# Patient Record
Sex: Male | Born: 1970
Health system: Southern US, Community
[De-identification: ages and names within clinical notes are randomized; demographics above are authoritative.]

## PROBLEM LIST (undated history)

## (undated) DIAGNOSIS — R519 Headache, unspecified: Secondary | ICD-10-CM

## (undated) DIAGNOSIS — I480 Paroxysmal atrial fibrillation: Secondary | ICD-10-CM

## (undated) DIAGNOSIS — G629 Polyneuropathy, unspecified: Secondary | ICD-10-CM

## (undated) DIAGNOSIS — T148XXA Other injury of unspecified body region, initial encounter: Secondary | ICD-10-CM

## (undated) DIAGNOSIS — M1990 Unspecified osteoarthritis, unspecified site: Secondary | ICD-10-CM

## (undated) DIAGNOSIS — I7100 Dissection of unspecified site of aorta: Secondary | ICD-10-CM

## (undated) DIAGNOSIS — I10 Essential (primary) hypertension: Secondary | ICD-10-CM

## (undated) DIAGNOSIS — N186 End stage renal disease: Secondary | ICD-10-CM

## (undated) DIAGNOSIS — G839 Paralytic syndrome, unspecified: Secondary | ICD-10-CM

## (undated) DIAGNOSIS — H5410 Blindness, one eye, low vision other eye, unspecified eyes: Secondary | ICD-10-CM

## (undated) DIAGNOSIS — Z9889 Other specified postprocedural states: Secondary | ICD-10-CM

## (undated) DIAGNOSIS — R51 Headache: Secondary | ICD-10-CM

## (undated) DIAGNOSIS — J189 Pneumonia, unspecified organism: Secondary | ICD-10-CM

## (undated) HISTORY — DX: End stage renal disease: N18.6

## (undated) HISTORY — PX: AV FISTULA PLACEMENT: SHX1204

## (undated) HISTORY — DX: Blindness, one eye, low vision other eye, unspecified eyes: H54.10

## (undated) HISTORY — PX: REPAIR OF ACUTE ASCENDING THORACIC AORTIC DISSECTION: SHX6323

## (undated) HISTORY — DX: Essential (primary) hypertension: I10

---

## 2002-04-04 DIAGNOSIS — I7100 Dissection of unspecified site of aorta: Secondary | ICD-10-CM

## 2002-04-04 HISTORY — DX: Dissection of unspecified site of aorta: I71.00

## 2012-04-04 DIAGNOSIS — Z9289 Personal history of other medical treatment: Secondary | ICD-10-CM

## 2012-04-04 DIAGNOSIS — Z9889 Other specified postprocedural states: Secondary | ICD-10-CM

## 2012-04-04 HISTORY — DX: Personal history of other medical treatment: Z92.89

## 2012-04-04 HISTORY — DX: Other specified postprocedural states: Z98.890

## 2014-04-04 DIAGNOSIS — N186 End stage renal disease: Secondary | ICD-10-CM | POA: Diagnosis not present

## 2014-04-08 DIAGNOSIS — N2581 Secondary hyperparathyroidism of renal origin: Secondary | ICD-10-CM | POA: Diagnosis not present

## 2014-04-08 DIAGNOSIS — Z23 Encounter for immunization: Secondary | ICD-10-CM | POA: Diagnosis not present

## 2014-04-08 DIAGNOSIS — D631 Anemia in chronic kidney disease: Secondary | ICD-10-CM | POA: Diagnosis not present

## 2014-04-08 DIAGNOSIS — N186 End stage renal disease: Secondary | ICD-10-CM | POA: Diagnosis not present

## 2014-04-08 DIAGNOSIS — D509 Iron deficiency anemia, unspecified: Secondary | ICD-10-CM | POA: Diagnosis not present

## 2014-04-12 DIAGNOSIS — D509 Iron deficiency anemia, unspecified: Secondary | ICD-10-CM | POA: Diagnosis not present

## 2014-04-12 DIAGNOSIS — D631 Anemia in chronic kidney disease: Secondary | ICD-10-CM | POA: Diagnosis not present

## 2014-04-12 DIAGNOSIS — Z23 Encounter for immunization: Secondary | ICD-10-CM | POA: Diagnosis not present

## 2014-04-12 DIAGNOSIS — N186 End stage renal disease: Secondary | ICD-10-CM | POA: Diagnosis not present

## 2014-04-12 DIAGNOSIS — D689 Coagulation defect, unspecified: Secondary | ICD-10-CM | POA: Diagnosis not present

## 2014-04-12 DIAGNOSIS — N2581 Secondary hyperparathyroidism of renal origin: Secondary | ICD-10-CM | POA: Diagnosis not present

## 2014-04-17 DIAGNOSIS — N186 End stage renal disease: Secondary | ICD-10-CM | POA: Diagnosis not present

## 2014-04-17 DIAGNOSIS — D631 Anemia in chronic kidney disease: Secondary | ICD-10-CM | POA: Diagnosis not present

## 2014-04-17 DIAGNOSIS — N2581 Secondary hyperparathyroidism of renal origin: Secondary | ICD-10-CM | POA: Diagnosis not present

## 2014-04-17 DIAGNOSIS — Z23 Encounter for immunization: Secondary | ICD-10-CM | POA: Diagnosis not present

## 2014-04-17 DIAGNOSIS — D509 Iron deficiency anemia, unspecified: Secondary | ICD-10-CM | POA: Diagnosis not present

## 2014-04-19 DIAGNOSIS — N186 End stage renal disease: Secondary | ICD-10-CM | POA: Diagnosis not present

## 2014-04-19 DIAGNOSIS — Z23 Encounter for immunization: Secondary | ICD-10-CM | POA: Diagnosis not present

## 2014-04-19 DIAGNOSIS — D631 Anemia in chronic kidney disease: Secondary | ICD-10-CM | POA: Diagnosis not present

## 2014-04-19 DIAGNOSIS — N2581 Secondary hyperparathyroidism of renal origin: Secondary | ICD-10-CM | POA: Diagnosis not present

## 2014-04-19 DIAGNOSIS — D509 Iron deficiency anemia, unspecified: Secondary | ICD-10-CM | POA: Diagnosis not present

## 2014-04-29 DIAGNOSIS — D509 Iron deficiency anemia, unspecified: Secondary | ICD-10-CM | POA: Diagnosis not present

## 2014-04-29 DIAGNOSIS — N2581 Secondary hyperparathyroidism of renal origin: Secondary | ICD-10-CM | POA: Diagnosis not present

## 2014-04-29 DIAGNOSIS — D631 Anemia in chronic kidney disease: Secondary | ICD-10-CM | POA: Diagnosis not present

## 2014-04-29 DIAGNOSIS — Z23 Encounter for immunization: Secondary | ICD-10-CM | POA: Diagnosis not present

## 2014-04-29 DIAGNOSIS — N186 End stage renal disease: Secondary | ICD-10-CM | POA: Diagnosis not present

## 2014-05-03 DIAGNOSIS — N2581 Secondary hyperparathyroidism of renal origin: Secondary | ICD-10-CM | POA: Diagnosis not present

## 2014-05-03 DIAGNOSIS — Z23 Encounter for immunization: Secondary | ICD-10-CM | POA: Diagnosis not present

## 2014-05-03 DIAGNOSIS — N186 End stage renal disease: Secondary | ICD-10-CM | POA: Diagnosis not present

## 2014-05-03 DIAGNOSIS — D509 Iron deficiency anemia, unspecified: Secondary | ICD-10-CM | POA: Diagnosis not present

## 2014-05-03 DIAGNOSIS — D631 Anemia in chronic kidney disease: Secondary | ICD-10-CM | POA: Diagnosis not present

## 2014-05-08 DIAGNOSIS — D509 Iron deficiency anemia, unspecified: Secondary | ICD-10-CM | POA: Diagnosis not present

## 2014-05-08 DIAGNOSIS — N2581 Secondary hyperparathyroidism of renal origin: Secondary | ICD-10-CM | POA: Diagnosis not present

## 2014-05-08 DIAGNOSIS — D631 Anemia in chronic kidney disease: Secondary | ICD-10-CM | POA: Diagnosis not present

## 2014-05-08 DIAGNOSIS — N186 End stage renal disease: Secondary | ICD-10-CM | POA: Diagnosis not present

## 2014-05-08 DIAGNOSIS — D688 Other specified coagulation defects: Secondary | ICD-10-CM | POA: Diagnosis not present

## 2014-05-10 DIAGNOSIS — D509 Iron deficiency anemia, unspecified: Secondary | ICD-10-CM | POA: Diagnosis not present

## 2014-05-10 DIAGNOSIS — N2581 Secondary hyperparathyroidism of renal origin: Secondary | ICD-10-CM | POA: Diagnosis not present

## 2014-05-10 DIAGNOSIS — N186 End stage renal disease: Secondary | ICD-10-CM | POA: Diagnosis not present

## 2014-05-10 DIAGNOSIS — D631 Anemia in chronic kidney disease: Secondary | ICD-10-CM | POA: Diagnosis not present

## 2014-05-10 DIAGNOSIS — D688 Other specified coagulation defects: Secondary | ICD-10-CM | POA: Diagnosis not present

## 2014-05-15 DIAGNOSIS — N186 End stage renal disease: Secondary | ICD-10-CM | POA: Diagnosis not present

## 2014-05-15 DIAGNOSIS — D688 Other specified coagulation defects: Secondary | ICD-10-CM | POA: Diagnosis not present

## 2014-05-15 DIAGNOSIS — D631 Anemia in chronic kidney disease: Secondary | ICD-10-CM | POA: Diagnosis not present

## 2014-05-15 DIAGNOSIS — D509 Iron deficiency anemia, unspecified: Secondary | ICD-10-CM | POA: Diagnosis not present

## 2014-05-15 DIAGNOSIS — N2581 Secondary hyperparathyroidism of renal origin: Secondary | ICD-10-CM | POA: Diagnosis not present

## 2014-05-22 DIAGNOSIS — N2581 Secondary hyperparathyroidism of renal origin: Secondary | ICD-10-CM | POA: Diagnosis not present

## 2014-05-22 DIAGNOSIS — D509 Iron deficiency anemia, unspecified: Secondary | ICD-10-CM | POA: Diagnosis not present

## 2014-05-22 DIAGNOSIS — N186 End stage renal disease: Secondary | ICD-10-CM | POA: Diagnosis not present

## 2014-05-22 DIAGNOSIS — D688 Other specified coagulation defects: Secondary | ICD-10-CM | POA: Diagnosis not present

## 2014-05-22 DIAGNOSIS — D631 Anemia in chronic kidney disease: Secondary | ICD-10-CM | POA: Diagnosis not present

## 2014-05-28 DIAGNOSIS — D509 Iron deficiency anemia, unspecified: Secondary | ICD-10-CM | POA: Diagnosis not present

## 2014-05-28 DIAGNOSIS — N2581 Secondary hyperparathyroidism of renal origin: Secondary | ICD-10-CM | POA: Diagnosis not present

## 2014-05-28 DIAGNOSIS — N186 End stage renal disease: Secondary | ICD-10-CM | POA: Diagnosis not present

## 2014-05-28 DIAGNOSIS — D688 Other specified coagulation defects: Secondary | ICD-10-CM | POA: Diagnosis not present

## 2014-05-28 DIAGNOSIS — D631 Anemia in chronic kidney disease: Secondary | ICD-10-CM | POA: Diagnosis not present

## 2014-06-03 DIAGNOSIS — N186 End stage renal disease: Secondary | ICD-10-CM | POA: Diagnosis not present

## 2014-06-04 DIAGNOSIS — N186 End stage renal disease: Secondary | ICD-10-CM | POA: Diagnosis not present

## 2014-06-04 DIAGNOSIS — N2581 Secondary hyperparathyroidism of renal origin: Secondary | ICD-10-CM | POA: Diagnosis not present

## 2014-06-04 DIAGNOSIS — Z23 Encounter for immunization: Secondary | ICD-10-CM | POA: Diagnosis not present

## 2014-06-04 DIAGNOSIS — D509 Iron deficiency anemia, unspecified: Secondary | ICD-10-CM | POA: Diagnosis not present

## 2014-06-04 DIAGNOSIS — D631 Anemia in chronic kidney disease: Secondary | ICD-10-CM | POA: Diagnosis not present

## 2014-06-07 DIAGNOSIS — D509 Iron deficiency anemia, unspecified: Secondary | ICD-10-CM | POA: Diagnosis not present

## 2014-06-07 DIAGNOSIS — N186 End stage renal disease: Secondary | ICD-10-CM | POA: Diagnosis not present

## 2014-06-07 DIAGNOSIS — N2581 Secondary hyperparathyroidism of renal origin: Secondary | ICD-10-CM | POA: Diagnosis not present

## 2014-06-07 DIAGNOSIS — D631 Anemia in chronic kidney disease: Secondary | ICD-10-CM | POA: Diagnosis not present

## 2014-06-07 DIAGNOSIS — Z23 Encounter for immunization: Secondary | ICD-10-CM | POA: Diagnosis not present

## 2014-06-10 DIAGNOSIS — Z23 Encounter for immunization: Secondary | ICD-10-CM | POA: Diagnosis not present

## 2014-06-10 DIAGNOSIS — D631 Anemia in chronic kidney disease: Secondary | ICD-10-CM | POA: Diagnosis not present

## 2014-06-10 DIAGNOSIS — N2581 Secondary hyperparathyroidism of renal origin: Secondary | ICD-10-CM | POA: Diagnosis not present

## 2014-06-10 DIAGNOSIS — N186 End stage renal disease: Secondary | ICD-10-CM | POA: Diagnosis not present

## 2014-06-10 DIAGNOSIS — D509 Iron deficiency anemia, unspecified: Secondary | ICD-10-CM | POA: Diagnosis not present

## 2014-06-14 DIAGNOSIS — N2581 Secondary hyperparathyroidism of renal origin: Secondary | ICD-10-CM | POA: Diagnosis not present

## 2014-06-14 DIAGNOSIS — Z23 Encounter for immunization: Secondary | ICD-10-CM | POA: Diagnosis not present

## 2014-06-14 DIAGNOSIS — D509 Iron deficiency anemia, unspecified: Secondary | ICD-10-CM | POA: Diagnosis not present

## 2014-06-14 DIAGNOSIS — D631 Anemia in chronic kidney disease: Secondary | ICD-10-CM | POA: Diagnosis not present

## 2014-06-14 DIAGNOSIS — N186 End stage renal disease: Secondary | ICD-10-CM | POA: Diagnosis not present

## 2014-06-21 DIAGNOSIS — D631 Anemia in chronic kidney disease: Secondary | ICD-10-CM | POA: Diagnosis not present

## 2014-06-21 DIAGNOSIS — D509 Iron deficiency anemia, unspecified: Secondary | ICD-10-CM | POA: Diagnosis not present

## 2014-06-21 DIAGNOSIS — N2581 Secondary hyperparathyroidism of renal origin: Secondary | ICD-10-CM | POA: Diagnosis not present

## 2014-06-21 DIAGNOSIS — Z23 Encounter for immunization: Secondary | ICD-10-CM | POA: Diagnosis not present

## 2014-06-21 DIAGNOSIS — N186 End stage renal disease: Secondary | ICD-10-CM | POA: Diagnosis not present

## 2014-06-26 DIAGNOSIS — N186 End stage renal disease: Secondary | ICD-10-CM | POA: Diagnosis not present

## 2014-06-26 DIAGNOSIS — Z23 Encounter for immunization: Secondary | ICD-10-CM | POA: Diagnosis not present

## 2014-06-26 DIAGNOSIS — D509 Iron deficiency anemia, unspecified: Secondary | ICD-10-CM | POA: Diagnosis not present

## 2014-06-26 DIAGNOSIS — D631 Anemia in chronic kidney disease: Secondary | ICD-10-CM | POA: Diagnosis not present

## 2014-06-26 DIAGNOSIS — N2581 Secondary hyperparathyroidism of renal origin: Secondary | ICD-10-CM | POA: Diagnosis not present

## 2014-06-28 DIAGNOSIS — N2581 Secondary hyperparathyroidism of renal origin: Secondary | ICD-10-CM | POA: Diagnosis not present

## 2014-06-28 DIAGNOSIS — Z23 Encounter for immunization: Secondary | ICD-10-CM | POA: Diagnosis not present

## 2014-06-28 DIAGNOSIS — D631 Anemia in chronic kidney disease: Secondary | ICD-10-CM | POA: Diagnosis not present

## 2014-06-28 DIAGNOSIS — N186 End stage renal disease: Secondary | ICD-10-CM | POA: Diagnosis not present

## 2014-06-28 DIAGNOSIS — D509 Iron deficiency anemia, unspecified: Secondary | ICD-10-CM | POA: Diagnosis not present

## 2014-07-03 DIAGNOSIS — N2581 Secondary hyperparathyroidism of renal origin: Secondary | ICD-10-CM | POA: Diagnosis not present

## 2014-07-03 DIAGNOSIS — D509 Iron deficiency anemia, unspecified: Secondary | ICD-10-CM | POA: Diagnosis not present

## 2014-07-03 DIAGNOSIS — D631 Anemia in chronic kidney disease: Secondary | ICD-10-CM | POA: Diagnosis not present

## 2014-07-03 DIAGNOSIS — Z23 Encounter for immunization: Secondary | ICD-10-CM | POA: Diagnosis not present

## 2014-07-03 DIAGNOSIS — N186 End stage renal disease: Secondary | ICD-10-CM | POA: Diagnosis not present

## 2014-07-05 DIAGNOSIS — N2581 Secondary hyperparathyroidism of renal origin: Secondary | ICD-10-CM | POA: Diagnosis not present

## 2014-07-05 DIAGNOSIS — D509 Iron deficiency anemia, unspecified: Secondary | ICD-10-CM | POA: Diagnosis not present

## 2014-07-05 DIAGNOSIS — N186 End stage renal disease: Secondary | ICD-10-CM | POA: Diagnosis not present

## 2014-07-10 DIAGNOSIS — N2581 Secondary hyperparathyroidism of renal origin: Secondary | ICD-10-CM | POA: Diagnosis not present

## 2014-07-10 DIAGNOSIS — N186 End stage renal disease: Secondary | ICD-10-CM | POA: Diagnosis not present

## 2014-07-10 DIAGNOSIS — D509 Iron deficiency anemia, unspecified: Secondary | ICD-10-CM | POA: Diagnosis not present

## 2014-07-15 DIAGNOSIS — J189 Pneumonia, unspecified organism: Secondary | ICD-10-CM | POA: Diagnosis not present

## 2014-07-15 DIAGNOSIS — N189 Chronic kidney disease, unspecified: Secondary | ICD-10-CM | POA: Diagnosis not present

## 2014-07-19 DIAGNOSIS — N2581 Secondary hyperparathyroidism of renal origin: Secondary | ICD-10-CM | POA: Diagnosis not present

## 2014-07-19 DIAGNOSIS — D509 Iron deficiency anemia, unspecified: Secondary | ICD-10-CM | POA: Diagnosis not present

## 2014-07-19 DIAGNOSIS — N186 End stage renal disease: Secondary | ICD-10-CM | POA: Diagnosis not present

## 2014-07-26 DIAGNOSIS — R0602 Shortness of breath: Secondary | ICD-10-CM | POA: Diagnosis not present

## 2014-07-26 DIAGNOSIS — I10 Essential (primary) hypertension: Secondary | ICD-10-CM | POA: Diagnosis not present

## 2014-07-26 DIAGNOSIS — D631 Anemia in chronic kidney disease: Secondary | ICD-10-CM | POA: Diagnosis present

## 2014-07-26 DIAGNOSIS — R29898 Other symptoms and signs involving the musculoskeletal system: Secondary | ICD-10-CM | POA: Diagnosis not present

## 2014-07-26 DIAGNOSIS — I4891 Unspecified atrial fibrillation: Secondary | ICD-10-CM | POA: Diagnosis present

## 2014-07-26 DIAGNOSIS — Z7901 Long term (current) use of anticoagulants: Secondary | ICD-10-CM | POA: Diagnosis not present

## 2014-07-26 DIAGNOSIS — G609 Hereditary and idiopathic neuropathy, unspecified: Secondary | ICD-10-CM | POA: Diagnosis present

## 2014-07-26 DIAGNOSIS — M129 Arthropathy, unspecified: Secondary | ICD-10-CM | POA: Diagnosis not present

## 2014-07-26 DIAGNOSIS — A419 Sepsis, unspecified organism: Secondary | ICD-10-CM | POA: Diagnosis present

## 2014-07-26 DIAGNOSIS — I2510 Atherosclerotic heart disease of native coronary artery without angina pectoris: Secondary | ICD-10-CM | POA: Diagnosis not present

## 2014-07-26 DIAGNOSIS — I70213 Atherosclerosis of native arteries of extremities with intermittent claudication, bilateral legs: Secondary | ICD-10-CM | POA: Diagnosis not present

## 2014-07-26 DIAGNOSIS — M2012 Hallux valgus (acquired), left foot: Secondary | ICD-10-CM | POA: Diagnosis not present

## 2014-07-26 DIAGNOSIS — N400 Enlarged prostate without lower urinary tract symptoms: Secondary | ICD-10-CM | POA: Diagnosis not present

## 2014-07-26 DIAGNOSIS — G6289 Other specified polyneuropathies: Secondary | ICD-10-CM | POA: Diagnosis not present

## 2014-07-26 DIAGNOSIS — L03031 Cellulitis of right toe: Secondary | ICD-10-CM | POA: Diagnosis not present

## 2014-07-26 DIAGNOSIS — Z9115 Patient's noncompliance with renal dialysis: Secondary | ICD-10-CM | POA: Diagnosis present

## 2014-07-26 DIAGNOSIS — J159 Unspecified bacterial pneumonia: Secondary | ICD-10-CM | POA: Diagnosis not present

## 2014-07-26 DIAGNOSIS — I779 Disorder of arteries and arterioles, unspecified: Secondary | ICD-10-CM | POA: Diagnosis present

## 2014-07-26 DIAGNOSIS — M19071 Primary osteoarthritis, right ankle and foot: Secondary | ICD-10-CM | POA: Diagnosis not present

## 2014-07-26 DIAGNOSIS — R197 Diarrhea, unspecified: Secondary | ICD-10-CM | POA: Diagnosis present

## 2014-07-26 DIAGNOSIS — M2011 Hallux valgus (acquired), right foot: Secondary | ICD-10-CM | POA: Diagnosis not present

## 2014-07-26 DIAGNOSIS — L03116 Cellulitis of left lower limb: Secondary | ICD-10-CM | POA: Diagnosis not present

## 2014-07-26 DIAGNOSIS — M19072 Primary osteoarthritis, left ankle and foot: Secondary | ICD-10-CM | POA: Diagnosis not present

## 2014-07-26 DIAGNOSIS — I482 Chronic atrial fibrillation: Secondary | ICD-10-CM | POA: Diagnosis not present

## 2014-07-26 DIAGNOSIS — I96 Gangrene, not elsewhere classified: Secondary | ICD-10-CM | POA: Diagnosis present

## 2014-07-26 DIAGNOSIS — N186 End stage renal disease: Secondary | ICD-10-CM | POA: Diagnosis not present

## 2014-07-26 DIAGNOSIS — E875 Hyperkalemia: Secondary | ICD-10-CM | POA: Diagnosis not present

## 2014-07-26 DIAGNOSIS — R55 Syncope and collapse: Secondary | ICD-10-CM | POA: Diagnosis not present

## 2014-07-26 DIAGNOSIS — R531 Weakness: Secondary | ICD-10-CM | POA: Diagnosis not present

## 2014-07-26 DIAGNOSIS — N390 Urinary tract infection, site not specified: Secondary | ICD-10-CM | POA: Diagnosis not present

## 2014-07-26 DIAGNOSIS — I998 Other disorder of circulatory system: Secondary | ICD-10-CM | POA: Diagnosis not present

## 2014-07-26 DIAGNOSIS — L03115 Cellulitis of right lower limb: Secondary | ICD-10-CM | POA: Diagnosis not present

## 2014-07-26 DIAGNOSIS — B351 Tinea unguium: Secondary | ICD-10-CM | POA: Diagnosis not present

## 2014-07-26 DIAGNOSIS — I739 Peripheral vascular disease, unspecified: Secondary | ICD-10-CM | POA: Diagnosis not present

## 2014-07-26 DIAGNOSIS — M79673 Pain in unspecified foot: Secondary | ICD-10-CM | POA: Diagnosis not present

## 2014-07-26 DIAGNOSIS — Z992 Dependence on renal dialysis: Secondary | ICD-10-CM | POA: Diagnosis not present

## 2014-07-26 DIAGNOSIS — I480 Paroxysmal atrial fibrillation: Secondary | ICD-10-CM | POA: Diagnosis not present

## 2014-08-03 DIAGNOSIS — N186 End stage renal disease: Secondary | ICD-10-CM | POA: Diagnosis not present

## 2014-08-06 DIAGNOSIS — Z992 Dependence on renal dialysis: Secondary | ICD-10-CM | POA: Diagnosis not present

## 2014-08-06 DIAGNOSIS — I480 Paroxysmal atrial fibrillation: Secondary | ICD-10-CM | POA: Diagnosis not present

## 2014-08-06 DIAGNOSIS — D631 Anemia in chronic kidney disease: Secondary | ICD-10-CM | POA: Diagnosis not present

## 2014-08-06 DIAGNOSIS — N259 Disorder resulting from impaired renal tubular function, unspecified: Secondary | ICD-10-CM | POA: Diagnosis not present

## 2014-08-06 DIAGNOSIS — I4891 Unspecified atrial fibrillation: Secondary | ICD-10-CM | POA: Diagnosis present

## 2014-08-06 DIAGNOSIS — I10 Essential (primary) hypertension: Secondary | ICD-10-CM | POA: Diagnosis not present

## 2014-08-06 DIAGNOSIS — D509 Iron deficiency anemia, unspecified: Secondary | ICD-10-CM | POA: Diagnosis not present

## 2014-08-06 DIAGNOSIS — M129 Arthropathy, unspecified: Secondary | ICD-10-CM | POA: Diagnosis not present

## 2014-08-06 DIAGNOSIS — D649 Anemia, unspecified: Secondary | ICD-10-CM | POA: Diagnosis present

## 2014-08-06 DIAGNOSIS — N2581 Secondary hyperparathyroidism of renal origin: Secondary | ICD-10-CM | POA: Diagnosis present

## 2014-08-06 DIAGNOSIS — J158 Pneumonia due to other specified bacteria: Secondary | ICD-10-CM | POA: Diagnosis present

## 2014-08-06 DIAGNOSIS — I70213 Atherosclerosis of native arteries of extremities with intermittent claudication, bilateral legs: Secondary | ICD-10-CM | POA: Diagnosis not present

## 2014-08-06 DIAGNOSIS — M79673 Pain in unspecified foot: Secondary | ICD-10-CM | POA: Diagnosis not present

## 2014-08-06 DIAGNOSIS — R0902 Hypoxemia: Secondary | ICD-10-CM | POA: Diagnosis present

## 2014-08-06 DIAGNOSIS — I770 Arteriovenous fistula, acquired: Secondary | ICD-10-CM | POA: Diagnosis not present

## 2014-08-06 DIAGNOSIS — D638 Anemia in other chronic diseases classified elsewhere: Secondary | ICD-10-CM | POA: Diagnosis not present

## 2014-08-06 DIAGNOSIS — I2510 Atherosclerotic heart disease of native coronary artery without angina pectoris: Secondary | ICD-10-CM | POA: Diagnosis not present

## 2014-08-06 DIAGNOSIS — G629 Polyneuropathy, unspecified: Secondary | ICD-10-CM | POA: Diagnosis present

## 2014-08-06 DIAGNOSIS — I482 Chronic atrial fibrillation: Secondary | ICD-10-CM | POA: Diagnosis not present

## 2014-08-06 DIAGNOSIS — L03116 Cellulitis of left lower limb: Secondary | ICD-10-CM | POA: Diagnosis not present

## 2014-08-06 DIAGNOSIS — R4182 Altered mental status, unspecified: Secondary | ICD-10-CM | POA: Diagnosis not present

## 2014-08-06 DIAGNOSIS — N186 End stage renal disease: Secondary | ICD-10-CM | POA: Diagnosis not present

## 2014-08-06 DIAGNOSIS — R55 Syncope and collapse: Secondary | ICD-10-CM | POA: Diagnosis not present

## 2014-08-06 DIAGNOSIS — L03115 Cellulitis of right lower limb: Secondary | ICD-10-CM | POA: Diagnosis not present

## 2014-08-06 DIAGNOSIS — B351 Tinea unguium: Secondary | ICD-10-CM | POA: Diagnosis not present

## 2014-08-06 DIAGNOSIS — R05 Cough: Secondary | ICD-10-CM | POA: Diagnosis not present

## 2014-08-06 DIAGNOSIS — G6289 Other specified polyneuropathies: Secondary | ICD-10-CM | POA: Diagnosis not present

## 2014-08-06 DIAGNOSIS — Z9115 Patient's noncompliance with renal dialysis: Secondary | ICD-10-CM | POA: Diagnosis present

## 2014-08-06 DIAGNOSIS — Z7901 Long term (current) use of anticoagulants: Secondary | ICD-10-CM | POA: Diagnosis not present

## 2014-08-06 DIAGNOSIS — E875 Hyperkalemia: Secondary | ICD-10-CM | POA: Diagnosis not present

## 2014-08-06 DIAGNOSIS — T82398A Other mechanical complication of other vascular grafts, initial encounter: Secondary | ICD-10-CM | POA: Diagnosis not present

## 2014-08-06 DIAGNOSIS — N2589 Other disorders resulting from impaired renal tubular function: Secondary | ICD-10-CM | POA: Diagnosis not present

## 2014-08-06 DIAGNOSIS — I120 Hypertensive chronic kidney disease with stage 5 chronic kidney disease or end stage renal disease: Secondary | ICD-10-CM | POA: Diagnosis present

## 2014-08-06 DIAGNOSIS — J159 Unspecified bacterial pneumonia: Secondary | ICD-10-CM | POA: Diagnosis not present

## 2014-08-06 DIAGNOSIS — I509 Heart failure, unspecified: Secondary | ICD-10-CM | POA: Diagnosis not present

## 2014-08-07 DIAGNOSIS — N2581 Secondary hyperparathyroidism of renal origin: Secondary | ICD-10-CM | POA: Diagnosis not present

## 2014-08-07 DIAGNOSIS — N186 End stage renal disease: Secondary | ICD-10-CM | POA: Diagnosis not present

## 2014-08-07 DIAGNOSIS — N259 Disorder resulting from impaired renal tubular function, unspecified: Secondary | ICD-10-CM | POA: Diagnosis not present

## 2014-08-07 DIAGNOSIS — D509 Iron deficiency anemia, unspecified: Secondary | ICD-10-CM | POA: Diagnosis not present

## 2014-08-07 DIAGNOSIS — D638 Anemia in other chronic diseases classified elsewhere: Secondary | ICD-10-CM | POA: Diagnosis not present

## 2014-08-07 DIAGNOSIS — D631 Anemia in chronic kidney disease: Secondary | ICD-10-CM | POA: Diagnosis not present

## 2014-08-07 DIAGNOSIS — I10 Essential (primary) hypertension: Secondary | ICD-10-CM | POA: Diagnosis not present

## 2014-08-09 DIAGNOSIS — N259 Disorder resulting from impaired renal tubular function, unspecified: Secondary | ICD-10-CM | POA: Diagnosis not present

## 2014-08-09 DIAGNOSIS — N2581 Secondary hyperparathyroidism of renal origin: Secondary | ICD-10-CM | POA: Diagnosis not present

## 2014-08-09 DIAGNOSIS — D509 Iron deficiency anemia, unspecified: Secondary | ICD-10-CM | POA: Diagnosis not present

## 2014-08-09 DIAGNOSIS — D631 Anemia in chronic kidney disease: Secondary | ICD-10-CM | POA: Diagnosis not present

## 2014-08-09 DIAGNOSIS — N186 End stage renal disease: Secondary | ICD-10-CM | POA: Diagnosis not present

## 2014-08-11 DIAGNOSIS — N186 End stage renal disease: Secondary | ICD-10-CM | POA: Diagnosis not present

## 2014-08-11 DIAGNOSIS — I10 Essential (primary) hypertension: Secondary | ICD-10-CM | POA: Diagnosis not present

## 2014-08-12 DIAGNOSIS — I770 Arteriovenous fistula, acquired: Secondary | ICD-10-CM | POA: Diagnosis not present

## 2014-08-12 DIAGNOSIS — I509 Heart failure, unspecified: Secondary | ICD-10-CM | POA: Diagnosis not present

## 2014-08-12 DIAGNOSIS — N186 End stage renal disease: Secondary | ICD-10-CM | POA: Diagnosis not present

## 2014-08-12 DIAGNOSIS — T82398A Other mechanical complication of other vascular grafts, initial encounter: Secondary | ICD-10-CM | POA: Diagnosis not present

## 2014-08-12 DIAGNOSIS — N259 Disorder resulting from impaired renal tubular function, unspecified: Secondary | ICD-10-CM | POA: Diagnosis not present

## 2014-08-12 DIAGNOSIS — D631 Anemia in chronic kidney disease: Secondary | ICD-10-CM | POA: Diagnosis not present

## 2014-08-12 DIAGNOSIS — D509 Iron deficiency anemia, unspecified: Secondary | ICD-10-CM | POA: Diagnosis not present

## 2014-08-12 DIAGNOSIS — N2581 Secondary hyperparathyroidism of renal origin: Secondary | ICD-10-CM | POA: Diagnosis not present

## 2014-08-14 DIAGNOSIS — N259 Disorder resulting from impaired renal tubular function, unspecified: Secondary | ICD-10-CM | POA: Diagnosis not present

## 2014-08-14 DIAGNOSIS — D509 Iron deficiency anemia, unspecified: Secondary | ICD-10-CM | POA: Diagnosis not present

## 2014-08-14 DIAGNOSIS — N186 End stage renal disease: Secondary | ICD-10-CM | POA: Diagnosis not present

## 2014-08-14 DIAGNOSIS — N2581 Secondary hyperparathyroidism of renal origin: Secondary | ICD-10-CM | POA: Diagnosis not present

## 2014-08-14 DIAGNOSIS — D631 Anemia in chronic kidney disease: Secondary | ICD-10-CM | POA: Diagnosis not present

## 2014-08-16 DIAGNOSIS — N2581 Secondary hyperparathyroidism of renal origin: Secondary | ICD-10-CM | POA: Diagnosis not present

## 2014-08-16 DIAGNOSIS — N259 Disorder resulting from impaired renal tubular function, unspecified: Secondary | ICD-10-CM | POA: Diagnosis not present

## 2014-08-16 DIAGNOSIS — N186 End stage renal disease: Secondary | ICD-10-CM | POA: Diagnosis not present

## 2014-08-16 DIAGNOSIS — D631 Anemia in chronic kidney disease: Secondary | ICD-10-CM | POA: Diagnosis not present

## 2014-08-16 DIAGNOSIS — D509 Iron deficiency anemia, unspecified: Secondary | ICD-10-CM | POA: Diagnosis not present

## 2014-08-19 DIAGNOSIS — N186 End stage renal disease: Secondary | ICD-10-CM | POA: Diagnosis not present

## 2014-08-19 DIAGNOSIS — D509 Iron deficiency anemia, unspecified: Secondary | ICD-10-CM | POA: Diagnosis not present

## 2014-08-19 DIAGNOSIS — N2581 Secondary hyperparathyroidism of renal origin: Secondary | ICD-10-CM | POA: Diagnosis not present

## 2014-08-19 DIAGNOSIS — N259 Disorder resulting from impaired renal tubular function, unspecified: Secondary | ICD-10-CM | POA: Diagnosis not present

## 2014-08-19 DIAGNOSIS — D631 Anemia in chronic kidney disease: Secondary | ICD-10-CM | POA: Diagnosis not present

## 2014-08-20 DIAGNOSIS — I70213 Atherosclerosis of native arteries of extremities with intermittent claudication, bilateral legs: Secondary | ICD-10-CM | POA: Diagnosis not present

## 2014-08-21 DIAGNOSIS — N186 End stage renal disease: Secondary | ICD-10-CM | POA: Diagnosis not present

## 2014-08-21 DIAGNOSIS — N259 Disorder resulting from impaired renal tubular function, unspecified: Secondary | ICD-10-CM | POA: Diagnosis not present

## 2014-08-21 DIAGNOSIS — D509 Iron deficiency anemia, unspecified: Secondary | ICD-10-CM | POA: Diagnosis not present

## 2014-08-21 DIAGNOSIS — N2581 Secondary hyperparathyroidism of renal origin: Secondary | ICD-10-CM | POA: Diagnosis not present

## 2014-08-21 DIAGNOSIS — D631 Anemia in chronic kidney disease: Secondary | ICD-10-CM | POA: Diagnosis not present

## 2014-08-23 DIAGNOSIS — N259 Disorder resulting from impaired renal tubular function, unspecified: Secondary | ICD-10-CM | POA: Diagnosis not present

## 2014-08-23 DIAGNOSIS — N186 End stage renal disease: Secondary | ICD-10-CM | POA: Diagnosis not present

## 2014-08-23 DIAGNOSIS — N2581 Secondary hyperparathyroidism of renal origin: Secondary | ICD-10-CM | POA: Diagnosis not present

## 2014-08-23 DIAGNOSIS — D631 Anemia in chronic kidney disease: Secondary | ICD-10-CM | POA: Diagnosis not present

## 2014-08-23 DIAGNOSIS — D509 Iron deficiency anemia, unspecified: Secondary | ICD-10-CM | POA: Diagnosis not present

## 2014-08-26 DIAGNOSIS — N186 End stage renal disease: Secondary | ICD-10-CM | POA: Diagnosis not present

## 2014-08-26 DIAGNOSIS — N259 Disorder resulting from impaired renal tubular function, unspecified: Secondary | ICD-10-CM | POA: Diagnosis not present

## 2014-08-26 DIAGNOSIS — D631 Anemia in chronic kidney disease: Secondary | ICD-10-CM | POA: Diagnosis not present

## 2014-08-26 DIAGNOSIS — N2581 Secondary hyperparathyroidism of renal origin: Secondary | ICD-10-CM | POA: Diagnosis not present

## 2014-08-26 DIAGNOSIS — D509 Iron deficiency anemia, unspecified: Secondary | ICD-10-CM | POA: Diagnosis not present

## 2014-08-27 DIAGNOSIS — N186 End stage renal disease: Secondary | ICD-10-CM | POA: Diagnosis not present

## 2014-08-28 DIAGNOSIS — D509 Iron deficiency anemia, unspecified: Secondary | ICD-10-CM | POA: Diagnosis not present

## 2014-08-28 DIAGNOSIS — N2581 Secondary hyperparathyroidism of renal origin: Secondary | ICD-10-CM | POA: Diagnosis not present

## 2014-08-28 DIAGNOSIS — N186 End stage renal disease: Secondary | ICD-10-CM | POA: Diagnosis not present

## 2014-08-28 DIAGNOSIS — D631 Anemia in chronic kidney disease: Secondary | ICD-10-CM | POA: Diagnosis not present

## 2014-08-28 DIAGNOSIS — N259 Disorder resulting from impaired renal tubular function, unspecified: Secondary | ICD-10-CM | POA: Diagnosis not present

## 2014-08-30 DIAGNOSIS — N2581 Secondary hyperparathyroidism of renal origin: Secondary | ICD-10-CM | POA: Diagnosis not present

## 2014-08-30 DIAGNOSIS — D631 Anemia in chronic kidney disease: Secondary | ICD-10-CM | POA: Diagnosis not present

## 2014-08-30 DIAGNOSIS — N259 Disorder resulting from impaired renal tubular function, unspecified: Secondary | ICD-10-CM | POA: Diagnosis not present

## 2014-08-30 DIAGNOSIS — D509 Iron deficiency anemia, unspecified: Secondary | ICD-10-CM | POA: Diagnosis not present

## 2014-08-30 DIAGNOSIS — N186 End stage renal disease: Secondary | ICD-10-CM | POA: Diagnosis not present

## 2014-09-02 DIAGNOSIS — D649 Anemia, unspecified: Secondary | ICD-10-CM | POA: Diagnosis present

## 2014-09-02 DIAGNOSIS — I2510 Atherosclerotic heart disease of native coronary artery without angina pectoris: Secondary | ICD-10-CM | POA: Diagnosis not present

## 2014-09-02 DIAGNOSIS — L03115 Cellulitis of right lower limb: Secondary | ICD-10-CM | POA: Diagnosis not present

## 2014-09-02 DIAGNOSIS — I4891 Unspecified atrial fibrillation: Secondary | ICD-10-CM | POA: Diagnosis not present

## 2014-09-02 DIAGNOSIS — I10 Essential (primary) hypertension: Secondary | ICD-10-CM | POA: Diagnosis not present

## 2014-09-02 DIAGNOSIS — R0902 Hypoxemia: Secondary | ICD-10-CM | POA: Diagnosis present

## 2014-09-02 DIAGNOSIS — Z992 Dependence on renal dialysis: Secondary | ICD-10-CM | POA: Diagnosis not present

## 2014-09-02 DIAGNOSIS — E875 Hyperkalemia: Secondary | ICD-10-CM | POA: Diagnosis not present

## 2014-09-02 DIAGNOSIS — N186 End stage renal disease: Secondary | ICD-10-CM | POA: Diagnosis not present

## 2014-09-02 DIAGNOSIS — R05 Cough: Secondary | ICD-10-CM | POA: Diagnosis not present

## 2014-09-02 DIAGNOSIS — I482 Chronic atrial fibrillation: Secondary | ICD-10-CM | POA: Diagnosis not present

## 2014-09-02 DIAGNOSIS — J158 Pneumonia due to other specified bacteria: Secondary | ICD-10-CM | POA: Diagnosis not present

## 2014-09-02 DIAGNOSIS — R55 Syncope and collapse: Secondary | ICD-10-CM | POA: Diagnosis not present

## 2014-09-02 DIAGNOSIS — Z9115 Patient's noncompliance with renal dialysis: Secondary | ICD-10-CM | POA: Diagnosis present

## 2014-09-02 DIAGNOSIS — N2581 Secondary hyperparathyroidism of renal origin: Secondary | ICD-10-CM | POA: Diagnosis present

## 2014-09-02 DIAGNOSIS — I770 Arteriovenous fistula, acquired: Secondary | ICD-10-CM | POA: Diagnosis not present

## 2014-09-02 DIAGNOSIS — I480 Paroxysmal atrial fibrillation: Secondary | ICD-10-CM | POA: Diagnosis not present

## 2014-09-02 DIAGNOSIS — I498 Other specified cardiac arrhythmias: Secondary | ICD-10-CM | POA: Diagnosis not present

## 2014-09-02 DIAGNOSIS — I120 Hypertensive chronic kidney disease with stage 5 chronic kidney disease or end stage renal disease: Secondary | ICD-10-CM | POA: Diagnosis not present

## 2014-09-02 DIAGNOSIS — Z7901 Long term (current) use of anticoagulants: Secondary | ICD-10-CM | POA: Diagnosis not present

## 2014-09-02 DIAGNOSIS — R918 Other nonspecific abnormal finding of lung field: Secondary | ICD-10-CM | POA: Diagnosis not present

## 2014-09-02 DIAGNOSIS — G629 Polyneuropathy, unspecified: Secondary | ICD-10-CM | POA: Diagnosis present

## 2014-09-02 DIAGNOSIS — J159 Unspecified bacterial pneumonia: Secondary | ICD-10-CM | POA: Diagnosis not present

## 2014-09-02 DIAGNOSIS — R4182 Altered mental status, unspecified: Secondary | ICD-10-CM | POA: Diagnosis not present

## 2014-09-03 DIAGNOSIS — N186 End stage renal disease: Secondary | ICD-10-CM | POA: Diagnosis not present

## 2014-09-05 DIAGNOSIS — J984 Other disorders of lung: Secondary | ICD-10-CM | POA: Diagnosis not present

## 2014-09-05 DIAGNOSIS — R2689 Other abnormalities of gait and mobility: Secondary | ICD-10-CM | POA: Diagnosis not present

## 2014-09-05 DIAGNOSIS — N2581 Secondary hyperparathyroidism of renal origin: Secondary | ICD-10-CM | POA: Diagnosis not present

## 2014-09-05 DIAGNOSIS — D649 Anemia, unspecified: Secondary | ICD-10-CM | POA: Diagnosis not present

## 2014-09-05 DIAGNOSIS — R509 Fever, unspecified: Secondary | ICD-10-CM | POA: Diagnosis not present

## 2014-09-05 DIAGNOSIS — J029 Acute pharyngitis, unspecified: Secondary | ICD-10-CM | POA: Diagnosis not present

## 2014-09-05 DIAGNOSIS — R05 Cough: Secondary | ICD-10-CM | POA: Diagnosis not present

## 2014-09-05 DIAGNOSIS — M62838 Other muscle spasm: Secondary | ICD-10-CM | POA: Diagnosis not present

## 2014-09-05 DIAGNOSIS — M549 Dorsalgia, unspecified: Secondary | ICD-10-CM | POA: Diagnosis not present

## 2014-09-05 DIAGNOSIS — N186 End stage renal disease: Secondary | ICD-10-CM | POA: Diagnosis not present

## 2014-09-05 DIAGNOSIS — A047 Enterocolitis due to Clostridium difficile: Secondary | ICD-10-CM | POA: Diagnosis present

## 2014-09-05 DIAGNOSIS — M62830 Muscle spasm of back: Secondary | ICD-10-CM | POA: Diagnosis not present

## 2014-09-05 DIAGNOSIS — R000 Tachycardia, unspecified: Secondary | ICD-10-CM | POA: Diagnosis not present

## 2014-09-05 DIAGNOSIS — R918 Other nonspecific abnormal finding of lung field: Secondary | ICD-10-CM | POA: Diagnosis not present

## 2014-09-05 DIAGNOSIS — N2589 Other disorders resulting from impaired renal tubular function: Secondary | ICD-10-CM | POA: Diagnosis not present

## 2014-09-05 DIAGNOSIS — I7100 Dissection of unspecified site of aorta: Secondary | ICD-10-CM | POA: Diagnosis not present

## 2014-09-05 DIAGNOSIS — R55 Syncope and collapse: Secondary | ICD-10-CM | POA: Diagnosis not present

## 2014-09-05 DIAGNOSIS — N259 Disorder resulting from impaired renal tubular function, unspecified: Secondary | ICD-10-CM | POA: Diagnosis not present

## 2014-09-05 DIAGNOSIS — I480 Paroxysmal atrial fibrillation: Secondary | ICD-10-CM | POA: Diagnosis not present

## 2014-09-05 DIAGNOSIS — D631 Anemia in chronic kidney disease: Secondary | ICD-10-CM | POA: Diagnosis not present

## 2014-09-05 DIAGNOSIS — I482 Chronic atrial fibrillation: Secondary | ICD-10-CM | POA: Diagnosis not present

## 2014-09-05 DIAGNOSIS — D509 Iron deficiency anemia, unspecified: Secondary | ICD-10-CM | POA: Diagnosis not present

## 2014-09-05 DIAGNOSIS — Z7901 Long term (current) use of anticoagulants: Secondary | ICD-10-CM | POA: Diagnosis not present

## 2014-09-05 DIAGNOSIS — J189 Pneumonia, unspecified organism: Secondary | ICD-10-CM | POA: Diagnosis not present

## 2014-09-05 DIAGNOSIS — L03115 Cellulitis of right lower limb: Secondary | ICD-10-CM | POA: Diagnosis not present

## 2014-09-05 DIAGNOSIS — J159 Unspecified bacterial pneumonia: Secondary | ICD-10-CM | POA: Diagnosis not present

## 2014-09-05 DIAGNOSIS — E875 Hyperkalemia: Secondary | ICD-10-CM | POA: Diagnosis not present

## 2014-09-05 DIAGNOSIS — I10 Essential (primary) hypertension: Secondary | ICD-10-CM | POA: Diagnosis not present

## 2014-09-05 DIAGNOSIS — H578 Other specified disorders of eye and adnexa: Secondary | ICD-10-CM | POA: Diagnosis not present

## 2014-09-05 DIAGNOSIS — G629 Polyneuropathy, unspecified: Secondary | ICD-10-CM | POA: Diagnosis not present

## 2014-09-05 DIAGNOSIS — I2510 Atherosclerotic heart disease of native coronary artery without angina pectoris: Secondary | ICD-10-CM | POA: Diagnosis not present

## 2014-09-05 DIAGNOSIS — M545 Low back pain: Secondary | ICD-10-CM | POA: Diagnosis not present

## 2014-09-05 DIAGNOSIS — G63 Polyneuropathy in diseases classified elsewhere: Secondary | ICD-10-CM | POA: Diagnosis not present

## 2014-09-05 DIAGNOSIS — E213 Hyperparathyroidism, unspecified: Secondary | ICD-10-CM | POA: Diagnosis not present

## 2014-09-05 DIAGNOSIS — M6281 Muscle weakness (generalized): Secondary | ICD-10-CM | POA: Diagnosis not present

## 2014-09-05 DIAGNOSIS — Z992 Dependence on renal dialysis: Secondary | ICD-10-CM | POA: Diagnosis not present

## 2014-09-05 DIAGNOSIS — F509 Eating disorder, unspecified: Secondary | ICD-10-CM | POA: Diagnosis not present

## 2014-09-05 DIAGNOSIS — R0602 Shortness of breath: Secondary | ICD-10-CM | POA: Diagnosis not present

## 2014-09-05 DIAGNOSIS — R6521 Severe sepsis with septic shock: Secondary | ICD-10-CM | POA: Diagnosis not present

## 2014-09-05 DIAGNOSIS — H547 Unspecified visual loss: Secondary | ICD-10-CM | POA: Diagnosis not present

## 2014-09-05 DIAGNOSIS — J158 Pneumonia due to other specified bacteria: Secondary | ICD-10-CM | POA: Diagnosis not present

## 2014-09-05 DIAGNOSIS — I4891 Unspecified atrial fibrillation: Secondary | ICD-10-CM | POA: Diagnosis present

## 2014-09-05 DIAGNOSIS — I120 Hypertensive chronic kidney disease with stage 5 chronic kidney disease or end stage renal disease: Secondary | ICD-10-CM | POA: Diagnosis present

## 2014-09-06 DIAGNOSIS — N186 End stage renal disease: Secondary | ICD-10-CM | POA: Diagnosis not present

## 2014-09-06 DIAGNOSIS — D631 Anemia in chronic kidney disease: Secondary | ICD-10-CM | POA: Diagnosis not present

## 2014-09-06 DIAGNOSIS — N259 Disorder resulting from impaired renal tubular function, unspecified: Secondary | ICD-10-CM | POA: Diagnosis not present

## 2014-09-06 DIAGNOSIS — N2581 Secondary hyperparathyroidism of renal origin: Secondary | ICD-10-CM | POA: Diagnosis not present

## 2014-09-06 DIAGNOSIS — D509 Iron deficiency anemia, unspecified: Secondary | ICD-10-CM | POA: Diagnosis not present

## 2014-09-08 DIAGNOSIS — R05 Cough: Secondary | ICD-10-CM | POA: Diagnosis not present

## 2014-09-08 DIAGNOSIS — D649 Anemia, unspecified: Secondary | ICD-10-CM | POA: Diagnosis not present

## 2014-09-09 DIAGNOSIS — D509 Iron deficiency anemia, unspecified: Secondary | ICD-10-CM | POA: Diagnosis not present

## 2014-09-09 DIAGNOSIS — N186 End stage renal disease: Secondary | ICD-10-CM | POA: Diagnosis not present

## 2014-09-09 DIAGNOSIS — D631 Anemia in chronic kidney disease: Secondary | ICD-10-CM | POA: Diagnosis not present

## 2014-09-09 DIAGNOSIS — N259 Disorder resulting from impaired renal tubular function, unspecified: Secondary | ICD-10-CM | POA: Diagnosis not present

## 2014-09-09 DIAGNOSIS — N2581 Secondary hyperparathyroidism of renal origin: Secondary | ICD-10-CM | POA: Diagnosis not present

## 2014-09-11 DIAGNOSIS — N186 End stage renal disease: Secondary | ICD-10-CM | POA: Diagnosis not present

## 2014-09-11 DIAGNOSIS — N2581 Secondary hyperparathyroidism of renal origin: Secondary | ICD-10-CM | POA: Diagnosis not present

## 2014-09-11 DIAGNOSIS — N259 Disorder resulting from impaired renal tubular function, unspecified: Secondary | ICD-10-CM | POA: Diagnosis not present

## 2014-09-11 DIAGNOSIS — D509 Iron deficiency anemia, unspecified: Secondary | ICD-10-CM | POA: Diagnosis not present

## 2014-09-11 DIAGNOSIS — D631 Anemia in chronic kidney disease: Secondary | ICD-10-CM | POA: Diagnosis not present

## 2014-09-13 DIAGNOSIS — N186 End stage renal disease: Secondary | ICD-10-CM | POA: Diagnosis not present

## 2014-09-13 DIAGNOSIS — D631 Anemia in chronic kidney disease: Secondary | ICD-10-CM | POA: Diagnosis not present

## 2014-09-13 DIAGNOSIS — D509 Iron deficiency anemia, unspecified: Secondary | ICD-10-CM | POA: Diagnosis not present

## 2014-09-13 DIAGNOSIS — N2581 Secondary hyperparathyroidism of renal origin: Secondary | ICD-10-CM | POA: Diagnosis not present

## 2014-09-13 DIAGNOSIS — N259 Disorder resulting from impaired renal tubular function, unspecified: Secondary | ICD-10-CM | POA: Diagnosis not present

## 2014-09-16 DIAGNOSIS — N2581 Secondary hyperparathyroidism of renal origin: Secondary | ICD-10-CM | POA: Diagnosis not present

## 2014-09-16 DIAGNOSIS — D631 Anemia in chronic kidney disease: Secondary | ICD-10-CM | POA: Diagnosis not present

## 2014-09-16 DIAGNOSIS — D509 Iron deficiency anemia, unspecified: Secondary | ICD-10-CM | POA: Diagnosis not present

## 2014-09-16 DIAGNOSIS — N259 Disorder resulting from impaired renal tubular function, unspecified: Secondary | ICD-10-CM | POA: Diagnosis not present

## 2014-09-16 DIAGNOSIS — N186 End stage renal disease: Secondary | ICD-10-CM | POA: Diagnosis not present

## 2014-09-17 DIAGNOSIS — G629 Polyneuropathy, unspecified: Secondary | ICD-10-CM | POA: Diagnosis not present

## 2014-09-17 DIAGNOSIS — M549 Dorsalgia, unspecified: Secondary | ICD-10-CM | POA: Diagnosis not present

## 2014-09-18 DIAGNOSIS — N2581 Secondary hyperparathyroidism of renal origin: Secondary | ICD-10-CM | POA: Diagnosis not present

## 2014-09-18 DIAGNOSIS — I480 Paroxysmal atrial fibrillation: Secondary | ICD-10-CM | POA: Diagnosis not present

## 2014-09-18 DIAGNOSIS — R55 Syncope and collapse: Secondary | ICD-10-CM | POA: Diagnosis not present

## 2014-09-18 DIAGNOSIS — N259 Disorder resulting from impaired renal tubular function, unspecified: Secondary | ICD-10-CM | POA: Diagnosis not present

## 2014-09-18 DIAGNOSIS — D509 Iron deficiency anemia, unspecified: Secondary | ICD-10-CM | POA: Diagnosis not present

## 2014-09-18 DIAGNOSIS — D631 Anemia in chronic kidney disease: Secondary | ICD-10-CM | POA: Diagnosis not present

## 2014-09-18 DIAGNOSIS — N186 End stage renal disease: Secondary | ICD-10-CM | POA: Diagnosis not present

## 2014-09-20 DIAGNOSIS — D631 Anemia in chronic kidney disease: Secondary | ICD-10-CM | POA: Diagnosis not present

## 2014-09-20 DIAGNOSIS — N259 Disorder resulting from impaired renal tubular function, unspecified: Secondary | ICD-10-CM | POA: Diagnosis not present

## 2014-09-20 DIAGNOSIS — N186 End stage renal disease: Secondary | ICD-10-CM | POA: Diagnosis not present

## 2014-09-20 DIAGNOSIS — N2581 Secondary hyperparathyroidism of renal origin: Secondary | ICD-10-CM | POA: Diagnosis not present

## 2014-09-20 DIAGNOSIS — D509 Iron deficiency anemia, unspecified: Secondary | ICD-10-CM | POA: Diagnosis not present

## 2014-09-23 DIAGNOSIS — N186 End stage renal disease: Secondary | ICD-10-CM | POA: Diagnosis not present

## 2014-09-23 DIAGNOSIS — D631 Anemia in chronic kidney disease: Secondary | ICD-10-CM | POA: Diagnosis not present

## 2014-09-23 DIAGNOSIS — N2581 Secondary hyperparathyroidism of renal origin: Secondary | ICD-10-CM | POA: Diagnosis not present

## 2014-09-23 DIAGNOSIS — N259 Disorder resulting from impaired renal tubular function, unspecified: Secondary | ICD-10-CM | POA: Diagnosis not present

## 2014-09-23 DIAGNOSIS — D509 Iron deficiency anemia, unspecified: Secondary | ICD-10-CM | POA: Diagnosis not present

## 2014-09-25 DIAGNOSIS — N186 End stage renal disease: Secondary | ICD-10-CM | POA: Diagnosis not present

## 2014-09-25 DIAGNOSIS — N259 Disorder resulting from impaired renal tubular function, unspecified: Secondary | ICD-10-CM | POA: Diagnosis not present

## 2014-09-25 DIAGNOSIS — N2581 Secondary hyperparathyroidism of renal origin: Secondary | ICD-10-CM | POA: Diagnosis not present

## 2014-09-25 DIAGNOSIS — D509 Iron deficiency anemia, unspecified: Secondary | ICD-10-CM | POA: Diagnosis not present

## 2014-09-25 DIAGNOSIS — D631 Anemia in chronic kidney disease: Secondary | ICD-10-CM | POA: Diagnosis not present

## 2014-09-26 DIAGNOSIS — I480 Paroxysmal atrial fibrillation: Secondary | ICD-10-CM | POA: Diagnosis not present

## 2014-09-26 DIAGNOSIS — L03115 Cellulitis of right lower limb: Secondary | ICD-10-CM | POA: Diagnosis not present

## 2014-09-26 DIAGNOSIS — N186 End stage renal disease: Secondary | ICD-10-CM | POA: Diagnosis not present

## 2014-09-26 DIAGNOSIS — R55 Syncope and collapse: Secondary | ICD-10-CM | POA: Diagnosis not present

## 2014-09-27 DIAGNOSIS — N186 End stage renal disease: Secondary | ICD-10-CM | POA: Diagnosis not present

## 2014-09-27 DIAGNOSIS — D509 Iron deficiency anemia, unspecified: Secondary | ICD-10-CM | POA: Diagnosis not present

## 2014-09-27 DIAGNOSIS — D631 Anemia in chronic kidney disease: Secondary | ICD-10-CM | POA: Diagnosis not present

## 2014-09-27 DIAGNOSIS — N259 Disorder resulting from impaired renal tubular function, unspecified: Secondary | ICD-10-CM | POA: Diagnosis not present

## 2014-09-27 DIAGNOSIS — N2581 Secondary hyperparathyroidism of renal origin: Secondary | ICD-10-CM | POA: Diagnosis not present

## 2014-09-29 DIAGNOSIS — I4891 Unspecified atrial fibrillation: Secondary | ICD-10-CM | POA: Diagnosis not present

## 2014-09-29 DIAGNOSIS — I120 Hypertensive chronic kidney disease with stage 5 chronic kidney disease or end stage renal disease: Secondary | ICD-10-CM | POA: Diagnosis not present

## 2014-09-29 DIAGNOSIS — N186 End stage renal disease: Secondary | ICD-10-CM | POA: Diagnosis not present

## 2014-09-29 DIAGNOSIS — E213 Hyperparathyroidism, unspecified: Secondary | ICD-10-CM | POA: Diagnosis not present

## 2014-09-29 DIAGNOSIS — I7100 Dissection of unspecified site of aorta: Secondary | ICD-10-CM | POA: Diagnosis not present

## 2014-09-30 DIAGNOSIS — D509 Iron deficiency anemia, unspecified: Secondary | ICD-10-CM | POA: Diagnosis not present

## 2014-09-30 DIAGNOSIS — N259 Disorder resulting from impaired renal tubular function, unspecified: Secondary | ICD-10-CM | POA: Diagnosis not present

## 2014-09-30 DIAGNOSIS — N186 End stage renal disease: Secondary | ICD-10-CM | POA: Diagnosis not present

## 2014-09-30 DIAGNOSIS — D631 Anemia in chronic kidney disease: Secondary | ICD-10-CM | POA: Diagnosis not present

## 2014-09-30 DIAGNOSIS — N2581 Secondary hyperparathyroidism of renal origin: Secondary | ICD-10-CM | POA: Diagnosis not present

## 2014-10-02 DIAGNOSIS — L03115 Cellulitis of right lower limb: Secondary | ICD-10-CM | POA: Diagnosis not present

## 2014-10-02 DIAGNOSIS — D631 Anemia in chronic kidney disease: Secondary | ICD-10-CM | POA: Diagnosis not present

## 2014-10-02 DIAGNOSIS — I480 Paroxysmal atrial fibrillation: Secondary | ICD-10-CM | POA: Diagnosis not present

## 2014-10-02 DIAGNOSIS — D509 Iron deficiency anemia, unspecified: Secondary | ICD-10-CM | POA: Diagnosis not present

## 2014-10-02 DIAGNOSIS — N186 End stage renal disease: Secondary | ICD-10-CM | POA: Diagnosis not present

## 2014-10-02 DIAGNOSIS — N2581 Secondary hyperparathyroidism of renal origin: Secondary | ICD-10-CM | POA: Diagnosis not present

## 2014-10-02 DIAGNOSIS — N259 Disorder resulting from impaired renal tubular function, unspecified: Secondary | ICD-10-CM | POA: Diagnosis not present

## 2014-10-03 DIAGNOSIS — N186 End stage renal disease: Secondary | ICD-10-CM | POA: Diagnosis not present

## 2014-10-04 DIAGNOSIS — N186 End stage renal disease: Secondary | ICD-10-CM | POA: Diagnosis not present

## 2014-10-04 DIAGNOSIS — N2581 Secondary hyperparathyroidism of renal origin: Secondary | ICD-10-CM | POA: Diagnosis not present

## 2014-10-04 DIAGNOSIS — D631 Anemia in chronic kidney disease: Secondary | ICD-10-CM | POA: Diagnosis not present

## 2014-10-04 DIAGNOSIS — N2589 Other disorders resulting from impaired renal tubular function: Secondary | ICD-10-CM | POA: Diagnosis not present

## 2014-10-04 DIAGNOSIS — N259 Disorder resulting from impaired renal tubular function, unspecified: Secondary | ICD-10-CM | POA: Diagnosis not present

## 2014-10-04 DIAGNOSIS — F509 Eating disorder, unspecified: Secondary | ICD-10-CM | POA: Diagnosis not present

## 2014-10-07 DIAGNOSIS — N186 End stage renal disease: Secondary | ICD-10-CM | POA: Diagnosis not present

## 2014-10-07 DIAGNOSIS — D631 Anemia in chronic kidney disease: Secondary | ICD-10-CM | POA: Diagnosis not present

## 2014-10-07 DIAGNOSIS — N259 Disorder resulting from impaired renal tubular function, unspecified: Secondary | ICD-10-CM | POA: Diagnosis not present

## 2014-10-07 DIAGNOSIS — N2581 Secondary hyperparathyroidism of renal origin: Secondary | ICD-10-CM | POA: Diagnosis not present

## 2014-10-07 DIAGNOSIS — F509 Eating disorder, unspecified: Secondary | ICD-10-CM | POA: Diagnosis not present

## 2014-10-07 DIAGNOSIS — N2589 Other disorders resulting from impaired renal tubular function: Secondary | ICD-10-CM | POA: Diagnosis not present

## 2014-10-09 DIAGNOSIS — I480 Paroxysmal atrial fibrillation: Secondary | ICD-10-CM | POA: Diagnosis not present

## 2014-10-09 DIAGNOSIS — N2589 Other disorders resulting from impaired renal tubular function: Secondary | ICD-10-CM | POA: Diagnosis not present

## 2014-10-09 DIAGNOSIS — F509 Eating disorder, unspecified: Secondary | ICD-10-CM | POA: Diagnosis not present

## 2014-10-09 DIAGNOSIS — D631 Anemia in chronic kidney disease: Secondary | ICD-10-CM | POA: Diagnosis not present

## 2014-10-09 DIAGNOSIS — N186 End stage renal disease: Secondary | ICD-10-CM | POA: Diagnosis not present

## 2014-10-09 DIAGNOSIS — L03115 Cellulitis of right lower limb: Secondary | ICD-10-CM | POA: Diagnosis not present

## 2014-10-09 DIAGNOSIS — R55 Syncope and collapse: Secondary | ICD-10-CM | POA: Diagnosis not present

## 2014-10-09 DIAGNOSIS — N2581 Secondary hyperparathyroidism of renal origin: Secondary | ICD-10-CM | POA: Diagnosis not present

## 2014-10-09 DIAGNOSIS — N259 Disorder resulting from impaired renal tubular function, unspecified: Secondary | ICD-10-CM | POA: Diagnosis not present

## 2014-10-11 DIAGNOSIS — N259 Disorder resulting from impaired renal tubular function, unspecified: Secondary | ICD-10-CM | POA: Diagnosis not present

## 2014-10-11 DIAGNOSIS — D631 Anemia in chronic kidney disease: Secondary | ICD-10-CM | POA: Diagnosis not present

## 2014-10-11 DIAGNOSIS — N2581 Secondary hyperparathyroidism of renal origin: Secondary | ICD-10-CM | POA: Diagnosis not present

## 2014-10-11 DIAGNOSIS — N2589 Other disorders resulting from impaired renal tubular function: Secondary | ICD-10-CM | POA: Diagnosis not present

## 2014-10-11 DIAGNOSIS — N186 End stage renal disease: Secondary | ICD-10-CM | POA: Diagnosis not present

## 2014-10-11 DIAGNOSIS — F509 Eating disorder, unspecified: Secondary | ICD-10-CM | POA: Diagnosis not present

## 2014-10-13 DIAGNOSIS — J029 Acute pharyngitis, unspecified: Secondary | ICD-10-CM | POA: Diagnosis not present

## 2014-10-13 DIAGNOSIS — R05 Cough: Secondary | ICD-10-CM | POA: Diagnosis not present

## 2014-10-14 DIAGNOSIS — N2589 Other disorders resulting from impaired renal tubular function: Secondary | ICD-10-CM | POA: Diagnosis not present

## 2014-10-14 DIAGNOSIS — N186 End stage renal disease: Secondary | ICD-10-CM | POA: Diagnosis not present

## 2014-10-14 DIAGNOSIS — D631 Anemia in chronic kidney disease: Secondary | ICD-10-CM | POA: Diagnosis not present

## 2014-10-14 DIAGNOSIS — N259 Disorder resulting from impaired renal tubular function, unspecified: Secondary | ICD-10-CM | POA: Diagnosis not present

## 2014-10-14 DIAGNOSIS — F509 Eating disorder, unspecified: Secondary | ICD-10-CM | POA: Diagnosis not present

## 2014-10-14 DIAGNOSIS — N2581 Secondary hyperparathyroidism of renal origin: Secondary | ICD-10-CM | POA: Diagnosis not present

## 2014-10-16 DIAGNOSIS — D631 Anemia in chronic kidney disease: Secondary | ICD-10-CM | POA: Diagnosis not present

## 2014-10-16 DIAGNOSIS — N2581 Secondary hyperparathyroidism of renal origin: Secondary | ICD-10-CM | POA: Diagnosis not present

## 2014-10-16 DIAGNOSIS — N259 Disorder resulting from impaired renal tubular function, unspecified: Secondary | ICD-10-CM | POA: Diagnosis not present

## 2014-10-16 DIAGNOSIS — N2589 Other disorders resulting from impaired renal tubular function: Secondary | ICD-10-CM | POA: Diagnosis not present

## 2014-10-16 DIAGNOSIS — N186 End stage renal disease: Secondary | ICD-10-CM | POA: Diagnosis not present

## 2014-10-16 DIAGNOSIS — F509 Eating disorder, unspecified: Secondary | ICD-10-CM | POA: Diagnosis not present

## 2014-10-17 DIAGNOSIS — M549 Dorsalgia, unspecified: Secondary | ICD-10-CM | POA: Diagnosis not present

## 2014-10-17 DIAGNOSIS — G629 Polyneuropathy, unspecified: Secondary | ICD-10-CM | POA: Diagnosis not present

## 2014-10-18 DIAGNOSIS — F509 Eating disorder, unspecified: Secondary | ICD-10-CM | POA: Diagnosis not present

## 2014-10-18 DIAGNOSIS — N259 Disorder resulting from impaired renal tubular function, unspecified: Secondary | ICD-10-CM | POA: Diagnosis not present

## 2014-10-18 DIAGNOSIS — N186 End stage renal disease: Secondary | ICD-10-CM | POA: Diagnosis not present

## 2014-10-18 DIAGNOSIS — N2581 Secondary hyperparathyroidism of renal origin: Secondary | ICD-10-CM | POA: Diagnosis not present

## 2014-10-18 DIAGNOSIS — D631 Anemia in chronic kidney disease: Secondary | ICD-10-CM | POA: Diagnosis not present

## 2014-10-18 DIAGNOSIS — N2589 Other disorders resulting from impaired renal tubular function: Secondary | ICD-10-CM | POA: Diagnosis not present

## 2014-10-20 DIAGNOSIS — R2689 Other abnormalities of gait and mobility: Secondary | ICD-10-CM | POA: Diagnosis not present

## 2014-10-20 DIAGNOSIS — Z992 Dependence on renal dialysis: Secondary | ICD-10-CM | POA: Diagnosis not present

## 2014-10-20 DIAGNOSIS — M62830 Muscle spasm of back: Secondary | ICD-10-CM | POA: Diagnosis not present

## 2014-10-20 DIAGNOSIS — M545 Low back pain: Secondary | ICD-10-CM | POA: Diagnosis not present

## 2014-10-20 DIAGNOSIS — M6281 Muscle weakness (generalized): Secondary | ICD-10-CM | POA: Diagnosis not present

## 2014-10-20 DIAGNOSIS — G63 Polyneuropathy in diseases classified elsewhere: Secondary | ICD-10-CM | POA: Diagnosis not present

## 2014-10-20 DIAGNOSIS — N186 End stage renal disease: Secondary | ICD-10-CM | POA: Diagnosis not present

## 2014-10-21 DIAGNOSIS — N2589 Other disorders resulting from impaired renal tubular function: Secondary | ICD-10-CM | POA: Diagnosis not present

## 2014-10-21 DIAGNOSIS — F509 Eating disorder, unspecified: Secondary | ICD-10-CM | POA: Diagnosis not present

## 2014-10-21 DIAGNOSIS — N186 End stage renal disease: Secondary | ICD-10-CM | POA: Diagnosis not present

## 2014-10-21 DIAGNOSIS — N259 Disorder resulting from impaired renal tubular function, unspecified: Secondary | ICD-10-CM | POA: Diagnosis not present

## 2014-10-21 DIAGNOSIS — D631 Anemia in chronic kidney disease: Secondary | ICD-10-CM | POA: Diagnosis not present

## 2014-10-21 DIAGNOSIS — N2581 Secondary hyperparathyroidism of renal origin: Secondary | ICD-10-CM | POA: Diagnosis not present

## 2014-10-23 DIAGNOSIS — G629 Polyneuropathy, unspecified: Secondary | ICD-10-CM | POA: Diagnosis not present

## 2014-10-23 DIAGNOSIS — M549 Dorsalgia, unspecified: Secondary | ICD-10-CM | POA: Diagnosis not present

## 2014-10-23 DIAGNOSIS — N2589 Other disorders resulting from impaired renal tubular function: Secondary | ICD-10-CM | POA: Diagnosis not present

## 2014-10-23 DIAGNOSIS — M545 Low back pain: Secondary | ICD-10-CM | POA: Diagnosis not present

## 2014-10-23 DIAGNOSIS — Z992 Dependence on renal dialysis: Secondary | ICD-10-CM | POA: Diagnosis not present

## 2014-10-23 DIAGNOSIS — D631 Anemia in chronic kidney disease: Secondary | ICD-10-CM | POA: Diagnosis not present

## 2014-10-23 DIAGNOSIS — N2581 Secondary hyperparathyroidism of renal origin: Secondary | ICD-10-CM | POA: Diagnosis not present

## 2014-10-23 DIAGNOSIS — N186 End stage renal disease: Secondary | ICD-10-CM | POA: Diagnosis not present

## 2014-10-23 DIAGNOSIS — M62830 Muscle spasm of back: Secondary | ICD-10-CM | POA: Diagnosis not present

## 2014-10-23 DIAGNOSIS — G63 Polyneuropathy in diseases classified elsewhere: Secondary | ICD-10-CM | POA: Diagnosis not present

## 2014-10-23 DIAGNOSIS — N259 Disorder resulting from impaired renal tubular function, unspecified: Secondary | ICD-10-CM | POA: Diagnosis not present

## 2014-10-23 DIAGNOSIS — M6281 Muscle weakness (generalized): Secondary | ICD-10-CM | POA: Diagnosis not present

## 2014-10-23 DIAGNOSIS — F509 Eating disorder, unspecified: Secondary | ICD-10-CM | POA: Diagnosis not present

## 2014-10-23 DIAGNOSIS — R2689 Other abnormalities of gait and mobility: Secondary | ICD-10-CM | POA: Diagnosis not present

## 2014-10-25 DIAGNOSIS — N2589 Other disorders resulting from impaired renal tubular function: Secondary | ICD-10-CM | POA: Diagnosis not present

## 2014-10-25 DIAGNOSIS — D631 Anemia in chronic kidney disease: Secondary | ICD-10-CM | POA: Diagnosis not present

## 2014-10-25 DIAGNOSIS — N186 End stage renal disease: Secondary | ICD-10-CM | POA: Diagnosis not present

## 2014-10-25 DIAGNOSIS — F509 Eating disorder, unspecified: Secondary | ICD-10-CM | POA: Diagnosis not present

## 2014-10-25 DIAGNOSIS — N2581 Secondary hyperparathyroidism of renal origin: Secondary | ICD-10-CM | POA: Diagnosis not present

## 2014-10-25 DIAGNOSIS — N259 Disorder resulting from impaired renal tubular function, unspecified: Secondary | ICD-10-CM | POA: Diagnosis not present

## 2014-10-27 DIAGNOSIS — E213 Hyperparathyroidism, unspecified: Secondary | ICD-10-CM | POA: Diagnosis not present

## 2014-10-27 DIAGNOSIS — I120 Hypertensive chronic kidney disease with stage 5 chronic kidney disease or end stage renal disease: Secondary | ICD-10-CM | POA: Diagnosis not present

## 2014-10-27 DIAGNOSIS — I7100 Dissection of unspecified site of aorta: Secondary | ICD-10-CM | POA: Diagnosis not present

## 2014-10-27 DIAGNOSIS — I4891 Unspecified atrial fibrillation: Secondary | ICD-10-CM | POA: Diagnosis not present

## 2014-10-27 DIAGNOSIS — N186 End stage renal disease: Secondary | ICD-10-CM | POA: Diagnosis not present

## 2014-10-28 DIAGNOSIS — N259 Disorder resulting from impaired renal tubular function, unspecified: Secondary | ICD-10-CM | POA: Diagnosis not present

## 2014-10-28 DIAGNOSIS — D631 Anemia in chronic kidney disease: Secondary | ICD-10-CM | POA: Diagnosis not present

## 2014-10-28 DIAGNOSIS — N186 End stage renal disease: Secondary | ICD-10-CM | POA: Diagnosis not present

## 2014-10-28 DIAGNOSIS — F509 Eating disorder, unspecified: Secondary | ICD-10-CM | POA: Diagnosis not present

## 2014-10-28 DIAGNOSIS — N2581 Secondary hyperparathyroidism of renal origin: Secondary | ICD-10-CM | POA: Diagnosis not present

## 2014-10-28 DIAGNOSIS — N2589 Other disorders resulting from impaired renal tubular function: Secondary | ICD-10-CM | POA: Diagnosis not present

## 2014-10-30 DIAGNOSIS — N2581 Secondary hyperparathyroidism of renal origin: Secondary | ICD-10-CM | POA: Diagnosis not present

## 2014-10-30 DIAGNOSIS — D631 Anemia in chronic kidney disease: Secondary | ICD-10-CM | POA: Diagnosis not present

## 2014-10-30 DIAGNOSIS — N186 End stage renal disease: Secondary | ICD-10-CM | POA: Diagnosis not present

## 2014-10-30 DIAGNOSIS — N2589 Other disorders resulting from impaired renal tubular function: Secondary | ICD-10-CM | POA: Diagnosis not present

## 2014-10-30 DIAGNOSIS — F509 Eating disorder, unspecified: Secondary | ICD-10-CM | POA: Diagnosis not present

## 2014-10-30 DIAGNOSIS — N259 Disorder resulting from impaired renal tubular function, unspecified: Secondary | ICD-10-CM | POA: Diagnosis not present

## 2014-11-01 DIAGNOSIS — N2589 Other disorders resulting from impaired renal tubular function: Secondary | ICD-10-CM | POA: Diagnosis not present

## 2014-11-01 DIAGNOSIS — D631 Anemia in chronic kidney disease: Secondary | ICD-10-CM | POA: Diagnosis not present

## 2014-11-01 DIAGNOSIS — N186 End stage renal disease: Secondary | ICD-10-CM | POA: Diagnosis not present

## 2014-11-01 DIAGNOSIS — N2581 Secondary hyperparathyroidism of renal origin: Secondary | ICD-10-CM | POA: Diagnosis not present

## 2014-11-01 DIAGNOSIS — F509 Eating disorder, unspecified: Secondary | ICD-10-CM | POA: Diagnosis not present

## 2014-11-01 DIAGNOSIS — N259 Disorder resulting from impaired renal tubular function, unspecified: Secondary | ICD-10-CM | POA: Diagnosis not present

## 2014-11-03 DIAGNOSIS — N186 End stage renal disease: Secondary | ICD-10-CM | POA: Diagnosis not present

## 2014-11-04 DIAGNOSIS — D509 Iron deficiency anemia, unspecified: Secondary | ICD-10-CM | POA: Diagnosis not present

## 2014-11-04 DIAGNOSIS — N259 Disorder resulting from impaired renal tubular function, unspecified: Secondary | ICD-10-CM | POA: Diagnosis not present

## 2014-11-04 DIAGNOSIS — N186 End stage renal disease: Secondary | ICD-10-CM | POA: Diagnosis not present

## 2014-11-04 DIAGNOSIS — E875 Hyperkalemia: Secondary | ICD-10-CM | POA: Diagnosis not present

## 2014-11-04 DIAGNOSIS — N2589 Other disorders resulting from impaired renal tubular function: Secondary | ICD-10-CM | POA: Diagnosis not present

## 2014-11-06 DIAGNOSIS — N2589 Other disorders resulting from impaired renal tubular function: Secondary | ICD-10-CM | POA: Diagnosis not present

## 2014-11-06 DIAGNOSIS — H578 Other specified disorders of eye and adnexa: Secondary | ICD-10-CM | POA: Diagnosis not present

## 2014-11-06 DIAGNOSIS — M62838 Other muscle spasm: Secondary | ICD-10-CM | POA: Diagnosis not present

## 2014-11-06 DIAGNOSIS — H547 Unspecified visual loss: Secondary | ICD-10-CM | POA: Diagnosis not present

## 2014-11-06 DIAGNOSIS — E875 Hyperkalemia: Secondary | ICD-10-CM | POA: Diagnosis not present

## 2014-11-06 DIAGNOSIS — D509 Iron deficiency anemia, unspecified: Secondary | ICD-10-CM | POA: Diagnosis not present

## 2014-11-06 DIAGNOSIS — N259 Disorder resulting from impaired renal tubular function, unspecified: Secondary | ICD-10-CM | POA: Diagnosis not present

## 2014-11-06 DIAGNOSIS — N186 End stage renal disease: Secondary | ICD-10-CM | POA: Diagnosis not present

## 2014-11-06 DIAGNOSIS — G629 Polyneuropathy, unspecified: Secondary | ICD-10-CM | POA: Diagnosis not present

## 2014-11-08 DIAGNOSIS — N186 End stage renal disease: Secondary | ICD-10-CM | POA: Diagnosis not present

## 2014-11-08 DIAGNOSIS — D509 Iron deficiency anemia, unspecified: Secondary | ICD-10-CM | POA: Diagnosis not present

## 2014-11-08 DIAGNOSIS — E875 Hyperkalemia: Secondary | ICD-10-CM | POA: Diagnosis not present

## 2014-11-08 DIAGNOSIS — N2589 Other disorders resulting from impaired renal tubular function: Secondary | ICD-10-CM | POA: Diagnosis not present

## 2014-11-08 DIAGNOSIS — N259 Disorder resulting from impaired renal tubular function, unspecified: Secondary | ICD-10-CM | POA: Diagnosis not present

## 2014-11-10 DIAGNOSIS — J984 Other disorders of lung: Secondary | ICD-10-CM | POA: Diagnosis not present

## 2014-11-11 DIAGNOSIS — L03115 Cellulitis of right lower limb: Secondary | ICD-10-CM | POA: Diagnosis not present

## 2014-11-11 DIAGNOSIS — D509 Iron deficiency anemia, unspecified: Secondary | ICD-10-CM | POA: Diagnosis not present

## 2014-11-11 DIAGNOSIS — I10 Essential (primary) hypertension: Secondary | ICD-10-CM | POA: Diagnosis not present

## 2014-11-11 DIAGNOSIS — R918 Other nonspecific abnormal finding of lung field: Secondary | ICD-10-CM | POA: Diagnosis not present

## 2014-11-11 DIAGNOSIS — R55 Syncope and collapse: Secondary | ICD-10-CM | POA: Diagnosis not present

## 2014-11-11 DIAGNOSIS — Z7901 Long term (current) use of anticoagulants: Secondary | ICD-10-CM | POA: Diagnosis not present

## 2014-11-11 DIAGNOSIS — R6521 Severe sepsis with septic shock: Secondary | ICD-10-CM | POA: Diagnosis not present

## 2014-11-11 DIAGNOSIS — L03031 Cellulitis of right toe: Secondary | ICD-10-CM | POA: Diagnosis not present

## 2014-11-11 DIAGNOSIS — I480 Paroxysmal atrial fibrillation: Secondary | ICD-10-CM | POA: Diagnosis not present

## 2014-11-11 DIAGNOSIS — J159 Unspecified bacterial pneumonia: Secondary | ICD-10-CM | POA: Diagnosis not present

## 2014-11-11 DIAGNOSIS — I2510 Atherosclerotic heart disease of native coronary artery without angina pectoris: Secondary | ICD-10-CM | POA: Diagnosis not present

## 2014-11-11 DIAGNOSIS — N186 End stage renal disease: Secondary | ICD-10-CM | POA: Diagnosis not present

## 2014-11-11 DIAGNOSIS — R509 Fever, unspecified: Secondary | ICD-10-CM | POA: Diagnosis not present

## 2014-11-11 DIAGNOSIS — R000 Tachycardia, unspecified: Secondary | ICD-10-CM | POA: Diagnosis not present

## 2014-11-11 DIAGNOSIS — J189 Pneumonia, unspecified organism: Secondary | ICD-10-CM | POA: Diagnosis not present

## 2014-11-11 DIAGNOSIS — J158 Pneumonia due to other specified bacteria: Secondary | ICD-10-CM | POA: Diagnosis not present

## 2014-11-11 DIAGNOSIS — E875 Hyperkalemia: Secondary | ICD-10-CM | POA: Diagnosis not present

## 2014-11-11 DIAGNOSIS — R0602 Shortness of breath: Secondary | ICD-10-CM | POA: Diagnosis not present

## 2014-11-11 DIAGNOSIS — A419 Sepsis, unspecified organism: Secondary | ICD-10-CM | POA: Diagnosis not present

## 2014-11-11 DIAGNOSIS — R05 Cough: Secondary | ICD-10-CM | POA: Diagnosis not present

## 2014-11-11 DIAGNOSIS — G629 Polyneuropathy, unspecified: Secondary | ICD-10-CM | POA: Diagnosis not present

## 2014-11-11 DIAGNOSIS — N2589 Other disorders resulting from impaired renal tubular function: Secondary | ICD-10-CM | POA: Diagnosis not present

## 2014-11-11 DIAGNOSIS — N259 Disorder resulting from impaired renal tubular function, unspecified: Secondary | ICD-10-CM | POA: Diagnosis not present

## 2014-11-11 DIAGNOSIS — I120 Hypertensive chronic kidney disease with stage 5 chronic kidney disease or end stage renal disease: Secondary | ICD-10-CM | POA: Diagnosis not present

## 2014-11-11 DIAGNOSIS — A047 Enterocolitis due to Clostridium difficile: Secondary | ICD-10-CM | POA: Diagnosis not present

## 2014-11-11 DIAGNOSIS — I4891 Unspecified atrial fibrillation: Secondary | ICD-10-CM | POA: Diagnosis present

## 2014-11-11 DIAGNOSIS — I482 Chronic atrial fibrillation: Secondary | ICD-10-CM | POA: Diagnosis not present

## 2014-11-11 DIAGNOSIS — Z992 Dependence on renal dialysis: Secondary | ICD-10-CM | POA: Diagnosis not present

## 2014-11-17 DIAGNOSIS — N186 End stage renal disease: Secondary | ICD-10-CM | POA: Diagnosis not present

## 2014-11-17 DIAGNOSIS — I4891 Unspecified atrial fibrillation: Secondary | ICD-10-CM | POA: Diagnosis not present

## 2014-11-17 DIAGNOSIS — N2589 Other disorders resulting from impaired renal tubular function: Secondary | ICD-10-CM | POA: Diagnosis not present

## 2014-11-17 DIAGNOSIS — L03115 Cellulitis of right lower limb: Secondary | ICD-10-CM | POA: Diagnosis not present

## 2014-11-17 DIAGNOSIS — E875 Hyperkalemia: Secondary | ICD-10-CM | POA: Diagnosis not present

## 2014-11-17 DIAGNOSIS — I10 Essential (primary) hypertension: Secondary | ICD-10-CM | POA: Diagnosis not present

## 2014-11-17 DIAGNOSIS — I480 Paroxysmal atrial fibrillation: Secondary | ICD-10-CM | POA: Diagnosis not present

## 2014-11-17 DIAGNOSIS — N259 Disorder resulting from impaired renal tubular function, unspecified: Secondary | ICD-10-CM | POA: Diagnosis not present

## 2014-11-17 DIAGNOSIS — A047 Enterocolitis due to Clostridium difficile: Secondary | ICD-10-CM | POA: Diagnosis not present

## 2014-11-17 DIAGNOSIS — D509 Iron deficiency anemia, unspecified: Secondary | ICD-10-CM | POA: Diagnosis not present

## 2014-11-17 DIAGNOSIS — J159 Unspecified bacterial pneumonia: Secondary | ICD-10-CM | POA: Diagnosis not present

## 2014-11-17 DIAGNOSIS — L03031 Cellulitis of right toe: Secondary | ICD-10-CM | POA: Diagnosis not present

## 2014-11-17 DIAGNOSIS — I482 Chronic atrial fibrillation: Secondary | ICD-10-CM | POA: Diagnosis not present

## 2014-11-17 DIAGNOSIS — R55 Syncope and collapse: Secondary | ICD-10-CM | POA: Diagnosis not present

## 2014-11-17 DIAGNOSIS — I120 Hypertensive chronic kidney disease with stage 5 chronic kidney disease or end stage renal disease: Secondary | ICD-10-CM | POA: Diagnosis not present

## 2014-11-18 DIAGNOSIS — E875 Hyperkalemia: Secondary | ICD-10-CM | POA: Diagnosis not present

## 2014-11-18 DIAGNOSIS — I4891 Unspecified atrial fibrillation: Secondary | ICD-10-CM | POA: Diagnosis not present

## 2014-11-18 DIAGNOSIS — N2589 Other disorders resulting from impaired renal tubular function: Secondary | ICD-10-CM | POA: Diagnosis not present

## 2014-11-18 DIAGNOSIS — D509 Iron deficiency anemia, unspecified: Secondary | ICD-10-CM | POA: Diagnosis not present

## 2014-11-18 DIAGNOSIS — N186 End stage renal disease: Secondary | ICD-10-CM | POA: Diagnosis not present

## 2014-11-18 DIAGNOSIS — A047 Enterocolitis due to Clostridium difficile: Secondary | ICD-10-CM | POA: Diagnosis not present

## 2014-11-18 DIAGNOSIS — N259 Disorder resulting from impaired renal tubular function, unspecified: Secondary | ICD-10-CM | POA: Diagnosis not present

## 2014-11-18 DIAGNOSIS — I120 Hypertensive chronic kidney disease with stage 5 chronic kidney disease or end stage renal disease: Secondary | ICD-10-CM | POA: Diagnosis not present

## 2014-11-20 DIAGNOSIS — D509 Iron deficiency anemia, unspecified: Secondary | ICD-10-CM | POA: Diagnosis not present

## 2014-11-20 DIAGNOSIS — N186 End stage renal disease: Secondary | ICD-10-CM | POA: Diagnosis not present

## 2014-11-20 DIAGNOSIS — E875 Hyperkalemia: Secondary | ICD-10-CM | POA: Diagnosis not present

## 2014-11-20 DIAGNOSIS — N2589 Other disorders resulting from impaired renal tubular function: Secondary | ICD-10-CM | POA: Diagnosis not present

## 2014-11-20 DIAGNOSIS — N259 Disorder resulting from impaired renal tubular function, unspecified: Secondary | ICD-10-CM | POA: Diagnosis not present

## 2014-11-22 DIAGNOSIS — N186 End stage renal disease: Secondary | ICD-10-CM | POA: Diagnosis not present

## 2014-11-22 DIAGNOSIS — D509 Iron deficiency anemia, unspecified: Secondary | ICD-10-CM | POA: Diagnosis not present

## 2014-11-22 DIAGNOSIS — N2589 Other disorders resulting from impaired renal tubular function: Secondary | ICD-10-CM | POA: Diagnosis not present

## 2014-11-22 DIAGNOSIS — N259 Disorder resulting from impaired renal tubular function, unspecified: Secondary | ICD-10-CM | POA: Diagnosis not present

## 2014-11-22 DIAGNOSIS — E875 Hyperkalemia: Secondary | ICD-10-CM | POA: Diagnosis not present

## 2014-11-24 DIAGNOSIS — Z7901 Long term (current) use of anticoagulants: Secondary | ICD-10-CM | POA: Diagnosis not present

## 2014-11-25 DIAGNOSIS — N259 Disorder resulting from impaired renal tubular function, unspecified: Secondary | ICD-10-CM | POA: Diagnosis not present

## 2014-11-25 DIAGNOSIS — H5713 Ocular pain, bilateral: Secondary | ICD-10-CM | POA: Diagnosis not present

## 2014-11-25 DIAGNOSIS — H2513 Age-related nuclear cataract, bilateral: Secondary | ICD-10-CM | POA: Diagnosis not present

## 2014-11-25 DIAGNOSIS — N186 End stage renal disease: Secondary | ICD-10-CM | POA: Diagnosis not present

## 2014-11-25 DIAGNOSIS — G4489 Other headache syndrome: Secondary | ICD-10-CM | POA: Diagnosis not present

## 2014-11-25 DIAGNOSIS — E875 Hyperkalemia: Secondary | ICD-10-CM | POA: Diagnosis not present

## 2014-11-25 DIAGNOSIS — N2589 Other disorders resulting from impaired renal tubular function: Secondary | ICD-10-CM | POA: Diagnosis not present

## 2014-11-25 DIAGNOSIS — D509 Iron deficiency anemia, unspecified: Secondary | ICD-10-CM | POA: Diagnosis not present

## 2014-11-27 DIAGNOSIS — D509 Iron deficiency anemia, unspecified: Secondary | ICD-10-CM | POA: Diagnosis not present

## 2014-11-27 DIAGNOSIS — N186 End stage renal disease: Secondary | ICD-10-CM | POA: Diagnosis not present

## 2014-11-27 DIAGNOSIS — N259 Disorder resulting from impaired renal tubular function, unspecified: Secondary | ICD-10-CM | POA: Diagnosis not present

## 2014-11-27 DIAGNOSIS — N2589 Other disorders resulting from impaired renal tubular function: Secondary | ICD-10-CM | POA: Diagnosis not present

## 2014-11-27 DIAGNOSIS — E875 Hyperkalemia: Secondary | ICD-10-CM | POA: Diagnosis not present

## 2014-11-28 DIAGNOSIS — G629 Polyneuropathy, unspecified: Secondary | ICD-10-CM | POA: Diagnosis not present

## 2014-11-28 DIAGNOSIS — H578 Other specified disorders of eye and adnexa: Secondary | ICD-10-CM | POA: Diagnosis not present

## 2014-11-28 DIAGNOSIS — M62838 Other muscle spasm: Secondary | ICD-10-CM | POA: Diagnosis not present

## 2014-11-28 DIAGNOSIS — H547 Unspecified visual loss: Secondary | ICD-10-CM | POA: Diagnosis not present

## 2014-11-29 DIAGNOSIS — N259 Disorder resulting from impaired renal tubular function, unspecified: Secondary | ICD-10-CM | POA: Diagnosis not present

## 2014-11-29 DIAGNOSIS — N2589 Other disorders resulting from impaired renal tubular function: Secondary | ICD-10-CM | POA: Diagnosis not present

## 2014-11-29 DIAGNOSIS — E875 Hyperkalemia: Secondary | ICD-10-CM | POA: Diagnosis not present

## 2014-11-29 DIAGNOSIS — D509 Iron deficiency anemia, unspecified: Secondary | ICD-10-CM | POA: Diagnosis not present

## 2014-11-29 DIAGNOSIS — N186 End stage renal disease: Secondary | ICD-10-CM | POA: Diagnosis not present

## 2014-12-01 DIAGNOSIS — Z7901 Long term (current) use of anticoagulants: Secondary | ICD-10-CM | POA: Diagnosis not present

## 2014-12-02 DIAGNOSIS — N2589 Other disorders resulting from impaired renal tubular function: Secondary | ICD-10-CM | POA: Diagnosis not present

## 2014-12-02 DIAGNOSIS — N259 Disorder resulting from impaired renal tubular function, unspecified: Secondary | ICD-10-CM | POA: Diagnosis not present

## 2014-12-02 DIAGNOSIS — E875 Hyperkalemia: Secondary | ICD-10-CM | POA: Diagnosis not present

## 2014-12-02 DIAGNOSIS — N186 End stage renal disease: Secondary | ICD-10-CM | POA: Diagnosis not present

## 2014-12-02 DIAGNOSIS — D509 Iron deficiency anemia, unspecified: Secondary | ICD-10-CM | POA: Diagnosis not present

## 2014-12-04 DIAGNOSIS — D509 Iron deficiency anemia, unspecified: Secondary | ICD-10-CM | POA: Diagnosis not present

## 2014-12-04 DIAGNOSIS — N259 Disorder resulting from impaired renal tubular function, unspecified: Secondary | ICD-10-CM | POA: Diagnosis not present

## 2014-12-04 DIAGNOSIS — E875 Hyperkalemia: Secondary | ICD-10-CM | POA: Diagnosis not present

## 2014-12-04 DIAGNOSIS — N2581 Secondary hyperparathyroidism of renal origin: Secondary | ICD-10-CM | POA: Diagnosis not present

## 2014-12-04 DIAGNOSIS — N186 End stage renal disease: Secondary | ICD-10-CM | POA: Diagnosis not present

## 2014-12-04 DIAGNOSIS — N2589 Other disorders resulting from impaired renal tubular function: Secondary | ICD-10-CM | POA: Diagnosis not present

## 2014-12-06 DIAGNOSIS — N259 Disorder resulting from impaired renal tubular function, unspecified: Secondary | ICD-10-CM | POA: Diagnosis not present

## 2014-12-06 DIAGNOSIS — D509 Iron deficiency anemia, unspecified: Secondary | ICD-10-CM | POA: Diagnosis not present

## 2014-12-06 DIAGNOSIS — N2581 Secondary hyperparathyroidism of renal origin: Secondary | ICD-10-CM | POA: Diagnosis not present

## 2014-12-06 DIAGNOSIS — N186 End stage renal disease: Secondary | ICD-10-CM | POA: Diagnosis not present

## 2014-12-06 DIAGNOSIS — E875 Hyperkalemia: Secondary | ICD-10-CM | POA: Diagnosis not present

## 2014-12-09 DIAGNOSIS — D509 Iron deficiency anemia, unspecified: Secondary | ICD-10-CM | POA: Diagnosis not present

## 2014-12-09 DIAGNOSIS — E875 Hyperkalemia: Secondary | ICD-10-CM | POA: Diagnosis not present

## 2014-12-09 DIAGNOSIS — N2581 Secondary hyperparathyroidism of renal origin: Secondary | ICD-10-CM | POA: Diagnosis not present

## 2014-12-09 DIAGNOSIS — N259 Disorder resulting from impaired renal tubular function, unspecified: Secondary | ICD-10-CM | POA: Diagnosis not present

## 2014-12-09 DIAGNOSIS — Z7901 Long term (current) use of anticoagulants: Secondary | ICD-10-CM | POA: Diagnosis not present

## 2014-12-09 DIAGNOSIS — N186 End stage renal disease: Secondary | ICD-10-CM | POA: Diagnosis not present

## 2014-12-11 DIAGNOSIS — N259 Disorder resulting from impaired renal tubular function, unspecified: Secondary | ICD-10-CM | POA: Diagnosis not present

## 2014-12-11 DIAGNOSIS — N186 End stage renal disease: Secondary | ICD-10-CM | POA: Diagnosis not present

## 2014-12-11 DIAGNOSIS — D509 Iron deficiency anemia, unspecified: Secondary | ICD-10-CM | POA: Diagnosis not present

## 2014-12-11 DIAGNOSIS — N2581 Secondary hyperparathyroidism of renal origin: Secondary | ICD-10-CM | POA: Diagnosis not present

## 2014-12-11 DIAGNOSIS — E875 Hyperkalemia: Secondary | ICD-10-CM | POA: Diagnosis not present

## 2014-12-13 DIAGNOSIS — E875 Hyperkalemia: Secondary | ICD-10-CM | POA: Diagnosis not present

## 2014-12-13 DIAGNOSIS — N2581 Secondary hyperparathyroidism of renal origin: Secondary | ICD-10-CM | POA: Diagnosis not present

## 2014-12-13 DIAGNOSIS — D509 Iron deficiency anemia, unspecified: Secondary | ICD-10-CM | POA: Diagnosis not present

## 2014-12-13 DIAGNOSIS — N186 End stage renal disease: Secondary | ICD-10-CM | POA: Diagnosis not present

## 2014-12-13 DIAGNOSIS — N259 Disorder resulting from impaired renal tubular function, unspecified: Secondary | ICD-10-CM | POA: Diagnosis not present

## 2014-12-15 DIAGNOSIS — I120 Hypertensive chronic kidney disease with stage 5 chronic kidney disease or end stage renal disease: Secondary | ICD-10-CM | POA: Diagnosis not present

## 2014-12-15 DIAGNOSIS — Z992 Dependence on renal dialysis: Secondary | ICD-10-CM | POA: Diagnosis not present

## 2014-12-15 DIAGNOSIS — Z7901 Long term (current) use of anticoagulants: Secondary | ICD-10-CM | POA: Diagnosis not present

## 2014-12-15 DIAGNOSIS — I4891 Unspecified atrial fibrillation: Secondary | ICD-10-CM | POA: Diagnosis not present

## 2014-12-15 DIAGNOSIS — N186 End stage renal disease: Secondary | ICD-10-CM | POA: Diagnosis not present

## 2014-12-16 DIAGNOSIS — H31003 Unspecified chorioretinal scars, bilateral: Secondary | ICD-10-CM | POA: Diagnosis not present

## 2014-12-16 DIAGNOSIS — N259 Disorder resulting from impaired renal tubular function, unspecified: Secondary | ICD-10-CM | POA: Diagnosis not present

## 2014-12-16 DIAGNOSIS — N186 End stage renal disease: Secondary | ICD-10-CM | POA: Diagnosis not present

## 2014-12-16 DIAGNOSIS — N2581 Secondary hyperparathyroidism of renal origin: Secondary | ICD-10-CM | POA: Diagnosis not present

## 2014-12-16 DIAGNOSIS — H31093 Other chorioretinal scars, bilateral: Secondary | ICD-10-CM | POA: Diagnosis not present

## 2014-12-16 DIAGNOSIS — D509 Iron deficiency anemia, unspecified: Secondary | ICD-10-CM | POA: Diagnosis not present

## 2014-12-16 DIAGNOSIS — E875 Hyperkalemia: Secondary | ICD-10-CM | POA: Diagnosis not present

## 2014-12-18 DIAGNOSIS — N2581 Secondary hyperparathyroidism of renal origin: Secondary | ICD-10-CM | POA: Diagnosis not present

## 2014-12-18 DIAGNOSIS — N186 End stage renal disease: Secondary | ICD-10-CM | POA: Diagnosis not present

## 2014-12-18 DIAGNOSIS — D509 Iron deficiency anemia, unspecified: Secondary | ICD-10-CM | POA: Diagnosis not present

## 2014-12-18 DIAGNOSIS — N259 Disorder resulting from impaired renal tubular function, unspecified: Secondary | ICD-10-CM | POA: Diagnosis not present

## 2014-12-18 DIAGNOSIS — E875 Hyperkalemia: Secondary | ICD-10-CM | POA: Diagnosis not present

## 2014-12-20 DIAGNOSIS — N2581 Secondary hyperparathyroidism of renal origin: Secondary | ICD-10-CM | POA: Diagnosis not present

## 2014-12-20 DIAGNOSIS — E875 Hyperkalemia: Secondary | ICD-10-CM | POA: Diagnosis not present

## 2014-12-20 DIAGNOSIS — N259 Disorder resulting from impaired renal tubular function, unspecified: Secondary | ICD-10-CM | POA: Diagnosis not present

## 2014-12-20 DIAGNOSIS — N186 End stage renal disease: Secondary | ICD-10-CM | POA: Diagnosis not present

## 2014-12-20 DIAGNOSIS — D509 Iron deficiency anemia, unspecified: Secondary | ICD-10-CM | POA: Diagnosis not present

## 2014-12-23 DIAGNOSIS — N259 Disorder resulting from impaired renal tubular function, unspecified: Secondary | ICD-10-CM | POA: Diagnosis not present

## 2014-12-23 DIAGNOSIS — D509 Iron deficiency anemia, unspecified: Secondary | ICD-10-CM | POA: Diagnosis not present

## 2014-12-23 DIAGNOSIS — E875 Hyperkalemia: Secondary | ICD-10-CM | POA: Diagnosis not present

## 2014-12-23 DIAGNOSIS — N2581 Secondary hyperparathyroidism of renal origin: Secondary | ICD-10-CM | POA: Diagnosis not present

## 2014-12-23 DIAGNOSIS — N186 End stage renal disease: Secondary | ICD-10-CM | POA: Diagnosis not present

## 2014-12-24 DIAGNOSIS — H31003 Unspecified chorioretinal scars, bilateral: Secondary | ICD-10-CM | POA: Diagnosis not present

## 2014-12-24 DIAGNOSIS — H31093 Other chorioretinal scars, bilateral: Secondary | ICD-10-CM | POA: Diagnosis not present

## 2014-12-25 DIAGNOSIS — N2581 Secondary hyperparathyroidism of renal origin: Secondary | ICD-10-CM | POA: Diagnosis not present

## 2014-12-25 DIAGNOSIS — D509 Iron deficiency anemia, unspecified: Secondary | ICD-10-CM | POA: Diagnosis not present

## 2014-12-25 DIAGNOSIS — N186 End stage renal disease: Secondary | ICD-10-CM | POA: Diagnosis not present

## 2014-12-25 DIAGNOSIS — N259 Disorder resulting from impaired renal tubular function, unspecified: Secondary | ICD-10-CM | POA: Diagnosis not present

## 2014-12-25 DIAGNOSIS — E875 Hyperkalemia: Secondary | ICD-10-CM | POA: Diagnosis not present

## 2014-12-27 DIAGNOSIS — N186 End stage renal disease: Secondary | ICD-10-CM | POA: Diagnosis not present

## 2014-12-27 DIAGNOSIS — N2581 Secondary hyperparathyroidism of renal origin: Secondary | ICD-10-CM | POA: Diagnosis not present

## 2014-12-27 DIAGNOSIS — D509 Iron deficiency anemia, unspecified: Secondary | ICD-10-CM | POA: Diagnosis not present

## 2014-12-27 DIAGNOSIS — E875 Hyperkalemia: Secondary | ICD-10-CM | POA: Diagnosis not present

## 2014-12-27 DIAGNOSIS — N259 Disorder resulting from impaired renal tubular function, unspecified: Secondary | ICD-10-CM | POA: Diagnosis not present

## 2014-12-29 DIAGNOSIS — Z7901 Long term (current) use of anticoagulants: Secondary | ICD-10-CM | POA: Diagnosis not present

## 2014-12-29 DIAGNOSIS — H3530 Unspecified macular degeneration: Secondary | ICD-10-CM | POA: Diagnosis not present

## 2014-12-29 DIAGNOSIS — H259 Unspecified age-related cataract: Secondary | ICD-10-CM | POA: Diagnosis not present

## 2014-12-30 DIAGNOSIS — D509 Iron deficiency anemia, unspecified: Secondary | ICD-10-CM | POA: Diagnosis not present

## 2014-12-30 DIAGNOSIS — E875 Hyperkalemia: Secondary | ICD-10-CM | POA: Diagnosis not present

## 2014-12-30 DIAGNOSIS — N186 End stage renal disease: Secondary | ICD-10-CM | POA: Diagnosis not present

## 2014-12-30 DIAGNOSIS — N259 Disorder resulting from impaired renal tubular function, unspecified: Secondary | ICD-10-CM | POA: Diagnosis not present

## 2014-12-30 DIAGNOSIS — N2581 Secondary hyperparathyroidism of renal origin: Secondary | ICD-10-CM | POA: Diagnosis not present

## 2014-12-31 DIAGNOSIS — K610 Anal abscess: Secondary | ICD-10-CM | POA: Diagnosis not present

## 2014-12-31 DIAGNOSIS — I4891 Unspecified atrial fibrillation: Secondary | ICD-10-CM | POA: Diagnosis not present

## 2014-12-31 DIAGNOSIS — N186 End stage renal disease: Secondary | ICD-10-CM | POA: Diagnosis not present

## 2014-12-31 DIAGNOSIS — L02215 Cutaneous abscess of perineum: Secondary | ICD-10-CM | POA: Diagnosis not present

## 2014-12-31 DIAGNOSIS — Z992 Dependence on renal dialysis: Secondary | ICD-10-CM | POA: Diagnosis not present

## 2015-01-01 DIAGNOSIS — N259 Disorder resulting from impaired renal tubular function, unspecified: Secondary | ICD-10-CM | POA: Diagnosis not present

## 2015-01-01 DIAGNOSIS — E875 Hyperkalemia: Secondary | ICD-10-CM | POA: Diagnosis not present

## 2015-01-01 DIAGNOSIS — N2581 Secondary hyperparathyroidism of renal origin: Secondary | ICD-10-CM | POA: Diagnosis not present

## 2015-01-01 DIAGNOSIS — N186 End stage renal disease: Secondary | ICD-10-CM | POA: Diagnosis not present

## 2015-01-01 DIAGNOSIS — D509 Iron deficiency anemia, unspecified: Secondary | ICD-10-CM | POA: Diagnosis not present

## 2015-01-03 DIAGNOSIS — D509 Iron deficiency anemia, unspecified: Secondary | ICD-10-CM | POA: Diagnosis not present

## 2015-01-03 DIAGNOSIS — N186 End stage renal disease: Secondary | ICD-10-CM | POA: Diagnosis not present

## 2015-01-03 DIAGNOSIS — N259 Disorder resulting from impaired renal tubular function, unspecified: Secondary | ICD-10-CM | POA: Diagnosis not present

## 2015-01-03 DIAGNOSIS — E875 Hyperkalemia: Secondary | ICD-10-CM | POA: Diagnosis not present

## 2015-01-05 DIAGNOSIS — Z7901 Long term (current) use of anticoagulants: Secondary | ICD-10-CM | POA: Diagnosis not present

## 2015-01-05 DIAGNOSIS — D699 Hemorrhagic condition, unspecified: Secondary | ICD-10-CM | POA: Diagnosis not present

## 2015-01-05 DIAGNOSIS — F4321 Adjustment disorder with depressed mood: Secondary | ICD-10-CM | POA: Diagnosis not present

## 2015-01-06 DIAGNOSIS — N186 End stage renal disease: Secondary | ICD-10-CM | POA: Diagnosis not present

## 2015-01-06 DIAGNOSIS — N259 Disorder resulting from impaired renal tubular function, unspecified: Secondary | ICD-10-CM | POA: Diagnosis not present

## 2015-01-06 DIAGNOSIS — D509 Iron deficiency anemia, unspecified: Secondary | ICD-10-CM | POA: Diagnosis not present

## 2015-01-06 DIAGNOSIS — E875 Hyperkalemia: Secondary | ICD-10-CM | POA: Diagnosis not present

## 2015-01-07 DIAGNOSIS — L02215 Cutaneous abscess of perineum: Secondary | ICD-10-CM | POA: Diagnosis not present

## 2015-01-07 DIAGNOSIS — N186 End stage renal disease: Secondary | ICD-10-CM | POA: Diagnosis not present

## 2015-01-07 DIAGNOSIS — I4891 Unspecified atrial fibrillation: Secondary | ICD-10-CM | POA: Diagnosis not present

## 2015-01-07 DIAGNOSIS — Z992 Dependence on renal dialysis: Secondary | ICD-10-CM | POA: Diagnosis not present

## 2015-01-08 DIAGNOSIS — D509 Iron deficiency anemia, unspecified: Secondary | ICD-10-CM | POA: Diagnosis not present

## 2015-01-08 DIAGNOSIS — N259 Disorder resulting from impaired renal tubular function, unspecified: Secondary | ICD-10-CM | POA: Diagnosis not present

## 2015-01-08 DIAGNOSIS — N186 End stage renal disease: Secondary | ICD-10-CM | POA: Diagnosis not present

## 2015-01-08 DIAGNOSIS — E875 Hyperkalemia: Secondary | ICD-10-CM | POA: Diagnosis not present

## 2015-01-10 DIAGNOSIS — D509 Iron deficiency anemia, unspecified: Secondary | ICD-10-CM | POA: Diagnosis not present

## 2015-01-10 DIAGNOSIS — E875 Hyperkalemia: Secondary | ICD-10-CM | POA: Diagnosis not present

## 2015-01-10 DIAGNOSIS — N259 Disorder resulting from impaired renal tubular function, unspecified: Secondary | ICD-10-CM | POA: Diagnosis not present

## 2015-01-10 DIAGNOSIS — N186 End stage renal disease: Secondary | ICD-10-CM | POA: Diagnosis not present

## 2015-01-13 DIAGNOSIS — Z992 Dependence on renal dialysis: Secondary | ICD-10-CM | POA: Diagnosis not present

## 2015-01-13 DIAGNOSIS — N186 End stage renal disease: Secondary | ICD-10-CM | POA: Diagnosis not present

## 2015-01-13 DIAGNOSIS — I120 Hypertensive chronic kidney disease with stage 5 chronic kidney disease or end stage renal disease: Secondary | ICD-10-CM | POA: Diagnosis not present

## 2015-01-13 DIAGNOSIS — E875 Hyperkalemia: Secondary | ICD-10-CM | POA: Diagnosis not present

## 2015-01-13 DIAGNOSIS — I4891 Unspecified atrial fibrillation: Secondary | ICD-10-CM | POA: Diagnosis not present

## 2015-01-13 DIAGNOSIS — N259 Disorder resulting from impaired renal tubular function, unspecified: Secondary | ICD-10-CM | POA: Diagnosis not present

## 2015-01-13 DIAGNOSIS — D509 Iron deficiency anemia, unspecified: Secondary | ICD-10-CM | POA: Diagnosis not present

## 2015-01-15 DIAGNOSIS — E875 Hyperkalemia: Secondary | ICD-10-CM | POA: Diagnosis not present

## 2015-01-15 DIAGNOSIS — D509 Iron deficiency anemia, unspecified: Secondary | ICD-10-CM | POA: Diagnosis not present

## 2015-01-15 DIAGNOSIS — Z7901 Long term (current) use of anticoagulants: Secondary | ICD-10-CM | POA: Diagnosis not present

## 2015-01-15 DIAGNOSIS — N259 Disorder resulting from impaired renal tubular function, unspecified: Secondary | ICD-10-CM | POA: Diagnosis not present

## 2015-01-15 DIAGNOSIS — N186 End stage renal disease: Secondary | ICD-10-CM | POA: Diagnosis not present

## 2015-01-17 DIAGNOSIS — N259 Disorder resulting from impaired renal tubular function, unspecified: Secondary | ICD-10-CM | POA: Diagnosis not present

## 2015-01-17 DIAGNOSIS — N186 End stage renal disease: Secondary | ICD-10-CM | POA: Diagnosis not present

## 2015-01-17 DIAGNOSIS — D509 Iron deficiency anemia, unspecified: Secondary | ICD-10-CM | POA: Diagnosis not present

## 2015-01-17 DIAGNOSIS — E875 Hyperkalemia: Secondary | ICD-10-CM | POA: Diagnosis not present

## 2015-01-19 DIAGNOSIS — F4321 Adjustment disorder with depressed mood: Secondary | ICD-10-CM | POA: Diagnosis not present

## 2015-01-20 DIAGNOSIS — D509 Iron deficiency anemia, unspecified: Secondary | ICD-10-CM | POA: Diagnosis not present

## 2015-01-20 DIAGNOSIS — N186 End stage renal disease: Secondary | ICD-10-CM | POA: Diagnosis not present

## 2015-01-20 DIAGNOSIS — N259 Disorder resulting from impaired renal tubular function, unspecified: Secondary | ICD-10-CM | POA: Diagnosis not present

## 2015-01-20 DIAGNOSIS — E875 Hyperkalemia: Secondary | ICD-10-CM | POA: Diagnosis not present

## 2015-01-22 DIAGNOSIS — M62838 Other muscle spasm: Secondary | ICD-10-CM | POA: Diagnosis not present

## 2015-01-22 DIAGNOSIS — N259 Disorder resulting from impaired renal tubular function, unspecified: Secondary | ICD-10-CM | POA: Diagnosis not present

## 2015-01-22 DIAGNOSIS — E875 Hyperkalemia: Secondary | ICD-10-CM | POA: Diagnosis not present

## 2015-01-22 DIAGNOSIS — N186 End stage renal disease: Secondary | ICD-10-CM | POA: Diagnosis not present

## 2015-01-22 DIAGNOSIS — D509 Iron deficiency anemia, unspecified: Secondary | ICD-10-CM | POA: Diagnosis not present

## 2015-01-22 DIAGNOSIS — M549 Dorsalgia, unspecified: Secondary | ICD-10-CM | POA: Diagnosis not present

## 2015-01-22 DIAGNOSIS — G629 Polyneuropathy, unspecified: Secondary | ICD-10-CM | POA: Diagnosis not present

## 2015-01-24 DIAGNOSIS — D509 Iron deficiency anemia, unspecified: Secondary | ICD-10-CM | POA: Diagnosis not present

## 2015-01-24 DIAGNOSIS — N186 End stage renal disease: Secondary | ICD-10-CM | POA: Diagnosis not present

## 2015-01-24 DIAGNOSIS — N259 Disorder resulting from impaired renal tubular function, unspecified: Secondary | ICD-10-CM | POA: Diagnosis not present

## 2015-01-24 DIAGNOSIS — E875 Hyperkalemia: Secondary | ICD-10-CM | POA: Diagnosis not present

## 2015-01-27 DIAGNOSIS — D509 Iron deficiency anemia, unspecified: Secondary | ICD-10-CM | POA: Diagnosis not present

## 2015-01-27 DIAGNOSIS — N259 Disorder resulting from impaired renal tubular function, unspecified: Secondary | ICD-10-CM | POA: Diagnosis not present

## 2015-01-27 DIAGNOSIS — E875 Hyperkalemia: Secondary | ICD-10-CM | POA: Diagnosis not present

## 2015-01-27 DIAGNOSIS — N186 End stage renal disease: Secondary | ICD-10-CM | POA: Diagnosis not present

## 2015-01-29 DIAGNOSIS — E875 Hyperkalemia: Secondary | ICD-10-CM | POA: Diagnosis not present

## 2015-01-29 DIAGNOSIS — N186 End stage renal disease: Secondary | ICD-10-CM | POA: Diagnosis not present

## 2015-01-29 DIAGNOSIS — D509 Iron deficiency anemia, unspecified: Secondary | ICD-10-CM | POA: Diagnosis not present

## 2015-01-29 DIAGNOSIS — N259 Disorder resulting from impaired renal tubular function, unspecified: Secondary | ICD-10-CM | POA: Diagnosis not present

## 2015-01-29 DIAGNOSIS — D699 Hemorrhagic condition, unspecified: Secondary | ICD-10-CM | POA: Diagnosis not present

## 2015-01-29 DIAGNOSIS — Z7901 Long term (current) use of anticoagulants: Secondary | ICD-10-CM | POA: Diagnosis not present

## 2015-01-31 DIAGNOSIS — E875 Hyperkalemia: Secondary | ICD-10-CM | POA: Diagnosis not present

## 2015-01-31 DIAGNOSIS — N186 End stage renal disease: Secondary | ICD-10-CM | POA: Diagnosis not present

## 2015-01-31 DIAGNOSIS — D509 Iron deficiency anemia, unspecified: Secondary | ICD-10-CM | POA: Diagnosis not present

## 2015-01-31 DIAGNOSIS — N259 Disorder resulting from impaired renal tubular function, unspecified: Secondary | ICD-10-CM | POA: Diagnosis not present

## 2015-01-31 DIAGNOSIS — Z992 Dependence on renal dialysis: Secondary | ICD-10-CM | POA: Diagnosis not present

## 2015-01-31 DIAGNOSIS — S025XXD Fracture of tooth (traumatic), subsequent encounter for fracture with routine healing: Secondary | ICD-10-CM | POA: Diagnosis not present

## 2015-01-31 DIAGNOSIS — K0889 Other specified disorders of teeth and supporting structures: Secondary | ICD-10-CM | POA: Diagnosis not present

## 2015-02-02 DIAGNOSIS — F4321 Adjustment disorder with depressed mood: Secondary | ICD-10-CM | POA: Diagnosis not present

## 2015-02-03 DIAGNOSIS — E875 Hyperkalemia: Secondary | ICD-10-CM | POA: Diagnosis not present

## 2015-02-03 DIAGNOSIS — D509 Iron deficiency anemia, unspecified: Secondary | ICD-10-CM | POA: Diagnosis not present

## 2015-02-03 DIAGNOSIS — R7989 Other specified abnormal findings of blood chemistry: Secondary | ICD-10-CM | POA: Diagnosis not present

## 2015-02-03 DIAGNOSIS — N186 End stage renal disease: Secondary | ICD-10-CM | POA: Diagnosis not present

## 2015-02-03 DIAGNOSIS — N259 Disorder resulting from impaired renal tubular function, unspecified: Secondary | ICD-10-CM | POA: Diagnosis not present

## 2015-02-05 DIAGNOSIS — I4891 Unspecified atrial fibrillation: Secondary | ICD-10-CM | POA: Diagnosis not present

## 2015-02-05 DIAGNOSIS — D509 Iron deficiency anemia, unspecified: Secondary | ICD-10-CM | POA: Diagnosis not present

## 2015-02-05 DIAGNOSIS — N259 Disorder resulting from impaired renal tubular function, unspecified: Secondary | ICD-10-CM | POA: Diagnosis not present

## 2015-02-05 DIAGNOSIS — D649 Anemia, unspecified: Secondary | ICD-10-CM | POA: Diagnosis not present

## 2015-02-05 DIAGNOSIS — E875 Hyperkalemia: Secondary | ICD-10-CM | POA: Diagnosis not present

## 2015-02-05 DIAGNOSIS — R7989 Other specified abnormal findings of blood chemistry: Secondary | ICD-10-CM | POA: Diagnosis not present

## 2015-02-05 DIAGNOSIS — N186 End stage renal disease: Secondary | ICD-10-CM | POA: Diagnosis not present

## 2015-02-07 DIAGNOSIS — E875 Hyperkalemia: Secondary | ICD-10-CM | POA: Diagnosis not present

## 2015-02-07 DIAGNOSIS — D509 Iron deficiency anemia, unspecified: Secondary | ICD-10-CM | POA: Diagnosis not present

## 2015-02-07 DIAGNOSIS — N186 End stage renal disease: Secondary | ICD-10-CM | POA: Diagnosis not present

## 2015-02-07 DIAGNOSIS — N259 Disorder resulting from impaired renal tubular function, unspecified: Secondary | ICD-10-CM | POA: Diagnosis not present

## 2015-02-07 DIAGNOSIS — R7989 Other specified abnormal findings of blood chemistry: Secondary | ICD-10-CM | POA: Diagnosis not present

## 2015-02-10 DIAGNOSIS — E875 Hyperkalemia: Secondary | ICD-10-CM | POA: Diagnosis not present

## 2015-02-10 DIAGNOSIS — N186 End stage renal disease: Secondary | ICD-10-CM | POA: Diagnosis not present

## 2015-02-10 DIAGNOSIS — R7989 Other specified abnormal findings of blood chemistry: Secondary | ICD-10-CM | POA: Diagnosis not present

## 2015-02-10 DIAGNOSIS — D509 Iron deficiency anemia, unspecified: Secondary | ICD-10-CM | POA: Diagnosis not present

## 2015-02-10 DIAGNOSIS — N259 Disorder resulting from impaired renal tubular function, unspecified: Secondary | ICD-10-CM | POA: Diagnosis not present

## 2015-02-12 DIAGNOSIS — E875 Hyperkalemia: Secondary | ICD-10-CM | POA: Diagnosis not present

## 2015-02-12 DIAGNOSIS — N186 End stage renal disease: Secondary | ICD-10-CM | POA: Diagnosis not present

## 2015-02-12 DIAGNOSIS — N259 Disorder resulting from impaired renal tubular function, unspecified: Secondary | ICD-10-CM | POA: Diagnosis not present

## 2015-02-12 DIAGNOSIS — D509 Iron deficiency anemia, unspecified: Secondary | ICD-10-CM | POA: Diagnosis not present

## 2015-02-12 DIAGNOSIS — Z7901 Long term (current) use of anticoagulants: Secondary | ICD-10-CM | POA: Diagnosis not present

## 2015-02-12 DIAGNOSIS — R7989 Other specified abnormal findings of blood chemistry: Secondary | ICD-10-CM | POA: Diagnosis not present

## 2015-02-13 DIAGNOSIS — G629 Polyneuropathy, unspecified: Secondary | ICD-10-CM | POA: Diagnosis not present

## 2015-02-13 DIAGNOSIS — Z992 Dependence on renal dialysis: Secondary | ICD-10-CM | POA: Diagnosis not present

## 2015-02-13 DIAGNOSIS — I120 Hypertensive chronic kidney disease with stage 5 chronic kidney disease or end stage renal disease: Secondary | ICD-10-CM | POA: Diagnosis not present

## 2015-02-13 DIAGNOSIS — N186 End stage renal disease: Secondary | ICD-10-CM | POA: Diagnosis not present

## 2015-02-14 DIAGNOSIS — E875 Hyperkalemia: Secondary | ICD-10-CM | POA: Diagnosis not present

## 2015-02-14 DIAGNOSIS — D509 Iron deficiency anemia, unspecified: Secondary | ICD-10-CM | POA: Diagnosis not present

## 2015-02-14 DIAGNOSIS — N186 End stage renal disease: Secondary | ICD-10-CM | POA: Diagnosis not present

## 2015-02-14 DIAGNOSIS — R7989 Other specified abnormal findings of blood chemistry: Secondary | ICD-10-CM | POA: Diagnosis not present

## 2015-02-14 DIAGNOSIS — N259 Disorder resulting from impaired renal tubular function, unspecified: Secondary | ICD-10-CM | POA: Diagnosis not present

## 2015-02-17 DIAGNOSIS — D509 Iron deficiency anemia, unspecified: Secondary | ICD-10-CM | POA: Diagnosis not present

## 2015-02-17 DIAGNOSIS — R7989 Other specified abnormal findings of blood chemistry: Secondary | ICD-10-CM | POA: Diagnosis not present

## 2015-02-17 DIAGNOSIS — E875 Hyperkalemia: Secondary | ICD-10-CM | POA: Diagnosis not present

## 2015-02-17 DIAGNOSIS — N259 Disorder resulting from impaired renal tubular function, unspecified: Secondary | ICD-10-CM | POA: Diagnosis not present

## 2015-02-17 DIAGNOSIS — N186 End stage renal disease: Secondary | ICD-10-CM | POA: Diagnosis not present

## 2015-02-18 DIAGNOSIS — D699 Hemorrhagic condition, unspecified: Secondary | ICD-10-CM | POA: Diagnosis not present

## 2015-02-19 DIAGNOSIS — D509 Iron deficiency anemia, unspecified: Secondary | ICD-10-CM | POA: Diagnosis not present

## 2015-02-19 DIAGNOSIS — N186 End stage renal disease: Secondary | ICD-10-CM | POA: Diagnosis not present

## 2015-02-19 DIAGNOSIS — N259 Disorder resulting from impaired renal tubular function, unspecified: Secondary | ICD-10-CM | POA: Diagnosis not present

## 2015-02-19 DIAGNOSIS — R7989 Other specified abnormal findings of blood chemistry: Secondary | ICD-10-CM | POA: Diagnosis not present

## 2015-02-19 DIAGNOSIS — Z7901 Long term (current) use of anticoagulants: Secondary | ICD-10-CM | POA: Diagnosis not present

## 2015-02-19 DIAGNOSIS — E875 Hyperkalemia: Secondary | ICD-10-CM | POA: Diagnosis not present

## 2015-02-21 DIAGNOSIS — E875 Hyperkalemia: Secondary | ICD-10-CM | POA: Diagnosis not present

## 2015-02-21 DIAGNOSIS — N186 End stage renal disease: Secondary | ICD-10-CM | POA: Diagnosis not present

## 2015-02-21 DIAGNOSIS — R7989 Other specified abnormal findings of blood chemistry: Secondary | ICD-10-CM | POA: Diagnosis not present

## 2015-02-21 DIAGNOSIS — N259 Disorder resulting from impaired renal tubular function, unspecified: Secondary | ICD-10-CM | POA: Diagnosis not present

## 2015-02-21 DIAGNOSIS — D509 Iron deficiency anemia, unspecified: Secondary | ICD-10-CM | POA: Diagnosis not present

## 2015-02-23 DIAGNOSIS — F4321 Adjustment disorder with depressed mood: Secondary | ICD-10-CM | POA: Diagnosis not present

## 2015-02-23 DIAGNOSIS — M71572 Other bursitis, not elsewhere classified, left ankle and foot: Secondary | ICD-10-CM | POA: Diagnosis not present

## 2015-02-24 DIAGNOSIS — R269 Unspecified abnormalities of gait and mobility: Secondary | ICD-10-CM | POA: Diagnosis not present

## 2015-02-24 DIAGNOSIS — D509 Iron deficiency anemia, unspecified: Secondary | ICD-10-CM | POA: Diagnosis not present

## 2015-02-24 DIAGNOSIS — R7989 Other specified abnormal findings of blood chemistry: Secondary | ICD-10-CM | POA: Diagnosis not present

## 2015-02-24 DIAGNOSIS — N186 End stage renal disease: Secondary | ICD-10-CM | POA: Diagnosis not present

## 2015-02-24 DIAGNOSIS — N259 Disorder resulting from impaired renal tubular function, unspecified: Secondary | ICD-10-CM | POA: Diagnosis not present

## 2015-02-24 DIAGNOSIS — E875 Hyperkalemia: Secondary | ICD-10-CM | POA: Diagnosis not present

## 2015-02-24 DIAGNOSIS — S025XXD Fracture of tooth (traumatic), subsequent encounter for fracture with routine healing: Secondary | ICD-10-CM | POA: Diagnosis not present

## 2015-02-24 DIAGNOSIS — G629 Polyneuropathy, unspecified: Secondary | ICD-10-CM | POA: Diagnosis not present

## 2015-02-26 DIAGNOSIS — R7989 Other specified abnormal findings of blood chemistry: Secondary | ICD-10-CM | POA: Diagnosis not present

## 2015-02-26 DIAGNOSIS — D509 Iron deficiency anemia, unspecified: Secondary | ICD-10-CM | POA: Diagnosis not present

## 2015-02-26 DIAGNOSIS — E875 Hyperkalemia: Secondary | ICD-10-CM | POA: Diagnosis not present

## 2015-02-26 DIAGNOSIS — N259 Disorder resulting from impaired renal tubular function, unspecified: Secondary | ICD-10-CM | POA: Diagnosis not present

## 2015-02-26 DIAGNOSIS — N186 End stage renal disease: Secondary | ICD-10-CM | POA: Diagnosis not present

## 2015-02-28 DIAGNOSIS — N186 End stage renal disease: Secondary | ICD-10-CM | POA: Diagnosis not present

## 2015-02-28 DIAGNOSIS — D509 Iron deficiency anemia, unspecified: Secondary | ICD-10-CM | POA: Diagnosis not present

## 2015-02-28 DIAGNOSIS — N259 Disorder resulting from impaired renal tubular function, unspecified: Secondary | ICD-10-CM | POA: Diagnosis not present

## 2015-02-28 DIAGNOSIS — E875 Hyperkalemia: Secondary | ICD-10-CM | POA: Diagnosis not present

## 2015-02-28 DIAGNOSIS — R7989 Other specified abnormal findings of blood chemistry: Secondary | ICD-10-CM | POA: Diagnosis not present

## 2015-03-03 DIAGNOSIS — D509 Iron deficiency anemia, unspecified: Secondary | ICD-10-CM | POA: Diagnosis not present

## 2015-03-03 DIAGNOSIS — N186 End stage renal disease: Secondary | ICD-10-CM | POA: Diagnosis not present

## 2015-03-03 DIAGNOSIS — N259 Disorder resulting from impaired renal tubular function, unspecified: Secondary | ICD-10-CM | POA: Diagnosis not present

## 2015-03-03 DIAGNOSIS — R7989 Other specified abnormal findings of blood chemistry: Secondary | ICD-10-CM | POA: Diagnosis not present

## 2015-03-03 DIAGNOSIS — E875 Hyperkalemia: Secondary | ICD-10-CM | POA: Diagnosis not present

## 2015-03-05 DIAGNOSIS — N259 Disorder resulting from impaired renal tubular function, unspecified: Secondary | ICD-10-CM | POA: Diagnosis not present

## 2015-03-05 DIAGNOSIS — N186 End stage renal disease: Secondary | ICD-10-CM | POA: Diagnosis not present

## 2015-03-05 DIAGNOSIS — N2581 Secondary hyperparathyroidism of renal origin: Secondary | ICD-10-CM | POA: Diagnosis not present

## 2015-03-05 DIAGNOSIS — Z7901 Long term (current) use of anticoagulants: Secondary | ICD-10-CM | POA: Diagnosis not present

## 2015-03-05 DIAGNOSIS — J159 Unspecified bacterial pneumonia: Secondary | ICD-10-CM | POA: Diagnosis not present

## 2015-03-05 DIAGNOSIS — R55 Syncope and collapse: Secondary | ICD-10-CM | POA: Diagnosis not present

## 2015-03-05 DIAGNOSIS — L03115 Cellulitis of right lower limb: Secondary | ICD-10-CM | POA: Diagnosis not present

## 2015-03-05 DIAGNOSIS — I480 Paroxysmal atrial fibrillation: Secondary | ICD-10-CM | POA: Diagnosis not present

## 2015-03-05 DIAGNOSIS — E875 Hyperkalemia: Secondary | ICD-10-CM | POA: Diagnosis not present

## 2015-03-05 DIAGNOSIS — D509 Iron deficiency anemia, unspecified: Secondary | ICD-10-CM | POA: Diagnosis not present

## 2015-03-05 DIAGNOSIS — N2589 Other disorders resulting from impaired renal tubular function: Secondary | ICD-10-CM | POA: Diagnosis not present

## 2015-03-07 DIAGNOSIS — N259 Disorder resulting from impaired renal tubular function, unspecified: Secondary | ICD-10-CM | POA: Diagnosis not present

## 2015-03-07 DIAGNOSIS — N186 End stage renal disease: Secondary | ICD-10-CM | POA: Diagnosis not present

## 2015-03-07 DIAGNOSIS — D509 Iron deficiency anemia, unspecified: Secondary | ICD-10-CM | POA: Diagnosis not present

## 2015-03-07 DIAGNOSIS — N2589 Other disorders resulting from impaired renal tubular function: Secondary | ICD-10-CM | POA: Diagnosis not present

## 2015-03-07 DIAGNOSIS — N2581 Secondary hyperparathyroidism of renal origin: Secondary | ICD-10-CM | POA: Diagnosis not present

## 2015-03-09 DIAGNOSIS — N186 End stage renal disease: Secondary | ICD-10-CM | POA: Diagnosis not present

## 2015-03-09 DIAGNOSIS — Z992 Dependence on renal dialysis: Secondary | ICD-10-CM | POA: Diagnosis not present

## 2015-03-09 DIAGNOSIS — I120 Hypertensive chronic kidney disease with stage 5 chronic kidney disease or end stage renal disease: Secondary | ICD-10-CM | POA: Diagnosis not present

## 2015-03-09 DIAGNOSIS — I4891 Unspecified atrial fibrillation: Secondary | ICD-10-CM | POA: Diagnosis not present

## 2015-03-10 DIAGNOSIS — D509 Iron deficiency anemia, unspecified: Secondary | ICD-10-CM | POA: Diagnosis not present

## 2015-03-10 DIAGNOSIS — N186 End stage renal disease: Secondary | ICD-10-CM | POA: Diagnosis not present

## 2015-03-10 DIAGNOSIS — N2581 Secondary hyperparathyroidism of renal origin: Secondary | ICD-10-CM | POA: Diagnosis not present

## 2015-03-10 DIAGNOSIS — N259 Disorder resulting from impaired renal tubular function, unspecified: Secondary | ICD-10-CM | POA: Diagnosis not present

## 2015-03-10 DIAGNOSIS — N2589 Other disorders resulting from impaired renal tubular function: Secondary | ICD-10-CM | POA: Diagnosis not present

## 2015-03-12 DIAGNOSIS — Z7901 Long term (current) use of anticoagulants: Secondary | ICD-10-CM | POA: Diagnosis not present

## 2015-03-12 DIAGNOSIS — N2581 Secondary hyperparathyroidism of renal origin: Secondary | ICD-10-CM | POA: Diagnosis not present

## 2015-03-12 DIAGNOSIS — N186 End stage renal disease: Secondary | ICD-10-CM | POA: Diagnosis not present

## 2015-03-12 DIAGNOSIS — N2589 Other disorders resulting from impaired renal tubular function: Secondary | ICD-10-CM | POA: Diagnosis not present

## 2015-03-12 DIAGNOSIS — D509 Iron deficiency anemia, unspecified: Secondary | ICD-10-CM | POA: Diagnosis not present

## 2015-03-12 DIAGNOSIS — N259 Disorder resulting from impaired renal tubular function, unspecified: Secondary | ICD-10-CM | POA: Diagnosis not present

## 2015-03-14 DIAGNOSIS — N186 End stage renal disease: Secondary | ICD-10-CM | POA: Diagnosis not present

## 2015-03-14 DIAGNOSIS — N2589 Other disorders resulting from impaired renal tubular function: Secondary | ICD-10-CM | POA: Diagnosis not present

## 2015-03-14 DIAGNOSIS — D509 Iron deficiency anemia, unspecified: Secondary | ICD-10-CM | POA: Diagnosis not present

## 2015-03-14 DIAGNOSIS — N259 Disorder resulting from impaired renal tubular function, unspecified: Secondary | ICD-10-CM | POA: Diagnosis not present

## 2015-03-14 DIAGNOSIS — N2581 Secondary hyperparathyroidism of renal origin: Secondary | ICD-10-CM | POA: Diagnosis not present

## 2015-03-17 DIAGNOSIS — D509 Iron deficiency anemia, unspecified: Secondary | ICD-10-CM | POA: Diagnosis not present

## 2015-03-17 DIAGNOSIS — N2589 Other disorders resulting from impaired renal tubular function: Secondary | ICD-10-CM | POA: Diagnosis not present

## 2015-03-17 DIAGNOSIS — N186 End stage renal disease: Secondary | ICD-10-CM | POA: Diagnosis not present

## 2015-03-17 DIAGNOSIS — N259 Disorder resulting from impaired renal tubular function, unspecified: Secondary | ICD-10-CM | POA: Diagnosis not present

## 2015-03-17 DIAGNOSIS — N2581 Secondary hyperparathyroidism of renal origin: Secondary | ICD-10-CM | POA: Diagnosis not present

## 2015-03-19 DIAGNOSIS — D509 Iron deficiency anemia, unspecified: Secondary | ICD-10-CM | POA: Diagnosis not present

## 2015-03-19 DIAGNOSIS — N259 Disorder resulting from impaired renal tubular function, unspecified: Secondary | ICD-10-CM | POA: Diagnosis not present

## 2015-03-19 DIAGNOSIS — N2589 Other disorders resulting from impaired renal tubular function: Secondary | ICD-10-CM | POA: Diagnosis not present

## 2015-03-19 DIAGNOSIS — N186 End stage renal disease: Secondary | ICD-10-CM | POA: Diagnosis not present

## 2015-03-19 DIAGNOSIS — N2581 Secondary hyperparathyroidism of renal origin: Secondary | ICD-10-CM | POA: Diagnosis not present

## 2015-03-20 DIAGNOSIS — M549 Dorsalgia, unspecified: Secondary | ICD-10-CM | POA: Diagnosis not present

## 2015-03-20 DIAGNOSIS — M62838 Other muscle spasm: Secondary | ICD-10-CM | POA: Diagnosis not present

## 2015-03-21 DIAGNOSIS — N186 End stage renal disease: Secondary | ICD-10-CM | POA: Diagnosis not present

## 2015-03-21 DIAGNOSIS — N2589 Other disorders resulting from impaired renal tubular function: Secondary | ICD-10-CM | POA: Diagnosis not present

## 2015-03-21 DIAGNOSIS — N2581 Secondary hyperparathyroidism of renal origin: Secondary | ICD-10-CM | POA: Diagnosis not present

## 2015-03-21 DIAGNOSIS — N259 Disorder resulting from impaired renal tubular function, unspecified: Secondary | ICD-10-CM | POA: Diagnosis not present

## 2015-03-21 DIAGNOSIS — D509 Iron deficiency anemia, unspecified: Secondary | ICD-10-CM | POA: Diagnosis not present

## 2015-03-24 DIAGNOSIS — D509 Iron deficiency anemia, unspecified: Secondary | ICD-10-CM | POA: Diagnosis not present

## 2015-03-24 DIAGNOSIS — N2581 Secondary hyperparathyroidism of renal origin: Secondary | ICD-10-CM | POA: Diagnosis not present

## 2015-03-24 DIAGNOSIS — N259 Disorder resulting from impaired renal tubular function, unspecified: Secondary | ICD-10-CM | POA: Diagnosis not present

## 2015-03-24 DIAGNOSIS — N186 End stage renal disease: Secondary | ICD-10-CM | POA: Diagnosis not present

## 2015-03-24 DIAGNOSIS — N2589 Other disorders resulting from impaired renal tubular function: Secondary | ICD-10-CM | POA: Diagnosis not present

## 2015-03-26 DIAGNOSIS — D509 Iron deficiency anemia, unspecified: Secondary | ICD-10-CM | POA: Diagnosis not present

## 2015-03-26 DIAGNOSIS — N186 End stage renal disease: Secondary | ICD-10-CM | POA: Diagnosis not present

## 2015-03-26 DIAGNOSIS — N2581 Secondary hyperparathyroidism of renal origin: Secondary | ICD-10-CM | POA: Diagnosis not present

## 2015-03-26 DIAGNOSIS — N259 Disorder resulting from impaired renal tubular function, unspecified: Secondary | ICD-10-CM | POA: Diagnosis not present

## 2015-03-26 DIAGNOSIS — N2589 Other disorders resulting from impaired renal tubular function: Secondary | ICD-10-CM | POA: Diagnosis not present

## 2015-03-26 DIAGNOSIS — K5900 Constipation, unspecified: Secondary | ICD-10-CM | POA: Diagnosis not present

## 2015-03-28 DIAGNOSIS — D509 Iron deficiency anemia, unspecified: Secondary | ICD-10-CM | POA: Diagnosis not present

## 2015-03-28 DIAGNOSIS — N2589 Other disorders resulting from impaired renal tubular function: Secondary | ICD-10-CM | POA: Diagnosis not present

## 2015-03-28 DIAGNOSIS — N259 Disorder resulting from impaired renal tubular function, unspecified: Secondary | ICD-10-CM | POA: Diagnosis not present

## 2015-03-28 DIAGNOSIS — N2581 Secondary hyperparathyroidism of renal origin: Secondary | ICD-10-CM | POA: Diagnosis not present

## 2015-03-28 DIAGNOSIS — N186 End stage renal disease: Secondary | ICD-10-CM | POA: Diagnosis not present

## 2015-03-31 DIAGNOSIS — N2581 Secondary hyperparathyroidism of renal origin: Secondary | ICD-10-CM | POA: Diagnosis not present

## 2015-03-31 DIAGNOSIS — N2589 Other disorders resulting from impaired renal tubular function: Secondary | ICD-10-CM | POA: Diagnosis not present

## 2015-03-31 DIAGNOSIS — N186 End stage renal disease: Secondary | ICD-10-CM | POA: Diagnosis not present

## 2015-03-31 DIAGNOSIS — D509 Iron deficiency anemia, unspecified: Secondary | ICD-10-CM | POA: Diagnosis not present

## 2015-03-31 DIAGNOSIS — Z7901 Long term (current) use of anticoagulants: Secondary | ICD-10-CM | POA: Diagnosis not present

## 2015-03-31 DIAGNOSIS — N259 Disorder resulting from impaired renal tubular function, unspecified: Secondary | ICD-10-CM | POA: Diagnosis not present

## 2015-04-02 DIAGNOSIS — N186 End stage renal disease: Secondary | ICD-10-CM | POA: Diagnosis not present

## 2015-04-02 DIAGNOSIS — N259 Disorder resulting from impaired renal tubular function, unspecified: Secondary | ICD-10-CM | POA: Diagnosis not present

## 2015-04-02 DIAGNOSIS — N2589 Other disorders resulting from impaired renal tubular function: Secondary | ICD-10-CM | POA: Diagnosis not present

## 2015-04-02 DIAGNOSIS — N2581 Secondary hyperparathyroidism of renal origin: Secondary | ICD-10-CM | POA: Diagnosis not present

## 2015-04-02 DIAGNOSIS — D509 Iron deficiency anemia, unspecified: Secondary | ICD-10-CM | POA: Diagnosis not present

## 2015-04-03 DIAGNOSIS — J029 Acute pharyngitis, unspecified: Secondary | ICD-10-CM | POA: Diagnosis not present

## 2015-04-03 DIAGNOSIS — R05 Cough: Secondary | ICD-10-CM | POA: Diagnosis not present

## 2015-04-04 DIAGNOSIS — N259 Disorder resulting from impaired renal tubular function, unspecified: Secondary | ICD-10-CM | POA: Diagnosis not present

## 2015-04-04 DIAGNOSIS — N186 End stage renal disease: Secondary | ICD-10-CM | POA: Diagnosis not present

## 2015-04-04 DIAGNOSIS — D509 Iron deficiency anemia, unspecified: Secondary | ICD-10-CM | POA: Diagnosis not present

## 2015-04-04 DIAGNOSIS — N2581 Secondary hyperparathyroidism of renal origin: Secondary | ICD-10-CM | POA: Diagnosis not present

## 2015-04-04 DIAGNOSIS — N2589 Other disorders resulting from impaired renal tubular function: Secondary | ICD-10-CM | POA: Diagnosis not present

## 2015-04-05 DIAGNOSIS — N186 End stage renal disease: Secondary | ICD-10-CM | POA: Diagnosis not present

## 2015-04-07 DIAGNOSIS — N2589 Other disorders resulting from impaired renal tubular function: Secondary | ICD-10-CM | POA: Diagnosis not present

## 2015-04-07 DIAGNOSIS — N259 Disorder resulting from impaired renal tubular function, unspecified: Secondary | ICD-10-CM | POA: Diagnosis not present

## 2015-04-07 DIAGNOSIS — N186 End stage renal disease: Secondary | ICD-10-CM | POA: Diagnosis not present

## 2015-04-07 DIAGNOSIS — N2581 Secondary hyperparathyroidism of renal origin: Secondary | ICD-10-CM | POA: Diagnosis not present

## 2015-04-07 DIAGNOSIS — E875 Hyperkalemia: Secondary | ICD-10-CM | POA: Diagnosis not present

## 2015-04-07 DIAGNOSIS — D509 Iron deficiency anemia, unspecified: Secondary | ICD-10-CM | POA: Diagnosis not present

## 2015-04-07 DIAGNOSIS — Z7901 Long term (current) use of anticoagulants: Secondary | ICD-10-CM | POA: Diagnosis not present

## 2015-04-09 DIAGNOSIS — D509 Iron deficiency anemia, unspecified: Secondary | ICD-10-CM | POA: Diagnosis not present

## 2015-04-09 DIAGNOSIS — N259 Disorder resulting from impaired renal tubular function, unspecified: Secondary | ICD-10-CM | POA: Diagnosis not present

## 2015-04-09 DIAGNOSIS — N2581 Secondary hyperparathyroidism of renal origin: Secondary | ICD-10-CM | POA: Diagnosis not present

## 2015-04-09 DIAGNOSIS — N186 End stage renal disease: Secondary | ICD-10-CM | POA: Diagnosis not present

## 2015-04-09 DIAGNOSIS — E875 Hyperkalemia: Secondary | ICD-10-CM | POA: Diagnosis not present

## 2015-04-10 DIAGNOSIS — T82838A Hemorrhage of vascular prosthetic devices, implants and grafts, initial encounter: Secondary | ICD-10-CM | POA: Diagnosis not present

## 2015-04-10 DIAGNOSIS — I4891 Unspecified atrial fibrillation: Secondary | ICD-10-CM | POA: Diagnosis not present

## 2015-04-10 DIAGNOSIS — N186 End stage renal disease: Secondary | ICD-10-CM | POA: Diagnosis not present

## 2015-04-10 DIAGNOSIS — Z992 Dependence on renal dialysis: Secondary | ICD-10-CM | POA: Diagnosis not present

## 2015-04-10 DIAGNOSIS — I120 Hypertensive chronic kidney disease with stage 5 chronic kidney disease or end stage renal disease: Secondary | ICD-10-CM | POA: Diagnosis not present

## 2015-04-11 DIAGNOSIS — N2581 Secondary hyperparathyroidism of renal origin: Secondary | ICD-10-CM | POA: Diagnosis not present

## 2015-04-11 DIAGNOSIS — D509 Iron deficiency anemia, unspecified: Secondary | ICD-10-CM | POA: Diagnosis not present

## 2015-04-11 DIAGNOSIS — E875 Hyperkalemia: Secondary | ICD-10-CM | POA: Diagnosis not present

## 2015-04-11 DIAGNOSIS — N259 Disorder resulting from impaired renal tubular function, unspecified: Secondary | ICD-10-CM | POA: Diagnosis not present

## 2015-04-11 DIAGNOSIS — N186 End stage renal disease: Secondary | ICD-10-CM | POA: Diagnosis not present

## 2015-04-13 DIAGNOSIS — F4321 Adjustment disorder with depressed mood: Secondary | ICD-10-CM | POA: Diagnosis not present

## 2015-04-13 DIAGNOSIS — D699 Hemorrhagic condition, unspecified: Secondary | ICD-10-CM | POA: Diagnosis not present

## 2015-04-13 DIAGNOSIS — D65 Disseminated intravascular coagulation [defibrination syndrome]: Secondary | ICD-10-CM | POA: Diagnosis not present

## 2015-04-13 DIAGNOSIS — Z7901 Long term (current) use of anticoagulants: Secondary | ICD-10-CM | POA: Diagnosis not present

## 2015-04-14 DIAGNOSIS — N186 End stage renal disease: Secondary | ICD-10-CM | POA: Diagnosis not present

## 2015-04-14 DIAGNOSIS — T82838A Hemorrhage of vascular prosthetic devices, implants and grafts, initial encounter: Secondary | ICD-10-CM | POA: Diagnosis not present

## 2015-04-14 DIAGNOSIS — I120 Hypertensive chronic kidney disease with stage 5 chronic kidney disease or end stage renal disease: Secondary | ICD-10-CM | POA: Diagnosis not present

## 2015-04-14 DIAGNOSIS — N259 Disorder resulting from impaired renal tubular function, unspecified: Secondary | ICD-10-CM | POA: Diagnosis not present

## 2015-04-14 DIAGNOSIS — N2581 Secondary hyperparathyroidism of renal origin: Secondary | ICD-10-CM | POA: Diagnosis not present

## 2015-04-14 DIAGNOSIS — E875 Hyperkalemia: Secondary | ICD-10-CM | POA: Diagnosis not present

## 2015-04-14 DIAGNOSIS — D509 Iron deficiency anemia, unspecified: Secondary | ICD-10-CM | POA: Diagnosis not present

## 2015-04-15 DIAGNOSIS — M549 Dorsalgia, unspecified: Secondary | ICD-10-CM | POA: Diagnosis not present

## 2015-04-15 DIAGNOSIS — R269 Unspecified abnormalities of gait and mobility: Secondary | ICD-10-CM | POA: Diagnosis not present

## 2015-04-15 DIAGNOSIS — W19XXXA Unspecified fall, initial encounter: Secondary | ICD-10-CM | POA: Diagnosis not present

## 2015-04-16 DIAGNOSIS — N2581 Secondary hyperparathyroidism of renal origin: Secondary | ICD-10-CM | POA: Diagnosis not present

## 2015-04-16 DIAGNOSIS — E875 Hyperkalemia: Secondary | ICD-10-CM | POA: Diagnosis not present

## 2015-04-16 DIAGNOSIS — D509 Iron deficiency anemia, unspecified: Secondary | ICD-10-CM | POA: Diagnosis not present

## 2015-04-16 DIAGNOSIS — N259 Disorder resulting from impaired renal tubular function, unspecified: Secondary | ICD-10-CM | POA: Diagnosis not present

## 2015-04-16 DIAGNOSIS — N186 End stage renal disease: Secondary | ICD-10-CM | POA: Diagnosis not present

## 2015-04-18 DIAGNOSIS — N186 End stage renal disease: Secondary | ICD-10-CM | POA: Diagnosis not present

## 2015-04-18 DIAGNOSIS — N2581 Secondary hyperparathyroidism of renal origin: Secondary | ICD-10-CM | POA: Diagnosis not present

## 2015-04-18 DIAGNOSIS — D509 Iron deficiency anemia, unspecified: Secondary | ICD-10-CM | POA: Diagnosis not present

## 2015-04-18 DIAGNOSIS — I120 Hypertensive chronic kidney disease with stage 5 chronic kidney disease or end stage renal disease: Secondary | ICD-10-CM | POA: Diagnosis not present

## 2015-04-18 DIAGNOSIS — T82838A Hemorrhage of vascular prosthetic devices, implants and grafts, initial encounter: Secondary | ICD-10-CM | POA: Diagnosis not present

## 2015-04-18 DIAGNOSIS — N259 Disorder resulting from impaired renal tubular function, unspecified: Secondary | ICD-10-CM | POA: Diagnosis not present

## 2015-04-18 DIAGNOSIS — E875 Hyperkalemia: Secondary | ICD-10-CM | POA: Diagnosis not present

## 2015-04-21 DIAGNOSIS — E875 Hyperkalemia: Secondary | ICD-10-CM | POA: Diagnosis not present

## 2015-04-21 DIAGNOSIS — D509 Iron deficiency anemia, unspecified: Secondary | ICD-10-CM | POA: Diagnosis not present

## 2015-04-21 DIAGNOSIS — N259 Disorder resulting from impaired renal tubular function, unspecified: Secondary | ICD-10-CM | POA: Diagnosis not present

## 2015-04-21 DIAGNOSIS — N2581 Secondary hyperparathyroidism of renal origin: Secondary | ICD-10-CM | POA: Diagnosis not present

## 2015-04-21 DIAGNOSIS — N186 End stage renal disease: Secondary | ICD-10-CM | POA: Diagnosis not present

## 2015-04-23 DIAGNOSIS — N186 End stage renal disease: Secondary | ICD-10-CM | POA: Diagnosis not present

## 2015-04-23 DIAGNOSIS — N2581 Secondary hyperparathyroidism of renal origin: Secondary | ICD-10-CM | POA: Diagnosis not present

## 2015-04-23 DIAGNOSIS — D509 Iron deficiency anemia, unspecified: Secondary | ICD-10-CM | POA: Diagnosis not present

## 2015-04-23 DIAGNOSIS — N259 Disorder resulting from impaired renal tubular function, unspecified: Secondary | ICD-10-CM | POA: Diagnosis not present

## 2015-04-23 DIAGNOSIS — E875 Hyperkalemia: Secondary | ICD-10-CM | POA: Diagnosis not present

## 2015-04-25 DIAGNOSIS — N186 End stage renal disease: Secondary | ICD-10-CM | POA: Diagnosis not present

## 2015-04-25 DIAGNOSIS — N259 Disorder resulting from impaired renal tubular function, unspecified: Secondary | ICD-10-CM | POA: Diagnosis not present

## 2015-04-25 DIAGNOSIS — D509 Iron deficiency anemia, unspecified: Secondary | ICD-10-CM | POA: Diagnosis not present

## 2015-04-25 DIAGNOSIS — E875 Hyperkalemia: Secondary | ICD-10-CM | POA: Diagnosis not present

## 2015-04-25 DIAGNOSIS — N2581 Secondary hyperparathyroidism of renal origin: Secondary | ICD-10-CM | POA: Diagnosis not present

## 2015-04-27 DIAGNOSIS — G629 Polyneuropathy, unspecified: Secondary | ICD-10-CM | POA: Diagnosis not present

## 2015-04-27 DIAGNOSIS — F4321 Adjustment disorder with depressed mood: Secondary | ICD-10-CM | POA: Diagnosis not present

## 2015-04-27 DIAGNOSIS — M79605 Pain in left leg: Secondary | ICD-10-CM | POA: Diagnosis not present

## 2015-04-27 DIAGNOSIS — M79604 Pain in right leg: Secondary | ICD-10-CM | POA: Diagnosis not present

## 2015-04-28 DIAGNOSIS — D509 Iron deficiency anemia, unspecified: Secondary | ICD-10-CM | POA: Diagnosis not present

## 2015-04-28 DIAGNOSIS — N186 End stage renal disease: Secondary | ICD-10-CM | POA: Diagnosis not present

## 2015-04-28 DIAGNOSIS — N259 Disorder resulting from impaired renal tubular function, unspecified: Secondary | ICD-10-CM | POA: Diagnosis not present

## 2015-04-28 DIAGNOSIS — N2581 Secondary hyperparathyroidism of renal origin: Secondary | ICD-10-CM | POA: Diagnosis not present

## 2015-04-28 DIAGNOSIS — E875 Hyperkalemia: Secondary | ICD-10-CM | POA: Diagnosis not present

## 2015-04-30 DIAGNOSIS — D699 Hemorrhagic condition, unspecified: Secondary | ICD-10-CM | POA: Diagnosis not present

## 2015-04-30 DIAGNOSIS — N259 Disorder resulting from impaired renal tubular function, unspecified: Secondary | ICD-10-CM | POA: Diagnosis not present

## 2015-04-30 DIAGNOSIS — D509 Iron deficiency anemia, unspecified: Secondary | ICD-10-CM | POA: Diagnosis not present

## 2015-04-30 DIAGNOSIS — N186 End stage renal disease: Secondary | ICD-10-CM | POA: Diagnosis not present

## 2015-04-30 DIAGNOSIS — E875 Hyperkalemia: Secondary | ICD-10-CM | POA: Diagnosis not present

## 2015-04-30 DIAGNOSIS — N2581 Secondary hyperparathyroidism of renal origin: Secondary | ICD-10-CM | POA: Diagnosis not present

## 2015-05-02 DIAGNOSIS — E875 Hyperkalemia: Secondary | ICD-10-CM | POA: Diagnosis not present

## 2015-05-02 DIAGNOSIS — D509 Iron deficiency anemia, unspecified: Secondary | ICD-10-CM | POA: Diagnosis not present

## 2015-05-02 DIAGNOSIS — N2581 Secondary hyperparathyroidism of renal origin: Secondary | ICD-10-CM | POA: Diagnosis not present

## 2015-05-02 DIAGNOSIS — N259 Disorder resulting from impaired renal tubular function, unspecified: Secondary | ICD-10-CM | POA: Diagnosis not present

## 2015-05-02 DIAGNOSIS — N186 End stage renal disease: Secondary | ICD-10-CM | POA: Diagnosis not present

## 2015-05-05 DIAGNOSIS — N186 End stage renal disease: Secondary | ICD-10-CM | POA: Diagnosis not present

## 2015-05-05 DIAGNOSIS — E875 Hyperkalemia: Secondary | ICD-10-CM | POA: Diagnosis not present

## 2015-05-05 DIAGNOSIS — D509 Iron deficiency anemia, unspecified: Secondary | ICD-10-CM | POA: Diagnosis not present

## 2015-05-05 DIAGNOSIS — N259 Disorder resulting from impaired renal tubular function, unspecified: Secondary | ICD-10-CM | POA: Diagnosis not present

## 2015-05-05 DIAGNOSIS — N2581 Secondary hyperparathyroidism of renal origin: Secondary | ICD-10-CM | POA: Diagnosis not present

## 2015-05-06 DIAGNOSIS — N186 End stage renal disease: Secondary | ICD-10-CM | POA: Diagnosis not present

## 2015-05-07 DIAGNOSIS — I120 Hypertensive chronic kidney disease with stage 5 chronic kidney disease or end stage renal disease: Secondary | ICD-10-CM | POA: Diagnosis not present

## 2015-05-07 DIAGNOSIS — E875 Hyperkalemia: Secondary | ICD-10-CM | POA: Diagnosis not present

## 2015-05-07 DIAGNOSIS — N259 Disorder resulting from impaired renal tubular function, unspecified: Secondary | ICD-10-CM | POA: Diagnosis not present

## 2015-05-07 DIAGNOSIS — R7989 Other specified abnormal findings of blood chemistry: Secondary | ICD-10-CM | POA: Diagnosis not present

## 2015-05-07 DIAGNOSIS — D509 Iron deficiency anemia, unspecified: Secondary | ICD-10-CM | POA: Diagnosis not present

## 2015-05-07 DIAGNOSIS — I4891 Unspecified atrial fibrillation: Secondary | ICD-10-CM | POA: Diagnosis not present

## 2015-05-07 DIAGNOSIS — N2589 Other disorders resulting from impaired renal tubular function: Secondary | ICD-10-CM | POA: Diagnosis not present

## 2015-05-07 DIAGNOSIS — Z992 Dependence on renal dialysis: Secondary | ICD-10-CM | POA: Diagnosis not present

## 2015-05-07 DIAGNOSIS — N186 End stage renal disease: Secondary | ICD-10-CM | POA: Diagnosis not present

## 2015-05-09 DIAGNOSIS — E875 Hyperkalemia: Secondary | ICD-10-CM | POA: Diagnosis not present

## 2015-05-09 DIAGNOSIS — N259 Disorder resulting from impaired renal tubular function, unspecified: Secondary | ICD-10-CM | POA: Diagnosis not present

## 2015-05-09 DIAGNOSIS — R7989 Other specified abnormal findings of blood chemistry: Secondary | ICD-10-CM | POA: Diagnosis not present

## 2015-05-09 DIAGNOSIS — N186 End stage renal disease: Secondary | ICD-10-CM | POA: Diagnosis not present

## 2015-05-09 DIAGNOSIS — D509 Iron deficiency anemia, unspecified: Secondary | ICD-10-CM | POA: Diagnosis not present

## 2015-05-12 DIAGNOSIS — D509 Iron deficiency anemia, unspecified: Secondary | ICD-10-CM | POA: Diagnosis not present

## 2015-05-12 DIAGNOSIS — E875 Hyperkalemia: Secondary | ICD-10-CM | POA: Diagnosis not present

## 2015-05-12 DIAGNOSIS — R7989 Other specified abnormal findings of blood chemistry: Secondary | ICD-10-CM | POA: Diagnosis not present

## 2015-05-12 DIAGNOSIS — N186 End stage renal disease: Secondary | ICD-10-CM | POA: Diagnosis not present

## 2015-05-12 DIAGNOSIS — N259 Disorder resulting from impaired renal tubular function, unspecified: Secondary | ICD-10-CM | POA: Diagnosis not present

## 2015-05-14 DIAGNOSIS — E875 Hyperkalemia: Secondary | ICD-10-CM | POA: Diagnosis not present

## 2015-05-14 DIAGNOSIS — N259 Disorder resulting from impaired renal tubular function, unspecified: Secondary | ICD-10-CM | POA: Diagnosis not present

## 2015-05-14 DIAGNOSIS — D509 Iron deficiency anemia, unspecified: Secondary | ICD-10-CM | POA: Diagnosis not present

## 2015-05-14 DIAGNOSIS — R7989 Other specified abnormal findings of blood chemistry: Secondary | ICD-10-CM | POA: Diagnosis not present

## 2015-05-14 DIAGNOSIS — N186 End stage renal disease: Secondary | ICD-10-CM | POA: Diagnosis not present

## 2015-05-15 DIAGNOSIS — E213 Hyperparathyroidism, unspecified: Secondary | ICD-10-CM | POA: Diagnosis not present

## 2015-05-15 DIAGNOSIS — M62838 Other muscle spasm: Secondary | ICD-10-CM | POA: Diagnosis not present

## 2015-05-16 DIAGNOSIS — D509 Iron deficiency anemia, unspecified: Secondary | ICD-10-CM | POA: Diagnosis not present

## 2015-05-16 DIAGNOSIS — N259 Disorder resulting from impaired renal tubular function, unspecified: Secondary | ICD-10-CM | POA: Diagnosis not present

## 2015-05-16 DIAGNOSIS — R7989 Other specified abnormal findings of blood chemistry: Secondary | ICD-10-CM | POA: Diagnosis not present

## 2015-05-16 DIAGNOSIS — N186 End stage renal disease: Secondary | ICD-10-CM | POA: Diagnosis not present

## 2015-05-16 DIAGNOSIS — E875 Hyperkalemia: Secondary | ICD-10-CM | POA: Diagnosis not present

## 2015-05-18 DIAGNOSIS — F4321 Adjustment disorder with depressed mood: Secondary | ICD-10-CM | POA: Diagnosis not present

## 2015-05-19 DIAGNOSIS — D509 Iron deficiency anemia, unspecified: Secondary | ICD-10-CM | POA: Diagnosis not present

## 2015-05-19 DIAGNOSIS — N186 End stage renal disease: Secondary | ICD-10-CM | POA: Diagnosis not present

## 2015-05-19 DIAGNOSIS — E875 Hyperkalemia: Secondary | ICD-10-CM | POA: Diagnosis not present

## 2015-05-19 DIAGNOSIS — N259 Disorder resulting from impaired renal tubular function, unspecified: Secondary | ICD-10-CM | POA: Diagnosis not present

## 2015-05-19 DIAGNOSIS — R7989 Other specified abnormal findings of blood chemistry: Secondary | ICD-10-CM | POA: Diagnosis not present

## 2015-05-21 DIAGNOSIS — D509 Iron deficiency anemia, unspecified: Secondary | ICD-10-CM | POA: Diagnosis not present

## 2015-05-21 DIAGNOSIS — R509 Fever, unspecified: Secondary | ICD-10-CM | POA: Diagnosis not present

## 2015-05-21 DIAGNOSIS — N259 Disorder resulting from impaired renal tubular function, unspecified: Secondary | ICD-10-CM | POA: Diagnosis not present

## 2015-05-21 DIAGNOSIS — N186 End stage renal disease: Secondary | ICD-10-CM | POA: Diagnosis not present

## 2015-05-21 DIAGNOSIS — E875 Hyperkalemia: Secondary | ICD-10-CM | POA: Diagnosis not present

## 2015-05-21 DIAGNOSIS — R7989 Other specified abnormal findings of blood chemistry: Secondary | ICD-10-CM | POA: Diagnosis not present

## 2015-05-21 DIAGNOSIS — K6289 Other specified diseases of anus and rectum: Secondary | ICD-10-CM | POA: Diagnosis not present

## 2015-05-23 DIAGNOSIS — E875 Hyperkalemia: Secondary | ICD-10-CM | POA: Diagnosis not present

## 2015-05-23 DIAGNOSIS — N186 End stage renal disease: Secondary | ICD-10-CM | POA: Diagnosis not present

## 2015-05-23 DIAGNOSIS — R7989 Other specified abnormal findings of blood chemistry: Secondary | ICD-10-CM | POA: Diagnosis not present

## 2015-05-23 DIAGNOSIS — N259 Disorder resulting from impaired renal tubular function, unspecified: Secondary | ICD-10-CM | POA: Diagnosis not present

## 2015-05-23 DIAGNOSIS — D509 Iron deficiency anemia, unspecified: Secondary | ICD-10-CM | POA: Diagnosis not present

## 2015-05-25 DIAGNOSIS — K610 Anal abscess: Secondary | ICD-10-CM | POA: Diagnosis not present

## 2015-05-26 DIAGNOSIS — R7989 Other specified abnormal findings of blood chemistry: Secondary | ICD-10-CM | POA: Diagnosis not present

## 2015-05-26 DIAGNOSIS — N259 Disorder resulting from impaired renal tubular function, unspecified: Secondary | ICD-10-CM | POA: Diagnosis not present

## 2015-05-26 DIAGNOSIS — Z7901 Long term (current) use of anticoagulants: Secondary | ICD-10-CM | POA: Diagnosis not present

## 2015-05-26 DIAGNOSIS — D509 Iron deficiency anemia, unspecified: Secondary | ICD-10-CM | POA: Diagnosis not present

## 2015-05-26 DIAGNOSIS — N186 End stage renal disease: Secondary | ICD-10-CM | POA: Diagnosis not present

## 2015-05-26 DIAGNOSIS — E875 Hyperkalemia: Secondary | ICD-10-CM | POA: Diagnosis not present

## 2015-05-27 DIAGNOSIS — Z7901 Long term (current) use of anticoagulants: Secondary | ICD-10-CM | POA: Diagnosis not present

## 2015-05-28 DIAGNOSIS — E875 Hyperkalemia: Secondary | ICD-10-CM | POA: Diagnosis not present

## 2015-05-28 DIAGNOSIS — N259 Disorder resulting from impaired renal tubular function, unspecified: Secondary | ICD-10-CM | POA: Diagnosis not present

## 2015-05-28 DIAGNOSIS — D649 Anemia, unspecified: Secondary | ICD-10-CM | POA: Diagnosis not present

## 2015-05-28 DIAGNOSIS — K610 Anal abscess: Secondary | ICD-10-CM | POA: Diagnosis not present

## 2015-05-28 DIAGNOSIS — R7989 Other specified abnormal findings of blood chemistry: Secondary | ICD-10-CM | POA: Diagnosis not present

## 2015-05-28 DIAGNOSIS — I120 Hypertensive chronic kidney disease with stage 5 chronic kidney disease or end stage renal disease: Secondary | ICD-10-CM | POA: Diagnosis not present

## 2015-05-28 DIAGNOSIS — N186 End stage renal disease: Secondary | ICD-10-CM | POA: Diagnosis not present

## 2015-05-28 DIAGNOSIS — D509 Iron deficiency anemia, unspecified: Secondary | ICD-10-CM | POA: Diagnosis not present

## 2015-05-29 DIAGNOSIS — Z7901 Long term (current) use of anticoagulants: Secondary | ICD-10-CM | POA: Diagnosis not present

## 2015-05-29 DIAGNOSIS — K610 Anal abscess: Secondary | ICD-10-CM | POA: Diagnosis not present

## 2015-05-29 DIAGNOSIS — N186 End stage renal disease: Secondary | ICD-10-CM | POA: Diagnosis not present

## 2015-05-29 DIAGNOSIS — I10 Essential (primary) hypertension: Secondary | ICD-10-CM | POA: Diagnosis not present

## 2015-05-29 DIAGNOSIS — I482 Chronic atrial fibrillation: Secondary | ICD-10-CM | POA: Diagnosis not present

## 2015-05-29 DIAGNOSIS — I4891 Unspecified atrial fibrillation: Secondary | ICD-10-CM | POA: Diagnosis not present

## 2015-05-30 DIAGNOSIS — N186 End stage renal disease: Secondary | ICD-10-CM | POA: Diagnosis not present

## 2015-05-30 DIAGNOSIS — N259 Disorder resulting from impaired renal tubular function, unspecified: Secondary | ICD-10-CM | POA: Diagnosis not present

## 2015-05-30 DIAGNOSIS — D509 Iron deficiency anemia, unspecified: Secondary | ICD-10-CM | POA: Diagnosis not present

## 2015-05-30 DIAGNOSIS — K610 Anal abscess: Secondary | ICD-10-CM | POA: Diagnosis not present

## 2015-05-30 DIAGNOSIS — E875 Hyperkalemia: Secondary | ICD-10-CM | POA: Diagnosis not present

## 2015-05-30 DIAGNOSIS — R7989 Other specified abnormal findings of blood chemistry: Secondary | ICD-10-CM | POA: Diagnosis not present

## 2015-06-01 DIAGNOSIS — K9189 Other postprocedural complications and disorders of digestive system: Secondary | ICD-10-CM | POA: Diagnosis not present

## 2015-06-01 DIAGNOSIS — K610 Anal abscess: Secondary | ICD-10-CM | POA: Diagnosis not present

## 2015-06-01 DIAGNOSIS — Z4801 Encounter for change or removal of surgical wound dressing: Secondary | ICD-10-CM | POA: Diagnosis not present

## 2015-06-01 DIAGNOSIS — F4321 Adjustment disorder with depressed mood: Secondary | ICD-10-CM | POA: Diagnosis not present

## 2015-06-01 DIAGNOSIS — G8918 Other acute postprocedural pain: Secondary | ICD-10-CM | POA: Diagnosis not present

## 2015-06-02 DIAGNOSIS — D509 Iron deficiency anemia, unspecified: Secondary | ICD-10-CM | POA: Diagnosis not present

## 2015-06-02 DIAGNOSIS — E875 Hyperkalemia: Secondary | ICD-10-CM | POA: Diagnosis not present

## 2015-06-02 DIAGNOSIS — N186 End stage renal disease: Secondary | ICD-10-CM | POA: Diagnosis not present

## 2015-06-02 DIAGNOSIS — R7989 Other specified abnormal findings of blood chemistry: Secondary | ICD-10-CM | POA: Diagnosis not present

## 2015-06-02 DIAGNOSIS — N259 Disorder resulting from impaired renal tubular function, unspecified: Secondary | ICD-10-CM | POA: Diagnosis not present

## 2015-06-03 DIAGNOSIS — N186 End stage renal disease: Secondary | ICD-10-CM | POA: Diagnosis not present

## 2015-06-04 DIAGNOSIS — D509 Iron deficiency anemia, unspecified: Secondary | ICD-10-CM | POA: Diagnosis not present

## 2015-06-04 DIAGNOSIS — N2589 Other disorders resulting from impaired renal tubular function: Secondary | ICD-10-CM | POA: Diagnosis not present

## 2015-06-04 DIAGNOSIS — N186 End stage renal disease: Secondary | ICD-10-CM | POA: Diagnosis not present

## 2015-06-04 DIAGNOSIS — N2581 Secondary hyperparathyroidism of renal origin: Secondary | ICD-10-CM | POA: Diagnosis not present

## 2015-06-04 DIAGNOSIS — E875 Hyperkalemia: Secondary | ICD-10-CM | POA: Diagnosis not present

## 2015-06-04 DIAGNOSIS — N259 Disorder resulting from impaired renal tubular function, unspecified: Secondary | ICD-10-CM | POA: Diagnosis not present

## 2015-06-06 DIAGNOSIS — D509 Iron deficiency anemia, unspecified: Secondary | ICD-10-CM | POA: Diagnosis not present

## 2015-06-06 DIAGNOSIS — N186 End stage renal disease: Secondary | ICD-10-CM | POA: Diagnosis not present

## 2015-06-06 DIAGNOSIS — N259 Disorder resulting from impaired renal tubular function, unspecified: Secondary | ICD-10-CM | POA: Diagnosis not present

## 2015-06-06 DIAGNOSIS — N2589 Other disorders resulting from impaired renal tubular function: Secondary | ICD-10-CM | POA: Diagnosis not present

## 2015-06-06 DIAGNOSIS — N2581 Secondary hyperparathyroidism of renal origin: Secondary | ICD-10-CM | POA: Diagnosis not present

## 2015-06-08 DIAGNOSIS — S31839D Unspecified open wound of anus, subsequent encounter: Secondary | ICD-10-CM | POA: Diagnosis not present

## 2015-06-08 DIAGNOSIS — N186 End stage renal disease: Secondary | ICD-10-CM | POA: Diagnosis not present

## 2015-06-08 DIAGNOSIS — I4891 Unspecified atrial fibrillation: Secondary | ICD-10-CM | POA: Diagnosis not present

## 2015-06-08 DIAGNOSIS — F4321 Adjustment disorder with depressed mood: Secondary | ICD-10-CM | POA: Diagnosis not present

## 2015-06-08 DIAGNOSIS — Z4801 Encounter for change or removal of surgical wound dressing: Secondary | ICD-10-CM | POA: Diagnosis not present

## 2015-06-08 DIAGNOSIS — K9189 Other postprocedural complications and disorders of digestive system: Secondary | ICD-10-CM | POA: Diagnosis not present

## 2015-06-08 DIAGNOSIS — I120 Hypertensive chronic kidney disease with stage 5 chronic kidney disease or end stage renal disease: Secondary | ICD-10-CM | POA: Diagnosis not present

## 2015-06-09 DIAGNOSIS — N186 End stage renal disease: Secondary | ICD-10-CM | POA: Diagnosis not present

## 2015-06-09 DIAGNOSIS — D509 Iron deficiency anemia, unspecified: Secondary | ICD-10-CM | POA: Diagnosis not present

## 2015-06-09 DIAGNOSIS — N2581 Secondary hyperparathyroidism of renal origin: Secondary | ICD-10-CM | POA: Diagnosis not present

## 2015-06-09 DIAGNOSIS — N259 Disorder resulting from impaired renal tubular function, unspecified: Secondary | ICD-10-CM | POA: Diagnosis not present

## 2015-06-09 DIAGNOSIS — N2589 Other disorders resulting from impaired renal tubular function: Secondary | ICD-10-CM | POA: Diagnosis not present

## 2015-06-11 DIAGNOSIS — N2581 Secondary hyperparathyroidism of renal origin: Secondary | ICD-10-CM | POA: Diagnosis not present

## 2015-06-11 DIAGNOSIS — D509 Iron deficiency anemia, unspecified: Secondary | ICD-10-CM | POA: Diagnosis not present

## 2015-06-11 DIAGNOSIS — N186 End stage renal disease: Secondary | ICD-10-CM | POA: Diagnosis not present

## 2015-06-11 DIAGNOSIS — Z7901 Long term (current) use of anticoagulants: Secondary | ICD-10-CM | POA: Diagnosis not present

## 2015-06-11 DIAGNOSIS — N259 Disorder resulting from impaired renal tubular function, unspecified: Secondary | ICD-10-CM | POA: Diagnosis not present

## 2015-06-11 DIAGNOSIS — N2589 Other disorders resulting from impaired renal tubular function: Secondary | ICD-10-CM | POA: Diagnosis not present

## 2015-06-13 DIAGNOSIS — N259 Disorder resulting from impaired renal tubular function, unspecified: Secondary | ICD-10-CM | POA: Diagnosis not present

## 2015-06-13 DIAGNOSIS — N2589 Other disorders resulting from impaired renal tubular function: Secondary | ICD-10-CM | POA: Diagnosis not present

## 2015-06-13 DIAGNOSIS — N186 End stage renal disease: Secondary | ICD-10-CM | POA: Diagnosis not present

## 2015-06-13 DIAGNOSIS — D509 Iron deficiency anemia, unspecified: Secondary | ICD-10-CM | POA: Diagnosis not present

## 2015-06-13 DIAGNOSIS — N2581 Secondary hyperparathyroidism of renal origin: Secondary | ICD-10-CM | POA: Diagnosis not present

## 2015-06-15 DIAGNOSIS — S31839D Unspecified open wound of anus, subsequent encounter: Secondary | ICD-10-CM | POA: Diagnosis not present

## 2015-06-16 DIAGNOSIS — N186 End stage renal disease: Secondary | ICD-10-CM | POA: Diagnosis not present

## 2015-06-16 DIAGNOSIS — N2589 Other disorders resulting from impaired renal tubular function: Secondary | ICD-10-CM | POA: Diagnosis not present

## 2015-06-16 DIAGNOSIS — N259 Disorder resulting from impaired renal tubular function, unspecified: Secondary | ICD-10-CM | POA: Diagnosis not present

## 2015-06-16 DIAGNOSIS — N2581 Secondary hyperparathyroidism of renal origin: Secondary | ICD-10-CM | POA: Diagnosis not present

## 2015-06-16 DIAGNOSIS — D509 Iron deficiency anemia, unspecified: Secondary | ICD-10-CM | POA: Diagnosis not present

## 2015-06-18 DIAGNOSIS — D509 Iron deficiency anemia, unspecified: Secondary | ICD-10-CM | POA: Diagnosis not present

## 2015-06-18 DIAGNOSIS — N2581 Secondary hyperparathyroidism of renal origin: Secondary | ICD-10-CM | POA: Diagnosis not present

## 2015-06-18 DIAGNOSIS — N259 Disorder resulting from impaired renal tubular function, unspecified: Secondary | ICD-10-CM | POA: Diagnosis not present

## 2015-06-18 DIAGNOSIS — N2589 Other disorders resulting from impaired renal tubular function: Secondary | ICD-10-CM | POA: Diagnosis not present

## 2015-06-18 DIAGNOSIS — N186 End stage renal disease: Secondary | ICD-10-CM | POA: Diagnosis not present

## 2015-06-22 DIAGNOSIS — F4321 Adjustment disorder with depressed mood: Secondary | ICD-10-CM | POA: Diagnosis not present

## 2015-06-23 DIAGNOSIS — D689 Coagulation defect, unspecified: Secondary | ICD-10-CM | POA: Diagnosis not present

## 2015-06-23 DIAGNOSIS — S31839D Unspecified open wound of anus, subsequent encounter: Secondary | ICD-10-CM | POA: Diagnosis not present

## 2015-06-23 DIAGNOSIS — N259 Disorder resulting from impaired renal tubular function, unspecified: Secondary | ICD-10-CM | POA: Diagnosis not present

## 2015-06-23 DIAGNOSIS — N2581 Secondary hyperparathyroidism of renal origin: Secondary | ICD-10-CM | POA: Diagnosis not present

## 2015-06-23 DIAGNOSIS — N186 End stage renal disease: Secondary | ICD-10-CM | POA: Diagnosis not present

## 2015-06-23 DIAGNOSIS — N2589 Other disorders resulting from impaired renal tubular function: Secondary | ICD-10-CM | POA: Diagnosis not present

## 2015-06-23 DIAGNOSIS — D509 Iron deficiency anemia, unspecified: Secondary | ICD-10-CM | POA: Diagnosis not present

## 2015-06-25 DIAGNOSIS — N186 End stage renal disease: Secondary | ICD-10-CM | POA: Diagnosis not present

## 2015-06-25 DIAGNOSIS — N259 Disorder resulting from impaired renal tubular function, unspecified: Secondary | ICD-10-CM | POA: Diagnosis not present

## 2015-06-25 DIAGNOSIS — D509 Iron deficiency anemia, unspecified: Secondary | ICD-10-CM | POA: Diagnosis not present

## 2015-06-25 DIAGNOSIS — N2581 Secondary hyperparathyroidism of renal origin: Secondary | ICD-10-CM | POA: Diagnosis not present

## 2015-06-25 DIAGNOSIS — N2589 Other disorders resulting from impaired renal tubular function: Secondary | ICD-10-CM | POA: Diagnosis not present

## 2015-06-29 DIAGNOSIS — Z7901 Long term (current) use of anticoagulants: Secondary | ICD-10-CM | POA: Diagnosis not present

## 2015-06-29 DIAGNOSIS — F4321 Adjustment disorder with depressed mood: Secondary | ICD-10-CM | POA: Diagnosis not present

## 2015-06-30 DIAGNOSIS — N2581 Secondary hyperparathyroidism of renal origin: Secondary | ICD-10-CM | POA: Diagnosis not present

## 2015-06-30 DIAGNOSIS — N2589 Other disorders resulting from impaired renal tubular function: Secondary | ICD-10-CM | POA: Diagnosis not present

## 2015-06-30 DIAGNOSIS — D509 Iron deficiency anemia, unspecified: Secondary | ICD-10-CM | POA: Diagnosis not present

## 2015-06-30 DIAGNOSIS — N259 Disorder resulting from impaired renal tubular function, unspecified: Secondary | ICD-10-CM | POA: Diagnosis not present

## 2015-06-30 DIAGNOSIS — N186 End stage renal disease: Secondary | ICD-10-CM | POA: Diagnosis not present

## 2015-07-02 DIAGNOSIS — N186 End stage renal disease: Secondary | ICD-10-CM | POA: Diagnosis not present

## 2015-07-02 DIAGNOSIS — E213 Hyperparathyroidism, unspecified: Secondary | ICD-10-CM | POA: Diagnosis not present

## 2015-07-02 DIAGNOSIS — N259 Disorder resulting from impaired renal tubular function, unspecified: Secondary | ICD-10-CM | POA: Diagnosis not present

## 2015-07-02 DIAGNOSIS — I120 Hypertensive chronic kidney disease with stage 5 chronic kidney disease or end stage renal disease: Secondary | ICD-10-CM | POA: Diagnosis not present

## 2015-07-02 DIAGNOSIS — D509 Iron deficiency anemia, unspecified: Secondary | ICD-10-CM | POA: Diagnosis not present

## 2015-07-02 DIAGNOSIS — I4891 Unspecified atrial fibrillation: Secondary | ICD-10-CM | POA: Diagnosis not present

## 2015-07-02 DIAGNOSIS — N2581 Secondary hyperparathyroidism of renal origin: Secondary | ICD-10-CM | POA: Diagnosis not present

## 2015-07-02 DIAGNOSIS — N2589 Other disorders resulting from impaired renal tubular function: Secondary | ICD-10-CM | POA: Diagnosis not present

## 2015-07-04 DIAGNOSIS — N2589 Other disorders resulting from impaired renal tubular function: Secondary | ICD-10-CM | POA: Diagnosis not present

## 2015-07-04 DIAGNOSIS — E875 Hyperkalemia: Secondary | ICD-10-CM | POA: Diagnosis not present

## 2015-07-04 DIAGNOSIS — R7989 Other specified abnormal findings of blood chemistry: Secondary | ICD-10-CM | POA: Diagnosis not present

## 2015-07-04 DIAGNOSIS — N259 Disorder resulting from impaired renal tubular function, unspecified: Secondary | ICD-10-CM | POA: Diagnosis not present

## 2015-07-04 DIAGNOSIS — N186 End stage renal disease: Secondary | ICD-10-CM | POA: Diagnosis not present

## 2015-07-04 DIAGNOSIS — D509 Iron deficiency anemia, unspecified: Secondary | ICD-10-CM | POA: Diagnosis not present

## 2015-07-06 DIAGNOSIS — Z7901 Long term (current) use of anticoagulants: Secondary | ICD-10-CM | POA: Diagnosis not present

## 2015-07-07 DIAGNOSIS — R7989 Other specified abnormal findings of blood chemistry: Secondary | ICD-10-CM | POA: Diagnosis not present

## 2015-07-07 DIAGNOSIS — E875 Hyperkalemia: Secondary | ICD-10-CM | POA: Diagnosis not present

## 2015-07-07 DIAGNOSIS — D509 Iron deficiency anemia, unspecified: Secondary | ICD-10-CM | POA: Diagnosis not present

## 2015-07-07 DIAGNOSIS — N259 Disorder resulting from impaired renal tubular function, unspecified: Secondary | ICD-10-CM | POA: Diagnosis not present

## 2015-07-07 DIAGNOSIS — N186 End stage renal disease: Secondary | ICD-10-CM | POA: Diagnosis not present

## 2015-07-09 DIAGNOSIS — N186 End stage renal disease: Secondary | ICD-10-CM | POA: Diagnosis not present

## 2015-07-09 DIAGNOSIS — N259 Disorder resulting from impaired renal tubular function, unspecified: Secondary | ICD-10-CM | POA: Diagnosis not present

## 2015-07-09 DIAGNOSIS — R7989 Other specified abnormal findings of blood chemistry: Secondary | ICD-10-CM | POA: Diagnosis not present

## 2015-07-09 DIAGNOSIS — E875 Hyperkalemia: Secondary | ICD-10-CM | POA: Diagnosis not present

## 2015-07-09 DIAGNOSIS — D509 Iron deficiency anemia, unspecified: Secondary | ICD-10-CM | POA: Diagnosis not present

## 2015-07-11 DIAGNOSIS — N186 End stage renal disease: Secondary | ICD-10-CM | POA: Diagnosis not present

## 2015-07-11 DIAGNOSIS — N259 Disorder resulting from impaired renal tubular function, unspecified: Secondary | ICD-10-CM | POA: Diagnosis not present

## 2015-07-11 DIAGNOSIS — E875 Hyperkalemia: Secondary | ICD-10-CM | POA: Diagnosis not present

## 2015-07-11 DIAGNOSIS — R7989 Other specified abnormal findings of blood chemistry: Secondary | ICD-10-CM | POA: Diagnosis not present

## 2015-07-11 DIAGNOSIS — D509 Iron deficiency anemia, unspecified: Secondary | ICD-10-CM | POA: Diagnosis not present

## 2015-07-13 DIAGNOSIS — F4321 Adjustment disorder with depressed mood: Secondary | ICD-10-CM | POA: Diagnosis not present

## 2015-07-13 DIAGNOSIS — Z7901 Long term (current) use of anticoagulants: Secondary | ICD-10-CM | POA: Diagnosis not present

## 2015-07-13 DIAGNOSIS — D699 Hemorrhagic condition, unspecified: Secondary | ICD-10-CM | POA: Diagnosis not present

## 2015-07-14 DIAGNOSIS — M62838 Other muscle spasm: Secondary | ICD-10-CM | POA: Diagnosis not present

## 2015-07-14 DIAGNOSIS — E875 Hyperkalemia: Secondary | ICD-10-CM | POA: Diagnosis not present

## 2015-07-14 DIAGNOSIS — D509 Iron deficiency anemia, unspecified: Secondary | ICD-10-CM | POA: Diagnosis not present

## 2015-07-14 DIAGNOSIS — R7989 Other specified abnormal findings of blood chemistry: Secondary | ICD-10-CM | POA: Diagnosis not present

## 2015-07-14 DIAGNOSIS — M79604 Pain in right leg: Secondary | ICD-10-CM | POA: Diagnosis not present

## 2015-07-14 DIAGNOSIS — N186 End stage renal disease: Secondary | ICD-10-CM | POA: Diagnosis not present

## 2015-07-14 DIAGNOSIS — N259 Disorder resulting from impaired renal tubular function, unspecified: Secondary | ICD-10-CM | POA: Diagnosis not present

## 2015-07-14 DIAGNOSIS — M79605 Pain in left leg: Secondary | ICD-10-CM | POA: Diagnosis not present

## 2015-07-16 DIAGNOSIS — N259 Disorder resulting from impaired renal tubular function, unspecified: Secondary | ICD-10-CM | POA: Diagnosis not present

## 2015-07-16 DIAGNOSIS — E875 Hyperkalemia: Secondary | ICD-10-CM | POA: Diagnosis not present

## 2015-07-16 DIAGNOSIS — N186 End stage renal disease: Secondary | ICD-10-CM | POA: Diagnosis not present

## 2015-07-16 DIAGNOSIS — D509 Iron deficiency anemia, unspecified: Secondary | ICD-10-CM | POA: Diagnosis not present

## 2015-07-16 DIAGNOSIS — R7989 Other specified abnormal findings of blood chemistry: Secondary | ICD-10-CM | POA: Diagnosis not present

## 2015-07-18 DIAGNOSIS — N186 End stage renal disease: Secondary | ICD-10-CM | POA: Diagnosis not present

## 2015-07-18 DIAGNOSIS — D509 Iron deficiency anemia, unspecified: Secondary | ICD-10-CM | POA: Diagnosis not present

## 2015-07-18 DIAGNOSIS — E875 Hyperkalemia: Secondary | ICD-10-CM | POA: Diagnosis not present

## 2015-07-18 DIAGNOSIS — N259 Disorder resulting from impaired renal tubular function, unspecified: Secondary | ICD-10-CM | POA: Diagnosis not present

## 2015-07-18 DIAGNOSIS — R7989 Other specified abnormal findings of blood chemistry: Secondary | ICD-10-CM | POA: Diagnosis not present

## 2015-07-20 DIAGNOSIS — Z7901 Long term (current) use of anticoagulants: Secondary | ICD-10-CM | POA: Diagnosis not present

## 2015-07-21 DIAGNOSIS — N259 Disorder resulting from impaired renal tubular function, unspecified: Secondary | ICD-10-CM | POA: Diagnosis not present

## 2015-07-21 DIAGNOSIS — R7989 Other specified abnormal findings of blood chemistry: Secondary | ICD-10-CM | POA: Diagnosis not present

## 2015-07-21 DIAGNOSIS — N186 End stage renal disease: Secondary | ICD-10-CM | POA: Diagnosis not present

## 2015-07-21 DIAGNOSIS — E875 Hyperkalemia: Secondary | ICD-10-CM | POA: Diagnosis not present

## 2015-07-21 DIAGNOSIS — T82868A Thrombosis of vascular prosthetic devices, implants and grafts, initial encounter: Secondary | ICD-10-CM | POA: Diagnosis not present

## 2015-07-21 DIAGNOSIS — D509 Iron deficiency anemia, unspecified: Secondary | ICD-10-CM | POA: Diagnosis not present

## 2015-07-21 DIAGNOSIS — I120 Hypertensive chronic kidney disease with stage 5 chronic kidney disease or end stage renal disease: Secondary | ICD-10-CM | POA: Diagnosis not present

## 2015-07-21 DIAGNOSIS — I770 Arteriovenous fistula, acquired: Secondary | ICD-10-CM | POA: Diagnosis not present

## 2015-07-22 DIAGNOSIS — L600 Ingrowing nail: Secondary | ICD-10-CM | POA: Diagnosis not present

## 2015-07-22 DIAGNOSIS — I7100 Dissection of unspecified site of aorta: Secondary | ICD-10-CM | POA: Diagnosis not present

## 2015-07-22 DIAGNOSIS — I10 Essential (primary) hypertension: Secondary | ICD-10-CM | POA: Diagnosis not present

## 2015-07-23 DIAGNOSIS — E875 Hyperkalemia: Secondary | ICD-10-CM | POA: Diagnosis not present

## 2015-07-23 DIAGNOSIS — R7989 Other specified abnormal findings of blood chemistry: Secondary | ICD-10-CM | POA: Diagnosis not present

## 2015-07-23 DIAGNOSIS — D509 Iron deficiency anemia, unspecified: Secondary | ICD-10-CM | POA: Diagnosis not present

## 2015-07-23 DIAGNOSIS — N259 Disorder resulting from impaired renal tubular function, unspecified: Secondary | ICD-10-CM | POA: Diagnosis not present

## 2015-07-23 DIAGNOSIS — N186 End stage renal disease: Secondary | ICD-10-CM | POA: Diagnosis not present

## 2015-07-25 DIAGNOSIS — R7989 Other specified abnormal findings of blood chemistry: Secondary | ICD-10-CM | POA: Diagnosis not present

## 2015-07-25 DIAGNOSIS — D509 Iron deficiency anemia, unspecified: Secondary | ICD-10-CM | POA: Diagnosis not present

## 2015-07-25 DIAGNOSIS — N259 Disorder resulting from impaired renal tubular function, unspecified: Secondary | ICD-10-CM | POA: Diagnosis not present

## 2015-07-25 DIAGNOSIS — E875 Hyperkalemia: Secondary | ICD-10-CM | POA: Diagnosis not present

## 2015-07-25 DIAGNOSIS — N186 End stage renal disease: Secondary | ICD-10-CM | POA: Diagnosis not present

## 2015-07-27 DIAGNOSIS — Z7901 Long term (current) use of anticoagulants: Secondary | ICD-10-CM | POA: Diagnosis not present

## 2015-07-27 DIAGNOSIS — F4321 Adjustment disorder with depressed mood: Secondary | ICD-10-CM | POA: Diagnosis not present

## 2015-07-28 DIAGNOSIS — N259 Disorder resulting from impaired renal tubular function, unspecified: Secondary | ICD-10-CM | POA: Diagnosis not present

## 2015-07-28 DIAGNOSIS — R7989 Other specified abnormal findings of blood chemistry: Secondary | ICD-10-CM | POA: Diagnosis not present

## 2015-07-28 DIAGNOSIS — N186 End stage renal disease: Secondary | ICD-10-CM | POA: Diagnosis not present

## 2015-07-28 DIAGNOSIS — E875 Hyperkalemia: Secondary | ICD-10-CM | POA: Diagnosis not present

## 2015-07-28 DIAGNOSIS — D509 Iron deficiency anemia, unspecified: Secondary | ICD-10-CM | POA: Diagnosis not present

## 2015-07-30 DIAGNOSIS — I120 Hypertensive chronic kidney disease with stage 5 chronic kidney disease or end stage renal disease: Secondary | ICD-10-CM | POA: Diagnosis not present

## 2015-07-30 DIAGNOSIS — E875 Hyperkalemia: Secondary | ICD-10-CM | POA: Diagnosis not present

## 2015-07-30 DIAGNOSIS — N186 End stage renal disease: Secondary | ICD-10-CM | POA: Diagnosis not present

## 2015-07-30 DIAGNOSIS — I4891 Unspecified atrial fibrillation: Secondary | ICD-10-CM | POA: Diagnosis not present

## 2015-07-30 DIAGNOSIS — N259 Disorder resulting from impaired renal tubular function, unspecified: Secondary | ICD-10-CM | POA: Diagnosis not present

## 2015-07-30 DIAGNOSIS — Z992 Dependence on renal dialysis: Secondary | ICD-10-CM | POA: Diagnosis not present

## 2015-07-30 DIAGNOSIS — R7989 Other specified abnormal findings of blood chemistry: Secondary | ICD-10-CM | POA: Diagnosis not present

## 2015-07-30 DIAGNOSIS — D509 Iron deficiency anemia, unspecified: Secondary | ICD-10-CM | POA: Diagnosis not present

## 2015-08-01 DIAGNOSIS — D509 Iron deficiency anemia, unspecified: Secondary | ICD-10-CM | POA: Diagnosis not present

## 2015-08-01 DIAGNOSIS — N186 End stage renal disease: Secondary | ICD-10-CM | POA: Diagnosis not present

## 2015-08-01 DIAGNOSIS — R7989 Other specified abnormal findings of blood chemistry: Secondary | ICD-10-CM | POA: Diagnosis not present

## 2015-08-01 DIAGNOSIS — N259 Disorder resulting from impaired renal tubular function, unspecified: Secondary | ICD-10-CM | POA: Diagnosis not present

## 2015-08-01 DIAGNOSIS — E875 Hyperkalemia: Secondary | ICD-10-CM | POA: Diagnosis not present

## 2015-08-03 DIAGNOSIS — Z7901 Long term (current) use of anticoagulants: Secondary | ICD-10-CM | POA: Diagnosis not present

## 2015-08-03 DIAGNOSIS — N186 End stage renal disease: Secondary | ICD-10-CM | POA: Diagnosis not present

## 2015-08-03 DIAGNOSIS — D699 Hemorrhagic condition, unspecified: Secondary | ICD-10-CM | POA: Diagnosis not present

## 2015-08-04 DIAGNOSIS — E875 Hyperkalemia: Secondary | ICD-10-CM | POA: Diagnosis not present

## 2015-08-04 DIAGNOSIS — N2589 Other disorders resulting from impaired renal tubular function: Secondary | ICD-10-CM | POA: Diagnosis not present

## 2015-08-04 DIAGNOSIS — N259 Disorder resulting from impaired renal tubular function, unspecified: Secondary | ICD-10-CM | POA: Diagnosis not present

## 2015-08-04 DIAGNOSIS — N186 End stage renal disease: Secondary | ICD-10-CM | POA: Diagnosis not present

## 2015-08-04 DIAGNOSIS — D509 Iron deficiency anemia, unspecified: Secondary | ICD-10-CM | POA: Diagnosis not present

## 2015-08-06 DIAGNOSIS — D509 Iron deficiency anemia, unspecified: Secondary | ICD-10-CM | POA: Diagnosis not present

## 2015-08-06 DIAGNOSIS — N2589 Other disorders resulting from impaired renal tubular function: Secondary | ICD-10-CM | POA: Diagnosis not present

## 2015-08-06 DIAGNOSIS — N259 Disorder resulting from impaired renal tubular function, unspecified: Secondary | ICD-10-CM | POA: Diagnosis not present

## 2015-08-06 DIAGNOSIS — E875 Hyperkalemia: Secondary | ICD-10-CM | POA: Diagnosis not present

## 2015-08-06 DIAGNOSIS — N186 End stage renal disease: Secondary | ICD-10-CM | POA: Diagnosis not present

## 2015-08-08 DIAGNOSIS — N259 Disorder resulting from impaired renal tubular function, unspecified: Secondary | ICD-10-CM | POA: Diagnosis not present

## 2015-08-08 DIAGNOSIS — N186 End stage renal disease: Secondary | ICD-10-CM | POA: Diagnosis not present

## 2015-08-08 DIAGNOSIS — E875 Hyperkalemia: Secondary | ICD-10-CM | POA: Diagnosis not present

## 2015-08-08 DIAGNOSIS — N2589 Other disorders resulting from impaired renal tubular function: Secondary | ICD-10-CM | POA: Diagnosis not present

## 2015-08-08 DIAGNOSIS — D509 Iron deficiency anemia, unspecified: Secondary | ICD-10-CM | POA: Diagnosis not present

## 2015-08-10 DIAGNOSIS — Z7901 Long term (current) use of anticoagulants: Secondary | ICD-10-CM | POA: Diagnosis not present

## 2015-08-11 DIAGNOSIS — E875 Hyperkalemia: Secondary | ICD-10-CM | POA: Diagnosis not present

## 2015-08-11 DIAGNOSIS — N259 Disorder resulting from impaired renal tubular function, unspecified: Secondary | ICD-10-CM | POA: Diagnosis not present

## 2015-08-11 DIAGNOSIS — D509 Iron deficiency anemia, unspecified: Secondary | ICD-10-CM | POA: Diagnosis not present

## 2015-08-11 DIAGNOSIS — N186 End stage renal disease: Secondary | ICD-10-CM | POA: Diagnosis not present

## 2015-08-11 DIAGNOSIS — N2589 Other disorders resulting from impaired renal tubular function: Secondary | ICD-10-CM | POA: Diagnosis not present

## 2015-08-12 DIAGNOSIS — R269 Unspecified abnormalities of gait and mobility: Secondary | ICD-10-CM | POA: Diagnosis not present

## 2015-08-12 DIAGNOSIS — G629 Polyneuropathy, unspecified: Secondary | ICD-10-CM | POA: Diagnosis not present

## 2015-08-13 DIAGNOSIS — D509 Iron deficiency anemia, unspecified: Secondary | ICD-10-CM | POA: Diagnosis not present

## 2015-08-13 DIAGNOSIS — N259 Disorder resulting from impaired renal tubular function, unspecified: Secondary | ICD-10-CM | POA: Diagnosis not present

## 2015-08-13 DIAGNOSIS — N2589 Other disorders resulting from impaired renal tubular function: Secondary | ICD-10-CM | POA: Diagnosis not present

## 2015-08-13 DIAGNOSIS — N186 End stage renal disease: Secondary | ICD-10-CM | POA: Diagnosis not present

## 2015-08-13 DIAGNOSIS — E875 Hyperkalemia: Secondary | ICD-10-CM | POA: Diagnosis not present

## 2015-08-15 DIAGNOSIS — E875 Hyperkalemia: Secondary | ICD-10-CM | POA: Diagnosis not present

## 2015-08-15 DIAGNOSIS — D509 Iron deficiency anemia, unspecified: Secondary | ICD-10-CM | POA: Diagnosis not present

## 2015-08-15 DIAGNOSIS — N259 Disorder resulting from impaired renal tubular function, unspecified: Secondary | ICD-10-CM | POA: Diagnosis not present

## 2015-08-15 DIAGNOSIS — N186 End stage renal disease: Secondary | ICD-10-CM | POA: Diagnosis not present

## 2015-08-15 DIAGNOSIS — N2589 Other disorders resulting from impaired renal tubular function: Secondary | ICD-10-CM | POA: Diagnosis not present

## 2015-08-17 DIAGNOSIS — F4321 Adjustment disorder with depressed mood: Secondary | ICD-10-CM | POA: Diagnosis not present

## 2015-08-18 DIAGNOSIS — D509 Iron deficiency anemia, unspecified: Secondary | ICD-10-CM | POA: Diagnosis not present

## 2015-08-18 DIAGNOSIS — N186 End stage renal disease: Secondary | ICD-10-CM | POA: Diagnosis not present

## 2015-08-18 DIAGNOSIS — N2589 Other disorders resulting from impaired renal tubular function: Secondary | ICD-10-CM | POA: Diagnosis not present

## 2015-08-18 DIAGNOSIS — E875 Hyperkalemia: Secondary | ICD-10-CM | POA: Diagnosis not present

## 2015-08-18 DIAGNOSIS — N259 Disorder resulting from impaired renal tubular function, unspecified: Secondary | ICD-10-CM | POA: Diagnosis not present

## 2015-08-19 DIAGNOSIS — Z7901 Long term (current) use of anticoagulants: Secondary | ICD-10-CM | POA: Diagnosis not present

## 2015-08-20 DIAGNOSIS — J159 Unspecified bacterial pneumonia: Secondary | ICD-10-CM | POA: Diagnosis not present

## 2015-08-20 DIAGNOSIS — N186 End stage renal disease: Secondary | ICD-10-CM | POA: Diagnosis not present

## 2015-08-20 DIAGNOSIS — I480 Paroxysmal atrial fibrillation: Secondary | ICD-10-CM | POA: Diagnosis not present

## 2015-08-20 DIAGNOSIS — N2589 Other disorders resulting from impaired renal tubular function: Secondary | ICD-10-CM | POA: Diagnosis not present

## 2015-08-20 DIAGNOSIS — E875 Hyperkalemia: Secondary | ICD-10-CM | POA: Diagnosis not present

## 2015-08-20 DIAGNOSIS — R55 Syncope and collapse: Secondary | ICD-10-CM | POA: Diagnosis not present

## 2015-08-20 DIAGNOSIS — N259 Disorder resulting from impaired renal tubular function, unspecified: Secondary | ICD-10-CM | POA: Diagnosis not present

## 2015-08-20 DIAGNOSIS — D509 Iron deficiency anemia, unspecified: Secondary | ICD-10-CM | POA: Diagnosis not present

## 2015-08-20 DIAGNOSIS — L03115 Cellulitis of right lower limb: Secondary | ICD-10-CM | POA: Diagnosis not present

## 2015-08-21 DIAGNOSIS — R55 Syncope and collapse: Secondary | ICD-10-CM | POA: Diagnosis not present

## 2015-08-21 DIAGNOSIS — I480 Paroxysmal atrial fibrillation: Secondary | ICD-10-CM | POA: Diagnosis not present

## 2015-08-21 DIAGNOSIS — L03115 Cellulitis of right lower limb: Secondary | ICD-10-CM | POA: Diagnosis not present

## 2015-08-21 DIAGNOSIS — J159 Unspecified bacterial pneumonia: Secondary | ICD-10-CM | POA: Diagnosis not present

## 2015-08-21 DIAGNOSIS — N186 End stage renal disease: Secondary | ICD-10-CM | POA: Diagnosis not present

## 2015-08-22 DIAGNOSIS — N186 End stage renal disease: Secondary | ICD-10-CM | POA: Diagnosis not present

## 2015-08-22 DIAGNOSIS — R002 Palpitations: Secondary | ICD-10-CM | POA: Diagnosis not present

## 2015-08-22 DIAGNOSIS — E875 Hyperkalemia: Secondary | ICD-10-CM | POA: Diagnosis not present

## 2015-08-22 DIAGNOSIS — N2589 Other disorders resulting from impaired renal tubular function: Secondary | ICD-10-CM | POA: Diagnosis not present

## 2015-08-22 DIAGNOSIS — D509 Iron deficiency anemia, unspecified: Secondary | ICD-10-CM | POA: Diagnosis not present

## 2015-08-22 DIAGNOSIS — I4891 Unspecified atrial fibrillation: Secondary | ICD-10-CM | POA: Diagnosis not present

## 2015-08-22 DIAGNOSIS — N259 Disorder resulting from impaired renal tubular function, unspecified: Secondary | ICD-10-CM | POA: Diagnosis not present

## 2015-08-24 DIAGNOSIS — I480 Paroxysmal atrial fibrillation: Secondary | ICD-10-CM | POA: Diagnosis not present

## 2015-08-24 DIAGNOSIS — L03115 Cellulitis of right lower limb: Secondary | ICD-10-CM | POA: Diagnosis not present

## 2015-08-24 DIAGNOSIS — N186 End stage renal disease: Secondary | ICD-10-CM | POA: Diagnosis not present

## 2015-08-24 DIAGNOSIS — Z7901 Long term (current) use of anticoagulants: Secondary | ICD-10-CM | POA: Diagnosis not present

## 2015-08-24 DIAGNOSIS — R55 Syncope and collapse: Secondary | ICD-10-CM | POA: Diagnosis not present

## 2015-08-24 DIAGNOSIS — J159 Unspecified bacterial pneumonia: Secondary | ICD-10-CM | POA: Diagnosis not present

## 2015-08-25 DIAGNOSIS — D509 Iron deficiency anemia, unspecified: Secondary | ICD-10-CM | POA: Diagnosis not present

## 2015-08-25 DIAGNOSIS — R55 Syncope and collapse: Secondary | ICD-10-CM | POA: Diagnosis not present

## 2015-08-25 DIAGNOSIS — L03115 Cellulitis of right lower limb: Secondary | ICD-10-CM | POA: Diagnosis not present

## 2015-08-25 DIAGNOSIS — J159 Unspecified bacterial pneumonia: Secondary | ICD-10-CM | POA: Diagnosis not present

## 2015-08-25 DIAGNOSIS — N259 Disorder resulting from impaired renal tubular function, unspecified: Secondary | ICD-10-CM | POA: Diagnosis not present

## 2015-08-25 DIAGNOSIS — N2589 Other disorders resulting from impaired renal tubular function: Secondary | ICD-10-CM | POA: Diagnosis not present

## 2015-08-25 DIAGNOSIS — N186 End stage renal disease: Secondary | ICD-10-CM | POA: Diagnosis not present

## 2015-08-25 DIAGNOSIS — E875 Hyperkalemia: Secondary | ICD-10-CM | POA: Diagnosis not present

## 2015-08-25 DIAGNOSIS — I480 Paroxysmal atrial fibrillation: Secondary | ICD-10-CM | POA: Diagnosis not present

## 2015-08-26 DIAGNOSIS — I480 Paroxysmal atrial fibrillation: Secondary | ICD-10-CM | POA: Diagnosis not present

## 2015-08-26 DIAGNOSIS — J159 Unspecified bacterial pneumonia: Secondary | ICD-10-CM | POA: Diagnosis not present

## 2015-08-26 DIAGNOSIS — L03115 Cellulitis of right lower limb: Secondary | ICD-10-CM | POA: Diagnosis not present

## 2015-08-26 DIAGNOSIS — N186 End stage renal disease: Secondary | ICD-10-CM | POA: Diagnosis not present

## 2015-08-26 DIAGNOSIS — R55 Syncope and collapse: Secondary | ICD-10-CM | POA: Diagnosis not present

## 2015-08-27 DIAGNOSIS — N259 Disorder resulting from impaired renal tubular function, unspecified: Secondary | ICD-10-CM | POA: Diagnosis not present

## 2015-08-27 DIAGNOSIS — R55 Syncope and collapse: Secondary | ICD-10-CM | POA: Diagnosis not present

## 2015-08-27 DIAGNOSIS — L03115 Cellulitis of right lower limb: Secondary | ICD-10-CM | POA: Diagnosis not present

## 2015-08-27 DIAGNOSIS — E875 Hyperkalemia: Secondary | ICD-10-CM | POA: Diagnosis not present

## 2015-08-27 DIAGNOSIS — D509 Iron deficiency anemia, unspecified: Secondary | ICD-10-CM | POA: Diagnosis not present

## 2015-08-27 DIAGNOSIS — N186 End stage renal disease: Secondary | ICD-10-CM | POA: Diagnosis not present

## 2015-08-27 DIAGNOSIS — N2589 Other disorders resulting from impaired renal tubular function: Secondary | ICD-10-CM | POA: Diagnosis not present

## 2015-08-27 DIAGNOSIS — I480 Paroxysmal atrial fibrillation: Secondary | ICD-10-CM | POA: Diagnosis not present

## 2015-08-27 DIAGNOSIS — J159 Unspecified bacterial pneumonia: Secondary | ICD-10-CM | POA: Diagnosis not present

## 2015-08-28 DIAGNOSIS — D649 Anemia, unspecified: Secondary | ICD-10-CM | POA: Diagnosis not present

## 2015-08-28 DIAGNOSIS — I120 Hypertensive chronic kidney disease with stage 5 chronic kidney disease or end stage renal disease: Secondary | ICD-10-CM | POA: Diagnosis not present

## 2015-08-28 DIAGNOSIS — I480 Paroxysmal atrial fibrillation: Secondary | ICD-10-CM | POA: Diagnosis not present

## 2015-08-28 DIAGNOSIS — J159 Unspecified bacterial pneumonia: Secondary | ICD-10-CM | POA: Diagnosis not present

## 2015-08-28 DIAGNOSIS — L03115 Cellulitis of right lower limb: Secondary | ICD-10-CM | POA: Diagnosis not present

## 2015-08-28 DIAGNOSIS — N186 End stage renal disease: Secondary | ICD-10-CM | POA: Diagnosis not present

## 2015-08-28 DIAGNOSIS — Z992 Dependence on renal dialysis: Secondary | ICD-10-CM | POA: Diagnosis not present

## 2015-08-28 DIAGNOSIS — R55 Syncope and collapse: Secondary | ICD-10-CM | POA: Diagnosis not present

## 2015-08-29 DIAGNOSIS — N259 Disorder resulting from impaired renal tubular function, unspecified: Secondary | ICD-10-CM | POA: Diagnosis not present

## 2015-08-29 DIAGNOSIS — D509 Iron deficiency anemia, unspecified: Secondary | ICD-10-CM | POA: Diagnosis not present

## 2015-08-29 DIAGNOSIS — N186 End stage renal disease: Secondary | ICD-10-CM | POA: Diagnosis not present

## 2015-08-29 DIAGNOSIS — E875 Hyperkalemia: Secondary | ICD-10-CM | POA: Diagnosis not present

## 2015-08-29 DIAGNOSIS — N2589 Other disorders resulting from impaired renal tubular function: Secondary | ICD-10-CM | POA: Diagnosis not present

## 2015-08-31 DIAGNOSIS — J159 Unspecified bacterial pneumonia: Secondary | ICD-10-CM | POA: Diagnosis not present

## 2015-08-31 DIAGNOSIS — R55 Syncope and collapse: Secondary | ICD-10-CM | POA: Diagnosis not present

## 2015-08-31 DIAGNOSIS — N186 End stage renal disease: Secondary | ICD-10-CM | POA: Diagnosis not present

## 2015-08-31 DIAGNOSIS — L03115 Cellulitis of right lower limb: Secondary | ICD-10-CM | POA: Diagnosis not present

## 2015-08-31 DIAGNOSIS — I480 Paroxysmal atrial fibrillation: Secondary | ICD-10-CM | POA: Diagnosis not present

## 2015-09-01 DIAGNOSIS — D509 Iron deficiency anemia, unspecified: Secondary | ICD-10-CM | POA: Diagnosis not present

## 2015-09-01 DIAGNOSIS — N186 End stage renal disease: Secondary | ICD-10-CM | POA: Diagnosis not present

## 2015-09-01 DIAGNOSIS — N2589 Other disorders resulting from impaired renal tubular function: Secondary | ICD-10-CM | POA: Diagnosis not present

## 2015-09-01 DIAGNOSIS — J159 Unspecified bacterial pneumonia: Secondary | ICD-10-CM | POA: Diagnosis not present

## 2015-09-01 DIAGNOSIS — I480 Paroxysmal atrial fibrillation: Secondary | ICD-10-CM | POA: Diagnosis not present

## 2015-09-01 DIAGNOSIS — R55 Syncope and collapse: Secondary | ICD-10-CM | POA: Diagnosis not present

## 2015-09-01 DIAGNOSIS — N259 Disorder resulting from impaired renal tubular function, unspecified: Secondary | ICD-10-CM | POA: Diagnosis not present

## 2015-09-01 DIAGNOSIS — E875 Hyperkalemia: Secondary | ICD-10-CM | POA: Diagnosis not present

## 2015-09-01 DIAGNOSIS — Z7901 Long term (current) use of anticoagulants: Secondary | ICD-10-CM | POA: Diagnosis not present

## 2015-09-01 DIAGNOSIS — L03115 Cellulitis of right lower limb: Secondary | ICD-10-CM | POA: Diagnosis not present

## 2015-09-02 DIAGNOSIS — J159 Unspecified bacterial pneumonia: Secondary | ICD-10-CM | POA: Diagnosis not present

## 2015-09-02 DIAGNOSIS — N186 End stage renal disease: Secondary | ICD-10-CM | POA: Diagnosis not present

## 2015-09-02 DIAGNOSIS — L03115 Cellulitis of right lower limb: Secondary | ICD-10-CM | POA: Diagnosis not present

## 2015-09-02 DIAGNOSIS — I480 Paroxysmal atrial fibrillation: Secondary | ICD-10-CM | POA: Diagnosis not present

## 2015-09-02 DIAGNOSIS — R55 Syncope and collapse: Secondary | ICD-10-CM | POA: Diagnosis not present

## 2015-09-03 DIAGNOSIS — I480 Paroxysmal atrial fibrillation: Secondary | ICD-10-CM | POA: Diagnosis not present

## 2015-09-03 DIAGNOSIS — E875 Hyperkalemia: Secondary | ICD-10-CM | POA: Diagnosis not present

## 2015-09-03 DIAGNOSIS — N259 Disorder resulting from impaired renal tubular function, unspecified: Secondary | ICD-10-CM | POA: Diagnosis not present

## 2015-09-03 DIAGNOSIS — J159 Unspecified bacterial pneumonia: Secondary | ICD-10-CM | POA: Diagnosis not present

## 2015-09-03 DIAGNOSIS — R7989 Other specified abnormal findings of blood chemistry: Secondary | ICD-10-CM | POA: Diagnosis not present

## 2015-09-03 DIAGNOSIS — N186 End stage renal disease: Secondary | ICD-10-CM | POA: Diagnosis not present

## 2015-09-03 DIAGNOSIS — N2589 Other disorders resulting from impaired renal tubular function: Secondary | ICD-10-CM | POA: Diagnosis not present

## 2015-09-03 DIAGNOSIS — D631 Anemia in chronic kidney disease: Secondary | ICD-10-CM | POA: Diagnosis not present

## 2015-09-03 DIAGNOSIS — D509 Iron deficiency anemia, unspecified: Secondary | ICD-10-CM | POA: Diagnosis not present

## 2015-09-03 DIAGNOSIS — N2581 Secondary hyperparathyroidism of renal origin: Secondary | ICD-10-CM | POA: Diagnosis not present

## 2015-09-03 DIAGNOSIS — L03115 Cellulitis of right lower limb: Secondary | ICD-10-CM | POA: Diagnosis not present

## 2015-09-03 DIAGNOSIS — R55 Syncope and collapse: Secondary | ICD-10-CM | POA: Diagnosis not present

## 2015-09-04 DIAGNOSIS — I480 Paroxysmal atrial fibrillation: Secondary | ICD-10-CM | POA: Diagnosis not present

## 2015-09-04 DIAGNOSIS — L03115 Cellulitis of right lower limb: Secondary | ICD-10-CM | POA: Diagnosis not present

## 2015-09-04 DIAGNOSIS — N186 End stage renal disease: Secondary | ICD-10-CM | POA: Diagnosis not present

## 2015-09-04 DIAGNOSIS — R55 Syncope and collapse: Secondary | ICD-10-CM | POA: Diagnosis not present

## 2015-09-04 DIAGNOSIS — J159 Unspecified bacterial pneumonia: Secondary | ICD-10-CM | POA: Diagnosis not present

## 2015-09-05 DIAGNOSIS — N259 Disorder resulting from impaired renal tubular function, unspecified: Secondary | ICD-10-CM | POA: Diagnosis not present

## 2015-09-05 DIAGNOSIS — N2589 Other disorders resulting from impaired renal tubular function: Secondary | ICD-10-CM | POA: Diagnosis not present

## 2015-09-05 DIAGNOSIS — D509 Iron deficiency anemia, unspecified: Secondary | ICD-10-CM | POA: Diagnosis not present

## 2015-09-05 DIAGNOSIS — R7989 Other specified abnormal findings of blood chemistry: Secondary | ICD-10-CM | POA: Diagnosis not present

## 2015-09-05 DIAGNOSIS — D631 Anemia in chronic kidney disease: Secondary | ICD-10-CM | POA: Diagnosis not present

## 2015-09-05 DIAGNOSIS — N186 End stage renal disease: Secondary | ICD-10-CM | POA: Diagnosis not present

## 2015-09-07 DIAGNOSIS — Z7901 Long term (current) use of anticoagulants: Secondary | ICD-10-CM | POA: Diagnosis not present

## 2015-09-07 DIAGNOSIS — N186 End stage renal disease: Secondary | ICD-10-CM | POA: Diagnosis not present

## 2015-09-07 DIAGNOSIS — I480 Paroxysmal atrial fibrillation: Secondary | ICD-10-CM | POA: Diagnosis not present

## 2015-09-07 DIAGNOSIS — J159 Unspecified bacterial pneumonia: Secondary | ICD-10-CM | POA: Diagnosis not present

## 2015-09-07 DIAGNOSIS — R55 Syncope and collapse: Secondary | ICD-10-CM | POA: Diagnosis not present

## 2015-09-07 DIAGNOSIS — L03115 Cellulitis of right lower limb: Secondary | ICD-10-CM | POA: Diagnosis not present

## 2015-09-08 DIAGNOSIS — N2589 Other disorders resulting from impaired renal tubular function: Secondary | ICD-10-CM | POA: Diagnosis not present

## 2015-09-08 DIAGNOSIS — L03115 Cellulitis of right lower limb: Secondary | ICD-10-CM | POA: Diagnosis not present

## 2015-09-08 DIAGNOSIS — I480 Paroxysmal atrial fibrillation: Secondary | ICD-10-CM | POA: Diagnosis not present

## 2015-09-08 DIAGNOSIS — M62838 Other muscle spasm: Secondary | ICD-10-CM | POA: Diagnosis not present

## 2015-09-08 DIAGNOSIS — J159 Unspecified bacterial pneumonia: Secondary | ICD-10-CM | POA: Diagnosis not present

## 2015-09-08 DIAGNOSIS — D631 Anemia in chronic kidney disease: Secondary | ICD-10-CM | POA: Diagnosis not present

## 2015-09-08 DIAGNOSIS — R55 Syncope and collapse: Secondary | ICD-10-CM | POA: Diagnosis not present

## 2015-09-08 DIAGNOSIS — N186 End stage renal disease: Secondary | ICD-10-CM | POA: Diagnosis not present

## 2015-09-08 DIAGNOSIS — M79605 Pain in left leg: Secondary | ICD-10-CM | POA: Diagnosis not present

## 2015-09-08 DIAGNOSIS — N259 Disorder resulting from impaired renal tubular function, unspecified: Secondary | ICD-10-CM | POA: Diagnosis not present

## 2015-09-08 DIAGNOSIS — M79604 Pain in right leg: Secondary | ICD-10-CM | POA: Diagnosis not present

## 2015-09-08 DIAGNOSIS — D509 Iron deficiency anemia, unspecified: Secondary | ICD-10-CM | POA: Diagnosis not present

## 2015-09-08 DIAGNOSIS — R7989 Other specified abnormal findings of blood chemistry: Secondary | ICD-10-CM | POA: Diagnosis not present

## 2015-09-09 DIAGNOSIS — R55 Syncope and collapse: Secondary | ICD-10-CM | POA: Diagnosis not present

## 2015-09-09 DIAGNOSIS — L03115 Cellulitis of right lower limb: Secondary | ICD-10-CM | POA: Diagnosis not present

## 2015-09-09 DIAGNOSIS — N186 End stage renal disease: Secondary | ICD-10-CM | POA: Diagnosis not present

## 2015-09-09 DIAGNOSIS — I480 Paroxysmal atrial fibrillation: Secondary | ICD-10-CM | POA: Diagnosis not present

## 2015-09-09 DIAGNOSIS — J159 Unspecified bacterial pneumonia: Secondary | ICD-10-CM | POA: Diagnosis not present

## 2015-09-10 DIAGNOSIS — R55 Syncope and collapse: Secondary | ICD-10-CM | POA: Diagnosis not present

## 2015-09-10 DIAGNOSIS — L03115 Cellulitis of right lower limb: Secondary | ICD-10-CM | POA: Diagnosis not present

## 2015-09-10 DIAGNOSIS — D631 Anemia in chronic kidney disease: Secondary | ICD-10-CM | POA: Diagnosis not present

## 2015-09-10 DIAGNOSIS — N2589 Other disorders resulting from impaired renal tubular function: Secondary | ICD-10-CM | POA: Diagnosis not present

## 2015-09-10 DIAGNOSIS — I480 Paroxysmal atrial fibrillation: Secondary | ICD-10-CM | POA: Diagnosis not present

## 2015-09-10 DIAGNOSIS — N186 End stage renal disease: Secondary | ICD-10-CM | POA: Diagnosis not present

## 2015-09-10 DIAGNOSIS — J159 Unspecified bacterial pneumonia: Secondary | ICD-10-CM | POA: Diagnosis not present

## 2015-09-10 DIAGNOSIS — N259 Disorder resulting from impaired renal tubular function, unspecified: Secondary | ICD-10-CM | POA: Diagnosis not present

## 2015-09-10 DIAGNOSIS — R7989 Other specified abnormal findings of blood chemistry: Secondary | ICD-10-CM | POA: Diagnosis not present

## 2015-09-10 DIAGNOSIS — D509 Iron deficiency anemia, unspecified: Secondary | ICD-10-CM | POA: Diagnosis not present

## 2015-09-11 DIAGNOSIS — N186 End stage renal disease: Secondary | ICD-10-CM | POA: Diagnosis not present

## 2015-09-11 DIAGNOSIS — R55 Syncope and collapse: Secondary | ICD-10-CM | POA: Diagnosis not present

## 2015-09-11 DIAGNOSIS — J159 Unspecified bacterial pneumonia: Secondary | ICD-10-CM | POA: Diagnosis not present

## 2015-09-11 DIAGNOSIS — L03115 Cellulitis of right lower limb: Secondary | ICD-10-CM | POA: Diagnosis not present

## 2015-09-11 DIAGNOSIS — I480 Paroxysmal atrial fibrillation: Secondary | ICD-10-CM | POA: Diagnosis not present

## 2015-09-12 DIAGNOSIS — N259 Disorder resulting from impaired renal tubular function, unspecified: Secondary | ICD-10-CM | POA: Diagnosis not present

## 2015-09-12 DIAGNOSIS — R7989 Other specified abnormal findings of blood chemistry: Secondary | ICD-10-CM | POA: Diagnosis not present

## 2015-09-12 DIAGNOSIS — N186 End stage renal disease: Secondary | ICD-10-CM | POA: Diagnosis not present

## 2015-09-12 DIAGNOSIS — D631 Anemia in chronic kidney disease: Secondary | ICD-10-CM | POA: Diagnosis not present

## 2015-09-12 DIAGNOSIS — D509 Iron deficiency anemia, unspecified: Secondary | ICD-10-CM | POA: Diagnosis not present

## 2015-09-12 DIAGNOSIS — N2589 Other disorders resulting from impaired renal tubular function: Secondary | ICD-10-CM | POA: Diagnosis not present

## 2015-09-14 DIAGNOSIS — Z7901 Long term (current) use of anticoagulants: Secondary | ICD-10-CM | POA: Diagnosis not present

## 2015-09-14 DIAGNOSIS — F4321 Adjustment disorder with depressed mood: Secondary | ICD-10-CM | POA: Diagnosis not present

## 2015-09-15 DIAGNOSIS — N259 Disorder resulting from impaired renal tubular function, unspecified: Secondary | ICD-10-CM | POA: Diagnosis not present

## 2015-09-15 DIAGNOSIS — N186 End stage renal disease: Secondary | ICD-10-CM | POA: Diagnosis not present

## 2015-09-15 DIAGNOSIS — N2589 Other disorders resulting from impaired renal tubular function: Secondary | ICD-10-CM | POA: Diagnosis not present

## 2015-09-15 DIAGNOSIS — D509 Iron deficiency anemia, unspecified: Secondary | ICD-10-CM | POA: Diagnosis not present

## 2015-09-15 DIAGNOSIS — D631 Anemia in chronic kidney disease: Secondary | ICD-10-CM | POA: Diagnosis not present

## 2015-09-15 DIAGNOSIS — R7989 Other specified abnormal findings of blood chemistry: Secondary | ICD-10-CM | POA: Diagnosis not present

## 2015-09-16 DIAGNOSIS — I4891 Unspecified atrial fibrillation: Secondary | ICD-10-CM | POA: Diagnosis not present

## 2015-09-16 DIAGNOSIS — D689 Coagulation defect, unspecified: Secondary | ICD-10-CM | POA: Diagnosis not present

## 2015-09-17 DIAGNOSIS — N186 End stage renal disease: Secondary | ICD-10-CM | POA: Diagnosis not present

## 2015-09-17 DIAGNOSIS — D631 Anemia in chronic kidney disease: Secondary | ICD-10-CM | POA: Diagnosis not present

## 2015-09-17 DIAGNOSIS — N259 Disorder resulting from impaired renal tubular function, unspecified: Secondary | ICD-10-CM | POA: Diagnosis not present

## 2015-09-17 DIAGNOSIS — N2589 Other disorders resulting from impaired renal tubular function: Secondary | ICD-10-CM | POA: Diagnosis not present

## 2015-09-17 DIAGNOSIS — D509 Iron deficiency anemia, unspecified: Secondary | ICD-10-CM | POA: Diagnosis not present

## 2015-09-17 DIAGNOSIS — R7989 Other specified abnormal findings of blood chemistry: Secondary | ICD-10-CM | POA: Diagnosis not present

## 2015-09-19 DIAGNOSIS — N186 End stage renal disease: Secondary | ICD-10-CM | POA: Diagnosis not present

## 2015-09-19 DIAGNOSIS — N259 Disorder resulting from impaired renal tubular function, unspecified: Secondary | ICD-10-CM | POA: Diagnosis not present

## 2015-09-19 DIAGNOSIS — R7989 Other specified abnormal findings of blood chemistry: Secondary | ICD-10-CM | POA: Diagnosis not present

## 2015-09-19 DIAGNOSIS — N2589 Other disorders resulting from impaired renal tubular function: Secondary | ICD-10-CM | POA: Diagnosis not present

## 2015-09-19 DIAGNOSIS — D509 Iron deficiency anemia, unspecified: Secondary | ICD-10-CM | POA: Diagnosis not present

## 2015-09-19 DIAGNOSIS — D631 Anemia in chronic kidney disease: Secondary | ICD-10-CM | POA: Diagnosis not present

## 2015-09-21 DIAGNOSIS — R7989 Other specified abnormal findings of blood chemistry: Secondary | ICD-10-CM | POA: Diagnosis not present

## 2015-09-21 DIAGNOSIS — Z7901 Long term (current) use of anticoagulants: Secondary | ICD-10-CM | POA: Diagnosis not present

## 2015-09-21 DIAGNOSIS — N186 End stage renal disease: Secondary | ICD-10-CM | POA: Diagnosis not present

## 2015-09-21 DIAGNOSIS — N2589 Other disorders resulting from impaired renal tubular function: Secondary | ICD-10-CM | POA: Diagnosis not present

## 2015-09-21 DIAGNOSIS — D509 Iron deficiency anemia, unspecified: Secondary | ICD-10-CM | POA: Diagnosis not present

## 2015-09-21 DIAGNOSIS — N259 Disorder resulting from impaired renal tubular function, unspecified: Secondary | ICD-10-CM | POA: Diagnosis not present

## 2015-09-21 DIAGNOSIS — D631 Anemia in chronic kidney disease: Secondary | ICD-10-CM | POA: Diagnosis not present

## 2015-09-23 DIAGNOSIS — R7989 Other specified abnormal findings of blood chemistry: Secondary | ICD-10-CM | POA: Diagnosis not present

## 2015-09-23 DIAGNOSIS — N2589 Other disorders resulting from impaired renal tubular function: Secondary | ICD-10-CM | POA: Diagnosis not present

## 2015-09-23 DIAGNOSIS — D649 Anemia, unspecified: Secondary | ICD-10-CM | POA: Diagnosis not present

## 2015-09-23 DIAGNOSIS — N259 Disorder resulting from impaired renal tubular function, unspecified: Secondary | ICD-10-CM | POA: Diagnosis not present

## 2015-09-23 DIAGNOSIS — N186 End stage renal disease: Secondary | ICD-10-CM | POA: Diagnosis not present

## 2015-09-23 DIAGNOSIS — D631 Anemia in chronic kidney disease: Secondary | ICD-10-CM | POA: Diagnosis not present

## 2015-09-23 DIAGNOSIS — I10 Essential (primary) hypertension: Secondary | ICD-10-CM | POA: Diagnosis not present

## 2015-09-23 DIAGNOSIS — I120 Hypertensive chronic kidney disease with stage 5 chronic kidney disease or end stage renal disease: Secondary | ICD-10-CM | POA: Diagnosis not present

## 2015-09-23 DIAGNOSIS — D509 Iron deficiency anemia, unspecified: Secondary | ICD-10-CM | POA: Diagnosis not present

## 2015-09-26 DIAGNOSIS — Z992 Dependence on renal dialysis: Secondary | ICD-10-CM | POA: Diagnosis not present

## 2015-09-26 DIAGNOSIS — N186 End stage renal disease: Secondary | ICD-10-CM | POA: Diagnosis not present

## 2015-09-26 DIAGNOSIS — N2581 Secondary hyperparathyroidism of renal origin: Secondary | ICD-10-CM | POA: Diagnosis not present

## 2015-09-26 DIAGNOSIS — D631 Anemia in chronic kidney disease: Secondary | ICD-10-CM | POA: Diagnosis not present

## 2015-09-26 DIAGNOSIS — I129 Hypertensive chronic kidney disease with stage 1 through stage 4 chronic kidney disease, or unspecified chronic kidney disease: Secondary | ICD-10-CM | POA: Diagnosis not present

## 2015-09-29 DIAGNOSIS — N186 End stage renal disease: Secondary | ICD-10-CM | POA: Diagnosis not present

## 2015-09-29 DIAGNOSIS — N2581 Secondary hyperparathyroidism of renal origin: Secondary | ICD-10-CM | POA: Diagnosis not present

## 2015-09-29 DIAGNOSIS — Z992 Dependence on renal dialysis: Secondary | ICD-10-CM | POA: Diagnosis not present

## 2015-09-29 DIAGNOSIS — D631 Anemia in chronic kidney disease: Secondary | ICD-10-CM | POA: Diagnosis not present

## 2015-10-01 DIAGNOSIS — N2581 Secondary hyperparathyroidism of renal origin: Secondary | ICD-10-CM | POA: Diagnosis not present

## 2015-10-01 DIAGNOSIS — N186 End stage renal disease: Secondary | ICD-10-CM | POA: Diagnosis not present

## 2015-10-01 DIAGNOSIS — Z992 Dependence on renal dialysis: Secondary | ICD-10-CM | POA: Diagnosis not present

## 2015-10-01 DIAGNOSIS — D631 Anemia in chronic kidney disease: Secondary | ICD-10-CM | POA: Diagnosis not present

## 2015-10-03 DIAGNOSIS — N186 End stage renal disease: Secondary | ICD-10-CM | POA: Diagnosis not present

## 2015-10-03 DIAGNOSIS — N2581 Secondary hyperparathyroidism of renal origin: Secondary | ICD-10-CM | POA: Diagnosis not present

## 2015-10-03 DIAGNOSIS — Z992 Dependence on renal dialysis: Secondary | ICD-10-CM | POA: Diagnosis not present

## 2015-10-06 DIAGNOSIS — Z992 Dependence on renal dialysis: Secondary | ICD-10-CM | POA: Diagnosis not present

## 2015-10-06 DIAGNOSIS — N2581 Secondary hyperparathyroidism of renal origin: Secondary | ICD-10-CM | POA: Diagnosis not present

## 2015-10-06 DIAGNOSIS — N186 End stage renal disease: Secondary | ICD-10-CM | POA: Diagnosis not present

## 2015-10-08 DIAGNOSIS — I871 Compression of vein: Secondary | ICD-10-CM | POA: Diagnosis not present

## 2015-10-08 DIAGNOSIS — T82858A Stenosis of vascular prosthetic devices, implants and grafts, initial encounter: Secondary | ICD-10-CM | POA: Diagnosis not present

## 2015-10-08 DIAGNOSIS — N186 End stage renal disease: Secondary | ICD-10-CM | POA: Diagnosis not present

## 2015-10-08 DIAGNOSIS — Z992 Dependence on renal dialysis: Secondary | ICD-10-CM | POA: Diagnosis not present

## 2015-10-10 DIAGNOSIS — N2581 Secondary hyperparathyroidism of renal origin: Secondary | ICD-10-CM | POA: Diagnosis not present

## 2015-10-10 DIAGNOSIS — N186 End stage renal disease: Secondary | ICD-10-CM | POA: Diagnosis not present

## 2015-10-10 DIAGNOSIS — Z992 Dependence on renal dialysis: Secondary | ICD-10-CM | POA: Diagnosis not present

## 2015-10-13 DIAGNOSIS — Z992 Dependence on renal dialysis: Secondary | ICD-10-CM | POA: Diagnosis not present

## 2015-10-13 DIAGNOSIS — N2581 Secondary hyperparathyroidism of renal origin: Secondary | ICD-10-CM | POA: Diagnosis not present

## 2015-10-13 DIAGNOSIS — N186 End stage renal disease: Secondary | ICD-10-CM | POA: Diagnosis not present

## 2015-10-15 DIAGNOSIS — N186 End stage renal disease: Secondary | ICD-10-CM | POA: Diagnosis not present

## 2015-10-15 DIAGNOSIS — Z992 Dependence on renal dialysis: Secondary | ICD-10-CM | POA: Diagnosis not present

## 2015-10-15 DIAGNOSIS — N2581 Secondary hyperparathyroidism of renal origin: Secondary | ICD-10-CM | POA: Diagnosis not present

## 2015-10-17 DIAGNOSIS — Z992 Dependence on renal dialysis: Secondary | ICD-10-CM | POA: Diagnosis not present

## 2015-10-17 DIAGNOSIS — N2581 Secondary hyperparathyroidism of renal origin: Secondary | ICD-10-CM | POA: Diagnosis not present

## 2015-10-17 DIAGNOSIS — N186 End stage renal disease: Secondary | ICD-10-CM | POA: Diagnosis not present

## 2015-10-20 DIAGNOSIS — Z992 Dependence on renal dialysis: Secondary | ICD-10-CM | POA: Diagnosis not present

## 2015-10-20 DIAGNOSIS — N186 End stage renal disease: Secondary | ICD-10-CM | POA: Diagnosis not present

## 2015-10-20 DIAGNOSIS — N2581 Secondary hyperparathyroidism of renal origin: Secondary | ICD-10-CM | POA: Diagnosis not present

## 2015-10-22 DIAGNOSIS — N186 End stage renal disease: Secondary | ICD-10-CM | POA: Diagnosis not present

## 2015-10-22 DIAGNOSIS — N2581 Secondary hyperparathyroidism of renal origin: Secondary | ICD-10-CM | POA: Diagnosis not present

## 2015-10-22 DIAGNOSIS — Z992 Dependence on renal dialysis: Secondary | ICD-10-CM | POA: Diagnosis not present

## 2015-10-24 DIAGNOSIS — Z992 Dependence on renal dialysis: Secondary | ICD-10-CM | POA: Diagnosis not present

## 2015-10-24 DIAGNOSIS — N186 End stage renal disease: Secondary | ICD-10-CM | POA: Diagnosis not present

## 2015-10-24 DIAGNOSIS — N2581 Secondary hyperparathyroidism of renal origin: Secondary | ICD-10-CM | POA: Diagnosis not present

## 2015-10-27 ENCOUNTER — Other Ambulatory Visit: Payer: Self-pay | Admitting: Surgery

## 2015-10-27 DIAGNOSIS — N186 End stage renal disease: Secondary | ICD-10-CM | POA: Diagnosis not present

## 2015-10-27 DIAGNOSIS — Z01810 Encounter for preprocedural cardiovascular examination: Secondary | ICD-10-CM

## 2015-10-27 DIAGNOSIS — Z992 Dependence on renal dialysis: Secondary | ICD-10-CM | POA: Diagnosis not present

## 2015-10-27 DIAGNOSIS — N2581 Secondary hyperparathyroidism of renal origin: Secondary | ICD-10-CM | POA: Diagnosis not present

## 2015-10-29 DIAGNOSIS — Z992 Dependence on renal dialysis: Secondary | ICD-10-CM | POA: Diagnosis not present

## 2015-10-29 DIAGNOSIS — N2581 Secondary hyperparathyroidism of renal origin: Secondary | ICD-10-CM | POA: Diagnosis not present

## 2015-10-29 DIAGNOSIS — N186 End stage renal disease: Secondary | ICD-10-CM | POA: Diagnosis not present

## 2015-10-31 DIAGNOSIS — N186 End stage renal disease: Secondary | ICD-10-CM | POA: Diagnosis not present

## 2015-10-31 DIAGNOSIS — N2581 Secondary hyperparathyroidism of renal origin: Secondary | ICD-10-CM | POA: Diagnosis not present

## 2015-10-31 DIAGNOSIS — Z992 Dependence on renal dialysis: Secondary | ICD-10-CM | POA: Diagnosis not present

## 2015-11-02 DIAGNOSIS — I129 Hypertensive chronic kidney disease with stage 1 through stage 4 chronic kidney disease, or unspecified chronic kidney disease: Secondary | ICD-10-CM | POA: Diagnosis not present

## 2015-11-02 DIAGNOSIS — Z992 Dependence on renal dialysis: Secondary | ICD-10-CM | POA: Diagnosis not present

## 2015-11-02 DIAGNOSIS — N186 End stage renal disease: Secondary | ICD-10-CM | POA: Diagnosis not present

## 2015-11-03 DIAGNOSIS — Z992 Dependence on renal dialysis: Secondary | ICD-10-CM | POA: Diagnosis not present

## 2015-11-03 DIAGNOSIS — N2581 Secondary hyperparathyroidism of renal origin: Secondary | ICD-10-CM | POA: Diagnosis not present

## 2015-11-03 DIAGNOSIS — D509 Iron deficiency anemia, unspecified: Secondary | ICD-10-CM | POA: Diagnosis not present

## 2015-11-03 DIAGNOSIS — N186 End stage renal disease: Secondary | ICD-10-CM | POA: Diagnosis not present

## 2015-11-03 DIAGNOSIS — D631 Anemia in chronic kidney disease: Secondary | ICD-10-CM | POA: Diagnosis not present

## 2015-11-07 DIAGNOSIS — N2581 Secondary hyperparathyroidism of renal origin: Secondary | ICD-10-CM | POA: Diagnosis not present

## 2015-11-07 DIAGNOSIS — D631 Anemia in chronic kidney disease: Secondary | ICD-10-CM | POA: Diagnosis not present

## 2015-11-07 DIAGNOSIS — N186 End stage renal disease: Secondary | ICD-10-CM | POA: Diagnosis not present

## 2015-11-07 DIAGNOSIS — D509 Iron deficiency anemia, unspecified: Secondary | ICD-10-CM | POA: Diagnosis not present

## 2015-11-07 DIAGNOSIS — Z992 Dependence on renal dialysis: Secondary | ICD-10-CM | POA: Diagnosis not present

## 2015-11-10 DIAGNOSIS — N2581 Secondary hyperparathyroidism of renal origin: Secondary | ICD-10-CM | POA: Diagnosis not present

## 2015-11-10 DIAGNOSIS — Z992 Dependence on renal dialysis: Secondary | ICD-10-CM | POA: Diagnosis not present

## 2015-11-10 DIAGNOSIS — N186 End stage renal disease: Secondary | ICD-10-CM | POA: Diagnosis not present

## 2015-11-10 DIAGNOSIS — D509 Iron deficiency anemia, unspecified: Secondary | ICD-10-CM | POA: Diagnosis not present

## 2015-11-10 DIAGNOSIS — D631 Anemia in chronic kidney disease: Secondary | ICD-10-CM | POA: Diagnosis not present

## 2015-11-12 DIAGNOSIS — Z992 Dependence on renal dialysis: Secondary | ICD-10-CM | POA: Diagnosis not present

## 2015-11-12 DIAGNOSIS — N186 End stage renal disease: Secondary | ICD-10-CM | POA: Diagnosis not present

## 2015-11-12 DIAGNOSIS — D631 Anemia in chronic kidney disease: Secondary | ICD-10-CM | POA: Diagnosis not present

## 2015-11-12 DIAGNOSIS — N2581 Secondary hyperparathyroidism of renal origin: Secondary | ICD-10-CM | POA: Diagnosis not present

## 2015-11-12 DIAGNOSIS — D509 Iron deficiency anemia, unspecified: Secondary | ICD-10-CM | POA: Diagnosis not present

## 2015-11-14 DIAGNOSIS — D509 Iron deficiency anemia, unspecified: Secondary | ICD-10-CM | POA: Diagnosis not present

## 2015-11-14 DIAGNOSIS — Z992 Dependence on renal dialysis: Secondary | ICD-10-CM | POA: Diagnosis not present

## 2015-11-14 DIAGNOSIS — N2581 Secondary hyperparathyroidism of renal origin: Secondary | ICD-10-CM | POA: Diagnosis not present

## 2015-11-14 DIAGNOSIS — N186 End stage renal disease: Secondary | ICD-10-CM | POA: Diagnosis not present

## 2015-11-14 DIAGNOSIS — D631 Anemia in chronic kidney disease: Secondary | ICD-10-CM | POA: Diagnosis not present

## 2015-11-17 ENCOUNTER — Encounter: Payer: Self-pay | Admitting: Surgery

## 2015-11-17 DIAGNOSIS — N2581 Secondary hyperparathyroidism of renal origin: Secondary | ICD-10-CM | POA: Diagnosis not present

## 2015-11-17 DIAGNOSIS — D509 Iron deficiency anemia, unspecified: Secondary | ICD-10-CM | POA: Diagnosis not present

## 2015-11-17 DIAGNOSIS — Z992 Dependence on renal dialysis: Secondary | ICD-10-CM | POA: Diagnosis not present

## 2015-11-17 DIAGNOSIS — D631 Anemia in chronic kidney disease: Secondary | ICD-10-CM | POA: Diagnosis not present

## 2015-11-17 DIAGNOSIS — N186 End stage renal disease: Secondary | ICD-10-CM | POA: Diagnosis not present

## 2015-11-19 ENCOUNTER — Encounter: Payer: Self-pay | Admitting: Cardiology

## 2015-11-19 ENCOUNTER — Telehealth: Payer: Self-pay | Admitting: Cardiology

## 2015-11-19 DIAGNOSIS — D631 Anemia in chronic kidney disease: Secondary | ICD-10-CM | POA: Diagnosis not present

## 2015-11-19 DIAGNOSIS — D509 Iron deficiency anemia, unspecified: Secondary | ICD-10-CM | POA: Diagnosis not present

## 2015-11-19 DIAGNOSIS — N186 End stage renal disease: Secondary | ICD-10-CM | POA: Diagnosis not present

## 2015-11-19 DIAGNOSIS — N2581 Secondary hyperparathyroidism of renal origin: Secondary | ICD-10-CM | POA: Diagnosis not present

## 2015-11-19 DIAGNOSIS — Z992 Dependence on renal dialysis: Secondary | ICD-10-CM | POA: Diagnosis not present

## 2015-11-19 NOTE — Telephone Encounter (Signed)
Called pt to get updated information on pt's medical and family history. Pt updated his information, but did not have information concerning his medications. Pt stated that he would call back with that information. I advised the pt that if he has any other problems, questions or concerns to call you office. Pt verbalized understanding.

## 2015-11-21 DIAGNOSIS — Z992 Dependence on renal dialysis: Secondary | ICD-10-CM | POA: Diagnosis not present

## 2015-11-21 DIAGNOSIS — D509 Iron deficiency anemia, unspecified: Secondary | ICD-10-CM | POA: Diagnosis not present

## 2015-11-21 DIAGNOSIS — D631 Anemia in chronic kidney disease: Secondary | ICD-10-CM | POA: Diagnosis not present

## 2015-11-21 DIAGNOSIS — N186 End stage renal disease: Secondary | ICD-10-CM | POA: Diagnosis not present

## 2015-11-21 DIAGNOSIS — N2581 Secondary hyperparathyroidism of renal origin: Secondary | ICD-10-CM | POA: Diagnosis not present

## 2015-11-23 ENCOUNTER — Ambulatory Visit (INDEPENDENT_AMBULATORY_CARE_PROVIDER_SITE_OTHER)
Admission: RE | Admit: 2015-11-23 | Discharge: 2015-11-23 | Disposition: A | Discharge status: Home / Self Care | Payer: Medicare Other | Source: Ambulatory Visit | Attending: Surgery | Admitting: Surgery

## 2015-11-23 ENCOUNTER — Ambulatory Visit (HOSPITAL_COMMUNITY)
Admission: RE | Admit: 2015-11-23 | Discharge: 2015-11-23 | Disposition: A | Discharge status: Home / Self Care | Payer: Medicare Other | Source: Ambulatory Visit | Attending: Surgery | Admitting: Surgery

## 2015-11-23 ENCOUNTER — Ambulatory Visit (INDEPENDENT_AMBULATORY_CARE_PROVIDER_SITE_OTHER): Payer: Medicare Other | Admitting: Surgery

## 2015-11-23 ENCOUNTER — Other Ambulatory Visit: Payer: Self-pay

## 2015-11-23 ENCOUNTER — Encounter: Payer: Self-pay | Admitting: Surgery

## 2015-11-23 VITALS — BP 154/97 | HR 73 | Temp 97.7°F | Resp 16 | Ht 74.0 in | Wt 191.8 lb

## 2015-11-23 DIAGNOSIS — I120 Hypertensive chronic kidney disease with stage 5 chronic kidney disease or end stage renal disease: Secondary | ICD-10-CM | POA: Diagnosis not present

## 2015-11-23 DIAGNOSIS — I651 Occlusion and stenosis of basilar artery: Secondary | ICD-10-CM | POA: Diagnosis not present

## 2015-11-23 DIAGNOSIS — Z992 Dependence on renal dialysis: Secondary | ICD-10-CM

## 2015-11-23 DIAGNOSIS — N186 End stage renal disease: Secondary | ICD-10-CM

## 2015-11-23 DIAGNOSIS — Z01810 Encounter for preprocedural cardiovascular examination: Secondary | ICD-10-CM | POA: Diagnosis not present

## 2015-11-23 NOTE — Progress Notes (Signed)
Vascular and Vein Specialist of [facility]  Patient name: Corey [name] MRN: [id number] DOB: 1970-10-11 Sex: male  REFERRING PHYSICIAN: Renal (?Dr. Marval Regal  REASON FOR CONSULT: dialysis access  HPI: Corey [name] is a 45 y.o. male, who is referred today for permanent access.  The patient developed renal failure in 2004 following a type a dissection.  He has had access in his right arm with grafts which have lasted since then.  These recently failed and a catheter was placed.  The patient is left-handed.  He is on dialysis Tuesday Thursday Saturday.  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  He was in a coma for 3 months.  He had a tracheotomy.  The patient is on Coumadin for atrial fibrillation.  He is a nonsmoker.  He is medically managed for hypertension.  Past Medical History:  Diagnosis Date  . A-fib ([place])   . Blindness and low vision   . ESRD (end stage renal disease) ([place])   . Hypertension     Family History  Problem Relation Age of Onset  . Cancer Mother   . Hypertension Mother   . Cancer Father   . Hypertension Father   . Thyroid disease Sister   . Stroke Brother     11    SOCIAL HISTORY: Social History   Social History  . Marital status: Divorced    Spouse name: N/A  . Number of children: 2  . Years of education: N/A   Occupational History  . DIABLED    Social History Main Topics  . Smoking status: Never Smoker  . Smokeless tobacco: Never Used  . Alcohol use No  . Drug use: No  . Sexual activity: Not on file   Other Topics Concern  . Not on file   Social History Narrative  . No narrative on file    Allergies  Allergen Reactions  . Heparin     Current Outpatient Prescriptions  Medication Sig Dispense Refill  . cinacalcet (SENSIPAR) 60 MG tablet Take 60 mg by mouth daily.    [name] gabapentin (NEURONTIN) 100 MG capsule Take 100 mg by mouth daily.    [name] labetalol (NORMODYNE) 300 MG tablet Take 300  mg by mouth 2 (two) times daily.    [name] warfarin (COUMADIN) 7.5 MG tablet Take 7.5 mg by mouth daily.     No current facility-administered medications for this visit.     REVIEW OF SYSTEMS:  [X]  denotes positive finding, [ ]  denotes negative finding Cardiac  Comments:  Chest pain or chest pressure:    Shortness of breath upon exertion:    Short of breath when lying flat:    Irregular heart rhythm:        Vascular    Pain in calf, thigh, or hip brought on by ambulation:    Pain in feet at night that wakes you up from your sleep:     Blood clot in your veins:    Leg swelling:         Pulmonary    Oxygen at home:    Productive cough:     Wheezing:         Neurologic    Sudden weakness in arms or legs:  x   Sudden numbness in arms or legs:  x   Sudden onset of difficulty speaking or slurred speech:    Temporary loss of vision in one eye:     Problems with dizziness:  Gastrointestinal    Blood in stool:     Vomited blood:         Genitourinary    Burning when urinating:     Blood in urine:        Psychiatric    Major depression:         Hematologic    Bleeding problems:    Problems with blood clotting too easily:        Skin    Rashes or ulcers:        Constitutional    Fever or chills:      PHYSICAL EXAM: Vitals:   11/23/15 1206  BP: (!) 160/97  Pulse: 73  Resp: 16  Temp: 97.7 F (36.5 C)  TempSrc: Oral  SpO2: 100%  Weight: 191 lb 12.8 oz (87 kg)  Height: 6\' 2"  (1.88 m)    GENERAL: The patient is a well-nourished male, in no acute distress. The vital signs are documented above. CARDIAC: There is a regular rate and rhythm.  VASCULAR: Palpable left radial and brachial pulse PULMONARY: There is good air exchange bilaterally without wheezing or rales. MUSCULOSKELETAL: There are no major deformities or cyanosis. NEUROLOGIC: No focal weakness or paresthesias are detected. SKIN: There are no ulcers or rashes noted. PSYCHIATRIC: The patient has a  normal affect.  DATA:  I have reviewed his vascular lab studies.  He appears to have a normal arterial study.  The basilic vein on the left is of excellent caliber and very marginal cephalic on the left.  MEDICAL ISSUES: Because of the procedure note from Dr. Jeani Hawking, the patient has altered level venous stents on the right as well as to failed right access.  Even though the patient is left-handed, I have recommended proceeding with a left-sided access.  He appears to have an excellent left basilic vein.  I discussed doing a left first stage basilic vein procedure on Wednesday, September 6.  I discussed the need for second operation, the risk of non-maturity.  All his questions were answered.  He will need to be off of his Coumadin prior to his operation.   Annamarie Major, MD Vascular and Vein Specialists of [facility] [phone] Pager [phone]

## 2015-11-24 DIAGNOSIS — N2581 Secondary hyperparathyroidism of renal origin: Secondary | ICD-10-CM | POA: Diagnosis not present

## 2015-11-24 DIAGNOSIS — D509 Iron deficiency anemia, unspecified: Secondary | ICD-10-CM | POA: Diagnosis not present

## 2015-11-24 DIAGNOSIS — Z992 Dependence on renal dialysis: Secondary | ICD-10-CM | POA: Diagnosis not present

## 2015-11-24 DIAGNOSIS — N186 End stage renal disease: Secondary | ICD-10-CM | POA: Diagnosis not present

## 2015-11-24 DIAGNOSIS — D631 Anemia in chronic kidney disease: Secondary | ICD-10-CM | POA: Diagnosis not present

## 2015-11-26 DIAGNOSIS — I482 Chronic atrial fibrillation: Secondary | ICD-10-CM | POA: Diagnosis not present

## 2015-11-26 DIAGNOSIS — D631 Anemia in chronic kidney disease: Secondary | ICD-10-CM | POA: Diagnosis not present

## 2015-11-26 DIAGNOSIS — N186 End stage renal disease: Secondary | ICD-10-CM | POA: Diagnosis not present

## 2015-11-26 DIAGNOSIS — Z992 Dependence on renal dialysis: Secondary | ICD-10-CM | POA: Diagnosis not present

## 2015-11-26 DIAGNOSIS — N2581 Secondary hyperparathyroidism of renal origin: Secondary | ICD-10-CM | POA: Diagnosis not present

## 2015-11-26 DIAGNOSIS — D509 Iron deficiency anemia, unspecified: Secondary | ICD-10-CM | POA: Diagnosis not present

## 2015-11-28 DIAGNOSIS — D509 Iron deficiency anemia, unspecified: Secondary | ICD-10-CM | POA: Diagnosis not present

## 2015-11-28 DIAGNOSIS — Z992 Dependence on renal dialysis: Secondary | ICD-10-CM | POA: Diagnosis not present

## 2015-11-28 DIAGNOSIS — N2581 Secondary hyperparathyroidism of renal origin: Secondary | ICD-10-CM | POA: Diagnosis not present

## 2015-11-28 DIAGNOSIS — N186 End stage renal disease: Secondary | ICD-10-CM | POA: Diagnosis not present

## 2015-11-28 DIAGNOSIS — D631 Anemia in chronic kidney disease: Secondary | ICD-10-CM | POA: Diagnosis not present

## 2015-12-01 DIAGNOSIS — Z992 Dependence on renal dialysis: Secondary | ICD-10-CM | POA: Diagnosis not present

## 2015-12-01 DIAGNOSIS — D631 Anemia in chronic kidney disease: Secondary | ICD-10-CM | POA: Diagnosis not present

## 2015-12-01 DIAGNOSIS — D509 Iron deficiency anemia, unspecified: Secondary | ICD-10-CM | POA: Diagnosis not present

## 2015-12-01 DIAGNOSIS — N186 End stage renal disease: Secondary | ICD-10-CM | POA: Diagnosis not present

## 2015-12-01 DIAGNOSIS — N2581 Secondary hyperparathyroidism of renal origin: Secondary | ICD-10-CM | POA: Diagnosis not present

## 2015-12-02 ENCOUNTER — Encounter (INDEPENDENT_AMBULATORY_CARE_PROVIDER_SITE_OTHER): Payer: Self-pay

## 2015-12-02 ENCOUNTER — Encounter: Payer: Self-pay | Admitting: Cardiology

## 2015-12-02 ENCOUNTER — Ambulatory Visit (INDEPENDENT_AMBULATORY_CARE_PROVIDER_SITE_OTHER): Payer: Medicare Other | Admitting: Cardiology

## 2015-12-02 VITALS — BP 140/98 | HR 73 | Ht 74.0 in | Wt 195.0 lb

## 2015-12-02 DIAGNOSIS — Z8679 Personal history of other diseases of the circulatory system: Secondary | ICD-10-CM | POA: Diagnosis not present

## 2015-12-02 DIAGNOSIS — N186 End stage renal disease: Secondary | ICD-10-CM

## 2015-12-02 DIAGNOSIS — I10 Essential (primary) hypertension: Secondary | ICD-10-CM

## 2015-12-02 DIAGNOSIS — I480 Paroxysmal atrial fibrillation: Secondary | ICD-10-CM | POA: Diagnosis not present

## 2015-12-02 DIAGNOSIS — Z7901 Long term (current) use of anticoagulants: Secondary | ICD-10-CM | POA: Diagnosis not present

## 2015-12-02 NOTE — Progress Notes (Signed)
Cardiology Office Note    Date:  12/02/2015   ID:  Corey [name], DOB 05-02-1970, MRN [id number]  PCP:  No primary care provider on file.  Cardiologist:   Candee Furbish, MD     History of Present Illness:  Corey [name] is a 45 y.o. male here for evaluation of atrial fibrillation with end-stage renal disease on hemodialysis, prior aortic dissection in 2004 with extensive repair.  Current EKG today on 12/02/15 shows normal sinus rhythm heart rate 73 bpm with no other significant abnormality. Personally viewed.  He has a history of atrial fibrillation on anticoagulation, transfer from [place]. Has a prior aortic dissection repair on hemodialysis since 2004.  Repaired aorta (remembers having veins out of legs as well). 2014 - cardioversion worked (140bpm). He describes today that his aorta dissected quite vividly, remembers eating dinner, cleaning the bowl he states and then feeling an extensive excruciating pain in his back like a "pop ".    Prolonged hospitalization he states 2 years, 3 months in a coma. He was paralyzed from the waist down. Had a pneumothorax as well.  Overall, he is not having any chest pain, shortness of breath. Tolerating hemodialysis well. No syncope, no bleeding. No recent palpitations.   Past Medical History:  Diagnosis Date  . A-fib ([place])   . Blindness and low vision   . ESRD (end stage renal disease) ([place])   . Hypertension     Past Surgical History:  Procedure Laterality Date  . NONE      Current Medications: Outpatient Medications Prior to Visit  Medication Sig Dispense Refill  . cinacalcet (SENSIPAR) 60 MG tablet Take 60 mg by mouth daily.    [name] gabapentin (NEURONTIN) 100 MG capsule Take 100 mg by mouth daily.    [name] labetalol (NORMODYNE) 300 MG tablet Take 300 mg by mouth 2 (two) times daily.    . sevelamer carbonate (RENVELA) 800 MG tablet Take 800 mg by mouth 3 (three) times daily with meals.    . warfarin (COUMADIN) 7.5 MG tablet Take 7.5 mg by  mouth daily.     No facility-administered medications prior to visit.      Allergies:   Heparin   Social History   Social History  . Marital status: Divorced    Spouse name: N/A  . Number of children: 2  . Years of education: N/A   Occupational History  . DIABLED    Social History Main Topics  . Smoking status: Never Smoker  . Smokeless tobacco: Never Used  . Alcohol use No  . Drug use: No  . Sexual activity: Not Asked   Other Topics Concern  . None   Social History Narrative  . None     Family History:  The patient's family history includes Cancer in his father and mother; Hypertension in his father and mother; Stroke in his brother; Thyroid disease in his sister.   ROS:   Please see the history of present illness.    ROS All other systems reviewed and are negative.   PHYSICAL EXAM:   VS:  BP (!) 140/98   Pulse 73   Ht 6\' 2"  (1.88 m)   Wt 195 lb (88.5 kg)   BMI 25.04 kg/m    GEN: Well nourished, well developed, in no acute distress  HEENT: normal  Neck: no JVD, carotid bruits, or masses Cardiac: RRR; no murmurs, rubs, or gallops,no edema  Respiratory:  clear to auscultation bilaterally, normal work of breathing GI:  soft, nontender, nondistended, + BS MS: no deformity or atrophy  Skin: warm and dry, no rash, compression hose, dialysis shunt noted Neuro:  Alert and Oriented x 3, Strength and sensation are intact Psych: euthymic mood, full affect  Wt Readings from Last 3 Encounters:  12/02/15 195 lb (88.5 kg)  11/23/15 191 lb 12.8 oz (87 kg)      Studies/Labs Reviewed:   EKG:  EKG is ordered today.  The ekg ordered today demonstrates 12/02/15-sinus rhythm 73 bpm with no other abnormalities.  Recent Labs: No results found for requested labs within last 8760 hours.   Lipid Panel No results found for: CHOL, TRIG, HDL, CHOLHDL, VLDL, LDLCALC, LDLDIRECT  Additional studies/ records that were reviewed today include:   Dialysis office notes reviewed.  Lab work reviewed. Awaiting records from [place].    ASSESSMENT:    1. Paroxysmal atrial fibrillation (HCC)   2. End stage renal disease ([place])   3. History of aortic dissection   4. Chronic anticoagulation   5. Essential hypertension      PLAN:  In order of problems listed above:  Paroxysmal atrial fibrillation  - Cardioversion 2014  - Currently normal sinus rhythm  - Has been on Coumadin since 2004  - We will continue given his extensive history of vascular repair.  - We will check echocardiogram.  Chronic anticoagulation  - We will have him see Coumadin clinic.  Aortic dissection  - 2004. Extensive repair. States that he was in the hospital for 2 years. Partial paralysis waist down. Renal failure.  - Father died of enlarged heart however he has no other family members with aortic dissection.  - He states he was cleaning his bowl after dinner and felt a pop in his back which was excruciating.  - He also states that they had to utilize veins in his leg as part of the repair, I wonder if he is had bypass to his coronary arteries as well or perhaps 2 other major vessels. Trying to obtain records from [place].  Essential hypertension  - Exposed the importance of continued close monitoring. Currently on labetalol.  - During hemodialysis, generally monitoring his blood pressure closely. If necessary, agents can be added by nephrology.      Medication Adjustments/Labs and Tests Ordered: Current medicines are reviewed at length with the patient today.  Concerns regarding medicines are outlined above.  Medication changes, Labs and Tests ordered today are listed in the Patient Instructions below. Patient Instructions  Medication Instructions:  The current medical regimen is effective;  continue present plan and medications.  Testing/Procedures: Your physician has requested that you have an echocardiogram. Echocardiography is a painless test that uses sound waves to create  images of your heart. It provides your doctor with information about the size and shape of your heart and how well your heart's chambers and valves are working. This procedure takes approximately one hour. There are no restrictions for this procedure.  Please schedule an appointment with the Coumadin Clinic.  Follow-Up: Follow up in 6 months with Dr. [name].  You will receive a letter in the mail 2 months before you are due.  Please call us when you receive this letter to schedule your follow up appointment.  If you need a refill on your cardiac medications before your next appointment, please call your pharmacy.  Thank you for choosing [facility]!!        Signed, Candee Furbish, MD  12/02/2015 4:46 PM  [facility] [place], [place], [place]  [address] Phone: [phone]; Fax: [phone]

## 2015-12-02 NOTE — Patient Instructions (Signed)
Medication Instructions:  The current medical regimen is effective;  continue present plan and medications.  Testing/Procedures: Your physician has requested that you have an echocardiogram. Echocardiography is a painless test that uses sound waves to create images of your heart. It provides your doctor with information about the size and shape of your heart and how well your heart's chambers and valves are working. This procedure takes approximately one hour. There are no restrictions for this procedure.  Please schedule an appointment with the Coumadin Clinic.  Follow-Up: Follow up in 6 months with Dr. [name].  You will receive a letter in the mail 2 months before you are due.  Please call us when you receive this letter to schedule your follow up appointment.  If you need a refill on your cardiac medications before your next appointment, please call your pharmacy.  Thank you for choosing [facility]!!

## 2015-12-03 DIAGNOSIS — I129 Hypertensive chronic kidney disease with stage 1 through stage 4 chronic kidney disease, or unspecified chronic kidney disease: Secondary | ICD-10-CM | POA: Diagnosis not present

## 2015-12-03 DIAGNOSIS — D509 Iron deficiency anemia, unspecified: Secondary | ICD-10-CM | POA: Diagnosis not present

## 2015-12-03 DIAGNOSIS — N186 End stage renal disease: Secondary | ICD-10-CM | POA: Diagnosis not present

## 2015-12-03 DIAGNOSIS — N2581 Secondary hyperparathyroidism of renal origin: Secondary | ICD-10-CM | POA: Diagnosis not present

## 2015-12-03 DIAGNOSIS — Z992 Dependence on renal dialysis: Secondary | ICD-10-CM | POA: Diagnosis not present

## 2015-12-03 DIAGNOSIS — D631 Anemia in chronic kidney disease: Secondary | ICD-10-CM | POA: Diagnosis not present

## 2015-12-05 DIAGNOSIS — N2581 Secondary hyperparathyroidism of renal origin: Secondary | ICD-10-CM | POA: Diagnosis not present

## 2015-12-05 DIAGNOSIS — Z992 Dependence on renal dialysis: Secondary | ICD-10-CM | POA: Diagnosis not present

## 2015-12-05 DIAGNOSIS — D509 Iron deficiency anemia, unspecified: Secondary | ICD-10-CM | POA: Diagnosis not present

## 2015-12-05 DIAGNOSIS — D631 Anemia in chronic kidney disease: Secondary | ICD-10-CM | POA: Diagnosis not present

## 2015-12-05 DIAGNOSIS — N186 End stage renal disease: Secondary | ICD-10-CM | POA: Diagnosis not present

## 2015-12-08 ENCOUNTER — Encounter (HOSPITAL_COMMUNITY): Payer: Self-pay | Admitting: *Deleted

## 2015-12-08 DIAGNOSIS — D509 Iron deficiency anemia, unspecified: Secondary | ICD-10-CM | POA: Diagnosis not present

## 2015-12-08 DIAGNOSIS — N186 End stage renal disease: Secondary | ICD-10-CM | POA: Diagnosis not present

## 2015-12-08 DIAGNOSIS — N2581 Secondary hyperparathyroidism of renal origin: Secondary | ICD-10-CM | POA: Diagnosis not present

## 2015-12-08 DIAGNOSIS — Z992 Dependence on renal dialysis: Secondary | ICD-10-CM | POA: Diagnosis not present

## 2015-12-08 DIAGNOSIS — D631 Anemia in chronic kidney disease: Secondary | ICD-10-CM | POA: Diagnosis not present

## 2015-12-08 MED ORDER — SODIUM CHLORIDE 0.9 % IV SOLN
INTRAVENOUS | Status: DC
  Administered 2015-12-09 (×2): via INTRAVENOUS

## 2015-12-08 MED ORDER — DEXTROSE 5 % IV SOLN
1.5000 g | INTRAVENOUS | Status: AC
  Administered 2015-12-09: 1.5 g via INTRAVENOUS
  Filled 2015-12-08: qty 1.5

## 2015-12-08 NOTE — Anesthesia Preprocedure Evaluation (Addendum)
Anesthesia Evaluation  Patient identified by MRN, date of birth, ID band Patient awake    Reviewed: Allergy & Precautions, NPO status , Patient's Chart, lab work & pertinent test results, reviewed documented beta blocker date and time   History of Anesthesia Complications Negative for: history of anesthetic complications  Airway Mallampati: III  TM Distance: >3 FB Neck ROM: Full    Dental  (+) Dental Advisory Given,    Pulmonary neg shortness of breath, neg sleep apnea, neg COPD, neg recent URI,    Pulmonary exam normal breath sounds clear to auscultation       Cardiovascular hypertension, Pt. on medications and Pt. on home beta blockers (-) angina(-) Past MI and (-) Cardiac Stents + dysrhythmias (s/p cardioversion in 2014) Atrial Fibrillation  Rhythm:Regular Rate:Normal  H/o Type A aortic dissection in 2004 s/p extensive repair   Neuro/Psych  Headaches, neg Seizures Neuropathy, paralysis of BLE due to aortic dissection in 2004    GI/Hepatic negative GI ROS, Neg liver ROS,   Endo/Other  negative endocrine ROS  Renal/GU ESRF and DialysisRenal disease     Musculoskeletal  (+) Arthritis ,   Abdominal   Peds  Hematology negative hematology ROS (+)   Anesthesia Other Findings Decreased vision  Patient with K+ on iSTAT of 2.8 this morning. Repeat on chemistry was 3.1. Patient also in afib with RVR into 140s. Did not take labetalol this morning. Patient given his normal dose of labetalol. Converted to NSR while still in preop holding.  Reproductive/Obstetrics                            Anesthesia Physical Anesthesia Plan  ASA: IV  Anesthesia Plan: MAC   Post-op Pain Management:    Induction: Intravenous  Airway Management Planned: Natural Airway and Simple Face Mask  Additional Equipment:   Intra-op Plan:   Post-operative Plan:   Informed Consent: I have reviewed the patients History  and Physical, chart, labs and discussed the procedure including the risks, benefits and alternatives for the proposed anesthesia with the patient or authorized representative who has indicated his/her understanding and acceptance.   Dental advisory given  Plan Discussed with: CRNA  Anesthesia Plan Comments: (Discussed peripheral nerve block with patient, which patient declined due to phobia of needles. Discussed MAC with backup GA.   Risks of general anesthesia discussed including, but not limited to, sore throat, hoarse voice, chipped/damaged teeth, injury to vocal cords, nausea and vomiting, allergic reactions, lung infection, heart attack, stroke, and death. All questions answered. )       Anesthesia Quick Evaluation

## 2015-12-08 NOTE — Progress Notes (Signed)
Pt has hx of A-fib and dissection of aorta. Pt has recently moved to this area, has seen Dr. [name] in August. Echo is ordered for next week. Pt's last dose of Coumadin was 12/05/15.

## 2015-12-09 ENCOUNTER — Encounter (HOSPITAL_COMMUNITY)
Admission: RE | Disposition: A | Discharge status: Home / Self Care | Payer: Self-pay | Source: Ambulatory Visit | Attending: Surgery

## 2015-12-09 ENCOUNTER — Ambulatory Visit (HOSPITAL_COMMUNITY): Payer: Medicare Other | Admitting: Anesthesiology

## 2015-12-09 ENCOUNTER — Ambulatory Visit (HOSPITAL_COMMUNITY)
Admission: RE | Admit: 2015-12-09 | Discharge: 2015-12-09 | Disposition: A | Discharge status: Home / Self Care | Payer: Medicare Other | Source: Ambulatory Visit | Attending: Surgery | Admitting: Surgery

## 2015-12-09 ENCOUNTER — Encounter (HOSPITAL_COMMUNITY): Payer: Self-pay | Admitting: Critical Care Medicine

## 2015-12-09 DIAGNOSIS — H540 Blindness, both eyes: Secondary | ICD-10-CM | POA: Insufficient documentation

## 2015-12-09 DIAGNOSIS — Z87891 Personal history of nicotine dependence: Secondary | ICD-10-CM | POA: Insufficient documentation

## 2015-12-09 DIAGNOSIS — I120 Hypertensive chronic kidney disease with stage 5 chronic kidney disease or end stage renal disease: Secondary | ICD-10-CM | POA: Insufficient documentation

## 2015-12-09 DIAGNOSIS — Z7901 Long term (current) use of anticoagulants: Secondary | ICD-10-CM | POA: Diagnosis not present

## 2015-12-09 DIAGNOSIS — I4891 Unspecified atrial fibrillation: Secondary | ICD-10-CM | POA: Insufficient documentation

## 2015-12-09 DIAGNOSIS — N186 End stage renal disease: Secondary | ICD-10-CM | POA: Insufficient documentation

## 2015-12-09 DIAGNOSIS — Z79899 Other long term (current) drug therapy: Secondary | ICD-10-CM | POA: Diagnosis not present

## 2015-12-09 DIAGNOSIS — Z992 Dependence on renal dialysis: Secondary | ICD-10-CM | POA: Diagnosis not present

## 2015-12-09 DIAGNOSIS — N185 Chronic kidney disease, stage 5: Secondary | ICD-10-CM | POA: Diagnosis not present

## 2015-12-09 HISTORY — PX: BASCILIC VEIN TRANSPOSITION: SHX5742

## 2015-12-09 HISTORY — DX: Dissection of unspecified site of aorta: I71.00

## 2015-12-09 HISTORY — DX: Headache, unspecified: R51.9

## 2015-12-09 HISTORY — DX: Polyneuropathy, unspecified: G62.9

## 2015-12-09 HISTORY — DX: Other specified postprocedural states: Z98.890

## 2015-12-09 HISTORY — DX: Headache: R51

## 2015-12-09 HISTORY — DX: Unspecified osteoarthritis, unspecified site: M19.90

## 2015-12-09 HISTORY — DX: Pneumonia, unspecified organism: J18.9

## 2015-12-09 HISTORY — DX: Paralytic syndrome, unspecified: G83.9

## 2015-12-09 LAB — POCT I-STAT 4, (NA,K, GLUC, HGB,HCT)
Glucose, Bld: 93 mg/dL (ref 65–99)
HCT: 41 % (ref 39.0–52.0)
Hemoglobin: 13.9 g/dL (ref 13.0–17.0)
POTASSIUM: 2.8 mmol/L — AB (ref 3.5–5.1)
SODIUM: 141 mmol/L (ref 135–145)

## 2015-12-09 LAB — CBC
HEMATOCRIT: 41.6 % (ref 39.0–52.0)
Hemoglobin: 13.1 g/dL (ref 13.0–17.0)
MCH: 29.9 pg (ref 26.0–34.0)
MCHC: 31.5 g/dL (ref 30.0–36.0)
MCV: 95 fL (ref 78.0–100.0)
Platelets: 175 10*3/uL (ref 150–400)
RBC: 4.38 MIL/uL (ref 4.22–5.81)
RDW: 14.2 % (ref 11.5–15.5)
WBC: 10.5 10*3/uL (ref 4.0–10.5)

## 2015-12-09 LAB — PROTIME-INR
INR: 1.09
PROTHROMBIN TIME: 14.2 s (ref 11.4–15.2)

## 2015-12-09 LAB — APTT: aPTT: 35 seconds (ref 24–36)

## 2015-12-09 LAB — POTASSIUM: Potassium: 3.1 mmol/L — ABNORMAL LOW (ref 3.5–5.1)

## 2015-12-09 SURGERY — TRANSPOSITION, VEIN, BASILIC
Anesthesia: Monitor Anesthesia Care | Site: Arm Upper | Laterality: Left

## 2015-12-09 MED ORDER — PROPOFOL 10 MG/ML IV BOLUS
INTRAVENOUS | Status: AC
  Filled 2015-12-09: qty 20

## 2015-12-09 MED ORDER — ONDANSETRON HCL 4 MG/2ML IJ SOLN
INTRAMUSCULAR | Status: AC
  Filled 2015-12-09: qty 2

## 2015-12-09 MED ORDER — ROCURONIUM BROMIDE 10 MG/ML (PF) SYRINGE
PREFILLED_SYRINGE | INTRAVENOUS | Status: AC
  Filled 2015-12-09: qty 10

## 2015-12-09 MED ORDER — MIDAZOLAM HCL 5 MG/5ML IJ SOLN
INTRAMUSCULAR | Status: DC | PRN
  Administered 2015-12-09: 2 mg via INTRAVENOUS

## 2015-12-09 MED ORDER — FENTANYL CITRATE (PF) 100 MCG/2ML IJ SOLN
INTRAMUSCULAR | Status: AC
  Filled 2015-12-09: qty 2

## 2015-12-09 MED ORDER — PROPOFOL 500 MG/50ML IV EMUL
INTRAVENOUS | Status: DC | PRN
Start: 2015-12-09 — End: 2015-12-09
  Administered 2015-12-09: 40 ug/kg/min via INTRAVENOUS

## 2015-12-09 MED ORDER — ANTICOAGULANT SODIUM CITRATE 4% (200MG/5ML) IV SOLN
5.0000 mL | Freq: Once | Status: AC
  Administered 2015-12-09: 1.9 mL via INTRAVENOUS
  Filled 2015-12-09: qty 250

## 2015-12-09 MED ORDER — FENTANYL CITRATE (PF) 100 MCG/2ML IJ SOLN
25.0000 ug | INTRAMUSCULAR | Status: DC | PRN
  Administered 2015-12-09 (×2): 50 ug via INTRAVENOUS

## 2015-12-09 MED ORDER — PROMETHAZINE HCL 25 MG/ML IJ SOLN: 6.2500 mg | INTRAMUSCULAR | Status: DC | PRN

## 2015-12-09 MED ORDER — LIDOCAINE HCL (PF) 1 % IJ SOLN
INTRAMUSCULAR | Status: AC
  Filled 2015-12-09: qty 30

## 2015-12-09 MED ORDER — LIDOCAINE HCL (PF) 1 % IJ SOLN
INTRAMUSCULAR | Status: AC
  Filled 2015-12-09: qty 60

## 2015-12-09 MED ORDER — OXYCODONE-ACETAMINOPHEN 5-325 MG PO TABS: 1.0000 | ORAL_TABLET | Freq: Four times a day (QID) | ORAL | 0 refills | Status: DC | PRN

## 2015-12-09 MED ORDER — PROPOFOL 10 MG/ML IV BOLUS
INTRAVENOUS | Status: DC | PRN
  Administered 2015-12-09: 10 mg via INTRAVENOUS

## 2015-12-09 MED ORDER — OXYCODONE-ACETAMINOPHEN 5-325 MG PO TABS
ORAL_TABLET | ORAL | Status: AC
  Administered 2015-12-09: 1
  Filled 2015-12-09: qty 1

## 2015-12-09 MED ORDER — OXYCODONE-ACETAMINOPHEN 5-325 MG PO TABS
1.0000 | ORAL_TABLET | Freq: Once | ORAL | Status: AC
  Administered 2015-12-09: 1 via ORAL

## 2015-12-09 MED ORDER — 0.9 % SODIUM CHLORIDE (POUR BTL) OPTIME
TOPICAL | Status: DC | PRN
  Administered 2015-12-09: 1000 mL

## 2015-12-09 MED ORDER — SODIUM CHLORIDE 0.9 % IJ SOLN
INTRAMUSCULAR | Status: DC | PRN
  Administered 2015-12-09: 500 mL via INTRAVENOUS

## 2015-12-09 MED ORDER — LABETALOL HCL 300 MG PO TABS
300.0000 mg | ORAL_TABLET | ORAL | Status: AC
  Administered 2015-12-09: 300 mg via ORAL
  Filled 2015-12-09: qty 1

## 2015-12-09 MED ORDER — CHLORHEXIDINE GLUCONATE CLOTH 2 % EX PADS: 6.0000 | MEDICATED_PAD | Freq: Once | CUTANEOUS | Status: DC

## 2015-12-09 MED ORDER — FENTANYL CITRATE (PF) 100 MCG/2ML IJ SOLN
INTRAMUSCULAR | Status: DC | PRN
  Administered 2015-12-09 (×2): 25 ug via INTRAVENOUS
  Administered 2015-12-09: 50 ug via INTRAVENOUS
  Administered 2015-12-09 (×2): 25 ug via INTRAVENOUS

## 2015-12-09 MED ORDER — PHENYLEPHRINE HCL 10 MG/ML IJ SOLN
INTRAVENOUS | Status: DC | PRN
  Administered 2015-12-09: 25 ug/min via INTRAVENOUS

## 2015-12-09 MED ORDER — LIDOCAINE 2% (20 MG/ML) 5 ML SYRINGE
INTRAMUSCULAR | Status: AC
  Filled 2015-12-09: qty 5

## 2015-12-09 MED ORDER — LIDOCAINE-EPINEPHRINE (PF) 1 %-1:200000 IJ SOLN
INTRAMUSCULAR | Status: AC
  Filled 2015-12-09: qty 30

## 2015-12-09 MED ORDER — MIDAZOLAM HCL 2 MG/2ML IJ SOLN
INTRAMUSCULAR | Status: AC
  Filled 2015-12-09: qty 2

## 2015-12-09 MED ORDER — LIDOCAINE HCL (PF) 1 % IJ SOLN
INTRAMUSCULAR | Status: DC | PRN
  Administered 2015-12-09: 6 mL

## 2015-12-09 MED ORDER — PHENYLEPHRINE 40 MCG/ML (10ML) SYRINGE FOR IV PUSH (FOR BLOOD PRESSURE SUPPORT)
PREFILLED_SYRINGE | INTRAVENOUS | Status: AC
  Filled 2015-12-09: qty 40

## 2015-12-09 MED ORDER — SODIUM CHLORIDE 0.9 % IV SOLN: INTRAVENOUS | Status: DC

## 2015-12-09 SURGICAL SUPPLY — 38 items
ARMBAND PINK RESTRICT EXTREMIT (MISCELLANEOUS) ×3 IMPLANT
CANISTER SUCTION 2500CC (MISCELLANEOUS) ×3 IMPLANT
CLIP TI MEDIUM 24 (CLIP) ×3 IMPLANT
CLIP TI WIDE RED SMALL 24 (CLIP) ×3 IMPLANT
CORDS BIPOLAR (ELECTRODE) IMPLANT
COVER PROBE W GEL 5X96 (DRAPES) IMPLANT
DECANTER SPIKE VIAL GLASS SM (MISCELLANEOUS) ×3 IMPLANT
ELECT REM PT RETURN 9FT ADLT (ELECTROSURGICAL) ×3
ELECTRODE REM PT RTRN 9FT ADLT (ELECTROSURGICAL) ×1 IMPLANT
GLOVE BIO SURGEON STRL SZ7 (GLOVE) ×3 IMPLANT
GLOVE BIOGEL PI IND STRL 6.5 (GLOVE) ×3 IMPLANT
GLOVE BIOGEL PI IND STRL 7.5 (GLOVE) ×2 IMPLANT
GLOVE BIOGEL PI IND STRL 8 (GLOVE) ×1 IMPLANT
GLOVE BIOGEL PI INDICATOR 6.5 (GLOVE) ×6
GLOVE BIOGEL PI INDICATOR 7.5 (GLOVE) ×4
GLOVE BIOGEL PI INDICATOR 8 (GLOVE) ×2
GLOVE ECLIPSE 6.5 STRL STRAW (GLOVE) ×6 IMPLANT
GLOVE ECLIPSE 7.0 STRL STRAW (GLOVE) ×3 IMPLANT
GOWN SPEC L4 XLG W/TWL (GOWN DISPOSABLE) ×3 IMPLANT
GOWN STRL REUS W/ TWL LRG LVL3 (GOWN DISPOSABLE) ×3 IMPLANT
GOWN STRL REUS W/TWL LRG LVL3 (GOWN DISPOSABLE) ×6
HEMOSTAT SPONGE AVITENE ULTRA (HEMOSTASIS) IMPLANT
KIT BASIN OR (CUSTOM PROCEDURE TRAY) ×3 IMPLANT
KIT ROOM TURNOVER OR (KITS) ×3 IMPLANT
LIQUID BAND (GAUZE/BANDAGES/DRESSINGS) ×3 IMPLANT
NS IRRIG 1000ML POUR BTL (IV SOLUTION) ×3 IMPLANT
PACK CV ACCESS (CUSTOM PROCEDURE TRAY) ×3 IMPLANT
PAD ARMBOARD 7.5X6 YLW CONV (MISCELLANEOUS) ×6 IMPLANT
SUT MNCRL AB 4-0 PS2 18 (SUTURE) ×3 IMPLANT
SUT PROLENE 6 0 BV (SUTURE) ×3 IMPLANT
SUT PROLENE 7 0 BV 1 (SUTURE) ×3 IMPLANT
SUT SILK 2 0 SH (SUTURE) IMPLANT
SUT VIC AB 2-0 CT1 27 (SUTURE) ×2
SUT VIC AB 2-0 CT1 TAPERPNT 27 (SUTURE) ×1 IMPLANT
SUT VIC AB 3-0 SH 27 (SUTURE) ×4
SUT VIC AB 3-0 SH 27X BRD (SUTURE) ×2 IMPLANT
UNDERPAD 30X30 (UNDERPADS AND DIAPERS) ×3 IMPLANT
WATER STERILE IRR 1000ML POUR (IV SOLUTION) ×3 IMPLANT

## 2015-12-09 NOTE — OR Nursing (Signed)
Pt discharge instructions were gone over with pt and pt cousin. Pt belongings were returned back to pt and denied missing any belongings. Pt and family member denied any questions regarding discharge teaching.

## 2015-12-09 NOTE — Progress Notes (Signed)
hemodialysis catheter accessed for IV.  10 cc blood wasted and flushed with NS 10 ml. 500 ml NS connected to blue port @ 10 ml/hr.

## 2015-12-09 NOTE — Op Note (Signed)
OPERATIVE NOTE   PROCEDURE: 1. left first stage basilic vein transposition (brachiobasilic arteriovenous fistula) placement  PRE-OPERATIVE DIAGNOSIS: end stage renal disease-HD: T/R/S   POST-OPERATIVE DIAGNOSIS: same as above   SURGEON: [name], MD  ASSISTANT(S): Gerri Lins, PAC   ANESTHESIA: local and MAC  ESTIMATED BLOOD LOSS: 50 cc  FINDING(S): 1. Large cubital vein draining into basilic vein: 4 mm 2. Palpable thrill at end of case with palpable radial pulse  SPECIMEN(S):  none  INDICATIONS:   Corey [name] is a 45 y.o. male who presents with end stage renal disease-HD: T/R/S .  The patient is scheduled for left first stage basilic vein transposition.  The patient is aware the risks include but are not limited to: bleeding, infection, steal syndrome, nerve damage, ischemic monomelic neuropathy, failure to mature, and need for additional procedures.  The patient is aware of the risks of the procedure and elects to proceed forward.   DESCRIPTION: After full informed written consent was obtained from the patient, the patient was brought back to the operating room and placed supine upon the operating table.  Prior to induction, the patient received IV antibiotics.   After obtaining adequate anesthesia, the patient was then prepped and draped in the standard fashion for a left arm access procedure.  I turned my attention first to identifying the patient's basilic vein and brachial artery.  In this process, I also found a large cubital vein draining into the basilic vein system.  Using SonoSite guidance, the location of these vessels were marked out on the skin.   The distance between the forearm segment of basilic vein and the brachial artery would require a parallel vein harvest.  The forearm segment of basilic vein looked to be smaller than the cubital vein, so I elected to use the cubital vein to complete the first stage basilic vein transposition.  At this point, I  injected local anesthetic to obtain a field block of the antecubitum.  In total, I injected about 5 mL of 1% lidocaine without epinephrine.  I made a transverse incision at the level of the antecubitum and dissected through the subcutaneous tissue and fascia to gain exposure of the brachial artery.  This was noted to be 4 mm in diameter externally.  This was dissected out proximally and distally and controlled with vessel loops .  In the process of dissecting out this artery, I found the large cubital vein crossing into the basilic vein.  This was noted to be 4 mm in diameter externally.  The distal segment of the vein was ligated with a  2-0 silk, and the vein was transected.  The proximal segment was interrogated with serial dilators.  The vein accepted up to a 4 mm dilator without any difficulty.  I then instilled the heparinized saline into the vein and clamped it.  At this point, I reset my exposure of the brachial artery and placed the artery under tension proximally and distally.  I made an arteriotomy with a #11 blade, and then I extended the arteriotomy with a Potts scissor.  I injected heparinized saline proximal and distal to this arteriotomy.  The vein was then sewn to the artery in an end-to-side configuration with a running stitch of 7-0 Prolene.  Prior to completing this anastomosis, I allowed the vein and artery to backbleed.  There was no evidence of clot from any vessels.  I completed the anastomosis in the usual fashion and then released all vessel loops and clamps.  There was a palpable thrill in the venous outflow, and there was a palpable radial pulse.  At this point, I irrigated out the surgical wound.  There was no further active bleeding.  The subcutaneous tissue was reapproximated with a running stitch of 3-0 Vicryl.  The skin was then reapproximated with a running subcuticular stitch of 4-0 Vicryl.  The skin was then cleaned, dried, and reinforced with Dermabond.  The patient tolerated  this procedure well.    COMPLICATIONS: none  CONDITION: stable   [name], MD, [facility] of [place] Office: [phone] Pager: [phone]  12/09/2015, 11:07 AM

## 2015-12-09 NOTE — H&P (View-Only) (Signed)
Vascular and Vein Specialist of [facility]  Patient name: Corey [name] MRN: [id number] DOB: [birth year] Sex: male  REFERRING PHYSICIAN: Renal (?Dr. Marval Regal  REASON FOR CONSULT: dialysis access  HPI: Corey [name] is a 45 y.o. male, who is referred today for permanent access.  The patient developed renal failure in 2004 following a type a dissection.  He has had access in his right arm with grafts which have lasted since then.  These recently failed and a catheter was placed.  The patient is left-handed.  He is on dialysis Tuesday Thursday Saturday.  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  He was in a coma for 3 months.  He had a tracheotomy.  The patient is on Coumadin for atrial fibrillation.  He is a nonsmoker.  He is medically managed for hypertension.  Past Medical History:  Diagnosis Date  . A-fib ([place])   . Blindness and low vision   . ESRD (end stage renal disease) ([place])   . Hypertension     Family History  Problem Relation Age of Onset  . Cancer Mother   . Hypertension Mother   . Cancer Father   . Hypertension Father   . Thyroid disease Sister   . Stroke Brother     11    SOCIAL HISTORY: Social History   Social History  . Marital status: Divorced    Spouse name: N/A  . Number of children: 2  . Years of education: N/A   Occupational History  . [employer]    Social History Main Topics  . Smoking status: Never Smoker  . Smokeless tobacco: Never Used  . Alcohol use No  . Drug use: No  . Sexual activity: Not on file   Other Topics Concern  . Not on file   Social History Narrative  . No narrative on file    Allergies  Allergen Reactions  . Heparin     Current Outpatient Prescriptions  Medication Sig Dispense Refill  . cinacalcet (SENSIPAR) 60 MG tablet Take 60 mg by mouth daily.    [name] gabapentin (NEURONTIN) 100 MG capsule Take 100 mg by mouth daily.    [name] labetalol (NORMODYNE) 300 MG tablet Take 300  mg by mouth 2 (two) times daily.    [name] warfarin (COUMADIN) 7.5 MG tablet Take 7.5 mg by mouth daily.     No current facility-administered medications for this visit.     REVIEW OF SYSTEMS:  [X]  denotes positive finding, [ ]  denotes negative finding Cardiac  Comments:  Chest pain or chest pressure:    Shortness of breath upon exertion:    Short of breath when lying flat:    Irregular heart rhythm:        Vascular    Pain in calf, thigh, or hip brought on by ambulation:    Pain in feet at night that wakes you up from your sleep:     Blood clot in your veins:    Leg swelling:         Pulmonary    Oxygen at home:    Productive cough:     Wheezing:         Neurologic    Sudden weakness in arms or legs:  x   Sudden numbness in arms or legs:  x   Sudden onset of difficulty speaking or slurred speech:    Temporary loss of vision in one eye:     Problems with dizziness:  Gastrointestinal    Blood in stool:     Vomited blood:         Genitourinary    Burning when urinating:     Blood in urine:        Psychiatric    Major depression:         Hematologic    Bleeding problems:    Problems with blood clotting too easily:        Skin    Rashes or ulcers:        Constitutional    Fever or chills:      PHYSICAL EXAM: Vitals:   11/23/15 1206  BP: (!) 160/97  Pulse: 73  Resp: 16  Temp: 97.7 F (36.5 C)  TempSrc: Oral  SpO2: 100%  Weight: 191 lb 12.8 oz (87 kg)  Height: 6\' 2"  (1.88 m)    GENERAL: The patient is a well-nourished male, in no acute distress. The vital signs are documented above. CARDIAC: There is a regular rate and rhythm.  VASCULAR: Palpable left radial and brachial pulse PULMONARY: There is good air exchange bilaterally without wheezing or rales. MUSCULOSKELETAL: There are no major deformities or cyanosis. NEUROLOGIC: No focal weakness or paresthesias are detected. SKIN: There are no ulcers or rashes noted. PSYCHIATRIC: The patient has a  normal affect.  DATA:  I have reviewed his vascular lab studies.  He appears to have a normal arterial study.  The basilic vein on the left is of excellent caliber and very marginal cephalic on the left.  MEDICAL ISSUES: Because of the procedure note from Dr. Jeani Hawking, the patient has altered level venous stents on the right as well as to failed right access.  Even though the patient is left-handed, I have recommended proceeding with a left-sided access.  He appears to have an excellent left basilic vein.  I discussed doing a left first stage basilic vein procedure on Wednesday, September 6.  I discussed the need for second operation, the risk of non-maturity.  All his questions were answered.  He will need to be off of his Coumadin prior to his operation.   Annamarie Major, MD Vascular and Vein Specialists of [facility] [phone] Pager [phone]

## 2015-12-09 NOTE — Anesthesia Procedure Notes (Signed)
Procedure Name: MAC Date/Time: 12/09/2015 9:55 AM Performed by: Merrilyn Puma B Pre-anesthesia Checklist: Patient identified, Emergency Drugs available, Suction available, Patient being monitored and Timeout performed Patient Re-evaluated:Patient Re-evaluated prior to inductionOxygen Delivery Method: Simple face mask Intubation Type: IV induction Placement Confirmation: breath sounds checked- equal and bilateral,  CO2 detector and positive ETCO2 Dental Injury: Teeth and Oropharynx as per pre-operative assessment

## 2015-12-09 NOTE — Progress Notes (Addendum)
Dr. Cheral Bay notified of potassium 2.8 and elevated heart rate,EKG done ,Shows atrial fib . Pt. Has history of A-Fib.   Dr. Cheral Bay aware of pt. Complaint of chest tightness and pressure.   Repeat potassium.  Dr. [name] in and is aware of situation.

## 2015-12-09 NOTE — Interval H&P Note (Signed)
Vascular and Vein Specialists of [place]  History and Physical Update  The patient was interviewed and re-examined.  The patient's previous History and Physical has been reviewed and is unchanged from Dr. Stephens Shire consult.  There is no change in the plan of care: Left first stage basilic vein transposition.   Risk, benefits, and alternatives to access surgery were discussed.    The patient is aware the risks include but are not limited to: bleeding, infection, steal syndrome, nerve damage, ischemic monomelic neuropathy, failure to mature, need for additional procedures, death and stroke.    The patient is aware that his procedure is staged, i.e. Second operation will be necessary.  The patient agrees to proceed forward with the procedure.   [name], MD Vascular and Vein Specialists of [place] Office: [phone] Pager: [phone]  12/09/2015, 7:55 AM

## 2015-12-09 NOTE — Transfer of Care (Signed)
Immediate Anesthesia Transfer of Care Note  Patient: Corey [name]  Procedure(s) Performed: Procedure(s): FIRST STAGE BASILIC VEIN TRANSPOSITION LEFT UPPER ARM (Left)  Patient Location: PACU  Anesthesia Type:MAC  Level of Consciousness: awake, alert  and oriented  Airway & Oxygen Therapy: Patient Spontanous Breathing  Post-op Assessment: Report given to RN and Post -op Vital signs reviewed and stable  Post vital signs: Reviewed and stable  Last Vitals:  Vitals:   12/09/15 0756 12/09/15 0905  BP: 101/75 124/81  Pulse: 91 (!) 127  Resp: 18 17  Temp: 36.6 C     Last Pain:  Vitals:   12/09/15 0846  TempSrc:   PainSc: 2          Complications: No apparent anesthesia complications

## 2015-12-09 NOTE — Anesthesia Postprocedure Evaluation (Signed)
Anesthesia Post Note  Patient: Corey [name]  Procedure(s) Performed: Procedure(s) (LRB): FIRST STAGE BASILIC VEIN TRANSPOSITION LEFT UPPER ARM (Left)  Patient location during evaluation: PACU Anesthesia Type: MAC Level of consciousness: awake and alert Pain management: pain level controlled Vital Signs Assessment: post-procedure vital signs reviewed and stable Respiratory status: spontaneous breathing, nonlabored ventilation and respiratory function stable Cardiovascular status: stable and blood pressure returned to baseline Anesthetic complications: no    Last Vitals:  Vitals:   12/09/15 1140 12/09/15 1155  BP: 108/73 115/68  Pulse: 92 82  Resp: 18 18  Temp:      Last Pain:  Vitals:   12/09/15 1159  TempSrc:   PainSc: 4                  Nilda Simmer

## 2015-12-10 ENCOUNTER — Encounter (HOSPITAL_COMMUNITY): Payer: Self-pay | Admitting: Vascular Surgery

## 2015-12-10 DIAGNOSIS — D509 Iron deficiency anemia, unspecified: Secondary | ICD-10-CM | POA: Diagnosis not present

## 2015-12-10 DIAGNOSIS — N186 End stage renal disease: Secondary | ICD-10-CM | POA: Diagnosis not present

## 2015-12-10 DIAGNOSIS — N2581 Secondary hyperparathyroidism of renal origin: Secondary | ICD-10-CM | POA: Diagnosis not present

## 2015-12-10 DIAGNOSIS — D631 Anemia in chronic kidney disease: Secondary | ICD-10-CM | POA: Diagnosis not present

## 2015-12-10 DIAGNOSIS — Z992 Dependence on renal dialysis: Secondary | ICD-10-CM | POA: Diagnosis not present

## 2015-12-11 ENCOUNTER — Telehealth: Payer: Self-pay | Admitting: Vascular Surgery

## 2015-12-11 NOTE — Telephone Encounter (Signed)
-----   Message from Mena Goes, RN sent at 12/09/2015 12:06 PM EDT ----- Regarding: schedule   ----- Message ----- From: Ulyses Amor, PA-C Sent: 12/09/2015  11:30 AM To: Vvs Charge Pool  F/U with Dr. [name] in 6 weeks no study needed.  S/P av fistula left UE first stage.

## 2015-12-11 NOTE — Telephone Encounter (Signed)
spoke to pt advised of post op appt on 01/22/16 @ 9:30am with Dr [name], 12/11/15 beg

## 2015-12-12 DIAGNOSIS — N186 End stage renal disease: Secondary | ICD-10-CM | POA: Diagnosis not present

## 2015-12-12 DIAGNOSIS — D509 Iron deficiency anemia, unspecified: Secondary | ICD-10-CM | POA: Diagnosis not present

## 2015-12-12 DIAGNOSIS — N2581 Secondary hyperparathyroidism of renal origin: Secondary | ICD-10-CM | POA: Diagnosis not present

## 2015-12-12 DIAGNOSIS — Z992 Dependence on renal dialysis: Secondary | ICD-10-CM | POA: Diagnosis not present

## 2015-12-12 DIAGNOSIS — D631 Anemia in chronic kidney disease: Secondary | ICD-10-CM | POA: Diagnosis not present

## 2015-12-15 DIAGNOSIS — Z992 Dependence on renal dialysis: Secondary | ICD-10-CM | POA: Diagnosis not present

## 2015-12-15 DIAGNOSIS — D509 Iron deficiency anemia, unspecified: Secondary | ICD-10-CM | POA: Diagnosis not present

## 2015-12-15 DIAGNOSIS — D631 Anemia in chronic kidney disease: Secondary | ICD-10-CM | POA: Diagnosis not present

## 2015-12-15 DIAGNOSIS — N186 End stage renal disease: Secondary | ICD-10-CM | POA: Diagnosis not present

## 2015-12-15 DIAGNOSIS — N2581 Secondary hyperparathyroidism of renal origin: Secondary | ICD-10-CM | POA: Diagnosis not present

## 2015-12-17 DIAGNOSIS — N186 End stage renal disease: Secondary | ICD-10-CM | POA: Diagnosis not present

## 2015-12-17 DIAGNOSIS — N2581 Secondary hyperparathyroidism of renal origin: Secondary | ICD-10-CM | POA: Diagnosis not present

## 2015-12-17 DIAGNOSIS — D509 Iron deficiency anemia, unspecified: Secondary | ICD-10-CM | POA: Diagnosis not present

## 2015-12-17 DIAGNOSIS — D631 Anemia in chronic kidney disease: Secondary | ICD-10-CM | POA: Diagnosis not present

## 2015-12-17 DIAGNOSIS — Z992 Dependence on renal dialysis: Secondary | ICD-10-CM | POA: Diagnosis not present

## 2015-12-18 ENCOUNTER — Ambulatory Visit (HOSPITAL_COMMUNITY): Payer: Medicare Other | Attending: Cardiology

## 2015-12-19 DIAGNOSIS — Z992 Dependence on renal dialysis: Secondary | ICD-10-CM | POA: Diagnosis not present

## 2015-12-19 DIAGNOSIS — D509 Iron deficiency anemia, unspecified: Secondary | ICD-10-CM | POA: Diagnosis not present

## 2015-12-19 DIAGNOSIS — N2581 Secondary hyperparathyroidism of renal origin: Secondary | ICD-10-CM | POA: Diagnosis not present

## 2015-12-19 DIAGNOSIS — N186 End stage renal disease: Secondary | ICD-10-CM | POA: Diagnosis not present

## 2015-12-19 DIAGNOSIS — D631 Anemia in chronic kidney disease: Secondary | ICD-10-CM | POA: Diagnosis not present

## 2015-12-22 DIAGNOSIS — D631 Anemia in chronic kidney disease: Secondary | ICD-10-CM | POA: Diagnosis not present

## 2015-12-22 DIAGNOSIS — N2581 Secondary hyperparathyroidism of renal origin: Secondary | ICD-10-CM | POA: Diagnosis not present

## 2015-12-22 DIAGNOSIS — Z992 Dependence on renal dialysis: Secondary | ICD-10-CM | POA: Diagnosis not present

## 2015-12-22 DIAGNOSIS — N186 End stage renal disease: Secondary | ICD-10-CM | POA: Diagnosis not present

## 2015-12-22 DIAGNOSIS — D509 Iron deficiency anemia, unspecified: Secondary | ICD-10-CM | POA: Diagnosis not present

## 2015-12-23 ENCOUNTER — Encounter (INDEPENDENT_AMBULATORY_CARE_PROVIDER_SITE_OTHER): Payer: Self-pay

## 2015-12-23 ENCOUNTER — Ambulatory Visit: Payer: Medicare Other

## 2015-12-23 ENCOUNTER — Ambulatory Visit (INDEPENDENT_AMBULATORY_CARE_PROVIDER_SITE_OTHER): Payer: Medicare Other | Admitting: *Deleted

## 2015-12-23 DIAGNOSIS — I4891 Unspecified atrial fibrillation: Secondary | ICD-10-CM | POA: Insufficient documentation

## 2015-12-23 LAB — POCT INR: INR: 1.2

## 2015-12-23 MED ORDER — WARFARIN SODIUM 5 MG PO TABS: 5.0000 mg | ORAL_TABLET | ORAL | 1 refills | Status: DC

## 2015-12-24 DIAGNOSIS — N2581 Secondary hyperparathyroidism of renal origin: Secondary | ICD-10-CM | POA: Diagnosis not present

## 2015-12-24 DIAGNOSIS — Z992 Dependence on renal dialysis: Secondary | ICD-10-CM | POA: Diagnosis not present

## 2015-12-24 DIAGNOSIS — D509 Iron deficiency anemia, unspecified: Secondary | ICD-10-CM | POA: Diagnosis not present

## 2015-12-24 DIAGNOSIS — D631 Anemia in chronic kidney disease: Secondary | ICD-10-CM | POA: Diagnosis not present

## 2015-12-24 DIAGNOSIS — N186 End stage renal disease: Secondary | ICD-10-CM | POA: Diagnosis not present

## 2015-12-25 ENCOUNTER — Telehealth: Payer: Self-pay | Admitting: *Deleted

## 2015-12-25 ENCOUNTER — Other Ambulatory Visit: Payer: Self-pay | Admitting: *Deleted

## 2015-12-25 MED ORDER — WARFARIN SODIUM 5 MG PO TABS: ORAL_TABLET | ORAL | 0 refills | Status: DC

## 2015-12-25 NOTE — Telephone Encounter (Signed)
Returned a call to the pt & he stated that his Warfarin 5mg  has not been filled. After communicating with the pt he stated that he wasn't sure where he told [facility] to send because he is unfamiliar with the area because he is new to [place]. Spoke with [facility] & they will place the refill on hold, which will allow the [facility] pharmacy to fill. Called & spoke with April at [facility] she stated they would fill today.  Called the pt back & advised that the med will be ready today after lunch per April at [facility].

## 2015-12-26 DIAGNOSIS — D631 Anemia in chronic kidney disease: Secondary | ICD-10-CM | POA: Diagnosis not present

## 2015-12-26 DIAGNOSIS — Z992 Dependence on renal dialysis: Secondary | ICD-10-CM | POA: Diagnosis not present

## 2015-12-26 DIAGNOSIS — D509 Iron deficiency anemia, unspecified: Secondary | ICD-10-CM | POA: Diagnosis not present

## 2015-12-26 DIAGNOSIS — N186 End stage renal disease: Secondary | ICD-10-CM | POA: Diagnosis not present

## 2015-12-26 DIAGNOSIS — N2581 Secondary hyperparathyroidism of renal origin: Secondary | ICD-10-CM | POA: Diagnosis not present

## 2015-12-29 DIAGNOSIS — D631 Anemia in chronic kidney disease: Secondary | ICD-10-CM | POA: Diagnosis not present

## 2015-12-29 DIAGNOSIS — D509 Iron deficiency anemia, unspecified: Secondary | ICD-10-CM | POA: Diagnosis not present

## 2015-12-29 DIAGNOSIS — N2581 Secondary hyperparathyroidism of renal origin: Secondary | ICD-10-CM | POA: Diagnosis not present

## 2015-12-29 DIAGNOSIS — N186 End stage renal disease: Secondary | ICD-10-CM | POA: Diagnosis not present

## 2015-12-29 DIAGNOSIS — Z992 Dependence on renal dialysis: Secondary | ICD-10-CM | POA: Diagnosis not present

## 2015-12-31 DIAGNOSIS — Z992 Dependence on renal dialysis: Secondary | ICD-10-CM | POA: Diagnosis not present

## 2015-12-31 DIAGNOSIS — I482 Chronic atrial fibrillation: Secondary | ICD-10-CM | POA: Diagnosis not present

## 2015-12-31 DIAGNOSIS — D631 Anemia in chronic kidney disease: Secondary | ICD-10-CM | POA: Diagnosis not present

## 2015-12-31 DIAGNOSIS — D509 Iron deficiency anemia, unspecified: Secondary | ICD-10-CM | POA: Diagnosis not present

## 2015-12-31 DIAGNOSIS — N2581 Secondary hyperparathyroidism of renal origin: Secondary | ICD-10-CM | POA: Diagnosis not present

## 2015-12-31 DIAGNOSIS — N186 End stage renal disease: Secondary | ICD-10-CM | POA: Diagnosis not present

## 2016-01-01 ENCOUNTER — Ambulatory Visit (INDEPENDENT_AMBULATORY_CARE_PROVIDER_SITE_OTHER): Payer: Medicare Other | Admitting: Pharmacist

## 2016-01-01 DIAGNOSIS — I4891 Unspecified atrial fibrillation: Secondary | ICD-10-CM | POA: Diagnosis not present

## 2016-01-01 LAB — POCT INR: INR: 1.5

## 2016-01-02 DIAGNOSIS — I129 Hypertensive chronic kidney disease with stage 1 through stage 4 chronic kidney disease, or unspecified chronic kidney disease: Secondary | ICD-10-CM | POA: Diagnosis not present

## 2016-01-02 DIAGNOSIS — N186 End stage renal disease: Secondary | ICD-10-CM | POA: Diagnosis not present

## 2016-01-02 DIAGNOSIS — D631 Anemia in chronic kidney disease: Secondary | ICD-10-CM | POA: Diagnosis not present

## 2016-01-02 DIAGNOSIS — N2581 Secondary hyperparathyroidism of renal origin: Secondary | ICD-10-CM | POA: Diagnosis not present

## 2016-01-02 DIAGNOSIS — D509 Iron deficiency anemia, unspecified: Secondary | ICD-10-CM | POA: Diagnosis not present

## 2016-01-02 DIAGNOSIS — Z992 Dependence on renal dialysis: Secondary | ICD-10-CM | POA: Diagnosis not present

## 2016-01-05 DIAGNOSIS — N186 End stage renal disease: Secondary | ICD-10-CM | POA: Diagnosis not present

## 2016-01-05 DIAGNOSIS — D509 Iron deficiency anemia, unspecified: Secondary | ICD-10-CM | POA: Diagnosis not present

## 2016-01-05 DIAGNOSIS — N2581 Secondary hyperparathyroidism of renal origin: Secondary | ICD-10-CM | POA: Diagnosis not present

## 2016-01-05 DIAGNOSIS — D631 Anemia in chronic kidney disease: Secondary | ICD-10-CM | POA: Diagnosis not present

## 2016-01-07 DIAGNOSIS — N2581 Secondary hyperparathyroidism of renal origin: Secondary | ICD-10-CM | POA: Diagnosis not present

## 2016-01-07 DIAGNOSIS — D631 Anemia in chronic kidney disease: Secondary | ICD-10-CM | POA: Diagnosis not present

## 2016-01-07 DIAGNOSIS — N186 End stage renal disease: Secondary | ICD-10-CM | POA: Diagnosis not present

## 2016-01-07 DIAGNOSIS — D509 Iron deficiency anemia, unspecified: Secondary | ICD-10-CM | POA: Diagnosis not present

## 2016-01-08 ENCOUNTER — Ambulatory Visit (INDEPENDENT_AMBULATORY_CARE_PROVIDER_SITE_OTHER): Payer: Medicare Other

## 2016-01-08 DIAGNOSIS — I4891 Unspecified atrial fibrillation: Secondary | ICD-10-CM | POA: Diagnosis not present

## 2016-01-08 LAB — POCT INR: INR: 2.2

## 2016-01-09 DIAGNOSIS — D631 Anemia in chronic kidney disease: Secondary | ICD-10-CM | POA: Diagnosis not present

## 2016-01-09 DIAGNOSIS — D509 Iron deficiency anemia, unspecified: Secondary | ICD-10-CM | POA: Diagnosis not present

## 2016-01-09 DIAGNOSIS — N186 End stage renal disease: Secondary | ICD-10-CM | POA: Diagnosis not present

## 2016-01-09 DIAGNOSIS — N2581 Secondary hyperparathyroidism of renal origin: Secondary | ICD-10-CM | POA: Diagnosis not present

## 2016-01-12 DIAGNOSIS — D631 Anemia in chronic kidney disease: Secondary | ICD-10-CM | POA: Diagnosis not present

## 2016-01-12 DIAGNOSIS — N186 End stage renal disease: Secondary | ICD-10-CM | POA: Diagnosis not present

## 2016-01-12 DIAGNOSIS — D509 Iron deficiency anemia, unspecified: Secondary | ICD-10-CM | POA: Diagnosis not present

## 2016-01-12 DIAGNOSIS — N2581 Secondary hyperparathyroidism of renal origin: Secondary | ICD-10-CM | POA: Diagnosis not present

## 2016-01-14 DIAGNOSIS — N2581 Secondary hyperparathyroidism of renal origin: Secondary | ICD-10-CM | POA: Diagnosis not present

## 2016-01-14 DIAGNOSIS — D509 Iron deficiency anemia, unspecified: Secondary | ICD-10-CM | POA: Diagnosis not present

## 2016-01-14 DIAGNOSIS — D631 Anemia in chronic kidney disease: Secondary | ICD-10-CM | POA: Diagnosis not present

## 2016-01-14 DIAGNOSIS — N186 End stage renal disease: Secondary | ICD-10-CM | POA: Diagnosis not present

## 2016-01-15 ENCOUNTER — Ambulatory Visit (INDEPENDENT_AMBULATORY_CARE_PROVIDER_SITE_OTHER): Payer: Medicare Other | Admitting: Pharmacist

## 2016-01-15 DIAGNOSIS — I4891 Unspecified atrial fibrillation: Secondary | ICD-10-CM

## 2016-01-15 LAB — POCT INR: INR: 1.7

## 2016-01-16 DIAGNOSIS — D509 Iron deficiency anemia, unspecified: Secondary | ICD-10-CM | POA: Diagnosis not present

## 2016-01-16 DIAGNOSIS — N186 End stage renal disease: Secondary | ICD-10-CM | POA: Diagnosis not present

## 2016-01-16 DIAGNOSIS — D631 Anemia in chronic kidney disease: Secondary | ICD-10-CM | POA: Diagnosis not present

## 2016-01-16 DIAGNOSIS — N2581 Secondary hyperparathyroidism of renal origin: Secondary | ICD-10-CM | POA: Diagnosis not present

## 2016-01-18 ENCOUNTER — Encounter: Payer: Self-pay | Admitting: Vascular Surgery

## 2016-01-19 DIAGNOSIS — N186 End stage renal disease: Secondary | ICD-10-CM | POA: Diagnosis not present

## 2016-01-19 DIAGNOSIS — D509 Iron deficiency anemia, unspecified: Secondary | ICD-10-CM | POA: Diagnosis not present

## 2016-01-19 DIAGNOSIS — N2581 Secondary hyperparathyroidism of renal origin: Secondary | ICD-10-CM | POA: Diagnosis not present

## 2016-01-19 DIAGNOSIS — D631 Anemia in chronic kidney disease: Secondary | ICD-10-CM | POA: Diagnosis not present

## 2016-01-21 DIAGNOSIS — N186 End stage renal disease: Secondary | ICD-10-CM | POA: Diagnosis not present

## 2016-01-21 DIAGNOSIS — N2581 Secondary hyperparathyroidism of renal origin: Secondary | ICD-10-CM | POA: Diagnosis not present

## 2016-01-21 DIAGNOSIS — D631 Anemia in chronic kidney disease: Secondary | ICD-10-CM | POA: Diagnosis not present

## 2016-01-21 DIAGNOSIS — D509 Iron deficiency anemia, unspecified: Secondary | ICD-10-CM | POA: Diagnosis not present

## 2016-01-21 NOTE — Progress Notes (Deleted)
    Postoperative Access Visit   History of Present Illness  Trini Christiansen is a 45 y.o. year old male who presents for postoperative follow-up for: L 1st BVT (Date: 12/09/15).  The patient's wounds are *** healed.  The patient notes *** steal symptoms.  The patient is *** able to complete their activities of daily living.  The patient's current symptoms are: ***.  For VQI Use Only  PRE-ADM LIVING: {VQI Pre-admission Living:20973}  AMB STATUS: {VQI Ambulatory Status:20974}  Physical Examination There were no vitals filed for this visit.  LUE: Incision is *** healed, skin feels ***, hand grip is ***/5, sensation in digits is *** intact, ***palpable thrill, bruit can *** be auscultated   Medical Decision Making  Kashawn Dirr is a 45 y.o. year old male who presents s/p L 1st BVT.   The patient's access is *** ready for use.  The patient's tunneled dialysis catheter can be removed after two successful cannulations and completed dialysis treatments.  Thank you for allowing [place] to participate in this patient's care.  [name], MD, FACS Vascular and Vein Specialists of [place] Office: [phone] Pager: [phone]

## 2016-01-22 ENCOUNTER — Encounter: Payer: Medicare Other | Admitting: Vascular Surgery

## 2016-01-23 DIAGNOSIS — D509 Iron deficiency anemia, unspecified: Secondary | ICD-10-CM | POA: Diagnosis not present

## 2016-01-23 DIAGNOSIS — D631 Anemia in chronic kidney disease: Secondary | ICD-10-CM | POA: Diagnosis not present

## 2016-01-23 DIAGNOSIS — N2581 Secondary hyperparathyroidism of renal origin: Secondary | ICD-10-CM | POA: Diagnosis not present

## 2016-01-23 DIAGNOSIS — N186 End stage renal disease: Secondary | ICD-10-CM | POA: Diagnosis not present

## 2016-01-26 DIAGNOSIS — D509 Iron deficiency anemia, unspecified: Secondary | ICD-10-CM | POA: Diagnosis not present

## 2016-01-26 DIAGNOSIS — D631 Anemia in chronic kidney disease: Secondary | ICD-10-CM | POA: Diagnosis not present

## 2016-01-26 DIAGNOSIS — N186 End stage renal disease: Secondary | ICD-10-CM | POA: Diagnosis not present

## 2016-01-26 DIAGNOSIS — N2581 Secondary hyperparathyroidism of renal origin: Secondary | ICD-10-CM | POA: Diagnosis not present

## 2016-01-28 DIAGNOSIS — D509 Iron deficiency anemia, unspecified: Secondary | ICD-10-CM | POA: Diagnosis not present

## 2016-01-28 DIAGNOSIS — D631 Anemia in chronic kidney disease: Secondary | ICD-10-CM | POA: Diagnosis not present

## 2016-01-28 DIAGNOSIS — I482 Chronic atrial fibrillation: Secondary | ICD-10-CM | POA: Diagnosis not present

## 2016-01-28 DIAGNOSIS — N2581 Secondary hyperparathyroidism of renal origin: Secondary | ICD-10-CM | POA: Diagnosis not present

## 2016-01-28 DIAGNOSIS — N186 End stage renal disease: Secondary | ICD-10-CM | POA: Diagnosis not present

## 2016-01-29 ENCOUNTER — Ambulatory Visit (INDEPENDENT_AMBULATORY_CARE_PROVIDER_SITE_OTHER): Payer: Medicare Other | Admitting: *Deleted

## 2016-01-29 DIAGNOSIS — I4891 Unspecified atrial fibrillation: Secondary | ICD-10-CM | POA: Diagnosis not present

## 2016-01-29 LAB — POCT INR: INR: 2.3

## 2016-01-30 DIAGNOSIS — D509 Iron deficiency anemia, unspecified: Secondary | ICD-10-CM | POA: Diagnosis not present

## 2016-01-30 DIAGNOSIS — D631 Anemia in chronic kidney disease: Secondary | ICD-10-CM | POA: Diagnosis not present

## 2016-01-30 DIAGNOSIS — N186 End stage renal disease: Secondary | ICD-10-CM | POA: Diagnosis not present

## 2016-01-30 DIAGNOSIS — N2581 Secondary hyperparathyroidism of renal origin: Secondary | ICD-10-CM | POA: Diagnosis not present

## 2016-02-02 DIAGNOSIS — I129 Hypertensive chronic kidney disease with stage 1 through stage 4 chronic kidney disease, or unspecified chronic kidney disease: Secondary | ICD-10-CM | POA: Diagnosis not present

## 2016-02-02 DIAGNOSIS — N186 End stage renal disease: Secondary | ICD-10-CM | POA: Diagnosis not present

## 2016-02-02 DIAGNOSIS — Z992 Dependence on renal dialysis: Secondary | ICD-10-CM | POA: Diagnosis not present

## 2016-02-02 DIAGNOSIS — N2581 Secondary hyperparathyroidism of renal origin: Secondary | ICD-10-CM | POA: Diagnosis not present

## 2016-02-02 DIAGNOSIS — D631 Anemia in chronic kidney disease: Secondary | ICD-10-CM | POA: Diagnosis not present

## 2016-02-02 DIAGNOSIS — D509 Iron deficiency anemia, unspecified: Secondary | ICD-10-CM | POA: Diagnosis not present

## 2016-02-04 DIAGNOSIS — D631 Anemia in chronic kidney disease: Secondary | ICD-10-CM | POA: Diagnosis not present

## 2016-02-04 DIAGNOSIS — N2581 Secondary hyperparathyroidism of renal origin: Secondary | ICD-10-CM | POA: Diagnosis not present

## 2016-02-04 DIAGNOSIS — Z992 Dependence on renal dialysis: Secondary | ICD-10-CM | POA: Diagnosis not present

## 2016-02-04 DIAGNOSIS — D509 Iron deficiency anemia, unspecified: Secondary | ICD-10-CM | POA: Diagnosis not present

## 2016-02-04 DIAGNOSIS — N186 End stage renal disease: Secondary | ICD-10-CM | POA: Diagnosis not present

## 2016-02-06 DIAGNOSIS — N186 End stage renal disease: Secondary | ICD-10-CM | POA: Diagnosis not present

## 2016-02-06 DIAGNOSIS — Z992 Dependence on renal dialysis: Secondary | ICD-10-CM | POA: Diagnosis not present

## 2016-02-06 DIAGNOSIS — N2581 Secondary hyperparathyroidism of renal origin: Secondary | ICD-10-CM | POA: Diagnosis not present

## 2016-02-06 DIAGNOSIS — D631 Anemia in chronic kidney disease: Secondary | ICD-10-CM | POA: Diagnosis not present

## 2016-02-06 DIAGNOSIS — D509 Iron deficiency anemia, unspecified: Secondary | ICD-10-CM | POA: Diagnosis not present

## 2016-02-11 DIAGNOSIS — D631 Anemia in chronic kidney disease: Secondary | ICD-10-CM | POA: Diagnosis not present

## 2016-02-11 DIAGNOSIS — N186 End stage renal disease: Secondary | ICD-10-CM | POA: Diagnosis not present

## 2016-02-11 DIAGNOSIS — N2581 Secondary hyperparathyroidism of renal origin: Secondary | ICD-10-CM | POA: Diagnosis not present

## 2016-02-11 DIAGNOSIS — Z992 Dependence on renal dialysis: Secondary | ICD-10-CM | POA: Diagnosis not present

## 2016-02-11 DIAGNOSIS — D509 Iron deficiency anemia, unspecified: Secondary | ICD-10-CM | POA: Diagnosis not present

## 2016-02-13 DIAGNOSIS — Z992 Dependence on renal dialysis: Secondary | ICD-10-CM | POA: Diagnosis not present

## 2016-02-13 DIAGNOSIS — D509 Iron deficiency anemia, unspecified: Secondary | ICD-10-CM | POA: Diagnosis not present

## 2016-02-13 DIAGNOSIS — D631 Anemia in chronic kidney disease: Secondary | ICD-10-CM | POA: Diagnosis not present

## 2016-02-13 DIAGNOSIS — N186 End stage renal disease: Secondary | ICD-10-CM | POA: Diagnosis not present

## 2016-02-13 DIAGNOSIS — N2581 Secondary hyperparathyroidism of renal origin: Secondary | ICD-10-CM | POA: Diagnosis not present

## 2016-02-15 ENCOUNTER — Other Ambulatory Visit: Payer: Self-pay

## 2016-02-15 ENCOUNTER — Ambulatory Visit (HOSPITAL_COMMUNITY): Payer: Medicare Other | Attending: Cardiology

## 2016-02-15 DIAGNOSIS — I10 Essential (primary) hypertension: Secondary | ICD-10-CM | POA: Insufficient documentation

## 2016-02-15 DIAGNOSIS — I480 Paroxysmal atrial fibrillation: Secondary | ICD-10-CM | POA: Insufficient documentation

## 2016-02-16 DIAGNOSIS — Z992 Dependence on renal dialysis: Secondary | ICD-10-CM | POA: Diagnosis not present

## 2016-02-16 DIAGNOSIS — D509 Iron deficiency anemia, unspecified: Secondary | ICD-10-CM | POA: Diagnosis not present

## 2016-02-16 DIAGNOSIS — D631 Anemia in chronic kidney disease: Secondary | ICD-10-CM | POA: Diagnosis not present

## 2016-02-16 DIAGNOSIS — N186 End stage renal disease: Secondary | ICD-10-CM | POA: Diagnosis not present

## 2016-02-16 DIAGNOSIS — N2581 Secondary hyperparathyroidism of renal origin: Secondary | ICD-10-CM | POA: Diagnosis not present

## 2016-02-17 ENCOUNTER — Encounter: Payer: Self-pay | Admitting: Vascular Surgery

## 2016-02-18 DIAGNOSIS — N186 End stage renal disease: Secondary | ICD-10-CM | POA: Diagnosis not present

## 2016-02-18 DIAGNOSIS — N2581 Secondary hyperparathyroidism of renal origin: Secondary | ICD-10-CM | POA: Diagnosis not present

## 2016-02-18 DIAGNOSIS — D509 Iron deficiency anemia, unspecified: Secondary | ICD-10-CM | POA: Diagnosis not present

## 2016-02-18 DIAGNOSIS — Z992 Dependence on renal dialysis: Secondary | ICD-10-CM | POA: Diagnosis not present

## 2016-02-18 DIAGNOSIS — D631 Anemia in chronic kidney disease: Secondary | ICD-10-CM | POA: Diagnosis not present

## 2016-02-18 NOTE — Progress Notes (Deleted)
    Postoperative Access Visit   History of Present Illness  Aldan Camey is a 45 y.o. year old male who presents for postoperative follow-up for: L 1st BVT (Date: 12/09/15).  The patient's wounds are *** healed.  The patient notes *** steal symptoms.  The patient is *** able to complete their activities of daily living.  The patient's current symptoms are: ***.  For VQI Use Only  PRE-ADM LIVING: {VQI Pre-admission Living:20973}  AMB STATUS: {VQI Ambulatory Status:20974}  Physical Examination There were no vitals filed for this visit.  LUE: Incision is *** healed, skin feels ***, hand grip is ***/5, sensation in digits is *** intact, ***palpable thrill, bruit can *** be auscultated   Medical Decision Making  Earlin Sweeden is a 45 y.o. year old male who presents s/p L 1st BVT.   The patient's access is *** ready for use.  The patient's tunneled dialysis catheter can be removed after two successful cannulations and completed dialysis treatments.  Thank you for allowing [place] to participate in this patient's care.  [name], MD, FACS Vascular and Vein Specialists of [place] Office: [phone] Pager: [phone]

## 2016-02-19 ENCOUNTER — Encounter: Payer: Medicare Other | Admitting: Vascular Surgery

## 2016-02-19 DIAGNOSIS — N186 End stage renal disease: Secondary | ICD-10-CM | POA: Insufficient documentation

## 2016-02-19 DIAGNOSIS — Z992 Dependence on renal dialysis: Secondary | ICD-10-CM | POA: Insufficient documentation

## 2016-02-20 DIAGNOSIS — N186 End stage renal disease: Secondary | ICD-10-CM | POA: Diagnosis not present

## 2016-02-20 DIAGNOSIS — D631 Anemia in chronic kidney disease: Secondary | ICD-10-CM | POA: Diagnosis not present

## 2016-02-20 DIAGNOSIS — Z992 Dependence on renal dialysis: Secondary | ICD-10-CM | POA: Diagnosis not present

## 2016-02-20 DIAGNOSIS — N2581 Secondary hyperparathyroidism of renal origin: Secondary | ICD-10-CM | POA: Diagnosis not present

## 2016-02-20 DIAGNOSIS — D509 Iron deficiency anemia, unspecified: Secondary | ICD-10-CM | POA: Diagnosis not present

## 2016-02-22 DIAGNOSIS — N2581 Secondary hyperparathyroidism of renal origin: Secondary | ICD-10-CM | POA: Diagnosis not present

## 2016-02-22 DIAGNOSIS — Z992 Dependence on renal dialysis: Secondary | ICD-10-CM | POA: Diagnosis not present

## 2016-02-22 DIAGNOSIS — N186 End stage renal disease: Secondary | ICD-10-CM | POA: Diagnosis not present

## 2016-02-22 DIAGNOSIS — D631 Anemia in chronic kidney disease: Secondary | ICD-10-CM | POA: Diagnosis not present

## 2016-02-22 DIAGNOSIS — D509 Iron deficiency anemia, unspecified: Secondary | ICD-10-CM | POA: Diagnosis not present

## 2016-02-24 ENCOUNTER — Encounter: Payer: Self-pay | Admitting: Family

## 2016-02-24 DIAGNOSIS — D509 Iron deficiency anemia, unspecified: Secondary | ICD-10-CM | POA: Diagnosis not present

## 2016-02-24 DIAGNOSIS — I482 Chronic atrial fibrillation: Secondary | ICD-10-CM | POA: Diagnosis not present

## 2016-02-24 DIAGNOSIS — D631 Anemia in chronic kidney disease: Secondary | ICD-10-CM | POA: Diagnosis not present

## 2016-02-24 DIAGNOSIS — N2581 Secondary hyperparathyroidism of renal origin: Secondary | ICD-10-CM | POA: Diagnosis not present

## 2016-02-24 DIAGNOSIS — N186 End stage renal disease: Secondary | ICD-10-CM | POA: Diagnosis not present

## 2016-02-24 DIAGNOSIS — Z992 Dependence on renal dialysis: Secondary | ICD-10-CM | POA: Diagnosis not present

## 2016-02-27 DIAGNOSIS — D631 Anemia in chronic kidney disease: Secondary | ICD-10-CM | POA: Diagnosis not present

## 2016-02-27 DIAGNOSIS — N186 End stage renal disease: Secondary | ICD-10-CM | POA: Diagnosis not present

## 2016-02-27 DIAGNOSIS — N2581 Secondary hyperparathyroidism of renal origin: Secondary | ICD-10-CM | POA: Diagnosis not present

## 2016-02-27 DIAGNOSIS — Z992 Dependence on renal dialysis: Secondary | ICD-10-CM | POA: Diagnosis not present

## 2016-02-27 DIAGNOSIS — D509 Iron deficiency anemia, unspecified: Secondary | ICD-10-CM | POA: Diagnosis not present

## 2016-03-01 DIAGNOSIS — N186 End stage renal disease: Secondary | ICD-10-CM | POA: Diagnosis not present

## 2016-03-01 DIAGNOSIS — D509 Iron deficiency anemia, unspecified: Secondary | ICD-10-CM | POA: Diagnosis not present

## 2016-03-01 DIAGNOSIS — D631 Anemia in chronic kidney disease: Secondary | ICD-10-CM | POA: Diagnosis not present

## 2016-03-01 DIAGNOSIS — Z992 Dependence on renal dialysis: Secondary | ICD-10-CM | POA: Diagnosis not present

## 2016-03-01 DIAGNOSIS — N2581 Secondary hyperparathyroidism of renal origin: Secondary | ICD-10-CM | POA: Diagnosis not present

## 2016-03-02 ENCOUNTER — Other Ambulatory Visit: Payer: Self-pay

## 2016-03-02 ENCOUNTER — Encounter: Payer: Self-pay | Admitting: Family

## 2016-03-02 ENCOUNTER — Ambulatory Visit (HOSPITAL_COMMUNITY)
Admission: RE | Admit: 2016-03-02 | Discharge: 2016-03-02 | Disposition: A | Discharge status: Home / Self Care | Payer: Medicare Other | Source: Ambulatory Visit | Attending: Vascular Surgery | Admitting: Vascular Surgery

## 2016-03-02 ENCOUNTER — Telehealth: Payer: Self-pay | Admitting: Pharmacist Clinician (PhC)/ Clinical Pharmacy Specialist

## 2016-03-02 ENCOUNTER — Ambulatory Visit (INDEPENDENT_AMBULATORY_CARE_PROVIDER_SITE_OTHER): Payer: Medicare Other | Admitting: Family

## 2016-03-02 VITALS — BP 124/70 | HR 95 | Temp 97.0°F | Resp 16 | Ht 74.0 in | Wt 202.0 lb

## 2016-03-02 DIAGNOSIS — T82898A Other specified complication of vascular prosthetic devices, implants and grafts, initial encounter: Secondary | ICD-10-CM

## 2016-03-02 DIAGNOSIS — I770 Arteriovenous fistula, acquired: Secondary | ICD-10-CM

## 2016-03-02 DIAGNOSIS — M79602 Pain in left arm: Secondary | ICD-10-CM

## 2016-03-02 DIAGNOSIS — N186 End stage renal disease: Secondary | ICD-10-CM

## 2016-03-02 DIAGNOSIS — Z992 Dependence on renal dialysis: Secondary | ICD-10-CM

## 2016-03-02 NOTE — Telephone Encounter (Signed)
Received call from Vascular and Vein, patient needs to hold warfarin x 5 days for upcoming procedure.    Reviewed patient chart, unfortunately not much history, as patient has moved here from [facility].  No indication in chart that he held warfarin prior to previous procedure and no indication that patient has suffered a previous stroke.  Vinnie Level at Vein and Vascular also notes that patient reports not doing lovenox bridging in the past.    Per office protocol, okay to hold warfarin x 5 days prior to procedure.  Last dose should be Nov 30.  Resume warfarin either day of or day after procedure at discretion of surgeon.  Tommy Medal PharmD CPP

## 2016-03-02 NOTE — Progress Notes (Signed)
    Postoperative Access Visit   History of Present Illness  Corey [name] is a 45 y.o. year old male who presents for 6 weeks postoperative follow-up for: left first stage basilic vein transposition (brachiobasilic arteriovenous fistula) placement on 12-09-15 by Dr. [name]. Large cubital vein draining into basilic vein: 4 mm.   Pt reports numbness in all fingers of left hand.   He dialyzes T-TH-S via right upper chest catheter.     For VQI Use Only  PRE-ADM LIVING: Home  AMB STATUS: Wheelchair    Active Ambulatory Problems    Diagnosis Date Noted  . Atrial fibrillation ([place]) [I48.91] 12/23/2015  . Chronic kidney disease (CKD), stage IV (severe) ([place]) 02/19/2016   Resolved Ambulatory Problems    Diagnosis Date Noted  . No Resolved Ambulatory Problems   Past Medical History:  Diagnosis Date  . A-fib ([place])   . Arthritis   . Blindness and low vision   . Dissection of aorta ([place]) 2004  . Dysrhythmia   . ESRD (end stage renal disease) ([place])   . Headache   . History of cardioversion 2014  . Hypertension   . Neuropathy ([place])   . Paralysis ([place])   . Pneumonia      Physical Examination Vitals:   03/02/16 0954  BP: 124/70  Pulse: 95  Resp: 16  Temp: 97 F (36.1 C)  SpO2: 96%  Weight: 202 lb (91.6 kg)  Height: 6\' 2"  (1.88 m)   Body mass index is 25.94 kg/m.   Left radial pulse is 2+ palpable, decreased sensation in all left fingers.   Left UE: Incision is well healed, skin feels warm and normal, hand grip is 5/5,  palpable thrill at left arm AV fistula.   Medical Decision Making  Corey [name] is a 45 y.o. year old male who presents s/p left first stage basilic vein transposition (brachiobasilic arteriovenous fistula) placement on 12-09-15.   Dr. [name] spoke with and examined pt. Steal study of left arm AV fistula to be performed now; results reviewed with Dr. [name]. Dr. [name] advised to schedule pt for second stage left arm AV fistula revision of anastomosis for  Wednesday 03-09-16 by Dr. [name].   Thank you for allowing [place] to participate in this patient's care.  Keynan Heffern, Sharmon Leyden, RN, MSN, FNP-C Vascular and Vein Specialists of [place] Office: [phone]  03/02/2016, 10:18 AM  Clinic MD: [name]

## 2016-03-03 DIAGNOSIS — D509 Iron deficiency anemia, unspecified: Secondary | ICD-10-CM | POA: Diagnosis not present

## 2016-03-03 DIAGNOSIS — D631 Anemia in chronic kidney disease: Secondary | ICD-10-CM | POA: Diagnosis not present

## 2016-03-03 DIAGNOSIS — N186 End stage renal disease: Secondary | ICD-10-CM | POA: Diagnosis not present

## 2016-03-03 DIAGNOSIS — N2581 Secondary hyperparathyroidism of renal origin: Secondary | ICD-10-CM | POA: Diagnosis not present

## 2016-03-03 DIAGNOSIS — Z992 Dependence on renal dialysis: Secondary | ICD-10-CM | POA: Diagnosis not present

## 2016-03-03 DIAGNOSIS — I129 Hypertensive chronic kidney disease with stage 1 through stage 4 chronic kidney disease, or unspecified chronic kidney disease: Secondary | ICD-10-CM | POA: Diagnosis not present

## 2016-03-04 DIAGNOSIS — T148XXA Other injury of unspecified body region, initial encounter: Secondary | ICD-10-CM

## 2016-03-04 HISTORY — DX: Other injury of unspecified body region, initial encounter: T14.8XXA

## 2016-03-05 DIAGNOSIS — N186 End stage renal disease: Secondary | ICD-10-CM | POA: Diagnosis not present

## 2016-03-05 DIAGNOSIS — N2581 Secondary hyperparathyroidism of renal origin: Secondary | ICD-10-CM | POA: Diagnosis not present

## 2016-03-05 DIAGNOSIS — Z992 Dependence on renal dialysis: Secondary | ICD-10-CM | POA: Diagnosis not present

## 2016-03-08 DIAGNOSIS — Z992 Dependence on renal dialysis: Secondary | ICD-10-CM | POA: Diagnosis not present

## 2016-03-08 DIAGNOSIS — N186 End stage renal disease: Secondary | ICD-10-CM | POA: Diagnosis not present

## 2016-03-08 DIAGNOSIS — N2581 Secondary hyperparathyroidism of renal origin: Secondary | ICD-10-CM | POA: Diagnosis not present

## 2016-03-09 ENCOUNTER — Ambulatory Visit (HOSPITAL_COMMUNITY): Payer: Medicare Other | Admitting: Anesthesiology

## 2016-03-09 ENCOUNTER — Ambulatory Visit (HOSPITAL_COMMUNITY)
Admission: RE | Admit: 2016-03-09 | Discharge: 2016-03-09 | Disposition: A | Discharge status: Home / Self Care | Payer: Medicare Other | Source: Ambulatory Visit | Attending: Vascular Surgery | Admitting: Vascular Surgery

## 2016-03-09 ENCOUNTER — Encounter (HOSPITAL_COMMUNITY): Payer: Self-pay | Admitting: *Deleted

## 2016-03-09 ENCOUNTER — Encounter (HOSPITAL_COMMUNITY)
Admission: RE | Disposition: A | Discharge status: Home / Self Care | Payer: Self-pay | Source: Ambulatory Visit | Attending: Vascular Surgery

## 2016-03-09 DIAGNOSIS — I4891 Unspecified atrial fibrillation: Secondary | ICD-10-CM | POA: Insufficient documentation

## 2016-03-09 DIAGNOSIS — N186 End stage renal disease: Secondary | ICD-10-CM | POA: Insufficient documentation

## 2016-03-09 DIAGNOSIS — G839 Paralytic syndrome, unspecified: Secondary | ICD-10-CM | POA: Diagnosis not present

## 2016-03-09 DIAGNOSIS — I739 Peripheral vascular disease, unspecified: Secondary | ICD-10-CM | POA: Insufficient documentation

## 2016-03-09 DIAGNOSIS — Z888 Allergy status to other drugs, medicaments and biological substances status: Secondary | ICD-10-CM | POA: Insufficient documentation

## 2016-03-09 DIAGNOSIS — Y813 Surgical instruments, materials and general- and plastic-surgery devices (including sutures) associated with adverse incidents: Secondary | ICD-10-CM | POA: Diagnosis not present

## 2016-03-09 DIAGNOSIS — Z7901 Long term (current) use of anticoagulants: Secondary | ICD-10-CM | POA: Diagnosis not present

## 2016-03-09 DIAGNOSIS — Z992 Dependence on renal dialysis: Secondary | ICD-10-CM | POA: Diagnosis not present

## 2016-03-09 DIAGNOSIS — T82898A Other specified complication of vascular prosthetic devices, implants and grafts, initial encounter: Secondary | ICD-10-CM | POA: Diagnosis not present

## 2016-03-09 DIAGNOSIS — I120 Hypertensive chronic kidney disease with stage 5 chronic kidney disease or end stage renal disease: Secondary | ICD-10-CM | POA: Diagnosis not present

## 2016-03-09 DIAGNOSIS — M1990 Unspecified osteoarthritis, unspecified site: Secondary | ICD-10-CM | POA: Insufficient documentation

## 2016-03-09 DIAGNOSIS — N185 Chronic kidney disease, stage 5: Secondary | ICD-10-CM | POA: Diagnosis not present

## 2016-03-09 DIAGNOSIS — G629 Polyneuropathy, unspecified: Secondary | ICD-10-CM | POA: Insufficient documentation

## 2016-03-09 HISTORY — PX: BASCILIC VEIN TRANSPOSITION: SHX5742

## 2016-03-09 LAB — POCT I-STAT 4, (NA,K, GLUC, HGB,HCT)
Glucose, Bld: 91 mg/dL (ref 65–99)
HEMATOCRIT: 41 % (ref 39.0–52.0)
HEMOGLOBIN: 13.9 g/dL (ref 13.0–17.0)
Potassium: 3.6 mmol/L (ref 3.5–5.1)
SODIUM: 140 mmol/L (ref 135–145)

## 2016-03-09 LAB — APTT: APTT: 35 s (ref 24–36)

## 2016-03-09 LAB — PROTIME-INR
INR: 1.22
Prothrombin Time: 15.5 seconds — ABNORMAL HIGH (ref 11.4–15.2)

## 2016-03-09 SURGERY — TRANSPOSITION, VEIN, BASILIC
Anesthesia: Monitor Anesthesia Care | Site: Arm Upper | Laterality: Left

## 2016-03-09 MED ORDER — LIDOCAINE HCL (PF) 1 % IJ SOLN
INTRAMUSCULAR | Status: AC
  Filled 2016-03-09: qty 30

## 2016-03-09 MED ORDER — OXYCODONE-ACETAMINOPHEN 5-325 MG PO TABS
ORAL_TABLET | ORAL | Status: AC
  Administered 2016-03-09: 1
  Filled 2016-03-09: qty 1

## 2016-03-09 MED ORDER — FENTANYL CITRATE (PF) 100 MCG/2ML IJ SOLN
INTRAMUSCULAR | Status: AC
  Filled 2016-03-09: qty 2

## 2016-03-09 MED ORDER — FENTANYL CITRATE (PF) 100 MCG/2ML IJ SOLN
25.0000 ug | INTRAMUSCULAR | Status: DC | PRN
  Administered 2016-03-09 (×4): 50 ug via INTRAVENOUS

## 2016-03-09 MED ORDER — HEMOSTATIC AGENTS (NO CHARGE) OPTIME
TOPICAL | Status: DC | PRN
  Administered 2016-03-09: 1 via TOPICAL

## 2016-03-09 MED ORDER — LABETALOL HCL 300 MG PO TABS
300.0000 mg | ORAL_TABLET | Freq: Once | ORAL | Status: AC
  Administered 2016-03-09: 300 mg via ORAL
  Filled 2016-03-09 (×2): qty 1

## 2016-03-09 MED ORDER — CEFUROXIME SODIUM 1.5 G IJ SOLR
1.5000 g | INTRAMUSCULAR | Status: AC
  Administered 2016-03-09: 1.5 g via INTRAVENOUS
  Filled 2016-03-09: qty 1.5

## 2016-03-09 MED ORDER — PROPOFOL 500 MG/50ML IV EMUL
INTRAVENOUS | Status: DC | PRN
  Administered 2016-03-09: 50 ug/kg/min via INTRAVENOUS

## 2016-03-09 MED ORDER — MIDAZOLAM HCL 5 MG/5ML IJ SOLN
INTRAMUSCULAR | Status: DC | PRN
  Administered 2016-03-09: 2 mg via INTRAVENOUS

## 2016-03-09 MED ORDER — OXYCODONE-ACETAMINOPHEN 5-325 MG PO TABS: 1.0000 | ORAL_TABLET | Freq: Four times a day (QID) | ORAL | 0 refills | Status: DC | PRN

## 2016-03-09 MED ORDER — ONDANSETRON HCL 4 MG/2ML IJ SOLN
INTRAMUSCULAR | Status: AC
  Filled 2016-03-09: qty 4

## 2016-03-09 MED ORDER — CHLORHEXIDINE GLUCONATE CLOTH 2 % EX PADS: 6.0000 | MEDICATED_PAD | Freq: Once | CUTANEOUS | Status: DC

## 2016-03-09 MED ORDER — SODIUM CHLORIDE 0.9 % IV SOLN
INTRAVENOUS | Status: DC
  Administered 2016-03-09: 11:00:00 via INTRAVENOUS
  Administered 2016-03-09: 20 mL/h via INTRAVENOUS

## 2016-03-09 MED ORDER — 0.9 % SODIUM CHLORIDE (POUR BTL) OPTIME
TOPICAL | Status: DC | PRN
  Administered 2016-03-09: 1000 mL

## 2016-03-09 MED ORDER — OXYCODONE-ACETAMINOPHEN 5-325 MG PO TABS
ORAL_TABLET | ORAL | Status: AC
  Filled 2016-03-09: qty 1

## 2016-03-09 MED ORDER — ONDANSETRON HCL 4 MG/2ML IJ SOLN
INTRAMUSCULAR | Status: DC | PRN
  Administered 2016-03-09: 4 mg via INTRAVENOUS

## 2016-03-09 MED ORDER — SODIUM CHLORIDE 0.9 % IV SOLN
INTRAVENOUS | Status: DC
Start: 2016-03-09 — End: 2016-03-09
  Administered 2016-03-09: 09:00:00 via INTRAVENOUS

## 2016-03-09 MED ORDER — SODIUM CHLORIDE 0.9 % IV SOLN
INTRAVENOUS | Status: DC | PRN
  Administered 2016-03-09: 250 mL via INTRAMUSCULAR

## 2016-03-09 MED ORDER — FENTANYL CITRATE (PF) 100 MCG/2ML IJ SOLN
INTRAMUSCULAR | Status: AC
  Administered 2016-03-09: 50 ug via INTRAVENOUS
  Filled 2016-03-09: qty 2

## 2016-03-09 MED ORDER — FENTANYL CITRATE (PF) 100 MCG/2ML IJ SOLN
INTRAMUSCULAR | Status: DC | PRN
  Administered 2016-03-09 (×2): 50 ug via INTRAVENOUS

## 2016-03-09 MED ORDER — PROPOFOL 10 MG/ML IV BOLUS
INTRAVENOUS | Status: DC | PRN
  Administered 2016-03-09: 20 mg via INTRAVENOUS

## 2016-03-09 MED ORDER — PHENYLEPHRINE HCL 10 MG/ML IJ SOLN
INTRAVENOUS | Status: DC | PRN
  Administered 2016-03-09: 10 ug/min via INTRAVENOUS

## 2016-03-09 MED ORDER — LIDOCAINE HCL (CARDIAC) 20 MG/ML IV SOLN
INTRAVENOUS | Status: DC | PRN
  Administered 2016-03-09: 60 mg via INTRATRACHEAL

## 2016-03-09 MED ORDER — LIDOCAINE HCL (PF) 1 % IJ SOLN
INTRAMUSCULAR | Status: DC | PRN
  Administered 2016-03-09: 60 mL

## 2016-03-09 MED ORDER — MIDAZOLAM HCL 2 MG/2ML IJ SOLN
INTRAMUSCULAR | Status: AC
  Filled 2016-03-09: qty 2

## 2016-03-09 MED ORDER — FENTANYL CITRATE (PF) 100 MCG/2ML IJ SOLN: 50.0000 ug | Freq: Once | INTRAMUSCULAR | Status: DC

## 2016-03-09 MED ORDER — OXYCODONE-ACETAMINOPHEN 5-325 MG PO TABS: 1.0000 | ORAL_TABLET | Freq: Once | ORAL | Status: DC

## 2016-03-09 MED ORDER — ANTICOAGULANT SODIUM CITRATE 4% (200MG/5ML) IV SOLN
5.0000 mL | Status: AC
  Administered 2016-03-09: 1.9 mL via INTRAVENOUS
  Filled 2016-03-09: qty 250

## 2016-03-09 SURGICAL SUPPLY — 43 items
ARMBAND PINK RESTRICT EXTREMIT (MISCELLANEOUS) ×3 IMPLANT
CANISTER SUCTION 2500CC (MISCELLANEOUS) ×3 IMPLANT
CLIP TI WIDE RED SMALL 6 (CLIP) ×3 IMPLANT
CORDS BIPOLAR (ELECTRODE) IMPLANT
COVER PROBE W GEL 5X96 (DRAPES) ×3 IMPLANT
DECANTER SPIKE VIAL GLASS SM (MISCELLANEOUS) ×3 IMPLANT
DERMABOND ADVANCED (GAUZE/BANDAGES/DRESSINGS)
DERMABOND ADVANCED .7 DNX12 (GAUZE/BANDAGES/DRESSINGS) IMPLANT
DRSG COVADERM 4X6 (GAUZE/BANDAGES/DRESSINGS) ×3 IMPLANT
DRSG COVADERM 4X8 (GAUZE/BANDAGES/DRESSINGS) ×3 IMPLANT
ELECT REM PT RETURN 9FT ADLT (ELECTROSURGICAL) ×3
ELECTRODE REM PT RTRN 9FT ADLT (ELECTROSURGICAL) ×1 IMPLANT
GAUZE SPONGE 4X4 12PLY STRL (GAUZE/BANDAGES/DRESSINGS) ×3 IMPLANT
GLOVE BIO SURGEON STRL SZ7 (GLOVE) ×3 IMPLANT
GLOVE BIOGEL PI IND STRL 7.0 (GLOVE) ×4 IMPLANT
GLOVE BIOGEL PI IND STRL 7.5 (GLOVE) ×1 IMPLANT
GLOVE BIOGEL PI INDICATOR 7.0 (GLOVE) ×8
GLOVE BIOGEL PI INDICATOR 7.5 (GLOVE) ×2
GLOVE ECLIPSE 6.5 STRL STRAW (GLOVE) ×6 IMPLANT
GLOVE SURG SS PI 7.0 STRL IVOR (GLOVE) ×9 IMPLANT
GOWN STRL REUS W/ TWL LRG LVL3 (GOWN DISPOSABLE) ×3 IMPLANT
GOWN STRL REUS W/ TWL XL LVL3 (GOWN DISPOSABLE) ×5 IMPLANT
GOWN STRL REUS W/TWL LRG LVL3 (GOWN DISPOSABLE) ×6
GOWN STRL REUS W/TWL XL LVL3 (GOWN DISPOSABLE) ×10
HEMOSTAT SPONGE AVITENE ULTRA (HEMOSTASIS) ×3 IMPLANT
KIT BASIN OR (CUSTOM PROCEDURE TRAY) ×3 IMPLANT
KIT ROOM TURNOVER OR (KITS) ×3 IMPLANT
NS IRRIG 1000ML POUR BTL (IV SOLUTION) ×3 IMPLANT
PACK CV ACCESS (CUSTOM PROCEDURE TRAY) ×3 IMPLANT
PAD ARMBOARD 7.5X6 YLW CONV (MISCELLANEOUS) ×6 IMPLANT
STAPLER VISISTAT 35W (STAPLE) ×6 IMPLANT
SUT MNCRL AB 4-0 PS2 18 (SUTURE) ×3 IMPLANT
SUT PROLENE 6 0 BV (SUTURE) ×9 IMPLANT
SUT PROLENE 7 0 BV 1 (SUTURE) IMPLANT
SUT SILK 2 0 SH (SUTURE) ×3 IMPLANT
SUT SILK 4 0 (SUTURE) ×2
SUT SILK 4-0 18XBRD TIE 12 (SUTURE) ×1 IMPLANT
SUT VIC AB 2-0 CT1 27 (SUTURE) ×2
SUT VIC AB 2-0 CT1 TAPERPNT 27 (SUTURE) ×1 IMPLANT
SUT VIC AB 3-0 SH 27 (SUTURE) ×6
SUT VIC AB 3-0 SH 27X BRD (SUTURE) ×3 IMPLANT
UNDERPAD 30X30 (UNDERPADS AND DIAPERS) ×3 IMPLANT
WATER STERILE IRR 1000ML POUR (IV SOLUTION) ×3 IMPLANT

## 2016-03-09 NOTE — Interval H&P Note (Signed)
Vascular and Vein Specialists of [place]  History and Physical Update  The patient was interviewed and re-examined.  The patient's previous History and Physical has been reviewed and is unchanged except for: interval L first stage BVT.  Since then pt has developed hemodynamically significant steal sx in L arm.  Pt is schedule for L 2nd stage BVT with revision of anastomosis vs ligation of fistula.   Risk, benefits, and alternatives to access surgery were discussed.    The patient is aware the risks include but are not limited to: bleeding, infection, steal syndrome, nerve damage, ischemic monomelic neuropathy, failure to mature, need for additional procedures, death and stroke.    The patient agrees to proceed forward with the procedure.   [name], MD Vascular and Vein Specialists of [place] Office: [phone] Pager: [phone]  03/09/2016, 8:32 AM

## 2016-03-09 NOTE — Op Note (Signed)
OPERATIVE NOTE   PROCEDURE: left second stage basilic vein transposition (brachiobasilic arteriovenous fistula) placement with revision of anastomosis  PRE-OPERATIVE DIAGNOSIS: end stage renal disease, mild left hand steal symptoms  POST-OPERATIVE DIAGNOSIS: same as above   SURGEON: [name], MD  ASSISTANT(S): RNFA  ANESTHESIA: local and MAC  ESTIMATED BLOOD LOSS: 50 cc  FINDING(S): 1.  Fistula 6-8 mm throughout with no external sclerotic changes 2.  Easily palpable thrill at end of case 3.  Palpable radial pulse at end of case  SPECIMEN(S):  none  INDICATIONS:   Corey [name] is a 45 y.o. male who presents with end stage renal disease s/p left first stage basilic vein transposition.  The post-operatively developed hemodynamically significant steal syndrome.  I recommended: left second stage basilic vein transposition with revision of the anastomosis to limit blood flow in the fistula.  The patient is aware the risks include but are not limited to: bleeding, infection, steal syndrome, nerve damage, ischemic monomelic neuropathy, failure to mature, and need for additional procedures.  The patient is aware of the risks of the procedure and elects to proceed forward.   DESCRIPTION: After full informed written consent was obtained from the patient, the patient was brought back to the operating room and placed supine upon the operating table.  Prior to induction, the patient received IV antibiotics.   After obtaining adequate anesthesia, the patient was then prepped and draped in the standard fashion for a left arm access procedure.  I turned my attention first to identifying the patient's brachiobasilic arteriovenous fistula.  Using SonoSite guidance, the location of this fistula was marked out on the skin.   At this point, I injected local anesthetic along the entirety of this fistula.  In total, I injected about 50 mL of 1% lidocaine without epinephrine, including the injection of  the subcutaneous tunnel for the transposition of the fistula.    I made three longitudinal incisions over the fistula from its arterial anastomosis up to its axillary extent.  I carefully dissected the fistula away from its adjacent nerves.  Eventually the entirety of this fistula was mobilized.  I was immediately adjacent to the anastomosis distally.  Based on the length of the vein, I felt that I was going to need to do the anastomosis ~2-3 cm proximally.  Subsequently, I had to connected the two distal incisions to gain this exposure.    I dissected out the brachial artery proximal to the prior anastomosis and placed vessel loops.  I marked the anterior orientation of the fistula.  I then injected 20 cc 1% lidocaine without epinephrine along the route of the subcutaneous tunnel for this fistula.  I dissected from the antecubital incision to the axillary extent with the curvilinear tunneler.  I tied off the distal fistula with two 2-0 silk ties.  I transected the fistula proximal to the two ties and then injected sterile saline in this fistula.  I sewed the end of the fistula to the inner cannula and delivered the fistula through the metal tunnel, taking care to maintain orientation of the vein.  I sharply released the vein and then removed the inner cannula and metal tunnel.    I reset my exposure of the brachial artery and then placed the artery under tension proximally and distally.  I made an arteriotomy with a 11-blade and extended it slightly to get a 3-3.5 mm arteriotomy.  I transected the distal fistula to get the appropriate length for this new anastomosis.  I sewed the vein to the artery in an end-to-side configuration with 6-0 Prolene, taking care to adjust for the size discrepancy.  I bled both ends of this artery well prior to completion of the anastomosis.  I noticed no clot in either end.  The valves in the fistula were competent so I go minimal venous backbleeding.  I completed this  anastomosis in the standard fashion.  I released all vessel loops and clamps.  I had to repair a few areas of the suture line with interrupted simple stitches of 6-0 Prolene. Immediately there was an excellent thrill in the transposed fistula along with a palpable radial pulse.  The deep subcutaneous tissue was inspected for bleeding.  Bleeding was controlled with electrocautery and placement of large pieces of Avitene.  I washed out the surgical site after waiting a few minutes, and there was no further bleeding.  The superficial subcutaneous tissue in both incisions was then reapproximated a double layer of 3-0 Vicryl.  The skin was then reapproximated with staples due to this patient's inability to externally rotate his arm.  The skin was then cleaned, dried, and dressed with a Coverderm.  The patient tolerated this procedure well.    COMPLICATIONS: none  CONDITION: stable  [name], MD, [facility] Vascular and Vein Specialists of [place] Office: [phone] Pager: [phone]  03/09/2016, 1:53 PM

## 2016-03-09 NOTE — Interval H&P Note (Signed)
Vascular and Vein Specialists of [place]  History and Physical Update  The patient was interviewed and re-examined.  The patient's previous History and Physical has been reviewed and is unchanged except for: interval development of hemodynamically significant steal syndrome.  Depending on the intraoperative findings, I recommend: ligation of the left first stage basilic vein transposition vs second stage transposition with revision of the anastomosis to limit steal symptoms.   Risk, benefits, and alternatives to access surgery were discussed.    The patient is aware the risks include but are not limited to: bleeding, infection, trhombosis, steal syndrome, nerve damage, ischemic monomelic neuropathy, failure to mature, need for additional procedures, death and stroke.    The patient agrees to proceed forward with the procedure.   [name], MD Vascular and Vein Specialists of [place] Office: [phone] Pager: [phone]  03/09/2016, 10:44 AM

## 2016-03-09 NOTE — Anesthesia Preprocedure Evaluation (Addendum)
Anesthesia Evaluation  Patient identified by MRN, date of birth, ID band Patient awake    Reviewed: Allergy & Precautions, NPO status , Patient's Chart, lab work & pertinent test results, reviewed documented beta blocker date and time   History of Anesthesia Complications Negative for: history of anesthetic complications  Airway Mallampati: III  TM Distance: >3 FB Neck ROM: Full    Dental  (+) Dental Advisory Given,    Pulmonary neg shortness of breath, neg sleep apnea, neg COPD, neg recent URI,    Pulmonary exam normal breath sounds clear to auscultation       Cardiovascular hypertension, Pt. on medications and Pt. on home beta blockers (-) angina+ Peripheral Vascular Disease  (-) Past MI and (-) Cardiac Stents + dysrhythmias (s/p cardioversion in 2014) Atrial Fibrillation  Rhythm:Regular Rate:Normal  H/o Type A aortic dissection in 2004 s/p extensive repair   Neuro/Psych  Headaches, neg Seizures Neuropathy, paralysis of BLE due to aortic dissection in 2004    GI/Hepatic negative GI ROS, Neg liver ROS,   Endo/Other  negative endocrine ROS  Renal/GU ESRF and DialysisRenal disease     Musculoskeletal  (+) Arthritis ,   Abdominal   Peds  Hematology negative hematology ROS (+)   Anesthesia Other Findings   Reproductive/Obstetrics                            Lab Results  Component Value Date   WBC 10.5 12/09/2015   HGB 13.9 03/09/2016   HCT 41.0 03/09/2016   MCV 95.0 12/09/2015   PLT 175 12/09/2015   Lab Results  Component Value Date   NA 140 03/09/2016   K 3.6 03/09/2016    Anesthesia Physical  Anesthesia Plan  ASA: III  Anesthesia Plan: MAC   Post-op Pain Management:    Induction: Intravenous  Airway Management Planned: Natural Airway and Simple Face Mask  Additional Equipment:   Intra-op Plan:   Post-operative Plan:   Informed Consent: I have reviewed the patients  History and Physical, chart, labs and discussed the procedure including the risks, benefits and alternatives for the proposed anesthesia with the patient or authorized representative who has indicated his/her understanding and acceptance.   Dental advisory given  Plan Discussed with: CRNA  Anesthesia Plan Comments:        Anesthesia Quick Evaluation

## 2016-03-09 NOTE — Anesthesia Postprocedure Evaluation (Addendum)
Anesthesia Post Note  Patient: Corey [name]  Procedure(s) Performed: Procedure(s) (LRB): SECOND STAGE BASILIC VEIN TRANSPOSITION WITH REVISION OF ANASTOMOSIS LEFT UPPER ARM (Left)  Patient location during evaluation: PACU Anesthesia Type: MAC Level of consciousness: awake and alert Pain management: pain level controlled Vital Signs Assessment: post-procedure vital signs reviewed and stable Respiratory status: spontaneous breathing and patient connected to nasal cannula oxygen Cardiovascular status: blood pressure returned to baseline and stable Postop Assessment: no signs of nausea or vomiting Anesthetic complications: no    Last Vitals:  Vitals:   03/09/16 1530 03/09/16 1547  BP:  (!) 98/56  Pulse: 76 68  Resp: 19 17  Temp:      Last Pain:  Vitals:   03/09/16 1500  TempSrc:   PainSc: 8                  Angelito Hopping

## 2016-03-09 NOTE — Transfer of Care (Signed)
Immediate Anesthesia Transfer of Care Note  Patient: Cam Dauphin  Procedure(s) Performed: Procedure(s): SECOND STAGE BASILIC VEIN TRANSPOSITION WITH REVISION OF ANASTOMOSIS LEFT UPPER ARM (Left)  Patient Location: PACU  Anesthesia Type:MAC  Level of Consciousness: awake, alert , oriented and sedated  Airway & Oxygen Therapy: Patient Spontanous Breathing and Patient connected to nasal cannula oxygen  Post-op Assessment: Report given to RN, Post -op Vital signs reviewed and stable and Patient moving all extremities X 4  Post vital signs: Reviewed  Last Vitals:  Vitals:   03/09/16 0756 03/09/16 1400  BP: (!) 145/72 (!) 91/34  Pulse: 79 74  Resp: 18 18  Temp: 36.6 C 36.1 C    Last Pain:  Vitals:   03/09/16 1400  TempSrc:   PainSc: 10-Worst pain ever      Patients Stated Pain Goal: 4 (32/20/25 4270)  Complications: No apparent anesthesia complications

## 2016-03-09 NOTE — H&P (View-Only) (Signed)
    Postoperative Access Visit   History of Present Illness  Corey [name] is a 45 y.o. year old male who presents for 6 weeks postoperative follow-up for: left first stage basilic vein transposition (brachiobasilic arteriovenous fistula) placement on 12-09-15 by Dr. [name]. Large cubital vein draining into basilic vein: 4 mm.   Pt reports numbness in all fingers of left hand.   He dialyzes T-TH-S via right upper chest catheter.     For VQI Use Only  PRE-ADM LIVING: Home  AMB STATUS: Wheelchair    Active Ambulatory Problems    Diagnosis Date Noted  . Atrial fibrillation ([place]) [I48.91] 12/23/2015  . Chronic kidney disease (CKD), stage IV (severe) ([place]) 02/19/2016   Resolved Ambulatory Problems    Diagnosis Date Noted  . No Resolved Ambulatory Problems   Past Medical History:  Diagnosis Date  . A-fib ([place])   . Arthritis   . Blindness and low vision   . Dissection of aorta ([place]) 2004  . Dysrhythmia   . ESRD (end stage renal disease) ([place])   . Headache   . History of cardioversion 2014  . Hypertension   . Neuropathy ([place])   . Paralysis ([place])   . Pneumonia      Physical Examination Vitals:   03/02/16 0954  BP: 124/70  Pulse: 95  Resp: 16  Temp: 97 F (36.1 C)  SpO2: 96%  Weight: 202 lb (91.6 kg)  Height: 6\' 2"  (1.88 m)   Body mass index is 25.94 kg/m.   Left radial pulse is 2+ palpable, decreased sensation in all left fingers.   Left UE: Incision is well healed, skin feels warm and normal, hand grip is 5/5,  palpable thrill at left arm AV fistula.   Medical Decision Making  Corey [name] is a 45 y.o. year old male who presents s/p left first stage basilic vein transposition (brachiobasilic arteriovenous fistula) placement on 12-09-15.   Dr. [name] spoke with and examined pt. Steal study of left arm AV fistula to be performed now; results reviewed with Dr. [name]. Dr. [name] advised to schedule pt for second stage left arm AV fistula revision of anastomosis for  Wednesday 03-09-16 by Dr. [name].   Thank you for allowing [place] to participate in this patient's care.  NICKEL, Sharmon Leyden, RN, MSN, FNP-C [facility] Office: [phone]  03/02/2016, 10:18 AM  Clinic MD: [name]

## 2016-03-10 ENCOUNTER — Telehealth: Payer: Self-pay | Admitting: Vascular Surgery

## 2016-03-10 ENCOUNTER — Encounter (HOSPITAL_COMMUNITY): Payer: Self-pay | Admitting: Vascular Surgery

## 2016-03-10 NOTE — Telephone Encounter (Signed)
-----   Message from Denman George, RN sent at 03/09/2016  5:21 PM EST ----- Regarding: needs 4 wk. f/u MD appt. w/ Dr. [name]   ----- Message ----- From: Conrad Pasco, MD Sent: 03/09/2016   2:09 PM To: Vvs Charge Pool  Corey [name] [id number] 12-20-1970  PROCEDURE: left second stage basilic vein transposition (brachiobasilic arteriovenous fistula) placement with revision of anastomosis  Asst: RNFA  Follow-up: 1.  Nurse follow up in 2 weeks for staple removal 2.  MD follow up in 4 weeks

## 2016-03-10 NOTE — Telephone Encounter (Signed)
Sched staple removal 03/23/16 at 10:15. sched MD 04/08/16 at 2:00. Spoke to pt to inform them of appt.

## 2016-03-15 DIAGNOSIS — Z992 Dependence on renal dialysis: Secondary | ICD-10-CM | POA: Diagnosis not present

## 2016-03-15 DIAGNOSIS — N2581 Secondary hyperparathyroidism of renal origin: Secondary | ICD-10-CM | POA: Diagnosis not present

## 2016-03-15 DIAGNOSIS — N186 End stage renal disease: Secondary | ICD-10-CM | POA: Diagnosis not present

## 2016-03-17 ENCOUNTER — Encounter: Payer: Self-pay | Admitting: Family

## 2016-03-17 DIAGNOSIS — Z992 Dependence on renal dialysis: Secondary | ICD-10-CM | POA: Diagnosis not present

## 2016-03-17 DIAGNOSIS — N186 End stage renal disease: Secondary | ICD-10-CM | POA: Diagnosis not present

## 2016-03-17 DIAGNOSIS — N2581 Secondary hyperparathyroidism of renal origin: Secondary | ICD-10-CM | POA: Diagnosis not present

## 2016-03-19 DIAGNOSIS — N186 End stage renal disease: Secondary | ICD-10-CM | POA: Diagnosis not present

## 2016-03-19 DIAGNOSIS — N2581 Secondary hyperparathyroidism of renal origin: Secondary | ICD-10-CM | POA: Diagnosis not present

## 2016-03-19 DIAGNOSIS — Z992 Dependence on renal dialysis: Secondary | ICD-10-CM | POA: Diagnosis not present

## 2016-03-22 DIAGNOSIS — N186 End stage renal disease: Secondary | ICD-10-CM | POA: Diagnosis not present

## 2016-03-22 DIAGNOSIS — N2581 Secondary hyperparathyroidism of renal origin: Secondary | ICD-10-CM | POA: Diagnosis not present

## 2016-03-22 DIAGNOSIS — Z992 Dependence on renal dialysis: Secondary | ICD-10-CM | POA: Diagnosis not present

## 2016-03-23 ENCOUNTER — Ambulatory Visit (INDEPENDENT_AMBULATORY_CARE_PROVIDER_SITE_OTHER): Payer: Self-pay | Admitting: Family

## 2016-03-23 ENCOUNTER — Encounter: Payer: Self-pay | Admitting: Family

## 2016-03-23 VITALS — BP 138/88 | HR 79 | Temp 99.0°F | Resp 16 | Ht 74.0 in | Wt 199.0 lb

## 2016-03-23 DIAGNOSIS — I96 Gangrene, not elsewhere classified: Secondary | ICD-10-CM

## 2016-03-23 DIAGNOSIS — Z992 Dependence on renal dialysis: Secondary | ICD-10-CM

## 2016-03-23 DIAGNOSIS — L089 Local infection of the skin and subcutaneous tissue, unspecified: Secondary | ICD-10-CM

## 2016-03-23 DIAGNOSIS — T82898A Other specified complication of vascular prosthetic devices, implants and grafts, initial encounter: Secondary | ICD-10-CM

## 2016-03-23 DIAGNOSIS — I770 Arteriovenous fistula, acquired: Secondary | ICD-10-CM

## 2016-03-23 DIAGNOSIS — N186 End stage renal disease: Secondary | ICD-10-CM

## 2016-03-23 NOTE — Progress Notes (Signed)
Postoperative Access Visit   History of Present Illness  Corey [name] is a 45 y.o. year old male who is s/p left second stage basilic vein transposition (brachiobasilic arteriovenous fistula) placement with revision of anastomosis on 03-09-16 by Dr. [name]. He returns today for staples removal.   He dialyzes T-TH-S via right upper chest catheter.  He reports mild to moderate steal symptoms in left hand/forearm.  He denies fever or chills.  Left upper arm incision edges are well proximated, staples in place.   The patient is able to complete their activities of daily living.    For VQI Use Only  PRE-ADM LIVING: Home  AMB STATUS: Wheelchair   Past Medical History:  Diagnosis Date  . A-fib ([place])   . Arthritis    hands and shoulders  . Blindness and low vision   . Dissection of aorta ([place]) 2004  . Dysrhythmia    A-fib  . ESRD (end stage renal disease) ([place])   . Headache   . History of cardioversion 2014  . Hypertension   . Neuropathy ([place])   . Paralysis ([place])    due to dissection of aorta in 2004, lower extremities  . Pneumonia      Current Outpatient Prescriptions on File Prior to Visit  Medication Sig Dispense Refill  . cinacalcet (SENSIPAR) 60 MG tablet Take 120 mg by mouth daily.     [name] gabapentin (NEURONTIN) 100 MG capsule Take 100 mg by mouth daily.    [name] labetalol (NORMODYNE) 300 MG tablet Take 300 mg by mouth 2 (two) times daily.    [name] oxyCODONE-acetaminophen (PERCOCET/ROXICET) 5-325 MG tablet Take 1 tablet by mouth every 6 (six) hours as needed. 15 tablet 0  . sevelamer carbonate (RENVELA) 800 MG tablet Take 800 mg by mouth 3 (three) times daily with meals.    . warfarin (COUMADIN) 7.5 MG tablet Take 7.5 mg by mouth daily.     No current facility-administered medications on file prior to visit.      Physical Examination Vitals:   03/23/16 1005  BP: 138/88  Pulse: 79  Resp: 16  Temp: 99 F (37.2 C)  TempSrc: Oral  SpO2: 95%  Weight: 199 lb (90.3 kg)    Height: 6\' 2"  (1.88 m)   Body mass index is 25.55 kg/m.  Left upper arm incision is healing with no erythema, no swelling, skin feels warm and normal, hand grip is 4/5, sensation in digits is intact, but slightly more numb than the right fingers, palpable thrill.  When Tegaderm dressing was removed, 3 small areas of epidermis slouphed off, stuck to Tegaderm; skin is thin and friable in some areas adjacent to incision, with seepage of serous dependent fluid collection.  Pt denies taking any prednisone like medications.  He does take coumadin, his past medical history includes atrial fib.  Dr. [name] spoke with and examined pt.   Medical Decision Making  Weston Kallman is a 45 y.o. year old male who presents s/p left second stage basilic vein transposition (brachiobasilic arteriovenous fistula) placement with revision of anastomosis on 03-09-16 by Dr. [name].  All staples removed. Areas of sloughed skin cleansed with wound cleanser, xeroform dressing applied. Arrange for [facility] to perform daily cleansing and xeroform dressing changes to areas of sloughed skin adjacent to incision. HH was contacted and replied that pt would be visited tomorrow.   Follow up on 04-08-16 at 2 pm with PA for 4 weeks follow up, already scheduled.   Thank you for allowing  [place] to participate in this patient's care.  NICKEL, Sharmon Leyden, RN, MSN, FNP-C [facility] [place] Office: [phone]  03/23/2016, 10:24 AM  Clinic MD: [name]

## 2016-03-24 ENCOUNTER — Encounter: Payer: Medicare Other | Admitting: Family

## 2016-03-24 DIAGNOSIS — N186 End stage renal disease: Secondary | ICD-10-CM | POA: Diagnosis not present

## 2016-03-24 DIAGNOSIS — N2581 Secondary hyperparathyroidism of renal origin: Secondary | ICD-10-CM | POA: Diagnosis not present

## 2016-03-24 DIAGNOSIS — Z992 Dependence on renal dialysis: Secondary | ICD-10-CM | POA: Diagnosis not present

## 2016-03-25 ENCOUNTER — Encounter: Payer: Self-pay | Admitting: Family

## 2016-03-29 DIAGNOSIS — N186 End stage renal disease: Secondary | ICD-10-CM | POA: Diagnosis not present

## 2016-03-29 DIAGNOSIS — Z992 Dependence on renal dialysis: Secondary | ICD-10-CM | POA: Diagnosis not present

## 2016-03-29 DIAGNOSIS — N2581 Secondary hyperparathyroidism of renal origin: Secondary | ICD-10-CM | POA: Diagnosis not present

## 2016-03-31 DIAGNOSIS — I482 Chronic atrial fibrillation: Secondary | ICD-10-CM | POA: Diagnosis not present

## 2016-03-31 DIAGNOSIS — N2581 Secondary hyperparathyroidism of renal origin: Secondary | ICD-10-CM | POA: Diagnosis not present

## 2016-03-31 DIAGNOSIS — Z992 Dependence on renal dialysis: Secondary | ICD-10-CM | POA: Diagnosis not present

## 2016-03-31 DIAGNOSIS — N186 End stage renal disease: Secondary | ICD-10-CM | POA: Diagnosis not present

## 2016-04-02 DIAGNOSIS — N186 End stage renal disease: Secondary | ICD-10-CM | POA: Diagnosis not present

## 2016-04-02 DIAGNOSIS — N2581 Secondary hyperparathyroidism of renal origin: Secondary | ICD-10-CM | POA: Diagnosis not present

## 2016-04-02 DIAGNOSIS — Z992 Dependence on renal dialysis: Secondary | ICD-10-CM | POA: Diagnosis not present

## 2016-04-03 DIAGNOSIS — I129 Hypertensive chronic kidney disease with stage 1 through stage 4 chronic kidney disease, or unspecified chronic kidney disease: Secondary | ICD-10-CM | POA: Diagnosis not present

## 2016-04-03 DIAGNOSIS — N186 End stage renal disease: Secondary | ICD-10-CM | POA: Diagnosis not present

## 2016-04-03 DIAGNOSIS — Z992 Dependence on renal dialysis: Secondary | ICD-10-CM | POA: Diagnosis not present

## 2016-04-07 DIAGNOSIS — N186 End stage renal disease: Secondary | ICD-10-CM | POA: Diagnosis not present

## 2016-04-07 DIAGNOSIS — D509 Iron deficiency anemia, unspecified: Secondary | ICD-10-CM | POA: Diagnosis not present

## 2016-04-07 DIAGNOSIS — Z992 Dependence on renal dialysis: Secondary | ICD-10-CM | POA: Diagnosis not present

## 2016-04-07 DIAGNOSIS — N2581 Secondary hyperparathyroidism of renal origin: Secondary | ICD-10-CM | POA: Diagnosis not present

## 2016-04-07 DIAGNOSIS — D631 Anemia in chronic kidney disease: Secondary | ICD-10-CM | POA: Diagnosis not present

## 2016-04-08 ENCOUNTER — Encounter: Payer: Self-pay | Admitting: Vascular Surgery

## 2016-04-08 ENCOUNTER — Other Ambulatory Visit: Payer: Self-pay

## 2016-04-08 ENCOUNTER — Telehealth: Payer: Self-pay | Admitting: Cardiology

## 2016-04-08 ENCOUNTER — Ambulatory Visit (INDEPENDENT_AMBULATORY_CARE_PROVIDER_SITE_OTHER): Payer: Medicare Other | Admitting: Vascular Surgery

## 2016-04-08 VITALS — BP 132/86 | HR 81 | Temp 98.3°F | Resp 16 | Ht 74.0 in | Wt 192.9 lb

## 2016-04-08 DIAGNOSIS — T814XXA Infection following a procedure, initial encounter: Secondary | ICD-10-CM

## 2016-04-08 DIAGNOSIS — IMO0001 Reserved for inherently not codable concepts without codable children: Secondary | ICD-10-CM

## 2016-04-08 MED ORDER — OXYCODONE-ACETAMINOPHEN 5-325 MG PO TABS: 1.0000 | ORAL_TABLET | Freq: Four times a day (QID) | ORAL | 0 refills | Status: DC | PRN

## 2016-04-08 NOTE — Telephone Encounter (Signed)
New Message    Request for surgical clearance:  1. What type of surgery is being performed? debreavement of left upper arm   2. When is this surgery scheduled? 04/13/16  3. Are there any medications that need to be held prior to surgery and how long? Coumadin , last dose today   4. Name of physician performing surgery? Dr [name]  5. What is your office phone and fax number? [phone] fax [phone]

## 2016-04-08 NOTE — Progress Notes (Signed)
Patient name: Corey [name] MRN: [id number] DOB: November 28, 1970 Sex: male  REASON FOR VISIT: post-op   HPI: Corey [name] is a 46 y.o. male who presents for follow-up status post left second stage basilic vein transposition with revision of anastomosis. His fistula was originally created on 12/09/2015 by Dr. [name]. The patient did report numbness in all of the fingers of his left hand following the initial procedure. He was last seen in our office on 03/23/2016 for staple removal. He had some areas of sloughed skin adjacent to his distal incision. Home health was contacted for daily dressing changes.  At that time, he reported mild to moderate symptoms in his left hand and forearm.   The patient is using a right IJ catheter for dialysis.  Today, he complains of worsening numbness to all of his left fingertips and left palm. He also feels that his left arm is weak. He also says that the wounds of his left upper arm have worsened. Home health never came to his home. He denies any drainage or foul odor. He notes that dead skin keeps coming off when he takes the dressing off. He denies any fever or chills. He has been putting Neosporin on his wound at home.  He is on Coumadin for atrial fibrillation.  Current Outpatient Prescriptions  Medication Sig Dispense Refill  . cinacalcet (SENSIPAR) 60 MG tablet Take 120 mg by mouth daily.     [name] gabapentin (NEURONTIN) 100 MG capsule Take 100 mg by mouth daily.    [name] labetalol (NORMODYNE) 300 MG tablet Take 300 mg by mouth 2 (two) times daily.    [name] oxyCODONE-acetaminophen (PERCOCET/ROXICET) 5-325 MG tablet Take 1 tablet by mouth every 6 (six) hours as needed. 15 tablet 0  . sevelamer carbonate (RENVELA) 800 MG tablet Take 800 mg by mouth 3 (three) times daily with meals.    . warfarin (COUMADIN) 7.5 MG tablet Take 7.5 mg by mouth daily.     No current facility-administered medications for this visit.     REVIEW OF SYSTEMS:  [X]  denotes positive finding, [ ]  denotes  negative finding Cardiac  Comments:  Chest pain or chest pressure:    Shortness of breath upon exertion:    Short of breath when lying flat:    Irregular heart rhythm:    Constitutional    Fever or chills:      PHYSICAL EXAM: Vitals:   04/08/16 1353  BP: 132/86  Pulse: 81  Resp: 16  Temp: 98.3 F (36.8 C)  TempSrc: Oral  SpO2: 93%  Weight: 192 lb 14.4 oz (87.5 kg)  Height: 6\' 2"  (1.88 m)    GENERAL: The patient is a well-nourished male, in no acute distress. The vital signs are documented above. VASCULAR: Right IJ TDC. Palpable thrill left upper arm fistula. Left distal incision is open with visible vicryl sutures. Large area of necrosis surrounding the incision. No drainage or foul odor noted. Non palpable left radial pulse. Left hand is warm with some sensation intact, but decreased. Left grip intact 4/5.   MEDICAL ISSUES: Status post left second stage basilic vein transposition with revision of anastomosis Post-operative wound infection  Large area of skin necrosis and wound dehiscence at distal arm incision. Dr. [name] also evaluated patient. The patient had some skin sloughing at his appointment on 03/23/2016. Home health was contacted, but never came to the patient's home. Need debridement of his left upper arm wound. Discussed that if his fistula becomes exposed intra-op, will need ligation.  He will be set up for debridement of left upper arm wound with possible ligation of left upper arm fistula on 04/13/2016 with Dr. [name]. He will hold his Coumadin until then. The patient may need to be admitted following his surgery to have home health wound care set up.  He was given prescription for pain. Percocet 5/325 #15.  Virgina Jock, PA-C [facility] MD: [name]

## 2016-04-09 DIAGNOSIS — D509 Iron deficiency anemia, unspecified: Secondary | ICD-10-CM | POA: Diagnosis not present

## 2016-04-09 DIAGNOSIS — N2581 Secondary hyperparathyroidism of renal origin: Secondary | ICD-10-CM | POA: Diagnosis not present

## 2016-04-09 DIAGNOSIS — N186 End stage renal disease: Secondary | ICD-10-CM | POA: Diagnosis not present

## 2016-04-09 DIAGNOSIS — Z992 Dependence on renal dialysis: Secondary | ICD-10-CM | POA: Diagnosis not present

## 2016-04-09 DIAGNOSIS — D631 Anemia in chronic kidney disease: Secondary | ICD-10-CM | POA: Diagnosis not present

## 2016-04-11 NOTE — Telephone Encounter (Signed)
Pt takes Coumadin for afib with CHADS2 score of 1 (HTN). Ok to hold Coumadin for 5 days prior to procedure. Of note, do not have recent INR reading, pt is almost 3 months overdue for a check. Called pt to schedule for an INR check 1 week after procedure. He stated that he took his last dose of Coumadin on Friday.   Note to Dr Lianne Moris office - please contact us sooner than Friday at 5pm the week prior to procedure. We were unable to provide clearance until today, which would have been problematic if patient required a Lovenox bridge since he was already instructed by your office to hold Coumadin prior to receiving clearance.  Clearance has been faxed to [phone].

## 2016-04-11 NOTE — Telephone Encounter (Signed)
Agree with notes. Candee Furbish, MD

## 2016-04-12 DIAGNOSIS — N2581 Secondary hyperparathyroidism of renal origin: Secondary | ICD-10-CM | POA: Diagnosis not present

## 2016-04-12 DIAGNOSIS — D509 Iron deficiency anemia, unspecified: Secondary | ICD-10-CM | POA: Diagnosis not present

## 2016-04-12 DIAGNOSIS — Z992 Dependence on renal dialysis: Secondary | ICD-10-CM | POA: Diagnosis not present

## 2016-04-12 DIAGNOSIS — N186 End stage renal disease: Secondary | ICD-10-CM | POA: Diagnosis not present

## 2016-04-12 DIAGNOSIS — D631 Anemia in chronic kidney disease: Secondary | ICD-10-CM | POA: Diagnosis not present

## 2016-04-13 ENCOUNTER — Encounter (HOSPITAL_COMMUNITY)
Admission: RE | Disposition: A | Discharge status: Home Health | Payer: Self-pay | Source: Ambulatory Visit | Attending: Vascular Surgery

## 2016-04-13 ENCOUNTER — Encounter (HOSPITAL_COMMUNITY): Payer: Self-pay | Admitting: *Deleted

## 2016-04-13 ENCOUNTER — Ambulatory Visit (HOSPITAL_COMMUNITY): Payer: Medicare Other | Admitting: Certified Registered Nurse Anesthetist

## 2016-04-13 ENCOUNTER — Inpatient Hospital Stay (HOSPITAL_COMMUNITY)
Admission: RE | Admit: 2016-04-13 | Discharge: 2016-04-27 | DRG: 856 | Disposition: A | Discharge status: Home Health | Payer: Medicare Other | Source: Ambulatory Visit | Attending: Vascular Surgery | Admitting: Vascular Surgery

## 2016-04-13 DIAGNOSIS — T814XXD Infection following a procedure, subsequent encounter: Secondary | ICD-10-CM | POA: Diagnosis not present

## 2016-04-13 DIAGNOSIS — T8189XA Other complications of procedures, not elsewhere classified, initial encounter: Secondary | ICD-10-CM | POA: Diagnosis present

## 2016-04-13 DIAGNOSIS — N2581 Secondary hyperparathyroidism of renal origin: Secondary | ICD-10-CM | POA: Diagnosis present

## 2016-04-13 DIAGNOSIS — E43 Unspecified severe protein-calorie malnutrition: Secondary | ICD-10-CM | POA: Diagnosis present

## 2016-04-13 DIAGNOSIS — Y832 Surgical operation with anastomosis, bypass or graft as the cause of abnormal reaction of the patient, or of later complication, without mention of misadventure at the time of the procedure: Secondary | ICD-10-CM | POA: Diagnosis present

## 2016-04-13 DIAGNOSIS — Z888 Allergy status to other drugs, medicaments and biological substances status: Secondary | ICD-10-CM | POA: Diagnosis not present

## 2016-04-13 DIAGNOSIS — I120 Hypertensive chronic kidney disease with stage 5 chronic kidney disease or end stage renal disease: Secondary | ICD-10-CM | POA: Diagnosis present

## 2016-04-13 DIAGNOSIS — I4891 Unspecified atrial fibrillation: Secondary | ICD-10-CM | POA: Diagnosis present

## 2016-04-13 DIAGNOSIS — G839 Paralytic syndrome, unspecified: Secondary | ICD-10-CM | POA: Diagnosis present

## 2016-04-13 DIAGNOSIS — N186 End stage renal disease: Secondary | ICD-10-CM | POA: Diagnosis present

## 2016-04-13 DIAGNOSIS — Z7901 Long term (current) use of anticoagulants: Secondary | ICD-10-CM | POA: Diagnosis not present

## 2016-04-13 DIAGNOSIS — T45515A Adverse effect of anticoagulants, initial encounter: Secondary | ICD-10-CM | POA: Diagnosis not present

## 2016-04-13 DIAGNOSIS — H547 Unspecified visual loss: Secondary | ICD-10-CM | POA: Diagnosis present

## 2016-04-13 DIAGNOSIS — S51802A Unspecified open wound of left forearm, initial encounter: Secondary | ICD-10-CM | POA: Diagnosis not present

## 2016-04-13 DIAGNOSIS — R52 Pain, unspecified: Secondary | ICD-10-CM | POA: Diagnosis not present

## 2016-04-13 DIAGNOSIS — R030 Elevated blood-pressure reading, without diagnosis of hypertension: Secondary | ICD-10-CM | POA: Diagnosis not present

## 2016-04-13 DIAGNOSIS — Z79899 Other long term (current) drug therapy: Secondary | ICD-10-CM | POA: Diagnosis not present

## 2016-04-13 DIAGNOSIS — Z6830 Body mass index (BMI) 30.0-30.9, adult: Secondary | ICD-10-CM | POA: Diagnosis not present

## 2016-04-13 DIAGNOSIS — T8131XA Disruption of external operation (surgical) wound, not elsewhere classified, initial encounter: Secondary | ICD-10-CM | POA: Diagnosis present

## 2016-04-13 DIAGNOSIS — L7622 Postprocedural hemorrhage and hematoma of skin and subcutaneous tissue following other procedure: Secondary | ICD-10-CM | POA: Diagnosis not present

## 2016-04-13 DIAGNOSIS — D631 Anemia in chronic kidney disease: Secondary | ICD-10-CM | POA: Diagnosis present

## 2016-04-13 DIAGNOSIS — G629 Polyneuropathy, unspecified: Secondary | ICD-10-CM | POA: Diagnosis present

## 2016-04-13 DIAGNOSIS — G8220 Paraplegia, unspecified: Secondary | ICD-10-CM | POA: Diagnosis not present

## 2016-04-13 DIAGNOSIS — T82898A Other specified complication of vascular prosthetic devices, implants and grafts, initial encounter: Secondary | ICD-10-CM | POA: Diagnosis present

## 2016-04-13 DIAGNOSIS — Z992 Dependence on renal dialysis: Secondary | ICD-10-CM

## 2016-04-13 DIAGNOSIS — R200 Anesthesia of skin: Secondary | ICD-10-CM | POA: Diagnosis not present

## 2016-04-13 DIAGNOSIS — I129 Hypertensive chronic kidney disease with stage 1 through stage 4 chronic kidney disease, or unspecified chronic kidney disease: Secondary | ICD-10-CM | POA: Diagnosis not present

## 2016-04-13 DIAGNOSIS — L98499 Non-pressure chronic ulcer of skin of other sites with unspecified severity: Secondary | ICD-10-CM | POA: Diagnosis not present

## 2016-04-13 DIAGNOSIS — T814XXA Infection following a procedure, initial encounter: Secondary | ICD-10-CM | POA: Diagnosis present

## 2016-04-13 HISTORY — PX: WOUND DEBRIDEMENT: SHX247

## 2016-04-13 HISTORY — DX: Other injury of unspecified body region, initial encounter: T14.8XXA

## 2016-04-13 HISTORY — PX: APPLICATION OF WOUND VAC: SHX5189

## 2016-04-13 LAB — CBC
HEMATOCRIT: 33.3 % — AB (ref 39.0–52.0)
Hemoglobin: 10.3 g/dL — ABNORMAL LOW (ref 13.0–17.0)
MCH: 30.1 pg (ref 26.0–34.0)
MCHC: 30.9 g/dL (ref 30.0–36.0)
MCV: 97.4 fL (ref 78.0–100.0)
PLATELETS: 176 10*3/uL (ref 150–400)
RBC: 3.42 MIL/uL — AB (ref 4.22–5.81)
RDW: 16.3 % — AB (ref 11.5–15.5)
WBC: 6 10*3/uL (ref 4.0–10.5)

## 2016-04-13 LAB — APTT: aPTT: 71 seconds — ABNORMAL HIGH (ref 24–36)

## 2016-04-13 LAB — PROTIME-INR
INR: 2.32
INR: 2.17
Prothrombin Time: 25.8 seconds — ABNORMAL HIGH (ref 11.4–15.2)
Prothrombin Time: 24.5 seconds — ABNORMAL HIGH (ref 11.4–15.2)

## 2016-04-13 LAB — POCT I-STAT 4, (NA,K, GLUC, HGB,HCT)
GLUCOSE: 97 mg/dL (ref 65–99)
HCT: 32 % — ABNORMAL LOW (ref 39.0–52.0)
Hemoglobin: 10.9 g/dL — ABNORMAL LOW (ref 13.0–17.0)
POTASSIUM: 3.4 mmol/L — AB (ref 3.5–5.1)
SODIUM: 137 mmol/L (ref 135–145)

## 2016-04-13 SURGERY — DEBRIDEMENT, WOUND
Anesthesia: General | Site: Arm Upper | Laterality: Left

## 2016-04-13 MED ORDER — ALUM & MAG HYDROXIDE-SIMETH 200-200-20 MG/5ML PO SUSP: 15.0000 mL | ORAL | Status: DC | PRN

## 2016-04-13 MED ORDER — SODIUM CHLORIDE 0.9% FLUSH
3.0000 mL | Freq: Two times a day (BID) | INTRAVENOUS | Status: DC
  Administered 2016-04-15 – 2016-04-26 (×14): 3 mL via INTRAVENOUS

## 2016-04-13 MED ORDER — GABAPENTIN 100 MG PO CAPS
100.0000 mg | ORAL_CAPSULE | Freq: Every day | ORAL | Status: DC
  Administered 2016-04-13 – 2016-04-23 (×10): 100 mg via ORAL
  Filled 2016-04-13 (×11): qty 1

## 2016-04-13 MED ORDER — MORPHINE SULFATE (PF) 4 MG/ML IV SOLN
INTRAVENOUS | Status: AC
  Filled 2016-04-13: qty 1

## 2016-04-13 MED ORDER — FENTANYL CITRATE (PF) 100 MCG/2ML IJ SOLN
INTRAMUSCULAR | Status: AC
  Filled 2016-04-13: qty 2

## 2016-04-13 MED ORDER — METOPROLOL TARTRATE 5 MG/5ML IV SOLN: 2.0000 mg | INTRAVENOUS | Status: DC | PRN

## 2016-04-13 MED ORDER — ACETAMINOPHEN 325 MG RE SUPP: 325.0000 mg | RECTAL | Status: DC | PRN

## 2016-04-13 MED ORDER — OXYCODONE-ACETAMINOPHEN 5-325 MG PO TABS
ORAL_TABLET | ORAL | Status: AC
  Filled 2016-04-13: qty 2

## 2016-04-13 MED ORDER — PANTOPRAZOLE SODIUM 40 MG PO TBEC
40.0000 mg | DELAYED_RELEASE_TABLET | Freq: Every day | ORAL | Status: DC
  Administered 2016-04-14 – 2016-04-27 (×11): 40 mg via ORAL
  Filled 2016-04-13 (×12): qty 1

## 2016-04-13 MED ORDER — ONDANSETRON HCL 4 MG/2ML IJ SOLN
4.0000 mg | Freq: Four times a day (QID) | INTRAMUSCULAR | Status: DC | PRN
  Filled 2016-04-13 (×2): qty 2

## 2016-04-13 MED ORDER — ACETAMINOPHEN 325 MG PO TABS
325.0000 mg | ORAL_TABLET | ORAL | Status: DC | PRN
Start: 2016-04-13 — End: 2016-04-27

## 2016-04-13 MED ORDER — HYDRALAZINE HCL 20 MG/ML IJ SOLN: 5.0000 mg | INTRAMUSCULAR | Status: DC | PRN

## 2016-04-13 MED ORDER — FENTANYL CITRATE (PF) 100 MCG/2ML IJ SOLN
INTRAMUSCULAR | Status: DC | PRN
  Administered 2016-04-13 (×3): 25 ug via INTRAVENOUS
  Administered 2016-04-13: 100 ug via INTRAVENOUS
  Administered 2016-04-13: 25 ug via INTRAVENOUS

## 2016-04-13 MED ORDER — SODIUM CHLORIDE 0.9 % IV SOLN: 250.0000 mL | INTRAVENOUS | Status: DC | PRN

## 2016-04-13 MED ORDER — POTASSIUM CHLORIDE CRYS ER 20 MEQ PO TBCR
20.0000 meq | EXTENDED_RELEASE_TABLET | Freq: Once | ORAL | Status: AC
  Administered 2016-04-13: 20 meq via ORAL
  Filled 2016-04-13: qty 1

## 2016-04-13 MED ORDER — SODIUM CHLORIDE 0.9 % IV SOLN
INTRAVENOUS | Status: DC
  Administered 2016-04-13 (×2): via INTRAVENOUS

## 2016-04-13 MED ORDER — MIDAZOLAM HCL 2 MG/2ML IJ SOLN
INTRAMUSCULAR | Status: AC
  Filled 2016-04-13: qty 2

## 2016-04-13 MED ORDER — DEXTROSE 5 % IV SOLN
1.5000 g | INTRAVENOUS | Status: AC
  Administered 2016-04-13: 1.5 g via INTRAVENOUS
  Administered 2016-04-13: 80 g via INTRAVENOUS
  Filled 2016-04-13: qty 1.5

## 2016-04-13 MED ORDER — OXYCODONE-ACETAMINOPHEN 5-325 MG PO TABS
1.0000 | ORAL_TABLET | ORAL | Status: DC | PRN
  Administered 2016-04-13 – 2016-04-14 (×4): 2 via ORAL
  Filled 2016-04-13 (×3): qty 2

## 2016-04-13 MED ORDER — SODIUM CHLORIDE 0.9% FLUSH: 3.0000 mL | INTRAVENOUS | Status: DC | PRN

## 2016-04-13 MED ORDER — PROPOFOL 10 MG/ML IV BOLUS
INTRAVENOUS | Status: DC | PRN
  Administered 2016-04-13: 100 mg via INTRAVENOUS

## 2016-04-13 MED ORDER — SODIUM CHLORIDE 0.9 % IR SOLN
Status: DC | PRN
  Administered 2016-04-13: 3000 mL

## 2016-04-13 MED ORDER — MORPHINE SULFATE (PF) 2 MG/ML IV SOLN
2.0000 mg | INTRAVENOUS | Status: DC | PRN
  Administered 2016-04-13: 2 mg via INTRAVENOUS
  Administered 2016-04-15: 5 mg via INTRAVENOUS
  Administered 2016-04-22 (×3): 2 mg via INTRAVENOUS
  Administered 2016-04-23 – 2016-04-25 (×3): 4 mg via INTRAVENOUS
  Filled 2016-04-13 (×3): qty 2
  Filled 2016-04-13: qty 1
  Filled 2016-04-13: qty 2
  Filled 2016-04-13 (×3): qty 1

## 2016-04-13 MED ORDER — HYDROMORPHONE HCL 1 MG/ML IJ SOLN
INTRAMUSCULAR | Status: AC
  Administered 2016-04-13: 0.5 mg via INTRAVENOUS
  Filled 2016-04-13: qty 0.5

## 2016-04-13 MED ORDER — SEVELAMER CARBONATE 800 MG PO TABS
800.0000 mg | ORAL_TABLET | Freq: Three times a day (TID) | ORAL | Status: DC
  Administered 2016-04-14 – 2016-04-24 (×21): 800 mg via ORAL
  Filled 2016-04-13 (×23): qty 1

## 2016-04-13 MED ORDER — LIDOCAINE HCL (CARDIAC) 20 MG/ML IV SOLN
INTRAVENOUS | Status: DC | PRN
  Administered 2016-04-13: 60 mg via INTRAVENOUS

## 2016-04-13 MED ORDER — CHLORHEXIDINE GLUCONATE CLOTH 2 % EX PADS: 6.0000 | MEDICATED_PAD | Freq: Once | CUTANEOUS | Status: DC

## 2016-04-13 MED ORDER — EPHEDRINE SULFATE 50 MG/ML IJ SOLN
INTRAMUSCULAR | Status: DC | PRN
  Administered 2016-04-13 (×2): 5 mg via INTRAVENOUS

## 2016-04-13 MED ORDER — CINACALCET HCL 30 MG PO TABS
120.0000 mg | ORAL_TABLET | Freq: Every day | ORAL | Status: DC
  Administered 2016-04-13 – 2016-04-23 (×11): 120 mg via ORAL
  Filled 2016-04-13 (×11): qty 4

## 2016-04-13 MED ORDER — GUAIFENESIN-DM 100-10 MG/5ML PO SYRP: 15.0000 mL | ORAL_SOLUTION | ORAL | Status: DC | PRN

## 2016-04-13 MED ORDER — HYDROMORPHONE HCL 1 MG/ML IJ SOLN
INTRAMUSCULAR | Status: AC
  Filled 2016-04-13: qty 0.5

## 2016-04-13 MED ORDER — PHENOL 1.4 % MT LIQD: 1.0000 | OROMUCOSAL | Status: DC | PRN

## 2016-04-13 MED ORDER — ONDANSETRON HCL 4 MG/2ML IJ SOLN
INTRAMUSCULAR | Status: AC
  Filled 2016-04-13: qty 2

## 2016-04-13 MED ORDER — LABETALOL HCL 300 MG PO TABS
300.0000 mg | ORAL_TABLET | Freq: Two times a day (BID) | ORAL | Status: DC
  Administered 2016-04-13 – 2016-04-16 (×3): 300 mg via ORAL
  Filled 2016-04-13 (×7): qty 1

## 2016-04-13 MED ORDER — MIDAZOLAM HCL 5 MG/5ML IJ SOLN
INTRAMUSCULAR | Status: DC | PRN
  Administered 2016-04-13: 2 mg via INTRAVENOUS

## 2016-04-13 MED ORDER — LABETALOL HCL 5 MG/ML IV SOLN: 10.0000 mg | INTRAVENOUS | Status: DC | PRN

## 2016-04-13 MED ORDER — 0.9 % SODIUM CHLORIDE (POUR BTL) OPTIME
TOPICAL | Status: DC | PRN
  Administered 2016-04-13: 1000 mL

## 2016-04-13 MED ORDER — PHENYLEPHRINE HCL 10 MG/ML IJ SOLN
INTRAMUSCULAR | Status: DC | PRN
  Administered 2016-04-13: 80 ug via INTRAVENOUS
  Administered 2016-04-13: 120 ug via INTRAVENOUS
  Administered 2016-04-13: 160 ug via INTRAVENOUS
  Administered 2016-04-13: 120 ug via INTRAVENOUS
  Administered 2016-04-13: 80 ug via INTRAVENOUS
  Administered 2016-04-13 (×2): 120 ug via INTRAVENOUS

## 2016-04-13 MED ORDER — HYDROMORPHONE HCL 1 MG/ML IJ SOLN
0.2500 mg | INTRAMUSCULAR | Status: DC | PRN
  Administered 2016-04-13 (×4): 0.5 mg via INTRAVENOUS

## 2016-04-13 MED ORDER — HYDROMORPHONE HCL 2 MG/ML IJ SOLN
INTRAMUSCULAR | Status: AC
  Filled 2016-04-13: qty 1

## 2016-04-13 SURGICAL SUPPLY — 42 items
CANISTER SUCTION 2500CC (MISCELLANEOUS) ×2 IMPLANT
CANISTER WOUND CARE 500ML ATS (WOUND CARE) ×2 IMPLANT
CONT SPEC 4OZ CLIKSEAL STRL BL (MISCELLANEOUS) ×4 IMPLANT
DERMABOND ADVANCED (GAUZE/BANDAGES/DRESSINGS) ×1
DERMABOND ADVANCED .7 DNX12 (GAUZE/BANDAGES/DRESSINGS) ×1 IMPLANT
DRSG VAC ATS MED SENSATRAC (GAUZE/BANDAGES/DRESSINGS) ×2 IMPLANT
ELECT REM PT RETURN 9FT ADLT (ELECTROSURGICAL) ×2
ELECTRODE REM PT RTRN 9FT ADLT (ELECTROSURGICAL) ×1 IMPLANT
FLUID NSS /IRRIG 3000 ML XXX (IV SOLUTION) ×2 IMPLANT
GLOVE BIO SURGEON STRL SZ 6.5 (GLOVE) ×4 IMPLANT
GLOVE BIO SURGEON STRL SZ7 (GLOVE) ×2 IMPLANT
GLOVE BIOGEL PI IND STRL 6.5 (GLOVE) ×1 IMPLANT
GLOVE BIOGEL PI IND STRL 7.0 (GLOVE) ×2 IMPLANT
GLOVE BIOGEL PI IND STRL 7.5 (GLOVE) ×1 IMPLANT
GLOVE BIOGEL PI IND STRL 8 (GLOVE) ×1 IMPLANT
GLOVE BIOGEL PI INDICATOR 6.5 (GLOVE) ×1
GLOVE BIOGEL PI INDICATOR 7.0 (GLOVE) ×2
GLOVE BIOGEL PI INDICATOR 7.5 (GLOVE) ×1
GLOVE BIOGEL PI INDICATOR 8 (GLOVE) ×1
GLOVE ECLIPSE 7.5 STRL STRAW (GLOVE) ×2 IMPLANT
GOWN STRL REUS W/ TWL LRG LVL3 (GOWN DISPOSABLE) ×4 IMPLANT
GOWN STRL REUS W/ TWL XL LVL3 (GOWN DISPOSABLE) ×1 IMPLANT
GOWN STRL REUS W/TWL LRG LVL3 (GOWN DISPOSABLE) ×4
GOWN STRL REUS W/TWL XL LVL3 (GOWN DISPOSABLE) ×1
HANDPIECE INTERPULSE COAX TIP (DISPOSABLE) ×1
HEMOSTAT SPONGE AVITENE ULTRA (HEMOSTASIS) IMPLANT
KIT BASIN OR (CUSTOM PROCEDURE TRAY) ×2 IMPLANT
KIT ROOM TURNOVER OR (KITS) ×2 IMPLANT
NS IRRIG 1000ML POUR BTL (IV SOLUTION) ×2 IMPLANT
PACK CV ACCESS (CUSTOM PROCEDURE TRAY) ×2 IMPLANT
PAD ARMBOARD 7.5X6 YLW CONV (MISCELLANEOUS) ×4 IMPLANT
SET HNDPC FAN SPRY TIP SCT (DISPOSABLE) ×1 IMPLANT
SUT ETHILON 3 0 PS 1 (SUTURE) IMPLANT
SUT MNCRL AB 4-0 PS2 18 (SUTURE) ×4 IMPLANT
SUT PROLENE 6 0 BV (SUTURE) IMPLANT
SUT SILK 0 TIES 10X30 (SUTURE) ×2 IMPLANT
SUT VIC AB 3-0 SH 27 (SUTURE)
SUT VIC AB 3-0 SH 27X BRD (SUTURE) IMPLANT
SWAB COLLECTION DEVICE MRSA (MISCELLANEOUS) ×2 IMPLANT
TUBE ANAEROBIC SPECIMEN COL (MISCELLANEOUS) ×2 IMPLANT
UNDERPAD 30X30 (UNDERPADS AND DIAPERS) ×2 IMPLANT
WATER STERILE IRR 1000ML POUR (IV SOLUTION) ×2 IMPLANT

## 2016-04-13 NOTE — H&P (View-Only) (Signed)
Patient name: Corey [name] MRN: [id number] DOB: June 23, 1970 Sex: male  REASON FOR VISIT: post-op   HPI: Corey [name] is a 46 y.o. male who presents for follow-up status post left second stage basilic vein transposition with revision of anastomosis. His fistula was originally created on 12/09/2015 by Dr. [name]. The patient did report numbness in all of the fingers of his left hand following the initial procedure. He was last seen in our office on 03/23/2016 for staple removal. He had some areas of sloughed skin adjacent to his distal incision. Home health was contacted for daily dressing changes.  At that time, he reported mild to moderate symptoms in his left hand and forearm.   The patient is using a right IJ catheter for dialysis.  Today, he complains of worsening numbness to all of his left fingertips and left palm. He also feels that his left arm is weak. He also says that the wounds of his left upper arm have worsened. Home health never came to his home. He denies any drainage or foul odor. He notes that dead skin keeps coming off when he takes the dressing off. He denies any fever or chills. He has been putting Neosporin on his wound at home.  He is on Coumadin for atrial fibrillation.  Current Outpatient Prescriptions  Medication Sig Dispense Refill  . cinacalcet (SENSIPAR) 60 MG tablet Take 120 mg by mouth daily.     [name] gabapentin (NEURONTIN) 100 MG capsule Take 100 mg by mouth daily.    [name] labetalol (NORMODYNE) 300 MG tablet Take 300 mg by mouth 2 (two) times daily.    [name] oxyCODONE-acetaminophen (PERCOCET/ROXICET) 5-325 MG tablet Take 1 tablet by mouth every 6 (six) hours as needed. 15 tablet 0  . sevelamer carbonate (RENVELA) 800 MG tablet Take 800 mg by mouth 3 (three) times daily with meals.    . warfarin (COUMADIN) 7.5 MG tablet Take 7.5 mg by mouth daily.     No current facility-administered medications for this visit.     REVIEW OF SYSTEMS:  [X]  denotes positive finding, [ ]  denotes  negative finding Cardiac  Comments:  Chest pain or chest pressure:    Shortness of breath upon exertion:    Short of breath when lying flat:    Irregular heart rhythm:    Constitutional    Fever or chills:      PHYSICAL EXAM: Vitals:   04/08/16 1353  BP: 132/86  Pulse: 81  Resp: 16  Temp: 98.3 F (36.8 C)  TempSrc: Oral  SpO2: 93%  Weight: 192 lb 14.4 oz (87.5 kg)  Height: 6\' 2"  (1.88 m)    GENERAL: The patient is a well-nourished male, in no acute distress. The vital signs are documented above. VASCULAR: Right IJ TDC. Palpable thrill left upper arm fistula. Left distal incision is open with visible vicryl sutures. Large area of necrosis surrounding the incision. No drainage or foul odor noted. Non palpable left radial pulse. Left hand is warm with some sensation intact, but decreased. Left grip intact 4/5.   MEDICAL ISSUES: Status post left second stage basilic vein transposition with revision of anastomosis Post-operative wound infection  Large area of skin necrosis and wound dehiscence at distal arm incision. Dr. [name] also evaluated patient. The patient had some skin sloughing at his appointment on 03/23/2016. Home health was contacted, but never came to the patient's home. Need debridement of his left upper arm wound. Discussed that if his fistula becomes exposed intra-op, will need ligation.  He will be set up for debridement of left upper arm wound with possible ligation of left upper arm fistula on 04/13/2016 with Dr. [name]. He will hold his Coumadin until then. The patient may need to be admitted following his surgery to have home health wound care set up.  He was given prescription for pain. Percocet 5/325 #15.  Virgina Jock, PA-C [facility] MD: [name]

## 2016-04-13 NOTE — Progress Notes (Signed)
ANTICOAGULATION CONSULT NOTE - Initial Consult  Pharmacy Consult:  Argatroban  Indication:  History of Afib, suspected HIT  Allergies  Allergen Reactions  . Heparin Other (See Comments)    UNSPECIFIED REACTION :  On Coumadin since 2004 ? ? HIT ? ?    Patient Measurements: Height: 6\' 2"  (188 cm) Weight: 191 lb 2.2 oz (86.7 kg) IBW/kg (Calculated) : 82.2  Vital Signs: Temp: 98.7 F (37.1 C) (01/10 1749) Temp Source: Oral (01/10 1749) BP: 143/88 (01/10 1749) Pulse Rate: 81 (01/10 1749)  Labs:  Recent Labs  04/13/16 0935 04/13/16 0937 04/13/16 1858  HGB  --  10.9* 10.3*  HCT  --  32.0* 33.3*  PLT  --   --  176  APTT 71*  --   --   LABPROT 25.8*  --  24.5*  INR 2.32  --  2.17    CrCl cannot be calculated (No order found.).   Medical History: Past Medical History:  Diagnosis Date  . A-fib ([place])   . Arthritis    hands and shoulders  . Blindness and low vision   . Dissection of aorta ([place]) 2004  . Dysrhythmia    A-fib  . ESRD (end stage renal disease) ([place])   . Headache   . History of cardioversion 2014  . Hypertension   . Neuropathy ([place])   . Paralysis ([place])    due to dissection of aorta in 2004, lower extremities  . Pneumonia       Assessment: 62 YOM with history of Afib (CHADsVASc = 1) on Coumadin PTA.  Patient has a history of left second stage basilic vein transposition placement with revision of anastomosis on 03/09/16.  Patient has skin necrosis and wound dehiscence at the distal arm, now s/p I&D and wound VAC placement today.   Pharmacy consulted to initiate argatroban.  Other than a poorly documented heparin allergy, I am unable to find any test indicating that the patient has HIT.  His platelet count is WNL.  Patient is unaware of any heparin allergy and stated that his last Coumadin dose was on 04/08/16.   His INR is currently therapeutic.   Goal of Therapy:  aPTT 50 - 90 seconds Monitor platelets by anticoagulation protocol: Yes    Plan:  -  Spoke to Dr. Trula Slade, will f/u with INR in AM and clarify allergy/indication with MD prior to initiating argatroban.   Tacari Repass D. Mina Marble, PharmD, BCPS Pager:  [phone] 04/13/2016, 8:27 PM

## 2016-04-13 NOTE — Anesthesia Preprocedure Evaluation (Addendum)
Anesthesia Evaluation  Patient identified by MRN, date of birth, ID band Patient awake    Reviewed: Allergy & Precautions, H&P , NPO status , Patient's Chart, lab work & pertinent test results, reviewed documented beta blocker date and time   Airway Mallampati: II  TM Distance: >3 FB Neck ROM: Full    Dental no notable dental hx. (+) Teeth Intact, Dental Advisory Given   Pulmonary neg pulmonary ROS,    Pulmonary exam normal breath sounds clear to auscultation       Cardiovascular hypertension, Pt. on medications and Pt. on home beta blockers + dysrhythmias Atrial Fibrillation  Rhythm:Regular Rate:Normal     Neuro/Psych  Headaches, negative psych ROS   GI/Hepatic negative GI ROS, Neg liver ROS,   Endo/Other  negative endocrine ROS  Renal/GU ESRF and DialysisRenal disease  negative genitourinary   Musculoskeletal  (+) Arthritis , Osteoarthritis,    Abdominal   Peds  Hematology negative hematology ROS (+)   Anesthesia Other Findings   Reproductive/Obstetrics negative OB ROS                            Anesthesia Physical Anesthesia Plan  ASA: III  Anesthesia Plan: General   Post-op Pain Management:    Induction: Intravenous  Airway Management Planned: LMA  Additional Equipment:   Intra-op Plan:   Post-operative Plan: Extubation in OR  Informed Consent: I have reviewed the patients History and Physical, chart, labs and discussed the procedure including the risks, benefits and alternatives for the proposed anesthesia with the patient or authorized representative who has indicated his/her understanding and acceptance.   Dental advisory given  Plan Discussed with: CRNA  Anesthesia Plan Comments:         Anesthesia Quick Evaluation

## 2016-04-13 NOTE — Anesthesia Procedure Notes (Signed)
Procedure Name: LMA Insertion Date/Time: 04/13/2016 11:12 AM Performed by: Shirlyn Goltz Pre-anesthesia Checklist: Patient identified, Emergency Drugs available, Patient being monitored and Suction available Patient Re-evaluated:Patient Re-evaluated prior to inductionOxygen Delivery Method: Circle system utilized Preoxygenation: Pre-oxygenation with 100% oxygen Intubation Type: IV induction LMA: LMA inserted LMA Size: 5.0 Number of attempts: 1 Placement Confirmation: positive ETCO2 and breath sounds checked- equal and bilateral Tube secured with: Tape Dental Injury: Teeth and Oropharynx as per pre-operative assessment

## 2016-04-13 NOTE — Progress Notes (Signed)
Pt has arrived to 2w from peri-op. Telemetry box applied and CCMD notified. Pt oriented to room. Pt has wound vac to left upper arm running continuously. Pt denies pain at this time. Dinner tray has been ordered for pt. Will continue current plan of care.  Grant Fontana BSN, RN

## 2016-04-13 NOTE — Op Note (Addendum)
    OPERATIVE NOTE   PROCEDURE: 1. Irrigation and excision of dead skin of left upper arm (7 cm x 11 cm) 2. Placement of negative pressure dressing  PRE-OPERATIVE DIAGNOSIS: Ischemic upper arm skin skin  POST-OPERATIVE DIAGNOSIS: Calciphylaxis  SURGEON: [name], MD  ASSISTANT(S): Silva Bandy, PAC   ANESTHESIA: general  ESTIMATED BLOOD LOSS: 30 cc  FINDING(S): 1.  Calcified skin consistent with calciphylaxis 2.  Viable subcutaneous tissue with intact skin overlying entire fistula 3.  Continued viable fistula at end of case  SPECIMEN(S):  Anaerobic and aerobic cultures of wound, excised skin sent for tissue evaluation  INDICATIONS:   Corey [name] is a 46 y.o. male who presents with development of ischemic skin adjacent to the prior distal vein harvest done as part of a single stage basilic vein transposition.  I recommended to the patient: left arm exploration, skin debridement, and possible fistula ligation. Risk, benefits, and alternatives to access surgery were discussed.  The patient is aware the risks include but are not limited to: bleeding, infection, nerve damage, need for additional procedures, death and stroke.  The patient agrees to proceed forward with the procedure.   DESCRIPTION: After obtaining full informed written consent, the patient was brought back to the operating room and placed supine upon the operating table.  The patient received IV antibiotics prior to induction.  After obtaining adequate anesthesia, the patient was prepped and draped in the standard fashion for: left arm procedure.  The patient baseline has a contracture in the left arm.  The arm was extended and lifted off the arm board to provide adequate exposure.  I sharply excised the dead skin with a 15-blade.  This was extremely difficult as the skin appeared to be calcified.  With further dissection, the subcutaneous tissue appeared to be viable with calcified dead skin that was black.  I had to use a  heavy Mayo scissor to cut out the remaining calcified dead skin.  The skin overlying the fistula appeared to viable with some superficial scab which was mechanically debrided with intact skin underneath.  I passed the skin off as a specimen and took cultures of the underlying subcutaneous tissue.  I washed off the upper arm with 3 L of sterile normal saline with a Pulsavac.  Some bleeding points were controlled with electrocautery and suture ligation of bigger bleeding points.   I elected to defer management of the possible steal issues in this patient's arm to avoid contaminating the potentially clean perianastomotic tissue.  Once the skin is healed up further, will need to come back and plicate the fistula further.  I sharply fashioned a medium sponge for this wound and affixed it with adhesive strips.  I cut a hole in the dressings and affixed a lilypad.  The VAC tubing was connected and the pump engaged at 125 continuous.  The VA dressing immediately became adherent.  Will have Plastic Surgery evaluate this patient tomorrow, as I suspect some degree of further debridement with Acell placement and subsequent skin graft might be needed.   COMPLICATIONS: none  CONDITION: stable   [name], MD [facility] [place] Office: [phone] Pager: [phone]  04/13/2016, 12:13 PM

## 2016-04-13 NOTE — Progress Notes (Signed)
Verbal order from MD to use HD cath for labs and IV line.  CRNA accessed.  Pt stated that since he has a heparin allergy, sodium citrate is used when de-accessing.

## 2016-04-13 NOTE — Anesthesia Postprocedure Evaluation (Signed)
Anesthesia Post Note  Patient: Corey [name]  Procedure(s) Performed: Procedure(s) (LRB): DEBRIDEMENT WOUND (Left) APPLICATION OF WOUND VAC (Left)  Patient location during evaluation: PACU Anesthesia Type: General Level of consciousness: awake and alert Pain management: pain level controlled Vital Signs Assessment: post-procedure vital signs reviewed and stable Respiratory status: spontaneous breathing, nonlabored ventilation, respiratory function stable and patient connected to nasal cannula oxygen Cardiovascular status: blood pressure returned to baseline and stable Postop Assessment: no signs of nausea or vomiting Anesthetic complications: no       Last Vitals:  Vitals:   04/13/16 1230 04/13/16 1330  BP: 110/89 124/70  Pulse: 83 75  Resp: (!) 23 16  Temp: 37.2 C     Last Pain:  Vitals:   04/13/16 1330  TempSrc:   PainSc: Asleep                 Kari Montero,W. EDMOND

## 2016-04-13 NOTE — Interval H&P Note (Signed)
History and Physical Interval Note:  04/13/2016 10:27 AM  Corey [name]  has presented today for surgery, with the diagnosis of End Stage Renal Disease N18.6; Wound dehiscence T81.31XA  The various methods of treatment have been discussed with the patient and family. After consideration of risks, benefits and other options for treatment, the patient has consented to  Procedure(s): DEBRIDEMENT WOUND (Left) LIGATION OF ARTERIOVENOUS  FISTULA (Left) as a surgical intervention .  The patient's history has been reviewed, patient examined, no change in status, stable for surgery.  I have reviewed the patient's chart and labs.  Questions were answered to the patient's satisfaction.     [name]

## 2016-04-13 NOTE — Transfer of Care (Signed)
Immediate Anesthesia Transfer of Care Note  Patient: Corey [name]  Procedure(s) Performed: Procedure(s): DEBRIDEMENT WOUND (Left) APPLICATION OF WOUND VAC (Left)  Patient Location: PACU  Anesthesia Type:General  Level of Consciousness: awake, oriented and patient cooperative  Airway & Oxygen Therapy: Patient Spontanous Breathing and Patient connected to face mask oxygen  Post-op Assessment: Report given to RN, Post -op Vital signs reviewed and stable and Patient moving all extremities  Post vital signs: Reviewed and stable  Last Vitals:  Vitals:   04/13/16 0908  BP: 137/72  Pulse: 82  Resp: 18  Temp: 36.5 C    Last Pain:  Vitals:   04/13/16 0925  TempSrc:   PainSc: 6       Patients Stated Pain Goal: 7 (45/84/83 [id number])  Complications: No apparent anesthesia complications

## 2016-04-14 ENCOUNTER — Encounter (HOSPITAL_COMMUNITY): Payer: Self-pay | Admitting: Vascular Surgery

## 2016-04-14 LAB — CBC
HEMATOCRIT: 32.3 % — AB (ref 39.0–52.0)
Hemoglobin: 9.9 g/dL — ABNORMAL LOW (ref 13.0–17.0)
MCH: 29.9 pg (ref 26.0–34.0)
MCHC: 30.7 g/dL (ref 30.0–36.0)
MCV: 97.6 fL (ref 78.0–100.0)
PLATELETS: 176 10*3/uL (ref 150–400)
RBC: 3.31 MIL/uL — ABNORMAL LOW (ref 4.22–5.81)
RDW: 16.2 % — AB (ref 11.5–15.5)
WBC: 5.9 10*3/uL (ref 4.0–10.5)

## 2016-04-14 LAB — BASIC METABOLIC PANEL
Anion gap: 12 (ref 5–15)
BUN: 23 mg/dL — ABNORMAL HIGH (ref 6–20)
CALCIUM: 8.7 mg/dL — AB (ref 8.9–10.3)
CO2: 27 mmol/L (ref 22–32)
CREATININE: 7.54 mg/dL — AB (ref 0.61–1.24)
Chloride: 98 mmol/L — ABNORMAL LOW (ref 101–111)
GFR, EST AFRICAN AMERICAN: 9 mL/min — AB (ref >60)
GFR, EST NON AFRICAN AMERICAN: 8 mL/min — AB (ref >60)
Glucose, Bld: 91 mg/dL (ref 65–99)
Potassium: 3.8 mmol/L (ref 3.5–5.1)
Sodium: 137 mmol/L (ref 135–145)

## 2016-04-14 LAB — PROTIME-INR
INR: 2.31
Prothrombin Time: 25.8 seconds — ABNORMAL HIGH (ref 11.4–15.2)

## 2016-04-14 MED ORDER — ONDANSETRON HCL 4 MG/2ML IJ SOLN
4.0000 mg | Freq: Four times a day (QID) | INTRAMUSCULAR | Status: DC | PRN
  Administered 2016-04-16 – 2016-04-17 (×2): 4 mg via INTRAVENOUS

## 2016-04-14 MED ORDER — DIPHENHYDRAMINE HCL 12.5 MG/5ML PO ELIX: 12.5000 mg | ORAL_SOLUTION | Freq: Four times a day (QID) | ORAL | Status: DC | PRN

## 2016-04-14 MED ORDER — CEFAZOLIN IN D5W 1 GM/50ML IV SOLN
1.0000 g | Freq: Once | INTRAVENOUS | Status: AC
  Administered 2016-04-14: 1 g via INTRAVENOUS
  Filled 2016-04-14: qty 50

## 2016-04-14 MED ORDER — SODIUM CHLORIDE 0.9% FLUSH
9.0000 mL | INTRAVENOUS | Status: DC | PRN
  Administered 2016-04-17: 9 mL via INTRAVENOUS
  Filled 2016-04-14: qty 9

## 2016-04-14 MED ORDER — SODIUM CHLORIDE 0.9 % IV SOLN: 100.0000 mL | INTRAVENOUS | Status: DC | PRN

## 2016-04-14 MED ORDER — ANTICOAGULANT SODIUM CITRATE 4% (200MG/5ML) IV SOLN
5.0000 mL | Freq: Once | Status: AC
  Administered 2016-04-14: 5 mL via INTRAVENOUS
  Filled 2016-04-14: qty 250

## 2016-04-14 MED ORDER — PENTAFLUOROPROP-TETRAFLUOROETH EX AERO: 1.0000 "application " | INHALATION_SPRAY | CUTANEOUS | Status: DC | PRN

## 2016-04-14 MED ORDER — NALOXONE HCL 0.4 MG/ML IJ SOLN: 0.4000 mg | INTRAMUSCULAR | Status: DC | PRN

## 2016-04-14 MED ORDER — ALTEPLASE 2 MG IJ SOLR: 2.0000 mg | Freq: Once | INTRAMUSCULAR | Status: DC | PRN

## 2016-04-14 MED ORDER — LIDOCAINE HCL (PF) 1 % IJ SOLN: 5.0000 mL | INTRAMUSCULAR | Status: DC | PRN

## 2016-04-14 MED ORDER — LIDOCAINE-PRILOCAINE 2.5-2.5 % EX CREA
1.0000 "application " | TOPICAL_CREAM | CUTANEOUS | Status: DC | PRN
  Filled 2016-04-14: qty 5

## 2016-04-14 MED ORDER — CEFAZOLIN SODIUM-DEXTROSE 2-4 GM/100ML-% IV SOLN
2.0000 g | INTRAVENOUS | Status: DC
  Filled 2016-04-14: qty 100

## 2016-04-14 MED ORDER — DIPHENHYDRAMINE HCL 50 MG/ML IJ SOLN: 12.5000 mg | Freq: Four times a day (QID) | INTRAMUSCULAR | Status: DC | PRN

## 2016-04-14 MED ORDER — MORPHINE SULFATE 2 MG/ML IV SOLN
INTRAVENOUS | Status: DC
  Administered 2016-04-14: 17:00:00 via INTRAVENOUS
  Administered 2016-04-14: 15 mg via INTRAVENOUS
  Administered 2016-04-15: 10.5 mg via INTRAVENOUS
  Administered 2016-04-15: 3 mg via INTRAVENOUS
  Administered 2016-04-15: 6 mg via INTRAVENOUS
  Administered 2016-04-15: 9 mg via INTRAVENOUS
  Administered 2016-04-15: 3 mg via INTRAVENOUS
  Administered 2016-04-15: 13.5 mg via INTRAVENOUS
  Administered 2016-04-16: 2 mg via INTRAVENOUS
  Administered 2016-04-16: 1.5 mg via INTRAVENOUS
  Administered 2016-04-16: 6 mg via INTRAVENOUS
  Administered 2016-04-16: 9 mg via INTRAVENOUS
  Administered 2016-04-17: 1.5 mg via INTRAVENOUS
  Administered 2016-04-17: 0 mg via INTRAVENOUS
  Administered 2016-04-17: 3 mg via INTRAVENOUS
  Administered 2016-04-17: 6 mg via INTRAVENOUS
  Administered 2016-04-17 – 2016-04-19 (×8): 0 mg via INTRAVENOUS
  Filled 2016-04-14 (×7): qty 25

## 2016-04-14 NOTE — Procedures (Signed)
  I was present at this dialysis session, have reviewed the session itself and made  appropriate changes Kelly Splinter MD [place] pager [phone]   04/14/2016, 9:46 AM

## 2016-04-14 NOTE — Consult Note (Signed)
Renal Service Consult Note [facility]  Corey [name] 04/14/2016 [place] D Requesting Physician:  Dr [name]  Reason for Consult:  ESRD pt with skin lesion HPI: The patient is a 46 y.o. year-old w ESRD , hx TAA dissection / repair in 2004, hx spinal cord injury related to dissection with bilat LE paralysis, HTN, hx afib w cardioversion, blindness / low vision.  He moved to [facility] from [place] in June 2017 and gets HD at [facility] on TTS schedule.  Is mostly WC dependent, had walked at one time several yrs after dissection, then lost ability again and is trying to gain it back.    He had LUA AVF 1st stage BVT done in Sept 2017.  He developed steal sydnrome and in Dec 2017 underwent 2nd stage BVT with revision of anastomosis for the steal symptoms. After the 2nd surgery he developed some non-healing wounds w skin necrosis w wound dehiscence over the fistula where the incisions had been.  He was admitted yesterday to undergo I&D of the necrotic skin.  A wound VAC was placed.  Today only c/o's are weakness in L hand and chronic pain/ numbness in the bilat LE's.    ROS  denies CP  no joint pain   no HA  no blurry vision  no rash  no diarrhea  no nausea/ vomiting  no dysuria  no difficulty voiding  no change in urine color    Past Medical History  Past Medical History:  Diagnosis Date  . A-fib ([place])   . Arthritis    hands and shoulders  . Blindness and low vision   . Dissection of aorta ([place]) 2004  . Dysrhythmia    A-fib  . ESRD (end stage renal disease) ([place])   . Headache   . History of cardioversion 2014  . Hypertension   . Neuropathy ([place])   . Paralysis ([place])    due to dissection of aorta in 2004, lower extremities  . Pneumonia    Past Surgical History  Past Surgical History:  Procedure Laterality Date  . APPLICATION OF WOUND VAC Left 04/13/2016   Procedure: APPLICATION OF WOUND VAC;  Surgeon: Conrad [place], MD;  Location: [place];  Service: Vascular;   Laterality: Left;  . AV FISTULA PLACEMENT    . BASCILIC VEIN TRANSPOSITION Left 12/09/2015   Procedure: FIRST STAGE BASILIC VEIN TRANSPOSITION LEFT UPPER ARM;  Surgeon: Conrad [place], MD;  Location: [place];  Service: Vascular;  Laterality: Left;  . BASCILIC VEIN TRANSPOSITION Left 03/09/2016   Procedure: SECOND STAGE BASILIC VEIN TRANSPOSITION WITH REVISION OF ANASTOMOSIS LEFT UPPER ARM;  Surgeon: Conrad Ester, MD;  Location: [place];  Service: Vascular;  Laterality: Left;  . CARDIOVERSION    . NONE    . REPAIR OF ACUTE ASCENDING THORACIC AORTIC DISSECTION    . WOUND DEBRIDEMENT Left 04/13/2016   Procedure: DEBRIDEMENT WOUND;  Surgeon: Conrad Hart, MD;  Location: [facility] OR;  Service: Vascular;  Laterality: Left;   Family History  Family History  Problem Relation Age of Onset  . Cancer Mother   . Hypertension Mother   . Cancer Father   . Hypertension Father   . Thyroid disease Sister   . Stroke Brother     31   Social History  reports that he has never smoked. He has never used smokeless tobacco. He reports that he does not drink alcohol or use drugs. Allergies  Allergies  Allergen Reactions  . Heparin Other (  See Comments)    UNSPECIFIED REACTION :  On Coumadin since 2004 ? ? HIT ? ?   Home medications Prior to Admission medications   Medication Sig Start Date End Date Taking? Authorizing Provider  cinacalcet (SENSIPAR) 60 MG tablet Take 120 mg by mouth daily.    Yes Historical Provider, MD  gabapentin (NEURONTIN) 100 MG capsule Take 100 mg by mouth daily.   Yes Historical Provider, MD  labetalol (NORMODYNE) 300 MG tablet Take 300 mg by mouth 2 (two) times daily.   Yes Historical Provider, MD  oxyCODONE-acetaminophen (PERCOCET/ROXICET) 5-325 MG tablet Take 1 tablet by mouth every 6 (six) hours as needed. Patient taking differently: Take 1 tablet by mouth every 6 (six) hours as needed for moderate pain.  04/08/16  Yes [name], PA-C  sevelamer carbonate (RENVELA) 800 MG tablet Take  800 mg by mouth 3 (three) times daily with meals.   Yes Historical Provider, MD  warfarin (COUMADIN) 5 MG tablet Take 7.5 mg by mouth daily.   Yes Historical Provider, MD   Liver Function Tests No results for input(s): AST, ALT, ALKPHOS, BILITOT, PROT, ALBUMIN in the last 168 hours. No results for input(s): LIPASE, AMYLASE in the last 168 hours. CBC  Recent Labs Lab 04/13/16 0937 04/13/16 1858 04/14/16 0336  WBC  --  6.0 5.9  HGB 10.9* 10.3* 9.9*  HCT 32.0* 33.3* 32.3*  MCV  --  97.4 97.6  PLT  --  176 854   Basic Metabolic Panel  Recent Labs Lab 04/13/16 0937 04/14/16 0336  NA 137 137  K 3.4* 3.8  CL  --  98*  CO2  --  27  GLUCOSE 97 91  BUN  --  23*  CREATININE  --  7.54*  CALCIUM  --  8.7*   Iron/TIBC/Ferritin/ %Sat No results found for: IRON, TIBC, FERRITIN, IRONPCTSAT  Vitals:   04/14/16 0825 04/14/16 0835 04/14/16 0900 04/14/16 0930  BP: 117/65 115/60 118/65 115/73  Pulse: 75 76 75 82  Resp: 16 15 16 15   Temp:      TempSrc:      SpO2: 100% 100% 100% 100%  Weight:      Height:       Exam Gen no distress, calm No rash, cyanosis or gangrene Sclera anicteric, throat clear  No jvd or bruits Chest clear bilat RRR 2/6 soft sem, no RG Abd soft ntnd no mass or ascites +bs GU normal male MS no joint effusions or deformity Ext 1+ LE edema L upper arm with wound VAC in place, +bruit LUA AVF Neuro is alert, Ox 3 , bilat LE weakness/ numbness to mid thigh bilat L grip 4/ 5 on L and 5/5 on R   D[facility]: [facility] 3h 52min   87kg   Hep none (allergic to heparin, using citrate)   R IJ cath IV Fe load in progress   Assessment: 1  Dermal necrosis - s/p excision 04/14/16 with wound VAC placement. Concern for calciphylaxis.  Path pending.  2  ESRD on HD using TDC. Started HD in [place] in 2004 after aortic dissection/ repair.  3  Paralysis - complications of #4, is WC dependent 4  Hx aortic dissection / repair '04 5  2HPTH on renvela 6  HTN cont labetalol/  sensipar 7  Anemia of CKD - getting IV iron load  Plan - HD today, await path results.  Hold IV Fe and all vit D/ Ca products for now given suspected calciphylaxis.  Kelly Splinter MD [employer] pager [phone]   04/14/2016, 10:07 AM

## 2016-04-14 NOTE — Progress Notes (Signed)
ANTICOAGULATION CONSULT NOTE  Pharmacy Consult:  Argatroban  Indication:  History of Afib, suspected HIT  Assessment: 62 YOM with history of Afib (CHADsVASc = 1) on Coumadin PTA.  Patient has a history of left second stage basilic vein transposition placement with revision of anastomosis on 03/09/16.  Patient has skin necrosis and wound dehiscence at the distal arm, now s/p I&D and wound VAC placement today.   Other than a poorly documented heparin allergy, there is no test indicating that the patient has HIT. Patient is unaware of any heparin allergy and stated that his last Coumadin dose was on 04/08/16.   His INR is currently therapeutic at 2.31. Platelet count is also normal at 176. No bleeding noted.   Plan:  - Per vascular note written by K. Trinh, no plans to start Argatroban today. Will discontinue consult at this time to eliminate any confusion. - Continue to hold warfarin for surgery - Pharmacy will continue to follow at a distance, however please re-consult for Argatroban if it is decided this is needed or if there is anything else pharmacy can assist with.    Allergies  Allergen Reactions  . Heparin Other (See Comments)    UNSPECIFIED REACTION :  On Coumadin since 2004 ? ? HIT ? ?    Recent Labs  04/13/16 0935  04/13/16 0937 04/13/16 1858 04/14/16 0336  HGB  --   < > 10.9* 10.3* 9.9*  HCT  --   --  32.0* 33.3* 32.3*  PLT  --   --   --  176 176  APTT 71*  --   --   --   --   LABPROT 25.8*  --   --  24.5* 25.8*  INR 2.32  --   --  2.17 2.31  CREATININE  --   --   --   --  7.54*  < > = values in this interval not displayed.  Aiyah Scarpelli D. Athen Riel, PharmD, BCPS Clinical Pharmacist Pager: [phone] 04/14/2016 10:07 AM

## 2016-04-14 NOTE — Progress Notes (Addendum)
[facility] Progress Note  Subjective  - POD #1  Left arm hurts.   Objective Vitals:   04/13/16 2239 04/14/16 0533  BP: 121/64 (!) 100/55  Pulse: 75 72  Resp:  18  Temp:  98.4 F (36.9 C)    Intake/Output Summary (Last 24 hours) at 04/14/16 0739 Last data filed at 04/13/16 2224  Gross per 24 hour  Intake              990 ml  Output               50 ml  Net              940 ml   Left arm VAC seal intact. Wound distal to VAC dressing clean.  Palpable thrill left upper arm fistula.   Assessment/Planning: 46 y.o. male is s/p: irrigation and excision dead skin left upper arm, placement of negative pressure dressing 1 Day Post-Op   Surgical pathology pending. Suspected calciphylaxis.  Wound culture with GPC in clusters. Will ask Dr. [name] about abx. VAC dressing change tomorrow. May require VAC change in OR and further debridement. Per Dr. [name].  Afib: INR is therapeutic. Will hold off on initiating argatroban today. Continue to hold coumadin in anticipation of surgery.  ESRD: renal consulted yesterday. Due for HD today.   Corey [name] 04/14/2016 7:39 AM --  Laboratory CBC    Component Value Date/Time   WBC 5.9 04/14/2016 0336   HGB 9.9 (L) 04/14/2016 0336   HCT 32.3 (L) 04/14/2016 0336   PLT 176 04/14/2016 0336    BMET    Component Value Date/Time   NA 137 04/14/2016 0336   K 3.8 04/14/2016 0336   CL 98 (L) 04/14/2016 0336   CO2 27 04/14/2016 0336   GLUCOSE 91 04/14/2016 0336   BUN 23 (H) 04/14/2016 0336   CREATININE 7.54 (H) 04/14/2016 0336   CALCIUM 8.7 (L) 04/14/2016 0336   GFRNONAA 8 (L) 04/14/2016 0336   GFRAA 9 (L) 04/14/2016 0336    COAG Lab Results  Component Value Date   INR 2.31 04/14/2016   INR 2.17 04/13/2016   INR 2.32 04/13/2016   No results found for: PTT  Antibiotics Anti-infectives    Start     Dose/Rate Route Frequency Ordered Stop   04/13/16 0902  cefUROXime (ZINACEF) 1.5 g in dextrose 5 % 50 mL IVPB       1.5 g 100 mL/hr over 30 Minutes Intravenous 30 min pre-op 04/13/16 0902 04/13/16 [place], PA-C [facility] Office: [phone] Pager: [phone] 04/14/2016 7:39 AM  Addendum  Pt hasn't been in room all AM due to HD.  Will check on patient later in day.  - Once INR drops below 2, will start Argatoban - Will allow Coumadin to reversal completely over the weekend - VAC change at bedside tomorrow - Plastic surgery consulted to assist with wound mgmt +/- skin grafting - Will return to OR on Monday with Plastics to re-evaluate +/- redebride the L arm. - Suspect Gram + in wound cx is likely contaminate but will start some Ancef as precaution - Awaiting Pathology evaluation for possible calciphylaxis - Will consider sodium thiosulfate during HD if pathology is consistent with calciphylaxis  [name], MD, FACS [facility] [place] Office: [phone] Pager: [phone]  04/14/2016, 12:17 PM   Addendum  Some difficulty with pain control in surgical site.  L hand  viable with warmth.  Hand grip 3-4/5. L upper arm VAC adherent, minimal blood in tubing.  - Ancef 1 g IV now ordered.  Ancef 1 g IV with HD. - D/C percocet.  Morphine PCA ordered - Pt already on Sensipar and [employer] Surgery evaluation and Pathology evaluation  [name], MD, [place] [facility] [place] Office: [phone] Pager: [phone]  04/14/2016, 2:25 PM

## 2016-04-15 LAB — BASIC METABOLIC PANEL
Anion gap: 14 (ref 5–15)
BUN: 15 mg/dL (ref 6–20)
CALCIUM: 8.4 mg/dL — AB (ref 8.9–10.3)
CO2: 28 mmol/L (ref 22–32)
CREATININE: 5.9 mg/dL — AB (ref 0.61–1.24)
Chloride: 96 mmol/L — ABNORMAL LOW (ref 101–111)
GFR calc Af Amer: 12 mL/min — ABNORMAL LOW (ref >60)
GFR, EST NON AFRICAN AMERICAN: 10 mL/min — AB (ref >60)
GLUCOSE: 86 mg/dL (ref 65–99)
Potassium: 4.6 mmol/L (ref 3.5–5.1)
Sodium: 138 mmol/L (ref 135–145)

## 2016-04-15 LAB — APTT
APTT: 50 s — AB (ref 24–36)
aPTT: 130 seconds — ABNORMAL HIGH (ref 24–36)

## 2016-04-15 LAB — PROTIME-INR
INR: 1.93
PROTHROMBIN TIME: 22.4 s — AB (ref 11.4–15.2)

## 2016-04-15 LAB — PHOSPHORUS: Phosphorus: 3.8 mg/dL (ref 2.5–4.6)

## 2016-04-15 MED ORDER — ARGATROBAN 50 MG/50ML IV SOLN
0.7700 ug/kg/min | INTRAVENOUS | Status: DC
  Filled 2016-04-15: qty 50

## 2016-04-15 MED ORDER — ARGATROBAN 50 MG/50ML IV SOLN
1.0000 ug/kg/min | INTRAVENOUS | Status: DC
  Administered 2016-04-15: 1 ug/kg/min via INTRAVENOUS
  Filled 2016-04-15: qty 50

## 2016-04-15 MED ORDER — ARGATROBAN 50 MG/50ML IV SOLN
0.7000 ug/kg/min | INTRAVENOUS | Status: DC
  Administered 2016-04-15: 0.7 ug/kg/min via INTRAVENOUS
  Filled 2016-04-15 (×2): qty 50

## 2016-04-15 NOTE — Progress Notes (Signed)
[facility] Progress Note  Dialysis Orders: [facility] 3h 62min   87kg   Hep none (allergic to heparin, using citrate)   R IJ cath IV Fe load in progress   Assessment/Plan: 1  Dermal necrosis - s/p excision 04/14/16 with wound VAC placement. Concern for calciphylaxis.  wound cx +MSSA Ancef started.  Path pending.  2  ESRD on HD using TDC. Started HD in [place] in 2004 after aortic dissection/ repair.  3  Paralysis - complications of #4, is WC dependent 4  Hx aortic dissection / repair '04 5  2HPTH on renvela/sensipar - hold VDRA/Ca products with suspected calciphalaxis 6  HTN cont labetalol/  7  Anemia of CKD - holding Fe with suspected calciphalaxis  HD tomorrow no heparin, await path results   Lynnda Child PA-C [facility] Pager [phone] 04/15/2016,4:12 PM  LOS: 2 days   Pt seen, examined and agree w A/P as above.  Kelly Splinter MD [facility] pager [phone]   04/15/2016, 5:03 PM     Subjective:  C/o of arm pain   Objective Vitals:   04/15/16 1054 04/15/16 1129 04/15/16 1300 04/15/16 1531  BP: 102/62  110/69   Pulse: 80  81   Resp:  14 17 12   Temp:   98.7 F (37.1 C)   TempSrc:   Oral   SpO2:  99% 97% 95%  Weight:      Height:       Physical Exam General: WNWD AAM NAD Heart: RRR Lungs: CTAB  Abdomen: soft NT ND Extremities: L UE with necrotic wound dressed with wound VAC  Dialysis Access: R IJ [facility]   Additional Objective Labs: Basic Metabolic Panel:  Recent Labs Lab 04/13/16 0937 04/14/16 0336 04/15/16 0442  NA 137 137 138  K 3.4* 3.8 4.6  CL  --  98* 96*  CO2  --  27 28  GLUCOSE 97 91 86  BUN  --  23* 15  CREATININE  --  7.54* 5.90*  CALCIUM  --  8.7* 8.4*  PHOS  --   --  3.8   Liver Function Tests: No results for input(s): AST, ALT, ALKPHOS, BILITOT, PROT, ALBUMIN in the last 168 hours. No results for input(s): LIPASE, AMYLASE in the last 168 hours. CBC:  Recent Labs Lab 04/13/16 0937  04/13/16 1858 04/14/16 0336  WBC  --  6.0 5.9  HGB 10.9* 10.3* 9.9*  HCT 32.0* 33.3* 32.3*  MCV  --  97.4 97.6  PLT  --  176 176   Blood Culture    Component Value Date/Time   SDES ABSCESS LEFT UPPER ARM 04/13/2016 1135   SPECREQUEST NONE 04/13/2016 1135   CULT  04/13/2016 1135    ABUNDANT STAPHYLOCOCCUS AUREUS NO ANAEROBES ISOLATED; CULTURE IN PROGRESS FOR 5 DAYS    REPTSTATUS PENDING 04/13/2016 1135    Cardiac Enzymes: No results for input(s): CKTOTAL, CKMB, CKMBINDEX, TROPONINI in the last 168 hours. CBG: No results for input(s): GLUCAP in the last 168 hours. Iron Studies: No results for input(s): IRON, TIBC, TRANSFERRIN, FERRITIN in the last 72 hours. Lab Results  Component Value Date   INR 1.93 04/15/2016   INR 2.31 04/14/2016   INR 2.17 04/13/2016   Medications: . argatroban 0.7 mcg/kg/min (04/15/16 1531)   . [START ON 04/16/2016]  ceFAZolin (ANCEF) IV  2 g Intravenous Q T,Th,Sat-1800  . cinacalcet  120 mg Oral Q supper  . gabapentin  100 mg Oral QHS  . labetalol  300 mg Oral BID  . morphine   Intravenous Q4H  . pantoprazole  40 mg Oral Daily  . sevelamer carbonate  800 mg Oral TID WC  . sodium chloride flush  3 mL Intravenous Q12H

## 2016-04-15 NOTE — Consult Note (Signed)
Reason for Consult: open wound left arm Referring Physician: B. [name] MD Location: [facility] Date: 1.12.18  Corey [name] is an 46 y.o. male.  HPI: Patient admitted 04/13/16 for debridement LUE. Patient has complex medical history including ESRD , TAA dissection / repair in 2004, hx spinal cord injury related to dissection with bilat LE paralysis, HTN, afib on anticoagulation, blindness / low vision. Receives HD on TTSa schedule.   Underwent first stage basilic vein transposition 12/2015 and developed steal syndrome. He underwent revision of anastomosis and second stage transposition. Per chart developed progressive skin necrosis over LUE. Intraoperatively suspicious for calciphylaxis, fistula remained covered with soft tissue. VAC in place. Culture with S. Aureus, on Ancef, possible contaminant. Current HD access right IJ catheter.  Patient recently moved here, living with brother who is disabled from multiple strokes but states he is independently living.  Patient notes significant pain with VAC change at bedside this am.  Past Medical History:  Diagnosis Date  . A-fib ([place])   . Arthritis    hands and shoulders  . Blindness and low vision   . Dissection of aorta ([place]) 2004  . Dysrhythmia    A-fib  . ESRD (end stage renal disease) ([place])   . Headache   . History of cardioversion 2014  . Hypertension   . Neuropathy ([place])   . Non-healing non-surgical wound 03/2016  . Paralysis ([place])    due to dissection of aorta in 2004, lower extremities  . Pneumonia     Past Surgical History:  Procedure Laterality Date  . APPLICATION OF WOUND VAC Left 04/13/2016   Procedure: APPLICATION OF WOUND VAC;  Surgeon: Conrad [place], MD;  Location: [place];  Service: Vascular;  Laterality: Left;  . AV FISTULA PLACEMENT    . BASCILIC VEIN TRANSPOSITION Left 12/09/2015   Procedure: FIRST STAGE BASILIC VEIN TRANSPOSITION LEFT UPPER ARM;  Surgeon: Conrad Chokoloskee, MD;  Location: [place];  Service: Vascular;   Laterality: Left;  . BASCILIC VEIN TRANSPOSITION Left 03/09/2016   Procedure: SECOND STAGE BASILIC VEIN TRANSPOSITION WITH REVISION OF ANASTOMOSIS LEFT UPPER ARM;  Surgeon: Conrad Cabazon, MD;  Location: [place];  Service: Vascular;  Laterality: Left;  . CARDIOVERSION    . NONE    . REPAIR OF ACUTE ASCENDING THORACIC AORTIC DISSECTION    . WOUND DEBRIDEMENT Left 04/13/2016   Procedure: DEBRIDEMENT WOUND;  Surgeon: Conrad Loachapoka, MD;  Location: [place];  Service: Vascular;  Laterality: Left;    Family History  Problem Relation Age of Onset  . Cancer Mother   . Hypertension Mother   . Cancer Father   . Hypertension Father   . Thyroid disease Sister   . Stroke Brother     69    Social History:  reports that he has never smoked. He has never used smokeless tobacco. He reports that he does not drink alcohol or use drugs.  Allergies:  Allergies  Allergen Reactions  . Heparin Other (See Comments)    UNSPECIFIED REACTION :  On Coumadin since 2004 ? ? HIT ? ?    Medications: I have reviewed the patient's current medications.  Results for orders placed or performed during the hospital encounter of 04/13/16 (from the past 48 hour(s))  Protime-INR     Status: Abnormal   Collection Time: 04/13/16  6:58 PM  Result Value Ref Range   Prothrombin Time 24.5 (H) 11.4 - 15.2 seconds   INR 2.17   CBC  Status: Abnormal   Collection Time: 04/13/16  6:58 PM  Result Value Ref Range   WBC 6.0 4.0 - 10.5 K/uL   RBC 3.42 (L) 4.22 - 5.81 MIL/uL   Hemoglobin 10.3 (L) 13.0 - 17.0 g/dL   HCT 33.3 (L) 39.0 - 52.0 %   MCV 97.4 78.0 - 100.0 fL   MCH 30.1 26.0 - 34.0 pg   MCHC 30.9 30.0 - 36.0 g/dL   RDW 16.3 (H) 11.5 - 15.5 %   Platelets 176 150 - 400 K/uL  CBC     Status: Abnormal   Collection Time: 04/14/16  3:36 AM  Result Value Ref Range   WBC 5.9 4.0 - 10.5 K/uL   RBC 3.31 (L) 4.22 - 5.81 MIL/uL   Hemoglobin 9.9 (L) 13.0 - 17.0 g/dL   HCT 32.3 (L) 39.0 - 52.0 %   MCV 97.6 78.0 - 100.0 fL   MCH  29.9 26.0 - 34.0 pg   MCHC 30.7 30.0 - 36.0 g/dL   RDW 16.2 (H) 11.5 - 15.5 %   Platelets 176 150 - 400 K/uL  Basic metabolic panel     Status: Abnormal   Collection Time: 04/14/16  3:36 AM  Result Value Ref Range   Sodium 137 135 - 145 mmol/L   Potassium 3.8 3.5 - 5.1 mmol/L   Chloride 98 (L) 101 - 111 mmol/L   CO2 27 22 - 32 mmol/L   Glucose, Bld 91 65 - 99 mg/dL   BUN 23 (H) 6 - 20 mg/dL   Creatinine, Ser 7.54 (H) 0.61 - 1.24 mg/dL   Calcium 8.7 (L) 8.9 - 10.3 mg/dL   GFR calc non Af Amer 8 (L) >60 mL/min   GFR calc Af Amer 9 (L) >60 mL/min    Comment: (NOTE) The eGFR has been calculated using the CKD EPI equation. This calculation has not been validated in all clinical situations. eGFR's persistently <60 mL/min signify possible Chronic Kidney Disease.    Anion gap 12 5 - 15  Protime-INR     Status: Abnormal   Collection Time: 04/14/16  3:36 AM  Result Value Ref Range   Prothrombin Time 25.8 (H) 11.4 - 15.2 seconds   INR 2.31   Protime-INR     Status: Abnormal   Collection Time: 04/15/16  4:42 AM  Result Value Ref Range   Prothrombin Time 22.4 (H) 11.4 - 15.2 seconds   INR 6.27   Basic metabolic panel     Status: Abnormal   Collection Time: 04/15/16  4:42 AM  Result Value Ref Range   Sodium 138 135 - 145 mmol/L   Potassium 4.6 3.5 - 5.1 mmol/L    Comment: DELTA CHECK NOTED   Chloride 96 (L) 101 - 111 mmol/L   CO2 28 22 - 32 mmol/L   Glucose, Bld 86 65 - 99 mg/dL   BUN 15 6 - 20 mg/dL   Creatinine, Ser 5.90 (H) 0.61 - 1.24 mg/dL   Calcium 8.4 (L) 8.9 - 10.3 mg/dL   GFR calc non Af Amer 10 (L) >60 mL/min   GFR calc Af Amer 12 (L) >60 mL/min    Comment: (NOTE) The eGFR has been calculated using the CKD EPI equation. This calculation has not been validated in all clinical situations. eGFR's persistently <60 mL/min signify possible Chronic Kidney Disease.    Anion gap 14 5 - 15  Phosphorus     Status: None   Collection Time: 04/15/16  4:42 AM  Result Value  Ref  Range   Phosphorus 3.8 2.5 - 4.6 mg/dL    ROS Blood pressure 110/69, pulse 81, temperature 98.7 F (37.1 C), temperature source Oral, resp. rate 17, height '6\' 2"'$  (1.88 m), weight 86.8 kg (191 lb 5.8 oz), SpO2 97 %. Physical Exam Alert, oriented VAC left arm in place without cellulitis or significant drainage Fistula proximal to this   Assessment/Plan: Reviewed photos of VAC change today. Appears clean with no further necrosis, SQ tissue exposed. Plan repeat VAC change 04/18/16 and I will be present for this. Discussed methods to hasten wound healing including VAC, skin substitutes such as A Cell or Theraskin, and possible skin grafts. Discussed porcine source of A Cell. Awaiting pathology- if this indeed calciphylaxis I would have concern with skin graft and additional wound of donor site that would need to heal. However there is a paucity of literature regarding skin grafting and calciphylaxis. Counseled skin graft if successful would allow most rapid wound healing and his LUE fistula will not be useful to him if he has continued presence wound in area or that threatens its exposure.  Would wait for granulated bed to form prior to grafting as the SQ fat does not support graft well. Offered placement of skin substitute on 1/15- he is unsure about this, also not sure he can be awake for VAC changes. Will add on to OR procedure, but will discuss with patient am 1/15 again whether he desires this.  Irene Limbo, MD [facility] [phone], pin [phone]

## 2016-04-15 NOTE — Progress Notes (Addendum)
[facility] Progress Note  Subjective  - POD #2  Pain with left arm wound  Objective Vitals:   04/15/16 0033 04/15/16 0434  BP:    Pulse:    Resp: 12 19  Temp:      Intake/Output Summary (Last 24 hours) at 04/15/16 0735 Last data filed at 04/14/16 1800  Gross per 24 hour  Intake              360 ml  Output                0 ml  Net              360 ml   Left upper arm wound is clean with healthy red granulation tissue.   Assessment/Planning: 46 y.o. male is s/p:  irrigation and excision dead skin left upper arm, placement of negative pressure dressing 2 Days Post-Op   Wound culture showing abundant S. aureus. Currently on Ancef. Believed to be contaminant.  Surgical pathology pending. If positive for calciphylaxis, consider starting sodium thiosulfate during HD.  Start argatroban today as INR <2.  Plastics consulted for wound management and possible skin grafting.  Patient to return to OR Monday jointly with plastics.   Corey [name] 04/15/2016 7:35 AM --  Laboratory CBC    Component Value Date/Time   WBC 5.9 04/14/2016 0336   HGB 9.9 (L) 04/14/2016 0336   HCT 32.3 (L) 04/14/2016 0336   PLT 176 04/14/2016 0336    BMET    Component Value Date/Time   NA 138 04/15/2016 0442   K 4.6 04/15/2016 0442   CL 96 (L) 04/15/2016 0442   CO2 28 04/15/2016 0442   GLUCOSE 86 04/15/2016 0442   BUN 15 04/15/2016 0442   CREATININE 5.90 (H) 04/15/2016 0442   CALCIUM 8.4 (L) 04/15/2016 0442   GFRNONAA 10 (L) 04/15/2016 0442   GFRAA 12 (L) 04/15/2016 0442    COAG Lab Results  Component Value Date   INR 1.93 04/15/2016   INR 2.31 04/14/2016   INR 2.17 04/13/2016   No results found for: PTT  Antibiotics Anti-infectives    Start     Dose/Rate Route Frequency Ordered Stop   04/16/16 1800  ceFAZolin (ANCEF) IVPB 2g/100 mL premix    Comments:  Send with pt to OR   2 g 200 mL/hr over 30 Minutes Intravenous Every T-Th-Sa (1800) 04/14/16 1425     04/14/16 1430  ceFAZolin (ANCEF) IVPB 1 g/50 mL premix     1 g 100 mL/hr over 30 Minutes Intravenous  Once 04/14/16 1425 04/14/16 1759   04/13/16 0902  cefUROXime (ZINACEF) 1.5 g in dextrose 5 % 50 mL IVPB     1.5 g 100 mL/hr over 30 Minutes Intravenous 30 min pre-op 04/13/16 0902 04/13/16 [place], PA-C [facility] Office: [phone] Pager: [phone] 04/15/2016 7:35 AM  Addendum  I have independently interviewed and examined the patient, and I agree with the physician assistant's findings.  Pt switched to PCA yesterday.  Wound bed looks well vascularized and clean.  I don't see extension at the edges.  Awaiting wound C&S.  Continue Ancef for now.  Awaiting Pathology  - Plastic consult pending (already discussed with Dr. Iran Planas)  - Return to OR with Plastics on Monday (Dr. Donzetta Matters will be filling in for me as I will be on leave)  [name], MD, FACS [facility] Office:  [phone] Pager: [phone]  04/15/2016, 8:17 AM

## 2016-04-15 NOTE — Progress Notes (Signed)
1 mL of morphine wasted from morphine syringe in PCA pump. This was wasted with Urban Gibson, Rn, in the sharps container. New morphine syringe started in PCA pump

## 2016-04-15 NOTE — Progress Notes (Addendum)
ANTICOAGULATION CONSULT NOTE - Follow Up Consult  Pharmacy Consult for Argatroban Indication: atrial fibrillation  Allergies  Allergen Reactions  . Heparin Other (See Comments)    UNSPECIFIED REACTION :  On Coumadin since 2004 ? ? HIT ? ?    Patient Measurements: Height: 6\' 2"  (188 cm) Weight: 191 lb 5.8 oz (86.8 kg) IBW/kg (Calculated) : 82.2  Vital Signs: BP: 116/72 (01/11 2315) Pulse Rate: 87 (01/11 2315)  Labs:  Recent Labs  04/13/16 0935  04/13/16 0937 04/13/16 1858 04/14/16 0336 04/15/16 0442  HGB  --   < > 10.9* 10.3* 9.9*  --   HCT  --   --  32.0* 33.3* 32.3*  --   PLT  --   --   --  176 176  --   APTT 71*  --   --   --   --   --   LABPROT 25.8*  --   --  24.5* 25.8* 22.4*  INR 2.32  --   --  2.17 2.31 1.93  CREATININE  --   --   --   --  7.54* 5.90*  < > = values in this interval not displayed.  Estimated Creatinine Clearance: 18.4 mL/min (by C-G formula based on SCr of 5.9 mg/dL (H)).  Assessment: 45yom on coumadin PTA for hx Afib (CHADsVASc = 1), admitted 1/10 for left upper arm I&D. Coumadin held and pharmacy asked to bridge with argatroban given hx HIT. Unable to find anything to indicate patient had HIT besides an allergy that is not well documented and patient unaware of heparin allergy. INR 2.31 yesterday so argatroban not started. INR down to 1.93 today so will start argatroban bridge. Plan for OR on Monday with plastics.   CBC stable - Hgb 9.9, platelets 176.  Goal of Therapy:  aPTT 50-90 seconds Monitor platelets by anticoagulation protocol: Yes   Plan:  1) Start argatroban 34mcg/kg/min 2) Check aPTT every 2 hours until therapeutic x 2 then daily 3) Continue to hold coumadin  Corey [name], Corey [name] 04/15/2016,10:00 AM   ADDENDUM: Initial aPTT supratherapeutic at 130 seconds. Decrease argatroban to 0.91mcg/kg/min and check another aPTT in 2 hours.  Corey [name], Corey [name] 04/15/2016, 3:30 PM

## 2016-04-15 NOTE — Progress Notes (Addendum)
ANTICOAGULATION CONSULT NOTE - FOLLOW UP    aPTT = 50 (goal 50-90 sec)   Assessment: [name] with history of Afib (CHADsVASc = 1) on Coumadin PTA.  Patient has a history of left second stage basilic vein transposition placement with revision of anastomosis on 03/09/16.  Patient has skin necrosis and wound dehiscence at the distal arm, s/p I&D and wound VAC placement on 04/13/16.   Pharmacy consulted to manage argatroban since INR decreased to a sub-therapeutic level.  There is a questionable heparin allergy.  APTT reduced to therapeutic level post rate adjustment; it is toward the low end of normal.  No bleeding reported.   Plan: - Increase argatroban slightly to 0.77 mcg/kg/min - Repeat aPTT in 2 hours   Corey [name], PharmD, BCPS 04/15/2016, 8:41 PM

## 2016-04-16 LAB — APTT
APTT: 134 s — AB (ref 24–36)
APTT: 112 s — AB (ref 24–36)
aPTT: 112 seconds — ABNORMAL HIGH (ref 24–36)
aPTT: 82 seconds — ABNORMAL HIGH (ref 24–36)

## 2016-04-16 LAB — RENAL FUNCTION PANEL
Albumin: 2.7 g/dL — ABNORMAL LOW (ref 3.5–5.0)
Anion gap: 11 (ref 5–15)
BUN: 14 mg/dL (ref 6–20)
CHLORIDE: 97 mmol/L — AB (ref 101–111)
CO2: 28 mmol/L (ref 22–32)
Calcium: 8.6 mg/dL — ABNORMAL LOW (ref 8.9–10.3)
Creatinine, Ser: 5.28 mg/dL — ABNORMAL HIGH (ref 0.61–1.24)
GFR, EST AFRICAN AMERICAN: 14 mL/min — AB (ref >60)
GFR, EST NON AFRICAN AMERICAN: 12 mL/min — AB (ref >60)
Glucose, Bld: 92 mg/dL (ref 65–99)
POTASSIUM: 4 mmol/L (ref 3.5–5.1)
Phosphorus: 3.3 mg/dL (ref 2.5–4.6)
Sodium: 136 mmol/L (ref 135–145)

## 2016-04-16 LAB — CBC
HEMATOCRIT: 31.2 % — AB (ref 39.0–52.0)
HEMOGLOBIN: 9.5 g/dL — AB (ref 13.0–17.0)
MCH: 30.2 pg (ref 26.0–34.0)
MCHC: 30.4 g/dL (ref 30.0–36.0)
MCV: 99 fL (ref 78.0–100.0)
Platelets: 163 10*3/uL (ref 150–400)
RBC: 3.15 MIL/uL — AB (ref 4.22–5.81)
RDW: 16.7 % — ABNORMAL HIGH (ref 11.5–15.5)
WBC: 9.7 10*3/uL (ref 4.0–10.5)

## 2016-04-16 LAB — PROTIME-INR
INR: 2.68
Prothrombin Time: 29 seconds — ABNORMAL HIGH (ref 11.4–15.2)

## 2016-04-16 MED ORDER — SODIUM THIOSULFATE 25 % IV SOLN
25.0000 g | INTRAVENOUS | Status: DC
  Administered 2016-04-16 – 2016-04-26 (×4): 25 g via INTRAVENOUS
  Filled 2016-04-16 (×8): qty 100

## 2016-04-16 MED ORDER — ALTEPLASE 2 MG IJ SOLR
2.0000 mg | Freq: Once | INTRAMUSCULAR | Status: DC | PRN
  Filled 2016-04-16: qty 2

## 2016-04-16 MED ORDER — ARGATROBAN 50 MG/50ML IV SOLN
0.2500 ug/kg/min | INTRAVENOUS | Status: AC
  Administered 2016-04-17: 0.4 ug/kg/min via INTRAVENOUS
  Filled 2016-04-16: qty 50

## 2016-04-16 MED ORDER — ARGATROBAN 50 MG/50ML IV SOLN
0.5500 ug/kg/min | INTRAVENOUS | Status: DC
  Filled 2016-04-16: qty 50

## 2016-04-16 MED ORDER — LIDOCAINE HCL (PF) 1 % IJ SOLN: 5.0000 mL | INTRAMUSCULAR | Status: DC | PRN

## 2016-04-16 MED ORDER — ARGATROBAN 50 MG/50ML IV SOLN
0.6500 ug/kg/min | INTRAVENOUS | Status: DC
  Administered 2016-04-16: 0.65 ug/kg/min via INTRAVENOUS
  Filled 2016-04-16: qty 50

## 2016-04-16 MED ORDER — SODIUM CHLORIDE 0.9 % IV SOLN: 100.0000 mL | INTRAVENOUS | Status: DC | PRN

## 2016-04-16 MED ORDER — PENTAFLUOROPROP-TETRAFLUOROETH EX AERO: 1.0000 "application " | INHALATION_SPRAY | CUTANEOUS | Status: DC | PRN

## 2016-04-16 MED ORDER — ANTICOAGULANT SODIUM CITRATE 4% (200MG/5ML) IV SOLN
5.0000 mL | Status: DC | PRN
  Administered 2016-04-24: 1.9 mL via INTRAVENOUS_CENTRAL
  Filled 2016-04-16 (×4): qty 250

## 2016-04-16 MED ORDER — LIDOCAINE-PRILOCAINE 2.5-2.5 % EX CREA
1.0000 "application " | TOPICAL_CREAM | CUTANEOUS | Status: DC | PRN
  Filled 2016-04-16: qty 5

## 2016-04-16 MED ORDER — SODIUM THIOSULFATE 25 % IV SOLN: 25.0000 g | INTRAVENOUS | Status: DC

## 2016-04-16 NOTE — Progress Notes (Signed)
[facility] Progress Note  Dialysis Orders: [facility] 3h 55min   87kg   Hep none (allergic to heparin, using citrate)   R IJ cath IV Fe load in progress   Assessment: 1  Dermal necrosis - s/p excision 04/14/16 with wound VAC placement. Probable calciphylaxis, path is pending. For return to OR on Monday, plastic surg will be involved/ consulted.  Wound cx +staph aureus, however the wound was never grossly infected and this is likely skin contaminant, does not need abx.  Have d/w primary.  Will start Na thio.  2  ESRD on HD using TDC. Started HD in [place] in 2004 after aortic dissection/ repair.  3  Paralysis - complications of #4, is WC dependent 4  Hx aortic dissection / repair '04 5  2HPTH on renvela/sensipar - hold VDRA/Ca products with suspected calciphalaxis, also low Ca bath, let Ca ride low (corr Ca < 8-9 max) 6  HTN cont labetalol  7  Anemia of CKD - holding Fe with suspected calciphalaxis  Plan - HD , Na thio, dc abx, back to [facility] MD [place] pager [phone]   04/16/2016, 10:42 AM     Subjective:  C/o of arm pain   Objective Vitals:   04/16/16 0900 04/16/16 0930 04/16/16 1000 04/16/16 1030  BP: (!) 85/51 (!) 88/52 (!) 91/54 110/64  Pulse: 86 85 85 88  Resp:      Temp:      TempSrc:      SpO2: 100% 98% 95% 95%  Weight:      Height:       Physical Exam General: WNWD AAM NAD Heart: RRR Lungs: CTAB  Abdomen: soft NT ND Extremities: L UE with necrotic wound dressed with wound VAC  Dialysis Access: R IJ [facility]   Additional Objective Labs: Basic Metabolic Panel:  Recent Labs Lab 04/14/16 0336 04/15/16 0442 04/16/16 0851  NA 137 138 136  K 3.8 4.6 4.0  CL 98* 96* 97*  CO2 27 28 28   GLUCOSE 91 86 92  BUN 23* 15 14  CREATININE 7.54* 5.90* 5.28*  CALCIUM 8.7* 8.4* 8.6*  PHOS  --  3.8 3.3   Liver Function Tests:  Recent Labs Lab 04/16/16 0851  ALBUMIN 2.7*   No results for input(s): LIPASE, AMYLASE  in the last 168 hours. CBC:  Recent Labs Lab 04/13/16 0937 04/13/16 1858 04/14/16 0336  WBC  --  6.0 5.9  HGB 10.9* 10.3* 9.9*  HCT 32.0* 33.3* 32.3*  MCV  --  97.4 97.6  PLT  --  176 176   Blood Culture    Component Value Date/Time   SDES ABSCESS LEFT UPPER ARM 04/13/2016 1135   SPECREQUEST NONE 04/13/2016 1135   CULT  04/13/2016 1135    ABUNDANT STAPHYLOCOCCUS AUREUS NO ANAEROBES ISOLATED; CULTURE IN PROGRESS FOR 5 DAYS    REPTSTATUS PENDING 04/13/2016 1135    Cardiac Enzymes: No results for input(s): CKTOTAL, CKMB, CKMBINDEX, TROPONINI in the last 168 hours. CBG: No results for input(s): GLUCAP in the last 168 hours. Iron Studies: No results for input(s): IRON, TIBC, TRANSFERRIN, FERRITIN in the last 72 hours. Lab Results  Component Value Date   INR 2.68 04/16/2016   INR 1.93 04/15/2016   INR 2.31 04/14/2016   Medications: . argatroban     .  ceFAZolin (ANCEF) IV  2 g Intravenous Q T,Th,Sat-1800  . cinacalcet  120 mg Oral Q supper  . gabapentin  100 mg  Oral QHS  . labetalol  300 mg Oral BID  . morphine   Intravenous Q4H  . pantoprazole  40 mg Oral Daily  . sevelamer carbonate  800 mg Oral TID WC  . sodium chloride flush  3 mL Intravenous Q12H

## 2016-04-16 NOTE — Progress Notes (Addendum)
ANTICOAGULATION CONSULT NOTE - Follow Up Consult  Pharmacy Consult for Argatroban Indication: atrial fibrillation  Allergies  Allergen Reactions  . Heparin Other (See Comments)    UNSPECIFIED REACTION :  On Coumadin since 2004 ? ? HIT ? ?    Patient Measurements: Height: 6\' 2"  (188 cm) Weight: 188 lb 7.9 oz (85.5 kg) IBW/kg (Calculated) : 82.2  Vital Signs: Temp: 99 F (37.2 C) (01/13 0720) Temp Source: Oral (01/13 0720) BP: 91/54 (01/13 1000) Pulse Rate: 85 (01/13 1000)  Labs:  Recent Labs  04/13/16 1858 04/14/16 0336 04/15/16 0442  04/15/16 1939 04/15/16 2314 04/16/16 0600 04/16/16 0851  HGB 10.3* 9.9*  --   --   --   --   --   --   HCT 33.3* 32.3*  --   --   --   --   --   --   PLT 176 176  --   --   --   --   --   --   APTT  --   --   --   < > 50* 134* 112*  --   LABPROT 24.5* 25.8* 22.4*  --   --   --  29.0*  --   INR 2.17 2.31 1.93  --   --   --  2.68  --   CREATININE  --  7.54* 5.90*  --   --   --   --  5.28*  < > = values in this interval not displayed.  Estimated Creatinine Clearance: 20.5 mL/min (by C-G formula based on SCr of 5.28 mg/dL (H)).  Medications: Argatroban @ 0.28mcg/kg/min  Assessment: 45yom on coumadin PTA for hx Afib (CHADsVASc = 1), admitted 1/10 for left upper arm I&D. Coumadin held and pharmacy asked to bridge with argatroban given hx HIT. Unable to find anything to indicate patient had HIT besides an allergy that is not well documented and patient unaware of heparin allergy. Argatroban bridge started yesterday. APTT remains elevated at 112 seconds.   Goal of Therapy:  aPTT 50-90 seconds Monitor platelets by anticoagulation protocol: Yes   Plan:  1) Decrease argatroban to 0.66mcg/kg/min 2) Check aPTT in 4 hours  Corey [name], Corey [name] 04/16/2016,10:32 AM    ADDENDUM: APTT remains elevated at 122 seconds despite rate decrease earlier today. Decrease argatroban further to 0.73mcg/kg/min and check another aPTT in 4 hours.  Corey [name], Corey [name] 04/16/2016, 6:19 PM

## 2016-04-16 NOTE — Progress Notes (Signed)
ANTICOAGULATION CONSULT NOTE - Follow Up Consult  Pharmacy Consult for argatroban Indication: atrial fibrillation  Labs:  Recent Labs  04/13/16 0937 04/13/16 1858 04/14/16 0336 04/15/16 0442 04/15/16 1441 04/15/16 1939 04/15/16 2314  HGB 10.9* 10.3* 9.9*  --   --   --   --   HCT 32.0* 33.3* 32.3*  --   --   --   --   PLT  --  176 176  --   --   --   --   APTT  --   --   --   --  130* 50* 134*  LABPROT  --  24.5* 25.8* 22.4*  --   --   --   INR  --  2.17 2.31 1.93  --   --   --   CREATININE  --   --  7.54* 5.90*  --   --   --     Assessment: 46yo male now well above goal on argatroban after rate increase; RN notes that pt is a very difficult stick and has had many painful encounters for lab monitoring.  Goal of Therapy:  aPTT 50-90 seconds   Plan:  Will hold argatroban x30 min then resume at lower rate of 0.4mcg/kg/min and check PTT in 4hr (pt prefers draw at HD).  Wynona Neat, PharmD, BCPS  04/16/2016,1:33 AM

## 2016-04-16 NOTE — Progress Notes (Signed)
ANTICOAGULATION CONSULT NOTE - Follow Up Consult  Pharmacy Consult for Argatroban Indication: atrial fibrillation  Allergies  Allergen Reactions  . Heparin Other (See Comments)    UNSPECIFIED REACTION :  On Coumadin since 2004 ? ? HIT ? ?    Patient Measurements: Height: 6\' 2"  (188 cm) Weight: 188 lb 7.9 oz (85.5 kg) IBW/kg (Calculated) : 82.2  Vital Signs: Temp: 99.5 F (37.5 C) (01/13 2045) Temp Source: Oral (01/13 2045) BP: 117/66 (01/13 2045) Pulse Rate: 92 (01/13 2045)  Labs:  Recent Labs  04/14/16 0336 04/15/16 0442  04/16/16 0600 04/16/16 0851 04/16/16 1054 04/16/16 1540 04/16/16 2257  HGB 9.9*  --   --   --   --  9.5*  --   --   HCT 32.3*  --   --   --   --  31.2*  --   --   PLT 176  --   --   --   --  163  --   --   APTT  --   --   < > 112*  --   --  112* 82*  LABPROT 25.8* 22.4*  --  29.0*  --   --   --   --   INR 2.31 1.93  --  2.68  --   --   --   --   CREATININE 7.54* 5.90*  --   --  5.28*  --   --   --   < > = values in this interval not displayed.  Estimated Creatinine Clearance: 20.5 mL/min (by C-G formula based on SCr of 5.28 mg/dL (H)).  Medications: Argatroban @ 0.55mcg/kg/min  Assessment: 46 yo M on coumadin PTA for hx Afib (CHADsVASc = 1), admitted 1/10 for left upper arm I&D. Coumadin held and pharmacy asked to bridge with argatroban given hx HIT. Unable to find anything to indicate patient had HIT besides an allergy that is not well documented and patient unaware of heparin allergy. APTT now therapeutic at 82 seconds.   Goal of Therapy:  aPTT 50-90 seconds Monitor platelets by anticoagulation protocol: Yes   Plan:  1) Continue argatroban to 0.4 mcg/kg/min 2) Next aPTT with AM labs.  [employer], Pharm.D., BCPS Clinical Pharmacist Pager [phone] 04/16/2016 11:46 PM

## 2016-04-16 NOTE — Progress Notes (Addendum)
  [facility] Progress Note  Subjective  - POD #3  Patient seen in HD. Complaining of pain left arm  Objective Vitals:   04/16/16 0900 04/16/16 0930  BP: (!) 85/51 (!) 88/52  Pulse: 86 85  Resp:    Temp:      Intake/Output Summary (Last 24 hours) at 04/16/16 0959 Last data filed at 04/15/16 2300  Gross per 24 hour  Intake              480 ml  Output                0 ml  Net              480 ml   Left arm VAC seal intact.  Superficial skin abrasion distal to VAC dressing is clean. Palpable thrill left upper arm fistula.   Assessment/Planning: 46 y.o. male is s/p: irrigation and excision dead skin left upper arm, placement of negative pressure dressing 3 Days Post-Op   -Surgical pathology still pending. Wound is likely calciphylaxis. Wound did not appear to be infected. Staph aureus on wound culture is likely contaminant. Will d/c antibiotics. Had discussion with Dr. Jonnie Finner who will consider starting sodium thiosulfate.  -Will take down VAC Monday in OR with plastic surgery.  -Continue argatroban and hold coumadin.  [name] 04/16/2016 9:59 AM --  Laboratory CBC    Component Value Date/Time   WBC 5.9 04/14/2016 0336   HGB 9.9 (L) 04/14/2016 0336   HCT 32.3 (L) 04/14/2016 0336   PLT 176 04/14/2016 0336    BMET    Component Value Date/Time   NA 136 04/16/2016 0851   K 4.0 04/16/2016 0851   CL 97 (L) 04/16/2016 0851   CO2 28 04/16/2016 0851   GLUCOSE 92 04/16/2016 0851   BUN 14 04/16/2016 0851   CREATININE 5.28 (H) 04/16/2016 0851   CALCIUM 8.6 (L) 04/16/2016 0851   GFRNONAA 12 (L) 04/16/2016 0851   GFRAA 14 (L) 04/16/2016 0851    COAG Lab Results  Component Value Date   INR 1.93 04/15/2016   INR 2.31 04/14/2016   INR 2.17 04/13/2016   No results found for: PTT  Antibiotics Anti-infectives    Start     Dose/Rate Route Frequency Ordered Stop   04/16/16 1800  ceFAZolin (ANCEF) IVPB 2g/100 mL premix    Comments:  Send with  pt to OR   2 g 200 mL/hr over 30 Minutes Intravenous Every T-Th-Sa (1800) 04/14/16 1425     04/14/16 1430  ceFAZolin (ANCEF) IVPB 1 g/50 mL premix     1 g 100 mL/hr over 30 Minutes Intravenous  Once 04/14/16 1425 04/14/16 1759   04/13/16 0902  cefUROXime (ZINACEF) 1.5 g in dextrose 5 % 50 mL IVPB     1.5 g 100 mL/hr over 30 Minutes Intravenous 30 min pre-op 04/13/16 0902 04/13/16 [place], PA-C [facility] Office: [phone] Pager: [phone] 04/16/2016 9:59 AM   I have independently interviewed patient and agree with PA assessment and plan above.   Brandon C. Donzetta Matters, MD [facility] [place] Office: [phone] Pager: [phone]

## 2016-04-17 LAB — CBC
HEMATOCRIT: 33.2 % — AB (ref 39.0–52.0)
HEMOGLOBIN: 10.1 g/dL — AB (ref 13.0–17.0)
MCH: 29.9 pg (ref 26.0–34.0)
MCHC: 30.4 g/dL (ref 30.0–36.0)
MCV: 98.2 fL (ref 78.0–100.0)
Platelets: 198 10*3/uL (ref 150–400)
RBC: 3.38 MIL/uL — AB (ref 4.22–5.81)
RDW: 16.7 % — ABNORMAL HIGH (ref 11.5–15.5)
WBC: 9.5 10*3/uL (ref 4.0–10.5)

## 2016-04-17 LAB — APTT
aPTT: 98 seconds — ABNORMAL HIGH (ref 24–36)
aPTT: 121 seconds — ABNORMAL HIGH (ref 24–36)

## 2016-04-17 LAB — PROTIME-INR
INR: 2.95
Prothrombin Time: 31.4 seconds — ABNORMAL HIGH (ref 11.4–15.2)

## 2016-04-17 MED ORDER — ANTICOAGULANT SODIUM CITRATE 4% (200MG/5ML) IV SOLN
5.0000 mL | Status: DC | PRN
  Administered 2016-04-17: 5 mL
  Administered 2016-04-18 (×2): 1.9 mL
  Administered 2016-04-20: 3.8 mL
  Administered 2016-04-21: 5 mL
  Administered 2016-04-21 – 2016-04-26 (×3): 1.9 mL
  Filled 2016-04-17 (×19): qty 250

## 2016-04-17 NOTE — Progress Notes (Addendum)
  [facility] Progress Note  Subjective   Pain left arm  Objective Vitals:   04/17/16 0759 04/17/16 0800  BP:    Pulse:    Resp: 18 18  Temp:      Intake/Output Summary (Last 24 hours) at 04/17/16 0826 Last data filed at 04/17/16 0539  Gross per 24 hour  Intake                0 ml  Output             -301 ml  Net              301 ml   Left arm VAC dressing intact. Palpable thrill left upper arm fistula Palpable left radial pulse  Assessment/Planning: 46 y.o. male is s/p: irrigation and excision dead skin left upper arm, placement of negative pressure dressing 4 Days Post-Op   OR tomorrow with plastics to evaluate wound.  Stop argatroban at midnight. Sodium thiosulfate started per renal for suspected calciphylaxis. Surgical pathology still pending.  PCA for pain.   [name] 04/17/2016 8:26 AM --  Laboratory CBC    Component Value Date/Time   WBC 9.5 04/17/2016 0230   HGB 10.1 (L) 04/17/2016 0230   HCT 33.2 (L) 04/17/2016 0230   PLT 198 04/17/2016 0230    BMET    Component Value Date/Time   NA 136 04/16/2016 0851   K 4.0 04/16/2016 0851   CL 97 (L) 04/16/2016 0851   CO2 28 04/16/2016 0851   GLUCOSE 92 04/16/2016 0851   BUN 14 04/16/2016 0851   CREATININE 5.28 (H) 04/16/2016 0851   CALCIUM 8.6 (L) 04/16/2016 0851   GFRNONAA 12 (L) 04/16/2016 0851   GFRAA 14 (L) 04/16/2016 0851    COAG Lab Results  Component Value Date   INR 2.95 04/17/2016   INR 2.68 04/16/2016   INR 1.93 04/15/2016   No results found for: PTT  Antibiotics Anti-infectives    Start     Dose/Rate Route Frequency Ordered Stop   04/16/16 1800  ceFAZolin (ANCEF) IVPB 2g/100 mL premix  Status:  Discontinued    Comments:  Send with pt to OR   2 g 200 mL/hr over 30 Minutes Intravenous Every T-Th-Sa (1800) 04/14/16 1425 04/16/16 1314   04/14/16 1430  ceFAZolin (ANCEF) IVPB 1 g/50 mL premix     1 g 100 mL/hr over 30 Minutes Intravenous  Once 04/14/16 1425  04/14/16 1759   04/13/16 0902  cefUROXime (ZINACEF) 1.5 g in dextrose 5 % 50 mL IVPB     1.5 g 100 mL/hr over 30 Minutes Intravenous 30 min pre-op 04/13/16 0902 04/13/16 Grant, PA-C [facility] Office: [phone] Pager: [phone] 04/17/2016 8:26 AM   I have nterviewed patient with PA and agree with assessment and plan above. INR falsely elevated with agatroban with therapeutic ptt. Hold anticoagulation tonight in preparation for surgery tomorrow with plastic surgery.  Aidan Caloca C. Donzetta Matters, MD [facility] [place] Office: [phone] Pager: [phone]

## 2016-04-17 NOTE — Progress Notes (Addendum)
ANTICOAGULATION CONSULT NOTE - Follow Up Consult  Pharmacy Consult for Argatroban Indication: atrial fibrillation  Allergies  Allergen Reactions  . Heparin Other (See Comments)    UNSPECIFIED REACTION :  On Coumadin since 2004 ? ? HIT ? ?    Patient Measurements: Height: 6\' 2"  (188 cm) Weight:  (refused not feeling well) IBW/kg (Calculated) : 82.2  Vital Signs: Temp: 99.1 F (37.3 C) (01/14 0557) Temp Source: Oral (01/14 0557) BP: 101/62 (01/14 0557) Pulse Rate: 89 (01/14 0557)  Labs:  Recent Labs  04/15/16 0442  04/16/16 0600 04/16/16 0851 04/16/16 1054 04/16/16 1540 04/16/16 2257 04/17/16 0230 04/17/16 0559  HGB  --   --   --   --  9.5*  --   --  10.1*  --   HCT  --   --   --   --  31.2*  --   --  33.2*  --   PLT  --   --   --   --  163  --   --  198  --   APTT  --   < > 112*  --   --  112* 82*  --  98*  LABPROT 22.4*  --  29.0*  --   --   --   --  31.4*  --   INR 1.93  --  2.68  --   --   --   --  2.95  --   CREATININE 5.90*  --   --  5.28*  --   --   --   --   --   < > = values in this interval not displayed.  Estimated Creatinine Clearance: 20.5 mL/min (by C-G formula based on SCr of 5.28 mg/dL (H)).  Medications: Argatroban @ 0.2mcg/kg/min  Assessment: 46 yo M on coumadin PTA for hx Afib (CHADsVASc = 1), admitted 1/10 for left upper arm I&D. Coumadin held and pharmacy asked to bridge with argatroban given hx HIT. Unable to find anything to indicate patient had HIT besides an allergy that is not well documented and patient unaware of heparin allergy. APTT elevated this AM at 98 seconds.   Goal of Therapy:  aPTT 50-90 seconds Monitor platelets by anticoagulation protocol: Yes   Plan:  1) Decrease argatroban to 0.32 mcg/kg/min 2) Check aPTT in 2 hours   Gwenlyn Perking, PharmD PGY1 Pharmacy Resident Pager: [phone] 04/17/2016 8:01 AM     ADDENDUM: Lab having difficulty drawing next aPTT. Since argatroban to be turned off at midnight, will  discontinue any further aPTT lab draws and continue argatroban at 0.53mcg/kg/min.  Nena Jordan, PharmD, BCPS 04/17/2016, 12:16 PM    ADDENDUM #2: Lab was actually able to draw the aPTT. Resulted at 121 seconds which increased from this morning despite rate decrease. Will decrease argatroban to 0.68mcg/kg/min and add stop time for midnight tonight prior to OR tomorrow. Will not check any further aPTTs.  Nena Jordan, PharmD, BCPS 04/17/2016, 1:55 PM

## 2016-04-17 NOTE — Progress Notes (Signed)
[facility] Progress Note  Dialysis Orders: [facility] 3h 62min   87kg   Hep none (allergic to heparin, using citrate)   R IJ cath IV Fe load in progress   Assessment: 1  Dermal necrosis - s/p excision 04/14/16 with wound VAC placement. Probable calciphylaxis, path is pending. For return to OR on Monday, and plastic surg is evaluating for options. Wound cx +staph aureus, however the wound was never grossly infected and this is likely contaminant. Abx have been stopped. Started Na thio tiw with HD.   2  ESRD on HD using TDC. Started HD in [place] in 2004 after aortic dissection/ repair.  3  Paralysis - complications of #4, is WC dependent 4  Hx aortic dissection / repair '04 5  2HPTH on renvela/sensipar - holding VDRA/Ca products with suspected calciphalaxis, also low Ca bath, keep serum CA on low side (corr Ca 8-9 max). Na thio tiw.  6  HTN cont labetalol  7  Anemia of CKD - holding Fe with suspected calciphylaxis 8  Difficult lab draw - wrote DBIV order to use HD cath for now.  Plan - HD Tuesday, pain meds, Na thio.    Kelly Splinter MD [facility] pager [phone]   04/17/2016, 7:27 AM     Subjective: on dilaudid PCA, having trouble getting blood draws.    Objective Vitals:   04/16/16 2045 04/17/16 0000 04/17/16 0400 04/17/16 0557  BP: 117/66   101/62  Pulse: 92   89  Resp:  (!) 9 16 18   Temp: 99.5 F (37.5 C)   99.1 F (37.3 C)  TempSrc: Oral   Oral  SpO2: 96% 94% 93% 98%  Weight:      Height:       Physical Exam General: WNWD AAM NAD Heart: RRR Lungs: CTAB  Abdomen: soft NT ND Extremities: L UE with necrotic wound dressed with wound VAC  Dialysis Access: R IJ [facility]   Additional Objective Labs: Basic Metabolic Panel:  Recent Labs Lab 04/14/16 0336 04/15/16 0442 04/16/16 0851  NA 137 138 136  K 3.8 4.6 4.0  CL 98* 96* 97*  CO2 27 28 28   GLUCOSE 91 86 92  BUN 23* 15 14  CREATININE 7.54* 5.90* 5.28*  CALCIUM 8.7* 8.4* 8.6*   PHOS  --  3.8 3.3   Liver Function Tests:  Recent Labs Lab 04/16/16 0851  ALBUMIN 2.7*   No results for input(s): LIPASE, AMYLASE in the last 168 hours. CBC:  Recent Labs Lab 04/13/16 1858 04/14/16 0336 04/16/16 1054 04/17/16 0230  WBC 6.0 5.9 9.7 9.5  HGB 10.3* 9.9* 9.5* 10.1*  HCT 33.3* 32.3* 31.2* 33.2*  MCV 97.4 97.6 99.0 98.2  PLT 176 176 163 198   Blood Culture    Component Value Date/Time   SDES ABSCESS LEFT UPPER ARM 04/13/2016 1135   SPECREQUEST NONE 04/13/2016 1135   CULT  04/13/2016 1135    ABUNDANT STAPHYLOCOCCUS AUREUS RARE YEAST CULTURE REINCUBATED FOR BETTER GROWTH NO ANAEROBES ISOLATED; CULTURE IN PROGRESS FOR 5 DAYS    REPTSTATUS PENDING 04/13/2016 1135    Cardiac Enzymes: No results for input(s): CKTOTAL, CKMB, CKMBINDEX, TROPONINI in the last 168 hours. CBG: No results for input(s): GLUCAP in the last 168 hours. Iron Studies: No results for input(s): IRON, TIBC, TRANSFERRIN, FERRITIN in the last 72 hours. Lab Results  Component Value Date   INR 2.95 04/17/2016   INR 2.68 04/16/2016   INR 1.93 04/15/2016   Medications: . argatroban  0.4 mcg/kg/min (04/17/16 0533)   . cinacalcet  120 mg Oral Q supper  . gabapentin  100 mg Oral QHS  . morphine   Intravenous Q4H  . pantoprazole  40 mg Oral Daily  . sevelamer carbonate  800 mg Oral TID WC  . sodium chloride flush  3 mL Intravenous Q12H  . sodium thiosulfate infusion for calciphylaxis  25 g Intravenous Q T,Th,Sa-HD

## 2016-04-18 ENCOUNTER — Inpatient Hospital Stay (HOSPITAL_COMMUNITY): Payer: Medicare Other | Admitting: Anesthesiology

## 2016-04-18 ENCOUNTER — Encounter (HOSPITAL_COMMUNITY)
Admission: RE | Disposition: A | Discharge status: Home Health | Payer: Self-pay | Source: Ambulatory Visit | Attending: Vascular Surgery

## 2016-04-18 HISTORY — PX: APPLICATION OF WOUND VAC: SHX5189

## 2016-04-18 LAB — BASIC METABOLIC PANEL
Anion gap: 17 — ABNORMAL HIGH (ref 5–15)
BUN: 30 mg/dL — AB (ref 6–20)
CHLORIDE: 95 mmol/L — AB (ref 101–111)
CO2: 26 mmol/L (ref 22–32)
CREATININE: 6.76 mg/dL — AB (ref 0.61–1.24)
Calcium: 8 mg/dL — ABNORMAL LOW (ref 8.9–10.3)
GFR calc Af Amer: 10 mL/min — ABNORMAL LOW (ref >60)
GFR calc non Af Amer: 9 mL/min — ABNORMAL LOW (ref >60)
GLUCOSE: 81 mg/dL (ref 65–99)
POTASSIUM: 4 mmol/L (ref 3.5–5.1)
Sodium: 138 mmol/L (ref 135–145)

## 2016-04-18 LAB — PROTIME-INR
INR: 2.64
Prothrombin Time: 28.7 seconds — ABNORMAL HIGH (ref 11.4–15.2)

## 2016-04-18 LAB — AEROBIC/ANAEROBIC CULTURE W GRAM STAIN (SURGICAL/DEEP WOUND)

## 2016-04-18 LAB — CBC
HEMATOCRIT: 27.6 % — AB (ref 39.0–52.0)
Hemoglobin: 8.6 g/dL — ABNORMAL LOW (ref 13.0–17.0)
MCH: 30.2 pg (ref 26.0–34.0)
MCHC: 31.2 g/dL (ref 30.0–36.0)
MCV: 96.8 fL (ref 78.0–100.0)
PLATELETS: 154 10*3/uL (ref 150–400)
RBC: 2.85 MIL/uL — ABNORMAL LOW (ref 4.22–5.81)
RDW: 16.6 % — AB (ref 11.5–15.5)
WBC: 5.6 10*3/uL (ref 4.0–10.5)

## 2016-04-18 LAB — APTT
APTT: 78 s — AB (ref 24–36)
aPTT: 86 seconds — ABNORMAL HIGH (ref 24–36)

## 2016-04-18 SURGERY — APPLICATION, WOUND VAC
Anesthesia: General | Site: Arm Upper

## 2016-04-18 MED ORDER — PHENYLEPHRINE HCL 10 MG/ML IJ SOLN
INTRAMUSCULAR | Status: DC | PRN
  Administered 2016-04-18: 160 ug via INTRAVENOUS
  Administered 2016-04-18 (×2): 120 ug via INTRAVENOUS

## 2016-04-18 MED ORDER — PROPOFOL 10 MG/ML IV BOLUS
INTRAVENOUS | Status: AC
  Filled 2016-04-18: qty 20

## 2016-04-18 MED ORDER — PROPOFOL 10 MG/ML IV BOLUS
INTRAVENOUS | Status: DC | PRN
  Administered 2016-04-18: 130 mg via INTRAVENOUS

## 2016-04-18 MED ORDER — SODIUM CHLORIDE 0.9% FLUSH
10.0000 mL | INTRAVENOUS | Status: DC | PRN
  Administered 2016-04-18 – 2016-04-24 (×4): 10 mL
  Filled 2016-04-18 (×4): qty 40

## 2016-04-18 MED ORDER — ARGATROBAN 50 MG/50ML IV SOLN
0.2500 ug/kg/min | INTRAVENOUS | Status: DC
  Administered 2016-04-18 – 2016-04-19 (×2): 0.25 ug/kg/min via INTRAVENOUS
  Filled 2016-04-18 (×3): qty 50

## 2016-04-18 MED ORDER — MIDAZOLAM HCL 2 MG/2ML IJ SOLN
INTRAMUSCULAR | Status: AC
  Filled 2016-04-18: qty 2

## 2016-04-18 MED ORDER — CEFAZOLIN SODIUM 1 G IJ SOLR
INTRAMUSCULAR | Status: DC | PRN
  Administered 2016-04-18: 2 g via INTRAMUSCULAR

## 2016-04-18 MED ORDER — FENTANYL CITRATE (PF) 100 MCG/2ML IJ SOLN
INTRAMUSCULAR | Status: AC
  Filled 2016-04-18: qty 2

## 2016-04-18 MED ORDER — 0.9 % SODIUM CHLORIDE (POUR BTL) OPTIME
TOPICAL | Status: DC | PRN
  Administered 2016-04-18: 1000 mL

## 2016-04-18 MED ORDER — MIDAZOLAM HCL 2 MG/2ML IJ SOLN
INTRAMUSCULAR | Status: DC | PRN
  Administered 2016-04-18: 0.5 mg via INTRAVENOUS

## 2016-04-18 MED ORDER — SODIUM CHLORIDE 0.9 % IV SOLN
INTRAVENOUS | Status: DC | PRN
  Administered 2016-04-18: 08:00:00 via INTRAVENOUS

## 2016-04-18 MED ORDER — EPHEDRINE SULFATE 50 MG/ML IJ SOLN
INTRAMUSCULAR | Status: DC | PRN
  Administered 2016-04-18 (×2): 10 mg via INTRAVENOUS

## 2016-04-18 MED ORDER — FENTANYL CITRATE (PF) 100 MCG/2ML IJ SOLN
INTRAMUSCULAR | Status: DC | PRN
  Administered 2016-04-18: 100 ug via INTRAVENOUS

## 2016-04-18 SURGICAL SUPPLY — 21 items
CANISTER SUCTION 2500CC (MISCELLANEOUS) ×4 IMPLANT
CANISTER WOUND CARE 500ML ATS (WOUND CARE) ×4 IMPLANT
COVER SURGICAL LIGHT HANDLE (MISCELLANEOUS) ×4 IMPLANT
DRAPE HALF SHEET 40X57 (DRAPES) ×4 IMPLANT
DRSG VAC ATS MED SENSATRAC (GAUZE/BANDAGES/DRESSINGS) ×4 IMPLANT
ELECT COATED BLADE 2.86 ST (ELECTRODE) ×4 IMPLANT
ELECT REM PT RETURN 9FT ADLT (ELECTROSURGICAL) ×4
ELECTRODE REM PT RTRN 9FT ADLT (ELECTROSURGICAL) ×3 IMPLANT
GLOVE BIO SURGEON STRL SZ7.5 (GLOVE) ×4 IMPLANT
GOWN STRL REUS W/ TWL LRG LVL3 (GOWN DISPOSABLE) ×3 IMPLANT
GOWN STRL REUS W/ TWL XL LVL3 (GOWN DISPOSABLE) ×3 IMPLANT
GOWN STRL REUS W/TWL LRG LVL3 (GOWN DISPOSABLE) ×1
GOWN STRL REUS W/TWL XL LVL3 (GOWN DISPOSABLE) ×1
KIT BASIN OR (CUSTOM PROCEDURE TRAY) ×4 IMPLANT
KIT ROOM TURNOVER OR (KITS) ×4 IMPLANT
NS IRRIG 1000ML POUR BTL (IV SOLUTION) ×4 IMPLANT
PACK GENERAL/GYN (CUSTOM PROCEDURE TRAY) ×4 IMPLANT
PAD ARMBOARD 7.5X6 YLW CONV (MISCELLANEOUS) ×4 IMPLANT
SYR BULB IRRIGATION 50ML (SYRINGE) ×4 IMPLANT
TOWEL OR 17X24 6PK STRL BLUE (TOWEL DISPOSABLE) ×4 IMPLANT
UNDERPAD 30X30 (UNDERPADS AND DIAPERS) ×4 IMPLANT

## 2016-04-18 NOTE — Transfer of Care (Signed)
Immediate Anesthesia Transfer of Care Note  Patient: Corey [name]  Procedure(s) Performed: Procedure(s) with comments: APPLICATION OF WOUND VAC (Left) - Wound vac change   Patient Location: PACU  Anesthesia Type:General  Level of Consciousness: sedated, patient cooperative and responds to stimulation  Airway & Oxygen Therapy: Patient connected to nasal cannula oxygen  Post-op Assessment: Report given to RN and Post -op Vital signs reviewed and stable  Post vital signs: Reviewed and stable  Last Vitals:  Vitals:   04/18/16 0747 04/18/16 0748  BP:    Pulse:    Resp: 13 13  Temp:      Last Pain:  Vitals:   04/18/16 0748  TempSrc:   PainSc: 7       Patients Stated Pain Goal: 7 (76/18/48 5927)  Complications: No apparent anesthesia complications

## 2016-04-18 NOTE — Progress Notes (Signed)
  Progress Note    04/18/2016 7:58 AM Day of Surgery  Subjective: stable pain in left arm  Vitals:   04/18/16 0747 04/18/16 0748  BP:    Pulse:    Resp: 13 13  Temp:      Physical Exam: [id number] Non labored respirations Left arm with palpable left radial pulse and wound vac to suction  CBC    Component Value Date/Time   WBC 5.6 04/18/2016 0430   RBC 2.85 (L) 04/18/2016 0430   HGB 8.6 (L) 04/18/2016 0430   HCT 27.6 (L) 04/18/2016 0430   PLT 154 04/18/2016 0430   MCV 96.8 04/18/2016 0430   MCH 30.2 04/18/2016 0430   MCHC 31.2 04/18/2016 0430   RDW 16.6 (H) 04/18/2016 0430    BMET    Component Value Date/Time   NA 138 04/18/2016 0430   K 4.0 04/18/2016 0430   CL 95 (L) 04/18/2016 0430   CO2 26 04/18/2016 0430   GLUCOSE 81 04/18/2016 0430   BUN 30 (H) 04/18/2016 0430   CREATININE 6.76 (H) 04/18/2016 0430   CALCIUM 8.0 (L) 04/18/2016 0430   GFRNONAA 9 (L) 04/18/2016 0430   GFRAA 10 (L) 04/18/2016 0430    INR    Component Value Date/Time   INR 2.64 04/18/2016 0430     Intake/Output Summary (Last 24 hours) at 04/18/16 0758 Last data filed at 04/18/16 0539  Gross per 24 hour  Intake                9 ml  Output               50 ml  Net              -41 ml     Assessment:  46 y.o. male is s/p debridement of left upper arm wound from avf placement.   Plan: OR today for wound vac change and evaluation by plastic surgery    Corey [name] C. Donzetta Matters, MD [facility] [place] Office: [phone] Pager: [phone]  04/18/2016 7:58 AM

## 2016-04-18 NOTE — Progress Notes (Signed)
Argatroban stopped per order

## 2016-04-18 NOTE — Op Note (Signed)
    Patient name: Corey [name] MRN: [id number] DOB: 03-05-1971 Sex: male  04/18/2016 Pre-operative Diagnosis: 1. esrd 2. Left upper arm wound Post-operative diagnosis:  Same Surgeon:  Erlene Quan C. Donzetta Matters, MD Co-Surgeon: Irene Limbo, MD Assistant: OR nurse Procedure Performed: examination under anesthesia of left arm wound vac and placement of negative pressure dressing  Indications:  46 year old male with end-stage renal disease status post left arm basilic vein transposition with now wound overlying the fistula. He has had wound VAC placed and is now indicated for wound VAC change evaluation for possible skin graft by plastic surgery.  Findings: The wound measures 10 cm x 8 cm in greatest dimension. There is formation of granulation tissue at the edges without any need for debridement of tissue. There does not appear to be spreading of the calciphylaxis at the edges. Wound VAC was placed to 125 mmHg.   Procedure:  The patient was identified in the holding area and taken to the operating room where he was placed supine and LMA anesthesia induced. Along with Dr. Iran Planas the wound VAC was removed and wound examined. As noted above there was granulation with bleeding. The wound was then prepped with Betadine paint timeout called and a new wound VAC placed without palpitation. Patient did tolerate procedure well is okay to continue dialysis via his catheter and will possibly need skin grafting in the future at the discretion of Dr. Iran Planas.   Imanni Burdine C. Donzetta Matters, MD [facility] [place] Office: [phone] Pager: [phone]

## 2016-04-18 NOTE — Progress Notes (Signed)
ANTICOAGULATION CONSULT NOTE - Follow Up Consult  Pharmacy Consult for Argatroban Indication: atrial fibrillation  Allergies  Allergen Reactions  . Heparin Other (See Comments)    UNSPECIFIED REACTION :  On Coumadin since 2004 ? ? HIT ? ?    Patient Measurements: Height: 6\' 2"  (188 cm) Weight:  (refused not feeling well) IBW/kg (Calculated) : 82.2  Vital Signs: Temp: 98.1 F (36.7 C) (01/15 1331) Temp Source: Oral (01/15 1331) BP: 92/53 (01/15 1331) Pulse Rate: 71 (01/15 1331)  Labs:  Recent Labs  04/16/16 0600 04/16/16 0851  04/16/16 1054  04/17/16 0230 04/17/16 0559 04/17/16 1240 04/18/16 0430 04/18/16 1721  HGB  --   --   < > 9.5*  --  10.1*  --   --  8.6*  --   HCT  --   --   --  31.2*  --  33.2*  --   --  27.6*  --   PLT  --   --   --  163  --  198  --   --  154  --   APTT 112*  --   --   --   < >  --  98* 121*  --  86*  LABPROT 29.0*  --   --   --   --  31.4*  --   --  28.7*  --   INR 2.68  --   --   --   --  2.95  --   --  2.64  --   CREATININE  --  5.28*  --   --   --   --   --   --  6.76*  --   < > = values in this interval not displayed.  Estimated Creatinine Clearance: 16 mL/min (by C-G formula based on SCr of 6.76 mg/dL (H)).  Medications: Argatroban @ 0.58mcg/kg/min  Assessment: 46 yo M on coumadin PTA for hx Afib (CHADsVASc = 1), admitted 1/10 for left upper arm I&D. Coumadin held and pharmacy asked to bridge with argatroban given hx HIT. Unable to find anything to indicate patient had HIT besides an allergy that is not well documented and patient unaware of heparin allergy.   Argatroban restarted today post-op and first aPTT level after restart is in range at 86 seconds.  No excessive bleeding noted.  Goal of Therapy:  aPTT 50-90 seconds Monitor platelets by anticoagulation protocol: Yes   Plan:  -continue argatroban at 0.4mcg/kg/min -confirmatory aPTT in 4h -daily aPTT and CBC   Kwamaine Cuppett D. Jahseh Lucchese, PharmD, BCPS Clinical  Pharmacist Pager: [phone] 04/18/2016 6:30 PM

## 2016-04-18 NOTE — Progress Notes (Addendum)
   Discussed with patient again given size of wound anticipate skin graft but would defer this until wound bed granulated. Discussed skin substitutes to hasten healing but would still need skin graft, patient unsure of this and of prolonged use VAC- will defer any placement today of skin substitute and plan grafting given wound appearance.  Irene Limbo, MD [facility] [phone], pin [phone]    ADDENDUM 0836  Wound examined. There are two additional wounds forearm and posterior arm- not sure if these were present previously or new. The upper arm wound has increased granulation, approximately 60% with some central slough. No infection grossly noted.  Fistula lateral and superior to wound.   Path pending. Recommend VAC while inpatient, if able to discharge consider home [facility] with home health though the patient may not be able to to [facility] changes as he reported significant pain with last bedside change. If VAC not tolerated recommend silver alginate to wound every other day and cover with dry dressing.  The progress made over weekend suggests wound would be ready for grafting within week. However if calciphylaxis, he would wound have additional wound donor site that may not heal. Will discuss with Dr. [name] tomorrow.

## 2016-04-18 NOTE — Progress Notes (Signed)
[facility] ROUNDING NOTE   Subjective:   Interval History:  Still with pain over AVG   Objective:  Vital signs in last 24 hours:  Temp:  [97.3 F (36.3 C)-97.9 F (36.6 C)] 97.4 F (36.3 C) (01/15 0954) Pulse Rate:  [66-75] 70 (01/15 0954) Resp:  [8-21] 16 (01/15 1114) BP: (88-101)/(56-74) 97/59 (01/15 0954) SpO2:  [93 %-100 %] 98 % (01/15 1114)  Weight change:  Filed Weights   04/14/16 1155 04/16/16 0720 04/16/16 1212  Weight: 86.8 kg (191 lb 5.8 oz) 85.5 kg (188 lb 7.9 oz) 85.5 kg (188 lb 7.9 oz)    Intake/Output: I/O last 3 completed shifts: In: 9 [I.V.:9] Out: 57 [Drains:50]   Intake/Output this shift:  Total I/O In: 103 [I.V.:103] Out: 2 [Blood:2]  Dialysis Orders: [facility] 3h 31min 87kg Hep none (allergic to heparin, using citrate) R IJ cath IV Fe load in progress   Basic Metabolic Panel:  Recent Labs Lab 04/13/16 0937  04/14/16 0336 04/15/16 0442 04/16/16 0851 04/18/16 0430  NA 137  --  137 138 136 138  K 3.4*  --  3.8 4.6 4.0 4.0  CL  --   --  98* 96* 97* 95*  CO2  --   --  27 28 28 26   GLUCOSE 97  --  91 86 92 81  BUN  --   --  23* 15 14 30*  CREATININE  --   --  7.54* 5.90* 5.28* 6.76*  CALCIUM  --   < > 8.7* 8.4* 8.6* 8.0*  PHOS  --   --   --  3.8 3.3  --   < > = values in this interval not displayed.  Liver Function Tests:  Recent Labs Lab 04/16/16 0851  ALBUMIN 2.7*   No results for input(s): LIPASE, AMYLASE in the last 168 hours. No results for input(s): AMMONIA in the last 168 hours.  CBC:  Recent Labs Lab 04/13/16 1858 04/14/16 0336 04/16/16 1054 04/17/16 0230 04/18/16 0430  WBC 6.0 5.9 9.7 9.5 5.6  HGB 10.3* 9.9* 9.5* 10.1* 8.6*  HCT 33.3* 32.3* 31.2* 33.2* 27.6*  MCV 97.4 97.6 99.0 98.2 96.8  PLT 176 176 163 198 154    Cardiac Enzymes: No results for input(s): CKTOTAL, CKMB, CKMBINDEX, TROPONINI in the last 168 hours.  BNP: Invalid input(s): POCBNP  CBG: No results for input(s): GLUCAP  in the last 168 hours.  Microbiology: Results for orders placed or performed during the hospital encounter of 04/13/16  Aerobic/Anaerobic Culture (surgical/deep wound)     Status: None (Preliminary result)   Collection Time: 04/13/16 11:35 AM  Result Value Ref Range Status   Specimen Description ABSCESS LEFT UPPER ARM  Final   Special Requests NONE  Final   Gram Stain   Final    FEW WBC PRESENT, PREDOMINANTLY MONONUCLEAR ABUNDANT GRAM POSITIVE COCCI IN CLUSTERS    Culture   Final    ABUNDANT STAPHYLOCOCCUS AUREUS RARE YEAST CULTURE REINCUBATED FOR BETTER GROWTH NO ANAEROBES ISOLATED; CULTURE IN PROGRESS FOR 5 DAYS    Report Status PENDING  Incomplete   Organism ID, Bacteria STAPHYLOCOCCUS AUREUS  Final      Susceptibility   Staphylococcus aureus - MIC*    CIPROFLOXACIN <=0.5 SENSITIVE Sensitive     ERYTHROMYCIN <=0.25 SENSITIVE Sensitive     GENTAMICIN <=0.5 SENSITIVE Sensitive     OXACILLIN <=0.25 SENSITIVE Sensitive     TETRACYCLINE <=1 SENSITIVE Sensitive     VANCOMYCIN <=0.5 SENSITIVE Sensitive  TRIMETH/SULFA <=10 SENSITIVE Sensitive     CLINDAMYCIN <=0.25 SENSITIVE Sensitive     RIFAMPIN <=0.5 SENSITIVE Sensitive     Inducible Clindamycin NEGATIVE Sensitive     * ABUNDANT STAPHYLOCOCCUS AUREUS    Coagulation Studies:  Recent Labs  04/16/16 0600 04/17/16 0230 04/18/16 0430  LABPROT 29.0* 31.4* 28.7*  INR 2.68 2.95 2.64    Urinalysis: No results for input(s): COLORURINE, LABSPEC, PHURINE, GLUCOSEU, HGBUR, BILIRUBINUR, KETONESUR, PROTEINUR, UROBILINOGEN, NITRITE, LEUKOCYTESUR in the last 72 hours.  Invalid input(s): APPERANCEUR    Imaging: No results found.   Medications:   . argatroban     . cinacalcet  120 mg Oral Q supper  . gabapentin  100 mg Oral QHS  . morphine   Intravenous Q4H  . pantoprazole  40 mg Oral Daily  . sevelamer carbonate  800 mg Oral TID WC  . sodium chloride flush  3 mL Intravenous Q12H  . sodium thiosulfate infusion for  calciphylaxis  25 g Intravenous Q T,Th,Sa-HD   sodium chloride, acetaminophen **OR** acetaminophen, alteplase, alum & mag hydroxide-simeth, anticoagulant sodium citrate, anticoagulant sodium citrate, diphenhydrAMINE **OR** diphenhydrAMINE, guaiFENesin-dextromethorphan, morphine injection, naloxone **AND** sodium chloride flush, ondansetron, ondansetron (ZOFRAN) IV, phenol, sodium chloride flush  Assessment/ Plan:  1 Dermal necrosis - s/p excision 04/14/16 with wound VAC placement. Probable calciphylaxis, path is pending. For returned to OR examination under anesthesia of left arm wound vac and placement of negative pressure dressing  , and plastic surg is evaluating for options. Wound cx +staph aureus, however the wound was never grossly infected and this is likely contaminant. Abx have been stopped. Started Na thio tiw with HD.   2 ESRD on HD using TDC. Started HD in [place] in 2004 after aortic dissection/ repair.  3 Paralysis - complications  Aortic dissection  4 Hx aortic dissection / repair '04 5 2HPTH on renvela/sensipar - holding VDRA/Ca products with suspected calciphalaxis, also low Ca bath, keep serum CA on low side (corr Ca 8-9 max). Na thio tiw.  6 HTN cont labetalol  7 Anemia of CKD - holding Fe with suspected calciphylaxis 8  Difficult lab draw - wrote DBIV order to use HD cath for now.     LOS: 5 Corey [name] W @TODAY @1 :04 PM

## 2016-04-18 NOTE — Anesthesia Postprocedure Evaluation (Addendum)
Anesthesia Post Note  Patient: Corey [name]  Procedure(s) Performed: Procedure(s) (LRB): APPLICATION OF WOUND VAC (Left)  Patient location during evaluation: PACU Anesthesia Type: General Level of consciousness: sedated Pain management: pain level controlled Vital Signs Assessment: post-procedure vital signs reviewed and stable Respiratory status: spontaneous breathing and respiratory function stable Cardiovascular status: stable Anesthetic complications: no       Last Vitals:  Vitals:   04/18/16 0920 04/18/16 0954  BP: (!) 97/58 (!) 97/59  Pulse:  70  Resp:  17  Temp:  36.3 C    Last Pain:  Vitals:   04/18/16 0954  TempSrc: Oral  PainSc:                  Ginnifer Creelman DANIEL

## 2016-04-18 NOTE — Anesthesia Preprocedure Evaluation (Signed)
Anesthesia Evaluation  Patient identified by MRN, date of birth, ID band Patient awake    Reviewed: Allergy & Precautions, H&P , NPO status , Patient's Chart, lab work & pertinent test results, reviewed documented beta blocker date and time   History of Anesthesia Complications Negative for: history of anesthetic complications  Airway Mallampati: II  TM Distance: >3 FB Neck ROM: Full    Dental no notable dental hx. (+) Teeth Intact, Dental Advisory Given   Pulmonary neg pulmonary ROS,    Pulmonary exam normal breath sounds clear to auscultation       Cardiovascular hypertension, Pt. on medications and Pt. on home beta blockers + dysrhythmias Atrial Fibrillation  Rhythm:Regular Rate:Normal     Neuro/Psych  Headaches, negative psych ROS   GI/Hepatic negative GI ROS, Neg liver ROS,   Endo/Other  negative endocrine ROS  Renal/GU ESRF and DialysisRenal disease  negative genitourinary   Musculoskeletal  (+) Arthritis , Osteoarthritis,    Abdominal   Peds  Hematology negative hematology ROS (+)   Anesthesia Other Findings   Reproductive/Obstetrics negative OB ROS                             Anesthesia Physical  Anesthesia Plan  ASA: III  Anesthesia Plan: General   Post-op Pain Management:    Induction: Intravenous  Airway Management Planned: Oral ETT and LMA  Additional Equipment:   Intra-op Plan:   Post-operative Plan: Extubation in OR  Informed Consent:   Plan Discussed with: CRNA  Anesthesia Plan Comments:         Anesthesia Quick Evaluation

## 2016-04-18 NOTE — Progress Notes (Signed)
ANTICOAGULATION CONSULT NOTE - Follow Up Consult  Pharmacy Consult for argatroban Indication: atrial fibrillation  Labs:  Recent Labs  04/16/16 0600 04/16/16 0851  04/16/16 1054  04/17/16 0230  04/17/16 1240 04/18/16 0430 04/18/16 1721 04/18/16 2215  HGB  --   --   < > 9.5*  --  10.1*  --   --  8.6*  --   --   HCT  --   --   --  31.2*  --  33.2*  --   --  27.6*  --   --   PLT  --   --   --  163  --  198  --   --  154  --   --   APTT 112*  --   --   --   < >  --   < > 121*  --  86* 78*  LABPROT 29.0*  --   --   --   --  31.4*  --   --  28.7*  --   --   INR 2.68  --   --   --   --  2.95  --   --  2.64  --   --   CREATININE  --  5.28*  --   --   --   --   --   --  6.76*  --   --   < > = values in this interval not displayed.   Assessment/Plan:  46yo male remains therapeutic on argatroban. Will continue gtt at current rate and monitor daily PTT.   Wynona Neat, PharmD, BCPS  04/18/2016,11:55 PM

## 2016-04-19 ENCOUNTER — Encounter (HOSPITAL_COMMUNITY): Payer: Self-pay | Admitting: Vascular Surgery

## 2016-04-19 LAB — RENAL FUNCTION PANEL
ALBUMIN: 2.6 g/dL — AB (ref 3.5–5.0)
Anion gap: 25 — ABNORMAL HIGH (ref 5–15)
BUN: 35 mg/dL — AB (ref 6–20)
CALCIUM: 7 mg/dL — AB (ref 8.9–10.3)
CO2: 23 mmol/L (ref 22–32)
CREATININE: 8.53 mg/dL — AB (ref 0.61–1.24)
Chloride: 93 mmol/L — ABNORMAL LOW (ref 101–111)
GFR, EST AFRICAN AMERICAN: 8 mL/min — AB (ref >60)
GFR, EST NON AFRICAN AMERICAN: 7 mL/min — AB (ref >60)
Glucose, Bld: 82 mg/dL (ref 65–99)
Phosphorus: 3.9 mg/dL (ref 2.5–4.6)
Potassium: 3.9 mmol/L (ref 3.5–5.1)
SODIUM: 141 mmol/L (ref 135–145)

## 2016-04-19 LAB — CBC
HCT: 26.3 % — ABNORMAL LOW (ref 39.0–52.0)
Hemoglobin: 8.1 g/dL — ABNORMAL LOW (ref 13.0–17.0)
MCH: 29.9 pg (ref 26.0–34.0)
MCHC: 30.8 g/dL (ref 30.0–36.0)
MCV: 97 fL (ref 78.0–100.0)
PLATELETS: 140 10*3/uL — AB (ref 150–400)
RBC: 2.71 MIL/uL — AB (ref 4.22–5.81)
RDW: 17 % — AB (ref 11.5–15.5)
WBC: 4.9 10*3/uL (ref 4.0–10.5)

## 2016-04-19 LAB — PROTIME-INR
INR: 2.27
Prothrombin Time: 25.4 seconds — ABNORMAL HIGH (ref 11.4–15.2)

## 2016-04-19 LAB — APTT: APTT: 88 s — AB (ref 24–36)

## 2016-04-19 MED ORDER — PENTAFLUOROPROP-TETRAFLUOROETH EX AERO: 1.0000 "application " | INHALATION_SPRAY | CUTANEOUS | Status: DC | PRN

## 2016-04-19 MED ORDER — LIDOCAINE HCL (PF) 1 % IJ SOLN: 5.0000 mL | INTRAMUSCULAR | Status: DC | PRN

## 2016-04-19 MED ORDER — ANTICOAGULANT SODIUM CITRATE 4% (200MG/5ML) IV SOLN
5.0000 mL | Freq: Once | Status: DC
  Filled 2016-04-19: qty 250

## 2016-04-19 MED ORDER — DARBEPOETIN ALFA 100 MCG/0.5ML IJ SOSY
100.0000 ug | PREFILLED_SYRINGE | INTRAMUSCULAR | Status: DC
  Administered 2016-04-19 – 2016-04-26 (×2): 100 ug via INTRAVENOUS
  Filled 2016-04-19: qty 0.5

## 2016-04-19 MED ORDER — LIDOCAINE-PRILOCAINE 2.5-2.5 % EX CREA: 1.0000 "application " | TOPICAL_CREAM | CUTANEOUS | Status: DC | PRN

## 2016-04-19 MED ORDER — OXYCODONE-ACETAMINOPHEN 5-325 MG PO TABS
1.0000 | ORAL_TABLET | ORAL | Status: DC | PRN
  Administered 2016-04-25 – 2016-04-27 (×7): 2 via ORAL
  Filled 2016-04-19 (×7): qty 2

## 2016-04-19 MED ORDER — SODIUM CHLORIDE 0.9 % IV SOLN: 100.0000 mL | INTRAVENOUS | Status: DC | PRN

## 2016-04-19 MED ORDER — HEPARIN SODIUM (PORCINE) 1000 UNIT/ML DIALYSIS: 1000.0000 [IU] | INTRAMUSCULAR | Status: DC | PRN

## 2016-04-19 MED ORDER — ALTEPLASE 2 MG IJ SOLR: 2.0000 mg | Freq: Once | INTRAMUSCULAR | Status: DC | PRN

## 2016-04-19 NOTE — Progress Notes (Signed)
Morphine PCA waste: 69ml in sharps container by pyxis.  Witnessed by Annamaria Boots, RN.

## 2016-04-19 NOTE — Progress Notes (Signed)
ANTICOAGULATION CONSULT NOTE - Follow Up Consult  Pharmacy Consult for Argatroban Indication: atrial fibrillation  Allergies  Allergen Reactions  . Heparin Other (See Comments)    UNSPECIFIED REACTION :  On Coumadin since 2004 ? ? HIT ? ?    Patient Measurements: Height: 6\' 2"  (188 cm) Weight: 194 lb 3.6 oz (88.1 kg) IBW/kg (Calculated) : 82.2  Vital Signs: Temp: 98 F (36.7 C) (01/16 1300) Temp Source: Oral (01/16 1300) BP: 106/60 (01/16 1300) Pulse Rate: 83 (01/16 1300)  Labs:  Recent Labs  04/17/16 0230  04/18/16 0430 04/18/16 1721 04/18/16 2215 04/19/16 0500 04/19/16 0700 04/19/16 0715  HGB 10.1*  --  8.6*  --   --   --   --  8.1*  HCT 33.2*  --  27.6*  --   --   --   --  26.3*  PLT 198  --  154  --   --   --   --  140*  APTT  --   < >  --  86* 78* 88*  --   --   LABPROT 31.4*  --  28.7*  --   --  25.4*  --   --   INR 2.95  --  2.64  --   --  2.27  --   --   CREATININE  --   --  6.76*  --   --   --  8.53*  --   < > = values in this interval not displayed.  Estimated Creatinine Clearance: 12.7 mL/min (by C-G formula based on SCr of 8.53 mg/dL (H)).  Medications: Argatroban @ 0.3mcg/kg/min  Assessment: 46 yo M on coumadin PTA for hx Afib (CHADsVASc = 1), admitted 1/10 for left upper arm I&D. Coumadin held and pharmacy asked to bridge with argatroban given possible history of HIT. Patient is going back to OR on 1/17. -aPTT is 88 and at goal   Goal of Therapy:  aPTT 50-90 seconds Monitor platelets by anticoagulation protocol: Yes   Plan:  -continue argatroban at 0.9mcg/kg/min -daily aPTT and CBC  Hildred Laser, Pharm D 04/19/2016 2:17 PM

## 2016-04-19 NOTE — Progress Notes (Signed)
   Daily Progress Note   Assessment/Planning: L hand steal syndrome, Calciphylaxis   OR tomorrow  I discussed VAC chg, plication vs ligation of L BVT Risk, benefits, and alternatives to access surgery were discussed.   The patient is aware the risks include but are not limited to: bleeding, infection, steal syndrome, nerve damage, ischemic monomelic neuropathy, thrombosis, failure to mature, need for additional procedures, death and stroke.   The patient agrees to proceed forward with the procedure.  Subjective  - 1 Day Post-Op  Continued L hand para/anesthesia   Objective Vitals:   04/19/16 1100 04/19/16 1126 04/19/16 1200 04/19/16 1300  BP: 111/65 114/64  106/60  Pulse: 78 79  83  Resp:  18 20 18   Temp:  98.5 F (36.9 C)  98 F (36.7 C)  TempSrc:  Oral  Oral  SpO2:  94% 97% 100%  Weight:      Height:         Intake/Output Summary (Last 24 hours) at 04/19/16 1858 Last data filed at 04/19/16 1126  Gross per 24 hour  Intake            375.6 ml  Output             1010 ml  Net           -634.4 ml    PULM  CTAB CV  RRR GI  soft, NTND VASC  Faintly palpable L radial pulse, hand grip 3-4/5, L arm VAC adherent  Laboratory CBC    Component Value Date/Time   WBC 4.9 04/19/2016 0715   HGB 8.1 (L) 04/19/2016 0715   HCT 26.3 (L) 04/19/2016 0715   PLT 140 (L) 04/19/2016 0715    BMET    Component Value Date/Time   NA 141 04/19/2016 0700   K 3.9 04/19/2016 0700   CL 93 (L) 04/19/2016 0700   CO2 23 04/19/2016 0700   GLUCOSE 82 04/19/2016 0700   BUN 35 (H) 04/19/2016 0700   CREATININE 8.53 (H) 04/19/2016 0700   CALCIUM 7.0 (L) 04/19/2016 0700   GFRNONAA 7 (L) 04/19/2016 0700   GFRAA 8 (L) 04/19/2016 0700     [name], MD, FACS [facility] Office: [phone] Pager: [phone]  04/19/2016, 6:58 PM

## 2016-04-19 NOTE — Progress Notes (Signed)
Witnessed [employer] Morphine PCA : 86ml in Radiation protection practitioner by [employer].   Fritz Pickerel, RN

## 2016-04-19 NOTE — Progress Notes (Signed)
Pt has not used PCA button since last night, discussed changing to PRN po medications with PRN IV meds for breakthrough pain.  Pt agrees that he would like to discontinue PCA at this time.  Bailey Mech, PA paged, new orders received.

## 2016-04-19 NOTE — Procedures (Signed)
I have seen and examined this patient and agree with the plan of care   The official pathology report was suspicious for calciphylaxis  Still need to address the steal issue in L arm   BP 110/70  Goal 1500  Catheter right IJ  No edema   K 3.9   Ca 7.0  Alb 2.6   Phos 3.9    Low calcium bath  Cinacalcet  Sevelamer   Hb 8.1   Check iron and needs Aranesp          Kalyna Paolella W 04/19/2016, 9:09 AM

## 2016-04-19 NOTE — Progress Notes (Signed)
   Daily Progress Note   Assessment/Planning: Calciphylaxis, s/p R arm debridement, multiple VAC changes   The official pathology report was suspicious for calciphylaxis.  After discussion with the pathologist, she agrees that the patient very likely has calciphylaxis as the dermal vessels all had calcifications in the medial.  Pt has already started HD today  Will discuss with Renal possible infusing sodium thiosulfate during HD runs.  I have no experience with such.  Still need to address the steal issue in L arm.  Will discuss going back to OR tomorrow with patient and either plicating or banding the fistula to minimal the trauma.  Subjective  - 1 Day Post-Op  No in room  Objective Vitals:   04/19/16 0706 04/19/16 0721 04/19/16 0730 04/19/16 0800  BP: 105/63 105/61 (!) 96/58 113/60  Pulse: 80 76 75 83  Resp: 10     Temp: 98 F (36.7 C)     TempSrc: Oral     SpO2:      Weight:      Height:         Intake/Output Summary (Last 24 hours) at 04/19/16 0823 Last data filed at 04/19/16 0544  Gross per 24 hour  Intake            958.6 ml  Output               12 ml  Net            946.6 ml    Not in room  Laboratory CBC    Component Value Date/Time   WBC 4.9 04/19/2016 0715   HGB 8.1 (L) 04/19/2016 0715   HCT 26.3 (L) 04/19/2016 0715   PLT 140 (L) 04/19/2016 0715    BMET    Component Value Date/Time   NA 141 04/19/2016 0700   K 3.9 04/19/2016 0700   CL 93 (L) 04/19/2016 0700   CO2 23 04/19/2016 0700   GLUCOSE 82 04/19/2016 0700   BUN 35 (H) 04/19/2016 0700   CREATININE 8.53 (H) 04/19/2016 0700   CALCIUM 7.0 (L) 04/19/2016 0700   GFRNONAA 7 (L) 04/19/2016 0700   GFRAA 8 (L) 04/19/2016 0700     [name], MD, FACS [facility] Office: [phone] Pager: [phone]  04/19/2016, 8:23 AM

## 2016-04-20 ENCOUNTER — Inpatient Hospital Stay (HOSPITAL_COMMUNITY): Payer: Medicare Other | Admitting: Anesthesiology

## 2016-04-20 ENCOUNTER — Encounter (HOSPITAL_COMMUNITY): Payer: Self-pay | Admitting: Anesthesiology

## 2016-04-20 ENCOUNTER — Encounter (HOSPITAL_COMMUNITY)
Admission: RE | Disposition: A | Discharge status: Home Health | Payer: Self-pay | Source: Ambulatory Visit | Attending: Vascular Surgery

## 2016-04-20 DIAGNOSIS — T82898A Other specified complication of vascular prosthetic devices, implants and grafts, initial encounter: Secondary | ICD-10-CM

## 2016-04-20 HISTORY — PX: APPLICATION OF WOUND VAC: SHX5189

## 2016-04-20 HISTORY — PX: REVISON OF ARTERIOVENOUS FISTULA: SHX6074

## 2016-04-20 LAB — BASIC METABOLIC PANEL
Anion gap: 17 — ABNORMAL HIGH (ref 5–15)
BUN: 13 mg/dL (ref 6–20)
CALCIUM: 7.6 mg/dL — AB (ref 8.9–10.3)
CO2: 22 mmol/L (ref 22–32)
CREATININE: 5.21 mg/dL — AB (ref 0.61–1.24)
Chloride: 97 mmol/L — ABNORMAL LOW (ref 101–111)
GFR calc non Af Amer: 12 mL/min — ABNORMAL LOW (ref >60)
GFR, EST AFRICAN AMERICAN: 14 mL/min — AB (ref >60)
Glucose, Bld: 87 mg/dL (ref 65–99)
Potassium: 3.7 mmol/L (ref 3.5–5.1)
SODIUM: 136 mmol/L (ref 135–145)

## 2016-04-20 LAB — CBC
HCT: 28.4 % — ABNORMAL LOW (ref 39.0–52.0)
Hemoglobin: 8.7 g/dL — ABNORMAL LOW (ref 13.0–17.0)
MCH: 29.8 pg (ref 26.0–34.0)
MCHC: 30.6 g/dL (ref 30.0–36.0)
MCV: 97.3 fL (ref 78.0–100.0)
PLATELETS: 147 10*3/uL — AB (ref 150–400)
RBC: 2.92 MIL/uL — ABNORMAL LOW (ref 4.22–5.81)
RDW: 16.8 % — AB (ref 11.5–15.5)
WBC: 5.4 10*3/uL (ref 4.0–10.5)

## 2016-04-20 LAB — GLUCOSE, CAPILLARY: GLUCOSE-CAPILLARY: 87 mg/dL (ref 65–99)

## 2016-04-20 LAB — PROTIME-INR
INR: 1.74
PROTHROMBIN TIME: 20.5 s — AB (ref 11.4–15.2)

## 2016-04-20 LAB — APTT: APTT: 74 s — AB (ref 24–36)

## 2016-04-20 LAB — CALCIUM, IONIZED: CALCIUM, IONIZED, SERUM: 4.4 mg/dL — AB (ref 4.5–5.6)

## 2016-04-20 SURGERY — APPLICATION, WOUND VAC
Anesthesia: General | Site: Arm Upper | Laterality: Left

## 2016-04-20 MED ORDER — MEPERIDINE HCL 25 MG/ML IJ SOLN: 6.2500 mg | INTRAMUSCULAR | Status: DC | PRN

## 2016-04-20 MED ORDER — ONDANSETRON HCL 4 MG/2ML IJ SOLN: 4.0000 mg | Freq: Once | INTRAMUSCULAR | Status: DC | PRN

## 2016-04-20 MED ORDER — FENTANYL CITRATE (PF) 100 MCG/2ML IJ SOLN
INTRAMUSCULAR | Status: DC | PRN
  Administered 2016-04-20: 25 ug via INTRAVENOUS
  Administered 2016-04-20: 50 ug via INTRAVENOUS
  Administered 2016-04-20: 25 ug via INTRAVENOUS

## 2016-04-20 MED ORDER — DEXTROSE 5 % IV SOLN
INTRAVENOUS | Status: DC | PRN
  Administered 2016-04-20: 25 ug/min via INTRAVENOUS

## 2016-04-20 MED ORDER — WARFARIN SODIUM 7.5 MG PO TABS
7.5000 mg | ORAL_TABLET | Freq: Once | ORAL | Status: AC
  Administered 2016-04-20: 7.5 mg via ORAL
  Filled 2016-04-20: qty 1

## 2016-04-20 MED ORDER — SODIUM CHLORIDE 0.9 % IV SOLN
INTRAVENOUS | Status: DC
  Administered 2016-04-20: 10:00:00 via INTRAVENOUS

## 2016-04-20 MED ORDER — LIDOCAINE HCL (CARDIAC) 20 MG/ML IV SOLN: INTRAVENOUS | Status: DC | PRN

## 2016-04-20 MED ORDER — WARFARIN - PHARMACIST DOSING INPATIENT
Freq: Every day | Status: DC
  Administered 2016-04-20 – 2016-04-24 (×2)

## 2016-04-20 MED ORDER — PROPOFOL 10 MG/ML IV BOLUS
INTRAVENOUS | Status: DC | PRN
  Administered 2016-04-20: 160 mg via INTRAVENOUS

## 2016-04-20 MED ORDER — HYDROMORPHONE HCL 1 MG/ML IJ SOLN
INTRAMUSCULAR | Status: AC
  Filled 2016-04-20: qty 0.5

## 2016-04-20 MED ORDER — FENTANYL CITRATE (PF) 100 MCG/2ML IJ SOLN
INTRAMUSCULAR | Status: AC
  Filled 2016-04-20: qty 2

## 2016-04-20 MED ORDER — ONDANSETRON HCL 4 MG/2ML IJ SOLN
INTRAMUSCULAR | Status: AC
  Filled 2016-04-20: qty 2

## 2016-04-20 MED ORDER — ONDANSETRON HCL 4 MG/2ML IJ SOLN
INTRAMUSCULAR | Status: DC | PRN
  Administered 2016-04-20: 4 mg via INTRAVENOUS

## 2016-04-20 MED ORDER — ARGATROBAN 50 MG/50ML IV SOLN
0.2000 ug/kg/min | INTRAVENOUS | Status: DC
  Administered 2016-04-20 – 2016-04-21 (×2): 0.25 ug/kg/min via INTRAVENOUS
  Administered 2016-04-23: 0.2 ug/kg/min via INTRAVENOUS
  Filled 2016-04-20 (×3): qty 50

## 2016-04-20 MED ORDER — HYDROMORPHONE HCL 1 MG/ML IJ SOLN
0.2500 mg | INTRAMUSCULAR | Status: DC | PRN
  Administered 2016-04-20 (×2): 0.5 mg via INTRAVENOUS

## 2016-04-20 MED ORDER — LIDOCAINE 2% (20 MG/ML) 5 ML SYRINGE
INTRAMUSCULAR | Status: DC | PRN
  Administered 2016-04-20: 100 mg via INTRAVENOUS

## 2016-04-20 MED ORDER — LIDOCAINE HCL (PF) 1 % IJ SOLN
INTRAMUSCULAR | Status: AC
Start: 2016-04-20 — End: 2016-04-20
  Filled 2016-04-20: qty 30

## 2016-04-20 MED ORDER — 0.9 % SODIUM CHLORIDE (POUR BTL) OPTIME
TOPICAL | Status: DC | PRN
  Administered 2016-04-20: 1000 mL

## 2016-04-20 MED ORDER — LIDOCAINE 2% (20 MG/ML) 5 ML SYRINGE
INTRAMUSCULAR | Status: AC
  Filled 2016-04-20: qty 5

## 2016-04-20 MED ORDER — MIDAZOLAM HCL 2 MG/2ML IJ SOLN
INTRAMUSCULAR | Status: AC
  Filled 2016-04-20: qty 2

## 2016-04-20 SURGICAL SUPPLY — 52 items
ARMBAND PINK RESTRICT EXTREMIT (MISCELLANEOUS) ×2 IMPLANT
CANISTER SUCTION 2500CC (MISCELLANEOUS) ×2 IMPLANT
CANISTER WOUND CARE 500ML ATS (WOUND CARE) ×2 IMPLANT
CLIP TI MEDIUM 6 (CLIP) ×2 IMPLANT
CLIP TI WIDE RED SMALL 6 (CLIP) ×2 IMPLANT
COVER PROBE W GEL 5X96 (DRAPES) IMPLANT
COVER SURGICAL LIGHT HANDLE (MISCELLANEOUS) ×2 IMPLANT
CUFF TOURNIQUET SINGLE 18IN (TOURNIQUET CUFF) ×2 IMPLANT
DECANTER SPIKE VIAL GLASS SM (MISCELLANEOUS) ×2 IMPLANT
DERMABOND ADVANCED (GAUZE/BANDAGES/DRESSINGS) ×1
DERMABOND ADVANCED .7 DNX12 (GAUZE/BANDAGES/DRESSINGS) ×1 IMPLANT
DRAIN PENROSE 1/2X12 LTX STRL (WOUND CARE) IMPLANT
DRAPE LAPAROSCOPIC ABDOMINAL (DRAPES) IMPLANT
DRAPE WARM FLUID 44X44 (DRAPE) ×2 IMPLANT
DRSG VAC ATS LRG SENSATRAC (GAUZE/BANDAGES/DRESSINGS) IMPLANT
DRSG VAC ATS MED SENSATRAC (GAUZE/BANDAGES/DRESSINGS) ×2 IMPLANT
ELECT REM PT RETURN 9FT ADLT (ELECTROSURGICAL) ×2
ELECTRODE REM PT RTRN 9FT ADLT (ELECTROSURGICAL) ×1 IMPLANT
GLOVE BIO SURGEON STRL SZ 6.5 (GLOVE) ×2 IMPLANT
GLOVE BIO SURGEON STRL SZ7 (GLOVE) ×2 IMPLANT
GLOVE BIOGEL PI IND STRL 6.5 (GLOVE) ×2 IMPLANT
GLOVE BIOGEL PI IND STRL 7.0 (GLOVE) ×1 IMPLANT
GLOVE BIOGEL PI IND STRL 7.5 (GLOVE) ×1 IMPLANT
GLOVE BIOGEL PI INDICATOR 6.5 (GLOVE) ×2
GLOVE BIOGEL PI INDICATOR 7.0 (GLOVE) ×1
GLOVE BIOGEL PI INDICATOR 7.5 (GLOVE) ×1
GOWN STRL REUS W/ TWL LRG LVL3 (GOWN DISPOSABLE) ×3 IMPLANT
GOWN STRL REUS W/ TWL XL LVL3 (GOWN DISPOSABLE) ×1 IMPLANT
GOWN STRL REUS W/TWL LRG LVL3 (GOWN DISPOSABLE) ×3
GOWN STRL REUS W/TWL XL LVL3 (GOWN DISPOSABLE) ×1
HEMOSTAT SPONGE AVITENE ULTRA (HEMOSTASIS) IMPLANT
KIT BASIN OR (CUSTOM PROCEDURE TRAY) ×2 IMPLANT
KIT ROOM TURNOVER OR (KITS) ×2 IMPLANT
NS IRRIG 1000ML POUR BTL (IV SOLUTION) ×2 IMPLANT
PACK CV ACCESS (CUSTOM PROCEDURE TRAY) ×2 IMPLANT
PACK GENERAL/GYN (CUSTOM PROCEDURE TRAY) ×2 IMPLANT
PAD ARMBOARD 7.5X6 YLW CONV (MISCELLANEOUS) ×4 IMPLANT
PAD NEG PRESSURE SENSATRAC (MISCELLANEOUS) ×2 IMPLANT
RETAINER VISCERA MED (MISCELLANEOUS) IMPLANT
SPONGE LAP 18X18 X RAY DECT (DISPOSABLE) ×2 IMPLANT
STAPLER VISISTAT 35W (STAPLE) IMPLANT
SUT MNCRL AB 4-0 PS2 18 (SUTURE) ×2 IMPLANT
SUT PDS AB 1 TP1 96 (SUTURE) IMPLANT
SUT PROLENE 5 0 C 1 24 (SUTURE) ×4 IMPLANT
SUT PROLENE 6 0 BV (SUTURE) IMPLANT
SUT PROLENE 7 0 BV 1 (SUTURE) ×2 IMPLANT
SUT SILK 0 TIES 10X30 (SUTURE) ×2 IMPLANT
SUT VIC AB 3-0 SH 27 (SUTURE) ×1
SUT VIC AB 3-0 SH 27X BRD (SUTURE) ×1 IMPLANT
TOWEL OR 17X24 6PK STRL BLUE (TOWEL DISPOSABLE) ×2 IMPLANT
UNDERPAD 30X30 (UNDERPADS AND DIAPERS) ×2 IMPLANT
WATER STERILE IRR 1000ML POUR (IV SOLUTION) ×2 IMPLANT

## 2016-04-20 NOTE — Care Management Note (Addendum)
Case Management Note Marvetta Gibbons RN, BSN Unit 2W-Case Manager [phone]  Patient Details  Name: Borden Thune MRN: [id number] Date of Birth: 1970/04/11  Subjective/Objective:   Pt admitted with Nonhealing surgical wound- s/p I&D with wound VAC placement                 Action/Plan: PTA pt lived at home with brother- orders have been placed for [facility] and home wound VAC- have received call from [place] with [facility]- pre-op referral has been made to them for [facility] services- have placed [facility] wound VAC form on shadow chart for [facility]- will fax to [place] with [facility] once signed. CM to follow for continued d/c arrangements. Spoke with pt at bedside who is agreeable to above arrangements- choice was offered for [facility] services- pt states he is ok using [facility].   Expected Discharge Date:                  Expected Discharge Plan:  [place]  In-House Referral:     Discharge planning Services  CM Consult  Post Acute Care Choice:  Durable Medical Equipment, Home Health Choice offered to:  Patient  DME Arranged:  Vac DME Agency:  [facility]  HH Arranged:  RN Drew Agency:  [facility]  Status of Service:  In process, will continue to follow  If discussed at Long Length of Stay Meetings, dates discussed:    Additional Comments:  Dawayne Patricia, RN 04/20/2016, 1:24 PM

## 2016-04-20 NOTE — H&P (View-Only) (Signed)
Postoperative Access Visit   History of Present Illness  Corey [name] is a 46 y.o. year old male who is s/p left second stage basilic vein transposition (brachiobasilic arteriovenous fistula) placement with revision of anastomosis on 03-09-16 by Dr. [name]. He returns today for staples removal.   He dialyzes T-TH-S via right upper chest catheter.  He reports mild to moderate steal symptoms in left hand/forearm.  He denies fever or chills.  Left upper arm incision edges are well proximated, staples in place.   The patient is able to complete their activities of daily living.    For VQI Use Only  PRE-ADM LIVING: Home  AMB STATUS: Wheelchair   Past Medical History:  Diagnosis Date  . A-fib ([place])   . Arthritis    hands and shoulders  . Blindness and low vision   . Dissection of aorta ([place]) 2004  . Dysrhythmia    A-fib  . ESRD (end stage renal disease) ([place])   . Headache   . History of cardioversion 2014  . Hypertension   . Neuropathy ([place])   . Paralysis ([place])    due to dissection of aorta in 2004, lower extremities  . Pneumonia      Current Outpatient Prescriptions on File Prior to Visit  Medication Sig Dispense Refill  . cinacalcet (SENSIPAR) 60 MG tablet Take 120 mg by mouth daily.     [name] gabapentin (NEURONTIN) 100 MG capsule Take 100 mg by mouth daily.    [name] labetalol (NORMODYNE) 300 MG tablet Take 300 mg by mouth 2 (two) times daily.    [name] oxyCODONE-acetaminophen (PERCOCET/ROXICET) 5-325 MG tablet Take 1 tablet by mouth every 6 (six) hours as needed. 15 tablet 0  . sevelamer carbonate (RENVELA) 800 MG tablet Take 800 mg by mouth 3 (three) times daily with meals.    . warfarin (COUMADIN) 7.5 MG tablet Take 7.5 mg by mouth daily.     No current facility-administered medications on file prior to visit.      Physical Examination Vitals:   03/23/16 1005  BP: 138/88  Pulse: 79  Resp: 16  Temp: 99 F (37.2 C)  TempSrc: Oral  SpO2: 95%  Weight: 199 lb (90.3 kg)    Height: 6\' 2"  (1.88 m)   Body mass index is 25.55 kg/m.  Left upper arm incision is healing with no erythema, no swelling, skin feels warm and normal, hand grip is 4/5, sensation in digits is intact, but slightly more numb than the right fingers, palpable thrill.  When Tegaderm dressing was removed, 3 small areas of epidermis slouphed off, stuck to Tegaderm; skin is thin and friable in some areas adjacent to incision, with seepage of serous dependent fluid collection.  Pt denies taking any prednisone like medications.  He does take coumadin, his past medical history includes atrial fib.  Dr. [name] spoke with and examined pt.   Medical Decision Making  Corey [name] is a 45 y.o. year old male who presents s/p left second stage basilic vein transposition (brachiobasilic arteriovenous fistula) placement with revision of anastomosis on 03-09-16 by Dr. [name].  All staples removed. Areas of sloughed skin cleansed with wound cleanser, xeroform dressing applied. Arrange for [facility] to perform daily cleansing and xeroform dressing changes to areas of sloughed skin adjacent to incision. HH was contacted and replied that pt would be visited tomorrow.   Follow up on 04-08-16 at 2 pm with PA for 4 weeks follow up, already scheduled.   Thank you for allowing  [place] to participate in this patient's care.  NICKEL, Sharmon Leyden, RN, MSN, FNP-C [facility] [place] Office: [phone]  03/23/2016, 10:24 AM  Clinic MD: [name]

## 2016-04-20 NOTE — Progress Notes (Signed)
Subjective:  Just back from Ligation L basilic vein AVF/ wound vac  Dr. [name] / [facility] HD yest  On schedule   Objective Vital signs in last 24 hours: Vitals:   04/19/16 1200 04/19/16 1300 04/19/16 2101 04/20/16 0507  BP:  106/60 102/65 121/62  Pulse:  83 83 82  Resp: 20 18 18 18   Temp:  98 F (36.7 C) 98.7 F (37.1 C) 99.3 F (37.4 C)  TempSrc:  Oral Oral Oral  SpO2: 97% 100% 93% 98%  Weight:    86.1 kg (189 lb 14.4 oz)  Height:       Weight change: -1.962 kg (-4 lb 5.2 oz)  Physical Exam: General: alert AAM chronically  ill appearing NAD OX3 Heart: RRR  No rub, mur , gal Lungs: CTA  non labored breathing  Abdomen:  bs pos.  Soft , NT , ND Extremities: bipedal 2+ edema/ L UA  With wound vac  On Necrotic wound  Dialysis Access: R IJ Permcath     OP Dialysis Orders: [facility] 3h 65min 87 kg= EDW  Bath= 2 k , 2.25ca R IJ cath  Hep none (allergic to heparin, using citrate) IV Fe load in progress   Hec 5 mcg    No Mircera   op labs= HGB  11.2    PTH  903 on 03/31/16  Ca 8.6  phos 5.1   Problem/Plan: 1. Left arm Calciphylaxis wound and steal syndrome -Sp Ligation AVF today / wound vac / Plastic surg  Consulting also/ On Coumadin and contracindicated now with Calciphylaxis in ESRD pt. (would use low dow Eliquis ) sodium thiosulfate on hd / sensipar  2. ESRD - HD on TTS  3. Anemia - HGB 8.1> 8.7  aranesp 100 mcg  q tues hd now / FE  Weekly on HD  4. Secondary hyperparathyroidism - Phos 3.9 04/19/16 using Renvela as binder  Last pth 903 03/31/16 on sensipar 120 qsupper   ,corec ca 8.7 today / Calciphylaxis as above  5. HTN/volume - bp 121/62  Not on bp meds and stable 98% O2 sayt on 3liters [place]  Has pedal edema attempt 2.5 - 3 liter uf tomor hd 6.  Hx aortic dissection / repair '04 7. Paralysis - complications of Aortic Dissection , is WC dependent      Ernest Haber, PA-C [facility] Beeper [phone] 04/20/2016,10:58 AM  LOS: 7 days   Labs: Basic Metabolic  Panel:  Recent Labs Lab 04/15/16 0442 04/16/16 0851 04/18/16 0430 04/19/16 0700 04/20/16 0523  NA 138 136 138 141 136  K 4.6 4.0 4.0 3.9 3.7  CL 96* 97* 95* 93* 97*  CO2 28 28 26 23 22   GLUCOSE 86 92 81 82 87  BUN 15 14 30* 35* 13  CREATININE 5.90* 5.28* 6.76* 8.53* 5.21*  CALCIUM 8.4* 8.6* 8.0* 7.0* 7.6*  PHOS 3.8 3.3  --  3.9  --    Liver Function Tests:  Recent Labs Lab 04/16/16 0851 04/19/16 0700  ALBUMIN 2.7* 2.6*   No results for input(s): LIPASE, AMYLASE in the last 168 hours. No results for input(s): AMMONIA in the last 168 hours. CBC:  Recent Labs Lab 04/16/16 1054 04/17/16 0230 04/18/16 0430 04/19/16 0715 04/20/16 0523  WBC 9.7 9.5 5.6 4.9 5.4  HGB 9.5* 10.1* 8.6* 8.1* 8.7*  HCT 31.2* 33.2* 27.6* 26.3* 28.4*  MCV 99.0 98.2 96.8 97.0 97.3  PLT 163 198 154 140* 147*   Cardiac Enzymes: No results for input(s): CKTOTAL,  CKMB, CKMBINDEX, TROPONINI in the last 168 hours. CBG: No results for input(s): GLUCAP in the last 168 hours.  Studies/Results: No results found. Medications: . sodium chloride 10 mL/hr at 04/20/16 0952   . [MAR Hold] cinacalcet  120 mg Oral Q supper  . [MAR Hold] darbepoetin (ARANESP) injection - DIALYSIS  100 mcg Intravenous Q Tue-HD  . [MAR Hold] gabapentin  100 mg Oral QHS  . [MAR Hold] pantoprazole  40 mg Oral Daily  . [MAR Hold] sevelamer carbonate  800 mg Oral TID WC  . [MAR Hold] sodium chloride flush  3 mL Intravenous Q12H  . [MAR Hold] sodium thiosulfate infusion for calciphylaxis  25 g Intravenous Q T,Th,Sa-HD

## 2016-04-20 NOTE — Transfer of Care (Signed)
Immediate Anesthesia Transfer of Care Note  Patient: Corey [name]  Procedure(s) Performed: Procedure(s): WOUND VAC CHANGE (Left) LIGATION OF BASILIC VEIN TRANSPOSITION (Left)  Patient Location: PACU  Anesthesia Type:General  Level of Consciousness: awake, alert  and oriented  Airway & Oxygen Therapy: Patient Spontanous Breathing and Patient connected to nasal cannula oxygen  Post-op Assessment: Report given to RN, Post -op Vital signs reviewed and stable and Patient moving all extremities X 4  Post vital signs: Reviewed and stable  Last Vitals:  Vitals:   04/19/16 2101 04/20/16 0507  BP: 102/65 121/62  Pulse: 83 82  Resp: 18 18  Temp: 37.1 C 37.4 C    Last Pain:  Vitals:   04/20/16 0738  TempSrc:   PainSc: 7       Patients Stated Pain Goal: 3 (56/70/14 1030)  Complications: No apparent anesthesia complications

## 2016-04-20 NOTE — Progress Notes (Addendum)
ANTICOAGULATION CONSULT NOTE - Follow Up Consult  Pharmacy Consult for Argatroban Indication: atrial fibrillation  Allergies  Allergen Reactions  . Heparin Other (See Comments)    UNSPECIFIED REACTION :  On Coumadin since 2004 ? ? HIT ? ?    Patient Measurements: Height: 6\' 2"  (188 cm) Weight: 189 lb 14.4 oz (86.1 kg) IBW/kg (Calculated) : 82.2  Vital Signs: Temp: 97.8 F (36.6 C) (01/17 1305) Temp Source: Oral (01/17 1305) BP: 114/69 (01/17 1305) Pulse Rate: 75 (01/17 1305)  Labs:  Recent Labs  04/18/16 0430 04/18/16 1721 04/18/16 2215 04/19/16 0500 04/19/16 0700 04/19/16 0715 04/20/16 0523  HGB 8.6*  --   --   --   --  8.1* 8.7*  HCT 27.6*  --   --   --   --  26.3* 28.4*  PLT 154  --   --   --   --  140* 147*  APTT  --  86* 78* 88*  --   --   --   LABPROT 28.7*  --   --  25.4*  --   --  20.5*  INR 2.64  --   --  2.27  --   --  1.74  CREATININE 6.76*  --   --   --  8.53*  --  5.21*    Estimated Creatinine Clearance: 20.8 mL/min (by C-G formula based on SCr of 5.21 mg/dL (H)).    Assessment: 46 yo M on coumadin PTA for hx Afib (CHADsVASc = 2; HTN and vascular disease); on coumadin since 2004), admitted 1/10 for left upper arm I&D. Coumadin has been on hold and pharmacy has been dosing argatroban for bridging with possible history of HIT.  Pharmacy consulted to resume argatroban post op on 1/17.   -Argatroban Spoke with Virgina Jock, PA: With CHADSVASC= 2 and recent OR procedures, bridging with argatroban may be of more risk than benefit (Of note the patient was not on a bridge prior to admission). The transition to coumadin monotherapy can also be very complicated as argatroban influences the INR.   -Coumadin and calciphylaxis Patient also noted with calciphylaxis which can be worsened by coumadin.  Apixiban has approved ESRD dosing but there are no clinical or long-term safety studies. Guidelines support warfarin in ESRD.   Spoke with Dr. Justin Mend:  recommendations on coumadin with calciphylaxis are not straight forward.  Will need to consider risk versus benefit of coumadin. Alternative strategies could be considered (apixiban or lovenox) but these anticoagulation strategies may not be suitable (due to available data and cost to patient).   Goal of Therapy:  Goal INR= 2-3 Monitor platelets by anticoagulation protocol: Yes   Plan:  -discontinue argatroban -Coumadin 7.5mg  po today -Daily PT/INR -May need to weigh risk/benefit of coumadin. Spoke with Virgina Jock PA,  and she will speak with cardiology  Hildred Laser, Pharm D 04/20/2016 1:33 PM   Addendum -There are concerns with microemboli with calciphylaxis. Will need to discuss with Dr. [name]  -will continue argatroban per previous orders and discuss treatment plan with the team on 1/18  Plan -argatroban at 0.67mcg/kg/hr -aPTT in 4 hrs and daily  Hildred Laser, Pharm D 04/20/2016 3:34 PM

## 2016-04-20 NOTE — Progress Notes (Signed)
ANTICOAGULATION CONSULT NOTE - Follow Up Consult  Pharmacy Consult for argatroban Indication: atrial fibrillation  Labs:  Recent Labs  04/18/16 0430  04/18/16 2215 04/19/16 0500 04/19/16 0700 04/19/16 0715 04/20/16 0523 04/20/16 2226  HGB 8.6*  --   --   --   --  8.1* 8.7*  --   HCT 27.6*  --   --   --   --  26.3* 28.4*  --   PLT 154  --   --   --   --  140* 147*  --   APTT  --   < > 78* 88*  --   --   --  74*  LABPROT 28.7*  --   --  25.4*  --   --  20.5*  --   INR 2.64  --   --  2.27  --   --  1.74  --   CREATININE 6.76*  --   --   --  8.53*  --  5.21*  --   < > = values in this interval not displayed.   Assessment/Plan:  46yo male remains therapeutic on argatroban after resuming at prior therapeutic rate. Will continue gtt at current rate and monitor daily PTT.   Wynona Neat, PharmD, BCPS  04/20/2016,11:22 PM

## 2016-04-20 NOTE — Interval H&P Note (Signed)
History and Physical Interval Note:  04/20/2016 10:03 AM  Corey [name]  has presented today for surgery, with the diagnosis of End Stage Renal Diseae N18.6; Left arm steal syndrome T82.510  The various methods of treatment have been discussed with the patient and family. After consideration of risks, benefits and other options for treatment, the patient has consented to  Procedure(s): WOUND VAC CHANGE (Left) PLICATION OF BASILIC VEIN TRANSPOSITION (Left) as a surgical intervention .  The patient's history has been reviewed, patient examined, no change in status, stable for surgery.  I have reviewed the patient's chart and labs.  Questions were answered to the patient's satisfaction.     [name]

## 2016-04-20 NOTE — Anesthesia Procedure Notes (Signed)
Procedure Name: LMA Insertion Date/Time: 04/20/2016 10:44 AM Performed by: Rush Farmer E Pre-anesthesia Checklist: Patient identified, Emergency Drugs available, Suction available and Patient being monitored Patient Re-evaluated:Patient Re-evaluated prior to inductionOxygen Delivery Method: Circle system utilized Preoxygenation: Pre-oxygenation with 100% oxygen Intubation Type: IV induction LMA: LMA inserted LMA Size: 4.0 Number of attempts: 1 Placement Confirmation: positive ETCO2 and breath sounds checked- equal and bilateral Tube secured with: Tape Dental Injury: Teeth and Oropharynx as per pre-operative assessment

## 2016-04-20 NOTE — Anesthesia Preprocedure Evaluation (Signed)
Anesthesia Evaluation  Patient identified by MRN, date of birth, ID band Patient awake    Reviewed: Allergy & Precautions, NPO status , Patient's Chart, lab work & pertinent test results  Airway Mallampati: I  TM Distance: >3 FB Neck ROM: Full    Dental   Pulmonary    Pulmonary exam normal        Cardiovascular hypertension, Pt. on medications Normal cardiovascular exam     Neuro/Psych    GI/Hepatic   Endo/Other    Renal/GU Renal InsufficiencyRenal disease     Musculoskeletal   Abdominal   Peds  Hematology   Anesthesia Other Findings   Reproductive/Obstetrics                             Anesthesia Physical Anesthesia Plan  ASA: III  Anesthesia Plan: General   Post-op Pain Management:    Induction: Intravenous  Airway Management Planned: LMA  Additional Equipment:   Intra-op Plan:   Post-operative Plan: Extubation in OR  Informed Consent: I have reviewed the patients History and Physical, chart, labs and discussed the procedure including the risks, benefits and alternatives for the proposed anesthesia with the patient or authorized representative who has indicated his/her understanding and acceptance.     Plan Discussed with: CRNA and Surgeon  Anesthesia Plan Comments:         Anesthesia Quick Evaluation

## 2016-04-20 NOTE — Op Note (Signed)
    OPERATIVE NOTE   PROCEDURE: 1. Ligation of left basilic vein transposition  2. Placement of negative pressure dressing  PRE-OPERATIVE DIAGNOSIS: steal syndrome, calciphylaxis, left arm wound  POST-OPERATIVE DIAGNOSIS: same as above   SURGEON: [name], MD  ASSISTANT(S): Silva Bandy, PAC   ANESTHESIA: general  ESTIMATED BLOOD LOSS: 100 cc  FINDING(S): 1.  1-2 mm tissue overlying fistula: not compatible with long-term fistula use due to high risk for bleeding from erosion 2.  Palpable radial pulse 3.  Patches of ischemic fat with viable fat underneath  SPECIMEN(S):  none  INDICATIONS:   Corey [name] is a 46 y.o. male who presents with left hand steal syndrome and left upper arm wound consistent with calciphylaxis.  The patient returns to [facility] for wound VAC change and attempt to plicate the left basilic vein transposition.Risk, benefits, and alternatives to access surgery were discussed.  The patient is aware the risks include but are not limited to: bleeding, infection, steal syndrome, nerve damage, ischemic monomelic neuropathy, thrombosis, failure to mature, need for additional procedures, death and stroke.  The patient agrees to proceed forward with the procedure. .  DESCRIPTION: After obtaining full informed written consent, the patient was brought back to the operating room and placed supine upon the operating table.  The patient received IV antibiotics prior to induction.  After obtaining adequate anesthesia, the patient was prepped and draped in the standard fashion for: left arm access procedure.  The prior VAC dressing had been removed.  I made an incision over the distal fistula.  Immediately, it became evident that there was minimal tissue overlying the fistula, 1-2 mm over a nearly 5 cm length of the distal fistula.  I felt this would have put the patient at very high risk of life-threatening bleeding when the fistula ruptured with an erosion.  I subsequently did  not feel it was safe to plicate this fistula.  I dissected out the distal fistula and tied two 0 silk ties around the fistula and then transected the fistula, leaving a vein patch on the brachial artery.  Unfortunately, the distal tie was inadequate so I had to oversew the distal vein patch on the artery with a double layer of 5-0 Prolene.  I reapproximated the flimsy subcutaneous tissue with a double layer of 3-0 Vicryl.   At this point, I fashion the VAC sponge for this wound.  I afixed the sponge with adhesive strips.  A hole was cut to allow attachment of the lilypad.  I attached the tubing to the VAC pump and the pump was engaged at 125 continuous.  The sponge became adherent.   COMPLICATIONS: none  CONDITION: stable   [name], MD, [facility] [place] Office: [phone] Pager: [phone]  04/20/2016, 11:35 AM

## 2016-04-20 NOTE — Anesthesia Postprocedure Evaluation (Signed)
Anesthesia Post Note  Patient: Corey [name]  Procedure(s) Performed: Procedure(s) (LRB): WOUND VAC CHANGE (Left) LIGATION OF BASILIC VEIN TRANSPOSITION (Left)  Patient location during evaluation: PACU Anesthesia Type: General Level of consciousness: awake and alert Pain management: pain level controlled Vital Signs Assessment: post-procedure vital signs reviewed and stable Respiratory status: spontaneous breathing, nonlabored ventilation, respiratory function stable and patient connected to nasal cannula oxygen Cardiovascular status: blood pressure returned to baseline and stable Postop Assessment: no signs of nausea or vomiting Anesthetic complications: no       Last Vitals:  Vitals:   04/20/16 1242 04/20/16 1305  BP:  114/69  Pulse:  75  Resp:  18  Temp: 36.5 C 36.6 C    Last Pain:  Vitals:   04/20/16 1305  TempSrc: Oral  PainSc:                  Arali Somera DAVID

## 2016-04-21 ENCOUNTER — Encounter (HOSPITAL_COMMUNITY): Payer: Self-pay | Admitting: Vascular Surgery

## 2016-04-21 LAB — PROTIME-INR
INR: 2.13
Prothrombin Time: 24.2 seconds — ABNORMAL HIGH (ref 11.4–15.2)

## 2016-04-21 LAB — APTT: aPTT: 87 seconds — ABNORMAL HIGH (ref 24–36)

## 2016-04-21 MED ORDER — WARFARIN SODIUM 7.5 MG PO TABS
7.5000 mg | ORAL_TABLET | Freq: Once | ORAL | Status: AC
  Administered 2016-04-21: 7.5 mg via ORAL
  Filled 2016-04-21: qty 1

## 2016-04-21 NOTE — Progress Notes (Addendum)
[facility] [place]  Subjective  - Resting well   Objective 125/76 74 98 F (36.7 C) (Oral) 16 99%  Intake/Output Summary (Last 24 hours) at 04/21/16 0735 Last data filed at 04/20/16 1630  Gross per 24 hour  Intake              570 ml  Output               50 ml  Net              520 ml    Negative pressure dressing intact and working well  Assessment/Planning: PROCEDURE: 1. Ligation of left basilic vein transposition  2. Placement of negative pressure dressing    Theda Sers, EMMA MAUREEN 04/21/2016 7:35 AM --  Laboratory Lab Results:  Recent Labs  04/19/16 0715 04/20/16 0523  WBC 4.9 5.4  HGB 8.1* 8.7*  HCT 26.3* 28.4*  PLT 140* 147*   BMET  Recent Labs  04/19/16 0700 04/20/16 0523  NA 141 136  K 3.9 3.7  CL 93* 97*  CO2 23 22  GLUCOSE 82 87  BUN 35* 13  CREATININE 8.53* 5.21*  CALCIUM 7.0* 7.6*    COAG Lab Results  Component Value Date   INR 2.13 04/21/2016   INR 1.74 04/20/2016   INR 2.27 04/19/2016   No results found for: PTT  Addendum  I have independently interviewed and examined the patient, and I agree with the physician assistant's findings.  Argatroban to Coumadin load.  Setup outpatient Home Health for [facility] dressings: M-W-F.  - Ok to D/C once coumadin load completed (INR > 4 due to interaction of Argatroban and Coumadin) - Follow up with Dr. Leland Johns in 2 weeks - Follow up with Dr. [name] in 2 weeks  [name], MD, [facility] [place] Office: [phone] Pager: [phone]  04/21/2016, 7:42 AM

## 2016-04-21 NOTE — Progress Notes (Addendum)
ANTICOAGULATION CONSULT NOTE - Follow Up Consult  Pharmacy Consult for Argatroban Indication: atrial fibrillation  Allergies  Allergen Reactions  . Heparin Other (See Comments)    UNSPECIFIED REACTION :  On Coumadin since 2004 ? ? HIT ? ?    Patient Measurements: Height: 6\' 2"  (188 cm) Weight: 190 lb 4.8 oz (86.3 kg) IBW/kg (Calculated) : 82.2  Vital Signs: Temp: 98 F (36.7 C) (01/18 0424) Temp Source: Oral (01/18 0424) BP: 125/76 (01/18 0424) Pulse Rate: 74 (01/18 0424)  Labs:  Recent Labs  04/19/16 0500 04/19/16 0700 04/19/16 0715 04/20/16 0523 04/20/16 2226 04/21/16 0402  HGB  --   --  8.1* 8.7*  --   --   HCT  --   --  26.3* 28.4*  --   --   PLT  --   --  140* 147*  --   --   APTT 88*  --   --   --  74* 87*  LABPROT 25.4*  --   --  20.5*  --  24.2*  INR 2.27  --   --  1.74  --  2.13  CREATININE  --  8.53*  --  5.21*  --   --     Estimated Creatinine Clearance: 20.8 mL/min (by C-G formula based on SCr of 5.21 mg/dL (H)).    Assessment: 46 yo M on coumadin PTA for hx Afib (CHADsVASc = 2; HTN and vascular disease); on coumadin since 2004), admitted 1/10 for left upper arm I&D. Coumadin has been on hold and pharmacy has been dosing argatroban for bridging with possible history of HIT.  Pharmacy consulted to resume argatroban with coumadin post op on 1/17.  Per vascular may discontinue argatroban once INR is > 4.   -INR= 2.13, aPTT= 87  -With current calciphylaxis may need to consider an alternative to coumadin.  Given morbidity of worsening calciphylaxis on coumadin apixiban could offer a reasonable alterative.  Goal of Therapy:  Goal INR= 4-6 while on argatroban Monitor platelets by anticoagulation protocol: Yes   Plan:  -Continue argatroban -Coumadin 7.5mg  po today -Consider weighing benefit/risk of apixiban -Daily PT/INR, aPTT, CBC  Hildred Laser, Pharm D 04/21/2016 8:52 AM   Addendum -Discussed coumadin vs apixiban with Laurence Slate PA and she will  discuss with Dr. [name] on 1/29   Hildred Laser, Pharm D 04/21/2016 4:37 PM

## 2016-04-21 NOTE — Consult Note (Signed)
[place] team will plan to change Vac on Friday 1/19 as requested. Thank-you,  Julien Girt MSN, [place], [place], [place], [place]

## 2016-04-21 NOTE — Procedures (Signed)
I have seen and examined this patient and agree with the plan of care   BP 116/77     Ca 7.6     cinacalcet and low calcium bath   - sodium thiosulfate  8 weeks   Hb 8.7   Decision to use Eliquis instead of coumadin  Will need to have a discussion with cardiologist     Edrick Oh W 04/21/2016, 10:06 AM

## 2016-04-22 LAB — PROTIME-INR
INR: 2.76
PROTHROMBIN TIME: 29.8 s — AB (ref 11.4–15.2)

## 2016-04-22 LAB — APTT
aPTT: 100 seconds — ABNORMAL HIGH (ref 24–36)
aPTT: 87 seconds — ABNORMAL HIGH (ref 24–36)

## 2016-04-22 MED ORDER — WARFARIN SODIUM 10 MG PO TABS
10.0000 mg | ORAL_TABLET | Freq: Once | ORAL | Status: AC
  Administered 2016-04-22: 10 mg via ORAL
  Filled 2016-04-22: qty 1

## 2016-04-22 NOTE — Progress Notes (Signed)
[facility] Progress Note   Subjective: "My arm is hurting. They just changed my drsg." Also C/O pain in BLE-"neuropathy."   Objective Vitals:   04/21/16 1300 04/21/16 2037 04/22/16 0121 04/22/16 0346  BP: 135/77 135/82 131/75 129/77  Pulse: 73 71 75 72  Resp: 18 18 18 18   Temp: 97.8 F (36.6 C) 98.6 F (37 C) 98.6 F (37 C) 98.1 F (36.7 C)  TempSrc: Oral Oral Oral Oral  SpO2: 100% 100% 97% 100%  Weight:    82.3 kg (181 lb 8 oz)  Height:       Physical Exam General: pleasant, chronically ill appearing pt, NAD.  Heart: Z8,H8, 1/6 systolic M Lungs: CTAB. No WOB Abdomen: Soft, non-tender.  Extremities: woody appearance BLE trace edema. Has wound vac LUA at AVF ligation site.  Dialysis Access: [facility] drsg CDI.   Additional Objective Labs: Basic Metabolic Panel:  Recent Labs Lab 04/16/16 0851 04/18/16 0430 04/19/16 0700 04/20/16 0523  NA 136 138 141 136  K 4.0 4.0 3.9 3.7  CL 97* 95* 93* 97*  CO2 28 26 23 22   GLUCOSE 92 81 82 87  BUN 14 30* 35* 13  CREATININE 5.28* 6.76* 8.53* 5.21*  CALCIUM 8.6* 8.0* 7.0* 7.6*  PHOS 3.3  --  3.9  --    Liver Function Tests:  Recent Labs Lab 04/16/16 0851 04/19/16 0700  ALBUMIN 2.7* 2.6*   CBC:  Recent Labs Lab 04/16/16 1054 04/17/16 0230 04/18/16 0430 04/19/16 0715 04/20/16 0523  WBC 9.7 9.5 5.6 4.9 5.4  HGB 9.5* 10.1* 8.6* 8.1* 8.7*  HCT 31.2* 33.2* 27.6* 26.3* 28.4*  MCV 99.0 98.2 96.8 97.0 97.3  PLT 163 198 154 140* 147*   Blood Culture    Component Value Date/Time   SDES ABSCESS LEFT UPPER ARM 04/13/2016 1135   SPECREQUEST NONE 04/13/2016 1135   CULT  04/13/2016 1135    ABUNDANT STAPHYLOCOCCUS AUREUS RARE CANDIDA PARAPSILOSIS NO ANAEROBES ISOLATED    REPTSTATUS 04/18/2016 FINAL 04/13/2016 1135    CBG:  Recent Labs Lab 04/20/16 1151  GLUCAP 87   Iron Studies: No results for input(s): IRON, TIBC, TRANSFERRIN, FERRITIN in the last 72  hours. @lablastinr3 @ Studies/Results: No results found. Medications: . sodium chloride 10 mL/hr at 04/20/16 0952  . argatroban 0.2 mcg/kg/min (04/22/16 0926)   . cinacalcet  120 mg Oral Q supper  . darbepoetin (ARANESP) injection - DIALYSIS  100 mcg Intravenous Q Tue-HD  . gabapentin  100 mg Oral QHS  . pantoprazole  40 mg Oral Daily  . sevelamer carbonate  800 mg Oral TID WC  . sodium chloride flush  3 mL Intravenous Q12H  . sodium thiosulfate infusion for calciphylaxis  25 g Intravenous Q T,Th,Sa-HD  . warfarin  10 mg Oral ONCE-1800  . Warfarin - Pharmacist Dosing Inpatient   Does not apply q1800     OP Dialysis Orders: [facility] 3h 52min 87 kg= EDW  Bath= 2 k , 2.25ca R IJ cath  Hep none (allergic to heparin, using citrate) IV Fe load in progress   Hec 5 mcg    No Mircera   op labs= HGB  11.2    PTH  903 on 03/31/16  Ca 8.6  phos 5.1   Problem/Plan: 1. Left arm Calciphylaxis wound and steal syndrome -Sp Ligation AVF 04/20/16 / wound vac / Plastic surg  Consulting also/ On Coumadin which is contraindicated with Calciphylaxis in ESRD pt, bridging with argatroban. Dr. [name] wants coumadin D/T being  able to reverse coumadin in the event of bleeding complications. Discussed with pharmacy as apixaban would be preferred agent. Sodium thiosulfate on hd /sensipar.  2. ESRD - HD on TTS. Next HD tomorrow on schedule. K+3.7. Renal panel prior to HD. Use 3.0 K 2.0 Ca bath.  3. Anemia - HGB 8.7  04/20/16.  aranesp 100 mcg  q tues hd now / FE  Weekly on HD  4. Secondary hyperparathyroidism - Phos 3.9 04/19/16 using Renvela as binder  Last pth 903 03/31/16 on sensipar 120 q supper C Ca 8.7 today. Calciphylaxis as above-no VDRA.  5. HTN/volume -Last HD 04/21/16 Pre wt 86.3 kg Net UF 2500 Post wt 84.3 kg Is under OP EDW. Lower EDW on DC. No evidence of volume overload.UFG 1-1.5 liters tomorrow.  6.  Hx aortic dissection / repair '04 7. Paralysis - complications of Aortic Dissection , is WC  dependent however he says he can stand.   Zaiyah Sottile H. Monterrio Gerst NP-C 04/22/2016, 10:26 AM  [employer] [phone]

## 2016-04-22 NOTE — Progress Notes (Addendum)
[facility] [place]  Subjective  -  Doing OK over all.   Objective 129/77 72 98.1 F (36.7 C) (Oral) 18 100%  Intake/Output Summary (Last 24 hours) at 04/22/16 0746 Last data filed at 04/22/16 0600  Gross per 24 hour  Intake            145.6 ml  Output             2025 ml  Net          -1879.4 ml    Left UE wound vac in place and working. Palpable 3+ radial pulse left UE   Assessment/Planning: Assessment/Planning: PROCEDURE: 1. Ligation of left basilic vein transposition 2. Placement of negative pressure dressing  Dr. [name] discussed Argatroban to Coumadin load.  Setup outpatient Home Health for [facility] dressings: M-W-F.  INR needs to be 4 due to false reports while being on Argatroban prior to discharge.  I had a call from the pharmacist Mitzi Hansen [phone] and he suggested  Apixaban instead of Coumadin I will discuss this with Dr. [name].      Laurence Slate [facility] 04/22/2016 7:46 AM --  Laboratory Lab Results:  Recent Labs  04/20/16 0523  WBC 5.4  HGB 8.7*  HCT 28.4*  PLT 147*   BMET  Recent Labs  04/20/16 0523  NA 136  K 3.7  CL 97*  CO2 22  GLUCOSE 87  BUN 13  CREATININE 5.21*  CALCIUM 7.6*    COAG Lab Results  Component Value Date   INR 2.76 04/22/2016   INR 2.13 04/21/2016   INR 1.74 04/20/2016   No results found for: PTT  Addendum  I have independently interviewed and examined the patient, and I agree with the physician assistant's findings.  Plan unchanged: load coumadin on Argatroban bridge as no reversal agent and discharge with wound VAC.  This pt is at high risk for a bleeding complication given his extensive upper arm wound.    - suspect will be [place] before all home health issue resolved and INR >4   [name], MD, FACS [facility] [place] Office: [phone] Pager: [phone]  04/22/2016, 8:05 AM

## 2016-04-22 NOTE — Progress Notes (Addendum)
ANTICOAGULATION CONSULT NOTE - Follow Up Consult  Pharmacy Consult for Argatroban and Warfarin Indication: atrial fibrillation  Allergies  Allergen Reactions  . Heparin Other (See Comments)    UNSPECIFIED REACTION :  On Coumadin since 2004 ? ? HIT ? ?   Patient Measurements: Height: 6\' 2"  (188 cm) Weight: 181 lb 8 oz (82.3 kg) IBW/kg (Calculated) : 82.2  Vital Signs: Temp: 98.1 F (36.7 C) (01/19 0346) Temp Source: Oral (01/19 0346) BP: 129/77 (01/19 0346) Pulse Rate: 72 (01/19 0346)  Labs:  Recent Labs  04/20/16 0523 04/20/16 2226 04/21/16 0402 04/22/16 0523  HGB 8.7*  --   --   --   HCT 28.4*  --   --   --   PLT 147*  --   --   --   APTT  --  74* 87* 100*  LABPROT 20.5*  --  24.2* 29.8*  INR 1.74  --  2.13 2.76  CREATININE 5.21*  --   --   --    Estimated Creatinine Clearance: 20.8 mL/min (by C-G formula based on SCr of 5.21 mg/dL (H)).  Assessment: 46 yo M on coumadin PTA for hx Afib (CHADsVASc = 2; HTN and vascular disease); on coumadin since 2004), admitted 1/10 for left upper arm I&D. Coumadin has been on hold and pharmacy has been dosing argatroban for bridging with possible history of HIT.  Pharmacy consulted to resume argatroban with coumadin post op on 1/17.  Per vascular may discontinue argatroban once INR is > 4.  Patient has received 2 doses of warfarin post op. Pharmacist discussed apixaban as possible oral anticoagulation option with PA on 1/18, given increased morbidity associated with warfarin and calciphylaxis, however attending declined the recommendation. No bleeding or infusion related issues.  INR subtherapeutic: 2.76 APTT supratherapeutic: 100  Goal of Therapy:  Goal INR= 4-6 while on argatroban Monitor platelets by anticoagulation protocol: Yes   Plan:  -Decrease argatroban 20% --> 0.20 mcg/kg/min -Repeat aPTT in 2 hours -Coumadin 10mg  po today -Consider weighing benefit/risk of apixiban -Daily PT/INR, aPTT, CBC  [name],  PharmD Clinical Pharmacist Pager: [phone] 04/22/2016 8:56 AM   Addendum: Damaris Schooner with provider from [facility] about the anticoagulation plan at ~1230 and concurred with recommendation to use Eliquis. At that time I suggested that she try to speak with Dr. [name] directly as pharmacy has reached out to both the attending and his extender who do not wish to accept the recommendation based on the availability of a reversal agent.

## 2016-04-22 NOTE — Consult Note (Addendum)
[place] Nurse wound consult note Reason for Consult: Consult requested by vascular team to change Vac dressing Wound type: Full thickness post-op wound to left upper inner arm Measurement: 10X13X.3 cm 1X1 cm yellow full thickness wound located beneath the Vac site which has a foam dressing in place, no odor or drainage. Wound bed: 85% red, 15% yellow Drainage (amount, consistency, odor) Mod amt pink drainage, no odor Periwound: Intact skin surrounding Dressing procedure/placement/frequency: Attempted to remove dressing; pt was medicated for pain prior to procedure but still was in extreme pain and required a second dose of IV Morphine.  He states "I will NOT be able to tolerate this dressing change at home without those meds, it is horrible.  Can't they just knock me out?"  Applied Mepitel contact layer to decrease pain and adherence of dressing with next Vac change.  Applied one piece black sponge to 160mm cont suction.  Pt was shaking and crying after second dose of pain medication was given and it was difficult for him to tolerate. Plan for dressing change on Monday if patient is still in the hospital at that time. Julien Girt MSN, RN, [place], [place], [place]

## 2016-04-22 NOTE — Care Management Important Message (Signed)
Important Message  Patient Details  Name: Corey [name] MRN: [id number] Date of Birth: January 15, 1971   Medicare Important Message Given:  Yes    Nathen May 04/22/2016, 4:28 PM

## 2016-04-22 NOTE — Progress Notes (Signed)
ANTICOAGULATION CONSULT NOTE - Follow Up Consult  Pharmacy Consult for Argatroban and Warfarin Indication: atrial fibrillation  Allergies  Allergen Reactions  . Heparin Other (See Comments)    UNSPECIFIED REACTION :  On Coumadin since 2004 ? ? HIT ? ?   Patient Measurements: Height: 6\' 2"  (188 cm) Weight: 181 lb 8 oz (82.3 kg) IBW/kg (Calculated) : 82.2  Vital Signs: Temp: 98.1 F (36.7 C) (01/19 1430) Temp Source: Oral (01/19 1430) BP: 130/74 (01/19 1430) Pulse Rate: 73 (01/19 1430)  Labs:  Recent Labs  04/20/16 0523  04/21/16 0402 04/22/16 0523 04/22/16 1200  HGB 8.7*  --   --   --   --   HCT 28.4*  --   --   --   --   PLT 147*  --   --   --   --   APTT  --   < > 87* 100* 87*  LABPROT 20.5*  --  24.2* 29.8*  --   INR 1.74  --  2.13 2.76  --   CREATININE 5.21*  --   --   --   --   < > = values in this interval not displayed. Estimated Creatinine Clearance: 20.8 mL/min (by C-G formula based on SCr of 5.21 mg/dL (H)).  Assessment: 2nd shift follow-up of aPTT:   APTT = 87 sec on Argatroban infusion 0.20 mcg/kg/min, therapeutic aPTT.  Goal aPTT =50-90 sec  46 yo M on coumadin PTA for hx Afib (CHADsVASc = 2; HTN and vascular disease); on coumadin since 2004), admitted 1/10 for left upper arm I&D. Coumadin has been on hold and pharmacy has been dosing argatroban for bridging with possible history of HIT.  Pharmacy consulted to resume argatroban with coumadin post op on 1/17.  Per vascular may discontinue argatroban once INR is > 4. .   Goal of Therapy:  Goal aPTT =50-90 sec Goal INR= 4-6 while on argatroban Monitor platelets by anticoagulation protocol: Yes   Plan: Continue IV argatroban 0.2 mcg/kg/min Daily aPTT, PT/INR and CBC Coumadin dose already ordered for today; see previous note. -Consider weighing benefit/risk of apixiban   Nicole Cella, RPh Clinical Pharmacist Pager: [phone] 04/22/2016 4:42 PM

## 2016-04-22 NOTE — Care Management Note (Addendum)
Case Management Note Marvetta Gibbons RN, BSN Unit 2W-Case Manager [phone]  Patient Details  Name: Corey [name] MRN: [id number] Date of Birth: 16-Oct-1970  Subjective/Objective:   Pt admitted with Nonhealing surgical wound- s/p I&D with wound VAC placement                 Action/Plan: PTA pt lived at home with brother- orders have been placed for [facility] and home wound VAC- have received call from [place] with [facility]- pre-op referral has been made to them for [facility] services- have placed [facility] wound VAC form on shadow chart for [facility]- will fax to [place] with [employer] once signed. CM to follow for continued d/c arrangements. Spoke with pt at bedside who is agreeable to above arrangements- choice was offered for [facility] services- pt states he is ok using [facility].   Expected Discharge Date:                  Expected Discharge Plan:  [place]  In-House Referral:     Discharge planning Services  CM Consult  Post Acute Care Choice:  Durable Medical Equipment, Home Health Choice offered to:  Patient  DME Arranged:  Vac DME Agency:  [facility]  HH Arranged:  RN [place] Agency:  [place]  Status of Service:  In process, will continue to follow  If discussed at Long Length of Stay Meetings, dates discussed:    Additional Comments:  04/22/16- 1400- Marvetta Gibbons RN, CM- faxed signed [employer] form to [place] with [employer]- for home wound VAC approval   04/21/16- 1600- Trevar Boehringer RN, CM- pt will need to have INR greater than 4 for discharge- have spoken to Tiffany at [facility] who is following for [facility] needs- order has been placed for [facility] for wound VAC needs- spoke with PA-M. Collins- regarding wound VAC form needs to be signed to start approval process. Will fax to [facility] for home wound VAC once signed by PA or MD.   Dawayne Patricia, RN 04/22/2016, 12:29 PM

## 2016-04-23 LAB — CBC
HEMATOCRIT: 29 % — AB (ref 39.0–52.0)
HEMOGLOBIN: 8.8 g/dL — AB (ref 13.0–17.0)
MCH: 29.2 pg (ref 26.0–34.0)
MCHC: 30.3 g/dL (ref 30.0–36.0)
MCV: 96.3 fL (ref 78.0–100.0)
Platelets: 148 10*3/uL — ABNORMAL LOW (ref 150–400)
RBC: 3.01 MIL/uL — AB (ref 4.22–5.81)
RDW: 16.6 % — ABNORMAL HIGH (ref 11.5–15.5)
WBC: 5.2 10*3/uL (ref 4.0–10.5)

## 2016-04-23 LAB — PROTIME-INR
INR: 2.67
Prothrombin Time: 29 seconds — ABNORMAL HIGH (ref 11.4–15.2)

## 2016-04-23 LAB — RENAL FUNCTION PANEL
Albumin: 2.3 g/dL — ABNORMAL LOW (ref 3.5–5.0)
Anion gap: 27 — ABNORMAL HIGH (ref 5–15)
BUN: 11 mg/dL (ref 6–20)
CO2: 25 mmol/L (ref 22–32)
Calcium: 6.2 mg/dL — CL (ref 8.9–10.3)
Chloride: 99 mmol/L — ABNORMAL LOW (ref 101–111)
Creatinine, Ser: 6.2 mg/dL — ABNORMAL HIGH (ref 0.61–1.24)
GFR calc Af Amer: 11 mL/min — ABNORMAL LOW (ref >60)
GFR calc non Af Amer: 10 mL/min — ABNORMAL LOW (ref >60)
Glucose, Bld: 92 mg/dL (ref 65–99)
Phosphorus: 2.6 mg/dL (ref 2.5–4.6)
Potassium: 3.3 mmol/L — ABNORMAL LOW (ref 3.5–5.1)
Sodium: 151 mmol/L — ABNORMAL HIGH (ref 135–145)

## 2016-04-23 LAB — APTT: APTT: 78 s — AB (ref 24–36)

## 2016-04-23 MED ORDER — ANTICOAGULANT SODIUM CITRATE 4% (200MG/5ML) IV SOLN
5.0000 mL | Freq: Once | Status: AC
  Administered 2016-04-23: 5 mL via INTRAVENOUS
  Filled 2016-04-23: qty 250

## 2016-04-23 MED ORDER — LIDOCAINE HCL (PF) 1 % IJ SOLN: 5.0000 mL | INTRAMUSCULAR | Status: DC | PRN

## 2016-04-23 MED ORDER — PENTAFLUOROPROP-TETRAFLUOROETH EX AERO: 1.0000 "application " | INHALATION_SPRAY | CUTANEOUS | Status: DC | PRN

## 2016-04-23 MED ORDER — SODIUM CHLORIDE 0.9 % IV SOLN
100.0000 mL | INTRAVENOUS | Status: DC | PRN
Start: 2016-04-23 — End: 2016-04-23

## 2016-04-23 MED ORDER — SODIUM CHLORIDE 0.9 % IV SOLN: 100.0000 mL | INTRAVENOUS | Status: DC | PRN

## 2016-04-23 MED ORDER — LIDOCAINE-PRILOCAINE 2.5-2.5 % EX CREA: 1.0000 "application " | TOPICAL_CREAM | CUTANEOUS | Status: DC | PRN

## 2016-04-23 MED ORDER — WARFARIN SODIUM 7.5 MG PO TABS
7.5000 mg | ORAL_TABLET | Freq: Once | ORAL | Status: DC
  Filled 2016-04-23: qty 1

## 2016-04-23 MED ORDER — WARFARIN SODIUM 10 MG PO TABS
10.0000 mg | ORAL_TABLET | Freq: Once | ORAL | Status: AC
  Administered 2016-04-23: 10 mg via ORAL
  Filled 2016-04-23 (×2): qty 1

## 2016-04-23 NOTE — Progress Notes (Addendum)
  Progress Note    04/23/2016 12:11 PM 3 Days Post-Op  Subjective:  C/o pain in his left arm; says he still has some numbness in his left hand.  Afebrile VSS  Vitals:   04/23/16 1030 04/23/16 1045  BP: 119/84 128/79  Pulse: 75 71  Resp:  12  Temp:  98.1 F (36.7 C)    Physical Exam: Lungs:  Non labored Incisions:  Wound vac with good seal Extremities:  Easily palpable left radial pulse.  Good grip strength left hand.   CBC    Component Value Date/Time   WBC 5.2 04/23/2016 0707   RBC 3.01 (L) 04/23/2016 0707   HGB 8.8 (L) 04/23/2016 0707   HCT 29.0 (L) 04/23/2016 0707   PLT 148 (L) 04/23/2016 0707   MCV 96.3 04/23/2016 0707   MCH 29.2 04/23/2016 0707   MCHC 30.3 04/23/2016 0707   RDW 16.6 (H) 04/23/2016 0707    BMET    Component Value Date/Time   NA 151 (H) 04/23/2016 0708   K 3.3 (L) 04/23/2016 0708   CL 99 (L) 04/23/2016 0708   CO2 25 04/23/2016 0708   GLUCOSE 92 04/23/2016 0708   BUN 11 04/23/2016 0708   CREATININE 6.20 (H) 04/23/2016 0708   CALCIUM 6.2 (LL) 04/23/2016 0708   GFRNONAA 10 (L) 04/23/2016 0708   GFRAA 11 (L) 04/23/2016 0708    INR    Component Value Date/Time   INR 2.67 04/23/2016 0934     Intake/Output Summary (Last 24 hours) at 04/23/16 1211 Last data filed at 04/23/16 1045  Gross per 24 hour  Intake              480 ml  Output             1535 ml  Net            -1055 ml     Assessment:  46 y.o. male is s/p:  Ligation of left BVT and placement of wound vac  3 Days Post-Op  Plan: -pt just off HD and doing well.  Continues to have pain.  He states he is trying not to take the pain medication b/c he wants to go home.  Encouraged him to [place] po pain medication as it does lasts longer.   Says he doesn't think it works well as he has built up a tolerance. -INR is sub-therapuetic today.  Continue argatroban until INR is 4.0 -hgb stable -for wound vac change on Monday -encourage mobilization and OOB to chair   Leontine Locket, PA-C [facility] [phone] 04/23/2016 12:11 PM  Agree with above.  Discussions with pharmacy regarding Eliquis versus coumadin.  Not really sure of the motivation for switching as patient has been on coumadin for at least 5 months with no difficulties.  He is on this for afib.  No vascular contraindications to switching but I was not part of the original decision making process in this complicated patient and feel this can be an outpt decision with his cardiologist or whomever has been managing his coumadin in the past.  Dr Jason Nest note says he will discuss with cardiology on 1/18 but no further follow up on this.  Ruta Hinds, MD [facility] Office: [phone] Pager: [phone]

## 2016-04-23 NOTE — Procedures (Signed)
I have seen and examined this patient and agree with the plan of care   BP 129/82   Hb 8.7  Darbepoietin 244mcg  1/16   Ca 7.6   Cinacalcet 120mg     Left arm calciphylaxis   Coumadin discontinued  And patient on sodium thiosulfate Terri Skains      Edrick Oh W 04/23/2016, 7:23 AM

## 2016-04-23 NOTE — Progress Notes (Signed)
CRITICAL VALUE ALERT  Critical value received:  6.2  Date of notification:  04/23/2016 Time of notification:  0754  Critical value read back: yes  Nurse who received alert:  Wallace Cullens   MD notified (1st page):  Dr. Hollie Salk   Time of first page:  0754  MD notified (2nd page):  Time of second page:  Responding MD:  Dr. Hollie Salk   Time MD responded:  [phone]

## 2016-04-23 NOTE — Progress Notes (Addendum)
ANTICOAGULATION CONSULT NOTE - Follow Up Consult  Pharmacy Consult for Argatroban and Warfarin Indication: atrial fibrillation  Allergies  Allergen Reactions  . Heparin Other (See Comments)    UNSPECIFIED REACTION :  On Coumadin since 2004 ? ? HIT ? ?   Patient Measurements: Height: 6\' 2"  (188 cm) Weight: 187 lb 3.2 oz (84.9 kg) IBW/kg (Calculated) : 82.2  Vital Signs: Temp: 98.2 F (36.8 C) (01/20 0648) Temp Source: Oral (01/20 0648) BP: 129/85 (01/20 0930) Pulse Rate: 71 (01/20 0930)  Labs:  Recent Labs  04/21/16 0402 04/22/16 0523 04/22/16 1200 04/23/16 0707 04/23/16 0708 04/23/16 0934  HGB  --   --   --  8.8*  --   --   HCT  --   --   --  29.0*  --   --   PLT  --   --   --  148*  --   --   APTT 87* 100* 87*  --   --  78*  LABPROT 24.2* 29.8*  --   --   --  29.0*  INR 2.13 2.76  --   --   --  2.67  CREATININE  --   --   --   --  6.20*  --    Estimated Creatinine Clearance: 17.5 mL/min (by C-G formula based on SCr of 6.2 mg/dL (H)).  Assessment: 46 yo M on coumadin PTA for hx Afib (CHADsVASc = 2; HTN and vascular disease); on coumadin since 2004, admitted 1/10 for left upper arm I&D. Coumadin was on hold and pharmacy has been dosing argatroban for bridging with possible history of HIT.  Pharmacy consulted to resume argatroban with coumadin post op on 1/17.  Per vascular may discontinue argatroban once INR is > 4.  PTA warfarin dose: 7.5mg  daily   Daily aPTT is therapeutic at 78 and INR remains subtherapeutic at 2.67 (decrease from yesterday). Hemoglobin is low/stable at 8.8 and platelets remain low/stable at 148. No bleeding noted.  Patient received boost warfarin dose yesterday of 10mg , will likely see effects of this within next couple of days.   Goal of Therapy:  Goal aPTT =50-90 sec Goal INR= 4-6 while on argatroban Monitor platelets by anticoagulation protocol: Yes   Plan:  Continue IV argatroban 0.2 mcg/kg/min Warfarin 10mg  PO x1 Daily aPTT, PT/INR  and CBC Monitor signs/symptoms of bleeding.   Demetrius Charity, PharmD Acute Care Pharmacy Resident  Pager: [phone] 04/23/2016

## 2016-04-24 LAB — CBC
HCT: 29.3 % — ABNORMAL LOW (ref 39.0–52.0)
HEMOGLOBIN: 9.3 g/dL — AB (ref 13.0–17.0)
MCH: 29.3 pg (ref 26.0–34.0)
MCHC: 31.7 g/dL (ref 30.0–36.0)
MCV: 92.4 fL (ref 78.0–100.0)
Platelets: 164 10*3/uL (ref 150–400)
RBC: 3.17 MIL/uL — AB (ref 4.22–5.81)
RDW: 16.4 % — ABNORMAL HIGH (ref 11.5–15.5)
WBC: 6.3 10*3/uL (ref 4.0–10.5)

## 2016-04-24 LAB — APTT
APTT: 94 s — AB (ref 24–36)
APTT: 48 s — AB (ref 24–36)
APTT: 80 s — AB (ref 24–36)
aPTT: 95 seconds — ABNORMAL HIGH (ref 24–36)

## 2016-04-24 LAB — PROTIME-INR
INR: 3.24
INR: 3.97
Prothrombin Time: 33.8 seconds — ABNORMAL HIGH (ref 11.4–15.2)
Prothrombin Time: 39.7 seconds — ABNORMAL HIGH (ref 11.4–15.2)

## 2016-04-24 LAB — RENAL FUNCTION PANEL
Albumin: 2.5 g/dL — ABNORMAL LOW (ref 3.5–5.0)
Anion gap: 18 — ABNORMAL HIGH (ref 5–15)
BUN: 9 mg/dL (ref 6–20)
CO2: 23 mmol/L (ref 22–32)
Calcium: 8.3 mg/dL — ABNORMAL LOW (ref 8.9–10.3)
Chloride: 101 mmol/L (ref 101–111)
Creatinine, Ser: 5.16 mg/dL — ABNORMAL HIGH (ref 0.61–1.24)
GFR calc Af Amer: 14 mL/min — ABNORMAL LOW (ref >60)
GFR calc non Af Amer: 12 mL/min — ABNORMAL LOW (ref >60)
Glucose, Bld: 92 mg/dL (ref 65–99)
Phosphorus: 1.8 mg/dL — ABNORMAL LOW (ref 2.5–4.6)
Potassium: 3.9 mmol/L (ref 3.5–5.1)
Sodium: 142 mmol/L (ref 135–145)

## 2016-04-24 MED ORDER — PRO-STAT SUGAR FREE PO LIQD
30.0000 mL | Freq: Two times a day (BID) | ORAL | Status: DC
  Administered 2016-04-24: 30 mL via ORAL
  Filled 2016-04-24 (×4): qty 30

## 2016-04-24 MED ORDER — WARFARIN SODIUM 10 MG PO TABS
10.0000 mg | ORAL_TABLET | Freq: Once | ORAL | Status: AC
  Administered 2016-04-24: 10 mg via ORAL
  Filled 2016-04-24: qty 1

## 2016-04-24 MED ORDER — CINACALCET HCL 30 MG PO TABS
90.0000 mg | ORAL_TABLET | Freq: Every day | ORAL | Status: DC
  Administered 2016-04-24 – 2016-04-26 (×3): 90 mg via ORAL
  Filled 2016-04-24 (×3): qty 3

## 2016-04-24 MED ORDER — RENA-VITE PO TABS
1.0000 | ORAL_TABLET | Freq: Every day | ORAL | Status: DC
  Administered 2016-04-24 – 2016-04-26 (×3): 1 via ORAL
  Filled 2016-04-24 (×3): qty 1

## 2016-04-24 MED ORDER — GABAPENTIN 100 MG PO CAPS
100.0000 mg | ORAL_CAPSULE | Freq: Three times a day (TID) | ORAL | Status: DC
  Administered 2016-04-24 – 2016-04-27 (×10): 100 mg via ORAL
  Filled 2016-04-24 (×11): qty 1

## 2016-04-24 NOTE — Progress Notes (Addendum)
  Progress Note    04/24/2016 8:14 AM 4 Days Post-Op  Subjective:  Continues to have pain.  He has only taken Morphine for pain relief.  He had it before going to sleep last night.  He has not taken any percocet as he says he has developed a tolerance for it.  He has been on pain medicine since 2004 and says it does not work.  He states he was in a pain clinic in [place] before moving to [place].  He is not in a pain clinic here.   Afebrile HR  38'S-50'N NSR 397'Q systolic 73% 4LP3XT  Vitals:   04/23/16 2005 04/24/16 0519  BP: (!) 143/88 (!) 142/90  Pulse: 71 69  Resp: 18 18  Temp: 98.3 F (36.8 C) 98.1 F (36.7 C)    Physical Exam: Cardiac:  regluar Lungs:  Non labored Incisions:  Wound vac with good seal. Extremities:  Easily palpable left radial pulse.   CBC    Component Value Date/Time   WBC 5.2 04/23/2016 0707   RBC 3.01 (L) 04/23/2016 0707   HGB 8.8 (L) 04/23/2016 0707   HCT 29.0 (L) 04/23/2016 0707   PLT 148 (L) 04/23/2016 0707   MCV 96.3 04/23/2016 0707   MCH 29.2 04/23/2016 0707   MCHC 30.3 04/23/2016 0707   RDW 16.6 (H) 04/23/2016 0707    BMET    Component Value Date/Time   NA 151 (H) 04/23/2016 0708   K 3.3 (L) 04/23/2016 0708   CL 99 (L) 04/23/2016 0708   CO2 25 04/23/2016 0708   GLUCOSE 92 04/23/2016 0708   BUN 11 04/23/2016 0708   CREATININE 6.20 (H) 04/23/2016 0708   CALCIUM 6.2 (LL) 04/23/2016 0708   GFRNONAA 10 (L) 04/23/2016 0708   GFRAA 11 (L) 04/23/2016 0708    INR    Component Value Date/Time   INR 3.24 04/24/2016 0425     Intake/Output Summary (Last 24 hours) at 04/24/16 0814 Last data filed at 04/24/16 0612  Gross per 24 hour  Intake              130 ml  Output             1550 ml  Net            -1420 ml     Assessment:  46 y.o. male is s/p:  Ligation of left BVT and placement of wound vac  4 Days Post-Op  Plan: -wound vac with good seal and for vac change tomorrow.  -DVT prophylaxis:  Argatroban bridge to coumadin.  INR  is 3.24 today.  Continue coumadin. -will try Percocet today to get some longer lasting relief if pt willing to try this.   Leontine Locket, PA-C [facility] [phone] 04/24/2016 8:14 AM  Agree with above  Ruta Hinds, MD [facility] [place] [facility] Office: [phone] Pager: [phone]

## 2016-04-24 NOTE — Progress Notes (Signed)
ANTICOAGULATION CONSULT NOTE - Follow Up Consult  Pharmacy Consult for Argatroban and Warfarin Indication: atrial fibrillation  Allergies  Allergen Reactions  . Heparin Other (See Comments)    UNSPECIFIED REACTION :  On Coumadin since 2004 ? ? HIT ? ?   Patient Measurements: Height: 6\' 2"  (188 cm) Weight: 184 lb 8 oz (83.7 kg) IBW/kg (Calculated) : 82.2  Vital Signs: Temp: 98.1 F (36.7 C) (01/21 0519) Temp Source: Oral (01/21 0519) BP: 142/90 (01/21 0519) Pulse Rate: 69 (01/21 0519)  Labs:  Recent Labs  04/22/16 0523 04/22/16 1200 04/23/16 0707 04/23/16 0708 04/23/16 0934 04/24/16 0425  HGB  --   --  8.8*  --   --   --   HCT  --   --  29.0*  --   --   --   PLT  --   --  148*  --   --   --   APTT 100* 87*  --   --  78* 94*  LABPROT 29.8*  --   --   --  29.0* 33.8*  INR 2.76  --   --   --  2.67 3.24  CREATININE  --   --   --  6.20*  --   --    Estimated Creatinine Clearance: 17.5 mL/min (by C-G formula based on SCr of 6.2 mg/dL (H)).  Assessment: 46 yo M on coumadin PTA for hx Afib (CHADsVASc = 2; HTN and vascular disease); on coumadin since 2004, admitted 1/10 for left upper arm I&D. Coumadin was on hold and pharmacy has been dosing argatroban for bridging with possible history of HIT.  Pharmacy consulted to resume argatroban with coumadin post op on 1/17.  Per vascular may discontinue argatroban once INR is > 4.  PTA warfarin dose: 7.5mg  daily   Daily aPTT is supratherapeutic at 94 and INR is still subtherapeutic at 3.24, up from yesterday. We starting to see the effects of 10mg  dose for the last two days. No Repeat CBC results yet.  No bleeding noted per nurse.  As argatroban is supratherapeutic, will decrease dose per protocol. Will also continue higher dose coumadin to push INR to 10mg . Depending on INR tomorrow, will decrease back to home dose.   Goal of Therapy:  Goal aPTT =50-90 sec Goal INR= 4-6 while on argatroban Monitor platelets by anticoagulation  protocol: Yes   Plan:  Decrease IV argatroban to 0.15 mcg/kg/min Warfarin 10mg  PO x1 Repeat aPTT in 2 hours Daily aPTT, PT/INR and CBC Monitor signs/symptoms of bleeding.   Dierdre Harness, Cain Sieve, PharmD Clinical Pharmacy Resident [phone] (Pager) 04/24/2016 7:35 AM

## 2016-04-24 NOTE — Progress Notes (Signed)
[facility] for Argatroban and Warfarin Indication: atrial fibrillation  Allergies  Allergen Reactions  . Heparin Other (See Comments)    UNSPECIFIED REACTION :  On Coumadin since 2004 ? ? HIT ? ?   Patient Measurements: Height: 6\' 2"  (188 cm) Weight: 184 lb 8 oz (83.7 kg) IBW/kg (Calculated) : 82.2  Vital Signs: Temp: 98.3 F (36.8 C) (01/21 2039) Temp Source: Oral (01/21 2039) BP: 147/91 (01/21 2039) Pulse Rate: 66 (01/21 2039)  Labs:  Recent Labs  04/23/16 0707 04/23/16 0708 04/23/16 0934 04/24/16 0425 04/24/16 1110 04/24/16 1322 04/24/16 1827  HGB 8.8*  --   --   --  9.3*  --   --   HCT 29.0*  --   --   --  29.3*  --   --   PLT 148*  --   --   --  164  --   --   APTT  --   --  78* 94* 95* 48* 80*  LABPROT  --   --  29.0* 33.8*  --   --  39.7*  INR  --   --  2.67 3.24  --   --  3.97  CREATININE  --  6.20*  --   --  5.16*  --   --    Estimated Creatinine Clearance: 21 mL/min (by C-G formula based on SCr of 5.16 mg/dL (H)).  Assessment: 46 yo M on coumadin PTA for hx Afib (CHADsVASc = 2; HTN and vascular disease); on coumadin since 2004, admitted 1/10 for left upper arm I&D. Coumadin was on hold and pharmacy has been dosing argatroban for bridging with possible history of HIT.  Pharmacy consulted to resume argatroban with coumadin post op on 1/17.  Per vascular may discontinue argatroban once INR is > 4. PTA warfarin dose: 7.5mg  daily   aPTT therapeutic x2 INR 3.97, can likely stop bridge tomorrow.  Goal of Therapy:  Goal aPTT =50-90 sec Goal INR= 4-6 while on argatroban Monitor platelets by anticoagulation protocol: Yes   Plan:  Continue argatroban at 0.2 mcg/kg/min  Daily aPTT, PT/INR and CBC Monitor signs/symptoms of bleeding F/u am INR, may be able to stop argatroban  [name], PharmD Clinical Pharmacist Pager: [phone] 04/24/2016 9:26 PM

## 2016-04-24 NOTE — Progress Notes (Signed)
Na 151. Labs have been drawn from HD catheter that has been flushed with Sodium Citrate.   Corey [name] [phone] (Pager)

## 2016-04-24 NOTE — Progress Notes (Signed)
[facility] Progress Note   Subjective: "I didn't sleep well last night. I couldn't get the pain down long enough". Has chronic LE neuropathy plus issues with calciphylaxis. Primary working with pt on pain issues.   Objective Vitals:   04/23/16 1045 04/23/16 1401 04/23/16 2005 04/24/16 0519  BP: 128/79 130/81 (!) 143/88 (!) 142/90  Pulse: 71 73 71 69  Resp: 12 20 18 18   Temp: 98.1 F (36.7 C) 98.2 F (36.8 C) 98.3 F (36.8 C) 98.1 F (36.7 C)  TempSrc: Oral Oral Oral Oral  SpO2: 95% 99% 99% 97%  Weight:    83.7 kg (184 lb 8 oz)  Height:       Physical Exam General: Pleasant, NAD Heart: S1, S2 1/6 systolic M.  Lungs: CTAB, no WOB Abdomen: soft, active BS Extremities: woody appearance, very dry skin. No edema.  Dialysis Access: Has wound vac LUA at AVF ligation site. Fair amount sanguineness drainage. RIJ TDC Drsg CDI.     Additional Objective Labs: Basic Metabolic Panel:  Recent Labs Lab 04/19/16 0700 04/20/16 0523 04/23/16 0708  NA 141 136 151*  K 3.9 3.7 3.3*  CL 93* 97* 99*  CO2 23 22 25   GLUCOSE 82 87 92  BUN 35* 13 11  CREATININE 8.53* 5.21* 6.20*  CALCIUM 7.0* 7.6* 6.2*  PHOS 3.9  --  2.6   Liver Function Tests:  Recent Labs Lab 04/19/16 0700 04/23/16 0708  ALBUMIN 2.6* 2.3*   No results for input(s): LIPASE, AMYLASE in the last 168 hours. CBC:  Recent Labs Lab 04/18/16 0430 04/19/16 0715 04/20/16 0523 04/23/16 0707  WBC 5.6 4.9 5.4 5.2  HGB 8.6* 8.1* 8.7* 8.8*  HCT 27.6* 26.3* 28.4* 29.0*  MCV 96.8 97.0 97.3 96.3  PLT 154 140* 147* 148*   Blood Culture    Component Value Date/Time   SDES ABSCESS LEFT UPPER ARM 04/13/2016 1135   SPECREQUEST NONE 04/13/2016 1135   CULT  04/13/2016 1135    ABUNDANT STAPHYLOCOCCUS AUREUS RARE CANDIDA PARAPSILOSIS NO ANAEROBES ISOLATED    REPTSTATUS 04/18/2016 FINAL 04/13/2016 1135    Cardiac Enzymes: No results for input(s): CKTOTAL, CKMB, CKMBINDEX, TROPONINI in the last 168  hours. CBG:  Recent Labs Lab 04/20/16 1151  GLUCAP 87   Iron Studies: No results for input(s): IRON, TIBC, TRANSFERRIN, FERRITIN in the last 72 hours. @lablastinr3 @ Studies/Results: No results found. Medications: . sodium chloride 10 mL/hr at 04/20/16 0952  . argatroban 0.15 mcg/kg/min (04/24/16 0816)   . cinacalcet  120 mg Oral Q supper  . darbepoetin (ARANESP) injection - DIALYSIS  100 mcg Intravenous Q Tue-HD  . gabapentin  100 mg Oral QHS  . pantoprazole  40 mg Oral Daily  . sevelamer carbonate  800 mg Oral TID WC  . sodium chloride flush  3 mL Intravenous Q12H  . sodium thiosulfate infusion for calciphylaxis  25 g Intravenous Q T,Th,Sa-HD  . warfarin  10 mg Oral ONCE-1800  . Warfarin - Pharmacist Dosing Inpatient   Does not apply q1800     OP Dialysis Orders: [facility] 3h 8min 87 kg= EDW Bath= 2 k , 2.25ca R IJ cath Hep none (allergic to heparin, using citrate) IV Fe load in progress Hec 5 mcg No Mircera  op labs= HGB 11.2 PTH 903 on 03/31/16 Ca 8.6 phos 5.1   Problem/Plan: 1. Left arm Calciphylaxis wound and steal syndrome -Sp Ligation AVF 04/20/16 / wound vac / Plastic surg Consulting. Having moderate amount of drainage in wound vac.  On Coumadin which is contraindicated with Calciphylaxis in ESRD pt, bridging with argatroban. Dr. [name] wants coumadin D/T being able to reverse coumadin in the event of bleeding complications. Discussed with pharmacy as apixaban would be preferred agent. Sodium thiosulfate on hd /sensipar.  2. ESRD - HD on TTS. HD 04/23/16 on schedule. Next HD Tuesday. K+ 3.3 Use 4.0 k bath with HD next tx.  3. Anemia - HGB 8.8 04/20/16. aranesp 100 mcg q tues hd now / FE Weekly on HD  4. Secondary hyperparathyroidism - Phos 2.6  Hold binders. Last pth 903  Ca 6.2 C Ca 7.56 reduce sensipar dose to 90 mg PO q PM. Ca should probably be around 8-8.5 with. Calciphylaxis. VDRA on hold.  5.  HTN/volume HD yesterday. Post wt 83.7 kg.  Is  under OP EDW. Adjust EDW on DC. Na ^ 151 Will recheck renal function today.  6. Hx aortic dissection / repair '04 7. Paralysis - complications of Aortic Dissection , is WC dependent however he says he can stand with crutches.  8. Chronic Pain: Per primary. Patient says he attended pain clinic prior to moving to [facility]. Consider referring to pain clinic when [facility] home. Will increase Gabapentin to 300mg  daily-doubt this will have a huge impact but may help some. 9. Nutrition: Albumin 2.3. Severe PCM. Renal diet, add prostat, renal vit.   Corey [name] H. Corey Godette NP-C 04/24/2016, 8:46 AM  [employer] [phone]

## 2016-04-24 NOTE — Progress Notes (Signed)
ANTICOAGULATION CONSULT NOTE - Follow Up Consult  Pharmacy Consult for Argatroban and Warfarin Indication: atrial fibrillation  Allergies  Allergen Reactions  . Heparin Other (See Comments)    UNSPECIFIED REACTION :  On Coumadin since 2004 ? ? HIT ? ?   Patient Measurements: Height: 6\' 2"  (188 cm) Weight: 184 lb 8 oz (83.7 kg) IBW/kg (Calculated) : 82.2  Vital Signs: Temp: 97.8 F (36.6 C) (01/21 1451) Temp Source: Oral (01/21 1451) BP: 131/80 (01/21 1451) Pulse Rate: 73 (01/21 1451)  Labs:  Recent Labs  04/22/16 0523  04/23/16 0707 04/23/16 0708 04/23/16 0934 04/24/16 0425 04/24/16 1110 04/24/16 1322  HGB  --   --  8.8*  --   --   --  9.3*  --   HCT  --   --  29.0*  --   --   --  29.3*  --   PLT  --   --  148*  --   --   --  164  --   APTT 100*  < >  --   --  78* 94* 95* 48*  LABPROT 29.8*  --   --   --  29.0* 33.8*  --   --   INR 2.76  --   --   --  2.67 3.24  --   --   CREATININE  --   --   --  6.20*  --   --  5.16*  --   < > = values in this interval not displayed. Estimated Creatinine Clearance: 21 mL/min (by C-G formula based on SCr of 5.16 mg/dL (H)).  Assessment: 46 yo M on coumadin PTA for hx Afib (CHADsVASc = 2; HTN and vascular disease); on coumadin since 2004, admitted 1/10 for left upper arm I&D. Coumadin was on hold and pharmacy has been dosing argatroban for bridging with possible history of HIT.  Pharmacy consulted to resume argatroban with coumadin post op on 1/17.  Per vascular may discontinue argatroban once INR is > 4. PTA warfarin dose: 7.5mg  daily   Of note, labs were being collected from HD port and aPTT reports have been supratherapeutic. The morning RPh decreased dose based on labs, however repeat aPTT increased after his adjustment. The provider has suggested not using the HD port to collect further labs. A repeat aPTT collected peripherally yielded a subtherapeutic level, which is felt to be accurate. The plan will be to increase rate back the  same rate when labs were previously therapeutic. Will order confirmatory labs after rate increase and also order INR to confirm no errors with correlating labs.   Patient should receive warfarin 10mg  tonight. CBC stable; no bleeding or infusion related issues.  Goal of Therapy:  Goal aPTT =50-90 sec Goal INR= 4-6 while on argatroban Monitor platelets by anticoagulation protocol: Yes   Plan:  Increase argatroban to 0.20 mcg/kg/min  Warfarin 10mg  PO x1 Repeat aPTT in 2 hours Daily aPTT, PT/INR and CBC Monitor signs/symptoms of bleeding.   [name], PharmD Clinical Pharmacist Pager: [phone] 04/24/2016 3:39 PM

## 2016-04-24 NOTE — Progress Notes (Signed)
[place] for Argatroban and Warfarin Indication: atrial fibrillation  Allergies  Allergen Reactions  . Heparin Other (See Comments)    UNSPECIFIED REACTION :  On Coumadin since 2004 ? ? HIT ? ?   Patient Measurements: Height: 6\' 2"  (188 cm) Weight: 184 lb 8 oz (83.7 kg) IBW/kg (Calculated) : 82.2  Vital Signs: Temp: 97.8 F (36.6 C) (01/21 1451) Temp Source: Oral (01/21 1451) BP: 131/80 (01/21 1451) Pulse Rate: 73 (01/21 1451)  Labs:  Recent Labs  04/23/16 0707 04/23/16 0708 04/23/16 0934 04/24/16 0425 04/24/16 1110 04/24/16 1322 04/24/16 1827  HGB 8.8*  --   --   --  9.3*  --   --   HCT 29.0*  --   --   --  29.3*  --   --   PLT 148*  --   --   --  164  --   --   APTT  --   --  78* 94* 95* 48* 80*  LABPROT  --   --  29.0* 33.8*  --   --  39.7*  INR  --   --  2.67 3.24  --   --  3.97  CREATININE  --  6.20*  --   --  5.16*  --   --    Estimated Creatinine Clearance: 21 mL/min (by C-G formula based on SCr of 5.16 mg/dL (H)).  Assessment: 46 yo M on coumadin PTA for hx Afib (CHADsVASc = 2; HTN and vascular disease); on coumadin since 2004, admitted 1/10 for left upper arm I&D. Coumadin was on hold and pharmacy has been dosing argatroban for bridging with possible history of HIT.  Pharmacy consulted to resume argatroban with coumadin post op on 1/17.  Per vascular may discontinue argatroban once INR is > 4. PTA warfarin dose: 7.5mg  daily   Afternoon aptt is therapeutic. INR is 3.97. Will continue argatroban, may be able to discontinue argatroban pending morning INR.    Goal of Therapy:  Goal aPTT =50-90 sec Goal INR= 4-6 while on argatroban Monitor platelets by anticoagulation protocol: Yes   Plan:  Continue argatroban at 0.2 mcg/kg/min  Repeat aPTT in 4 hours Daily aPTT, PT/INR and CBC Monitor signs/symptoms of bleeding F/u am INR, may be able to stop argatroban    Hughes Better, PharmD, BCPS Clinical  Pharmacist 04/24/2016 7:47 PM

## 2016-04-25 ENCOUNTER — Telehealth: Payer: Self-pay | Admitting: Vascular Surgery

## 2016-04-25 LAB — PROTIME-INR
INR: 4.99 — AB
INR: 5.32
INR: 3.98
PROTHROMBIN TIME: 47.8 s — AB (ref 11.4–15.2)
PROTHROMBIN TIME: 39.8 s — AB (ref 11.4–15.2)
Prothrombin Time: 50.3 seconds — ABNORMAL HIGH (ref 11.4–15.2)

## 2016-04-25 LAB — CBC
HCT: 29.3 % — ABNORMAL LOW (ref 39.0–52.0)
HEMOGLOBIN: 9.3 g/dL — AB (ref 13.0–17.0)
MCH: 29.1 pg (ref 26.0–34.0)
MCHC: 31.7 g/dL (ref 30.0–36.0)
MCV: 91.6 fL (ref 78.0–100.0)
PLATELETS: 173 10*3/uL (ref 150–400)
RBC: 3.2 MIL/uL — ABNORMAL LOW (ref 4.22–5.81)
RDW: 16.3 % — AB (ref 11.5–15.5)
WBC: 5.6 10*3/uL (ref 4.0–10.5)

## 2016-04-25 LAB — APTT
aPTT: 103 seconds — ABNORMAL HIGH (ref 24–36)
aPTT: 104 seconds — ABNORMAL HIGH (ref 24–36)
aPTT: 65 seconds — ABNORMAL HIGH (ref 24–36)

## 2016-04-25 MED ORDER — NEPRO/CARBSTEADY PO LIQD
237.0000 mL | Freq: Three times a day (TID) | ORAL | Status: DC
  Administered 2016-04-25 – 2016-04-27 (×4): 237 mL via ORAL
  Filled 2016-04-25 (×10): qty 237

## 2016-04-25 MED ORDER — HYDROMORPHONE HCL 2 MG PO TABS
1.0000 mg | ORAL_TABLET | Freq: Every day | ORAL | Status: DC | PRN
  Administered 2016-04-26: 1 mg via ORAL
  Filled 2016-04-25: qty 1

## 2016-04-25 MED ORDER — GABAPENTIN 100 MG PO CAPS: 100.0000 mg | ORAL_CAPSULE | Freq: Three times a day (TID) | ORAL | 0 refills | Status: DC

## 2016-04-25 MED ORDER — WARFARIN SODIUM 5 MG PO TABS
5.0000 mg | ORAL_TABLET | Freq: Once | ORAL | Status: AC
  Administered 2016-04-25: 5 mg via ORAL
  Filled 2016-04-25: qty 1

## 2016-04-25 MED ORDER — OXYCODONE-ACETAMINOPHEN 5-325 MG PO TABS: 1.0000 | ORAL_TABLET | Freq: Three times a day (TID) | ORAL | 0 refills | Status: DC | PRN

## 2016-04-25 NOTE — Care Management Important Message (Signed)
Important Message  Patient Details  Name: Corey [name] MRN: [id number] Date of Birth: 01-03-1971   Medicare Important Message Given:  Yes    Nathen May 04/25/2016, 1:29 PM

## 2016-04-25 NOTE — Progress Notes (Addendum)
[facility] for Argatroban and Warfarin Indication: atrial fibrillation  Allergies  Allergen Reactions  . Heparin Other (See Comments)    UNSPECIFIED REACTION :  On Coumadin since 2004 ? ? HIT ? ?   Patient Measurements: Height: 6\' 2"  (188 cm) Weight: 187 lb 3.2 oz (84.9 kg) IBW/kg (Calculated) : 82.2  Vital Signs: Temp: 97.8 F (36.6 C) (01/22 0612) Temp Source: Oral (01/22 0612) BP: 135/84 (01/22 0612) Pulse Rate: 66 (01/22 0612)  Labs:  Recent Labs  04/23/16 0707 04/23/16 0708  04/24/16 0425 04/24/16 1110 04/24/16 1322 04/24/16 1827 04/25/16 0243  HGB 8.8*  --   --   --  9.3*  --   --  9.3*  HCT 29.0*  --   --   --  29.3*  --   --  29.3*  PLT 148*  --   --   --  164  --   --  173  APTT  --   --   < > 94* 95* 48* 80* 103*  LABPROT  --   --   < > 33.8*  --   --  39.7* 47.8*  INR  --   --   < > 3.24  --   --  3.97 4.99*  CREATININE  --  6.20*  --   --  5.16*  --   --   --   < > = values in this interval not displayed. Estimated Creatinine Clearance: 21 mL/min (by C-G formula based on SCr of 5.16 mg/dL (H)).  Assessment: 46 yo M on coumadin PTA for hx Afib (CHADsVASc = 2; HTN and vascular disease); on coumadin since 2004, admitted 1/10 for left upper arm I&D. Coumadin was on hold and pharmacy has been dosing argatroban for bridging with possible history of HIT.  Pharmacy consulted to resume argatroban with coumadin post op on 1/17. Argatroban discontinued today with plans for discharge after vac change -INR= 4.99 and aPTT= 103.  -INR trend up after coumadin increased to 10mg  from 1/19>>1/21   Goal of Therapy:  Goal aPTT =50-90 sec Goal INR= 4-6 while on argatroban Monitor platelets by anticoagulation protocol: Yes   Plan:  -Will given coumadin 5mg  today (due to INR trend up) -Check INR and aPTT  6 hours after argatroban discontinuation -daily INR  Hildred Laser, Pharm D 04/25/2016 8:15 AM  Addendum -aPTT= 104 and INR= 5.32  about 6 hours after argatroban discontinuation -Argatroban was not discontinued at the time of collection -Argatroban has been discontinued (~ 3:30pm) -Repeat aPTT and INR in 6 hours  Hildred Laser, Pharm D 04/25/2016 2:55 PM

## 2016-04-25 NOTE — Telephone Encounter (Signed)
Spoke to pt, post op 2/2 mailed letter to home address

## 2016-04-25 NOTE — Consult Note (Addendum)
[place] Nurse wound consult note Reason for Consult: Vac dressing change Wound type: Full thickness post-op wound to left upper inner arm Appearance unchanged to both arm wounds since previous assessment. Drainage (amount, consistency, odor) Mod amt pink drainage in the cannister, no odor.  INR level has been elevated, according to the EMR notes.  There is no active bleeding when the dressing was removed. Periwound: Intact skin surrounding Dressing procedure/placement/frequency: Pt was medicated for pain prior to procedure but still was in extreme pain and required dose of IV Morphine to tolerate the dressing change. Reminded him that IV pain meds are not available after discharge for dressing change at home. Applied Mepitel contact layer to decrease pain and adherence of dressing with next Vac change.  Applied one piece black sponge to 133mm cont suction.  Pt was able to tolerate after the second dose of pain medication was given.   Julien Girt MSN, RN, [place], [place], [place]

## 2016-04-25 NOTE — Progress Notes (Signed)
Patient had received dose of PO pain medication prior to wound vac dressing change. Patient in extreme pain during dressing change, Patient given IV morphine as ordered as needed for pain. Gerri Lins PAC in [place] given message of patients pain medication requirements for dressing change will monitor patient. Kathee Tumlin, Bettina Gavia rN

## 2016-04-25 NOTE — Progress Notes (Addendum)
Update regarding Home Wound VAC needs- received call from M. Collins-PA with vascular- per conversation team would like to continue to seek home [facility] approval with the hopes that pt can tolerate drsg change with oral pain medication changes that have been made. Have called [employer] back to request that they take hold off order and continue to review for approval of home wound VAC with plan for d/c on 1/24. Confirmed that they received updated documentation of the OR note regarding dissection and removal of the fistula on 1/17. (spoke with Melody) - CM will f/u with [facility] for home wound VAC approval.

## 2016-04-25 NOTE — Progress Notes (Addendum)
Received call from [employer] regarding need for further documentation for home wound VAC before they will approve due to wound VAC being used around fistula- additional documentation faxed to [phone]- to show OR note of 1/17 that fistula has been removed/dissected out. Notified by PA- M. Collins- to hold off on home wound VAC until further notice- as pt is not tolerating wound VAC drsg changes without IV medications- team to discuss further drsg options- call made to [employer]-([phone]) hold placed on home wound VAC order until further notice (spoke with Angela Nevin).-- Case Management- Marvetta Gibbons RN, BSN- [phone]

## 2016-04-25 NOTE — Progress Notes (Signed)
CRITICAL VALUE STICKER  CRITICAL VALUE: INR- 5.32  RECEIVER (on-site recipient of call): Lab   DATE & TIME NOTIFIED: 04/25/16  3:12 PM   MESSENGER (representative from lab):  MD NOTIFIED: Raynaldo Opitz   TIME OF NOTIFICATION:3:14 PM   RESPONSE: Coming to see Pt. No new order at this time

## 2016-04-25 NOTE — Telephone Encounter (Signed)
-----   Message from Mena Goes, RN sent at 04/25/2016  9:01 AM EST ----- Regarding: schedule 2-3 weeks   ----- Message ----- From: Ulyses Amor, PA-C Sent: 04/25/2016   8:03 AM To: Vvs Charge Pool  F/U with Dr. [name] in 2-3 weeks s/p ligation of left UE fistula and wound debridement.

## 2016-04-25 NOTE — Progress Notes (Signed)
Lab called critical value for INR of 4.99. Called pharmacy to advise since they are managing pt on Argatroban gtt. James in pharmacy advised value ok for someone on this medication and they would continue to monitor. No changes to medication at this time.

## 2016-04-25 NOTE — Progress Notes (Addendum)
[facility] [place]  Subjective  - He is willing to take percocet at home to help with the pain.   Objective 135/84 66 97.8 F (36.6 C) (Oral) 18 97%  Intake/Output Summary (Last 24 hours) at 04/25/16 0732 Last data filed at 04/25/16 0600  Gross per 24 hour  Intake           772.22 ml  Output               75 ml  Net           697.22 ml    Palpable radial pulse left UE Wound vac Intact and working well Heart RRR Lungs non labored   Assessment/Planning: Ligation of left BVT and placement of wound vac  5 Days Post-Op  INR in 4.99 this am He is due for a wound vac change plan to discharge home after vac change Will discharge him on 7.5 mg Coumadin dose daily, stop Argatroban F/U with Coumadin clinic in 1-2 days, Dr. [name] in 2 weeks and Dr. Iran Planas.  HH for RN wound vac.     Laurence Slate [facility] 04/25/2016 7:32 AM --  Laboratory Lab Results:  Recent Labs  04/24/16 1110 04/25/16 0243  WBC 6.3 5.6  HGB 9.3* 9.3*  HCT 29.3* 29.3*  PLT 164 173   BMET  Recent Labs  04/23/16 0708 04/24/16 1110  NA 151* 142  K 3.3* 3.9  CL 99* 101  CO2 25 23  GLUCOSE 92 92  BUN 11 9  CREATININE 6.20* 5.16*  CALCIUM 6.2* 8.3*    COAG Lab Results  Component Value Date   INR 4.99 (HH) 04/25/2016   INR 3.97 04/24/2016   INR 3.24 04/24/2016   No results found for: PTT   Addendum  Pt therapeutic on Coumadin now with Argatroban bridge.  Reportedly patient requiring injectable morphine to tolerate the VAC changes.  - continue coumadin: pre-procedure - will need trial completely PO regimen for VAC dressing dressing prior to D/C as pt likely to bounce back otherwise - D/C morphine (add Dilaudid PO for VAC change) - If this is not adequate, will need to try Aquacel Ag for the dressing instead   [name], MD, FACS [facility] [place]: [phone] Pager: [phone]  04/25/2016, 12:03 PM

## 2016-04-25 NOTE — Progress Notes (Addendum)
Subjective: cos pain after w vac changed/ for hd in am   Objective Vital signs in last 24 hours: Vitals:   04/24/16 1451 04/24/16 2039 04/24/16 2143 04/25/16 0612  BP: 131/80 (!) 147/91 (!) 137/43 135/84  Pulse: 73 66 (!) 56 66  Resp: 18 18 18 18   Temp: 97.8 F (36.6 C) 98.3 F (36.8 C) 98.7 F (37.1 C) 97.8 F (36.6 C)  TempSrc: Oral Oral Oral Oral  SpO2: 99% 98% 95% 97%  Weight:    84.9 kg (187 lb 3.2 oz)  Height:       Weight change: 1.225 kg (2 lb 11.2 oz)  Physical Exam: General: alert AAM chronically  ill appearing NAD OX3 Heart: RRR  No rub, mur , gal Lungs: CTA  non labored breathing  Abdomen:  bs pos.  Soft , NT , ND Extremities: bipedal 2+ edema/ L UA  With wound vac  On Necrotic wound  Dialysis Access: R IJ Permcath     OP Dialysis Orders: TTS South 3h 14min 87 kg= EDW  Bath= 2 k , 2.25ca R IJ cath  Hep none (allergic to heparin, using citrate) IV Fe load in progress   Hec 5 mcg    No Mircera   op labs= HGB  11.2    PTH  903 on 03/31/16  Ca 8.6  phos 5.1   Problem/Plan: 1. Left arm Calciphylaxis wound and steal syndrome -Sp Ligation AVF today / wound vac / was seen by Plastic surg also/noted On Coumadin and contracindicated now with Calciphylaxis in ESRD pt. (would use low dow Eliquis )/ is on  sodium thiosulfate on hd /  Po sensipar  Decreasing some with low phos and low calcium  2. ESRD - HD on TTS  Na 151 1/20 > 142  04/24/16  3. Anemia - HGB 8.1> 8.7  aranesp 100 mcg  q tues hd now / FE  Weekly on HD  4. Secondary hyperparathyroidism - Phos 1.8 1/121/18 holding  Renvela as binder  Last pth 903 03/31/16 on sensipar 120 q supper now 90 mg ,  On 04/24/16 = ca 8.3  Corec ca 9.5 today / Calciphylaxis as above  5. HTN/volume - bp ok  Not on bp meds and stable 98% O2 sat on 3liters [place]  / Still Has pedal edema attempt 2.5 - 3 liter uf tomor hd 6.  Hx aortic dissection / repair '04 7. Paralysis - complications of Aortic Dissection , is WC dependent 8. Chronic Pain:  Per primary. Patient says he attended pain clinic prior to moving to [facility]. Consider referring to pain clinic when [facility] home. 9.  Nutrition: Albumin 2.3. Severe PCM. Renal diet, add prostat, renal vit. / Nepro Ernest Haber, PA-C [facility] [phone] 04/25/2016,11:32 AM  LOS: 12 days   Pt seen, examined and agree w A/P as above.  Kelly Splinter MD [facility] pager [phone]   04/25/2016, 3:24 PM    Labs: Basic Metabolic Panel:  Recent Labs Lab 04/19/16 0700 04/20/16 0523 04/23/16 0708 04/24/16 1110  NA 141 136 151* 142  K 3.9 3.7 3.3* 3.9  CL 93* 97* 99* 101  CO2 23 22 25 23   GLUCOSE 82 87 92 92  BUN 35* 13 11 9   CREATININE 8.53* 5.21* 6.20* 5.16*  CALCIUM 7.0* 7.6* 6.2* 8.3*  PHOS 3.9  --  2.6 1.8*   Liver Function Tests:  Recent Labs Lab 04/19/16 0700 04/23/16 0708 04/24/16 1110  ALBUMIN 2.6* 2.3* 2.5*  CBC:  Recent Labs Lab 04/19/16 0715 04/20/16 0523 04/23/16 0707 04/24/16 1110 04/25/16 0243  WBC 4.9 5.4 5.2 6.3 5.6  HGB 8.1* 8.7* 8.8* 9.3* 9.3*  HCT 26.3* 28.4* 29.0* 29.3* 29.3*  MCV 97.0 97.3 96.3 92.4 91.6  PLT 140* 147* 148* 164 173   Cardiac Enzymes: No results for input(s): CKTOTAL, CKMB, CKMBINDEX, TROPONINI in the last 168 hours. CBG:  Recent Labs Lab 04/20/16 1151  GLUCAP 87    Studies/Results: No results found. Medications: . sodium chloride 10 mL/hr at 04/20/16 0952   . cinacalcet  90 mg Oral Q supper  . darbepoetin (ARANESP) injection - DIALYSIS  100 mcg Intravenous Q Tue-HD  . feeding supplement (PRO-STAT SUGAR FREE 64)  30 mL Oral BID  . gabapentin  100 mg Oral TID  . multivitamin  1 tablet Oral QHS  . pantoprazole  40 mg Oral Daily  . sodium chloride flush  3 mL Intravenous Q12H  . sodium thiosulfate infusion for calciphylaxis  25 g Intravenous Q T,Th,Sa-HD  . Warfarin - Pharmacist Dosing Inpatient   Does not apply [phone]

## 2016-04-26 LAB — HEPATIC FUNCTION PANEL
ALK PHOS: 134 U/L — AB (ref 38–126)
ALT: 7 U/L — AB (ref 17–63)
AST: 15 U/L (ref 15–41)
Albumin: 2.7 g/dL — ABNORMAL LOW (ref 3.5–5.0)
Total Bilirubin: 0.5 mg/dL (ref 0.3–1.2)
Total Protein: 5.8 g/dL — ABNORMAL LOW (ref 6.5–8.1)

## 2016-04-26 LAB — RENAL FUNCTION PANEL
Albumin: 2.5 g/dL — ABNORMAL LOW (ref 3.5–5.0)
Anion gap: 18 — ABNORMAL HIGH (ref 5–15)
BUN: 21 mg/dL — AB (ref 6–20)
CHLORIDE: 103 mmol/L (ref 101–111)
CO2: 22 mmol/L (ref 22–32)
CREATININE: 8.31 mg/dL — AB (ref 0.61–1.24)
Calcium: 7.8 mg/dL — ABNORMAL LOW (ref 8.9–10.3)
GFR calc non Af Amer: 7 mL/min — ABNORMAL LOW (ref >60)
GFR, EST AFRICAN AMERICAN: 8 mL/min — AB (ref >60)
Glucose, Bld: 89 mg/dL (ref 65–99)
PHOSPHORUS: 3.5 mg/dL (ref 2.5–4.6)
POTASSIUM: 4.1 mmol/L (ref 3.5–5.1)
Sodium: 143 mmol/L (ref 135–145)

## 2016-04-26 LAB — CBC
HEMATOCRIT: 27.2 % — AB (ref 39.0–52.0)
Hemoglobin: 8.9 g/dL — ABNORMAL LOW (ref 13.0–17.0)
MCH: 30 pg (ref 26.0–34.0)
MCHC: 32.7 g/dL (ref 30.0–36.0)
MCV: 91.6 fL (ref 78.0–100.0)
PLATELETS: 158 10*3/uL (ref 150–400)
RBC: 2.97 MIL/uL — AB (ref 4.22–5.81)
RDW: 16.6 % — ABNORMAL HIGH (ref 11.5–15.5)
WBC: 5.8 10*3/uL (ref 4.0–10.5)

## 2016-04-26 LAB — APTT: APTT: 74 s — AB (ref 24–36)

## 2016-04-26 LAB — PROTIME-INR
INR: 4.35
Prothrombin Time: 43.1 seconds — ABNORMAL HIGH (ref 11.4–15.2)

## 2016-04-26 MED ORDER — MORPHINE SULFATE (PF) 2 MG/ML IV SOLN
INTRAVENOUS | Status: AC
  Administered 2016-04-26: 2 mg via INTRAVENOUS
  Filled 2016-04-26: qty 1

## 2016-04-26 MED ORDER — ANTICOAGULANT SODIUM CITRATE 4% (200MG/5ML) IV SOLN
5.0000 mL | Freq: Once | Status: DC
  Administered 2016-04-26: 5 mL
  Filled 2016-04-26: qty 250

## 2016-04-26 MED ORDER — DARBEPOETIN ALFA 100 MCG/0.5ML IJ SOSY
PREFILLED_SYRINGE | INTRAMUSCULAR | Status: AC
  Administered 2016-04-26: 100 ug via INTRAVENOUS
  Filled 2016-04-26: qty 0.5

## 2016-04-26 MED ORDER — HYDROMORPHONE HCL 2 MG PO TABS
1.0000 mg | ORAL_TABLET | Freq: Once | ORAL | Status: AC
  Administered 2016-04-27: 1 mg via ORAL
  Filled 2016-04-26: qty 1

## 2016-04-26 NOTE — Progress Notes (Addendum)
  [facility] Progress Note  Subjective  Left 2nd-5th fingers still numb but feel "different" than before ligation of fistula.   Objective Vitals:   04/25/16 2034 04/26/16 0405  BP: 118/76 136/79  Pulse: 71 67  Resp: 18 18  Temp: 97.9 F (36.6 C) 97.7 F (36.5 C)    Intake/Output Summary (Last 24 hours) at 04/26/16 0733 Last data filed at 04/26/16 0657  Gross per 24 hour  Intake           192.62 ml  Output              100 ml  Net            92.62 ml    Left upper arm wound VAC seal intact. Small pocket of blood underneath clear dressing.   Assessment/Planning: 46 y.o. male is s/p: Ligation of left BVT and placement of wound VAC 6 Days Post-Op   Patient unable to tolerate VAC dressing change yesterday without IV pain meds. Will see if he can tolerate tomorrow's dressing change with po dilaudid, otherwise will need alternative wound care.  Continue coumadin.   [name] 04/26/2016 7:33 AM --  Laboratory CBC    Component Value Date/Time   WBC 5.6 04/25/2016 0243   HGB 9.3 (L) 04/25/2016 0243   HCT 29.3 (L) 04/25/2016 0243   PLT 173 04/25/2016 0243    BMET    Component Value Date/Time   NA 142 04/24/2016 1110   K 3.9 04/24/2016 1110   CL 101 04/24/2016 1110   CO2 23 04/24/2016 1110   GLUCOSE 92 04/24/2016 1110   BUN 9 04/24/2016 1110   CREATININE 5.16 (H) 04/24/2016 1110   CALCIUM 8.3 (L) 04/24/2016 1110   GFRNONAA 12 (L) 04/24/2016 1110   GFRAA 14 (L) 04/24/2016 1110    COAG Lab Results  Component Value Date   INR 4.35 (HH) 04/26/2016   INR 3.98 04/25/2016   INR 5.32 (HH) 04/25/2016   No results found for: PTT  Antibiotics Anti-infectives    Start     Dose/Rate Route Frequency Ordered Stop   04/16/16 1800  ceFAZolin (ANCEF) IVPB 2g/100 mL premix  Status:  Discontinued    Comments:  Send with pt to OR   2 g 200 mL/hr over 30 Minutes Intravenous Every T-Th-Sa (1800) 04/14/16 1425 04/16/16 1314   04/14/16 1430   ceFAZolin (ANCEF) IVPB 1 g/50 mL premix     1 g 100 mL/hr over 30 Minutes Intravenous  Once 04/14/16 1425 04/14/16 1759   04/13/16 0902  cefUROXime (ZINACEF) 1.5 g in dextrose 5 % 50 mL IVPB     1.5 g 100 mL/hr over 30 Minutes Intravenous 30 min pre-op 04/13/16 0902 04/13/16 Axis, PA-C [facility] Office: [phone] Pager: [phone] 04/26/2016 7:33 AM  Addendum  I have independently interviewed and examined the patient, and I agree with the physician assistant's findings.   - cont coumadin (per Pharmacy): may need to hold overnight if INR today is real as Argatroban was d/c yesterday - PO load of pain rx tomorrow AM 6 am - Will chg dsg at 7 am - If adequate pain control will continue VAC and d/c home, otherwise will change to Aquacel Ag and send home - Nutrition consult for severe protein malnutrition   [name], MD, FACS [facility] [place] Office: [phone] Pager: [phone]  04/26/2016, 8:34 AM

## 2016-04-26 NOTE — Consult Note (Addendum)
WOC follow-up: Called by bedside nurse for bleeding to wound underneath Vac dressing.  200cc blood-tinged drainage in the Vac cannister and large amt clotted blood underneath the drape when patient arrived back from dialysis, according to bedside nurse. Called Vascular PA Maureen to the bedside and removed dressing to assess wound appearance.  Small amt bleeding  to the lower edge of the wound, wound bed beefy red.  Vac discontinued and applied Mepitel contact layer and Aquacel, then kerlex and ace wrap.  Pt in large amt pain when dressing was removed and pain meds given after the procedure. Please refer to vascular team for further plan of care. Please re-consult if further assistance is needed.  Thank-you,  Julien Girt MSN, [place], [place], [place], [place]

## 2016-04-26 NOTE — Progress Notes (Signed)
      I was called to the patient's room for bleeding at the wound vac site.  200 cc blood in wound vac canister at bed side since last shift.  Wound vc removed and dressing replaced with Silver Alginate 4 x 4 and kling with loose ace wrap.  Patient given 1 mg PO dilaudid for pain control.    He has an INR 4.35 off Argatroban 04/25/2016  He will be discharged home with Silver Alginate dressing changes every other day He will need Dilaudid 2 mg tablets given in 1 mg doses so they will have to be cut in half he will be given 7 tablets total.  He will need RN [facility] for every other day dressing changes.  Zakia Sainato MAUREEN PA-C

## 2016-04-26 NOTE — Progress Notes (Addendum)
Initial Nutrition Assessment  DOCUMENTATION CODES:   Obesity unspecified  INTERVENTION:    Nepro Shake po BID, each supplement provides 425 kcal and 19 grams protein   Prostat liquid protein po 30 ml BID with meals, each supplement provides 100 kcal, 15 grams protein  NUTRITION DIAGNOSIS:   Increased nutrient needs related to wound healing as evidenced by estimated needs   GOAL:   Patient will meet greater than or equal to 90% of their needs  MONITOR:   PO intake, Supplement acceptance, Labs, Weight trends, Skin, I & O's  REASON FOR ASSESSMENT:   Consult Assessment of nutrition requirement/status  ASSESSMENT:   46 yo Male who presented for follow-up status post left second stage basilic vein transposition with revision of anastomosis. His fistula was originally created on 12/09/2015 by Dr. [name]. The patient did report numbness in all of the fingers of his left hand following the initial procedure. He was last seen in our office on 03/23/2016 for staple removal. He had some areas of sloughed skin adjacent to his distal incision. Home health was contacted for daily dressing changes.  At that time, he reported mild to moderate symptoms in his left hand and forearm.    Pt sleeping upon RD visit; did not wake. S/p ligation of left BVT and placement of wound VAC. PO intake variable at 25-50% per flowsheet records. Nepro Shake and Prostat protein modular orders in place. Unable to complete Nutrition Focused Physical Exam at this time.  Diet Order:  Diet renal with fluid restriction Fluid restriction: 1200 mL Fluid; Room service appropriate? Yes; Fluid consistency: Thin  Skin:  Wound (see comment) (Left upper arm wound VAC )  Last BM:  1/22  Height:   Ht Readings from Last 1 Encounters:  04/13/16 6\' 2"  (1.88 m)    Weight:   Wt Readings from Last 1 Encounters:  04/26/16 238 lb 5.1 oz (108.1 kg)    Ideal Body Weight:  86.3 kg  BMI:  Body mass index is 30.6  kg/m.  Estimated Nutritional Needs:   Kcal:  2100-2300  Protein:  110-120 gm  Fluid:  2.1-2.3 L  EDUCATION NEEDS:   No education needs identified at this time  Arthur Holms, RD, LDN Pager #: [phone] After-Hours Pager #: [phone]

## 2016-04-26 NOTE — Progress Notes (Signed)
[place] for Argatroban Indication: atrial fibrillation  Allergies  Allergen Reactions  . Heparin Other (See Comments)    UNSPECIFIED REACTION :  On Coumadin since 2004 ? ? HIT ? ?   Patient Measurements: Height: 6\' 2"  (188 cm) Weight: 187 lb 3.2 oz (84.9 kg) IBW/kg (Calculated) : 82.2  Vital Signs: Temp: 97.9 F (36.6 C) (01/22 2034) Temp Source: Oral (01/22 2034) BP: 118/76 (01/22 2034) Pulse Rate: 71 (01/22 2034)  Labs:  Recent Labs  04/23/16 0707 04/23/16 0708  04/24/16 1110  04/25/16 0243 04/25/16 1348 04/25/16 2244  HGB 8.8*  --   --  9.3*  --  9.3*  --   --   HCT 29.0*  --   --  29.3*  --  29.3*  --   --   PLT 148*  --   --  164  --  173  --   --   APTT  --   --   < > 95*  < > 103* 104* 65*  LABPROT  --   --   < >  --   < > 47.8* 50.3* 39.8*  INR  --   --   < >  --   < > 4.99* 5.32* 3.98  CREATININE  --  6.20*  --  5.16*  --   --   --   --   < > = values in this interval not displayed. Estimated Creatinine Clearance: 21 mL/min (by C-G formula based on SCr of 5.16 mg/dL (H)).  Assessment: 46 yo Male with h/o Afib for anticoagulation.  Argatroban d/c'd at 1530.  APTT and INR drawn tonight to assess off argatroban.  INR 3.98 so no need to restart argatroban  Goal of Therapy:  Goal INR= 2-3 Monitor platelets by anticoagulation protocol: Yes   Plan:  F/U aPTT and INR  Phillis Knack, PharmD, BCPS

## 2016-04-26 NOTE — Progress Notes (Signed)
[place] for Warfarin Indication: atrial fibrillation  Allergies  Allergen Reactions  . Heparin Other (See Comments)    UNSPECIFIED REACTION :  On Coumadin since 2004 ? ? HIT ? ?   Patient Measurements: Height: 6\' 2"  (188 cm) Weight: 242 lb 15.2 oz (110.2 kg) IBW/kg (Calculated) : 82.2  Vital Signs: Temp: 98.2 F (36.8 C) (01/23 0750) Temp Source: Oral (01/23 0750) BP: 120/74 (01/23 1030) Pulse Rate: 78 (01/23 1030)  Labs:  Recent Labs  04/24/16 1110  04/25/16 0243 04/25/16 1348 04/25/16 2244 04/26/16 0352 04/26/16 0803  HGB 9.3*  --  9.3*  --   --   --  8.9*  HCT 29.3*  --  29.3*  --   --   --  27.2*  PLT 164  --  173  --   --   --  158  APTT 95*  < > 103* 104* 65*  --  74*  LABPROT  --   < > 47.8* 50.3* 39.8* 43.1*  --   INR  --   < > 4.99* 5.32* 3.98 4.35*  --   CREATININE 5.16*  --   --   --   --   --  8.31*  < > = values in this interval not displayed. Estimated Creatinine Clearance: 14.8 mL/min (by C-G formula based on SCr of 8.31 mg/dL (H)).  Assessment: 46 yo M on coumadin PTA for hx Afib (CHADsVASc = 2; HTN and vascular disease); on coumadin since 2004, admitted 1/10 for left upper arm I&D. Coumadin was on hold and pharmacy has been dosing argatroban for bridging with possible history of HIT.  Pharmacy consulted to resume argatroban with coumadin post op on 1/17. Argatroban discontinued 1/22 at about 3:30pm -INR= 4.35 and aPTT= 74 -The aPTT appears to indicate there may be a residual effect of argatroban but argatroban was discontinued more than 12 hours ago. The INR change has been variable.   Goal of Therapy:  Goal INR=2-3 Monitor platelets by anticoagulation protocol: Yes   Plan:  -Hold coumadin today -Daily PT/INR -aPTT in am  Hildred Laser, Pharm D 04/26/2016 10:38 AM

## 2016-04-26 NOTE — Progress Notes (Signed)
Subjective: pain from dressing change yest. Doesn't think he has 3 kg on.   Objective Vital signs in last 24 hours: Vitals:   04/26/16 1030 04/26/16 1100 04/26/16 1128 04/26/16 1205  BP: 120/74 119/75 124/77 119/72  Pulse: 78 72 78 86  Resp:   18 18  Temp:   98.1 F (36.7 C) 98.4 F (36.9 C)  TempSrc:   Oral Oral  SpO2:   95% 95%  Weight:   108.1 kg (238 lb 5.1 oz)   Height:       Weight change: 28 kg (61 lb 11.2 oz)  Physical Exam: General: alert AAM chronically  ill appearing NAD OX3 Heart: RRR  No rub, mur , gal Lungs: CTA  non labored breathing  Abdomen:  bs pos.  Soft , NT , ND Extremities: bipedal 2+ edema/ L UA  With wound vac  On Necrotic wound  Dialysis Access: R IJ Permcath     OP Dialysis Orders: TTS South 3h 84min 87 kg= EDW  Bath= 2 k , 2.25ca R IJ cath  Hep none (allergic to heparin, using citrate) IV Fe load in progress   Hec 5 mcg    No Mircera   op labs= HGB  11.2    PTH  903 on 03/31/16  Ca 8.6  phos 5.1   Problem/Plan: 1. Calciphylaxis L upper arm - holding all vit D/ Ca products, low Ca bath 2.0 w HD, Na thio tiw w HD. DC"d coumadin, changed to Eliquis. Pt refuses extra HD. Hope for the best that she will heal up with these measures 2. Sp Ligation of AVF / wound vac / was seen by Plastic surg also 3. ESRD HD TTS. HD today.  4. Anemia - HGB 8.1> 8.7  aranesp 100 mcg  q tues hd now / FE  Weekly on HD  5. Secondary hyperparathyroidism - Phos 1.8 1/121/18 holding  Renvela as binder  Last pth 903 03/31/16 on sensipar 120 q supper now 90 mg ,  On 04/24/16 = ca 8.3  Corec ca 9.5 today / Calciphylaxis as above  6. HTN/volume - under dry wt, lowered UF goal to 1-2 kg today. 7. Hx aortic dissection / repair '04 8. Paralysis - complications of Aortic Dissection , is WC dependent 9. Chronic Pain: Per primary. Patient says he attended pain clinic prior to moving to [facility]. Consider referring to pain clinic when [facility] home. 10.  Nutrition: Albumin 2.3. Severe PCM. Renal  diet, add prostat, renal vit. / Wanita Chamberlain MD [facility] pager [phone]   04/26/2016, 4:06 PM    Labs: Basic Metabolic Panel:  Recent Labs Lab 04/23/16 0708 04/24/16 1110 04/26/16 0803  NA 151* 142 143  K 3.3* 3.9 4.1  CL 99* 101 103  CO2 25 23 22   GLUCOSE 92 92 89  BUN 11 9 21*  CREATININE 6.20* 5.16* 8.31*  CALCIUM 6.2* 8.3* 7.8*  PHOS 2.6 1.8* 3.5   Liver Function Tests:  Recent Labs Lab 04/24/16 1110 04/26/16 0352 04/26/16 0803  AST  --  15  --   ALT  --  7*  --   ALKPHOS  --  134*  --   BILITOT  --  0.5  --   PROT  --  5.8*  --   ALBUMIN 2.5* 2.7* 2.5*   CBC:  Recent Labs Lab 04/20/16 0523 04/23/16 0707 04/24/16 1110 04/25/16 0243 04/26/16 0803  WBC 5.4 5.2 6.3 5.6 5.8  HGB 8.7* 8.8*  9.3* 9.3* 8.9*  HCT 28.4* 29.0* 29.3* 29.3* 27.2*  MCV 97.3 96.3 92.4 91.6 91.6  PLT 147* 148* 164 173 158   Cardiac Enzymes: No results for input(s): CKTOTAL, CKMB, CKMBINDEX, TROPONINI in the last 168 hours. CBG:  Recent Labs Lab 04/20/16 1151  GLUCAP 87    Studies/Results: No results found. Medications: . sodium chloride 10 mL/hr at 04/20/16 0952   . cinacalcet  90 mg Oral Q supper  . darbepoetin (ARANESP) injection - DIALYSIS  100 mcg Intravenous Q Tue-HD  . feeding supplement (NEPRO CARB STEADY)  237 mL Oral TID BM  . feeding supplement (PRO-STAT SUGAR FREE 64)  30 mL Oral BID  . gabapentin  100 mg Oral TID  . [START ON 04/27/2016] HYDROmorphone  1 mg Oral Once  . multivitamin  1 tablet Oral QHS  . pantoprazole  40 mg Oral Daily  . sodium chloride flush  3 mL Intravenous Q12H  . sodium thiosulfate infusion for calciphylaxis  25 g Intravenous Q T,Th,Sa-HD  . Warfarin - Pharmacist Dosing Inpatient   Does not apply [phone]

## 2016-04-26 NOTE — Progress Notes (Addendum)
Patient has BM(constipated,hard stool) with fresh blood approx. 5 ml,INR is 3.98.Dr Donzetta Matters made aware.

## 2016-04-26 NOTE — Progress Notes (Signed)
Patient lying in bed, no bleeding at site. Will continue to monitor, call light within reach

## 2016-04-27 ENCOUNTER — Encounter (HOSPITAL_COMMUNITY): Payer: Self-pay | Admitting: Emergency Medicine

## 2016-04-27 ENCOUNTER — Inpatient Hospital Stay (HOSPITAL_COMMUNITY)
Admission: EM | Admit: 2016-04-27 | Discharge: 2016-05-01 | DRG: 919 | Disposition: A | Discharge status: Home / Self Care | Payer: Medicare Other | Attending: Vascular Surgery | Admitting: Vascular Surgery

## 2016-04-27 DIAGNOSIS — Z6823 Body mass index (BMI) 23.0-23.9, adult: Secondary | ICD-10-CM

## 2016-04-27 DIAGNOSIS — Z8679 Personal history of other diseases of the circulatory system: Secondary | ICD-10-CM

## 2016-04-27 DIAGNOSIS — Z79891 Long term (current) use of opiate analgesic: Secondary | ICD-10-CM

## 2016-04-27 DIAGNOSIS — I9762 Postprocedural hemorrhage of a circulatory system organ or structure following other procedure: Secondary | ICD-10-CM | POA: Diagnosis not present

## 2016-04-27 DIAGNOSIS — I120 Hypertensive chronic kidney disease with stage 5 chronic kidney disease or end stage renal disease: Secondary | ICD-10-CM | POA: Diagnosis present

## 2016-04-27 DIAGNOSIS — I4891 Unspecified atrial fibrillation: Secondary | ICD-10-CM | POA: Diagnosis present

## 2016-04-27 DIAGNOSIS — S41102S Unspecified open wound of left upper arm, sequela: Secondary | ICD-10-CM

## 2016-04-27 DIAGNOSIS — Y813 Surgical instruments, materials and general- and plastic-surgery devices (including sutures) associated with adverse incidents: Secondary | ICD-10-CM | POA: Diagnosis present

## 2016-04-27 DIAGNOSIS — Z888 Allergy status to other drugs, medicaments and biological substances status: Secondary | ICD-10-CM

## 2016-04-27 DIAGNOSIS — Y832 Surgical operation with anastomosis, bypass or graft as the cause of abnormal reaction of the patient, or of later complication, without mention of misadventure at the time of the procedure: Secondary | ICD-10-CM | POA: Diagnosis present

## 2016-04-27 DIAGNOSIS — E43 Unspecified severe protein-calorie malnutrition: Secondary | ICD-10-CM | POA: Diagnosis present

## 2016-04-27 DIAGNOSIS — N186 End stage renal disease: Secondary | ICD-10-CM | POA: Diagnosis present

## 2016-04-27 DIAGNOSIS — Z8249 Family history of ischemic heart disease and other diseases of the circulatory system: Secondary | ICD-10-CM

## 2016-04-27 DIAGNOSIS — H3553 Other dystrophies primarily involving the sensory retina: Secondary | ICD-10-CM | POA: Diagnosis present

## 2016-04-27 DIAGNOSIS — D631 Anemia in chronic kidney disease: Secondary | ICD-10-CM | POA: Diagnosis present

## 2016-04-27 DIAGNOSIS — N2581 Secondary hyperparathyroidism of renal origin: Secondary | ICD-10-CM | POA: Diagnosis present

## 2016-04-27 DIAGNOSIS — T148XXA Other injury of unspecified body region, initial encounter: Secondary | ICD-10-CM

## 2016-04-27 DIAGNOSIS — Z992 Dependence on renal dialysis: Secondary | ICD-10-CM

## 2016-04-27 DIAGNOSIS — G8220 Paraplegia, unspecified: Secondary | ICD-10-CM | POA: Diagnosis present

## 2016-04-27 DIAGNOSIS — S51802A Unspecified open wound of left forearm, initial encounter: Secondary | ICD-10-CM | POA: Diagnosis present

## 2016-04-27 DIAGNOSIS — Z79899 Other long term (current) drug therapy: Secondary | ICD-10-CM

## 2016-04-27 DIAGNOSIS — Z7901 Long term (current) use of anticoagulants: Secondary | ICD-10-CM

## 2016-04-27 DIAGNOSIS — L7622 Postprocedural hemorrhage and hematoma of skin and subcutaneous tissue following other procedure: Secondary | ICD-10-CM | POA: Diagnosis not present

## 2016-04-27 DIAGNOSIS — G629 Polyneuropathy, unspecified: Secondary | ICD-10-CM | POA: Diagnosis present

## 2016-04-27 DIAGNOSIS — M1990 Unspecified osteoarthritis, unspecified site: Secondary | ICD-10-CM | POA: Diagnosis present

## 2016-04-27 LAB — PROTIME-INR
INR: 3.99
PROTHROMBIN TIME: 39.9 s — AB (ref 11.4–15.2)

## 2016-04-27 LAB — APTT: aPTT: 77 seconds — ABNORMAL HIGH (ref 24–36)

## 2016-04-27 MED ORDER — OXYCODONE-ACETAMINOPHEN 5-325 MG PO TABS: 1.0000 | ORAL_TABLET | Freq: Three times a day (TID) | ORAL | 0 refills | Status: DC | PRN

## 2016-04-27 MED ORDER — HYDROMORPHONE HCL 2 MG/ML IJ SOLN
1.0000 mg | Freq: Once | INTRAMUSCULAR | Status: AC
  Administered 2016-04-27: 1 mg via INTRAMUSCULAR
  Filled 2016-04-27: qty 1

## 2016-04-27 MED ORDER — TRANEXAMIC ACID 1000 MG/10ML IV SOLN
500.0000 mg | Freq: Once | INTRAVENOUS | Status: AC
  Administered 2016-04-28: 500 mg via TOPICAL
  Filled 2016-04-27: qty 10

## 2016-04-27 MED ORDER — WARFARIN SODIUM 5 MG PO TABS: 7.5000 mg | ORAL_TABLET | Freq: Every day | ORAL | 0 refills | Status: DC

## 2016-04-27 MED ORDER — HYDROMORPHONE HCL 2 MG PO TABS: 1.0000 mg | ORAL_TABLET | ORAL | 0 refills | Status: DC | PRN

## 2016-04-27 MED ORDER — MEGESTROL ACETATE 400 MG/10ML PO SUSP: 400.0000 mg | Freq: Every day | ORAL | 0 refills | Status: DC

## 2016-04-27 MED ORDER — SILVER NITRATE-POT NITRATE 75-25 % EX MISC
1.0000 "application " | CUTANEOUS | Status: DC | PRN
  Filled 2016-04-27 (×2): qty 1

## 2016-04-27 MED ORDER — GABAPENTIN 100 MG PO CAPS
100.0000 mg | ORAL_CAPSULE | Freq: Three times a day (TID) | ORAL | 0 refills | Status: DC
Start: 2016-04-27 — End: 2017-01-26

## 2016-04-27 MED ORDER — NEPRO/CARBSTEADY PO LIQD: 237.0000 mL | Freq: Three times a day (TID) | ORAL | 11 refills | Status: DC

## 2016-04-27 NOTE — Consult Note (Addendum)
WOC follow-up: Requested to re-apply dressing after Dr [name] was in earlier to assess the wound appearance. Small amt bleeding  to the lower edge of the wound, wound bed beefy red, applied Mepitel contact layer and Aquacel, then kerlex and ace wrap.  Vascular team PA at the bedside during dressing change to assess site with bloody drainage and discuss plan of care after discharge with the patient. Pt in large amt pain with dressing change and pain meds had been given earlier. Please refer to vascular team for further plan of care. Please re-consult if further assistance is needed. Thank-you,  Julien Girt MSN, [place], [place], [place], [place]

## 2016-04-27 NOTE — Care Management Note (Addendum)
Case Management Note Marvetta Gibbons RN, BSN Unit 2W-Case Manager [phone]  Patient Details  Name: Corey [name] MRN: [id number] Date of Birth: 1970/07/20  Subjective/Objective:   Pt admitted with Nonhealing surgical wound- s/p I&D with wound VAC placement                 Action/Plan: PTA pt lived at home with brother- orders have been placed for [facility] and home wound VAC- have received call from [place] with [facility]- pre-op referral has been made to them for [facility] services- have placed [facility] wound VAC form on shadow chart for [facility]- will fax to [place] with [facility] once signed. CM to follow for continued d/c arrangements. Spoke with pt at bedside who is agreeable to above arrangements- choice was offered for [facility] services- pt states he is ok using [facility].   Expected Discharge Date:  04/27/16               Expected Discharge Plan:  [place]  In-House Referral:     Discharge planning Services  CM Consult  Post Acute Care Choice:  Durable Medical Equipment, Home Health Choice offered to:  Patient  DME Arranged:  Vac DME Agency:  [facility]  HH Arranged:  RN [place] Agency:  [place] ([facility])  Status of Service:  Completed, signed off  If discussed at [place] of Stay Meetings, dates discussed:  1/18, 1/23  Discharge Disposition: home with home health   Additional Comments:  04/27/16- 1000- Jihan Rudy RN, CM- pt for d/c home today- notified Tiffany with [facility] regarding Home drsg change needs and new [place] orders - pt will d/c without wound VAC- have also notified [facility] to cancel home wound VAC order (spoke with [place]).   04/26/16- 1530- Marvetta Gibbons RN, CM- notified by vascular PA- M. Collins- that pt will not need home wound VAC- VAC has been removed- call made to [place] with [facility] to alert of new home health wound care needs- will have [place] orders placed prior to discharge.    04/22/16- 1400- Marvetta Gibbons RN, CM- faxed signed  [facility] form to [place] with [facility]- for home wound VAC approval   04/21/16- 1600- Dorthy Magnussen RN, CM- pt will need to have INR greater than 4 for discharge- have spoken to Tiffany at [facility] who is following for [facility] needs- order has been placed for [facility] for wound VAC needs- spoke with PA-M. Collins- regarding wound VAC form needs to be signed to start approval process. Will fax to [facility] for home wound VAC once signed by PA or MD.   Dawayne Patricia, RN 04/27/2016, 10:05 AM

## 2016-04-27 NOTE — ED Provider Notes (Signed)
By signing my name below, I, Dolores Hoose, attest that this documentation has been prepared under the direction and in the presence of [place], DO . Electronically Signed: Dolores Hoose, Scribe. 04/27/2016. 11:12 PM.  TIME SEEN: 11:22PM  CHIEF COMPLAINT: Wound Check  HPI:  Corey [name] is a 46 y.o. male with pmhx of a fib on coumadin, ESRD on HD, HTN who presents to the Emergency Department with constant, unchanged bleeding from his left arm. 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 Patient had a wound vac placed 04/18/16 which was removed 04/27/16 prior to discharge from the hospital today as it was leaking.  Patient returned to the OR on 04/20/16 for ligation of the left basilic vein by Dr. [name].  Pt reports associated pain to the area that has been present throughout admission, as well as numbness and paraesthesia to his left hand that has been present since initial fistula placed by Dr. [name] on 12/09/15. No significant change in his pain and no worsening of his numbness. He has tried his normal prescribed pain medication with minimal relief. Pt is currently not taking any coumadin or other blood-thinning medications.  States he is here because the wound would not stop bleeding despite direct pressure. Was previously on Coumadin which he has not been taking for the past few days. Was previously on Argotroban while in the hospital.  INR was 3.99 on discharge.  It appears yesterday morning patient had a new bandage placed with Mepitel, aquacel and then Kerlix and an Ace wrap. Wound was bleeding at that time and vascular PA was aware.  Patient states he has changed the dressing once by himself at home. It was also changed once in the emergency department prior to me seeing the patient. All of these bandages have been soaked with blood. He states that it has been using blood but no arterial bleeding.  No other drainage. No fever.  ROS: See HPI Constitutional: no fever  Eyes: no drainage  ENT: no runny nose   Cardiovascular:  no chest pain  Resp: no SOB  GI: no vomiting GU: no dysuria Integumentary: wound. no rash  Allergy: no hives  Musculoskeletal: no leg swelling  Neurological: numbness. paraesthesia. no slurred speech ROS otherwise negative  PAST MEDICAL HISTORY/PAST SURGICAL HISTORY:  Past Medical History:  Diagnosis Date  . A-fib ([place])   . Arthritis    hands and shoulders  . Blindness and low vision   . Dissection of aorta ([place]) 2004  . Dysrhythmia    A-fib  . ESRD (end stage renal disease) ([place])   . Headache   . History of cardioversion 2014  . Hypertension   . Neuropathy ([place])   . Non-healing non-surgical wound 03/2016  . Paralysis ([place])    due to dissection of aorta in 2004, lower extremities  . Pneumonia     MEDICATIONS:  Prior to Admission medications   Medication Sig Start Date End Date Taking? Authorizing Provider  cinacalcet (SENSIPAR) 60 MG tablet Take 120 mg by mouth daily.     Historical Provider, MD  gabapentin (NEURONTIN) 100 MG capsule Take 1 capsule (100 mg total) by mouth 3 (three) times daily. 04/27/16   [name], PA-C  HYDROmorphone (DILAUDID) 2 MG tablet Take 0.5 tablets (1 mg total) by mouth as needed (prior to dressing changes on L arm every Monday, Wednesday, Friday). 04/27/16  [name], PA-C  labetalol (NORMODYNE) 300 MG tablet Take 300 mg by mouth 2 (two) times daily.    Historical Provider, MD  megestrol (MEGACE) 400 MG/10ML suspension Take 10 mLs (400 mg total) by mouth daily. 04/27/16   [name], PA-C  Nutritional Supplements (FEEDING SUPPLEMENT, NEPRO CARB STEADY,) LIQD Take 237 mLs by mouth 3 (three) times daily between meals. 04/27/16   [name], PA-C  oxyCODONE-acetaminophen (PERCOCET/ROXICET) 5-325 MG tablet Take 1 tablet by mouth every 8 (eight) hours as needed for severe pain. 04/27/16   [name], PA-C  sevelamer carbonate (RENVELA) 800 MG tablet Take 800 mg by mouth 3 (three) times daily with meals.    Historical Provider, MD  warfarin (COUMADIN) 5 MG tablet Take 1.5 tablets (7.5 mg total) by mouth daily. 04/29/16   [name], PA-C    ALLERGIES:  Allergies  Allergen Reactions  . Heparin Other (See Comments)    UNSPECIFIED REACTION :  On Coumadin since 2004 ? ? HIT ? ?    SOCIAL HISTORY:  Social History  Substance Use Topics  . Smoking status: Never Smoker  . Smokeless tobacco: Never Used  . Alcohol use No    FAMILY HISTORY: Family History  Problem Relation Age of Onset  . Cancer Mother   . Hypertension Mother   . Cancer Father   . Hypertension Father   . Thyroid disease Sister   . Stroke Brother     11    EXAM: BP 136/92   Pulse 76   Temp 97.6 F (36.4 C) (Oral)   Resp 18   Ht 6\' 2"  (1.88 m)   Wt 187 lb (84.8 kg)   SpO2 96%   BMI 24.01 kg/m  CONSTITUTIONAL: Alert and oriented and responds appropriately to questions. Well-appearing; well-nourished HEAD: Normocephalic EYES: Conjunctivae clear, PERRL, EOMI ENT: normal nose; no rhinorrhea; moist mucous membranes NECK: Supple, no meningismus, no nuchal rigidity, no LAD  CARD: RRR; S1 and S2 appreciated; no murmurs, no clicks, no rubs, no gallops RESP: Normal chest excursion without splinting or tachypnea; breath sounds clear and equal bilaterally; no wheezes, no rhonchi, no rales, no hypoxia or respiratory distress, speaking full sentences ABD/GI: Normal bowel sounds; non-distended; soft, non-tender, no rebound, no guarding, no peritoneal signs, no hepatosplenomegaly BACK:  The back appears normal and is non-tender to palpation, there is no CVA tenderness EXT: Tender over left upper arm over area of granulation tissue/opwn wound; compartments soft; 2+ radial pulse on left side; Normal ROM in all joints; no edema; normal capillary refill; no cyanosis, no calf tenderness or swelling; no bony  tenderness or bony deformity SKIN: Normal color for age and race; warm; no rash, 10 x 8 cme beefy red area to the inner left upper arm with active oozing of blood; no arterial bleed; no purulent drainage; normal appearing granulation tissue.  No other lesions.   NEURO: Moves all extremities equally, sensation to light touch intact diffusely, cranial nerves II through XII intact, normal speech PSYCH: The patient's mood and manner are appropriate. Grooming and personal hygiene are appropriate.  MEDICAL DECISION MAKING: Patient here with continual bleeding from left upper extremity wound. Recently discharged from the hospital from vascular surgery service.  No signs of superimposed infection. Strong distal pulse. Recently had basilic vein ligated. Receiving dialysis through right IJ catheter. We will check labs today. We will apply gauze soaked with TXA and apply a pressure dressing. There is no vessel that I can  cauterize as this is a slow ooze over the entire wound and likely because his INR is elevated. He does have history of atrial fibrillation but is currently in a sinus rhythm.  Discussed with him risks and benefits of reversing his INR if it is still elevated.  ED PROGRESS: Patient's labs are reassuring. No thrombocytopenia. Hemoglobin is stable. He is hemodynamically stable. INR is still elevated at 3. Pressure dressing with TXA has been on patient for over an hour and he has still bled through this dressing. We'll consult vascular surgery for further recommendations.  2:05 AM  D/w Dr. Oneida Alar with vascular who will see in ED.  No immediate recommendation to reverse coagulopathy.  3:00 AM  Dr. Oneida Alar has seen patient in the emergency department. Plan is to admit. We appreciate his help.   EKG Interpretation  Date/Time:  Thursday April 28 2016 02:21:52 EST Ventricular Rate:  77 PR Interval:    QRS Duration: 96 QT Interval:  413 QTC Calculation: 468 R Axis:   72 Text Interpretation:  Sinus  rhythm Prolonged PR interval Probable left atrial enlargement Probable anteroseptal infarct, old Confirmed by Raeden Belzer,  DO, Treina Arscott ([id number]) on 04/28/2016 3:41:00 AM        I personally performed the services described in this documentation, which was scribed in my presence. The recorded information has been reviewed and is accurate.     [place], DO 04/28/16 [phone]

## 2016-04-27 NOTE — Progress Notes (Signed)
Order received to discharge patient.  Telemetry monitor removed and CCMD notified.  PIV access removed.  Discharge instructions, follow up, medications and instructions for their use were discussed with patient. 

## 2016-04-27 NOTE — Progress Notes (Addendum)
[facility] Progress Note  Subjective    Left arm is sore. Left fingers still feel "funny."  Objective Vitals:   04/26/16 2017 04/27/16 0423  BP: 126/78 118/87  Pulse: 82 75  Resp: 18 16  Temp: 98.3 F (36.8 C) 97.2 F (36.2 C)    Intake/Output Summary (Last 24 hours) at 04/27/16 0746 Last data filed at 04/26/16 1630  Gross per 24 hour  Intake              360 ml  Output             1451 ml  Net            -1091 ml   Left upper arm wound is clean with healthy granulation tissue. Small area of muscle bleeding towards medial aspect of wound.  2+ left radial pulse   Assessment/Planning: 46 y.o. male is s/p: Ligation of left BVT and placement of wound VAC 7 Days Post-Op   VAC discontinued due to leaking Silver alginate dressing to be replaced today. WOC consulted. D/c home today with [facility] for MWF dressing changes. INR 3.99 today. Argatroban was discontinued on 1/22. Will ask pharmacy about coumadin dose for d/c. Will arrange INR f/u for later this week.  D/c home today. Has f/u with plastic surgery in 2 weeks to evaluate wound.   [name] 04/27/2016 7:46 AM --  Laboratory CBC    Component Value Date/Time   WBC 5.8 04/26/2016 0803   HGB 8.9 (L) 04/26/2016 0803   HCT 27.2 (L) 04/26/2016 0803   PLT 158 04/26/2016 0803    BMET    Component Value Date/Time   NA 143 04/26/2016 0803   K 4.1 04/26/2016 0803   CL 103 04/26/2016 0803   CO2 22 04/26/2016 0803   GLUCOSE 89 04/26/2016 0803   BUN 21 (H) 04/26/2016 0803   CREATININE 8.31 (H) 04/26/2016 0803   CALCIUM 7.8 (L) 04/26/2016 0803   GFRNONAA 7 (L) 04/26/2016 0803   GFRAA 8 (L) 04/26/2016 0803    COAG Lab Results  Component Value Date   INR 3.99 04/27/2016   INR 4.35 (HH) 04/26/2016   INR 3.98 04/25/2016   No results found for: PTT  Antibiotics Anti-infectives    Start     Dose/Rate Route Frequency Ordered Stop   04/16/16 1800  ceFAZolin (ANCEF) IVPB 2g/100 mL premix   Status:  Discontinued    Comments:  Send with pt to OR   2 g 200 mL/hr over 30 Minutes Intravenous Every T-Th-Sa (1800) 04/14/16 1425 04/16/16 1314   04/14/16 1430  ceFAZolin (ANCEF) IVPB 1 g/50 mL premix     1 g 100 mL/hr over 30 Minutes Intravenous  Once 04/14/16 1425 04/14/16 1759   04/13/16 0902  cefUROXime (ZINACEF) 1.5 g in dextrose 5 % 50 mL IVPB     1.5 g 100 mL/hr over 30 Minutes Intravenous 30 min pre-op 04/13/16 0902 04/13/16 [place], PA-C [facility] Office: [phone] Pager: [phone] 04/27/2016 7:46 AM  Addendum  I have independently interviewed and examined the patient, and I agree with the physician assistant's findings.  Pt tolerated dressing changes with PO rx.  Wound appears to be granulation.  Recent fistula exposure is healing.    - Ok to d/c home with wound care and weekly follow-up.   - Pt has follow up with Plastic on Feb 7 and follow with me on Feb 9. -  F/U with Coumadin clinic this week  [name], MD, FACS [facility] [place] Office: [phone] Pager: [phone]  04/27/2016, 8:00 AM

## 2016-04-27 NOTE — ED Triage Notes (Signed)
Per EMS, patient discharged from hospital today from infection of dialysis shunt and surgical wound infection.  New dialysis site in December and has not been able to use it yet.  Constant pain.  137/97, 86 HR, RR 16. Pain 7 out of 10.

## 2016-04-27 NOTE — Progress Notes (Signed)
[place] for Warfarin Indication: atrial fibrillation  Allergies  Allergen Reactions  . Heparin Other (See Comments)    UNSPECIFIED REACTION :  On Coumadin since 2004 ? ? HIT ? ?   Patient Measurements: Height: 6\' 2"  (188 cm) Weight: 238 lb 15.7 oz (108.4 kg) IBW/kg (Calculated) : 82.2  Vital Signs: Temp: 97.2 F (36.2 C) (01/24 0423) Temp Source: Oral (01/24 0423) BP: 118/87 (01/24 0423) Pulse Rate: 75 (01/24 0423)  Labs:  Recent Labs  04/24/16 1110  04/25/16 0243  04/25/16 2244 04/26/16 0352 04/26/16 0803 04/27/16 0653  HGB 9.3*  --  9.3*  --   --   --  8.9*  --   HCT 29.3*  --  29.3*  --   --   --  27.2*  --   PLT 164  --  173  --   --   --  158  --   APTT 95*  < > 103*  < > 65*  --  74* 77*  LABPROT  --   < > 47.8*  < > 39.8* 43.1*  --  39.9*  INR  --   < > 4.99*  < > 3.98 4.35*  --  3.99  CREATININE 5.16*  --   --   --   --   --  8.31*  --   < > = values in this interval not displayed. Estimated Creatinine Clearance: 14.7 mL/min (by C-G formula based on SCr of 8.31 mg/dL (H)).  Assessment: 46 yo M on coumadin PTA for hx Afib (CHADsVASc = 2; HTN and vascular disease); on coumadin since 2004, admitted 1/10 for left upper arm I&D. Coumadin was on hold and pharmacy has been dosing argatroban for bridging with possible history of HIT.  Pharmacy consulted to resume argatroban with coumadin post op on 1/17. Argatroban discontinued 1/22 at about 3:30pm -INR= 4.35 and aPTT=77  -aPTT was 71 on admission (no argatroban influence) with INR = 2.32.  He may have some kind of thrombophilia as this degree of aPTT elevation is not expected with coumadin  Goal of Therapy:  Goal INR=2-3 Monitor platelets by anticoagulation protocol: Yes   Plan:  -Hold coumadin today -At discharge, consider holding coumadin and check INR this friday -Daily PT/INR  Hildred Laser, Pharm D 04/27/2016 8:08 AM

## 2016-04-27 NOTE — Discharge Instructions (Signed)
PLEASE DO NOT TAKE COUMADIN TONIGHT OR TOMORROW NIGHT. YOU WILL FOLLOW UP AT DR [name] OFFICE ON FRIDAY 04/29/16.

## 2016-04-28 ENCOUNTER — Encounter (HOSPITAL_COMMUNITY): Payer: Self-pay | Admitting: General Practice

## 2016-04-28 DIAGNOSIS — I9762 Postprocedural hemorrhage of a circulatory system organ or structure following other procedure: Secondary | ICD-10-CM | POA: Diagnosis present

## 2016-04-28 DIAGNOSIS — Z8679 Personal history of other diseases of the circulatory system: Secondary | ICD-10-CM | POA: Diagnosis not present

## 2016-04-28 DIAGNOSIS — Z888 Allergy status to other drugs, medicaments and biological substances status: Secondary | ICD-10-CM | POA: Diagnosis not present

## 2016-04-28 DIAGNOSIS — Y832 Surgical operation with anastomosis, bypass or graft as the cause of abnormal reaction of the patient, or of later complication, without mention of misadventure at the time of the procedure: Secondary | ICD-10-CM | POA: Diagnosis present

## 2016-04-28 DIAGNOSIS — M1990 Unspecified osteoarthritis, unspecified site: Secondary | ICD-10-CM | POA: Diagnosis present

## 2016-04-28 DIAGNOSIS — G8220 Paraplegia, unspecified: Secondary | ICD-10-CM | POA: Diagnosis present

## 2016-04-28 DIAGNOSIS — G629 Polyneuropathy, unspecified: Secondary | ICD-10-CM | POA: Diagnosis present

## 2016-04-28 DIAGNOSIS — Z992 Dependence on renal dialysis: Secondary | ICD-10-CM | POA: Diagnosis not present

## 2016-04-28 DIAGNOSIS — I4891 Unspecified atrial fibrillation: Secondary | ICD-10-CM | POA: Diagnosis present

## 2016-04-28 DIAGNOSIS — Y813 Surgical instruments, materials and general- and plastic-surgery devices (including sutures) associated with adverse incidents: Secondary | ICD-10-CM | POA: Diagnosis present

## 2016-04-28 DIAGNOSIS — H3553 Other dystrophies primarily involving the sensory retina: Secondary | ICD-10-CM | POA: Diagnosis present

## 2016-04-28 DIAGNOSIS — I120 Hypertensive chronic kidney disease with stage 5 chronic kidney disease or end stage renal disease: Secondary | ICD-10-CM | POA: Diagnosis present

## 2016-04-28 DIAGNOSIS — D631 Anemia in chronic kidney disease: Secondary | ICD-10-CM | POA: Diagnosis present

## 2016-04-28 DIAGNOSIS — Z79891 Long term (current) use of opiate analgesic: Secondary | ICD-10-CM | POA: Diagnosis not present

## 2016-04-28 DIAGNOSIS — Z79899 Other long term (current) drug therapy: Secondary | ICD-10-CM | POA: Diagnosis not present

## 2016-04-28 DIAGNOSIS — S41102S Unspecified open wound of left upper arm, sequela: Secondary | ICD-10-CM

## 2016-04-28 DIAGNOSIS — Z7901 Long term (current) use of anticoagulants: Secondary | ICD-10-CM | POA: Diagnosis not present

## 2016-04-28 DIAGNOSIS — S51802A Unspecified open wound of left forearm, initial encounter: Secondary | ICD-10-CM | POA: Diagnosis present

## 2016-04-28 DIAGNOSIS — Z6823 Body mass index (BMI) 23.0-23.9, adult: Secondary | ICD-10-CM | POA: Diagnosis not present

## 2016-04-28 DIAGNOSIS — Z8249 Family history of ischemic heart disease and other diseases of the circulatory system: Secondary | ICD-10-CM | POA: Diagnosis not present

## 2016-04-28 DIAGNOSIS — N2581 Secondary hyperparathyroidism of renal origin: Secondary | ICD-10-CM | POA: Diagnosis present

## 2016-04-28 DIAGNOSIS — N186 End stage renal disease: Secondary | ICD-10-CM | POA: Diagnosis present

## 2016-04-28 DIAGNOSIS — E43 Unspecified severe protein-calorie malnutrition: Secondary | ICD-10-CM | POA: Diagnosis present

## 2016-04-28 LAB — BASIC METABOLIC PANEL
Anion gap: 24 — ABNORMAL HIGH (ref 5–15)
BUN: 25 mg/dL — ABNORMAL HIGH (ref 6–20)
CO2: 24 mmol/L (ref 22–32)
CREATININE: 7 mg/dL — AB (ref 0.61–1.24)
Calcium: 8.6 mg/dL — ABNORMAL LOW (ref 8.9–10.3)
Chloride: 95 mmol/L — ABNORMAL LOW (ref 101–111)
GFR, EST AFRICAN AMERICAN: 10 mL/min — AB (ref >60)
GFR, EST NON AFRICAN AMERICAN: 8 mL/min — AB (ref >60)
Glucose, Bld: 89 mg/dL (ref 65–99)
Potassium: 4.1 mmol/L (ref 3.5–5.1)
SODIUM: 143 mmol/L (ref 135–145)

## 2016-04-28 LAB — CBC
HCT: 29.3 % — ABNORMAL LOW (ref 39.0–52.0)
Hemoglobin: 9.3 g/dL — ABNORMAL LOW (ref 13.0–17.0)
MCH: 29.2 pg (ref 26.0–34.0)
MCHC: 31.7 g/dL (ref 30.0–36.0)
MCV: 92.1 fL (ref 78.0–100.0)
PLATELETS: 182 10*3/uL (ref 150–400)
RBC: 3.18 MIL/uL — ABNORMAL LOW (ref 4.22–5.81)
RDW: 16.1 % — AB (ref 11.5–15.5)
WBC: 6.1 10*3/uL (ref 4.0–10.5)

## 2016-04-28 LAB — PROTIME-INR
INR: 3.75
PROTHROMBIN TIME: 38 s — AB (ref 11.4–15.2)

## 2016-04-28 LAB — MRSA PCR SCREENING: MRSA by PCR: NEGATIVE

## 2016-04-28 LAB — APTT: aPTT: 84 seconds — ABNORMAL HIGH (ref 24–36)

## 2016-04-28 MED ORDER — SODIUM CHLORIDE 0.9% FLUSH
3.0000 mL | Freq: Two times a day (BID) | INTRAVENOUS | Status: DC
  Administered 2016-04-28 – 2016-04-30 (×5): 3 mL via INTRAVENOUS

## 2016-04-28 MED ORDER — ONDANSETRON HCL 4 MG/2ML IJ SOLN: 4.0000 mg | Freq: Four times a day (QID) | INTRAMUSCULAR | Status: DC | PRN

## 2016-04-28 MED ORDER — CINACALCET HCL 30 MG PO TABS
120.0000 mg | ORAL_TABLET | Freq: Every day | ORAL | Status: DC
  Administered 2016-04-28 – 2016-04-29 (×2): 120 mg via ORAL
  Filled 2016-04-28 (×3): qty 4

## 2016-04-28 MED ORDER — OXYCODONE HCL 5 MG PO TABS
5.0000 mg | ORAL_TABLET | ORAL | Status: DC | PRN
  Administered 2016-04-28 – 2016-04-30 (×5): 10 mg via ORAL
  Filled 2016-04-28 (×5): qty 2

## 2016-04-28 MED ORDER — LABETALOL HCL 5 MG/ML IV SOLN: 10.0000 mg | INTRAVENOUS | Status: DC | PRN

## 2016-04-28 MED ORDER — PANTOPRAZOLE SODIUM 40 MG PO TBEC
40.0000 mg | DELAYED_RELEASE_TABLET | Freq: Every day | ORAL | Status: DC
  Administered 2016-04-28 – 2016-05-01 (×4): 40 mg via ORAL
  Filled 2016-04-28 (×4): qty 1

## 2016-04-28 MED ORDER — POTASSIUM CHLORIDE CRYS ER 20 MEQ PO TBCR: 20.0000 meq | EXTENDED_RELEASE_TABLET | Freq: Once | ORAL | Status: DC

## 2016-04-28 MED ORDER — PHENOL 1.4 % MT LIQD: 1.0000 | OROMUCOSAL | Status: DC | PRN

## 2016-04-28 MED ORDER — MEGESTROL ACETATE 400 MG/10ML PO SUSP
400.0000 mg | Freq: Every day | ORAL | Status: DC
  Administered 2016-04-28 – 2016-05-01 (×4): 400 mg via ORAL
  Filled 2016-04-28 (×5): qty 10

## 2016-04-28 MED ORDER — GUAIFENESIN-DM 100-10 MG/5ML PO SYRP: 15.0000 mL | ORAL_SOLUTION | ORAL | Status: DC | PRN

## 2016-04-28 MED ORDER — ACETAMINOPHEN 325 MG PO TABS: 325.0000 mg | ORAL_TABLET | ORAL | Status: DC | PRN

## 2016-04-28 MED ORDER — GABAPENTIN 100 MG PO CAPS
100.0000 mg | ORAL_CAPSULE | Freq: Three times a day (TID) | ORAL | Status: DC
  Administered 2016-04-28 – 2016-05-01 (×10): 100 mg via ORAL
  Filled 2016-04-28 (×10): qty 1

## 2016-04-28 MED ORDER — LABETALOL HCL 300 MG PO TABS
300.0000 mg | ORAL_TABLET | Freq: Two times a day (BID) | ORAL | Status: DC
  Administered 2016-04-28 (×2): 300 mg via ORAL
  Filled 2016-04-28: qty 2
  Filled 2016-04-28: qty 1

## 2016-04-28 MED ORDER — ACETAMINOPHEN 325 MG RE SUPP
325.0000 mg | RECTAL | Status: DC | PRN
  Filled 2016-04-28: qty 2

## 2016-04-28 MED ORDER — SODIUM CHLORIDE 0.9 % IV SOLN: 250.0000 mL | INTRAVENOUS | Status: DC | PRN

## 2016-04-28 MED ORDER — MORPHINE SULFATE (PF) 4 MG/ML IV SOLN: 2.0000 mg | INTRAVENOUS | Status: DC | PRN

## 2016-04-28 MED ORDER — SEVELAMER CARBONATE 800 MG PO TABS
800.0000 mg | ORAL_TABLET | Freq: Three times a day (TID) | ORAL | Status: DC
  Administered 2016-04-28 – 2016-05-01 (×8): 800 mg via ORAL
  Filled 2016-04-28 (×11): qty 1

## 2016-04-28 MED ORDER — HYDRALAZINE HCL 20 MG/ML IJ SOLN: 5.0000 mg | INTRAMUSCULAR | Status: DC | PRN

## 2016-04-28 MED ORDER — SODIUM CHLORIDE 0.9% FLUSH: 3.0000 mL | INTRAVENOUS | Status: DC | PRN

## 2016-04-28 MED ORDER — METOPROLOL TARTRATE 5 MG/5ML IV SOLN: 2.0000 mg | INTRAVENOUS | Status: DC | PRN

## 2016-04-28 MED ORDER — NEPRO/CARBSTEADY PO LIQD
237.0000 mL | Freq: Three times a day (TID) | ORAL | Status: DC
  Administered 2016-04-28 – 2016-04-30 (×4): 237 mL via ORAL

## 2016-04-28 MED ORDER — HYDROMORPHONE HCL 2 MG PO TABS
1.0000 mg | ORAL_TABLET | ORAL | Status: DC | PRN
  Administered 2016-05-01: 1 mg via ORAL
  Filled 2016-04-28: qty 1

## 2016-04-28 NOTE — H&P (Signed)
Referring Physician: Dr Tracie Harrier ER  Patient name: Corey [name] MRN: [id number] DOB: [birth year] Sex: male  REASON FOR CONSULT: bleeding left arm wound  HPI: Corey [name] is a 46 y.o. male, with left arm wound after removal of AV graft with calciphylaxis. Discharged from hospital yesterday.  Returned to ER early this morning complaining of bleeding.  Pt is on Coumadin for afib. Other medical problems include ESRD hypertension.  Past Medical History:  Diagnosis Date  . A-fib ([place])   . Arthritis    hands and shoulders  . Blindness and low vision   . Dissection of aorta ([place]) 2004  . Dysrhythmia    A-fib  . ESRD (end stage renal disease) ([place])   . Headache   . History of cardioversion 2014  . Hypertension   . Neuropathy ([place])   . Non-healing non-surgical wound 03/2016  . Paralysis ([place])    due to dissection of aorta in 2004, lower extremities  . Pneumonia    Past Surgical History:  Procedure Laterality Date  . APPLICATION OF WOUND VAC Left 04/13/2016   Procedure: APPLICATION OF WOUND VAC;  Surgeon: Conrad Harlan, MD;  Location: [facility];  Service: Vascular;  Laterality: Left;  . APPLICATION OF WOUND VAC Left 04/18/2016   Procedure: APPLICATION OF WOUND VAC;  Surgeon: Waynetta Sandy, MD;  Location: [facility];  Service: Vascular;  Laterality: Left;  Wound vac change   . APPLICATION OF WOUND VAC Left 04/20/2016   Procedure: WOUND VAC CHANGE;  Surgeon: Conrad Del Rey, MD;  Location: [place];  Service: Vascular;  Laterality: Left;  . AV FISTULA PLACEMENT    . BASCILIC VEIN TRANSPOSITION Left 12/09/2015   Procedure: FIRST STAGE BASILIC VEIN TRANSPOSITION LEFT UPPER ARM;  Surgeon: Conrad Richfield, MD;  Location: [facility] [place];  Service: Vascular;  Laterality: Left;  . BASCILIC VEIN TRANSPOSITION Left 03/09/2016   Procedure: SECOND STAGE BASILIC VEIN TRANSPOSITION WITH REVISION OF ANASTOMOSIS LEFT UPPER ARM;  Surgeon: Conrad Honea Path, MD;  Location: [facility];  Service: Vascular;  Laterality: Left;  .  CARDIOVERSION    . NONE    . REPAIR OF ACUTE ASCENDING THORACIC AORTIC DISSECTION    . REVISON OF ARTERIOVENOUS FISTULA Left 04/20/2016   Procedure: LIGATION OF BASILIC VEIN TRANSPOSITION;  Surgeon: Conrad Golinda, MD;  Location: [facility];  Service: Vascular;  Laterality: Left;  . WOUND DEBRIDEMENT Left 04/13/2016   Procedure: DEBRIDEMENT WOUND;  Surgeon: Conrad Uvalde, MD;  Location: [place];  Service: Vascular;  Laterality: Left;    Family History  Problem Relation Age of Onset  . Cancer Mother   . Hypertension Mother   . Cancer Father   . Hypertension Father   . Thyroid disease Sister   . Stroke Brother     11    SOCIAL HISTORY: Social History   Social History  . Marital status: Divorced    Spouse name: N/A  . Number of children: 2  . Years of education: N/A   Occupational History  . DIABLED    Social History Main Topics  . Smoking status: Never Smoker  . Smokeless tobacco: Never Used  . Alcohol use No  . Drug use: No  . Sexual activity: Not on file   Other Topics Concern  . Not on file   Social History Narrative  . No narrative on file    Allergies  Allergen Reactions  . Heparin Other (See Comments)    UNSPECIFIED REACTION :  On Coumadin since 2004 ? ? HIT ? ?    No current facility-administered medications for this encounter.    Current Outpatient Prescriptions  Medication Sig Dispense Refill  . cinacalcet (SENSIPAR) 60 MG tablet Take 120 mg by mouth daily.     [name] gabapentin (NEURONTIN) 100 MG capsule Take 1 capsule (100 mg total) by mouth 3 (three) times daily. 90 capsule 0  . HYDROmorphone (DILAUDID) 2 MG tablet Take 0.5 tablets (1 mg total) by mouth as needed (prior to dressing changes on L arm every Monday, Wednesday, Friday). 7 tablet 0  . labetalol (NORMODYNE) 300 MG tablet Take 300 mg by mouth 2 (two) times daily.    . megestrol (MEGACE) 400 MG/10ML suspension Take 10 mLs (400 mg total) by mouth daily. 240 mL 0  . Nutritional Supplements (FEEDING  SUPPLEMENT, NEPRO CARB STEADY,) LIQD Take 237 mLs by mouth 3 (three) times daily between meals. 21 Can 11  . oxyCODONE-acetaminophen (PERCOCET/ROXICET) 5-325 MG tablet Take 1 tablet by mouth every 8 (eight) hours as needed for severe pain. 15 tablet 0  . sevelamer carbonate (RENVELA) 800 MG tablet Take 800 mg by mouth 3 (three) times daily with meals.    Derrill Memo ON 04/29/2016] warfarin (COUMADIN) 5 MG tablet Take 1.5 tablets (7.5 mg total) by mouth daily. 30 tablet 0    Physical Examination  Vitals:   04/27/16 2231 04/27/16 2236  BP:  136/92  Pulse:  76  Resp:  18  Temp:  97.6 F (36.4 C)  TempSrc:  Oral  SpO2:  96%  Weight: 187 lb (84.8 kg)   Height: 6\' 2"  (1.88 m)     Body mass index is 24.01 kg/m.  General:  Alert and oriented, no acute distress HEENT: Normal Skin: No rash, 10 x 10 cm wound left upper arm with skin edge oozing  DATA:  CBC    Component Value Date/Time   WBC 6.1 04/28/2016 0036   RBC 3.18 (L) 04/28/2016 0036   HGB 9.3 (L) 04/28/2016 0036   HCT 29.3 (L) 04/28/2016 0036   PLT 182 04/28/2016 0036   MCV 92.1 04/28/2016 0036   MCH 29.2 04/28/2016 0036   MCHC 31.7 04/28/2016 0036   RDW 16.1 (H) 04/28/2016 0036   BMET    Component Value Date/Time   NA 143 04/28/2016 0036   K 4.1 04/28/2016 0036   CL 95 (L) 04/28/2016 0036   CO2 24 04/28/2016 0036   GLUCOSE 89 04/28/2016 0036   BUN 25 (H) 04/28/2016 0036   CREATININE 7.00 (H) 04/28/2016 0036   CALCIUM 8.6 (L) 04/28/2016 0036   GFRNONAA 8 (L) 04/28/2016 0036   GFRAA 10 (L) 04/28/2016 0036   INR 3.7 PTT84  ASSESSMENT:  Diffuse skin edge bleeding from open wound made worse by coagulapathy   PLAN:  Admit for continued local wound care.  Hold coumadin for now.  May need to stop temporarily until bleeding resolves Pressure dressing applied    Ruta Hinds, MD [facility] [place]: [phone] Pager: [phone]

## 2016-04-28 NOTE — ED Notes (Signed)
Pt bed now in room on 6E.  Pt transported to floor.

## 2016-04-28 NOTE — Discharge Summary (Signed)
[facility] Discharge Summary  Corey [name] 03/16/1971 46 y.o. male  [id number]  Admission Date: 04/13/2016  Discharge Date: 04/27/2016  Physician: [name], MD  Admission Diagnosis: End Stage Renal Disease N18.6; Wound dehiscence T81.31XA End Stage Renal Disease N18.6; Post-operative infection to left arm T81.4XXD End Stage Renal Diseae N18.6; Left arm steal syndrome T82.510  HPI:   This is a 45 y.o. male who presents for follow-up status post left second stage basilic vein transposition with revision of anastomosis. His fistula was originally created on 12/09/2015 by Dr. [name]. Corey [name] did report numbness in all of Corey fingers of his left hand following Corey initial procedure. He was last seen in our office on 03/23/2016 for staple removal. He had some areas of sloughed skin adjacent to his distal incision. Home health was contacted for daily dressing changes.  At that time, he reported mild to moderate symptoms in his left hand and forearm.   Corey [name] is using a right IJ catheter for dialysis.  Today, he complains of worsening numbness to all of his left fingertips and left palm. He also feels that his left arm is weak. He also says that Corey wounds of his left upper arm have worsened. Home health never came to his home. He denies any drainage or foul odor. He notes that dead skin keeps coming off when he takes Corey dressing off. He denies any fever or chills. He has been putting Neosporin on his wound at home.  He is on Coumadin for atrial fibrillation.  Hospital Course:  Corey [name] was admitted to [facility] hospital and taken to [facility] operating room on 04/13/2016 and underwent irrigation and excision of dead skin of left upper arm and placement of negative pressure dressing.  Corey [name] tolerated Corey procedure well and was transported to [facility] PACU in stable condition.   Post-operatively, he was started on argatroban given history of HIT. His surgical pathology  showed vascular calcifications that was consistent with calciphylaxis. He was initially started on antibiotics for gram positive cocci in Corey wound. Corey culture came back as staph aureus as this was felt to be contaminant and Corey wound did not appear infected. Renal started Corey [name] on sodium thiosulfate for calciphylaxis. Plastic surgery was consulted for wound evaluation.   He was taken back to [facility]y operating room on 04/18/16 for wound vac change and evaluation by plastic surgery. His wound showed formation of granulation tissue at Corey edges without spread of calciphylaxis.   His argatroban was discontinued on 04/25/16 as his INR was therapeutic. Corey remainder of his hospitalization consisted of weaning of IV pain medication and wound care. Corey [name] did have some leakage from his wound VAC on 04/26/16. Corey VAC was removed and he was started on silver alginate dressings. His wound was inspected on 04/27/16. It appeared to be clean with healthy granulation tissue. There was a small amount muscle oozing. This was likely secondary to coumadin. Nursing staff was instructed to apply pressure to this area and use silver nitrate if necessary. If bleeding did not resolve, to call PA. His INR was 3.99 on 04/27/16. HIs coumadin had been Corey held Corey night before. His INR was high likely secondary to residual effect from argatroban. Anticoagulation dose discussed with pharmacy who advised [name] to continue holding coumadin on 1/24 and 1/25 with INR follow-up on 1/26. Corey [name] was discharged home on 04/27/16 in good condition.    CBC    Component Value Date/Time   WBC  6.1 04/28/2016 0036   RBC 3.18 (L) 04/28/2016 0036   HGB 9.3 (L) 04/28/2016 0036   HCT 29.3 (L) 04/28/2016 0036   PLT 182 04/28/2016 0036   MCV 92.1 04/28/2016 0036   MCH 29.2 04/28/2016 0036   MCHC 31.7 04/28/2016 0036   RDW 16.1 (H) 04/28/2016 0036    BMET    Component Value Date/Time   NA 143 04/28/2016 0036   K 4.1 04/28/2016 0036    CL 95 (L) 04/28/2016 0036   CO2 24 04/28/2016 0036   GLUCOSE 89 04/28/2016 0036   BUN 25 (H) 04/28/2016 0036   CREATININE 7.00 (H) 04/28/2016 0036   CALCIUM 8.6 (L) 04/28/2016 0036   GFRNONAA 8 (L) 04/28/2016 0036   GFRAA 10 (L) 04/28/2016 0036     Discharge Instructions:   Corey [name] is discharged to home with extensive instructions on wound care and progressive ambulation.  They are instructed not to drive or perform any heavy lifting until returning to see Corey physician in his office.  Discharge Instructions    Activity as tolerated - No restrictions    Complete by:  As directed    Activity as tolerated - No restrictions    Complete by:  As directed    Call MD for:  redness, tenderness, or signs of infection (pain, swelling, bleeding, redness, odor or green/yellow discharge around incision site)    Complete by:  As directed    Call MD for:  redness, tenderness, or signs of infection (pain, swelling, bleeding, redness, odor or green/yellow discharge around incision site)    Complete by:  As directed    Call MD for:  severe or increased pain, loss or decreased feeling  in affected limb(s)    Complete by:  As directed    Call MD for:  severe or increased pain, loss or decreased feeling  in affected limb(s)    Complete by:  As directed    Call MD for:  temperature >100.5    Complete by:  As directed    Call MD for:  temperature >100.5    Complete by:  As directed    Discharge instructions    Complete by:  As directed    Keep left UE dry at wound vac site   Discharge wound care:    Complete by:  As directed    Dressing changes every Monday, Wednesday, Friday. Take 1 mg of dilaudid (split pill in half) only as needed for dressing changes.   Driving Restrictions    Complete by:  As directed    No driving while on pain medication   Resume previous diet    Complete by:  As directed    Resume previous diet    Complete by:  As directed       Discharge Diagnosis:  End Stage  Renal Disease N18.6; Wound dehiscence T81.31XA End Stage Renal Disease N18.6; Post-operative infection to left arm T81.4XXD End Stage Renal Diseae N18.6; Left arm steal syndrome T82.510  Secondary Diagnosis: [name] Active Problem List   Diagnosis Date Noted  . Arm wound, left, sequela 04/28/2016  . Nonhealing surgical wound 04/13/2016  . Chronic kidney disease (CKD), stage IV (severe) ([place]) 02/19/2016  . Atrial fibrillation ([place]) [I48.91] 12/23/2015   Past Medical History:  Diagnosis Date  . A-fib ([place])   . Arthritis    hands and shoulders  . Blindness and low vision   . Dissection of aorta ([place]) 2004  . Dysrhythmia    A-fib  .  ESRD (end stage renal disease) ([place])   . Headache   . History of cardioversion 2014  . Hypertension   . Neuropathy ([place])   . Non-healing non-surgical wound 03/2016  . Paralysis ([place])    due to dissection of aorta in 2004, lower extremities  . Pneumonia      Allergies as of 04/27/2016      Reactions   Heparin Other (See Comments)   UNSPECIFIED REACTION :  On Coumadin since 2004 ? ? HIT ? ?      Medication List    TAKE these medications   cinacalcet 60 MG tablet Commonly known as:  SENSIPAR Take 120 mg by mouth daily.   feeding supplement (NEPRO CARB STEADY) Liqd Take 237 mLs by mouth 3 (three) times daily between meals.   gabapentin 100 MG capsule Commonly known as:  NEURONTIN Take 1 capsule (100 mg total) by mouth 3 (three) times daily. What changed:  when to take this   HYDROmorphone 2 MG tablet Commonly known as:  DILAUDID Take 0.5 tablets (1 mg total) by mouth as needed (prior to dressing changes on L arm every Monday, Wednesday, Friday).   labetalol 300 MG tablet Commonly known as:  NORMODYNE Take 300 mg by mouth 2 (two) times daily.   megestrol 400 MG/10ML suspension Commonly known as:  MEGACE Take 10 mLs (400 mg total) by mouth daily.   oxyCODONE-acetaminophen 5-325 MG tablet Commonly known as:  PERCOCET/ROXICET Take 1  tablet by mouth every 8 (eight) hours as needed for severe pain. What changed:  when to take this  reasons to take this   sevelamer carbonate 800 MG tablet Commonly known as:  RENVELA Take 800 mg by mouth 3 (three) times daily with meals.   warfarin 5 MG tablet Commonly known as:  COUMADIN Take 1.5 tablets (7.5 mg total) by mouth daily. Start taking on:  04/29/2016       Percocet #15 No Refill, Dilaudid # 7 No Refill  Disposition: Home  [name]'s condition: is Good  Follow up: 1. Dr. [name] in 2 weeks 2. Dr. Ashley Mariner 05/11/16 3. INR check on 04/29/16   Virgina Jock, PA-C [facility] [phone] 04/28/2016  11:03 AM  Addendum  I have independently interviewed and examined Corey [name], and I agree with Corey physician assistant's discharge summary.  This [name] bounced back due to supratherapeutic anticoagulation despite holding Warfarin for several days.  Corey [name] continued to have some ooze from her L upper arm wound.  She was controlled with non-operative means with wound care.  Corey [name] coumadin eventually dropped to therapeutic levels.  He will continue with M-W-F dressing changes and weekly wound checks.  He has follow up Corey second week of Feb with Dr. Leland Johns and myself.  See previous recent discharge summary for additional details.  [name], MD, FACS [facility] [place] Office: [phone] Pager: [phone]  05/02/2016, 7:15 AM

## 2016-04-28 NOTE — Progress Notes (Signed)
Patient arrived on unit from ED.  No family present.  Telemetry placed per MD order and CMT notified.

## 2016-04-28 NOTE — ED Notes (Signed)
Report was called to floor RN.  Currently facilities has been contacted to place a bed in the room for the pt.  They will call when bed is placed so pt can go to floor.

## 2016-04-29 LAB — PROTIME-INR
INR: 3.39
PROTHROMBIN TIME: 35.1 s — AB (ref 11.4–15.2)

## 2016-04-29 MED ORDER — DARBEPOETIN ALFA 100 MCG/0.5ML IJ SOSY
100.0000 ug | PREFILLED_SYRINGE | INTRAMUSCULAR | Status: DC
  Administered 2016-04-30: 100 ug via INTRAVENOUS
  Filled 2016-04-29: qty 0.5

## 2016-04-29 MED ORDER — DARBEPOETIN ALFA 100 MCG/0.5ML IJ SOSY: 100.0000 ug | PREFILLED_SYRINGE | Freq: Once | INTRAMUSCULAR | Status: DC

## 2016-04-29 MED ORDER — CINACALCET HCL 30 MG PO TABS
90.0000 mg | ORAL_TABLET | Freq: Every day | ORAL | Status: DC
  Administered 2016-04-30 – 2016-05-01 (×2): 90 mg via ORAL
  Filled 2016-04-29 (×2): qty 3

## 2016-04-29 MED ORDER — SODIUM THIOSULFATE 25 % IV SOLN
25.0000 g | INTRAVENOUS | Status: DC
  Administered 2016-04-30: 25 g via INTRAVENOUS
  Filled 2016-04-29 (×2): qty 100

## 2016-04-29 MED ORDER — LABETALOL HCL 200 MG PO TABS
200.0000 mg | ORAL_TABLET | Freq: Two times a day (BID) | ORAL | Status: DC
  Administered 2016-04-29 – 2016-05-01 (×3): 200 mg via ORAL
  Filled 2016-04-29 (×5): qty 1

## 2016-04-29 MED ORDER — RENA-VITE PO TABS
1.0000 | ORAL_TABLET | Freq: Every day | ORAL | Status: DC
  Administered 2016-04-29 – 2016-04-30 (×2): 1 via ORAL
  Filled 2016-04-29 (×2): qty 1

## 2016-04-29 NOTE — Progress Notes (Signed)
Initial Nutrition Assessment  DOCUMENTATION CODES:   Not applicable  INTERVENTION:  Nepro po TID, each supplement provides 425 calories and 19 grams protein.   NUTRITION DIAGNOSIS:   Increased nutrient needs related to wound healing as evidenced by estimated needs.  GOAL:   Patient will meet greater than or equal to 90% of their needs  MONITOR:   PO intake, Supplement acceptance, Labs, Weight trends, Skin, I & O's  REASON FOR ASSESSMENT:   Malnutrition Screening Tool    ASSESSMENT:   46 yo Male who presented for follow-up status post left second stage basilic vein transposition with revision of anastomosis. His fistula was originally created on 12/09/2015 by Dr. [name]. The patient did report numbness in all of the fingers of his left hand following the initial procedure. He was last seen in our office on 03/23/2016 for staple removal. He had some areas of sloughed skin adjacent to his distal incision. Home health was contacted for daily dressing changes. At that time, he reported mild to moderate symptoms in his left hand and forearm.  Pt reports decreased appetite related to arm pain, claims "I do not eat a lot when I am in pain". Pt completed 100% of meal today.  Pt said he only ate 1-2 meals per day PTA depending on how he is feeling. Per pt report, he has had recent weight loss of 24 lbs in 2 weeks. Weight history shows a fluctuation in weights likely due to fluid retention.   Pt was very engaged in the interaction. Pt is already knowledgeable on the renal diet but interested in more education. Pt asked for substitutes of some of his favorite foods, ice cream and sodas. Pt reports trying to stay "on top" of his diet for his health. Pt asked for foods that will help him gain weight and muscle back.   Pt is willing to continue the nutritional supplementation drink of Nepro even if he has to "force it down".  Nutrition Focused Physical Exam showed pt had no muscle or fat  depletion.  Diet Order:  Diet renal with fluid restriction Fluid restriction: 1200 mL Fluid; Room service appropriate? Yes; Fluid consistency: Thin  Skin:  Wound (see comment) (Left upper arm wound VAC)  Last BM:  1/24  Height:   Ht Readings from Last 1 Encounters:  04/27/16 6\' 2"  (1.88 m)    Weight:   Wt Readings from Last 1 Encounters:  04/29/16 185 lb 3 oz (84 kg)    Ideal Body Weight:  86.4 kg  BMI:  Body mass index is 23.78 kg/m.  Estimated Nutritional Needs:   Kcal:  2100-2300 (25-27 kcal/kg)  Protein:  110-120 grams (1.3-1.4 g/kg)  Fluid:  < 1.2 L / d  EDUCATION NEEDS:   Education needs addressed  Parks Ranger Dietetic Intern

## 2016-04-29 NOTE — Progress Notes (Signed)
[facility] Progress Note   Readmitted 1/24 due to bleeding from left arm after AVF ligation with wound VAC with calciphylaxis- INR at d/c 3.99. Bleeding has been controlled. Coumadin on hold, Waiting on INR down to 2-3  Dialysis Orders: [facility] 3h 71min 86.5 kg= EDW Bath= 2 k , 2.0 ca R IJ cath Hep none (allergic to heparin, using citrate) IV Fe 50/week; no hectorolMircera 100 q 2 weeks - due 1/30 - got 100 Aranesp 1/16 op labs= HGB 11.2 PTH 903 on 03/31/16 Ca 8.6 phos 5.1   Assessment/Plan: 1. Left arm caliphylaxis - low Ca Bath, Na thio with HD; avoid Ca containing products 2. ESRD - TTS - off schedule -had planned HD today and again on Sat - K low on admission - was given one time suppl of KCl; suggested he could have a shorter tmt tomorrow or 3 hours today and tomorrow, but he doesn't want this either; will change to 4 hour tmt for Saturday; may need to suppl with additional K to keep on 2 Ca bath. 3. Anemia - hgb 9.3 - redose Aranesp Sat 100- last dose 1/16  4. Secondary hyperparathyroidism -hx calciphylaxis - stopped hectorol, on senispar- decrease to 90 dose during last admission/, renvela- P on the low side - probably will be higher when eating home diet- no change for now 5. HTN/volume -BP ok has LE edema; on labetolol 300 bid - titrate down to 200 BID-to allow for more UF (EDW lowered 0.5 at d/c 1/24 6. Nutrition - abl 2.5 - severe PCM - renal diet/suppl/vits 7. Severe neuropathy - on neurontin 100 tid 8. Atrial fib - coumadin on hold due to ^ levels and BB  Myriam Jacobson, PA-C [place] [phone] 04/29/2016,9:18 AM  LOS: 1 day   Pt seen, examined and agree w A/P as above. Difficult situation Kelly Splinter MD [place] pager [phone]   04/29/2016, 1:53 PM    Subjective:   Doesn't feel like he can do HD two days in a row because it causes too much pain.  Has only had HD on Tuesday so far this  week - wants to wait until Saturday Objective Vitals:   04/28/16 2056 04/29/16 0007 04/29/16 0443 04/29/16 0830  BP: 114/67  (!) 107/54 112/65  Pulse: 76  82 75  Resp: 18  17 16   Temp: 98.2 F (36.8 C)  98.9 F (37.2 C) 99.1 F (37.3 C)  TempSrc:    Oral  SpO2: 96%  92% 93%  Weight:  84 kg (185 lb 3 oz)    Height:       Physical Exam General: NAD supine in bed Heart: RRR Lungs: no rales Abdomen: soft NT Extremities:1+ LE edema left arm wrapped this am - still some oozing Dialysis Access: right IJ   Additional Objective Labs: Lab Results  Component Value Date   INR 3.39 04/29/2016   INR 3.75 04/28/2016   INR 3.99 74/11/1446    Basic Metabolic Panel:  Recent Labs Lab 04/23/16 0708 04/24/16 1110 04/26/16 0803 04/28/16 0036  NA 151* 142 143 143  K 3.3* 3.9 4.1 4.1  CL 99* 101 103 95*  CO2 25 23 22 24   GLUCOSE 92 92 89 89  BUN 11 9 21* 25*  CREATININE 6.20* 5.16* 8.31* 7.00*  CALCIUM 6.2* 8.3* 7.8* 8.6*  PHOS 2.6 1.8* 3.5  --    Liver Function Tests:  Recent Labs Lab 04/24/16 1110 04/26/16 0352 04/26/16 1856  AST  --  15  --   ALT  --  7*  --   ALKPHOS  --  134*  --   BILITOT  --  0.5  --   PROT  --  5.8*  --   ALBUMIN 2.5* 2.7* 2.5*   CBC:  Recent Labs Lab 04/23/16 0707 04/24/16 1110 04/25/16 0243 04/26/16 0803 04/28/16 0036  WBC 5.2 6.3 5.6 5.8 6.1  HGB 8.8* 9.3* 9.3* 8.9* 9.3*  HCT 29.0* 29.3* 29.3* 27.2* 29.3*  MCV 96.3 92.4 91.6 91.6 92.1  PLT 148* 164 173 158 182   Blood Culture    Component Value Date/Time   SDES ABSCESS LEFT UPPER ARM 04/13/2016 1135   SPECREQUEST NONE 04/13/2016 1135   CULT  04/13/2016 1135    ABUNDANT STAPHYLOCOCCUS AUREUS RARE CANDIDA PARAPSILOSIS NO ANAEROBES ISOLATED    REPTSTATUS 04/18/2016 FINAL 04/13/2016 1135   Iron Studies: No results for input(s): IRON, TIBC, TRANSFERRIN, FERRITIN in the last 72 hours. Lab Results  Component Value Date   INR 3.39 04/29/2016   INR 3.75 04/28/2016   INR 3.99  04/27/2016   Studies/Results: No results found. Medications:  . cinacalcet  120 mg Oral Q breakfast  . feeding supplement (NEPRO CARB STEADY)  237 mL Oral TID BM  . gabapentin  100 mg Oral TID  . labetalol  300 mg Oral BID  . megestrol  400 mg Oral Daily  . pantoprazole  40 mg Oral Daily  . potassium chloride  20-40 mEq Oral Once  . sevelamer carbonate  800 mg Oral TID WC  . sodium chloride flush  3 mL Intravenous Q12H

## 2016-04-29 NOTE — Progress Notes (Signed)
Daily Progress Note   Assessment/Planning: S/p debridement and multiple VAC chg to LUA wound due to calciphylaxis, s/p ligation of fistula   Bleeding controlled with Avitene  Wound care c/s for L arm (same previous as per prior admission)  Awaiting INR 2-3.  Continue to hold Coumadin  D/C on Sun if this target met.    Will need to make certain outpatient wound care is setup  Follow up Dr. Leland Johns in early Feb   Subjective    Pain ok  Objective Vitals:   04/28/16 2056 04/29/16 0007 04/29/16 0443 04/29/16 0830  BP: 114/67  (!) 107/54 112/65  Pulse: 76  82 75  Resp: _0 Temp: 98.2 F (36.8 C)  98.9 F (37.2 C) 99.1 F (37.3 C)  TempSrc:    Oral  SpO2: 96%  92% 93%  Weight:  185 lb 3 oz (84 kg)    Height:         Intake/Output Summary (Last 24 hours) at 04/29/16 0842 Last data filed at 04/29/16 0600  Gross per 24 hour  Intake              480 ml  Output                0 ml  Net              480 ml    PULM  CTAB CV  RRR VASC  LUA wound: no active bleeding after Avitene removed, slight venous ooze from wound bed, some re-epithelization evident laterally, recent incision over fistula appears to be healing  Laboratory CBC    Component Value Date/Time   WBC 6.1 04/28/2016 0036   HGB 9.3 (L) 04/28/2016 0036   HCT 29.3 (L) 04/28/2016 0036   PLT 182 04/28/2016 0036    BMET    Component Value Date/Time   NA 143 04/28/2016 0036   K 4.1 04/28/2016 0036   CL 95 (L) 04/28/2016 0036   CO2 24 04/28/2016 0036   GLUCOSE 89 04/28/2016 0036   BUN 25 (H) 04/28/2016 0036   CREATININE 7.00 (H) 04/28/2016 0036   CALCIUM 8.6 (L) 04/28/2016 0036   GFRNONAA 8 (L) 04/28/2016 0036   GFRAA 10 (L) 04/28/2016 0036    Lab Results  Component Value Date   INR 3.39 04/29/2016   INR 3.75 04/28/2016   INR 3.99 04/27/2016    [name], MD, FACS [facility] [place] Office: [phone] Pager: [phone]  04/29/2016, 8:42  AM

## 2016-04-30 LAB — RENAL FUNCTION PANEL
Albumin: 2.4 g/dL — ABNORMAL LOW (ref 3.5–5.0)
Anion gap: 22 — ABNORMAL HIGH (ref 5–15)
BUN: 55 mg/dL — ABNORMAL HIGH (ref 6–20)
CO2: 22 mmol/L (ref 22–32)
Calcium: 8 mg/dL — ABNORMAL LOW (ref 8.9–10.3)
Chloride: 97 mmol/L — ABNORMAL LOW (ref 101–111)
Creatinine, Ser: 10.77 mg/dL — ABNORMAL HIGH (ref 0.61–1.24)
GFR calc Af Amer: 6 mL/min — ABNORMAL LOW (ref >60)
GFR calc non Af Amer: 5 mL/min — ABNORMAL LOW (ref >60)
Glucose, Bld: 100 mg/dL — ABNORMAL HIGH (ref 65–99)
Phosphorus: 3.3 mg/dL (ref 2.5–4.6)
Potassium: 4.6 mmol/L (ref 3.5–5.1)
Sodium: 141 mmol/L (ref 135–145)

## 2016-04-30 LAB — CBC
HCT: 23.5 % — ABNORMAL LOW (ref 39.0–52.0)
Hemoglobin: 7.3 g/dL — ABNORMAL LOW (ref 13.0–17.0)
MCH: 28.7 pg (ref 26.0–34.0)
MCHC: 31.1 g/dL (ref 30.0–36.0)
MCV: 92.5 fL (ref 78.0–100.0)
Platelets: 173 10*3/uL (ref 150–400)
RBC: 2.54 MIL/uL — ABNORMAL LOW (ref 4.22–5.81)
RDW: 16.9 % — ABNORMAL HIGH (ref 11.5–15.5)
WBC: 6.8 10*3/uL (ref 4.0–10.5)

## 2016-04-30 LAB — PROTIME-INR
INR: 2.63
PROTHROMBIN TIME: 28.6 s — AB (ref 11.4–15.2)

## 2016-04-30 LAB — PREPARE RBC (CROSSMATCH)

## 2016-04-30 LAB — ABO/RH: ABO/RH(D): A POS

## 2016-04-30 MED ORDER — SODIUM CHLORIDE 0.9 % IV SOLN
Freq: Once | INTRAVENOUS | Status: AC
  Administered 2016-04-30: 14:00:00 via INTRAVENOUS

## 2016-04-30 MED ORDER — SODIUM CHLORIDE 0.9 % IV SOLN: 100.0000 mL | INTRAVENOUS | Status: DC | PRN

## 2016-04-30 MED ORDER — LIDOCAINE-PRILOCAINE 2.5-2.5 % EX CREA: 1.0000 "application " | TOPICAL_CREAM | CUTANEOUS | Status: DC | PRN

## 2016-04-30 MED ORDER — LIDOCAINE HCL (PF) 1 % IJ SOLN: 5.0000 mL | INTRAMUSCULAR | Status: DC | PRN

## 2016-04-30 MED ORDER — PENTAFLUOROPROP-TETRAFLUOROETH EX AERO: 1.0000 "application " | INHALATION_SPRAY | CUTANEOUS | Status: DC | PRN

## 2016-04-30 MED ORDER — DARBEPOETIN ALFA 100 MCG/0.5ML IJ SOSY
PREFILLED_SYRINGE | INTRAMUSCULAR | Status: AC
  Filled 2016-04-30: qty 0.5

## 2016-04-30 MED ORDER — ANTICOAGULANT SODIUM CITRATE 4% (200MG/5ML) IV SOLN
5.0000 mL | Freq: Once | Status: AC
Start: 2016-04-30 — End: 2016-04-30
  Administered 2016-04-30: 5 mL
  Filled 2016-04-30: qty 250

## 2016-04-30 MED ORDER — ALTEPLASE 2 MG IJ SOLR: 2.0000 mg | Freq: Once | INTRAMUSCULAR | Status: DC | PRN

## 2016-04-30 NOTE — Progress Notes (Signed)
[facility] Progress Note   Readmitted 1/24 due to bleeding from left arm after AVF ligation with wound VAC with calciphylaxis- INR at d/c 3.99. Bleeding has been controlled. Coumadin on hold, Waiting on INR down to 2-3  Dialysis Orders: [facility] 3h 60min 86.5 kg= EDW Bath= 2 k , 2.0 ca R IJ cath Hep none (allergic to heparin, using citrate) IV Fe 50/week; no hectorolMircera 100 q 2 weeks - due 1/30 - got 100 Aranesp 1/16 op labs= HGB 11.2 PTH 903 on 03/31/16 Ca 8.6 phos 5.1   Assessment/Plan: 1. Left arm caliphylaxis/ s/p surgery last admission with post d/c bleeding that has stopped/ likely exacerbated by ^ INR - low Ca Bath, Na thio with HD; avoid Ca containing products 2. ESRD -declined off schedule tmt Friday - running 4 hours today - ^ goal to 3.5 -has sig LE edema- labs pending - on 2 K bath - suppl K if needed due to need for 2 Ca bath 3. Anemia - hgb 9.3 - redose Aranesp 1/27 100- last dose 1/16  4. Secondary hyperparathyroidism -hx calciphylaxis - stopped hectorol, on senispar- decrease to 90 dose during last admission/, renvela- P on the low side - probably will be higher when eating home diet- no change for now 5. HTN/volume -BP ok has LE edema; was on labetolol 300 bid - titrated down to 200 BID-to allow for more UF (EDW lowered 0.5 at d/c 1/24 6. Nutrition - abl 2.5 - severe PCM - renal diet/suppl/vits- iintake has been poor lowering EDW 7. Severe neuropathy - on neurontin 100 tid 8. Atrial fib - INR high on admission -was high at d/c - down to 2.63 1/27  levels and BB  Myriam Jacobson, PA-C [place] [phone] 04/30/2016,8:23 AM  LOS: 2 days   Pt seen, examined and agree w A/P as above.  Kelly Splinter MD [employer] pager [phone]   04/30/2016, 10:50 AM    Subjective:   No c/o. Thinks he might go home Monday.  Objective Vitals:   04/30/16 0505 04/30/16 0730 04/30/16 0734 04/30/16 0800   BP: 125/73 134/74 128/81 129/79  Pulse: 71 71 71 72  Resp: 20 16 18  (!) 21  Temp: 98.6 F (37 C) 98.6 F (37 C)    TempSrc: Oral Oral    SpO2: 94% 96%    Weight:  88.9 kg (195 lb 15.8 oz)    Height:       Physical Exam goal ^ to 3.5 General: NAD on HD Heart:RRR Lungs: no rales Abdomen: soft NT Extremities: ++LE edema/extremely tender to touch, left arm wound wrapped; bleeding seems to have stopped Dialysis Access: right IJ  Additional Objective Labs: Basic Metabolic Panel:  Recent Labs Lab 04/24/16 1110 04/26/16 0803 04/28/16 0036  NA 142 143 143  K 3.9 4.1 4.1  CL 101 103 95*  CO2 23 22 24   GLUCOSE 92 89 89  BUN 9 21* 25*  CREATININE 5.16* 8.31* 7.00*  CALCIUM 8.3* 7.8* 8.6*  PHOS 1.8* 3.5  --    Liver Function Tests:  Recent Labs Lab 04/24/16 1110 04/26/16 0352 04/26/16 0803  AST  --  15  --   ALT  --  7*  --   ALKPHOS  --  134*  --   BILITOT  --  0.5  --   PROT  --  5.8*  --   ALBUMIN 2.5* 2.7* 2.5*   No results for input(s): LIPASE, AMYLASE in the  last 168 hours. CBC:  Recent Labs Lab 04/24/16 1110 04/25/16 0243 04/26/16 0803 04/28/16 0036  WBC 6.3 5.6 5.8 6.1  HGB 9.3* 9.3* 8.9* 9.3*  HCT 29.3* 29.3* 27.2* 29.3*  MCV 92.4 91.6 91.6 92.1  PLT 164 173 158 182   Blood Culture    Component Value Date/Time   SDES ABSCESS LEFT UPPER ARM 04/13/2016 1135   SPECREQUEST NONE 04/13/2016 1135   CULT  04/13/2016 1135    ABUNDANT STAPHYLOCOCCUS AUREUS RARE CANDIDA PARAPSILOSIS NO ANAEROBES ISOLATED    REPTSTATUS 04/18/2016 FINAL 04/13/2016 1135    Cardiac Enzymes: No results for input(s): CKTOTAL, CKMB, CKMBINDEX, TROPONINI in the last 168 hours. CBG: No results for input(s): GLUCAP in the last 168 hours. Iron Studies: No results for input(s): IRON, TIBC, TRANSFERRIN, FERRITIN in the last 72 hours. Lab Results  Component Value Date   INR 2.63 04/30/2016   INR 3.39 04/29/2016   INR 3.75 04/28/2016   Studies/Results: No results  found. Medications:  . cinacalcet  90 mg Oral Q breakfast  . darbepoetin (ARANESP) injection - DIALYSIS  100 mcg Intravenous Q Sat-HD  . feeding supplement (NEPRO CARB STEADY)  237 mL Oral TID BM  . gabapentin  100 mg Oral TID  . labetalol  200 mg Oral BID  . megestrol  400 mg Oral Daily  . multivitamin  1 tablet Oral QHS  . pantoprazole  40 mg Oral Daily  . potassium chloride  20-40 mEq Oral Once  . sevelamer carbonate  800 mg Oral TID WC  . sodium chloride flush  3 mL Intravenous Q12H  . sodium thiosulfate infusion for calciphylaxis  25 g Intravenous Q T,Th,Sa-HD

## 2016-04-30 NOTE — Progress Notes (Signed)
  Progress Note    04/30/2016 2:34 PM * No surgery found *  Subjective:  On hd without complaint  Vitals:   04/30/16 1329 04/30/16 1346  BP: 118/60 107/63  Pulse: 78 71  Resp:  18  Temp:  98.8 F (37.1 C)    Physical Exam: aaox3 Non labored respiration Dressing to left arm cdi  CBC    Component Value Date/Time   WBC 6.8 04/30/2016 0756   RBC 2.54 (L) 04/30/2016 0756   HGB 7.3 (L) 04/30/2016 0756   HCT 23.5 (L) 04/30/2016 0756   PLT 173 04/30/2016 0756   MCV 92.5 04/30/2016 0756   MCH 28.7 04/30/2016 0756   MCHC 31.1 04/30/2016 0756   RDW 16.9 (H) 04/30/2016 0756    BMET    Component Value Date/Time   NA 141 04/30/2016 0756   K 4.6 04/30/2016 0756   CL 97 (L) 04/30/2016 0756   CO2 22 04/30/2016 0756   GLUCOSE 100 (H) 04/30/2016 0756   BUN 55 (H) 04/30/2016 0756   CREATININE 10.77 (H) 04/30/2016 0756   CALCIUM 8.0 (L) 04/30/2016 0756   GFRNONAA 5 (L) 04/30/2016 0756   GFRAA 6 (L) 04/30/2016 0756    INR    Component Value Date/Time   INR 2.63 04/30/2016 0458     Intake/Output Summary (Last 24 hours) at 04/30/16 1434 Last data filed at 04/30/16 1134  Gross per 24 hour  Intake              357 ml  Output             3000 ml  Net            -2643 ml     Assessment:  46 y.o. male is here with calciphylaxis wound to left upper arm, on hd via catheter  Plan: Wound care every other day inr now target Will plan d/c when home wound care arranged   Cayde Held C. Donzetta Matters, MD [facility] [place] Office: [phone] Pager: [phone]  04/30/2016 2:34 PM

## 2016-05-01 LAB — TYPE AND SCREEN
ABO/RH(D): A POS
Antibody Screen: NEGATIVE
UNIT DIVISION: 0

## 2016-05-01 LAB — PROTIME-INR
INR: 1.94
PROTHROMBIN TIME: 22.4 s — AB (ref 11.4–15.2)

## 2016-05-01 MED ORDER — SODIUM CHLORIDE 0.9 % IV SOLN: 62.5000 mg | INTRAVENOUS | Status: DC

## 2016-05-01 NOTE — Progress Notes (Addendum)
[facility] [place]  Subjective  - Doing better.   Objective 119/79 71 98.9 F (37.2 C) (Oral) 16 97%  Intake/Output Summary (Last 24 hours) at 05/01/16 0942 Last data filed at 05/01/16 0631  Gross per 24 hour  Intake              945 ml  Output             3000 ml  Net            -2055 ml    Left UE dressing changed with silver alginate. Wound with beefy red base   Assessment/Planning: 46 y.o. male is here with calciphylaxis wound to left upper arm, on hd via catheter   Discharge home with RN for wound care Appointments made with Plastic surgeon, Dr. [name] and [facility] He has pain medication at home.  Laurence Slate [facility] 05/01/2016 9:42 AM --  Laboratory Lab Results:  Recent Labs  04/30/16 0756  WBC 6.8  HGB 7.3*  HCT 23.5*  PLT 173   BMET  Recent Labs  04/30/16 0756  NA 141  K 4.6  CL 97*  CO2 22  GLUCOSE 100*  BUN 55*  CREATININE 10.77*  CALCIUM 8.0*    COAG Lab Results  Component Value Date   INR 1.94 05/01/2016   INR 2.63 04/30/2016   INR 3.39 04/29/2016   No results found for: PTT   I have independently interviewed patient and agree with PA assessment and plan above.   Anairis Knick C. Donzetta Matters, MD [facility] [place] Office: [phone] Pager: [phone]

## 2016-05-01 NOTE — Progress Notes (Signed)
[facility] Progress Note   Readmitted 1/24 due to bleeding from left arm after AVF ligation with wound VAC with calciphylaxis- INR at d/c 3.99. Bleeding has been controlled. INR has drifted down  Dialysis Orders: [facility] 3h 68min 86.5 kg= EDW Bath= 2 k , 2.0 ca R IJ cath Hep none (allergic to heparin, using citrate) IV Fe 50/week; no hectorolMircera 100 q 2 weeks - due 1/30 - got 100 Aranesp 1/16 op labs= HGB 11.2 PTH 903 on 03/31/16 Ca 8.6 phos 5.1   Assessment/Plan: 1. Left arm caliphylaxis - low Ca Bath, Na thio with HD; avoid Ca containing products 2. ESRD - TTS - had dialysis 1/27 (only agreed to 2 tmts last week)- next HD Tuesday 3. Anemia - hgb 9.3> 7.3 1/27  - redosed Aranesp 100 redosed 1/27 - transfused 1 unit PRBC - weekly IV Fe 4. Secondary hyperparathyroidism -hx calciphylaxis - stopped hectorol, on senispar- decrease to 90 dose during last admission/, renvela- P on the low side - probably will be higher when eating home diet- no change for now 5. HTN/volume - BP ok-on labetolol 300 bid - titrated down to 200 BID-to allow for more UF (EDW was  lowered 0.5 at d/c 1/24; net UF 1/27 3 L with post wt 85.9- BP controlled; still need to gently lower EDW 6. Nutrition - abl 2.4 - severe PCM - renal diet/suppl/vits 7. Severe neuropathy - on neurontin 100 tid 8. Atrial fib - INR down to 1.94 BB 9. Disp - ok for d/c per renal but need to determine coumadin dose for d/c- not resumed yet  Myriam Jacobson, PA-C [place] [phone] 05/01/2016,8:06 AM  LOS: 3 days   Pt seen, examined and agree w A/P as above. Will ask pharmacy for new coumadin dose recommendation at dc today.  Continue coumadin for now due to issue of recent arm surgery/ wound healing / risk of bleeding and reversibility. When these issues have resolved we can consider switching to Eliquis if needed. The recommendation to dc coumadin in calciphylaxis is not  absolute and usually entails considering risks and benefits regarding comorbidities, as in this case.   Kelly Splinter MD [employer] pager [phone]   05/01/2016, 10:59 AM    Subjective:   Ate breakfast - no c/o  Objective Vitals:   04/30/16 1346 04/30/16 1628 04/30/16 2221 05/01/16 0522  BP: 107/63 111/66 124/78 119/79  Pulse: 71 85 75 71  Resp: 18 18 16 16   Temp: 98.8 F (37.1 C) 99.3 F (37.4 C) 98.9 F (37.2 C) 98.9 F (37.2 C)  TempSrc: Oral Oral Oral Oral  SpO2: 91% 99% 93% 97%  Weight:      Height:       Physical Exam General: NAD Heart: RRR Lungs: no rales Abdomen: soft NT Extremities:  ++ LE edema;Left arm wrapped - mild edema Dialysis Access:  Right IJ  Additional Objective Labs: Basic Metabolic Panel:  Recent Labs Lab 04/24/16 1110 04/26/16 0803 04/28/16 0036 04/30/16 0756  NA 142 143 143 141  K 3.9 4.1 4.1 4.6  CL 101 103 95* 97*  CO2 23 22 24 22   GLUCOSE 92 89 89 100*  BUN 9 21* 25* 55*  CREATININE 5.16* 8.31* 7.00* 10.77*  CALCIUM 8.3* 7.8* 8.6* 8.0*  PHOS 1.8* 3.5  --  3.3   Liver Function Tests:  Recent Labs Lab 04/26/16 0352 04/26/16 0803 04/30/16 0756  AST 15  --   --   ALT  7*  --   --   ALKPHOS 134*  --   --   BILITOT 0.5  --   --   PROT 5.8*  --   --   ALBUMIN 2.7* 2.5* 2.4*   CBC:  Recent Labs Lab 04/24/16 1110 04/25/16 0243 04/26/16 0803 04/28/16 0036 04/30/16 0756  WBC 6.3 5.6 5.8 6.1 6.8  HGB 9.3* 9.3* 8.9* 9.3* 7.3*  HCT 29.3* 29.3* 27.2* 29.3* 23.5*  MCV 92.4 91.6 91.6 92.1 92.5  PLT 164 173 158 182 173   Lab Results  Component Value Date   INR 1.94 05/01/2016   INR 2.63 04/30/2016   INR 3.39 04/29/2016  Medications:  . cinacalcet  90 mg Oral Q breakfast  . darbepoetin (ARANESP) injection - DIALYSIS  100 mcg Intravenous Q Sat-HD  . feeding supplement (NEPRO CARB STEADY)  237 mL Oral TID BM  . gabapentin  100 mg Oral TID  . labetalol  200 mg Oral BID  . megestrol  400 mg Oral Daily   . multivitamin  1 tablet Oral QHS  . pantoprazole  40 mg Oral Daily  . potassium chloride  20-40 mEq Oral Once  . sevelamer carbonate  800 mg Oral TID WC  . sodium chloride flush  3 mL Intravenous Q12H  . sodium thiosulfate infusion for calciphylaxis  25 g Intravenous Q T,Th,Sa-HD

## 2016-05-01 NOTE — Care Management Note (Addendum)
Case Management Note  Patient Details  Name: Corey [name] MRN: [id number] Date of Birth: [birth year]  Subjective/Objective:   45 y.o. M admitted 04/27/2016 to be discharged today with [facility] provided by [facility] who will provide RN to change dressings QOD with Aquacell beginning Wed 05/04/2016. Contacted Magda Paganini at [facility] @ [phone]  She will have on call administrator return call to confirm referral. (Faxed F2F and [facility] orders with Facesheet to Fax # [phone]  to ensure referral). Confirmed need for Hospital Bed with MD and referred to [facility] with [facility] for home delivery. Pt to follow up with [facility] for PCP until he can search [email or web address] and find PCP. No voiced questions or concerns.                 Action/Plan:CM will sign off for now but will be available should additional discharge needs arise or disposition change.    Expected Discharge Date:  05/01/16               Expected Discharge Plan:  [place]  In-House Referral:  PCP / Health Connect  Discharge planning Services  CM Consult  Post Acute Care Choice:  Durable Medical Equipment, Home Health Choice offered to:  Patient  DME Arranged:  Hospital bed DME Agency:  [place]:  RN [facility] Agency:  [place]  Status of Service:  Completed, signed off  If discussed at [place] of Stay Meetings, dates discussed:    Additional Comments:  Delrae Sawyers, RN 05/01/2016, 10:32 AM

## 2016-05-01 NOTE — Progress Notes (Signed)
Discharge instructions and medications discussed with patient.  All questions answered.  

## 2016-05-01 NOTE — Progress Notes (Signed)
ANTICOAGULATION CONSULT NOTE - Initial Consult  Pharmacy Consult for warfarin or eliquis Indication: atrial fibrillation  Allergies  Allergen Reactions  . Heparin Other (See Comments)    UNSPECIFIED REACTION :  On Coumadin since 2004 ? ? HIT ? ?    Patient Measurements: Height: 6\' 2"  (188 cm) Weight: 189 lb 6 oz (85.9 kg) IBW/kg (Calculated) : 82.2  Vital Signs: Temp: 98.9 F (37.2 C) (01/28 0522) Temp Source: Oral (01/28 0522) BP: 119/79 (01/28 0522) Pulse Rate: 71 (01/28 0522)  Labs:  Recent Labs  04/29/16 0615 04/30/16 0458 04/30/16 0756 05/01/16 0630  HGB  --   --  7.3*  --   HCT  --   --  23.5*  --   PLT  --   --  173  --   LABPROT 35.1* 28.6*  --  22.4*  INR 3.39 2.63  --  1.94  CREATININE  --   --  10.77*  --     Estimated Creatinine Clearance: 10.1 mL/min (by C-G formula based on SCr of 10.77 mg/dL (H)).   Medical History: Past Medical History:  Diagnosis Date  . A-fib ([place])   . Arthritis    hands and shoulders  . Blindness and low vision    "Stargardt disease"  . Dissection of aorta ([place]) 2004  . Dysrhythmia    A-fib  . ESRD (end stage renal disease) ([place])   . Headache   . History of cardioversion 2014  . Hypertension   . Neuropathy ([place])   . Non-healing non-surgical wound 03/2016  . Paralysis ([place])    due to dissection of aorta in 2004, lower extremities  . Pneumonia    Assessment: Corey [name] is a 46 y.o. male, with left arm wound after removal of AV graft with calciphylaxis. Discharged from hospital 1/24 but returned early on 1/25 complaining of bleeding. Patient was taking warfarin at home for Afib. The warfarin was stopped for the bleeding, INR at admit 3.99. Warfarin is not the best option in calciphylaxis so Eliquis may be a good alternative. Vascular surgery has concerns over Eliquis not being reversible. Dr. Jonnie Finner with renal is following up to get a final decision on which anticoagulation to send the patient out on today. Pharmacy  was asked to determine a regimen for both situations. INR is 1.94 subtherapeutic today. Home dose previous to admission was 7.5mg  daily. Patient is also ESRD with HD.  Goal of Therapy:  INR 2-3 if continued on warfarin Monitor platelets by anticoagulation protocol: Yes   Plan:  -If restarting warfarin: recommend home regimen of 7.5mg  on TTSS, and 5mg  on MWF. This is a total weekly dose 45mg , a 15% reduction from his previous weekly dose of 52.5mg . -If starting eliquis, recommend dose of 2.5mg  twice daily given ESRD on HD  Corey [name]), PharmD  PGY1 Pharmacy Resident Pager: [phone] 05/01/2016 11:17 AM

## 2016-05-02 ENCOUNTER — Encounter: Payer: Self-pay | Admitting: Vascular Surgery

## 2016-05-02 DIAGNOSIS — I4891 Unspecified atrial fibrillation: Secondary | ICD-10-CM | POA: Diagnosis not present

## 2016-05-02 DIAGNOSIS — I120 Hypertensive chronic kidney disease with stage 5 chronic kidney disease or end stage renal disease: Secondary | ICD-10-CM | POA: Diagnosis not present

## 2016-05-02 DIAGNOSIS — T8189XD Other complications of procedures, not elsewhere classified, subsequent encounter: Secondary | ICD-10-CM | POA: Diagnosis not present

## 2016-05-02 DIAGNOSIS — N186 End stage renal disease: Secondary | ICD-10-CM | POA: Diagnosis not present

## 2016-05-02 DIAGNOSIS — M6281 Muscle weakness (generalized): Secondary | ICD-10-CM | POA: Diagnosis not present

## 2016-05-02 DIAGNOSIS — Z992 Dependence on renal dialysis: Secondary | ICD-10-CM | POA: Diagnosis not present

## 2016-05-02 NOTE — Discharge Summary (Signed)
[facility] Discharge Summary   Patient ID:  Corey [name] MRN: [id number] DOB/AGE: October 13, 1970 46 y.o.  Admit date: 04/27/2016 Discharge date: 05/01/2016 Date of Surgery:  Surgeon: * Surgery not found *  Admission Diagnosis: Bleeding from wound [T14.8XXA]  Discharge Diagnoses:  Bleeding from wound [T14.8XXA]  Secondary Diagnoses: Past Medical History:  Diagnosis Date  . A-fib ([place])   . Arthritis    hands and shoulders  . Blindness and low vision    "Stargardt disease"  . Dissection of aorta ([place]) 2004  . Dysrhythmia    A-fib  . ESRD (end stage renal disease) ([place])   . Headache   . History of cardioversion 2014  . Hypertension   . Neuropathy ([place])   . Non-healing non-surgical wound 03/2016  . Paralysis ([place])    due to dissection of aorta in 2004, lower extremities  . Pneumonia       Discharged Condition: good  HPI: Corey [name] is a 46 y.o. male, with left arm wound after removal of AV graft with calciphylaxis. Discharged from hospital yesterday.  Returned to ER early this morning complaining of bleeding.  Pt is on Coumadin for afib. Other medical problems include ESRD hypertension.   Hospital Course:  Corey [name] is a 46 y.o. male is S/P debridement and multiple VAC chg to LUA wound due to calciphylaxis, s/p ligation of fistula. Bleeding has dissipated.  INR decreased now 1.9 and stable. Every other day dressing changes with silver alginate.   05/01/2016 Discharge home with RN for wound care Appointments made with Plastic surgeon, Dr. [name] and [facility] He has pain medication at home.   Consults:  Treatment Team:  Roney Jaffe, MD  Significant Diagnostic Studies: CBC Lab Results  Component Value Date   WBC 6.8 04/30/2016   HGB 7.3 (L) 04/30/2016   HCT 23.5 (L) 04/30/2016   MCV 92.5 04/30/2016   PLT 173 04/30/2016    BMET    Component Value Date/Time   NA 141 04/30/2016 0756   K 4.6 04/30/2016 0756   CL 97 (L) 04/30/2016 0756   CO2 22  04/30/2016 0756   GLUCOSE 100 (H) 04/30/2016 0756   BUN 55 (H) 04/30/2016 0756   CREATININE 10.77 (H) 04/30/2016 0756   CALCIUM 8.0 (L) 04/30/2016 0756   GFRNONAA 5 (L) 04/30/2016 0756   GFRAA 6 (L) 04/30/2016 0756   COAG Lab Results  Component Value Date   INR 1.94 05/01/2016   INR 2.63 04/30/2016   INR 3.39 04/29/2016     Disposition:  Discharge to :Home Discharge Instructions    Call MD for:  redness, tenderness, or signs of infection (pain, swelling, bleeding, redness, odor or green/yellow discharge around incision site)    Complete by:  As directed    Call MD for:  severe or increased pain, loss or decreased feeling  in affected limb(s)    Complete by:  As directed    Call MD for:  temperature >100.5    Complete by:  As directed    Discharge instructions    Complete by:  As directed    May wash around open wound on left UE   Resume previous diet    Complete by:  As directed      Allergies as of 05/01/2016      Reactions   Heparin Other (See Comments)   UNSPECIFIED REACTION :  On Coumadin since 2004 ? ? HIT ? ?      Medication List  TAKE these medications   cinacalcet 90 MG tablet Commonly known as:  SENSIPAR Take 90 mg by mouth daily with supper.   feeding supplement (NEPRO CARB STEADY) Liqd Take 237 mLs by mouth 3 (three) times daily between meals.   gabapentin 100 MG capsule Commonly known as:  NEURONTIN Take 1 capsule (100 mg total) by mouth 3 (three) times daily.   HYDROmorphone 2 MG tablet Commonly known as:  DILAUDID Take 0.5 tablets (1 mg total) by mouth as needed (prior to dressing changes on L arm every Monday, Wednesday, Friday).   labetalol 300 MG tablet Commonly known as:  NORMODYNE Take 300 mg by mouth 2 (two) times daily.   megestrol 400 MG/10ML suspension Commonly known as:  MEGACE Take 10 mLs (400 mg total) by mouth daily.   oxyCODONE-acetaminophen 5-325 MG tablet Commonly known as:  PERCOCET/ROXICET Take 1 tablet by mouth  every 8 (eight) hours as needed for severe pain.   sevelamer carbonate 800 MG tablet Commonly known as:  RENVELA Take 800 mg by mouth 3 (three) times daily with meals.   warfarin 5 MG tablet Commonly known as:  COUMADIN Take 1.5 tablets (7.5 mg total) by mouth daily. What changed:  when to take this      Verbal and written Discharge instructions given to the patient. Wound care per Discharge AVS Follow-up Information    [facility] Follow up.   Specialty:  Home Health Services Why:  [facility] will provide wound care beginning Wednesday for Every other day dressing changes with Aquacell. A representative from the agency will be in touch within 24 hours to make arrangements for initial visit Contact information: [address] [place]  [address] [phone]        [facility] Follow up.   Why:  Hospital Bed will be delivered to your home Contact information: [address] [place] [address] [phone]        [place]. Go on 05/05/2016.   Why:  Open Clinic Hours are each Thursday at n08:30. Please go and do not leave until you are seen or given a follow up appt. Ask for follow up appt and PCP.  Contact information: [place] [address] [phone]          Signed: Laurence Slate [facility] 05/02/2016, 1:31 PM

## 2016-05-03 DIAGNOSIS — D509 Iron deficiency anemia, unspecified: Secondary | ICD-10-CM | POA: Diagnosis not present

## 2016-05-03 DIAGNOSIS — D631 Anemia in chronic kidney disease: Secondary | ICD-10-CM | POA: Diagnosis not present

## 2016-05-03 DIAGNOSIS — N2581 Secondary hyperparathyroidism of renal origin: Secondary | ICD-10-CM | POA: Diagnosis not present

## 2016-05-03 DIAGNOSIS — Z992 Dependence on renal dialysis: Secondary | ICD-10-CM | POA: Diagnosis not present

## 2016-05-03 DIAGNOSIS — I482 Chronic atrial fibrillation: Secondary | ICD-10-CM | POA: Diagnosis not present

## 2016-05-03 DIAGNOSIS — N186 End stage renal disease: Secondary | ICD-10-CM | POA: Diagnosis not present

## 2016-05-03 NOTE — Progress Notes (Signed)
    Postoperative Access Visit   History of Present Illness  Corey [name] is a 46 y.o. year old male who presents for postoperative follow-up for: debridement of LUA, ligation of fistula for calciphylaxis..  The patient's LUA wound appears clean.  He is getting M-W-F Aquacel Ag dressing changes.  The patient notes L hand para/anesthesia improved.  Noted "strange feelings" in L hand.    For VQI Use Only  PRE-ADM LIVING: Home  AMB STATUS: Wheelchair   Physical Examination Vitals:   05/06/16 1309  BP: 127/69  Pulse: 80  Resp: 16  Temp: 97 F (36.1 C)    LUE: strongly palpable radial pulse, L hand grip 4-5/5, sensation intact, L upper arm wound: clean, good granulation, no bleeding, prior fistula exposure incision appears to be healing, some blister of inferior aspect, no evidence of infection   Medical Decision Making  Corey [name] is a 46 y.o. year old male who presents s/p LUA debridement for calciphylaxis, ligation of L BVT.   The wound appears to be healing well and should be ready for graft if pt elects to proceed.  He has follow up with Dr. Leland Johns this coming week.  The patient appears to be healing the fistula exposure incision, so I suspect he will likely heal his STSG harvest site.  Obviously calciphylaxis increases his risk of wound complication, so the patient is considering his options.  I will see the patient again on 19 FEB 18 as I'll be off the coming 2 Fridays.  Thank you for allowing [place] to participate in this patient's care.  [name], MD, FACS [facility] [place] Office: [phone] Pager: [phone]

## 2016-05-04 DIAGNOSIS — I120 Hypertensive chronic kidney disease with stage 5 chronic kidney disease or end stage renal disease: Secondary | ICD-10-CM | POA: Diagnosis not present

## 2016-05-04 DIAGNOSIS — N186 End stage renal disease: Secondary | ICD-10-CM | POA: Diagnosis not present

## 2016-05-04 DIAGNOSIS — T8189XD Other complications of procedures, not elsewhere classified, subsequent encounter: Secondary | ICD-10-CM | POA: Diagnosis not present

## 2016-05-04 DIAGNOSIS — M6281 Muscle weakness (generalized): Secondary | ICD-10-CM | POA: Diagnosis not present

## 2016-05-04 DIAGNOSIS — Z992 Dependence on renal dialysis: Secondary | ICD-10-CM | POA: Diagnosis not present

## 2016-05-04 DIAGNOSIS — I129 Hypertensive chronic kidney disease with stage 1 through stage 4 chronic kidney disease, or unspecified chronic kidney disease: Secondary | ICD-10-CM | POA: Diagnosis not present

## 2016-05-04 DIAGNOSIS — I4891 Unspecified atrial fibrillation: Secondary | ICD-10-CM | POA: Diagnosis not present

## 2016-05-05 ENCOUNTER — Telehealth: Payer: Self-pay | Admitting: *Deleted

## 2016-05-05 DIAGNOSIS — D509 Iron deficiency anemia, unspecified: Secondary | ICD-10-CM | POA: Diagnosis not present

## 2016-05-05 DIAGNOSIS — D631 Anemia in chronic kidney disease: Secondary | ICD-10-CM | POA: Diagnosis not present

## 2016-05-05 DIAGNOSIS — Z992 Dependence on renal dialysis: Secondary | ICD-10-CM | POA: Diagnosis not present

## 2016-05-05 DIAGNOSIS — N186 End stage renal disease: Secondary | ICD-10-CM | POA: Diagnosis not present

## 2016-05-05 DIAGNOSIS — N2581 Secondary hyperparathyroidism of renal origin: Secondary | ICD-10-CM | POA: Diagnosis not present

## 2016-05-05 NOTE — Telephone Encounter (Signed)
Spoke with Traci nurse with [facility] asking if we follow his INR He has not been seen since October 2017  Recently in hospital INR on 05/01/2016 1.94 so order given to check INR tomorrow when she visits and she states understanding

## 2016-05-06 ENCOUNTER — Encounter: Payer: Self-pay | Admitting: Vascular Surgery

## 2016-05-06 ENCOUNTER — Ambulatory Visit (INDEPENDENT_AMBULATORY_CARE_PROVIDER_SITE_OTHER): Payer: Self-pay | Admitting: Vascular Surgery

## 2016-05-06 ENCOUNTER — Ambulatory Visit (INDEPENDENT_AMBULATORY_CARE_PROVIDER_SITE_OTHER): Payer: Medicare Other | Admitting: Internal Medicine

## 2016-05-06 VITALS — BP 127/69 | HR 80 | Temp 97.0°F | Resp 16 | Ht 74.0 in | Wt 189.0 lb

## 2016-05-06 DIAGNOSIS — Z992 Dependence on renal dialysis: Secondary | ICD-10-CM

## 2016-05-06 DIAGNOSIS — I120 Hypertensive chronic kidney disease with stage 5 chronic kidney disease or end stage renal disease: Secondary | ICD-10-CM | POA: Diagnosis not present

## 2016-05-06 DIAGNOSIS — M6281 Muscle weakness (generalized): Secondary | ICD-10-CM | POA: Diagnosis not present

## 2016-05-06 DIAGNOSIS — N186 End stage renal disease: Secondary | ICD-10-CM | POA: Diagnosis not present

## 2016-05-06 DIAGNOSIS — I4891 Unspecified atrial fibrillation: Secondary | ICD-10-CM

## 2016-05-06 DIAGNOSIS — T8189XD Other complications of procedures, not elsewhere classified, subsequent encounter: Secondary | ICD-10-CM | POA: Diagnosis not present

## 2016-05-06 LAB — POCT INR: INR: 1.5

## 2016-05-07 DIAGNOSIS — Z992 Dependence on renal dialysis: Secondary | ICD-10-CM | POA: Diagnosis not present

## 2016-05-07 DIAGNOSIS — D631 Anemia in chronic kidney disease: Secondary | ICD-10-CM | POA: Diagnosis not present

## 2016-05-07 DIAGNOSIS — N186 End stage renal disease: Secondary | ICD-10-CM | POA: Diagnosis not present

## 2016-05-07 DIAGNOSIS — N2581 Secondary hyperparathyroidism of renal origin: Secondary | ICD-10-CM | POA: Diagnosis not present

## 2016-05-07 DIAGNOSIS — D509 Iron deficiency anemia, unspecified: Secondary | ICD-10-CM | POA: Diagnosis not present

## 2016-05-09 DIAGNOSIS — I4891 Unspecified atrial fibrillation: Secondary | ICD-10-CM | POA: Diagnosis not present

## 2016-05-09 DIAGNOSIS — Z992 Dependence on renal dialysis: Secondary | ICD-10-CM | POA: Diagnosis not present

## 2016-05-09 DIAGNOSIS — T8189XD Other complications of procedures, not elsewhere classified, subsequent encounter: Secondary | ICD-10-CM | POA: Diagnosis not present

## 2016-05-09 DIAGNOSIS — I120 Hypertensive chronic kidney disease with stage 5 chronic kidney disease or end stage renal disease: Secondary | ICD-10-CM | POA: Diagnosis not present

## 2016-05-09 DIAGNOSIS — M6281 Muscle weakness (generalized): Secondary | ICD-10-CM | POA: Diagnosis not present

## 2016-05-09 DIAGNOSIS — N186 End stage renal disease: Secondary | ICD-10-CM | POA: Diagnosis not present

## 2016-05-10 DIAGNOSIS — N2581 Secondary hyperparathyroidism of renal origin: Secondary | ICD-10-CM | POA: Diagnosis not present

## 2016-05-10 DIAGNOSIS — N186 End stage renal disease: Secondary | ICD-10-CM | POA: Diagnosis not present

## 2016-05-10 DIAGNOSIS — D509 Iron deficiency anemia, unspecified: Secondary | ICD-10-CM | POA: Diagnosis not present

## 2016-05-10 DIAGNOSIS — Z992 Dependence on renal dialysis: Secondary | ICD-10-CM | POA: Diagnosis not present

## 2016-05-10 DIAGNOSIS — D631 Anemia in chronic kidney disease: Secondary | ICD-10-CM | POA: Diagnosis not present

## 2016-05-11 ENCOUNTER — Ambulatory Visit (INDEPENDENT_AMBULATORY_CARE_PROVIDER_SITE_OTHER): Payer: Medicare Other | Admitting: Internal Medicine

## 2016-05-11 DIAGNOSIS — M6281 Muscle weakness (generalized): Secondary | ICD-10-CM | POA: Diagnosis not present

## 2016-05-11 DIAGNOSIS — H5450 Low vision, one eye, unspecified eye: Secondary | ICD-10-CM | POA: Diagnosis not present

## 2016-05-11 DIAGNOSIS — I4891 Unspecified atrial fibrillation: Secondary | ICD-10-CM | POA: Diagnosis not present

## 2016-05-11 DIAGNOSIS — S41102D Unspecified open wound of left upper arm, subsequent encounter: Secondary | ICD-10-CM | POA: Diagnosis not present

## 2016-05-11 DIAGNOSIS — N186 End stage renal disease: Secondary | ICD-10-CM | POA: Diagnosis not present

## 2016-05-11 DIAGNOSIS — Z7901 Long term (current) use of anticoagulants: Secondary | ICD-10-CM | POA: Diagnosis not present

## 2016-05-11 DIAGNOSIS — T8189XD Other complications of procedures, not elsewhere classified, subsequent encounter: Secondary | ICD-10-CM | POA: Diagnosis not present

## 2016-05-11 DIAGNOSIS — Z992 Dependence on renal dialysis: Secondary | ICD-10-CM | POA: Diagnosis not present

## 2016-05-11 DIAGNOSIS — I120 Hypertensive chronic kidney disease with stage 5 chronic kidney disease or end stage renal disease: Secondary | ICD-10-CM | POA: Diagnosis not present

## 2016-05-11 DIAGNOSIS — I7789 Other specified disorders of arteries and arterioles: Secondary | ICD-10-CM | POA: Diagnosis not present

## 2016-05-11 LAB — POCT INR: INR: 2

## 2016-05-12 DIAGNOSIS — D631 Anemia in chronic kidney disease: Secondary | ICD-10-CM | POA: Diagnosis not present

## 2016-05-12 DIAGNOSIS — Z992 Dependence on renal dialysis: Secondary | ICD-10-CM | POA: Diagnosis not present

## 2016-05-12 DIAGNOSIS — N2581 Secondary hyperparathyroidism of renal origin: Secondary | ICD-10-CM | POA: Diagnosis not present

## 2016-05-12 DIAGNOSIS — N186 End stage renal disease: Secondary | ICD-10-CM | POA: Diagnosis not present

## 2016-05-12 DIAGNOSIS — D509 Iron deficiency anemia, unspecified: Secondary | ICD-10-CM | POA: Diagnosis not present

## 2016-05-13 ENCOUNTER — Encounter: Payer: Self-pay | Admitting: Vascular Surgery

## 2016-05-13 DIAGNOSIS — M6281 Muscle weakness (generalized): Secondary | ICD-10-CM | POA: Diagnosis not present

## 2016-05-13 DIAGNOSIS — N186 End stage renal disease: Secondary | ICD-10-CM | POA: Diagnosis not present

## 2016-05-13 DIAGNOSIS — I4891 Unspecified atrial fibrillation: Secondary | ICD-10-CM | POA: Diagnosis not present

## 2016-05-13 DIAGNOSIS — Z992 Dependence on renal dialysis: Secondary | ICD-10-CM | POA: Diagnosis not present

## 2016-05-13 DIAGNOSIS — I120 Hypertensive chronic kidney disease with stage 5 chronic kidney disease or end stage renal disease: Secondary | ICD-10-CM | POA: Diagnosis not present

## 2016-05-13 DIAGNOSIS — T8189XD Other complications of procedures, not elsewhere classified, subsequent encounter: Secondary | ICD-10-CM | POA: Diagnosis not present

## 2016-05-16 ENCOUNTER — Encounter: Payer: Self-pay | Admitting: Vascular Surgery

## 2016-05-16 DIAGNOSIS — M6281 Muscle weakness (generalized): Secondary | ICD-10-CM | POA: Diagnosis not present

## 2016-05-16 DIAGNOSIS — I4891 Unspecified atrial fibrillation: Secondary | ICD-10-CM | POA: Diagnosis not present

## 2016-05-16 DIAGNOSIS — T8189XD Other complications of procedures, not elsewhere classified, subsequent encounter: Secondary | ICD-10-CM | POA: Diagnosis not present

## 2016-05-16 DIAGNOSIS — Z992 Dependence on renal dialysis: Secondary | ICD-10-CM | POA: Diagnosis not present

## 2016-05-16 DIAGNOSIS — I120 Hypertensive chronic kidney disease with stage 5 chronic kidney disease or end stage renal disease: Secondary | ICD-10-CM | POA: Diagnosis not present

## 2016-05-16 DIAGNOSIS — N186 End stage renal disease: Secondary | ICD-10-CM | POA: Diagnosis not present

## 2016-05-17 DIAGNOSIS — D631 Anemia in chronic kidney disease: Secondary | ICD-10-CM | POA: Diagnosis not present

## 2016-05-17 DIAGNOSIS — N186 End stage renal disease: Secondary | ICD-10-CM | POA: Diagnosis not present

## 2016-05-17 DIAGNOSIS — N2581 Secondary hyperparathyroidism of renal origin: Secondary | ICD-10-CM | POA: Diagnosis not present

## 2016-05-17 DIAGNOSIS — D509 Iron deficiency anemia, unspecified: Secondary | ICD-10-CM | POA: Diagnosis not present

## 2016-05-17 DIAGNOSIS — Z992 Dependence on renal dialysis: Secondary | ICD-10-CM | POA: Diagnosis not present

## 2016-05-18 DIAGNOSIS — M6281 Muscle weakness (generalized): Secondary | ICD-10-CM | POA: Diagnosis not present

## 2016-05-18 DIAGNOSIS — T8189XD Other complications of procedures, not elsewhere classified, subsequent encounter: Secondary | ICD-10-CM | POA: Diagnosis not present

## 2016-05-18 DIAGNOSIS — I4891 Unspecified atrial fibrillation: Secondary | ICD-10-CM | POA: Diagnosis not present

## 2016-05-18 DIAGNOSIS — N186 End stage renal disease: Secondary | ICD-10-CM | POA: Diagnosis not present

## 2016-05-18 DIAGNOSIS — Z992 Dependence on renal dialysis: Secondary | ICD-10-CM | POA: Diagnosis not present

## 2016-05-18 DIAGNOSIS — I120 Hypertensive chronic kidney disease with stage 5 chronic kidney disease or end stage renal disease: Secondary | ICD-10-CM | POA: Diagnosis not present

## 2016-05-19 DIAGNOSIS — Z992 Dependence on renal dialysis: Secondary | ICD-10-CM | POA: Diagnosis not present

## 2016-05-19 DIAGNOSIS — D631 Anemia in chronic kidney disease: Secondary | ICD-10-CM | POA: Diagnosis not present

## 2016-05-19 DIAGNOSIS — N2581 Secondary hyperparathyroidism of renal origin: Secondary | ICD-10-CM | POA: Diagnosis not present

## 2016-05-19 DIAGNOSIS — D509 Iron deficiency anemia, unspecified: Secondary | ICD-10-CM | POA: Diagnosis not present

## 2016-05-19 DIAGNOSIS — N186 End stage renal disease: Secondary | ICD-10-CM | POA: Diagnosis not present

## 2016-05-20 DIAGNOSIS — I120 Hypertensive chronic kidney disease with stage 5 chronic kidney disease or end stage renal disease: Secondary | ICD-10-CM | POA: Diagnosis not present

## 2016-05-20 DIAGNOSIS — Z992 Dependence on renal dialysis: Secondary | ICD-10-CM | POA: Diagnosis not present

## 2016-05-20 DIAGNOSIS — I4891 Unspecified atrial fibrillation: Secondary | ICD-10-CM | POA: Diagnosis not present

## 2016-05-20 DIAGNOSIS — T8189XD Other complications of procedures, not elsewhere classified, subsequent encounter: Secondary | ICD-10-CM | POA: Diagnosis not present

## 2016-05-20 DIAGNOSIS — N186 End stage renal disease: Secondary | ICD-10-CM | POA: Diagnosis not present

## 2016-05-20 DIAGNOSIS — M6281 Muscle weakness (generalized): Secondary | ICD-10-CM | POA: Diagnosis not present

## 2016-05-21 DIAGNOSIS — N186 End stage renal disease: Secondary | ICD-10-CM | POA: Diagnosis not present

## 2016-05-21 DIAGNOSIS — Z992 Dependence on renal dialysis: Secondary | ICD-10-CM | POA: Diagnosis not present

## 2016-05-21 DIAGNOSIS — D509 Iron deficiency anemia, unspecified: Secondary | ICD-10-CM | POA: Diagnosis not present

## 2016-05-21 DIAGNOSIS — D631 Anemia in chronic kidney disease: Secondary | ICD-10-CM | POA: Diagnosis not present

## 2016-05-21 DIAGNOSIS — N2581 Secondary hyperparathyroidism of renal origin: Secondary | ICD-10-CM | POA: Diagnosis not present

## 2016-05-23 ENCOUNTER — Ambulatory Visit (INDEPENDENT_AMBULATORY_CARE_PROVIDER_SITE_OTHER): Payer: Self-pay | Admitting: Vascular Surgery

## 2016-05-23 ENCOUNTER — Encounter: Payer: Self-pay | Admitting: Vascular Surgery

## 2016-05-23 VITALS — BP 113/67 | HR 72 | Temp 98.0°F | Resp 16 | Ht 74.0 in | Wt 192.9 lb

## 2016-05-23 DIAGNOSIS — M6281 Muscle weakness (generalized): Secondary | ICD-10-CM | POA: Diagnosis not present

## 2016-05-23 DIAGNOSIS — I4891 Unspecified atrial fibrillation: Secondary | ICD-10-CM | POA: Diagnosis not present

## 2016-05-23 DIAGNOSIS — I120 Hypertensive chronic kidney disease with stage 5 chronic kidney disease or end stage renal disease: Secondary | ICD-10-CM | POA: Diagnosis not present

## 2016-05-23 DIAGNOSIS — N186 End stage renal disease: Secondary | ICD-10-CM | POA: Diagnosis not present

## 2016-05-23 DIAGNOSIS — Z992 Dependence on renal dialysis: Secondary | ICD-10-CM

## 2016-05-23 DIAGNOSIS — T8189XD Other complications of procedures, not elsewhere classified, subsequent encounter: Secondary | ICD-10-CM | POA: Diagnosis not present

## 2016-05-23 NOTE — Progress Notes (Signed)
    Postoperative Access Visit   History of Present Illness  Corey [name] is a 46 y.o. (1970/07/15) male who presents for postoperative follow-up for: debridement of LUA, ligation of fistula for calciphylaxis.[name]  He is getting M-W-F Aquacel Ag dressing changes.  The patient notes L hand para/anesthesia continues to improve.  Pt has follow with Dr. Leland Johns tomorrow.  For VQI Use Only  PRE-ADM LIVING: Home  AMB STATUS: Wheelchair   Physical Examination Vitals:   05/23/16 1218  BP: 113/67  Pulse: 72  Resp: 16  Temp: 98 F (36.7 C)  TempSrc: Oral  SpO2: 97%  Weight: 192 lb 14.4 oz (87.5 kg)  Height: 6\' 2"  (1.88 m)    LUE: strongly palpable radial pulse, L hand grip 4-5/5, sensation intact, L upper arm wound: clean, subcutaneous tissue appears well healed and well vascularization, re-epithelization appears evident on edges, prior fistula incision exposure appears healed  Medical Decision Making  Corey [name] is a 47 y.o. (1970/10/26) male who presents s/p LUA debridement for calciphylaxis, ligation of L BVT.   The wound appears to be healing well.  Would be interested in Dr. Leland Johns opinion whether or not graft even needed at this point.  The patient healed his fistula exposure incision, so I suspect he will likely heal his STSG harvest site, but not certain he needs a STSG.  Not certain of any biosynthetic graft might speed the process.  Obviously calciphylaxis increases his risk of wound complication, so the patient is considering his options.  Will check on wound in 2 weeks.  Thank you for allowing [place] to participate in this patient's care.  [name], MD, FACS [facility] [place] Office: [phone] Pager: [phone]

## 2016-05-24 DIAGNOSIS — D631 Anemia in chronic kidney disease: Secondary | ICD-10-CM | POA: Diagnosis not present

## 2016-05-24 DIAGNOSIS — N2581 Secondary hyperparathyroidism of renal origin: Secondary | ICD-10-CM | POA: Diagnosis not present

## 2016-05-24 DIAGNOSIS — Z992 Dependence on renal dialysis: Secondary | ICD-10-CM | POA: Diagnosis not present

## 2016-05-24 DIAGNOSIS — N186 End stage renal disease: Secondary | ICD-10-CM | POA: Diagnosis not present

## 2016-05-24 DIAGNOSIS — D509 Iron deficiency anemia, unspecified: Secondary | ICD-10-CM | POA: Diagnosis not present

## 2016-05-25 DIAGNOSIS — M6281 Muscle weakness (generalized): Secondary | ICD-10-CM | POA: Diagnosis not present

## 2016-05-25 DIAGNOSIS — S41102D Unspecified open wound of left upper arm, subsequent encounter: Secondary | ICD-10-CM | POA: Diagnosis not present

## 2016-05-25 DIAGNOSIS — I4891 Unspecified atrial fibrillation: Secondary | ICD-10-CM | POA: Diagnosis not present

## 2016-05-25 DIAGNOSIS — I120 Hypertensive chronic kidney disease with stage 5 chronic kidney disease or end stage renal disease: Secondary | ICD-10-CM | POA: Diagnosis not present

## 2016-05-25 DIAGNOSIS — N186 End stage renal disease: Secondary | ICD-10-CM | POA: Diagnosis not present

## 2016-05-25 DIAGNOSIS — Z7901 Long term (current) use of anticoagulants: Secondary | ICD-10-CM | POA: Diagnosis not present

## 2016-05-25 DIAGNOSIS — T8189XD Other complications of procedures, not elsewhere classified, subsequent encounter: Secondary | ICD-10-CM | POA: Diagnosis not present

## 2016-05-25 DIAGNOSIS — Z992 Dependence on renal dialysis: Secondary | ICD-10-CM | POA: Diagnosis not present

## 2016-05-27 ENCOUNTER — Other Ambulatory Visit: Payer: Self-pay

## 2016-05-27 ENCOUNTER — Ambulatory Visit (INDEPENDENT_AMBULATORY_CARE_PROVIDER_SITE_OTHER): Payer: Medicare Other | Admitting: Internal Medicine

## 2016-05-27 ENCOUNTER — Other Ambulatory Visit: Payer: Self-pay | Admitting: Cardiology

## 2016-05-27 DIAGNOSIS — N186 End stage renal disease: Secondary | ICD-10-CM | POA: Diagnosis not present

## 2016-05-27 DIAGNOSIS — I4891 Unspecified atrial fibrillation: Secondary | ICD-10-CM

## 2016-05-27 DIAGNOSIS — Z992 Dependence on renal dialysis: Secondary | ICD-10-CM | POA: Diagnosis not present

## 2016-05-27 DIAGNOSIS — M6281 Muscle weakness (generalized): Secondary | ICD-10-CM | POA: Diagnosis not present

## 2016-05-27 DIAGNOSIS — T8189XD Other complications of procedures, not elsewhere classified, subsequent encounter: Secondary | ICD-10-CM | POA: Diagnosis not present

## 2016-05-27 DIAGNOSIS — I120 Hypertensive chronic kidney disease with stage 5 chronic kidney disease or end stage renal disease: Secondary | ICD-10-CM | POA: Diagnosis not present

## 2016-05-27 LAB — POCT INR: INR: 1.3

## 2016-05-28 DIAGNOSIS — D509 Iron deficiency anemia, unspecified: Secondary | ICD-10-CM | POA: Diagnosis not present

## 2016-05-28 DIAGNOSIS — I482 Chronic atrial fibrillation: Secondary | ICD-10-CM | POA: Diagnosis not present

## 2016-05-28 DIAGNOSIS — N2581 Secondary hyperparathyroidism of renal origin: Secondary | ICD-10-CM | POA: Diagnosis not present

## 2016-05-28 DIAGNOSIS — N186 End stage renal disease: Secondary | ICD-10-CM | POA: Diagnosis not present

## 2016-05-28 DIAGNOSIS — Z992 Dependence on renal dialysis: Secondary | ICD-10-CM | POA: Diagnosis not present

## 2016-05-28 DIAGNOSIS — D631 Anemia in chronic kidney disease: Secondary | ICD-10-CM | POA: Diagnosis not present

## 2016-05-30 DIAGNOSIS — Z992 Dependence on renal dialysis: Secondary | ICD-10-CM | POA: Diagnosis not present

## 2016-05-30 DIAGNOSIS — I120 Hypertensive chronic kidney disease with stage 5 chronic kidney disease or end stage renal disease: Secondary | ICD-10-CM | POA: Diagnosis not present

## 2016-05-30 DIAGNOSIS — I4891 Unspecified atrial fibrillation: Secondary | ICD-10-CM | POA: Diagnosis not present

## 2016-05-30 DIAGNOSIS — N186 End stage renal disease: Secondary | ICD-10-CM | POA: Diagnosis not present

## 2016-05-30 DIAGNOSIS — T8189XD Other complications of procedures, not elsewhere classified, subsequent encounter: Secondary | ICD-10-CM | POA: Diagnosis not present

## 2016-05-30 DIAGNOSIS — M6281 Muscle weakness (generalized): Secondary | ICD-10-CM | POA: Diagnosis not present

## 2016-05-31 DIAGNOSIS — Z992 Dependence on renal dialysis: Secondary | ICD-10-CM | POA: Diagnosis not present

## 2016-05-31 DIAGNOSIS — N186 End stage renal disease: Secondary | ICD-10-CM | POA: Diagnosis not present

## 2016-05-31 DIAGNOSIS — D631 Anemia in chronic kidney disease: Secondary | ICD-10-CM | POA: Diagnosis not present

## 2016-05-31 DIAGNOSIS — D509 Iron deficiency anemia, unspecified: Secondary | ICD-10-CM | POA: Diagnosis not present

## 2016-05-31 DIAGNOSIS — N2581 Secondary hyperparathyroidism of renal origin: Secondary | ICD-10-CM | POA: Diagnosis not present

## 2016-06-01 DIAGNOSIS — I129 Hypertensive chronic kidney disease with stage 1 through stage 4 chronic kidney disease, or unspecified chronic kidney disease: Secondary | ICD-10-CM | POA: Diagnosis not present

## 2016-06-01 DIAGNOSIS — Z992 Dependence on renal dialysis: Secondary | ICD-10-CM | POA: Diagnosis not present

## 2016-06-01 DIAGNOSIS — N186 End stage renal disease: Secondary | ICD-10-CM | POA: Diagnosis not present

## 2016-06-02 DIAGNOSIS — Z992 Dependence on renal dialysis: Secondary | ICD-10-CM | POA: Diagnosis not present

## 2016-06-02 DIAGNOSIS — N2581 Secondary hyperparathyroidism of renal origin: Secondary | ICD-10-CM | POA: Diagnosis not present

## 2016-06-02 DIAGNOSIS — N186 End stage renal disease: Secondary | ICD-10-CM | POA: Diagnosis not present

## 2016-06-02 DIAGNOSIS — D631 Anemia in chronic kidney disease: Secondary | ICD-10-CM | POA: Diagnosis not present

## 2016-06-03 ENCOUNTER — Ambulatory Visit (INDEPENDENT_AMBULATORY_CARE_PROVIDER_SITE_OTHER): Payer: Medicare Other

## 2016-06-03 ENCOUNTER — Encounter: Payer: Self-pay | Admitting: Vascular Surgery

## 2016-06-03 DIAGNOSIS — M6281 Muscle weakness (generalized): Secondary | ICD-10-CM | POA: Diagnosis not present

## 2016-06-03 DIAGNOSIS — T8189XD Other complications of procedures, not elsewhere classified, subsequent encounter: Secondary | ICD-10-CM | POA: Diagnosis not present

## 2016-06-03 DIAGNOSIS — I4891 Unspecified atrial fibrillation: Secondary | ICD-10-CM

## 2016-06-03 DIAGNOSIS — I120 Hypertensive chronic kidney disease with stage 5 chronic kidney disease or end stage renal disease: Secondary | ICD-10-CM | POA: Diagnosis not present

## 2016-06-03 DIAGNOSIS — N186 End stage renal disease: Secondary | ICD-10-CM | POA: Diagnosis not present

## 2016-06-03 DIAGNOSIS — Z992 Dependence on renal dialysis: Secondary | ICD-10-CM | POA: Diagnosis not present

## 2016-06-03 LAB — POCT INR: INR: 2.4

## 2016-06-04 DIAGNOSIS — N186 End stage renal disease: Secondary | ICD-10-CM | POA: Diagnosis not present

## 2016-06-04 DIAGNOSIS — D631 Anemia in chronic kidney disease: Secondary | ICD-10-CM | POA: Diagnosis not present

## 2016-06-04 DIAGNOSIS — N2581 Secondary hyperparathyroidism of renal origin: Secondary | ICD-10-CM | POA: Diagnosis not present

## 2016-06-04 DIAGNOSIS — Z992 Dependence on renal dialysis: Secondary | ICD-10-CM | POA: Diagnosis not present

## 2016-06-06 DIAGNOSIS — N186 End stage renal disease: Secondary | ICD-10-CM | POA: Diagnosis not present

## 2016-06-06 DIAGNOSIS — I120 Hypertensive chronic kidney disease with stage 5 chronic kidney disease or end stage renal disease: Secondary | ICD-10-CM | POA: Diagnosis not present

## 2016-06-06 DIAGNOSIS — M6281 Muscle weakness (generalized): Secondary | ICD-10-CM | POA: Diagnosis not present

## 2016-06-06 DIAGNOSIS — T8189XD Other complications of procedures, not elsewhere classified, subsequent encounter: Secondary | ICD-10-CM | POA: Diagnosis not present

## 2016-06-06 DIAGNOSIS — Z992 Dependence on renal dialysis: Secondary | ICD-10-CM | POA: Diagnosis not present

## 2016-06-06 DIAGNOSIS — I4891 Unspecified atrial fibrillation: Secondary | ICD-10-CM | POA: Diagnosis not present

## 2016-06-06 NOTE — Progress Notes (Signed)
    Postoperative Access Visit   History of Present Illness  Corey [name] is a 46 y.o. ([birth year]) male who presents for postoperative follow-up for: debridement of LUA, ligation of fistula for calciphylaxis.[name] He is getting M-W-F Aquacel Ag dressing changes. The patient notes L hand para/anesthesia continues to improve. Dr. Leland Johns has recommended: no further intervention  For VQI Use Only  PRE-ADM LIVING: Home  AMB STATUS: Wheelchair   Physical Examination  Vitals:   06/10/16 1055  BP: (!) 150/90  Pulse: 85  Resp: 16  Temp: 97.8 F (36.6 C)  TempSrc: Oral  SpO2: 91%  Weight: 192 lb (87.1 kg)  Height: 6\' 2"  (1.88 m)    LUE: strongly palpable radial pulse, L hand grip 4-5/5, sensation intact, L upper arm wound: clean,>50% wound contraction  Medical Decision Making  Corey [name] is a 46 y.o. (1970/06/28) male who presents s/p LUA debridement for calciphylaxis, ligation of L BVT.   F/U 4 weeks to re-evaluation LUA  If healed in 4 weeks will proceed with R arm access.  [name], MD, FACS [facility] [place] Office: [phone] Pager: [phone]

## 2016-06-07 DIAGNOSIS — N2581 Secondary hyperparathyroidism of renal origin: Secondary | ICD-10-CM | POA: Diagnosis not present

## 2016-06-07 DIAGNOSIS — N186 End stage renal disease: Secondary | ICD-10-CM | POA: Diagnosis not present

## 2016-06-07 DIAGNOSIS — D631 Anemia in chronic kidney disease: Secondary | ICD-10-CM | POA: Diagnosis not present

## 2016-06-07 DIAGNOSIS — Z992 Dependence on renal dialysis: Secondary | ICD-10-CM | POA: Diagnosis not present

## 2016-06-08 DIAGNOSIS — I4891 Unspecified atrial fibrillation: Secondary | ICD-10-CM | POA: Diagnosis not present

## 2016-06-08 DIAGNOSIS — I120 Hypertensive chronic kidney disease with stage 5 chronic kidney disease or end stage renal disease: Secondary | ICD-10-CM | POA: Diagnosis not present

## 2016-06-08 DIAGNOSIS — Z992 Dependence on renal dialysis: Secondary | ICD-10-CM | POA: Diagnosis not present

## 2016-06-08 DIAGNOSIS — T8189XD Other complications of procedures, not elsewhere classified, subsequent encounter: Secondary | ICD-10-CM | POA: Diagnosis not present

## 2016-06-08 DIAGNOSIS — N186 End stage renal disease: Secondary | ICD-10-CM | POA: Diagnosis not present

## 2016-06-08 DIAGNOSIS — M6281 Muscle weakness (generalized): Secondary | ICD-10-CM | POA: Diagnosis not present

## 2016-06-10 ENCOUNTER — Encounter: Payer: Self-pay | Admitting: Vascular Surgery

## 2016-06-10 ENCOUNTER — Ambulatory Visit (INDEPENDENT_AMBULATORY_CARE_PROVIDER_SITE_OTHER): Payer: Medicare Other | Admitting: Vascular Surgery

## 2016-06-10 ENCOUNTER — Ambulatory Visit (INDEPENDENT_AMBULATORY_CARE_PROVIDER_SITE_OTHER): Payer: Medicare Other

## 2016-06-10 VITALS — BP 150/90 | HR 85 | Temp 97.8°F | Resp 16 | Ht 74.0 in | Wt 192.0 lb

## 2016-06-10 DIAGNOSIS — N186 End stage renal disease: Secondary | ICD-10-CM | POA: Diagnosis not present

## 2016-06-10 DIAGNOSIS — Z992 Dependence on renal dialysis: Secondary | ICD-10-CM

## 2016-06-10 DIAGNOSIS — I4891 Unspecified atrial fibrillation: Secondary | ICD-10-CM

## 2016-06-10 DIAGNOSIS — M6281 Muscle weakness (generalized): Secondary | ICD-10-CM | POA: Diagnosis not present

## 2016-06-10 DIAGNOSIS — T8189XD Other complications of procedures, not elsewhere classified, subsequent encounter: Secondary | ICD-10-CM | POA: Diagnosis not present

## 2016-06-10 DIAGNOSIS — I120 Hypertensive chronic kidney disease with stage 5 chronic kidney disease or end stage renal disease: Secondary | ICD-10-CM | POA: Diagnosis not present

## 2016-06-10 LAB — POCT INR: INR: 4.2

## 2016-06-11 DIAGNOSIS — N2581 Secondary hyperparathyroidism of renal origin: Secondary | ICD-10-CM | POA: Diagnosis not present

## 2016-06-11 DIAGNOSIS — N186 End stage renal disease: Secondary | ICD-10-CM | POA: Diagnosis not present

## 2016-06-11 DIAGNOSIS — Z992 Dependence on renal dialysis: Secondary | ICD-10-CM | POA: Diagnosis not present

## 2016-06-11 DIAGNOSIS — D631 Anemia in chronic kidney disease: Secondary | ICD-10-CM | POA: Diagnosis not present

## 2016-06-13 DIAGNOSIS — I4891 Unspecified atrial fibrillation: Secondary | ICD-10-CM | POA: Diagnosis not present

## 2016-06-13 DIAGNOSIS — Z992 Dependence on renal dialysis: Secondary | ICD-10-CM | POA: Diagnosis not present

## 2016-06-13 DIAGNOSIS — T8189XD Other complications of procedures, not elsewhere classified, subsequent encounter: Secondary | ICD-10-CM | POA: Diagnosis not present

## 2016-06-13 DIAGNOSIS — N186 End stage renal disease: Secondary | ICD-10-CM | POA: Diagnosis not present

## 2016-06-13 DIAGNOSIS — M6281 Muscle weakness (generalized): Secondary | ICD-10-CM | POA: Diagnosis not present

## 2016-06-13 DIAGNOSIS — I120 Hypertensive chronic kidney disease with stage 5 chronic kidney disease or end stage renal disease: Secondary | ICD-10-CM | POA: Diagnosis not present

## 2016-06-14 ENCOUNTER — Encounter: Payer: Self-pay | Admitting: Nephrology

## 2016-06-15 ENCOUNTER — Ambulatory Visit (INDEPENDENT_AMBULATORY_CARE_PROVIDER_SITE_OTHER): Payer: Medicare Other | Admitting: Cardiology

## 2016-06-15 DIAGNOSIS — T8189XD Other complications of procedures, not elsewhere classified, subsequent encounter: Secondary | ICD-10-CM | POA: Diagnosis not present

## 2016-06-15 DIAGNOSIS — N186 End stage renal disease: Secondary | ICD-10-CM | POA: Diagnosis not present

## 2016-06-15 DIAGNOSIS — Z992 Dependence on renal dialysis: Secondary | ICD-10-CM | POA: Diagnosis not present

## 2016-06-15 DIAGNOSIS — I4891 Unspecified atrial fibrillation: Secondary | ICD-10-CM | POA: Diagnosis not present

## 2016-06-15 DIAGNOSIS — M6281 Muscle weakness (generalized): Secondary | ICD-10-CM | POA: Diagnosis not present

## 2016-06-15 DIAGNOSIS — I120 Hypertensive chronic kidney disease with stage 5 chronic kidney disease or end stage renal disease: Secondary | ICD-10-CM | POA: Diagnosis not present

## 2016-06-15 LAB — POCT INR: INR: 4.5

## 2016-06-16 DIAGNOSIS — N186 End stage renal disease: Secondary | ICD-10-CM | POA: Diagnosis not present

## 2016-06-16 DIAGNOSIS — Z992 Dependence on renal dialysis: Secondary | ICD-10-CM | POA: Diagnosis not present

## 2016-06-16 DIAGNOSIS — D631 Anemia in chronic kidney disease: Secondary | ICD-10-CM | POA: Diagnosis not present

## 2016-06-16 DIAGNOSIS — N2581 Secondary hyperparathyroidism of renal origin: Secondary | ICD-10-CM | POA: Diagnosis not present

## 2016-06-18 DIAGNOSIS — N2581 Secondary hyperparathyroidism of renal origin: Secondary | ICD-10-CM | POA: Diagnosis not present

## 2016-06-18 DIAGNOSIS — N186 End stage renal disease: Secondary | ICD-10-CM | POA: Diagnosis not present

## 2016-06-18 DIAGNOSIS — Z992 Dependence on renal dialysis: Secondary | ICD-10-CM | POA: Diagnosis not present

## 2016-06-18 DIAGNOSIS — D631 Anemia in chronic kidney disease: Secondary | ICD-10-CM | POA: Diagnosis not present

## 2016-06-20 DIAGNOSIS — M6281 Muscle weakness (generalized): Secondary | ICD-10-CM | POA: Diagnosis not present

## 2016-06-20 DIAGNOSIS — N186 End stage renal disease: Secondary | ICD-10-CM | POA: Diagnosis not present

## 2016-06-20 DIAGNOSIS — I4891 Unspecified atrial fibrillation: Secondary | ICD-10-CM | POA: Diagnosis not present

## 2016-06-20 DIAGNOSIS — Z992 Dependence on renal dialysis: Secondary | ICD-10-CM | POA: Diagnosis not present

## 2016-06-20 DIAGNOSIS — G8220 Paraplegia, unspecified: Secondary | ICD-10-CM | POA: Diagnosis not present

## 2016-06-20 DIAGNOSIS — T8189XD Other complications of procedures, not elsewhere classified, subsequent encounter: Secondary | ICD-10-CM | POA: Diagnosis not present

## 2016-06-20 DIAGNOSIS — Z7901 Long term (current) use of anticoagulants: Secondary | ICD-10-CM | POA: Diagnosis not present

## 2016-06-20 DIAGNOSIS — I120 Hypertensive chronic kidney disease with stage 5 chronic kidney disease or end stage renal disease: Secondary | ICD-10-CM | POA: Diagnosis not present

## 2016-06-20 DIAGNOSIS — T82898S Other specified complication of vascular prosthetic devices, implants and grafts, sequela: Secondary | ICD-10-CM | POA: Diagnosis not present

## 2016-06-20 DIAGNOSIS — S41102D Unspecified open wound of left upper arm, subsequent encounter: Secondary | ICD-10-CM | POA: Diagnosis not present

## 2016-06-21 DIAGNOSIS — Z992 Dependence on renal dialysis: Secondary | ICD-10-CM | POA: Diagnosis not present

## 2016-06-21 DIAGNOSIS — N2581 Secondary hyperparathyroidism of renal origin: Secondary | ICD-10-CM | POA: Diagnosis not present

## 2016-06-21 DIAGNOSIS — D631 Anemia in chronic kidney disease: Secondary | ICD-10-CM | POA: Diagnosis not present

## 2016-06-21 DIAGNOSIS — N186 End stage renal disease: Secondary | ICD-10-CM | POA: Diagnosis not present

## 2016-06-22 DIAGNOSIS — I120 Hypertensive chronic kidney disease with stage 5 chronic kidney disease or end stage renal disease: Secondary | ICD-10-CM | POA: Diagnosis not present

## 2016-06-22 DIAGNOSIS — M6281 Muscle weakness (generalized): Secondary | ICD-10-CM | POA: Diagnosis not present

## 2016-06-22 DIAGNOSIS — T8189XD Other complications of procedures, not elsewhere classified, subsequent encounter: Secondary | ICD-10-CM | POA: Diagnosis not present

## 2016-06-22 DIAGNOSIS — I4891 Unspecified atrial fibrillation: Secondary | ICD-10-CM | POA: Diagnosis not present

## 2016-06-22 DIAGNOSIS — N186 End stage renal disease: Secondary | ICD-10-CM | POA: Diagnosis not present

## 2016-06-22 DIAGNOSIS — Z992 Dependence on renal dialysis: Secondary | ICD-10-CM | POA: Diagnosis not present

## 2016-06-23 ENCOUNTER — Ambulatory Visit (INDEPENDENT_AMBULATORY_CARE_PROVIDER_SITE_OTHER): Payer: Medicare Other | Admitting: Cardiology

## 2016-06-23 DIAGNOSIS — M6281 Muscle weakness (generalized): Secondary | ICD-10-CM | POA: Diagnosis not present

## 2016-06-23 DIAGNOSIS — N2581 Secondary hyperparathyroidism of renal origin: Secondary | ICD-10-CM | POA: Diagnosis not present

## 2016-06-23 DIAGNOSIS — N186 End stage renal disease: Secondary | ICD-10-CM | POA: Diagnosis not present

## 2016-06-23 DIAGNOSIS — Z992 Dependence on renal dialysis: Secondary | ICD-10-CM | POA: Diagnosis not present

## 2016-06-23 DIAGNOSIS — I120 Hypertensive chronic kidney disease with stage 5 chronic kidney disease or end stage renal disease: Secondary | ICD-10-CM | POA: Diagnosis not present

## 2016-06-23 DIAGNOSIS — D631 Anemia in chronic kidney disease: Secondary | ICD-10-CM | POA: Diagnosis not present

## 2016-06-23 DIAGNOSIS — T8189XD Other complications of procedures, not elsewhere classified, subsequent encounter: Secondary | ICD-10-CM | POA: Diagnosis not present

## 2016-06-23 DIAGNOSIS — I4891 Unspecified atrial fibrillation: Secondary | ICD-10-CM | POA: Diagnosis not present

## 2016-06-23 LAB — POCT INR: INR: 2.2

## 2016-06-25 DIAGNOSIS — Z992 Dependence on renal dialysis: Secondary | ICD-10-CM | POA: Diagnosis not present

## 2016-06-25 DIAGNOSIS — N186 End stage renal disease: Secondary | ICD-10-CM | POA: Diagnosis not present

## 2016-06-25 DIAGNOSIS — N2581 Secondary hyperparathyroidism of renal origin: Secondary | ICD-10-CM | POA: Diagnosis not present

## 2016-06-25 DIAGNOSIS — D631 Anemia in chronic kidney disease: Secondary | ICD-10-CM | POA: Diagnosis not present

## 2016-06-27 DIAGNOSIS — N186 End stage renal disease: Secondary | ICD-10-CM | POA: Diagnosis not present

## 2016-06-27 DIAGNOSIS — I4891 Unspecified atrial fibrillation: Secondary | ICD-10-CM | POA: Diagnosis not present

## 2016-06-27 DIAGNOSIS — M6281 Muscle weakness (generalized): Secondary | ICD-10-CM | POA: Diagnosis not present

## 2016-06-27 DIAGNOSIS — T8189XD Other complications of procedures, not elsewhere classified, subsequent encounter: Secondary | ICD-10-CM | POA: Diagnosis not present

## 2016-06-27 DIAGNOSIS — Z992 Dependence on renal dialysis: Secondary | ICD-10-CM | POA: Diagnosis not present

## 2016-06-27 DIAGNOSIS — I120 Hypertensive chronic kidney disease with stage 5 chronic kidney disease or end stage renal disease: Secondary | ICD-10-CM | POA: Diagnosis not present

## 2016-06-28 DIAGNOSIS — N186 End stage renal disease: Secondary | ICD-10-CM | POA: Diagnosis not present

## 2016-06-28 DIAGNOSIS — N2581 Secondary hyperparathyroidism of renal origin: Secondary | ICD-10-CM | POA: Diagnosis not present

## 2016-06-28 DIAGNOSIS — D631 Anemia in chronic kidney disease: Secondary | ICD-10-CM | POA: Diagnosis not present

## 2016-06-28 DIAGNOSIS — Z992 Dependence on renal dialysis: Secondary | ICD-10-CM | POA: Diagnosis not present

## 2016-06-29 ENCOUNTER — Ambulatory Visit (INDEPENDENT_AMBULATORY_CARE_PROVIDER_SITE_OTHER): Payer: Medicare Other | Admitting: Internal Medicine

## 2016-06-29 ENCOUNTER — Other Ambulatory Visit: Payer: Self-pay | Admitting: *Deleted

## 2016-06-29 DIAGNOSIS — I4891 Unspecified atrial fibrillation: Secondary | ICD-10-CM

## 2016-06-29 DIAGNOSIS — M6281 Muscle weakness (generalized): Secondary | ICD-10-CM | POA: Diagnosis not present

## 2016-06-29 DIAGNOSIS — T8189XD Other complications of procedures, not elsewhere classified, subsequent encounter: Secondary | ICD-10-CM | POA: Diagnosis not present

## 2016-06-29 DIAGNOSIS — N186 End stage renal disease: Secondary | ICD-10-CM | POA: Diagnosis not present

## 2016-06-29 DIAGNOSIS — Z992 Dependence on renal dialysis: Secondary | ICD-10-CM | POA: Diagnosis not present

## 2016-06-29 DIAGNOSIS — I120 Hypertensive chronic kidney disease with stage 5 chronic kidney disease or end stage renal disease: Secondary | ICD-10-CM | POA: Diagnosis not present

## 2016-06-29 LAB — POCT INR: INR: 2.7

## 2016-06-30 MED ORDER — WARFARIN SODIUM 5 MG PO TABS: ORAL_TABLET | ORAL | 3 refills | Status: DC

## 2016-07-01 DIAGNOSIS — I120 Hypertensive chronic kidney disease with stage 5 chronic kidney disease or end stage renal disease: Secondary | ICD-10-CM | POA: Diagnosis not present

## 2016-07-01 DIAGNOSIS — N186 End stage renal disease: Secondary | ICD-10-CM | POA: Diagnosis not present

## 2016-07-01 DIAGNOSIS — I4891 Unspecified atrial fibrillation: Secondary | ICD-10-CM | POA: Diagnosis not present

## 2016-07-01 DIAGNOSIS — T8189XD Other complications of procedures, not elsewhere classified, subsequent encounter: Secondary | ICD-10-CM | POA: Diagnosis not present

## 2016-07-01 DIAGNOSIS — Z992 Dependence on renal dialysis: Secondary | ICD-10-CM | POA: Diagnosis not present

## 2016-07-01 DIAGNOSIS — M6281 Muscle weakness (generalized): Secondary | ICD-10-CM | POA: Diagnosis not present

## 2016-07-02 DIAGNOSIS — Z992 Dependence on renal dialysis: Secondary | ICD-10-CM | POA: Diagnosis not present

## 2016-07-02 DIAGNOSIS — I482 Chronic atrial fibrillation: Secondary | ICD-10-CM | POA: Diagnosis not present

## 2016-07-02 DIAGNOSIS — N186 End stage renal disease: Secondary | ICD-10-CM | POA: Diagnosis not present

## 2016-07-02 DIAGNOSIS — D631 Anemia in chronic kidney disease: Secondary | ICD-10-CM | POA: Diagnosis not present

## 2016-07-02 DIAGNOSIS — I129 Hypertensive chronic kidney disease with stage 1 through stage 4 chronic kidney disease, or unspecified chronic kidney disease: Secondary | ICD-10-CM | POA: Diagnosis not present

## 2016-07-02 DIAGNOSIS — N2581 Secondary hyperparathyroidism of renal origin: Secondary | ICD-10-CM | POA: Diagnosis not present

## 2016-07-04 DIAGNOSIS — I120 Hypertensive chronic kidney disease with stage 5 chronic kidney disease or end stage renal disease: Secondary | ICD-10-CM | POA: Diagnosis not present

## 2016-07-04 DIAGNOSIS — T8189XD Other complications of procedures, not elsewhere classified, subsequent encounter: Secondary | ICD-10-CM | POA: Diagnosis not present

## 2016-07-04 DIAGNOSIS — D631 Anemia in chronic kidney disease: Secondary | ICD-10-CM | POA: Diagnosis not present

## 2016-07-04 DIAGNOSIS — N186 End stage renal disease: Secondary | ICD-10-CM | POA: Diagnosis not present

## 2016-07-04 DIAGNOSIS — N2581 Secondary hyperparathyroidism of renal origin: Secondary | ICD-10-CM | POA: Diagnosis not present

## 2016-07-04 DIAGNOSIS — I4891 Unspecified atrial fibrillation: Secondary | ICD-10-CM | POA: Diagnosis not present

## 2016-07-04 DIAGNOSIS — M6281 Muscle weakness (generalized): Secondary | ICD-10-CM | POA: Diagnosis not present

## 2016-07-04 DIAGNOSIS — Z992 Dependence on renal dialysis: Secondary | ICD-10-CM | POA: Diagnosis not present

## 2016-07-05 DIAGNOSIS — D631 Anemia in chronic kidney disease: Secondary | ICD-10-CM | POA: Diagnosis not present

## 2016-07-05 DIAGNOSIS — N2581 Secondary hyperparathyroidism of renal origin: Secondary | ICD-10-CM | POA: Diagnosis not present

## 2016-07-05 DIAGNOSIS — Z992 Dependence on renal dialysis: Secondary | ICD-10-CM | POA: Diagnosis not present

## 2016-07-05 DIAGNOSIS — N186 End stage renal disease: Secondary | ICD-10-CM | POA: Diagnosis not present

## 2016-07-06 DIAGNOSIS — I4891 Unspecified atrial fibrillation: Secondary | ICD-10-CM | POA: Diagnosis not present

## 2016-07-06 DIAGNOSIS — Z992 Dependence on renal dialysis: Secondary | ICD-10-CM | POA: Diagnosis not present

## 2016-07-06 DIAGNOSIS — N186 End stage renal disease: Secondary | ICD-10-CM | POA: Diagnosis not present

## 2016-07-06 DIAGNOSIS — M6281 Muscle weakness (generalized): Secondary | ICD-10-CM | POA: Diagnosis not present

## 2016-07-06 DIAGNOSIS — I120 Hypertensive chronic kidney disease with stage 5 chronic kidney disease or end stage renal disease: Secondary | ICD-10-CM | POA: Diagnosis not present

## 2016-07-06 DIAGNOSIS — T8189XD Other complications of procedures, not elsewhere classified, subsequent encounter: Secondary | ICD-10-CM | POA: Diagnosis not present

## 2016-07-07 ENCOUNTER — Encounter: Payer: Self-pay | Admitting: Vascular Surgery

## 2016-07-07 DIAGNOSIS — N2581 Secondary hyperparathyroidism of renal origin: Secondary | ICD-10-CM | POA: Diagnosis not present

## 2016-07-07 DIAGNOSIS — D631 Anemia in chronic kidney disease: Secondary | ICD-10-CM | POA: Diagnosis not present

## 2016-07-07 DIAGNOSIS — N186 End stage renal disease: Secondary | ICD-10-CM | POA: Diagnosis not present

## 2016-07-07 DIAGNOSIS — Z992 Dependence on renal dialysis: Secondary | ICD-10-CM | POA: Diagnosis not present

## 2016-07-08 DIAGNOSIS — Z992 Dependence on renal dialysis: Secondary | ICD-10-CM | POA: Diagnosis not present

## 2016-07-08 DIAGNOSIS — T8189XD Other complications of procedures, not elsewhere classified, subsequent encounter: Secondary | ICD-10-CM | POA: Diagnosis not present

## 2016-07-08 DIAGNOSIS — N186 End stage renal disease: Secondary | ICD-10-CM | POA: Diagnosis not present

## 2016-07-08 DIAGNOSIS — I120 Hypertensive chronic kidney disease with stage 5 chronic kidney disease or end stage renal disease: Secondary | ICD-10-CM | POA: Diagnosis not present

## 2016-07-08 DIAGNOSIS — M6281 Muscle weakness (generalized): Secondary | ICD-10-CM | POA: Diagnosis not present

## 2016-07-08 DIAGNOSIS — I4891 Unspecified atrial fibrillation: Secondary | ICD-10-CM | POA: Diagnosis not present

## 2016-07-09 DIAGNOSIS — N2581 Secondary hyperparathyroidism of renal origin: Secondary | ICD-10-CM | POA: Diagnosis not present

## 2016-07-09 DIAGNOSIS — D631 Anemia in chronic kidney disease: Secondary | ICD-10-CM | POA: Diagnosis not present

## 2016-07-09 DIAGNOSIS — N186 End stage renal disease: Secondary | ICD-10-CM | POA: Diagnosis not present

## 2016-07-09 DIAGNOSIS — Z992 Dependence on renal dialysis: Secondary | ICD-10-CM | POA: Diagnosis not present

## 2016-07-11 DIAGNOSIS — M6281 Muscle weakness (generalized): Secondary | ICD-10-CM | POA: Diagnosis not present

## 2016-07-11 DIAGNOSIS — Z7901 Long term (current) use of anticoagulants: Secondary | ICD-10-CM | POA: Diagnosis not present

## 2016-07-11 DIAGNOSIS — G8220 Paraplegia, unspecified: Secondary | ICD-10-CM | POA: Diagnosis not present

## 2016-07-11 DIAGNOSIS — N186 End stage renal disease: Secondary | ICD-10-CM | POA: Diagnosis not present

## 2016-07-11 DIAGNOSIS — T8189XD Other complications of procedures, not elsewhere classified, subsequent encounter: Secondary | ICD-10-CM | POA: Diagnosis not present

## 2016-07-11 DIAGNOSIS — I120 Hypertensive chronic kidney disease with stage 5 chronic kidney disease or end stage renal disease: Secondary | ICD-10-CM | POA: Diagnosis not present

## 2016-07-11 DIAGNOSIS — I4891 Unspecified atrial fibrillation: Secondary | ICD-10-CM | POA: Diagnosis not present

## 2016-07-11 DIAGNOSIS — Z992 Dependence on renal dialysis: Secondary | ICD-10-CM | POA: Diagnosis not present

## 2016-07-12 DIAGNOSIS — N186 End stage renal disease: Secondary | ICD-10-CM | POA: Diagnosis not present

## 2016-07-12 DIAGNOSIS — D631 Anemia in chronic kidney disease: Secondary | ICD-10-CM | POA: Diagnosis not present

## 2016-07-12 DIAGNOSIS — Z992 Dependence on renal dialysis: Secondary | ICD-10-CM | POA: Diagnosis not present

## 2016-07-12 DIAGNOSIS — N2581 Secondary hyperparathyroidism of renal origin: Secondary | ICD-10-CM | POA: Diagnosis not present

## 2016-07-13 ENCOUNTER — Ambulatory Visit (INDEPENDENT_AMBULATORY_CARE_PROVIDER_SITE_OTHER): Payer: Self-pay | Admitting: Internal Medicine

## 2016-07-13 DIAGNOSIS — N186 End stage renal disease: Secondary | ICD-10-CM | POA: Diagnosis not present

## 2016-07-13 DIAGNOSIS — I4891 Unspecified atrial fibrillation: Secondary | ICD-10-CM | POA: Diagnosis not present

## 2016-07-13 DIAGNOSIS — M6281 Muscle weakness (generalized): Secondary | ICD-10-CM | POA: Diagnosis not present

## 2016-07-13 DIAGNOSIS — Z992 Dependence on renal dialysis: Secondary | ICD-10-CM | POA: Diagnosis not present

## 2016-07-13 DIAGNOSIS — I120 Hypertensive chronic kidney disease with stage 5 chronic kidney disease or end stage renal disease: Secondary | ICD-10-CM | POA: Diagnosis not present

## 2016-07-13 DIAGNOSIS — T8189XD Other complications of procedures, not elsewhere classified, subsequent encounter: Secondary | ICD-10-CM | POA: Diagnosis not present

## 2016-07-13 LAB — POCT INR: INR: 1.7

## 2016-07-14 NOTE — Progress Notes (Signed)
    Postoperative Access Visit   History of Present Illness  Demetries Coia is a 46 y.o. (1970/07/20) male who presents for postoperative follow-up for: debridement of LUA, ligation of fistula for calciphylaxis. The patient has healed at this point.  The patient notes L hand para/anesthesia continues to improve.    Physical Examination  Vitals:   07/15/16 1431  BP: 104/65  Pulse: 71  Resp: 20  Temp: 97.8 F (36.6 C)  TempSrc: Oral  SpO2: 99%  Weight: 192 lb (87.1 kg)  Height: 6\' 2"  (1.88 m)   LUE: strongly palpable radial pulse, L hand grip 4-5/5, sensation intact, L upper arm wound: healed, some residual scab   Medical Decision Making  Demarlo Riojas is a 46 y.o. (09/19/1970) male who presents s/p LUA debridement for calciphylaxis, ligation of L BVT.   Pt has no interest in pursuing permanent access at this point.  Given the severity of his calciphylaxis, I don't blame the patient.  We are available as needed in the event he changes his mind.   [name], MD, FACS [facility] [place] Office: [phone] Pager: [phone]

## 2016-07-15 ENCOUNTER — Encounter: Payer: Self-pay | Admitting: Vascular Surgery

## 2016-07-15 ENCOUNTER — Ambulatory Visit (INDEPENDENT_AMBULATORY_CARE_PROVIDER_SITE_OTHER): Payer: Self-pay | Admitting: Vascular Surgery

## 2016-07-15 DIAGNOSIS — N186 End stage renal disease: Secondary | ICD-10-CM

## 2016-07-15 DIAGNOSIS — Z992 Dependence on renal dialysis: Secondary | ICD-10-CM

## 2016-07-16 DIAGNOSIS — N186 End stage renal disease: Secondary | ICD-10-CM | POA: Diagnosis not present

## 2016-07-16 DIAGNOSIS — D631 Anemia in chronic kidney disease: Secondary | ICD-10-CM | POA: Diagnosis not present

## 2016-07-16 DIAGNOSIS — N2581 Secondary hyperparathyroidism of renal origin: Secondary | ICD-10-CM | POA: Diagnosis not present

## 2016-07-16 DIAGNOSIS — Z992 Dependence on renal dialysis: Secondary | ICD-10-CM | POA: Diagnosis not present

## 2016-07-18 DIAGNOSIS — I4891 Unspecified atrial fibrillation: Secondary | ICD-10-CM | POA: Diagnosis not present

## 2016-07-18 DIAGNOSIS — T8189XD Other complications of procedures, not elsewhere classified, subsequent encounter: Secondary | ICD-10-CM | POA: Diagnosis not present

## 2016-07-18 DIAGNOSIS — Z992 Dependence on renal dialysis: Secondary | ICD-10-CM | POA: Diagnosis not present

## 2016-07-18 DIAGNOSIS — I120 Hypertensive chronic kidney disease with stage 5 chronic kidney disease or end stage renal disease: Secondary | ICD-10-CM | POA: Diagnosis not present

## 2016-07-18 DIAGNOSIS — N186 End stage renal disease: Secondary | ICD-10-CM | POA: Diagnosis not present

## 2016-07-18 DIAGNOSIS — M6281 Muscle weakness (generalized): Secondary | ICD-10-CM | POA: Diagnosis not present

## 2016-07-19 DIAGNOSIS — N186 End stage renal disease: Secondary | ICD-10-CM | POA: Diagnosis not present

## 2016-07-19 DIAGNOSIS — Z992 Dependence on renal dialysis: Secondary | ICD-10-CM | POA: Diagnosis not present

## 2016-07-19 DIAGNOSIS — D631 Anemia in chronic kidney disease: Secondary | ICD-10-CM | POA: Diagnosis not present

## 2016-07-19 DIAGNOSIS — N2581 Secondary hyperparathyroidism of renal origin: Secondary | ICD-10-CM | POA: Diagnosis not present

## 2016-07-20 DIAGNOSIS — T8189XD Other complications of procedures, not elsewhere classified, subsequent encounter: Secondary | ICD-10-CM | POA: Diagnosis not present

## 2016-07-20 DIAGNOSIS — I120 Hypertensive chronic kidney disease with stage 5 chronic kidney disease or end stage renal disease: Secondary | ICD-10-CM | POA: Diagnosis not present

## 2016-07-20 DIAGNOSIS — N186 End stage renal disease: Secondary | ICD-10-CM | POA: Diagnosis not present

## 2016-07-20 DIAGNOSIS — M6281 Muscle weakness (generalized): Secondary | ICD-10-CM | POA: Diagnosis not present

## 2016-07-20 DIAGNOSIS — Z992 Dependence on renal dialysis: Secondary | ICD-10-CM | POA: Diagnosis not present

## 2016-07-20 DIAGNOSIS — I4891 Unspecified atrial fibrillation: Secondary | ICD-10-CM | POA: Diagnosis not present

## 2016-07-21 DIAGNOSIS — N2581 Secondary hyperparathyroidism of renal origin: Secondary | ICD-10-CM | POA: Diagnosis not present

## 2016-07-21 DIAGNOSIS — D631 Anemia in chronic kidney disease: Secondary | ICD-10-CM | POA: Diagnosis not present

## 2016-07-21 DIAGNOSIS — N186 End stage renal disease: Secondary | ICD-10-CM | POA: Diagnosis not present

## 2016-07-21 DIAGNOSIS — Z992 Dependence on renal dialysis: Secondary | ICD-10-CM | POA: Diagnosis not present

## 2016-07-23 DIAGNOSIS — N2581 Secondary hyperparathyroidism of renal origin: Secondary | ICD-10-CM | POA: Diagnosis not present

## 2016-07-23 DIAGNOSIS — N186 End stage renal disease: Secondary | ICD-10-CM | POA: Diagnosis not present

## 2016-07-23 DIAGNOSIS — D631 Anemia in chronic kidney disease: Secondary | ICD-10-CM | POA: Diagnosis not present

## 2016-07-23 DIAGNOSIS — Z992 Dependence on renal dialysis: Secondary | ICD-10-CM | POA: Diagnosis not present

## 2016-07-25 DIAGNOSIS — Z992 Dependence on renal dialysis: Secondary | ICD-10-CM | POA: Diagnosis not present

## 2016-07-25 DIAGNOSIS — M6281 Muscle weakness (generalized): Secondary | ICD-10-CM | POA: Diagnosis not present

## 2016-07-25 DIAGNOSIS — I120 Hypertensive chronic kidney disease with stage 5 chronic kidney disease or end stage renal disease: Secondary | ICD-10-CM | POA: Diagnosis not present

## 2016-07-25 DIAGNOSIS — N186 End stage renal disease: Secondary | ICD-10-CM | POA: Diagnosis not present

## 2016-07-25 DIAGNOSIS — I4891 Unspecified atrial fibrillation: Secondary | ICD-10-CM | POA: Diagnosis not present

## 2016-07-25 DIAGNOSIS — T8189XD Other complications of procedures, not elsewhere classified, subsequent encounter: Secondary | ICD-10-CM | POA: Diagnosis not present

## 2016-07-26 DIAGNOSIS — N186 End stage renal disease: Secondary | ICD-10-CM | POA: Diagnosis not present

## 2016-07-26 DIAGNOSIS — Z992 Dependence on renal dialysis: Secondary | ICD-10-CM | POA: Diagnosis not present

## 2016-07-26 DIAGNOSIS — N2581 Secondary hyperparathyroidism of renal origin: Secondary | ICD-10-CM | POA: Diagnosis not present

## 2016-07-26 DIAGNOSIS — D631 Anemia in chronic kidney disease: Secondary | ICD-10-CM | POA: Diagnosis not present

## 2016-07-27 ENCOUNTER — Ambulatory Visit (INDEPENDENT_AMBULATORY_CARE_PROVIDER_SITE_OTHER): Payer: Medicare Other | Admitting: Cardiology

## 2016-07-27 DIAGNOSIS — I120 Hypertensive chronic kidney disease with stage 5 chronic kidney disease or end stage renal disease: Secondary | ICD-10-CM | POA: Diagnosis not present

## 2016-07-27 DIAGNOSIS — I4891 Unspecified atrial fibrillation: Secondary | ICD-10-CM

## 2016-07-27 DIAGNOSIS — N186 End stage renal disease: Secondary | ICD-10-CM | POA: Diagnosis not present

## 2016-07-27 DIAGNOSIS — T8189XD Other complications of procedures, not elsewhere classified, subsequent encounter: Secondary | ICD-10-CM | POA: Diagnosis not present

## 2016-07-27 DIAGNOSIS — Z992 Dependence on renal dialysis: Secondary | ICD-10-CM | POA: Diagnosis not present

## 2016-07-27 DIAGNOSIS — M6281 Muscle weakness (generalized): Secondary | ICD-10-CM | POA: Diagnosis not present

## 2016-07-27 LAB — POCT INR: INR: 1.9

## 2016-07-30 DIAGNOSIS — N186 End stage renal disease: Secondary | ICD-10-CM | POA: Diagnosis not present

## 2016-07-30 DIAGNOSIS — N2581 Secondary hyperparathyroidism of renal origin: Secondary | ICD-10-CM | POA: Diagnosis not present

## 2016-07-30 DIAGNOSIS — D631 Anemia in chronic kidney disease: Secondary | ICD-10-CM | POA: Diagnosis not present

## 2016-07-30 DIAGNOSIS — Z992 Dependence on renal dialysis: Secondary | ICD-10-CM | POA: Diagnosis not present

## 2016-08-01 DIAGNOSIS — M6281 Muscle weakness (generalized): Secondary | ICD-10-CM | POA: Diagnosis not present

## 2016-08-01 DIAGNOSIS — N186 End stage renal disease: Secondary | ICD-10-CM | POA: Diagnosis not present

## 2016-08-01 DIAGNOSIS — I4891 Unspecified atrial fibrillation: Secondary | ICD-10-CM | POA: Diagnosis not present

## 2016-08-01 DIAGNOSIS — I129 Hypertensive chronic kidney disease with stage 1 through stage 4 chronic kidney disease, or unspecified chronic kidney disease: Secondary | ICD-10-CM | POA: Diagnosis not present

## 2016-08-01 DIAGNOSIS — I120 Hypertensive chronic kidney disease with stage 5 chronic kidney disease or end stage renal disease: Secondary | ICD-10-CM | POA: Diagnosis not present

## 2016-08-01 DIAGNOSIS — Z992 Dependence on renal dialysis: Secondary | ICD-10-CM | POA: Diagnosis not present

## 2016-08-01 DIAGNOSIS — T8189XD Other complications of procedures, not elsewhere classified, subsequent encounter: Secondary | ICD-10-CM | POA: Diagnosis not present

## 2016-08-02 DIAGNOSIS — Z992 Dependence on renal dialysis: Secondary | ICD-10-CM | POA: Diagnosis not present

## 2016-08-02 DIAGNOSIS — N186 End stage renal disease: Secondary | ICD-10-CM | POA: Diagnosis not present

## 2016-08-02 DIAGNOSIS — N2581 Secondary hyperparathyroidism of renal origin: Secondary | ICD-10-CM | POA: Diagnosis not present

## 2016-08-02 DIAGNOSIS — D631 Anemia in chronic kidney disease: Secondary | ICD-10-CM | POA: Diagnosis not present

## 2016-08-02 DIAGNOSIS — I959 Hypotension, unspecified: Secondary | ICD-10-CM | POA: Diagnosis not present

## 2016-08-06 DIAGNOSIS — N2581 Secondary hyperparathyroidism of renal origin: Secondary | ICD-10-CM | POA: Diagnosis not present

## 2016-08-06 DIAGNOSIS — D631 Anemia in chronic kidney disease: Secondary | ICD-10-CM | POA: Diagnosis not present

## 2016-08-06 DIAGNOSIS — Z992 Dependence on renal dialysis: Secondary | ICD-10-CM | POA: Diagnosis not present

## 2016-08-06 DIAGNOSIS — I959 Hypotension, unspecified: Secondary | ICD-10-CM | POA: Diagnosis not present

## 2016-08-06 DIAGNOSIS — N186 End stage renal disease: Secondary | ICD-10-CM | POA: Diagnosis not present

## 2016-08-11 DIAGNOSIS — N2581 Secondary hyperparathyroidism of renal origin: Secondary | ICD-10-CM | POA: Diagnosis not present

## 2016-08-11 DIAGNOSIS — D631 Anemia in chronic kidney disease: Secondary | ICD-10-CM | POA: Diagnosis not present

## 2016-08-11 DIAGNOSIS — I959 Hypotension, unspecified: Secondary | ICD-10-CM | POA: Diagnosis not present

## 2016-08-11 DIAGNOSIS — N186 End stage renal disease: Secondary | ICD-10-CM | POA: Diagnosis not present

## 2016-08-11 DIAGNOSIS — Z992 Dependence on renal dialysis: Secondary | ICD-10-CM | POA: Diagnosis not present

## 2016-08-13 DIAGNOSIS — N2581 Secondary hyperparathyroidism of renal origin: Secondary | ICD-10-CM | POA: Diagnosis not present

## 2016-08-13 DIAGNOSIS — Z992 Dependence on renal dialysis: Secondary | ICD-10-CM | POA: Diagnosis not present

## 2016-08-13 DIAGNOSIS — I959 Hypotension, unspecified: Secondary | ICD-10-CM | POA: Diagnosis not present

## 2016-08-13 DIAGNOSIS — D631 Anemia in chronic kidney disease: Secondary | ICD-10-CM | POA: Diagnosis not present

## 2016-08-13 DIAGNOSIS — N186 End stage renal disease: Secondary | ICD-10-CM | POA: Diagnosis not present

## 2016-08-16 DIAGNOSIS — D631 Anemia in chronic kidney disease: Secondary | ICD-10-CM | POA: Diagnosis not present

## 2016-08-16 DIAGNOSIS — Z992 Dependence on renal dialysis: Secondary | ICD-10-CM | POA: Diagnosis not present

## 2016-08-16 DIAGNOSIS — I959 Hypotension, unspecified: Secondary | ICD-10-CM | POA: Diagnosis not present

## 2016-08-16 DIAGNOSIS — N2581 Secondary hyperparathyroidism of renal origin: Secondary | ICD-10-CM | POA: Diagnosis not present

## 2016-08-16 DIAGNOSIS — N186 End stage renal disease: Secondary | ICD-10-CM | POA: Diagnosis not present

## 2016-08-18 DIAGNOSIS — D631 Anemia in chronic kidney disease: Secondary | ICD-10-CM | POA: Diagnosis not present

## 2016-08-18 DIAGNOSIS — N2581 Secondary hyperparathyroidism of renal origin: Secondary | ICD-10-CM | POA: Diagnosis not present

## 2016-08-18 DIAGNOSIS — Z992 Dependence on renal dialysis: Secondary | ICD-10-CM | POA: Diagnosis not present

## 2016-08-18 DIAGNOSIS — N186 End stage renal disease: Secondary | ICD-10-CM | POA: Diagnosis not present

## 2016-08-18 DIAGNOSIS — I959 Hypotension, unspecified: Secondary | ICD-10-CM | POA: Diagnosis not present

## 2016-08-23 DIAGNOSIS — D631 Anemia in chronic kidney disease: Secondary | ICD-10-CM | POA: Diagnosis not present

## 2016-08-23 DIAGNOSIS — N2581 Secondary hyperparathyroidism of renal origin: Secondary | ICD-10-CM | POA: Diagnosis not present

## 2016-08-23 DIAGNOSIS — I959 Hypotension, unspecified: Secondary | ICD-10-CM | POA: Diagnosis not present

## 2016-08-23 DIAGNOSIS — Z992 Dependence on renal dialysis: Secondary | ICD-10-CM | POA: Diagnosis not present

## 2016-08-23 DIAGNOSIS — N186 End stage renal disease: Secondary | ICD-10-CM | POA: Diagnosis not present

## 2016-08-25 DIAGNOSIS — I959 Hypotension, unspecified: Secondary | ICD-10-CM | POA: Diagnosis not present

## 2016-08-25 DIAGNOSIS — N2581 Secondary hyperparathyroidism of renal origin: Secondary | ICD-10-CM | POA: Diagnosis not present

## 2016-08-25 DIAGNOSIS — Z992 Dependence on renal dialysis: Secondary | ICD-10-CM | POA: Diagnosis not present

## 2016-08-25 DIAGNOSIS — N186 End stage renal disease: Secondary | ICD-10-CM | POA: Diagnosis not present

## 2016-08-25 DIAGNOSIS — I482 Chronic atrial fibrillation: Secondary | ICD-10-CM | POA: Diagnosis not present

## 2016-08-25 DIAGNOSIS — R899 Unspecified abnormal finding in specimens from other organs, systems and tissues: Secondary | ICD-10-CM | POA: Diagnosis not present

## 2016-08-25 DIAGNOSIS — D631 Anemia in chronic kidney disease: Secondary | ICD-10-CM | POA: Diagnosis not present

## 2016-08-30 DIAGNOSIS — D631 Anemia in chronic kidney disease: Secondary | ICD-10-CM | POA: Diagnosis not present

## 2016-08-30 DIAGNOSIS — N2581 Secondary hyperparathyroidism of renal origin: Secondary | ICD-10-CM | POA: Diagnosis not present

## 2016-08-30 DIAGNOSIS — Z992 Dependence on renal dialysis: Secondary | ICD-10-CM | POA: Diagnosis not present

## 2016-08-30 DIAGNOSIS — I959 Hypotension, unspecified: Secondary | ICD-10-CM | POA: Diagnosis not present

## 2016-08-30 DIAGNOSIS — N186 End stage renal disease: Secondary | ICD-10-CM | POA: Diagnosis not present

## 2016-08-30 NOTE — Progress Notes (Signed)
Cardiology Office Note   Date:  08/31/2016   ID:  Corey [name], DOB [birth year], MRN [id number]  PCP:  Patient, No Pcp Per  Cardiologist:  Dr. [name]    Chief Complaint  Patient presents with  . Atrial Fibrillation      History of Present Illness: Corey [name] is a 46 y.o. male who presents for atrial fib. - he has had aortic dissection in 2004 with repair., ESRD on HD on T,TH, SAT.   Hx of DCCV in 2014.   With his surgery he was in a coma 3 months, in hospital for 2 years.  Blind except for shapes.   Paralyzed.  Last seen by Dr. [name] a year ago.    Today he has no chest pain or SOB.  He has had episodes of irregular heart beat.  Last week off and on for several days. He is on coumadin.  He is on labetolol but cannot increase due to hypotension.  Otherwise he has been doing well. His BP has been lower more recently.    Echo 02/15/16 with EF 65-70%, mild LVH, no RWMA, aortic root 40 mm, ascending aorta was mildly dilated.  PA pressure 39 mmHg.   Past Medical History:  Diagnosis Date  . A-fib ([place])   . Arthritis    hands and shoulders  . Blindness and low vision    "Stargardt disease"  . Dissection of aorta ([place]) 2004  . Dysrhythmia    A-fib  . ESRD (end stage renal disease) ([place])   . Headache   . History of cardioversion 2014  . Hypertension   . Neuropathy   . Non-healing non-surgical wound 03/2016  . Paralysis ([place])    due to dissection of aorta in 2004, lower extremities  . Pneumonia     Past Surgical History:  Procedure Laterality Date  . APPLICATION OF WOUND VAC Left 04/13/2016   Procedure: APPLICATION OF WOUND VAC;  Surgeon: Conrad [place], MD;  Location: [facility];  Service: Vascular;  Laterality: Left;  . APPLICATION OF WOUND VAC Left 04/18/2016   Procedure: APPLICATION OF WOUND VAC;  Surgeon: Waynetta Sandy, MD;  Location: [facility];  Service: Vascular;  Laterality: Left;  Wound vac change   . APPLICATION OF WOUND VAC Left 04/20/2016   Procedure: WOUND VAC  CHANGE;  Surgeon: Conrad Minden, MD;  Location: [facility];  Service: Vascular;  Laterality: Left;  . AV FISTULA PLACEMENT    . BASCILIC VEIN TRANSPOSITION Left 12/09/2015   Procedure: FIRST STAGE BASILIC VEIN TRANSPOSITION LEFT UPPER ARM;  Surgeon: Conrad [place], MD;  Location: [facility];  Service: Vascular;  Laterality: Left;  . BASCILIC VEIN TRANSPOSITION Left 03/09/2016   Procedure: SECOND STAGE BASILIC VEIN TRANSPOSITION WITH REVISION OF ANASTOMOSIS LEFT UPPER ARM;  Surgeon: Conrad Westgate, MD;  Location: [facility];  Service: Vascular;  Laterality: Left;  . CARDIOVERSION    . REPAIR OF ACUTE ASCENDING THORACIC AORTIC DISSECTION    . REVISON OF ARTERIOVENOUS FISTULA Left 04/20/2016   Procedure: LIGATION OF BASILIC VEIN TRANSPOSITION;  Surgeon: Conrad [place], MD;  Location: [facility];  Service: Vascular;  Laterality: Left;  . WOUND DEBRIDEMENT Left 04/13/2016   Procedure: DEBRIDEMENT WOUND;  Surgeon: Conrad Lancaster, MD;  Location: [facility];  Service: Vascular;  Laterality: Left;     Current Outpatient Prescriptions  Medication Sig Dispense Refill  . cinacalcet (SENSIPAR) 90 MG tablet Take 90 mg by mouth daily with supper.    . gabapentin (  NEURONTIN) 100 MG capsule Take 1 capsule (100 mg total) by mouth 3 (three) times daily. 90 capsule 0  . labetalol (NORMODYNE) 300 MG tablet Take 300 mg by mouth 2 (two) times daily.    [name] RENVELA 800 MG tablet Take 800 mg by mouth 2 (two) times daily.  11  . warfarin (COUMADIN) 5 MG tablet Take as directed by [facility] 50 tablet 3   No current facility-administered medications for this visit.     Allergies:   Heparin    Social History:  The patient  reports that he has never smoked. He has never used smokeless tobacco. He reports that he does not drink alcohol or use drugs.   Family History:  The patient's family history includes Cancer in his father and mother; Hypertension in his father and mother; Stroke in his brother; Thyroid disease in his sister.    ROS:   General:no colds or fevers, no weight changes Skin:no rashes or ulcers HEENT:no blurred vision, no congestion though only sees shapes CV:see HPI PUL:see HPI GI:no diarrhea constipation or melena, no indigestion GU:no hematuria, no dysuria MS:no joint pain, no claudication Neuro:no syncope, no lightheadedness Endo:no diabetes, no thyroid disease  Is In a wheel chair and lives with his brother.   Wt Readings from Last 3 Encounters:  08/31/16 192 lb (87.1 kg)  07/15/16 192 lb (87.1 kg)  06/10/16 192 lb (87.1 kg)     PHYSICAL EXAM: VS:  BP 98/60   Pulse 81   Ht 6\' 2"  (1.88 m)   Wt 192 lb (87.1 kg)   SpO2 97%   BMI 24.65 kg/m  , BMI Body mass index is 24.65 kg/m. General:Pleasant affect, NAD Skin:Warm and dry, brisk capillary refill HEENT:normocephalic, sclera clear, mucus membranes moist Neck:supple, no JVD, no bruits  Heart:S1S2 RRR without murmur, gallup, rub or click, occ premature beat Lungs:clear without rales, rhonchi, or wheezes IRS:WNIO, non tender, + BS, do not palpate liver spleen or masses Ext:tr lower ext edema,, 2+ radial pulses Neuro:alert and oriented X 3, MAE, follows commands, + facial symmetry    EKG:  EKG is ordered today. The ekg ordered today demonstrates SR with RAD occ PVC on QRS comples.  QTc 481.  No acute changes except for PVCs.     Recent Labs: 04/26/2016: ALT 7 04/30/2016: BUN 55; Creatinine, Ser 10.77; Hemoglobin 7.3; Platelets 173; Potassium 4.6; Sodium 141    Lipid Panel No results found for: CHOL, TRIG, HDL, CHOLHDL, VLDL, LDLCALC, LDLDIRECT     Other studies Reviewed: Additional studies/ records that were reviewed today include: . ECHO: 02/15/16 Study Conclusions  - Left ventricle: The cavity size was normal. Wall thickness was   increased in a pattern of mild LVH. Systolic function was   vigorous. The estimated ejection fraction was in the range of 65%   to 70%. Wall motion was normal; there were no regional wall   motion  abnormalities. - Aorta: Aortic root dimension: 40 mm (ED). - Ascending aorta: The ascending aorta was mildly dilated. - Left atrium: The atrium was mildly dilated. - Tricuspid valve: There was trivial regurgitation. - Pulmonary arteries: Systolic pressure was mildly increased. PA   peak pressure: 39 mm Hg (S).  -------------------------------------------------------------------  ASSESSMENT AND PLAN:  1.  PAF has had more episodes lately.  Will check labs today TSH, free T4 and BMP, will ask him to wear 30 day event monitor as well. Unable to increase BB due to hypotension.   If increased a fib  will discuss with Dr. [name].  Follow up with Dr. [name] in 6 months.   2.   Hx aortic dissection stable  3. ESRD on HD t. Th, sat  4. HTN now more hypotension   5. anticoagulation no bleeding. Followed in coumadin clinic   Current medicines are reviewed with the patient today.  The patient Has no concerns regarding medicines.  The following changes have been made:  See above Labs/ tests ordered today include:see above  Disposition:   FU:  see above  Signed, Cecilie Kicks, NP  08/31/2016 10:46 AM    [address], [place], [place], [place] Phone: [phone]; Fax: [phone]

## 2016-08-31 ENCOUNTER — Ambulatory Visit (INDEPENDENT_AMBULATORY_CARE_PROVIDER_SITE_OTHER): Payer: Medicare Other | Admitting: Cardiology

## 2016-08-31 ENCOUNTER — Encounter: Payer: Self-pay | Admitting: Cardiology

## 2016-08-31 VITALS — BP 98/60 | HR 81 | Ht 74.0 in | Wt 192.0 lb

## 2016-08-31 DIAGNOSIS — I4891 Unspecified atrial fibrillation: Secondary | ICD-10-CM | POA: Diagnosis not present

## 2016-08-31 DIAGNOSIS — Z8679 Personal history of other diseases of the circulatory system: Secondary | ICD-10-CM

## 2016-08-31 DIAGNOSIS — I10 Essential (primary) hypertension: Secondary | ICD-10-CM

## 2016-08-31 DIAGNOSIS — Z7901 Long term (current) use of anticoagulants: Secondary | ICD-10-CM

## 2016-08-31 DIAGNOSIS — N186 End stage renal disease: Secondary | ICD-10-CM | POA: Diagnosis not present

## 2016-08-31 LAB — BASIC METABOLIC PANEL
BUN/Creatinine Ratio: 3 — ABNORMAL LOW (ref 9–20)
BUN: 22 mg/dL (ref 6–24)
CALCIUM: 9.6 mg/dL (ref 8.7–10.2)
CO2: 19 mmol/L (ref 18–29)
CREATININE: 8.31 mg/dL — AB (ref 0.76–1.27)
Chloride: 98 mmol/L (ref 96–106)
GFR, EST AFRICAN AMERICAN: 8 mL/min/{1.73_m2} — AB (ref >59)
GFR, EST NON AFRICAN AMERICAN: 7 mL/min/{1.73_m2} — AB (ref >59)
Glucose: 96 mg/dL (ref 65–99)
Potassium: 3.7 mmol/L (ref 3.5–5.2)
Sodium: 141 mmol/L (ref 134–144)

## 2016-08-31 LAB — T4, FREE: FREE T4: 1.18 ng/dL (ref 0.82–1.77)

## 2016-08-31 LAB — TSH: TSH: 1.43 u[IU]/mL (ref 0.450–4.500)

## 2016-08-31 LAB — PROTIME-INR
INR: 1.8 — AB (ref 0.8–1.2)
PROTHROMBIN TIME: 18.4 s — AB (ref 9.1–12.0)

## 2016-08-31 LAB — MAGNESIUM: Magnesium: 2.1 mg/dL (ref 1.6–2.3)

## 2016-08-31 NOTE — Patient Instructions (Signed)
Medication Instructions:  Your physician recommends that you continue on your current medications as directed. Please refer to the Current Medication list given to you today.   Labwork: Your physician recommends that you return for lab work today for BMET, CBC, TSH, T4, MAGNESIUM  Testing/Procedures: None Ordered   Follow-Up: Your physician wants you to follow-up in: 1 year with Dr. [name]. You will receive a reminder letter in the mail two months in advance. If you don't receive a letter, please call our office to schedule the follow-up appointment.   Any Other Special Instructions Will Be Listed Below (If Applicable).     If you need a refill on your cardiac medications before your next appointment, please call your pharmacy.  Thank you for choosing [facility]

## 2016-09-01 ENCOUNTER — Telehealth: Payer: Self-pay | Admitting: Cardiology

## 2016-09-01 DIAGNOSIS — N186 End stage renal disease: Secondary | ICD-10-CM | POA: Diagnosis not present

## 2016-09-01 DIAGNOSIS — N2581 Secondary hyperparathyroidism of renal origin: Secondary | ICD-10-CM | POA: Diagnosis not present

## 2016-09-01 DIAGNOSIS — I4891 Unspecified atrial fibrillation: Secondary | ICD-10-CM

## 2016-09-01 DIAGNOSIS — D631 Anemia in chronic kidney disease: Secondary | ICD-10-CM | POA: Diagnosis not present

## 2016-09-01 DIAGNOSIS — I959 Hypotension, unspecified: Secondary | ICD-10-CM | POA: Diagnosis not present

## 2016-09-01 DIAGNOSIS — Z992 Dependence on renal dialysis: Secondary | ICD-10-CM | POA: Diagnosis not present

## 2016-09-01 DIAGNOSIS — I129 Hypertensive chronic kidney disease with stage 1 through stage 4 chronic kidney disease, or unspecified chronic kidney disease: Secondary | ICD-10-CM | POA: Diagnosis not present

## 2016-09-01 NOTE — Telephone Encounter (Signed)
New Message ° ° pt verbalized that she is returning call for rn °

## 2016-09-01 NOTE — Telephone Encounter (Signed)
-----   Message from Isaiah Serge, NP sent at 08/31/2016  4:59 PM EDT ----- Labs are stable - lets do 30 day event monitor for possible atrial fib, tachycardia.  Thanks.

## 2016-09-02 ENCOUNTER — Ambulatory Visit (INDEPENDENT_AMBULATORY_CARE_PROVIDER_SITE_OTHER): Payer: Medicare Other | Admitting: Cardiology

## 2016-09-02 DIAGNOSIS — I4891 Unspecified atrial fibrillation: Secondary | ICD-10-CM | POA: Diagnosis not present

## 2016-09-03 DIAGNOSIS — N186 End stage renal disease: Secondary | ICD-10-CM | POA: Diagnosis not present

## 2016-09-03 DIAGNOSIS — N2581 Secondary hyperparathyroidism of renal origin: Secondary | ICD-10-CM | POA: Diagnosis not present

## 2016-09-03 DIAGNOSIS — Z992 Dependence on renal dialysis: Secondary | ICD-10-CM | POA: Diagnosis not present

## 2016-09-03 NOTE — Addendum Note (Signed)
Addendum  created 09/03/16 0816 by Maryan Sivak, MD   Sign clinical note    

## 2016-09-06 DIAGNOSIS — N186 End stage renal disease: Secondary | ICD-10-CM | POA: Diagnosis not present

## 2016-09-06 DIAGNOSIS — N2581 Secondary hyperparathyroidism of renal origin: Secondary | ICD-10-CM | POA: Diagnosis not present

## 2016-09-06 DIAGNOSIS — Z992 Dependence on renal dialysis: Secondary | ICD-10-CM | POA: Diagnosis not present

## 2016-09-08 ENCOUNTER — Encounter: Payer: Self-pay | Admitting: Nephrology

## 2016-09-08 DIAGNOSIS — N2581 Secondary hyperparathyroidism of renal origin: Secondary | ICD-10-CM | POA: Diagnosis not present

## 2016-09-08 DIAGNOSIS — N186 End stage renal disease: Secondary | ICD-10-CM | POA: Diagnosis not present

## 2016-09-08 DIAGNOSIS — Z992 Dependence on renal dialysis: Secondary | ICD-10-CM | POA: Diagnosis not present

## 2016-09-10 DIAGNOSIS — N186 End stage renal disease: Secondary | ICD-10-CM | POA: Diagnosis not present

## 2016-09-10 DIAGNOSIS — N2581 Secondary hyperparathyroidism of renal origin: Secondary | ICD-10-CM | POA: Diagnosis not present

## 2016-09-10 DIAGNOSIS — Z992 Dependence on renal dialysis: Secondary | ICD-10-CM | POA: Diagnosis not present

## 2016-09-14 ENCOUNTER — Ambulatory Visit (INDEPENDENT_AMBULATORY_CARE_PROVIDER_SITE_OTHER): Payer: Medicare Other | Admitting: *Deleted

## 2016-09-14 ENCOUNTER — Ambulatory Visit (INDEPENDENT_AMBULATORY_CARE_PROVIDER_SITE_OTHER): Payer: Medicare Other

## 2016-09-14 DIAGNOSIS — I4891 Unspecified atrial fibrillation: Secondary | ICD-10-CM

## 2016-09-14 LAB — POCT INR: INR: 1.7

## 2016-09-15 DIAGNOSIS — N2581 Secondary hyperparathyroidism of renal origin: Secondary | ICD-10-CM | POA: Diagnosis not present

## 2016-09-15 DIAGNOSIS — Z992 Dependence on renal dialysis: Secondary | ICD-10-CM | POA: Diagnosis not present

## 2016-09-15 DIAGNOSIS — N186 End stage renal disease: Secondary | ICD-10-CM | POA: Diagnosis not present

## 2016-09-17 DIAGNOSIS — N186 End stage renal disease: Secondary | ICD-10-CM | POA: Diagnosis not present

## 2016-09-17 DIAGNOSIS — N2581 Secondary hyperparathyroidism of renal origin: Secondary | ICD-10-CM | POA: Diagnosis not present

## 2016-09-17 DIAGNOSIS — Z992 Dependence on renal dialysis: Secondary | ICD-10-CM | POA: Diagnosis not present

## 2016-09-19 ENCOUNTER — Telehealth: Payer: Self-pay

## 2016-09-19 NOTE — Telephone Encounter (Signed)
Monitor results received and reviewed with Cecilie Kicks, PA.  Patient did have afib occurrence on monitor at rate of 120 bpm. Patient is on Coumadin and Labetalol 300 mg BID. Per Cecilie Kicks, Dr. [name] will be consulted for additional medication recommendations since patient's BP was 98/60 at last OV.  Left message for patient to call back to discuss plan.   Forwarded to Dr. [name].

## 2016-09-20 DIAGNOSIS — N2581 Secondary hyperparathyroidism of renal origin: Secondary | ICD-10-CM | POA: Diagnosis not present

## 2016-09-20 DIAGNOSIS — N186 End stage renal disease: Secondary | ICD-10-CM | POA: Diagnosis not present

## 2016-09-20 DIAGNOSIS — Z992 Dependence on renal dialysis: Secondary | ICD-10-CM | POA: Diagnosis not present

## 2016-09-20 NOTE — Telephone Encounter (Signed)
Follow Up:    Pt is returning your call from yesterday 

## 2016-09-20 NOTE — Telephone Encounter (Signed)
Spoke with pt today. Pt states that when these A-fib episodes were happening, he felt that his heart was beating harder , no other symptoms. Pt said that he is feeling fine now. No palpitations at this time. Pt is taking coumadin and Labetalol 300 mg BID. Pt is aware that this message was send to Dr [name] for reviewing and recommendations if needed.

## 2016-09-22 DIAGNOSIS — Z992 Dependence on renal dialysis: Secondary | ICD-10-CM | POA: Diagnosis not present

## 2016-09-22 DIAGNOSIS — N2581 Secondary hyperparathyroidism of renal origin: Secondary | ICD-10-CM | POA: Diagnosis not present

## 2016-09-22 DIAGNOSIS — N186 End stage renal disease: Secondary | ICD-10-CM | POA: Diagnosis not present

## 2016-09-23 NOTE — Telephone Encounter (Signed)
No further changes Candee Furbish, MD

## 2016-09-23 NOTE — Telephone Encounter (Signed)
Spoke with the pt and informed him that per Dr [name], no further changes need to be made, and he should continue on his current regimen.  Pt verbalized understanding and agrees with this plan.

## 2016-09-24 DIAGNOSIS — N2581 Secondary hyperparathyroidism of renal origin: Secondary | ICD-10-CM | POA: Diagnosis not present

## 2016-09-24 DIAGNOSIS — N186 End stage renal disease: Secondary | ICD-10-CM | POA: Diagnosis not present

## 2016-09-24 DIAGNOSIS — Z992 Dependence on renal dialysis: Secondary | ICD-10-CM | POA: Diagnosis not present

## 2016-09-28 ENCOUNTER — Ambulatory Visit (INDEPENDENT_AMBULATORY_CARE_PROVIDER_SITE_OTHER): Payer: Medicare Other | Admitting: *Deleted

## 2016-09-28 DIAGNOSIS — I4891 Unspecified atrial fibrillation: Secondary | ICD-10-CM

## 2016-09-28 LAB — POCT INR: INR: 3.7

## 2016-09-29 DIAGNOSIS — N186 End stage renal disease: Secondary | ICD-10-CM | POA: Diagnosis not present

## 2016-09-29 DIAGNOSIS — I482 Chronic atrial fibrillation: Secondary | ICD-10-CM | POA: Diagnosis not present

## 2016-09-29 DIAGNOSIS — N2581 Secondary hyperparathyroidism of renal origin: Secondary | ICD-10-CM | POA: Diagnosis not present

## 2016-09-29 DIAGNOSIS — Z992 Dependence on renal dialysis: Secondary | ICD-10-CM | POA: Diagnosis not present

## 2016-09-30 ENCOUNTER — Other Ambulatory Visit: Payer: Self-pay | Admitting: Cardiology

## 2016-10-01 DIAGNOSIS — Z992 Dependence on renal dialysis: Secondary | ICD-10-CM | POA: Diagnosis not present

## 2016-10-01 DIAGNOSIS — N2581 Secondary hyperparathyroidism of renal origin: Secondary | ICD-10-CM | POA: Diagnosis not present

## 2016-10-01 DIAGNOSIS — I129 Hypertensive chronic kidney disease with stage 1 through stage 4 chronic kidney disease, or unspecified chronic kidney disease: Secondary | ICD-10-CM | POA: Diagnosis not present

## 2016-10-01 DIAGNOSIS — N186 End stage renal disease: Secondary | ICD-10-CM | POA: Diagnosis not present

## 2016-10-04 DIAGNOSIS — N2581 Secondary hyperparathyroidism of renal origin: Secondary | ICD-10-CM | POA: Diagnosis not present

## 2016-10-04 DIAGNOSIS — N186 End stage renal disease: Secondary | ICD-10-CM | POA: Diagnosis not present

## 2016-10-04 DIAGNOSIS — Z992 Dependence on renal dialysis: Secondary | ICD-10-CM | POA: Diagnosis not present

## 2016-10-06 DIAGNOSIS — N186 End stage renal disease: Secondary | ICD-10-CM | POA: Diagnosis not present

## 2016-10-06 DIAGNOSIS — N2581 Secondary hyperparathyroidism of renal origin: Secondary | ICD-10-CM | POA: Diagnosis not present

## 2016-10-06 DIAGNOSIS — Z992 Dependence on renal dialysis: Secondary | ICD-10-CM | POA: Diagnosis not present

## 2016-10-11 DIAGNOSIS — N2581 Secondary hyperparathyroidism of renal origin: Secondary | ICD-10-CM | POA: Diagnosis not present

## 2016-10-11 DIAGNOSIS — Z992 Dependence on renal dialysis: Secondary | ICD-10-CM | POA: Diagnosis not present

## 2016-10-11 DIAGNOSIS — N186 End stage renal disease: Secondary | ICD-10-CM | POA: Diagnosis not present

## 2016-10-12 ENCOUNTER — Ambulatory Visit (INDEPENDENT_AMBULATORY_CARE_PROVIDER_SITE_OTHER): Payer: Medicare Other | Admitting: Pharmacist

## 2016-10-12 DIAGNOSIS — I4891 Unspecified atrial fibrillation: Secondary | ICD-10-CM | POA: Diagnosis not present

## 2016-10-12 LAB — POCT INR: INR: 3.2

## 2016-10-15 DIAGNOSIS — Z992 Dependence on renal dialysis: Secondary | ICD-10-CM | POA: Diagnosis not present

## 2016-10-15 DIAGNOSIS — N186 End stage renal disease: Secondary | ICD-10-CM | POA: Diagnosis not present

## 2016-10-15 DIAGNOSIS — N2581 Secondary hyperparathyroidism of renal origin: Secondary | ICD-10-CM | POA: Diagnosis not present

## 2016-10-18 DIAGNOSIS — N2581 Secondary hyperparathyroidism of renal origin: Secondary | ICD-10-CM | POA: Diagnosis not present

## 2016-10-18 DIAGNOSIS — N186 End stage renal disease: Secondary | ICD-10-CM | POA: Diagnosis not present

## 2016-10-18 DIAGNOSIS — Z992 Dependence on renal dialysis: Secondary | ICD-10-CM | POA: Diagnosis not present

## 2016-10-22 DIAGNOSIS — N2581 Secondary hyperparathyroidism of renal origin: Secondary | ICD-10-CM | POA: Diagnosis not present

## 2016-10-22 DIAGNOSIS — N186 End stage renal disease: Secondary | ICD-10-CM | POA: Diagnosis not present

## 2016-10-22 DIAGNOSIS — Z992 Dependence on renal dialysis: Secondary | ICD-10-CM | POA: Diagnosis not present

## 2016-10-25 DIAGNOSIS — N2581 Secondary hyperparathyroidism of renal origin: Secondary | ICD-10-CM | POA: Diagnosis not present

## 2016-10-25 DIAGNOSIS — N186 End stage renal disease: Secondary | ICD-10-CM | POA: Diagnosis not present

## 2016-10-25 DIAGNOSIS — Z992 Dependence on renal dialysis: Secondary | ICD-10-CM | POA: Diagnosis not present

## 2016-10-27 DIAGNOSIS — I482 Chronic atrial fibrillation: Secondary | ICD-10-CM | POA: Diagnosis not present

## 2016-10-27 DIAGNOSIS — N186 End stage renal disease: Secondary | ICD-10-CM | POA: Diagnosis not present

## 2016-10-27 DIAGNOSIS — N2581 Secondary hyperparathyroidism of renal origin: Secondary | ICD-10-CM | POA: Diagnosis not present

## 2016-10-27 DIAGNOSIS — Z992 Dependence on renal dialysis: Secondary | ICD-10-CM | POA: Diagnosis not present

## 2016-10-29 DIAGNOSIS — N186 End stage renal disease: Secondary | ICD-10-CM | POA: Diagnosis not present

## 2016-10-29 DIAGNOSIS — N2581 Secondary hyperparathyroidism of renal origin: Secondary | ICD-10-CM | POA: Diagnosis not present

## 2016-10-29 DIAGNOSIS — Z992 Dependence on renal dialysis: Secondary | ICD-10-CM | POA: Diagnosis not present

## 2016-11-01 DIAGNOSIS — I129 Hypertensive chronic kidney disease with stage 1 through stage 4 chronic kidney disease, or unspecified chronic kidney disease: Secondary | ICD-10-CM | POA: Diagnosis not present

## 2016-11-01 DIAGNOSIS — N2581 Secondary hyperparathyroidism of renal origin: Secondary | ICD-10-CM | POA: Diagnosis not present

## 2016-11-01 DIAGNOSIS — Z992 Dependence on renal dialysis: Secondary | ICD-10-CM | POA: Diagnosis not present

## 2016-11-01 DIAGNOSIS — N186 End stage renal disease: Secondary | ICD-10-CM | POA: Diagnosis not present

## 2016-11-05 DIAGNOSIS — N2581 Secondary hyperparathyroidism of renal origin: Secondary | ICD-10-CM | POA: Diagnosis not present

## 2016-11-05 DIAGNOSIS — N186 End stage renal disease: Secondary | ICD-10-CM | POA: Diagnosis not present

## 2016-11-05 DIAGNOSIS — Z992 Dependence on renal dialysis: Secondary | ICD-10-CM | POA: Diagnosis not present

## 2016-11-08 ENCOUNTER — Other Ambulatory Visit: Payer: Self-pay | Admitting: Cardiology

## 2016-11-08 DIAGNOSIS — N186 End stage renal disease: Secondary | ICD-10-CM | POA: Diagnosis not present

## 2016-11-08 DIAGNOSIS — Z992 Dependence on renal dialysis: Secondary | ICD-10-CM | POA: Diagnosis not present

## 2016-11-08 DIAGNOSIS — N2581 Secondary hyperparathyroidism of renal origin: Secondary | ICD-10-CM | POA: Diagnosis not present

## 2016-11-08 NOTE — Telephone Encounter (Signed)
Overdue for follow-up, missed appt on 10/26/16.  Attempted to call pt's home # LMOM TCB for appt.  Called cell # LM with friend/family member to have pt call clinic to rescheduled missed appt so rx can be refilled.

## 2016-11-10 ENCOUNTER — Other Ambulatory Visit: Payer: Self-pay | Admitting: *Deleted

## 2016-11-10 DIAGNOSIS — N2581 Secondary hyperparathyroidism of renal origin: Secondary | ICD-10-CM | POA: Diagnosis not present

## 2016-11-10 DIAGNOSIS — N186 End stage renal disease: Secondary | ICD-10-CM | POA: Diagnosis not present

## 2016-11-10 DIAGNOSIS — Z992 Dependence on renal dialysis: Secondary | ICD-10-CM | POA: Diagnosis not present

## 2016-11-11 ENCOUNTER — Ambulatory Visit (INDEPENDENT_AMBULATORY_CARE_PROVIDER_SITE_OTHER): Payer: Medicare Other | Admitting: *Deleted

## 2016-11-11 DIAGNOSIS — I4891 Unspecified atrial fibrillation: Secondary | ICD-10-CM | POA: Diagnosis not present

## 2016-11-11 LAB — POCT INR: INR: 2.4

## 2016-11-12 DIAGNOSIS — N186 End stage renal disease: Secondary | ICD-10-CM | POA: Diagnosis not present

## 2016-11-12 DIAGNOSIS — N2581 Secondary hyperparathyroidism of renal origin: Secondary | ICD-10-CM | POA: Diagnosis not present

## 2016-11-12 DIAGNOSIS — Z992 Dependence on renal dialysis: Secondary | ICD-10-CM | POA: Diagnosis not present

## 2016-11-17 DIAGNOSIS — N2581 Secondary hyperparathyroidism of renal origin: Secondary | ICD-10-CM | POA: Diagnosis not present

## 2016-11-17 DIAGNOSIS — Z992 Dependence on renal dialysis: Secondary | ICD-10-CM | POA: Diagnosis not present

## 2016-11-17 DIAGNOSIS — N186 End stage renal disease: Secondary | ICD-10-CM | POA: Diagnosis not present

## 2016-11-18 ENCOUNTER — Other Ambulatory Visit: Payer: Self-pay | Admitting: Cardiology

## 2016-11-19 DIAGNOSIS — Z992 Dependence on renal dialysis: Secondary | ICD-10-CM | POA: Diagnosis not present

## 2016-11-19 DIAGNOSIS — N2581 Secondary hyperparathyroidism of renal origin: Secondary | ICD-10-CM | POA: Diagnosis not present

## 2016-11-19 DIAGNOSIS — N186 End stage renal disease: Secondary | ICD-10-CM | POA: Diagnosis not present

## 2016-11-22 DIAGNOSIS — Z992 Dependence on renal dialysis: Secondary | ICD-10-CM | POA: Diagnosis not present

## 2016-11-22 DIAGNOSIS — N186 End stage renal disease: Secondary | ICD-10-CM | POA: Diagnosis not present

## 2016-11-22 DIAGNOSIS — N2581 Secondary hyperparathyroidism of renal origin: Secondary | ICD-10-CM | POA: Diagnosis not present

## 2016-11-26 DIAGNOSIS — N2581 Secondary hyperparathyroidism of renal origin: Secondary | ICD-10-CM | POA: Diagnosis not present

## 2016-11-26 DIAGNOSIS — I482 Chronic atrial fibrillation: Secondary | ICD-10-CM | POA: Diagnosis not present

## 2016-11-26 DIAGNOSIS — Z992 Dependence on renal dialysis: Secondary | ICD-10-CM | POA: Diagnosis not present

## 2016-11-26 DIAGNOSIS — N186 End stage renal disease: Secondary | ICD-10-CM | POA: Diagnosis not present

## 2016-11-29 DIAGNOSIS — N186 End stage renal disease: Secondary | ICD-10-CM | POA: Diagnosis not present

## 2016-11-29 DIAGNOSIS — N2581 Secondary hyperparathyroidism of renal origin: Secondary | ICD-10-CM | POA: Diagnosis not present

## 2016-11-29 DIAGNOSIS — Z992 Dependence on renal dialysis: Secondary | ICD-10-CM | POA: Diagnosis not present

## 2016-12-02 ENCOUNTER — Ambulatory Visit (INDEPENDENT_AMBULATORY_CARE_PROVIDER_SITE_OTHER): Payer: Medicare Other | Admitting: *Deleted

## 2016-12-02 DIAGNOSIS — I4891 Unspecified atrial fibrillation: Secondary | ICD-10-CM | POA: Diagnosis not present

## 2016-12-02 DIAGNOSIS — Z992 Dependence on renal dialysis: Secondary | ICD-10-CM | POA: Diagnosis not present

## 2016-12-02 DIAGNOSIS — N186 End stage renal disease: Secondary | ICD-10-CM | POA: Diagnosis not present

## 2016-12-02 DIAGNOSIS — I129 Hypertensive chronic kidney disease with stage 1 through stage 4 chronic kidney disease, or unspecified chronic kidney disease: Secondary | ICD-10-CM | POA: Diagnosis not present

## 2016-12-02 LAB — POCT INR: INR: 2.2

## 2016-12-03 DIAGNOSIS — Z992 Dependence on renal dialysis: Secondary | ICD-10-CM | POA: Diagnosis not present

## 2016-12-03 DIAGNOSIS — N186 End stage renal disease: Secondary | ICD-10-CM | POA: Diagnosis not present

## 2016-12-03 DIAGNOSIS — N2581 Secondary hyperparathyroidism of renal origin: Secondary | ICD-10-CM | POA: Diagnosis not present

## 2016-12-06 DIAGNOSIS — Z992 Dependence on renal dialysis: Secondary | ICD-10-CM | POA: Diagnosis not present

## 2016-12-06 DIAGNOSIS — N2581 Secondary hyperparathyroidism of renal origin: Secondary | ICD-10-CM | POA: Diagnosis not present

## 2016-12-06 DIAGNOSIS — N186 End stage renal disease: Secondary | ICD-10-CM | POA: Diagnosis not present

## 2016-12-08 DIAGNOSIS — Z992 Dependence on renal dialysis: Secondary | ICD-10-CM | POA: Diagnosis not present

## 2016-12-08 DIAGNOSIS — N186 End stage renal disease: Secondary | ICD-10-CM | POA: Diagnosis not present

## 2016-12-08 DIAGNOSIS — N2581 Secondary hyperparathyroidism of renal origin: Secondary | ICD-10-CM | POA: Diagnosis not present

## 2016-12-10 DIAGNOSIS — N2581 Secondary hyperparathyroidism of renal origin: Secondary | ICD-10-CM | POA: Diagnosis not present

## 2016-12-10 DIAGNOSIS — Z992 Dependence on renal dialysis: Secondary | ICD-10-CM | POA: Diagnosis not present

## 2016-12-10 DIAGNOSIS — N186 End stage renal disease: Secondary | ICD-10-CM | POA: Diagnosis not present

## 2016-12-13 ENCOUNTER — Encounter: Payer: Self-pay | Admitting: Nephrology

## 2016-12-13 DIAGNOSIS — Z992 Dependence on renal dialysis: Secondary | ICD-10-CM | POA: Diagnosis not present

## 2016-12-13 DIAGNOSIS — N2581 Secondary hyperparathyroidism of renal origin: Secondary | ICD-10-CM | POA: Diagnosis not present

## 2016-12-13 DIAGNOSIS — N186 End stage renal disease: Secondary | ICD-10-CM | POA: Diagnosis not present

## 2016-12-17 DIAGNOSIS — N186 End stage renal disease: Secondary | ICD-10-CM | POA: Diagnosis not present

## 2016-12-17 DIAGNOSIS — N2581 Secondary hyperparathyroidism of renal origin: Secondary | ICD-10-CM | POA: Diagnosis not present

## 2016-12-17 DIAGNOSIS — Z992 Dependence on renal dialysis: Secondary | ICD-10-CM | POA: Diagnosis not present

## 2016-12-22 DIAGNOSIS — N186 End stage renal disease: Secondary | ICD-10-CM | POA: Diagnosis not present

## 2016-12-22 DIAGNOSIS — N2581 Secondary hyperparathyroidism of renal origin: Secondary | ICD-10-CM | POA: Diagnosis not present

## 2016-12-22 DIAGNOSIS — Z992 Dependence on renal dialysis: Secondary | ICD-10-CM | POA: Diagnosis not present

## 2016-12-24 DIAGNOSIS — N186 End stage renal disease: Secondary | ICD-10-CM | POA: Diagnosis not present

## 2016-12-24 DIAGNOSIS — N2581 Secondary hyperparathyroidism of renal origin: Secondary | ICD-10-CM | POA: Diagnosis not present

## 2016-12-24 DIAGNOSIS — Z992 Dependence on renal dialysis: Secondary | ICD-10-CM | POA: Diagnosis not present

## 2016-12-27 DIAGNOSIS — Z992 Dependence on renal dialysis: Secondary | ICD-10-CM | POA: Diagnosis not present

## 2016-12-27 DIAGNOSIS — N2581 Secondary hyperparathyroidism of renal origin: Secondary | ICD-10-CM | POA: Diagnosis not present

## 2016-12-27 DIAGNOSIS — N186 End stage renal disease: Secondary | ICD-10-CM | POA: Diagnosis not present

## 2016-12-29 DIAGNOSIS — N2581 Secondary hyperparathyroidism of renal origin: Secondary | ICD-10-CM | POA: Diagnosis not present

## 2016-12-29 DIAGNOSIS — N186 End stage renal disease: Secondary | ICD-10-CM | POA: Diagnosis not present

## 2016-12-29 DIAGNOSIS — I482 Chronic atrial fibrillation: Secondary | ICD-10-CM | POA: Diagnosis not present

## 2016-12-29 DIAGNOSIS — Z992 Dependence on renal dialysis: Secondary | ICD-10-CM | POA: Diagnosis not present

## 2016-12-30 ENCOUNTER — Ambulatory Visit (INDEPENDENT_AMBULATORY_CARE_PROVIDER_SITE_OTHER): Payer: Medicare Other

## 2016-12-30 DIAGNOSIS — Z5181 Encounter for therapeutic drug level monitoring: Secondary | ICD-10-CM

## 2016-12-30 DIAGNOSIS — I4891 Unspecified atrial fibrillation: Secondary | ICD-10-CM | POA: Diagnosis not present

## 2016-12-30 DIAGNOSIS — Z7901 Long term (current) use of anticoagulants: Secondary | ICD-10-CM | POA: Insufficient documentation

## 2016-12-30 LAB — POCT INR: INR: 2.4

## 2016-12-31 DIAGNOSIS — N2581 Secondary hyperparathyroidism of renal origin: Secondary | ICD-10-CM | POA: Diagnosis not present

## 2016-12-31 DIAGNOSIS — Z992 Dependence on renal dialysis: Secondary | ICD-10-CM | POA: Diagnosis not present

## 2016-12-31 DIAGNOSIS — N186 End stage renal disease: Secondary | ICD-10-CM | POA: Diagnosis not present

## 2017-01-01 DIAGNOSIS — N186 End stage renal disease: Secondary | ICD-10-CM | POA: Diagnosis not present

## 2017-01-01 DIAGNOSIS — I129 Hypertensive chronic kidney disease with stage 1 through stage 4 chronic kidney disease, or unspecified chronic kidney disease: Secondary | ICD-10-CM | POA: Diagnosis not present

## 2017-01-01 DIAGNOSIS — Z992 Dependence on renal dialysis: Secondary | ICD-10-CM | POA: Diagnosis not present

## 2017-01-05 DIAGNOSIS — Z992 Dependence on renal dialysis: Secondary | ICD-10-CM | POA: Diagnosis not present

## 2017-01-05 DIAGNOSIS — N186 End stage renal disease: Secondary | ICD-10-CM | POA: Diagnosis not present

## 2017-01-05 DIAGNOSIS — N2581 Secondary hyperparathyroidism of renal origin: Secondary | ICD-10-CM | POA: Diagnosis not present

## 2017-01-07 DIAGNOSIS — N2581 Secondary hyperparathyroidism of renal origin: Secondary | ICD-10-CM | POA: Diagnosis not present

## 2017-01-07 DIAGNOSIS — N186 End stage renal disease: Secondary | ICD-10-CM | POA: Diagnosis not present

## 2017-01-07 DIAGNOSIS — Z992 Dependence on renal dialysis: Secondary | ICD-10-CM | POA: Diagnosis not present

## 2017-01-10 DIAGNOSIS — N2581 Secondary hyperparathyroidism of renal origin: Secondary | ICD-10-CM | POA: Diagnosis not present

## 2017-01-10 DIAGNOSIS — N186 End stage renal disease: Secondary | ICD-10-CM | POA: Diagnosis not present

## 2017-01-10 DIAGNOSIS — Z992 Dependence on renal dialysis: Secondary | ICD-10-CM | POA: Diagnosis not present

## 2017-01-12 DIAGNOSIS — Z992 Dependence on renal dialysis: Secondary | ICD-10-CM | POA: Diagnosis not present

## 2017-01-12 DIAGNOSIS — N186 End stage renal disease: Secondary | ICD-10-CM | POA: Diagnosis not present

## 2017-01-12 DIAGNOSIS — N2581 Secondary hyperparathyroidism of renal origin: Secondary | ICD-10-CM | POA: Diagnosis not present

## 2017-01-14 DIAGNOSIS — Z992 Dependence on renal dialysis: Secondary | ICD-10-CM | POA: Diagnosis not present

## 2017-01-14 DIAGNOSIS — N2581 Secondary hyperparathyroidism of renal origin: Secondary | ICD-10-CM | POA: Diagnosis not present

## 2017-01-14 DIAGNOSIS — N186 End stage renal disease: Secondary | ICD-10-CM | POA: Diagnosis not present

## 2017-01-18 ENCOUNTER — Inpatient Hospital Stay (HOSPITAL_COMMUNITY)
Admission: EM | Admit: 2017-01-18 | Discharge: 2017-01-26 | DRG: 299 | Disposition: A | Discharge status: Home / Self Care | Payer: Medicare Other | Attending: Internal Medicine | Admitting: Internal Medicine

## 2017-01-18 ENCOUNTER — Emergency Department (HOSPITAL_COMMUNITY): Payer: Medicare Other

## 2017-01-18 ENCOUNTER — Encounter (HOSPITAL_COMMUNITY): Payer: Self-pay | Admitting: Emergency Medicine

## 2017-01-18 DIAGNOSIS — R748 Abnormal levels of other serum enzymes: Secondary | ICD-10-CM | POA: Diagnosis not present

## 2017-01-18 DIAGNOSIS — I7101 Dissection of thoracic aorta: Secondary | ICD-10-CM | POA: Diagnosis not present

## 2017-01-18 DIAGNOSIS — J9811 Atelectasis: Secondary | ICD-10-CM | POA: Diagnosis present

## 2017-01-18 DIAGNOSIS — H547 Unspecified visual loss: Secondary | ICD-10-CM | POA: Diagnosis present

## 2017-01-18 DIAGNOSIS — R40241 Glasgow coma scale score 13-15, unspecified time: Secondary | ICD-10-CM | POA: Diagnosis present

## 2017-01-18 DIAGNOSIS — J9690 Respiratory failure, unspecified, unspecified whether with hypoxia or hypercapnia: Secondary | ICD-10-CM

## 2017-01-18 DIAGNOSIS — N186 End stage renal disease: Secondary | ICD-10-CM

## 2017-01-18 DIAGNOSIS — R079 Chest pain, unspecified: Secondary | ICD-10-CM

## 2017-01-18 DIAGNOSIS — N250 Renal osteodystrophy: Secondary | ICD-10-CM | POA: Diagnosis present

## 2017-01-18 DIAGNOSIS — I7100 Dissection of unspecified site of aorta: Secondary | ICD-10-CM | POA: Diagnosis not present

## 2017-01-18 DIAGNOSIS — I482 Chronic atrial fibrillation, unspecified: Secondary | ICD-10-CM

## 2017-01-18 DIAGNOSIS — N281 Cyst of kidney, acquired: Secondary | ICD-10-CM | POA: Diagnosis not present

## 2017-01-18 DIAGNOSIS — G839 Paralytic syndrome, unspecified: Secondary | ICD-10-CM | POA: Diagnosis present

## 2017-01-18 DIAGNOSIS — I120 Hypertensive chronic kidney disease with stage 5 chronic kidney disease or end stage renal disease: Secondary | ICD-10-CM | POA: Diagnosis not present

## 2017-01-18 DIAGNOSIS — Z993 Dependence on wheelchair: Secondary | ICD-10-CM | POA: Diagnosis not present

## 2017-01-18 DIAGNOSIS — R7989 Other specified abnormal findings of blood chemistry: Secondary | ICD-10-CM

## 2017-01-18 DIAGNOSIS — I716 Thoracoabdominal aortic aneurysm, without rupture, unspecified: Secondary | ICD-10-CM

## 2017-01-18 DIAGNOSIS — Z79899 Other long term (current) drug therapy: Secondary | ICD-10-CM

## 2017-01-18 DIAGNOSIS — D631 Anemia in chronic kidney disease: Secondary | ICD-10-CM | POA: Diagnosis not present

## 2017-01-18 DIAGNOSIS — R072 Precordial pain: Secondary | ICD-10-CM | POA: Diagnosis not present

## 2017-01-18 DIAGNOSIS — D6959 Other secondary thrombocytopenia: Secondary | ICD-10-CM | POA: Diagnosis present

## 2017-01-18 DIAGNOSIS — N2581 Secondary hyperparathyroidism of renal origin: Secondary | ICD-10-CM | POA: Diagnosis present

## 2017-01-18 DIAGNOSIS — I71019 Dissection of thoracic aorta, unspecified: Secondary | ICD-10-CM

## 2017-01-18 DIAGNOSIS — R778 Other specified abnormalities of plasma proteins: Secondary | ICD-10-CM

## 2017-01-18 DIAGNOSIS — I2510 Atherosclerotic heart disease of native coronary artery without angina pectoris: Secondary | ICD-10-CM | POA: Diagnosis present

## 2017-01-18 DIAGNOSIS — G629 Polyneuropathy, unspecified: Secondary | ICD-10-CM | POA: Diagnosis not present

## 2017-01-18 DIAGNOSIS — I5032 Chronic diastolic (congestive) heart failure: Secondary | ICD-10-CM | POA: Diagnosis not present

## 2017-01-18 DIAGNOSIS — D696 Thrombocytopenia, unspecified: Secondary | ICD-10-CM | POA: Diagnosis not present

## 2017-01-18 DIAGNOSIS — Z992 Dependence on renal dialysis: Secondary | ICD-10-CM | POA: Diagnosis not present

## 2017-01-18 DIAGNOSIS — I7103 Dissection of thoracoabdominal aorta: Secondary | ICD-10-CM | POA: Diagnosis not present

## 2017-01-18 DIAGNOSIS — I493 Ventricular premature depolarization: Secondary | ICD-10-CM | POA: Diagnosis present

## 2017-01-18 DIAGNOSIS — F419 Anxiety disorder, unspecified: Secondary | ICD-10-CM | POA: Diagnosis present

## 2017-01-18 DIAGNOSIS — I10 Essential (primary) hypertension: Secondary | ICD-10-CM

## 2017-01-18 DIAGNOSIS — Z7901 Long term (current) use of anticoagulants: Secondary | ICD-10-CM | POA: Diagnosis not present

## 2017-01-18 DIAGNOSIS — I132 Hypertensive heart and chronic kidney disease with heart failure and with stage 5 chronic kidney disease, or end stage renal disease: Secondary | ICD-10-CM | POA: Diagnosis present

## 2017-01-18 DIAGNOSIS — G6289 Other specified polyneuropathies: Secondary | ICD-10-CM | POA: Diagnosis not present

## 2017-01-18 DIAGNOSIS — I340 Nonrheumatic mitral (valve) insufficiency: Secondary | ICD-10-CM | POA: Diagnosis not present

## 2017-01-18 DIAGNOSIS — E162 Hypoglycemia, unspecified: Secondary | ICD-10-CM | POA: Diagnosis present

## 2017-01-18 DIAGNOSIS — R21 Rash and other nonspecific skin eruption: Secondary | ICD-10-CM

## 2017-01-18 DIAGNOSIS — G6281 Critical illness polyneuropathy: Secondary | ICD-10-CM | POA: Diagnosis not present

## 2017-01-18 DIAGNOSIS — G8929 Other chronic pain: Secondary | ICD-10-CM | POA: Diagnosis present

## 2017-01-18 LAB — BASIC METABOLIC PANEL
Anion gap: 18 — ABNORMAL HIGH (ref 5–15)
BUN: 48 mg/dL — AB (ref 6–20)
CALCIUM: 8.7 mg/dL — AB (ref 8.9–10.3)
CO2: 16 mmol/L — ABNORMAL LOW (ref 22–32)
Chloride: 102 mmol/L (ref 101–111)
Creatinine, Ser: 12.65 mg/dL — ABNORMAL HIGH (ref 0.61–1.24)
GFR calc Af Amer: 5 mL/min — ABNORMAL LOW (ref >60)
GFR, EST NON AFRICAN AMERICAN: 4 mL/min — AB (ref >60)
GLUCOSE: 86 mg/dL (ref 65–99)
Potassium: 4.6 mmol/L (ref 3.5–5.1)
Sodium: 136 mmol/L (ref 135–145)

## 2017-01-18 LAB — HEPATIC FUNCTION PANEL
ALBUMIN: 3.7 g/dL (ref 3.5–5.0)
ALT: 10 U/L — ABNORMAL LOW (ref 17–63)
AST: 18 U/L (ref 15–41)
Alkaline Phosphatase: 495 U/L — ABNORMAL HIGH (ref 38–126)
BILIRUBIN TOTAL: 1.3 mg/dL — AB (ref 0.3–1.2)
Bilirubin, Direct: 0.4 mg/dL (ref 0.1–0.5)
Indirect Bilirubin: 0.9 mg/dL (ref 0.3–0.9)
TOTAL PROTEIN: 7.2 g/dL (ref 6.5–8.1)

## 2017-01-18 LAB — CBC
HEMATOCRIT: 45.1 % (ref 39.0–52.0)
Hemoglobin: 14.3 g/dL (ref 13.0–17.0)
MCH: 30.4 pg (ref 26.0–34.0)
MCHC: 31.7 g/dL (ref 30.0–36.0)
MCV: 96 fL (ref 78.0–100.0)
Platelets: 102 10*3/uL — ABNORMAL LOW (ref 150–400)
RBC: 4.7 MIL/uL (ref 4.22–5.81)
RDW: 15.9 % — AB (ref 11.5–15.5)
WBC: 5.1 10*3/uL (ref 4.0–10.5)

## 2017-01-18 LAB — LIPASE, BLOOD: Lipase: 47 U/L (ref 11–51)

## 2017-01-18 LAB — I-STAT TROPONIN, ED
TROPONIN I, POC: 0.12 ng/mL — AB (ref 0.00–0.08)
Troponin i, poc: 0.09 ng/mL (ref 0.00–0.08)

## 2017-01-18 MED ORDER — SODIUM CHLORIDE 0.9 % IV SOLN: INTRAVENOUS | Status: DC

## 2017-01-18 MED ORDER — SODIUM CHLORIDE 0.9 % IV SOLN: 250.0000 mL | INTRAVENOUS | Status: DC | PRN

## 2017-01-18 MED ORDER — LABETALOL HCL 5 MG/ML IV SOLN
0.0000 mg | INTRAVENOUS | Status: DC | PRN
  Administered 2017-01-19: 10 mg via INTRAVENOUS
  Filled 2017-01-18 (×2): qty 4

## 2017-01-18 MED ORDER — LABETALOL HCL 5 MG/ML IV SOLN
10.0000 mg | Freq: Once | INTRAVENOUS | Status: AC
  Administered 2017-01-18: 10 mg via INTRAVENOUS
  Filled 2017-01-18: qty 4

## 2017-01-18 MED ORDER — IOPAMIDOL (ISOVUE-370) INJECTION 76%
INTRAVENOUS | Status: AC
  Administered 2017-01-18: 90 mL
  Filled 2017-01-18: qty 100

## 2017-01-18 NOTE — ED Notes (Signed)
Iv team at bedside  

## 2017-01-18 NOTE — Progress Notes (Signed)
ANTICOAGULATION CONSULT NOTE - Initial Consult  Pharmacy Consult for argatroban (warfarin on hold) Indication: atrial fibrillation (history of HIT)  Allergies  Allergen Reactions  . Heparin Other (See Comments)    UNSPECIFIED REACTION :  On Coumadin since 2004 ? ? HIT ? ?   Vital Signs: Temp: 97.8 F (36.6 C) (10/17 1401) Temp Source: Oral (10/17 1401) BP: 141/81 (10/17 2230) Pulse Rate: 88 (10/17 2230)  Labs:  Recent Labs  01/18/17 1401 01/19/17 0051  HGB 14.3  --   HCT 45.1  --   PLT 102*  --   APTT  --  56*  LABPROT  --  30.1*  INR  --  2.91  CREATININE 12.65*  --     CrCl cannot be calculated (Unknown ideal weight.).   Medical History: Past Medical History:  Diagnosis Date  . A-fib ([place])   . Arthritis    hands and shoulders  . Blindness and low vision    "Stargardt disease"  . Dissection of aorta ([place]) 2004  . Dysrhythmia    A-fib  . ESRD (end stage renal disease) ([place])   . Headache   . History of cardioversion 2014  . Hypertension   . Neuropathy   . Non-healing non-surgical wound 03/2016  . Paralysis ([place])    due to dissection of aorta in 2004, lower extremities  . Pneumonia     Assessment: 46 yo male with history of ESRD on HD admitted with chest pain. On warfarin PTA for history of afib with last dose on 10/16. Pharmacy consulted to dose argatroban (when INR <2) while warfarin on hold d/t history of HIT. New HIT panel sent per CCM.   Patient evaluated by CVTS d/t history of aortic dissection. Per Dr. Servando Snare note, non-operative at this time.   INR remains therapeutic on admission at 2.91. Will wait to initiate argatroban when INR <2.   PTA warfarin dose: 5mg  MonFri and 7.5 mg all other days.   Goal of Therapy:  aPTT range of 50-90 seconds Monitor platelets by anticoagulation protocol: Yes   Plan:  Start argatroban when INR <2 Daily INR F/u HIT panel   Lavonda Jumbo, PharmD Clinical Pharmacist 01/18/17 11:29 PM

## 2017-01-18 NOTE — ED Triage Notes (Signed)
Pt is dialysis pt who was unable to go to dialysis yesterday due to pain in his chest. Pt here today due to pain in center of chest.

## 2017-01-18 NOTE — Progress Notes (Signed)
Patient ID: Corey [name], male   DOB: March 25, 1971, 46 y.o.   MRN: [id number]      [address]       [place] [address]             [phone]        Corey [name] Medical Record #[id number] Date of Birth: 09-02-1970  Referring: Dr Tegeler Primary Care: Patient, No Pcp Per  Chief Complaint:    Chief Complaint  Patient presents with  . Chest Pain    History of Present Illness:     Asked by ER to review films on patient who comes to ER with week long history of chest discomfort, Troponin elevated . Patient gives history or "aortic dissection from top down" He notes he was operated on at the time, lost renal function dialysis, was comatose for several months in 2004. History from patient only have no confirmation or access to any old ct scans.    Current Activity/ Functional Status: Patient is not independent with mobility/ambulation, transfers, ADL's, IADL's.   Zubrod Score: At the time of surgery this patient's most appropriate activity status/level should be described as: []     0    Normal activity, no symptoms []     1    Restricted in physical strenuous activity but ambulatory, able to do out light work []     2    Ambulatory and capable of self care, unable to do work activities, up and about                 more than 50%  Of the time                            []     3    Only limited self care, in bed greater than 50% of waking hours []     4    Completely disabled, no self care, confined to bed or chair []     5    Moribund  Past Medical History:  Diagnosis Date  . A-fib ([facility])   . Arthritis    hands and shoulders  . Blindness and low vision    "Stargardt disease"  . Dissection of aorta ([facility]) 2004  . Dysrhythmia    A-fib  . ESRD (end stage renal disease) ([facility])   . Headache   . History of cardioversion 2014  . Hypertension   . Neuropathy   . Non-healing non-surgical wound 03/2016  . Paralysis ([facility])    due to dissection of aorta in 2004, lower  extremities  . Pneumonia     Past Surgical History:  Procedure Laterality Date  . APPLICATION OF WOUND VAC Left 04/13/2016   Procedure: APPLICATION OF WOUND VAC;  Surgeon: Conrad Reinbeck, MD;  Location: [facility];  Service: Vascular;  Laterality: Left;  . APPLICATION OF WOUND VAC Left 04/18/2016   Procedure: APPLICATION OF WOUND VAC;  Surgeon: Waynetta Sandy, MD;  Location: [facility];  Service: Vascular;  Laterality: Left;  Wound vac change   . APPLICATION OF WOUND VAC Left 04/20/2016   Procedure: WOUND VAC CHANGE;  Surgeon: Conrad Lagro, MD;  Location: [facility];  Service: Vascular;  Laterality: Left;  . AV FISTULA PLACEMENT    . BASCILIC VEIN TRANSPOSITION Left 12/09/2015   Procedure: FIRST STAGE BASILIC VEIN TRANSPOSITION LEFT UPPER ARM;  Surgeon: Conrad , MD;  Location: [facility];  Service: Vascular;  Laterality:  Left;  . BASCILIC VEIN TRANSPOSITION Left 03/09/2016   Procedure: SECOND STAGE BASILIC VEIN TRANSPOSITION WITH REVISION OF ANASTOMOSIS LEFT UPPER ARM;  Surgeon: Conrad Perry, MD;  Location: [facility];  Service: Vascular;  Laterality: Left;  . CARDIOVERSION    . REPAIR OF ACUTE ASCENDING THORACIC AORTIC DISSECTION    . REVISON OF ARTERIOVENOUS FISTULA Left 04/20/2016   Procedure: LIGATION OF BASILIC VEIN TRANSPOSITION;  Surgeon: Conrad Constantine, MD;  Location: [facility];  Service: Vascular;  Laterality: Left;  . WOUND DEBRIDEMENT Left 04/13/2016   Procedure: DEBRIDEMENT WOUND;  Surgeon: Conrad Deephaven, MD;  Location: [facility];  Service: Vascular;  Laterality: Left;    History  Smoking Status  . Never Smoker  Smokeless Tobacco  . Never Used   History  Alcohol Use No    Social History   Social History  . Marital status: Divorced    Spouse name: N/A  . Number of children: 2  . Years of education: N/A   Occupational History  . DIABLED    Social History Main Topics  . Smoking status: Never Smoker  . Smokeless tobacco: Never Used  . Alcohol use No  . Drug use: No  . Sexual activity: Not  on file   Other Topics Concern  . Not on file   Social History Narrative  . No narrative on file    Allergies  Allergen Reactions  . Heparin Other (See Comments)    UNSPECIFIED REACTION :  On Coumadin since 2004 ? ? HIT ? ?    No current facility-administered medications for this encounter.    Current Outpatient Prescriptions  Medication Sig Dispense Refill  . cinacalcet (SENSIPAR) 90 MG tablet Take 90 mg by mouth daily with supper.    . gabapentin (NEURONTIN) 100 MG capsule Take 1 capsule (100 mg total) by mouth 3 (three) times daily. (Patient taking differently: Take 100 mg by mouth 3 (three) times daily as needed (pain). ) 90 capsule 0  . labetalol (NORMODYNE) 300 MG tablet Take 300 mg by mouth 2 (two) times daily.    [name] RENVELA 800 MG tablet Take 800 mg by mouth 2 (two) times daily.  11  . warfarin (COUMADIN) 5 MG tablet TAKE AS DIRECTED BY COUMADIN CLINIC (Patient taking differently: TAKE 5MG  BY MOUTH ON MON & FRI AND 7.5MG  ON SUN/TUES/WED/THURS/SAT) 140 tablet 0     (Not in a hospital admission)  Family History  Problem Relation Age of Onset  . Cancer Mother   . Hypertension Mother   . Cancer Father   . Hypertension Father   . Thyroid disease Sister   . Stroke Brother        11     Review of Systems:  Pertinent items are noted in HPI.     Cardiac Review of Systems: Y or N  Chest Pain [    ]  Resting SOB [   ] Exertional SOB  [  ]  Orthopnea [  ]   Pedal Edema [   ]    Palpitations [  ] Syncope  [  ]   Presyncope [   ]  General Review of Systems: [Y] = yes [  ]=no Constitional: recent weight change [  ]; anorexia [  ]; fatigue [  ]; nausea [  ]; night sweats [  ]; fever [  ]; or chills [  ]  Dental: poor dentition[  ]; Last Dentist visit:   Eye : blurred vision [  ]; diplopia [   ]; vision changes [  ];  Amaurosis fugax[  ]; Resp: cough [  ];  wheezing[  ];  hemoptysis[  ]; shortness of breath[  ];  paroxysmal nocturnal dyspnea[  ]; dyspnea on exertion[  ]; or orthopnea[  ];  GI:  gallstones[  ], vomiting[  ];  dysphagia[  ]; melena[  ];  hematochezia [  ]; heartburn[  ];   Hx of  Colonoscopy[  ]; GU: kidney stones [  ]; hematuria[  ];   dysuria [  ];  nocturia[  ];  history of     obstruction [  ]; urinary frequency [  ]             Skin: rash, swelling[  ];, hair loss[  ];  peripheral edema[  ];  or itching[  ]; Musculosketetal: myalgias[  ];  joint swelling[  ];  joint erythema[  ];  joint pain[  ];  back pain[  ];  Heme/Lymph: bruising[  ];  bleeding[  ];  anemia[  ];  Neuro: TIA[  ];  headaches[  ];  stroke[  ];  vertigo[  ];  seizures[  ];   paresthesias[  ];  difficulty walking[  ];  Psych:depression[  ]; anxiety[  ];  Endocrine: diabetes[  ];  thyroid dysfunction[  ];  Immunizations: Flu [  ]; Pneumococcal[  ];  Other:  Physical Exam: BP (!) 154/97   Pulse 71   Temp 97.8 F (36.6 C) (Oral)   Resp 16   SpO2 95%    General appearance: alert, cooperative, appears older than stated age and no distress Head: Normocephalic, without obvious abnormality, atraumatic Neck: no adenopathy, no carotid bruit, no JVD, supple, symmetrical, trachea midline, thyroid not enlarged, symmetric, no tenderness/mass/nodules and right subclavian dialysis cath Lymph nodes: Cervical, supraclavicular, and axillary nodes normal. Resp: clear to auscultation bilaterally Back: symmetric, no curvature. ROM normal. No CVA tenderness. Cardio: regular rate and rhythm, S1, S2 normal, no murmur, click, rub or gallop GI: soft, non-tender; bowel sounds normal; no masses,  no organomegaly, midline abdominal scar  Extremities: both legs swollen with decreased function, patient can only walk short distance  Neurologic: decreased strength lower legs, chronic   Diagnostic Studies & Laboratory data:     Recent Radiology Findings:   Dg Chest 2 View  Result Date: 01/18/2017 CLINICAL DATA:  Chest pain.  Chronic  renal failure EXAM: CHEST  2 VIEW COMPARISON:  None. FINDINGS: Central catheter tip is at cavoatrial junction. No pneumothorax. There is atelectatic change in the right base region. There is slight interstitial edema. No airspace consolidation. Heart is upper normal in size with pulmonary vascularity within normal limits. No adenopathy. There is erosive change in both distal clavicles, likely due to chronic renal failure. There is a stent in the right radial region. IMPRESSION: Suspect a degree of volume overload from chronic renal failure. There is atelectatic change in the right base. No airspace consolidation. Central catheter tip at cavoatrial junction. No pneumothorax. No evident adenopathy. Electronically Signed   By: Lowella Grip III M.D.   On: 01/18/2017 14:53   Ct Angio Chest/abd/pel For Dissection W And/or Wo Contrast  Result Date: 01/18/2017 CLINICAL DATA:  Known history of dissection with increased chest pain EXAM: CT ANGIOGRAPHY CHEST, ABDOMEN AND PELVIS TECHNIQUE: Multidetector CT imaging through the chest, abdomen and pelvis was performed using  the standard protocol during bolus administration of intravenous contrast. Multiplanar reconstructed images and MIPs were obtained and reviewed to evaluate the vascular anatomy. CONTRAST:  90 mL Isovue 370. COMPARISON:  None. FINDINGS: CTA CHEST FINDINGS Cardiovascular: The ascending aorta is within normal limits without evidence of dissection or aneurysmal dilatation. No valvular replacement is noted. Just beyond the origin of the left subclavian artery there is evidence of a dissection involving the descending thoracic aorta and extending into the proximal abdominal aorta. Significant increased density is noted within the contrast in the false lumen related to poor flow. Some irregularity is noted distally although this is felt to be related to mixing of opacified and non-opacified blood. The thoracic aorta just beyond the intimal defect measures  approximately 4.2 cm in greatest dimension. Descending thoracic aorta measures approximately 4.1 cm. Normal tapering distally is noted. The true lumen is diminutive but patent with supply to the celiac axis, superior mesenteric artery and right renal artery. Postsurgical changes are noted just below the right renal artery consistent with the known history of repair. Mediastinum/Nodes: Thoracic inlet is within normal limits. No significant hilar or mediastinal adenopathy is noted. The esophagus as visualized is within normal limits. Dialysis catheter is noted on the right. Lungs/Pleura: Lungs are well aerated bilaterally. Mild basilar atelectasis is noted in the right middle and right lower lobes as well as in the left lower lobe. Some atelectatic changes are noted in the left lower lobe adjacent to the dilated aorta. Musculoskeletal: Some increased sclerosis is noted within the vertebral bodies consistent with the patient's known history of end-stage renal disease. No acute bony abnormality is noted. Review of the MIP images confirms the above findings. CTA ABDOMEN AND PELVIS FINDINGS VASCULAR Aorta: There changes consistent with a tube graft in the infrarenal abdominal aorta which is widely patent. Small focal outpouching is noted adjacent to the distal anastomotic site which may be related to prior chronic dissection. As previously described the false lumen an true lumen are opacified within the proximal abdominal aorta although the true lumen is diminutive in size. There is apparent fenestration distally just below the origin of the right renal artery. Celiac: The celiac axis is patent arising from the true lumen as described. No focal stenosis is noted. SMA: Widely patent arising from the distal aspect of the true lumen. Renals: Single right renal artery is noted arising from the distal aspect of the true lumen just prior to the distal fenestration. Left renal artery is not visualized and may have been related  to prior nephrectomy although may be related to congenital absence of the left kidney. IMA: The inferior mesenteric artery is patent arising just below the infrarenal aortic tube graft. Iliacs: Widely patent without aneurysmal dilatation. Mild atherosclerotic changes are noted. Veins: No definitive venous abnormality is noted. There is an IVC filter identified in the infrarenal IVC. Review of the MIP images confirms the above findings. NON-VASCULAR Hepatobiliary: The liver and gallbladder are within normal limits. Pancreas: Unremarkable. No pancreatic ductal dilatation or surrounding inflammatory changes. Spleen: Normal in size without focal abnormality. Adrenals/Urinary Tract: The left kidney is absent. Clinical correlation is recommended as to whether this is congenital or related to prior surgery. Multiple cysts are noted within the residual right kidney consistent with end-stage renal disease. No obstructive changes are noted. The bladder is decompressed. Stomach/Bowel: The appendix is within normal limits. No obstructive or inflammatory changes of the bowel are seen. Lymphatic: No significant lymphadenopathy is noted. Reproductive: Prostate is unremarkable. Other:  No abdominal wall hernia or abnormality. No abdominopelvic ascites. Musculoskeletal: Increased sclerosis in the bony structures is noted consistent with patient's known history of end-stage renal disease. Review of the MIP images confirms the above findings. IMPRESSION: Changes consistent with descending thoracic aortic dissection with apparent proximal and distal fenestrations. Slow flow is noted within the false lumen as described. Given the history of prior dissection and repair this is likely chronic in appearance although given the patient's current clinical symptomatology it would be difficult to exclude an acute component. If prior imaging related to the previous surgery could be obtained at comparison could be made. The true lumen gives supply  to the mesenteric vessels as described. No proximal aortic abnormality is noted. Changes consistent with end-stage renal disease. Polycystic changes of the right kidney are noted. Absent left kidney. It is uncertain whether this is postsurgical or related to congenital absence. Bilateral atelectatic changes. Critical Value/emergent results were called by telephone at the time of interpretation on 01/18/2017 at 5:35 pm to Dr. Marda Stalker , who verbally acknowledged these results. Electronically Signed   By: Inez Catalina M.D.   On: 01/18/2017 17:41     I have independently reviewed the above radiologic studies.  Recent Lab Findings: Lab Results  Component Value Date   WBC 5.1 01/18/2017   HGB 14.3 01/18/2017   HCT 45.1 01/18/2017   PLT 102 (L) 01/18/2017   GLUCOSE 86 01/18/2017   ALT 10 (L) 01/18/2017   AST 18 01/18/2017   NA 136 01/18/2017   K 4.6 01/18/2017   CL 102 01/18/2017   CREATININE 12.65 (H) 01/18/2017   BUN 48 (H) 01/18/2017   CO2 16 (L) 01/18/2017   TSH 1.430 08/31/2016   INR 2.4 12/30/2016      Assessment / Plan:    Patients history is complex and displaced in time and place , but appears he had type 3 aortic dissection in 2004 and had abdominal aortic infrarenal placed and a caval filter . No involvement of ascending aorta. Need old films to confirm this. At this point would treat the same way, acute or chronic type  III dissection , non operative-  with good BP control. Evaluate for elevated troponin           I  spent 25 minutes counseling the patient face to face and 50% or more the  time was spent in counseling and coordination of care. The total time spent in the appointment was 40 minutes.     Grace Isaac MD      [address] [place] [address] Office [phone]   [facility]eeper [phone]  01/18/2017 8:13 PM

## 2017-01-18 NOTE — ED Notes (Signed)
Patient transported to CT 

## 2017-01-18 NOTE — H&P (Addendum)
.. ..  Name: Corey [name] MRN: [id number] DOB: [birth year]    ADMISSION DATE:  01/18/2017 CONSULTATION DATE:  01/18/17  REFERRING MD :  Sherry Ruffing MD- ER  CHIEF COMPLAINT:  Chest pain radiating to his back  BRIEF PATIENT DESCRIPTION: 46 yr old male with PMHx of ESRD on TTS and h/o aortic dissection, and h/o ascending aneurysm s/p repair that presents with chest pain and elevated troponins. CT shows Acute on Chronic Type 3 dissection .     SIGNIFICANT EVENTS  Troponin  STUDIES:  CTA chest   HISTORY OF PRESENT ILLNESS:   46 yr old male with PMHx of ESRD on TTS, Afib, Calciphylaxis of LUE and h/o dissection that resulted in lower ext paralysis (wheelchair bound) and ascending aortic aneurysm.  Presents with complaints of chest pain which prevented patient from going to scheduled dialysis. He describes a heavy dull pain, 4-6/10 Moderate in nature that radiates to his back. He states he is compliant with his antihypertensive med. He takes Labetalol at home ( prev on Norvasc) BP ranges 130-140 sys at home. He denies any changes in his vision, fever, chills, sob, nausea, or vomiting. CT scan was performed and showed Acute on Chronic dissection. CTSx does not think the patient is acute or active in this pr Of note pt had an unspecified reaction to coumadin in 2004. Has a h/o being on argatroban in Jan 2018 ? HIT. Currently as an outpatient he is on schedule meds incl. 7.5mg  Coumadin on TTSS and 5mg  Coumadin on MWF. PAST MEDICAL HISTORY :   has a past medical history of A-fib ([place]); Arthritis; Blindness and low vision; Dissection of aorta ([place]) (2004); Dysrhythmia; ESRD (end stage renal disease) ([place]); Headache; History of cardioversion (2014); Hypertension; Neuropathy; Non-healing non-surgical wound (03/2016); Paralysis ([place]); and Pneumonia.  has a past surgical history that includes Repair of acute ascending thoracic aortic dissection; Cardioversion; AV fistula placement; Bascilic vein transposition  (Left, 12/09/2015); Bascilic vein transposition (Left, 03/09/2016); Wound debridement (Left, 04/13/2016); Application if wound vac (Left, 04/13/2016); Application if wound vac (Left, 04/18/2016); Application if wound vac (Left, 04/20/2016); and Revison of arteriovenous fistula (Left, 04/20/2016). Prior to Admission medications   Medication Sig Start Date End Date Taking? Authorizing Provider  cinacalcet (SENSIPAR) 90 MG tablet Take 90 mg by mouth daily with supper.   Yes [provider]  gabapentin (NEURONTIN) 100 MG capsule Take 1 capsule (100 mg total) by mouth 3 (three) times daily. Patient taking differently: Take 100 mg by mouth 3 (three) times daily as needed (pain).  04/27/16  Yes Virgina Jock A, PA-C  labetalol (NORMODYNE) 300 MG tablet Take 300 mg by mouth 2 (two) times daily.   Yes [provider]  RENVELA 800 MG tablet Take 800 mg by mouth 2 (two) times daily. 08/25/16  Yes [provider]  warfarin (COUMADIN) 5 MG tablet TAKE AS DIRECTED BY COUMADIN CLINIC Patient taking differently: TAKE 5MG  BY MOUTH ON MON & FRI AND 7.5MG  ON SUN/TUES/WED/THURS/SAT 11/18/16  Yes Jerline Pain, MD   Allergies  Allergen Reactions  . Heparin Other (See Comments)    UNSPECIFIED REACTION :  On Coumadin since 2004 ? ? HIT ? ?    FAMILY HISTORY:  family history includes Cancer in his father and mother; Hypertension in his father and mother; Stroke in his brother; Thyroid disease in his sister. SOCIAL HISTORY:  reports that he has never smoked. He has never used smokeless tobacco. He reports that he does not drink alcohol or use  drugs.  REVIEW OF SYSTEMS:  Bolded items are pertinent positives Constitutional: Negative for fever, chills, weight loss, malaise/fatigue and diaphoresis.  HENT: Negative for hearing loss, ear pain, nosebleeds, congestion, sore throat, neck pain, tinnitus and ear discharge.   Eyes: Negative for blurred vision, double vision, photophobia, pain, discharge and  redness.  Respiratory: Negative for cough, hemoptysis, sputum production, shortness of breath, wheezing and stridor.   Cardiovascular: Negative for chest pain, palpitations, orthopnea, claudication, leg swelling and PND.  Gastrointestinal: Negative for heartburn, nausea, vomiting, abdominal pain, diarrhea, constipation, blood in stool and melena.  Genitourinary: Negative for dysuria, urgency, frequency, hematuria and flank pain.  Musculoskeletal: Negative for myalgias, back pain, joint pain and falls.  Skin: Negative for itching and rash.  Neurological: Negative for dizziness, tingling, tremors, sensory change, speech change, focal weakness, seizures, loss of consciousness, weakness and headaches.  Endo/Heme/Allergies: Negative for environmental allergies and polydipsia. Does not bruise/bleed easily.  SUBJECTIVE:   VITAL SIGNS: Temp:  [97.8 F (36.6 C)] 97.8 F (36.6 C) (10/17 1401) Pulse Rate:  [67-89] 88 (10/17 2230) Resp:  [12-25] 16 (10/17 2230) BP: (129-168)/(79-106) 141/81 (10/17 2230) SpO2:  [91 %-100 %] 98 % (10/17 2230)  PHYSICAL EXAMINATION: General: well nourished male in no acute distress Neuro: AAOx3 with no focal deficits HEENT:normocephalic atraumatic wearing glasses EOM intact PERRL Cardiovascular: S1 and S2 appreciated  Lungs:  Clear to auscultation bilaterally Abdomen: soft hypoactive BS Musculoskeletal:  No obvious deformity Skin:  Dry no rashes    Recent Labs Lab 01/18/17 1401  NA 136  K 4.6  CL 102  CO2 16*  BUN 48*  CREATININE 12.65*  GLUCOSE 86    Recent Labs Lab 01/18/17 1401  HGB 14.3  HCT 45.1  WBC 5.1  PLT 102*   Dg Chest 2 View  Result Date: 01/18/2017 CLINICAL DATA:  Chest pain.  Chronic renal failure EXAM: CHEST  2 VIEW COMPARISON:  None. FINDINGS: Central catheter tip is at cavoatrial junction. No pneumothorax. There is atelectatic change in the right base region. There is slight interstitial edema. No airspace consolidation. Heart  is upper normal in size with pulmonary vascularity within normal limits. No adenopathy. There is erosive change in both distal clavicles, likely due to chronic renal failure. There is a stent in the right radial region. IMPRESSION: Suspect a degree of volume overload from chronic renal failure. There is atelectatic change in the right base. No airspace consolidation. Central catheter tip at cavoatrial junction. No pneumothorax. No evident adenopathy. Electronically Signed   By: Lowella Grip III M.D.   On: 01/18/2017 14:53   Ct Angio Chest/abd/pel For Dissection W And/or Wo Contrast  Result Date: 01/18/2017 CLINICAL DATA:  Known history of dissection with increased chest pain EXAM: CT ANGIOGRAPHY CHEST, ABDOMEN AND PELVIS TECHNIQUE: Multidetector CT imaging through the chest, abdomen and pelvis was performed using the standard protocol during bolus administration of intravenous contrast. Multiplanar reconstructed images and MIPs were obtained and reviewed to evaluate the vascular anatomy. CONTRAST:  90 mL Isovue 370. COMPARISON:  None. FINDINGS: CTA CHEST FINDINGS Cardiovascular: The ascending aorta is within normal limits without evidence of dissection or aneurysmal dilatation. No valvular replacement is noted. Just beyond the origin of the left subclavian artery there is evidence of a dissection involving the descending thoracic aorta and extending into the proximal abdominal aorta. Significant increased density is noted within the contrast in the false lumen related to poor flow. Some irregularity is noted distally although this is felt to be related  to mixing of opacified and non-opacified blood. The thoracic aorta just beyond the intimal defect measures approximately 4.2 cm in greatest dimension. Descending thoracic aorta measures approximately 4.1 cm. Normal tapering distally is noted. The true lumen is diminutive but patent with supply to the celiac axis, superior mesenteric artery and right renal  artery. Postsurgical changes are noted just below the right renal artery consistent with the known history of repair. Mediastinum/Nodes: Thoracic inlet is within normal limits. No significant hilar or mediastinal adenopathy is noted. The esophagus as visualized is within normal limits. Dialysis catheter is noted on the right. Lungs/Pleura: Lungs are well aerated bilaterally. Mild basilar atelectasis is noted in the right middle and right lower lobes as well as in the left lower lobe. Some atelectatic changes are noted in the left lower lobe adjacent to the dilated aorta. Musculoskeletal: Some increased sclerosis is noted within the vertebral bodies consistent with the patient's known history of end-stage renal disease. No acute bony abnormality is noted. Review of the MIP images confirms the above findings. CTA ABDOMEN AND PELVIS FINDINGS VASCULAR Aorta: There changes consistent with a tube graft in the infrarenal abdominal aorta which is widely patent. Small focal outpouching is noted adjacent to the distal anastomotic site which may be related to prior chronic dissection. As previously described the false lumen an true lumen are opacified within the proximal abdominal aorta although the true lumen is diminutive in size. There is apparent fenestration distally just below the origin of the right renal artery. Celiac: The celiac axis is patent arising from the true lumen as described. No focal stenosis is noted. SMA: Widely patent arising from the distal aspect of the true lumen. Renals: Single right renal artery is noted arising from the distal aspect of the true lumen just prior to the distal fenestration. Left renal artery is not visualized and may have been related to prior nephrectomy although may be related to congenital absence of the left kidney. IMA: The inferior mesenteric artery is patent arising just below the infrarenal aortic tube graft. Iliacs: Widely patent without aneurysmal dilatation. Mild  atherosclerotic changes are noted. Veins: No definitive venous abnormality is noted. There is an IVC filter identified in the infrarenal IVC. Review of the MIP images confirms the above findings. NON-VASCULAR Hepatobiliary: The liver and gallbladder are within normal limits. Pancreas: Unremarkable. No pancreatic ductal dilatation or surrounding inflammatory changes. Spleen: Normal in size without focal abnormality. Adrenals/Urinary Tract: The left kidney is absent. Clinical correlation is recommended as to whether this is congenital or related to prior surgery. Multiple cysts are noted within the residual right kidney consistent with end-stage renal disease. No obstructive changes are noted. The bladder is decompressed. Stomach/Bowel: The appendix is within normal limits. No obstructive or inflammatory changes of the bowel are seen. Lymphatic: No significant lymphadenopathy is noted. Reproductive: Prostate is unremarkable. Other: No abdominal wall hernia or abnormality. No abdominopelvic ascites. Musculoskeletal: Increased sclerosis in the bony structures is noted consistent with patient's known history of end-stage renal disease. Review of the MIP images confirms the above findings. IMPRESSION: Changes consistent with descending thoracic aortic dissection with apparent proximal and distal fenestrations. Slow flow is noted within the false lumen as described. Given the history of prior dissection and repair this is likely chronic in appearance although given the patient's current clinical symptomatology it would be difficult to exclude an acute component. If prior imaging related to the previous surgery could be obtained at comparison could be made. The true lumen gives supply  to the mesenteric vessels as described. No proximal aortic abnormality is noted. Changes consistent with end-stage renal disease. Polycystic changes of the right kidney are noted. Absent left kidney. It is uncertain whether this is  postsurgical or related to congenital absence. Bilateral atelectatic changes. Critical Value/emergent results were called by telephone at the time of interpretation on 01/18/2017 at 5:35 pm to Dr. Marda Stalker , who verbally acknowledged these results. Electronically Signed   By: Inez Catalina M.D.   On: 01/18/2017 17:41    ASSESSMENT / PLAN: 46 yr old male with PMHx dissection s/p repair of ascending aneurysm presents with chest pain and found to have Acute on Chronic Type 3 dissection  NEURO: Pain mgmt  GCS 15 Not on sedation or analgesia at this time Pt does take oxycodone at home No seizures and no focal deficits Prn morphine for moderate to severe pain Prn tylenol for mild pain  CARDIAC: Cardiac Panel BP trending down He currently takes labetalol 300mg  at home Last documented EF 65-70 with mild LVH EKG reviewed QTC 470 with several PVCs Prn labetalol pushes for BP control Goal sys<167mmHg  PULMONARY: No acute issues at this time Continue O2 supplemental with goal to keep Sats >92%  ID: No signs of an active infection No empiric abx indicated at this time  Endocrine: ESRD pt normally receives HD on TTS Missed last appt due to symptoms Consulting pt's renal MD for resumption of scheduled HD Hypoglycemic will start BG checks and D5 as needed  GI: NPO except for meds and ice chips PPI prophylaxis not indicated   Heme: Acute on Chronic Type 3 Dissection If Hgb<7 transfuse PRBCs .Marland KitchenA POS H/o HIT positive?  Send Pf4 Ab if positive confirm with SRA DVT PPx-> Agatroban per Pharmacy  RENAL ESRD on HD - TTS normal schedule Pt has very minimal UOP at baseline He remains compliant with HD e Will consult Renal MD - for patient to resume scheduled HD without interruption.. Lab Results  Component Value Date   CREATININE 12.65 (H) 01/18/2017   CREATININE 8.31 (H) 08/31/2016   CREATININE 10.77 (H) 04/30/2016  Resume pt's [facility]- outpt meds     I, Dr Seward Carol have personally reviewed patient's available data, including medical history, events of note, physical examination and test results as part of my evaluation. I have discussed with PA and other care providers such as pharmacist, RN and RRT. The patient is critically ill with multiple organ systems failure and requires high complexity decision making for assessment and support, frequent evaluation and titration of therapies, application of advanced monitoring technologies and extensive interpretation of multiple databases. Critical Care Time devoted to patient care services described in this note is 40 Minutes. This time reflects time of care of this signee Dr Seward Carol. This critical care time does not reflect procedure time, or teaching time or supervisory time of PA/NP/Med student/Med Resident etc but could involve care discussion time    DISPOSITION: ICU for BP management of Acute on Chronic Type 3 dissection  CC TIME: 40 mins PROGNOSIS: Guarded FAMILY: no family at bedside.  CODE STATUS: Full   Signed Dr Seward Carol [facility] [place]  01/18/2017, 11:44 PM

## 2017-01-18 NOTE — ED Notes (Signed)
ED Provider at bedside. 

## 2017-01-18 NOTE — ED Notes (Signed)
Dr. Sherry Ruffing notified of elevated trop

## 2017-01-18 NOTE — ED Provider Notes (Signed)
[facility] EMERGENCY DEPARTMENT Provider Note   CSN: [id number] Arrival date & time: 01/18/17  1354     History   Chief Complaint Chief Complaint  Patient presents with  . Chest Pain    HPI Corey [name] is a 46 y.o. male.   The history is provided by the patient and medical records. No language interpreter was used.  Chest Pain   This is a new problem. The current episode started more than 2 days ago. The problem occurs constantly. The problem has not changed since onset.The pain is present in the substernal region. The pain is at a severity of 4/10. The pain is moderate. The quality of the pain is described as heavy and dull. The pain radiates to the upper back. Duration of episode(s) is 6 days. Associated symptoms include back pain. Pertinent negatives include no abdominal pain, no cough, no diaphoresis, no dizziness, no exertional chest pressure, no fever, no headaches, no malaise/fatigue, no nausea, no near-syncope, no numbness, no palpitations, no shortness of breath, no sputum production, no syncope and no vomiting. He has tried nothing for the symptoms. The treatment provided no relief. Risk factors include male gender.  His past medical history is significant for aortic aneurysm and aortic dissection.    Past Medical History:  Diagnosis Date  . A-fib ([place])   . Arthritis    hands and shoulders  . Blindness and low vision    "Stargardt disease"  . Dissection of aorta ([place]) 2004  . Dysrhythmia    A-fib  . ESRD (end stage renal disease) ([place])   . Headache   . History of cardioversion 2014  . Hypertension   . Neuropathy   . Non-healing non-surgical wound 03/2016  . Paralysis ([place])    due to dissection of aorta in 2004, lower extremities  . Pneumonia     Patient Active Problem List   Diagnosis Date Noted  . Long term (current) use of anticoagulants [Z79.01] 12/30/2016  . Calciphylaxis 05/06/2016  . Arm wound, left, sequela 04/28/2016  .  Nonhealing surgical wound 04/13/2016  . ESRD on dialysis ([place]) 02/19/2016  . Atrial fibrillation ([place]) [I48.91] 12/23/2015    Past Surgical History:  Procedure Laterality Date  . APPLICATION OF WOUND VAC Left 04/13/2016   Procedure: APPLICATION OF WOUND VAC;  Surgeon: Conrad Conception Junction, MD;  Location: [facility];  Service: Vascular;  Laterality: Left;  . APPLICATION OF WOUND VAC Left 04/18/2016   Procedure: APPLICATION OF WOUND VAC;  Surgeon: Waynetta Sandy, MD;  Location: [facility];  Service: Vascular;  Laterality: Left;  Wound vac change   . APPLICATION OF WOUND VAC Left 04/20/2016   Procedure: WOUND VAC CHANGE;  Surgeon: Conrad Menlo Park, MD;  Location: [facility];  Service: Vascular;  Laterality: Left;  . AV FISTULA PLACEMENT    . BASCILIC VEIN TRANSPOSITION Left 12/09/2015   Procedure: FIRST STAGE BASILIC VEIN TRANSPOSITION LEFT UPPER ARM;  Surgeon: Conrad Plainwell, MD;  Location: [facility];  Service: Vascular;  Laterality: Left;  . BASCILIC VEIN TRANSPOSITION Left 03/09/2016   Procedure: SECOND STAGE BASILIC VEIN TRANSPOSITION WITH REVISION OF ANASTOMOSIS LEFT UPPER ARM;  Surgeon: Conrad Sherburne, MD;  Location: [facility];  Service: Vascular;  Laterality: Left;  . CARDIOVERSION    . REPAIR OF ACUTE ASCENDING THORACIC AORTIC DISSECTION    . REVISON OF ARTERIOVENOUS FISTULA Left 04/20/2016   Procedure: LIGATION OF BASILIC VEIN TRANSPOSITION;  Surgeon: Conrad Pomona, MD;  Location: [facility];  Service: Vascular;  Laterality: Left;  . WOUND DEBRIDEMENT Left 04/13/2016   Procedure: DEBRIDEMENT WOUND;  Surgeon: Conrad Vernon, MD;  Location: [facility];  Service: Vascular;  Laterality: Left;       Home Medications    Prior to Admission medications   Medication Sig Start Date End Date Taking? Authorizing Provider  cinacalcet (SENSIPAR) 90 MG tablet Take 90 mg by mouth daily with supper.    [provider]  gabapentin (NEURONTIN) 100 MG capsule Take 1 capsule (100 mg total) by mouth 3 (three) times daily. 04/27/16   [name], PA-C  labetalol (NORMODYNE) 300 MG tablet Take 300 mg by mouth 2 (two) times daily.    [provider]  RENVELA 800 MG tablet Take 800 mg by mouth 2 (two) times daily. 08/25/16   [provider]  warfarin (COUMADIN) 5 MG tablet TAKE AS DIRECTED BY COUMADIN CLINIC 11/18/16   Jerline Pain, MD    Family History Family History  Problem Relation Age of Onset  . Cancer Mother   . Hypertension Mother   . Cancer Father   . Hypertension Father   . Thyroid disease Sister   . Stroke Brother        53    Social History Social History  Substance Use Topics  . Smoking status: Never Smoker  . Smokeless tobacco: Never Used  . Alcohol use No     Allergies   Heparin   Review of Systems Review of Systems  Constitutional: Negative for chills, diaphoresis, fatigue, fever and malaise/fatigue.  HENT: Negative for congestion.   Respiratory: Negative for cough, sputum production, chest tightness, shortness of breath, wheezing and stridor.   Cardiovascular: Positive for chest pain. Negative for palpitations, syncope and near-syncope.  Gastrointestinal: Negative for abdominal pain, constipation, diarrhea, nausea and vomiting.  Genitourinary:       Patient reports making minimal urine at baseline.  Musculoskeletal: Positive for back pain. Negative for neck pain and neck stiffness.  Skin: Negative for rash and wound.  Neurological: Negative for dizziness, light-headedness, numbness and headaches.  Psychiatric/Behavioral: Negative for agitation.  All other systems reviewed and are negative.    Physical Exam Updated Vital Signs BP 129/90 (BP Location: Right Arm)   Pulse 72   Temp 97.8 F (36.6 C) (Oral)   Resp 16   SpO2 100%   Physical Exam  Constitutional: He appears well-developed and well-nourished. No distress.  HENT:  Head: Normocephalic and atraumatic.  Mouth/Throat: Oropharynx is clear and moist. No oropharyngeal exudate.  Eyes: Pupils are equal,  round, and reactive to light. Conjunctivae are normal.  Neck: Neck supple.  Cardiovascular: Normal rate, regular rhythm and intact distal pulses.   No murmur heard. Pulmonary/Chest: Effort normal and breath sounds normal. No stridor. No respiratory distress. He has no wheezes. He has no rales. He exhibits no tenderness.  Abdominal: Soft. There is no tenderness.  Musculoskeletal: He exhibits no edema or tenderness.       Back:  Neurological: He is alert. No sensory deficit. He exhibits normal muscle tone.  Skin: Skin is warm and dry. Capillary refill takes less than 2 seconds. Rash noted. He is not diaphoretic.  Psychiatric: He has a normal mood and affect. His behavior is normal.  Nursing note and vitals reviewed.    ED Treatments / Results  Labs (all labs ordered are listed, but only abnormal results are displayed) Labs Reviewed  BASIC METABOLIC PANEL - Abnormal; Notable for the following:  Result Value   CO2 16 (*)    BUN 48 (*)    Creatinine, Ser 12.65 (*)    Calcium 8.7 (*)    GFR calc non Af Amer 4 (*)    GFR calc Af Amer 5 (*)    Anion gap 18 (*)    All other components within normal limits  CBC - Abnormal; Notable for the following:    RDW 15.9 (*)    Platelets 102 (*)    All other components within normal limits  HEPATIC FUNCTION PANEL - Abnormal; Notable for the following:    ALT 10 (*)    Alkaline Phosphatase 495 (*)    Total Bilirubin 1.3 (*)    All other components within normal limits  CBC - Abnormal; Notable for the following:    RBC 4.02 (*)    Hemoglobin 12.2 (*)    HCT 38.1 (*)    RDW 15.8 (*)    Platelets 121 (*)    All other components within normal limits  BASIC METABOLIC PANEL - Abnormal; Notable for the following:    CO2 21 (*)    BUN 49 (*)    Creatinine, Ser 13.83 (*)    Calcium 8.5 (*)    GFR calc non Af Amer 4 (*)    GFR calc Af Amer 4 (*)    All other components within normal limits  PHOSPHORUS - Abnormal; Notable for the  following:    Phosphorus 6.1 (*)    All other components within normal limits  PROTIME-INR - Abnormal; Notable for the following:    Prothrombin Time 30.1 (*)    All other components within normal limits  APTT - Abnormal; Notable for the following:    aPTT 56 (*)    All other components within normal limits  PROTIME-INR - Abnormal; Notable for the following:    Prothrombin Time 30.1 (*)    All other components within normal limits  I-STAT TROPONIN, ED - Abnormal; Notable for the following:    Troponin i, poc 0.12 (*)    All other components within normal limits  I-STAT TROPONIN, ED - Abnormal; Notable for the following:    Troponin i, poc 0.09 (*)    All other components within normal limits  MRSA PCR SCREENING  LIPASE, BLOOD  MAGNESIUM  HIV ANTIBODY (ROUTINE TESTING)  HEPARIN INDUCED PLATELET AB (HIT ANTIBODY)    EKG  EKG Interpretation  Date/Time:  Wednesday January 18 2017 14:00:55 EDT Ventricular Rate:  80 PR Interval:  206 QRS Duration: 88 QT Interval:  408 QTC Calculation: 470 R Axis:   106 Text Interpretation:  Sinus rhythm with frequent Premature ventricular complexes Rightward axis Septal infarct , age undetermined Abnormal ECG When comapred to prior, possible T wave inversion in lead AVL compared to prior.  No STEMI Confirmed by Antony Blackbird [id number]) on 01/18/2017 3:06:42 PM       Radiology Dg Chest 2 View  Result Date: 01/18/2017 CLINICAL DATA:  Chest pain.  Chronic renal failure EXAM: CHEST  2 VIEW COMPARISON:  None. FINDINGS: Central catheter tip is at cavoatrial junction. No pneumothorax. There is atelectatic change in the right base region. There is slight interstitial edema. No airspace consolidation. Heart is upper normal in size with pulmonary vascularity within normal limits. No adenopathy. There is erosive change in both distal clavicles, likely due to chronic renal failure. There is a stent in the right radial region. IMPRESSION: Suspect a degree of  volume overload from chronic renal  failure. There is atelectatic change in the right base. No airspace consolidation. Central catheter tip at cavoatrial junction. No pneumothorax. No evident adenopathy. Electronically Signed   By: Lowella Grip III M.D.   On: 01/18/2017 14:53   Dg Chest Port 1 View  Result Date: 01/19/2017 CLINICAL DATA:  Chest pain. Respiratory failure. Aortic dissection. EXAM: PORTABLE CHEST 1 VIEW COMPARISON:  CT 01/18/2017.  Chest x-ray 01/18/2017. FINDINGS: Right IJ dual-lumen catheter in stable position with tip in right atrium. Aortic size appears stable. Cardiomegaly. Slight increase in pulmonary interstitial prominence noted suggesting CHF. Progressive atelectasis right lung base. Vascular stent right upper extremity. IMPRESSION: 1.  Right IJ dual-lumen catheter stable position. 2. Aortic size appears stable in this patient with known aortic dissection. 3. Cardiomegaly with increased bilateral pulmonary interstitial prominence consistent with CHF. 4.  Progressive right base atelectasis. Electronically Signed   By: Marcello Moores  Register   On: 01/19/2017 07:03   Ct Angio Chest/abd/pel For Dissection W And/or Wo Contrast  Result Date: 01/18/2017 CLINICAL DATA:  Known history of dissection with increased chest pain EXAM: CT ANGIOGRAPHY CHEST, ABDOMEN AND PELVIS TECHNIQUE: Multidetector CT imaging through the chest, abdomen and pelvis was performed using the standard protocol during bolus administration of intravenous contrast. Multiplanar reconstructed images and MIPs were obtained and reviewed to evaluate the vascular anatomy. CONTRAST:  90 mL Isovue 370. COMPARISON:  None. FINDINGS: CTA CHEST FINDINGS Cardiovascular: The ascending aorta is within normal limits without evidence of dissection or aneurysmal dilatation. No valvular replacement is noted. Just beyond the origin of the left subclavian artery there is evidence of a dissection involving the descending thoracic aorta and  extending into the proximal abdominal aorta. Significant increased density is noted within the contrast in the false lumen related to poor flow. Some irregularity is noted distally although this is felt to be related to mixing of opacified and non-opacified blood. The thoracic aorta just beyond the intimal defect measures approximately 4.2 cm in greatest dimension. Descending thoracic aorta measures approximately 4.1 cm. Normal tapering distally is noted. The true lumen is diminutive but patent with supply to the celiac axis, superior mesenteric artery and right renal artery. Postsurgical changes are noted just below the right renal artery consistent with the known history of repair. Mediastinum/Nodes: Thoracic inlet is within normal limits. No significant hilar or mediastinal adenopathy is noted. The esophagus as visualized is within normal limits. Dialysis catheter is noted on the right. Lungs/Pleura: Lungs are well aerated bilaterally. Mild basilar atelectasis is noted in the right middle and right lower lobes as well as in the left lower lobe. Some atelectatic changes are noted in the left lower lobe adjacent to the dilated aorta. Musculoskeletal: Some increased sclerosis is noted within the vertebral bodies consistent with the patient's known history of end-stage renal disease. No acute bony abnormality is noted. Review of the MIP images confirms the above findings. CTA ABDOMEN AND PELVIS FINDINGS VASCULAR Aorta: There changes consistent with a tube graft in the infrarenal abdominal aorta which is widely patent. Small focal outpouching is noted adjacent to the distal anastomotic site which may be related to prior chronic dissection. As previously described the false lumen an true lumen are opacified within the proximal abdominal aorta although the true lumen is diminutive in size. There is apparent fenestration distally just below the origin of the right renal artery. Celiac: The celiac axis is patent arising  from the true lumen as described. No focal stenosis is noted. SMA: Widely patent arising from the  distal aspect of the true lumen. Renals: Single right renal artery is noted arising from the distal aspect of the true lumen just prior to the distal fenestration. Left renal artery is not visualized and may have been related to prior nephrectomy although may be related to congenital absence of the left kidney. IMA: The inferior mesenteric artery is patent arising just below the infrarenal aortic tube graft. Iliacs: Widely patent without aneurysmal dilatation. Mild atherosclerotic changes are noted. Veins: No definitive venous abnormality is noted. There is an IVC filter identified in the infrarenal IVC. Review of the MIP images confirms the above findings. NON-VASCULAR Hepatobiliary: The liver and gallbladder are within normal limits. Pancreas: Unremarkable. No pancreatic ductal dilatation or surrounding inflammatory changes. Spleen: Normal in size without focal abnormality. Adrenals/Urinary Tract: The left kidney is absent. Clinical correlation is recommended as to whether this is congenital or related to prior surgery. Multiple cysts are noted within the residual right kidney consistent with end-stage renal disease. No obstructive changes are noted. The bladder is decompressed. Stomach/Bowel: The appendix is within normal limits. No obstructive or inflammatory changes of the bowel are seen. Lymphatic: No significant lymphadenopathy is noted. Reproductive: Prostate is unremarkable. Other: No abdominal wall hernia or abnormality. No abdominopelvic ascites. Musculoskeletal: Increased sclerosis in the bony structures is noted consistent with patient's known history of end-stage renal disease. Review of the MIP images confirms the above findings. IMPRESSION: Changes consistent with descending thoracic aortic dissection with apparent proximal and distal fenestrations. Slow flow is noted within the false lumen as described.  Given the history of prior dissection and repair this is likely chronic in appearance although given the patient's current clinical symptomatology it would be difficult to exclude an acute component. If prior imaging related to the previous surgery could be obtained at comparison could be made. The true lumen gives supply to the mesenteric vessels as described. No proximal aortic abnormality is noted. Changes consistent with end-stage renal disease. Polycystic changes of the right kidney are noted. Absent left kidney. It is uncertain whether this is postsurgical or related to congenital absence. Bilateral atelectatic changes. Critical Value/emergent results were called by telephone at the time of interpretation on 01/18/2017 at 5:35 pm to Dr. Marda Stalker , who verbally acknowledged these results. Electronically Signed   By: Inez Catalina M.D.   On: 01/18/2017 17:41    Procedures Procedures (including critical care time)  CRITICAL CARE Performed by: Gwenyth Allegra Mitesh Rosendahl Total critical care time: 35 minutes Critical care time was exclusive of separately billable procedures and treating other patients. ICU admission for possible acute on chronic Aortic dissection with Critical care and CT surgery consultation. Needed IV BP meds.  Critical care was necessary to treat or prevent imminent or life-threatening deterioration. Critical care was time spent personally by me on the following activities: development of treatment plan with patient and/or surrogate as well as nursing, discussions with consultants, evaluation of patient's response to treatment, examination of patient, obtaining history from patient or surrogate, ordering and performing treatments and interventions, ordering and review of laboratory studies, ordering and review of radiographic studies, pulse oximetry and re-evaluation of patient's condition.   Medications Ordered in ED Medications  0.9 %  sodium chloride infusion (not  administered)  labetalol (NORMODYNE,TRANDATE) injection 0-20 mg (10 mg Intravenous Given 01/19/17 0201)  sevelamer carbonate (RENVELA) tablet 800 mg (800 mg Oral Not Given 01/19/17 0830)  sodium chloride flush (NS) 0.9 % injection 3 mL (not administered)  sodium chloride flush (NS) 0.9 %  injection 3 mL (not administered)  0.9 %  sodium chloride infusion (not administered)  labetalol (NORMODYNE) tablet 100 mg (not administered)  acetaminophen (TYLENOL) tablet 650 mg (not administered)  oxyCODONE (Oxy IR/ROXICODONE) immediate release tablet 5 mg (not administered)  MEDLINE mouth rinse (not administered)  iopamidol (ISOVUE-370) 76 % injection (90 mLs  Contrast Given 01/18/17 1641)  labetalol (NORMODYNE,TRANDATE) injection 10 mg (10 mg Intravenous Given 01/18/17 2231)     Initial Impression / Assessment and Plan / ED Course  I have reviewed the triage vital signs and the nursing notes.  Pertinent labs & imaging results that were available during my care of the patient were reviewed by me and considered in my medical decision making (see chart for details).     Corey [name] is a 46 y.o. male with a past medical history significant for ESRD on dialysis TTS, prior aortic dissection and ascending aortic aneurysm status post repair, atrial fibrillation on Coumadin therapy, and hypertension who presents with chest pain and back pain. Patient reports that for the last 6 days he has had intermittent chest pain. He describes it as an aching that radiates from his back to his chest. He describes it as severe and a 6 out of 10 in severity at its worst. He reports is currently 4/10. Patient says that rest makes it better but he does not feel it is associated with exertion. He denies shortness of breath. He denies direct pain. He denies nausea, vomiting, diaphoresis. He denies recent traumatic injuries. He does not has any leg swelling or feeling fluid overloaded. He reports that he did not take dialysis  yesterday as normal because he was feeling such chest pain. He was going to come in yesterday but ended up taking a nap and feeling slightly better so we held off.  Patient is unsure if this pain feels like his previous aortic trouble. He denies any constipation, diarrhea and says that he does not make much urine anymore. He denies fevers, chills, congestion, or cough.  On exam, lungs are clear. Chest and back are nontender. Patient does appear to have a vesicular rash in his left back that radiates around towards his chest that may be shingle-like in appearance. Patient denies any history of shingles. Patient's abdomen is nontender. Patient has symmetric pulses in upper extremities. Mild lower extremity edema which patient reports is unchanged from prior. Patient has bilateral lower extremity neuropathies.  Due to patient's history of aortic dissection and repair in the setting of chest pain radiating towards the back, patient will have CT imaging to evaluate for dissection. Patient's EKG did show T-wave inversion in aVL which appears new from prior. Patient also had an initial troponin that was positive at 0.12. No leukocytosis or anemia. Thrombocytopenia present at 102. Potassium is 4.6. CO2 decreased at 16 and creatinine elevated at 12.65. Glucose nonelevated.  As patient is on Coumadin, INR was checked. Due to the pain in the upper abdomen and low chest, patient will have hepatic function and lipase checked.  Anticipate patient will require admission due to the chest pain and back pain with a positive troponin and EKG change.  Will attempt to rule out aortic change initially.Pain may also be related to possible shingles however given patient's history and positive troponin, imaging was felt necessary.   5:43 PM Diagnostic testing results are seen above. 6 radiology called report the patient's CT scan continued to show dissection however they were unable to determine if there was any new  abnormality  or injury without prior imaging to compare. Patient reports that his imaging was done in 2000 for up in [place]. Radiology recommended speaking with cardiothoracic surgery. There being called for recommendations.  CT surgery recommends admission to ICU for BP monitoring and CP workup. They will follow.   IV labetalol ordered for HTN. Continue to suspect symptoms could be from shingles, but due to positive troponin and his history, agree with admission.   PT admitted in stable condition.    Final Clinical Impressions(s) / ED Diagnoses   Final diagnoses:  Precordial pain  Thoracoabdominal aortic aneurysm (TAAA) without rupture ([facility])  Rash and nonspecific skin eruption  Elevated troponin    Clinical Impression: 1. Precordial pain   2. Thoracoabdominal aortic aneurysm (TAAA) without rupture ([place])   3. Rash and nonspecific skin eruption   4. Elevated troponin     Disposition: Admit to Critical care   Rufus Beske, Gwenyth Allegra, MD 01/19/17 1217

## 2017-01-18 NOTE — ED Notes (Signed)
Critical Lab results reported to Triage Nurse.

## 2017-01-19 ENCOUNTER — Inpatient Hospital Stay (HOSPITAL_COMMUNITY): Payer: Medicare Other

## 2017-01-19 DIAGNOSIS — I716 Thoracoabdominal aortic aneurysm, without rupture, unspecified: Secondary | ICD-10-CM | POA: Insufficient documentation

## 2017-01-19 DIAGNOSIS — I7100 Dissection of unspecified site of aorta: Secondary | ICD-10-CM

## 2017-01-19 DIAGNOSIS — I340 Nonrheumatic mitral (valve) insufficiency: Secondary | ICD-10-CM

## 2017-01-19 LAB — PROTIME-INR
INR: 2.91
INR: 2.9
PROTHROMBIN TIME: 30.1 s — AB (ref 11.4–15.2)
Prothrombin Time: 30.1 seconds — ABNORMAL HIGH (ref 11.4–15.2)

## 2017-01-19 LAB — CBC
HCT: 38.1 % — ABNORMAL LOW (ref 39.0–52.0)
Hemoglobin: 12.2 g/dL — ABNORMAL LOW (ref 13.0–17.0)
MCH: 30.3 pg (ref 26.0–34.0)
MCHC: 32 g/dL (ref 30.0–36.0)
MCV: 94.8 fL (ref 78.0–100.0)
PLATELETS: 121 10*3/uL — AB (ref 150–400)
RBC: 4.02 MIL/uL — AB (ref 4.22–5.81)
RDW: 15.8 % — ABNORMAL HIGH (ref 11.5–15.5)
WBC: 5.1 10*3/uL (ref 4.0–10.5)

## 2017-01-19 LAB — HIV ANTIBODY (ROUTINE TESTING W REFLEX): HIV SCREEN 4TH GENERATION: NONREACTIVE

## 2017-01-19 LAB — BASIC METABOLIC PANEL
ANION GAP: 15 (ref 5–15)
BUN: 49 mg/dL — ABNORMAL HIGH (ref 6–20)
CO2: 21 mmol/L — ABNORMAL LOW (ref 22–32)
Calcium: 8.5 mg/dL — ABNORMAL LOW (ref 8.9–10.3)
Chloride: 103 mmol/L (ref 101–111)
Creatinine, Ser: 13.83 mg/dL — ABNORMAL HIGH (ref 0.61–1.24)
GFR calc Af Amer: 4 mL/min — ABNORMAL LOW (ref >60)
GFR, EST NON AFRICAN AMERICAN: 4 mL/min — AB (ref >60)
GLUCOSE: 87 mg/dL (ref 65–99)
POTASSIUM: 3.6 mmol/L (ref 3.5–5.1)
Sodium: 139 mmol/L (ref 135–145)

## 2017-01-19 LAB — ECHOCARDIOGRAM COMPLETE
Height: 74 in
Weight: 2885.38 oz

## 2017-01-19 LAB — APTT: aPTT: 56 seconds — ABNORMAL HIGH (ref 24–36)

## 2017-01-19 LAB — MAGNESIUM: MAGNESIUM: 2 mg/dL (ref 1.7–2.4)

## 2017-01-19 LAB — PHOSPHORUS: PHOSPHORUS: 6.1 mg/dL — AB (ref 2.5–4.6)

## 2017-01-19 LAB — MRSA PCR SCREENING: MRSA by PCR: NEGATIVE

## 2017-01-19 MED ORDER — ORAL CARE MOUTH RINSE
15.0000 mL | Freq: Two times a day (BID) | OROMUCOSAL | Status: DC
  Administered 2017-01-19 – 2017-01-25 (×5): 15 mL via OROMUCOSAL

## 2017-01-19 MED ORDER — SODIUM CHLORIDE 0.9 % IV SOLN
250.0000 mL | INTRAVENOUS | Status: DC | PRN
  Administered 2017-01-21: 250 mL via INTRAVENOUS

## 2017-01-19 MED ORDER — ACETAMINOPHEN 325 MG PO TABS
650.0000 mg | ORAL_TABLET | Freq: Four times a day (QID) | ORAL | Status: DC | PRN
  Administered 2017-01-20 – 2017-01-21 (×3): 650 mg via ORAL
  Filled 2017-01-19 (×3): qty 2

## 2017-01-19 MED ORDER — SODIUM CHLORIDE 0.9% FLUSH
3.0000 mL | Freq: Two times a day (BID) | INTRAVENOUS | Status: DC
Start: 2017-01-19 — End: 2017-01-23
  Administered 2017-01-19 – 2017-01-22 (×6): 3 mL via INTRAVENOUS

## 2017-01-19 MED ORDER — SODIUM CHLORIDE 0.9% FLUSH: 3.0000 mL | INTRAVENOUS | Status: DC | PRN

## 2017-01-19 MED ORDER — OXYCODONE HCL 5 MG PO TABS
5.0000 mg | ORAL_TABLET | Freq: Four times a day (QID) | ORAL | Status: DC | PRN
  Administered 2017-01-19 – 2017-01-23 (×7): 5 mg via ORAL
  Filled 2017-01-19 (×7): qty 1

## 2017-01-19 MED ORDER — LABETALOL HCL 100 MG PO TABS
100.0000 mg | ORAL_TABLET | Freq: Two times a day (BID) | ORAL | Status: DC
  Administered 2017-01-19 – 2017-01-20 (×3): 100 mg via ORAL
  Filled 2017-01-19 (×3): qty 1

## 2017-01-19 MED ORDER — SEVELAMER CARBONATE 800 MG PO TABS
800.0000 mg | ORAL_TABLET | Freq: Three times a day (TID) | ORAL | Status: DC
  Administered 2017-01-19 – 2017-01-21 (×5): 800 mg via ORAL
  Filled 2017-01-19 (×5): qty 1

## 2017-01-19 NOTE — Consult Note (Signed)
[facility] Renal Consultation Note    Indication for Consultation:  Management of ESRD/hemodialysis; anemia, hypertension/volume and secondary hyperparathyroidism  ION:[id number], No Pcp Per  HPI: Corey [name] is a 46 y.o. male. ESRD 2/2 HTN and aortic dissection, on HD TTS, at [facility], first starting in 2004 in [place], transferring to [facility] in 2017.  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   Patient admitted for evaluation and management of acute on chronic aortic dissection.  Seen and examined at bedside. Patient presented to the ED 1 day ago with complaints of worsening chest pain for the last 4 days.  States pain is currently 3/10, and radiating to his back.  Denies n/v/d, shortness of breath, abdominal pain, weakness and edema.  States he missed HD on Tuesday due to his current symptoms.   Pertinent findings include CT angio findings indicating descending thoracic aortic dissection with apparent proximal and distal fenestrations, mildly elevated troponin 0.12>0.09, CXR indicates stable aortic size, and interstitial edema.    Past Medical History:  Diagnosis Date  . A-fib ([place])   . Arthritis    hands and shoulders  . Blindness and low vision    "Stargardt disease"  . Dissection of aorta ([place]) 2004  . Dysrhythmia    A-fib  . ESRD (end stage renal disease) ([place])   . Headache   . History of cardioversion 2014  . Hypertension   . Neuropathy   . Non-healing non-surgical wound 03/2016  . Paralysis ([place])    due to dissection of aorta in 2004, lower extremities  . Pneumonia    Past Surgical History:  Procedure Laterality Date  . APPLICATION OF WOUND VAC Left 04/13/2016   Procedure: APPLICATION OF WOUND VAC;  Surgeon: Conrad Verlot, MD;  Location: [place];  Service: Vascular;  Laterality: Left;  . APPLICATION OF WOUND VAC  Left 04/18/2016   Procedure: APPLICATION OF WOUND VAC;  Surgeon: Waynetta Sandy, MD;  Location: [place];  Service: Vascular;  Laterality: Left;  Wound vac change   . APPLICATION OF WOUND VAC Left 04/20/2016   Procedure: WOUND VAC CHANGE;  Surgeon: Conrad Quincy, MD;  Location: [place];  Service: Vascular;  Laterality: Left;  . AV FISTULA PLACEMENT    . BASCILIC VEIN TRANSPOSITION Left 12/09/2015   Procedure: FIRST STAGE BASILIC VEIN TRANSPOSITION LEFT UPPER ARM;  Surgeon: Conrad Oakbrook Terrace, MD;  Location: [place];  Service: Vascular;  Laterality: Left;  . BASCILIC VEIN TRANSPOSITION Left 03/09/2016   Procedure: SECOND STAGE BASILIC VEIN TRANSPOSITION WITH REVISION OF ANASTOMOSIS LEFT UPPER ARM;  Surgeon: Conrad Goodville, MD;  Location: [place];  Service: Vascular;  Laterality: Left;  . CARDIOVERSION    . REPAIR OF ACUTE ASCENDING THORACIC AORTIC DISSECTION    . REVISON OF ARTERIOVENOUS FISTULA Left 04/20/2016   Procedure: LIGATION OF BASILIC VEIN TRANSPOSITION;  Surgeon: Conrad McClelland, MD;  Location: [place];  Service: Vascular;  Laterality: Left;  . WOUND DEBRIDEMENT Left 04/13/2016   Procedure: DEBRIDEMENT WOUND;  Surgeon: Conrad Layton, MD;  Location: [place];  Service: Vascular;  Laterality: Left;   Family History  Problem Relation Age of Onset  . Cancer Mother   . Hypertension Mother   . Cancer Father   . Hypertension Father   . Thyroid disease Sister   . Stroke Brother  11   Social History:  reports that he has never smoked. He has never used smokeless tobacco. He reports that he does not drink alcohol or use drugs. Allergies  Allergen Reactions  . Heparin Other (See Comments)    UNSPECIFIED REACTION :  On Coumadin since 2004 ? ? HIT ? ?   Prior to Admission medications   Medication Sig Start Date End Date Taking? Authorizing Provider  cinacalcet (SENSIPAR) 90 MG tablet Take 90 mg by mouth daily with supper.   Yes [provider]  gabapentin (NEURONTIN) 100 MG capsule Take 1  capsule (100 mg total) by mouth 3 (three) times daily. Patient taking differently: Take 100 mg by mouth 3 (three) times daily as needed (pain).  04/27/16  Yes Virgina Jock A, PA-C  labetalol (NORMODYNE) 300 MG tablet Take 300 mg by mouth 2 (two) times daily.   Yes [provider]  RENVELA 800 MG tablet Take 800 mg by mouth 2 (two) times daily. 08/25/16  Yes [provider]  warfarin (COUMADIN) 5 MG tablet TAKE AS DIRECTED BY COUMADIN CLINIC Patient taking differently: TAKE 5MG  BY MOUTH ON MON & FRI AND 7.5MG  ON SUN/TUES/WED/THURS/SAT 11/18/16  Yes Jerline Pain, MD   Current Facility-Administered Medications  Medication Dose Route Frequency Provider Last Rate Last Dose  . 0.9 %  sodium chloride infusion  250 mL Intravenous PRN Desai, Rahul P, PA-C      . 0.9 %  sodium chloride infusion  250 mL Intravenous PRN Scatliffe, Kristen D, MD      . acetaminophen (TYLENOL) tablet 650 mg  650 mg Oral Q6H PRN Javier Glazier, MD      . labetalol (NORMODYNE) tablet 100 mg  100 mg Oral BID Javier Glazier, MD   100 mg at 01/19/17 1257  . labetalol (NORMODYNE,TRANDATE) injection 0-20 mg  0-20 mg Intravenous Q10 min PRN Shearon Stalls, Rahul P, PA-C   10 mg at 01/19/17 0201  . MEDLINE mouth rinse  15 mL Mouth Rinse BID Javier Glazier, MD      . oxyCODONE (Oxy IR/ROXICODONE) immediate release tablet 5 mg  5 mg Oral Q6H PRN Javier Glazier, MD      . sevelamer carbonate (RENVELA) tablet 800 mg  800 mg Oral TID WC Scatliffe, Rise Paganini, MD   800 mg at 01/19/17 1257  . sodium chloride flush (NS) 0.9 % injection 3 mL  3 mL Intravenous Q12H Scatliffe, Kristen D, MD      . sodium chloride flush (NS) 0.9 % injection 3 mL  3 mL Intravenous PRN Scatliffe, Rise Paganini, MD       Labs: Basic Metabolic Panel:  Recent Labs Lab 01/18/17 1401 01/19/17 0158  NA 136 139  K 4.6 3.6  CL 102 103  CO2 16* 21*  GLUCOSE 86 87  BUN 48* 49*  CREATININE 12.65* 13.83*  CALCIUM 8.7* 8.5*  PHOS  --  6.1*    Liver Function Tests:  Recent Labs Lab 01/18/17 1401  AST 18  ALT 10*  ALKPHOS 495*  BILITOT 1.3*  PROT 7.2  ALBUMIN 3.7    Recent Labs Lab 01/18/17 1401  LIPASE 47   CBC:  Recent Labs Lab 01/18/17 1401 01/19/17 0158  WBC 5.1 5.1  HGB 14.3 12.2*  HCT 45.1 38.1*  MCV 96.0 94.8  PLT 102* 121*   Studies/Results: Dg Chest 2 View  Result Date: 01/18/2017 CLINICAL DATA:  Chest pain.  Chronic renal failure EXAM: CHEST  2 VIEW  COMPARISON:  None. FINDINGS: Central catheter tip is at cavoatrial junction. No pneumothorax. There is atelectatic change in the right base region. There is slight interstitial edema. No airspace consolidation. Heart is upper normal in size with pulmonary vascularity within normal limits. No adenopathy. There is erosive change in both distal clavicles, likely due to chronic renal failure. There is a stent in the right radial region. IMPRESSION: Suspect a degree of volume overload from chronic renal failure. There is atelectatic change in the right base. No airspace consolidation. Central catheter tip at cavoatrial junction. No pneumothorax. No evident adenopathy. Electronically Signed   By: Lowella Grip III M.D.   On: 01/18/2017 14:53   Dg Chest Port 1 View  Result Date: 01/19/2017 CLINICAL DATA:  Chest pain. Respiratory failure. Aortic dissection. EXAM: PORTABLE CHEST 1 VIEW COMPARISON:  CT 01/18/2017.  Chest x-ray 01/18/2017. FINDINGS: Right IJ dual-lumen catheter in stable position with tip in right atrium. Aortic size appears stable. Cardiomegaly. Slight increase in pulmonary interstitial prominence noted suggesting CHF. Progressive atelectasis right lung base. Vascular stent right upper extremity. IMPRESSION: 1.  Right IJ dual-lumen catheter stable position. 2. Aortic size appears stable in this patient with known aortic dissection. 3. Cardiomegaly with increased bilateral pulmonary interstitial prominence consistent with CHF. 4.  Progressive right  base atelectasis. Electronically Signed   By: Marcello Moores  Register   On: 01/19/2017 07:03   Ct Angio Chest/abd/pel For Dissection W And/or Wo Contrast  Result Date: 01/18/2017 CLINICAL DATA:  Known history of dissection with increased chest pain EXAM: CT ANGIOGRAPHY CHEST, ABDOMEN AND PELVIS TECHNIQUE: Multidetector CT imaging through the chest, abdomen and pelvis was performed using the standard protocol during bolus administration of intravenous contrast. Multiplanar reconstructed images and MIPs were obtained and reviewed to evaluate the vascular anatomy. CONTRAST:  90 mL Isovue 370. COMPARISON:  None. FINDINGS: CTA CHEST FINDINGS Cardiovascular: The ascending aorta is within normal limits without evidence of dissection or aneurysmal dilatation. No valvular replacement is noted. Just beyond the origin of the left subclavian artery there is evidence of a dissection involving the descending thoracic aorta and extending into the proximal abdominal aorta. Significant increased density is noted within the contrast in the false lumen related to poor flow. Some irregularity is noted distally although this is felt to be related to mixing of opacified and non-opacified blood. The thoracic aorta just beyond the intimal defect measures approximately 4.2 cm in greatest dimension. Descending thoracic aorta measures approximately 4.1 cm. Normal tapering distally is noted. The true lumen is diminutive but patent with supply to the celiac axis, superior mesenteric artery and right renal artery. Postsurgical changes are noted just below the right renal artery consistent with the known history of repair. Mediastinum/Nodes: Thoracic inlet is within normal limits. No significant hilar or mediastinal adenopathy is noted. The esophagus as visualized is within normal limits. Dialysis catheter is noted on the right. Lungs/Pleura: Lungs are well aerated bilaterally. Mild basilar atelectasis is noted in the right middle and right lower  lobes as well as in the left lower lobe. Some atelectatic changes are noted in the left lower lobe adjacent to the dilated aorta. Musculoskeletal: Some increased sclerosis is noted within the vertebral bodies consistent with the patient's known history of end-stage renal disease. No acute bony abnormality is noted. Review of the MIP images confirms the above findings. CTA ABDOMEN AND PELVIS FINDINGS VASCULAR Aorta: There changes consistent with a tube graft in the infrarenal abdominal aorta which is widely patent. Small focal outpouching is  noted adjacent to the distal anastomotic site which may be related to prior chronic dissection. As previously described the false lumen an true lumen are opacified within the proximal abdominal aorta although the true lumen is diminutive in size. There is apparent fenestration distally just below the origin of the right renal artery. Celiac: The celiac axis is patent arising from the true lumen as described. No focal stenosis is noted. SMA: Widely patent arising from the distal aspect of the true lumen. Renals: Single right renal artery is noted arising from the distal aspect of the true lumen just prior to the distal fenestration. Left renal artery is not visualized and may have been related to prior nephrectomy although may be related to congenital absence of the left kidney. IMA: The inferior mesenteric artery is patent arising just below the infrarenal aortic tube graft. Iliacs: Widely patent without aneurysmal dilatation. Mild atherosclerotic changes are noted. Veins: No definitive venous abnormality is noted. There is an IVC filter identified in the infrarenal IVC. Review of the MIP images confirms the above findings. NON-VASCULAR Hepatobiliary: The liver and gallbladder are within normal limits. Pancreas: Unremarkable. No pancreatic ductal dilatation or surrounding inflammatory changes. Spleen: Normal in size without focal abnormality. Adrenals/Urinary Tract: The left  kidney is absent. Clinical correlation is recommended as to whether this is congenital or related to prior surgery. Multiple cysts are noted within the residual right kidney consistent with end-stage renal disease. No obstructive changes are noted. The bladder is decompressed. Stomach/Bowel: The appendix is within normal limits. No obstructive or inflammatory changes of the bowel are seen. Lymphatic: No significant lymphadenopathy is noted. Reproductive: Prostate is unremarkable. Other: No abdominal wall hernia or abnormality. No abdominopelvic ascites. Musculoskeletal: Increased sclerosis in the bony structures is noted consistent with patient's known history of end-stage renal disease. Review of the MIP images confirms the above findings. IMPRESSION: Changes consistent with descending thoracic aortic dissection with apparent proximal and distal fenestrations. Slow flow is noted within the false lumen as described. Given the history of prior dissection and repair this is likely chronic in appearance although given the patient's current clinical symptomatology it would be difficult to exclude an acute component. If prior imaging related to the previous surgery could be obtained at comparison could be made. The true lumen gives supply to the mesenteric vessels as described. No proximal aortic abnormality is noted. Changes consistent with end-stage renal disease. Polycystic changes of the right kidney are noted. Absent left kidney. It is uncertain whether this is postsurgical or related to congenital absence. Bilateral atelectatic changes. Critical Value/emergent results were called by telephone at the time of interpretation on 01/18/2017 at 5:35 pm to Dr. Marda Stalker , who verbally acknowledged these results. Electronically Signed   By: Inez Catalina M.D.   On: 01/18/2017 17:41    ROS: All others negative except those listed in HPI.  Physical Exam: Vitals:   01/19/17 1200 01/19/17 1230 01/19/17 1300  01/19/17 1400  BP: (!) 142/91 137/86 133/89 133/83  Pulse: 93 72 76 76  Resp: (!) 21 19 17 18   Temp:      TempSrc:      SpO2: 100% 97% 98% 100%  Weight:      Height:         General: WDWN, NAD Head: NCAT, sclera not icteric, MMM Neck: Supple. No lymphadenopathy. No JVD. Lungs: CTA bilaterally. No wheeze, rales or rhonchi. Breathing is unlabored. Heart: RRR. No murmur, rubs or gallops.  Abdomen: soft, nontender, +BS, no guarding,  no rebound tenderness Lower extremities:no edema, ischemic changes, or open wounds. +tenderness in b/l lower extremities from knee to toes Neuro: A&Ox3. Moves all extremities spontaneously. Psych:  Responds to questions appropriately with a normal affect. Dialysis Access: R IJ [facility]  Dialysis Orders:  TTS -  [place]  3.75hrs, BFR 400, DFR 800,  EDW 85.5kg, 2K/ 2Ca,   Access: R IJ TDC  NO HEPARIN  Last Labs: 10/11: Hgb 13.0, P 5.7 9/27: TSAT 31%, K 3.9, Ca 8.2,   Albumin 3.9  Assessment/Plan: 1.  Acute on Chronic Aortic Dissection: CT angio identified aortic dissection, h/o surgical repair in 2004, unsure of extent progression. Cardiothoracic surgery recommend medical management - BP goal systolic <409. Per primary. 2.  ESRD -  TTS patient. Does not appear to need Urgent dialysis, under EDW. K 3.6. Will postpone HD until tomorrow 1st shift as patient stable and still undergoing w/u for chest pain.  3.  Hypertension/volume  - BP better controlled. Labetalol BID restarted. Does not appear clinically volume overloaded, under EDW where meeting as outpatient.   4.  Anemia  - Hgb 12.2. Stable. Follow. 5.  Secondary Hyperparathyroidism -  Ca and P close to goal. Continue binder and sensipar.  6.  Nutrition - Alb 3.7. Renal/Carb modified diet.  7. A Fib - Coumadin on hold per primary 8. Thrombocytopenia - Likely related to dissection, trending up. 9. Elevated troponin - mild elevation, trending down- Echo ordered.  10. Neuropathy/Chronic pain - per  primary   Jen Mow, PA-C [facility] Pager: [phone] 01/19/2017, 2:10 PM   Pt seen, examined, agree w assess/plan as above with additions as indicated.  Kelly Splinter MD [facility] pager [phone]    cell [phone] 01/19/2017, 4:17 PM

## 2017-01-19 NOTE — Progress Notes (Signed)
PCCM Attending Re-Rounding Note:  Patient seen during re-rounding today. Case also discussed with nephrology. Plan for hemodialysis tomorrow. Echocardiogram reviewed & there is some new dilation of the patient's right ventricle but systolic function is preserved. Patient has grade 2 diastolic dysfunction which could account for some of this RV dilation. There is no wall motion abnormalities seen in the left ventricle. Patient's blood pressure remains stable. Pain is relatively unchanged. Continuing current plan of care.  I have spent an additional 13 minutes of critical care time today caring for the patient.  Sonia Baller Ashok Cordia, M.D. [facility] Pager:  [phone] After 7pm or if no response, call [phone] 4:23 PM 01/19/17

## 2017-01-19 NOTE — Progress Notes (Signed)
  Echocardiogram 2D Echocardiogram has been performed.  Corey [name] 01/19/2017, 2:59 PM

## 2017-01-19 NOTE — Progress Notes (Signed)
PULMONARY / CRITICAL CARE MEDICINE   Name: Corey [name] MRN: [id number] DOB: Jun 25, 1970    ADMISSION DATE:  01/18/2017   CONSULTATION DATE:  01/18/2017  REFERRING MD:  Marda Stalker, M.D. / EDP  CHIEF COMPLAINT:  Chest Pain & Aortic Dissection  HISTORY OF PRESENT ILLNESS:  46 y.o. male with history of end-stage renal disease on hemodialysis. Patient has a history of aortic dissection with questionable history of repair. Presented with chest pain and elevated troponin I. Pain was "heavy and dull". It was rated 4-6 out of 10 radiating to his back. Patient reported compliance with his antihypertensive regimen. CT angiogram showed acute on chronic dissection. Patient is treated on Coumadin as an outpatient with a history of atrial fibrillation.  SUBJECTIVE: Patient continuing to experience chest discomfort. Currently rates pain as 4 out of 10. Denies any dyspnea. Did received labetalol IV once overnight to maintain goal systolic blood pressure.  REVIEW OF SYSTEMS:  No subjective fever or chills. No headache or vision changes.  VITAL SIGNS: BP 132/76 Comment: Simultaneous filing. User may not have seen previous data.  Pulse 82 Comment: Simultaneous filing. User may not have seen previous data.  Temp 98.8 F (37.1 C) (Oral)   Resp (!) 21 Comment: Simultaneous filing. User may not have seen previous data.  Ht 6\' 2"  (1.88 m)   Wt 180 lb 5.4 oz (81.8 kg)   SpO2 98% Comment: Simultaneous filing. User may not have seen previous data.  BMI 23.15 kg/m   HEMODYNAMICS:    VENTILATOR SETTINGS:    INTAKE / OUTPUT: No intake/output data recorded.  PHYSICAL EXAMINATION: General:  Awake. Alert. No acute distress.   Integument:  Warm & dry. No rash on exposed skin. No bruising on exposed skin. Extremities:  No cyanosis or clubbing.  HEENT:  Moist mucus membranes. No oral ulcers. No scleral injection or icterus.  Cardiovascular:  Regular rate. Unable to appreciate JVD.  Pulmonary:  Good  aeration & clear to auscultation bilaterally. Normal work of breathing. Abdomen: Soft. Normal bowel sounds. Nondistended.  Musculoskeletal:  Normal bulk and tone. No joint effusion appreciated. Neurological:  Cranial nerves 2-12 grossly in tact. No meningismus. Moving all 4 extremities equally.   LABS:  BMET  Recent Labs Lab 01/18/17 1401 01/19/17 0158  NA 136 139  K 4.6 3.6  CL 102 103  CO2 16* 21*  BUN 48* 49*  CREATININE 12.65* 13.83*  GLUCOSE 86 87    Electrolytes  Recent Labs Lab 01/18/17 1401 01/19/17 0158  CALCIUM 8.7* 8.5*  MG  --  2.0  PHOS  --  6.1*    CBC  Recent Labs Lab 01/18/17 1401 01/19/17 0158  WBC 5.1 5.1  HGB 14.3 12.2*  HCT 45.1 38.1*  PLT 102* 121*    Coag's  Recent Labs Lab 01/19/17 0051 01/19/17 0158  APTT 56*  --   INR 2.91 2.90    Sepsis Markers No results for input(s): LATICACIDVEN, PROCALCITON, O2SATVEN in the last 168 hours.  ABG No results for input(s): PHART, PCO2ART, PO2ART in the last 168 hours.  Liver Enzymes  Recent Labs Lab 01/18/17 1401  AST 18  ALT 10*  ALKPHOS 495*  BILITOT 1.3*  ALBUMIN 3.7    Cardiac Enzymes No results for input(s): TROPONINI, PROBNP in the last 168 hours.  Glucose No results for input(s): GLUCAP in the last 168 hours.  Imaging Dg Chest 2 View  Result Date: 01/18/2017 CLINICAL DATA:  Chest pain.  Chronic renal failure EXAM: CHEST  2 VIEW COMPARISON:  None. FINDINGS: Central catheter tip is at cavoatrial junction. No pneumothorax. There is atelectatic change in the right base region. There is slight interstitial edema. No airspace consolidation. Heart is upper normal in size with pulmonary vascularity within normal limits. No adenopathy. There is erosive change in both distal clavicles, likely due to chronic renal failure. There is a stent in the right radial region. IMPRESSION: Suspect a degree of volume overload from chronic renal failure. There is atelectatic change in the  right base. No airspace consolidation. Central catheter tip at cavoatrial junction. No pneumothorax. No evident adenopathy. Electronically Signed   By: Lowella Grip III M.D.   On: 01/18/2017 14:53   Dg Chest Port 1 View  Result Date: 01/19/2017 CLINICAL DATA:  Chest pain. Respiratory failure. Aortic dissection. EXAM: PORTABLE CHEST 1 VIEW COMPARISON:  CT 01/18/2017.  Chest x-ray 01/18/2017. FINDINGS: Right IJ dual-lumen catheter in stable position with tip in right atrium. Aortic size appears stable. Cardiomegaly. Slight increase in pulmonary interstitial prominence noted suggesting CHF. Progressive atelectasis right lung base. Vascular stent right upper extremity. IMPRESSION: 1.  Right IJ dual-lumen catheter stable position. 2. Aortic size appears stable in this patient with known aortic dissection. 3. Cardiomegaly with increased bilateral pulmonary interstitial prominence consistent with CHF. 4.  Progressive right base atelectasis. Electronically Signed   By: Marcello Moores  Register   On: 01/19/2017 07:03   Ct Angio Chest/abd/pel For Dissection W And/or Wo Contrast  Result Date: 01/18/2017 CLINICAL DATA:  Known history of dissection with increased chest pain EXAM: CT ANGIOGRAPHY CHEST, ABDOMEN AND PELVIS TECHNIQUE: Multidetector CT imaging through the chest, abdomen and pelvis was performed using the standard protocol during bolus administration of intravenous contrast. Multiplanar reconstructed images and MIPs were obtained and reviewed to evaluate the vascular anatomy. CONTRAST:  90 mL Isovue 370. COMPARISON:  None. FINDINGS: CTA CHEST FINDINGS Cardiovascular: The ascending aorta is within normal limits without evidence of dissection or aneurysmal dilatation. No valvular replacement is noted. Just beyond the origin of the left subclavian artery there is evidence of a dissection involving the descending thoracic aorta and extending into the proximal abdominal aorta. Significant increased density is noted  within the contrast in the false lumen related to poor flow. Some irregularity is noted distally although this is felt to be related to mixing of opacified and non-opacified blood. The thoracic aorta just beyond the intimal defect measures approximately 4.2 cm in greatest dimension. Descending thoracic aorta measures approximately 4.1 cm. Normal tapering distally is noted. The true lumen is diminutive but patent with supply to the celiac axis, superior mesenteric artery and right renal artery. Postsurgical changes are noted just below the right renal artery consistent with the known history of repair. Mediastinum/Nodes: Thoracic inlet is within normal limits. No significant hilar or mediastinal adenopathy is noted. The esophagus as visualized is within normal limits. Dialysis catheter is noted on the right. Lungs/Pleura: Lungs are well aerated bilaterally. Mild basilar atelectasis is noted in the right middle and right lower lobes as well as in the left lower lobe. Some atelectatic changes are noted in the left lower lobe adjacent to the dilated aorta. Musculoskeletal: Some increased sclerosis is noted within the vertebral bodies consistent with the patient's known history of end-stage renal disease. No acute bony abnormality is noted. Review of the MIP images confirms the above findings. CTA ABDOMEN AND PELVIS FINDINGS VASCULAR Aorta: There changes consistent with a tube graft in the infrarenal abdominal aorta which is widely patent. Small focal  outpouching is noted adjacent to the distal anastomotic site which may be related to prior chronic dissection. As previously described the false lumen an true lumen are opacified within the proximal abdominal aorta although the true lumen is diminutive in size. There is apparent fenestration distally just below the origin of the right renal artery. Celiac: The celiac axis is patent arising from the true lumen as described. No focal stenosis is noted. SMA: Widely patent  arising from the distal aspect of the true lumen. Renals: Single right renal artery is noted arising from the distal aspect of the true lumen just prior to the distal fenestration. Left renal artery is not visualized and may have been related to prior nephrectomy although may be related to congenital absence of the left kidney. IMA: The inferior mesenteric artery is patent arising just below the infrarenal aortic tube graft. Iliacs: Widely patent without aneurysmal dilatation. Mild atherosclerotic changes are noted. Veins: No definitive venous abnormality is noted. There is an IVC filter identified in the infrarenal IVC. Review of the MIP images confirms the above findings. NON-VASCULAR Hepatobiliary: The liver and gallbladder are within normal limits. Pancreas: Unremarkable. No pancreatic ductal dilatation or surrounding inflammatory changes. Spleen: Normal in size without focal abnormality. Adrenals/Urinary Tract: The left kidney is absent. Clinical correlation is recommended as to whether this is congenital or related to prior surgery. Multiple cysts are noted within the residual right kidney consistent with end-stage renal disease. No obstructive changes are noted. The bladder is decompressed. Stomach/Bowel: The appendix is within normal limits. No obstructive or inflammatory changes of the bowel are seen. Lymphatic: No significant lymphadenopathy is noted. Reproductive: Prostate is unremarkable. Other: No abdominal wall hernia or abnormality. No abdominopelvic ascites. Musculoskeletal: Increased sclerosis in the bony structures is noted consistent with patient's known history of end-stage renal disease. Review of the MIP images confirms the above findings. IMPRESSION: Changes consistent with descending thoracic aortic dissection with apparent proximal and distal fenestrations. Slow flow is noted within the false lumen as described. Given the history of prior dissection and repair this is likely chronic in  appearance although given the patient's current clinical symptomatology it would be difficult to exclude an acute component. If prior imaging related to the previous surgery could be obtained at comparison could be made. The true lumen gives supply to the mesenteric vessels as described. No proximal aortic abnormality is noted. Changes consistent with end-stage renal disease. Polycystic changes of the right kidney are noted. Absent left kidney. It is uncertain whether this is postsurgical or related to congenital absence. Bilateral atelectatic changes. Critical Value/emergent results were called by telephone at the time of interpretation on 01/18/2017 at 5:35 pm to Dr. Marda Stalker , who verbally acknowledged these results. Electronically Signed   By: Inez Catalina M.D.   On: 01/18/2017 17:41     STUDIES:  CTA CHEST/ABD/PELVIS 10/17: IMPRESSION: 1. Changes consistent with descending thoracic aortic dissection with apparent proximal and distal fenestrations. Slow flow is noted within the false lumen as described. Given the history of prior dissection and repair this is likely chronic in appearance although given the patient's current clinical symptomatology it would be difficult to exclude an acute component. If prior imaging related to the previous surgery could be obtained at comparison could be made. The true lumen gives supply to the mesenteric vessels as described. No proximal aortic abnormality is noted. 2. Changes consistent with end-stage renal disease. Polycystic changes of the right kidney are noted. 3. Absent left kidney. It is  uncertain whether this is postsurgical or related to congenital absence. 4. Bilateral atelectatic changes. PORT CXR 10/18:  Personally reviewed by me. Mediastinum consistent with aortic dissection. Suggestion of cardiomegaly. Prominent interstitium bilaterally suggestive of pleural effusions. Low lung volumes. Tunneled right hemodialysis catheter  noted.  MICROBIOLOGY: MRSA PCR 10/18:  Negative   ANTIBIOTICS: None.  SIGNIFICANT EVENTS: 10/17 - Admit with acute on chronic aortic dissection  LINES/TUBES: Tunneled R IJ HD Catheter >>> PIV  ASSESSMENT / PLAN:  46 y.o. male with history of aortic dissection with questionable repair. Presenting with chest pain and clinical picture consistent with acute on chronic aortic dissection. Known end-stage renal disease on hemodialysis/renal replacement therapy.  1. Acute on chronic aortic dissection: Appreciate input from cardiothoracic surgery. Goal systolic blood pressure <330. Labetalol IV when necessary ordered. 2. Essential hypertension: Restarting Labetalol 100mg  BID. Monitoring vitals per unit protocol. Labetalol IV when necessary ordered. Goal SBP <140.  3. End-stage renal disease: Nephrology consulted. Renal replacement therapy per their recommendations. Monitoring electrolytes daily. 4. Possible H/O HIT: Antibody panel pending. Argatroban per pharmacy recommendations if INR <2.0.  5. Atrial fibrillation: Monitoring vitals per unit protocol. Continuing to trend systemic anticoagulation/INR daily. Holding Coumadin. 6. Thrombocytopenia: Possibly consumption with acute on chronic aortic dissection. Trending cell counts daily with CBC. 7. Elevated troponin I: Secondary to aortic dissection. Checking echocardiogram. 8. Chronic pain/neuropathy: Holding home Neurontin. Ordering Tylenol & Oxycodone as needed for pain control.   Prophylaxis:  Currently systemically anticoagulated.  Diet: Advancing to Renal Diet with fluid restriction. Code Status: Full code per previous physician's discussion. Disposition: Patient to remain in the ICU for further stabilization. Family Update: Patient updated at the time of my rounds.  I have personally spent an additional total of 34 minutes of critical care time today caring for the patient, updating patient at bedside, & reviewing the patient's electronic  medical record.  Sonia Baller Ashok Cordia, M.D. [facility] Pulmonary & Critical Care Pager:  [phone] After 7pm or if no response, call [phone] 01/19/2017, 8:54 AM

## 2017-01-19 NOTE — H&P (Deleted)
.. ..  Name: Corey [name] MRN: [id number] DOB: 12/16/1970    ADMISSION DATE:  01/18/2017 CONSULTATION DATE:  01/18/17  REFERRING MD :  Sherry Ruffing MD- ER  CHIEF COMPLAINT:  Chest pain radiating to his back  BRIEF PATIENT DESCRIPTION:  46 yr old male with PMHx of ESRD on TTS and h/o aortic dissection, and h/o ascending aneurysm s/p repair that presents with chest pain and elevated troponins. CT shows Acute on Chronic Type 3 dissection .   SIGNIFICANT EVENTS  Troponin  STUDIES:  CTA chest  HISTORY OF PRESENT ILLNESS:   46 yr old male with PMHx of ESRD on TTS, Afib, Calciphylaxis of LUE and h/o dissection that resulted in lower ext paralysis (wheelchair bound) and ascending aortic aneurysm.  Presents with complaints of chest pain which prevented patient from going to scheduled dialysis. He describes a heavy dull pain, 4-6/10 Moderate in nature that radiates to his back. He states he is compliant with his antihypertensive med. He takes Labetalol at home ( prev on Norvasc) BP ranges 130-140 sys at home. He denies any changes in his vision, fever, chills, sob, nausea, or vomiting. CT scan was performed and showed Acute on Chronic dissection. CTSx does not think the patient is acute or active in this pr Of note pt had an unspecified reaction to coumadin in 2004. Has a h/o being on argatroban in Jan 2018 ? HIT. Currently as an outpatient he is on schedule meds incl. 7.5mg  Coumadin on TTSS and 5mg  Coumadin on MWF.  PAST MEDICAL HISTORY :   has a past medical history of A-fib ([place]); Arthritis; Blindness and low vision; Dissection of aorta ([place]) (2004); Dysrhythmia; ESRD (end stage renal disease) ([place]); Headache; History of cardioversion (2014); Hypertension; Neuropathy; Non-healing non-surgical wound (03/2016); Paralysis ([place]); and Pneumonia.  has a past surgical history that includes Repair of acute ascending thoracic aortic dissection; Cardioversion; AV fistula placement; Bascilic vein transposition  (Left, 12/09/2015); Bascilic vein transposition (Left, 03/09/2016); Wound debridement (Left, 04/13/2016); Application if wound vac (Left, 04/13/2016); Application if wound vac (Left, 04/18/2016); Application if wound vac (Left, 04/20/2016); and Revison of arteriovenous fistula (Left, 04/20/2016). Prior to Admission medications   Medication Sig Start Date End Date Taking? Authorizing Provider  cinacalcet (SENSIPAR) 90 MG tablet Take 90 mg by mouth daily with supper.   Yes [provider]  gabapentin (NEURONTIN) 100 MG capsule Take 1 capsule (100 mg total) by mouth 3 (three) times daily. Patient taking differently: Take 100 mg by mouth 3 (three) times daily as needed (pain).  04/27/16  Yes Virgina Jock A, PA-C  labetalol (NORMODYNE) 300 MG tablet Take 300 mg by mouth 2 (two) times daily.   Yes [provider]  RENVELA 800 MG tablet Take 800 mg by mouth 2 (two) times daily. 08/25/16  Yes [provider]  warfarin (COUMADIN) 5 MG tablet TAKE AS DIRECTED BY COUMADIN CLINIC Patient taking differently: TAKE 5MG  BY MOUTH ON MON & FRI AND 7.5MG  ON SUN/TUES/WED/THURS/SAT 11/18/16  Yes Jerline Pain, MD   Allergies  Allergen Reactions  . Heparin Other (See Comments)    UNSPECIFIED REACTION :  On Coumadin since 2004 ? ? HIT ? ?    FAMILY HISTORY:  family history includes Cancer in his father and mother; Hypertension in his father and mother; Stroke in his brother; Thyroid disease in his sister. SOCIAL HISTORY:  reports that he has never smoked. He has never used smokeless tobacco. He reports that he does not drink alcohol or use drugs.  REVIEW OF SYSTEMS:  Pertinent povers highlighted on i Constitutional: Negative for fever, chills, weight loss, malaise/fatigue and diaphoresis.  HENT: Negative for hearing loss, ear pain, nosebleeds, congestion, sore throat, neck pain, tinnitus and ear discharge.   Eyes: Negative for blurred vision, double vision, photophobia, pain, discharge and  redness.  Respiratory: Negative for cough, hemoptysis, sputum production, shortness of breath, wheezing and stridor.   Cardiovascular: Negative for chest pain, palpitations, orthopnea, claudication, leg swelling and PND.  Gastrointestinal: Negative for heartburn, nausea, vomiting, abdominal pain, diarrhea, constipation, blood in stool and melena.  Genitourinary: Negative for dysuria, urgency, frequency, hematuria and flank pain.  Musculoskeletal: Negative for myalgias, back pain, joint pain and falls.  Skin: Negative for itching and rash.  Neurological: Negative for dizziness, tingling, tremors, sensory change, speech change, focal weakness, seizures, loss of consciousness, weakness and headaches.  Endo/Heme/Allergies: Negative for environmental allergies and polydipsia. Does not bruise/bleed easily.  SUBJECTIVE:   VITAL SIGNS: Temp:  [97.8 F (36.6 C)] 97.8 F (36.6 C) (10/17 1401) Pulse Rate:  [67-89] 88 (10/17 2230) Resp:  [12-25] 16 (10/17 2230) BP: (129-168)/(79-106) 141/81 (10/17 2230) SpO2:  [91 %-100 %] 98 % (10/17 2230)  PHYSICAL EXAMINATION: General: well nourished male in no acute distress Neuro: AAOx3 with no focal deficits HEENT:normocephalic atraumatic wearing glasses EOM intact PERRL Cardiovascular: S1 and S2 appreciated  Lungs:  Clear to auscultation bilaterally Abdomen: soft hypoactive BS Musculoskeletal:  No obvious deformity Skin:  Dry no rashes    Recent Labs Lab 01/18/17 1401  NA 136  K 4.6  CL 102  CO2 16*  BUN 48*  CREATININE 12.65*  GLUCOSE 86    Recent Labs Lab 01/18/17 1401  HGB 14.3  HCT 45.1  WBC 5.1  PLT 102*   Dg Chest 2 View  Result Date: 01/18/2017 CLINICAL DATA:  Chest pain.  Chronic renal failure EXAM: CHEST  2 VIEW COMPARISON:  None. FINDINGS: Central catheter tip is at cavoatrial junction. No pneumothorax. There is atelectatic change in the right base region. There is slight interstitial edema. No airspace consolidation. Heart  is upper normal in size with pulmonary vascularity within normal limits. No adenopathy. There is erosive change in both distal clavicles, likely due to chronic renal failure. There is a stent in the right radial region. IMPRESSION: Suspect a degree of volume overload from chronic renal failure. There is atelectatic change in the right base. No airspace consolidation. Central catheter tip at cavoatrial junction. No pneumothorax. No evident adenopathy. Electronically Signed   By: Lowella Grip III M.D.   On: 01/18/2017 14:53   Ct Angio Chest/abd/pel For Dissection W And/or Wo Contrast  Result Date: 01/18/2017 CLINICAL DATA:  Known history of dissection with increased chest pain EXAM: CT ANGIOGRAPHY CHEST, ABDOMEN AND PELVIS TECHNIQUE: Multidetector CT imaging through the chest, abdomen and pelvis was performed using the standard protocol during bolus administration of intravenous contrast. Multiplanar reconstructed images and MIPs were obtained and reviewed to evaluate the vascular anatomy. CONTRAST:  90 mL Isovue 370. COMPARISON:  None. FINDINGS: CTA CHEST FINDINGS Cardiovascular: The ascending aorta is within normal limits without evidence of dissection or aneurysmal dilatation. No valvular replacement is noted. Just beyond the origin of the left subclavian artery there is evidence of a dissection involving the descending thoracic aorta and extending into the proximal abdominal aorta. Significant increased density is noted within the contrast in the false lumen related to poor flow. Some irregularity is noted distally although this is felt to be related to mixing  of opacified and non-opacified blood. The thoracic aorta just beyond the intimal defect measures approximately 4.2 cm in greatest dimension. Descending thoracic aorta measures approximately 4.1 cm. Normal tapering distally is noted. The true lumen is diminutive but patent with supply to the celiac axis, superior mesenteric artery and right renal  artery. Postsurgical changes are noted just below the right renal artery consistent with the known history of repair. Mediastinum/Nodes: Thoracic inlet is within normal limits. No significant hilar or mediastinal adenopathy is noted. The esophagus as visualized is within normal limits. Dialysis catheter is noted on the right. Lungs/Pleura: Lungs are well aerated bilaterally. Mild basilar atelectasis is noted in the right middle and right lower lobes as well as in the left lower lobe. Some atelectatic changes are noted in the left lower lobe adjacent to the dilated aorta. Musculoskeletal: Some increased sclerosis is noted within the vertebral bodies consistent with the patient's known history of end-stage renal disease. No acute bony abnormality is noted. Review of the MIP images confirms the above findings. CTA ABDOMEN AND PELVIS FINDINGS VASCULAR Aorta: There changes consistent with a tube graft in the infrarenal abdominal aorta which is widely patent. Small focal outpouching is noted adjacent to the distal anastomotic site which may be related to prior chronic dissection. As previously described the false lumen an true lumen are opacified within the proximal abdominal aorta although the true lumen is diminutive in size. There is apparent fenestration distally just below the origin of the right renal artery. Celiac: The celiac axis is patent arising from the true lumen as described. No focal stenosis is noted. SMA: Widely patent arising from the distal aspect of the true lumen. Renals: Single right renal artery is noted arising from the distal aspect of the true lumen just prior to the distal fenestration. Left renal artery is not visualized and may have been related to prior nephrectomy although may be related to congenital absence of the left kidney. IMA: The inferior mesenteric artery is patent arising just below the infrarenal aortic tube graft. Iliacs: Widely patent without aneurysmal dilatation. Mild  atherosclerotic changes are noted. Veins: No definitive venous abnormality is noted. There is an IVC filter identified in the infrarenal IVC. Review of the MIP images confirms the above findings. NON-VASCULAR Hepatobiliary: The liver and gallbladder are within normal limits. Pancreas: Unremarkable. No pancreatic ductal dilatation or surrounding inflammatory changes. Spleen: Normal in size without focal abnormality. Adrenals/Urinary Tract: The left kidney is absent. Clinical correlation is recommended as to whether this is congenital or related to prior surgery. Multiple cysts are noted within the residual right kidney consistent with end-stage renal disease. No obstructive changes are noted. The bladder is decompressed. Stomach/Bowel: The appendix is within normal limits. No obstructive or inflammatory changes of the bowel are seen. Lymphatic: No significant lymphadenopathy is noted. Reproductive: Prostate is unremarkable. Other: No abdominal wall hernia or abnormality. No abdominopelvic ascites. Musculoskeletal: Increased sclerosis in the bony structures is noted consistent with patient's known history of end-stage renal disease. Review of the MIP images confirms the above findings. IMPRESSION: Changes consistent with descending thoracic aortic dissection with apparent proximal and distal fenestrations. Slow flow is noted within the false lumen as described. Given the history of prior dissection and repair this is likely chronic in appearance although given the patient's current clinical symptomatology it would be difficult to exclude an acute component. If prior imaging related to the previous surgery could be obtained at comparison could be made. The true lumen gives supply to the  mesenteric vessels as described. No proximal aortic abnormality is noted. Changes consistent with end-stage renal disease. Polycystic changes of the right kidney are noted. Absent left kidney. It is uncertain whether this is  postsurgical or related to congenital absence. Bilateral atelectatic changes. Critical Value/emergent results were called by telephone at the time of interpretation on 01/18/2017 at 5:35 pm to Dr. Marda Stalker , who verbally acknowledged these results. Electronically Signed   By: Inez Catalina M.D.   On: 01/18/2017 17:41    CTA Changes consistent with descending thoracic aortic dissection with apparent proximal and distal fenestrations. Slow flow is noted within the false lumen as described. Given the history of prior dissection and repair this is likely chronic in appearance although given the patient's current clinical symptomatology it would be difficult to exclude an acute component. If prior imaging related to the previous surgery could be obtained at comparison could be made. The true lumen gives supply to the mesenteric vessels as described. No proximal aortic abnormality is noted.  Changes consistent with end-stage renal disease. Polycystic changes of the right kidney are noted.  Absent left kidney. It is uncertain whether this is postsurgical or related to congenital absence. Bilateral atelectatic changes.  ASSESSMENT / PLAN: NEURO: Pain mgmt  GCS 15 Not on sedation or analgesia at this time No seizures and no focal deficits Prn [place] for moderate to severe pain   CARDIAC: Cardiac Panel TIP trending dowHe currently takes labetalol 300mg  Last documented EF d : EKG reviewed  Prn labetalol pushes for BP control goal is to keep systolics< [id number]  PULMONARY: No acute issues at this time Continue O2 supplemental with goal; Sat >92%  ID: No signs of an active infection No empiric antibitoics at this time  Endocrine: ESRD pt on TTS schedule Anemia of Chronic disease  GI: NPO except for meds and ice chips  Heme: Acute on Chronic Type 3 Dissection If Hgb<7 transfuse PRBCs .Marland KitchenA POS H/o HIT positive? Send Pf4 Ab if positive confirm with SRA DVT PPx-> Agatroban per  Pharmacy  RENAL ESRD on HD - TTS normal schedule Pt has very minimal UOP at baseline He remains compliant with HD Will consult Renal MD - for patient to receive scheduled HD without interruption.. Lab Results  Component Value Date   CREATININE 12.65 (H) 01/18/2017   CREATININE 8.31 (H) 08/31/2016   CREATININE 10.77 (H) 04/30/2016  Resume pt's [place]- outpt meds     I, Dr Seward Carol have personally reviewed patient's available data, including medical history, events of note, physical examination and test results as part of my evaluation. I have discussed with PA and other care providers such as pharmacist, RN and RRT. The patient is critically ill with multiple organ systems failure and requires high complexity decision making for assessment and support, frequent evaluation and titration of therapies, application of advanced monitoring technologies and extensive interpretation of multiple databases. Critical Care Time devoted to patient care services described in this note is 40 Minutes. This time reflects time of care of this signee Dr Seward Carol. This critical care time does not reflect procedure time, or teaching time or supervisory time of PA/NP/Med student/Med Resident etc but could involve care discussion time    DISPOSITION: ICU for BP management of Acute on Chronic Type 3 dissection  CC TIME: 40 mins PROGNOSIS: Guarded FAMILY: no family at bedside.  CODE STATUS: Full   Signed Dr Seward Carol Pulmonary [facility] [place]  01/18/2017,  11:44 PM

## 2017-01-19 NOTE — Progress Notes (Signed)
CT Surgery  Chest pain with old type B dissection Now resting comfortably Echo today with normal LV, no AI Seen by renal - HD planned tomorrow

## 2017-01-20 LAB — RENAL FUNCTION PANEL
ALBUMIN: 3 g/dL — AB (ref 3.5–5.0)
Anion gap: 16 — ABNORMAL HIGH (ref 5–15)
BUN: 62 mg/dL — ABNORMAL HIGH (ref 6–20)
CALCIUM: 8.1 mg/dL — AB (ref 8.9–10.3)
CO2: 21 mmol/L — ABNORMAL LOW (ref 22–32)
Chloride: 102 mmol/L (ref 101–111)
Creatinine, Ser: 16.34 mg/dL — ABNORMAL HIGH (ref 0.61–1.24)
GFR calc Af Amer: 4 mL/min — ABNORMAL LOW (ref >60)
GFR calc non Af Amer: 3 mL/min — ABNORMAL LOW (ref >60)
GLUCOSE: 116 mg/dL — AB (ref 65–99)
Phosphorus: 6.7 mg/dL — ABNORMAL HIGH (ref 2.5–4.6)
Potassium: 4.1 mmol/L (ref 3.5–5.1)
Sodium: 139 mmol/L (ref 135–145)

## 2017-01-20 LAB — CBC
HEMATOCRIT: 36 % — AB (ref 39.0–52.0)
HEMOGLOBIN: 11.6 g/dL — AB (ref 13.0–17.0)
MCH: 30.5 pg (ref 26.0–34.0)
MCHC: 32.2 g/dL (ref 30.0–36.0)
MCV: 94.7 fL (ref 78.0–100.0)
Platelets: 109 10*3/uL — ABNORMAL LOW (ref 150–400)
RBC: 3.8 MIL/uL — ABNORMAL LOW (ref 4.22–5.81)
RDW: 16.3 % — ABNORMAL HIGH (ref 11.5–15.5)
WBC: 5.4 10*3/uL (ref 4.0–10.5)

## 2017-01-20 LAB — PROTIME-INR
INR: 2.67
PROTHROMBIN TIME: 28.2 s — AB (ref 11.4–15.2)

## 2017-01-20 MED ORDER — ALTEPLASE 2 MG IJ SOLR: 2.0000 mg | Freq: Once | INTRAMUSCULAR | Status: DC | PRN

## 2017-01-20 MED ORDER — SODIUM CHLORIDE 0.9 % IV SOLN: 100.0000 mL | INTRAVENOUS | Status: DC | PRN

## 2017-01-20 MED ORDER — LABETALOL HCL 200 MG PO TABS
200.0000 mg | ORAL_TABLET | Freq: Two times a day (BID) | ORAL | Status: DC
  Administered 2017-01-20 – 2017-01-22 (×2): 200 mg via ORAL
  Filled 2017-01-20 (×3): qty 1

## 2017-01-20 MED ORDER — PENTAFLUOROPROP-TETRAFLUOROETH EX AERO: 1.0000 "application " | INHALATION_SPRAY | CUTANEOUS | Status: DC | PRN

## 2017-01-20 MED ORDER — LIDOCAINE HCL (PF) 1 % IJ SOLN: 5.0000 mL | INTRAMUSCULAR | Status: DC | PRN

## 2017-01-20 MED ORDER — LIDOCAINE-PRILOCAINE 2.5-2.5 % EX CREA: 1.0000 "application " | TOPICAL_CREAM | CUTANEOUS | Status: DC | PRN

## 2017-01-20 MED ORDER — ANTICOAGULANT SODIUM CITRATE 4% (200MG/5ML) IV SOLN
5.0000 mL | Freq: Once | Status: DC
  Filled 2017-01-20: qty 250

## 2017-01-20 NOTE — Progress Notes (Signed)
PULMONARY / CRITICAL CARE MEDICINE   Name: Corey [name] MRN: [id number] DOB: 1971/03/13    ADMISSION DATE:  01/18/2017   CONSULTATION DATE:  01/18/2017  REFERRING MD:  Marda Stalker, M.D. / EDP  CHIEF COMPLAINT:  Chest Pain & Aortic Dissection  HISTORY OF PRESENT ILLNESS:  46 y.o. male with history of end-stage renal disease on hemodialysis. Patient has a history of aortic dissection with questionable history of repair. Presented with chest pain and elevated troponin I. Pain was "heavy and dull". It was rated 4-6 out of 10 radiating to his back. Patient reported compliance with his antihypertensive regimen. CT angiogram showed acute on chronic dissection. Patient is treated on Coumadin as an outpatient with a history of atrial fibrillation.  SUBJECTIVE:Still has chest pain, HD stable, tx to SDU  REVIEW OF SYSTEMS:  No new complaints   VITAL SIGNS: BP 137/85   Pulse 84   Temp 98.5 F (36.9 C) (Oral)   Resp 17   Ht 6\' 2"  (1.88 m)   Wt 187 lb 13.3 oz (85.2 kg)   SpO2 98%   BMI 24.12 kg/m   HEMODYNAMICS:    VENTILATOR SETTINGS:    INTAKE / OUTPUT: I/O last 3 completed shifts: In: 533 [P.O.:530; I.V.:3] Out: 0   PHYSICAL EXAMINATION: General:  Ill appearing male in nad HEENT: MM pink/moist, no JVD/LAN PSY: Dull affect Neuro: Follows commands, lower ext weak and tender CV: NSR HSR PULM: even/non-labored, lungs bilaterally decreased in bases SW:HQPR, non-tender, bsx4 active  Extremities: warm/dry, + edema lower with touch sensitivity and vascular statis cahnges   Skin:Lower ext cool   LABS:  BMET  Recent Labs Lab 01/18/17 1401 01/19/17 0158  NA 136 139  K 4.6 3.6  CL 102 103  CO2 16* 21*  BUN 48* 49*  CREATININE 12.65* 13.83*  GLUCOSE 86 87    Electrolytes  Recent Labs Lab 01/18/17 1401 01/19/17 0158  CALCIUM 8.7* 8.5*  MG  --  2.0  PHOS  --  6.1*    CBC  Recent Labs Lab 01/18/17 1401 01/19/17 0158  WBC 5.1 5.1  HGB 14.3 12.2*   HCT 45.1 38.1*  PLT 102* 121*    Coag's  Recent Labs Lab 01/19/17 0051 01/19/17 0158  APTT 56*  --   INR 2.91 2.90    Sepsis Markers No results for input(s): LATICACIDVEN, PROCALCITON, O2SATVEN in the last 168 hours.  ABG No results for input(s): PHART, PCO2ART, PO2ART in the last 168 hours.  Liver Enzymes  Recent Labs Lab 01/18/17 1401  AST 18  ALT 10*  ALKPHOS 495*  BILITOT 1.3*  ALBUMIN 3.7    Cardiac Enzymes No results for input(s): TROPONINI, PROBNP in the last 168 hours.  Glucose No results for input(s): GLUCAP in the last 168 hours.  Imaging No results found.   STUDIES:  CTA CHEST/ABD/PELVIS 10/17: IMPRESSION: 1. Changes consistent with descending thoracic aortic dissection with apparent proximal and distal fenestrations. Slow flow is noted within the false lumen as described. Given the history of prior dissection and repair this is likely chronic in appearance although given the patient's current clinical symptomatology it would be difficult to exclude an acute component. If prior imaging related to the previous surgery could be obtained at comparison could be made. The true lumen gives supply to the mesenteric vessels as described. No proximal aortic abnormality is noted. 2. Changes consistent with end-stage renal disease. Polycystic changes of the right kidney are noted. 3. Absent left kidney. It is uncertain  whether this is postsurgical or related to congenital absence. 4. Bilateral atelectatic changes. PORT CXR 10/18:  Personally reviewed by me. Mediastinum consistent with aortic dissection. Suggestion of cardiomegaly. Prominent interstitium bilaterally suggestive of pleural effusions. Low lung volumes. Tunneled right hemodialysis catheter noted.  MICROBIOLOGY: MRSA PCR 10/18:  Negative   ANTIBIOTICS: None.  SIGNIFICANT EVENTS: 10/17 - Admit with acute on chronic aortic dissection  LINES/TUBES: Tunneled R IJ HD Catheter >>> PIV  ASSESSMENT  / PLAN:  46 y.o. male with history of aortic dissection with questionable repair. Presenting with chest pain and clinical picture consistent with acute on chronic aortic dissection. Known end-stage renal disease on hemodialysis/renal replacement therapy. 10/19 HD stable. Able to got to HD unit for hemodialysis. We will transfer to SDU and to the [facility] service and PCCM will sign off.  1. Acute on chronic aortic dissection. BP controlled with half dose labetalol. May need to go to full dose as he improves. CVTS was consulted and no interventions requires. 2. HTN as noted on 1. 3. ESRD for HD per renal 4. ? HIT labs pending, argatroban per pharmacy as needed 5. Afib is being monitored 6. Thrombocytopenia 102->121, CONTINUE TO MONITOR 7. Elevated tropon, Echo with G2DD 8. Chronic pain/neuropathy contine current tx 9. Disposition. Tc to SDU and to [facility] service 10/20  Prophylaxis:  Currently systemically anticoagulated.  Diet: Advancing to Renal Diet with fluid restriction. Code Status: Full code per previous physician's discussion. Disposition: Patient to remain in the ICU for further stabilization. Family Update: Patient updated at the time of my rounds.  Richardson Landry Minor ACNP Maryanna Shape PCCM Pager [phone] till 3 pm If no answer page [phone] 01/20/2017, 9:48 AM

## 2017-01-20 NOTE — Progress Notes (Signed)
ANTICOAGULATION CONSULT NOTE - Initial Consult  Pharmacy Consult for argatroban (warfarin on hold) Indication: atrial fibrillation (history of HIT)  Allergies  Allergen Reactions  . Heparin Other (See Comments)    UNSPECIFIED REACTION :  On Coumadin since 2004 ? ? HIT ? ?   Vital Signs: Temp: 98.2 F (36.8 C) (10/19 1005) Temp Source: Oral (10/19 1005) BP: 130/78 (10/19 1330) Pulse Rate: 73 (10/19 1330)  Labs:  Recent Labs  01/18/17 1401 01/19/17 0051 01/19/17 0158 01/20/17 0931 01/20/17 1000 01/20/17 1015  HGB 14.3  --  12.2*  --   --  11.6*  HCT 45.1  --  38.1*  --   --  36.0*  PLT 102*  --  121*  --   --  109*  APTT  --  56*  --   --   --   --   LABPROT  --  30.1* 30.1* 28.2*  --   --   INR  --  2.91 2.90 2.67  --   --   CREATININE 12.65*  --  13.83*  --  16.34*  --     Estimated Creatinine Clearance: 6.6 mL/min (A) (by C-G formula based on SCr of 16.34 mg/dL (H)).   Medical History: Past Medical History:  Diagnosis Date  . A-fib ([place])   . Arthritis    hands and shoulders  . Blindness and low vision    "Stargardt disease"  . Dissection of aorta ([place]) 2004  . Dysrhythmia    A-fib  . ESRD (end stage renal disease) ([place])   . Headache   . History of cardioversion 2014  . Hypertension   . Neuropathy   . Non-healing non-surgical wound 03/2016  . Paralysis ([place])    due to dissection of aorta in 2004, lower extremities  . Pneumonia     Assessment: 46 yo male with history of ESRD on HD admitted with chest pain. On warfarin PTA for history of afib with last dose on 10/16. Pharmacy consulted to dose argatroban (when INR <2) while warfarin on hold d/t history of HIT. New HIT panel sent per CCM.   Patient evaluated by CVTS d/t history of aortic dissection. Per Dr. Servando Snare note, non-operative at this time.   INR decreased from 2.9 to 2.67 today. CBC remains stable. No signs/symptoms of bleeding noted in chart. Will wait to initiate argatroban when INR <2.    PTA warfarin dose: 5mg  MonFri and 7.5 mg all other days.   Goal of Therapy:  aPTT range of 50-90 seconds Monitor platelets by anticoagulation protocol: Yes   Plan:  Start argatroban when INR <2 Daily INR F/u HIT panel   Doylene Canard, PharmD Clinical Pharmacist  Phone: [phone] 01/20/17 3:11 PM

## 2017-01-20 NOTE — Progress Notes (Signed)
PCCM Re-Rounding Note:  Patient seen again this afternoon. Post dialysis & removal of reported 2 L of fluid patient's blood pressure still borderline. Increasing Labetalol to 200mg  BID. Still reporting chest discomfort of 2-4 out of 10. Otherwise stable. Tolerating diet. Changed to SDU status. Updated brother at bedside regarding plan of care.  Sonia Baller Ashok Cordia, M.D. [facility]monary [facility] Critical [facility] Pager:  [phone] After 7pm or if no response, call [phone] 5:04 PM 01/20/17

## 2017-01-20 NOTE — Care Management Note (Signed)
Case Management Note  Patient Details  Name: Duard Spiewak MRN: [id number] Date of Birth: 1971/01/28  Subjective/Objective:  From home with friend, pta indep, he hs HD on Tue, THurs ,Sat.  He states he has applied for Medicaid, and [employer] covers his medications.  He has no PCP,  NCM scheduled follow up apt with [facility] for Nov 12 at 1:30 pm.  NCM gave patient the brochure for follow up appt information.                   Action/Plan: NCM will follow for dc needs.   Expected Discharge Date:                  Expected Discharge Plan:  Home/Self Care  In-House Referral:     Discharge planning Services  CM Consult  Post Acute Care Choice:    Choice offered to:     DME Arranged:    DME Agency:     HH Arranged:    [place] Agency:     Status of Service:  Completed, signed off  If discussed at [employer] of Stay Meetings, dates discussed:    Additional Comments:  Zenon Mayo, RN 01/20/2017, 4:07 PM

## 2017-01-20 NOTE — Progress Notes (Signed)
[facility] Progress Note  Subjective: no new c/o's, CP better, ECHO showed some RV dilatation, otherwise no new findings. Trop peaked 0.12 , down to 0.09 last measurement  Vitals:   01/20/17 0630 01/20/17 0700 01/20/17 0800 01/20/17 0900  BP: 139/78 (!) 147/80 (!) 146/79 137/85  Pulse: 70 71 69 84  Resp: 19 (!) 21 18 17   Temp:  98.5 F (36.9 C)    TempSrc:  Oral    SpO2: 95% 95% 94% (!) 87%  Weight:      Height:        Inpatient medications: . labetalol  100 mg Oral BID  . mouth rinse  15 mL Mouth Rinse BID  . sevelamer carbonate  800 mg Oral TID WC  . sodium chloride flush  3 mL Intravenous Q12H   . sodium chloride    . sodium chloride     sodium chloride, sodium chloride, acetaminophen, labetalol, oxyCODONE, sodium chloride flush  Exam: General: WDWN, NAD Head: NCAT, sclera not icteric, MMM Neck: Supple. No lymphadenopathy. No JVD. Lungs: CTA bilaterally. No wheeze, rales or rhonchi. Breathing is unlabored. Heart: RRR. No murmur, rubs or gallops.  Abdomen: soft, nontender, +BS, no guarding, no rebound tenderness Ext: no edema, tender extremities from knee to toes Neuro: A&Ox3. Moves all extremities spontaneously. Psych:  Responds to questions appropriately with a normal affect. Dialysis Access: R IJ [facility]  Dialysis Orders:  TTS -  [place] 3h 90min   85.5kg   2/2   R IJ TDC  Hep none  Last Labs: 10/11: Hgb 13.0, P 5.7 9/27: TSAT 31%, K 3.9, Ca 8.2,   Albumin 3.9  Assessment: 1.  Acute/ Chronic Aortic Dissection: CT angio identified aortic dissection, h/o surgical repair in 2004. Cardiothoracic surgery recommend medical management - BP goal systolic <287. Per primary. 2.  ESRD -  TTS patient. Plan HD today off schedule.  3.  Hypertension/volume  - BP better controlled. Labetalol BID restarted. Is under dry wt. plan UF 2-3kg as tolerated today.  4.  Anemia  - Hgb 12.2. Stable. Follow. 5.  Secondary Hyperparathyroidism -  Ca and P close to  goal. Continue binder and sensipar.  6.  Nutrition - Alb 3.7. Renal/Carb modified diet.  7. A Fib - Coumadin on hold per primary 8. Thrombocytopenia - Likely related to dissection, trending up. 9. Elevated troponin - mild elevation, trending down- Echo ordered.  10. Neuropathy/Chronic pain - per primary   Plan - HD today upstairs, pt is going to be SDU/ tele status per CCM. HD tomorrow    Kelly Splinter MD [facility] pager [phone]   01/20/2017, 9:31 AM    Recent Labs Lab 01/18/17 1401 01/19/17 0158  NA 136 139  K 4.6 3.6  CL 102 103  CO2 16* 21*  GLUCOSE 86 87  BUN 48* 49*  CREATININE 12.65* 13.83*  CALCIUM 8.7* 8.5*  PHOS  --  6.1*    Recent Labs Lab 01/18/17 1401  AST 18  ALT 10*  ALKPHOS 495*  BILITOT 1.3*  PROT 7.2  ALBUMIN 3.7    Recent Labs Lab 01/18/17 1401 01/19/17 0158  WBC 5.1 5.1  HGB 14.3 12.2*  HCT 45.1 38.1*  MCV 96.0 94.8  PLT 102* 121*   Iron/TIBC/Ferritin/ %Sat No results found for: IRON, TIBC, FERRITIN, IRONPCTSAT

## 2017-01-21 ENCOUNTER — Inpatient Hospital Stay (HOSPITAL_COMMUNITY): Payer: Medicare Other

## 2017-01-21 DIAGNOSIS — I10 Essential (primary) hypertension: Secondary | ICD-10-CM

## 2017-01-21 DIAGNOSIS — Z992 Dependence on renal dialysis: Secondary | ICD-10-CM

## 2017-01-21 DIAGNOSIS — I482 Chronic atrial fibrillation: Secondary | ICD-10-CM

## 2017-01-21 DIAGNOSIS — I7101 Dissection of thoracic aorta: Secondary | ICD-10-CM

## 2017-01-21 DIAGNOSIS — G6289 Other specified polyneuropathies: Secondary | ICD-10-CM

## 2017-01-21 DIAGNOSIS — N186 End stage renal disease: Secondary | ICD-10-CM

## 2017-01-21 DIAGNOSIS — G629 Polyneuropathy, unspecified: Secondary | ICD-10-CM

## 2017-01-21 DIAGNOSIS — D696 Thrombocytopenia, unspecified: Secondary | ICD-10-CM

## 2017-01-21 DIAGNOSIS — I5032 Chronic diastolic (congestive) heart failure: Secondary | ICD-10-CM

## 2017-01-21 DIAGNOSIS — R748 Abnormal levels of other serum enzymes: Secondary | ICD-10-CM

## 2017-01-21 LAB — CBC WITH DIFFERENTIAL/PLATELET
BASOS ABS: 0 10*3/uL (ref 0.0–0.1)
BASOS PCT: 0 %
Eosinophils Absolute: 0.1 10*3/uL (ref 0.0–0.7)
Eosinophils Relative: 3 %
HEMATOCRIT: 38.4 % — AB (ref 39.0–52.0)
HEMOGLOBIN: 12.2 g/dL — AB (ref 13.0–17.0)
Lymphocytes Relative: 26 %
Lymphs Abs: 1.4 10*3/uL (ref 0.7–4.0)
MCH: 30.3 pg (ref 26.0–34.0)
MCHC: 31.8 g/dL (ref 30.0–36.0)
MCV: 95.5 fL (ref 78.0–100.0)
MONOS PCT: 9 %
Monocytes Absolute: 0.5 10*3/uL (ref 0.1–1.0)
Neutro Abs: 3.3 10*3/uL (ref 1.7–7.7)
Neutrophils Relative %: 62 %
Platelets: 118 10*3/uL — ABNORMAL LOW (ref 150–400)
RBC: 4.02 MIL/uL — AB (ref 4.22–5.81)
RDW: 16 % — ABNORMAL HIGH (ref 11.5–15.5)
WBC: 5.3 10*3/uL (ref 4.0–10.5)

## 2017-01-21 LAB — RENAL FUNCTION PANEL
ALBUMIN: 2.9 g/dL — AB (ref 3.5–5.0)
ANION GAP: 12 (ref 5–15)
BUN: 25 mg/dL — ABNORMAL HIGH (ref 6–20)
CALCIUM: 8.8 mg/dL — AB (ref 8.9–10.3)
CO2: 24 mmol/L (ref 22–32)
Chloride: 98 mmol/L — ABNORMAL LOW (ref 101–111)
Creatinine, Ser: 8.84 mg/dL — ABNORMAL HIGH (ref 0.61–1.24)
GFR calc Af Amer: 7 mL/min — ABNORMAL LOW (ref >60)
GFR, EST NON AFRICAN AMERICAN: 6 mL/min — AB (ref >60)
GLUCOSE: 85 mg/dL (ref 65–99)
PHOSPHORUS: 6.1 mg/dL — AB (ref 2.5–4.6)
POTASSIUM: 3.7 mmol/L (ref 3.5–5.1)
SODIUM: 134 mmol/L — AB (ref 135–145)

## 2017-01-21 LAB — APTT
APTT: 45 s — AB (ref 24–36)
APTT: 69 s — AB (ref 24–36)
APTT: 66 s — AB (ref 24–36)

## 2017-01-21 LAB — MAGNESIUM: Magnesium: 1.9 mg/dL (ref 1.7–2.4)

## 2017-01-21 LAB — HEPARIN INDUCED PLATELET AB (HIT ANTIBODY): Heparin Induced Plt Ab: 0.355 OD (ref 0.000–0.400)

## 2017-01-21 LAB — PROTIME-INR
INR: 1.88
Prothrombin Time: 21.4 seconds — ABNORMAL HIGH (ref 11.4–15.2)

## 2017-01-21 MED ORDER — GABAPENTIN 100 MG PO CAPS: 100.0000 mg | ORAL_CAPSULE | Freq: Three times a day (TID) | ORAL | Status: DC

## 2017-01-21 MED ORDER — ANTICOAGULANT SODIUM CITRATE 4% (200MG/5ML) IV SOLN
5.0000 mL | Freq: Once | Status: AC
  Filled 2017-01-21: qty 250

## 2017-01-21 MED ORDER — SEVELAMER CARBONATE 0.8 G PO PACK
1.6000 g | PACK | Freq: Three times a day (TID) | ORAL | Status: DC
  Administered 2017-01-22 – 2017-01-26 (×9): 1.6 g via ORAL
  Filled 2017-01-21 (×19): qty 2

## 2017-01-21 MED ORDER — GABAPENTIN 100 MG PO CAPS
200.0000 mg | ORAL_CAPSULE | Freq: Three times a day (TID) | ORAL | Status: DC
  Administered 2017-01-21 – 2017-01-25 (×13): 200 mg via ORAL
  Filled 2017-01-21 (×13): qty 2

## 2017-01-21 MED ORDER — ANTICOAGULANT SODIUM CITRATE 4% (200MG/5ML) IV SOLN
5.0000 mL | Freq: Once | Status: AC
  Administered 2017-01-21: 5 mL via INTRAVENOUS
  Filled 2017-01-21: qty 5

## 2017-01-21 MED ORDER — ARGATROBAN 50 MG/50ML IV SOLN
0.2500 ug/kg/min | INTRAVENOUS | Status: DC
  Administered 2017-01-21: 0.25 ug/kg/min via INTRAVENOUS
  Filled 2017-01-21 (×2): qty 50

## 2017-01-21 MED ORDER — SEVELAMER CARBONATE 800 MG PO TABS: 1600.0000 mg | ORAL_TABLET | Freq: Three times a day (TID) | ORAL | Status: DC

## 2017-01-21 MED ORDER — ANTICOAGULANT SODIUM CITRATE 4% (200MG/5ML) IV SOLN: 5.0000 mL | Freq: Once | Status: AC

## 2017-01-21 NOTE — Progress Notes (Signed)
Patient returned to 2H-04 from dialysis via bed. A/O X 4.No acute distress.

## 2017-01-21 NOTE — Progress Notes (Signed)
PROGRESS NOTE    Corey [name]  SFK:[id number] DOB: 08-Feb-1971 DOA: 01/18/2017 PCP: Patient, No Pcp Per   Brief Narrative:   46 yr old BM  PMHx of ESRD on HD  T/Th/Sat, A-fib S/P Cardioversion 2014, Calciphylaxis of LUE, Aortic Dissection 2004--> lower ext paralysis (wheelchair bound), Ascending Aortic Aneurysm HTN Peripheral Neuropathy,,.    Presents with complaints of chest pain which prevented patient from going to scheduled dialysis. He describes a heavy dull pain, 4-6/10 Moderate in nature that radiates to his back. He states he is compliant with his antihypertensive med. He takes Labetalol at home ( prev on Norvasc) BP ranges 130-140 sys at home. He denies any changes in his vision, fever, chills, sob, nausea, or vomiting. CT scan was performed and showed Acute on Chronic dissection. CTSx does not think the patient is acute or active in this pr Of note pt had an unspecified reaction to coumadin in 2004. Has a h/o being on argatroban in Jan 2018 ? HIT. Currently as an outpatient he is on schedule meds incl. 7.5mg  Coumadin on TTSS and 5mg  Coumadin on MWF.    Subjective: 10/21 A/O 4, positive SOB, positive CP rated 5/10 with radiation to his back increased from baseline, positive, negative N/V   .   Assessment & Plan:   Active Problems:   Chest pain  Acute on Chronic Aortic Dissection -medical BP control SBP goal< 140. -Per cardiothoracic surgery nonoperative. Medical management maximize BP control -10/21 decrease Labetalol 150 mg BID. Patient's BP to soft, asymptomatic with feeling of going to pass out. Certainly would not tolerate HD  -10/21 repeat stat CT angiogram chest secondary to patient having positive chest pain with radiation to back. Stable aortic dissection. See results below  Essential HTN -see aortic dissection  Chronic diastolic CHF -strict in and out -521ml -Daily weight Filed Weights   01/21/17 0913 01/21/17 1217 01/22/17 0420  Weight: 188 lb 11.4 oz (85.6  kg) 188 lb 11.4 oz (85.6 kg) 186 lb 15.2 oz (84.8 kg)  -See aortic dissection  Elevated Troponin -likely secondary to aortic dissection -EchocardiogramI: Likely secondary to aortic dissection. No wall motion abnormality on echocardiogram.  Chronic atrial fibrillation -currently in NSR -Rate controlled -Continue argatroban  HIT? -10/18 HIT antibody pending   ESRD on HD  T/Th/Sat, -HD per nephrology  Thrombocytopenia -Stable continue to monitor closely -Most likely secondary to consumption with acute on chronic aortic dissection. -Negative sign of occult bleed  Peripheral Neuropathy 10/21 increase Neurontin 300 mg TID      DVT prophylaxis: Argatroban Code Status: full Family Communication: None Disposition Plan: TBD   Consultants:  Cardiothoracic surgery Nephrology PC CM    Procedures/Significant Events:   10/17 - Admit with acute on chronic aortic dissection 10/17 CTA CHEST/ABD/PELVIS:- descending thoracic aortic dissection with apparent proximal and distal fenestrations. Slow flow is noted within the false lumen as described. Given the history of prior dissection and repair this is likely chronic in appearance. -True lumen gives supply to the mesenteric vessels as described. No proximal aortic abnormality is noted. -Changes consistent ESRDwith end-stage renal disease. -Polycystic changes of the right kidney are noted. -Absent left kidney.  10/18 PCXR: Previously reviewed by me. Mediastinum consistent with aortic dissection. Suggestion of cardiomegaly. Prominent interstitium bilaterally suggestive of pleural effusions. Low lung volumes. Tunneled right hemodialysis catheter noted. 10/18 Echocardiogram; LVEF=  EF 65-70% without regional wall motion abnormality. -Grade  2 diastolic dysfunction.- RA moderately dilated.- RV moderately dilated with preserved systolic function.  10/21 CT  angiogram chest/abdomen/pelvis for dissection:-Stable type B thoracoabdominal aortic  dissection compare to 01/18/2017. 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  -Absent left kidney      I have personally reviewed and interpreted all radiology studies and my findings are as above.  VENTILATOR SETTINGS: none   Cultures MRSA PCR 10/18: Negative   Antimicrobials: none   Devices none   LINES / TUBES:  RIGHT Vas-Cath??>>    Continuous Infusions: . sodium chloride    . sodium chloride    . anticoagulant sodium citrate Stopped (01/20/17 1547)  . argatroban       Objective: Vitals:   01/21/17 0500 01/21/17 0530 01/21/17 0600 01/21/17 0731  BP: 115/72 124/72 118/66 106/64  Pulse: 76 74 73 73  Resp: 15 13 (!) 21   Temp:    98.4 F (36.9 C)  TempSrc:    Oral  SpO2: 96% 93% 96% 97%  Weight: 180 lb 12.4 oz (82 kg)     Height:        Intake/Output Summary (Last 24 hours) at 01/21/17 0902 Last data filed at 01/21/17 0600  Gross per 24 hour  Intake              240 ml  Output             2000 ml  Net            -1760 ml   Filed Weights   01/20/17 1005 01/20/17 1400 01/21/17 0500  Weight: 185 lb 3 oz (84 kg) 179 lb 10.8 oz (81.5 kg) 180 lb 12.4 oz (82 kg)    Physical Exam:  General: A/O 4,No acute respiratory distress Neck:  Negative scars, masses, torticollis, lymphadenopathy, JVD Lungs: Clear to auscultation bilaterally without wheezes or crackles Cardiovascular: tachycardic,Regular rhythm without murmur gallop or rub normal S1 and S2 Abdomen: negative abdominal pain, nondistended, positive soft, bowel sounds, no rebound, no ascites, no appreciable mass Extremities: No significant cyanosis, clubbing, or edema bilateral lower extremity hypertrophy, extreme painful to palpation (peripheral neuropathy). Negative pedal edema Psychiatric:  Negative depression, positive anxiety, negative fatigue, negative mania  Central nervous system:  Cranial nerves II through XII intact, tongue/uvula midline,  all extremities muscle strength 5/5, sensation intact throughout, , negative dysarthria, negative expressive aphasia, negative receptive aphasia.      Data Reviewed: Care during the described time interval was provided by me .  I have reviewed this patient's available data, including medical history, events of note, physical examination, and all test results as part of my evaluation.   CBC:  Recent Labs Lab 01/18/17 1401 01/19/17 0158 01/20/17 1015 01/21/17 0549  WBC 5.1 5.1 5.4 5.3  NEUTROABS  --   --   --  3.3  HGB 14.3 12.2* 11.6* 12.2*  HCT 45.1 38.1* 36.0* 38.4*  MCV 96.0 94.8 94.7 95.5  PLT 102* 121* 109* 161*   Basic Metabolic Panel:  Recent Labs Lab 01/18/17 1401 01/19/17 0158 01/20/17 1000 01/21/17 0549  NA 136 139 139 134*  K 4.6 3.6 4.1 3.7  CL 102 103 102 98*  CO2 16* 21* 21* 24  GLUCOSE 86 87 116* 85  BUN 48* 49* 62* 25*  CREATININE 12.65* 13.83* 16.34* 8.84*  CALCIUM 8.7* 8.5* 8.1* 8.8*  MG  --  2.0  --  1.9  PHOS  --  6.1* 6.7* 6.1*   GFR: Estimated Creatinine Clearance: 12.1 mL/min (A) (by C-G formula based on SCr of 8.84  mg/dL (H)). Liver Function Tests:  Recent Labs Lab 01/18/17 1401 01/20/17 1000 01/21/17 0549  AST 18  --   --   ALT 10*  --   --   ALKPHOS 495*  --   --   BILITOT 1.3*  --   --   PROT 7.2  --   --   ALBUMIN 3.7 3.0* 2.9*    Recent Labs Lab 01/18/17 1401  LIPASE 47   No results for input(s): AMMONIA in the last 168 hours. Coagulation Profile:  Recent Labs Lab 01/19/17 0051 01/19/17 0158 01/20/17 0931 01/21/17 0549  INR 2.91 2.90 2.67 1.88   Cardiac Enzymes: No results for input(s): CKTOTAL, CKMB, CKMBINDEX, TROPONINI in the last 168 hours. BNP (last 3 results) No results for input(s): PROBNP in the last 8760 hours. HbA1C: No results for input(s): HGBA1C in the last 72 hours. CBG: No results for input(s): GLUCAP in the last 168 hours. Lipid Profile: No results for input(s): CHOL, HDL, LDLCALC, TRIG,  CHOLHDL, LDLDIRECT in the last 72 hours. Thyroid Function Tests: No results for input(s): TSH, T4TOTAL, FREET4, T3FREE, THYROIDAB in the last 72 hours. Anemia Panel: No results for input(s): VITAMINB12, FOLATE, FERRITIN, TIBC, IRON, RETICCTPCT in the last 72 hours. Urine analysis: No results found for: COLORURINE, APPEARANCEUR, LABSPEC, PHURINE, GLUCOSEU, HGBUR, BILIRUBINUR, KETONESUR, PROTEINUR, UROBILINOGEN, NITRITE, LEUKOCYTESUR Sepsis Labs: @LABRCNTIP (procalcitonin:4,lacticidven:4)  ) Recent Results (from the past 240 hour(s))  MRSA PCR Screening     Status: None   Collection Time: 01/19/17  1:45 AM  Result Value Ref Range Status   MRSA by PCR NEGATIVE NEGATIVE Final    Comment:        The GeneXpert MRSA Assay (FDA approved for NASAL specimens only), is one component of a comprehensive MRSA colonization surveillance program. It is not intended to diagnose MRSA infection nor to guide or monitor treatment for MRSA infections.          Radiology Studies: Dg Chest Port 1 View  Result Date: 01/21/2017 CLINICAL DATA:  Respiratory failure EXAM: PORTABLE CHEST 1 VIEW COMPARISON:  01/19/17 FINDINGS: Cardiac shadow is stable. Prominent aorta is again noted consistent with the patient's given clinical history of descending thoracic aortic dissection. Dialysis catheter is again noted and stable. Stenting in the right upper arm is seen. Some improved atelectasis in the right base is noted. The left lung remains clear. No acute bony abnormality is noted. IMPRESSION: Improving atelectasis in the right base. No new focal abnormality is noted. Electronically Signed   By: Inez Catalina M.D.   On: 01/21/2017 06:49        Scheduled Meds: . labetalol  200 mg Oral BID  . mouth rinse  15 mL Mouth Rinse BID  . sevelamer carbonate  800 mg Oral TID WC  . sodium chloride flush  3 mL Intravenous Q12H   Continuous Infusions: . sodium chloride    . sodium chloride    . anticoagulant sodium  citrate Stopped (01/20/17 1547)  . argatroban       LOS: 3 days    Time spent: 40 minutes    Corey [name], Corey Docker, MD [facility] Pager [phone]   If 7PM-7AM, please contact night-coverage [email or web address] Password [email or web address] 01/21/2017, 9:02 AM

## 2017-01-21 NOTE — Progress Notes (Addendum)
ANTICOAGULATION CONSULT NOTE - [place] for argatroban (warfarin on hold) Indication: atrial fibrillation (history of HIT)  Allergies  Allergen Reactions  . Heparin Other (See Comments)    UNSPECIFIED REACTION :  On Coumadin since 2004 ? ? HIT ? ?   Vital Signs: Temp: 98.1 F (36.7 C) (10/20 1307) Temp Source: Oral (10/20 1307) BP: 108/72 (10/20 1307) Pulse Rate: 75 (10/20 1307)  Labs:  Recent Labs  01/19/17 0051 01/19/17 0158 01/20/17 0931 01/20/17 1000 01/20/17 1015 01/21/17 0549 01/21/17 1228  HGB  --  12.2*  --   --  11.6* 12.2*  --   HCT  --  38.1*  --   --  36.0* 38.4*  --   PLT  --  121*  --   --  109* 118*  --   APTT 56*  --   --   --   --  45* 69*  LABPROT 30.1* 30.1* 28.2*  --   --  21.4*  --   INR 2.91 2.90 2.67  --   --  1.88  --   CREATININE  --  13.83*  --  16.34*  --  8.84*  --     Estimated Creatinine Clearance: 12.1 mL/min (A) (by C-G formula based on SCr of 8.84 mg/dL (H)).   Medical History: Past Medical History:  Diagnosis Date  . A-fib ([place])   . Arthritis    hands and shoulders  . Blindness and low vision    "Stargardt disease"  . Dissection of aorta ([place]) 2004  . Dysrhythmia    A-fib  . ESRD (end stage renal disease) ([place])   . Headache   . History of cardioversion 2014  . Hypertension   . Neuropathy   . Non-healing non-surgical wound 03/2016  . Paralysis ([place])    due to dissection of aorta in 2004, lower extremities  . Pneumonia     Assessment: 46 yo male with history of ESRD on HD admitted with chest pain. On warfarin PTA for history of afib with last dose on 10/16. Pharmacy consulted to dose argatroban (when INR <2) while warfarin on hold d/t history of HIT. HIT panel sent per CCM- still in process.   Patient evaluated by CVTS d/t history of aortic dissection. Per Dr. Servando Snare note, non-operative at this time.   Baseline aPTT was 45 this morning prior to argatroban start. Infusion was started at  1016 this morning. Initial aPTT 2 hours after infusion start came back within goal range at 69. CBC remains stable. No signs/symptoms of bleeding noted.  PTA warfarin dose: 5mg  MonFri and 7.5 mg all other days.   Goal of Therapy:  aPTT range of 50-90 seconds Monitor platelets by anticoagulation protocol: Yes   Plan:  Continue argatroban at 0.25 mcg/kg/min using weight of 82 kg Obtain confirmatory aPTT in 2 hours Daily INR, aPTT, CBC, and s/sx of bleeding  F/u HIT panel   Doylene Canard, PharmD Clinical Pharmacist  Phone: [phone] 01/21/17 1:38 PM    1530 pm: confirmatory PTT therapeutic Thank you Anette Guarneri, PharmD [phone]

## 2017-01-21 NOTE — Progress Notes (Signed)
Tx time adjusted to 3 hours per nephrology.    Will continue to monitor.

## 2017-01-21 NOTE — Progress Notes (Signed)
Dialysis treatment completed.  500 mL ultrafiltrated.  0 mL net fluid removal.  Patient status unchanged. Lung sounds diminished to ausculation in all fields. No edema. Cardiac: NSR.  Cleansed RIJ catheter with chlorhexidine.  Disconnected lines and flushed ports with saline per protocol.  Ports locked with sodium citrate and capped per protocol.    Report given to bedside, RN Marcene Brawn.

## 2017-01-21 NOTE — Progress Notes (Signed)
Patient transported to dialysis via bed with transport. A/O X 4. No acute distress.

## 2017-01-21 NOTE — Progress Notes (Signed)
ANTICOAGULATION CONSULT NOTE - Initial Consult  Pharmacy Consult for argatroban (warfarin on hold) Indication: atrial fibrillation (history of HIT)  Allergies  Allergen Reactions  . Heparin Other (See Comments)    UNSPECIFIED REACTION :  On Coumadin since 2004 ? ? HIT ? ?   Vital Signs: Temp: 98.4 F (36.9 C) (10/20 0731) Temp Source: Oral (10/20 0731) BP: 106/64 (10/20 0731) Pulse Rate: 73 (10/20 0731)  Labs:  Recent Labs  01/19/17 0051 01/19/17 0158 01/20/17 0931 01/20/17 1000 01/20/17 1015 01/21/17 0549  HGB  --  12.2*  --   --  11.6* 12.2*  HCT  --  38.1*  --   --  36.0* 38.4*  PLT  --  121*  --   --  109* 118*  APTT 56*  --   --   --   --  45*  LABPROT 30.1* 30.1* 28.2*  --   --  21.4*  INR 2.91 2.90 2.67  --   --  1.88  CREATININE  --  13.83*  --  16.34*  --  8.84*    Estimated Creatinine Clearance: 12.1 mL/min (A) (by C-G formula based on SCr of 8.84 mg/dL (H)).   Medical History: Past Medical History:  Diagnosis Date  . A-fib ([place])   . Arthritis    hands and shoulders  . Blindness and low vision    "Stargardt disease"  . Dissection of aorta ([place]) 2004  . Dysrhythmia    A-fib  . ESRD (end stage renal disease) ([place])   . Headache   . History of cardioversion 2014  . Hypertension   . Neuropathy   . Non-healing non-surgical wound 03/2016  . Paralysis ([place])    due to dissection of aorta in 2004, lower extremities  . Pneumonia     Assessment: 46 yo male with history of ESRD on HD admitted with chest pain. On warfarin PTA for history of afib with last dose on 10/16. Pharmacy consulted to dose argatroban (when INR <2) while warfarin on hold d/t history of HIT. HIT panel sent per CCM- still in process.   Patient evaluated by CVTS d/t history of aortic dissection. Per Dr. Servando Snare note, non-operative at this time.   INR decreased from 2.67 to 1.88 today. CBC remains stable. No signs/symptoms of bleeding noted. Historically, patient was therapeutic on  argatroban 0.25 mcg/kg/min in January 2018.   PTA warfarin dose: 5mg  MonFri and 7.5 mg all other days.   Goal of Therapy:  aPTT range of 50-90 seconds Monitor platelets by anticoagulation protocol: Yes   Plan:  Start argatroban 0.25 mcg/kg/min using weight of 82 kg Obtain aPTT 2 hours after start of infusion  Daily INR, aPTT, CBC, and s/sx of bleeding  F/u HIT panel   Doylene Canard, PharmD Clinical Pharmacist  Phone: [phone] 01/21/17 8:09 AM

## 2017-01-21 NOTE — Progress Notes (Signed)
Patient arrived to unit by bed.  Reviewed treatment plan and this RN agrees with plan.  Report received from bedside RN, Marcene Brawn.  Consent verified.  Patient A & O X 4.   Lung sounds diminished to ausculation in all fields. No edema. Cardiac:  NSR.  Removed caps and cleansed RIJ catheter with chlorhedxidine.  Aspirated ports of heparin and flushed them with saline per protocol.  Connected and secured lines, initiated treatment at 0917.  UF Goal of 500 mL and net fluid removal 0 L.  Will continue to monitor.

## 2017-01-21 NOTE — Progress Notes (Signed)
Subjective:  On HD , comfortable with no UF with initial BP 90's.Noted Labetalol Incr 200 mg bid Dr. Ashok Cordia .Denies cp now   Objective Vital signs in last 24 hours: Vitals:   01/21/17 0930 01/21/17 1000 01/21/17 1030 01/21/17 1100  BP: (!) 92/53 114/62 112/75 (!) 100/58  Pulse: 76 76 79 65  Resp:      Temp:      TempSrc:      SpO2:      Weight:      Height:       Weight change: -1.2 kg (-2 lb 10.3 oz)  Physical Exam: General: Alert chronically ill AAM Heart: RRR, No mur, rub, gallop Lungs: CTA  Abdomen: BS pos soft <Nt, ND Extremities: no pedal edema, tender to touch bilat legs (chronic Neuropathy Issue) Dialysis Access: R IJ p.cath    Dialysis Orders: TTS - [place] 3h 70min   85.5kg   2/2   R IJ TDC  Hep none Last Labs: 10/11:Hgb 13.0, P 5.7 9/27: TSAT 31%, K 3.9, Ca 8.2, Albumin 3.9  Problem/Plan: 1. Acute/ Chronic Aortic Dissection: CT angio identified aortic dissection, h/o surgical repair in 2004. Cardiothoracic surgery recommend medical management - BP goal systolic <720. Per primary. 2. ESRD- TTS patient. HD today on schedule.  3. Hypertension/volume- BP better controlled. Labetalol BID restarted 200 mg / bp lowish today pre hd and with BED WT Is under dry wt./plan to keep even on HD today /CXR = Improving Atelectasis plan 4. Anemia-  No ESA with Hgb 12.2. Stable Follow. 5. Secondary Hyperparathyroidism - Ca corec 9.7  and P  6.1 Renveal 800 mg ^ 1600 mg/  And on sensipar.  6. Nutrition- Alb 2.9 . Renal/Carb modified diet.  7. A Fib- Coumadin on hold per primary 8. Thrombocytopenia- Likely related to dissection, trending up. 9. Elevated troponin -mild elevation, trending down- Echo ordered. 10. Neuropathy/Chronic pain- per primary    Ernest Haber, PA-C [place] [phone] 01/21/2017,11:32 AM  LOS: 3 days   Pt seen, examined and agree w A/P as above.  Kelly Splinter MD [facility] pager  [phone]   01/21/2017, 3:38 PM    Labs: Basic Metabolic Panel:  Recent Labs Lab 01/19/17 0158 01/20/17 1000 01/21/17 0549  NA 139 139 134*  K 3.6 4.1 3.7  CL 103 102 98*  CO2 21* 21* 24  GLUCOSE 87 116* 85  BUN 49* 62* 25*  CREATININE 13.83* 16.34* 8.84*  CALCIUM 8.5* 8.1* 8.8*  PHOS 6.1* 6.7* 6.1*   Liver Function Tests:  Recent Labs Lab 01/18/17 1401 01/20/17 1000 01/21/17 0549  AST 18  --   --   ALT 10*  --   --   ALKPHOS 495*  --   --   BILITOT 1.3*  --   --   PROT 7.2  --   --   ALBUMIN 3.7 3.0* 2.9*    Recent Labs Lab 01/18/17 1401  LIPASE 47   No results for input(s): AMMONIA in the last 168 hours. CBC:  Recent Labs Lab 01/18/17 1401 01/19/17 0158 01/20/17 1015 01/21/17 0549  WBC 5.1 5.1 5.4 5.3  NEUTROABS  --   --   --  3.3  HGB 14.3 12.2* 11.6* 12.2*  HCT 45.1 38.1* 36.0* 38.4*  MCV 96.0 94.8 94.7 95.5  PLT 102* 121* 109* 118*   Cardiac Enzymes: No results for input(s): CKTOTAL, CKMB, CKMBINDEX, TROPONINI in the last 168 hours. CBG: No results for input(s): GLUCAP in  the last 168 hours.  Studies/Results: Dg Chest Port 1 View  Result Date: 01/21/2017 CLINICAL DATA:  Respiratory failure EXAM: PORTABLE CHEST 1 VIEW COMPARISON:  01/19/17 FINDINGS: Cardiac shadow is stable. Prominent aorta is again noted consistent with the patient's given clinical history of descending thoracic aortic dissection. Dialysis catheter is again noted and stable. Stenting in the right upper arm is seen. Some improved atelectasis in the right base is noted. The left lung remains clear. No acute bony abnormality is noted. IMPRESSION: Improving atelectasis in the right base. No new focal abnormality is noted. Electronically Signed   By: Inez Catalina M.D.   On: 01/21/2017 06:49   Medications: . sodium chloride    . sodium chloride    . anticoagulant sodium citrate Stopped (01/20/17 1547)  . anticoagulant sodium citrate    . anticoagulant sodium citrate    .  argatroban 0.25 mcg/kg/min (01/21/17 1016)   . gabapentin  200 mg Oral TID  . labetalol  200 mg Oral BID  . mouth rinse  15 mL Mouth Rinse BID  . sevelamer carbonate  800 mg Oral TID WC  . sodium chloride flush  3 mL Intravenous Q12H

## 2017-01-22 ENCOUNTER — Other Ambulatory Visit: Payer: Self-pay

## 2017-01-22 ENCOUNTER — Inpatient Hospital Stay (HOSPITAL_COMMUNITY): Payer: Medicare Other

## 2017-01-22 ENCOUNTER — Encounter (HOSPITAL_COMMUNITY): Payer: Self-pay | Admitting: *Deleted

## 2017-01-22 DIAGNOSIS — I7103 Dissection of thoracoabdominal aorta: Principal | ICD-10-CM

## 2017-01-22 DIAGNOSIS — G6281 Critical illness polyneuropathy: Secondary | ICD-10-CM

## 2017-01-22 LAB — CBC WITH DIFFERENTIAL/PLATELET
Basophils Absolute: 0 10*3/uL (ref 0.0–0.1)
Basophils Relative: 0 %
Eosinophils Absolute: 0.2 10*3/uL (ref 0.0–0.7)
Eosinophils Relative: 4 %
HCT: 39.1 % (ref 39.0–52.0)
Hemoglobin: 12.6 g/dL — ABNORMAL LOW (ref 13.0–17.0)
Lymphocytes Relative: 25 %
Lymphs Abs: 1.2 10*3/uL (ref 0.7–4.0)
MCH: 31 pg (ref 26.0–34.0)
MCHC: 32.2 g/dL (ref 30.0–36.0)
MCV: 96.3 fL (ref 78.0–100.0)
Monocytes Absolute: 0.5 10*3/uL (ref 0.1–1.0)
Monocytes Relative: 10 %
Neutro Abs: 3 10*3/uL (ref 1.7–7.7)
Neutrophils Relative %: 62 %
Platelets: 109 10*3/uL — ABNORMAL LOW (ref 150–400)
RBC: 4.06 MIL/uL — ABNORMAL LOW (ref 4.22–5.81)
RDW: 16 % — ABNORMAL HIGH (ref 11.5–15.5)
WBC: 4.9 10*3/uL (ref 4.0–10.5)

## 2017-01-22 LAB — RENAL FUNCTION PANEL
Albumin: 3 g/dL — ABNORMAL LOW (ref 3.5–5.0)
Anion gap: 13 (ref 5–15)
BUN: 19 mg/dL (ref 6–20)
CO2: 23 mmol/L (ref 22–32)
Calcium: 9 mg/dL (ref 8.9–10.3)
Chloride: 100 mmol/L — ABNORMAL LOW (ref 101–111)
Creatinine, Ser: 6.31 mg/dL — ABNORMAL HIGH (ref 0.61–1.24)
GFR calc Af Amer: 11 mL/min — ABNORMAL LOW
GFR calc non Af Amer: 10 mL/min — ABNORMAL LOW
Glucose, Bld: 110 mg/dL — ABNORMAL HIGH (ref 65–99)
Phosphorus: 5.5 mg/dL — ABNORMAL HIGH (ref 2.5–4.6)
Potassium: 3.8 mmol/L (ref 3.5–5.1)
Sodium: 136 mmol/L (ref 135–145)

## 2017-01-22 LAB — TYPE AND SCREEN
ABO/RH(D): A POS
Antibody Screen: NEGATIVE

## 2017-01-22 LAB — BASIC METABOLIC PANEL
Anion gap: 9 (ref 5–15)
BUN: 21 mg/dL — AB (ref 6–20)
CALCIUM: 8.8 mg/dL — AB (ref 8.9–10.3)
CO2: 25 mmol/L (ref 22–32)
CREATININE: 7.32 mg/dL — AB (ref 0.61–1.24)
Chloride: 100 mmol/L — ABNORMAL LOW (ref 101–111)
GFR calc non Af Amer: 8 mL/min — ABNORMAL LOW (ref >60)
GFR, EST AFRICAN AMERICAN: 9 mL/min — AB (ref >60)
Glucose, Bld: 102 mg/dL — ABNORMAL HIGH (ref 65–99)
Potassium: 3.9 mmol/L (ref 3.5–5.1)
SODIUM: 134 mmol/L — AB (ref 135–145)

## 2017-01-22 LAB — CBC
HEMATOCRIT: 39.4 % (ref 39.0–52.0)
Hemoglobin: 12.4 g/dL — ABNORMAL LOW (ref 13.0–17.0)
MCH: 30.5 pg (ref 26.0–34.0)
MCHC: 31.5 g/dL (ref 30.0–36.0)
MCV: 97 fL (ref 78.0–100.0)
Platelets: 129 10*3/uL — ABNORMAL LOW (ref 150–400)
RBC: 4.06 MIL/uL — ABNORMAL LOW (ref 4.22–5.81)
RDW: 16.3 % — ABNORMAL HIGH (ref 11.5–15.5)
WBC: 5.3 10*3/uL (ref 4.0–10.5)

## 2017-01-22 LAB — MAGNESIUM
MAGNESIUM: 1.9 mg/dL (ref 1.7–2.4)
MAGNESIUM: 1.9 mg/dL (ref 1.7–2.4)

## 2017-01-22 LAB — PROTIME-INR
INR: 1.96
INR: 1.86
PROTHROMBIN TIME: 21.3 s — AB (ref 11.4–15.2)
Prothrombin Time: 22.1 s — ABNORMAL HIGH (ref 11.4–15.2)

## 2017-01-22 LAB — APTT: aPTT: 63 seconds — ABNORMAL HIGH (ref 24–36)

## 2017-01-22 MED ORDER — ARGATROBAN 50 MG/50ML IV SOLN
0.2500 ug/kg/min | INTRAVENOUS | Status: DC
  Administered 2017-01-22 – 2017-01-25 (×3): 0.25 ug/kg/min via INTRAVENOUS
  Filled 2017-01-22 (×3): qty 50

## 2017-01-22 MED ORDER — LABETALOL HCL 300 MG PO TABS
150.0000 mg | ORAL_TABLET | Freq: Two times a day (BID) | ORAL | Status: DC
  Filled 2017-01-22: qty 1

## 2017-01-22 MED ORDER — IOPAMIDOL (ISOVUE-370) INJECTION 76%
INTRAVENOUS | Status: AC
  Administered 2017-01-22: 100 mL
  Filled 2017-01-22: qty 100

## 2017-01-22 NOTE — Progress Notes (Signed)
Late Entry  At 1250 - BP 86/60, HR 75, pt c/o left sided chest pain that radiates to his back and states he "just feels off." Dr. Sherral Hammers notified. Patient to be urgently transported to CT. Dr. Sherral Hammers at bedside almost immediately. BP's cycling frequently and documented in epic. Patient transported to CT with RN. Rapid response notified just in case, but was not needed. MD notified of results.   Joellen Jersey, RN

## 2017-01-22 NOTE — Progress Notes (Signed)
PROGRESS NOTE    Corey [name]  WUX:[id number] DOB: [birth year] DOA: 01/18/2017 PCP: Patient, No Pcp Per   Brief Narrative:  46 yr old BM  PMHx of ESRD on HD  T/Th/Sat, A-fib S/P Cardioversion 2014, Calciphylaxis of LUE, Aortic Dissection 2004--> lower ext paralysis (wheelchair bound), Ascending Aortic Aneurysm HTN Peripheral Neuropathy,,.     Presents with complaints of chest pain which prevented patient from going to scheduled dialysis. He describes a heavy dull pain, 4-6/10 Moderate in nature that radiates to his back. He states he is compliant with his antihypertensive med. He takes Labetalol at home ( prev on Norvasc) BP ranges 130-140 sys at home. He denies any changes in his vision, fever, chills, sob, nausea, or vomiting. CT scan was performed and showed Acute on Chronic dissection. CTSx does not think the patient is acute or active in this pr Of note pt had an unspecified reaction to coumadin in 2004. Has a h/o being on argatroban in Jan 2018 ? HIT. Currently as an outpatient he is on schedule meds incl. 7.5mg  Coumadin on TTSS and 5mg  Coumadin on MWF.    Subjective: 10/20 A/O 4, positive SOB, positive CP rated 4/10 negative radiation, positive bilateral lower extremity pain (described as needles depends),negative N/V      Assessment & Plan:   Active Problems:   Chest pain   Acute on Chronic Aortic Dissection -medical BP control SBP goal< 140. -Per cardiothoracic surgery nonoperative. Medical management maximize BP control -labetalol 200 mg BID    Essential HTN -see aortic dissection   Chronic diastolic CHF -strict in and out  -Daily weight      Filed Weights    01/21/17 0913 01/21/17 1217 01/22/17 0420  Weight: 188 lb 11.4 oz (85.6 kg) 188 lb 11.4 oz (85.6 kg) 186 lb 15.2 oz (84.8 kg)  -See aortic dissection   Elevated Troponin -likely secondary to aortic dissection -Echocardiogram: Likely secondary to aortic dissection. No wall motion abnormality on  echocardiogram.  Chronic atrial fibrillation -currently in NSR -Rate controlled -Continue argatroban   HIT? -10/18 HIT antibody pending    ESRD on HD  T/Th/Sat, -HD per nephrology   Thrombocytopenia -Stable continue to monitor closely -Most likely secondary to consumption with acute on chronic aortic dissection. -Negative sign of occult bleed   Peripheral Neuropathy 10/20 increase Neurontin 200 mg TID      DVT prophylaxis: Argatroban  Code Status: full Family Communication: none Disposition Plan: TBD   Consultants:  Nephrology Cardiothoracic surgery PC CM   Procedures/Significant Events:  10/17 - Admit with acute on chronic aortic dissection 10/17 CTA CHEST/ABD/PELVIS:- descending thoracic aortic dissection with apparent proximal and distal fenestrations. Slow flow is noted within the false lumen as described. Given the history of prior dissection and repair this is likely chronic in appearance. -True lumen gives supply to the mesenteric vessels as described. No proximal aortic abnormality is noted. -Changes consistent ESRDwith end-stage renal disease. -Polycystic changes of the right kidney are noted. -Absent left kidney.  10/18 PCXR: Previously reviewed by me. Mediastinum consistent with aortic dissection. Suggestion of cardiomegaly. Prominent interstitium bilaterally suggestive of pleural effusions. Low lung volumes. Tunneled right hemodialysis catheter noted. 10/18 Echocardiogram; LVEF=  EF 65-70% without regional wall motion abnormality. -Grade  2 diastolic dysfunction.- RA moderately dilated.- RV moderately dilated with preserved systolic function.     I have personally reviewed and interpreted all radiology studies and my findings are as above.  VENTILATOR SETTINGS: none   Cultures none  Antimicrobials: none  Devices    LINES / TUBES:      Continuous Infusions: . sodium chloride    . sodium chloride 250 mL (01/22/17 0624)  . anticoagulant  sodium citrate Stopped (01/20/17 1547)  . argatroban 0.25 mcg/kg/min (01/22/17 1246)     Objective: Vitals:   01/22/17 1430 01/22/17 1500 01/22/17 1530 01/22/17 1725  BP: 104/63 99/67 106/68 (!) 89/52  Pulse: 71 77 66   Resp: 14 (!) 23 17 15   Temp:    98.2 F (36.8 C)  TempSrc:    Oral  SpO2: 99% 96% 99% 96%  Weight:      Height:        Intake/Output Summary (Last 24 hours) at 01/22/17 1956 Last data filed at 01/22/17 1250  Gross per 24 hour  Intake           620.06 ml  Output                0 ml  Net           620.06 ml   Filed Weights   01/21/17 0913 01/21/17 1217 01/22/17 0420  Weight: 188 lb 11.4 oz (85.6 kg) 188 lb 11.4 oz (85.6 kg) 186 lb 15.2 oz (84.8 kg)    Examination:  General: A/O 4,No acute respiratory distress Neck:  Negative scars, masses, torticollis, lymphadenopathy, JVD Lungs: Clear to auscultation bilaterally without wheezes or crackles Cardiovascular: tachycardic,Regular rhythm without murmur gallop or rub normal S1 and S2 Abdomen: negative abdominal pain, nondistended, positive soft, bowel sounds, no rebound, no ascites, no appreciable mass Extremities: No significant cyanosis, clubbing, or edema bilateral lower extremity hypertrophy, extreme painful to palpation (peripheral neuropathy). Negative pedal edema Psychiatric:  Negative depression, negative anxiety, negative fatigue, negative mania  Central nervous system:  Cranial nerves II through XII intact, tongue/uvula midline, all extremities muscle strength 5/5, sensation intact throughout, , negative dysarthria, negative expressive aphasia, negative receptive aphasia .     Data Reviewed: Care during the described time interval was provided by me .  I have reviewed this patient's available data, including medical history, events of note, physical examination, and all test results as part of my evaluation.   CBC:  Recent Labs Lab 01/19/17 0158 01/20/17 1015 01/21/17 0549 01/22/17 0257  01/22/17 1324  WBC 5.1 5.4 5.3 4.9 5.3  NEUTROABS  --   --  3.3 3.0  --   HGB 12.2* 11.6* 12.2* 12.6* 12.4*  HCT 38.1* 36.0* 38.4* 39.1 39.4  MCV 94.8 94.7 95.5 96.3 97.0  PLT 121* 109* 118* 109* 096*   Basic Metabolic Panel:  Recent Labs Lab 01/19/17 0158 01/20/17 1000 01/21/17 0549 01/22/17 0257 01/22/17 1324  NA 139 139 134* 136 134*  K 3.6 4.1 3.7 3.8 3.9  CL 103 102 98* 100* 100*  CO2 21* 21* 24 23 25   GLUCOSE 87 116* 85 110* 102*  BUN 49* 62* 25* 19 21*  CREATININE 13.83* 16.34* 8.84* 6.31* 7.32*  CALCIUM 8.5* 8.1* 8.8* 9.0 8.8*  MG 2.0  --  1.9 1.9 1.9  PHOS 6.1* 6.7* 6.1* 5.5*  --    GFR: Estimated Creatinine Clearance: 14.7 mL/min (A) (by C-G formula based on SCr of 7.32 mg/dL (H)). Liver Function Tests:  Recent Labs Lab 01/18/17 1401 01/20/17 1000 01/21/17 0549 01/22/17 0257  AST 18  --   --   --   ALT 10*  --   --   --   ALKPHOS 495*  --   --   --  BILITOT 1.3*  --   --   --   PROT 7.2  --   --   --   ALBUMIN 3.7 3.0* 2.9* 3.0*    Recent Labs Lab 01/18/17 1401  LIPASE 47   No results for input(s): AMMONIA in the last 168 hours. Coagulation Profile:  Recent Labs Lab 01/19/17 0158 01/20/17 0931 01/21/17 0549 01/22/17 0257 01/22/17 1324  INR 2.90 2.67 1.88 1.96 1.86   Cardiac Enzymes: No results for input(s): CKTOTAL, CKMB, CKMBINDEX, TROPONINI in the last 168 hours. BNP (last 3 results) No results for input(s): PROBNP in the last 8760 hours. HbA1C: No results for input(s): HGBA1C in the last 72 hours. CBG: No results for input(s): GLUCAP in the last 168 hours. Lipid Profile: No results for input(s): CHOL, HDL, LDLCALC, TRIG, CHOLHDL, LDLDIRECT in the last 72 hours. Thyroid Function Tests: No results for input(s): TSH, T4TOTAL, FREET4, T3FREE, THYROIDAB in the last 72 hours. Anemia Panel: No results for input(s): VITAMINB12, FOLATE, FERRITIN, TIBC, IRON, RETICCTPCT in the last 72 hours. Urine analysis: No results found for:  COLORURINE, APPEARANCEUR, LABSPEC, PHURINE, GLUCOSEU, HGBUR, BILIRUBINUR, KETONESUR, PROTEINUR, UROBILINOGEN, NITRITE, LEUKOCYTESUR Sepsis Labs: @LABRCNTIP (procalcitonin:4,lacticidven:4)  ) Recent Results (from the past 240 hour(s))  MRSA PCR Screening     Status: None   Collection Time: 01/19/17  1:45 AM  Result Value Ref Range Status   MRSA by PCR NEGATIVE NEGATIVE Final    Comment:        The GeneXpert MRSA Assay (FDA approved for NASAL specimens only), is one component of a comprehensive MRSA colonization surveillance program. It is not intended to diagnose MRSA infection nor to guide or monitor treatment for MRSA infections.          Radiology Studies: Dg Chest Port 1 View  Result Date: 01/21/2017 CLINICAL DATA:  Respiratory failure EXAM: PORTABLE CHEST 1 VIEW COMPARISON:  01/19/17 FINDINGS: Cardiac shadow is stable. Prominent aorta is again noted consistent with the patient's given clinical history of descending thoracic aortic dissection. Dialysis catheter is again noted and stable. Stenting in the right upper arm is seen. Some improved atelectasis in the right base is noted. The left lung remains clear. No acute bony abnormality is noted. IMPRESSION: Improving atelectasis in the right base. No new focal abnormality is noted. Electronically Signed   By: Inez Catalina M.D.   On: 01/21/2017 06:49   Ct Angio Chest/abd/pel For Dissection W And/or W/wo  Result Date: 01/22/2017 CLINICAL DATA:  Thoracoabdominal aortic dissection, persistent chest pain EXAM: CT ANGIOGRAPHY CHEST, ABDOMEN AND PELVIS TECHNIQUE: Multidetector CT imaging through the chest, abdomen and pelvis was performed using the standard protocol during bolus administration of intravenous contrast. Multiplanar reconstructed images and MIPs were obtained and reviewed to evaluate the vascular anatomy. CONTRAST:  100 cc Isovue 370 COMPARISON:  01/18/2017 FINDINGS: CTA CHEST FINDINGS Cardiovascular: There is re-  demonstration of a thoracoabdominal aortic dissection extending just beyond the left subclavian artery involving the descending thoracic aorta and extending into the abdominal aorta just inferior to the right renal artery. Findings compatible with a type B dissection. Grossly stable appearance compared 01/18/2017. Measuring at similar levels, the descending thoracic aorta has a maximal diameter of 4.1 cm. The true lumen remains the smaller of the 2. Mesenteric and right renal vasculature originating off of the smaller true lumen. Ascending thoracic aorta and major branch vessels remain uninvolved. Major branch vessels remain patent. No mediastinal hemorrhage or hematoma. Central pulmonary arteries are patent. Normal heart size. Native coronary atherosclerosis  noted. No pericardial effusion. Right IJ dialysis catheter tips within the proximal right atrium. Mediastinum/Nodes: No enlarged mediastinal, hilar, or axillary lymph nodes. Thyroid gland, trachea, and esophagus demonstrate no significant findings. Lungs/Pleura: Similar pattern of scattered bibasilar, lingula, right middle lobe atelectasis. No new acute airspace process, sickle collapse, consolidation. No significant edema. No pleural abnormality, pleural effusion, or pneumothorax. Musculoskeletal: No acute osseous finding. Diffuse increased osseous sclerosis compatible with renal osteodystrophy. No compression fracture. Intact sternum. Review of the MIP images confirms the above findings. CTA ABDOMEN AND PELVIS FINDINGS VASCULAR Aorta: Type B dissection extends to just inferior to the right renal artery. Patient appears status post straight tube graft repair of the infrarenal aorta. Stable atherosclerotic change and wall irregularity of the abdominal aortic repair. Stable small focal outpouching of the distal anastomosis. No retroperitoneal hemorrhage or hematoma. Celiac, SMA, and right renal artery originate off the smaller true lumen. Celiac: Remains patent  SMA: Remains patent Renals: Right renal artery remains patent. Patient is either status post left nephrectomy or has a congenitally absent left kidney. IMA: IMA remains patent inferiorly. Inflow: Iliac vasculature is patent but tortuous with mild atherosclerotic change. No occlusive process or inflow disease. Visualize common femoral, proximal profunda femoral, proximal superficial femoral arteries are patent. Veins: Venous imaging not performed. IVC within the infrarenal IVC as before. Review of the MIP images confirms the above findings. NON-VASCULAR Hepatobiliary: No focal liver abnormality is seen. No gallstones, gallbladder wall thickening, or biliary dilatation. Pancreas: Unremarkable. No pancreatic ductal dilatation or surrounding inflammatory changes. Spleen: Normal in size without focal abnormality. Adrenals/Urinary Tract: Adrenal glands appear normal. Left kidney is absent, either related to congenital absence versus prior nephrectomy. Right kidney demonstrates innumerable renal cysts compatible with a chronic cystic disease of dialysis. No hydronephrosis or obstruction. Bladder is collapsed. Stomach/Bowel: Negative bowel obstruction, significant dilatation, ileus, or free air. Normal appendix. No fluid collection or abscess. Colonic diverticulosis, most pronounced in the sigmoid. Lymphatic: No significant adenopathy. Reproductive: No significant or acute finding by CT. Prostate normal size. Seminal vesicles symmetric. Other: No abdominal wall hernia or abnormality. No abdominopelvic ascites. Musculoskeletal: Sclerosis of the osseous structures compatible with renal osteodystrophy. Degenerative changes of the spine. No acute osseous finding. Review of the MIP images confirms the above findings. IMPRESSION: Stable type B thoracoabdominal aortic dissection compare to 01/18/2017. 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 Absent left  kidney either congenital versus previous nephrectomy. Bibasilar atelectasis No other acute intra-abdominal or pelvic finding. Electronically Signed   By: Jerilynn Mages.  Shick M.D.   On: 01/22/2017 15:00        Scheduled Meds: . gabapentin  200 mg Oral TID  . labetalol  150 mg Oral BID  . mouth rinse  15 mL Mouth Rinse BID  . sevelamer carbonate  1.6 g Oral TID WC  . sodium chloride flush  3 mL Intravenous Q12H   Continuous Infusions: . sodium chloride    . sodium chloride 250 mL (01/22/17 0624)  . anticoagulant sodium citrate Stopped (01/20/17 1547)  . argatroban 0.25 mcg/kg/min (01/22/17 1246)     LOS: 4 days    Time spent: 40 minutes    Haillee Johann, Geraldo Docker, MD [facility] Pager [phone]   If 7PM-7AM, please contact night-coverage [email or web address] Password [facility] 01/22/2017, 7:56 PM

## 2017-01-22 NOTE — Progress Notes (Signed)
Subjective:  Still having chest pain, ant chest w/o radiation.  No SOB, HD yest w/o incident.   Objective Vital signs in last 24 hours: Vitals:   01/21/17 2200 01/22/17 0015 01/22/17 0420 01/22/17 0854  BP: 95/67 108/78 98/67 130/81  Pulse: 78 80 85 92  Resp:  (!) 22 16 (!) 22  Temp:  98.4 F (36.9 C) 98.2 F (36.8 C)   TempSrc:  Oral Oral   SpO2:  90% 96% 96%  Weight:   84.8 kg (186 lb 15.2 oz)   Height:       Weight change: 1.6 kg (3 lb 8.4 oz)  Physical Exam: General: Alert chronically ill AAM Heart: RRR, No mur, rub, gallop Lungs: CTA  Abdomen: BS pos soft <Nt, ND Extremities: no pedal edema, tender to touch bilat legs (chronic Neuropathy Issue) Dialysis Access: R IJ p.cath    Dialysis Orders: TTS - [place] 3h 68min   85.5kg   2/2   R IJ TDC  Hep none Last Labs: 10/11:Hgb 13.0, P 5.7 9/27: TSAT 31%, K 3.9, Ca 8.2, Albumin 3.9  Assessment: 1. Acute/ Chronic Aortic Dissection: CT angio identified aortic dissection, prob chronic but "difficult to exclude acute component w/ pt's symptoms". h/o surgical repair for same in 2004. Cardiothoracic surgery recommend medical management w/ BP goal systolic <341. Still having sig chest pain; has questions regarding what's causing his chest pain, defer to cardiology/ TCTS.   2. ESRD- TTS HD 3. Hypertension/volume- BP well controlled, was on labetalol 300 bid at home, now on 200 bid here and it was held twice yesterday due to soft BP's.  At dry wt, euvolemic on exam.  4. Anemia-  No ESA with Hgb 12.2. Stable Follow. 5. Secondary Hyperparathyroidism - Ca corec 9.7  and P  6.1 Renveal 800 mg ^ 1600 mg +sensipar.  6. Nutrition- Alb 2.9 . Renal/Carb modified diet.  7. A Fib- Coumadin on hold per primary, has heparin allergy, is getting argatroban IV 8. Thrombocytopenia- Likely related to dissection, trending up. 9. Elevated troponin -mild elevation, trending down- Echo ordered. 10. Neuropathy/Chronic pain- per  primary     Plan - cont TTS HD   Kelly Splinter MD [place] pager [phone]   01/22/2017, 11:51 AM    Labs: Basic Metabolic Panel:  Recent Labs Lab 01/20/17 1000 01/21/17 0549 01/22/17 0257  NA 139 134* 136  K 4.1 3.7 3.8  CL 102 98* 100*  CO2 21* 24 23  GLUCOSE 116* 85 110*  BUN 62* 25* 19  CREATININE 16.34* 8.84* 6.31*  CALCIUM 8.1* 8.8* 9.0  PHOS 6.7* 6.1* 5.5*   Liver Function Tests:  Recent Labs Lab 01/18/17 1401 01/20/17 1000 01/21/17 0549 01/22/17 0257  AST 18  --   --   --   ALT 10*  --   --   --   ALKPHOS 495*  --   --   --   BILITOT 1.3*  --   --   --   PROT 7.2  --   --   --   ALBUMIN 3.7 3.0* 2.9* 3.0*    Recent Labs Lab 01/18/17 1401  LIPASE 47   No results for input(s): AMMONIA in the last 168 hours. CBC:  Recent Labs Lab 01/18/17 1401 01/19/17 0158 01/20/17 1015 01/21/17 0549 01/22/17 0257  WBC 5.1 5.1 5.4 5.3 4.9  NEUTROABS  --   --   --  3.3 3.0  HGB 14.3 12.2* 11.6* 12.2* 12.6*  HCT 45.1  38.1* 36.0* 38.4* 39.1  MCV 96.0 94.8 94.7 95.5 96.3  PLT 102* 121* 109* 118* 109*   Cardiac Enzymes: No results for input(s): CKTOTAL, CKMB, CKMBINDEX, TROPONINI in the last 168 hours. CBG: No results for input(s): GLUCAP in the last 168 hours.  Studies/Results: Dg Chest Port 1 View  Result Date: 01/21/2017 CLINICAL DATA:  Respiratory failure EXAM: PORTABLE CHEST 1 VIEW COMPARISON:  01/19/17 FINDINGS: Cardiac shadow is stable. Prominent aorta is again noted consistent with the patient's given clinical history of descending thoracic aortic dissection. Dialysis catheter is again noted and stable. Stenting in the right upper arm is seen. Some improved atelectasis in the right base is noted. The left lung remains clear. No acute bony abnormality is noted. IMPRESSION: Improving atelectasis in the right base. No new focal abnormality is noted. Electronically Signed   By: Inez Catalina M.D.   On: 01/21/2017 06:49    Medications: . sodium chloride    . sodium chloride 250 mL (01/22/17 0624)  . anticoagulant sodium citrate Stopped (01/20/17 1547)  . argatroban     . gabapentin  200 mg Oral TID  . labetalol  200 mg Oral BID  . mouth rinse  15 mL Mouth Rinse BID  . sevelamer carbonate  1.6 g Oral TID WC  . sodium chloride flush  3 mL Intravenous Q12H

## 2017-01-22 NOTE — Progress Notes (Signed)
ANTICOAGULATION CONSULT NOTE - [place] for argatroban (warfarin on hold) Indication: atrial fibrillation (history of HIT)  Allergies  Allergen Reactions  . Heparin Other (See Comments)    UNSPECIFIED REACTION :  On Coumadin since 2004 ? ? HIT ? ?   Vital Signs: Temp: 98.2 F (36.8 C) (10/21 0420) Temp Source: Oral (10/21 0420) BP: 130/81 (10/21 0854) Pulse Rate: 92 (10/21 0854)  Labs:  Recent Labs  01/20/17 0931 01/20/17 1000  01/20/17 1015  01/21/17 0549 01/21/17 1228 01/21/17 1452 01/22/17 0257  HGB  --   --   < > 11.6*  --  12.2*  --   --  12.6*  HCT  --   --   --  36.0*  --  38.4*  --   --  39.1  PLT  --   --   --  109*  --  118*  --   --  109*  APTT  --   --   --   --   < > 45* 69* 66* 63*  LABPROT 28.2*  --   --   --   --  21.4*  --   --  22.1*  INR 2.67  --   --   --   --  1.88  --   --  1.96  CREATININE  --  16.34*  --   --   --  8.84*  --   --  6.31*  < > = values in this interval not displayed.  Estimated Creatinine Clearance: 17 mL/min (A) (by C-G formula based on SCr of 6.31 mg/dL (H)).  Medications: Argatroban @ 0.69mcg/kg/min  Assessment: 46 yo male with history of ESRD on HD admitted with chest pain. On warfarin PTA for history of afib with last dose on 10/16. Pharmacy consulted to dose argatroban (when INR <2) while warfarin on hold d/t history of HIT. HIT panel NEGATIVE.  Patient evaluated by CVTS d/t history of aortic dissection. Per Dr. Everrett Coombe note, non-operative at this time.   APTT is therapeutic at 63 seconds. CBC stable. No bleeding.  PTA warfarin dose: 5mg  MonFri and 7.5 mg all other days.   Goal of Therapy:  aPTT range of 50-90 seconds Monitor platelets by anticoagulation protocol: Yes   Plan:  1) Continue argatroban at 0.25 mcg/kg/min using weight of 84.8 kg 2) Daily INR, aPTT, CBC, and s/sx of bleeding  3) Follow up resuming warfarin   Nena Jordan, PharmD, BCPS 01/22/17 11:14 AM

## 2017-01-22 NOTE — Progress Notes (Signed)
Admitted from 2 [facility] per wheelchair,alert and oriented,Introduced self,NT,put in the bed and hooked to monitor.

## 2017-01-23 DIAGNOSIS — R072 Precordial pain: Secondary | ICD-10-CM

## 2017-01-23 LAB — PROTIME-INR
INR: 1.79
PROTHROMBIN TIME: 20.7 s — AB (ref 11.4–15.2)

## 2017-01-23 LAB — CBC
HCT: 37.4 % — ABNORMAL LOW (ref 39.0–52.0)
Hemoglobin: 11.7 g/dL — ABNORMAL LOW (ref 13.0–17.0)
MCH: 30.2 pg (ref 26.0–34.0)
MCHC: 31.3 g/dL (ref 30.0–36.0)
MCV: 96.6 fL (ref 78.0–100.0)
PLATELETS: 117 10*3/uL — AB (ref 150–400)
RBC: 3.87 MIL/uL — ABNORMAL LOW (ref 4.22–5.81)
RDW: 16.6 % — AB (ref 11.5–15.5)
WBC: 4.9 10*3/uL (ref 4.0–10.5)

## 2017-01-23 LAB — APTT: APTT: 63 s — AB (ref 24–36)

## 2017-01-23 MED ORDER — LABETALOL HCL 100 MG PO TABS
100.0000 mg | ORAL_TABLET | Freq: Two times a day (BID) | ORAL | Status: DC
  Filled 2017-01-23: qty 1

## 2017-01-23 MED ORDER — ACETAMINOPHEN 325 MG PO TABS: 650.0000 mg | ORAL_TABLET | Freq: Four times a day (QID) | ORAL | Status: DC | PRN

## 2017-01-23 MED ORDER — WARFARIN - PHARMACIST DOSING INPATIENT
Freq: Every day | Status: DC
  Administered 2017-01-23: 18:00:00

## 2017-01-23 MED ORDER — OXYCODONE HCL 5 MG PO TABS
5.0000 mg | ORAL_TABLET | ORAL | Status: DC | PRN
  Administered 2017-01-23: 5 mg via ORAL
  Administered 2017-01-24 – 2017-01-26 (×3): 10 mg via ORAL
  Filled 2017-01-23 (×2): qty 2
  Filled 2017-01-23: qty 1
  Filled 2017-01-23: qty 2

## 2017-01-23 MED ORDER — TRAMADOL HCL 50 MG PO TABS: 50.0000 mg | ORAL_TABLET | Freq: Three times a day (TID) | ORAL | Status: DC | PRN

## 2017-01-23 MED ORDER — TRAMADOL HCL 50 MG PO TABS: 50.0000 mg | ORAL_TABLET | Freq: Two times a day (BID) | ORAL | Status: DC | PRN

## 2017-01-23 MED ORDER — WARFARIN SODIUM 7.5 MG PO TABS
7.5000 mg | ORAL_TABLET | Freq: Once | ORAL | Status: AC
  Administered 2017-01-23: 7.5 mg via ORAL
  Filled 2017-01-23: qty 1

## 2017-01-23 MED ORDER — LABETALOL HCL 100 MG PO TABS
100.0000 mg | ORAL_TABLET | Freq: Two times a day (BID) | ORAL | Status: DC
  Administered 2017-01-24 – 2017-01-25 (×2): 100 mg via ORAL
  Filled 2017-01-23 (×4): qty 1

## 2017-01-23 NOTE — Progress Notes (Signed)
[facility] Rounding Note  Subjective:   Woke him up from sleep (did not sleep last night) Still with "annoying pain" Says he constantly worries that his dissection will recur, on his mind all the time  Objective Vital signs in last 24 hours: Vitals:   01/23/17 0400 01/23/17 0500 01/23/17 0748 01/23/17 0947  BP:   100/60 114/67  Pulse: 68  73 78  Resp: 17  13 17   Temp:   98 F (36.7 C)   TempSrc:   Oral   SpO2: 97%  97% 100%  Weight:  85.6 kg (188 lb 11.4 oz)    Height:       Weight change: 0 kg (0 lb)  Physical Exam: General: Woke him up. Very nice, soft spoken Regular rhythm S1S2 No S3 Clear lungs R IJ TDC clean and dry No focal abd tenderness Ext's without edema but very dysesthetic to touch Dialysis Access: R IJ TDC  Left upper arm extensive scarring 2/2 prior AVF and calciphylaxis wounds    Recent Labs Lab 01/20/17 1000 01/21/17 0549 01/22/17 0257 01/22/17 1324  NA 139 134* 136 134*  K 4.1 3.7 3.8 3.9  CL 102 98* 100* 100*  CO2 21* 24 23 25   GLUCOSE 116* 85 110* 102*  BUN 62* 25* 19 21*  CREATININE 16.34* 8.84* 6.31* 7.32*  CALCIUM 8.1* 8.8* 9.0 8.8*  PHOS 6.7* 6.1* 5.5*  --     Recent Labs Lab 01/18/17 1401 01/20/17 1000 01/21/17 0549 01/22/17 0257  AST 18  --   --   --   ALT 10*  --   --   --   ALKPHOS 495*  --   --   --   BILITOT 1.3*  --   --   --   PROT 7.2  --   --   --   ALBUMIN 3.7 3.0* 2.9* 3.0*    Recent Labs Lab 01/18/17 1401  LIPASE 47     Recent Labs Lab 01/20/17 1015 01/21/17 0549 01/22/17 0257 01/22/17 1324 01/23/17 0236  WBC 5.4 5.3 4.9 5.3 4.9  NEUTROABS  --  3.3 3.0  --   --   HGB 11.6* 12.2* 12.6* 12.4* 11.7*  HCT 36.0* 38.4* 39.1 39.4 37.4*  MCV 94.7 95.5 96.3 97.0 96.6  PLT 109* 118* 109* 129* 117*   Studies/Results: Ct Angio Chest/abd/pel For Dissection W And/or W/wo  Result Date: 01/22/2017 CLINICAL DATA:  Thoracoabdominal aortic dissection, persistent chest pain EXAM: CT ANGIOGRAPHY CHEST, ABDOMEN AND  PELVIS TECHNIQUE: Multidetector CT imaging through the chest, abdomen and pelvis was performed using the standard protocol during bolus administration of intravenous contrast. Multiplanar reconstructed images and MIPs were obtained and reviewed to evaluate the vascular anatomy. CONTRAST:  100 cc Isovue 370 COMPARISON:  01/18/2017 FINDINGS: CTA CHEST FINDINGS Cardiovascular: There is re- demonstration of a thoracoabdominal aortic dissection extending just beyond the left subclavian artery involving the descending thoracic aorta and extending into the abdominal aorta just inferior to the right renal artery. Findings compatible with a type B dissection. Grossly stable appearance compared 01/18/2017. Measuring at similar levels, the descending thoracic aorta has a maximal diameter of 4.1 cm. The true lumen remains the smaller of the 2. Mesenteric and right renal vasculature originating off of the smaller true lumen. Ascending thoracic aorta and major branch vessels remain uninvolved. Major branch vessels remain patent. No mediastinal hemorrhage or hematoma. Central pulmonary arteries are patent. Normal heart size. Native coronary atherosclerosis noted. No pericardial effusion. Right IJ dialysis catheter  tips within the proximal right atrium. Mediastinum/Nodes: No enlarged mediastinal, hilar, or axillary lymph nodes. Thyroid gland, trachea, and esophagus demonstrate no significant findings. Lungs/Pleura: Similar pattern of scattered bibasilar, lingula, right middle lobe atelectasis. No new acute airspace process, sickle collapse, consolidation. No significant edema. No pleural abnormality, pleural effusion, or pneumothorax. Musculoskeletal: No acute osseous finding. Diffuse increased osseous sclerosis compatible with renal osteodystrophy. No compression fracture. Intact sternum. Review of the MIP images confirms the above findings. CTA ABDOMEN AND PELVIS FINDINGS VASCULAR Aorta: Type B dissection extends to just inferior  to the right renal artery. Patient appears status post straight tube graft repair of the infrarenal aorta. Stable atherosclerotic change and wall irregularity of the abdominal aortic repair. Stable small focal outpouching of the distal anastomosis. No retroperitoneal hemorrhage or hematoma. Celiac, SMA, and right renal artery originate off the smaller true lumen. Celiac: Remains patent SMA: Remains patent Renals: Right renal artery remains patent. Patient is either status post left nephrectomy or has a congenitally absent left kidney. IMA: IMA remains patent inferiorly. Inflow: Iliac vasculature is patent but tortuous with mild atherosclerotic change. No occlusive process or inflow disease. Visualize common femoral, proximal profunda femoral, proximal superficial femoral arteries are patent. Veins: Venous imaging not performed. IVC within the infrarenal IVC as before. Review of the MIP images confirms the above findings. NON-VASCULAR Hepatobiliary: No focal liver abnormality is seen. No gallstones, gallbladder wall thickening, or biliary dilatation. Pancreas: Unremarkable. No pancreatic ductal dilatation or surrounding inflammatory changes. Spleen: Normal in size without focal abnormality. Adrenals/Urinary Tract: Adrenal glands appear normal. Left kidney is absent, either related to congenital absence versus prior nephrectomy. Right kidney demonstrates innumerable renal cysts compatible with a chronic cystic disease of dialysis. No hydronephrosis or obstruction. Bladder is collapsed. Stomach/Bowel: Negative bowel obstruction, significant dilatation, ileus, or free air. Normal appendix. No fluid collection or abscess. Colonic diverticulosis, most pronounced in the sigmoid. Lymphatic: No significant adenopathy. Reproductive: No significant or acute finding by CT. Prostate normal size. Seminal vesicles symmetric. Other: No abdominal wall hernia or abnormality. No abdominopelvic ascites. Musculoskeletal: Sclerosis of  the osseous structures compatible with renal osteodystrophy. Degenerative changes of the spine. No acute osseous finding. Review of the MIP images confirms the above findings. IMPRESSION: Stable type B thoracoabdominal aortic dissection compare to 01/18/2017. 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 Absent left kidney either congenital versus previous nephrectomy. Bibasilar atelectasis No other acute intra-abdominal or pelvic finding. Electronically Signed   By: Jerilynn Mages.  Shick M.D.   On: 01/22/2017 15:00   Medications: . sodium chloride    . sodium chloride 250 mL (01/22/17 0624)  . argatroban 0.25 mcg/kg/min (01/22/17 1246)   . gabapentin  200 mg Oral TID  . labetalol  100 mg Oral BID  . mouth rinse  15 mL Mouth Rinse BID  . sevelamer carbonate  1.6 g Oral TID WC  . sodium chloride flush  3 mL Intravenous Q12H   Dialysis Orders: TTS - [place] 3h 48min    85.5kg    2/2    R IJ TDC   Hep none Last Labs: 10/11:Hgb 13.0, P 5.7 9/27: TSAT 31%, K 3.9, Ca 8.2, Albumin 3.9  Assessment:  1. Aortic dissection - Type III 2004. Previous repair. CT 10/17 changes consistent with descending thoracic aortic dissection with apparent proximal and distal fenestrations. Slow flow is noted within the false lumen as described. Given the history of prior dissection and repair felt to  be chronic.  Surgery has seen, feels all chronic. Pain lingers. Repeat CT 10/21 stable since 10/17.  SBP goal <140. No operative intervention planned.  2. ESRD- TTS HD. Next HD tomorrow 3. Hypertension - BP well controlled, was on labetalol 300 bid at home, on 200 bid here and has been held a couple of times due to low BP's so dose lowered to 100 BID. At dry wt, euvolemic on exam.  4. Anemia-  Stable Hb. No ESA need at this time 5. Secondary hyperparathyroidism - P  6.1 Renvela 800 mg ^ 1600 mg +sensipar.  6. Nutrition- Renal/Carb modified  diet.  7. A Fib- Coumadin on hold per primary, has heparin allergy, is getting argatroban IV 8. Thrombocytopenia- stable 9. Elevated troponin -mild elevation, trending down. ECHO 10/18 EF 65-70. No AI. 10. Neuropathy/chronic pain- per primary    Jamal Maes, MD [facility] [phone] Pager 01/23/2017, 12:25 PM

## 2017-01-23 NOTE — Progress Notes (Signed)
ANTICOAGULATION CONSULT NOTE - [place] for argatroban (warfarin on hold) Indication: atrial fibrillation (history of HIT)  Allergies  Allergen Reactions  . Heparin Other (See Comments)    UNSPECIFIED REACTION :  On Coumadin since 2004 ? ? HIT ? ?  HIT panel negative 01/19/17   Vital Signs: Temp: 98 F (36.7 C) (10/22 0748) Temp Source: Oral (10/22 0748) BP: 102/71 (10/22 1251) Pulse Rate: 88 (10/22 1251)  Labs:  Recent Labs  01/21/17 0549  01/21/17 1452 01/22/17 0257 01/22/17 1324 01/23/17 0236  HGB 12.2*  --   --  12.6* 12.4* 11.7*  HCT 38.4*  --   --  39.1 39.4 37.4*  PLT 118*  --   --  109* 129* 117*  APTT 45*  < > 66* 63*  --  63*  LABPROT 21.4*  --   --  22.1* 21.3* 20.7*  INR 1.88  --   --  1.96 1.86 1.79  CREATININE 8.84*  --   --  6.31* 7.32*  --   < > = values in this interval not displayed.  Estimated Creatinine Clearance: 14.7 mL/min (A) (by C-G formula based on SCr of 7.32 mg/dL (H)).  Medications: Argatroban @ 0.70mcg/kg/min  Assessment: 46 yo male with history of ESRD on HD admitted with chest pain. On warfarin PTA for history of afib with last dose on 10/16. Pharmacy consulted to dose argatroban (when INR <2) while warfarin on hold d/t history of HIT. HIT panel NEGATIVE.  Patient evaluated by CVTS d/t history of aortic dissection. Per Dr. Everrett Coombe note, non-operative at this time.   APTT remains therapeutic: 63 seconds. HgB slight downtrend 12.4>11.7. No bleeding documented   PTA warfarin dose: 5mg  MonFri and 7.5 mg all other days.   Goal of Therapy:  aPTT range of 50-90 seconds Monitor platelets by anticoagulation protocol: Yes   Plan:  1) Continue argatroban at 0.25 mcg/kg/min using weight of 84.8 kg 2) Daily INR, aPTT, CBC, and s/sx of bleeding  3) Follow up resuming warfarin   [name], PharmD Clinical Pharmacist 01/23/2017 1:46 PM

## 2017-01-23 NOTE — Progress Notes (Signed)
ANTICOAGULATION CONSULT NOTE - [place] for warfarin Indication: atrial fibrillation (history of HIT)  Allergies  Allergen Reactions  . Heparin Other (See Comments)    UNSPECIFIED REACTION :  On Coumadin since 2004 ? ? HIT ? ?  HIT panel negative 01/19/17   Vital Signs: Temp: 98.2 F (36.8 C) (10/22 1600) Temp Source: Oral (10/22 1600) BP: 122/74 (10/22 1600) Pulse Rate: 68 (10/22 1600)  Labs:  Recent Labs  01/21/17 0549  01/21/17 1452 01/22/17 0257 01/22/17 1324 01/23/17 0236  HGB 12.2*  --   --  12.6* 12.4* 11.7*  HCT 38.4*  --   --  39.1 39.4 37.4*  PLT 118*  --   --  109* 129* 117*  APTT 45*  < > 66* 63*  --  63*  LABPROT 21.4*  --   --  22.1* 21.3* 20.7*  INR 1.88  --   --  1.96 1.86 1.79  CREATININE 8.84*  --   --  6.31* 7.32*  --   < > = values in this interval not displayed.  Estimated Creatinine Clearance: 14.7 mL/min (A) (by C-G formula based on SCr of 7.32 mg/dL (H)).  Medications: Argatroban @ 0.29mcg/kg/min  Assessment: 46 yo male with history of ESRD on HD admitted with chest pain. On warfarin PTA for history of afib with last dose on 10/16. Pharmacy consulted to dose argatroban (when INR <2) while warfarin on hold d/t history of HIT. HIT panel NEGATIVE.  Pharmacy consulted to resume warfarin. INR is subtherapeutic at 1.79. Once INR > 4, will need to stop argatroban and check INR 4-6 hr later and ensure INR >2.   PTA warfarin dose: 5mg  MonFri and 7.5 mg all other days.   Goal of Therapy:  INR 2-3 Monitor platelets by anticoagulation protocol: Yes   Plan:  - Warfarin 7.5 mg PO tonight - Daily INR - Monitor for s/sx of bleeding   Renold Genta, PharmD, BCPS Clinical Pharmacist Phone for today - [place] - [phone] 01/23/2017 5:09 PM

## 2017-01-23 NOTE — Care Management Important Message (Signed)
Important Message  Patient Details  Name: Corey [name] MRN: [id number] Date of Birth: Jan 14, 1971   Medicare Important Message Given:  Yes    Silvio Sausedo Abena 01/23/2017, 11:23 AM

## 2017-01-23 NOTE — Progress Notes (Signed)
[place] TEAM 1 - Stepdown/ICU TEAM  Corey [name]  GYI:[id number] DOB: [birth year] DOA: 01/18/2017 PCP: Patient, No Pcp Per    Brief Narrative:  46 yr old M w/ a Hxof ESRD on HD T/Th/Sat,A-fib S/P Cardioversion 2014,Calciphylaxis of LUE, Aortic Dissection 2004 >lower ext paralysis (wheelchair bound), HTN, and Peripheral Neuropathy who presented with dull chest pain which radiates to his back.  In the ED CT chest showed Acute on Chronic dissection.   Subjective: The patient is sitting up at the side of the bed eating his dinner.  He has complained of intermittent chest pain all day long.  He reports this pain is the same pain that led to him coming to the emergency room.  He admits to feeling very anxious about his thoracic aortic dissection and fear that it will begin to leak or dissect further.  He denies shortness of breath nausea vomiting or abdominal pain.  Assessment & Plan:  ?Acute on Chronic Descending Thoracic Aortic Dissection CT noted "difficult to exclude acute component" - had surgical repair of Type 3 (abdom) in 2004 - goal is to keep SBP < 140 - no role for surgery per TCTS - repeat CT 10/21 due to pain confirmed is stable   Ongoing chest pain ?if pain is directly related to dissection, as the pain is a new issue and the dissection appears to be chronic - resumed his neurontin - adjust pain meds - follow   Chronic diastolic CHF Volume management per HD - not grossly overloaded on exam   Elevated Troponin No wall motion abnormality on TTE - trop did not reach a signif peak   Chronic atrial fibrillation On warfarin as outpt - no surgery planned - resume coumadin per pharmacy - is reportedly allergic to heparin therefore using argatroban for bridge    ESRD on HD T/Th/Sat per Nephrology  Thrombocytopenia Stable   Peripheral Neuropathy 10/20 increasedNeurontin to 200mg  TID    DVT prophylaxis: argatroban Code Status: FULL CODE Family Communication: no family  present at time of exam  Disposition Plan: home when pain controlled   Consultants:  Nephrology TCTS  Antimicrobials:  none   Objective: Blood pressure 102/71, pulse 76, temperature 98 F (36.7 C), temperature source Oral, resp. rate 18, height 6\' 2"  (1.88 m), weight 85.6 kg (188 lb 11.4 oz), SpO2 99 %.  Intake/Output Summary (Last 24 hours) at 01/23/17 1621 Last data filed at 01/23/17 1200  Gross per 24 hour  Intake           470.12 ml  Output                0 ml  Net           470.12 ml   Filed Weights   01/21/17 1217 01/22/17 0420 01/23/17 0500  Weight: 85.6 kg (188 lb 11.4 oz) 84.8 kg (186 lb 15.2 oz) 85.6 kg (188 lb 11.4 oz)    Examination: General: No acute respiratory distress Lungs: Clear to auscultation bilaterally without wheezes or crackles Cardiovascular: Regular rate and rhythm without murmur gallop or rub normal S1 and S2 Abdomen: Nontender, nondistended, soft, bowel sounds positive, no rebound, no ascites, no appreciable mass Extremities: No significant cyanosis, clubbing, or edema bilateral lower extremities  CBC:  Recent Labs Lab 01/20/17 1015 01/21/17 0549 01/22/17 0257 01/22/17 1324 01/23/17 0236  WBC 5.4 5.3 4.9 5.3 4.9  NEUTROABS  --  3.3 3.0  --   --   HGB 11.6* 12.2* 12.6* 12.4* 11.7*  HCT 36.0* 38.4* 39.1 39.4 37.4*  MCV 94.7 95.5 96.3 97.0 96.6  PLT 109* 118* 109* 129* 789*   Basic Metabolic Panel:  Recent Labs Lab 01/19/17 0158 01/20/17 1000 01/21/17 0549 01/22/17 0257 01/22/17 1324  NA 139 139 134* 136 134*  K 3.6 4.1 3.7 3.8 3.9  CL 103 102 98* 100* 100*  CO2 21* 21* 24 23 25   GLUCOSE 87 116* 85 110* 102*  BUN 49* 62* 25* 19 21*  CREATININE 13.83* 16.34* 8.84* 6.31* 7.32*  CALCIUM 8.5* 8.1* 8.8* 9.0 8.8*  MG 2.0  --  1.9 1.9 1.9  PHOS 6.1* 6.7* 6.1* 5.5*  --    GFR: Estimated Creatinine Clearance: 14.7 mL/min (A) (by C-G formula based on SCr of 7.32 mg/dL (H)).  Liver Function Tests:  Recent Labs Lab 01/18/17 1401  01/20/17 1000 01/21/17 0549 01/22/17 0257  AST 18  --   --   --   ALT 10*  --   --   --   ALKPHOS 495*  --   --   --   BILITOT 1.3*  --   --   --   PROT 7.2  --   --   --   ALBUMIN 3.7 3.0* 2.9* 3.0*    Recent Labs Lab 01/18/17 1401  LIPASE 47   Coagulation Profile:  Recent Labs Lab 01/20/17 0931 01/21/17 0549 01/22/17 0257 01/22/17 1324 01/23/17 0236  INR 2.67 1.88 1.96 1.86 1.79    Recent Results (from the past 240 hour(s))  MRSA PCR Screening     Status: None   Collection Time: 01/19/17  1:45 AM  Result Value Ref Range Status   MRSA by PCR NEGATIVE NEGATIVE Final    Comment:        The GeneXpert MRSA Assay (FDA approved for NASAL specimens only), is one component of a comprehensive MRSA colonization surveillance program. It is not intended to diagnose MRSA infection nor to guide or monitor treatment for MRSA infections.      Scheduled Meds: . gabapentin  200 mg Oral TID  . labetalol  100 mg Oral BID  . mouth rinse  15 mL Mouth Rinse BID  . sevelamer carbonate  1.6 g Oral TID WC  . sodium chloride flush  3 mL Intravenous Q12H     LOS: 5 days   Cherene Altes, MD [facility] Office  [phone] Pager - Text Page per Shea Evans as per below:  On-Call/Text Page:      [email or web address]      password [email or web address]  If 7PM-7AM, please contact night-coverage [email or web address] Password [email or web address] 01/23/2017, 4:22 PM

## 2017-01-24 LAB — RENAL FUNCTION PANEL
ALBUMIN: 3.1 g/dL — AB (ref 3.5–5.0)
ANION GAP: 15 (ref 5–15)
BUN: 42 mg/dL — ABNORMAL HIGH (ref 6–20)
CALCIUM: 8.9 mg/dL (ref 8.9–10.3)
CO2: 20 mmol/L — ABNORMAL LOW (ref 22–32)
Chloride: 101 mmol/L (ref 101–111)
Creatinine, Ser: 10.75 mg/dL — ABNORMAL HIGH (ref 0.61–1.24)
GFR, EST AFRICAN AMERICAN: 6 mL/min — AB (ref >60)
GFR, EST NON AFRICAN AMERICAN: 5 mL/min — AB (ref >60)
GLUCOSE: 87 mg/dL (ref 65–99)
Phosphorus: 6.9 mg/dL — ABNORMAL HIGH (ref 2.5–4.6)
Potassium: 4.6 mmol/L (ref 3.5–5.1)
SODIUM: 136 mmol/L (ref 135–145)

## 2017-01-24 LAB — CBC
HCT: 39.4 % (ref 39.0–52.0)
HCT: 35.5 % — ABNORMAL LOW (ref 39.0–52.0)
HEMOGLOBIN: 10.9 g/dL — AB (ref 13.0–17.0)
Hemoglobin: 12.3 g/dL — ABNORMAL LOW (ref 13.0–17.0)
MCH: 30.4 pg (ref 26.0–34.0)
MCH: 29.9 pg (ref 26.0–34.0)
MCHC: 31.2 g/dL (ref 30.0–36.0)
MCHC: 30.7 g/dL (ref 30.0–36.0)
MCV: 97.5 fL (ref 78.0–100.0)
MCV: 97.3 fL (ref 78.0–100.0)
PLATELETS: 137 10*3/uL — AB (ref 150–400)
Platelets: 126 10*3/uL — ABNORMAL LOW (ref 150–400)
RBC: 4.04 MIL/uL — AB (ref 4.22–5.81)
RBC: 3.65 MIL/uL — AB (ref 4.22–5.81)
RDW: 16.3 % — AB (ref 11.5–15.5)
RDW: 16.4 % — ABNORMAL HIGH (ref 11.5–15.5)
WBC: 6.5 10*3/uL (ref 4.0–10.5)
WBC: 5.9 10*3/uL (ref 4.0–10.5)

## 2017-01-24 LAB — APTT: APTT: 56 s — AB (ref 24–36)

## 2017-01-24 LAB — PROTIME-INR
INR: 1.48
PROTHROMBIN TIME: 17.8 s — AB (ref 11.4–15.2)

## 2017-01-24 MED ORDER — WARFARIN SODIUM 10 MG PO TABS
10.0000 mg | ORAL_TABLET | Freq: Once | ORAL | Status: AC
  Administered 2017-01-24: 10 mg via ORAL
  Filled 2017-01-24 (×3): qty 1

## 2017-01-24 MED ORDER — ANTICOAGULANT SODIUM CITRATE 4% (200MG/5ML) IV SOLN
5.0000 mL | Freq: Once | Status: AC
  Administered 2017-01-26: 5 mL via INTRAVENOUS
  Filled 2017-01-24 (×2): qty 250

## 2017-01-24 NOTE — Progress Notes (Signed)
[facility] TEAM 1 - Stepdown/ICU TEAM  Corey [name]  HKV:[id number] DOB: 06-Apr-1970 DOA: 01/18/2017 PCP: Patient, No Pcp Per    Brief Narrative:  46 yr old M w/ a Hxof ESRD on HD T/Th/Sat,A-fib S/P Cardioversion 2014,Calciphylaxis of LUE, Aortic Dissection 2004 >lower ext paralysis (wheelchair bound), HTN, and Peripheral Neuropathy who presented with dull chest pain which radiates to his back.  In the ED CT chest showed Acute on Chronic dissection.   Subjective: Resting comfortably in his room post HD.  No respiratory distress or uncontrolled pain at the time of my visit.    Assessment & Plan:  ?Acute on Chronic Descending Thoracic Aortic Dissection CT noted "difficult to exclude acute component" - had surgical repair of Type 3 (abdom) in 2004 - goal is to keep SBP < 140 - no role for surgery per TCTS - repeat CT 10/21 due to pain confirmed is stable - clinically appears stable at this time - BP at goal   Ongoing chest pain ?if pain is directly related to dissection, as the pain is a new issue and the dissection appears to be chronic - resumed his neurontin - follow w/o change in tx today   Chronic diastolic CHF Volume management per HD - not grossly overloaded on exam - s/p HD today   Elevated Troponin No wall motion abnormality on TTE - trop did not reach a signif peak   Chronic atrial fibrillation On warfarin as outpt - no surgery planned - resumed coumadin per pharmacy 10/22 - is reportedly allergic to heparin therefore using argatroban for bridge - f/u INR in AM and consider if d/c w/o bridge is reasonable after discussing risk of CVA w/ pt   ESRD on HD T/Th/Sat per Nephrology  Thrombocytopenia Stable   Peripheral Neuropathy 10/20 increasedNeurontin to 200mg  TID    DVT prophylaxis: argatroban Code Status: FULL CODE Family Communication: no family present at time of exam  Disposition Plan: home when pain controlled and INR at goal, or pt willing to accept risk of  no anticoag bridge  Consultants:  Nephrology TCTS  Antimicrobials:  none   Objective: Blood pressure 121/78, pulse 84, temperature 98.7 F (37.1 C), temperature source Oral, resp. rate (!) 21, height 6\' 2"  (1.88 m), weight 84.5 kg (186 lb 4.6 oz), SpO2 100 %.  Intake/Output Summary (Last 24 hours) at 01/24/17 1610 Last data filed at 01/24/17 1300  Gross per 24 hour  Intake           612.85 ml  Output             2000 ml  Net         -1387.15 ml   Filed Weights   01/24/17 0510 01/24/17 0758 01/24/17 1158  Weight: 88 kg (193 lb 14.4 oz) 86.4 kg (190 lb 7.6 oz) 84.5 kg (186 lb 4.6 oz)    Examination: General: No acute respiratory distress Lungs: Clear to auscultation Cardiovascular: Regular rate and rhythm  Extremities: No significant edema bilateral lower extremities  CBC:  Recent Labs Lab 01/21/17 0549 01/22/17 0257 01/22/17 1324 01/23/17 0236 01/24/17 0232 01/24/17 0701  WBC 5.3 4.9 5.3 4.9 6.5 5.9  NEUTROABS 3.3 3.0  --   --   --   --   HGB 12.2* 12.6* 12.4* 11.7* 12.3* 10.9*  HCT 38.4* 39.1 39.4 37.4* 39.4 35.5*  MCV 95.5 96.3 97.0 96.6 97.5 97.3  PLT 118* 109* 129* 117* 137* 564*   Basic Metabolic Panel:  Recent Labs Lab 01/19/17  0158 01/20/17 1000 01/21/17 0549 01/22/17 0257 01/22/17 1324 01/24/17 0701  NA 139 139 134* 136 134* 136  K 3.6 4.1 3.7 3.8 3.9 4.6  CL 103 102 98* 100* 100* 101  CO2 21* 21* 24 23 25  20*  GLUCOSE 87 116* 85 110* 102* 87  BUN 49* 62* 25* 19 21* 42*  CREATININE 13.83* 16.34* 8.84* 6.31* 7.32* 10.75*  CALCIUM 8.5* 8.1* 8.8* 9.0 8.8* 8.9  MG 2.0  --  1.9 1.9 1.9  --   PHOS 6.1* 6.7* 6.1* 5.5*  --  6.9*   GFR: Estimated Creatinine Clearance: 10 mL/min (A) (by C-G formula based on SCr of 10.75 mg/dL (H)).  Liver Function Tests:  Recent Labs Lab 01/18/17 1401 01/20/17 1000 01/21/17 0549 01/22/17 0257 01/24/17 0701  AST 18  --   --   --   --   ALT 10*  --   --   --   --   ALKPHOS 495*  --   --   --   --     BILITOT 1.3*  --   --   --   --   PROT 7.2  --   --   --   --   ALBUMIN 3.7 3.0* 2.9* 3.0* 3.1*    Recent Labs Lab 01/18/17 1401  LIPASE 47   Coagulation Profile:  Recent Labs Lab 01/21/17 0549 01/22/17 0257 01/22/17 1324 01/23/17 0236 01/24/17 0232  INR 1.88 1.96 1.86 1.79 1.48    Recent Results (from the past 240 hour(s))  MRSA PCR Screening     Status: None   Collection Time: 01/19/17  1:45 AM  Result Value Ref Range Status   MRSA by PCR NEGATIVE NEGATIVE Final    Comment:        The GeneXpert MRSA Assay (FDA approved for NASAL specimens only), is one component of a comprehensive MRSA colonization surveillance program. It is not intended to diagnose MRSA infection nor to guide or monitor treatment for MRSA infections.      Scheduled Meds: . gabapentin  200 mg Oral TID  . labetalol  100 mg Oral BID  . mouth rinse  15 mL Mouth Rinse BID  . sevelamer carbonate  1.6 g Oral TID WC  . warfarin  10 mg Oral ONCE-1800  . Warfarin - Pharmacist Dosing Inpatient   Does not apply q1800     LOS: 6 days   Cherene Altes, MD [facility] Office  [phone] Pager - Text Page per Amion as per below:  On-Call/Text Page:      [email or web address]      password [email or web address]  If 7PM-7AM, please contact night-coverage [email or web address] Password [email or web address] 01/24/2017, 4:10 PM

## 2017-01-24 NOTE — Progress Notes (Signed)
[facility] Rounding Note  Subjective:   Didn't sleep well "my mind is too busy" Says he constantly worries that his dissection will recur, on his mind all the time - worries about his daughters  Objective Vital signs in last 24 hours: Vitals:   01/23/17 1600 01/23/17 2048 01/23/17 2353 01/24/17 0510  BP: 122/74 118/77 115/78 101/71  Pulse: 68 89  82  Resp: 12 (!) 21  16  Temp: 98.2 F (36.8 C) 98.1 F (36.7 C) 97.8 F (36.6 C) 98.2 F (36.8 C)  TempSrc: Oral Oral Oral Oral  SpO2: 92% 99%  100%  Weight:    88 kg (193 lb 14.4 oz)  Height:       Weight change: 2.352 kg (5 lb 3 oz)  Physical Exam: Seen in HD Still nagging chest pain R IJ TDC in use for HD Regular rhythm S1S2 No S3 Clear lungs No focal abd tenderness Ext's without edema but very dysesthetic to touch Left upper arm extensive scarring 2/2 prior AVF and calciphylaxis wounds    Recent Labs Lab 01/21/17 0549 01/22/17 0257 01/22/17 1324 01/24/17 0701  NA 134* 136 134* 136  K 3.7 3.8 3.9 4.6  CL 98* 100* 100* 101  CO2 24 23 25  20*  GLUCOSE 85 110* 102* 87  BUN 25* 19 21* 42*  CREATININE 8.84* 6.31* 7.32* 10.75*  CALCIUM 8.8* 9.0 8.8* 8.9  PHOS 6.1* 5.5*  --  6.9*    Recent Labs Lab 01/18/17 1401  01/21/17 0549 01/22/17 0257 01/24/17 0701  AST 18  --   --   --   --   ALT 10*  --   --   --   --   ALKPHOS 495*  --   --   --   --   BILITOT 1.3*  --   --   --   --   PROT 7.2  --   --   --   --   ALBUMIN 3.7  < > 2.9* 3.0* 3.1*  < > = values in this interval not displayed.  Recent Labs Lab 01/18/17 1401  LIPASE 47     Recent Labs Lab 01/21/17 0549 01/22/17 0257 01/22/17 1324 01/23/17 0236 01/24/17 0232 01/24/17 0701  WBC 5.3 4.9 5.3 4.9 6.5 5.9  NEUTROABS 3.3 3.0  --   --   --   --   HGB 12.2* 12.6* 12.4* 11.7* 12.3* 10.9*  HCT 38.4* 39.1 39.4 37.4* 39.4 35.5*  MCV 95.5 96.3 97.0 96.6 97.5 97.3  PLT 118* 109* 129* 117* 137* 126*   Studies/Results: Ct Angio Chest/abd/pel For Dissection  W And/or W/wo  Result Date: 01/22/2017 CLINICAL DATA:  Thoracoabdominal aortic dissection, persistent chest pain EXAM: CT ANGIOGRAPHY CHEST, ABDOMEN AND PELVIS TECHNIQUE: Multidetector CT imaging through the chest, abdomen and pelvis was performed using the standard protocol during bolus administration of intravenous contrast. Multiplanar reconstructed images and MIPs were obtained and reviewed to evaluate the vascular anatomy. CONTRAST:  100 cc Isovue 370 COMPARISON:  01/18/2017 FINDINGS: CTA CHEST FINDINGS Cardiovascular: There is re- demonstration of a thoracoabdominal aortic dissection extending just beyond the left subclavian artery involving the descending thoracic aorta and extending into the abdominal aorta just inferior to the right renal artery. Findings compatible with a type B dissection. Grossly stable appearance compared 01/18/2017. Measuring at similar levels, the descending thoracic aorta has a maximal diameter of 4.1 cm. The true lumen remains the smaller of the 2. Mesenteric and right renal vasculature originating off of  the smaller true lumen. Ascending thoracic aorta and major branch vessels remain uninvolved. Major branch vessels remain patent. No mediastinal hemorrhage or hematoma. Central pulmonary arteries are patent. Normal heart size. Native coronary atherosclerosis noted. No pericardial effusion. Right IJ dialysis catheter tips within the proximal right atrium. Mediastinum/Nodes: No enlarged mediastinal, hilar, or axillary lymph nodes. Thyroid gland, trachea, and esophagus demonstrate no significant findings. Lungs/Pleura: Similar pattern of scattered bibasilar, lingula, right middle lobe atelectasis. No new acute airspace process, sickle collapse, consolidation. No significant edema. No pleural abnormality, pleural effusion, or pneumothorax. Musculoskeletal: No acute osseous finding. Diffuse increased osseous sclerosis compatible with renal osteodystrophy. No compression fracture.  Intact sternum. Review of the MIP images confirms the above findings. CTA ABDOMEN AND PELVIS FINDINGS VASCULAR Aorta: Type B dissection extends to just inferior to the right renal artery. Patient appears status post straight tube graft repair of the infrarenal aorta. Stable atherosclerotic change and wall irregularity of the abdominal aortic repair. Stable small focal outpouching of the distal anastomosis. No retroperitoneal hemorrhage or hematoma. Celiac, SMA, and right renal artery originate off the smaller true lumen. Celiac: Remains patent SMA: Remains patent Renals: Right renal artery remains patent. Patient is either status post left nephrectomy or has a congenitally absent left kidney. IMA: IMA remains patent inferiorly. Inflow: Iliac vasculature is patent but tortuous with mild atherosclerotic change. No occlusive process or inflow disease. Visualize common femoral, proximal profunda femoral, proximal superficial femoral arteries are patent. Veins: Venous imaging not performed. IVC within the infrarenal IVC as before. Review of the MIP images confirms the above findings. NON-VASCULAR Hepatobiliary: No focal liver abnormality is seen. No gallstones, gallbladder wall thickening, or biliary dilatation. Pancreas: Unremarkable. No pancreatic ductal dilatation or surrounding inflammatory changes. Spleen: Normal in size without focal abnormality. Adrenals/Urinary Tract: Adrenal glands appear normal. Left kidney is absent, either related to congenital absence versus prior nephrectomy. Right kidney demonstrates innumerable renal cysts compatible with a chronic cystic disease of dialysis. No hydronephrosis or obstruction. Bladder is collapsed. Stomach/Bowel: Negative bowel obstruction, significant dilatation, ileus, or free air. Normal appendix. No fluid collection or abscess. Colonic diverticulosis, most pronounced in the sigmoid. Lymphatic: No significant adenopathy. Reproductive: No significant or acute finding by  CT. Prostate normal size. Seminal vesicles symmetric. Other: No abdominal wall hernia or abnormality. No abdominopelvic ascites. Musculoskeletal: Sclerosis of the osseous structures compatible with renal osteodystrophy. Degenerative changes of the spine. No acute osseous finding. Review of the MIP images confirms the above findings. IMPRESSION: Stable type B thoracoabdominal aortic dissection compare to 01/18/2017. 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 Absent left kidney either congenital versus previous nephrectomy. Bibasilar atelectasis No other acute intra-abdominal or pelvic finding. Electronically Signed   By: Jerilynn Mages.  Shick M.D.   On: 01/22/2017 15:00   Medications: . argatroban 0.25 mcg/kg/min (01/24/17 0153)   . gabapentin  200 mg Oral TID  . labetalol  100 mg Oral BID  . mouth rinse  15 mL Mouth Rinse BID  . sevelamer carbonate  1.6 g Oral TID WC  . warfarin  10 mg Oral ONCE-1800  . Warfarin - Pharmacist Dosing Inpatient   Does not apply q1800   Dialysis Orders: TTS - [place] 3h 48min    85.5kg    2/2    R IJ TDC   Hep none Last Labs: 10/11:Hgb 13.0, P 5.7 9/27: TSAT 31%, K 3.9, Ca 8.2, Albumin 3.9  Assessment:  1. Aortic dissection - Type  III 2004. Previous repair in [place]. CT 10/17 changes consistent with descending thoracic aortic dissection with apparent proximal and distal fenestration/slow flow noted within the false lumen as described. Given the history of prior dissection and repair felt to be chronic.  Surgery has seen, feels all chronic. Pain lingers. Repeat CT 10/21 stable/unchanged since 10/17.  SBP goal <140. No operative intervention planned.  2. ESRD- TTS HD. HD today on schedule 3. Hypertension - BP well controlled, was on labetalol 300 bid at home, on 200 bid here and has been held a couple of times due to low BP's so dose lowered to 100 BID. Euvolemic on exam.   4. Anemia-  Stable Hb. No ESA need at this time 5. Secondary hyperparathyroidism - P  6.1 Renvela 800 mg ^ 1600 mg +sensipar.  6. Nutrition- Renal/Carb modified diet.  7. A Fib- Coumadin on hold per primary, has heparin allergy, is getting argatroban IV 8. Thrombocytopenia- stable 9. Elevated troponin -mild elevation, trending down. ECHO 10/18 EF 65-70. No AI. 10. Neuropathy/chronic pain- per primary  11. Disposition - anticipate home soon   Jamal Maes, MD [facility] [phone] Pager 01/24/2017, 8:43 AM

## 2017-01-24 NOTE — Plan of Care (Signed)
Problem: Education: Goal: Knowledge of [facility] Education information/materials will improve Outcome: Progressing Discussed with patient importance of safety and to use call bell when needing to get up. Call bell within reach. Patient ambulated using walker to the bathroom and tolerated well.

## 2017-01-24 NOTE — Progress Notes (Signed)
ANTICOAGULATION CONSULT NOTE - [place] for warfarin Indication: atrial fibrillation (history of HIT)  Allergies  Allergen Reactions  . Heparin Other (See Comments)    UNSPECIFIED REACTION :  On Coumadin since 2004 ? ? HIT ? ?  HIT panel negative 01/19/17   Vital Signs: Temp: 98.2 F (36.8 C) (10/23 0510) Temp Source: Oral (10/23 0510) BP: 101/71 (10/23 0510) Pulse Rate: 82 (10/23 0510)  Labs:  Recent Labs  01/22/17 0257 01/22/17 1324 01/23/17 0236 01/24/17 0232 01/24/17 0701  HGB 12.6* 12.4* 11.7* 12.3*  --   HCT 39.1 39.4 37.4* 39.4  --   PLT 109* 129* 117* 137*  --   APTT 63*  --  63* 56*  --   LABPROT 22.1* 21.3* 20.7* 17.8*  --   INR 1.96 1.86 1.79 1.48  --   CREATININE 6.31* 7.32*  --   --  10.75*   Estimated Creatinine Clearance: 10 mL/min (A) (by C-G formula based on SCr of 10.75 mg/dL (H)).  Medications: Argatroban @ 0.43mcg/kg/min  Assessment: 46 yo male with history of ESRD on HD admitted with chest pain. On warfarin PTA for history of afib with last dose on 10/16. Pharmacy consulted to dose argatroban (when INR <2) while warfarin on hold d/t history of HIT. HIT panel NEGATIVE.  Pharmacy consulted to resume warfarin. Once INR > 4, will need to stop argatroban and check INR 4-6 hr later and ensure INR >2.   INR subtherapeutic: 1.48   PTA warfarin dose: 5mg  MonFri and 7.5 mg all other days.   Goal of Therapy:  INR 2-3 aPTT range of 50-90 seconds Monitor platelets by anticoagulation protocol: Yes   Plan:  - Warfarin 10mg  PO tonight - Daily INR, aPTT, and CBC - Monitor for s/sx of bleeding   [name], PharmD Clinical Pharmacist 01/24/2017 7:58 AM

## 2017-01-24 NOTE — Procedures (Signed)
I have personally attended this patient's dialysis session.   2K bath pending labs No heparin EDW 85.5 kg BP stable  Jamal Maes, MD [facility] [phone] Pager 01/24/2017, 8:58 AM

## 2017-01-25 LAB — CBC
HCT: 36.3 % — ABNORMAL LOW (ref 39.0–52.0)
Hemoglobin: 11 g/dL — ABNORMAL LOW (ref 13.0–17.0)
MCH: 30 pg (ref 26.0–34.0)
MCHC: 30.3 g/dL (ref 30.0–36.0)
MCV: 98.9 fL (ref 78.0–100.0)
PLATELETS: 125 10*3/uL — AB (ref 150–400)
RBC: 3.67 MIL/uL — ABNORMAL LOW (ref 4.22–5.81)
RDW: 16.6 % — AB (ref 11.5–15.5)
WBC: 6.7 10*3/uL (ref 4.0–10.5)

## 2017-01-25 LAB — APTT: APTT: 57 s — AB (ref 24–36)

## 2017-01-25 LAB — PROTIME-INR
INR: 1.5
PROTHROMBIN TIME: 18 s — AB (ref 11.4–15.2)

## 2017-01-25 MED ORDER — GABAPENTIN 300 MG PO CAPS
300.0000 mg | ORAL_CAPSULE | Freq: Three times a day (TID) | ORAL | Status: DC
  Administered 2017-01-25: 300 mg via ORAL
  Filled 2017-01-25: qty 1

## 2017-01-25 MED ORDER — CINACALCET HCL 30 MG PO TABS
60.0000 mg | ORAL_TABLET | Freq: Every day | ORAL | Status: DC
  Administered 2017-01-25 – 2017-01-26 (×2): 60 mg via ORAL
  Filled 2017-01-25 (×2): qty 2

## 2017-01-25 MED ORDER — WARFARIN SODIUM 10 MG PO TABS
10.0000 mg | ORAL_TABLET | Freq: Once | ORAL | Status: AC
  Administered 2017-01-25: 10 mg via ORAL
  Filled 2017-01-25: qty 1

## 2017-01-25 NOTE — Progress Notes (Signed)
[facility] Progress Note   Subjective:   Patient resting in bed. Continues to have CP. No new c/os.   Objective Vitals:   01/25/17 0525 01/25/17 0530 01/25/17 1028 01/25/17 1257  BP: 111/73  101/69 105/67  Pulse: 82  89 93  Resp: 15   18  Temp: 98.5 F (36.9 C)   97.6 F (36.4 C)  TempSrc: Oral   Oral  SpO2: 100%  96% 93%  Weight:  86.6 kg (190 lb 14.7 oz)    Height:       Physical Exam General:NAD, WDWN Heart:RRR, no murmur rub or gallop Lungs:CTAB Abdomen:NT, +BS, soft Extremities:trace b/l edema, +tenderness Dialysis Access: R IJ [facility]   Filed Weights   01/24/17 0758 01/24/17 1158 01/25/17 0530  Weight: 86.4 kg (190 lb 7.6 oz) 84.5 kg (186 lb 4.6 oz) 86.6 kg (190 lb 14.7 oz)    Intake/Output Summary (Last 24 hours) at 01/25/17 1325 Last data filed at 01/25/17 1300  Gross per 24 hour  Intake          1116.55 ml  Output                0 ml  Net          1116.55 ml    Additional Objective Labs: Basic Metabolic Panel:  Recent Labs Lab 01/21/17 0549 01/22/17 0257 01/22/17 1324 01/24/17 0701  NA 134* 136 134* 136  K 3.7 3.8 3.9 4.6  CL 98* 100* 100* 101  CO2 24 23 25  20*  GLUCOSE 85 110* 102* 87  BUN 25* 19 21* 42*  CREATININE 8.84* 6.31* 7.32* 10.75*  CALCIUM 8.8* 9.0 8.8* 8.9  PHOS 6.1* 5.5*  --  6.9*   Liver Function Tests:  Recent Labs Lab 01/18/17 1401  01/21/17 0549 01/22/17 0257 01/24/17 0701  AST 18  --   --   --   --   ALT 10*  --   --   --   --   ALKPHOS 495*  --   --   --   --   BILITOT 1.3*  --   --   --   --   PROT 7.2  --   --   --   --   ALBUMIN 3.7  < > 2.9* 3.0* 3.1*  < > = values in this interval not displayed.  Recent Labs Lab 01/18/17 1401  LIPASE 47   CBC:  Recent Labs Lab 01/21/17 0549 01/22/17 0257 01/22/17 1324 01/23/17 0236 01/24/17 0232 01/24/17 0701 01/25/17 0333  WBC 5.3 4.9 5.3 4.9 6.5 5.9 6.7  NEUTROABS 3.3 3.0  --   --   --   --   --   HGB 12.2* 12.6* 12.4* 11.7* 12.3* 10.9* 11.0*   HCT 38.4* 39.1 39.4 37.4* 39.4 35.5* 36.3*  MCV 95.5 96.3 97.0 96.6 97.5 97.3 98.9  PLT 118* 109* 129* 117* 137* 126* 125*    Lab Results  Component Value Date   INR 1.50 01/25/2017   INR 1.48 01/24/2017   INR 1.79 01/23/2017   Medications: . anticoagulant sodium citrate    . argatroban 0.25 mcg/kg/min (01/25/17 0500)   . gabapentin  200 mg Oral TID  . labetalol  100 mg Oral BID  . mouth rinse  15 mL Mouth Rinse BID  . sevelamer carbonate  1.6 g Oral TID WC  . warfarin  10 mg Oral ONCE-1800  . Warfarin - Pharmacist Dosing Inpatient   Does not apply [phone]  Dialysis Orders: TTS - [place] 3h 35min  85.5kg  2/2  R IJ TDC  Hep none Last Labs: 10/11:Hgb 13.0, P 5.7 9/27: TSAT 31%, K 3.9, Ca 8.2, Albumin 3.9  Assessment/Plan: 1. Aortic dissection - Type III 2004. Previous repair in [place]. CT 10/17 changes consistent with descending thoracic aortic dissection with apparent proximal and distal fenestration/slow flow noted within the false lumen as described. Given the history of prior dissection and repair felt to be chronic.  Surgery has seen, feels all chronic. Pain lingers. Repeat CT 10/21 stable/unchanged since 10/17.  SBP goal <140. Medical management, No operative intervention planned.  2. ESRD - TTS, HD yesterday, tolerated well, net UF 2L, post weight 84.5kg.  C/os of cramping post HD. Monitor.  3. Anemia of CKD- Hgb 11. Stable. 4. Secondary hyperparathyroidism - Ca in goal. P elevated. Switched to Renvela powder d/t inability to swallow large pills, ^ to 1600mg . Cont sensipar. 5. HTN/volume - Volume ok. BP stable on labetalol reduced to 100mg  BID- had been d/c as outpatient. Titrate down volume as tolerated.  6. Nutrition - Alb 3.1. Renal/Carb modified diet.  7. A Fib - Coumadin resumed per pharm. Heparin allergy.  8. Thrombocytopenia - stable 9. Elevated troponin - improved. ECHO 10/18 - EF 65-70 10. Neuropathy/chronic pain - per primary 11.  Dispo - ok to d/c from renal standpoint.   Jen Mow, PA-C [facility] Pager: [phone] 01/25/2017,1:25 PM  LOS: 7 days   Pt seen, examined and agree w A/P as above.  Kelly Splinter MD [employer] pager [phone]   01/25/2017, 4:19 PM

## 2017-01-25 NOTE — Progress Notes (Addendum)
PROGRESS NOTE    Corey [name]  RCV:[id number] DOB: 19-Sep-1970 DOA: 01/18/2017 PCP: Patient, No Pcp Per   Brief Narrative:  46 yr old BM  PMHx of ESRD on HD  T/Th/Sat, A-fib S/P Cardioversion 2014, Calciphylaxis of LUE, Aortic Dissection 2004--> lower ext paralysis (wheelchair bound), Ascending Aortic Aneurysm HTN Peripheral Neuropathy,,.     Presents with complaints of chest pain which prevented patient from going to scheduled dialysis. He describes a heavy dull pain, 4-6/10 Moderate in nature that radiates to his back. He states he is compliant with his antihypertensive med. He takes Labetalol at home ( prev on Norvasc) BP ranges 130-140 sys at home. He denies any changes in his vision, fever, chills, sob, nausea, or vomiting. CT scan was performed and showed Acute on Chronic dissection. CTSx does not think the patient is acute or active in this pr Of note pt had an unspecified reaction to coumadin in 2004. Has a h/o being on argatroban in Jan 2018 ? HIT. Currently as an outpatient he is on schedule meds incl. 7.5mg  Coumadin on TTSS and 5mg  Coumadin on MWF.    Subjective: 10/24 A/O 4, negative SOB, positive CP rated 4/10 substernal, negative radiation, negative bilateral lower extremity pain, negative abdominal pain, negative N/V. Positive anxiety.         Assessment & Plan:   Active Problems:   Chest pain   Acute on Chronic Aortic Dissection -medical BP control SBP goal< 140. -per cardiothoracic surgery nonoperative Medical management, maximize BP control. -Labetalol 100 mg BID -Discussed case with Dr. Lanelle Bal cardiothoracic surgery cardiothoracic surgery. Reiterated at this time no surgical intervention required, but cardiothoracic surgery would follow patient. -patient to obtain old records from 15 years ago and bring to follow-up with cardiothoracic surgery especially any radiographic scans -Schedule follow up with in 30 days Dr. Tharon Aquas Trigt cardiothoracic surgery  for Acute on Chronic aortic dissection    Essential HTN -see aortic dissection   Chronic diastolic CHF -strict in and out since admission +27ml  -Daily weight Filed Weights   01/24/17 0758 01/24/17 1158 01/25/17 0530  Weight: 190 lb 7.6 oz (86.4 kg) 186 lb 4.6 oz (84.5 kg) 190 lb 14.7 oz (86.6 kg)  -See aortic dissection -schedule follow-up Dr. Candee Furbish cardiology in 1-2 weeks chronic diastolic CHF, chronic atrial fibrillation   Elevated Troponin -likely secondary to aortic dissection -Echocardiogram: Likely secondary to aortic dissection. No wall motion abnormality on echocardiogram.  Chronic atrial fibrillation -currently in NSR -Rate controlled -Argatroban   HIT? -10/18 HIT antibody pending    ESRD on HD  T/Th/Sat, -HD per nephrology   Thrombocytopenia -Stable continue to monitor closely -Most likely secondary to consumption with acute on chronic aortic dissection. -Negative sign of occult bleed   Peripheral Neuropathy -10/24 increase Neurontin 300 mg TID      DVT prophylaxis: Argatroban  Code Status: full Family Communication: none Disposition Plan: TBD   Consultants:  Nephrology Cardiothoracic surgery PC CM   Procedures/Significant Events:  10/17 - Admit with acute on chronic aortic dissection 10/17 CTA CHEST/ABD/PELVIS:- descending thoracic aortic dissection with apparent proximal and distal fenestrations. Slow flow is noted within the false lumen as described. Given the history of prior dissection and repair this is likely chronic in appearance. -True lumen gives supply to the mesenteric vessels as described. No proximal aortic abnormality is noted. -Changes consistent ESRDwith end-stage renal disease. -Polycystic changes of the right kidney are noted. -Absent left kidney.  10/18 PCXR: Previously reviewed by me.  Mediastinum consistent with aortic dissection. Suggestion of cardiomegaly. Prominent interstitium bilaterally suggestive of pleural  effusions. Low lung volumes. Tunneled right hemodialysis catheter noted. 10/18 Echocardiogram; LVEF=  EF 65-70% without regional wall motion abnormality. -Grade  2 diastolic dysfunction.- RA moderately dilated.- RV moderately dilated with preserved systolic function.     I have personally reviewed and interpreted all radiology studies and my findings are as above.  VENTILATOR SETTINGS: none   Cultures none  Antimicrobials: none   Devices    LINES / TUBES:      Continuous Infusions: . anticoagulant sodium citrate    . argatroban 0.25 mcg/kg/min (01/25/17 0500)     Objective: Vitals:   01/24/17 1930 01/25/17 0135 01/25/17 0525 01/25/17 0530  BP: (!) 148/85 106/71 111/73   Pulse: 87 86 82   Resp:  19 15   Temp:   98.5 F (36.9 C)   TempSrc:   Oral   SpO2: 91% 100% 100%   Weight:    190 lb 14.7 oz (86.6 kg)  Height:        Intake/Output Summary (Last 24 hours) at 01/25/17 0829 Last data filed at 01/25/17 0600  Gross per 24 hour  Intake           636.55 ml  Output             2000 ml  Net         -1363.45 ml   Filed Weights   01/24/17 0758 01/24/17 1158 01/25/17 0530  Weight: 190 lb 7.6 oz (86.4 kg) 186 lb 4.6 oz (84.5 kg) 190 lb 14.7 oz (86.6 kg)    Physical Exam:  General: A/O 4,No acute respiratory distress Neck:  Negative scars, masses, torticollis, lymphadenopathy, JVD Lungs: Clear to auscultation bilaterally without wheezes or crackles Cardiovascular: Regular rhythm and rate without murmur gallop or rub normal S1 and S2 Abdomen: negative abdominal pain, nondistended, positive soft, bowel sounds, no rebound, no ascites, no appreciable mass Extremities: No significant cyanosis, clubbing, or edema bilateral lower extremity hypertrophy, extreme painful to palpation (peripheral neuropathy). Negative pedal edema Psychiatric:  Negative depression, positive anxiety, negative fatigue, negative mania  Central nervous system:  Cranial nerves II through XII  intact, tongue/uvula midline, all extremities muscle strength 5/5, sensation intact throughout, , negative dysarthria, negative expressive aphasia, negative receptive aphasia.       Data Reviewed: Care during the described time interval was provided by me .  I have reviewed this patient's available data, including medical history, events of note, physical examination, and all test results as part of my evaluation.   CBC:  Recent Labs Lab 01/21/17 0549 01/22/17 0257 01/22/17 1324 01/23/17 0236 01/24/17 0232 01/24/17 0701 01/25/17 0333  WBC 5.3 4.9 5.3 4.9 6.5 5.9 6.7  NEUTROABS 3.3 3.0  --   --   --   --   --   HGB 12.2* 12.6* 12.4* 11.7* 12.3* 10.9* 11.0*  HCT 38.4* 39.1 39.4 37.4* 39.4 35.5* 36.3*  MCV 95.5 96.3 97.0 96.6 97.5 97.3 98.9  PLT 118* 109* 129* 117* 137* 126* 235*   Basic Metabolic Panel:  Recent Labs Lab 01/19/17 0158 01/20/17 1000 01/21/17 0549 01/22/17 0257 01/22/17 1324 01/24/17 0701  NA 139 139 134* 136 134* 136  K 3.6 4.1 3.7 3.8 3.9 4.6  CL 103 102 98* 100* 100* 101  CO2 21* 21* 24 23 25  20*  GLUCOSE 87 116* 85 110* 102* 87  BUN 49* 62* 25* 19 21* 42*  CREATININE 13.83* 16.34*  8.84* 6.31* 7.32* 10.75*  CALCIUM 8.5* 8.1* 8.8* 9.0 8.8* 8.9  MG 2.0  --  1.9 1.9 1.9  --   PHOS 6.1* 6.7* 6.1* 5.5*  --  6.9*   GFR: Estimated Creatinine Clearance: 10 mL/min (A) (by C-G formula based on SCr of 10.75 mg/dL (H)). Liver Function Tests:  Recent Labs Lab 01/18/17 1401 01/20/17 1000 01/21/17 0549 01/22/17 0257 01/24/17 0701  AST 18  --   --   --   --   ALT 10*  --   --   --   --   ALKPHOS 495*  --   --   --   --   BILITOT 1.3*  --   --   --   --   PROT 7.2  --   --   --   --   ALBUMIN 3.7 3.0* 2.9* 3.0* 3.1*    Recent Labs Lab 01/18/17 1401  LIPASE 47   No results for input(s): AMMONIA in the last 168 hours. Coagulation Profile:  Recent Labs Lab 01/22/17 0257 01/22/17 1324 01/23/17 0236 01/24/17 0232 01/25/17 0333  INR 1.96 1.86  1.79 1.48 1.50   Cardiac Enzymes: No results for input(s): CKTOTAL, CKMB, CKMBINDEX, TROPONINI in the last 168 hours. BNP (last 3 results) No results for input(s): PROBNP in the last 8760 hours. HbA1C: No results for input(s): HGBA1C in the last 72 hours. CBG: No results for input(s): GLUCAP in the last 168 hours. Lipid Profile: No results for input(s): CHOL, HDL, LDLCALC, TRIG, CHOLHDL, LDLDIRECT in the last 72 hours. Thyroid Function Tests: No results for input(s): TSH, T4TOTAL, FREET4, T3FREE, THYROIDAB in the last 72 hours. Anemia Panel: No results for input(s): VITAMINB12, FOLATE, FERRITIN, TIBC, IRON, RETICCTPCT in the last 72 hours. Urine analysis: No results found for: COLORURINE, APPEARANCEUR, LABSPEC, PHURINE, GLUCOSEU, HGBUR, BILIRUBINUR, KETONESUR, PROTEINUR, UROBILINOGEN, NITRITE, LEUKOCYTESUR Sepsis Labs: @LABRCNTIP (procalcitonin:4,lacticidven:4)  ) Recent Results (from the past 240 hour(s))  MRSA PCR Screening     Status: None   Collection Time: 01/19/17  1:45 AM  Result Value Ref Range Status   MRSA by PCR NEGATIVE NEGATIVE Final    Comment:        The GeneXpert MRSA Assay (FDA approved for NASAL specimens only), is one component of a comprehensive MRSA colonization surveillance program. It is not intended to diagnose MRSA infection nor to guide or monitor treatment for MRSA infections.          Radiology Studies: No results found.      Scheduled Meds: . gabapentin  200 mg Oral TID  . labetalol  100 mg Oral BID  . mouth rinse  15 mL Mouth Rinse BID  . sevelamer carbonate  1.6 g Oral TID WC  . Warfarin - Pharmacist Dosing Inpatient   Does not apply q1800   Continuous Infusions: . anticoagulant sodium citrate    . argatroban 0.25 mcg/kg/min (01/25/17 0500)     LOS: 7 days    Time spent: 40 minutes    WOODS, Geraldo Docker, MD [facility] Pager [phone]   If 7PM-7AM, please contact night-coverage [email or web address] Password  [email or web address] 01/25/2017, 8:29 AM

## 2017-01-25 NOTE — Progress Notes (Addendum)
ANTICOAGULATION CONSULT NOTE - [place] for warfarin Indication: atrial fibrillation (history of HIT)  Allergies  Allergen Reactions  . Heparin Other (See Comments)    UNSPECIFIED REACTION :  On Coumadin since 2004 ? ? HIT ? ?  HIT panel negative 01/19/17   Vital Signs: Temp: 98.5 F (36.9 C) (10/24 0525) Temp Source: Oral (10/24 0525) BP: 111/73 (10/24 0525) Pulse Rate: 82 (10/24 0525)  Labs:  Recent Labs  01/22/17 1324 01/23/17 0236 01/24/17 0232 01/24/17 0701 01/25/17 0333  HGB 12.4* 11.7* 12.3* 10.9* 11.0*  HCT 39.4 37.4* 39.4 35.5* 36.3*  PLT 129* 117* 137* 126* 125*  APTT  --  63* 56*  --  57*  LABPROT 21.3* 20.7* 17.8*  --  18.0*  INR 1.86 1.79 1.48  --  1.50  CREATININE 7.32*  --   --  10.75*  --    Estimated Creatinine Clearance: 10 mL/min (A) (by C-G formula based on SCr of 10.75 mg/dL (H)).  Medications: Argatroban @ 0.68mcg/kg/min  Assessment: 46 yo male with history of ESRD on HD admitted with chest pain. On warfarin PTA for history of afib with last dose on 10/16. Pharmacy consulted to dose argatroban (when INR <2) while warfarin on hold d/t history of HIT. HIT panel NEGATIVE.  Pharmacy consulted to resume warfarin. Once INR > 4, will need to stop argatroban and check INR 4-6 hr later and ensure INR >2.   INR subtherapeutic: 1.50 aPTT remains therapeutic : 57 seconds CBC stable, No bleeding documented  PTA warfarin dose: 5mg  MonFri and 7.5 mg all other days.   Goal of Therapy:  INR 2-3 aPTT range of 50-90 seconds Monitor platelets by anticoagulation protocol: Yes   Plan:  - Warfarin 10mg  PO tonight - Continue Argatroban @ 0.35mcg/kg/min until INR therapeutic - Daily INR, aPTT, and CBC - Monitor for s/sx of bleeding   [name], PharmD Clinical Pharmacist 01/25/2017 10:22 AM

## 2017-01-26 DIAGNOSIS — D696 Thrombocytopenia, unspecified: Secondary | ICD-10-CM

## 2017-01-26 DIAGNOSIS — N186 End stage renal disease: Secondary | ICD-10-CM

## 2017-01-26 DIAGNOSIS — Z992 Dependence on renal dialysis: Secondary | ICD-10-CM

## 2017-01-26 DIAGNOSIS — G629 Polyneuropathy, unspecified: Secondary | ICD-10-CM

## 2017-01-26 DIAGNOSIS — I5032 Chronic diastolic (congestive) heart failure: Secondary | ICD-10-CM

## 2017-01-26 DIAGNOSIS — I7101 Dissection of thoracic aorta: Secondary | ICD-10-CM

## 2017-01-26 DIAGNOSIS — I10 Essential (primary) hypertension: Secondary | ICD-10-CM

## 2017-01-26 DIAGNOSIS — I482 Chronic atrial fibrillation, unspecified: Secondary | ICD-10-CM

## 2017-01-26 DIAGNOSIS — I71019 Dissection of thoracic aorta, unspecified: Secondary | ICD-10-CM

## 2017-01-26 LAB — RENAL FUNCTION PANEL
Albumin: 3 g/dL — ABNORMAL LOW (ref 3.5–5.0)
Anion gap: 13 (ref 5–15)
BUN: 35 mg/dL — ABNORMAL HIGH (ref 6–20)
CO2: 22 mmol/L (ref 22–32)
Calcium: 7.9 mg/dL — ABNORMAL LOW (ref 8.9–10.3)
Chloride: 103 mmol/L (ref 101–111)
Creatinine, Ser: 8.88 mg/dL — ABNORMAL HIGH (ref 0.61–1.24)
GFR calc Af Amer: 7 mL/min — ABNORMAL LOW (ref >60)
GFR calc non Af Amer: 6 mL/min — ABNORMAL LOW (ref >60)
Glucose, Bld: 90 mg/dL (ref 65–99)
Phosphorus: 4.7 mg/dL — ABNORMAL HIGH (ref 2.5–4.6)
Potassium: 4.1 mmol/L (ref 3.5–5.1)
Sodium: 138 mmol/L (ref 135–145)

## 2017-01-26 LAB — CBC
HCT: 37.2 % — ABNORMAL LOW (ref 39.0–52.0)
Hemoglobin: 11.6 g/dL — ABNORMAL LOW (ref 13.0–17.0)
MCH: 30.6 pg (ref 26.0–34.0)
MCHC: 31.2 g/dL (ref 30.0–36.0)
MCV: 98.2 fL (ref 78.0–100.0)
PLATELETS: 127 10*3/uL — AB (ref 150–400)
RBC: 3.79 MIL/uL — AB (ref 4.22–5.81)
RDW: 16.4 % — AB (ref 11.5–15.5)
WBC: 6.2 10*3/uL (ref 4.0–10.5)

## 2017-01-26 LAB — PROTIME-INR
INR: 1.99
Prothrombin Time: 22.4 seconds — ABNORMAL HIGH (ref 11.4–15.2)

## 2017-01-26 LAB — APTT: aPTT: 62 seconds — ABNORMAL HIGH (ref 24–36)

## 2017-01-26 MED ORDER — WARFARIN SODIUM 7.5 MG PO TABS: 7.5000 mg | ORAL_TABLET | Freq: Every day | ORAL | 0 refills | Status: DC

## 2017-01-26 MED ORDER — ENOXAPARIN SODIUM 80 MG/0.8ML ~~LOC~~ SOLN: 80.0000 mg | SUBCUTANEOUS | 0 refills | Status: DC

## 2017-01-26 MED ORDER — SODIUM CHLORIDE 0.9 % IV SOLN: 100.0000 mL | INTRAVENOUS | Status: DC | PRN

## 2017-01-26 MED ORDER — ENOXAPARIN SODIUM 80 MG/0.8ML ~~LOC~~ SOLN
80.0000 mg | SUBCUTANEOUS | Status: DC
  Administered 2017-01-26: 80 mg via SUBCUTANEOUS
  Filled 2017-01-26: qty 0.8

## 2017-01-26 MED ORDER — SODIUM CHLORIDE 0.9 % IV SOLN
100.0000 mL | INTRAVENOUS | Status: DC | PRN
Start: 2017-01-26 — End: 2017-01-26

## 2017-01-26 MED ORDER — ALTEPLASE 2 MG IJ SOLR: 2.0000 mg | Freq: Once | INTRAMUSCULAR | Status: DC | PRN

## 2017-01-26 MED ORDER — WARFARIN SODIUM 10 MG PO TABS
10.0000 mg | ORAL_TABLET | Freq: Once | ORAL | Status: AC
  Administered 2017-01-26: 10 mg via ORAL
  Filled 2017-01-26: qty 1

## 2017-01-26 MED ORDER — LABETALOL HCL 100 MG PO TABS: 100.0000 mg | ORAL_TABLET | Freq: Two times a day (BID) | ORAL | 0 refills | Status: DC

## 2017-01-26 MED ORDER — CINACALCET HCL 30 MG PO TABS: 60.0000 mg | ORAL_TABLET | Freq: Every day | ORAL | 0 refills | Status: DC

## 2017-01-26 MED ORDER — SEVELAMER CARBONATE 0.8 G PO PACK: 1.6000 g | PACK | Freq: Three times a day (TID) | ORAL | 0 refills | Status: DC

## 2017-01-26 MED ORDER — GABAPENTIN 300 MG PO CAPS: 300.0000 mg | ORAL_CAPSULE | Freq: Three times a day (TID) | ORAL | 0 refills | Status: DC

## 2017-01-26 NOTE — Progress Notes (Signed)
[facility] Progress Note   Subjective:   Patient resting on HD, stood to weigh this am, up 2.5 kg  Objective Vitals:   01/26/17 0405 01/26/17 0857 01/26/17 0905 01/26/17 0930  BP: 100/68 106/67 111/71 110/66  Pulse: 72 82 80 82  Resp: 14 16 18 18   Temp: 98.5 F (36.9 C) 97.9 F (36.6 C)    TempSrc: Oral Oral    SpO2: 94% 100% 100% 100%  Weight: 88 kg (194 lb 0.1 oz) 87.7 kg (193 lb 5.5 oz)    Height:       Physical Exam General:NAD, WDWN Heart:RRR, no murmur rub or gallop Lungs:CTAB Abdomen:NT, +BS, soft Extremities:trace b/l edema, +tenderness Dialysis Access: R [facility]   Filed Weights   01/25/17 0530 01/26/17 0405 01/26/17 0857  Weight: 86.6 kg (190 lb 14.7 oz) 88 kg (194 lb 0.1 oz) 87.7 kg (193 lb 5.5 oz)    Intake/Output Summary (Last 24 hours) at 01/26/17 1115 Last data filed at 01/26/17 0444  Gross per 24 hour  Intake          1313.55 ml  Output                0 ml  Net          1313.55 ml    Additional Objective Labs: Basic Metabolic Panel:  Recent Labs Lab 01/21/17 0549 01/22/17 0257 01/22/17 1324 01/24/17 0701  NA 134* 136 134* 136  K 3.7 3.8 3.9 4.6  CL 98* 100* 100* 101  CO2 24 23 25  20*  GLUCOSE 85 110* 102* 87  BUN 25* 19 21* 42*  CREATININE 8.84* 6.31* 7.32* 10.75*  CALCIUM 8.8* 9.0 8.8* 8.9  PHOS 6.1* 5.5*  --  6.9*   Liver Function Tests:  Recent Labs Lab 01/21/17 0549 01/22/17 0257 01/24/17 0701  ALBUMIN 2.9* 3.0* 3.1*   No results for input(s): LIPASE, AMYLASE in the last 168 hours. CBC:  Recent Labs Lab 01/21/17 0549 01/22/17 0257  01/23/17 0236 01/24/17 0232 01/24/17 0701 01/25/17 0333 01/26/17 0300  WBC 5.3 4.9  < > 4.9 6.5 5.9 6.7 6.2  NEUTROABS 3.3 3.0  --   --   --   --   --   --   HGB 12.2* 12.6*  < > 11.7* 12.3* 10.9* 11.0* 11.6*  HCT 38.4* 39.1  < > 37.4* 39.4 35.5* 36.3* 37.2*  MCV 95.5 96.3  < > 96.6 97.5 97.3 98.9 98.2  PLT 118* 109*  < > 117* 137* 126* 125* 127*  < > = values in this  interval not displayed.  Lab Results  Component Value Date   INR 1.99 01/26/2017   INR 1.50 01/25/2017   INR 1.48 01/24/2017   Medications: . sodium chloride    . sodium chloride    . anticoagulant sodium citrate    . argatroban 0.25 mcg/kg/min (01/26/17 0444)   . cinacalcet  60 mg Oral Q supper  . gabapentin  300 mg Oral TID  . labetalol  100 mg Oral BID  . mouth rinse  15 mL Mouth Rinse BID  . sevelamer carbonate  1.6 g Oral TID WC  . warfarin  10 mg Oral ONCE-1800  . Warfarin - Pharmacist Dosing Inpatient   Does not apply q1800    Dialysis Orders: TTS - [place] 3h 68min  85.5kg  2/2  R IJ TDC  Hep none Last Labs: 10/11:Hgb 13.0, P 5.7 9/27: TSAT 31%, K 3.9, Ca 8.2, Albumin 3.9  Assessment: 1. Aortic dissection - Type III 2004. Previous repair in [place]. CT 10/17 changes consistent with descending thoracic aortic dissection with apparent proximal and distal fenestration/slow flow noted within the false lumen as described. Repeat CT 10/21 stable/unchanged since 10/17.  SBP goal <140. Medical management, BP control a primary issue, very good BP's now. No operative intervention planned. Patient will f/u with TCTS in OP setting for this.  2. ESRD - HD TTS. 3. Anemia of CKD- Hgb 11. Stable. 4. Secondary hyperparathyroidism - Ca in goal. P elevated. Switched to Renvela powder d/t inability to swallow large pills, ^ to 1600mg . Cont sensipar. 5. HTN/volume - Volume ok. BPs now on lower side, taking only labetalol 100mg  BID. Weighed on stand scale today, up 2.2kg, UF same as tol today on HD.  6. Nutrition - Alb 3.1. Renal/Carb modified diet.  7. A Fib - Coumadin resumed and getting IV argatroban d/t hep allergy/ per primary. Awaiting Rx INR.  8. Thrombocytopenia - stable 9. Elevated troponin - improved. ECHO 10/18 - EF 65-70 10. Neuropathy/chronic pain - per primary  Plan - HD today, UF to dry wt  Kelly Splinter MD [facility] pager  [phone]   01/26/2017, 11:15 AM

## 2017-01-26 NOTE — Care Management Important Message (Signed)
Important Message  Patient Details  Name: Corey [name] MRN: [id number] Date of Birth: [birth year]   Medicare Important Message Given:  Yes    Irvine Glorioso Abena 01/26/2017, 9:54 AM

## 2017-01-26 NOTE — Discharge Summary (Signed)
Physician Discharge Summary  Corey [name] TIR:[id number] DOB: September 20, 1970 DOA: 01/18/2017  PCP: Patient, No Pcp Per  Admit date: 01/18/2017 Discharge date: 01/26/2017  Time spent:35 minutes  Recommendations for Outpatient Follow-up:  Acute on Chronic Aortic Dissection -medical BP control SBP goal< 140. -per cardiothoracic surgery nonoperative Medical management, maximize BP control. -Labetalol 100 mg BID -Discussed case with Dr. Lanelle Bal cardiothoracic surgery cardiothoracic surgery. Reiterated at this time no surgical intervention required, but cardiothoracic surgery would follow patient. -patient to obtain old records from 15 years ago and bring to follow-up with cardiothoracic surgery especially any radiographic scans -Schedule follow up within 30 days Dr. Tharon Aquas Trigt cardiothoracic surgery for Acute on Chronic aortic dissection   Essential HTN -see aortic dissection   Chronic diastolic CHF -strict in and out since admission +34ml  -Daily weight      Filed Weights    01/24/17 0758 01/24/17 1158 01/25/17 0530  Weight: 190 lb 7.6 oz (86.4 kg) 186 lb 4.6 oz (84.5 kg) 190 lb 14.7 oz (86.6 kg)  -See aortic dissection -schedule follow-up Dr. Candee Furbish cardiology in 1-2 weeks chronic diastolic CHF, chronic atrial fibrillation   Elevated Troponin -likely secondary to aortic dissection -Echocardiogram: Likely secondary to aortic dissection. No wall motion abnormality on echocardiogram.  Chronic atrial fibrillation -currently in NSR -Rate controlled -Lovenox until therapeutic on Coumadin -Coumadin 7.5 mg daily until Coumadin clinic makes adjustment. Follow-up with Coumadin clinic on Monday 29 October -INR to be drawn on Saturday 27 October at HD: Results  will be sent to Coumadin clinic for adjustment of medication.   HIT -10/18 HIT antibody egative    ESRD on HD  T/Th/Sat, -HD per nephrology   Thrombocytopenia -Stable continue to monitor closely -Most likely  secondary to consumption with acute on chronic aortic dissection. -Negative sign of occult bleed   Peripheral Neuropathy -Neurontin 300 mg TID       Discharge Diagnoses:  Active Problems:   Chest pain   Discharge Condition: stable  Diet recommendation: renal diet  Filed Weights   01/25/17 0530 01/26/17 0405 01/26/17 0857  Weight: 190 lb 14.7 oz (86.6 kg) 194 lb 0.1 oz (88 kg) 193 lb 5.5 oz (87.7 kg)    History of present illness:  46 yr old BM  PMHx of ESRD on HD  T/Th/Sat, A-fib S/P Cardioversion 2014, Calciphylaxis of LUE, Aortic Dissection 2004--> lower ext paralysis (wheelchair bound), Ascending Aortic Aneurysm HTN Peripheral Neuropathy,,.     Presents with complaints of chest pain which prevented patient from going to scheduled dialysis. He describes a heavy dull pain, 4-6/10 Moderate in nature that radiates to his back. He states he is compliant with his antihypertensive med. He takes Labetalol at home ( prev on Norvasc) BP ranges 130-140 sys at home. He denies any changes in his vision, fever, chills, sob, nausea, or vomiting. CT scan was performed and showed Acute on Chronic dissection. CTSx does not think the patient is acute or active in this pr Of note pt had an unspecified reaction to coumadin in 2004. Has a h/o being on argatroban in Jan 2018 ? HIT. Currently as an outpatient he is on schedule meds incl. 7.5mg  Coumadin on TTSS and 5mg  Coumadin on MWF.  During this hospitalization patient was evaluated for acute on chronic aortic dissection. It was determined that patient's dissection most likely was chronic and stable. Currently to be treated medically Patient's BP was addressed and an SBP goal< 140 was established. In addition patient's chronic diastolic CHF  was addressed, with medication modifications. Patient will have close follow-up with cardiothoracic surgery, cardiology, and nephrology upon discharge. Stable for discharge.    Procedures: 10/17 - Admit with acute  on chronic aortic dissection 10/17 CTA CHEST/ABD/PELVIS:- descending thoracic aortic dissection with apparent proximal and distal fenestrations. Slow flow is noted within the false lumen as described. Given the history of prior dissection and repair this is likely chronic in appearance. -True lumen gives supply to the mesenteric vessels as described. No proximal aortic abnormality is noted. -Changes consistent ESRDwith end-stage renal disease. -Polycystic changes of the right kidney are noted. -Absent left kidney.  10/18 PCXR: Previously reviewed by me. Mediastinum consistent with aortic dissection. Suggestion of cardiomegaly. Prominent interstitium bilaterally suggestive of pleural effusions. Low lung volumes. Tunneled right hemodialysis catheter noted. 10/18 Echocardiogram; LVEF=  EF 65-70% without regional wall motion abnormality. -Grade  2 diastolic dysfunction.- RA moderately dilated.- RV moderately dilated with preserved systolic function.  10/21CT angiogram chest/abdomen/pelvis for dissection:-Stable type B thoracoabdominal aortic dissection compare to 01/18/2017. 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 Absent left kidney either congenital versus previous nephrectomy. -Bibasilar atelectasis      Consultations: Nephrology Cardiothoracic surgery [facility]    Discharge Exam: Vitals:   01/26/17 1030 01/26/17 1100 01/26/17 1130 01/26/17 1200  BP: 110/66 99/66 (!) 87/64 (!) 101/56  Pulse: 82 83 70 77  Resp: 19 17 18 18   Temp:      TempSrc:      SpO2:  100%  100%  Weight:      Height:        General: A/O 4,No acute respiratory distress Neck:  Negative scars, masses, torticollis, lymphadenopathy, JVD Lungs: Clear to auscultation bilaterally without wheezes or crackles Cardiovascular: Regular rhythm and rate without murmur gallop or rub normal S1 and S2 Abdomen: negative abdominal pain, nondistended, positive soft, bowel  sounds, no rebound, no ascites, no appreciable mass   Discharge Instructions   Allergies as of 01/26/2017      Reactions   Heparin Other (See Comments)   UNSPECIFIED REACTION :  On Coumadin since 2004 HIT panel negative 01/19/17      Medication List    STOP taking these medications   RENVELA 800 MG tablet Generic drug:  sevelamer carbonate Replaced by:  sevelamer carbonate 0.8 g Pack packet     TAKE these medications   cinacalcet 30 MG tablet Commonly known as:  SENSIPAR Take 2 tablets (60 mg total) by mouth daily with supper. What changed:  medication strength  how much to take   enoxaparin 80 MG/0.8ML injection Commonly known as:  LOVENOX Inject 0.8 mLs (80 mg total) into the skin daily.   gabapentin 300 MG capsule Commonly known as:  NEURONTIN Take 1 capsule (300 mg total) by mouth 3 (three) times daily. What changed:  medication strength  how much to take   labetalol 100 MG tablet Commonly known as:  NORMODYNE Take 1 tablet (100 mg total) by mouth 2 (two) times daily. What changed:  medication strength  how much to take   sevelamer carbonate 0.8 g Pack packet Commonly known as:  RENVELA Take 1.6 g by mouth 3 (three) times daily with meals. Replaces:  RENVELA 800 MG tablet   warfarin 7.5 MG tablet Commonly known as:  COUMADIN Take 1 tablet (7.5 mg total) by mouth daily. What changed:  medication strength  See the new instructions.      Allergies  Allergen Reactions  .  Heparin Other (See Comments)    UNSPECIFIED REACTION :  On Coumadin since 2004   HIT panel negative 01/19/17   Follow-up Information    [place] Follow up on 02/13/2017.   Why:  1:30 for hospital follow up Contact information: [place] [id number] [phone]       [place]cott Gum, [name], MD. Schedule an appointment as soon as possible for a visit in 30 day(s).   Specialty:  Cardiothoracic  Surgery Why:  Schedule follow up with in 30 days Dr. Tharon Aquas [facility] cardiothoracic surgery for Acute on Chronic aortic dissection Contact information: [place] [address] [phone]        Jerline Pain, MD. Go on 02/15/2017.   Specialty:  Cardiology Why:  10:00am follow-up with [facility] cardiology /chronic diastolic CHF, chronic atrial fibrillation Contact information: [address] [place] [address] [phone]        [facility] [employer]. Schedule an appointment as soon as possible for a visit on 01/30/2017.   Specialty:  Cardiology Why:  schedule follow-up appointment 29 October INR check Contact information: [address] [place] [phone]           The results of significant diagnostics from this hospitalization (including imaging, microbiology, ancillary and laboratory) are listed below for reference.    Significant Diagnostic Studies: Dg Chest 2 View  Result Date: 01/18/2017 CLINICAL DATA:  Chest pain.  Chronic renal failure EXAM: CHEST  2 VIEW COMPARISON:  None. FINDINGS: Central catheter tip is at cavoatrial junction. No pneumothorax. There is atelectatic change in the right base region. There is slight interstitial edema. No airspace consolidation. Heart is upper normal in size with pulmonary vascularity within normal limits. No adenopathy. There is erosive change in both distal clavicles, likely due to chronic renal failure. There is a stent in the right radial region. IMPRESSION: Suspect a degree of volume overload from chronic renal failure. There is atelectatic change in the right base. No airspace consolidation. Central catheter tip at cavoatrial junction. No pneumothorax. No evident adenopathy. Electronically Signed   By: Lowella Grip III M.D.   On: 01/18/2017 14:53   Dg Chest Port 1 View  Result Date: 01/21/2017 CLINICAL DATA:   Respiratory failure EXAM: PORTABLE CHEST 1 VIEW COMPARISON:  01/19/17 FINDINGS: Cardiac shadow is stable. Prominent aorta is again noted consistent with the patient's given clinical history of descending thoracic aortic dissection. Dialysis catheter is again noted and stable. Stenting in the right upper arm is seen. Some improved atelectasis in the right base is noted. The left lung remains clear. No acute bony abnormality is noted. IMPRESSION: Improving atelectasis in the right base. No new focal abnormality is noted. Electronically Signed   By: Inez Catalina M.D.   On: 01/21/2017 06:49   Dg Chest Port 1 View  Result Date: 01/19/2017 CLINICAL DATA:  Chest pain. Respiratory failure. Aortic dissection. EXAM: PORTABLE CHEST 1 VIEW COMPARISON:  CT 01/18/2017.  Chest x-ray 01/18/2017. FINDINGS: Right IJ dual-lumen catheter in stable position with tip in right atrium. Aortic size appears stable. Cardiomegaly. Slight increase in pulmonary interstitial prominence noted suggesting CHF. Progressive atelectasis right lung base. Vascular stent right upper extremity. IMPRESSION: 1.  Right IJ dual-lumen catheter stable position. 2. Aortic size appears stable in this patient with known aortic dissection. 3. Cardiomegaly with increased bilateral pulmonary interstitial prominence consistent with  CHF. 4.  Progressive right base atelectasis. Electronically Signed   By: Marcello Moores  Register   On: 01/19/2017 07:03   Ct Angio Chest/abd/pel For Dissection W And/or W/wo  Result Date: 01/22/2017 CLINICAL DATA:  Thoracoabdominal aortic dissection, persistent chest pain EXAM: CT ANGIOGRAPHY CHEST, ABDOMEN AND PELVIS TECHNIQUE: Multidetector CT imaging through the chest, abdomen and pelvis was performed using the standard protocol during bolus administration of intravenous contrast. Multiplanar reconstructed images and MIPs were obtained and reviewed to evaluate the vascular anatomy. CONTRAST:  100 cc Isovue 370 COMPARISON:  01/18/2017  FINDINGS: CTA CHEST FINDINGS Cardiovascular: There is re- demonstration of a thoracoabdominal aortic dissection extending just beyond the left subclavian artery involving the descending thoracic aorta and extending into the abdominal aorta just inferior to the right renal artery. Findings compatible with a type B dissection. Grossly stable appearance compared 01/18/2017. Measuring at similar levels, the descending thoracic aorta has a maximal diameter of 4.1 cm. The true lumen remains the smaller of the 2. Mesenteric and right renal vasculature originating off of the smaller true lumen. Ascending thoracic aorta and major branch vessels remain uninvolved. Major branch vessels remain patent. No mediastinal hemorrhage or hematoma. Central pulmonary arteries are patent. Normal heart size. Native coronary atherosclerosis noted. No pericardial effusion. Right IJ dialysis catheter tips within the proximal right atrium. Mediastinum/Nodes: No enlarged mediastinal, hilar, or axillary lymph nodes. Thyroid gland, trachea, and esophagus demonstrate no significant findings. Lungs/Pleura: Similar pattern of scattered bibasilar, lingula, right middle lobe atelectasis. No new acute airspace process, sickle collapse, consolidation. No significant edema. No pleural abnormality, pleural effusion, or pneumothorax. Musculoskeletal: No acute osseous finding. Diffuse increased osseous sclerosis compatible with renal osteodystrophy. No compression fracture. Intact sternum. Review of the MIP images confirms the above findings. CTA ABDOMEN AND PELVIS FINDINGS VASCULAR Aorta: Type B dissection extends to just inferior to the right renal artery. Patient appears status post straight tube graft repair of the infrarenal aorta. Stable atherosclerotic change and wall irregularity of the abdominal aortic repair. Stable small focal outpouching of the distal anastomosis. No retroperitoneal hemorrhage or hematoma. Celiac, SMA, and right renal artery  originate off the smaller true lumen. Celiac: Remains patent SMA: Remains patent Renals: Right renal artery remains patent. Patient is either status post left nephrectomy or has a congenitally absent left kidney. IMA: IMA remains patent inferiorly. Inflow: Iliac vasculature is patent but tortuous with mild atherosclerotic change. No occlusive process or inflow disease. Visualize common femoral, proximal profunda femoral, proximal superficial femoral arteries are patent. Veins: Venous imaging not performed. IVC within the infrarenal IVC as before. Review of the MIP images confirms the above findings. NON-VASCULAR Hepatobiliary: No focal liver abnormality is seen. No gallstones, gallbladder wall thickening, or biliary dilatation. Pancreas: Unremarkable. No pancreatic ductal dilatation or surrounding inflammatory changes. Spleen: Normal in size without focal abnormality. Adrenals/Urinary Tract: Adrenal glands appear normal. Left kidney is absent, either related to congenital absence versus prior nephrectomy. Right kidney demonstrates innumerable renal cysts compatible with a chronic cystic disease of dialysis. No hydronephrosis or obstruction. Bladder is collapsed. Stomach/Bowel: Negative bowel obstruction, significant dilatation, ileus, or free air. Normal appendix. No fluid collection or abscess. Colonic diverticulosis, most pronounced in the sigmoid. Lymphatic: No significant adenopathy. Reproductive: No significant or acute finding by CT. Prostate normal size. Seminal vesicles symmetric. Other: No abdominal wall hernia or abnormality. No abdominopelvic ascites. Musculoskeletal: Sclerosis of the osseous structures compatible with renal osteodystrophy. Degenerative changes of the spine. No acute osseous finding. Review of the MIP images confirms the  above findings. IMPRESSION: Stable type B thoracoabdominal aortic dissection compare to 01/18/2017. 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 Absent left kidney either congenital versus previous nephrectomy. Bibasilar atelectasis No other acute intra-abdominal or pelvic finding. Electronically Signed   By: Jerilynn Mages.  Shick M.D.   On: 01/22/2017 15:00   Ct Angio Chest/abd/pel For Dissection W And/or Wo Contrast  Result Date: 01/18/2017 CLINICAL DATA:  Known history of dissection with increased chest pain EXAM: CT ANGIOGRAPHY CHEST, ABDOMEN AND PELVIS TECHNIQUE: Multidetector CT imaging through the chest, abdomen and pelvis was performed using the standard protocol during bolus administration of intravenous contrast. Multiplanar reconstructed images and MIPs were obtained and reviewed to evaluate the vascular anatomy. CONTRAST:  90 mL Isovue 370. COMPARISON:  None. FINDINGS: CTA CHEST FINDINGS Cardiovascular: The ascending aorta is within normal limits without evidence of dissection or aneurysmal dilatation. No valvular replacement is noted. Just beyond the origin of the left subclavian artery there is evidence of a dissection involving the descending thoracic aorta and extending into the proximal abdominal aorta. Significant increased density is noted within the contrast in the false lumen related to poor flow. Some irregularity is noted distally although this is felt to be related to mixing of opacified and non-opacified blood. The thoracic aorta just beyond the intimal defect measures approximately 4.2 cm in greatest dimension. Descending thoracic aorta measures approximately 4.1 cm. Normal tapering distally is noted. The true lumen is diminutive but patent with supply to the celiac axis, superior mesenteric artery and right renal artery. Postsurgical changes are noted just below the right renal artery consistent with the known history of repair. Mediastinum/Nodes: Thoracic inlet is within normal limits. No significant hilar or mediastinal adenopathy is noted. The esophagus as visualized is within normal  limits. Dialysis catheter is noted on the right. Lungs/Pleura: Lungs are well aerated bilaterally. Mild basilar atelectasis is noted in the right middle and right lower lobes as well as in the left lower lobe. Some atelectatic changes are noted in the left lower lobe adjacent to the dilated aorta. Musculoskeletal: Some increased sclerosis is noted within the vertebral bodies consistent with the patient's known history of end-stage renal disease. No acute bony abnormality is noted. Review of the MIP images confirms the above findings. CTA ABDOMEN AND PELVIS FINDINGS VASCULAR Aorta: There changes consistent with a tube graft in the infrarenal abdominal aorta which is widely patent. Small focal outpouching is noted adjacent to the distal anastomotic site which may be related to prior chronic dissection. As previously described the false lumen an true lumen are opacified within the proximal abdominal aorta although the true lumen is diminutive in size. There is apparent fenestration distally just below the origin of the right renal artery. Celiac: The celiac axis is patent arising from the true lumen as described. No focal stenosis is noted. SMA: Widely patent arising from the distal aspect of the true lumen. Renals: Single right renal artery is noted arising from the distal aspect of the true lumen just prior to the distal fenestration. Left renal artery is not visualized and may have been related to prior nephrectomy although may be related to congenital absence of the left kidney. IMA: The inferior mesenteric artery is patent arising just below the infrarenal aortic tube graft. Iliacs: Widely patent without aneurysmal dilatation. Mild atherosclerotic changes are noted. Veins: No definitive venous abnormality is noted. There is an IVC filter identified in the infrarenal IVC. Review of the  MIP images confirms the above findings. NON-VASCULAR Hepatobiliary: The liver and gallbladder are within normal limits. Pancreas:  Unremarkable. No pancreatic ductal dilatation or surrounding inflammatory changes. Spleen: Normal in size without focal abnormality. Adrenals/Urinary Tract: The left kidney is absent. Clinical correlation is recommended as to whether this is congenital or related to prior surgery. Multiple cysts are noted within the residual right kidney consistent with end-stage renal disease. No obstructive changes are noted. The bladder is decompressed. Stomach/Bowel: The appendix is within normal limits. No obstructive or inflammatory changes of the bowel are seen. Lymphatic: No significant lymphadenopathy is noted. Reproductive: Prostate is unremarkable. Other: No abdominal wall hernia or abnormality. No abdominopelvic ascites. Musculoskeletal: Increased sclerosis in the bony structures is noted consistent with patient's known history of end-stage renal disease. Review of the MIP images confirms the above findings. IMPRESSION: Changes consistent with descending thoracic aortic dissection with apparent proximal and distal fenestrations. Slow flow is noted within the false lumen as described. Given the history of prior dissection and repair this is likely chronic in appearance although given the patient's current clinical symptomatology it would be difficult to exclude an acute component. If prior imaging related to the previous surgery could be obtained at comparison could be made. The true lumen gives supply to the mesenteric vessels as described. No proximal aortic abnormality is noted. Changes consistent with end-stage renal disease. Polycystic changes of the right kidney are noted. Absent left kidney. It is uncertain whether this is postsurgical or related to congenital absence. Bilateral atelectatic changes. Critical Value/emergent results were called by telephone at the time of interpretation on 01/18/2017 at 5:35 pm to Dr. Marda Stalker , who verbally acknowledged these results. Electronically Signed   By: Inez Catalina M.D.   On: 01/18/2017 17:41    Microbiology: Recent Results (from the past 240 hour(s))  MRSA PCR Screening     Status: None   Collection Time: 01/19/17  1:45 AM  Result Value Ref Range Status   MRSA by PCR NEGATIVE NEGATIVE Final    Comment:        The GeneXpert MRSA Assay (FDA approved for NASAL specimens only), is one component of a comprehensive MRSA colonization surveillance program. It is not intended to diagnose MRSA infection nor to guide or monitor treatment for MRSA infections.      Labs: Basic Metabolic Panel:  Recent Labs Lab 01/20/17 1000 01/21/17 0549 01/22/17 0257 01/22/17 1324 01/24/17 0701  NA 139 134* 136 134* 136  K 4.1 3.7 3.8 3.9 4.6  CL 102 98* 100* 100* 101  CO2 21* 24 23 25  20*  GLUCOSE 116* 85 110* 102* 87  BUN 62* 25* 19 21* 42*  CREATININE 16.34* 8.84* 6.31* 7.32* 10.75*  CALCIUM 8.1* 8.8* 9.0 8.8* 8.9  MG  --  1.9 1.9 1.9  --   PHOS 6.7* 6.1* 5.5*  --  6.9*   Liver Function Tests:  Recent Labs Lab 01/20/17 1000 01/21/17 0549 01/22/17 0257 01/24/17 0701  ALBUMIN 3.0* 2.9* 3.0* 3.1*   No results for input(s): LIPASE, AMYLASE in the last 168 hours. No results for input(s): AMMONIA in the last 168 hours. CBC:  Recent Labs Lab 01/21/17 0549 01/22/17 0257  01/23/17 0236 01/24/17 0232 01/24/17 0701 01/25/17 0333 01/26/17 0300  WBC 5.3 4.9  < > 4.9 6.5 5.9 6.7 6.2  NEUTROABS 3.3 3.0  --   --   --   --   --   --   HGB 12.2* 12.6*  < >  11.7* 12.3* 10.9* 11.0* 11.6*  HCT 38.4* 39.1  < > 37.4* 39.4 35.5* 36.3* 37.2*  MCV 95.5 96.3  < > 96.6 97.5 97.3 98.9 98.2  PLT 118* 109*  < > 117* 137* 126* 125* 127*  < > = values in this interval not displayed. Cardiac Enzymes: No results for input(s): CKTOTAL, CKMB, CKMBINDEX, TROPONINI in the last 168 hours. BNP: BNP (last 3 results) No results for input(s): BNP in the last 8760 hours.  ProBNP (last 3 results) No results for input(s): PROBNP in the last 8760  hours.  CBG: No results for input(s): GLUCAP in the last 168 hours.     Signed:  Dia Crawford, MD [facility] [phone] pager

## 2017-01-26 NOTE — Discharge Instructions (Signed)
Information on my medicine - Coumadin   (Warfarin)  This medication education was reviewed with me or my healthcare representative as part of my discharge preparation.  The pharmacist that spoke with me during my hospital stay was:  Jodean Lima Urbano Milhouse, RPH  Why was Coumadin prescribed for you? Coumadin was prescribed for you because you have a blood clot or a medical condition that can cause an increased risk of forming blood clots. Blood clots can cause serious health problems by blocking the flow of blood to the heart, lung, or brain. Coumadin can prevent harmful blood clots from forming. As a reminder your indication for Coumadin is:   Stroke Prevention Because Of Atrial Fibrillation  What test will check on my response to Coumadin? While on Coumadin (warfarin) you will need to have an INR test regularly to ensure that your dose is keeping you in the desired range. The INR (international normalized ratio) number is calculated from the result of the laboratory test called prothrombin time (PT).  If an INR APPOINTMENT HAS NOT ALREADY BEEN MADE FOR YOU please schedule an appointment to have this lab work done by your health care provider within 7 days. Your INR goal is usually a number between:  2 to 3 or your provider may give you a more narrow range like 2-2.5.  Ask your health care provider during an office visit what your goal INR is.  What  do you need to  know  About  COUMADIN? Take Coumadin (warfarin) exactly as prescribed by your healthcare provider about the same time each day.  DO NOT stop taking without talking to the doctor who prescribed the medication.  Stopping without other blood clot prevention medication to take the place of Coumadin may increase your risk of developing a new clot or stroke.  Get refills before you run out.  What do you do if you miss a dose? If you miss a dose, take it as soon as you remember on the same day then continue your regularly scheduled regimen the next  day.  Do not take two doses of Coumadin at the same time.  Important Safety Information A possible side effect of Coumadin (Warfarin) is an increased risk of bleeding. You should call your healthcare provider right away if you experience any of the following: ? Bleeding from an injury or your nose that does not stop. ? Unusual colored urine (red or dark brown) or unusual colored stools (red or black). ? Unusual bruising for unknown reasons. ? A serious fall or if you hit your head (even if there is no bleeding).  Some foods or medicines interact with Coumadin (warfarin) and might alter your response to warfarin. To help avoid this: ? Eat a balanced diet, maintaining a consistent amount of Vitamin K. ? Notify your provider about major diet changes you plan to make. ? Avoid alcohol or limit your intake to 1 drink for women and 2 drinks for men per day. (1 drink is 5 oz. wine, 12 oz. beer, or 1.5 oz. liquor.)  Make sure that ANY health care provider who prescribes medication for you knows that you are taking Coumadin (warfarin).  Also make sure the healthcare provider who is monitoring your Coumadin knows when you have started a new medication including herbals and non-prescription products.  Coumadin (Warfarin)  Major Drug Interactions  Increased Warfarin Effect Decreased Warfarin Effect  Alcohol (large quantities) Antibiotics (esp. Septra/Bactrim, Flagyl, Cipro) Amiodarone (Cordarone) Aspirin (ASA) Cimetidine (Tagamet) Megestrol (Megace) NSAIDs (ibuprofen,  naproxen, etc.) °Piroxicam (Feldene) °Propafenone (Rythmol SR) °Propranolol (Inderal) °Isoniazid (INH) °Posaconazole (Noxafil) Barbiturates (Phenobarbital) °Carbamazepine (Tegretol) °Chlordiazepoxide (Librium) °Cholestyramine (Questran) °Griseofulvin °Oral Contraceptives °Rifampin °Sucralfate (Carafate) °Vitamin K  ° °Coumadin® (Warfarin) Major Herbal Interactions  °Increased Warfarin Effect Decreased Warfarin Effect   °Garlic °Ginseng °Ginkgo biloba Coenzyme Q10 °Green tea °St. John’s wort   ° °Coumadin® (Warfarin) FOOD Interactions  °Eat a consistent number of servings per week of foods HIGH in Vitamin K °(1 serving = ½ cup)  °Collards (cooked, or boiled & drained) °Kale (cooked, or boiled & drained) °Mustard greens (cooked, or boiled & drained) °Parsley *serving size only = ¼ cup °Spinach (cooked, or boiled & drained) °Swiss chard (cooked, or boiled & drained) °Turnip greens (cooked, or boiled & drained)  °Eat a consistent number of servings per week of foods MEDIUM-HIGH in Vitamin K °(1 serving = 1 cup)  °Asparagus (cooked, or boiled & drained) °Broccoli (cooked, boiled & drained, or raw & chopped) °Brussel sprouts (cooked, or boiled & drained) *serving size only = ½ cup °Lettuce, raw (green leaf, endive, romaine) °Spinach, raw °Turnip greens, raw & chopped  ° °These websites have more information on Coumadin (warfarin):  www.coumadin.com; °www.ahrq.gov/consumer/coumadin.htm; ° ° °

## 2017-01-26 NOTE — Progress Notes (Addendum)
Addendum:  The provider has reached out to pharmacy and would like to discharge patient today; he has spoken directly with patient regarding the documented heparin allergy and patient does not recall ever having a reaction to the medication. During this hospital admit a HIT panel was collected and was negative. Given the risks and benefits of anticoagulation in this patient, the provider feels comfortable discharging the patient on Lovenox as bridge therapy until his INR is therapeutic. The Argatroban infusion has been stopped (elimination half life ~30-50 minutes) Given patient's renal dysfunction Lovenox will be given at a once daily reduced dose. The patient should be followed closely after discharge for proper INR management.   Plan: Stop Argatroban Wait one hour and give Lovenox 80mg  sub-Q ONCE daily Monitor for s/sx of bleeding F/U with outpatient clinic to have warfarin adjusted  Corey [name], PharmD Clinical Pharmacist 01/26/2017 12:50 PM   __________________________________________________________________________   Corey [name] for warfarin Indication: atrial fibrillation (history of HIT)  Allergies  Allergen Reactions  . Heparin Other (See Comments)    UNSPECIFIED REACTION :  On Coumadin since 2004   HIT panel negative 01/19/17   Vital Signs: Temp: 98.5 F (36.9 C) (10/25 0405) Temp Source: Oral (10/25 0405) BP: 100/68 (10/25 0405) Pulse Rate: 72 (10/25 0405)  Labs:  Recent Labs  01/24/17 0232 01/24/17 0701 01/25/17 0333 01/26/17 0300  HGB 12.3* 10.9* 11.0* 11.6*  HCT 39.4 35.5* 36.3* 37.2*  PLT 137* 126* 125* 127*  APTT 56*  --  57* 62*  LABPROT 17.8*  --  18.0* 22.4*  INR 1.48  --  1.50 1.99  CREATININE  --  10.75*  --   --    Estimated Creatinine Clearance: 10 mL/min (A) (by C-G formula based on SCr of 10.75 mg/dL (H)).  Medications: Argatroban @ 0.56mcg/kg/min  Assessment: 46 yo male with  history of ESRD on HD admitted with chest pain. On warfarin PTA for history of afib with last dose on 10/16. Pharmacy consulted to dose argatroban (when INR <2) while warfarin on hold d/t history of HIT. HIT panel NEGATIVE.  Pharmacy consulted to resume warfarin. Once INR > 4, will need to stop argatroban and check INR 4-6 hr later and ensure INR >2.   INR subtherapeutic: 1.99 aPTT remains therapeutic : 62 seconds CBC stable, No bleeding documented  PTA warfarin dose: 5mg  MonFri and 7.5 mg all other days.   Goal of Therapy:  INR 2-3  aPTT range of 50-90 seconds Monitor platelets by anticoagulation protocol: Yes   Plan:  - Warfarin 10mg  PO tonight - Continue Argatroban @ 0.31mcg/kg/min until INR therapeutic - Daily INR, aPTT, and CBC - Monitor for s/sx of bleeding  Corey [name], PharmD Clinical Pharmacist 01/26/2017 8:20 AM

## 2017-01-27 ENCOUNTER — Ambulatory Visit (INDEPENDENT_AMBULATORY_CARE_PROVIDER_SITE_OTHER): Payer: Medicare Other | Admitting: *Deleted

## 2017-01-27 DIAGNOSIS — I4891 Unspecified atrial fibrillation: Secondary | ICD-10-CM

## 2017-01-27 DIAGNOSIS — Z7901 Long term (current) use of anticoagulants: Secondary | ICD-10-CM

## 2017-01-27 LAB — POCT INR: INR: 2.3

## 2017-01-28 DIAGNOSIS — Z992 Dependence on renal dialysis: Secondary | ICD-10-CM | POA: Diagnosis not present

## 2017-01-28 DIAGNOSIS — N2581 Secondary hyperparathyroidism of renal origin: Secondary | ICD-10-CM | POA: Diagnosis not present

## 2017-01-28 DIAGNOSIS — N186 End stage renal disease: Secondary | ICD-10-CM | POA: Diagnosis not present

## 2017-01-30 ENCOUNTER — Other Ambulatory Visit: Payer: Self-pay | Admitting: *Deleted

## 2017-01-30 DIAGNOSIS — I71019 Dissection of thoracic aorta, unspecified: Secondary | ICD-10-CM

## 2017-01-30 DIAGNOSIS — I7101 Dissection of thoracic aorta: Secondary | ICD-10-CM

## 2017-01-31 DIAGNOSIS — N2581 Secondary hyperparathyroidism of renal origin: Secondary | ICD-10-CM | POA: Diagnosis not present

## 2017-01-31 DIAGNOSIS — N186 End stage renal disease: Secondary | ICD-10-CM | POA: Diagnosis not present

## 2017-01-31 DIAGNOSIS — Z992 Dependence on renal dialysis: Secondary | ICD-10-CM | POA: Diagnosis not present

## 2017-02-01 DIAGNOSIS — N186 End stage renal disease: Secondary | ICD-10-CM | POA: Diagnosis not present

## 2017-02-01 DIAGNOSIS — Z992 Dependence on renal dialysis: Secondary | ICD-10-CM | POA: Diagnosis not present

## 2017-02-01 DIAGNOSIS — I129 Hypertensive chronic kidney disease with stage 1 through stage 4 chronic kidney disease, or unspecified chronic kidney disease: Secondary | ICD-10-CM | POA: Diagnosis not present

## 2017-02-03 ENCOUNTER — Ambulatory Visit (INDEPENDENT_AMBULATORY_CARE_PROVIDER_SITE_OTHER): Payer: Medicare Other | Admitting: *Deleted

## 2017-02-03 DIAGNOSIS — I4891 Unspecified atrial fibrillation: Secondary | ICD-10-CM | POA: Diagnosis not present

## 2017-02-03 DIAGNOSIS — Z7901 Long term (current) use of anticoagulants: Secondary | ICD-10-CM

## 2017-02-03 LAB — POCT INR: INR: 2.9

## 2017-02-04 DIAGNOSIS — D509 Iron deficiency anemia, unspecified: Secondary | ICD-10-CM | POA: Diagnosis not present

## 2017-02-04 DIAGNOSIS — N186 End stage renal disease: Secondary | ICD-10-CM | POA: Diagnosis not present

## 2017-02-04 DIAGNOSIS — D631 Anemia in chronic kidney disease: Secondary | ICD-10-CM | POA: Diagnosis not present

## 2017-02-04 DIAGNOSIS — N2581 Secondary hyperparathyroidism of renal origin: Secondary | ICD-10-CM | POA: Diagnosis not present

## 2017-02-07 ENCOUNTER — Encounter: Payer: Self-pay | Admitting: Vascular Surgery

## 2017-02-07 DIAGNOSIS — N186 End stage renal disease: Secondary | ICD-10-CM | POA: Diagnosis not present

## 2017-02-07 DIAGNOSIS — D509 Iron deficiency anemia, unspecified: Secondary | ICD-10-CM | POA: Diagnosis not present

## 2017-02-07 DIAGNOSIS — D631 Anemia in chronic kidney disease: Secondary | ICD-10-CM | POA: Diagnosis not present

## 2017-02-07 DIAGNOSIS — N2581 Secondary hyperparathyroidism of renal origin: Secondary | ICD-10-CM | POA: Diagnosis not present

## 2017-02-09 DIAGNOSIS — D509 Iron deficiency anemia, unspecified: Secondary | ICD-10-CM | POA: Diagnosis not present

## 2017-02-09 DIAGNOSIS — D631 Anemia in chronic kidney disease: Secondary | ICD-10-CM | POA: Diagnosis not present

## 2017-02-09 DIAGNOSIS — N186 End stage renal disease: Secondary | ICD-10-CM | POA: Diagnosis not present

## 2017-02-09 DIAGNOSIS — N2581 Secondary hyperparathyroidism of renal origin: Secondary | ICD-10-CM | POA: Diagnosis not present

## 2017-02-11 DIAGNOSIS — N186 End stage renal disease: Secondary | ICD-10-CM | POA: Diagnosis not present

## 2017-02-11 DIAGNOSIS — D509 Iron deficiency anemia, unspecified: Secondary | ICD-10-CM | POA: Diagnosis not present

## 2017-02-11 DIAGNOSIS — D631 Anemia in chronic kidney disease: Secondary | ICD-10-CM | POA: Diagnosis not present

## 2017-02-11 DIAGNOSIS — N2581 Secondary hyperparathyroidism of renal origin: Secondary | ICD-10-CM | POA: Diagnosis not present

## 2017-02-13 ENCOUNTER — Ambulatory Visit (INDEPENDENT_AMBULATORY_CARE_PROVIDER_SITE_OTHER): Payer: Medicare Other | Admitting: Physician Assistant

## 2017-02-13 ENCOUNTER — Encounter (INDEPENDENT_AMBULATORY_CARE_PROVIDER_SITE_OTHER): Payer: Self-pay | Admitting: Physician Assistant

## 2017-02-13 VITALS — BP 108/63 | HR 71 | Temp 98.0°F | Resp 18 | Ht 74.0 in | Wt 193.0 lb

## 2017-02-13 DIAGNOSIS — B029 Zoster without complications: Secondary | ICD-10-CM | POA: Diagnosis not present

## 2017-02-13 DIAGNOSIS — Z09 Encounter for follow-up examination after completed treatment for conditions other than malignant neoplasm: Secondary | ICD-10-CM

## 2017-02-13 DIAGNOSIS — I7101 Dissection of thoracic aorta: Secondary | ICD-10-CM

## 2017-02-13 DIAGNOSIS — I71019 Dissection of thoracic aorta, unspecified: Secondary | ICD-10-CM

## 2017-02-13 MED ORDER — AMITRIPTYLINE HCL 10 MG PO TABS: 10.0000 mg | ORAL_TABLET | Freq: Every day | ORAL | 0 refills | Status: DC

## 2017-02-13 NOTE — Patient Instructions (Signed)

## 2017-02-13 NOTE — Progress Notes (Signed)
Subjective:  Patient ID: Corey [name], male    DOB: 1971-03-19  Age: 46 y.o. MRN: [id number]  CC: hospital f/u  HPI Corey [name] is a 46 y.o. male with a medical history of Afib, blindness/low vision, dessection of aorta, ESRD, HA, HTN, paralysis of lower extremities, calciphylaxis of LUEand PNA presents as a new patient on hospital f/u. Admitted to hospital on 01/18/17 to 01/26/17. 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 Complains of same chest pain that led him to the hospital. However, he considers this baseline. Pain 4/10. Has f/u CTA chest/abd/pel on 03/01/17 and Vascular clinic visit in two days.     Says he has been feeling itching on the left upper back, triceps, and left upper chest. Associated with red crusting lesions. Onset approximately a few weeks ago. The ED MD told pt he may have shingles. Does not recall ever having chickenpox.    Outpatient Medications Prior to Visit  Medication Sig Dispense Refill  . cinacalcet (SENSIPAR) 30 MG tablet Take 2 tablets (60 mg total) by mouth daily with supper. 60 tablet 0  . enoxaparin (LOVENOX) 80 MG/0.8ML injection Inject 0.8 mLs (80 mg total) into the skin daily. 7 Syringe 0  . gabapentin (NEURONTIN) 300 MG capsule Take 1 capsule (300 mg total) by mouth 3 (three) times daily. 90 capsule 0  . labetalol (NORMODYNE) 100 MG tablet Take 1 tablet (100 mg total) by mouth 2 (two) times daily. 60 tablet 0  . sevelamer carbonate (RENVELA) 0.8 g PACK packet Take 1.6 g by mouth 3 (three) times daily with meals. 270 each 0  . warfarin (COUMADIN) 7.5 MG tablet Take 1 tablet (7.5 mg total) by mouth daily. 30 tablet 0   No facility-administered medications prior to visit.      ROS Review of Systems  Constitutional: Negative for chills, fever and malaise/fatigue.  Eyes: Negative for blurred vision.  Respiratory: Negative for shortness of breath.   Cardiovascular: Positive for chest pain. Negative for palpitations.   Gastrointestinal: Negative for abdominal pain and nausea.  Genitourinary: Negative for dysuria and hematuria.  Musculoskeletal: Negative for joint pain and myalgias.  Skin: Negative for rash.  Neurological: Negative for tingling and headaches.  Psychiatric/Behavioral: Negative for depression. The patient is not nervous/anxious.     Objective:  BP 108/63 (BP Location: Left Arm, Patient Position: Sitting, Cuff Size: Normal)   Pulse 71   Temp 98 F (36.7 C) (Oral)   Resp 18   Ht 6\' 2"  (1.88 m)   Wt 193 lb (87.5 kg)   SpO2 100%   BMI 24.78 kg/m   BP/Weight 02/13/2017 01/26/2017 1/60/1093  Systolic BP 235 573 98  Diastolic BP 63 66 60  Wt. (Lbs) 193 188.93 192  BMI 24.78 24.26 24.65      Physical Exam  Constitutional: He is oriented to person, place, and time.  Thin, wheelchair bound, NAD, polite  HENT:  Head: Normocephalic and atraumatic.  Eyes: Conjunctivae are normal. No scleral icterus.  Neck: Normal range of motion. Neck supple. No thyromegaly present.  Cardiovascular: Normal rate.  Occasional skipped beat  Pulmonary/Chest: Effort normal and breath sounds normal. No respiratory distress. He has no wheezes. He has no rales.  Abdominal: Soft. There is no tenderness.  Musculoskeletal: He exhibits no edema.  Neurological: He is alert and oriented to person, place, and time. No cranial nerve deficit.  Skin: Skin is warm and dry. No rash noted. No erythema. No pallor.  Multiple nonconvergent postinflammatory hyperpigmented  lesions along dermatomes C5 and C6  Psychiatric: He has a normal mood and affect. His behavior is normal. Thought content normal.  Vitals reviewed.    Assessment & Plan:    1. Herpes zoster without complication - Begin amitriptyline 10 mg qhs x21 days  2. Dissection of thoracic aorta (HCC) - CBC with Differential - Keep upcoming appt with vascular clinic and with radiology for repeat CTA ches/abd/pelvis. CTA on 01/22/17 stable compared to CTA  01/18/17  3. Hospital discharge follow up - Notes reviewed and f/u labs ordered   Meds ordered this encounter  Medications  . amitriptyline (ELAVIL) 10 MG tablet    Sig: Take 1 tablet (10 mg total) at bedtime by mouth.    Dispense:  21 tablet    Refill:  0    Order Specific Question:   Supervising Provider    Answer:   Tresa Garter [[id number]]    Follow-up: 8 weeks  Clent Demark PA

## 2017-02-14 DIAGNOSIS — N2581 Secondary hyperparathyroidism of renal origin: Secondary | ICD-10-CM | POA: Diagnosis not present

## 2017-02-14 DIAGNOSIS — N186 End stage renal disease: Secondary | ICD-10-CM | POA: Diagnosis not present

## 2017-02-14 DIAGNOSIS — D631 Anemia in chronic kidney disease: Secondary | ICD-10-CM | POA: Diagnosis not present

## 2017-02-14 DIAGNOSIS — D509 Iron deficiency anemia, unspecified: Secondary | ICD-10-CM | POA: Diagnosis not present

## 2017-02-14 LAB — CBC WITH DIFFERENTIAL/PLATELET
BASOS: 1 %
Basophils Absolute: 0 10*3/uL (ref 0.0–0.2)
EOS (ABSOLUTE): 0.1 10*3/uL (ref 0.0–0.4)
EOS: 2 %
HEMATOCRIT: 40.7 % (ref 37.5–51.0)
Hemoglobin: 13.8 g/dL (ref 13.0–17.7)
IMMATURE GRANS (ABS): 0 10*3/uL (ref 0.0–0.1)
IMMATURE GRANULOCYTES: 0 %
LYMPHS: 17 %
Lymphocytes Absolute: 1 10*3/uL (ref 0.7–3.1)
MCH: 30.9 pg (ref 26.6–33.0)
MCHC: 33.9 g/dL (ref 31.5–35.7)
MCV: 91 fL (ref 79–97)
MONOCYTES: 6 %
Monocytes Absolute: 0.3 10*3/uL (ref 0.1–0.9)
NEUTROS PCT: 74 %
Neutrophils Absolute: 4.2 10*3/uL (ref 1.4–7.0)
Platelets: 131 10*3/uL — ABNORMAL LOW (ref 150–379)
RBC: 4.47 x10E6/uL (ref 4.14–5.80)
RDW: 17.1 % — AB (ref 12.3–15.4)
WBC: 5.7 10*3/uL (ref 3.4–10.8)

## 2017-02-14 NOTE — Progress Notes (Signed)
Cardiology Office Note    Date:  02/15/2017   ID:  Corey [name], DOB March 24, 1971, MRN [id number]  PCP:  Patient, No Pcp Per  Cardiologist:  Dr. [name]  Chief Complaint: Hospital follow up for aortic dissection   History of Present Illness:   Corey [name] is a 46 y.o. male with prior hx of aprotic dissection in 2004 and had abdominal aortic infrarenal  & caval filter placed, PAF, ESRD on HD, blind except for shapes and hypertension presents for hospital follow up.   Last seen by APP 08/31/16 for palpitations. 30 days monitor showed 98% NSR and 2% AFib with occasional RVR (just above 100s). No pause > 3. Continued medical therapy.   Admitted 10/17-10/25 for chest pain due dialysis. CT showed acute on chronic dissection. Seen by [facility] who recommended medication management and tight BP control. Advised to bring old records during outpatient [facility] visit.   Here today for follow up. The patient continues to have ongoing intermittent chest pain with and without exertion radiating to his back. This has been stable since day of discharge. His chest discomfort might get worse while laying down but no orthopnea, PND of syncope. Mostly wheel chair bound. Use walker for ambulation at home. No tobacco smoking or alcohol use.   Past Medical History:  Diagnosis Date  . A-fib ([place])   . Arthritis    hands and shoulders  . Blindness and low vision    "Stargardt disease"  . Dissection of aorta ([place]) 2004  . Dysrhythmia    A-fib  . ESRD (end stage renal disease) ([place])   . Headache   . History of cardioversion 2014  . Hypertension   . Neuropathy   . Non-healing non-surgical wound 03/2016  . Paralysis ([place])    due to dissection of aorta in 2004, lower extremities  . Pneumonia     Past Surgical History:  Procedure Laterality Date  . AV FISTULA PLACEMENT    . CARDIOVERSION    . REPAIR OF ACUTE ASCENDING THORACIC AORTIC DISSECTION      Current Medications: Prior to Admission medications     Medication Sig Start Date End Date Taking? Authorizing Provider  amitriptyline (ELAVIL) 10 MG tablet Take 1 tablet (10 mg total) at bedtime by mouth. 02/13/17   Clent Demark, PA-C  cinacalcet (SENSIPAR) 30 MG tablet Take 2 tablets (60 mg total) by mouth daily with supper. 01/26/17   Allie Bossier, MD  enoxaparin (LOVENOX) 80 MG/0.8ML injection Inject 0.8 mLs (80 mg total) into the skin daily. 01/26/17   Allie Bossier, MD  gabapentin (NEURONTIN) 300 MG capsule Take 1 capsule (300 mg total) by mouth 3 (three) times daily. 01/26/17   Allie Bossier, MD  labetalol (NORMODYNE) 100 MG tablet Take 1 tablet (100 mg total) by mouth 2 (two) times daily. 01/26/17   Allie Bossier, MD  sevelamer carbonate (RENVELA) 0.8 g PACK packet Take 1.6 g by mouth 3 (three) times daily with meals. 01/26/17   Allie Bossier, MD  warfarin (COUMADIN) 7.5 MG tablet Take 1 tablet (7.5 mg total) by mouth daily. 01/26/17 01/26/18  Allie Bossier, MD    Allergies:   Heparin   Social History   Socioeconomic History  . Marital status: Divorced    Spouse name: Not on file  . Number of children: 2  . Years of education: Not on file  . Highest education level: Not on file  Social Needs  . Financial resource strain: Not  on file  . Food insecurity - worry: Not on file  . Food insecurity - inability: Not on file  . Transportation needs - medical: Not on file  . Transportation needs - non-medical: Not on file  Occupational History  . Occupation: DIABLED  Tobacco Use  . Smoking status: Never Smoker  . Smokeless tobacco: Never Used  Substance and Sexual Activity  . Alcohol use: No  . Drug use: No  . Sexual activity: Not on file  Other Topics Concern  . Not on file  Social History Narrative  . Not on file     Family History:  The patient's family history includes Cancer in his father and mother; Hypertension in his father and mother; Stroke in his brother; Thyroid disease in his sister.   ROS:    Please see the history of present illness.    ROS All other systems reviewed and are negative.   PHYSICAL EXAM:   VS:  BP 108/68   Pulse 63   Ht 6\' 2"  (1.88 m)   Wt 193 lb (87.5 kg)   BMI 24.78 kg/m    GEN: Well nourished, well developed, in no acute distress  HEENT: normal  Neck: no JVD, carotid bruits, or masses Cardiac: RRR; no murmurs, rubs, or gallops,no edema  Respiratory:  clear to auscultation bilaterally, normal work of breathing GI: soft, nontender, nondistended, + BS MS: no deformity or atrophy  Skin: warm and dry, no rash Neuro:  Alert and Oriented x 3, Strength and sensation are intact Psych: euthymic mood, full affect  Wt Readings from Last 3 Encounters:  02/15/17 193 lb (87.5 kg)  02/13/17 193 lb (87.5 kg)  01/26/17 188 lb 15 oz (85.7 kg)      Studies/Labs Reviewed:   EKG:  EKG is not ordered today.    Recent Labs: 08/31/2016: TSH 1.430 01/18/2017: ALT 10 01/22/2017: Magnesium 1.9 01/26/2017: BUN 35; Creatinine, Ser 8.88; Potassium 4.1; Sodium 138 02/13/2017: Hemoglobin 13.8; Platelets 131   Lipid Panel No results found for: CHOL, TRIG, HDL, CHOLHDL, VLDL, LDLCALC, LDLDIRECT  Additional studies/ records that were reviewed today include:   Echocardiogram: 01/19/17 Study Conclusions  - Left ventricle: The cavity size was normal. There was mild   concentric hypertrophy. Systolic function was vigorous. The   estimated ejection fraction was in the range of 65% to 70%. Wall   motion was normal; there were no regional wall motion   abnormalities. Features are consistent with a pseudonormal left   ventricular filling pattern, with concomitant abnormal relaxation   and increased filling pressure (grade 2 diastolic dysfunction).   Doppler parameters are consistent with elevated ventricular   end-diastolic filling pressure. - Aortic root: The aortic root was normal in size. - Ascending aorta: The ascending aorta was normal in size. - Mitral valve:  Calcified annulus. Mildly thickened leaflets .   There was mild regurgitation. - Left atrium: The atrium was mildly dilated. - Right ventricle: The cavity size was moderately dilated. Wall   thickness was normal. Systolic function was normal. - Right atrium: The atrium was moderately dilated. - Pulmonary arteries: Systolic pressure was moderately increased.   PA peak pressure: 55 mm Hg (S). - Inferior vena cava: The vessel was dilated. The respirophasic   diameter changes were blunted (< 50%), consistent with elevated   central venous pressure. - Pericardium, extracardiac: There was no pericardial effusion.   There was a left pleural effusion    ASSESSMENT & PLAN:    1. PAF -  Sinus on exam. Continue coumadin for anticoagulation.   2. Aortic dissection s/p repair in 2004 - Recent CT showed acute on chronic dissection. Recommended medical therapy.  - his symptoms has been stable since discharge. Advised limit exertional activity. Follow up with [facility] as schedule later this month.   3. HTN - Well controlled on Labetalol.   Follow up with Dr. [name] in 4 months. No change in therapy today.  Medication Adjustments/Labs and Tests Ordered: Current medicines are reviewed at length with the patient today.  Concerns regarding medicines are outlined above.  Medication changes, Labs and Tests ordered today are listed in the Patient Instructions below. There are no Patient Instructions on file for this visit.   Jarrett Soho, [place]  02/15/2017 10:45 AM    [facility] [address], [place], [place]  [address] Phone: [phone]; Fax: [phone]

## 2017-02-15 ENCOUNTER — Ambulatory Visit (INDEPENDENT_AMBULATORY_CARE_PROVIDER_SITE_OTHER): Payer: Medicare Other | Admitting: Physician Assistant

## 2017-02-15 ENCOUNTER — Ambulatory Visit (INDEPENDENT_AMBULATORY_CARE_PROVIDER_SITE_OTHER): Payer: Medicare Other | Admitting: *Deleted

## 2017-02-15 VITALS — BP 108/68 | HR 63 | Ht 74.0 in | Wt 193.0 lb

## 2017-02-15 DIAGNOSIS — I4891 Unspecified atrial fibrillation: Secondary | ICD-10-CM | POA: Diagnosis not present

## 2017-02-15 DIAGNOSIS — I480 Paroxysmal atrial fibrillation: Secondary | ICD-10-CM | POA: Diagnosis not present

## 2017-02-15 DIAGNOSIS — Z7901 Long term (current) use of anticoagulants: Secondary | ICD-10-CM

## 2017-02-15 DIAGNOSIS — Z8679 Personal history of other diseases of the circulatory system: Secondary | ICD-10-CM | POA: Diagnosis not present

## 2017-02-15 DIAGNOSIS — I10 Essential (primary) hypertension: Secondary | ICD-10-CM | POA: Diagnosis not present

## 2017-02-15 LAB — POCT INR: INR: 2.8

## 2017-02-15 NOTE — Patient Instructions (Signed)
Continue taking 1.5 tablets daily except 1 tablet on Mondays and Fridays. Recheck INR in 3 weeks. Continue eating dark green leafy veggies 2-3 times a week.

## 2017-02-15 NOTE — Patient Instructions (Addendum)
Medication Instructions:   Your physician recommends that you continue on your current medications as directed. Please refer to the Current Medication list given to you today.   If you need a refill on your cardiac medications before your next appointment, please call your pharmacy.  Labwork: NONE ORDERED  TODAY    Testing/Procedures: NONE ORDERED  TODAY'   Follow-Up: 4 MONTHS WITH DR. [name]   Any Other Special Instructions Will Be Listed Below (If Applicable).

## 2017-02-18 DIAGNOSIS — N2581 Secondary hyperparathyroidism of renal origin: Secondary | ICD-10-CM | POA: Diagnosis not present

## 2017-02-18 DIAGNOSIS — D631 Anemia in chronic kidney disease: Secondary | ICD-10-CM | POA: Diagnosis not present

## 2017-02-18 DIAGNOSIS — N186 End stage renal disease: Secondary | ICD-10-CM | POA: Diagnosis not present

## 2017-02-18 DIAGNOSIS — D509 Iron deficiency anemia, unspecified: Secondary | ICD-10-CM | POA: Diagnosis not present

## 2017-02-22 ENCOUNTER — Encounter: Payer: Self-pay | Admitting: Nephrology

## 2017-02-22 DIAGNOSIS — D631 Anemia in chronic kidney disease: Secondary | ICD-10-CM | POA: Diagnosis not present

## 2017-02-22 DIAGNOSIS — N186 End stage renal disease: Secondary | ICD-10-CM | POA: Diagnosis not present

## 2017-02-22 DIAGNOSIS — N2581 Secondary hyperparathyroidism of renal origin: Secondary | ICD-10-CM | POA: Diagnosis not present

## 2017-02-22 DIAGNOSIS — D509 Iron deficiency anemia, unspecified: Secondary | ICD-10-CM | POA: Diagnosis not present

## 2017-02-25 DIAGNOSIS — D509 Iron deficiency anemia, unspecified: Secondary | ICD-10-CM | POA: Diagnosis not present

## 2017-02-25 DIAGNOSIS — N2581 Secondary hyperparathyroidism of renal origin: Secondary | ICD-10-CM | POA: Diagnosis not present

## 2017-02-25 DIAGNOSIS — D631 Anemia in chronic kidney disease: Secondary | ICD-10-CM | POA: Diagnosis not present

## 2017-02-25 DIAGNOSIS — N186 End stage renal disease: Secondary | ICD-10-CM | POA: Diagnosis not present

## 2017-02-28 DIAGNOSIS — D509 Iron deficiency anemia, unspecified: Secondary | ICD-10-CM | POA: Diagnosis not present

## 2017-02-28 DIAGNOSIS — N2581 Secondary hyperparathyroidism of renal origin: Secondary | ICD-10-CM | POA: Diagnosis not present

## 2017-02-28 DIAGNOSIS — N186 End stage renal disease: Secondary | ICD-10-CM | POA: Diagnosis not present

## 2017-02-28 DIAGNOSIS — D631 Anemia in chronic kidney disease: Secondary | ICD-10-CM | POA: Diagnosis not present

## 2017-03-01 ENCOUNTER — Encounter: Payer: Medicare Other | Admitting: Cardiothoracic Surgery

## 2017-03-01 ENCOUNTER — Inpatient Hospital Stay: Admission: RE | Admit: 2017-03-01 | Payer: Medicare Other | Source: Ambulatory Visit

## 2017-03-01 ENCOUNTER — Ambulatory Visit
Admission: RE | Admit: 2017-03-01 | Discharge: 2017-03-01 | Disposition: A | Discharge status: Home / Self Care | Payer: Medicare Other | Source: Ambulatory Visit | Attending: Cardiothoracic Surgery | Admitting: Cardiothoracic Surgery

## 2017-03-01 DIAGNOSIS — I71019 Dissection of thoracic aorta, unspecified: Secondary | ICD-10-CM

## 2017-03-01 DIAGNOSIS — I700 Atherosclerosis of aorta: Secondary | ICD-10-CM | POA: Diagnosis not present

## 2017-03-01 DIAGNOSIS — I7101 Dissection of thoracic aorta: Secondary | ICD-10-CM

## 2017-03-01 MED ORDER — IOPAMIDOL (ISOVUE-370) INJECTION 76%
75.0000 mL | Freq: Once | INTRAVENOUS | Status: AC | PRN
  Administered 2017-03-01: 75 mL via INTRAVENOUS

## 2017-03-03 DIAGNOSIS — N186 End stage renal disease: Secondary | ICD-10-CM | POA: Diagnosis not present

## 2017-03-03 DIAGNOSIS — Z992 Dependence on renal dialysis: Secondary | ICD-10-CM | POA: Diagnosis not present

## 2017-03-03 DIAGNOSIS — I129 Hypertensive chronic kidney disease with stage 1 through stage 4 chronic kidney disease, or unspecified chronic kidney disease: Secondary | ICD-10-CM | POA: Diagnosis not present

## 2017-03-04 DIAGNOSIS — N2581 Secondary hyperparathyroidism of renal origin: Secondary | ICD-10-CM | POA: Diagnosis not present

## 2017-03-04 DIAGNOSIS — N186 End stage renal disease: Secondary | ICD-10-CM | POA: Diagnosis not present

## 2017-03-04 DIAGNOSIS — I482 Chronic atrial fibrillation: Secondary | ICD-10-CM | POA: Diagnosis not present

## 2017-03-04 DIAGNOSIS — D631 Anemia in chronic kidney disease: Secondary | ICD-10-CM | POA: Diagnosis not present

## 2017-03-04 DIAGNOSIS — D509 Iron deficiency anemia, unspecified: Secondary | ICD-10-CM | POA: Diagnosis not present

## 2017-03-04 DIAGNOSIS — Z992 Dependence on renal dialysis: Secondary | ICD-10-CM | POA: Diagnosis not present

## 2017-03-07 DIAGNOSIS — N186 End stage renal disease: Secondary | ICD-10-CM | POA: Diagnosis not present

## 2017-03-07 DIAGNOSIS — N2581 Secondary hyperparathyroidism of renal origin: Secondary | ICD-10-CM | POA: Diagnosis not present

## 2017-03-07 DIAGNOSIS — D631 Anemia in chronic kidney disease: Secondary | ICD-10-CM | POA: Diagnosis not present

## 2017-03-07 DIAGNOSIS — Z992 Dependence on renal dialysis: Secondary | ICD-10-CM | POA: Diagnosis not present

## 2017-03-07 DIAGNOSIS — D509 Iron deficiency anemia, unspecified: Secondary | ICD-10-CM | POA: Diagnosis not present

## 2017-03-08 ENCOUNTER — Ambulatory Visit (INDEPENDENT_AMBULATORY_CARE_PROVIDER_SITE_OTHER): Payer: Medicare Other | Admitting: *Deleted

## 2017-03-08 DIAGNOSIS — Z5181 Encounter for therapeutic drug level monitoring: Secondary | ICD-10-CM | POA: Diagnosis not present

## 2017-03-08 DIAGNOSIS — I482 Chronic atrial fibrillation, unspecified: Secondary | ICD-10-CM

## 2017-03-08 DIAGNOSIS — I4891 Unspecified atrial fibrillation: Secondary | ICD-10-CM

## 2017-03-08 DIAGNOSIS — Z7901 Long term (current) use of anticoagulants: Secondary | ICD-10-CM

## 2017-03-08 LAB — POCT INR: INR: 2.1

## 2017-03-08 NOTE — Patient Instructions (Signed)
Continue taking 1.5 tablets daily except 1 tablet on Mondays and Fridays. Recheck INR in 4 weeks. Continue eating dark green leafy veggies 2-3 times a week.

## 2017-03-09 DIAGNOSIS — D631 Anemia in chronic kidney disease: Secondary | ICD-10-CM | POA: Diagnosis not present

## 2017-03-09 DIAGNOSIS — N2581 Secondary hyperparathyroidism of renal origin: Secondary | ICD-10-CM | POA: Diagnosis not present

## 2017-03-09 DIAGNOSIS — N186 End stage renal disease: Secondary | ICD-10-CM | POA: Diagnosis not present

## 2017-03-09 DIAGNOSIS — D509 Iron deficiency anemia, unspecified: Secondary | ICD-10-CM | POA: Diagnosis not present

## 2017-03-09 DIAGNOSIS — Z992 Dependence on renal dialysis: Secondary | ICD-10-CM | POA: Diagnosis not present

## 2017-03-10 ENCOUNTER — Telehealth (INDEPENDENT_AMBULATORY_CARE_PROVIDER_SITE_OTHER): Payer: Self-pay

## 2017-03-10 ENCOUNTER — Telehealth: Payer: Self-pay | Admitting: *Deleted

## 2017-03-10 NOTE — Telephone Encounter (Signed)
Patient called the office and was provided with results of no longer anemic platelets low and blood draw at next visit for smear review. Nat Christen, CMA

## 2017-03-10 NOTE — Telephone Encounter (Signed)
Medical Assistant left message on patient's home and cell voicemail. Voicemail states to give a call back to the doctors office with [facility] at [phone].

## 2017-03-10 NOTE — Telephone Encounter (Signed)
-----   Message from Clent Demark, PA-C sent at 02/14/2017  8:36 AM EST ----- Pt no longer anemic. Platelets are still low. I will draw blood at next visit for hematology smear review.

## 2017-03-11 DIAGNOSIS — N2581 Secondary hyperparathyroidism of renal origin: Secondary | ICD-10-CM | POA: Diagnosis not present

## 2017-03-11 DIAGNOSIS — D509 Iron deficiency anemia, unspecified: Secondary | ICD-10-CM | POA: Diagnosis not present

## 2017-03-11 DIAGNOSIS — Z992 Dependence on renal dialysis: Secondary | ICD-10-CM | POA: Diagnosis not present

## 2017-03-11 DIAGNOSIS — D631 Anemia in chronic kidney disease: Secondary | ICD-10-CM | POA: Diagnosis not present

## 2017-03-11 DIAGNOSIS — N186 End stage renal disease: Secondary | ICD-10-CM | POA: Diagnosis not present

## 2017-03-14 DIAGNOSIS — N186 End stage renal disease: Secondary | ICD-10-CM | POA: Diagnosis not present

## 2017-03-14 DIAGNOSIS — Z992 Dependence on renal dialysis: Secondary | ICD-10-CM | POA: Diagnosis not present

## 2017-03-14 DIAGNOSIS — D631 Anemia in chronic kidney disease: Secondary | ICD-10-CM | POA: Diagnosis not present

## 2017-03-14 DIAGNOSIS — D509 Iron deficiency anemia, unspecified: Secondary | ICD-10-CM | POA: Diagnosis not present

## 2017-03-14 DIAGNOSIS — N2581 Secondary hyperparathyroidism of renal origin: Secondary | ICD-10-CM | POA: Diagnosis not present

## 2017-03-18 DIAGNOSIS — N186 End stage renal disease: Secondary | ICD-10-CM | POA: Diagnosis not present

## 2017-03-18 DIAGNOSIS — Z992 Dependence on renal dialysis: Secondary | ICD-10-CM | POA: Diagnosis not present

## 2017-03-18 DIAGNOSIS — N2581 Secondary hyperparathyroidism of renal origin: Secondary | ICD-10-CM | POA: Diagnosis not present

## 2017-03-18 DIAGNOSIS — D631 Anemia in chronic kidney disease: Secondary | ICD-10-CM | POA: Diagnosis not present

## 2017-03-18 DIAGNOSIS — D509 Iron deficiency anemia, unspecified: Secondary | ICD-10-CM | POA: Diagnosis not present

## 2017-03-21 DIAGNOSIS — Z992 Dependence on renal dialysis: Secondary | ICD-10-CM | POA: Diagnosis not present

## 2017-03-21 DIAGNOSIS — N2581 Secondary hyperparathyroidism of renal origin: Secondary | ICD-10-CM | POA: Diagnosis not present

## 2017-03-21 DIAGNOSIS — N186 End stage renal disease: Secondary | ICD-10-CM | POA: Diagnosis not present

## 2017-03-21 DIAGNOSIS — D509 Iron deficiency anemia, unspecified: Secondary | ICD-10-CM | POA: Diagnosis not present

## 2017-03-21 DIAGNOSIS — D631 Anemia in chronic kidney disease: Secondary | ICD-10-CM | POA: Diagnosis not present

## 2017-03-25 DIAGNOSIS — D631 Anemia in chronic kidney disease: Secondary | ICD-10-CM | POA: Diagnosis not present

## 2017-03-25 DIAGNOSIS — N2581 Secondary hyperparathyroidism of renal origin: Secondary | ICD-10-CM | POA: Diagnosis not present

## 2017-03-25 DIAGNOSIS — Z992 Dependence on renal dialysis: Secondary | ICD-10-CM | POA: Diagnosis not present

## 2017-03-25 DIAGNOSIS — N186 End stage renal disease: Secondary | ICD-10-CM | POA: Diagnosis not present

## 2017-03-25 DIAGNOSIS — D509 Iron deficiency anemia, unspecified: Secondary | ICD-10-CM | POA: Diagnosis not present

## 2017-03-30 DIAGNOSIS — N2581 Secondary hyperparathyroidism of renal origin: Secondary | ICD-10-CM | POA: Diagnosis not present

## 2017-03-30 DIAGNOSIS — D631 Anemia in chronic kidney disease: Secondary | ICD-10-CM | POA: Diagnosis not present

## 2017-03-30 DIAGNOSIS — N186 End stage renal disease: Secondary | ICD-10-CM | POA: Diagnosis not present

## 2017-03-30 DIAGNOSIS — Z992 Dependence on renal dialysis: Secondary | ICD-10-CM | POA: Diagnosis not present

## 2017-03-30 DIAGNOSIS — D509 Iron deficiency anemia, unspecified: Secondary | ICD-10-CM | POA: Diagnosis not present

## 2017-04-01 DIAGNOSIS — D509 Iron deficiency anemia, unspecified: Secondary | ICD-10-CM | POA: Diagnosis not present

## 2017-04-01 DIAGNOSIS — Z992 Dependence on renal dialysis: Secondary | ICD-10-CM | POA: Diagnosis not present

## 2017-04-01 DIAGNOSIS — N186 End stage renal disease: Secondary | ICD-10-CM | POA: Diagnosis not present

## 2017-04-01 DIAGNOSIS — D631 Anemia in chronic kidney disease: Secondary | ICD-10-CM | POA: Diagnosis not present

## 2017-04-01 DIAGNOSIS — N2581 Secondary hyperparathyroidism of renal origin: Secondary | ICD-10-CM | POA: Diagnosis not present

## 2017-04-03 DIAGNOSIS — N186 End stage renal disease: Secondary | ICD-10-CM | POA: Diagnosis not present

## 2017-04-03 DIAGNOSIS — Z992 Dependence on renal dialysis: Secondary | ICD-10-CM | POA: Diagnosis not present

## 2017-04-03 DIAGNOSIS — I129 Hypertensive chronic kidney disease with stage 1 through stage 4 chronic kidney disease, or unspecified chronic kidney disease: Secondary | ICD-10-CM | POA: Diagnosis not present

## 2017-04-06 DIAGNOSIS — Z992 Dependence on renal dialysis: Secondary | ICD-10-CM | POA: Diagnosis not present

## 2017-04-06 DIAGNOSIS — N2581 Secondary hyperparathyroidism of renal origin: Secondary | ICD-10-CM | POA: Diagnosis not present

## 2017-04-06 DIAGNOSIS — N186 End stage renal disease: Secondary | ICD-10-CM | POA: Diagnosis not present

## 2017-04-06 DIAGNOSIS — D631 Anemia in chronic kidney disease: Secondary | ICD-10-CM | POA: Diagnosis not present

## 2017-04-06 DIAGNOSIS — D509 Iron deficiency anemia, unspecified: Secondary | ICD-10-CM | POA: Diagnosis not present

## 2017-04-08 DIAGNOSIS — D631 Anemia in chronic kidney disease: Secondary | ICD-10-CM | POA: Diagnosis not present

## 2017-04-08 DIAGNOSIS — D509 Iron deficiency anemia, unspecified: Secondary | ICD-10-CM | POA: Diagnosis not present

## 2017-04-08 DIAGNOSIS — N186 End stage renal disease: Secondary | ICD-10-CM | POA: Diagnosis not present

## 2017-04-08 DIAGNOSIS — Z992 Dependence on renal dialysis: Secondary | ICD-10-CM | POA: Diagnosis not present

## 2017-04-08 DIAGNOSIS — N2581 Secondary hyperparathyroidism of renal origin: Secondary | ICD-10-CM | POA: Diagnosis not present

## 2017-04-10 ENCOUNTER — Ambulatory Visit (INDEPENDENT_AMBULATORY_CARE_PROVIDER_SITE_OTHER): Payer: Medicare Other | Admitting: Physician Assistant

## 2017-04-11 DIAGNOSIS — D631 Anemia in chronic kidney disease: Secondary | ICD-10-CM | POA: Diagnosis not present

## 2017-04-11 DIAGNOSIS — Z992 Dependence on renal dialysis: Secondary | ICD-10-CM | POA: Diagnosis not present

## 2017-04-11 DIAGNOSIS — D509 Iron deficiency anemia, unspecified: Secondary | ICD-10-CM | POA: Diagnosis not present

## 2017-04-11 DIAGNOSIS — N186 End stage renal disease: Secondary | ICD-10-CM | POA: Diagnosis not present

## 2017-04-11 DIAGNOSIS — N2581 Secondary hyperparathyroidism of renal origin: Secondary | ICD-10-CM | POA: Diagnosis not present

## 2017-04-13 DIAGNOSIS — D509 Iron deficiency anemia, unspecified: Secondary | ICD-10-CM | POA: Diagnosis not present

## 2017-04-13 DIAGNOSIS — N2581 Secondary hyperparathyroidism of renal origin: Secondary | ICD-10-CM | POA: Diagnosis not present

## 2017-04-13 DIAGNOSIS — N186 End stage renal disease: Secondary | ICD-10-CM | POA: Diagnosis not present

## 2017-04-13 DIAGNOSIS — D631 Anemia in chronic kidney disease: Secondary | ICD-10-CM | POA: Diagnosis not present

## 2017-04-13 DIAGNOSIS — Z992 Dependence on renal dialysis: Secondary | ICD-10-CM | POA: Diagnosis not present

## 2017-04-15 DIAGNOSIS — D509 Iron deficiency anemia, unspecified: Secondary | ICD-10-CM | POA: Diagnosis not present

## 2017-04-15 DIAGNOSIS — D631 Anemia in chronic kidney disease: Secondary | ICD-10-CM | POA: Diagnosis not present

## 2017-04-15 DIAGNOSIS — N186 End stage renal disease: Secondary | ICD-10-CM | POA: Diagnosis not present

## 2017-04-15 DIAGNOSIS — N2581 Secondary hyperparathyroidism of renal origin: Secondary | ICD-10-CM | POA: Diagnosis not present

## 2017-04-15 DIAGNOSIS — Z992 Dependence on renal dialysis: Secondary | ICD-10-CM | POA: Diagnosis not present

## 2017-04-18 ENCOUNTER — Encounter: Payer: Self-pay | Admitting: Nephrology

## 2017-04-18 DIAGNOSIS — N2581 Secondary hyperparathyroidism of renal origin: Secondary | ICD-10-CM | POA: Diagnosis not present

## 2017-04-18 DIAGNOSIS — D509 Iron deficiency anemia, unspecified: Secondary | ICD-10-CM | POA: Diagnosis not present

## 2017-04-18 DIAGNOSIS — Z992 Dependence on renal dialysis: Secondary | ICD-10-CM | POA: Diagnosis not present

## 2017-04-18 DIAGNOSIS — N186 End stage renal disease: Secondary | ICD-10-CM | POA: Diagnosis not present

## 2017-04-18 DIAGNOSIS — D631 Anemia in chronic kidney disease: Secondary | ICD-10-CM | POA: Diagnosis not present

## 2017-04-19 ENCOUNTER — Ambulatory Visit (INDEPENDENT_AMBULATORY_CARE_PROVIDER_SITE_OTHER): Payer: Medicare Other | Admitting: Physician Assistant

## 2017-04-19 ENCOUNTER — Encounter (INDEPENDENT_AMBULATORY_CARE_PROVIDER_SITE_OTHER): Payer: Self-pay | Admitting: Physician Assistant

## 2017-04-19 ENCOUNTER — Other Ambulatory Visit: Payer: Self-pay

## 2017-04-19 VITALS — BP 123/69 | HR 78 | Temp 97.9°F | Wt 199.4 lb

## 2017-04-19 DIAGNOSIS — G629 Polyneuropathy, unspecified: Secondary | ICD-10-CM

## 2017-04-19 DIAGNOSIS — I7100 Dissection of unspecified site of aorta: Secondary | ICD-10-CM

## 2017-04-19 DIAGNOSIS — I480 Paroxysmal atrial fibrillation: Secondary | ICD-10-CM

## 2017-04-19 MED ORDER — LABETALOL HCL 100 MG PO TABS: 100.0000 mg | ORAL_TABLET | Freq: Two times a day (BID) | ORAL | 0 refills | Status: DC

## 2017-04-19 MED ORDER — LABETALOL HCL 100 MG PO TABS: 100.0000 mg | ORAL_TABLET | Freq: Two times a day (BID) | ORAL | 3 refills | Status: DC

## 2017-04-19 NOTE — Patient Instructions (Signed)

## 2017-04-19 NOTE — Progress Notes (Signed)
Subjective:  Patient ID: Corey [name], male    DOB: 1970-08-08  Age: 47 y.o. MRN: [id number]  CC: f/u dissection  HPI   [name] is a 47 y.o. male with a medical history of Afib, blindness/low vision, dissection of aorta, paroxysmal Afib, ESRD, HA, HTN, paralysis of lower extremities, calciphylaxis of LUE, Stargardt disease, and PNA presents on f/u of dissection of aorta. 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 CTA on 01/22/17 stable compared to CTA 01/18/17. Went to cardiology and was deemed stable since discharge. Advised to limit exertional activity and to f/u with [facility]. HTN well controlled on Labetalol. Pt says he is mostly asymptomatic but has mild chest discomfort "like a dull ache" that may occur once every other week and last 20 seconds at the most. Infrequent palpitations. Last episode "early December". Does have persistent weakness of the legs and back pain  but at baseline. Does not endorse headache, saddle paresthesia, urinary incontinence, fecal incontinence, abdominal pain, f/c/n/v, or rash.    Outpatient Medications Prior to Visit  Medication Sig Dispense Refill  . cinacalcet (SENSIPAR) 30 MG tablet Take 2 tablets (60 mg total) by mouth daily with supper. 60 tablet 0  . gabapentin (NEURONTIN) 300 MG capsule Take 1 capsule (300 mg total) by mouth 3 (three) times daily. 90 capsule 0  . labetalol (NORMODYNE) 100 MG tablet Take 1 tablet (100 mg total) by mouth 2 (two) times daily. 60 tablet 0  . sevelamer carbonate (RENVELA) 0.8 g PACK packet Take 1.6 g by mouth 3 (three) times daily with meals. 270 each 0  . warfarin (COUMADIN) 7.5 MG tablet Take 1 tablet (7.5 mg total) by mouth daily. 30 tablet 0  . amitriptyline (ELAVIL) 10 MG tablet Take 1 tablet (10 mg total) at bedtime by mouth. (Patient not taking: Reported on 04/19/2017) 21 tablet 0  . enoxaparin (LOVENOX) 80 MG/0.8ML injection Inject 0.8 mLs (80 mg total) into the skin daily. (Patient not  taking: Reported on 04/19/2017) 7 Syringe 0   No facility-administered medications prior to visit.      ROS Review of Systems  Constitutional: Negative for chills, fever and malaise/fatigue.  Eyes: Negative for blurred vision.  Respiratory: Negative for shortness of breath.   Cardiovascular: Negative for chest pain and palpitations.  Gastrointestinal: Negative for abdominal pain and nausea.  Genitourinary: Negative for dysuria and hematuria.  Musculoskeletal: Negative for joint pain and myalgias.  Skin: Negative for rash.  Neurological: Negative for tingling and headaches.  Psychiatric/Behavioral: Negative for depression. The patient is not nervous/anxious.     Objective:  BP 123/69 (BP Location: Right Arm, Patient Position: Sitting, Cuff Size: Normal)   Pulse 78   Temp 97.9 F (36.6 C) (Oral)   Wt 199 lb 6.4 oz (90.4 kg)   SpO2 97%   BMI 25.60 kg/m   BP/Weight 04/19/2017 02/15/2017 69/48/5462  Systolic BP 703 500 938  Diastolic BP 69 68 63  Wt. (Lbs) 199.4 193 193  BMI 25.6 24.78 24.78      Physical Exam  Constitutional: He is oriented to person, place, and time.  Well developed, well nourished, NAD, polite, sitting in wheelchair  HENT:  Head: Normocephalic and atraumatic.  Eyes: Conjunctivae are normal. No scleral icterus.  Neck: Normal range of motion. Neck supple. No thyromegaly present.  Cardiovascular: Normal rate, regular rhythm and normal heart sounds.  2+ pitting edema of lower extremity bilaterally  Pulmonary/Chest: Effort normal and breath sounds normal.  Musculoskeletal: He exhibits no  edema.  Neurological: He is alert and oriented to person, place, and time. No cranial nerve deficit.  Moderate TTP of the lower extremity bilaterally.  Skin: Skin is warm and dry. No rash noted. No erythema. No pallor.  Psychiatric: He has a normal mood and affect. His behavior is normal. Thought content normal.  Vitals reviewed.    Assessment & Plan:    1.  Dissection of aorta, unspecified portion of aorta (HCC) - Refill labetalol (NORMODYNE) 100 MG tablet; Take 1 tablet (100 mg total) by mouth 2 (two) times daily.  Dispense: 180 tablet; Refill: 3 - Continue with vascular as directed.   2. Paroxysmal A-fib (HCC) -Refill labetalol (NORMODYNE) 100 MG tablet; Take 1 tablet (100 mg total) by mouth 2 (two) times daily.  Dispense: 180 tablet; Refill: 3 - Continue with [facility]   3. Peripheral polyneuropathy - Stopped gabapentin. Patient does not want to take anymore. Declines alternative.     Meds ordered this encounter  Medications  . DISCONTD: labetalol (NORMODYNE) 100 MG tablet    Sig: Take 1 tablet (100 mg total) by mouth 2 (two) times daily.    Dispense:  60 tablet    Refill:  0    Order Specific Question:   Supervising Provider    Answer:   Tresa Garter [id number]  . labetalol (NORMODYNE) 100 MG tablet    Sig: Take 1 tablet (100 mg total) by mouth 2 (two) times daily.    Dispense:  180 tablet    Refill:  3    Order Specific Question:   Supervising Provider    Answer:   Tresa Garter [id number]    Follow-up: Return in about 6 months (around 10/17/2017) for neuropathy, afib.   Clent Demark PA

## 2017-04-22 DIAGNOSIS — N2581 Secondary hyperparathyroidism of renal origin: Secondary | ICD-10-CM | POA: Diagnosis not present

## 2017-04-22 DIAGNOSIS — D509 Iron deficiency anemia, unspecified: Secondary | ICD-10-CM | POA: Diagnosis not present

## 2017-04-22 DIAGNOSIS — N186 End stage renal disease: Secondary | ICD-10-CM | POA: Diagnosis not present

## 2017-04-22 DIAGNOSIS — Z992 Dependence on renal dialysis: Secondary | ICD-10-CM | POA: Diagnosis not present

## 2017-04-22 DIAGNOSIS — D631 Anemia in chronic kidney disease: Secondary | ICD-10-CM | POA: Diagnosis not present

## 2017-04-25 DIAGNOSIS — N2581 Secondary hyperparathyroidism of renal origin: Secondary | ICD-10-CM | POA: Diagnosis not present

## 2017-04-25 DIAGNOSIS — D631 Anemia in chronic kidney disease: Secondary | ICD-10-CM | POA: Diagnosis not present

## 2017-04-25 DIAGNOSIS — N186 End stage renal disease: Secondary | ICD-10-CM | POA: Diagnosis not present

## 2017-04-25 DIAGNOSIS — Z992 Dependence on renal dialysis: Secondary | ICD-10-CM | POA: Diagnosis not present

## 2017-04-25 DIAGNOSIS — D509 Iron deficiency anemia, unspecified: Secondary | ICD-10-CM | POA: Diagnosis not present

## 2017-04-29 DIAGNOSIS — Z992 Dependence on renal dialysis: Secondary | ICD-10-CM | POA: Diagnosis not present

## 2017-04-29 DIAGNOSIS — I482 Chronic atrial fibrillation: Secondary | ICD-10-CM | POA: Diagnosis not present

## 2017-04-29 DIAGNOSIS — D631 Anemia in chronic kidney disease: Secondary | ICD-10-CM | POA: Diagnosis not present

## 2017-04-29 DIAGNOSIS — D509 Iron deficiency anemia, unspecified: Secondary | ICD-10-CM | POA: Diagnosis not present

## 2017-04-29 DIAGNOSIS — N186 End stage renal disease: Secondary | ICD-10-CM | POA: Diagnosis not present

## 2017-04-29 DIAGNOSIS — N2581 Secondary hyperparathyroidism of renal origin: Secondary | ICD-10-CM | POA: Diagnosis not present

## 2017-05-02 ENCOUNTER — Encounter: Payer: Self-pay | Admitting: Nephrology

## 2017-05-02 DIAGNOSIS — N2581 Secondary hyperparathyroidism of renal origin: Secondary | ICD-10-CM | POA: Diagnosis not present

## 2017-05-02 DIAGNOSIS — Z992 Dependence on renal dialysis: Secondary | ICD-10-CM | POA: Diagnosis not present

## 2017-05-02 DIAGNOSIS — N186 End stage renal disease: Secondary | ICD-10-CM | POA: Diagnosis not present

## 2017-05-02 DIAGNOSIS — D509 Iron deficiency anemia, unspecified: Secondary | ICD-10-CM | POA: Diagnosis not present

## 2017-05-02 DIAGNOSIS — D631 Anemia in chronic kidney disease: Secondary | ICD-10-CM | POA: Diagnosis not present

## 2017-05-04 DIAGNOSIS — N186 End stage renal disease: Secondary | ICD-10-CM | POA: Diagnosis not present

## 2017-05-04 DIAGNOSIS — Z992 Dependence on renal dialysis: Secondary | ICD-10-CM | POA: Diagnosis not present

## 2017-05-04 DIAGNOSIS — I129 Hypertensive chronic kidney disease with stage 1 through stage 4 chronic kidney disease, or unspecified chronic kidney disease: Secondary | ICD-10-CM | POA: Diagnosis not present

## 2017-05-05 DIAGNOSIS — N186 End stage renal disease: Secondary | ICD-10-CM | POA: Diagnosis not present

## 2017-05-05 DIAGNOSIS — Z992 Dependence on renal dialysis: Secondary | ICD-10-CM | POA: Diagnosis not present

## 2017-05-05 DIAGNOSIS — I129 Hypertensive chronic kidney disease with stage 1 through stage 4 chronic kidney disease, or unspecified chronic kidney disease: Secondary | ICD-10-CM | POA: Diagnosis not present

## 2017-05-06 DIAGNOSIS — N186 End stage renal disease: Secondary | ICD-10-CM | POA: Diagnosis not present

## 2017-05-06 DIAGNOSIS — D631 Anemia in chronic kidney disease: Secondary | ICD-10-CM | POA: Diagnosis not present

## 2017-05-06 DIAGNOSIS — N2581 Secondary hyperparathyroidism of renal origin: Secondary | ICD-10-CM | POA: Diagnosis not present

## 2017-05-06 DIAGNOSIS — D509 Iron deficiency anemia, unspecified: Secondary | ICD-10-CM | POA: Diagnosis not present

## 2017-05-06 DIAGNOSIS — Z992 Dependence on renal dialysis: Secondary | ICD-10-CM | POA: Diagnosis not present

## 2017-05-11 DIAGNOSIS — N2581 Secondary hyperparathyroidism of renal origin: Secondary | ICD-10-CM | POA: Diagnosis not present

## 2017-05-11 DIAGNOSIS — Z992 Dependence on renal dialysis: Secondary | ICD-10-CM | POA: Diagnosis not present

## 2017-05-11 DIAGNOSIS — D509 Iron deficiency anemia, unspecified: Secondary | ICD-10-CM | POA: Diagnosis not present

## 2017-05-11 DIAGNOSIS — D631 Anemia in chronic kidney disease: Secondary | ICD-10-CM | POA: Diagnosis not present

## 2017-05-11 DIAGNOSIS — N186 End stage renal disease: Secondary | ICD-10-CM | POA: Diagnosis not present

## 2017-05-13 DIAGNOSIS — D631 Anemia in chronic kidney disease: Secondary | ICD-10-CM | POA: Diagnosis not present

## 2017-05-13 DIAGNOSIS — D509 Iron deficiency anemia, unspecified: Secondary | ICD-10-CM | POA: Diagnosis not present

## 2017-05-13 DIAGNOSIS — N186 End stage renal disease: Secondary | ICD-10-CM | POA: Diagnosis not present

## 2017-05-13 DIAGNOSIS — N2581 Secondary hyperparathyroidism of renal origin: Secondary | ICD-10-CM | POA: Diagnosis not present

## 2017-05-13 DIAGNOSIS — Z992 Dependence on renal dialysis: Secondary | ICD-10-CM | POA: Diagnosis not present

## 2017-05-16 DIAGNOSIS — Z992 Dependence on renal dialysis: Secondary | ICD-10-CM | POA: Diagnosis not present

## 2017-05-16 DIAGNOSIS — N2581 Secondary hyperparathyroidism of renal origin: Secondary | ICD-10-CM | POA: Diagnosis not present

## 2017-05-16 DIAGNOSIS — D631 Anemia in chronic kidney disease: Secondary | ICD-10-CM | POA: Diagnosis not present

## 2017-05-16 DIAGNOSIS — D509 Iron deficiency anemia, unspecified: Secondary | ICD-10-CM | POA: Diagnosis not present

## 2017-05-16 DIAGNOSIS — N186 End stage renal disease: Secondary | ICD-10-CM | POA: Diagnosis not present

## 2017-05-18 DIAGNOSIS — D631 Anemia in chronic kidney disease: Secondary | ICD-10-CM | POA: Diagnosis not present

## 2017-05-18 DIAGNOSIS — N186 End stage renal disease: Secondary | ICD-10-CM | POA: Diagnosis not present

## 2017-05-18 DIAGNOSIS — N2581 Secondary hyperparathyroidism of renal origin: Secondary | ICD-10-CM | POA: Diagnosis not present

## 2017-05-18 DIAGNOSIS — D509 Iron deficiency anemia, unspecified: Secondary | ICD-10-CM | POA: Diagnosis not present

## 2017-05-18 DIAGNOSIS — Z992 Dependence on renal dialysis: Secondary | ICD-10-CM | POA: Diagnosis not present

## 2017-05-20 DIAGNOSIS — N186 End stage renal disease: Secondary | ICD-10-CM | POA: Diagnosis not present

## 2017-05-20 DIAGNOSIS — N2581 Secondary hyperparathyroidism of renal origin: Secondary | ICD-10-CM | POA: Diagnosis not present

## 2017-05-20 DIAGNOSIS — D631 Anemia in chronic kidney disease: Secondary | ICD-10-CM | POA: Diagnosis not present

## 2017-05-20 DIAGNOSIS — Z992 Dependence on renal dialysis: Secondary | ICD-10-CM | POA: Diagnosis not present

## 2017-05-20 DIAGNOSIS — D509 Iron deficiency anemia, unspecified: Secondary | ICD-10-CM | POA: Diagnosis not present

## 2017-05-25 DIAGNOSIS — N186 End stage renal disease: Secondary | ICD-10-CM | POA: Diagnosis not present

## 2017-05-25 DIAGNOSIS — N2581 Secondary hyperparathyroidism of renal origin: Secondary | ICD-10-CM | POA: Diagnosis not present

## 2017-05-25 DIAGNOSIS — D631 Anemia in chronic kidney disease: Secondary | ICD-10-CM | POA: Diagnosis not present

## 2017-05-25 DIAGNOSIS — D509 Iron deficiency anemia, unspecified: Secondary | ICD-10-CM | POA: Diagnosis not present

## 2017-05-25 DIAGNOSIS — Z992 Dependence on renal dialysis: Secondary | ICD-10-CM | POA: Diagnosis not present

## 2017-05-27 DIAGNOSIS — N186 End stage renal disease: Secondary | ICD-10-CM | POA: Diagnosis not present

## 2017-05-27 DIAGNOSIS — Z992 Dependence on renal dialysis: Secondary | ICD-10-CM | POA: Diagnosis not present

## 2017-05-27 DIAGNOSIS — D631 Anemia in chronic kidney disease: Secondary | ICD-10-CM | POA: Diagnosis not present

## 2017-05-27 DIAGNOSIS — D509 Iron deficiency anemia, unspecified: Secondary | ICD-10-CM | POA: Diagnosis not present

## 2017-05-27 DIAGNOSIS — N2581 Secondary hyperparathyroidism of renal origin: Secondary | ICD-10-CM | POA: Diagnosis not present

## 2017-05-30 DIAGNOSIS — I482 Chronic atrial fibrillation: Secondary | ICD-10-CM | POA: Diagnosis not present

## 2017-05-30 DIAGNOSIS — D631 Anemia in chronic kidney disease: Secondary | ICD-10-CM | POA: Diagnosis not present

## 2017-05-30 DIAGNOSIS — Z992 Dependence on renal dialysis: Secondary | ICD-10-CM | POA: Diagnosis not present

## 2017-05-30 DIAGNOSIS — D509 Iron deficiency anemia, unspecified: Secondary | ICD-10-CM | POA: Diagnosis not present

## 2017-05-30 DIAGNOSIS — N186 End stage renal disease: Secondary | ICD-10-CM | POA: Diagnosis not present

## 2017-05-30 DIAGNOSIS — N2581 Secondary hyperparathyroidism of renal origin: Secondary | ICD-10-CM | POA: Diagnosis not present

## 2017-06-01 DIAGNOSIS — D509 Iron deficiency anemia, unspecified: Secondary | ICD-10-CM | POA: Diagnosis not present

## 2017-06-01 DIAGNOSIS — N2581 Secondary hyperparathyroidism of renal origin: Secondary | ICD-10-CM | POA: Diagnosis not present

## 2017-06-01 DIAGNOSIS — N186 End stage renal disease: Secondary | ICD-10-CM | POA: Diagnosis not present

## 2017-06-01 DIAGNOSIS — Z992 Dependence on renal dialysis: Secondary | ICD-10-CM | POA: Diagnosis not present

## 2017-06-01 DIAGNOSIS — D631 Anemia in chronic kidney disease: Secondary | ICD-10-CM | POA: Diagnosis not present

## 2017-06-02 DIAGNOSIS — Z992 Dependence on renal dialysis: Secondary | ICD-10-CM | POA: Diagnosis not present

## 2017-06-02 DIAGNOSIS — N186 End stage renal disease: Secondary | ICD-10-CM | POA: Diagnosis not present

## 2017-06-02 DIAGNOSIS — I129 Hypertensive chronic kidney disease with stage 1 through stage 4 chronic kidney disease, or unspecified chronic kidney disease: Secondary | ICD-10-CM | POA: Diagnosis not present

## 2017-06-03 DIAGNOSIS — N2581 Secondary hyperparathyroidism of renal origin: Secondary | ICD-10-CM | POA: Diagnosis not present

## 2017-06-03 DIAGNOSIS — Z992 Dependence on renal dialysis: Secondary | ICD-10-CM | POA: Diagnosis not present

## 2017-06-03 DIAGNOSIS — Z283 Underimmunization status: Secondary | ICD-10-CM | POA: Diagnosis not present

## 2017-06-03 DIAGNOSIS — N186 End stage renal disease: Secondary | ICD-10-CM | POA: Diagnosis not present

## 2017-06-03 DIAGNOSIS — D509 Iron deficiency anemia, unspecified: Secondary | ICD-10-CM | POA: Diagnosis not present

## 2017-06-03 DIAGNOSIS — Z23 Encounter for immunization: Secondary | ICD-10-CM | POA: Diagnosis not present

## 2017-06-03 DIAGNOSIS — D631 Anemia in chronic kidney disease: Secondary | ICD-10-CM | POA: Diagnosis not present

## 2017-06-08 ENCOUNTER — Encounter: Payer: Self-pay | Admitting: Nephrology

## 2017-06-08 DIAGNOSIS — D631 Anemia in chronic kidney disease: Secondary | ICD-10-CM | POA: Diagnosis not present

## 2017-06-08 DIAGNOSIS — Z992 Dependence on renal dialysis: Secondary | ICD-10-CM | POA: Diagnosis not present

## 2017-06-08 DIAGNOSIS — D509 Iron deficiency anemia, unspecified: Secondary | ICD-10-CM | POA: Diagnosis not present

## 2017-06-08 DIAGNOSIS — N186 End stage renal disease: Secondary | ICD-10-CM | POA: Diagnosis not present

## 2017-06-08 DIAGNOSIS — Z283 Underimmunization status: Secondary | ICD-10-CM | POA: Diagnosis not present

## 2017-06-08 DIAGNOSIS — N2581 Secondary hyperparathyroidism of renal origin: Secondary | ICD-10-CM | POA: Diagnosis not present

## 2017-06-10 DIAGNOSIS — D631 Anemia in chronic kidney disease: Secondary | ICD-10-CM | POA: Diagnosis not present

## 2017-06-10 DIAGNOSIS — N2581 Secondary hyperparathyroidism of renal origin: Secondary | ICD-10-CM | POA: Diagnosis not present

## 2017-06-10 DIAGNOSIS — Z992 Dependence on renal dialysis: Secondary | ICD-10-CM | POA: Diagnosis not present

## 2017-06-10 DIAGNOSIS — N186 End stage renal disease: Secondary | ICD-10-CM | POA: Diagnosis not present

## 2017-06-10 DIAGNOSIS — Z283 Underimmunization status: Secondary | ICD-10-CM | POA: Diagnosis not present

## 2017-06-10 DIAGNOSIS — D509 Iron deficiency anemia, unspecified: Secondary | ICD-10-CM | POA: Diagnosis not present

## 2017-06-12 ENCOUNTER — Ambulatory Visit: Payer: Medicare Other | Admitting: Cardiology

## 2017-06-13 ENCOUNTER — Encounter: Payer: Self-pay | Admitting: Nephrology

## 2017-06-13 DIAGNOSIS — N186 End stage renal disease: Secondary | ICD-10-CM | POA: Diagnosis not present

## 2017-06-13 DIAGNOSIS — Z992 Dependence on renal dialysis: Secondary | ICD-10-CM | POA: Diagnosis not present

## 2017-06-13 DIAGNOSIS — D509 Iron deficiency anemia, unspecified: Secondary | ICD-10-CM | POA: Diagnosis not present

## 2017-06-13 DIAGNOSIS — D631 Anemia in chronic kidney disease: Secondary | ICD-10-CM | POA: Diagnosis not present

## 2017-06-13 DIAGNOSIS — Z283 Underimmunization status: Secondary | ICD-10-CM | POA: Diagnosis not present

## 2017-06-13 DIAGNOSIS — N2581 Secondary hyperparathyroidism of renal origin: Secondary | ICD-10-CM | POA: Diagnosis not present

## 2017-06-15 DIAGNOSIS — Z992 Dependence on renal dialysis: Secondary | ICD-10-CM | POA: Diagnosis not present

## 2017-06-15 DIAGNOSIS — N186 End stage renal disease: Secondary | ICD-10-CM | POA: Diagnosis not present

## 2017-06-15 DIAGNOSIS — Z283 Underimmunization status: Secondary | ICD-10-CM | POA: Diagnosis not present

## 2017-06-15 DIAGNOSIS — D631 Anemia in chronic kidney disease: Secondary | ICD-10-CM | POA: Diagnosis not present

## 2017-06-15 DIAGNOSIS — D509 Iron deficiency anemia, unspecified: Secondary | ICD-10-CM | POA: Diagnosis not present

## 2017-06-15 DIAGNOSIS — N2581 Secondary hyperparathyroidism of renal origin: Secondary | ICD-10-CM | POA: Diagnosis not present

## 2017-06-17 DIAGNOSIS — Z992 Dependence on renal dialysis: Secondary | ICD-10-CM | POA: Diagnosis not present

## 2017-06-17 DIAGNOSIS — N186 End stage renal disease: Secondary | ICD-10-CM | POA: Diagnosis not present

## 2017-06-17 DIAGNOSIS — D509 Iron deficiency anemia, unspecified: Secondary | ICD-10-CM | POA: Diagnosis not present

## 2017-06-17 DIAGNOSIS — N2581 Secondary hyperparathyroidism of renal origin: Secondary | ICD-10-CM | POA: Diagnosis not present

## 2017-06-17 DIAGNOSIS — Z283 Underimmunization status: Secondary | ICD-10-CM | POA: Diagnosis not present

## 2017-06-17 DIAGNOSIS — D631 Anemia in chronic kidney disease: Secondary | ICD-10-CM | POA: Diagnosis not present

## 2017-06-20 DIAGNOSIS — D509 Iron deficiency anemia, unspecified: Secondary | ICD-10-CM | POA: Diagnosis not present

## 2017-06-20 DIAGNOSIS — N186 End stage renal disease: Secondary | ICD-10-CM | POA: Diagnosis not present

## 2017-06-20 DIAGNOSIS — Z992 Dependence on renal dialysis: Secondary | ICD-10-CM | POA: Diagnosis not present

## 2017-06-20 DIAGNOSIS — D631 Anemia in chronic kidney disease: Secondary | ICD-10-CM | POA: Diagnosis not present

## 2017-06-20 DIAGNOSIS — Z283 Underimmunization status: Secondary | ICD-10-CM | POA: Diagnosis not present

## 2017-06-20 DIAGNOSIS — N2581 Secondary hyperparathyroidism of renal origin: Secondary | ICD-10-CM | POA: Diagnosis not present

## 2017-06-22 DIAGNOSIS — N2581 Secondary hyperparathyroidism of renal origin: Secondary | ICD-10-CM | POA: Diagnosis not present

## 2017-06-22 DIAGNOSIS — D631 Anemia in chronic kidney disease: Secondary | ICD-10-CM | POA: Diagnosis not present

## 2017-06-22 DIAGNOSIS — N186 End stage renal disease: Secondary | ICD-10-CM | POA: Diagnosis not present

## 2017-06-22 DIAGNOSIS — D509 Iron deficiency anemia, unspecified: Secondary | ICD-10-CM | POA: Diagnosis not present

## 2017-06-22 DIAGNOSIS — Z992 Dependence on renal dialysis: Secondary | ICD-10-CM | POA: Diagnosis not present

## 2017-06-22 DIAGNOSIS — Z283 Underimmunization status: Secondary | ICD-10-CM | POA: Diagnosis not present

## 2017-06-24 DIAGNOSIS — Z992 Dependence on renal dialysis: Secondary | ICD-10-CM | POA: Diagnosis not present

## 2017-06-24 DIAGNOSIS — N2581 Secondary hyperparathyroidism of renal origin: Secondary | ICD-10-CM | POA: Diagnosis not present

## 2017-06-24 DIAGNOSIS — N186 End stage renal disease: Secondary | ICD-10-CM | POA: Diagnosis not present

## 2017-06-24 DIAGNOSIS — D509 Iron deficiency anemia, unspecified: Secondary | ICD-10-CM | POA: Diagnosis not present

## 2017-06-24 DIAGNOSIS — Z283 Underimmunization status: Secondary | ICD-10-CM | POA: Diagnosis not present

## 2017-06-24 DIAGNOSIS — D631 Anemia in chronic kidney disease: Secondary | ICD-10-CM | POA: Diagnosis not present

## 2017-06-27 DIAGNOSIS — N186 End stage renal disease: Secondary | ICD-10-CM | POA: Diagnosis not present

## 2017-06-27 DIAGNOSIS — Z283 Underimmunization status: Secondary | ICD-10-CM | POA: Diagnosis not present

## 2017-06-27 DIAGNOSIS — Z992 Dependence on renal dialysis: Secondary | ICD-10-CM | POA: Diagnosis not present

## 2017-06-27 DIAGNOSIS — N2581 Secondary hyperparathyroidism of renal origin: Secondary | ICD-10-CM | POA: Diagnosis not present

## 2017-06-27 DIAGNOSIS — D631 Anemia in chronic kidney disease: Secondary | ICD-10-CM | POA: Diagnosis not present

## 2017-06-27 DIAGNOSIS — D509 Iron deficiency anemia, unspecified: Secondary | ICD-10-CM | POA: Diagnosis not present

## 2017-07-01 DIAGNOSIS — I482 Chronic atrial fibrillation: Secondary | ICD-10-CM | POA: Diagnosis not present

## 2017-07-01 DIAGNOSIS — N186 End stage renal disease: Secondary | ICD-10-CM | POA: Diagnosis not present

## 2017-07-01 DIAGNOSIS — N2581 Secondary hyperparathyroidism of renal origin: Secondary | ICD-10-CM | POA: Diagnosis not present

## 2017-07-01 DIAGNOSIS — Z283 Underimmunization status: Secondary | ICD-10-CM | POA: Diagnosis not present

## 2017-07-01 DIAGNOSIS — D509 Iron deficiency anemia, unspecified: Secondary | ICD-10-CM | POA: Diagnosis not present

## 2017-07-01 DIAGNOSIS — Z992 Dependence on renal dialysis: Secondary | ICD-10-CM | POA: Diagnosis not present

## 2017-07-01 DIAGNOSIS — D631 Anemia in chronic kidney disease: Secondary | ICD-10-CM | POA: Diagnosis not present

## 2017-07-03 DIAGNOSIS — I129 Hypertensive chronic kidney disease with stage 1 through stage 4 chronic kidney disease, or unspecified chronic kidney disease: Secondary | ICD-10-CM | POA: Diagnosis not present

## 2017-07-03 DIAGNOSIS — Z992 Dependence on renal dialysis: Secondary | ICD-10-CM | POA: Diagnosis not present

## 2017-07-03 DIAGNOSIS — N186 End stage renal disease: Secondary | ICD-10-CM | POA: Diagnosis not present

## 2017-07-04 DIAGNOSIS — Z992 Dependence on renal dialysis: Secondary | ICD-10-CM | POA: Diagnosis not present

## 2017-07-04 DIAGNOSIS — N2581 Secondary hyperparathyroidism of renal origin: Secondary | ICD-10-CM | POA: Diagnosis not present

## 2017-07-04 DIAGNOSIS — N186 End stage renal disease: Secondary | ICD-10-CM | POA: Diagnosis not present

## 2017-07-07 ENCOUNTER — Other Ambulatory Visit: Payer: Self-pay

## 2017-07-07 ENCOUNTER — Ambulatory Visit (INDEPENDENT_AMBULATORY_CARE_PROVIDER_SITE_OTHER): Payer: Medicare Other | Admitting: Cardiology

## 2017-07-07 ENCOUNTER — Other Ambulatory Visit: Payer: Self-pay | Admitting: Pharmacist

## 2017-07-07 ENCOUNTER — Encounter: Payer: Self-pay | Admitting: Cardiology

## 2017-07-07 ENCOUNTER — Ambulatory Visit (INDEPENDENT_AMBULATORY_CARE_PROVIDER_SITE_OTHER): Payer: Medicare Other | Admitting: Pharmacist

## 2017-07-07 VITALS — BP 120/78 | HR 60 | Ht 74.0 in | Wt 191.2 lb

## 2017-07-07 DIAGNOSIS — I7101 Dissection of thoracic aorta: Secondary | ICD-10-CM | POA: Diagnosis not present

## 2017-07-07 DIAGNOSIS — N186 End stage renal disease: Secondary | ICD-10-CM

## 2017-07-07 DIAGNOSIS — I480 Paroxysmal atrial fibrillation: Secondary | ICD-10-CM | POA: Diagnosis not present

## 2017-07-07 DIAGNOSIS — I7103 Dissection of thoracoabdominal aorta: Secondary | ICD-10-CM | POA: Diagnosis not present

## 2017-07-07 DIAGNOSIS — Z7901 Long term (current) use of anticoagulants: Secondary | ICD-10-CM

## 2017-07-07 DIAGNOSIS — I4891 Unspecified atrial fibrillation: Secondary | ICD-10-CM

## 2017-07-07 DIAGNOSIS — I71019 Dissection of thoracic aorta, unspecified: Secondary | ICD-10-CM

## 2017-07-07 LAB — POCT INR: INR: 1.9

## 2017-07-07 MED ORDER — WARFARIN SODIUM 5 MG PO TABS: ORAL_TABLET | ORAL | 0 refills | Status: DC

## 2017-07-07 NOTE — Patient Instructions (Signed)
Description   Take 1.5 tablets today then continue taking 1.5 tablets daily except 1 tablet on Mondays and Fridays. Recheck INR in 2 weeks. Continue eating dark green leafy veggies 2-3 times a week.

## 2017-07-07 NOTE — Progress Notes (Signed)
Cardiology Office Note    Date:  07/07/2017   ID:  Corey [name], DOB 08/04/1970, MRN [id number]  PCP:  Clent Demark, PA-C  Cardiologist:   Candee Furbish, MD     History of Present Illness:  Corey [name] is a 47 y.o. male here for follow up of atrial fibrillation with end-stage renal disease on hemodialysis, type B stable aortic dissection with original aortic dissection in 2004 with extensive repair in [place].  He has a history of atrial fibrillation on anticoagulation, transfer from [place]. Has a prior aortic dissection repair on hemodialysis since 2004.  Repaired aorta (remembers having veins out of legs as well).  In 2014 had atrial fibrillation, remembers having cardioversion, heart rate was 140 beats at the time.   He describes his original aorta dissected quite vividly, remembers eating dinner, cleaning the bowl he states and then feeling an extensive excruciating pain in his back like a "pop ".    Prolonged hospitalization he states 2 years, 3 months in a coma. He was paralyzed from the waist down. Had a pneumothorax as well.  He had another hospitalization here in October 2018.  Minimally elevated troponin.  CT scan showed stable type B aortic dissection originating distal from left subclavian.  Today he is doing quite well.  He will occasionally get a fleeting 2-minute left chest wall type discomfort.  Nonexertional.  No fevers chills nausea vomiting.  Post hospitalization in November 2018 he had a repeat CT scan which showed stable type B dissection.   Past Medical History:  Diagnosis Date  . A-fib ([place])   . Arthritis    hands and shoulders  . Blindness and low vision    "Stargardt disease"  . Dissection of aorta ([place]) 2004  . Dysrhythmia    A-fib  . ESRD (end stage renal disease) ([place])   . Headache   . History of cardioversion 2014  . Hypertension   . Neuropathy   . Non-healing non-surgical wound 03/2016  . Paralysis ([place])    due to dissection of aorta  in 2004, lower extremities  . Pneumonia     Past Surgical History:  Procedure Laterality Date  . APPLICATION OF WOUND VAC Left 04/13/2016   Procedure: APPLICATION OF WOUND VAC;  Surgeon: Conrad Bluffdale, MD;  Location: [facility];  Service: Vascular;  Laterality: Left;  . APPLICATION OF WOUND VAC Left 04/18/2016   Procedure: APPLICATION OF WOUND VAC;  Surgeon: Waynetta Sandy, MD;  Location: [facility];  Service: Vascular;  Laterality: Left;  Wound vac change   . APPLICATION OF WOUND VAC Left 04/20/2016   Procedure: WOUND VAC CHANGE;  Surgeon: Conrad Minturn, MD;  Location: [facility];  Service: Vascular;  Laterality: Left;  . AV FISTULA PLACEMENT    . BASCILIC VEIN TRANSPOSITION Left 12/09/2015   Procedure: FIRST STAGE BASILIC VEIN TRANSPOSITION LEFT UPPER ARM;  Surgeon: Conrad Barry, MD;  Location: [facility];  Service: Vascular;  Laterality: Left;  . BASCILIC VEIN TRANSPOSITION Left 03/09/2016   Procedure: SECOND STAGE BASILIC VEIN TRANSPOSITION WITH REVISION OF ANASTOMOSIS LEFT UPPER ARM;  Surgeon: Conrad Berthold, MD;  Location: [facility];  Service: Vascular;  Laterality: Left;  . CARDIOVERSION    . REPAIR OF ACUTE ASCENDING THORACIC AORTIC DISSECTION    . REVISON OF ARTERIOVENOUS FISTULA Left 04/20/2016   Procedure: LIGATION OF BASILIC VEIN TRANSPOSITION;  Surgeon: Conrad Chiloquin, MD;  Location: [facility];  Service: Vascular;  Laterality: Left;  .  WOUND DEBRIDEMENT Left 04/13/2016   Procedure: DEBRIDEMENT WOUND;  Surgeon: Conrad [place] Springs, MD;  Location: [facility];  Service: Vascular;  Laterality: Left;    Current Medications: Outpatient Medications Prior to Visit  Medication Sig Dispense Refill  . cinacalcet (SENSIPAR) 30 MG tablet Take 2 tablets (60 mg total) by mouth daily with supper. 60 tablet 0  . labetalol (NORMODYNE) 100 MG tablet Take 1 tablet (100 mg total) by mouth 2 (two) times daily. 180 tablet 3  . sevelamer carbonate (RENVELA) 0.8 g PACK packet Take 1.6 g by mouth 3 (three) times daily with meals. 270 each 0    . warfarin (COUMADIN) 7.5 MG tablet Take 1 tablet (7.5 mg total) by mouth daily. 30 tablet 0   No facility-administered medications prior to visit.      Allergies:   Ciprofloxacin and Heparin   Social History   Socioeconomic History  . Marital status: Divorced    Spouse name: Not on file  . Number of children: 2  . Years of education: Not on file  . Highest education level: Not on file  Occupational History  . Occupation: DIABLED  Social Needs  . Financial resource strain: Not on file  . Food insecurity:    Worry: Not on file    Inability: Not on file  . Transportation needs:    Medical: Not on file    Non-medical: Not on file  Tobacco Use  . Smoking status: Never Smoker  . Smokeless tobacco: Never Used  Substance and Sexual Activity  . Alcohol use: No  . Drug use: No  . Sexual activity: Not on file  Lifestyle  . Physical activity:    Days per week: Not on file    Minutes per session: Not on file  . Stress: Not on file  Relationships  . Social connections:    Talks on phone: Not on file    Gets together: Not on file    Attends religious service: Not on file    Active member of club or organization: Not on file    Attends meetings of clubs or organizations: Not on file    Relationship status: Not on file  Other Topics Concern  . Not on file  Social History Narrative  . Not on file     Family History:  The patient's family history includes Cancer in his father and mother; Hypertension in his father and mother; Stroke in his brother; Thyroid disease in his sister.   ROS:   Please see the history of present illness.    Review of Systems  All other systems reviewed and are negative.     PHYSICAL EXAM:   VS:  BP 120/78   Pulse 60   Ht 6\' 2"  (1.88 m)   Wt 191 lb 3.2 oz (86.7 kg)   BMI 24.55 kg/m    GEN: Well nourished, well developed, in no acute distress, in wheelchair HEENT: normal  Neck: no JVD, carotid bruits, or masses Cardiac: RRR; no murmurs,  rubs, or gallops,no edema  Respiratory:  clear to auscultation bilaterally, normal work of breathing GI: soft, nontender, nondistended, + BS MS: no deformity or atrophy  Skin: warm and dry, no rash Neuro:  Alert and Oriented x 3, Strength and sensation are intact Psych: euthymic mood, full affect   Wt Readings from Last 3 Encounters:  07/07/17 191 lb 3.2 oz (86.7 kg)  04/19/17 199 lb 6.4 oz (90.4 kg)  02/15/17 193 lb (87.5 kg)  Studies/Labs Reviewed:   EKG:   01/22/17-sinus rhythm 75 no other abnormalities.  Personally viewed-prior 12/02/15-sinus rhythm 73 bpm with no other abnormalities.  Recent Labs: 08/31/2016: TSH 1.430 01/18/2017: ALT 10 01/22/2017: Magnesium 1.9 01/26/2017: BUN 35; Creatinine, Ser 8.88; Potassium 4.1; Sodium 138 02/13/2017: Hemoglobin 13.8; Platelets 131   Lipid Panel No results found for: CHOL, TRIG, HDL, CHOLHDL, VLDL, LDLCALC, LDLDIRECT  Additional studies/ records that were reviewed today include:   Dialysis office notes reviewed. Lab work reviewed.  Office notes, hospital notes, prior CT scan and repeat CT scan reviewed  ECHO:01/19/2017  - Left ventricle: The cavity size was normal. There was mild   concentric hypertrophy. Systolic function was vigorous. The   estimated ejection fraction was in the range of 65% to 70%. Wall   motion was normal; there were no regional wall motion   abnormalities. Features are consistent with a pseudonormal left   ventricular filling pattern, with concomitant abnormal relaxation   and increased filling pressure (grade 2 diastolic dysfunction).   Doppler parameters are consistent with elevated ventricular   end-diastolic filling pressure. - Aortic root: The aortic root was normal in size. - Ascending aorta: The ascending aorta was normal in size. - Mitral valve: Calcified annulus. Mildly thickened leaflets .   There was mild regurgitation. - Left atrium: The atrium was mildly dilated. - Right ventricle:  The cavity size was moderately dilated. Wall   thickness was normal. Systolic function was normal. - Right atrium: The atrium was moderately dilated. - Pulmonary arteries: Systolic pressure was moderately increased.   PA peak pressure: 55 mm Hg (S). - Inferior vena cava: The vessel was dilated. The respirophasic   diameter changes were blunted (< 50%), consistent with elevated   central venous pressure. - Pericardium, extracardiac: There was no pericardial effusion.   There was a left pleural effusion.  ASSESSMENT:    1. Dissection of thoracic aorta (HCC)   2. Paroxysmal atrial fibrillation ([place])   3. Dissection of thoracoabdominal aorta ([place])   4. Chronic anticoagulation   5. End stage renal disease ([place])      PLAN:  In order of problems listed above:  Paroxysmal atrial fibrillation  - Cardioversion 2014  - Currently normal sinus rhythm, has been maintaining since.  - Has been on Coumadin since 2004  - We will continue given his extensive history of vascular repair.  Echocardiogram reviewed as above.  Normal EF.  Continue with labetalol.  Coumadin.   Chronic anticoagulation  -Having his anticoagulation monitored.  Aortic dissection  - 2004. Extensive repair. States that he was in the hospital for 2 years. Partial paralysis waist down. Renal failure.  - Father died of enlarged heart however he has no other family members with aortic dissection.  - He states he was cleaning his bowl after dinner and felt a pop in his back which was excruciating.  - He also states that they had to utilize veins in his leg as part of the repair, I wonder if he is had bypass to his coronary arteries as well or perhaps 2 other major vessels.   -Repeat CT scan after hospitalization in October and November, stable type B dissection originating from the left subclavian distally.  We will have him follow-up with Dr. Darcey Nora as well.  He did write a brief note on him in the October hospitalization.  We  have described to him medical management.  No Cipro.  No Valsalva type maneuvers.  I am fine with him undergoing  physical therapy.  No excessive heavy lifting.  Essential hypertension  - Currently on labetalol.  Stable, doing well.  - During hemodialysis, generally monitoring his blood pressure closely. If necessary, agents can be added by nephrology.      Medication Adjustments/Labs and Tests Ordered: Current medicines are reviewed at length with the patient today.  Concerns regarding medicines are outlined above.  Medication changes, Labs and Tests ordered today are listed in the Patient Instructions below. Patient Instructions  Medication Instructions:  Your physician recommends that you continue on your current medications as directed. Please refer to the Current Medication list given to you today. 1. Do not use Ciprofloxacin due to Aortic Dissection, also listed on your allergies today.   Labwork: -None  Testing/Procedures: -None Follow-Up: Your physician wants you to follow-up in: 6 months with Robbie Lis, PA You will receive a reminder letter in the mail two months in advance. If you don't receive a letter, please call our office to schedule the follow-up appointment. Your physician wants you to follow-up in: 1 year with Dr.Skains.  You will receive a reminder letter in the mail two months in advance. If you don't receive a letter, please call our office to schedule the follow-up appointment. You have been referred to [facility] with Dr. Nils Pyle for Aortic Dissection, that office will call to schedule appointment for 3-4 months.     Any Other Special Instructions Will Be Listed Below (If Applicable).     If you need a refill on your cardiac medications before your next appointment, please call your pharmacy.      Signed, Candee Furbish, MD  07/07/2017 10:32 AM    [facility] [address], [place], [place]  [address] Phone: [phone]; Fax: [phone]

## 2017-07-07 NOTE — Patient Instructions (Addendum)
Medication Instructions:  Your physician recommends that you continue on your current medications as directed. Please refer to the Current Medication list given to you today. 1. Do not use Ciprofloxacin due to Aortic Dissection, also listed on your allergies today.   Labwork: -None  Testing/Procedures: -None Follow-Up: Your physician wants you to follow-up in: 6 months with Robbie Lis, PA You will receive a reminder letter in the mail two months in advance. If you don't receive a letter, please call our office to schedule the follow-up appointment. Your physician wants you to follow-up in: 1 year with Dr.Skains.  You will receive a reminder letter in the mail two months in advance. If you don't receive a letter, please call our office to schedule the follow-up appointment. You have been referred to [facility] with Dr. Nils Pyle for Aortic Dissection, that office will call to schedule appointment for 3-4 months.     Any Other Special Instructions Will Be Listed Below (If Applicable).     If you need a refill on your cardiac medications before your next appointment, please call your pharmacy.

## 2017-07-08 DIAGNOSIS — Z992 Dependence on renal dialysis: Secondary | ICD-10-CM | POA: Diagnosis not present

## 2017-07-08 DIAGNOSIS — N186 End stage renal disease: Secondary | ICD-10-CM | POA: Diagnosis not present

## 2017-07-08 DIAGNOSIS — N2581 Secondary hyperparathyroidism of renal origin: Secondary | ICD-10-CM | POA: Diagnosis not present

## 2017-07-11 DIAGNOSIS — Z992 Dependence on renal dialysis: Secondary | ICD-10-CM | POA: Diagnosis not present

## 2017-07-11 DIAGNOSIS — N186 End stage renal disease: Secondary | ICD-10-CM | POA: Diagnosis not present

## 2017-07-11 DIAGNOSIS — N2581 Secondary hyperparathyroidism of renal origin: Secondary | ICD-10-CM | POA: Diagnosis not present

## 2017-07-13 DIAGNOSIS — N186 End stage renal disease: Secondary | ICD-10-CM | POA: Diagnosis not present

## 2017-07-13 DIAGNOSIS — N2581 Secondary hyperparathyroidism of renal origin: Secondary | ICD-10-CM | POA: Diagnosis not present

## 2017-07-13 DIAGNOSIS — Z992 Dependence on renal dialysis: Secondary | ICD-10-CM | POA: Diagnosis not present

## 2017-07-18 DIAGNOSIS — Z992 Dependence on renal dialysis: Secondary | ICD-10-CM | POA: Diagnosis not present

## 2017-07-18 DIAGNOSIS — N186 End stage renal disease: Secondary | ICD-10-CM | POA: Diagnosis not present

## 2017-07-18 DIAGNOSIS — N2581 Secondary hyperparathyroidism of renal origin: Secondary | ICD-10-CM | POA: Diagnosis not present

## 2017-07-20 DIAGNOSIS — N186 End stage renal disease: Secondary | ICD-10-CM | POA: Diagnosis not present

## 2017-07-20 DIAGNOSIS — Z992 Dependence on renal dialysis: Secondary | ICD-10-CM | POA: Diagnosis not present

## 2017-07-20 DIAGNOSIS — N2581 Secondary hyperparathyroidism of renal origin: Secondary | ICD-10-CM | POA: Diagnosis not present

## 2017-07-22 DIAGNOSIS — N186 End stage renal disease: Secondary | ICD-10-CM | POA: Diagnosis not present

## 2017-07-22 DIAGNOSIS — Z992 Dependence on renal dialysis: Secondary | ICD-10-CM | POA: Diagnosis not present

## 2017-07-22 DIAGNOSIS — N2581 Secondary hyperparathyroidism of renal origin: Secondary | ICD-10-CM | POA: Diagnosis not present

## 2017-07-25 DIAGNOSIS — Z992 Dependence on renal dialysis: Secondary | ICD-10-CM | POA: Diagnosis not present

## 2017-07-25 DIAGNOSIS — N186 End stage renal disease: Secondary | ICD-10-CM | POA: Diagnosis not present

## 2017-07-25 DIAGNOSIS — N2581 Secondary hyperparathyroidism of renal origin: Secondary | ICD-10-CM | POA: Diagnosis not present

## 2017-07-28 ENCOUNTER — Ambulatory Visit (INDEPENDENT_AMBULATORY_CARE_PROVIDER_SITE_OTHER): Payer: Medicare Other | Admitting: *Deleted

## 2017-07-28 DIAGNOSIS — Z7901 Long term (current) use of anticoagulants: Secondary | ICD-10-CM | POA: Diagnosis not present

## 2017-07-28 DIAGNOSIS — I4891 Unspecified atrial fibrillation: Secondary | ICD-10-CM

## 2017-07-28 LAB — POCT INR: INR: 2.7

## 2017-07-28 NOTE — Patient Instructions (Signed)
Description   Continue taking 1.5 tablets daily except 1 tablet on Mondays and Fridays. Recheck INR in 3 weeks. Continue eating dark green leafy veggies 2-3 times a week.

## 2017-07-29 DIAGNOSIS — N186 End stage renal disease: Secondary | ICD-10-CM | POA: Diagnosis not present

## 2017-07-29 DIAGNOSIS — I482 Chronic atrial fibrillation: Secondary | ICD-10-CM | POA: Diagnosis not present

## 2017-07-29 DIAGNOSIS — Z992 Dependence on renal dialysis: Secondary | ICD-10-CM | POA: Diagnosis not present

## 2017-07-29 DIAGNOSIS — N2581 Secondary hyperparathyroidism of renal origin: Secondary | ICD-10-CM | POA: Diagnosis not present

## 2017-08-01 DIAGNOSIS — N2581 Secondary hyperparathyroidism of renal origin: Secondary | ICD-10-CM | POA: Diagnosis not present

## 2017-08-01 DIAGNOSIS — Z992 Dependence on renal dialysis: Secondary | ICD-10-CM | POA: Diagnosis not present

## 2017-08-01 DIAGNOSIS — N186 End stage renal disease: Secondary | ICD-10-CM | POA: Diagnosis not present

## 2017-08-02 DIAGNOSIS — N186 End stage renal disease: Secondary | ICD-10-CM | POA: Diagnosis not present

## 2017-08-02 DIAGNOSIS — Z992 Dependence on renal dialysis: Secondary | ICD-10-CM | POA: Diagnosis not present

## 2017-08-02 DIAGNOSIS — I129 Hypertensive chronic kidney disease with stage 1 through stage 4 chronic kidney disease, or unspecified chronic kidney disease: Secondary | ICD-10-CM | POA: Diagnosis not present

## 2017-08-03 DIAGNOSIS — Z992 Dependence on renal dialysis: Secondary | ICD-10-CM | POA: Diagnosis not present

## 2017-08-03 DIAGNOSIS — N186 End stage renal disease: Secondary | ICD-10-CM | POA: Diagnosis not present

## 2017-08-03 DIAGNOSIS — N2581 Secondary hyperparathyroidism of renal origin: Secondary | ICD-10-CM | POA: Diagnosis not present

## 2017-08-05 DIAGNOSIS — N186 End stage renal disease: Secondary | ICD-10-CM | POA: Diagnosis not present

## 2017-08-05 DIAGNOSIS — N2581 Secondary hyperparathyroidism of renal origin: Secondary | ICD-10-CM | POA: Diagnosis not present

## 2017-08-05 DIAGNOSIS — Z992 Dependence on renal dialysis: Secondary | ICD-10-CM | POA: Diagnosis not present

## 2017-08-08 DIAGNOSIS — N2581 Secondary hyperparathyroidism of renal origin: Secondary | ICD-10-CM | POA: Diagnosis not present

## 2017-08-08 DIAGNOSIS — Z992 Dependence on renal dialysis: Secondary | ICD-10-CM | POA: Diagnosis not present

## 2017-08-08 DIAGNOSIS — N186 End stage renal disease: Secondary | ICD-10-CM | POA: Diagnosis not present

## 2017-08-10 DIAGNOSIS — Z992 Dependence on renal dialysis: Secondary | ICD-10-CM | POA: Diagnosis not present

## 2017-08-10 DIAGNOSIS — N2581 Secondary hyperparathyroidism of renal origin: Secondary | ICD-10-CM | POA: Diagnosis not present

## 2017-08-10 DIAGNOSIS — N186 End stage renal disease: Secondary | ICD-10-CM | POA: Diagnosis not present

## 2017-08-12 DIAGNOSIS — N2581 Secondary hyperparathyroidism of renal origin: Secondary | ICD-10-CM | POA: Diagnosis not present

## 2017-08-12 DIAGNOSIS — N186 End stage renal disease: Secondary | ICD-10-CM | POA: Diagnosis not present

## 2017-08-12 DIAGNOSIS — Z992 Dependence on renal dialysis: Secondary | ICD-10-CM | POA: Diagnosis not present

## 2017-08-17 DIAGNOSIS — Z992 Dependence on renal dialysis: Secondary | ICD-10-CM | POA: Diagnosis not present

## 2017-08-17 DIAGNOSIS — N2581 Secondary hyperparathyroidism of renal origin: Secondary | ICD-10-CM | POA: Diagnosis not present

## 2017-08-17 DIAGNOSIS — N186 End stage renal disease: Secondary | ICD-10-CM | POA: Diagnosis not present

## 2017-08-19 DIAGNOSIS — N186 End stage renal disease: Secondary | ICD-10-CM | POA: Diagnosis not present

## 2017-08-19 DIAGNOSIS — Z992 Dependence on renal dialysis: Secondary | ICD-10-CM | POA: Diagnosis not present

## 2017-08-19 DIAGNOSIS — N2581 Secondary hyperparathyroidism of renal origin: Secondary | ICD-10-CM | POA: Diagnosis not present

## 2017-08-21 ENCOUNTER — Ambulatory Visit (INDEPENDENT_AMBULATORY_CARE_PROVIDER_SITE_OTHER): Payer: Medicare Other | Admitting: *Deleted

## 2017-08-21 DIAGNOSIS — I482 Chronic atrial fibrillation, unspecified: Secondary | ICD-10-CM

## 2017-08-21 DIAGNOSIS — Z5181 Encounter for therapeutic drug level monitoring: Secondary | ICD-10-CM

## 2017-08-21 DIAGNOSIS — I4891 Unspecified atrial fibrillation: Secondary | ICD-10-CM | POA: Diagnosis not present

## 2017-08-21 DIAGNOSIS — Z7901 Long term (current) use of anticoagulants: Secondary | ICD-10-CM

## 2017-08-21 LAB — POCT INR: INR: 1.5 — AB (ref 2.0–3.0)

## 2017-08-21 MED ORDER — WARFARIN SODIUM 5 MG PO TABS: ORAL_TABLET | ORAL | 0 refills | Status: DC

## 2017-08-21 NOTE — Patient Instructions (Signed)
Description   Today May 20th take 1 and 1/2 tablet (7.5mg ) then tomorrow May 21st take 2 tablets (10mg ) then continue taking 1 and 1/2 tablets daily except 1 tablet on Mondays and Fridays. Recheck INR in 2 weeks. Continue eating dark green leafy veggies 2-3 times a week.

## 2017-08-22 DIAGNOSIS — Z992 Dependence on renal dialysis: Secondary | ICD-10-CM | POA: Diagnosis not present

## 2017-08-22 DIAGNOSIS — N2581 Secondary hyperparathyroidism of renal origin: Secondary | ICD-10-CM | POA: Diagnosis not present

## 2017-08-22 DIAGNOSIS — N186 End stage renal disease: Secondary | ICD-10-CM | POA: Diagnosis not present

## 2017-08-24 DIAGNOSIS — N186 End stage renal disease: Secondary | ICD-10-CM | POA: Diagnosis not present

## 2017-08-24 DIAGNOSIS — N2581 Secondary hyperparathyroidism of renal origin: Secondary | ICD-10-CM | POA: Diagnosis not present

## 2017-08-24 DIAGNOSIS — I482 Chronic atrial fibrillation: Secondary | ICD-10-CM | POA: Diagnosis not present

## 2017-08-24 DIAGNOSIS — Z992 Dependence on renal dialysis: Secondary | ICD-10-CM | POA: Diagnosis not present

## 2017-08-26 DIAGNOSIS — Z992 Dependence on renal dialysis: Secondary | ICD-10-CM | POA: Diagnosis not present

## 2017-08-26 DIAGNOSIS — N2581 Secondary hyperparathyroidism of renal origin: Secondary | ICD-10-CM | POA: Diagnosis not present

## 2017-08-26 DIAGNOSIS — N186 End stage renal disease: Secondary | ICD-10-CM | POA: Diagnosis not present

## 2017-08-31 DIAGNOSIS — N2581 Secondary hyperparathyroidism of renal origin: Secondary | ICD-10-CM | POA: Diagnosis not present

## 2017-08-31 DIAGNOSIS — N186 End stage renal disease: Secondary | ICD-10-CM | POA: Diagnosis not present

## 2017-08-31 DIAGNOSIS — Z992 Dependence on renal dialysis: Secondary | ICD-10-CM | POA: Diagnosis not present

## 2017-09-02 DIAGNOSIS — Z992 Dependence on renal dialysis: Secondary | ICD-10-CM | POA: Diagnosis not present

## 2017-09-02 DIAGNOSIS — N186 End stage renal disease: Secondary | ICD-10-CM | POA: Diagnosis not present

## 2017-09-02 DIAGNOSIS — N2581 Secondary hyperparathyroidism of renal origin: Secondary | ICD-10-CM | POA: Diagnosis not present

## 2017-09-02 DIAGNOSIS — I129 Hypertensive chronic kidney disease with stage 1 through stage 4 chronic kidney disease, or unspecified chronic kidney disease: Secondary | ICD-10-CM | POA: Diagnosis not present

## 2017-09-05 DIAGNOSIS — N186 End stage renal disease: Secondary | ICD-10-CM | POA: Diagnosis not present

## 2017-09-05 DIAGNOSIS — N2581 Secondary hyperparathyroidism of renal origin: Secondary | ICD-10-CM | POA: Diagnosis not present

## 2017-09-05 DIAGNOSIS — Z992 Dependence on renal dialysis: Secondary | ICD-10-CM | POA: Diagnosis not present

## 2017-09-07 DIAGNOSIS — N186 End stage renal disease: Secondary | ICD-10-CM | POA: Diagnosis not present

## 2017-09-07 DIAGNOSIS — N2581 Secondary hyperparathyroidism of renal origin: Secondary | ICD-10-CM | POA: Diagnosis not present

## 2017-09-07 DIAGNOSIS — Z992 Dependence on renal dialysis: Secondary | ICD-10-CM | POA: Diagnosis not present

## 2017-09-09 DIAGNOSIS — Z992 Dependence on renal dialysis: Secondary | ICD-10-CM | POA: Diagnosis not present

## 2017-09-09 DIAGNOSIS — N2581 Secondary hyperparathyroidism of renal origin: Secondary | ICD-10-CM | POA: Diagnosis not present

## 2017-09-09 DIAGNOSIS — N186 End stage renal disease: Secondary | ICD-10-CM | POA: Diagnosis not present

## 2017-09-12 DIAGNOSIS — Z992 Dependence on renal dialysis: Secondary | ICD-10-CM | POA: Diagnosis not present

## 2017-09-12 DIAGNOSIS — N2581 Secondary hyperparathyroidism of renal origin: Secondary | ICD-10-CM | POA: Diagnosis not present

## 2017-09-12 DIAGNOSIS — N186 End stage renal disease: Secondary | ICD-10-CM | POA: Diagnosis not present

## 2017-09-16 DIAGNOSIS — N186 End stage renal disease: Secondary | ICD-10-CM | POA: Diagnosis not present

## 2017-09-16 DIAGNOSIS — N2581 Secondary hyperparathyroidism of renal origin: Secondary | ICD-10-CM | POA: Diagnosis not present

## 2017-09-16 DIAGNOSIS — Z992 Dependence on renal dialysis: Secondary | ICD-10-CM | POA: Diagnosis not present

## 2017-09-18 ENCOUNTER — Encounter: Payer: Self-pay | Admitting: Nephrology

## 2017-09-19 DIAGNOSIS — Z992 Dependence on renal dialysis: Secondary | ICD-10-CM | POA: Diagnosis not present

## 2017-09-19 DIAGNOSIS — N2581 Secondary hyperparathyroidism of renal origin: Secondary | ICD-10-CM | POA: Diagnosis not present

## 2017-09-19 DIAGNOSIS — N186 End stage renal disease: Secondary | ICD-10-CM | POA: Diagnosis not present

## 2017-09-21 DIAGNOSIS — N186 End stage renal disease: Secondary | ICD-10-CM | POA: Diagnosis not present

## 2017-09-21 DIAGNOSIS — N2581 Secondary hyperparathyroidism of renal origin: Secondary | ICD-10-CM | POA: Diagnosis not present

## 2017-09-21 DIAGNOSIS — Z992 Dependence on renal dialysis: Secondary | ICD-10-CM | POA: Diagnosis not present

## 2017-09-23 DIAGNOSIS — N186 End stage renal disease: Secondary | ICD-10-CM | POA: Diagnosis not present

## 2017-09-23 DIAGNOSIS — N2581 Secondary hyperparathyroidism of renal origin: Secondary | ICD-10-CM | POA: Diagnosis not present

## 2017-09-23 DIAGNOSIS — Z992 Dependence on renal dialysis: Secondary | ICD-10-CM | POA: Diagnosis not present

## 2017-09-26 DIAGNOSIS — N186 End stage renal disease: Secondary | ICD-10-CM | POA: Diagnosis not present

## 2017-09-26 DIAGNOSIS — N2581 Secondary hyperparathyroidism of renal origin: Secondary | ICD-10-CM | POA: Diagnosis not present

## 2017-09-26 DIAGNOSIS — Z992 Dependence on renal dialysis: Secondary | ICD-10-CM | POA: Diagnosis not present

## 2017-09-30 DIAGNOSIS — N2581 Secondary hyperparathyroidism of renal origin: Secondary | ICD-10-CM | POA: Diagnosis not present

## 2017-09-30 DIAGNOSIS — N186 End stage renal disease: Secondary | ICD-10-CM | POA: Diagnosis not present

## 2017-09-30 DIAGNOSIS — Z992 Dependence on renal dialysis: Secondary | ICD-10-CM | POA: Diagnosis not present

## 2017-09-30 DIAGNOSIS — I482 Chronic atrial fibrillation: Secondary | ICD-10-CM | POA: Diagnosis not present

## 2017-10-02 DIAGNOSIS — N186 End stage renal disease: Secondary | ICD-10-CM | POA: Diagnosis not present

## 2017-10-02 DIAGNOSIS — I129 Hypertensive chronic kidney disease with stage 1 through stage 4 chronic kidney disease, or unspecified chronic kidney disease: Secondary | ICD-10-CM | POA: Diagnosis not present

## 2017-10-02 DIAGNOSIS — Z992 Dependence on renal dialysis: Secondary | ICD-10-CM | POA: Diagnosis not present

## 2017-10-03 DIAGNOSIS — N2581 Secondary hyperparathyroidism of renal origin: Secondary | ICD-10-CM | POA: Diagnosis not present

## 2017-10-03 DIAGNOSIS — N186 End stage renal disease: Secondary | ICD-10-CM | POA: Diagnosis not present

## 2017-10-03 DIAGNOSIS — Z992 Dependence on renal dialysis: Secondary | ICD-10-CM | POA: Diagnosis not present

## 2017-10-03 DIAGNOSIS — D509 Iron deficiency anemia, unspecified: Secondary | ICD-10-CM | POA: Diagnosis not present

## 2017-10-05 DIAGNOSIS — Z992 Dependence on renal dialysis: Secondary | ICD-10-CM | POA: Diagnosis not present

## 2017-10-05 DIAGNOSIS — D509 Iron deficiency anemia, unspecified: Secondary | ICD-10-CM | POA: Diagnosis not present

## 2017-10-05 DIAGNOSIS — N186 End stage renal disease: Secondary | ICD-10-CM | POA: Diagnosis not present

## 2017-10-05 DIAGNOSIS — N2581 Secondary hyperparathyroidism of renal origin: Secondary | ICD-10-CM | POA: Diagnosis not present

## 2017-10-10 DIAGNOSIS — D509 Iron deficiency anemia, unspecified: Secondary | ICD-10-CM | POA: Diagnosis not present

## 2017-10-10 DIAGNOSIS — N186 End stage renal disease: Secondary | ICD-10-CM | POA: Diagnosis not present

## 2017-10-10 DIAGNOSIS — Z992 Dependence on renal dialysis: Secondary | ICD-10-CM | POA: Diagnosis not present

## 2017-10-10 DIAGNOSIS — N2581 Secondary hyperparathyroidism of renal origin: Secondary | ICD-10-CM | POA: Diagnosis not present

## 2017-10-12 DIAGNOSIS — Z992 Dependence on renal dialysis: Secondary | ICD-10-CM | POA: Diagnosis not present

## 2017-10-12 DIAGNOSIS — D509 Iron deficiency anemia, unspecified: Secondary | ICD-10-CM | POA: Diagnosis not present

## 2017-10-12 DIAGNOSIS — N2581 Secondary hyperparathyroidism of renal origin: Secondary | ICD-10-CM | POA: Diagnosis not present

## 2017-10-12 DIAGNOSIS — N186 End stage renal disease: Secondary | ICD-10-CM | POA: Diagnosis not present

## 2017-10-14 DIAGNOSIS — Z992 Dependence on renal dialysis: Secondary | ICD-10-CM | POA: Diagnosis not present

## 2017-10-14 DIAGNOSIS — N186 End stage renal disease: Secondary | ICD-10-CM | POA: Diagnosis not present

## 2017-10-14 DIAGNOSIS — D509 Iron deficiency anemia, unspecified: Secondary | ICD-10-CM | POA: Diagnosis not present

## 2017-10-14 DIAGNOSIS — N2581 Secondary hyperparathyroidism of renal origin: Secondary | ICD-10-CM | POA: Diagnosis not present

## 2017-10-17 ENCOUNTER — Encounter: Payer: Self-pay | Admitting: Nephrology

## 2017-10-17 DIAGNOSIS — D509 Iron deficiency anemia, unspecified: Secondary | ICD-10-CM | POA: Diagnosis not present

## 2017-10-17 DIAGNOSIS — Z992 Dependence on renal dialysis: Secondary | ICD-10-CM | POA: Diagnosis not present

## 2017-10-17 DIAGNOSIS — N2581 Secondary hyperparathyroidism of renal origin: Secondary | ICD-10-CM | POA: Diagnosis not present

## 2017-10-17 DIAGNOSIS — N186 End stage renal disease: Secondary | ICD-10-CM | POA: Diagnosis not present

## 2017-10-18 ENCOUNTER — Ambulatory Visit (INDEPENDENT_AMBULATORY_CARE_PROVIDER_SITE_OTHER): Payer: Medicare Other | Admitting: Physician Assistant

## 2017-10-19 ENCOUNTER — Encounter: Payer: Medicare Other | Admitting: Cardiothoracic Surgery

## 2017-10-21 DIAGNOSIS — N186 End stage renal disease: Secondary | ICD-10-CM | POA: Diagnosis not present

## 2017-10-21 DIAGNOSIS — Z992 Dependence on renal dialysis: Secondary | ICD-10-CM | POA: Diagnosis not present

## 2017-10-21 DIAGNOSIS — D509 Iron deficiency anemia, unspecified: Secondary | ICD-10-CM | POA: Diagnosis not present

## 2017-10-21 DIAGNOSIS — N2581 Secondary hyperparathyroidism of renal origin: Secondary | ICD-10-CM | POA: Diagnosis not present

## 2017-10-24 DIAGNOSIS — N2581 Secondary hyperparathyroidism of renal origin: Secondary | ICD-10-CM | POA: Diagnosis not present

## 2017-10-24 DIAGNOSIS — D509 Iron deficiency anemia, unspecified: Secondary | ICD-10-CM | POA: Diagnosis not present

## 2017-10-24 DIAGNOSIS — Z992 Dependence on renal dialysis: Secondary | ICD-10-CM | POA: Diagnosis not present

## 2017-10-24 DIAGNOSIS — N186 End stage renal disease: Secondary | ICD-10-CM | POA: Diagnosis not present

## 2017-10-25 ENCOUNTER — Encounter: Payer: Medicare Other | Admitting: Cardiothoracic Surgery

## 2017-10-26 DIAGNOSIS — N2581 Secondary hyperparathyroidism of renal origin: Secondary | ICD-10-CM | POA: Diagnosis not present

## 2017-10-26 DIAGNOSIS — D509 Iron deficiency anemia, unspecified: Secondary | ICD-10-CM | POA: Diagnosis not present

## 2017-10-26 DIAGNOSIS — I482 Chronic atrial fibrillation: Secondary | ICD-10-CM | POA: Diagnosis not present

## 2017-10-26 DIAGNOSIS — Z992 Dependence on renal dialysis: Secondary | ICD-10-CM | POA: Diagnosis not present

## 2017-10-26 DIAGNOSIS — N186 End stage renal disease: Secondary | ICD-10-CM | POA: Diagnosis not present

## 2017-10-28 DIAGNOSIS — D509 Iron deficiency anemia, unspecified: Secondary | ICD-10-CM | POA: Diagnosis not present

## 2017-10-28 DIAGNOSIS — N2581 Secondary hyperparathyroidism of renal origin: Secondary | ICD-10-CM | POA: Diagnosis not present

## 2017-10-28 DIAGNOSIS — Z992 Dependence on renal dialysis: Secondary | ICD-10-CM | POA: Diagnosis not present

## 2017-10-28 DIAGNOSIS — N186 End stage renal disease: Secondary | ICD-10-CM | POA: Diagnosis not present

## 2017-10-31 DIAGNOSIS — D509 Iron deficiency anemia, unspecified: Secondary | ICD-10-CM | POA: Diagnosis not present

## 2017-10-31 DIAGNOSIS — N186 End stage renal disease: Secondary | ICD-10-CM | POA: Diagnosis not present

## 2017-10-31 DIAGNOSIS — N2581 Secondary hyperparathyroidism of renal origin: Secondary | ICD-10-CM | POA: Diagnosis not present

## 2017-10-31 DIAGNOSIS — Z992 Dependence on renal dialysis: Secondary | ICD-10-CM | POA: Diagnosis not present

## 2017-11-02 DIAGNOSIS — N186 End stage renal disease: Secondary | ICD-10-CM | POA: Diagnosis not present

## 2017-11-02 DIAGNOSIS — I129 Hypertensive chronic kidney disease with stage 1 through stage 4 chronic kidney disease, or unspecified chronic kidney disease: Secondary | ICD-10-CM | POA: Diagnosis not present

## 2017-11-02 DIAGNOSIS — Z992 Dependence on renal dialysis: Secondary | ICD-10-CM | POA: Diagnosis not present

## 2017-11-04 DIAGNOSIS — N186 End stage renal disease: Secondary | ICD-10-CM | POA: Diagnosis not present

## 2017-11-04 DIAGNOSIS — D509 Iron deficiency anemia, unspecified: Secondary | ICD-10-CM | POA: Diagnosis not present

## 2017-11-04 DIAGNOSIS — Z992 Dependence on renal dialysis: Secondary | ICD-10-CM | POA: Diagnosis not present

## 2017-11-04 DIAGNOSIS — D631 Anemia in chronic kidney disease: Secondary | ICD-10-CM | POA: Diagnosis not present

## 2017-11-04 DIAGNOSIS — N2581 Secondary hyperparathyroidism of renal origin: Secondary | ICD-10-CM | POA: Diagnosis not present

## 2017-11-07 DIAGNOSIS — T8249XA Other complication of vascular dialysis catheter, initial encounter: Secondary | ICD-10-CM | POA: Diagnosis not present

## 2017-11-07 DIAGNOSIS — N186 End stage renal disease: Secondary | ICD-10-CM | POA: Diagnosis not present

## 2017-11-07 DIAGNOSIS — Z992 Dependence on renal dialysis: Secondary | ICD-10-CM | POA: Diagnosis not present

## 2017-11-07 DIAGNOSIS — D509 Iron deficiency anemia, unspecified: Secondary | ICD-10-CM | POA: Diagnosis not present

## 2017-11-07 DIAGNOSIS — D631 Anemia in chronic kidney disease: Secondary | ICD-10-CM | POA: Diagnosis not present

## 2017-11-07 DIAGNOSIS — N2581 Secondary hyperparathyroidism of renal origin: Secondary | ICD-10-CM | POA: Diagnosis not present

## 2017-11-09 DIAGNOSIS — N2581 Secondary hyperparathyroidism of renal origin: Secondary | ICD-10-CM | POA: Diagnosis not present

## 2017-11-09 DIAGNOSIS — D509 Iron deficiency anemia, unspecified: Secondary | ICD-10-CM | POA: Diagnosis not present

## 2017-11-09 DIAGNOSIS — Z992 Dependence on renal dialysis: Secondary | ICD-10-CM | POA: Diagnosis not present

## 2017-11-09 DIAGNOSIS — N186 End stage renal disease: Secondary | ICD-10-CM | POA: Diagnosis not present

## 2017-11-09 DIAGNOSIS — D631 Anemia in chronic kidney disease: Secondary | ICD-10-CM | POA: Diagnosis not present

## 2017-11-10 ENCOUNTER — Ambulatory Visit (INDEPENDENT_AMBULATORY_CARE_PROVIDER_SITE_OTHER): Payer: Medicare Other | Admitting: Physician Assistant

## 2017-11-11 DIAGNOSIS — N186 End stage renal disease: Secondary | ICD-10-CM | POA: Diagnosis not present

## 2017-11-11 DIAGNOSIS — N2581 Secondary hyperparathyroidism of renal origin: Secondary | ICD-10-CM | POA: Diagnosis not present

## 2017-11-11 DIAGNOSIS — D631 Anemia in chronic kidney disease: Secondary | ICD-10-CM | POA: Diagnosis not present

## 2017-11-11 DIAGNOSIS — Z992 Dependence on renal dialysis: Secondary | ICD-10-CM | POA: Diagnosis not present

## 2017-11-11 DIAGNOSIS — D509 Iron deficiency anemia, unspecified: Secondary | ICD-10-CM | POA: Diagnosis not present

## 2017-11-14 DIAGNOSIS — Z992 Dependence on renal dialysis: Secondary | ICD-10-CM | POA: Diagnosis not present

## 2017-11-14 DIAGNOSIS — N2581 Secondary hyperparathyroidism of renal origin: Secondary | ICD-10-CM | POA: Diagnosis not present

## 2017-11-14 DIAGNOSIS — D509 Iron deficiency anemia, unspecified: Secondary | ICD-10-CM | POA: Diagnosis not present

## 2017-11-14 DIAGNOSIS — D631 Anemia in chronic kidney disease: Secondary | ICD-10-CM | POA: Diagnosis not present

## 2017-11-14 DIAGNOSIS — N186 End stage renal disease: Secondary | ICD-10-CM | POA: Diagnosis not present

## 2017-11-16 DIAGNOSIS — N2581 Secondary hyperparathyroidism of renal origin: Secondary | ICD-10-CM | POA: Diagnosis not present

## 2017-11-16 DIAGNOSIS — N186 End stage renal disease: Secondary | ICD-10-CM | POA: Diagnosis not present

## 2017-11-16 DIAGNOSIS — D509 Iron deficiency anemia, unspecified: Secondary | ICD-10-CM | POA: Diagnosis not present

## 2017-11-16 DIAGNOSIS — Z992 Dependence on renal dialysis: Secondary | ICD-10-CM | POA: Diagnosis not present

## 2017-11-16 DIAGNOSIS — D631 Anemia in chronic kidney disease: Secondary | ICD-10-CM | POA: Diagnosis not present

## 2017-11-18 DIAGNOSIS — N186 End stage renal disease: Secondary | ICD-10-CM | POA: Diagnosis not present

## 2017-11-18 DIAGNOSIS — N2581 Secondary hyperparathyroidism of renal origin: Secondary | ICD-10-CM | POA: Diagnosis not present

## 2017-11-18 DIAGNOSIS — Z992 Dependence on renal dialysis: Secondary | ICD-10-CM | POA: Diagnosis not present

## 2017-11-18 DIAGNOSIS — D509 Iron deficiency anemia, unspecified: Secondary | ICD-10-CM | POA: Diagnosis not present

## 2017-11-18 DIAGNOSIS — D631 Anemia in chronic kidney disease: Secondary | ICD-10-CM | POA: Diagnosis not present

## 2017-11-23 DIAGNOSIS — N186 End stage renal disease: Secondary | ICD-10-CM | POA: Diagnosis not present

## 2017-11-23 DIAGNOSIS — Z992 Dependence on renal dialysis: Secondary | ICD-10-CM | POA: Diagnosis not present

## 2017-11-23 DIAGNOSIS — D509 Iron deficiency anemia, unspecified: Secondary | ICD-10-CM | POA: Diagnosis not present

## 2017-11-23 DIAGNOSIS — D631 Anemia in chronic kidney disease: Secondary | ICD-10-CM | POA: Diagnosis not present

## 2017-11-23 DIAGNOSIS — N2581 Secondary hyperparathyroidism of renal origin: Secondary | ICD-10-CM | POA: Diagnosis not present

## 2017-11-25 DIAGNOSIS — N186 End stage renal disease: Secondary | ICD-10-CM | POA: Diagnosis not present

## 2017-11-25 DIAGNOSIS — Z992 Dependence on renal dialysis: Secondary | ICD-10-CM | POA: Diagnosis not present

## 2017-11-25 DIAGNOSIS — D509 Iron deficiency anemia, unspecified: Secondary | ICD-10-CM | POA: Diagnosis not present

## 2017-11-25 DIAGNOSIS — N2581 Secondary hyperparathyroidism of renal origin: Secondary | ICD-10-CM | POA: Diagnosis not present

## 2017-11-25 DIAGNOSIS — D631 Anemia in chronic kidney disease: Secondary | ICD-10-CM | POA: Diagnosis not present

## 2017-11-28 DIAGNOSIS — D631 Anemia in chronic kidney disease: Secondary | ICD-10-CM | POA: Diagnosis not present

## 2017-11-28 DIAGNOSIS — N186 End stage renal disease: Secondary | ICD-10-CM | POA: Diagnosis not present

## 2017-11-28 DIAGNOSIS — D509 Iron deficiency anemia, unspecified: Secondary | ICD-10-CM | POA: Diagnosis not present

## 2017-11-28 DIAGNOSIS — Z992 Dependence on renal dialysis: Secondary | ICD-10-CM | POA: Diagnosis not present

## 2017-11-28 DIAGNOSIS — N2581 Secondary hyperparathyroidism of renal origin: Secondary | ICD-10-CM | POA: Diagnosis not present

## 2017-12-02 DIAGNOSIS — I482 Chronic atrial fibrillation: Secondary | ICD-10-CM | POA: Diagnosis not present

## 2017-12-02 DIAGNOSIS — N2581 Secondary hyperparathyroidism of renal origin: Secondary | ICD-10-CM | POA: Diagnosis not present

## 2017-12-02 DIAGNOSIS — N186 End stage renal disease: Secondary | ICD-10-CM | POA: Diagnosis not present

## 2017-12-02 DIAGNOSIS — D631 Anemia in chronic kidney disease: Secondary | ICD-10-CM | POA: Diagnosis not present

## 2017-12-02 DIAGNOSIS — D509 Iron deficiency anemia, unspecified: Secondary | ICD-10-CM | POA: Diagnosis not present

## 2017-12-02 DIAGNOSIS — Z992 Dependence on renal dialysis: Secondary | ICD-10-CM | POA: Diagnosis not present

## 2017-12-03 DIAGNOSIS — I129 Hypertensive chronic kidney disease with stage 1 through stage 4 chronic kidney disease, or unspecified chronic kidney disease: Secondary | ICD-10-CM | POA: Diagnosis not present

## 2017-12-03 DIAGNOSIS — Z992 Dependence on renal dialysis: Secondary | ICD-10-CM | POA: Diagnosis not present

## 2017-12-03 DIAGNOSIS — N186 End stage renal disease: Secondary | ICD-10-CM | POA: Diagnosis not present

## 2017-12-05 DIAGNOSIS — N186 End stage renal disease: Secondary | ICD-10-CM | POA: Diagnosis not present

## 2017-12-05 DIAGNOSIS — Z992 Dependence on renal dialysis: Secondary | ICD-10-CM | POA: Diagnosis not present

## 2017-12-05 DIAGNOSIS — N2581 Secondary hyperparathyroidism of renal origin: Secondary | ICD-10-CM | POA: Diagnosis not present

## 2017-12-05 DIAGNOSIS — D509 Iron deficiency anemia, unspecified: Secondary | ICD-10-CM | POA: Diagnosis not present

## 2017-12-09 DIAGNOSIS — Z992 Dependence on renal dialysis: Secondary | ICD-10-CM | POA: Diagnosis not present

## 2017-12-09 DIAGNOSIS — N186 End stage renal disease: Secondary | ICD-10-CM | POA: Diagnosis not present

## 2017-12-09 DIAGNOSIS — N2581 Secondary hyperparathyroidism of renal origin: Secondary | ICD-10-CM | POA: Diagnosis not present

## 2017-12-09 DIAGNOSIS — D509 Iron deficiency anemia, unspecified: Secondary | ICD-10-CM | POA: Diagnosis not present

## 2017-12-12 DIAGNOSIS — N186 End stage renal disease: Secondary | ICD-10-CM | POA: Diagnosis not present

## 2017-12-12 DIAGNOSIS — N2581 Secondary hyperparathyroidism of renal origin: Secondary | ICD-10-CM | POA: Diagnosis not present

## 2017-12-12 DIAGNOSIS — D509 Iron deficiency anemia, unspecified: Secondary | ICD-10-CM | POA: Diagnosis not present

## 2017-12-12 DIAGNOSIS — Z992 Dependence on renal dialysis: Secondary | ICD-10-CM | POA: Diagnosis not present

## 2017-12-16 DIAGNOSIS — D509 Iron deficiency anemia, unspecified: Secondary | ICD-10-CM | POA: Diagnosis not present

## 2017-12-16 DIAGNOSIS — N186 End stage renal disease: Secondary | ICD-10-CM | POA: Diagnosis not present

## 2017-12-16 DIAGNOSIS — Z992 Dependence on renal dialysis: Secondary | ICD-10-CM | POA: Diagnosis not present

## 2017-12-16 DIAGNOSIS — N2581 Secondary hyperparathyroidism of renal origin: Secondary | ICD-10-CM | POA: Diagnosis not present

## 2017-12-19 DIAGNOSIS — N2581 Secondary hyperparathyroidism of renal origin: Secondary | ICD-10-CM | POA: Diagnosis not present

## 2017-12-19 DIAGNOSIS — N186 End stage renal disease: Secondary | ICD-10-CM | POA: Diagnosis not present

## 2017-12-19 DIAGNOSIS — D509 Iron deficiency anemia, unspecified: Secondary | ICD-10-CM | POA: Diagnosis not present

## 2017-12-19 DIAGNOSIS — Z992 Dependence on renal dialysis: Secondary | ICD-10-CM | POA: Diagnosis not present

## 2017-12-21 DIAGNOSIS — N186 End stage renal disease: Secondary | ICD-10-CM | POA: Diagnosis not present

## 2017-12-21 DIAGNOSIS — N2581 Secondary hyperparathyroidism of renal origin: Secondary | ICD-10-CM | POA: Diagnosis not present

## 2017-12-21 DIAGNOSIS — Z992 Dependence on renal dialysis: Secondary | ICD-10-CM | POA: Diagnosis not present

## 2017-12-21 DIAGNOSIS — D509 Iron deficiency anemia, unspecified: Secondary | ICD-10-CM | POA: Diagnosis not present

## 2017-12-23 DIAGNOSIS — Z992 Dependence on renal dialysis: Secondary | ICD-10-CM | POA: Diagnosis not present

## 2017-12-23 DIAGNOSIS — D509 Iron deficiency anemia, unspecified: Secondary | ICD-10-CM | POA: Diagnosis not present

## 2017-12-23 DIAGNOSIS — N186 End stage renal disease: Secondary | ICD-10-CM | POA: Diagnosis not present

## 2017-12-23 DIAGNOSIS — N2581 Secondary hyperparathyroidism of renal origin: Secondary | ICD-10-CM | POA: Diagnosis not present

## 2017-12-26 DIAGNOSIS — D509 Iron deficiency anemia, unspecified: Secondary | ICD-10-CM | POA: Diagnosis not present

## 2017-12-26 DIAGNOSIS — N2581 Secondary hyperparathyroidism of renal origin: Secondary | ICD-10-CM | POA: Diagnosis not present

## 2017-12-26 DIAGNOSIS — Z992 Dependence on renal dialysis: Secondary | ICD-10-CM | POA: Diagnosis not present

## 2017-12-26 DIAGNOSIS — N186 End stage renal disease: Secondary | ICD-10-CM | POA: Diagnosis not present

## 2017-12-30 DIAGNOSIS — Z992 Dependence on renal dialysis: Secondary | ICD-10-CM | POA: Diagnosis not present

## 2017-12-30 DIAGNOSIS — N2581 Secondary hyperparathyroidism of renal origin: Secondary | ICD-10-CM | POA: Diagnosis not present

## 2017-12-30 DIAGNOSIS — N186 End stage renal disease: Secondary | ICD-10-CM | POA: Diagnosis not present

## 2017-12-30 DIAGNOSIS — D509 Iron deficiency anemia, unspecified: Secondary | ICD-10-CM | POA: Diagnosis not present

## 2017-12-30 DIAGNOSIS — I482 Chronic atrial fibrillation: Secondary | ICD-10-CM | POA: Diagnosis not present

## 2018-01-01 ENCOUNTER — Ambulatory Visit (INDEPENDENT_AMBULATORY_CARE_PROVIDER_SITE_OTHER): Payer: Medicare Other | Admitting: *Deleted

## 2018-01-01 DIAGNOSIS — Z7901 Long term (current) use of anticoagulants: Secondary | ICD-10-CM

## 2018-01-01 DIAGNOSIS — I4891 Unspecified atrial fibrillation: Secondary | ICD-10-CM | POA: Diagnosis not present

## 2018-01-01 LAB — POCT INR: INR: 1.7 — AB (ref 2.0–3.0)

## 2018-01-01 NOTE — Patient Instructions (Signed)
Description   Today take 1 and 1/2 tablet then continue taking 1 and 1/2 tablets daily except 1 tablet on Mondays and Fridays. Recheck INR in 3 weeks. Continue eating dark green leafy veggies 2-3 times a week.

## 2018-01-02 DIAGNOSIS — Z992 Dependence on renal dialysis: Secondary | ICD-10-CM | POA: Diagnosis not present

## 2018-01-02 DIAGNOSIS — I129 Hypertensive chronic kidney disease with stage 1 through stage 4 chronic kidney disease, or unspecified chronic kidney disease: Secondary | ICD-10-CM | POA: Diagnosis not present

## 2018-01-02 DIAGNOSIS — D509 Iron deficiency anemia, unspecified: Secondary | ICD-10-CM | POA: Diagnosis not present

## 2018-01-02 DIAGNOSIS — N2581 Secondary hyperparathyroidism of renal origin: Secondary | ICD-10-CM | POA: Diagnosis not present

## 2018-01-02 DIAGNOSIS — N186 End stage renal disease: Secondary | ICD-10-CM | POA: Diagnosis not present

## 2018-01-02 DIAGNOSIS — D631 Anemia in chronic kidney disease: Secondary | ICD-10-CM | POA: Diagnosis not present

## 2018-01-02 DIAGNOSIS — Z23 Encounter for immunization: Secondary | ICD-10-CM | POA: Diagnosis not present

## 2018-01-06 DIAGNOSIS — N186 End stage renal disease: Secondary | ICD-10-CM | POA: Diagnosis not present

## 2018-01-06 DIAGNOSIS — Z23 Encounter for immunization: Secondary | ICD-10-CM | POA: Diagnosis not present

## 2018-01-06 DIAGNOSIS — D631 Anemia in chronic kidney disease: Secondary | ICD-10-CM | POA: Diagnosis not present

## 2018-01-06 DIAGNOSIS — D509 Iron deficiency anemia, unspecified: Secondary | ICD-10-CM | POA: Diagnosis not present

## 2018-01-06 DIAGNOSIS — Z992 Dependence on renal dialysis: Secondary | ICD-10-CM | POA: Diagnosis not present

## 2018-01-06 DIAGNOSIS — N2581 Secondary hyperparathyroidism of renal origin: Secondary | ICD-10-CM | POA: Diagnosis not present

## 2018-01-09 DIAGNOSIS — D631 Anemia in chronic kidney disease: Secondary | ICD-10-CM | POA: Diagnosis not present

## 2018-01-09 DIAGNOSIS — N2581 Secondary hyperparathyroidism of renal origin: Secondary | ICD-10-CM | POA: Diagnosis not present

## 2018-01-09 DIAGNOSIS — N186 End stage renal disease: Secondary | ICD-10-CM | POA: Diagnosis not present

## 2018-01-09 DIAGNOSIS — Z23 Encounter for immunization: Secondary | ICD-10-CM | POA: Diagnosis not present

## 2018-01-09 DIAGNOSIS — D509 Iron deficiency anemia, unspecified: Secondary | ICD-10-CM | POA: Diagnosis not present

## 2018-01-09 DIAGNOSIS — Z992 Dependence on renal dialysis: Secondary | ICD-10-CM | POA: Diagnosis not present

## 2018-01-11 DIAGNOSIS — D509 Iron deficiency anemia, unspecified: Secondary | ICD-10-CM | POA: Diagnosis not present

## 2018-01-11 DIAGNOSIS — N186 End stage renal disease: Secondary | ICD-10-CM | POA: Diagnosis not present

## 2018-01-11 DIAGNOSIS — D631 Anemia in chronic kidney disease: Secondary | ICD-10-CM | POA: Diagnosis not present

## 2018-01-11 DIAGNOSIS — N2581 Secondary hyperparathyroidism of renal origin: Secondary | ICD-10-CM | POA: Diagnosis not present

## 2018-01-11 DIAGNOSIS — Z992 Dependence on renal dialysis: Secondary | ICD-10-CM | POA: Diagnosis not present

## 2018-01-11 DIAGNOSIS — Z23 Encounter for immunization: Secondary | ICD-10-CM | POA: Diagnosis not present

## 2018-01-13 DIAGNOSIS — N2581 Secondary hyperparathyroidism of renal origin: Secondary | ICD-10-CM | POA: Diagnosis not present

## 2018-01-13 DIAGNOSIS — D631 Anemia in chronic kidney disease: Secondary | ICD-10-CM | POA: Diagnosis not present

## 2018-01-13 DIAGNOSIS — Z23 Encounter for immunization: Secondary | ICD-10-CM | POA: Diagnosis not present

## 2018-01-13 DIAGNOSIS — N186 End stage renal disease: Secondary | ICD-10-CM | POA: Diagnosis not present

## 2018-01-13 DIAGNOSIS — Z992 Dependence on renal dialysis: Secondary | ICD-10-CM | POA: Diagnosis not present

## 2018-01-13 DIAGNOSIS — D509 Iron deficiency anemia, unspecified: Secondary | ICD-10-CM | POA: Diagnosis not present

## 2018-01-15 DIAGNOSIS — H2513 Age-related nuclear cataract, bilateral: Secondary | ICD-10-CM | POA: Diagnosis not present

## 2018-01-15 DIAGNOSIS — H524 Presbyopia: Secondary | ICD-10-CM | POA: Diagnosis not present

## 2018-01-15 DIAGNOSIS — H5213 Myopia, bilateral: Secondary | ICD-10-CM | POA: Diagnosis not present

## 2018-01-15 DIAGNOSIS — H353132 Nonexudative age-related macular degeneration, bilateral, intermediate dry stage: Secondary | ICD-10-CM | POA: Diagnosis not present

## 2018-01-16 DIAGNOSIS — Z992 Dependence on renal dialysis: Secondary | ICD-10-CM | POA: Diagnosis not present

## 2018-01-16 DIAGNOSIS — N2581 Secondary hyperparathyroidism of renal origin: Secondary | ICD-10-CM | POA: Diagnosis not present

## 2018-01-16 DIAGNOSIS — Z23 Encounter for immunization: Secondary | ICD-10-CM | POA: Diagnosis not present

## 2018-01-16 DIAGNOSIS — N186 End stage renal disease: Secondary | ICD-10-CM | POA: Diagnosis not present

## 2018-01-16 DIAGNOSIS — D509 Iron deficiency anemia, unspecified: Secondary | ICD-10-CM | POA: Diagnosis not present

## 2018-01-16 DIAGNOSIS — D631 Anemia in chronic kidney disease: Secondary | ICD-10-CM | POA: Diagnosis not present

## 2018-01-18 NOTE — Progress Notes (Addendum)
[facility] Note  01/19/2018     CHIEF COMPLAINT Patient presents for Retina Evaluation   HISTORY OF PRESENT ILLNESS: Corey [name] is a 47 y.o. male who presents to the clinic today for:   HPI    Retina Evaluation    In both eyes.  This started 3.  Associated Symptoms Photophobia, Flashes and Floaters.  Negative for Pain, Scalp Tenderness, Fever, Distortion, Redness, Trauma, Shoulder/Hip pain, Fatigue, Weight Loss, Jaw Claudication, Glare and Blind Spot.  Context:  distance vision, mid-range vision and near vision.  Treatments tried include no treatments.  I, the attending physician,  performed the HPI with the patient and updated documentation appropriately.          Comments    Referral of Dr. Gershon Crane for Retina Eval./ARMD OU. Patient states in 2016 he was Dx with Stargardt, he has blurry VA ,light sensitivity OU and occasional HA'S. Os seems somewhat worse. Denies ocular pain.Takes Multivitamins. Denies gtt's         Last edited by Bernarda Caffey, MD on 01/19/2018  9:32 AM. (History)    No family history of Stargardts. Was diagnosed in 2016 by Dr. Encarnacion Slates in [place], [place] ([facility]) Has not been genetically tested. History of aortic dissection in 2004 -- was paralyzed from waist down temporarily, which led to ESRD and currently on dialysis.  Referring physician: Rutherford Guys, [place], [place] [address]  HISTORICAL INFORMATION:   Selected notes from the MEDICAL RECORD NUMBER Referred by Dr. Rutherford Guys for concern of ARMD OU LEE: Jake Samples) [BCVA: OD: 20/200 OS: 20/200] Ocular Hx-[place] PMH-Afib, ESRD, HTN    CURRENT MEDICATIONS: No current outpatient medications on file. (Ophthalmic Drugs)   No current facility-administered medications for this visit.  (Ophthalmic Drugs)   Current Outpatient Medications (Other)  Medication Sig  . cinacalcet (SENSIPAR) 30 MG tablet Take 2 tablets (60 mg total) by mouth  daily with supper.  . labetalol (NORMODYNE) 100 MG tablet Take 1 tablet (100 mg total) by mouth 2 (two) times daily.  . sevelamer carbonate (RENVELA) 0.8 g PACK packet Take 1.6 g by mouth 3 (three) times daily with meals.  . warfarin (COUMADIN) 5 MG tablet Take as directed by coumadin clinic   No current facility-administered medications for this visit.  (Other)      REVIEW OF SYSTEMS: ROS    Positive for: Musculoskeletal, Eyes   Negative for: Constitutional, Gastrointestinal, Neurological, Skin, Genitourinary, HENT, Endocrine, Cardiovascular, Respiratory, Psychiatric, Allergic/Imm, Heme/Lymph   Last edited by Zenovia Jordan, LPN on 60/73/7106  2:69 AM. (History)       ALLERGIES Allergies  Allergen Reactions  . Ciprofloxacin Other (See Comments)    Aortic dissection  . Heparin Other (See Comments)    UNSPECIFIED REACTION :  On Coumadin since 2004   HIT panel negative 01/19/17    PAST MEDICAL HISTORY Past Medical History:  Diagnosis Date  . A-fib ([place])   . Arthritis    hands and shoulders  . Blindness and low vision    "Stargardt disease"  . Dissection of aorta ([place]) 2004  . Dysrhythmia    A-fib  . ESRD (end stage renal disease) ([place])   . Headache   . History of cardioversion 2014  . Hypertension   . Neuropathy   . Non-healing non-surgical wound 03/2016  . Paralysis ([place])    due to dissection of aorta in 2004, lower extremities  . Pneumonia    Past Surgical History:  Procedure Laterality Date  . APPLICATION OF WOUND VAC Left 04/13/2016   Procedure: APPLICATION OF WOUND VAC;  Surgeon: Conrad Benns Church, MD;  Location: [facility];  Service: Vascular;  Laterality: Left;  . APPLICATION OF WOUND VAC Left 04/18/2016   Procedure: APPLICATION OF WOUND VAC;  Surgeon: Waynetta Sandy, MD;  Location: [facility];  Service: Vascular;  Laterality: Left;  Wound vac change   . APPLICATION OF WOUND VAC Left 04/20/2016   Procedure: WOUND VAC CHANGE;  Surgeon: Conrad Thunderbolt, MD;   Location: [facility];  Service: Vascular;  Laterality: Left;  . AV FISTULA PLACEMENT    . BASCILIC VEIN TRANSPOSITION Left 12/09/2015   Procedure: FIRST STAGE BASILIC VEIN TRANSPOSITION LEFT UPPER ARM;  Surgeon: Conrad San Leon, MD;  Location: [facility];  Service: Vascular;  Laterality: Left;  . BASCILIC VEIN TRANSPOSITION Left 03/09/2016   Procedure: SECOND STAGE BASILIC VEIN TRANSPOSITION WITH REVISION OF ANASTOMOSIS LEFT UPPER ARM;  Surgeon: Conrad Parkman, MD;  Location: [facility];  Service: Vascular;  Laterality: Left;  . CARDIOVERSION    . REPAIR OF ACUTE ASCENDING THORACIC AORTIC DISSECTION    . REVISON OF ARTERIOVENOUS FISTULA Left 04/20/2016   Procedure: LIGATION OF BASILIC VEIN TRANSPOSITION;  Surgeon: Conrad [place], MD;  Location: [facility];  Service: Vascular;  Laterality: Left;  . WOUND DEBRIDEMENT Left 04/13/2016   Procedure: DEBRIDEMENT WOUND;  Surgeon: Conrad Avenel, MD;  Location[facility];  Service: Vascular;  Laterality: Left;    FAMILY HISTORY Family History  Problem Relation Age of Onset  . Cancer Mother   . Hypertension Mother   . Cancer Father   . Hypertension Father   . Thyroid disease Sister   . Stroke Brother        40    SOCIAL HISTORY Social History   Tobacco Use  . Smoking status: Never Smoker  . Smokeless tobacco: Never Used  Substance Use Topics  . Alcohol use: No  . Drug use: No         OPHTHALMIC EXAM:  Base Eye Exam    Visual Acuity (Snellen - Linear)      Right Left   Dist cc 20/150 20/150   Dist ph cc 20/150 +2 NI   Correction:  Glasses       Tonometry (Tonopen, 8:20 AM)      Right Left   Pressure 18 18       Pupils      Dark Light Shape React APD   Right 4 3 Round Slow None   Left 4 3 Round Slow None       Visual Fields (Counting fingers)      Left Right    Full Full       Extraocular Movement      Right Left    Full, Ortho Full, Ortho       Neuro/Psych    Oriented x3:  Yes       Dilation    Both eyes:  1.0% Mydriacyl, 2.5%  Phenylephrine @ 8:21 AM        Slit Lamp and Fundus Exam    External Exam      Right Left   External Normal Normal       Slit Lamp Exam      Right Left   Lids/Lashes mild MGD mild MGD   Conjunctiva/Sclera pinguecula; melanosis pinguecula; melanosis   Cornea arcus arcus   Anterior Chamber deep deep   Iris Round, dilated  Round, dilated   Lens 2+ NSC, CC 2+ NSC, CC   Vitreous syneresis syneresis       Fundus Exam      Right Left   Disc pink, sharp rim; pink, sharp rim;   C/D Ratio 0.3 0.3   Macula central atrophy with beaten bronze appearance and pigment  clumping central atrophy with beaten bronze appearance and pigment  clumping   Vessels Normal Normal   Periphery attached; pisciform flecks posteriorly; scattered RPE changes; single IRH inf temp quad attached; pisciform flecks posteriorly; scattered RPE changes          IMAGING AND PROCEDURES  Imaging and Procedures for @TODAY @  OCT, Retina - OU - Both Eyes       Right Eye Quality was good. Central Foveal Thickness: 128. Findings include normal foveal contour, no IRF, no SRF, outer retinal atrophy.   Left Eye Central Foveal Thickness: 162. Findings include normal foveal contour, no IRF, no SRF, outer retinal atrophy.   Notes *Images captured and stored on drive  Diagnosis / Impression:  NFP; no IRF/SRF -- severe central ORA  Clinical management:  See below  Abbreviations: NFP - Normal foveal profile. CME - cystoid macular edema. PED - pigment epithelial detachment. IRF - intraretinal fluid. SRF - subretinal fluid. EZ - ellipsoid zone. ERM - epiretinal membrane. ORA - outer retinal atrophy. ORT - outer retinal tubulation. SRHM - subretinal hyper-reflective material        Fluorescein Angiography [place] (Transit OS)       Right Eye   Progression has no prior data. Early phase findings include staining, window defect (Dark choroid). Mid/Late phase findings include staining, window defect.   Left  Eye   Progression has no prior data. Early phase findings include staining, window defect (Dark choroid). Mid/Late phase findings include staining, window defect.   Notes Images stored on drive;   Impression: Dark choroid OU Central window defect OU (OS > OD) Hyperfluorescent pisciform flecks in posterior pole OU Stargardt's Disease OU        ICG Angiography [place] (Transit OS)       Right Eye Early phase findings include window defect, hypofluorescence (Patchy choroidal filling). Mid/Late phase findings include window defect, hypofluorescence (hypofluorescent flecks posterior pole).   Left Eye Early phase findings include hypofluorescence, window defect (Patchy choroidal filling). Mid/Late phase findings include window defect, hypofluorescence (hypofluorescent flecks posterior pole).                 ASSESSMENT/PLAN:    ICD-10-CM   1. Stargardt's disease H35.53 Fluorescein Angiography [place] (Transit OS)    ICG Angiography [place] (Transit OS)  2. Retinal edema H35.81 OCT, Retina - OU - Both Eyes  3. Essential hypertension I10   4. Hypertensive retinopathy of both eyes H35.033 Fluorescein Angiography [place] (Transit OS)    ICG Angiography [place] (Transit OS)  5. Combined forms of age-related cataract of both eyes H25.813     1. Stargardt's Disease - originally diagnosed clinically in 2016 by Dr. Encarnacion Slates in [place], [place] ([facility])  - no family history of eye disease, no genetic testing done - macula with beaten bronze appearance and central ORA - posterior pole with pisciform flecks - FA with dark choroid OU and hyperfluorescent flecks in posterior pole - ICG also shows patchy filling - BCVA 20/150 OU - discussed findings, prognosis and limited treatment options, but possible clinical trials - discussed possible referral to specialsts in inherited/genetic retinal diseases - will refer to [facility]  for the  [place] Office  Phone: [phone]  Toll Free: [phone]  Fax: [phone]  Email: [email or web address]  Physical Address  [address] [place], [place] [address] - f/u in 3 mos -- DFE/OCT/?repeat FA/ICG  2. No retinal edema on exam or OCT  3,4. Hypertensive retinopathy OU - discussed importance of tight BP control - monitor - pt with ESRD on dialysis following aortic dissection  5. Cataracts OU - The symptoms of cataract, surgical options, and treatments and risks were discussed with patient. - discussed diagnosis and progression - under the expert management of Dr. Gershon Crane - clear from a retina standpoint to proceed with cataract surgery when pt and surgeon are ready, but discussed with pt that visual improvement from cataract surgery will likely be limited by his retinal disease   Ophthalmic Meds Ordered this visit:  No orders of the defined types were placed in this encounter.      Return in about 3 months (around 04/21/2018) for Dilated Exam, OCT, Fluorescein Angiogram.  There are no Patient Instructions on file for this visit.   Explained the diagnoses, plan, and follow up with the patient and they expressed understanding.  Patient expressed understanding of the importance of proper follow up care.   This document serves as a record of services personally performed by Gardiner Sleeper, MD, PhD. It was created on their behalf by Ernest Mallick, OA, an ophthalmic assistant. The creation of this record is the provider's dictation and/or activities during the visit.    Electronically signed by: Ernest Mallick, OA  10.17.19 6:01 PM    Gardiner Sleeper, M.D., Ph.D. Diseases & Surgery of the Retina and Vitreous [facility] [employer]   I have reviewed the above documentation for accuracy and completeness, and I agree with the above. Gardiner Sleeper, M.D., Ph.D. 01/21/18 6:01 PM     Abbreviations: M myopia  (nearsighted); A astigmatism; H hyperopia (farsighted); P presbyopia; Mrx spectacle prescription;  CTL contact lenses; OD right eye; OS left eye; OU both eyes  XT exotropia; ET esotropia; PEK punctate epithelial keratitis; PEE punctate epithelial erosions; DES dry eye syndrome; MGD meibomian gland dysfunction; ATs artificial tears; PFAT's preservative free artificial tears; [place] nuclear sclerotic cataract; PSC posterior subcapsular cataract; ERM epi-retinal membrane; PVD posterior vitreous detachment; RD retinal detachment; DM diabetes mellitus; DR diabetic retinopathy; NPDR non-proliferative diabetic retinopathy; PDR proliferative diabetic retinopathy; CSME clinically significant macular edema; DME diabetic macular edema; dbh dot blot hemorrhages; CWS cotton wool spot; POAG primary open angle glaucoma; C/D cup-to-disc ratio; HVF humphrey visual field; GVF goldmann visual field; OCT optical coherence tomography; IOP intraocular pressure; BRVO Branch retinal vein occlusion; CRVO central retinal vein occlusion; CRAO central retinal artery occlusion; BRAO branch retinal artery occlusion; RT retinal tear; SB scleral buckle; PPV pars plana vitrectomy; VH Vitreous hemorrhage; PRP panretinal laser photocoagulation; IVK intravitreal kenalog; VMT vitreomacular traction; MH Macular hole;  NVD neovascularization of the disc; NVE neovascularization elsewhere; AREDS age related eye disease study; ARMD age related macular degeneration; POAG primary open angle glaucoma; EBMD epithelial/anterior basement membrane dystrophy; ACIOL anterior chamber intraocular lens; IOL intraocular lens; PCIOL posterior chamber intraocular lens; Phaco/IOL phacoemulsification with intraocular lens placement; [place] photorefractive keratectomy; LASIK laser assisted in situ keratomileusis; HTN hypertension; DM diabetes mellitus; COPD chronic obstructive pulmonary disease

## 2018-01-19 ENCOUNTER — Ambulatory Visit (INDEPENDENT_AMBULATORY_CARE_PROVIDER_SITE_OTHER): Payer: Medicare Other | Admitting: Ophthalmology

## 2018-01-19 ENCOUNTER — Encounter (INDEPENDENT_AMBULATORY_CARE_PROVIDER_SITE_OTHER): Payer: Self-pay | Admitting: Ophthalmology

## 2018-01-19 DIAGNOSIS — I10 Essential (primary) hypertension: Secondary | ICD-10-CM | POA: Diagnosis not present

## 2018-01-19 DIAGNOSIS — H3553 Other dystrophies primarily involving the sensory retina: Secondary | ICD-10-CM | POA: Diagnosis not present

## 2018-01-19 DIAGNOSIS — H3581 Retinal edema: Secondary | ICD-10-CM | POA: Diagnosis not present

## 2018-01-19 DIAGNOSIS — H35033 Hypertensive retinopathy, bilateral: Secondary | ICD-10-CM

## 2018-01-19 DIAGNOSIS — H25813 Combined forms of age-related cataract, bilateral: Secondary | ICD-10-CM

## 2018-01-20 DIAGNOSIS — N2581 Secondary hyperparathyroidism of renal origin: Secondary | ICD-10-CM | POA: Diagnosis not present

## 2018-01-20 DIAGNOSIS — D631 Anemia in chronic kidney disease: Secondary | ICD-10-CM | POA: Diagnosis not present

## 2018-01-20 DIAGNOSIS — Z992 Dependence on renal dialysis: Secondary | ICD-10-CM | POA: Diagnosis not present

## 2018-01-20 DIAGNOSIS — Z23 Encounter for immunization: Secondary | ICD-10-CM | POA: Diagnosis not present

## 2018-01-20 DIAGNOSIS — N186 End stage renal disease: Secondary | ICD-10-CM | POA: Diagnosis not present

## 2018-01-20 DIAGNOSIS — D509 Iron deficiency anemia, unspecified: Secondary | ICD-10-CM | POA: Diagnosis not present

## 2018-01-21 ENCOUNTER — Encounter (INDEPENDENT_AMBULATORY_CARE_PROVIDER_SITE_OTHER): Payer: Self-pay | Admitting: Ophthalmology

## 2018-01-25 DIAGNOSIS — N2581 Secondary hyperparathyroidism of renal origin: Secondary | ICD-10-CM | POA: Diagnosis not present

## 2018-01-25 DIAGNOSIS — N186 End stage renal disease: Secondary | ICD-10-CM | POA: Diagnosis not present

## 2018-01-25 DIAGNOSIS — Z992 Dependence on renal dialysis: Secondary | ICD-10-CM | POA: Diagnosis not present

## 2018-01-25 DIAGNOSIS — D509 Iron deficiency anemia, unspecified: Secondary | ICD-10-CM | POA: Diagnosis not present

## 2018-01-25 DIAGNOSIS — Z23 Encounter for immunization: Secondary | ICD-10-CM | POA: Diagnosis not present

## 2018-01-25 DIAGNOSIS — D631 Anemia in chronic kidney disease: Secondary | ICD-10-CM | POA: Diagnosis not present

## 2018-01-25 DIAGNOSIS — I482 Chronic atrial fibrillation, unspecified: Secondary | ICD-10-CM | POA: Diagnosis not present

## 2018-01-27 DIAGNOSIS — Z23 Encounter for immunization: Secondary | ICD-10-CM | POA: Diagnosis not present

## 2018-01-27 DIAGNOSIS — D509 Iron deficiency anemia, unspecified: Secondary | ICD-10-CM | POA: Diagnosis not present

## 2018-01-27 DIAGNOSIS — D631 Anemia in chronic kidney disease: Secondary | ICD-10-CM | POA: Diagnosis not present

## 2018-01-27 DIAGNOSIS — N186 End stage renal disease: Secondary | ICD-10-CM | POA: Diagnosis not present

## 2018-01-27 DIAGNOSIS — N2581 Secondary hyperparathyroidism of renal origin: Secondary | ICD-10-CM | POA: Diagnosis not present

## 2018-01-27 DIAGNOSIS — Z992 Dependence on renal dialysis: Secondary | ICD-10-CM | POA: Diagnosis not present

## 2018-01-30 DIAGNOSIS — D509 Iron deficiency anemia, unspecified: Secondary | ICD-10-CM | POA: Diagnosis not present

## 2018-01-30 DIAGNOSIS — Z23 Encounter for immunization: Secondary | ICD-10-CM | POA: Diagnosis not present

## 2018-01-30 DIAGNOSIS — N186 End stage renal disease: Secondary | ICD-10-CM | POA: Diagnosis not present

## 2018-01-30 DIAGNOSIS — Z992 Dependence on renal dialysis: Secondary | ICD-10-CM | POA: Diagnosis not present

## 2018-01-30 DIAGNOSIS — N2581 Secondary hyperparathyroidism of renal origin: Secondary | ICD-10-CM | POA: Diagnosis not present

## 2018-01-30 DIAGNOSIS — D631 Anemia in chronic kidney disease: Secondary | ICD-10-CM | POA: Diagnosis not present

## 2018-02-01 ENCOUNTER — Telehealth: Payer: Self-pay | Admitting: *Deleted

## 2018-02-01 DIAGNOSIS — N2581 Secondary hyperparathyroidism of renal origin: Secondary | ICD-10-CM | POA: Diagnosis not present

## 2018-02-01 DIAGNOSIS — Z23 Encounter for immunization: Secondary | ICD-10-CM | POA: Diagnosis not present

## 2018-02-01 DIAGNOSIS — D509 Iron deficiency anemia, unspecified: Secondary | ICD-10-CM | POA: Diagnosis not present

## 2018-02-01 DIAGNOSIS — N186 End stage renal disease: Secondary | ICD-10-CM | POA: Diagnosis not present

## 2018-02-01 DIAGNOSIS — D631 Anemia in chronic kidney disease: Secondary | ICD-10-CM | POA: Diagnosis not present

## 2018-02-01 DIAGNOSIS — Z992 Dependence on renal dialysis: Secondary | ICD-10-CM | POA: Diagnosis not present

## 2018-02-02 DIAGNOSIS — N186 End stage renal disease: Secondary | ICD-10-CM | POA: Diagnosis not present

## 2018-02-02 DIAGNOSIS — I129 Hypertensive chronic kidney disease with stage 1 through stage 4 chronic kidney disease, or unspecified chronic kidney disease: Secondary | ICD-10-CM | POA: Diagnosis not present

## 2018-02-02 DIAGNOSIS — Z992 Dependence on renal dialysis: Secondary | ICD-10-CM | POA: Diagnosis not present

## 2018-02-03 DIAGNOSIS — N186 End stage renal disease: Secondary | ICD-10-CM | POA: Diagnosis not present

## 2018-02-03 DIAGNOSIS — N2581 Secondary hyperparathyroidism of renal origin: Secondary | ICD-10-CM | POA: Diagnosis not present

## 2018-02-03 DIAGNOSIS — Z992 Dependence on renal dialysis: Secondary | ICD-10-CM | POA: Diagnosis not present

## 2018-02-08 DIAGNOSIS — N2581 Secondary hyperparathyroidism of renal origin: Secondary | ICD-10-CM | POA: Diagnosis not present

## 2018-02-08 DIAGNOSIS — Z992 Dependence on renal dialysis: Secondary | ICD-10-CM | POA: Diagnosis not present

## 2018-02-08 DIAGNOSIS — N186 End stage renal disease: Secondary | ICD-10-CM | POA: Diagnosis not present

## 2018-02-10 DIAGNOSIS — N2581 Secondary hyperparathyroidism of renal origin: Secondary | ICD-10-CM | POA: Diagnosis not present

## 2018-02-10 DIAGNOSIS — Z992 Dependence on renal dialysis: Secondary | ICD-10-CM | POA: Diagnosis not present

## 2018-02-10 DIAGNOSIS — N186 End stage renal disease: Secondary | ICD-10-CM | POA: Diagnosis not present

## 2018-02-13 DIAGNOSIS — Z992 Dependence on renal dialysis: Secondary | ICD-10-CM | POA: Diagnosis not present

## 2018-02-13 DIAGNOSIS — N2581 Secondary hyperparathyroidism of renal origin: Secondary | ICD-10-CM | POA: Diagnosis not present

## 2018-02-13 DIAGNOSIS — N186 End stage renal disease: Secondary | ICD-10-CM | POA: Diagnosis not present

## 2018-02-16 DIAGNOSIS — I129 Hypertensive chronic kidney disease with stage 1 through stage 4 chronic kidney disease, or unspecified chronic kidney disease: Secondary | ICD-10-CM | POA: Diagnosis not present

## 2018-02-17 DIAGNOSIS — N186 End stage renal disease: Secondary | ICD-10-CM | POA: Diagnosis not present

## 2018-02-17 DIAGNOSIS — N2581 Secondary hyperparathyroidism of renal origin: Secondary | ICD-10-CM | POA: Diagnosis not present

## 2018-02-17 DIAGNOSIS — Z992 Dependence on renal dialysis: Secondary | ICD-10-CM | POA: Diagnosis not present

## 2018-02-20 DIAGNOSIS — N186 End stage renal disease: Secondary | ICD-10-CM | POA: Diagnosis not present

## 2018-02-20 DIAGNOSIS — N2581 Secondary hyperparathyroidism of renal origin: Secondary | ICD-10-CM | POA: Diagnosis not present

## 2018-02-20 DIAGNOSIS — Z992 Dependence on renal dialysis: Secondary | ICD-10-CM | POA: Diagnosis not present

## 2018-02-24 DIAGNOSIS — N2581 Secondary hyperparathyroidism of renal origin: Secondary | ICD-10-CM | POA: Diagnosis not present

## 2018-02-24 DIAGNOSIS — N186 End stage renal disease: Secondary | ICD-10-CM | POA: Diagnosis not present

## 2018-02-24 DIAGNOSIS — Z992 Dependence on renal dialysis: Secondary | ICD-10-CM | POA: Diagnosis not present

## 2018-02-28 DIAGNOSIS — N2581 Secondary hyperparathyroidism of renal origin: Secondary | ICD-10-CM | POA: Diagnosis not present

## 2018-02-28 DIAGNOSIS — Z992 Dependence on renal dialysis: Secondary | ICD-10-CM | POA: Diagnosis not present

## 2018-02-28 DIAGNOSIS — N186 End stage renal disease: Secondary | ICD-10-CM | POA: Diagnosis not present

## 2018-03-03 DIAGNOSIS — Z992 Dependence on renal dialysis: Secondary | ICD-10-CM | POA: Diagnosis not present

## 2018-03-03 DIAGNOSIS — N186 End stage renal disease: Secondary | ICD-10-CM | POA: Diagnosis not present

## 2018-03-03 DIAGNOSIS — N2581 Secondary hyperparathyroidism of renal origin: Secondary | ICD-10-CM | POA: Diagnosis not present

## 2018-03-04 DIAGNOSIS — I129 Hypertensive chronic kidney disease with stage 1 through stage 4 chronic kidney disease, or unspecified chronic kidney disease: Secondary | ICD-10-CM | POA: Diagnosis not present

## 2018-03-04 DIAGNOSIS — N186 End stage renal disease: Secondary | ICD-10-CM | POA: Diagnosis not present

## 2018-03-04 DIAGNOSIS — Z992 Dependence on renal dialysis: Secondary | ICD-10-CM | POA: Diagnosis not present

## 2018-03-08 DIAGNOSIS — N186 End stage renal disease: Secondary | ICD-10-CM | POA: Diagnosis not present

## 2018-03-08 DIAGNOSIS — Z992 Dependence on renal dialysis: Secondary | ICD-10-CM | POA: Diagnosis not present

## 2018-03-08 DIAGNOSIS — N2581 Secondary hyperparathyroidism of renal origin: Secondary | ICD-10-CM | POA: Diagnosis not present

## 2018-03-08 DIAGNOSIS — D689 Coagulation defect, unspecified: Secondary | ICD-10-CM | POA: Diagnosis not present

## 2018-03-10 DIAGNOSIS — Z992 Dependence on renal dialysis: Secondary | ICD-10-CM | POA: Diagnosis not present

## 2018-03-10 DIAGNOSIS — N2581 Secondary hyperparathyroidism of renal origin: Secondary | ICD-10-CM | POA: Diagnosis not present

## 2018-03-10 DIAGNOSIS — N186 End stage renal disease: Secondary | ICD-10-CM | POA: Diagnosis not present

## 2018-03-10 DIAGNOSIS — D689 Coagulation defect, unspecified: Secondary | ICD-10-CM | POA: Diagnosis not present

## 2018-03-13 DIAGNOSIS — D689 Coagulation defect, unspecified: Secondary | ICD-10-CM | POA: Diagnosis not present

## 2018-03-13 DIAGNOSIS — N2581 Secondary hyperparathyroidism of renal origin: Secondary | ICD-10-CM | POA: Diagnosis not present

## 2018-03-13 DIAGNOSIS — Z992 Dependence on renal dialysis: Secondary | ICD-10-CM | POA: Diagnosis not present

## 2018-03-13 DIAGNOSIS — N186 End stage renal disease: Secondary | ICD-10-CM | POA: Diagnosis not present

## 2018-03-15 DIAGNOSIS — Z992 Dependence on renal dialysis: Secondary | ICD-10-CM | POA: Diagnosis not present

## 2018-03-15 DIAGNOSIS — N186 End stage renal disease: Secondary | ICD-10-CM | POA: Diagnosis not present

## 2018-03-15 DIAGNOSIS — D689 Coagulation defect, unspecified: Secondary | ICD-10-CM | POA: Diagnosis not present

## 2018-03-15 DIAGNOSIS — N2581 Secondary hyperparathyroidism of renal origin: Secondary | ICD-10-CM | POA: Diagnosis not present

## 2018-03-17 DIAGNOSIS — Z992 Dependence on renal dialysis: Secondary | ICD-10-CM | POA: Diagnosis not present

## 2018-03-17 DIAGNOSIS — N186 End stage renal disease: Secondary | ICD-10-CM | POA: Diagnosis not present

## 2018-03-17 DIAGNOSIS — N2581 Secondary hyperparathyroidism of renal origin: Secondary | ICD-10-CM | POA: Diagnosis not present

## 2018-03-17 DIAGNOSIS — D689 Coagulation defect, unspecified: Secondary | ICD-10-CM | POA: Diagnosis not present

## 2018-03-20 DIAGNOSIS — N186 End stage renal disease: Secondary | ICD-10-CM | POA: Diagnosis not present

## 2018-03-20 DIAGNOSIS — D689 Coagulation defect, unspecified: Secondary | ICD-10-CM | POA: Diagnosis not present

## 2018-03-20 DIAGNOSIS — N2581 Secondary hyperparathyroidism of renal origin: Secondary | ICD-10-CM | POA: Diagnosis not present

## 2018-03-20 DIAGNOSIS — Z992 Dependence on renal dialysis: Secondary | ICD-10-CM | POA: Diagnosis not present

## 2018-03-22 DIAGNOSIS — N186 End stage renal disease: Secondary | ICD-10-CM | POA: Diagnosis not present

## 2018-03-22 DIAGNOSIS — D689 Coagulation defect, unspecified: Secondary | ICD-10-CM | POA: Diagnosis not present

## 2018-03-22 DIAGNOSIS — N2581 Secondary hyperparathyroidism of renal origin: Secondary | ICD-10-CM | POA: Diagnosis not present

## 2018-03-22 DIAGNOSIS — Z992 Dependence on renal dialysis: Secondary | ICD-10-CM | POA: Diagnosis not present

## 2018-03-24 DIAGNOSIS — D689 Coagulation defect, unspecified: Secondary | ICD-10-CM | POA: Diagnosis not present

## 2018-03-24 DIAGNOSIS — N186 End stage renal disease: Secondary | ICD-10-CM | POA: Diagnosis not present

## 2018-03-24 DIAGNOSIS — N2581 Secondary hyperparathyroidism of renal origin: Secondary | ICD-10-CM | POA: Diagnosis not present

## 2018-03-24 DIAGNOSIS — Z992 Dependence on renal dialysis: Secondary | ICD-10-CM | POA: Diagnosis not present

## 2018-03-29 DIAGNOSIS — Z992 Dependence on renal dialysis: Secondary | ICD-10-CM | POA: Diagnosis not present

## 2018-03-29 DIAGNOSIS — N2581 Secondary hyperparathyroidism of renal origin: Secondary | ICD-10-CM | POA: Diagnosis not present

## 2018-03-29 DIAGNOSIS — D689 Coagulation defect, unspecified: Secondary | ICD-10-CM | POA: Diagnosis not present

## 2018-03-29 DIAGNOSIS — I482 Chronic atrial fibrillation, unspecified: Secondary | ICD-10-CM | POA: Diagnosis not present

## 2018-03-29 DIAGNOSIS — N186 End stage renal disease: Secondary | ICD-10-CM | POA: Diagnosis not present

## 2018-03-31 DIAGNOSIS — N2581 Secondary hyperparathyroidism of renal origin: Secondary | ICD-10-CM | POA: Diagnosis not present

## 2018-03-31 DIAGNOSIS — D689 Coagulation defect, unspecified: Secondary | ICD-10-CM | POA: Diagnosis not present

## 2018-03-31 DIAGNOSIS — N186 End stage renal disease: Secondary | ICD-10-CM | POA: Diagnosis not present

## 2018-03-31 DIAGNOSIS — Z992 Dependence on renal dialysis: Secondary | ICD-10-CM | POA: Diagnosis not present

## 2018-04-04 DIAGNOSIS — N186 End stage renal disease: Secondary | ICD-10-CM | POA: Diagnosis not present

## 2018-04-04 DIAGNOSIS — I129 Hypertensive chronic kidney disease with stage 1 through stage 4 chronic kidney disease, or unspecified chronic kidney disease: Secondary | ICD-10-CM | POA: Diagnosis not present

## 2018-04-04 DIAGNOSIS — Z992 Dependence on renal dialysis: Secondary | ICD-10-CM | POA: Diagnosis not present

## 2018-04-05 DIAGNOSIS — Z992 Dependence on renal dialysis: Secondary | ICD-10-CM | POA: Diagnosis not present

## 2018-04-05 DIAGNOSIS — N186 End stage renal disease: Secondary | ICD-10-CM | POA: Diagnosis not present

## 2018-04-05 DIAGNOSIS — N2581 Secondary hyperparathyroidism of renal origin: Secondary | ICD-10-CM | POA: Diagnosis not present

## 2018-04-07 DIAGNOSIS — N2581 Secondary hyperparathyroidism of renal origin: Secondary | ICD-10-CM | POA: Diagnosis not present

## 2018-04-07 DIAGNOSIS — N186 End stage renal disease: Secondary | ICD-10-CM | POA: Diagnosis not present

## 2018-04-07 DIAGNOSIS — Z992 Dependence on renal dialysis: Secondary | ICD-10-CM | POA: Diagnosis not present

## 2018-04-10 DIAGNOSIS — N2581 Secondary hyperparathyroidism of renal origin: Secondary | ICD-10-CM | POA: Diagnosis not present

## 2018-04-10 DIAGNOSIS — N186 End stage renal disease: Secondary | ICD-10-CM | POA: Diagnosis not present

## 2018-04-10 DIAGNOSIS — Z992 Dependence on renal dialysis: Secondary | ICD-10-CM | POA: Diagnosis not present

## 2018-04-14 DIAGNOSIS — N186 End stage renal disease: Secondary | ICD-10-CM | POA: Diagnosis not present

## 2018-04-14 DIAGNOSIS — N2581 Secondary hyperparathyroidism of renal origin: Secondary | ICD-10-CM | POA: Diagnosis not present

## 2018-04-14 DIAGNOSIS — Z992 Dependence on renal dialysis: Secondary | ICD-10-CM | POA: Diagnosis not present

## 2018-04-17 DIAGNOSIS — N2581 Secondary hyperparathyroidism of renal origin: Secondary | ICD-10-CM | POA: Diagnosis not present

## 2018-04-17 DIAGNOSIS — N186 End stage renal disease: Secondary | ICD-10-CM | POA: Diagnosis not present

## 2018-04-17 DIAGNOSIS — Z992 Dependence on renal dialysis: Secondary | ICD-10-CM | POA: Diagnosis not present

## 2018-04-19 DIAGNOSIS — Z992 Dependence on renal dialysis: Secondary | ICD-10-CM | POA: Diagnosis not present

## 2018-04-19 DIAGNOSIS — N2581 Secondary hyperparathyroidism of renal origin: Secondary | ICD-10-CM | POA: Diagnosis not present

## 2018-04-19 DIAGNOSIS — N186 End stage renal disease: Secondary | ICD-10-CM | POA: Diagnosis not present

## 2018-04-21 DIAGNOSIS — N2581 Secondary hyperparathyroidism of renal origin: Secondary | ICD-10-CM | POA: Diagnosis not present

## 2018-04-21 DIAGNOSIS — N186 End stage renal disease: Secondary | ICD-10-CM | POA: Diagnosis not present

## 2018-04-21 DIAGNOSIS — Z992 Dependence on renal dialysis: Secondary | ICD-10-CM | POA: Diagnosis not present

## 2018-04-24 DIAGNOSIS — Z992 Dependence on renal dialysis: Secondary | ICD-10-CM | POA: Diagnosis not present

## 2018-04-24 DIAGNOSIS — N186 End stage renal disease: Secondary | ICD-10-CM | POA: Diagnosis not present

## 2018-04-24 DIAGNOSIS — N2581 Secondary hyperparathyroidism of renal origin: Secondary | ICD-10-CM | POA: Diagnosis not present

## 2018-04-26 ENCOUNTER — Telehealth: Payer: Self-pay

## 2018-04-26 NOTE — Telephone Encounter (Signed)
Called pt left msg to call back to schedule overdue inr

## 2018-04-28 DIAGNOSIS — N2581 Secondary hyperparathyroidism of renal origin: Secondary | ICD-10-CM | POA: Diagnosis not present

## 2018-04-28 DIAGNOSIS — Z992 Dependence on renal dialysis: Secondary | ICD-10-CM | POA: Diagnosis not present

## 2018-04-28 DIAGNOSIS — I482 Chronic atrial fibrillation, unspecified: Secondary | ICD-10-CM | POA: Diagnosis not present

## 2018-04-28 DIAGNOSIS — N186 End stage renal disease: Secondary | ICD-10-CM | POA: Diagnosis not present

## 2018-05-01 DIAGNOSIS — N186 End stage renal disease: Secondary | ICD-10-CM | POA: Diagnosis not present

## 2018-05-01 DIAGNOSIS — Z992 Dependence on renal dialysis: Secondary | ICD-10-CM | POA: Diagnosis not present

## 2018-05-01 DIAGNOSIS — N2581 Secondary hyperparathyroidism of renal origin: Secondary | ICD-10-CM | POA: Diagnosis not present

## 2018-05-02 ENCOUNTER — Encounter (INDEPENDENT_AMBULATORY_CARE_PROVIDER_SITE_OTHER): Payer: Medicare Other | Admitting: Ophthalmology

## 2018-05-03 ENCOUNTER — Telehealth: Payer: Self-pay

## 2018-05-03 DIAGNOSIS — N186 End stage renal disease: Secondary | ICD-10-CM | POA: Diagnosis not present

## 2018-05-03 DIAGNOSIS — Z992 Dependence on renal dialysis: Secondary | ICD-10-CM | POA: Diagnosis not present

## 2018-05-03 DIAGNOSIS — N2581 Secondary hyperparathyroidism of renal origin: Secondary | ICD-10-CM | POA: Diagnosis not present

## 2018-05-03 NOTE — Telephone Encounter (Signed)
Scheduled pt overdur inr 05/09/18

## 2018-05-05 DIAGNOSIS — Z992 Dependence on renal dialysis: Secondary | ICD-10-CM | POA: Diagnosis not present

## 2018-05-05 DIAGNOSIS — I129 Hypertensive chronic kidney disease with stage 1 through stage 4 chronic kidney disease, or unspecified chronic kidney disease: Secondary | ICD-10-CM | POA: Diagnosis not present

## 2018-05-05 DIAGNOSIS — N2581 Secondary hyperparathyroidism of renal origin: Secondary | ICD-10-CM | POA: Diagnosis not present

## 2018-05-05 DIAGNOSIS — N186 End stage renal disease: Secondary | ICD-10-CM | POA: Diagnosis not present

## 2018-05-09 ENCOUNTER — Ambulatory Visit (INDEPENDENT_AMBULATORY_CARE_PROVIDER_SITE_OTHER): Payer: Medicare Other

## 2018-05-09 DIAGNOSIS — Z7901 Long term (current) use of anticoagulants: Secondary | ICD-10-CM | POA: Diagnosis not present

## 2018-05-09 DIAGNOSIS — I4891 Unspecified atrial fibrillation: Secondary | ICD-10-CM

## 2018-05-09 LAB — POCT INR: INR: 1.4 — AB (ref 2.0–3.0)

## 2018-05-09 NOTE — Patient Instructions (Signed)
Description   Take 2 tablets today and tomorrow, then resume same dosage 1.5 tablets daily except 1 tablet on Mondays and Fridays. Recheck INR in 1 week. Continue eating dark green leafy veggies 2-3 times a week.

## 2018-05-12 DIAGNOSIS — Z992 Dependence on renal dialysis: Secondary | ICD-10-CM | POA: Diagnosis not present

## 2018-05-12 DIAGNOSIS — N2581 Secondary hyperparathyroidism of renal origin: Secondary | ICD-10-CM | POA: Diagnosis not present

## 2018-05-12 DIAGNOSIS — N186 End stage renal disease: Secondary | ICD-10-CM | POA: Diagnosis not present

## 2018-05-16 ENCOUNTER — Encounter: Payer: Self-pay | Admitting: Cardiology

## 2018-05-17 DIAGNOSIS — N2581 Secondary hyperparathyroidism of renal origin: Secondary | ICD-10-CM | POA: Diagnosis not present

## 2018-05-17 DIAGNOSIS — N186 End stage renal disease: Secondary | ICD-10-CM | POA: Diagnosis not present

## 2018-05-17 DIAGNOSIS — Z992 Dependence on renal dialysis: Secondary | ICD-10-CM | POA: Diagnosis not present

## 2018-05-19 ENCOUNTER — Emergency Department (HOSPITAL_COMMUNITY): Payer: Medicare Other

## 2018-05-19 ENCOUNTER — Other Ambulatory Visit: Payer: Self-pay

## 2018-05-19 ENCOUNTER — Inpatient Hospital Stay (HOSPITAL_COMMUNITY)
Admission: EM | Admit: 2018-05-19 | Discharge: 2018-05-21 | DRG: 308 | Disposition: A | Discharge status: Home / Self Care | Payer: Medicare Other | Attending: Internal Medicine | Admitting: Internal Medicine

## 2018-05-19 ENCOUNTER — Encounter (HOSPITAL_COMMUNITY): Payer: Self-pay

## 2018-05-19 DIAGNOSIS — H3553 Other dystrophies primarily involving the sensory retina: Secondary | ICD-10-CM | POA: Diagnosis present

## 2018-05-19 DIAGNOSIS — Z823 Family history of stroke: Secondary | ICD-10-CM

## 2018-05-19 DIAGNOSIS — N186 End stage renal disease: Secondary | ICD-10-CM | POA: Diagnosis present

## 2018-05-19 DIAGNOSIS — Z79899 Other long term (current) drug therapy: Secondary | ICD-10-CM | POA: Diagnosis not present

## 2018-05-19 DIAGNOSIS — N2581 Secondary hyperparathyroidism of renal origin: Secondary | ICD-10-CM | POA: Diagnosis present

## 2018-05-19 DIAGNOSIS — I4891 Unspecified atrial fibrillation: Secondary | ICD-10-CM | POA: Diagnosis not present

## 2018-05-19 DIAGNOSIS — G629 Polyneuropathy, unspecified: Secondary | ICD-10-CM | POA: Diagnosis present

## 2018-05-19 DIAGNOSIS — D631 Anemia in chronic kidney disease: Secondary | ICD-10-CM | POA: Diagnosis present

## 2018-05-19 DIAGNOSIS — Z888 Allergy status to other drugs, medicaments and biological substances status: Secondary | ICD-10-CM | POA: Diagnosis not present

## 2018-05-19 DIAGNOSIS — I7103 Dissection of thoracoabdominal aorta: Secondary | ICD-10-CM | POA: Diagnosis present

## 2018-05-19 DIAGNOSIS — Z8349 Family history of other endocrine, nutritional and metabolic diseases: Secondary | ICD-10-CM

## 2018-05-19 DIAGNOSIS — Z992 Dependence on renal dialysis: Secondary | ICD-10-CM

## 2018-05-19 DIAGNOSIS — Z8249 Family history of ischemic heart disease and other diseases of the circulatory system: Secondary | ICD-10-CM

## 2018-05-19 DIAGNOSIS — J984 Other disorders of lung: Secondary | ICD-10-CM | POA: Diagnosis not present

## 2018-05-19 DIAGNOSIS — Z8679 Personal history of other diseases of the circulatory system: Secondary | ICD-10-CM

## 2018-05-19 DIAGNOSIS — G8220 Paraplegia, unspecified: Secondary | ICD-10-CM | POA: Diagnosis present

## 2018-05-19 DIAGNOSIS — I480 Paroxysmal atrial fibrillation: Principal | ICD-10-CM | POA: Diagnosis present

## 2018-05-19 DIAGNOSIS — I120 Hypertensive chronic kidney disease with stage 5 chronic kidney disease or end stage renal disease: Secondary | ICD-10-CM | POA: Diagnosis present

## 2018-05-19 DIAGNOSIS — H547 Unspecified visual loss: Secondary | ICD-10-CM | POA: Diagnosis present

## 2018-05-19 DIAGNOSIS — I499 Cardiac arrhythmia, unspecified: Secondary | ICD-10-CM | POA: Diagnosis not present

## 2018-05-19 DIAGNOSIS — Z7901 Long term (current) use of anticoagulants: Secondary | ICD-10-CM

## 2018-05-19 DIAGNOSIS — R0902 Hypoxemia: Secondary | ICD-10-CM | POA: Diagnosis not present

## 2018-05-19 DIAGNOSIS — E8889 Other specified metabolic disorders: Secondary | ICD-10-CM | POA: Diagnosis present

## 2018-05-19 DIAGNOSIS — I361 Nonrheumatic tricuspid (valve) insufficiency: Secondary | ICD-10-CM | POA: Diagnosis not present

## 2018-05-19 DIAGNOSIS — Z881 Allergy status to other antibiotic agents status: Secondary | ICD-10-CM | POA: Diagnosis not present

## 2018-05-19 DIAGNOSIS — I4892 Unspecified atrial flutter: Secondary | ICD-10-CM | POA: Diagnosis not present

## 2018-05-19 DIAGNOSIS — I959 Hypotension, unspecified: Secondary | ICD-10-CM | POA: Diagnosis not present

## 2018-05-19 DIAGNOSIS — I7101 Dissection of thoracic aorta: Secondary | ICD-10-CM | POA: Diagnosis present

## 2018-05-19 DIAGNOSIS — R42 Dizziness and giddiness: Secondary | ICD-10-CM

## 2018-05-19 HISTORY — DX: Paroxysmal atrial fibrillation: I48.0

## 2018-05-19 LAB — POCT I-STAT EG7
Acid-Base Excess: 1 mmol/L (ref 0.0–2.0)
Bicarbonate: 26.8 mmol/L (ref 20.0–28.0)
Calcium, Ion: 0.98 mmol/L — ABNORMAL LOW (ref 1.15–1.40)
HCT: 40 % (ref 39.0–52.0)
Hemoglobin: 13.6 g/dL (ref 13.0–17.0)
O2 Saturation: 71 %
PO2 VEN: 38 mmHg (ref 32.0–45.0)
Potassium: 3.8 mmol/L (ref 3.5–5.1)
Sodium: 140 mmol/L (ref 135–145)
TCO2: 28 mmol/L (ref 22–32)
pCO2, Ven: 45.6 mmHg (ref 44.0–60.0)
pH, Ven: 7.377 (ref 7.250–7.430)

## 2018-05-19 LAB — CBC
HEMATOCRIT: 42.7 % (ref 39.0–52.0)
HEMOGLOBIN: 13.4 g/dL (ref 13.0–17.0)
MCH: 31.4 pg (ref 26.0–34.0)
MCHC: 31.4 g/dL (ref 30.0–36.0)
MCV: 100 fL (ref 80.0–100.0)
Platelets: 145 10*3/uL — ABNORMAL LOW (ref 150–400)
RBC: 4.27 MIL/uL (ref 4.22–5.81)
RDW: 14.8 % (ref 11.5–15.5)
WBC: 8 10*3/uL (ref 4.0–10.5)
nRBC: 0 % (ref 0.0–0.2)

## 2018-05-19 LAB — I-STAT CREATININE, ED: Creatinine, Ser: 11.5 mg/dL — ABNORMAL HIGH (ref 0.61–1.24)

## 2018-05-19 LAB — TROPONIN I
Troponin I: 0.06 ng/mL (ref <0.03)
Troponin I: 0.1 ng/mL (ref <0.03)

## 2018-05-19 LAB — CBG MONITORING, ED: Glucose-Capillary: 92 mg/dL (ref 70–99)

## 2018-05-19 LAB — MAGNESIUM: Magnesium: 1.9 mg/dL (ref 1.7–2.4)

## 2018-05-19 LAB — PROTIME-INR
INR: 2.69
Prothrombin Time: 28.2 seconds — ABNORMAL HIGH (ref 11.4–15.2)

## 2018-05-19 LAB — BASIC METABOLIC PANEL
Anion gap: 18 — ABNORMAL HIGH (ref 5–15)
BUN: 35 mg/dL — ABNORMAL HIGH (ref 6–20)
CO2: 22 mmol/L (ref 22–32)
Calcium: 9 mg/dL (ref 8.9–10.3)
Chloride: 101 mmol/L (ref 98–111)
Creatinine, Ser: 10.85 mg/dL — ABNORMAL HIGH (ref 0.61–1.24)
GFR calc Af Amer: 6 mL/min — ABNORMAL LOW (ref >60)
GFR calc non Af Amer: 5 mL/min — ABNORMAL LOW (ref >60)
Glucose, Bld: 96 mg/dL (ref 70–99)
Potassium: 3.9 mmol/L (ref 3.5–5.1)
Sodium: 141 mmol/L (ref 135–145)

## 2018-05-19 LAB — MRSA PCR SCREENING: MRSA by PCR: NEGATIVE

## 2018-05-19 LAB — TSH: TSH: 1.088 u[IU]/mL (ref 0.350–4.500)

## 2018-05-19 MED ORDER — ACETAMINOPHEN 325 MG PO TABS
650.0000 mg | ORAL_TABLET | ORAL | Status: DC | PRN
  Administered 2018-05-20: 650 mg via ORAL
  Filled 2018-05-19: qty 2

## 2018-05-19 MED ORDER — AMIODARONE LOAD VIA INFUSION
150.0000 mg | Freq: Once | INTRAVENOUS | Status: AC
  Administered 2018-05-19: 150 mg via INTRAVENOUS
  Filled 2018-05-19: qty 83.34

## 2018-05-19 MED ORDER — AMIODARONE HCL IN DEXTROSE 360-4.14 MG/200ML-% IV SOLN
60.0000 mg/h | INTRAVENOUS | Status: AC
  Administered 2018-05-19: 60 mg/h via INTRAVENOUS
  Filled 2018-05-19: qty 200

## 2018-05-19 MED ORDER — MAGNESIUM OXIDE 400 (241.3 MG) MG PO TABS
400.0000 mg | ORAL_TABLET | Freq: Two times a day (BID) | ORAL | Status: AC
  Administered 2018-05-19 (×2): 400 mg via ORAL
  Filled 2018-05-19 (×2): qty 1

## 2018-05-19 MED ORDER — WARFARIN - PHARMACIST DOSING INPATIENT
Freq: Every day | Status: DC
  Administered 2018-05-19 – 2018-05-21 (×3)

## 2018-05-19 MED ORDER — ONDANSETRON HCL 4 MG/2ML IJ SOLN: 4.0000 mg | Freq: Four times a day (QID) | INTRAMUSCULAR | Status: DC | PRN

## 2018-05-19 MED ORDER — WARFARIN SODIUM 5 MG PO TABS: 5.0000 mg | ORAL_TABLET | Freq: Every day | ORAL | Status: DC

## 2018-05-19 MED ORDER — DILTIAZEM LOAD VIA INFUSION
10.0000 mg | Freq: Once | INTRAVENOUS | Status: AC
  Administered 2018-05-19: 10 mg via INTRAVENOUS
  Filled 2018-05-19: qty 10

## 2018-05-19 MED ORDER — SODIUM CHLORIDE 0.9 % IV SOLN: 100.0000 mL | INTRAVENOUS | Status: DC | PRN

## 2018-05-19 MED ORDER — CINACALCET HCL 30 MG PO TABS: 60.0000 mg | ORAL_TABLET | Freq: Every day | ORAL | Status: DC

## 2018-05-19 MED ORDER — LABETALOL HCL 200 MG PO TABS
100.0000 mg | ORAL_TABLET | Freq: Two times a day (BID) | ORAL | Status: DC
  Administered 2018-05-19 – 2018-05-20 (×3): 100 mg via ORAL
  Filled 2018-05-19 (×3): qty 1

## 2018-05-19 MED ORDER — AMIODARONE HCL IN DEXTROSE 360-4.14 MG/200ML-% IV SOLN
30.0000 mg/h | INTRAVENOUS | Status: DC
  Administered 2018-05-20: 30 mg/h via INTRAVENOUS
  Filled 2018-05-19 (×2): qty 200

## 2018-05-19 MED ORDER — ADULT MULTIVITAMIN W/MINERALS CH
1.0000 | ORAL_TABLET | Freq: Every day | ORAL | Status: DC
  Administered 2018-05-19 – 2018-05-20 (×2): 1 via ORAL
  Filled 2018-05-19 (×5): qty 1

## 2018-05-19 MED ORDER — CHLORHEXIDINE GLUCONATE CLOTH 2 % EX PADS
6.0000 | MEDICATED_PAD | Freq: Every day | CUTANEOUS | Status: DC
  Administered 2018-05-20: 6 via TOPICAL

## 2018-05-19 MED ORDER — LIDOCAINE-PRILOCAINE 2.5-2.5 % EX CREA
1.0000 "application " | TOPICAL_CREAM | CUTANEOUS | Status: DC | PRN
  Filled 2018-05-19: qty 5

## 2018-05-19 MED ORDER — ALTEPLASE 2 MG IJ SOLR: 2.0000 mg | Freq: Once | INTRAMUSCULAR | Status: DC | PRN

## 2018-05-19 MED ORDER — SEVELAMER CARBONATE 2.4 G PO PACK
2.4000 g | PACK | Freq: Three times a day (TID) | ORAL | Status: DC
  Administered 2018-05-19 – 2018-05-21 (×5): 2.4 g via ORAL
  Filled 2018-05-19 (×7): qty 1

## 2018-05-19 MED ORDER — DILTIAZEM HCL-DEXTROSE 100-5 MG/100ML-% IV SOLN (PREMIX)
5.0000 mg/h | INTRAVENOUS | Status: DC
  Administered 2018-05-19: 5 mg/h via INTRAVENOUS
  Filled 2018-05-19: qty 100

## 2018-05-19 MED ORDER — WARFARIN SODIUM 7.5 MG PO TABS
7.5000 mg | ORAL_TABLET | Freq: Once | ORAL | Status: AC
  Administered 2018-05-19: 7.5 mg via ORAL
  Filled 2018-05-19 (×2): qty 1

## 2018-05-19 MED ORDER — PENTAFLUOROPROP-TETRAFLUOROETH EX AERO
1.0000 "application " | INHALATION_SPRAY | CUTANEOUS | Status: DC | PRN
  Filled 2018-05-19: qty 30

## 2018-05-19 MED ORDER — LIDOCAINE HCL (PF) 1 % IJ SOLN: 5.0000 mL | INTRAMUSCULAR | Status: DC | PRN

## 2018-05-19 MED ORDER — SEVELAMER CARBONATE 0.8 G PO PACK: 1.6000 g | PACK | Freq: Three times a day (TID) | ORAL | Status: DC

## 2018-05-19 NOTE — ED Triage Notes (Signed)
Pt arrives with [facility] c/o dizziness, weakness, and chest palpitations. Pt has hx of afib and goes to dialysis on Tu/Thurs/Saturday. Pt states he went to his scheduled dialysis on Thursday. No meds given by EMS; hypotensive per EMS with BP 90/60; 200 ml normal saline bolus given by EMS. Pt denies any n/v/d.  A&o x4; VSS at this time.

## 2018-05-19 NOTE — ED Notes (Signed)
ED Provider at bedside. 

## 2018-05-19 NOTE — Progress Notes (Addendum)
Pt received from ED with a diagnosis of A-fib with RVR, pt is in amiodarone drip at 30mg /hr, scheduled for HD this evening but BP running softer side, Made HD nurse aware about BP she said she will double check with MD, later transport came to pick up the patient, pt refused to HD and he said his BP is too low for HD and he knows his body, tried to reach HD nurse, unable to reach her since phone was just ringing, later asked transport tech to ask RN to call me back, none call received till 7 pm, report provided to the night shift nurse, Family member updated regarding plan of care,will continue to monitor  Palma Holter, RN

## 2018-05-19 NOTE — Progress Notes (Signed)
Received report on pt @ 1812, transport sent to get pt, transport came back to me and told me that pt refused his HD tx,  dayshift primary nurse was aware, she told transport for me to call her. I attempted to call her 2x w/ no answer from anyone on the unit, phone just rang.  Attempted again to get in touch w/ pt's primary nurse, this time the night shift nurse to let her know to let pt know to anticipate that he possibly may be ran on Sunday since he refused tx tonight. Primary nurse didn't answer 2x attempt from secretary trying to connect [place]. msg left w/ secretary to give to primary nurse

## 2018-05-19 NOTE — ED Provider Notes (Signed)
[facility] EMERGENCY DEPARTMENT Provider Note   CSN: [id number] Arrival date & time: 05/19/18  8115     History   Chief Complaint Chief Complaint  Patient presents with  . Dizziness  . Atrial Fibrillation    HPI Corey [name] is a 48 y.o. 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  Patient states that he awoke this morning around 5 AM in rapid ventricular rate.  His recent INRs have been subtherapeutic.  Patient states that he frequently forgets to take the medication.  He states that he has had increased frequency of A. fib and usually it lasts between 15 and 30 minutes however this morning has lasted for hours without stopping.  The patient has also noted hypotension for the past 2 weeks without change in medications.  He denies fevers, chills.  He takes labetalol and took his medication this morning when he felt his heart racing.  He denies chest pain or shortness of breath.  HPI  Past Medical History:  Diagnosis Date  . A-fib ([place])   . Arthritis    hands and shoulders  . Blindness and low vision    "Stargardt disease"  . Dissection of aorta ([place]) 2004  . Dysrhythmia    A-fib  . ESRD (end stage renal disease) ([place])   . Headache   . History of cardioversion 2014  . Hypertension   . Neuropathy   . Non-healing non-surgical wound 03/2016  . Paralysis ([place])    due to dissection of aorta in 2004, lower extremities  . Pneumonia     Patient Active Problem List   Diagnosis Date Noted  . Dissection of thoracic aorta ([place])   . Essential hypertension   . Chronic diastolic CHF (congestive heart failure) ([place])   . Atrial fibrillation, chronic   . End-stage renal disease on hemodialysis  ([place])   . Thrombocytopenia ([place])   . Peripheral neuropathy   . Thoracoabdominal aortic aneurysm (TAAA) without rupture ([place])   . Chest pain 01/18/2017  . Long term (current) use of anticoagulants [Z79.01] 12/30/2016  . Calciphylaxis 05/06/2016  . Arm wound, left, sequela 04/28/2016  . Nonhealing surgical wound 04/13/2016  . ESRD on dialysis ([place]) 02/19/2016  . Atrial fibrillation ([place]) [I48.91] 12/23/2015    Past Surgical History:  Procedure Laterality Date  . APPLICATION OF WOUND VAC Left 04/13/2016   Procedure: APPLICATION OF WOUND VAC;  Surgeon: Conrad [place], MD;  Location: [facility];  Service: Vascular;  Laterality: Left;  . APPLICATION OF WOUND VAC Left 04/18/2016   Procedure: APPLICATION OF WOUND VAC;  Surgeon: Waynetta Sandy, MD;  Location: [facility];  Service: Vascular;  Laterality: Left;  Wound vac change   . APPLICATION OF WOUND VAC Left 04/20/2016   Procedure: WOUND VAC CHANGE;  Surgeon: Conrad [place], MD;  Location: [facility];  Service: Vascular;  Laterality: Left;  . AV FISTULA PLACEMENT    . BASCILIC VEIN TRANSPOSITION Left 12/09/2015   Procedure: FIRST STAGE BASILIC VEIN TRANSPOSITION LEFT UPPER ARM;  Surgeon: Conrad [place], MD;  Location: [facility];  Service: Vascular;  Laterality: Left;  . BASCILIC VEIN TRANSPOSITION Left 03/09/2016   Procedure: SECOND STAGE BASILIC VEIN TRANSPOSITION WITH REVISION OF ANASTOMOSIS LEFT UPPER ARM;  Surgeon: Conrad [place], MD;  Location: [facility];  Service: Vascular;  Laterality: Left;  . CARDIOVERSION    . REPAIR OF ACUTE ASCENDING THORACIC AORTIC DISSECTION    . REVISON OF ARTERIOVENOUS FISTULA Left 04/20/2016   Procedure: LIGATION OF BASILIC VEIN TRANSPOSITION;  Surgeon: Conrad Muniz, MD;  Location: [facility];  Service: Vascular;  Laterality: Left;  . WOUND DEBRIDEMENT Left 04/13/2016   Procedure: DEBRIDEMENT WOUND;  Surgeon: Conrad [place], MD;  Location: [facility];  Service: Vascular;  Laterality: Left;        Home Medications    Prior to Admission  medications   Medication Sig Start Date End Date Taking? Authorizing Provider  cinacalcet (SENSIPAR) 30 MG tablet Take 2 tablets (60 mg total) by mouth daily with supper. 01/26/17   Allie Bossier, MD  labetalol (NORMODYNE) 100 MG tablet Take 1 tablet (100 mg total) by mouth 2 (two) times daily. 04/19/17   Clent Demark, PA-C  sevelamer carbonate (RENVELA) 0.8 g PACK packet Take 1.6 g by mouth 3 (three) times daily with meals. 01/26/17   Allie Bossier, MD  warfarin (COUMADIN) 5 MG tablet Take as directed by coumadin clinic 08/21/17   Jerline Pain, MD    Family History Family History  Problem Relation Age of Onset  . Cancer Mother   . Hypertension Mother   . Cancer Father   . Hypertension Father   . Thyroid disease Sister   . Stroke Brother        34    Social History Social History   Tobacco Use  . Smoking status: Never Smoker  . Smokeless tobacco: Never Used  Substance Use Topics  . Alcohol use: No  . Drug use: No     Allergies   Ciprofloxacin and Heparin   Review of Systems Review of Systems   Physical Exam Updated Vital Signs BP 104/70   Pulse (!) 113   Resp 13   Ht 6\' 2"  (1.88 m)   Wt 85 kg   SpO2 98%   BMI 24.06 kg/m   Physical Exam Vitals signs and nursing note reviewed.  Constitutional:      General: He is not in acute distress.    Appearance: He is well-developed. He is not diaphoretic.  HENT:     Head: Normocephalic and atraumatic.     Mouth/Throat:     Mouth: Mucous membranes are dry.  Eyes:     General: No scleral icterus.    Extraocular Movements: Extraocular movements intact.     Conjunctiva/sclera: Conjunctivae normal.     Pupils: Pupils are equal, round, and reactive to light.  Neck:     Musculoskeletal: Normal range of motion and neck supple.     Vascular: JVD present.  Cardiovascular:     Rate and Rhythm: Tachycardia present. Rhythm irregular.     Heart sounds: Normal heart sounds.  Pulmonary:     Effort: Pulmonary  effort is normal. No respiratory distress.     Breath sounds: Normal breath sounds.  Abdominal:     Palpations: Abdomen is soft.     Tenderness: There is no abdominal tenderness.  Skin:    General: Skin is warm and dry.  Neurological:     Mental Status: He is alert.  Psychiatric:        Behavior: Behavior normal.      ED Treatments / Results  Labs (all labs ordered are listed, but only abnormal results are displayed) Labs Reviewed  BASIC METABOLIC PANEL  MAGNESIUM  CBC  TSH  PROTIME-INR  I-STAT CREATININE, ED  CBG MONITORING, ED    EKG EKG Interpretation  Date/Time:  Saturday May 19 2018 08:25:47 EST Ventricular Rate:  137 PR Interval:    QRS Duration: 86 QT Interval:  313 QTC Calculation: 473 R Axis:   27 Text Interpretation:  Atrial flutter with predominant 2:1 AV block Ventricular premature complex Anterior infarct, old Baseline wander in lead(s) V2 Confirmed by Lennice Sites [phone]) on 05/19/2018 8:34:40 AM   Radiology No results found.  Procedures .Critical Care Performed by: Margarita Mail, PA-C Authorized by: Margarita Mail, PA-C   Critical care provider statement:    Critical care time (minutes):  60   Critical care was necessary to treat or prevent imminent or life-threatening deterioration of the following conditions:  Cardiac failure   Critical care was time spent personally by me on the following activities:  Discussions with consultants, evaluation of patient's response to treatment, examination of patient, ordering and performing treatments and interventions, ordering and review of laboratory studies, ordering and review of radiographic studies, pulse oximetry, re-evaluation of patient's condition, obtaining history from patient or surrogate and review of old charts   (including critical care time)  Medications Ordered in ED Medications - No data to display  CHA2DS2/VAS Stroke Risk Points  Current as of 31 minutes ago     2 >= 2 Points:  High Risk  1 - 1.99 Points: Medium Risk  0 Points: Low Risk    This is the only CHA2DS2/VAS Stroke Risk Points available for the past  year.:  Last Change: N/A     Details    This score determines the patient's risk of having a stroke if the  patient has atrial fibrillation.       Points Metrics  1 Has Congestive Heart Failure:  Yes    Current as of 31 minutes ago  0 Has Vascular Disease:  No    Current as of 31 minutes ago  1 Has Hypertension:  Yes    Current as of 31 minutes ago  0 Age:  73    Current as of 31 minutes ago  0 Has Diabetes:  No    Current as of 31 minutes ago  0 Had Stroke:  No  Had TIA:  No  Had thromboembolism:  No    Current as of 31 minutes ago  0 Male:  No    Current as of 31 minutes ago            Initial Impression / Assessment and Plan / ED Course  I have reviewed the triage vital signs and the nursing notes.  Pertinent labs & imaging results that were available during my care of the patient were reviewed by me and considered in my medical decision making (see chart for details).  Clinical Course as of May 19 1202  Sat May 19, 2018  1052 Pulse Rate(!): 126 [AH]  1133 Spoke with Dr. Jonnie Finner about need for dialysis inpatient   [AH]    Clinical Course User Index [AH] Margarita Mail, PA-C    Patient in A. fib/flutter.  The patient has had persistent rapid ventricular rate despite intervention.  His rate control was complicated by the fact that he was also hypotensive.  I spoke with Dr. Oval Linsey who recommended amiodarone and DC of the Cardizem.  Patient will be admitted to the hospitalist service.  I spoke with Dr. sure to make sure he is dialyzed appropriately.  He is not having any chest pain.  Patient's labs are otherwise at baseline without hyperkalemia.  Final Clinical Impressions(s) / ED Diagnoses   Final diagnoses:  Atrial fibrillation with RVR (HCC)  Lightheadedness  Hypotension, unspecified hypotension type  ESRD (end stage renal  disease) on dialysis [facility])    ED Discharge Orders         Ordered    Amb referral to AFIB Clinic     05/19/18 0830           Margarita Mail, PA-C 05/19/18 1726    Lennice Sites, DO 05/20/18 [phone]

## 2018-05-19 NOTE — ED Provider Notes (Signed)
Medical screening examination/treatment/procedure(s) were conducted as a shared visit with non-physician practitioner(s) and myself.  I personally evaluated the patient during the encounter. Briefly, the patient is a 49 y.o. male with history of end-stage renal disease, atrial fibrillation on Coumadin, chronic dissection of aorta who presents the ED with palpitations, chest pain.  Patient with atrial fibrillation with RVR on EKG.  Otherwise normal vitals.  No fever.  Patient is due for dialysis today.  Took his home labetalol this morning but no relief.  He states that he felt like he has been in and out of A. fib for the last week or so.  States that he is not always compliant with his Coumadin.  His last several INRs have been subtherapeutic.  Patient with good pulses throughout.  He has been told that he is not a surgical candidate for his dissection.  Has paralysis in his lower extremity secondary to dissection.  Overall is well-appearing.  Will start diltiazem bolus and drip for atrial fibrillation with RVR and check labs including troponin.  Patient is a poor cardioversion candidate due to recent noncompliance with Coumadin.  Lab work overall unremarkable.  Creatinine at baseline.  No significant electrolyte abnormality.  Troponin within normal limits.  Heart rate has improved on diltiazem drip.  Will admit for further care afib w. rvr care, INR at goal today.  Nephrology to be consulted as patient will need dialysis.  Hemodynamically stable throughout my care.   EKG Interpretation  Date/Time:  Saturday May 19 2018 08:25:47 EST Ventricular Rate:  137 PR Interval:    QRS Duration: 86 QT Interval:  313 QTC Calculation: 473 R Axis:   27 Text Interpretation:  Atrial flutter with predominant 2:1 AV block Ventricular premature complex Anterior infarct, old Baseline wander in lead(s) V2 Confirmed by Lennice Sites [phone]) on 05/19/2018 8:34:40 AM           Lennice Sites, DO 05/19/18  1106

## 2018-05-19 NOTE — ED Notes (Signed)
Critical I-stat EG7+ results given to [place], PA via face to face   PO2 -38

## 2018-05-19 NOTE — Consult Note (Addendum)
Cardiology Consultation:   Patient ID: Corey [name]; [id number]; 10/11/1970   Admit date: 05/19/2018 Date of Consult: 05/19/2018  Primary Care Provider: Clent Demark, PA-C Primary Cardiologist: [name]   Patient Profile:   Corey [name] is a 48 y.o. male with a hx of PAF on Coumadin s/p DCCV in 2014, type B aortic dissection with original aortic dissection in 2004 with extensive repair in [place] complicated by ESRD on HD (TTS) since 2004 and lower extremity paralysis, pneumothorax and "extensive coma for 3 months" with reported hospitalization for 2 years, visual impairment, HTN, and headache disorder who is being seen today for the evaluation of Afib/flutter with RVR at the request of Dr. Ronnald Nian.  History of Present Illness:   Corey [name] has previously vividly described his aortic dissection stating, he was cleaning his bowl after dinner and felt a pop in his back which was excruciating. He was most recently admitted to the hospital in 2018 with chest pain and minimal troponin elevated. CT scan showed a stable type B aortic dissection originating distally from the left subclavian. Conservative management was advised. Post hospitalization, he had a repeat CT in 02/2017 that showed a stable type B dissection. He was last seen in the office in 07/2017 and was doing well, maintaining sinus rhythm. He was referred to CVTS for following of his dissection, though was a no show. More recently, he has been noted to be overdue for his INR checks in 04/2018. Outpatient INR from 05/09/2018 noted to be subtherapeutic at 1.4.   He presented to the ED this morning after noting sudden onset of tachypalpitations at 5 AM. He reported frequently forgetting to take his Coumadin to the ED. He also noted an increase in tachypalpitation burden with symptoms usually lasting 15-30 minutes. However, this morning his symptoms persisted, prompting him to come to the ED where he was found to be in Afib/flutter with RVR.   His  last HD session was 05/17/2018, though it has been noted he does intermittently miss treatment sessions, last missed HD on 2/11 secondary his sister's funeral.   Labs: INR 2.69, WBC 8.0, HGB 13.4, PLT 145, TSH 1.088, magnesium 1.9, potassium 3.9, SCr 10.85.  Imaging: CXR diffuse interstitial thickening felt to represent a degree of fibrosis and chronic inflammatory change without frank edema or consolidation.   Telemetry: When he was initially placed on telemetry he was in sinus rhythm at 5:30 AM. Around 6 AM he developed Afib with RVR followed by atrial flutter with RVR around 8:30 AM, followed Afib with RVR around 10 AM and more recently atrial flutter with RVR with ventricular rates in the 130s bpm.  He was started on an IV Cardizem gtt without significant improvement in his ventricular rates. BP has been in the low 229N to 98X systolic which has precluded titration of his Cardizem. He has IV amiodarone ordered. He remains in atrial flutter with RVR with ventricular rates in the 130s bpm currently. He denies any recent fevers, chills, nausea, vomiting, or diarrhea.   Past Medical History:  Diagnosis Date  . Arthritis    hands and shoulders  . Blindness and low vision    "Stargardt disease"  . Dissection of aorta ([facility]) 2004   a. s/p extensive repeair in 2119 in [place] complicated by ESRD, lower extremity paralysis, coma, and extended hospitalization of 2 years  . ESRD (end stage renal disease) ([facility])    a. TTS  . Headache   . History of cardioversion 2014  .  Hypertension   . Neuropathy   . Non-healing non-surgical wound 03/2016  . PAF (paroxysmal atrial fibrillation) ([facility])    a. s/p DCCV in 2014; b. on Coumadin; c. CHADS2VASc => 2 (HTN, vascular disease)  . Paralysis ([facility])    due to dissection of aorta in 2004, lower extremities  . Pneumonia     Past Surgical History:  Procedure Laterality Date  . APPLICATION OF WOUND VAC Left 04/13/2016   Procedure: APPLICATION OF WOUND VAC;   Surgeon: Conrad Robin [place], MD;  Location: [facility];  Service: Vascular;  Laterality: Left;  . APPLICATION OF WOUND VAC Left 04/18/2016   Procedure: APPLICATION OF WOUND VAC;  Surgeon: Waynetta Sandy, MD;  Location: [facility];  Service: Vascular;  Laterality: Left;  Wound vac change   . APPLICATION OF WOUND VAC Left 04/20/2016   Procedure: WOUND VAC CHANGE;  Surgeon: Conrad Playita, MD;  Location: [facility];  Service: Vascular;  Laterality: Left;  . AV FISTULA PLACEMENT    . BASCILIC VEIN TRANSPOSITION Left 12/09/2015   Procedure: FIRST STAGE BASILIC VEIN TRANSPOSITION LEFT UPPER ARM;  Surgeon: Conrad Splendora, MD;  Location: [facility];  Service: Vascular;  Laterality: Left;  . BASCILIC VEIN TRANSPOSITION Left 03/09/2016   Procedure: SECOND STAGE BASILIC VEIN TRANSPOSITION WITH REVISION OF ANASTOMOSIS LEFT UPPER ARM;  Surgeon: Conrad Hookstown, MD;  Location: [facility];  Service: Vascular;  Laterality: Left;  . CARDIOVERSION    . REPAIR OF ACUTE ASCENDING THORACIC AORTIC DISSECTION    . REVISON OF ARTERIOVENOUS FISTULA Left 04/20/2016   Procedure: LIGATION OF BASILIC VEIN TRANSPOSITION;  Surgeon: Conrad Lima, MD;  Location: [facility];  Service: Vascular;  Laterality: Left;  . WOUND DEBRIDEMENT Left 04/13/2016   Procedure: DEBRIDEMENT WOUND;  Surgeon: Conrad , MD;  Location: [facility];  Service: Vascular;  Laterality: Left;     Home Meds: Prior to Admission medications   Medication Sig Start Date End Date Taking? Authorizing Provider  labetalol (NORMODYNE) 100 MG tablet Take 1 tablet (100 mg total) by mouth 2 (two) times daily. 04/19/17  Yes Clent Demark, PA-C  Multiple Vitamins-Minerals (MULTIVITAMIN WITH MINERALS) tablet Take 1 tablet by mouth daily.   Yes [provider]  sevelamer carbonate (RENVELA) 0.8 g PACK packet Take 1.6 g by mouth 3 (three) times daily with meals. 01/26/17  Yes Allie Bossier, MD  warfarin (COUMADIN) 5 MG tablet Take as directed by coumadin clinic Patient taking differently: Take  5-7.5 mg by mouth daily at 6 PM. 7.5 mg every day except Monday & Friday patient takes 5 mg 08/21/17  Yes Jerline Pain, MD  cinacalcet (SENSIPAR) 30 MG tablet Take 2 tablets (60 mg total) by mouth daily with supper. Patient not taking: Reported on 05/19/2018 01/26/17   Allie Bossier, MD    Inpatient Medications: Scheduled Meds: . amiodarone  150 mg Intravenous Once   Continuous Infusions: . amiodarone     Followed by  . amiodarone    . diltiazem (CARDIZEM) infusion 5 mg/hr (05/19/18 5027)   PRN Meds:   Allergies:   Allergies  Allergen Reactions  . Ciprofloxacin Other (See Comments)    Aortic dissection  . Heparin Other (See Comments)    UNSPECIFIED REACTION :  On Coumadin since 2004   HIT panel negative 01/19/17    Social History:   Social History   Socioeconomic History  . Marital status: Divorced    Spouse name: Not on file  . Number  of children: 2  . Years of education: Not on file  . Highest education level: Not on file  Occupational History  . Occupation: DIABLED  Social Needs  . Financial resource strain: Not on file  . Food insecurity:    Worry: Not on file    Inability: Not on file  . Transportation needs:    Medical: Not on file    Non-medical: Not on file  Tobacco Use  . Smoking status: Never Smoker  . Smokeless tobacco: Never Used  Substance and Sexual Activity  . Alcohol use: No  . Drug use: No  . Sexual activity: Not on file  Lifestyle  . Physical activity:    Days per week: Not on file    Minutes per session: Not on file  . Stress: Not on file  Relationships  . Social connections:    Talks on phone: Not on file    Gets together: Not on file    Attends religious service: Not on file    Active member of club or organization: Not on file    Attends meetings of clubs or organizations: Not on file    Relationship status: Not on file  . Intimate partner violence:    Fear of current or ex partner: Not on file    Emotionally abused: Not  on file    Physically abused: Not on file    Forced sexual activity: Not on file  Other Topics Concern  . Not on file  Social History Narrative  . Not on file     Family History:   Family History  Problem Relation Age of Onset  . Cancer Mother   . Hypertension Mother   . Cancer Father   . Hypertension Father   . Thyroid disease Sister   . Stroke Brother        11    ROS:  Review of Systems  Constitutional: Positive for malaise/fatigue. Negative for chills, diaphoresis, fever and weight loss.  HENT: Negative for congestion.   Eyes: Negative for discharge and redness.  Respiratory: Negative for cough, hemoptysis, sputum production, shortness of breath and wheezing.   Cardiovascular: Positive for palpitations. Negative for chest pain, orthopnea, claudication, leg swelling and PND.  Gastrointestinal: Negative for abdominal pain, blood in stool, constipation, diarrhea, heartburn, melena, nausea and vomiting.  Genitourinary: Negative for hematuria.  Musculoskeletal: Negative for falls and myalgias.  Skin: Negative for rash.  Neurological: Positive for weakness. Negative for dizziness, tingling, tremors, sensory change, speech change, focal weakness and loss of consciousness.  Endo/Heme/Allergies: Does not bruise/bleed easily.  Psychiatric/Behavioral: Negative for substance abuse. The patient is not nervous/anxious.   All other systems reviewed and are negative.     Physical Exam/Data:   Vitals:   05/19/18 1056 05/19/18 1100 05/19/18 1115 05/19/18 1215  BP:  104/63 93/71 97/68   Pulse:   (!) 140 (!) 137  Resp: 18 20 18 16   SpO2:   95% 95%  Weight:      Height:       No intake or output data in the 24 hours ending 05/19/18 1228 Filed Weights   05/19/18 0844  Weight: 85 kg   Body mass index is 24.06 kg/m.   Physical Exam: General: Well developed, well nourished, in no acute distress. Head: Normocephalic, atraumatic, sclera non-icteric and injected, no xanthomas, nares  without discharge.  Neck: Negative for carotid bruits. JVD not elevated. Lungs: Clear bilaterally to auscultation without wheezes, rales, or rhonchi. Breathing is unlabored. Heart: Tachycardic  with S1 S2. No murmurs, rubs, or gallops appreciated. Abdomen: Soft, non-tender, non-distended with normoactive bowel sounds. No hepatomegaly. No rebound/guarding. No obvious abdominal masses. Msk:  Strength and tone appear normal for age. Extremities: No clubbing or cyanosis. No edema. Distal pedal pulses are 2+ and equal bilaterally. Neuro: Alert and oriented X 3. No facial asymmetry. No focal deficit. Moves all extremities spontaneously. Psych:  Responds to questions appropriately with a normal affect.   EKG:  The EKG was personally reviewed and demonstrates: 8:25 AM - atrial flutter with RVR with 2:1 AV block, 137 bpm, rare isolated PVC, baseline wandering along V2, nonspecific st/t changes Telemetry:  Telemetry was personally reviewed and demonstrates: When he was initially placed on telemetry he was in sinus rhythm at 5:30 AM. Around 6 AM he developed Afib with RVR followed by atrial flutter with RVR around 8:30 AM, followed Afib with RVR around 10 AM and more recently atrial flutter with RVR with ventricular rates in the 130s bpm.  Weights: Filed Weights   05/19/18 0844  Weight: 85 kg    Relevant CV Studies: Outpatient cardiac monitoring 09/2016: Study Highlights     Paroxysmal Atrial fibrillation present (2%) with occasional rapid ventricular response (RVR). Majortiy of AFIB is just above 100 BPM  No pauses > 3 seconds  Majority of tracings are normal sinus rhythm (98%)   Continue current therapy. On anticoagulation.  __________  Echo 01/2017: Study Conclusions  - Left ventricle: The cavity size was normal. There was mild   concentric hypertrophy. Systolic function was vigorous. The   estimated ejection fraction was in the range of 65% to 70%. Wall   motion was normal; there  were no regional wall motion   abnormalities. Features are consistent with a pseudonormal left   ventricular filling pattern, with concomitant abnormal relaxation   and increased filling pressure (grade 2 diastolic dysfunction).   Doppler parameters are consistent with elevated ventricular   end-diastolic filling pressure. - Aortic root: The aortic root was normal in size. - Ascending aorta: The ascending aorta was normal in size. - Mitral valve: Calcified annulus. Mildly thickened leaflets .   There was mild regurgitation. - Left atrium: The atrium was mildly dilated. - Right ventricle: The cavity size was moderately dilated. Wall   thickness was normal. Systolic function was normal. - Right atrium: The atrium was moderately dilated. - Pulmonary arteries: Systolic pressure was moderately increased.   PA peak pressure: 55 mm Hg (S). - Inferior vena cava: The vessel was dilated. The respirophasic   diameter changes were blunted (< 50%), consistent with elevated   central venous pressure. - Pericardium, extracardiac: There was no pericardial effusion.   There was a left pleural effusion.  Laboratory Data:  Chemistry Recent Labs  Lab 05/19/18 0833 05/19/18 0847  NA 141 140  K 3.9 3.8  CL 101  --   CO2 22  --   GLUCOSE 96  --   BUN 35*  --   CREATININE 10.85* 11.50*  CALCIUM 9.0  --   GFRNONAA 5*  --   GFRAA 6*  --   ANIONGAP 18*  --     No results for input(s): PROT, ALBUMIN, AST, ALT, ALKPHOS, BILITOT in the last 168 hours. Hematology Recent Labs  Lab 05/19/18 0833 05/19/18 0847  WBC 8.0  --   RBC 4.27  --   HGB 13.4 13.6  HCT 42.7 40.0  MCV 100.0  --   MCH 31.4  --   MCHC 31.4  --  RDW 14.8  --   PLT 145*  --    Cardiac EnzymesNo results for input(s): TROPONINI in the last 168 hours. No results for input(s): TROPIPOC in the last 168 hours.  BNPNo results for input(s): BNP, PROBNP in the last 168 hours.  DDimer No results for input(s): DDIMER in the last 168  hours.  Radiology/Studies:  Dg Chest Port 1 View  Result Date: 05/19/2018 IMPRESSION: Diffuse interstitial thickening likely reflects a degree of underlying fibrosis and chronic inflammatory type change. There is scarring in the right base. There is no frank edema or consolidation. Cardiac silhouette is stable. Central catheter tip at cavoatrial junction. No adenopathy evident. Electronically Signed   By: Lowella Grip III M.D.   On: 05/19/2018 09:04    Assessment and Plan:   1. PAF/flutter: -He has intermittently been in sinus rhythm, Afib, and more recently atrial flutter with RVR while in the ED with ventricular rates in the 130s bpm -Relative hypotension has precluded titration of Cardizem gtt with him remaining at 5 mg/hr  -Rate controlling medications are somewhat limited in that we cannot titrate Cardizem or add metoprolol secondary to hypotension. We cannot use digoxin secondary to his ESRD -He BP is soft, though he is stable and asymptomatic, and at this time does not need to undergo emergent DCCV. I do have concerns that a potential DCCV at this time would not be successful long term given his recent history of tachypalpitations over the past month and documented sinus rhythm on telemetry with redevelopment of arrhythmia this morning -IV amiodarone is ordered for rate control and this appears to be our best option at this time, however he was noted to have a subtherapeutic INR on 05/09/2018 -We may need to maintain him on a low-dose PO amiodarone in an effort to maintain him in sinus rhythm  -Thyroid function is normal -Potassium 3.9 and will be repleted in HD -Magnesium 1.9 -Check echo once his heart rate has improved -If his ventricular rates prove to be difficult to control, he may require TEE/DCCV prior to discharge -Continue Coumadin  -No symptoms suggestive of angina or ACS, cycle troponin   2. Type B aortic dissection: -Most recent CT from 02/2017 showed stable  anatomy -He was referred to CVTS, though did not show for his appointment  -Consider repeat imaging while admitted -Avoid fluoroquinolones and vagal maneuvers -Optimal BP control     3. ESRD: -On HD  4. HTN: -BP soft as above   For questions or updates, please contact [place] Please consult [email or web address] for contact info under Cardiology/STEMI.   Signed, Christell Faith, PA-C [place] Pager: [phone] 05/19/2018, 12:28 PM  Patient seen with PA, agree with the above note.   He has a history of PAF and type B aortic dissection as well as ESRD.  He has had frequent symptomatic tachypalpitations at home, now having more prolonged episodes so came to the ER.  He is on warfarin at home, currently INR therapeutic but has been low in recent past.   In the ER, he has been in NSR, atrial fibrillation, and atrial flutter.  Last echo from 2018 showed normal EF.  No chest pain.   On exam, JVP 8 cm.  Irregular S2S2, no murmur.  Clear lungs.  Trace ankle edema.   Patient is in and out of atrial fibrillation and flutter.  He is very symptomatic with episodes.  I do not think that he will stay in normal rhythm without an anti-arrhythmic medication.  Given ESRD, our options are limited.  - Will start amiodarone gtt for rate control for now.  If he remains in atrial fibrillation, will need TEE-guided DCCV.  - Continue warfarin.  - I will arrange repeat echo.   Will need nephrology consult with ESRD.   Loralie Champagne 05/19/2018 1:22 PM

## 2018-05-19 NOTE — Progress Notes (Signed)
ANTICOAGULATION CONSULT NOTE - Initial Consult  Pharmacy Consult for warfarin Indication: atrial fibrillation   Patient Measurements: Height: 6\' 2"  (188 cm) Weight: 187 lb 6.3 oz (85 kg) IBW/kg (Calculated) : 82.2   Vital Signs: BP: 93/65 (02/15 1330) Pulse Rate: 86 (02/15 1330)  Labs: Recent Labs    05/19/18 0833 05/19/18 0847  HGB 13.4 13.6  HCT 42.7 40.0  PLT 145*  --   LABPROT 28.2*  --   INR 2.69  --   CREATININE 10.85* 11.50*     Medical History: Past Medical History:  Diagnosis Date  . Arthritis    hands and shoulders  . Blindness and low vision    "Stargardt disease"  . Dissection of aorta ([facility]) 2004   a. s/p extensive repeair in [address]062 in [place] complicated by ESRD, lower extremity paralysis, coma, and extended hospitalization of 2 years  . ESRD (end stage renal disease) ([facility])    a. TTS  . Headache   . History of cardioversion 2014  . Hypertension   . Neuropathy   . Non-healing non-surgical wound 03/2016  . PAF (paroxysmal atrial fibrillation) ([facility])    a. s/p DCCV in 2014; b. on Coumadin; c. CHADS2VASc => 2 (HTN, vascular disease)  . Paralysis ([facility])    due to dissection of aorta in 2004, lower extremities  . Pneumonia     Assessment: AFib on warfarin prior to admission, admitted with palpitations. Warfarin dose PTA: 5 mg Mon/Fri, 7.5 mg on all other days Current INR 2.69  Goal of Therapy:  INR 2-3 Monitor platelets by anticoagulation protocol: Yes    Plan:  -warfarin 7.5 mg po x1 -daily INR   Harvel Quale 05/19/2018,1:50 PM

## 2018-05-19 NOTE — H&P (Addendum)
History and Physical    DOA: 05/19/2018  PCP: Clent Demark, PA-C  Patient coming from: Home  Chief Complaint: Woke up at 5 AM with palpitations  HPI: Corey [name] is a 48 y.o. male with history h/o blindness/low vision, dissection of aorta, paroxysmal Afib, ESRD on HD TTS, HA, HTN, paralysis of lower extremities, calciphylaxis of LUE, Stargardt disease,diagnosed with acute (2019) on chronic(repaired in 2004,[place]) typeB thoracoabdominal aortic dissection, medically managed and who follows vascular surgery as well as cardiology as outpatient presents today with c/o palpitations that woke him up from sleep at 5 AM and noted to be in A fib with RVR, on arrival here, with heart rate 1 30-1 40.  He was initiated on Cardizem drip but has not had adequate response with persistent heart rate in 130s and soft blood pressure in systolic 53G limiting uptitration of Cardizem drip more than 10 mg/h.  ED physician discussed with cardiology and ordered IV amiodarone infusion, loading dose which nursing staff is in the process of initiating.  Patient still complaining of palpitations associated with some chest tightness but denies chest pain per se.  Denies dizziness or lightheadedness or sweating or nausea or vomiting.  He states he has a blood pressure monitor at home and usually is symptomatic when he has paroxysmal A. fib episodes.  These episodes usually occur once a month and last about 15 to 30 minutes with heart rate around 120s, usually aborted by taking extra dose of labetalol.  He however reports more frequent episodes in the last month.  He apparently did undergo cardioversion on one occasion in [place] but was never considered for ablation.  He denies any leg swellings more than usual.  He states usually he is compliant with medications and dialysis treatments although he did miss 1 session recently due to a funeral he had to attend.  His baseline blood pressures are apparently around systolic 644I but  after dialysis usually dropped to systolic 34V.  Denies history of smoking alcohol or drug abuse.   Review of Systems: As per HPI otherwise 10 point review of systems negative.    Past Medical History:  Diagnosis Date  . Arthritis    hands and shoulders  . Blindness and low vision    "Stargardt disease"  . Dissection of aorta ([place]) 2004   a. s/p extensive repeair in 4259 in [place] complicated by ESRD, lower extremity paralysis, coma, and extended hospitalization of 2 years  . ESRD (end stage renal disease) ([place])    a. TTS  . Headache   . History of cardioversion 2014  . Hypertension   . Neuropathy   . Non-healing non-surgical wound 03/2016  . PAF (paroxysmal atrial fibrillation) ([place])    a. s/p DCCV in 2014; b. on Coumadin; c. CHADS2VASc => 2 (HTN, vascular disease)  . Paralysis ([place])    due to dissection of aorta in 2004, lower extremities  . Pneumonia     Past Surgical History:  Procedure Laterality Date  . APPLICATION OF WOUND VAC Left 04/13/2016   Procedure: APPLICATION OF WOUND VAC;  Surgeon: Conrad Herrick, MD;  Location: [place];  Service: Vascular;  Laterality: Left;  . APPLICATION OF WOUND VAC Left 04/18/2016   Procedure: APPLICATION OF WOUND VAC;  Surgeon: Waynetta Sandy, MD;  Location: [place];  Service: Vascular;  Laterality: Left;  Wound vac change   . APPLICATION OF WOUND VAC Left 04/20/2016   Procedure: WOUND VAC CHANGE;  Surgeon: Conrad Franklin, MD;  [facility];  Service: Vascular;  Laterality: Left;  . AV FISTULA PLACEMENT    . BASCILIC VEIN TRANSPOSITION Left 12/09/2015   Procedure: FIRST STAGE BASILIC VEIN TRANSPOSITION LEFT UPPER ARM;  Surgeon: Conrad Canalou, MD;  Location: [place];  Service: Vascular;  Laterality: Left;  . BASCILIC VEIN TRANSPOSITION Left 03/09/2016   Procedure: SECOND STAGE BASILIC VEIN TRANSPOSITION WITH REVISION OF ANASTOMOSIS LEFT UPPER ARM;  Surgeon: Conrad [place], MD;  Location: [place];  Service: Vascular;  Laterality: Left;  .  CARDIOVERSION    . REPAIR OF ACUTE ASCENDING THORACIC AORTIC DISSECTION    . REVISON OF ARTERIOVENOUS FISTULA Left 04/20/2016   Procedure: LIGATION OF BASILIC VEIN TRANSPOSITION;  Surgeon: Conrad Dodge, MD;  Location: [place];  Service: Vascular;  Laterality: Left;  . WOUND DEBRIDEMENT Left 04/13/2016   Procedure: DEBRIDEMENT WOUND;  Surgeon: Conrad Tyro, MD;  Location: [place];  Service: Vascular;  Laterality: Left;    Social history:  reports that he has never smoked. He has never used smokeless tobacco. He reports that he does not drink alcohol or use drugs.   Allergies  Allergen Reactions  . Ciprofloxacin Other (See Comments)    Aortic dissection  . Heparin Other (See Comments)    UNSPECIFIED REACTION :  On Coumadin since 2004   HIT panel negative 01/19/17    Family History  Problem Relation Age of Onset  . Cancer Mother   . Hypertension Mother   . Cancer Father   . Hypertension Father   . Thyroid disease Sister   . Stroke Brother        11      Prior to Admission medications   Medication Sig Start Date End Date Taking? Authorizing Provider  labetalol (NORMODYNE) 100 MG tablet Take 1 tablet (100 mg total) by mouth 2 (two) times daily. 04/19/17  Yes Clent Demark, PA-C  Multiple Vitamins-Minerals (MULTIVITAMIN WITH MINERALS) tablet Take 1 tablet by mouth daily.   Yes [provider]  sevelamer carbonate (RENVELA) 0.8 g PACK packet Take 1.6 g by mouth 3 (three) times daily with meals. 01/26/17  Yes Allie Bossier, MD  warfarin (COUMADIN) 5 MG tablet Take as directed by coumadin clinic Patient taking differently: Take 5-7.5 mg by mouth daily at 6 PM. 7.5 mg every day except Monday & Friday patient takes 5 mg 08/21/17  Yes Jerline Pain, MD  cinacalcet (SENSIPAR) 30 MG tablet Take 2 tablets (60 mg total) by mouth daily with supper. Patient not taking: Reported on 05/19/2018 01/26/17   Allie Bossier, MD    Physical Exam: Vitals:   05/19/18 1056 05/19/18 1100  05/19/18 1115 05/19/18 1215  BP:  104/63 93/71 97/68   Pulse:   (!) 140 (!) 137  Resp: 18 20 18 16   SpO2:   95% 95%  Weight:      Height:        Constitutional: NAD, calm, comfortable Eyes: PERRL, lids and conjunctivae injected ENMT: Mucous membranes are moist. Posterior pharynx clear of any exudate or lesions.Normal dentition.  Neck: normal, supple, no masses, no thyromegaly Respiratory: clear to auscultation bilaterally, no wheezing, no crackles. Normal respiratory effort. No accessory muscle use.  Cardiovascular: Irregular rate and rhythm, no murmurs / rubs / gallops. No extremity edema. 2+ pedal pulses. No carotid bruits.  Abdomen: no tenderness, no masses palpated. No hepatosplenomegaly. Bowel sounds positive.  Musculoskeletal: no clubbing / cyanosis. No joint deformity upper and lower extremities. Good ROM, no  contractures. Normal muscle tone.  Neurologic: CN 2-12 grossly intact.  Hypersensitive to touch in lower extremities due to paresthesias and neuropathy, DTR normal. Strength 4/5 in lower extremities and 5/5 in upper extremities.  Psychiatric: Normal judgment and insight. Alert and oriented x 3. Normal mood.    Labs on Admission: I have personally reviewed following labs and imaging studies  CBC: Recent Labs  Lab 05/19/18 0833 05/19/18 0847  WBC 8.0  --   HGB 13.4 13.6  HCT 42.7 40.0  MCV 100.0  --   PLT 145*  --    Basic Metabolic Panel: Recent Labs  Lab 05/19/18 0833 05/19/18 0847  NA 141 140  K 3.9 3.8  CL 101  --   CO2 22  --   GLUCOSE 96  --   BUN 35*  --   CREATININE 10.85* 11.50*  CALCIUM 9.0  --   MG 1.9  --    GFR: Estimated Creatinine Clearance: 9.2 mL/min (A) (by C-G formula based on SCr of 11.5 mg/dL (H)). Liver Function Tests: No results for input(s): AST, ALT, ALKPHOS, BILITOT, PROT, ALBUMIN in the last 168 hours. No results for input(s): LIPASE, AMYLASE in the last 168 hours. No results for input(s): AMMONIA in the last 168  hours. Coagulation Profile: Recent Labs  Lab 05/19/18 0833  INR 2.69   Cardiac Enzymes: No results for input(s): CKTOTAL, CKMB, CKMBINDEX, TROPONINI in the last 168 hours. BNP (last 3 results) No results for input(s): PROBNP in the last 8760 hours. HbA1C: No results for input(s): HGBA1C in the last 72 hours. CBG: Recent Labs  Lab 05/19/18 0905  GLUCAP 92   Lipid Profile: No results for input(s): CHOL, HDL, LDLCALC, TRIG, CHOLHDL, LDLDIRECT in the last 72 hours. Thyroid Function Tests: Recent Labs    05/19/18 0830  TSH 1.088   Anemia Panel: No results for input(s): VITAMINB12, FOLATE, FERRITIN, TIBC, IRON, RETICCTPCT in the last 72 hours. Urine analysis: No results found for: COLORURINE, APPEARANCEUR, LABSPEC, PHURINE, GLUCOSEU, HGBUR, BILIRUBINUR, KETONESUR, PROTEINUR, UROBILINOGEN, NITRITE, LEUKOCYTESUR  Radiological Exams on Admission: Dg Chest Port 1 View  Result Date: 05/19/2018 CLINICAL DATA:  Chest tightness and cardiac palpitations EXAM: PORTABLE CHEST 1 VIEW COMPARISON:  Chest radiograph January 21, 2017 and chest CT March 01, 2017 FINDINGS: There is scarring in the right base which is stable. There is diffuse interstitial thickening bilaterally, also stable. There is no airspace consolidation. Heart size and pulmonary vascularity are within normal limits. No adenopathy. Central catheter tip is at the cavoatrial junction. No bone lesions. IMPRESSION: Diffuse interstitial thickening likely reflects a degree of underlying fibrosis and chronic inflammatory type change. There is scarring in the right base. There is no frank edema or consolidation. Cardiac silhouette is stable. Central catheter tip at cavoatrial junction. No adenopathy evident. Electronically Signed   By: Lowella Grip III M.D.   On: 05/19/2018 09:04    EKG: Independently reviewed.  A. fib with RVR     Assessment and Plan:   1.  Paroxysmal atrial flutter/fibrillation: Patient states usually  episodes last 15 to 30 minutes.  He did not respond to Cardizem drip and being transitioned to amnio drip currently.  Appreciate cardiology evaluation and recommendations.  Per cardiology note, patient not a candidate for digoxin due to end-stage renal disease and may not benefit much from DCCV.  His INR was subtherapeutic on February 5 but appears to be therapeutic on labs today.  Continue Coumadin at 7.5 mg alternating with 5 mg dosing.  Will resume oral labetalol with holding parameters for blood pressure and heart rate.  Last echo in 2018 showed EF 65 to 70% and grade 2 diastolic dysfunction.  Repeat echo.Thyroid function, troponins within normal limits.  Will admit to stepdown unit for now with close monitoring but if patient does not respond to amnio drip or deteriorates further hemodynamically, will consider transferring to ICU.  TEE/DC cardioversion and evaluation for AV ablation deferred to cardiology.  2.  End-stage renal disease: Nephrology has been consulted and patient on Tuesday Thursday Saturday schedule.  Electrolytes seem okay except for low ionized calcium.  Resume home medications but will hold Sensipar for now. Replace magnesium to keep level >2.   3.  Type B aortic dissection: Status post extensive repair in 2004 in [place] with complications of end-stage renal disease, comatose state, prolonged hospitalization, lower extremity paralysis.  Patient now walks with a walker.  Resume labetalol.  Follows vascular surgery as outpatient.  Cardiology recommends considering follow-up CT while here, possibly when more stable.  4.  Peripheral neuropathy: Patient stopped taking gabapentin due to intolerance.  5.  Calciphylaxis: Chronic issue.  Poor prognosis  DVT prophylaxis: On chronic anticoagulation  Code Status: Full code as confirmed with patient  Family Communication: Discussed with patient. Health care proxy would be his brother Christia Reading Consults called: Cardiology and  nephrology Admission status:  Patient admitted as inpatient to stepdown unit as anticipated LOS greater than 2 midnights.    Guilford Shi MD [facility] Pager [phone]  If 7PM-7AM, please contact night-coverage [email or web address] Password [facility]  05/19/2018, 12:35 PM

## 2018-05-19 NOTE — ED Notes (Signed)
Dr. Ronnald Nian notified by this RN of patient's heart rate and blood pressure. Instructed not to titrate cardizem drip at this time.

## 2018-05-19 NOTE — Consult Note (Addendum)
[facility] Renal Consultation Note    Indication for Consultation:  Management of ESRD/hemodialysis; anemia, hypertension/volume and secondary hyperparathyroidism   HPI: Corey [name] is a 48 y.o. male with ESRD on HD since 2004.  PMH significant for HTN, chronic aortic dissection (acute on chronic dissection in 01/2017- not felt to be a candidate for surgical repair),  atrial fibrillation, prior cardioversion, on Coumadin, hx calciphylaxis.   Presented ED with palpitations/weakness/dizziness. Found to be in Afib with RVR up to 130s. He will be admitted for further management.  Cardiology consulted and cardizem and amiodarone gtt started in ED.  CXR: No frank edema or consolidation  Labs: Na 140 K 3.8 BUN 35 Cr 10.85 WBC 8.0 Hgb 13.4 INR 2.69  Seen in ED and alert and starting feel a little better. AFib on monitor with HR 90s. BP 94/72. Still feels some palpitations but denies CP, SOB, N/V/D.   Due for routine HD today. [place]. Last HD 2/13 but does miss treatments intermittently. Missed treatment Tuesday to attend sister's funeral.   Past Medical History:  Diagnosis Date  . A-fib ([place])   . Arthritis    hands and shoulders  . Blindness and low vision    "Stargardt disease"  . Dissection of aorta ([place]) 2004  . Dysrhythmia    A-fib  . ESRD (end stage renal disease) ([place])   . Headache   . History of cardioversion 2014  . Hypertension   . Neuropathy   . Non-healing non-surgical wound 03/2016  . Paralysis ([place])    due to dissection of aorta in 2004, lower extremities  . Pneumonia    Past Surgical History:  Procedure Laterality Date  . APPLICATION OF WOUND VAC Left 04/13/2016   Procedure: APPLICATION OF WOUND VAC;  Surgeon: Conrad [place], MD;  Location: [place];  Service: Vascular;  Laterality: Left;  . APPLICATION OF WOUND VAC Left 04/18/2016   Procedure: APPLICATION OF WOUND VAC;  Surgeon: Waynetta Sandy, MD;  Location: [place];   Service: Vascular;  Laterality: Left;  Wound vac change   . APPLICATION OF WOUND VAC Left 04/20/2016   Procedure: WOUND VAC CHANGE;  Surgeon: Conrad Malverne, MD;  Location: [place];  Service: Vascular;  Laterality: Left;  . AV FISTULA PLACEMENT    . BASCILIC VEIN TRANSPOSITION Left 12/09/2015   Procedure: FIRST STAGE BASILIC VEIN TRANSPOSITION LEFT UPPER ARM;  Surgeon: Conrad [place], MD;  Location: [place];  Service: Vascular;  Laterality: Left;  . BASCILIC VEIN TRANSPOSITION Left 03/09/2016   Procedure: SECOND STAGE BASILIC VEIN TRANSPOSITION WITH REVISION OF ANASTOMOSIS LEFT UPPER ARM;  Surgeon: Conrad [place], MD;  Location: [place];  Service: Vascular;  Laterality: Left;  . CARDIOVERSION    . REPAIR OF ACUTE ASCENDING THORACIC AORTIC DISSECTION    . REVISON OF ARTERIOVENOUS FISTULA Left 04/20/2016   Procedure: LIGATION OF BASILIC VEIN TRANSPOSITION;  Surgeon: Conrad Bamberg, MD;  Location: [place];  Service: Vascular;  Laterality: Left;  . WOUND DEBRIDEMENT Left 04/13/2016   Procedure: DEBRIDEMENT WOUND;  Surgeon: Conrad [place], MD;  Location: [place];  Service: Vascular;  Laterality: Left;   Family History  Problem Relation Age of Onset  . Cancer Mother   . Hypertension Mother   . Cancer Father   . Hypertension Father   . Thyroid disease Sister   . Stroke Brother        46   Social History:  reports that he  has never smoked. He has never used smokeless tobacco. He reports that he does not drink alcohol or use drugs. Allergies  Allergen Reactions  . Ciprofloxacin Other (See Comments)    Aortic dissection  . Heparin Other (See Comments)    UNSPECIFIED REACTION :  On Coumadin since 2004   HIT panel negative 01/19/17   Prior to Admission medications   Medication Sig Start Date End Date Taking? Authorizing Provider  labetalol (NORMODYNE) 100 MG tablet Take 1 tablet (100 mg total) by mouth 2 (two) times daily. 04/19/17  Yes Clent Demark, PA-C  Multiple Vitamins-Minerals (MULTIVITAMIN WITH  MINERALS) tablet Take 1 tablet by mouth daily.   Yes [provider]  sevelamer carbonate (RENVELA) 0.8 g PACK packet Take 1.6 g by mouth 3 (three) times daily with meals. 01/26/17  Yes Allie Bossier, MD  warfarin (COUMADIN) 5 MG tablet Take as directed by coumadin clinic Patient taking differently: Take 5-7.5 mg by mouth daily at 6 PM. 7.5 mg every day except Monday & Friday patient takes 5 mg 08/21/17  Yes Jerline Pain, MD  cinacalcet (SENSIPAR) 30 MG tablet Take 2 tablets (60 mg total) by mouth daily with supper. Patient not taking: Reported on 05/19/2018 01/26/17   Allie Bossier, MD   Current Facility-Administered Medications  Medication Dose Route Frequency Provider Last Rate Last Dose  . diltiazem (CARDIZEM) 100 mg in dextrose 5% 155mL (1 mg/mL) infusion  5-15 mg/hr Intravenous Continuous Margarita Mail, PA-C 5 mL/hr at 05/19/18 0938 5 mg/hr at 05/19/18 6734   Current Outpatient Medications  Medication Sig Dispense Refill  . labetalol (NORMODYNE) 100 MG tablet Take 1 tablet (100 mg total) by mouth 2 (two) times daily. 180 tablet 3  . Multiple Vitamins-Minerals (MULTIVITAMIN WITH MINERALS) tablet Take 1 tablet by mouth daily.    . sevelamer carbonate (RENVELA) 0.8 g PACK packet Take 1.6 g by mouth 3 (three) times daily with meals. 270 each 0  . warfarin (COUMADIN) 5 MG tablet Take as directed by coumadin clinic (Patient taking differently: Take 5-7.5 mg by mouth daily at 6 PM. 7.5 mg every day except Monday & Friday patient takes 5 mg) 50 tablet 0  . cinacalcet (SENSIPAR) 30 MG tablet Take 2 tablets (60 mg total) by mouth daily with supper. (Patient not taking: Reported on 05/19/2018) 60 tablet 0     ROS: As per HPI otherwise negative.  Physical Exam: Vitals:   05/19/18 1051 05/19/18 1056 05/19/18 1100 05/19/18 1115  BP: 98/70  104/63 93/71  Pulse: (!) 143   (!) 140  Resp: 14 18 20 18   SpO2: 96%   95%  Weight:      Height:         General: Ill appearing male WNWD  NAD  Head: NCAT sclera not icteric MMM Neck: Supple. +JVD  Lungs: CTA bilaterally without wheezes, rales, or rhonchi. Breathing is unlabored. Heart: Irregular rhythm Abdomen: soft NT + BS Lower extremities:  Trace LE edema;vascular skin changes  Neuro: A & O  X 3. Moves all extremities spontaneously. Psych:  Responds to questions appropriately with a normal affect. Dialysis Access: R [facility]   Labs: Basic Metabolic Panel: Recent Labs  Lab 05/19/18 0833 05/19/18 0847  NA 141 140  K 3.9 3.8  CL 101  --   CO2 22  --   GLUCOSE 96  --   BUN 35*  --   CREATININE 10.85* 11.50*  CALCIUM 9.0  --    Liver  Function Tests: No results for input(s): AST, ALT, ALKPHOS, BILITOT, PROT, ALBUMIN in the last 168 hours. No results for input(s): LIPASE, AMYLASE in the last 168 hours. No results for input(s): AMMONIA in the last 168 hours. CBC: Recent Labs  Lab 05/19/18 0833 05/19/18 0847  WBC 8.0  --   HGB 13.4 13.6  HCT 42.7 40.0  MCV 100.0  --   PLT 145*  --    Cardiac Enzymes: No results for input(s): CKTOTAL, CKMB, CKMBINDEX, TROPONINI in the last 168 hours. CBG: Recent Labs  Lab 05/19/18 0905  GLUCAP 92   Iron Studies: No results for input(s): IRON, TIBC, TRANSFERRIN, FERRITIN in the last 72 hours. Studies/Results: Dg Chest Port 1 View  Result Date: 05/19/2018 CLINICAL DATA:  Chest tightness and cardiac palpitations EXAM: PORTABLE CHEST 1 VIEW COMPARISON:  Chest radiograph January 21, 2017 and chest CT March 01, 2017 FINDINGS: There is scarring in the right base which is stable. There is diffuse interstitial thickening bilaterally, also stable. There is no airspace consolidation. Heart size and pulmonary vascularity are within normal limits. No adenopathy. Central catheter tip is at the cavoatrial junction. No bone lesions. IMPRESSION: Diffuse interstitial thickening likely reflects a degree of underlying fibrosis and chronic inflammatory type change. There is scarring in the  right base. There is no frank edema or consolidation. Cardiac silhouette is stable. Central catheter tip at cavoatrial junction. No adenopathy evident. Electronically Signed   By: Lowella Grip III M.D.   On: 05/19/2018 09:04    Dialysis Orders:  [place] TTS 3.75h 400/800 EDW 84kg 2K/2.25Ca UF Prof 4 TDC No heparin bolus Hectorol 72mcg IV TIW Parsabiv 2.5mg    Assessment/Plan: 1. AFib w RVR - Cardiology following --on IV diltiazem/amiodarone. INR therapeutic on warfarn.  2. ESRD -  HD TTS. Plan for HD today on schedule if hemodynamics stable.  Use added K bath  3. Hypertension/volume  - BPs soft on IV meds. Does have some volume on exam. UF to EDW as  BP/HR allow 4. Anemia  - Hgb >13. No ESA needs  5. Metabolic bone disease -  Hx calciphylaxis. Outpatient Phos poorly controlled. Follow here. Ca ok. Continue VDRA. No parsabiv in hospital.  6. Nutrition - Renal diet/vitamins 7. Chronic aortic dissection  - per primary/cards   Lynnda Child PA-C [facility] Pager [phone] 05/19/2018, 12:02 PM   Pt seen, examined and agree w A/P as above.  [facility] 05/19/2018, 3:32 PM

## 2018-05-20 ENCOUNTER — Inpatient Hospital Stay (HOSPITAL_COMMUNITY): Payer: Medicare Other

## 2018-05-20 DIAGNOSIS — I959 Hypotension, unspecified: Secondary | ICD-10-CM

## 2018-05-20 DIAGNOSIS — I361 Nonrheumatic tricuspid (valve) insufficiency: Secondary | ICD-10-CM

## 2018-05-20 DIAGNOSIS — Z992 Dependence on renal dialysis: Secondary | ICD-10-CM

## 2018-05-20 LAB — BASIC METABOLIC PANEL
ANION GAP: 17 — AB (ref 5–15)
BUN: 42 mg/dL — ABNORMAL HIGH (ref 6–20)
CO2: 24 mmol/L (ref 22–32)
Calcium: 9.1 mg/dL (ref 8.9–10.3)
Chloride: 99 mmol/L (ref 98–111)
Creatinine, Ser: 12.65 mg/dL — ABNORMAL HIGH (ref 0.61–1.24)
GFR calc Af Amer: 5 mL/min — ABNORMAL LOW (ref >60)
GFR calc non Af Amer: 4 mL/min — ABNORMAL LOW (ref >60)
GLUCOSE: 94 mg/dL (ref 70–99)
Potassium: 4.1 mmol/L (ref 3.5–5.1)
Sodium: 140 mmol/L (ref 135–145)

## 2018-05-20 LAB — LIPID PANEL
Cholesterol: 150 mg/dL (ref 0–200)
HDL: 27 mg/dL — AB (ref >40)
LDL Cholesterol: 93 mg/dL (ref 0–99)
Total CHOL/HDL Ratio: 5.6 RATIO
Triglycerides: 148 mg/dL (ref <150)
VLDL: 30 mg/dL (ref 0–40)

## 2018-05-20 LAB — CBC
HCT: 36.8 % — ABNORMAL LOW (ref 39.0–52.0)
Hemoglobin: 11.7 g/dL — ABNORMAL LOW (ref 13.0–17.0)
MCH: 31.1 pg (ref 26.0–34.0)
MCHC: 31.8 g/dL (ref 30.0–36.0)
MCV: 97.9 fL (ref 80.0–100.0)
Platelets: 133 10*3/uL — ABNORMAL LOW (ref 150–400)
RBC: 3.76 MIL/uL — ABNORMAL LOW (ref 4.22–5.81)
RDW: 14.8 % (ref 11.5–15.5)
WBC: 6.7 10*3/uL (ref 4.0–10.5)
nRBC: 0 % (ref 0.0–0.2)

## 2018-05-20 LAB — ECHOCARDIOGRAM COMPLETE
Height: 74 in
Weight: 3072.33 oz

## 2018-05-20 LAB — PROTIME-INR
INR: 3.03
Prothrombin Time: 30.9 seconds — ABNORMAL HIGH (ref 11.4–15.2)

## 2018-05-20 LAB — HIV ANTIBODY (ROUTINE TESTING W REFLEX): HIV Screen 4th Generation wRfx: NONREACTIVE

## 2018-05-20 LAB — PHOSPHORUS: Phosphorus: 9.2 mg/dL — ABNORMAL HIGH (ref 2.5–4.6)

## 2018-05-20 LAB — TROPONIN I: Troponin I: 0.13 ng/mL (ref <0.03)

## 2018-05-20 LAB — MAGNESIUM: Magnesium: 2.1 mg/dL (ref 1.7–2.4)

## 2018-05-20 MED ORDER — CHLORHEXIDINE GLUCONATE CLOTH 2 % EX PADS
6.0000 | MEDICATED_PAD | Freq: Every day | CUTANEOUS | Status: DC
  Administered 2018-05-21: 6 via TOPICAL

## 2018-05-20 MED ORDER — WARFARIN SODIUM 2.5 MG PO TABS
2.5000 mg | ORAL_TABLET | Freq: Once | ORAL | Status: AC
  Administered 2018-05-20: 2.5 mg via ORAL
  Filled 2018-05-20: qty 1

## 2018-05-20 MED ORDER — AMIODARONE HCL 200 MG PO TABS
400.0000 mg | ORAL_TABLET | Freq: Two times a day (BID) | ORAL | Status: DC
  Administered 2018-05-20 (×2): 400 mg via ORAL
  Filled 2018-05-20 (×3): qty 2

## 2018-05-20 NOTE — Progress Notes (Signed)
Patient ID: Corey [name], male   DOB: 1970-08-23, 48 y.o.   MRN: [id number]      PCP-Cardiologist: Candee Furbish, MD   Subjective:    Refused HD yesterday, will need HD tomorrow am.   He is back in NSR today in 60s on amiodarone gtt.  Denies palpitations overnight.  Feels better.    Objective:   Weight Range: 87.1 kg Body mass index is 24.65 kg/m.   Vital Signs:   Temp:  [97.7 F (36.5 C)-98.3 F (36.8 C)] 98 F (36.7 C) (02/16 0800) Pulse Rate:  [59-140] 63 (02/16 1100) Resp:  [13-22] 18 (02/16 1100) BP: (82-109)/(54-72) 103/60 (02/16 0820) SpO2:  [92 %-100 %] 92 % (02/16 1100) Weight:  [87.1 kg] 87.1 kg (02/15 1828) Last BM Date: 05/20/18  Weight change: Filed Weights   05/19/18 0844 05/19/18 1828  Weight: 85 kg 87.1 kg    Intake/Output:   Intake/Output Summary (Last 24 hours) at 05/20/2018 1112 Last data filed at 05/20/2018 0800 Gross per 24 hour  Intake 955.8 ml  Output 0 ml  Net 955.8 ml      Physical Exam    General:  Well appearing. No resp difficulty HEENT: Normal Neck: Supple. JVP 8 cm. Carotids 2+ bilat; no bruits. No lymphadenopathy or thyromegaly appreciated. Cor: PMI nondisplaced. Regular rate & rhythm. No rubs, gallops or murmurs. Lungs: Clear Abdomen: Soft, nontender, nondistended. No hepatosplenomegaly. No bruits or masses. Good bowel sounds. Extremities: No cyanosis, clubbing, rash, edema Neuro: Alert & orientedx3, cranial nerves grossly intact. moves all 4 extremities w/o difficulty. Affect pleasant   Telemetry   NSR in 60s (personally reviewed)  Labs    CBC Recent Labs    05/19/18 0833 05/19/18 0847 05/20/18 0243  WBC 8.0  --  6.7  HGB 13.4 13.6 11.7*  HCT 42.7 40.0 36.8*  MCV 100.0  --  97.9  PLT 145*  --  782*   Basic Metabolic Panel Recent Labs    05/19/18 0833 05/19/18 0847 05/20/18 0243  NA 141 140 140  K 3.9 3.8 4.1  CL 101  --  99  CO2 22  --  24  GLUCOSE 96  --  94  BUN 35*  --  42*  CREATININE 10.85* 11.50*  12.65*  CALCIUM 9.0  --  9.1  MG 1.9  --  2.1  PHOS  --   --  9.2*   Liver Function Tests No results for input(s): AST, ALT, ALKPHOS, BILITOT, PROT, ALBUMIN in the last 72 hours. No results for input(s): LIPASE, AMYLASE in the last 72 hours. Cardiac Enzymes Recent Labs    05/19/18 1355 05/19/18 2050 05/20/18 0243  TROPONINI 0.06* 0.10* 0.13*    BNP: BNP (last 3 results) No results for input(s): BNP in the last 8760 hours.  ProBNP (last 3 results) No results for input(s): PROBNP in the last 8760 hours.   D-Dimer No results for input(s): DDIMER in the last 72 hours. Hemoglobin A1C No results for input(s): HGBA1C in the last 72 hours. Fasting Lipid Panel Recent Labs    05/20/18 0243  CHOL 150  HDL 27*  LDLCALC 93  TRIG 148  CHOLHDL 5.6   Thyroid Function Tests Recent Labs    05/19/18 0830  TSH 1.088    Other results:   Imaging     No results found.   Medications:     Scheduled Medications: . amiodarone  400 mg Oral BID  . Chlorhexidine Gluconate Cloth  6 each Topical Q0600  .  labetalol  100 mg Oral BID  . multivitamin with minerals  1 tablet Oral Daily  . sevelamer carbonate  2.4 g Oral TID WC  . warfarin  2.5 mg Oral ONCE-1800  . Warfarin - Pharmacist Dosing Inpatient   Does not apply q1800     Infusions: . sodium chloride    . sodium chloride       PRN Medications:  sodium chloride, sodium chloride, acetaminophen, alteplase, lidocaine (PF), lidocaine-prilocaine, ondansetron (ZOFRAN) IV, pentafluoroprop-tetrafluoroeth    Assessment/Plan   1. Atrial fibrillation: History of PAF on warfarin, now with more frequent symptomatic tachypalpitations at home, to ER with afib/RVR. In and out of atrial fibrillation with RVR in the ER. He is very symptomatic with episodes.  I do not think that he will stay in normal rhythm without an anti-arrhythmic medication.  Given ESRD, our options are limited. He is now on amiodarone gtt and staying in NSR.    - Stop amiodarone gtt, transition to amiodarone 400 mg bid.  - Continue warfarin, INR therapeutic.  - Echo today.  2. Elevated troponin: Suspect demand ischemia in setting of afib/RVR and ESRD. No chest pain.  3. ESRD: Refused HD yesterday.  Will need HD tomorrow am.  4. Type B dissection: Stable.  5. Disposition: Would watch in hospital on po amiodarone today, suspect he can go home after HD tomorrow morning.    Length of Stay: 1  Loralie Champagne, MD  05/20/2018, 11:12 AM  Advanced Heart Failure Team Pager [phone] (M-F; [place])  Please contact [facility] for night-coverage after hours (4p -7a ) and weekends on [email or web address]

## 2018-05-20 NOTE — Progress Notes (Signed)
[place] TEAM 1 - Stepdown/ICU TEAM  Corey [name]  MVH:[id number] DOB: [birth year] DOA: 05/19/2018 PCP: Clent Demark, PA-C    Brief Narrative:  48 y.o. male with a hx of blindness, paroxysmal Afib,ESRD on HD TTS, HA, HTN, paralysis of lower extremities, calciphylaxis of LUE, Stargardt disease, acute (2019) on chronic (repaired in 2004,[place]) typeB thoracoabdominal aortic dissection who presented w/ palpitations that woke him up from sleep at 5 AM.  In the ED he was found to be in A fib with heart rate 130-140. A Cardizem drip was not successful in controlling his rate, and lead to hypotension. IV amiodarone was initiated. He reports frequent bouts of RVR which he is usually able to control w/ extra doses of labetalol.   Significant Events: 2/15 admit   Subjective: All active issues are being managed by Cardiology and Nephrology at present. I have reviewed his chart at length. Pt will remain on TRH service, but will not be charged by [facility] for a visit today.   Assessment & Plan:  Paroxysmal atrial fibrillation with acute RVR Cardiology following -currently requiring amiodarone drip -continue Coumadin per pharmacy  ESRD -Tuesday/Thursday/Saturday HD Care per nephrology  Type B aortic dissection Extensive surgical repair 2004 in [place] -complicated by prolonged hospitalization and bilateral lower extremity hemiparalysis  Peripheral neuropathy  Calciphylaxis  DVT prophylaxis: Warfarin Code Status: FULL CODE Family Communication:  Disposition Plan: SDU  Consultants:  Cardiology Nephrology  Antimicrobials:  None  Objective: Blood pressure 103/60, pulse 68, temperature 98 F (36.7 C), temperature source Oral, resp. rate (!) 22, height 6\' 2"  (1.88 m), weight 87.1 kg, SpO2 96 %.  Intake/Output Summary (Last 24 hours) at 05/20/2018 1051 Last data filed at 05/20/2018 0800 Gross per 24 hour  Intake 715.8 ml  Output 0 ml  Net 715.8 ml   Filed Weights   05/19/18 0844  05/19/18 1828  Weight: 85 kg 87.1 kg    Examination: No exam today - Cardiology and Nephrology addressing all acute issues   CBC: Recent Labs  Lab 05/19/18 0833 05/19/18 0847 05/20/18 0243  WBC 8.0  --  6.7  HGB 13.4 13.6 11.7*  HCT 42.7 40.0 36.8*  MCV 100.0  --  97.9  PLT 145*  --  841*   Basic Metabolic Panel: Recent Labs  Lab 05/19/18 0833 05/19/18 0847 05/20/18 0243  NA 141 140 140  K 3.9 3.8 4.1  CL 101  --  99  CO2 22  --  24  GLUCOSE 96  --  94  BUN 35*  --  42*  CREATININE 10.85* 11.50* 12.65*  CALCIUM 9.0  --  9.1  MG 1.9  --  2.1  PHOS  --   --  9.2*   GFR: Estimated Creatinine Clearance: 8.4 mL/min (A) (by C-G formula based on SCr of 12.65 mg/dL (H)).  Liver Function Tests: No results for input(s): AST, ALT, ALKPHOS, BILITOT, PROT, ALBUMIN in the last 168 hours. No results for input(s): LIPASE, AMYLASE in the last 168 hours. No results for input(s): AMMONIA in the last 168 hours.  Coagulation Profile: Recent Labs  Lab 05/19/18 0833 05/20/18 0243  INR 2.69 3.03    Cardiac Enzymes: Recent Labs  Lab 05/19/18 1355 05/19/18 2050 05/20/18 0243  TROPONINI 0.06* 0.10* 0.13*    HbA1C: No results found for: HGBA1C  CBG: Recent Labs  Lab 05/19/18 0905  GLUCAP 92    Recent Results (from the past 240 hour(s))  MRSA PCR Screening  Status: None   Collection Time: 05/19/18  4:03 PM  Result Value Ref Range Status   MRSA by PCR NEGATIVE NEGATIVE Final    Comment:        The GeneXpert MRSA Assay (FDA approved for NASAL specimens only), is one component of a comprehensive MRSA colonization surveillance program. It is not intended to diagnose MRSA infection nor to guide or monitor treatment for MRSA infections. Performed at [facility], [place] [address] [place], [place] [address]      Scheduled Meds: . Chlorhexidine Gluconate Cloth  6 each Topical Q0600  . Chlorhexidine Gluconate Cloth  6 each Topical Q0600  . labetalol  100  mg Oral BID  . multivitamin with minerals  1 tablet Oral Daily  . sevelamer carbonate  2.4 g Oral TID WC  . warfarin  2.5 mg Oral ONCE-1800  . Warfarin - Pharmacist Dosing Inpatient   Does not apply q1800   Continuous Infusions: . sodium chloride    . sodium chloride    . amiodarone 30 mg/hr (05/20/18 0800)     LOS: 1 day   Cherene Altes, MD [facility] Office  [phone] Pager - Text Page per Amion  If 7PM-7AM, please contact night-coverage per Amion 05/20/2018, 10:51 AM

## 2018-05-20 NOTE — Progress Notes (Signed)
[place] for warfarin Indication: atrial fibrillation   Patient Measurements: Height: 6\' 2"  (188 cm) Weight: 192 lb 0.3 oz (87.1 kg) IBW/kg (Calculated) : 82.2   Vital Signs: Temp: 98 F (36.7 C) (02/16 0800) Temp Source: Oral (02/16 0800) BP: 103/60 (02/16 0820) Pulse Rate: 68 (02/16 0820)  Labs: Recent Labs    05/19/18 0833 05/19/18 0847 05/19/18 1355 05/19/18 2050 05/20/18 0243  HGB 13.4 13.6  --   --  11.7*  HCT 42.7 40.0  --   --  36.8*  PLT 145*  --   --   --  133*  LABPROT 28.2*  --   --   --  30.9*  INR 2.69  --   --   --  3.03  CREATININE 10.85* 11.50*  --   --  12.65*  TROPONINI  --   --  0.06* 0.10* 0.13*     Medical History: Past Medical History:  Diagnosis Date  . Arthritis    hands and shoulders  . Blindness and low vision    "Stargardt disease"  . Dissection of aorta ([place]) 2004   a. s/p extensive repeair in 0388 in [place] complicated by ESRD, lower extremity paralysis, coma, and extended hospitalization of 2 years  . ESRD (end stage renal disease) ([place])    a. TTS  . Headache   . History of cardioversion 2014  . Hypertension   . Neuropathy   . Non-healing non-surgical wound 03/2016  . PAF (paroxysmal atrial fibrillation) ([place])    a. s/p DCCV in 2014; b. on Coumadin; c. CHADS2VASc => 2 (HTN, vascular disease)  . Paralysis ([place])    due to dissection of aorta in 2004, lower extremities  . Pneumonia     Assessment: AFib on warfarin prior to admission, admitted with palpitations. Warfarin dose PTA: 5 mg Mon/Fri, 7.5 mg on all other days  INR is up to 3.0 this morning, hgb down slightly to 11.7, no bleeding issues noted.   Refused HD treatment last night.   Goal of Therapy:  INR 2-3 Monitor platelets by anticoagulation protocol: Yes    Plan:  -warfarin 2.5 mg po x1 -daily INR  Erin Hearing PharmD., BCPS Clinical Pharmacist 05/20/2018 10:04 AM

## 2018-05-20 NOTE — Progress Notes (Signed)
*  PRELIMINARY RESULTS* Echocardiogram 2D Echocardiogram has been performed.  Corey [name] 05/20/2018, 11:04 AM

## 2018-05-20 NOTE — Progress Notes (Signed)
[facility] Progress Note  Subjective: doing well, no c/o  Vitals:   05/20/18 0434 05/20/18 0700 05/20/18 0800 05/20/18 0820  BP: (!) 101/58  (!) 95/54 103/60  Pulse: 68 61 62 68  Resp: 13 17 18  (!) 22  Temp: 98.3 F (36.8 C)  98 F (36.7 C)   TempSrc: Oral  Oral   SpO2: 95% 97% 100% 96%  Weight:      Height:        Inpatient medications: . Chlorhexidine Gluconate Cloth  6 each Topical Q0600  . labetalol  100 mg Oral BID  . multivitamin with minerals  1 tablet Oral Daily  . sevelamer carbonate  2.4 g Oral TID WC  . warfarin  2.5 mg Oral ONCE-1800  . Warfarin - Pharmacist Dosing Inpatient   Does not apply q1800   . sodium chloride    . sodium chloride    . amiodarone 30 mg/hr (05/20/18 0800)   sodium chloride, sodium chloride, acetaminophen, alteplase, lidocaine (PF), lidocaine-prilocaine, ondansetron (ZOFRAN) IV, pentafluoroprop-tetrafluoroeth  Iron/TIBC/Ferritin/ %Sat No results found for: IRON, TIBC, FERRITIN, IRONPCTSAT  Exam: General: Ill appearing male WNWD NAD  Head: NCAT sclera not icteric MMM Neck: Supple Lungs: CTA bilat Heart: Irregular rhythm Abdomen: soft NT + BS Lower extremities:  Trace LE edema; vascular skin changes  Neuro: A & O  X 3. Moves all extremities spontaneously. Dialysis Access: R IJ [facility]     Home meds:  - labetalol 100 bid  - sevelamer 1.6 tid ac/ cinacalcet 60 bid  - warfarin as directed  [place] TTS   3h 48min  400/800  84kg 2K/2.25Ca bath  P4  Heparin none  TDC Hectorol 78mcg IV TIW Parsabiv 2.5mg    Assessment/ Plan: 1. AFib w RVR - Cardiology following --on IV amiodarone, coumadin 2. ESRD -  HD TTS. Declined HD yest. Plan short HD tomorrow then get back on sched Tuesday if still here.  3. Hypertension/volume  - BPs soft on IV meds. Up 2-3 kg, min edema.  4. Anemia  - Hgb >13. No ESA needs  5. Metabolic bone disease -  Hx calciphylaxis. Outpatient Phos poorly controlled. Follow here. Ca ok. Continue VDRA. No  parsabiv in hospital.  6. Nutrition - Renal diet/vitamins 7. Chronic aortic dissection    [facility] 05/20/2018, 10:43 AM  Recent Labs  Lab 05/19/18 0833 05/19/18 0847 05/20/18 0243  NA 141 140 140  K 3.9 3.8 4.1  CL 101  --  99  CO2 22  --  24  GLUCOSE 96  --  94  BUN 35*  --  42*  CREATININE 10.85* 11.50* 12.65*  CALCIUM 9.0  --  9.1  PHOS  --   --  9.2*  INR 2.69  --  3.03   No results for input(s): AST, ALT, ALKPHOS, BILITOT, PROT in the last 168 hours. Recent Labs  Lab 05/19/18 0833 05/19/18 0847 05/20/18 0243  WBC 8.0  --  6.7  HGB 13.4 13.6 11.7*  HCT 42.7 40.0 36.8*  MCV 100.0  --  97.9  PLT 145*  --  133*

## 2018-05-21 ENCOUNTER — Encounter (INDEPENDENT_AMBULATORY_CARE_PROVIDER_SITE_OTHER): Payer: Medicare Other | Admitting: Ophthalmology

## 2018-05-21 LAB — COMPREHENSIVE METABOLIC PANEL
ALT: 7 U/L (ref 0–44)
AST: 8 U/L — ABNORMAL LOW (ref 15–41)
Albumin: 3 g/dL — ABNORMAL LOW (ref 3.5–5.0)
Alkaline Phosphatase: 42 U/L (ref 38–126)
Anion gap: 17 — ABNORMAL HIGH (ref 5–15)
BUN: 50 mg/dL — ABNORMAL HIGH (ref 6–20)
CO2: 24 mmol/L (ref 22–32)
Calcium: 8.7 mg/dL — ABNORMAL LOW (ref 8.9–10.3)
Chloride: 97 mmol/L — ABNORMAL LOW (ref 98–111)
Creatinine, Ser: 14.59 mg/dL — ABNORMAL HIGH (ref 0.61–1.24)
GFR calc non Af Amer: 3 mL/min — ABNORMAL LOW (ref >60)
GFR, EST AFRICAN AMERICAN: 4 mL/min — AB (ref >60)
Glucose, Bld: 90 mg/dL (ref 70–99)
Potassium: 4.2 mmol/L (ref 3.5–5.1)
Sodium: 138 mmol/L (ref 135–145)
Total Bilirubin: 1 mg/dL (ref 0.3–1.2)
Total Protein: 5.9 g/dL — ABNORMAL LOW (ref 6.5–8.1)

## 2018-05-21 LAB — MAGNESIUM: Magnesium: 2.2 mg/dL (ref 1.7–2.4)

## 2018-05-21 LAB — PROTIME-INR
INR: 3.15
PROTHROMBIN TIME: 31.9 s — AB (ref 11.4–15.2)

## 2018-05-21 MED ORDER — AMIODARONE HCL 200 MG PO TABS
400.0000 mg | ORAL_TABLET | Freq: Every day | ORAL | Status: DC
  Administered 2018-05-21: 400 mg via ORAL

## 2018-05-21 MED ORDER — METOPROLOL SUCCINATE ER 25 MG PO TB24: 25.0000 mg | ORAL_TABLET | Freq: Two times a day (BID) | ORAL | 0 refills | Status: DC

## 2018-05-21 MED ORDER — AMIODARONE HCL 200 MG PO TABS: 400.0000 mg | ORAL_TABLET | Freq: Every day | ORAL | Status: DC

## 2018-05-21 MED ORDER — AMIODARONE HCL 400 MG PO TABS: ORAL_TABLET | ORAL | 0 refills | Status: DC

## 2018-05-21 MED ORDER — WARFARIN SODIUM 2.5 MG PO TABS
2.5000 mg | ORAL_TABLET | Freq: Once | ORAL | Status: AC
  Administered 2018-05-21: 2.5 mg via ORAL
  Filled 2018-05-21: qty 1

## 2018-05-21 MED ORDER — METOPROLOL SUCCINATE ER 25 MG PO TB24
25.0000 mg | ORAL_TABLET | Freq: Two times a day (BID) | ORAL | Status: DC
  Administered 2018-05-21: 25 mg via ORAL
  Filled 2018-05-21: qty 1

## 2018-05-21 MED ORDER — ANTICOAGULANT SODIUM CITRATE 4% (200MG/5ML) IV SOLN
5.0000 mL | Status: DC | PRN
  Filled 2018-05-21: qty 5

## 2018-05-21 NOTE — Progress Notes (Signed)
[facility] Progress Note  Subjective: doing well, no c/o, on HD  Vitals:   05/21/18 0900 05/21/18 0930 05/21/18 1000 05/21/18 1023  BP: 115/66 (!) 93/55 (!) 84/49 (!) 104/58  Pulse: 77 74 66 67  Resp:    18  Temp:    97.8 F (36.6 C)  TempSrc:    Oral  SpO2:    95%  Weight:    84.1 kg  Height:        Inpatient medications: . amiodarone  400 mg Oral BID  . Chlorhexidine Gluconate Cloth  6 each Topical Q0600  . labetalol  100 mg Oral BID  . multivitamin with minerals  1 tablet Oral Daily  . sevelamer carbonate  2.4 g Oral TID WC  . warfarin  2.5 mg Oral ONCE-1800  . Warfarin - Pharmacist Dosing Inpatient   Does not apply q1800   . anticoagulant sodium citrate     acetaminophen, anticoagulant sodium citrate, ondansetron (ZOFRAN) IV  Iron/TIBC/Ferritin/ %Sat No results found for: IRON, TIBC, FERRITIN, IRONPCTSAT  Exam: General: Ill appearing male WNWD NAD  Head: NCAT sclera not icteric MMM Neck: Supple Lungs: CTA bilat Heart: Irregular rhythm Abdomen: soft NT + BS Lower extremities:  Trace LE edema; vascular skin changes  Neuro: A & O  X 3. Moves all extremities spontaneously. Dialysis Access: R [facility]     Home meds:  - labetalol 100 bid  - sevelamer 1.6 tid ac/ cinacalcet 60 bid  - warfarin as directed  [place] TTS   3h 7min  400/800  84kg 2K/2.25Ca bath  P4  Heparin none  TDC Hectorol 38mcg IV TIW Parsabiv 2.5mg    Assessment/ Plan: 1. AFib w RVR - now on po amiodarone, coumadin, per cards can be dc'd today after HD.  2. ESRD -  HD TTS. Declined HD yest, on short HD now plan UF to dry wt 84kg , then will get OP HD tomorrow.  3. Hypertension/volume  - BPs soft on IV meds. Up 2-3 kg, min edema.  4. Anemia  - Hgb >13. No ESA needs  5. Metabolic bone disease -  Hx calciphylaxis. Outpatient Phos poorly controlled. Follow here. Ca ok. Continue VDRA. No parsabiv in hospital.  6. Nutrition - Renal diet/vitamins 7. Chronic aortic dissection    [facility] 05/21/2018, 12:31 PM  Recent Labs  Lab 05/20/18 0243 05/21/18 0235  NA 140 138  K 4.1 4.2  CL 99 97*  CO2 24 24  GLUCOSE 94 90  BUN 42* 50*  CREATININE 12.65* 14.59*  CALCIUM 9.1 8.7*  PHOS 9.2*  --   ALBUMIN  --  3.0*  INR 3.03 3.15   Recent Labs  Lab 05/21/18 0235  AST 8*  ALT 7  ALKPHOS 42  BILITOT 1.0  PROT 5.9*   Recent Labs  Lab 05/19/18 0833 05/19/18 0847 05/20/18 0243  WBC 8.0  --  6.7  HGB 13.4 13.6 11.7*  HCT 42.7 40.0 36.8*  MCV 100.0  --  97.9  PLT 145*  --  133*

## 2018-05-21 NOTE — Discharge Instructions (Signed)
Atrial Fibrillation Atrial fibrillation is a type of irregular or rapid heartbeat (arrhythmia). In atrial fibrillation, the top part of the heart (atria) quivers in a chaotic pattern. This makes the heart unable to pump blood normally. Having atrial fibrillation can increase your risk for other health problems, such as:  Blood can pool in the atria and form clots. If a clot travels to the brain, it can cause a stroke.  The heart muscle may weaken from the irregular blood flow. This can cause heart failure. Atrial fibrillation may start suddenly and stop on its own, or it may become a long-lasting problem. What are the causes? This condition is caused by some heart-related conditions or procedures, including:  High blood pressure. This is the most common cause.  Heart failure.  Heart valve conditions.  Inflammation of the sac that surrounds the heart (pericarditis).  Heart surgery.  Coronary artery disease.  Certain heart rhythm disorders, such as Wolf-Parkinson-White syndrome. Other causes include:  Pneumonia.  Obstructive sleep apnea.  Lung cancer.  Thyroid problems, especially if the thyroid is overactive (hyperthyroidism).  Excessive alcohol or drug use. Sometimes, the cause of this condition is not known. What increases the risk? This condition is more likely to develop in:  Older people.  People who smoke.  People who have diabetes mellitus.  People who are overweight (obese).  Athletes who exercise vigorously.  People who have a family history. What are the signs or symptoms? Symptoms of this condition include:  A feeling that your heart is beating rapidly or irregularly.  A feeling of discomfort or pain in your chest.  Shortness of breath.  Sudden light-headedness or weakness.  Getting tired easily during exercise. In some cases, there are no symptoms. How is this diagnosed? Your health care provider may be able to detect atrial fibrillation when  taking your pulse. If detected, this condition may be diagnosed with:  Electrocardiogram (ECG).  Ambulatory cardiac monitor. This device records your heartbeats for 24 hours or more.  Transthoracic echocardiogram (TTE) to evaluate how blood flows through your heart.  Transesophageal echocardiogram (TEE) to view more detailed images of your heart.  A stress test.  Imaging tests, such as a CT scan or chest X-ray.  Blood tests. How is this treated? This condition may be treated with:  Medicines to slow down the heart rate or bring the heart's rhythm back to normal.  Medicines to prevent blood clots from forming.  Electrical cardioversion. This delivers a low-energy shock to the heart to reset its rhythm.  Ablation. This procedure destroys the part of the heart tissue that sends abnormal signals.  Left atrial appendage occlusion/excision. This seals off a common place in the atria where blood clots can form (left atrial appendage). The goal of treatment is to prevent blood clots from forming and to keep your heart beating at a normal rate and rhythm. Treatment depends on underlying medical conditions and how you feel when you are experiencing fibrillation. Follow these instructions at home: Medicines  Take over-the counter and prescription medicines only as told by your health care provider.  If your health care provider prescribed a blood-thinning medicine (anticoagulant), take it exactly as told. Taking too much blood-thinning medicine can cause bleeding. Taking too little can enable a blood clot to form and travel to the brain, causing a stroke. Lifestyle      Do not use any products that contain nicotine or tobacco, such as cigarettes and e-cigarettes. If you need help quitting, ask your health   care provider.  Do not drink beverages that contain caffeine, such as coffee, soda, and tea.  Follow diet instructions as told by your health care provider.  Exercise regularly as  told by your health care provider.  Do not drink alcohol. General instructions  If you have obstructive sleep apnea, manage your condition as told by your health care provider.  Maintain a healthy weight. Do not use diet pills unless your health care provider approves. Diet pills may make heart problems worse.  Keep all follow-up visits as told by your health care provider. This is important. Contact a health care provider if you:  Notice a change in the rate, rhythm, or strength of your heartbeat.  Are taking an anticoagulant and you notice increased bruising.  Tire more easily when you exercise or exert yourself.  Have a sudden change in weight. Get help right away if you have:   Chest pain, abdominal pain, sweating, or weakness.  Difficulty breathing.  Blood in your vomit, stool (feces), or urine.  Any symptoms of a stroke. "BE FAST" is an easy way to remember the main warning signs of a stroke: ? B - Balance. Signs are dizziness, sudden trouble walking, or loss of balance. ? E - Eyes. Signs are trouble seeing or a sudden change in vision. ? F - Face. Signs are sudden weakness or numbness of the face, or the face or eyelid drooping on one side. ? A - Arms. Signs are weakness or numbness in an arm. This happens suddenly and usually on one side of the body. ? S - Speech. Signs are sudden trouble speaking, slurred speech, or trouble understanding what people say. ? T - Time. Time to call emergency services. Write down what time symptoms started.  Other signs of a stroke, such as: ? A sudden, severe headache with no known cause. ? Nausea or vomiting. ? Seizure. These symptoms may represent a serious problem that is an emergency. Do not wait to see if the symptoms will go away. Get medical help right away. Call your local emergency services (911 in the [place] Do not drive yourself to the hospital. Summary  Atrial fibrillation is a type of irregular or rapid heartbeat  (arrhythmia).  Symptoms include a feeling that your heart is beating fast or irregularly. In some cases, you may not have symptoms.  The condition is treated with medicines to slow down the heart rate or bring the heart's rhythm back to normal. You may also need blood-thinning medicines to prevent blood clots.  Get help right away if you have symptoms or signs of a stroke. This information is not intended to replace advice given to you by your health care provider. Make sure you discuss any questions you have with your health care provider. Document Released: 03/21/2005 Document Revised: 05/12/2017 Document Reviewed: 05/12/2017 Elsevier Interactive Patient Education  2019 [employer]  

## 2018-05-21 NOTE — Progress Notes (Addendum)
Patient ID: Corey [name], male   DOB: 1970/06/22, 48 y.o.   MRN: [id number]      PCP-Cardiologist: Candee Furbish, MD   Subjective:    Had HD today.  No CP or SOB, feels the PVCs    Objective:   Weight Range: 84.1 kg Body mass index is 23.8 kg/m.   Vital Signs:   Temp:  [97.8 F (36.6 C)-98.3 F (36.8 C)] 97.8 F (36.6 C) (02/17 1023) Pulse Rate:  [52-80] 67 (02/17 1023) Resp:  [15-20] 18 (02/17 1023) BP: (84-143)/(49-85) 104/58 (02/17 1023) SpO2:  [83 %-95 %] 95 % (02/17 1023) Weight:  [84.1 kg-86.5 kg] 84.1 kg (02/17 1023) Last BM Date: 05/20/18  Weight change: Filed Weights   05/19/18 1828 05/21/18 0718 05/21/18 1023  Weight: 87.1 kg 86.5 kg 84.1 kg    Intake/Output:   Intake/Output Summary (Last 24 hours) at 05/21/2018 1319 Last data filed at 05/21/2018 1023 Gross per 24 hour  Intake 59.57 ml  Output 2097 ml  Net -2037.43 ml      Physical Exam    General: Well developed, well nourished, male in no acute distress Head: Eyes PERRLA, No xanthomas.   Normocephalic and atraumatic Lungs: Clear bilaterally to auscultation. Heart: HRRR S1 S2, without MRG.  Pulses are 2+ & equal. No JVD. Abdomen: Bowel sounds are present, abdomen soft and non-tender without masses or  hernias noted. Msk: Normal strength and tone for age. Extremities: No clubbing, cyanosis or edema.    Skin:  No rashes or lesions noted. Neuro: Alert and oriented X 3. Psych:  Good affect, responds appropriately  Telemetry   SR, occ PVCs  Labs    CBC Recent Labs    05/19/18 0833 05/19/18 0847 05/20/18 0243  WBC 8.0  --  6.7  HGB 13.4 13.6 11.7*  HCT 42.7 40.0 36.8*  MCV 100.0  --  97.9  PLT 145*  --  353*   Basic Metabolic Panel Recent Labs    05/20/18 0243 05/21/18 0235  NA 140 138  K 4.1 4.2  CL 99 97*  CO2 24 24  GLUCOSE 94 90  BUN 42* 50*  CREATININE 12.65* 14.59*  CALCIUM 9.1 8.7*  MG 2.1 2.2  PHOS 9.2*  --    Liver Function Tests Recent Labs    05/21/18 0235    AST 8*  ALT 7  ALKPHOS 42  BILITOT 1.0  PROT 5.9*  ALBUMIN 3.0*    Cardiac Enzymes Recent Labs    05/19/18 1355 05/19/18 2050 05/20/18 0243  TROPONINI 0.06* 0.10* 0.13*   Fasting Lipid Panel Recent Labs    05/20/18 0243  CHOL 150  HDL 27*  LDLCALC 93  TRIG 148  CHOLHDL 5.6   Thyroid Function Tests Recent Labs    05/19/18 0830  TSH 1.088    Other results: ECHO: 05/20/2018  1. The left ventricle has normal systolic function with an ejection fraction of 60-65%. The cavity size was normal. There is mildly increased left ventricular wall thickness. Left ventricular diastology could not be evaluated due to nondiagnostic  images. No evidence of left ventricular regional wall motion abnormalities.  2. The right ventricle has normal systolic function. The cavity was normal. There is no increase in right ventricular wall thickness. Right ventricular systolic pressure is moderately elevated with an estimated pressure of 42.8 mmHg.  3. The pericardial effusion is posterior to the left ventricle.  4. Trivial pericardial effusion.  5. The mitral valve is normal in structure.  6. The  tricuspid valve is normal in structure.  7. The aortic valve is tricuspid Mild sclerosis of the aortic valve.  8. The pulmonic valve was normal in structure.  9. The inferior vena cava was dilated in size with >50% respiratory variability. 10. No evidence of left ventricular regional wall motion abnormalities. 11. Right atrial pressure is estimated at 8 mmHg.  FINDINGS  Left Ventricle: The left ventricle has normal systolic function, with an ejection fraction of 60-65%. The cavity size was normal. There is mildly increased left ventricular wall thickness. Left ventricular diastology could not be evaluated due to  nondiagnostic images. No evidence of left ventricular regional wall motion abnormalities.. Right Ventricle: The right ventricle has normal systolic function. The cavity was normal. There is  no increase in right ventricular wall thickness. Right ventricular systolic pressure is moderately elevated with an estimated pressure of 42.8 mmHg. Left Atrium: left atrial size was normal in size Right Atrium: right atrial size was normal in size Right atrial pressure is estimated at 8 mmHg. Interatrial Septum: No atrial level shunt detected by color flow Doppler. Pericardium: Trivial pericardial effusion is present. The pericardial effusion is posterior to the left ventricle. Mitral Valve: The mitral valve is normal in structure. Mitral valve regurgitation is trivial by color flow Doppler. Tricuspid Valve: The tricuspid valve is normal in structure. Tricuspid valve regurgitation is mild by color flow Doppler. Aortic Valve: The aortic valve is tricuspid Mild sclerosis of the aortic valve Aortic valve regurgitation was not visualized by color flow Doppler. Pulmonic Valve: The pulmonic valve was normal in structure. Pulmonic valve regurgitation is not visualized by color flow Doppler. Venous: The inferior vena cava is dilated in size with greater than 50% respiratory variability.    Imaging    No results found.   Medications:     Scheduled Medications: . amiodarone  400 mg Oral BID  . Chlorhexidine Gluconate Cloth  6 each Topical Q0600  . labetalol  100 mg Oral BID  . multivitamin with minerals  1 tablet Oral Daily  . sevelamer carbonate  2.4 g Oral TID WC  . warfarin  2.5 mg Oral ONCE-1800  . Warfarin - Pharmacist Dosing Inpatient   Does not apply q1800    Infusions: . anticoagulant sodium citrate      PRN Medications: acetaminophen, anticoagulant sodium citrate, ondansetron (ZOFRAN) IV    Assessment/Plan   1. Atrial fibrillation:  - hx PAF on warfarin, palps more frequent at home - He was in/out of A. fib in the ER - Amiodarone has helped keep him in sinus rhythm - Continue amiodarone 400 mg twice daily x7 days, then 400 mg daily until seen in the office -Both atria  are normal in size so amiodarone may keep him in sinus  2. Elevated troponin:  -Mild elevation in troponin in the setting of atrial fib with RVR and ESRD on HD is most likely demand ischemia - No chest pain, no further ischemic evaluation planned   3. ESRD:  -Per Nephrology, getting dialysis today  4. Type B dissection: Follow  5. Disposition:  -MD to advise if he can go home after dialysis - has f/u appt arranged     Length of Stay: 2  Rosaria Ferries, PA-C  05/21/2018, 1:19 PM   Patient seen and examined   Agree with findings as noted above by R Barrett Above   Pt remains in SR  On exam, he has had dialysis Neck:  JVP is normal Lungs are CTA Card: RRR  S1, S2  No murmur Ext No edema    Agree with plan for amiodarone 400 bid for 7 day then decrease to 400 daily. Continue coumadin  Will be followed as outpt OK to d/c home today  Dorris Carnes

## 2018-05-21 NOTE — Progress Notes (Signed)
[facility] for warfarin Indication: atrial fibrillation   Patient Measurements: Height: 6\' 2"  (188 cm) Weight: 185 lb 6.5 oz (84.1 kg) IBW/kg (Calculated) : 82.2   Vital Signs: Temp: 97.8 F (36.6 C) (02/17 1023) Temp Source: Oral (02/17 1023) BP: 104/58 (02/17 1023) Pulse Rate: 67 (02/17 1023)  Labs: Recent Labs    05/19/18 0833 05/19/18 0847 05/19/18 1355 05/19/18 2050 05/20/18 0243 05/21/18 0235  HGB 13.4 13.6  --   --  11.7*  --   HCT 42.7 40.0  --   --  36.8*  --   PLT 145*  --   --   --  133*  --   LABPROT 28.2*  --   --   --  30.9* 31.9*  INR 2.69  --   --   --  3.03 3.15  CREATININE 10.85* 11.50*  --   --  12.65* 14.59*  TROPONINI  --   --  0.06* 0.10* 0.13*  --      Medical History: Past Medical History:  Diagnosis Date  . Arthritis    hands and shoulders  . Blindness and low vision    "Stargardt disease"  . Dissection of aorta ([place]) 2004   a. s/p extensive repeair in 9163 in [place] complicated by ESRD, lower extremity paralysis, coma, and extended hospitalization of 2 years  . ESRD (end stage renal disease) ([place])    a. TTS  . Headache   . History of cardioversion 2014  . Hypertension   . Neuropathy   . Non-healing non-surgical wound 03/2016  . PAF (paroxysmal atrial fibrillation) ([place])    a. s/p DCCV in 2014; b. on Coumadin; c. CHADS2VASc => 2 (HTN, vascular disease)  . Paralysis ([place])    due to dissection of aorta in 2004, lower extremities  . Pneumonia     Assessment: AFib on warfarin prior to admission, admitted with palpitations. Warfarin dose PTA: 5 mg Mon/Fri, 7.5 mg on all other days  Pt s/p HD today. INR slightly above goal at 3.15.  Goal of Therapy:  INR 2-3 Monitor platelets by anticoagulation protocol: Yes    Plan:  -warfarin 2.5 mg po x1 -daily INR  Arrie Senate, PharmD, BCPS Clinical Pharmacist [phone] Please check AMION for all [facility] numbers 05/21/2018

## 2018-05-21 NOTE — Discharge Summary (Signed)
DISCHARGE SUMMARY  Corey [name]  MR#: [id number]  DOB:[birth year]  Date of Admission: 05/19/2018 Date of Discharge: 05/21/2018  Attending Physician:Melitta Tigue Hennie Duos, MD  Patient's [name], Corey Albee, PA-C  Consults: Cardiology Nephrology   Disposition: D/C home   Follow-up Appts: Follow-up Information    Corey Serge, NP Follow up on 06/04/2018.   Specialties:  Cardiology, Radiology Why:  Please arrive at 10:15 am for a 10:30 am appt.  Contact information: [address] [place] [address] [phone]        Corey Demark, PA-C Follow up in 1 week(s).   Specialty:  Physician Assistant Contact information: [place] [phone]        Corey Pain, MD .   Specialty:  Cardiology Contact information: [phone] [place] [address] [phone]           Tests Needing Follow-up: - assess HR and rhythm - assess tolerance of amiodarone  - follow INR / warfarin dosing   Discharge Diagnoses: Paroxysmal atrial fibrillation with acute RVR ESRD -Tuesday/Thursday/Saturday HD Type B aortic dissection Peripheral neuropathy Calciphylaxis  Initial presentation: 48 y.o.malewith a hx of blindness, paroxysmal Afib,ESRDon HD TTS, HA, HTN, paralysis of lower extremities, calciphylaxis of LUE, Stargardt disease, acute(2019)on chronic (repaired in 2004,[place])typeB thoracoabdominal aortic dissection who presented w/ palpitationsthat woke him up from sleep at 5 AM.  In the ED he was found to be in A fib with heart rate 130-140. A Cardizem drip was not successful in controlling his rate, and lead to hypotension. IV amiodarone was initiated. He reported frequent bouts of RVR which he is usually able to control w/ extra doses of labetalol.   Hospital Course:  Paroxysmal atrial fibrillation with acute RVR Cardiology followed and cared for this issue - continue Coumadin w/ INR therapeutic  during this admit - Cards felt he would need ongoing anti-arrythmia tx and therefore amio was transitioned from an IV gtt to an oral load   ESRD -Tuesday/Thursday/Saturday HD Care per Nephrology - refused HD on 2/15 in hospital - underwent short HD in hospital on 2/17, w/ plan to resume usual schedule as outpt 2/18  Type B aortic dissection Extensive surgical repair 2004 in [place] -complicated by prolonged hospitalization and bilateral lower extremity hemiparalysis  Peripheral neuropathy Continued usual outpt tx  Calciphylaxis Care as per Nephrology   Allergies as of 05/21/2018      Reactions   Ciprofloxacin Other (See Comments)   Aortic dissection   Heparin Other (See Comments)   UNSPECIFIED REACTION :  On Coumadin since 2004 HIT panel negative 01/19/17      Medication List    STOP taking these medications   cinacalcet 30 MG tablet Commonly known as:  SENSIPAR   labetalol 100 MG tablet Commonly known as:  NORMODYNE     TAKE these medications   amiodarone 400 MG tablet Commonly known as:  PACERONE TAKE 1 TABLET 2X A DAY FOR 7 DAYS, THEN CHANGE TO ONE TABLET A DAY FROM THAT POINT FORWARD   metoprolol succinate 25 MG 24 hr tablet Commonly known as:  TOPROL-XL Take 1 tablet (25 mg total) by mouth 2 (two) times daily with a meal.   multivitamin with minerals tablet Take 1 tablet by mouth daily.   sevelamer carbonate 0.8 g Pack packet Commonly known as:  RENVELA Take 1.6 g by mouth 3 (three) times daily with meals.   warfarin 5 MG tablet Commonly known as:  COUMADIN Take as directed. If you are unsure how to take this medication, talk to your nurse or doctor. Original instructions:  Take as directed by coumadin clinic What changed:    how much to take  how to take this  when to take this  additional instructions       Day of Discharge BP (!) 104/58 (BP Location: Left Arm)   Pulse 67   Temp 97.8 F (36.6 C) (Oral)   Resp 18   Ht 6\' 2"  (1.88 m)    Wt 84.1 kg   SpO2 95%   BMI 23.80 kg/m   Physical Exam: General: No acute respiratory distress Lungs: Clear to auscultation bilaterally without wheezes or crackles Cardiovascular: Regular rate and rhythm without murmur gallop or rub normal S1 and S2 Abdomen: Nontender, nondistended, soft, bowel sounds positive, no rebound, no ascites, no appreciable mass Extremities: No significant cyanosis, clubbing, or edema bilateral lower extremities  Basic Metabolic Panel: Recent Labs  Lab 05/19/18 0833 05/19/18 0847 05/20/18 0243 05/21/18 0235  NA 141 140 140 138  K 3.9 3.8 4.1 4.2  CL 101  --  99 97*  CO2 22  --  24 24  GLUCOSE 96  --  94 90  BUN 35*  --  42* 50*  CREATININE 10.85* 11.50* 12.65* 14.59*  CALCIUM 9.0  --  9.1 8.7*  MG 1.9  --  2.1 2.2  PHOS  --   --  9.2*  --     Liver Function Tests: Recent Labs  Lab 05/21/18 0235  AST 8*  ALT 7  ALKPHOS 42  BILITOT 1.0  PROT 5.9*  ALBUMIN 3.0*    Coags: Recent Labs  Lab 05/19/18 0833 05/20/18 0243 05/21/18 0235  INR 2.69 3.03 3.15    CBC: Recent Labs  Lab 05/19/18 0833 05/19/18 0847 05/20/18 0243  WBC 8.0  --  6.7  HGB 13.4 13.6 11.7*  HCT 42.7 40.0 36.8*  MCV 100.0  --  97.9  PLT 145*  --  133*    Cardiac Enzymes: Recent Labs  Lab 05/19/18 1355 05/19/18 2050 05/20/18 0243  TROPONINI 0.06* 0.10* 0.13*    CBG: Recent Labs  Lab 05/19/18 0905  GLUCAP 92    Recent Results (from the past 240 hour(s))  MRSA PCR Screening     Status: None   Collection Time: 05/19/18  4:03 PM  Result Value Ref Range Status   MRSA by PCR NEGATIVE NEGATIVE Final    Comment:        The GeneXpert MRSA Assay (FDA approved for NASAL specimens only), is one component of a comprehensive MRSA colonization surveillance program. It is not intended to diagnose MRSA infection nor to guide or monitor treatment for MRSA infections. Performed at [facility], [place] [address], [place], [place] [address]      Time  spent in discharge (includes decision making & examination of pt): 30 minutes  05/21/2018, 4:22 PM   Cherene Altes, MD [facility] Office  [phone] Pager [phone]  On-Call/Text Page:      [email or web address]      password [facility]

## 2018-05-24 DIAGNOSIS — Z992 Dependence on renal dialysis: Secondary | ICD-10-CM | POA: Diagnosis not present

## 2018-05-24 DIAGNOSIS — N2581 Secondary hyperparathyroidism of renal origin: Secondary | ICD-10-CM | POA: Diagnosis not present

## 2018-05-24 DIAGNOSIS — N186 End stage renal disease: Secondary | ICD-10-CM | POA: Diagnosis not present

## 2018-05-26 DIAGNOSIS — Z992 Dependence on renal dialysis: Secondary | ICD-10-CM | POA: Diagnosis not present

## 2018-05-26 DIAGNOSIS — N186 End stage renal disease: Secondary | ICD-10-CM | POA: Diagnosis not present

## 2018-05-26 DIAGNOSIS — N2581 Secondary hyperparathyroidism of renal origin: Secondary | ICD-10-CM | POA: Diagnosis not present

## 2018-05-29 DIAGNOSIS — N2581 Secondary hyperparathyroidism of renal origin: Secondary | ICD-10-CM | POA: Diagnosis not present

## 2018-05-29 DIAGNOSIS — N186 End stage renal disease: Secondary | ICD-10-CM | POA: Diagnosis not present

## 2018-05-29 DIAGNOSIS — Z992 Dependence on renal dialysis: Secondary | ICD-10-CM | POA: Diagnosis not present

## 2018-05-31 DIAGNOSIS — I482 Chronic atrial fibrillation, unspecified: Secondary | ICD-10-CM | POA: Diagnosis not present

## 2018-05-31 DIAGNOSIS — N2581 Secondary hyperparathyroidism of renal origin: Secondary | ICD-10-CM | POA: Diagnosis not present

## 2018-05-31 DIAGNOSIS — N186 End stage renal disease: Secondary | ICD-10-CM | POA: Diagnosis not present

## 2018-05-31 DIAGNOSIS — Z992 Dependence on renal dialysis: Secondary | ICD-10-CM | POA: Diagnosis not present

## 2018-06-03 DIAGNOSIS — Z992 Dependence on renal dialysis: Secondary | ICD-10-CM | POA: Diagnosis not present

## 2018-06-03 DIAGNOSIS — N186 End stage renal disease: Secondary | ICD-10-CM | POA: Diagnosis not present

## 2018-06-03 DIAGNOSIS — I120 Hypertensive chronic kidney disease with stage 5 chronic kidney disease or end stage renal disease: Secondary | ICD-10-CM | POA: Diagnosis not present

## 2018-06-03 NOTE — Progress Notes (Signed)
Cardiology Office Note   Date:  06/04/2018   ID:  Corey [name], DOB November 23, 1970, MRN [id number]  PCP:  Clent Demark, PA-C  Cardiologist:  Dr. [name]    Chief Complaint  Patient presents with  . Atrial Fibrillation      History of Present Illness: Corey [name] is a 48 y.o. male who presents for post hospital for a fib and a flutter.   He has hx of P.atrial fibrillation with end-stage renal disease on hemodialysis, type B stable aortic dissection with original aortic dissection in 2004 with extensive repair in [place].  He has a history of atrial fibrillation on anticoagulation, transfer from [place]. Has a prior aortic dissection repair on hemodialysis since 2004.  Repaired aorta (remembers having veins out of legs as well).  In 2014 had atrial fibrillation, remembers having cardioversion, heart rate was 140 beats at the time.   He describes his original aorta dissected quite vividly, remembers eating dinner, cleaning the bowl he states and then feeling an extensive excruciating pain in his back like a "pop ".   Prolonged hospitalization he states 2 years, 3 months in a coma. He was paralyzed from the waist down. Had a pneumothorax as well.  He had another hospitalization here in October 2018.  Minimally elevated troponin.  CT scan showed stable type B aortic dissection originating distal from left subclavian.  Today he is doing quite well.  He will occasionally get a fleeting 2-minute left chest wall type discomfort.  Nonexertional.  No fevers chills nausea vomiting.  Post hospitalization in November 2018 he had a repeat CT scan which showed stable type B dissection  EF on recent Echo 60-65%,  Recent hospitalization  For A fib - went into a fib RVR followed by a flutter.  Then a fib.  HR 130s.  IV dilt without much change placed on IV amio and then po.  400 mg BID, on coumadin and INR therapeutic at discharge.  Troponin elevated with suspected demand ischemia in setting  of rapid HR. His type B dissection was stable. He reported frequent bouts of RVR which he is usually able to control w/ extra doses of labetalol  Was in SR at discharge.   ESRD t/t/s  amio 400 mg BID X 7 days then 400 mg daily.    Today he feels well.  His BP does drop at times with dialysis.  He has had no further PAF.  He feels better.no chest pain.  He is concerned about dissection and has no see Dr. Prescott Gum recently. No SOB.      Past Medical History:  Diagnosis Date  . Arthritis    hands and shoulders  . Blindness and low vision    "Stargardt disease"  . Dissection of aorta ([facility]) 2004   a. s/p extensive repeair in 9562 in [place] complicated by ESRD, lower extremity paralysis, coma, and extended hospitalization of 2 years  . ESRD (end stage renal disease) ([facility])    a. TTS  . Headache   . History of cardioversion 2014  . Hypertension   . Neuropathy   . Non-healing non-surgical wound 03/2016  . PAF (paroxysmal atrial fibrillation) ([facility])    a. s/p DCCV in 2014; b. on Coumadin; c. CHADS2VASc => 2 (HTN, vascular disease)  . Paralysis ([facility])    due to dissection of aorta in 2004, lower extremities  . Pneumonia     Past Surgical History:  Procedure Laterality Date  . APPLICATION OF  WOUND VAC Left 04/13/2016   Procedure: APPLICATION OF WOUND VAC;  Surgeon: Conrad Cross Lanes, MD;  Location: [facility];  Service: Vascular;  Laterality: Left;  . APPLICATION OF WOUND VAC Left 04/18/2016   Procedure: APPLICATION OF WOUND VAC;  Surgeon: Waynetta Sandy, MD;  Location: [facility];  Service: Vascular;  Laterality: Left;  Wound vac change   . APPLICATION OF WOUND VAC Left 04/20/2016   Procedure: WOUND VAC CHANGE;  Surgeon: Conrad Leesville, MD;  Location: [facility];  Service: Vascular;  Laterality: Left;  . AV FISTULA PLACEMENT    . BASCILIC VEIN TRANSPOSITION Left 12/09/2015   Procedure: FIRST STAGE BASILIC VEIN TRANSPOSITION LEFT UPPER ARM;  Surgeon: Conrad Westport, MD;  Location: [facility];  Service: Vascular;   Laterality: Left;  . BASCILIC VEIN TRANSPOSITION Left 03/09/2016   Procedure: SECOND STAGE BASILIC VEIN TRANSPOSITION WITH REVISION OF ANASTOMOSIS LEFT UPPER ARM;  Surgeon: Conrad [place], MD;  Location: [facility];  Service: Vascular;  Laterality: Left;  . CARDIOVERSION    . REPAIR OF ACUTE ASCENDING THORACIC AORTIC DISSECTION    . REVISON OF ARTERIOVENOUS FISTULA Left 04/20/2016   Procedure: LIGATION OF BASILIC VEIN TRANSPOSITION;  Surgeon: Conrad Killeen, MD;  Location: [facility];  Service: Vascular;  Laterality: Left;  . WOUND DEBRIDEMENT Left 04/13/2016   Procedure: DEBRIDEMENT WOUND;  Surgeon: Conrad [place], MD;  Location: [facility];  Service: Vascular;  Laterality: Left;     Current Outpatient Medications  Medication Sig Dispense Refill  . amiodarone (PACERONE) 200 MG tablet Take 1 tablet (200 mg total) by mouth daily. 90 tablet 3  . metoprolol succinate (TOPROL-XL) 25 MG 24 hr tablet Take 1 tablet (25 mg total) by mouth 2 (two) times daily with a meal. 60 tablet 0  . Multiple Vitamins-Minerals (MULTIVITAMIN WITH MINERALS) tablet Take 1 tablet by mouth daily.    . sevelamer carbonate (RENVELA) 0.8 g PACK packet Take 1.6 g by mouth 3 (three) times daily with meals. 270 each 0  . warfarin (COUMADIN) 5 MG tablet Take as directed by coumadin clinic (Patient taking differently: Take 5-7.5 mg by mouth daily at 6 PM. 7.5 mg every day except Monday & Friday patient takes 5 mg) 50 tablet 0   No current facility-administered medications for this visit.     Allergies:   Ciprofloxacin and Heparin    Social History:  The patient  reports that he has never smoked. He has never used smokeless tobacco. He reports that he does not drink alcohol or use drugs.   Family History:  The patient's family history includes Cancer in his father and mother; Hypertension in his father and mother; Stroke in his brother; Thyroid disease in his sister.    ROS:  General:no colds or fevers, + weight changes Skin:no rashes or  ulcers HEENT:no blurred vision, no congestion CV:see HPI PUL:see HPI GI:no diarrhea constipation or melena, no indigestion GU:no hematuria, no dysuria MS:no joint pain, no claudication Neuro:no syncope, no lightheadedness Endo:no diabetes, no thyroid disease  Wt Readings from Last 3 Encounters:  06/04/18 196 lb 1.9 oz (89 kg)  05/21/18 185 lb 6.5 oz (84.1 kg)  07/07/17 191 lb 3.2 oz (86.7 kg)     PHYSICAL EXAM: VS:  BP 112/62   Pulse 65   Ht 6\' 2"  (1.88 m)   Wt 196 lb 1.9 oz (89 kg)   SpO2 93%   BMI 25.18 kg/m  , BMI Body mass index is 25.18 kg/m. General:Pleasant affect,  NAD Skin:Warm and dry, brisk capillary refill HEENT:normocephalic, sclera clear, mucus membranes moist Neck:supple, no JVD, no bruits  Heart:S1S2 RRR without murmur, gallup, rub or click Lungs:clear without rales, rhonchi, or wheezes WRU:EAVW, non tender, + BS, do not palpate liver spleen or masses Ext:no lower ext edema, 2+ pedal pulses, 2+ radial pulses Neuro:alert and oriented X 3, MAE, follows commands, + facial symmetry    EKG:  EKG is ordered today. The ekg ordered today demonstrates SR at 68 1st degree AV block no acute changes    Recent Labs: 05/19/2018: TSH 1.088 05/20/2018: Hemoglobin 11.7; Platelets 133 05/21/2018: ALT 7; BUN 50; Creatinine, Ser 14.59; Magnesium 2.2; Potassium 4.2; Sodium 138    Lipid Panel    Component Value Date/Time   CHOL 150 05/20/2018 0243   TRIG 148 05/20/2018 0243   HDL 27 (L) 05/20/2018 0243   CHOLHDL 5.6 05/20/2018 0243   VLDL 30 05/20/2018 0243   LDLCALC 93 05/20/2018 0243       Other studies Reviewed: Additional studies/ records that were reviewed today include: . Echo 05/20/18 IMPRESSIONS    1. The left ventricle has normal systolic function with an ejection fraction of 60-65%. The cavity size was normal. There is mildly increased left ventricular wall thickness. Left ventricular diastology could not be evaluated due to nondiagnostic  images.  No evidence of left ventricular regional wall motion abnormalities.  2. The right ventricle has normal systolic function. The cavity was normal. There is no increase in right ventricular wall thickness. Right ventricular systolic pressure is moderately elevated with an estimated pressure of 42.8 mmHg.  3. The pericardial effusion is posterior to the left ventricle.  4. Trivial pericardial effusion.  5. The mitral valve is normal in structure.  6. The tricuspid valve is normal in structure.  7. The aortic valve is tricuspid Mild sclerosis of the aortic valve.  8. The pulmonic valve was normal in structure.  9. The inferior vena cava was dilated in size with >50% respiratory variability. 10. No evidence of left ventricular regional wall motion abnormalities. 11. Right atrial pressure is estimated at 8 mmHg.  FINDINGS  Left Ventricle: The left ventricle has normal systolic function, with an ejection fraction of 60-65%. The cavity size was normal. There is mildly increased left ventricular wall thickness. Left ventricular diastology could not be evaluated due to  nondiagnostic images. No evidence of left ventricular regional wall motion abnormalities.. Right Ventricle: The right ventricle has normal systolic function. The cavity was normal. There is no increase in right ventricular wall thickness. Right ventricular systolic pressure is moderately elevated with an estimated pressure of 42.8 mmHg. Left Atrium: left atrial size was normal in size Right Atrium: right atrial size was normal in size Right atrial pressure is estimated at 8 mmHg. Interatrial Septum: No atrial level shunt detected by color flow Doppler. Pericardium: Trivial pericardial effusion is present. The pericardial effusion is posterior to the left ventricle. Mitral Valve: The mitral valve is normal in structure. Mitral valve regurgitation is trivial by color flow Doppler. Tricuspid Valve: The tricuspid valve is normal in structure.  Tricuspid valve regurgitation is mild by color flow Doppler. Aortic Valve: The aortic valve is tricuspid Mild sclerosis of the aortic valve Aortic valve regurgitation was not visualized by color flow Doppler. Pulmonic Valve: The pulmonic valve was normal in structure. Pulmonic valve regurgitation is not visualized by color flow Doppler. Venous: The inferior vena cava is dilated in size with greater than 50% respiratory variability.  ASSESSMENT AND PLAN:  1.  PAF and atrial flutter.  Now on amiodarone - discussed with Dr. [name] and will decrease to 200 mg daily.  He is on anticoagulation with coumadin.    2.  Aortic dissection.  Repair 2004 he had residual partial paralysis last CT 2018 and stable type B dissection.  Will have him see Dr. Darcey Nora for follow up  3.  ESRD on HD T/T/S continue does have hypotension after dialysis.   Follow up with Dr. [name] in 6 months.  Will check TSH and liver with amiodarone.   Current medicines are reviewed with the patient today.  The patient Has no concerns regarding medicines.  The following changes have been made:  See above Labs/ tests ordered today include:see above  Disposition:   FU:  see above  Signed, Cecilie Kicks, NP  06/04/2018 5:19 PM    [facility], [place], [place], [place] Phone: [phone]; Fax: [phone]

## 2018-06-04 ENCOUNTER — Ambulatory Visit (INDEPENDENT_AMBULATORY_CARE_PROVIDER_SITE_OTHER): Payer: Medicare Other | Admitting: Cardiology

## 2018-06-04 ENCOUNTER — Encounter: Payer: Self-pay | Admitting: Cardiology

## 2018-06-04 VITALS — BP 112/62 | HR 65 | Ht 74.0 in | Wt 196.1 lb

## 2018-06-04 DIAGNOSIS — Z79899 Other long term (current) drug therapy: Secondary | ICD-10-CM | POA: Diagnosis not present

## 2018-06-04 DIAGNOSIS — N186 End stage renal disease: Secondary | ICD-10-CM

## 2018-06-04 DIAGNOSIS — I716 Thoracoabdominal aortic aneurysm, without rupture, unspecified: Secondary | ICD-10-CM

## 2018-06-04 DIAGNOSIS — I4891 Unspecified atrial fibrillation: Secondary | ICD-10-CM | POA: Diagnosis not present

## 2018-06-04 MED ORDER — AMIODARONE HCL 200 MG PO TABS: 200.0000 mg | ORAL_TABLET | Freq: Every day | ORAL | 3 refills | Status: DC

## 2018-06-04 NOTE — Patient Instructions (Addendum)
Your physician has recommended you make the following change in your medication:  DECREASE AMIODARONE TO 200 MG DAILY   Your physician recommends that you return for lab work in:  [facility] TSH  Your physician recommends that you schedule a follow-up appointment in:  4 MONTHS WITH DR [name] AND DR VAN TRIGT F/U ON AORTIC DISSECTION

## 2018-06-05 DIAGNOSIS — N186 End stage renal disease: Secondary | ICD-10-CM | POA: Diagnosis not present

## 2018-06-05 DIAGNOSIS — D509 Iron deficiency anemia, unspecified: Secondary | ICD-10-CM | POA: Diagnosis not present

## 2018-06-05 DIAGNOSIS — Z992 Dependence on renal dialysis: Secondary | ICD-10-CM | POA: Diagnosis not present

## 2018-06-05 DIAGNOSIS — N2581 Secondary hyperparathyroidism of renal origin: Secondary | ICD-10-CM | POA: Diagnosis not present

## 2018-06-06 ENCOUNTER — Emergency Department (HOSPITAL_COMMUNITY): Payer: Medicare Other

## 2018-06-06 ENCOUNTER — Emergency Department (HOSPITAL_COMMUNITY)
Admission: EM | Admit: 2018-06-06 | Discharge: 2018-06-06 | Disposition: A | Discharge status: Home / Self Care | Payer: Medicare Other | Attending: Emergency Medicine | Admitting: Emergency Medicine

## 2018-06-06 ENCOUNTER — Other Ambulatory Visit: Payer: Self-pay

## 2018-06-06 ENCOUNTER — Encounter (HOSPITAL_COMMUNITY): Payer: Self-pay | Admitting: Emergency Medicine

## 2018-06-06 ENCOUNTER — Telehealth (HOSPITAL_COMMUNITY): Payer: Self-pay | Admitting: *Deleted

## 2018-06-06 DIAGNOSIS — Z79899 Other long term (current) drug therapy: Secondary | ICD-10-CM | POA: Diagnosis not present

## 2018-06-06 DIAGNOSIS — Z992 Dependence on renal dialysis: Secondary | ICD-10-CM | POA: Insufficient documentation

## 2018-06-06 DIAGNOSIS — I480 Paroxysmal atrial fibrillation: Secondary | ICD-10-CM | POA: Diagnosis not present

## 2018-06-06 DIAGNOSIS — I132 Hypertensive heart and chronic kidney disease with heart failure and with stage 5 chronic kidney disease, or end stage renal disease: Secondary | ICD-10-CM | POA: Diagnosis not present

## 2018-06-06 DIAGNOSIS — I5032 Chronic diastolic (congestive) heart failure: Secondary | ICD-10-CM | POA: Diagnosis not present

## 2018-06-06 DIAGNOSIS — Z7901 Long term (current) use of anticoagulants: Secondary | ICD-10-CM | POA: Diagnosis not present

## 2018-06-06 DIAGNOSIS — R002 Palpitations: Secondary | ICD-10-CM | POA: Diagnosis present

## 2018-06-06 DIAGNOSIS — R52 Pain, unspecified: Secondary | ICD-10-CM | POA: Diagnosis not present

## 2018-06-06 DIAGNOSIS — I499 Cardiac arrhythmia, unspecified: Secondary | ICD-10-CM | POA: Diagnosis not present

## 2018-06-06 DIAGNOSIS — N186 End stage renal disease: Secondary | ICD-10-CM | POA: Insufficient documentation

## 2018-06-06 DIAGNOSIS — R000 Tachycardia, unspecified: Secondary | ICD-10-CM | POA: Diagnosis not present

## 2018-06-06 DIAGNOSIS — I959 Hypotension, unspecified: Secondary | ICD-10-CM | POA: Diagnosis not present

## 2018-06-06 DIAGNOSIS — I4891 Unspecified atrial fibrillation: Secondary | ICD-10-CM | POA: Diagnosis not present

## 2018-06-06 LAB — CBC WITH DIFFERENTIAL/PLATELET
Abs Immature Granulocytes: 0.03 10*3/uL (ref 0.00–0.07)
Basophils Absolute: 0 10*3/uL (ref 0.0–0.1)
Basophils Relative: 0 %
Eosinophils Absolute: 0.1 10*3/uL (ref 0.0–0.5)
Eosinophils Relative: 1 %
HCT: 42.4 % (ref 39.0–52.0)
Hemoglobin: 13 g/dL (ref 13.0–17.0)
Immature Granulocytes: 0 %
Lymphocytes Relative: 10 %
Lymphs Abs: 0.7 10*3/uL (ref 0.7–4.0)
MCH: 30.7 pg (ref 26.0–34.0)
MCHC: 30.7 g/dL (ref 30.0–36.0)
MCV: 100.2 fL — ABNORMAL HIGH (ref 80.0–100.0)
Monocytes Absolute: 0.6 10*3/uL (ref 0.1–1.0)
Monocytes Relative: 9 %
Neutro Abs: 5.6 10*3/uL (ref 1.7–7.7)
Neutrophils Relative %: 80 %
Platelets: 100 10*3/uL — ABNORMAL LOW (ref 150–400)
RBC: 4.23 MIL/uL (ref 4.22–5.81)
RDW: 15.5 % (ref 11.5–15.5)
WBC: 7.1 10*3/uL (ref 4.0–10.5)
nRBC: 0 % (ref 0.0–0.2)

## 2018-06-06 LAB — BASIC METABOLIC PANEL
Anion gap: 19 — ABNORMAL HIGH (ref 5–15)
BUN: 30 mg/dL — ABNORMAL HIGH (ref 6–20)
CO2: 24 mmol/L (ref 22–32)
Calcium: 9.1 mg/dL (ref 8.9–10.3)
Chloride: 98 mmol/L (ref 98–111)
Creatinine, Ser: 9.04 mg/dL — ABNORMAL HIGH (ref 0.61–1.24)
GFR calc Af Amer: 7 mL/min — ABNORMAL LOW (ref >60)
GFR calc non Af Amer: 6 mL/min — ABNORMAL LOW (ref >60)
Glucose, Bld: 92 mg/dL (ref 70–99)
Potassium: 3.6 mmol/L (ref 3.5–5.1)
Sodium: 141 mmol/L (ref 135–145)

## 2018-06-06 LAB — PROTIME-INR
INR: 3.2 — ABNORMAL HIGH (ref 0.8–1.2)
Prothrombin Time: 32.2 seconds — ABNORMAL HIGH (ref 11.4–15.2)

## 2018-06-06 LAB — TROPONIN I: Troponin I: 0.04 ng/mL (ref <0.03)

## 2018-06-06 MED ORDER — DILTIAZEM HCL 25 MG/5ML IV SOLN
10.0000 mg | Freq: Once | INTRAVENOUS | Status: DC
  Filled 2018-06-06: qty 5

## 2018-06-06 MED ORDER — SODIUM CHLORIDE 0.9 % IV BOLUS
250.0000 mL | Freq: Once | INTRAVENOUS | Status: AC
  Administered 2018-06-06: 250 mL via INTRAVENOUS

## 2018-06-06 MED ORDER — DILTIAZEM HCL-DEXTROSE 100-5 MG/100ML-% IV SOLN (PREMIX)
5.0000 mg/h | INTRAVENOUS | Status: DC
  Administered 2018-06-06: 5 mg/h via INTRAVENOUS
  Filled 2018-06-06: qty 100

## 2018-06-06 MED ORDER — METOPROLOL TARTRATE 25 MG PO TABS
50.0000 mg | ORAL_TABLET | Freq: Once | ORAL | Status: AC
  Administered 2018-06-06: 50 mg via ORAL
  Filled 2018-06-06 (×2): qty 2

## 2018-06-06 NOTE — Discharge Instructions (Addendum)
Please read the attached information.  Your heart has gone back into its normal rhythm.  If you have recurrence of your symptoms please return to the emergency room.  Please contact the A. fib clinic and schedule follow-up appointment with the next 1 to 2 days.  Also follow-up at the Coumadin clinic given you are slightly supratherapeutic.  Return for any questions or concerns.  Please resume your daily medications tomorrow.

## 2018-06-06 NOTE — ED Provider Notes (Signed)
[facility] EMERGENCY DEPARTMENT Provider Note   CSN: [id number] Arrival date & time: 06/06/18  8144    History   Chief Complaint Chief Complaint  Patient presents with  . Chest Pain  . Atrial Fibrillation    HPI Corey [name] is a 48 y.o. male.     HPI   48 y.o.malewitha hx ofblindness, paroxysmal Afib,ESRDon HD TTS, HA, HTN, paralysis of lower extremities, calciphylaxis of LUE, Stargardt disease, acute(2019)on chronic (repaired in 2004,[place])typeB thoracoabdominal aortic dissectionwho presented w/palpitationsthat woke him up from sleep at 3 am.  He reports that yesterday he felt he was in normal sinus rhythm.  Approximate 3 AM he woke up with rapid racing heart.  He notes he took an amiodarone upon awakening and called EMS.  When EMS arrived he was tachycardic into the 140s to 160s, and hypotensive with a systolic blood pressure 70.  He was given 500 mL of normal saline which improved his blood pressure to the low 100s.  Patient notes that he feels improved over this morning but still feels weak.  He denies any specific chest pain but does note some discomfort in the chest.  Per chart review patient has had several episodes of the same.  Upon his most recent hospital admission his presentation was very similar.  They had difficulty controlling his rate secondary to hypotension making use of metoprolol, Cardizem difficult to initiate.  They did discuss cardioversion as he has had cardioversion in the past but thought this likely would be not beneficial given his frequent recurrence.  Patient has been notably subtherapeutic previously, he notes he has been taking his Coumadin daily.  Patient did receive his dialysis yesterday.  Past Medical History:  Diagnosis Date  . Arthritis    hands and shoulders  . Blindness and low vision    "Stargardt disease"  . Dissection of aorta ([facility]) 2004   a. s/p extensive repeair in 8185 in [place] complicated by ESRD, lower  extremity paralysis, coma, and extended hospitalization of 2 years  . ESRD (end stage renal disease) ([facility])    a. TTS  . Headache   . History of cardioversion 2014  . Hypertension   . Neuropathy   . Non-healing non-surgical wound 03/2016  . PAF (paroxysmal atrial fibrillation) ([facility])    a. s/p DCCV in 2014; b. on Coumadin; c. CHADS2VASc => 2 (HTN, vascular disease)  . Paralysis ([facility])    due to dissection of aorta in 2004, lower extremities  . Pneumonia     Patient Active Problem List   Diagnosis Date Noted  . Atrial fibrillation with tachycardic ventricular rate ([facility]) 05/19/2018  . Dissection of thoracic aorta ([facility])   . Essential hypertension   . Chronic diastolic CHF (congestive heart failure) ([facility])   . Atrial fibrillation, chronic   . End-stage renal disease on hemodialysis ([facility])   . Thrombocytopenia ([facility])   . Peripheral neuropathy   . Thoracoabdominal aortic aneurysm (TAAA) without rupture ([facility])   . Chest pain 01/18/2017  . Long term (current) use of anticoagulants [Z79.01] 12/30/2016  . Calciphylaxis 05/06/2016  . Arm wound, left, sequela 04/28/2016  . Nonhealing surgical wound 04/13/2016  . ESRD on dialysis ([facility]) 02/19/2016  . Atrial fibrillation ([facility]) [I48.91] 12/23/2015    Past Surgical History:  Procedure Laterality Date  . APPLICATION OF WOUND VAC Left 04/13/2016   Procedure: APPLICATION OF WOUND VAC;  Surgeon: Conrad Arthur, MD;  Location: [facility];  Service: Vascular;  Laterality: Left;  . APPLICATION OF  WOUND VAC Left 04/18/2016   Procedure: APPLICATION OF WOUND VAC;  Surgeon: Waynetta Sandy, MD;  Location: [facility];  Service: Vascular;  Laterality: Left;  Wound vac change   . APPLICATION OF WOUND VAC Left 04/20/2016   Procedure: WOUND VAC CHANGE;  Surgeon: Conrad [place], MD;  Location: [facility];  Service: Vascular;  Laterality: Left;  . AV FISTULA PLACEMENT    . BASCILIC VEIN TRANSPOSITION Left 12/09/2015   Procedure: FIRST STAGE BASILIC VEIN TRANSPOSITION LEFT UPPER  ARM;  Surgeon: Conrad Fowler, MD;  Location: [facility];  Service: Vascular;  Laterality: Left;  . BASCILIC VEIN TRANSPOSITION Left 03/09/2016   Procedure: SECOND STAGE BASILIC VEIN TRANSPOSITION WITH REVISION OF ANASTOMOSIS LEFT UPPER ARM;  Surgeon: Conrad Tohatchi, MD;  Location: [facility];  Service: Vascular;  Laterality: Left;  . CARDIOVERSION    . REPAIR OF ACUTE ASCENDING THORACIC AORTIC DISSECTION    . REVISON OF ARTERIOVENOUS FISTULA Left 04/20/2016   Procedure: LIGATION OF BASILIC VEIN TRANSPOSITION;  Surgeon: Conrad Kearns, MD;  Location: [facility];  Service: Vascular;  Laterality: Left;  . WOUND DEBRIDEMENT Left 04/13/2016   Procedure: DEBRIDEMENT WOUND;  Surgeon: Conrad Clarkton, MD;  Location: [facility];  Service: Vascular;  Laterality: Left;        Home Medications    Prior to Admission medications   Medication Sig Start Date End Date Taking? Authorizing Provider  metoprolol succinate (TOPROL-XL) 25 MG 24 hr tablet Take 1 tablet (25 mg total) by mouth 2 (two) times daily with a meal. 05/21/18  Yes Cherene Altes, MD  Multiple Vitamins-Minerals (MULTIVITAMIN WITH MINERALS) tablet Take 1 tablet by mouth daily.   Yes [provider]  sevelamer carbonate (RENVELA) 0.8 g PACK packet Take 1.6 g by mouth 3 (three) times daily with meals. Patient taking differently: Take 0.8-1.6 g by mouth See admin instructions. Take 1.6g with meals and 0.8g with snacks 01/26/17  Yes Allie Bossier, MD  warfarin (COUMADIN) 5 MG tablet Take as directed by coumadin clinic Patient taking differently: Take 5 mg by mouth daily.  08/21/17  Yes Jerline Pain, MD  amiodarone (PACERONE) 200 MG tablet Take 1 tablet (200 mg total) by mouth daily. Patient not taking: Reported on 06/06/2018 06/04/18   Isaiah Serge, NP    Family History Family History  Problem Relation Age of Onset  . Cancer Mother   . Hypertension Mother   . Cancer Father   . Hypertension Father   . Thyroid disease Sister   . Stroke Brother        26      Social History Social History   Tobacco Use  . Smoking status: Never Smoker  . Smokeless tobacco: Never Used  Substance Use Topics  . Alcohol use: No  . Drug use: No     Allergies   Ciprofloxacin and Heparin   Review of Systems Review of Systems  All other systems reviewed and are negative.   Physical Exam Updated Vital Signs BP 97/65   Pulse 60   Temp 98.6 F (37 C) (Oral)   Resp 15   Ht 6\' 2"  (1.88 m)   Wt 88.9 kg   SpO2 95%   BMI 25.16 kg/m   Physical Exam Vitals signs and nursing note reviewed.  Constitutional:      Appearance: He is well-developed.  HENT:     Head: Normocephalic and atraumatic.  Eyes:     General: No scleral icterus.  Right eye: No discharge.        Left eye: No discharge.     Conjunctiva/sclera: Conjunctivae normal.     Pupils: Pupils are equal, round, and reactive to light.  Neck:     Musculoskeletal: Normal range of motion.     Vascular: No JVD.     Trachea: No tracheal deviation.  Cardiovascular:     Rate and Rhythm: Tachycardia present. Rhythm irregular.     Pulses: Normal pulses.  Pulmonary:     Effort: Pulmonary effort is normal.     Breath sounds: No stridor.  Neurological:     Mental Status: He is alert and oriented to person, place, and time.     Coordination: Coordination normal.  Psychiatric:        Behavior: Behavior normal.        Thought Content: Thought content normal.        Judgment: Judgment normal.     ED Treatments / Results  Labs (all labs ordered are listed, but only abnormal results are displayed) Labs Reviewed  CBC WITH DIFFERENTIAL/PLATELET - Abnormal; Notable for the following components:      Result Value   MCV 100.2 (*)    Platelets 100 (*)    All other components within normal limits  BASIC METABOLIC PANEL - Abnormal; Notable for the following components:   BUN 30 (*)    Creatinine, Ser 9.04 (*)    GFR calc non Af Amer 6 (*)    GFR calc Af Amer 7 (*)    Anion gap 19 (*)     All other components within normal limits  PROTIME-INR - Abnormal; Notable for the following components:   Prothrombin Time 32.2 (*)    INR 3.2 (*)    All other components within normal limits  TROPONIN I - Abnormal; Notable for the following components:   Troponin I 0.04 (*)    All other components within normal limits    EKG EKG Interpretation  Date/Time:  Wednesday June 06 2018 06:29:20 EST Ventricular Rate:  116 PR Interval:    QRS Duration: 90 QT Interval:  374 QTC Calculation: 520 R Axis:   -41 Text Interpretation:  Atrial flutter Left axis deviation Low voltage, precordial leads Anteroseptal infarct, old Prolonged QT interval Confirmed by Ripley Fraise [phone]) on 06/06/2018 6:36:48 AM   Radiology Dg Chest 2 View  Result Date: 06/06/2018 CLINICAL DATA:  Atrial fibrillation with rapid response and chest discomfort EXAM: CHEST - 2 VIEW COMPARISON:  05/19/2018 FINDINGS: Chronic cardiomegaly and aortic tortuosity. There is known thoracic aortic dissection by 2018 CT. Chronic scarring at the right base. Interstitial coarsening, stable. Venous stenting in the right upper extremity. Dialysis catheter with tips at the right atrium. IMPRESSION: No acute finding when compared to prior. Electronically Signed   By: Monte Fantasia M.D.   On: 06/06/2018 07:36    Procedures Procedures (including critical care time)  CRITICAL CARE Performed by: Stevie Kern Aubriegh Minch   Total critical care time: 45 minutes  Critical care time was exclusive of separately billable procedures and treating other patients.  Critical care was necessary to treat or prevent imminent or life-threatening deterioration.  Critical care was time spent personally by me on the following activities: development of treatment plan with patient and/or surrogate as well as nursing, discussions with consultants, evaluation of patient's response to treatment, examination of patient, obtaining history from patient or  surrogate, ordering and performing treatments and interventions, ordering and review of laboratory studies, ordering  and review of radiographic studies, pulse oximetry and re-evaluation of patient's condition.  Medications Ordered in ED Medications  metoprolol tartrate (LOPRESSOR) tablet 50 mg (50 mg Oral Given 06/06/18 0826)  sodium chloride 0.9 % bolus 250 mL (0 mLs Intravenous Stopped 06/06/18 0828)     Initial Impression / Assessment and Plan / ED Course  I have reviewed the triage vital signs and the nursing notes.  Pertinent labs & imaging results that were available during my care of the patient were reviewed by me and considered in my medical decision making (see chart for details).      Labs: CBC, BMP, PT/INR, troponin I  Imaging: Chest 2 view  Consults:  Therapeutics: Metoprolol, Cardizem  Discharge Meds:   Assessment/Plan: 48 year old male presents today with A. fib RVR.  He has some minor discomfort in his chest, I have very low suspicion for ACS likely secondary to RVR.  Presentation seems very similar to previous.  Patient is normotensive upon initial evaluation, we will initiate rate control with Cardizem and metoprolol with close monitoring of blood pressure.  We had successful rate control with Cardizem and metoprolol here.  Patient was ambulated with no chest pain shortness of breath or palpitations.  He is asymptomatic at this time given his return to normal sinus rhythm and no acute distress I feel patient is stable for outpatient follow-up.  He is slightly supratherapeutic, he will follow with the Coumadin clinic, he will contact the A. fib clinic and follow-up within the next 1 to 2 days.  He is given strict return precautions, he verbalized understanding and agreement to today's plan had no further questions or concerns at time of discharge.   Chads Vasc Score 2  Final Clinical Impressions(s) / ED Diagnoses   Final diagnoses:  Paroxysmal atrial fibrillation  [facility])    ED Discharge Orders         Ordered    Amb referral to AFIB Clinic     06/06/18 1124           Francee Gentile 06/06/18 [place], MD 06/08/18 [phone]

## 2018-06-06 NOTE — ED Triage Notes (Signed)
BIB EMS from home. Pt reports central chest tightness, starting around 0300 this morning. Per EMS, pt was in A-fib w/RVR as high as 160, and hypotensive (SBP 74). Given 510mL NS bolus with noted improvement to rate, BP, and chest tightness. Pt on coumadin. Dialysis MWF, and reports Monday's session having to be watched closely d/t hypotension.

## 2018-06-06 NOTE — ED Notes (Signed)
EDP aware of elevated trop

## 2018-06-06 NOTE — ED Notes (Signed)
Patient transported to X-ray 

## 2018-06-06 NOTE — Telephone Encounter (Signed)
cld pt to sched appt for follow up from the ED.  A man answered the phone and told me to call pt back in an hour or so because he was not home.  No msg was taken

## 2018-06-06 NOTE — ED Notes (Signed)
Patient verbalizes understanding of discharge instructions. Opportunity for questioning and answers were provided. Pt discharged from ED. 

## 2018-06-06 NOTE — ED Notes (Signed)
Pt ambulatory w/ steady gait w/ walker; denies chest discomfort/pain

## 2018-06-08 ENCOUNTER — Other Ambulatory Visit: Payer: Medicare Other

## 2018-06-09 DIAGNOSIS — N186 End stage renal disease: Secondary | ICD-10-CM | POA: Diagnosis not present

## 2018-06-09 DIAGNOSIS — Z992 Dependence on renal dialysis: Secondary | ICD-10-CM | POA: Diagnosis not present

## 2018-06-09 DIAGNOSIS — D509 Iron deficiency anemia, unspecified: Secondary | ICD-10-CM | POA: Diagnosis not present

## 2018-06-09 DIAGNOSIS — N2581 Secondary hyperparathyroidism of renal origin: Secondary | ICD-10-CM | POA: Diagnosis not present

## 2018-06-11 ENCOUNTER — Telehealth: Payer: Self-pay | Admitting: *Deleted

## 2018-06-11 DIAGNOSIS — I716 Thoracoabdominal aortic aneurysm, without rupture, unspecified: Secondary | ICD-10-CM

## 2018-06-11 NOTE — Telephone Encounter (Signed)
-----   Message from Shellia Cleverly, RN sent at 06/07/2018  9:36 AM EST ----- Regarding: FW: CTA Appears that in the past Dr Darcey Nora has ordered with and or without however would need to verify with his office.  ----- Message ----- From: Jeanann Lewandowsky, RMA Sent: 06/06/2018   7:01 AM EST To: Jerline Pain, MD, Shellia Cleverly, RN Subject: FW: CTA                                        See message below and advise! Thanks! ----- Message ----- From: Isaiah Serge, NP Sent: 06/05/2018   9:11 PM EST To: Jeanann Lewandowsky, RMA Subject: RE: CTA                                        You will need to ask them,  Dr. [name] said just to send to Dr. Darcey Nora.  We did not know if they wanted with or without contrast.   Thanks.  If they do not know I will ask Dr. Darcey Nora.    ----- Message ----- From: Jeanann Lewandowsky, RMA Sent: 06/05/2018   3:26 PM EST To: Isaiah Serge, NP Subject: CTA                                            Mack Guise from scheduling called and said they have pt scheduled to see TCTS 06/18/2018, but they are requiring a CTA?   Tell me what to order and for what and I will take care of it for ya.

## 2018-06-12 DIAGNOSIS — Z992 Dependence on renal dialysis: Secondary | ICD-10-CM | POA: Diagnosis not present

## 2018-06-12 DIAGNOSIS — N2581 Secondary hyperparathyroidism of renal origin: Secondary | ICD-10-CM | POA: Diagnosis not present

## 2018-06-12 DIAGNOSIS — D509 Iron deficiency anemia, unspecified: Secondary | ICD-10-CM | POA: Diagnosis not present

## 2018-06-12 DIAGNOSIS — N186 End stage renal disease: Secondary | ICD-10-CM | POA: Diagnosis not present

## 2018-06-13 ENCOUNTER — Ambulatory Visit (HOSPITAL_COMMUNITY)
Admission: RE | Admit: 2018-06-13 | Discharge: 2018-06-13 | Disposition: A | Discharge status: Home / Self Care | Payer: Medicare Other | Source: Ambulatory Visit | Attending: Cardiology | Admitting: Cardiology

## 2018-06-13 DIAGNOSIS — I712 Thoracic aortic aneurysm, without rupture: Secondary | ICD-10-CM | POA: Diagnosis not present

## 2018-06-13 DIAGNOSIS — I716 Thoracoabdominal aortic aneurysm, without rupture, unspecified: Secondary | ICD-10-CM

## 2018-06-13 MED ORDER — IOHEXOL 350 MG/ML SOLN
100.0000 mL | Freq: Once | INTRAVENOUS | Status: AC | PRN
  Administered 2018-06-13: 100 mL via INTRAVENOUS

## 2018-06-16 DIAGNOSIS — Z992 Dependence on renal dialysis: Secondary | ICD-10-CM | POA: Diagnosis not present

## 2018-06-16 DIAGNOSIS — N2581 Secondary hyperparathyroidism of renal origin: Secondary | ICD-10-CM | POA: Diagnosis not present

## 2018-06-16 DIAGNOSIS — D509 Iron deficiency anemia, unspecified: Secondary | ICD-10-CM | POA: Diagnosis not present

## 2018-06-16 DIAGNOSIS — N186 End stage renal disease: Secondary | ICD-10-CM | POA: Diagnosis not present

## 2018-06-18 ENCOUNTER — Encounter: Payer: Self-pay | Admitting: Cardiothoracic Surgery

## 2018-06-18 ENCOUNTER — Ambulatory Visit (INDEPENDENT_AMBULATORY_CARE_PROVIDER_SITE_OTHER): Payer: Medicare Other | Admitting: Cardiothoracic Surgery

## 2018-06-18 ENCOUNTER — Other Ambulatory Visit: Payer: Self-pay

## 2018-06-18 VITALS — BP 113/70 | HR 64 | Resp 16 | Ht 74.0 in | Wt 196.0 lb

## 2018-06-18 DIAGNOSIS — I716 Thoracoabdominal aortic aneurysm, without rupture, unspecified: Secondary | ICD-10-CM

## 2018-06-18 DIAGNOSIS — I71019 Dissection of thoracic aorta, unspecified: Secondary | ICD-10-CM

## 2018-06-18 DIAGNOSIS — Z993 Dependence on wheelchair: Secondary | ICD-10-CM

## 2018-06-18 DIAGNOSIS — I7101 Dissection of thoracic aorta: Secondary | ICD-10-CM | POA: Diagnosis not present

## 2018-06-18 DIAGNOSIS — G8220 Paraplegia, unspecified: Secondary | ICD-10-CM | POA: Diagnosis not present

## 2018-06-18 DIAGNOSIS — G9009 Other idiopathic peripheral autonomic neuropathy: Secondary | ICD-10-CM | POA: Diagnosis not present

## 2018-06-18 NOTE — Patient Instructions (Signed)

## 2018-06-18 NOTE — Progress Notes (Signed)
[address]       [place], [address]             [phone]                    Leith [facility]ns [place] Medical Record #[id number] Date of Birth: 01-Feb-1971  Referring: Isaiah Serge, NP Primary Care: Clent Demark, PA-C Primary Cardiologist: Candee Furbish, MD  Chief Complaint:    Chief Complaint  Patient presents with   Thoracic Aortic Dissection    f/u with CTA CHEST 06/13/18   Thoracic Aortic Aneurysm    History of Present Illness:    Corey [name] 48 y.o. male  Referred by cardiology after aortic dissection while living in [place] in 3428, complicated by paraplegia and severe neuropathy. Patient confined to wheel chair.  Comes in today with follow up ct of chest.      Current Activity/ Functional Status:  Patient is not independent with mobility/ambulation, transfers, ADL's, IADL's.   Zubrod Score: At the time of surgery this patients most appropriate activity status/level should be described as: []     0    Normal activity, no symptoms []     1    Restricted in physical strenuous activity but ambulatory, able to do out light work []     2    Ambulatory and capable of self care, unable to do work activities, up and about               >50 % of waking hours                              [x]     3    Only limited self care, in bed greater than 50% of waking hours []     4    Completely disabled, no self care, confined to bed or chair []     5    Moribund   Past Medical History:  Diagnosis Date   Arthritis    hands and shoulders   Blindness and low vision    "Stargardt disease"   Dissection of aorta ([facility]) 2004   a. s/p extensive repeair in 7681 in [place] complicated by ESRD, lower extremity paralysis, coma, and extended hospitalization of 2 years   ESRD (end stage renal disease) ([facility] [facility])    a. TTS   Headache    History of cardioversion 2014   Hypertension    Neuropathy    Non-healing non-surgical wound 03/2016   PAF (paroxysmal atrial  fibrillation) ([facility] [place])    a. s/p DCCV in 2014; b. on Coumadin; c. CHADS2VASc => 2 (HTN, vascular disease)   Paralysis ([place])    due to dissection of aorta in 2004, lower extremities   Pneumonia     Past Surgical History:  Procedure Laterality Date   APPLICATION OF WOUND VAC Left 04/13/2016   Procedure: APPLICATION OF WOUND VAC;  Surgeon: Conrad Gaston, MD;  Location: [facility];  Service: Vascular;  Laterality: Left;   APPLICATION OF WOUND VAC Left 04/18/2016   Procedure: APPLICATION OF WOUND VAC;  Surgeon: Waynetta Sandy, MD;  Location: [facility];  Service: Vascular;  Laterality: Left;  Wound vac change    APPLICATION OF WOUND VAC Left 04/20/2016   Procedure: WOUND VAC CHANGE;  Surgeon: Conrad Ronceverte, MD;  Location: [facility];  Service: Vascular;  Laterality: Left;   AV FISTULA PLACEMENT  BASCILIC VEIN TRANSPOSITION Left 12/09/2015   Procedure: FIRST STAGE BASILIC VEIN TRANSPOSITION LEFT UPPER ARM;  Surgeon: Conrad Northgate, MD;  Location: [facility];  Service: Vascular;  Laterality: Left;   [place] Left 03/09/2016   Procedure: SECOND STAGE BASILIC VEIN TRANSPOSITION WITH REVISION OF ANASTOMOSIS LEFT UPPER ARM;  Surgeon: Conrad Georgetown, MD;  Location: [facility];  Service: Vascular;  Laterality: Left;   CARDIOVERSION     REPAIR OF ACUTE ASCENDING THORACIC AORTIC DISSECTION     REVISON OF ARTERIOVENOUS FISTULA Left 04/20/2016   Procedure: LIGATION OF BASILIC VEIN TRANSPOSITION;  Surgeon: Conrad , MD;  Location: [facility];  Service: Vascular;  Laterality: Left;   WOUND DEBRIDEMENT Left 04/13/2016   Procedure: DEBRIDEMENT WOUND;  Surgeon: Conrad , MD;  Location: [facility] OR;  Service: Vascular;  Laterality: Left;    Family History  Problem Relation Age of Onset   Cancer Mother    Hypertension Mother    Cancer Father    Hypertension Father    Thyroid disease Sister    Stroke Brother        31     Social History   Tobacco Use  Smoking Status Never Smoker  Smokeless  Tobacco Never Used    Social History   Substance and Sexual Activity  Alcohol Use No     Allergies  Allergen Reactions   Ciprofloxacin Other (See Comments)    Aortic dissection   Heparin Other (See Comments)    UNSPECIFIED REACTION :  On Coumadin since 2004   HIT panel negative 01/19/17   Quinolones     Current Outpatient Medications  Medication Sig Dispense Refill   amiodarone (PACERONE) 200 MG tablet Take 1 tablet (200 mg total) by mouth daily. 90 tablet 3   metoprolol succinate (TOPROL-XL) 25 MG 24 hr tablet Take 1 tablet (25 mg total) by mouth 2 (two) times daily with a meal. 60 tablet 0   Multiple Vitamins-Minerals (MULTIVITAMIN WITH MINERALS) tablet Take 1 tablet by mouth daily.     sevelamer carbonate (RENVELA) 0.8 g PACK packet Take 1.6 g by mouth 3 (three) times daily with meals. (Patient taking differently: Take 0.8-1.6 g by mouth See admin instructions. Take 1.6g with meals and 0.8g with snacks) 270 each 0   warfarin (COUMADIN) 5 MG tablet Take as directed by coumadin clinic (Patient taking differently: Take 5 mg by mouth daily. ) 50 tablet 0   No current facility-administered medications for this visit.     Pertinent items are noted in HPI.   Review of Systems:     Cardiac Review of Systems: [Y] = yes  or   [ N ] = no   Chest Pain [ n   ]  Resting SOB [ n  ] Exertional SOB  [n  ]  Orthopnea [  ]   Pedal Edema [ y  ]    Palpitations [ n ] Syncope  [  n]   Presyncope [n   ] n General Review of Systems: [Y] = yes [  ]=no Constitional: recent weight change [  ];  Wt loss over the last 3 months [   ] anorexia [  ]; fatigue [  ]; nausea [  ]; night sweats [  ]; fever [  ]; or chills [  ];           Eye : blurred vision [  ]; diplopia [   ]; vision changes [  ];  Amaurosis fugax[  ]; Resp: cough [  ];  wheezing[  ];  hemoptysis[  ]; shortness of breath[  ]; paroxysmal nocturnal dyspnea[  ]; dyspnea on exertion[  ]; or orthopnea[  ];  GI:  gallstones[  ],  vomiting[  ];  dysphagia[  ]; melena[  ];  hematochezia [  ]; heartburn[  ];   Hx of  Colonoscopy[  ]; GU: kidney stones [  ]; hematuria[  ];   dysuria [  ];  nocturia[  ];  history of     obstruction [  ]; urinary frequency [  ]             Skin: rash, swelling[  ];, hair loss[  ];  peripheral edema[  ];  or itching[  ]; Musculosketetal: myalgias[  ];  joint swelling[  ];  joint erythema[  ];  joint pain[  ];  back pain[  ];  Heme/Lymph: bruising[  ];  bleeding[  ];  anemia[  ];  Neuro: TIA[  ];  headaches[  ];  stroke[  ];  vertigo[  ];  seizures[  ];   paresthesias[ y ];  difficulty walking[ y ];  Psych:depression[  ]; anxiety[  ];  Endocrine: diabetes[  ];  thyroid dysfunction[  ];  Immunizations: Flu up to date [  ]; Pneumococcal up to date [  ];  Other:     PHYSICAL EXAMINATION: BP 113/70 (BP Location: Right Arm, Patient Position: Sitting, Cuff Size: Large)    Pulse 64    Resp 16    Ht 6\' 2"  (1.88 m)    Wt 196 lb (88.9 kg)    BMI 25.16 kg/m  General appearance: alert, cooperative and appears older than stated age Head: Normocephalic, without obvious abnormality, atraumatic Resp: clear to auscultation bilaterally Cardio: regular rate and rhythm, S1, S2 normal, no murmur, click, rub or gallop GI: soft, non-tender; bowel sounds normal; no masses,  no organomegaly Lower extremities swollen Right IJ dialysis catheter In wheel chair with lower extremityweakness  Diagnostic Studies & Laboratory data:     Recent Radiology Findings:   Dg Chest 2 View  Result Date: 06/06/2018 CLINICAL DATA:  Atrial fibrillation with rapid response and chest discomfort EXAM: CHEST - 2 VIEW COMPARISON:  05/19/2018 FINDINGS: Chronic cardiomegaly and aortic tortuosity. There is known thoracic aortic dissection by 2018 CT. Chronic scarring at the right base. Interstitial coarsening, stable. Venous stenting in the right upper extremity. Dialysis catheter with tips at the right atrium. IMPRESSION: No acute  finding when compared to prior. Electronically Signed   By: Monte Fantasia M.D.   On: 06/06/2018 07:36   Ct Angio Chest Aorta W/cm &/or Wo/cm  Result Date: 06/13/2018 CLINICAL DATA:  Thoracic aortic aneurysm, chronic back pain EXAM: CT ANGIOGRAPHY CHEST WITH CONTRAST TECHNIQUE: Multidetector CT imaging of the chest was performed using the standard protocol during bolus administration of intravenous contrast. Multiplanar CT image reconstructions and MIPs were obtained to evaluate the vascular anatomy. CONTRAST:  162mL OMNIPAQUE IOHEXOL 350 MG/ML SOLN COMPARISON:  03/01/2017 FINDINGS: Cardiovascular: Right IJ hemodialysis catheter extends to the mid RA. Mild cardiomegaly. No pericardial effusion. Satisfactory opacification of pulmonary arteries noted, and there is no evidence of pulmonary emboli. Scattered coronary calcifications. Good contrast opacification of the thoracic aorta. Ascending segment unremarkable. Classic 3 vessel brachiocephalic arterial origin anatomy without proximal stenosis. Dissection in the distal arch beyond the origin of the left subclavian artery, extending into the visualized proximal abdominal aorta. True and false lumens  remain patent. Aneurysmal fusiform dilatation of the distal arch up to 4.6 cm diameter (previously 3.8), mid descending 4.2 cm, distal descending 3.7 cm. There is narrowing of the true lumen by the false lumen in the distal descending and proximal abdominal segments. Celiac axis is supplied by the true lumen. Mediastinum/Nodes: No hilar or mediastinal adenopathy. Lungs/Pleura: No pleural effusion. No pneumothorax. Linear scarring or atelectasis in the lung bases, right greater than left. Septal thickening peripherally in both lungs suggesting interstitial edema. Upper Abdomen: Distal extent of aortic dissection not visualized. Multiple right renal cysts. No acute findings. Musculoskeletal: Chronic coarse dural calcifications in the visualized thoracic and lumbar  segments as before. Sclerotic vertebral body changes consistent with chronic renal disease. Negative for fracture or worrisome bone lesion. Review of the MIP images confirms the above findings. IMPRESSION: 1. Persistent type B aortic dissection from the distal arch into proximal abdominal aorta, distal extent not visualized. 2. 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 Recommend semi-annual imaging followup by CTA or MRA and referral to cardiothoracic surgery if not already obtained. This recommendation follows 2010 ACCF/AHA/AATS/ACR/ASA/SCA/SCAI/SIR/STS/SVM Guidelines for the Diagnosis and Management of Patients With Thoracic Aortic Disease. Circulation. 2010; 121: C947-S96. Aortic aneurysm NOS (ICD10-I71.9) 3. Coronary and Aortic Atherosclerosis (ICD10-170.0). Electronically Signed   By: Lucrezia Europe M.D.   On: 06/13/2018 11:51     I have independently reviewed the above radiology studies  and reviewed the findings with the patient.   Recent Lab Findings: Lab Results  Component Value Date   WBC 7.1 06/06/2018   HGB 13.0 06/06/2018   HCT 42.4 06/06/2018   PLT 100 (L) 06/06/2018   GLUCOSE 92 06/06/2018   CHOL 150 05/20/2018   TRIG 148 05/20/2018   HDL 27 (L) 05/20/2018   LDLCALC 93 05/20/2018   ALT 7 05/21/2018   AST 8 (L) 05/21/2018   NA 141 06/06/2018   K 3.6 06/06/2018   CL 98 06/06/2018   CREATININE 9.04 (H) 06/06/2018   BUN 30 (H) 06/06/2018   CO2 24 06/06/2018   TSH 1.088 05/19/2018   INR 3.2 (H) 06/06/2018      Assessment / Plan:   Chronic Type B dissection first noted 2004, distal arch increased to 4.6 cm- continue to monitor size. Follow up ct 8 months     I  spent 60 minutes with  the patient face to face and greater then 50% of the time was spent in counseling and coordination of care.    Grace Isaac MD

## 2018-06-26 DIAGNOSIS — D509 Iron deficiency anemia, unspecified: Secondary | ICD-10-CM | POA: Diagnosis not present

## 2018-06-26 DIAGNOSIS — N186 End stage renal disease: Secondary | ICD-10-CM | POA: Diagnosis not present

## 2018-06-26 DIAGNOSIS — Z992 Dependence on renal dialysis: Secondary | ICD-10-CM | POA: Diagnosis not present

## 2018-06-26 DIAGNOSIS — N2581 Secondary hyperparathyroidism of renal origin: Secondary | ICD-10-CM | POA: Diagnosis not present

## 2018-06-28 DIAGNOSIS — I482 Chronic atrial fibrillation, unspecified: Secondary | ICD-10-CM | POA: Diagnosis not present

## 2018-06-28 DIAGNOSIS — N186 End stage renal disease: Secondary | ICD-10-CM | POA: Diagnosis not present

## 2018-06-28 DIAGNOSIS — D509 Iron deficiency anemia, unspecified: Secondary | ICD-10-CM | POA: Diagnosis not present

## 2018-06-28 DIAGNOSIS — Z992 Dependence on renal dialysis: Secondary | ICD-10-CM | POA: Diagnosis not present

## 2018-06-28 DIAGNOSIS — N2581 Secondary hyperparathyroidism of renal origin: Secondary | ICD-10-CM | POA: Diagnosis not present

## 2018-07-03 DIAGNOSIS — D509 Iron deficiency anemia, unspecified: Secondary | ICD-10-CM | POA: Diagnosis not present

## 2018-07-03 DIAGNOSIS — D689 Coagulation defect, unspecified: Secondary | ICD-10-CM | POA: Diagnosis not present

## 2018-07-03 DIAGNOSIS — N186 End stage renal disease: Secondary | ICD-10-CM | POA: Diagnosis not present

## 2018-07-03 DIAGNOSIS — N2581 Secondary hyperparathyroidism of renal origin: Secondary | ICD-10-CM | POA: Diagnosis not present

## 2018-07-03 DIAGNOSIS — Z992 Dependence on renal dialysis: Secondary | ICD-10-CM | POA: Diagnosis not present

## 2018-07-04 DIAGNOSIS — N186 End stage renal disease: Secondary | ICD-10-CM | POA: Diagnosis not present

## 2018-07-04 DIAGNOSIS — I120 Hypertensive chronic kidney disease with stage 5 chronic kidney disease or end stage renal disease: Secondary | ICD-10-CM | POA: Diagnosis not present

## 2018-07-04 DIAGNOSIS — Z992 Dependence on renal dialysis: Secondary | ICD-10-CM | POA: Diagnosis not present

## 2018-07-07 DIAGNOSIS — N2581 Secondary hyperparathyroidism of renal origin: Secondary | ICD-10-CM | POA: Diagnosis not present

## 2018-07-07 DIAGNOSIS — D631 Anemia in chronic kidney disease: Secondary | ICD-10-CM | POA: Diagnosis not present

## 2018-07-07 DIAGNOSIS — N186 End stage renal disease: Secondary | ICD-10-CM | POA: Diagnosis not present

## 2018-07-07 DIAGNOSIS — D689 Coagulation defect, unspecified: Secondary | ICD-10-CM | POA: Diagnosis not present

## 2018-07-07 DIAGNOSIS — D509 Iron deficiency anemia, unspecified: Secondary | ICD-10-CM | POA: Diagnosis not present

## 2018-07-07 DIAGNOSIS — Z992 Dependence on renal dialysis: Secondary | ICD-10-CM | POA: Diagnosis not present

## 2018-07-12 ENCOUNTER — Telehealth: Payer: Self-pay | Admitting: Pharmacist

## 2018-07-12 DIAGNOSIS — N186 End stage renal disease: Secondary | ICD-10-CM | POA: Diagnosis not present

## 2018-07-12 DIAGNOSIS — Z992 Dependence on renal dialysis: Secondary | ICD-10-CM | POA: Diagnosis not present

## 2018-07-12 DIAGNOSIS — N2581 Secondary hyperparathyroidism of renal origin: Secondary | ICD-10-CM | POA: Diagnosis not present

## 2018-07-12 DIAGNOSIS — D509 Iron deficiency anemia, unspecified: Secondary | ICD-10-CM | POA: Diagnosis not present

## 2018-07-12 DIAGNOSIS — D631 Anemia in chronic kidney disease: Secondary | ICD-10-CM | POA: Diagnosis not present

## 2018-07-12 NOTE — Telephone Encounter (Signed)
Received INR of 7.12 from dialysis drawn on 4/4 but not faxed until 4/9. LMOM for pt as he is 2 months overdue for an INR check. Will need to schedule an appt in clinic as we cannot dose an INR that is 33 days old.

## 2018-07-14 DIAGNOSIS — N186 End stage renal disease: Secondary | ICD-10-CM | POA: Diagnosis not present

## 2018-07-14 DIAGNOSIS — N2581 Secondary hyperparathyroidism of renal origin: Secondary | ICD-10-CM | POA: Diagnosis not present

## 2018-07-14 DIAGNOSIS — D509 Iron deficiency anemia, unspecified: Secondary | ICD-10-CM | POA: Diagnosis not present

## 2018-07-14 DIAGNOSIS — Z992 Dependence on renal dialysis: Secondary | ICD-10-CM | POA: Diagnosis not present

## 2018-07-14 DIAGNOSIS — D631 Anemia in chronic kidney disease: Secondary | ICD-10-CM | POA: Diagnosis not present

## 2018-07-19 DIAGNOSIS — N2581 Secondary hyperparathyroidism of renal origin: Secondary | ICD-10-CM | POA: Diagnosis not present

## 2018-07-19 DIAGNOSIS — D631 Anemia in chronic kidney disease: Secondary | ICD-10-CM | POA: Diagnosis not present

## 2018-07-19 DIAGNOSIS — D509 Iron deficiency anemia, unspecified: Secondary | ICD-10-CM | POA: Diagnosis not present

## 2018-07-19 DIAGNOSIS — Z992 Dependence on renal dialysis: Secondary | ICD-10-CM | POA: Diagnosis not present

## 2018-07-19 DIAGNOSIS — N186 End stage renal disease: Secondary | ICD-10-CM | POA: Diagnosis not present

## 2018-07-24 DIAGNOSIS — D509 Iron deficiency anemia, unspecified: Secondary | ICD-10-CM | POA: Diagnosis not present

## 2018-07-24 DIAGNOSIS — N186 End stage renal disease: Secondary | ICD-10-CM | POA: Diagnosis not present

## 2018-07-24 DIAGNOSIS — D631 Anemia in chronic kidney disease: Secondary | ICD-10-CM | POA: Diagnosis not present

## 2018-07-24 DIAGNOSIS — N2581 Secondary hyperparathyroidism of renal origin: Secondary | ICD-10-CM | POA: Diagnosis not present

## 2018-07-24 DIAGNOSIS — Z992 Dependence on renal dialysis: Secondary | ICD-10-CM | POA: Diagnosis not present

## 2018-07-28 DIAGNOSIS — D509 Iron deficiency anemia, unspecified: Secondary | ICD-10-CM | POA: Diagnosis not present

## 2018-07-28 DIAGNOSIS — N186 End stage renal disease: Secondary | ICD-10-CM | POA: Diagnosis not present

## 2018-07-28 DIAGNOSIS — Z992 Dependence on renal dialysis: Secondary | ICD-10-CM | POA: Diagnosis not present

## 2018-07-28 DIAGNOSIS — I482 Chronic atrial fibrillation, unspecified: Secondary | ICD-10-CM | POA: Diagnosis not present

## 2018-07-28 DIAGNOSIS — N2581 Secondary hyperparathyroidism of renal origin: Secondary | ICD-10-CM | POA: Diagnosis not present

## 2018-07-28 DIAGNOSIS — D631 Anemia in chronic kidney disease: Secondary | ICD-10-CM | POA: Diagnosis not present

## 2018-08-02 DIAGNOSIS — D509 Iron deficiency anemia, unspecified: Secondary | ICD-10-CM | POA: Diagnosis not present

## 2018-08-02 DIAGNOSIS — D631 Anemia in chronic kidney disease: Secondary | ICD-10-CM | POA: Diagnosis not present

## 2018-08-02 DIAGNOSIS — N2581 Secondary hyperparathyroidism of renal origin: Secondary | ICD-10-CM | POA: Diagnosis not present

## 2018-08-02 DIAGNOSIS — Z992 Dependence on renal dialysis: Secondary | ICD-10-CM | POA: Diagnosis not present

## 2018-08-02 DIAGNOSIS — N186 End stage renal disease: Secondary | ICD-10-CM | POA: Diagnosis not present

## 2018-08-03 ENCOUNTER — Telehealth: Payer: Self-pay | Admitting: *Deleted

## 2018-08-03 NOTE — Telephone Encounter (Signed)

## 2018-08-07 DIAGNOSIS — D509 Iron deficiency anemia, unspecified: Secondary | ICD-10-CM | POA: Diagnosis not present

## 2018-08-07 DIAGNOSIS — Z992 Dependence on renal dialysis: Secondary | ICD-10-CM | POA: Diagnosis not present

## 2018-08-07 DIAGNOSIS — N186 End stage renal disease: Secondary | ICD-10-CM | POA: Diagnosis not present

## 2018-08-07 DIAGNOSIS — N2581 Secondary hyperparathyroidism of renal origin: Secondary | ICD-10-CM | POA: Diagnosis not present

## 2018-08-11 DIAGNOSIS — Z992 Dependence on renal dialysis: Secondary | ICD-10-CM | POA: Diagnosis not present

## 2018-08-11 DIAGNOSIS — N2581 Secondary hyperparathyroidism of renal origin: Secondary | ICD-10-CM | POA: Diagnosis not present

## 2018-08-11 DIAGNOSIS — N186 End stage renal disease: Secondary | ICD-10-CM | POA: Diagnosis not present

## 2018-08-11 DIAGNOSIS — D509 Iron deficiency anemia, unspecified: Secondary | ICD-10-CM | POA: Diagnosis not present

## 2018-08-16 DIAGNOSIS — Z992 Dependence on renal dialysis: Secondary | ICD-10-CM | POA: Diagnosis not present

## 2018-08-16 DIAGNOSIS — N186 End stage renal disease: Secondary | ICD-10-CM | POA: Diagnosis not present

## 2018-08-16 DIAGNOSIS — N2581 Secondary hyperparathyroidism of renal origin: Secondary | ICD-10-CM | POA: Diagnosis not present

## 2018-08-16 DIAGNOSIS — D509 Iron deficiency anemia, unspecified: Secondary | ICD-10-CM | POA: Diagnosis not present

## 2018-08-21 DIAGNOSIS — N186 End stage renal disease: Secondary | ICD-10-CM | POA: Diagnosis not present

## 2018-08-21 DIAGNOSIS — D509 Iron deficiency anemia, unspecified: Secondary | ICD-10-CM | POA: Diagnosis not present

## 2018-08-21 DIAGNOSIS — Z992 Dependence on renal dialysis: Secondary | ICD-10-CM | POA: Diagnosis not present

## 2018-08-21 DIAGNOSIS — N2581 Secondary hyperparathyroidism of renal origin: Secondary | ICD-10-CM | POA: Diagnosis not present

## 2018-08-25 DIAGNOSIS — Z992 Dependence on renal dialysis: Secondary | ICD-10-CM | POA: Diagnosis not present

## 2018-08-25 DIAGNOSIS — D509 Iron deficiency anemia, unspecified: Secondary | ICD-10-CM | POA: Diagnosis not present

## 2018-08-25 DIAGNOSIS — N186 End stage renal disease: Secondary | ICD-10-CM | POA: Diagnosis not present

## 2018-08-25 DIAGNOSIS — N2581 Secondary hyperparathyroidism of renal origin: Secondary | ICD-10-CM | POA: Diagnosis not present

## 2018-08-28 DIAGNOSIS — N186 End stage renal disease: Secondary | ICD-10-CM | POA: Diagnosis not present

## 2018-08-28 DIAGNOSIS — N2581 Secondary hyperparathyroidism of renal origin: Secondary | ICD-10-CM | POA: Diagnosis not present

## 2018-08-28 DIAGNOSIS — Z992 Dependence on renal dialysis: Secondary | ICD-10-CM | POA: Diagnosis not present

## 2018-08-28 DIAGNOSIS — D509 Iron deficiency anemia, unspecified: Secondary | ICD-10-CM | POA: Diagnosis not present

## 2018-08-29 ENCOUNTER — Telehealth: Payer: Self-pay | Admitting: Pharmacist

## 2018-08-29 NOTE — Telephone Encounter (Signed)
No show to coumadin appt on 08/06/2018. Overdue for INR check.  LMOM; patient to call back and schedule f/u appointment ASAP.

## 2018-09-01 DIAGNOSIS — D509 Iron deficiency anemia, unspecified: Secondary | ICD-10-CM | POA: Diagnosis not present

## 2018-09-01 DIAGNOSIS — N2581 Secondary hyperparathyroidism of renal origin: Secondary | ICD-10-CM | POA: Diagnosis not present

## 2018-09-01 DIAGNOSIS — Z992 Dependence on renal dialysis: Secondary | ICD-10-CM | POA: Diagnosis not present

## 2018-09-01 DIAGNOSIS — N186 End stage renal disease: Secondary | ICD-10-CM | POA: Diagnosis not present

## 2018-09-01 DIAGNOSIS — I482 Chronic atrial fibrillation, unspecified: Secondary | ICD-10-CM | POA: Diagnosis not present

## 2018-09-03 DIAGNOSIS — Z992 Dependence on renal dialysis: Secondary | ICD-10-CM | POA: Diagnosis not present

## 2018-09-03 DIAGNOSIS — I129 Hypertensive chronic kidney disease with stage 1 through stage 4 chronic kidney disease, or unspecified chronic kidney disease: Secondary | ICD-10-CM | POA: Diagnosis not present

## 2018-09-03 DIAGNOSIS — N186 End stage renal disease: Secondary | ICD-10-CM | POA: Diagnosis not present

## 2018-09-04 DIAGNOSIS — N186 End stage renal disease: Secondary | ICD-10-CM | POA: Diagnosis not present

## 2018-09-04 DIAGNOSIS — Z992 Dependence on renal dialysis: Secondary | ICD-10-CM | POA: Diagnosis not present

## 2018-09-04 DIAGNOSIS — D631 Anemia in chronic kidney disease: Secondary | ICD-10-CM | POA: Diagnosis not present

## 2018-09-04 DIAGNOSIS — N2581 Secondary hyperparathyroidism of renal origin: Secondary | ICD-10-CM | POA: Diagnosis not present

## 2018-09-07 NOTE — Telephone Encounter (Signed)
LMOM to schedule Coumadin appt again.

## 2018-09-08 DIAGNOSIS — N2581 Secondary hyperparathyroidism of renal origin: Secondary | ICD-10-CM | POA: Diagnosis not present

## 2018-09-08 DIAGNOSIS — D631 Anemia in chronic kidney disease: Secondary | ICD-10-CM | POA: Diagnosis not present

## 2018-09-08 DIAGNOSIS — N186 End stage renal disease: Secondary | ICD-10-CM | POA: Diagnosis not present

## 2018-09-08 DIAGNOSIS — Z992 Dependence on renal dialysis: Secondary | ICD-10-CM | POA: Diagnosis not present

## 2018-09-13 DIAGNOSIS — N186 End stage renal disease: Secondary | ICD-10-CM | POA: Diagnosis not present

## 2018-09-13 DIAGNOSIS — Z992 Dependence on renal dialysis: Secondary | ICD-10-CM | POA: Diagnosis not present

## 2018-09-13 DIAGNOSIS — N2581 Secondary hyperparathyroidism of renal origin: Secondary | ICD-10-CM | POA: Diagnosis not present

## 2018-09-13 DIAGNOSIS — D631 Anemia in chronic kidney disease: Secondary | ICD-10-CM | POA: Diagnosis not present

## 2018-09-18 DIAGNOSIS — N2581 Secondary hyperparathyroidism of renal origin: Secondary | ICD-10-CM | POA: Diagnosis not present

## 2018-09-18 DIAGNOSIS — N186 End stage renal disease: Secondary | ICD-10-CM | POA: Diagnosis not present

## 2018-09-18 DIAGNOSIS — D631 Anemia in chronic kidney disease: Secondary | ICD-10-CM | POA: Diagnosis not present

## 2018-09-18 DIAGNOSIS — Z992 Dependence on renal dialysis: Secondary | ICD-10-CM | POA: Diagnosis not present

## 2018-09-20 ENCOUNTER — Telehealth: Payer: Self-pay | Admitting: *Deleted

## 2018-09-20 NOTE — Telephone Encounter (Signed)
REFERRAL SENT TO SCHEDULING AND NOTES ON FILE FROM DR. Ar[place] Jansky [place] [phone].

## 2018-09-22 DIAGNOSIS — N186 End stage renal disease: Secondary | ICD-10-CM | POA: Diagnosis not present

## 2018-09-22 DIAGNOSIS — N2581 Secondary hyperparathyroidism of renal origin: Secondary | ICD-10-CM | POA: Diagnosis not present

## 2018-09-22 DIAGNOSIS — Z992 Dependence on renal dialysis: Secondary | ICD-10-CM | POA: Diagnosis not present

## 2018-09-22 DIAGNOSIS — D631 Anemia in chronic kidney disease: Secondary | ICD-10-CM | POA: Diagnosis not present

## 2018-09-27 DIAGNOSIS — Z992 Dependence on renal dialysis: Secondary | ICD-10-CM | POA: Diagnosis not present

## 2018-09-27 DIAGNOSIS — D631 Anemia in chronic kidney disease: Secondary | ICD-10-CM | POA: Diagnosis not present

## 2018-09-27 DIAGNOSIS — N2581 Secondary hyperparathyroidism of renal origin: Secondary | ICD-10-CM | POA: Diagnosis not present

## 2018-09-27 DIAGNOSIS — N186 End stage renal disease: Secondary | ICD-10-CM | POA: Diagnosis not present

## 2018-09-27 DIAGNOSIS — I482 Chronic atrial fibrillation, unspecified: Secondary | ICD-10-CM | POA: Diagnosis not present

## 2018-09-29 DIAGNOSIS — Z992 Dependence on renal dialysis: Secondary | ICD-10-CM | POA: Diagnosis not present

## 2018-09-29 DIAGNOSIS — N186 End stage renal disease: Secondary | ICD-10-CM | POA: Diagnosis not present

## 2018-09-29 DIAGNOSIS — N2581 Secondary hyperparathyroidism of renal origin: Secondary | ICD-10-CM | POA: Diagnosis not present

## 2018-09-29 DIAGNOSIS — D631 Anemia in chronic kidney disease: Secondary | ICD-10-CM | POA: Diagnosis not present

## 2018-10-03 DIAGNOSIS — N186 End stage renal disease: Secondary | ICD-10-CM | POA: Diagnosis not present

## 2018-10-03 DIAGNOSIS — I129 Hypertensive chronic kidney disease with stage 1 through stage 4 chronic kidney disease, or unspecified chronic kidney disease: Secondary | ICD-10-CM | POA: Diagnosis not present

## 2018-10-03 DIAGNOSIS — Z992 Dependence on renal dialysis: Secondary | ICD-10-CM | POA: Diagnosis not present

## 2018-10-04 DIAGNOSIS — Z992 Dependence on renal dialysis: Secondary | ICD-10-CM | POA: Diagnosis not present

## 2018-10-04 DIAGNOSIS — D631 Anemia in chronic kidney disease: Secondary | ICD-10-CM | POA: Diagnosis not present

## 2018-10-04 DIAGNOSIS — N186 End stage renal disease: Secondary | ICD-10-CM | POA: Diagnosis not present

## 2018-10-04 DIAGNOSIS — N2581 Secondary hyperparathyroidism of renal origin: Secondary | ICD-10-CM | POA: Diagnosis not present

## 2018-10-04 DIAGNOSIS — D509 Iron deficiency anemia, unspecified: Secondary | ICD-10-CM | POA: Diagnosis not present

## 2018-10-06 DIAGNOSIS — N186 End stage renal disease: Secondary | ICD-10-CM | POA: Diagnosis not present

## 2018-10-06 DIAGNOSIS — Z992 Dependence on renal dialysis: Secondary | ICD-10-CM | POA: Diagnosis not present

## 2018-10-06 DIAGNOSIS — D509 Iron deficiency anemia, unspecified: Secondary | ICD-10-CM | POA: Diagnosis not present

## 2018-10-06 DIAGNOSIS — D631 Anemia in chronic kidney disease: Secondary | ICD-10-CM | POA: Diagnosis not present

## 2018-10-06 DIAGNOSIS — N2581 Secondary hyperparathyroidism of renal origin: Secondary | ICD-10-CM | POA: Diagnosis not present

## 2018-10-09 DIAGNOSIS — Z992 Dependence on renal dialysis: Secondary | ICD-10-CM | POA: Diagnosis not present

## 2018-10-09 DIAGNOSIS — D631 Anemia in chronic kidney disease: Secondary | ICD-10-CM | POA: Diagnosis not present

## 2018-10-09 DIAGNOSIS — D509 Iron deficiency anemia, unspecified: Secondary | ICD-10-CM | POA: Diagnosis not present

## 2018-10-09 DIAGNOSIS — N186 End stage renal disease: Secondary | ICD-10-CM | POA: Diagnosis not present

## 2018-10-09 DIAGNOSIS — N2581 Secondary hyperparathyroidism of renal origin: Secondary | ICD-10-CM | POA: Diagnosis not present

## 2018-10-13 DIAGNOSIS — Z992 Dependence on renal dialysis: Secondary | ICD-10-CM | POA: Diagnosis not present

## 2018-10-13 DIAGNOSIS — D631 Anemia in chronic kidney disease: Secondary | ICD-10-CM | POA: Diagnosis not present

## 2018-10-13 DIAGNOSIS — N186 End stage renal disease: Secondary | ICD-10-CM | POA: Diagnosis not present

## 2018-10-13 DIAGNOSIS — N2581 Secondary hyperparathyroidism of renal origin: Secondary | ICD-10-CM | POA: Diagnosis not present

## 2018-10-13 DIAGNOSIS — D509 Iron deficiency anemia, unspecified: Secondary | ICD-10-CM | POA: Diagnosis not present

## 2018-10-18 DIAGNOSIS — D509 Iron deficiency anemia, unspecified: Secondary | ICD-10-CM | POA: Diagnosis not present

## 2018-10-18 DIAGNOSIS — Z992 Dependence on renal dialysis: Secondary | ICD-10-CM | POA: Diagnosis not present

## 2018-10-18 DIAGNOSIS — N2581 Secondary hyperparathyroidism of renal origin: Secondary | ICD-10-CM | POA: Diagnosis not present

## 2018-10-18 DIAGNOSIS — D631 Anemia in chronic kidney disease: Secondary | ICD-10-CM | POA: Diagnosis not present

## 2018-10-18 DIAGNOSIS — N186 End stage renal disease: Secondary | ICD-10-CM | POA: Diagnosis not present

## 2018-10-20 DIAGNOSIS — N2581 Secondary hyperparathyroidism of renal origin: Secondary | ICD-10-CM | POA: Diagnosis not present

## 2018-10-20 DIAGNOSIS — D509 Iron deficiency anemia, unspecified: Secondary | ICD-10-CM | POA: Diagnosis not present

## 2018-10-20 DIAGNOSIS — Z992 Dependence on renal dialysis: Secondary | ICD-10-CM | POA: Diagnosis not present

## 2018-10-20 DIAGNOSIS — D631 Anemia in chronic kidney disease: Secondary | ICD-10-CM | POA: Diagnosis not present

## 2018-10-20 DIAGNOSIS — N186 End stage renal disease: Secondary | ICD-10-CM | POA: Diagnosis not present

## 2018-10-25 DIAGNOSIS — I482 Chronic atrial fibrillation, unspecified: Secondary | ICD-10-CM | POA: Diagnosis not present

## 2018-10-25 DIAGNOSIS — D509 Iron deficiency anemia, unspecified: Secondary | ICD-10-CM | POA: Diagnosis not present

## 2018-10-25 DIAGNOSIS — N2581 Secondary hyperparathyroidism of renal origin: Secondary | ICD-10-CM | POA: Diagnosis not present

## 2018-10-25 DIAGNOSIS — N186 End stage renal disease: Secondary | ICD-10-CM | POA: Diagnosis not present

## 2018-10-25 DIAGNOSIS — Z992 Dependence on renal dialysis: Secondary | ICD-10-CM | POA: Diagnosis not present

## 2018-10-25 DIAGNOSIS — D631 Anemia in chronic kidney disease: Secondary | ICD-10-CM | POA: Diagnosis not present

## 2018-10-27 DIAGNOSIS — D509 Iron deficiency anemia, unspecified: Secondary | ICD-10-CM | POA: Diagnosis not present

## 2018-10-27 DIAGNOSIS — Z992 Dependence on renal dialysis: Secondary | ICD-10-CM | POA: Diagnosis not present

## 2018-10-27 DIAGNOSIS — D631 Anemia in chronic kidney disease: Secondary | ICD-10-CM | POA: Diagnosis not present

## 2018-10-27 DIAGNOSIS — N2581 Secondary hyperparathyroidism of renal origin: Secondary | ICD-10-CM | POA: Diagnosis not present

## 2018-10-27 DIAGNOSIS — N186 End stage renal disease: Secondary | ICD-10-CM | POA: Diagnosis not present

## 2018-10-30 ENCOUNTER — Telehealth: Payer: Self-pay | Admitting: Pharmacist

## 2018-10-30 NOTE — Telephone Encounter (Signed)

## 2018-10-31 ENCOUNTER — Ambulatory Visit (INDEPENDENT_AMBULATORY_CARE_PROVIDER_SITE_OTHER): Payer: Medicare Other | Admitting: *Deleted

## 2018-10-31 ENCOUNTER — Other Ambulatory Visit: Payer: Self-pay

## 2018-10-31 DIAGNOSIS — I4891 Unspecified atrial fibrillation: Secondary | ICD-10-CM | POA: Diagnosis not present

## 2018-10-31 DIAGNOSIS — Z7901 Long term (current) use of anticoagulants: Secondary | ICD-10-CM

## 2018-10-31 LAB — POCT INR: INR: 1.7 — AB (ref 2.0–3.0)

## 2018-10-31 MED ORDER — WARFARIN SODIUM 5 MG PO TABS: ORAL_TABLET | ORAL | 0 refills | Status: DC

## 2018-10-31 NOTE — Patient Instructions (Signed)
Description   Take 2 tablets today then continue taking 1.5 tablets daily except 1 tablet on Mondays and Fridays. Recheck INR in 3 weeks. Continue eating dark green leafy veggies 2-3 times a week.

## 2018-11-01 ENCOUNTER — Telehealth: Payer: Self-pay | Admitting: Cardiology

## 2018-11-01 DIAGNOSIS — Z992 Dependence on renal dialysis: Secondary | ICD-10-CM | POA: Diagnosis not present

## 2018-11-01 DIAGNOSIS — D509 Iron deficiency anemia, unspecified: Secondary | ICD-10-CM | POA: Diagnosis not present

## 2018-11-01 DIAGNOSIS — N2581 Secondary hyperparathyroidism of renal origin: Secondary | ICD-10-CM | POA: Diagnosis not present

## 2018-11-01 DIAGNOSIS — D631 Anemia in chronic kidney disease: Secondary | ICD-10-CM | POA: Diagnosis not present

## 2018-11-01 DIAGNOSIS — N186 End stage renal disease: Secondary | ICD-10-CM | POA: Diagnosis not present

## 2018-11-01 NOTE — Telephone Encounter (Signed)
New message   Pt c/o medication issue:  1. Name of Medication: warfarin (COUMADIN) 5 MG tablet   amiodarone (PACERONE) 200 MG tablet  2. How are you currently taking this medication (dosage and times per day)? n/a  3. Are you having a reaction (difficulty breathing--STAT)? n/a  4. What is your medication issue? Per [facility] states that there could be a drug interaction. Please call to discuss.

## 2018-11-01 NOTE — Telephone Encounter (Signed)
Fine to take both - we're prescribing both meds and monitoring INR. Pharmacy is aware to fill.

## 2018-11-03 DIAGNOSIS — I129 Hypertensive chronic kidney disease with stage 1 through stage 4 chronic kidney disease, or unspecified chronic kidney disease: Secondary | ICD-10-CM | POA: Diagnosis not present

## 2018-11-03 DIAGNOSIS — N186 End stage renal disease: Secondary | ICD-10-CM | POA: Diagnosis not present

## 2018-11-03 DIAGNOSIS — D631 Anemia in chronic kidney disease: Secondary | ICD-10-CM | POA: Diagnosis not present

## 2018-11-03 DIAGNOSIS — N2581 Secondary hyperparathyroidism of renal origin: Secondary | ICD-10-CM | POA: Diagnosis not present

## 2018-11-03 DIAGNOSIS — Z992 Dependence on renal dialysis: Secondary | ICD-10-CM | POA: Diagnosis not present

## 2018-11-03 DIAGNOSIS — D509 Iron deficiency anemia, unspecified: Secondary | ICD-10-CM | POA: Diagnosis not present

## 2018-11-08 DIAGNOSIS — Z992 Dependence on renal dialysis: Secondary | ICD-10-CM | POA: Diagnosis not present

## 2018-11-08 DIAGNOSIS — N2581 Secondary hyperparathyroidism of renal origin: Secondary | ICD-10-CM | POA: Diagnosis not present

## 2018-11-08 DIAGNOSIS — D631 Anemia in chronic kidney disease: Secondary | ICD-10-CM | POA: Diagnosis not present

## 2018-11-08 DIAGNOSIS — D509 Iron deficiency anemia, unspecified: Secondary | ICD-10-CM | POA: Diagnosis not present

## 2018-11-08 DIAGNOSIS — N186 End stage renal disease: Secondary | ICD-10-CM | POA: Diagnosis not present

## 2018-11-08 DIAGNOSIS — I482 Chronic atrial fibrillation, unspecified: Secondary | ICD-10-CM | POA: Diagnosis not present

## 2018-11-10 DIAGNOSIS — D631 Anemia in chronic kidney disease: Secondary | ICD-10-CM | POA: Diagnosis not present

## 2018-11-10 DIAGNOSIS — Z992 Dependence on renal dialysis: Secondary | ICD-10-CM | POA: Diagnosis not present

## 2018-11-10 DIAGNOSIS — D509 Iron deficiency anemia, unspecified: Secondary | ICD-10-CM | POA: Diagnosis not present

## 2018-11-10 DIAGNOSIS — N186 End stage renal disease: Secondary | ICD-10-CM | POA: Diagnosis not present

## 2018-11-10 DIAGNOSIS — N2581 Secondary hyperparathyroidism of renal origin: Secondary | ICD-10-CM | POA: Diagnosis not present

## 2018-11-16 ENCOUNTER — Other Ambulatory Visit: Payer: Self-pay

## 2018-11-16 ENCOUNTER — Telehealth (HOSPITAL_COMMUNITY): Payer: Self-pay | Admitting: *Deleted

## 2018-11-16 ENCOUNTER — Telehealth: Payer: Self-pay | Admitting: Cardiology

## 2018-11-16 MED ORDER — METOPROLOL SUCCINATE ER 25 MG PO TB24: 25.0000 mg | ORAL_TABLET | Freq: Two times a day (BID) | ORAL | 3 refills | Status: DC

## 2018-11-16 NOTE — Telephone Encounter (Signed)
New Message   STAT if HR is under 50 or over 120 (normal HR is 60-100 beats per minute)  1) What is your heart rate? 115  2) Do you have a log of your heart rate readings (document readings)? For the past two weeks it has been ranging between 115-120 each time he takes it.   3) Do you have any other symptoms? No other symptoms

## 2018-11-16 NOTE — Telephone Encounter (Signed)
He needs to come in for an EKG and be seen if possible.  If we can't work him in today then he should resume his Toprol 25 mg twice a day and come in Monday.  It sounds like he is out of rhythm.  Please make sure he is taking his Coumadin.  If he is uncomfortable or SOB at rest he should go to the ED.  Kerin Ransom PA-C 11/16/2018 12:24 PM

## 2018-11-16 NOTE — Telephone Encounter (Addendum)
Pt declined his appt with the AFIB clinic [facility] even though I urged him to try and go and they could see him but he says he cannot go..he agreed to restart the Toprol per [facility] PA and to monitor his BP and HR over the weekend and if his rate increases or he gets symptomatic such as SOB, dizziness, palpitations.[name] to please have someone take him to the ER... pt agrees to be seen in the AFIB clinic Monday 11/19/18 at 10:30 and given his instructions for his appt there.         COVID-19 Pre-Screening Questions:  . In the past 7 to 10 days have you had a cough,  shortness of breath, headache, congestion, fever (100 or greater) body aches, chills, sore throat, or sudden loss of taste or sense of smell? NO . Have you been around anyone with known Covid 19. NO . Have you been around anyone who is awaiting Covid 19 test results in the past 7 to 10 days? NO . Have you been around anyone who has been exposed to Covid 19, or has mentioned symptoms of Covid 19 within the past 7 to 10 days? NO  If you have any concerns/questions about symptoms patients report during screening (either on the phone or at threshold). Contact the provider seeing the patient or DOD for further guidance.  If neither are available contact a member of the leadership team.

## 2018-11-16 NOTE — Telephone Encounter (Signed)
Pt called to report that over the past 2 weeks his HR on his BP cuff has been staying between 100 and 115... he denies palpitations, no dizziness or SOB, but says he feels a little "off".... he says that he has been off of his Metoprolol 25mg  bid for a long time... but not sure when and who stopped it but it was because he had low BP... today his BP is 111/78 and HR 110. He is still on his Amiodarone 200 mg qd.   Will forward to the MD/APP for review.

## 2018-11-16 NOTE — Telephone Encounter (Signed)
I cld pt to offer an appt this afternoon since he was reporting out of rhythm with faster HRs x 2 weeks.  Pt declined appt today and an appt was made for Monday.  Anne at [facility] was going to call him to advise on beta blocker.

## 2018-11-17 DIAGNOSIS — N186 End stage renal disease: Secondary | ICD-10-CM | POA: Diagnosis not present

## 2018-11-17 DIAGNOSIS — Z992 Dependence on renal dialysis: Secondary | ICD-10-CM | POA: Diagnosis not present

## 2018-11-17 DIAGNOSIS — D631 Anemia in chronic kidney disease: Secondary | ICD-10-CM | POA: Diagnosis not present

## 2018-11-17 DIAGNOSIS — D509 Iron deficiency anemia, unspecified: Secondary | ICD-10-CM | POA: Diagnosis not present

## 2018-11-17 DIAGNOSIS — N2581 Secondary hyperparathyroidism of renal origin: Secondary | ICD-10-CM | POA: Diagnosis not present

## 2018-11-19 ENCOUNTER — Ambulatory Visit (HOSPITAL_COMMUNITY)
Admission: RE | Admit: 2018-11-19 | Discharge: 2018-11-19 | Disposition: A | Discharge status: Home / Self Care | Payer: Medicare Other | Source: Ambulatory Visit | Attending: Physician Assistant | Admitting: Physician Assistant

## 2018-11-19 ENCOUNTER — Encounter (HOSPITAL_COMMUNITY): Payer: Self-pay | Admitting: Physician Assistant

## 2018-11-19 ENCOUNTER — Other Ambulatory Visit: Payer: Self-pay

## 2018-11-19 VITALS — BP 122/86 | HR 104 | Ht 74.0 in | Wt 176.4 lb

## 2018-11-19 DIAGNOSIS — I4892 Unspecified atrial flutter: Secondary | ICD-10-CM | POA: Diagnosis not present

## 2018-11-19 DIAGNOSIS — Z881 Allergy status to other antibiotic agents status: Secondary | ICD-10-CM | POA: Diagnosis not present

## 2018-11-19 DIAGNOSIS — N186 End stage renal disease: Secondary | ICD-10-CM | POA: Diagnosis not present

## 2018-11-19 DIAGNOSIS — Z888 Allergy status to other drugs, medicaments and biological substances status: Secondary | ICD-10-CM | POA: Insufficient documentation

## 2018-11-19 DIAGNOSIS — Z7901 Long term (current) use of anticoagulants: Secondary | ICD-10-CM | POA: Diagnosis not present

## 2018-11-19 DIAGNOSIS — Z8349 Family history of other endocrine, nutritional and metabolic diseases: Secondary | ICD-10-CM | POA: Insufficient documentation

## 2018-11-19 DIAGNOSIS — Z992 Dependence on renal dialysis: Secondary | ICD-10-CM | POA: Insufficient documentation

## 2018-11-19 DIAGNOSIS — Z79899 Other long term (current) drug therapy: Secondary | ICD-10-CM | POA: Insufficient documentation

## 2018-11-19 DIAGNOSIS — I120 Hypertensive chronic kidney disease with stage 5 chronic kidney disease or end stage renal disease: Secondary | ICD-10-CM | POA: Diagnosis not present

## 2018-11-19 DIAGNOSIS — G629 Polyneuropathy, unspecified: Secondary | ICD-10-CM | POA: Diagnosis not present

## 2018-11-19 DIAGNOSIS — Z8249 Family history of ischemic heart disease and other diseases of the circulatory system: Secondary | ICD-10-CM | POA: Insufficient documentation

## 2018-11-19 DIAGNOSIS — Z823 Family history of stroke: Secondary | ICD-10-CM | POA: Diagnosis not present

## 2018-11-19 DIAGNOSIS — I4819 Other persistent atrial fibrillation: Secondary | ICD-10-CM | POA: Insufficient documentation

## 2018-11-19 DIAGNOSIS — I7100 Dissection of unspecified site of aorta: Secondary | ICD-10-CM | POA: Diagnosis not present

## 2018-11-19 DIAGNOSIS — H3553 Other dystrophies primarily involving the sensory retina: Secondary | ICD-10-CM | POA: Diagnosis not present

## 2018-11-19 MED ORDER — METOPROLOL TARTRATE 25 MG PO TABS: 12.5000 mg | ORAL_TABLET | Freq: Two times a day (BID) | ORAL | 3 refills | Status: DC

## 2018-11-19 NOTE — Patient Instructions (Signed)
Start Metoprolol 12.5mg  twice a day - hold the mornings of dialysis.  Weekly INR checks for the next 4 weeks - I will have coumadin clinic reach out to you

## 2018-11-19 NOTE — Progress Notes (Signed)
Primary Care Physician: Clent Demark, PA-C Primary Cardiologist: Dr [name] Primary Electrophysiologist: none Referring Physician: Kerin Ransom PA-C   Corey [name] is a 48 y.o. male with a history of aortic dissection s/p repeair 2004, ESRD, HTN, persistent atrial fibrillation and atrial flutter who presents for consultation in the [facility].  The patient was initially diagnosed with atrial fibrillation in 2014 in [place]. He was recently admitted after presenting to the ER with heart racing and found to be in afib with RVR. He was started on amiodarone and converted to SR. He has done well on amiodarone with much less heart racing until two weeks ago. His BP machine at home has shown heart rates 100-115 bpm. He denies chest pain, SOB, or dizziness. He does reports that around the time of his afib onset, he got news that two of his brothers were hospitalized with COVID-19 and one was in ICU on a ventilator. He denies snoring or significant alcohol use.   Today, he denies symptoms of chest pain, shortness of breath, orthopnea, PND, lower extremity edema, dizziness, presyncope, syncope, snoring, daytime somnolence, bleeding, or neurologic sequela. The patient is tolerating medications without difficulties and is otherwise without complaint today.    Atrial Fibrillation Risk Factors:  he does not have symptoms or diagnosis of sleep apnea. he does not have a history of rheumatic fever. he does not have a history of alcohol use. The patient does have a history of early familial atrial fibrillation or other arrhythmias. Brother has afib.  he has a BMI of Body mass index is 22.64 kg/m.[name] Filed Weights   11/19/18 1040  Weight: 80 kg    Family History  Problem Relation Age of Onset  . Cancer Mother   . Hypertension Mother   . Cancer Father   . Hypertension Father   . Thyroid disease Sister   . Stroke Brother        11     Atrial Fibrillation Management  history:  Previous antiarrhythmic drugs: amiodarone Previous cardioversions: 2014 Previous ablations: none CHADS2VASC score: 2 Anticoagulation history: Warfarin    Past Medical History:  Diagnosis Date  . Arthritis    hands and shoulders  . Blindness and low vision    "Stargardt disease"  . Dissection of aorta ([place]) 2004   a. s/p extensive repeair in [id number] in [place] complicated by ESRD, lower extremity paralysis, coma, and extended hospitalization of 2 years  . ESRD (end stage renal disease) ([place])    a. TTS  . Headache   . History of cardioversion 2014  . Hypertension   . Neuropathy   . Non-healing non-surgical wound 03/2016  . PAF (paroxysmal atrial fibrillation) ([place])    a. s/p DCCV in 2014; b. on Coumadin; c. CHADS2VASc => 2 (HTN, vascular disease)  . Paralysis ([place])    due to dissection of aorta in 2004, lower extremities  . Pneumonia    Past Surgical History:  Procedure Laterality Date  . APPLICATION OF WOUND VAC Left 04/13/2016   Procedure: APPLICATION OF WOUND VAC;  Surgeon: Conrad [place] East, MD;  Location: [place];  Service: Vascular;  Laterality: Left;  . APPLICATION OF WOUND VAC Left 04/18/2016   Procedure: APPLICATION OF WOUND VAC;  Surgeon: Waynetta Sandy, MD;  Location: [place];  Service: Vascular;  Laterality: Left;  Wound vac change   . APPLICATION OF WOUND VAC Left 04/20/2016   Procedure: WOUND VAC CHANGE;  Surgeon: Conrad Chanute, MD;  Location[facility];  Service: Vascular;  Laterality: Left;  . AV FISTULA PLACEMENT    . BASCILIC VEIN TRANSPOSITION Left 12/09/2015   Procedure: FIRST STAGE BASILIC VEIN TRANSPOSITION LEFT UPPER ARM;  Surgeon: Conrad Esmeralda, MD;  Location: [place];  Service: Vascular;  Laterality: Left;  . BASCILIC VEIN TRANSPOSITION Left 03/09/2016   Procedure: SECOND STAGE BASILIC VEIN TRANSPOSITION WITH REVISION OF ANASTOMOSIS LEFT UPPER ARM;  Surgeon: Conrad Carlos, MD;  Location: [facility];  Service: Vascular;  Laterality: Left;  . CARDIOVERSION    . REPAIR  OF ACUTE ASCENDING THORACIC AORTIC DISSECTION    . REVISON OF ARTERIOVENOUS FISTULA Left 04/20/2016   Procedure: LIGATION OF BASILIC VEIN TRANSPOSITION;  Surgeon: Conrad Lincolnton, MD;  Location: [place];  Service: Vascular;  Laterality: Left;  . WOUND DEBRIDEMENT Left 04/13/2016   Procedure: DEBRIDEMENT WOUND;  Surgeon: Conrad , MD;  Location: [place];  Service: Vascular;  Laterality: Left;    Current Outpatient Medications  Medication Sig Dispense Refill  . amiodarone (PACERONE) 200 MG tablet Take 1 tablet (200 mg total) by mouth daily. 90 tablet 3  . Multiple Vitamins-Minerals (MULTIVITAMIN WITH MINERALS) tablet Take 1 tablet by mouth daily.    . sevelamer carbonate (RENVELA) 0.8 g PACK packet Take 1.6 g by mouth 3 (three) times daily with meals. (Patient taking differently: Take 0.8-1.6 g by mouth See admin instructions. Take 1.6g with meals and 0.8g with snacks) 270 each 0  . warfarin (COUMADIN) 5 MG tablet Take as directed by coumadin clinic 50 tablet 0  . metoprolol tartrate (LOPRESSOR) 25 MG tablet Take 0.5 tablets (12.5 mg total) by mouth 2 (two) times daily. Hold the AM of dialysis days 30 tablet 3   No current facility-administered medications for this encounter.     Allergies  Allergen Reactions  . Ciprofloxacin Other (See Comments)    Aortic dissection  . Heparin Other (See Comments)    UNSPECIFIED REACTION :  On Coumadin since 2004   HIT panel negative 01/19/17  . Quinolones     Social History   Socioeconomic History  . Marital status: Divorced    Spouse name: Not on file  . Number of children: 2  . Years of education: Not on file  . Highest education level: Not on file  Occupational History  . Occupation: DIABLED  Social Needs  . Financial resource strain: Not on file  . Food insecurity    Worry: Not on file    Inability: Not on file  . Transportation needs    Medical: Not on file    Non-medical: Not on file  Tobacco Use  . Smoking status: Never Smoker  .  Smokeless tobacco: Never Used  Substance and Sexual Activity  . Alcohol use: No  . Drug use: No  . Sexual activity: Not on file  Lifestyle  . Physical activity    Days per week: Not on file    Minutes per session: Not on file  . Stress: Not on file  Relationships  . Social Herbalist on phone: Not on file    Gets together: Not on file    Attends religious service: Not on file    Active member of club or organization: Not on file    Attends meetings of clubs or organizations: Not on file    Relationship status: Not on file  . Intimate partner violence    Fear of current or ex partner: Not on file  Emotionally abused: Not on file    Physically abused: Not on file    Forced sexual activity: Not on file  Other Topics Concern  . Not on file  Social History Narrative  . Not on file     ROS- All systems are reviewed and negative except as per the HPI above.  Physical Exam: Vitals:   11/19/18 1040  BP: 122/86  Pulse: (!) 104  Weight: 80 kg  Height: 6\' 2"  (1.88 m)    GEN- The patient is well appearing, alert and oriented x 3 today.   Head- normocephalic, atraumatic Eyes-  Sclera clear, conjunctiva pink Ears- hearing intact Oropharynx- clear Neck- supple  Lungs- Clear to ausculation bilaterally, normal work of breathing Heart- irregular rate and rhythm, no murmurs, rubs or gallops  GI- soft, NT, ND, + BS Extremities- no clubbing, cyanosis, or edema MS- no significant deformity or atrophy Skin- no rash or lesion Psych- euthymic mood, full affect Neuro- strength and sensation are intact  Wt Readings from Last 3 Encounters:  11/19/18 80 kg  06/18/18 88.9 kg  06/06/18 88.9 kg    EKG today demonstrates afib HR 104, inc RBBB, QRS 108, QTc 560   Echo 05/20/18 demonstrated  1. The left ventricle has normal systolic function with an ejection fraction of 60-65%. The cavity size was normal. There is mildly increased left ventricular wall thickness. Left  ventricular diastology could not be evaluated due to nondiagnostic  images. No evidence of left ventricular regional wall motion abnormalities.  2. The right ventricle has normal systolic function. The cavity was normal. There is no increase in right ventricular wall thickness. Right ventricular systolic pressure is moderately elevated with an estimated pressure of 42.8 mmHg.  3. The pericardial effusion is posterior to the left ventricle.  4. Trivial pericardial effusion.  5. The mitral valve is normal in structure.  6. The tricuspid valve is normal in structure.  7. The aortic valve is tricuspid Mild sclerosis of the aortic valve.  8. The pulmonic valve was normal in structure.  9. The inferior vena cava was dilated in size with >50% respiratory variability. 10. No evidence of left ventricular regional wall motion abnormalities. 11. Right atrial pressure is estimated at 8 mmHg.   Epic records are reviewed at length today  Assessment and Plan:  1. Persistent atrial fibrillation/atrial flutter Patient appears to be in persistent afib. ? related to recent family stress. General education about afib discussed and questions answered. Will start Lopressor 12.5 mg BID for rate control. Pt will hold on HD mornings. Continue warfarin. INR subtherapeutic at 1.7 on 10/31/18. Will plan for weekly INR x4 in anticipation of possible DCCV. Continue amiodarone 200 mg daily. Will plan for DCCV if patient remains in afib on follow up.  This patients CHA2DS2-VASc Score and unadjusted Ischemic Stroke Rate (% per year) is equal to 2.2 % stroke rate/year from a score of 2  Above score calculated as 1 point each if present [CHF, HTN, DM, Vascular=MI/PAD/Aortic Plaque, Age if 65-74, or Male] Above score calculated as 2 points each if present [Age > 75, or Stroke/TIA/TE]   2. HTN Stable, med changes as above.  3. Aortic dissection S/p repair 2004. Now with stable type B dissection. Followed by Dr  [name] and Dr Servando Snare.  4. ESRD HD on T/Th/S   Follow up in the AF clinic in 3 weeks.   [facility] [address] [place], [place] [address] [phone] 11/19/2018  11:23 AM

## 2018-11-20 DIAGNOSIS — Z992 Dependence on renal dialysis: Secondary | ICD-10-CM | POA: Diagnosis not present

## 2018-11-20 DIAGNOSIS — N2581 Secondary hyperparathyroidism of renal origin: Secondary | ICD-10-CM | POA: Diagnosis not present

## 2018-11-20 DIAGNOSIS — D631 Anemia in chronic kidney disease: Secondary | ICD-10-CM | POA: Diagnosis not present

## 2018-11-20 DIAGNOSIS — D509 Iron deficiency anemia, unspecified: Secondary | ICD-10-CM | POA: Diagnosis not present

## 2018-11-20 DIAGNOSIS — N186 End stage renal disease: Secondary | ICD-10-CM | POA: Diagnosis not present

## 2018-11-23 ENCOUNTER — Other Ambulatory Visit: Payer: Self-pay

## 2018-11-23 ENCOUNTER — Ambulatory Visit (INDEPENDENT_AMBULATORY_CARE_PROVIDER_SITE_OTHER): Payer: Medicare Other | Admitting: *Deleted

## 2018-11-23 DIAGNOSIS — I4891 Unspecified atrial fibrillation: Secondary | ICD-10-CM

## 2018-11-23 DIAGNOSIS — Z7901 Long term (current) use of anticoagulants: Secondary | ICD-10-CM | POA: Diagnosis not present

## 2018-11-23 LAB — POCT INR: INR: 4.4 — AB (ref 2.0–3.0)

## 2018-11-23 NOTE — Patient Instructions (Addendum)
Description    Hold today and tomorrow, then continue taking 1.5 tablets daily except 1 tablet on Mondays and Fridays. Recheck INR in 1 week. Continue eating dark green leafy veggies 2-3 times a week.  Coumadin Clinic [phone]

## 2018-11-24 DIAGNOSIS — D631 Anemia in chronic kidney disease: Secondary | ICD-10-CM | POA: Diagnosis not present

## 2018-11-24 DIAGNOSIS — D509 Iron deficiency anemia, unspecified: Secondary | ICD-10-CM | POA: Diagnosis not present

## 2018-11-24 DIAGNOSIS — Z992 Dependence on renal dialysis: Secondary | ICD-10-CM | POA: Diagnosis not present

## 2018-11-24 DIAGNOSIS — N186 End stage renal disease: Secondary | ICD-10-CM | POA: Diagnosis not present

## 2018-11-24 DIAGNOSIS — N2581 Secondary hyperparathyroidism of renal origin: Secondary | ICD-10-CM | POA: Diagnosis not present

## 2018-11-26 DIAGNOSIS — I482 Chronic atrial fibrillation, unspecified: Secondary | ICD-10-CM | POA: Diagnosis not present

## 2018-11-26 DIAGNOSIS — N2581 Secondary hyperparathyroidism of renal origin: Secondary | ICD-10-CM | POA: Diagnosis not present

## 2018-11-26 DIAGNOSIS — Z7901 Long term (current) use of anticoagulants: Secondary | ICD-10-CM | POA: Diagnosis not present

## 2018-11-29 DIAGNOSIS — D631 Anemia in chronic kidney disease: Secondary | ICD-10-CM | POA: Diagnosis not present

## 2018-11-29 DIAGNOSIS — N2581 Secondary hyperparathyroidism of renal origin: Secondary | ICD-10-CM | POA: Diagnosis not present

## 2018-11-29 DIAGNOSIS — D509 Iron deficiency anemia, unspecified: Secondary | ICD-10-CM | POA: Diagnosis not present

## 2018-11-29 DIAGNOSIS — N186 End stage renal disease: Secondary | ICD-10-CM | POA: Diagnosis not present

## 2018-11-29 DIAGNOSIS — Z992 Dependence on renal dialysis: Secondary | ICD-10-CM | POA: Diagnosis not present

## 2018-11-30 ENCOUNTER — Telehealth: Payer: Self-pay

## 2018-11-30 NOTE — Telephone Encounter (Signed)
   [facility] Pre-operative Risk Assessment    Request for surgical clearance:  1. What type of surgery is being performed? PARATHYROIDECTOMY   2. When is this surgery scheduled? TBD   3. What type of clearance is required (medical clearance vs. Pharmacy clearance to hold med vs. Both)? Pharmacy  4. Are there any medications that need to be held prior to surgery and how long?Warfarin   5. Practice name and name of physician performing surgery? [facility] Dr Armandina Gemma   6. What is your office phone number [phone]    7.   What is your office fax number [phone]  8.   Anesthesia type (None, local, MAC, general) ? general   Frederik Schmidt 11/30/2018, 3:21 PM  _________________________________________________________________   (provider comments below)

## 2018-12-01 DIAGNOSIS — N186 End stage renal disease: Secondary | ICD-10-CM | POA: Diagnosis not present

## 2018-12-01 DIAGNOSIS — Z992 Dependence on renal dialysis: Secondary | ICD-10-CM | POA: Diagnosis not present

## 2018-12-01 DIAGNOSIS — N2581 Secondary hyperparathyroidism of renal origin: Secondary | ICD-10-CM | POA: Diagnosis not present

## 2018-12-01 DIAGNOSIS — D509 Iron deficiency anemia, unspecified: Secondary | ICD-10-CM | POA: Diagnosis not present

## 2018-12-01 DIAGNOSIS — D631 Anemia in chronic kidney disease: Secondary | ICD-10-CM | POA: Diagnosis not present

## 2018-12-02 NOTE — Telephone Encounter (Signed)
Clinical pharmacist to review coumadin 

## 2018-12-03 NOTE — Telephone Encounter (Signed)
Patient with diagnosis of afib on warfarin for anticoagulation.    Procedure: PARATHYROIDECTOMY  Date of procedure: TBD  CHADS2-VASc score of  3 (CHF, HTN, CAD)  Per office protocol, patient can hold warfarin for 5 days prior to procedure.    Patient will NOT need lovenox bridge.

## 2018-12-03 NOTE — Telephone Encounter (Signed)
Called Dr. Tera Helper office who states that pt is in end stage renal disease and they would like to get it under control before it gets worst. The triage nurse tried to get in contact with Dr. Tera Helper who was onsite at the office but was unavailable at the time and would give our office a call when she could get more information.

## 2018-12-03 NOTE — Telephone Encounter (Signed)
  Chart reviewed. He was recently seen in the [facility] for Afib w/ RVR. Metoprolol was added and plan is to f/u on 9/11 and to set up for possible DCCV if still in afib.   CV Callback, please contact requesting provider's office to find out the urgency of procedure. Surgical date listed as TBD. We may need to postpone surgery until we can get his afib under better control, if able.

## 2018-12-06 ENCOUNTER — Encounter: Payer: Self-pay | Admitting: Cardiology

## 2018-12-06 DIAGNOSIS — D509 Iron deficiency anemia, unspecified: Secondary | ICD-10-CM | POA: Diagnosis not present

## 2018-12-06 DIAGNOSIS — N186 End stage renal disease: Secondary | ICD-10-CM | POA: Diagnosis not present

## 2018-12-06 DIAGNOSIS — N2581 Secondary hyperparathyroidism of renal origin: Secondary | ICD-10-CM | POA: Diagnosis not present

## 2018-12-06 DIAGNOSIS — Z992 Dependence on renal dialysis: Secondary | ICD-10-CM | POA: Diagnosis not present

## 2018-12-06 DIAGNOSIS — D631 Anemia in chronic kidney disease: Secondary | ICD-10-CM | POA: Diagnosis not present

## 2018-12-06 DIAGNOSIS — I482 Chronic atrial fibrillation, unspecified: Secondary | ICD-10-CM | POA: Diagnosis not present

## 2018-12-06 DIAGNOSIS — E876 Hypokalemia: Secondary | ICD-10-CM | POA: Diagnosis not present

## 2018-12-06 LAB — POCT INR

## 2018-12-06 NOTE — Telephone Encounter (Signed)
Dr. Tera Helper office called and state that per Dr. Tera Helper, the patient's surgery is not urgent. He would like for patient's afib to be dealt with first and then once pt is clear for surgery, send clearance note.   I will route this to Clint Fenton for appt on 9/11 and keep in preop box to follow up on clearance.

## 2018-12-07 ENCOUNTER — Other Ambulatory Visit: Payer: Self-pay

## 2018-12-07 ENCOUNTER — Ambulatory Visit (INDEPENDENT_AMBULATORY_CARE_PROVIDER_SITE_OTHER): Payer: Medicare Other | Admitting: *Deleted

## 2018-12-07 DIAGNOSIS — Z7901 Long term (current) use of anticoagulants: Secondary | ICD-10-CM

## 2018-12-07 DIAGNOSIS — I4891 Unspecified atrial fibrillation: Secondary | ICD-10-CM | POA: Diagnosis not present

## 2018-12-07 LAB — POCT INR: INR: 2.9 (ref 2.0–3.0)

## 2018-12-07 NOTE — Patient Instructions (Signed)
Description   Continue taking 1.5 tablets daily except 1 tablet on Mondays and Fridays. Recheck INR in 1 week. Continue eating dark green leafy veggies 2-3 times a week.  Coumadin Clinic [phone]

## 2018-12-08 DIAGNOSIS — N2581 Secondary hyperparathyroidism of renal origin: Secondary | ICD-10-CM | POA: Diagnosis not present

## 2018-12-08 DIAGNOSIS — D509 Iron deficiency anemia, unspecified: Secondary | ICD-10-CM | POA: Diagnosis not present

## 2018-12-08 DIAGNOSIS — N186 End stage renal disease: Secondary | ICD-10-CM | POA: Diagnosis not present

## 2018-12-08 DIAGNOSIS — Z992 Dependence on renal dialysis: Secondary | ICD-10-CM | POA: Diagnosis not present

## 2018-12-08 DIAGNOSIS — E876 Hypokalemia: Secondary | ICD-10-CM | POA: Diagnosis not present

## 2018-12-08 DIAGNOSIS — D631 Anemia in chronic kidney disease: Secondary | ICD-10-CM | POA: Diagnosis not present

## 2018-12-13 DIAGNOSIS — D631 Anemia in chronic kidney disease: Secondary | ICD-10-CM | POA: Diagnosis not present

## 2018-12-13 DIAGNOSIS — E876 Hypokalemia: Secondary | ICD-10-CM | POA: Diagnosis not present

## 2018-12-13 DIAGNOSIS — N186 End stage renal disease: Secondary | ICD-10-CM | POA: Diagnosis not present

## 2018-12-13 DIAGNOSIS — N2581 Secondary hyperparathyroidism of renal origin: Secondary | ICD-10-CM | POA: Diagnosis not present

## 2018-12-13 DIAGNOSIS — Z992 Dependence on renal dialysis: Secondary | ICD-10-CM | POA: Diagnosis not present

## 2018-12-13 DIAGNOSIS — D509 Iron deficiency anemia, unspecified: Secondary | ICD-10-CM | POA: Diagnosis not present

## 2018-12-14 ENCOUNTER — Ambulatory Visit (HOSPITAL_COMMUNITY): Payer: Medicare Other | Admitting: Physician Assistant

## 2018-12-14 ENCOUNTER — Ambulatory Visit (INDEPENDENT_AMBULATORY_CARE_PROVIDER_SITE_OTHER): Payer: Medicare Other | Admitting: *Deleted

## 2018-12-14 ENCOUNTER — Other Ambulatory Visit: Payer: Self-pay

## 2018-12-14 DIAGNOSIS — I4891 Unspecified atrial fibrillation: Secondary | ICD-10-CM | POA: Diagnosis not present

## 2018-12-14 DIAGNOSIS — Z7901 Long term (current) use of anticoagulants: Secondary | ICD-10-CM | POA: Diagnosis not present

## 2018-12-14 LAB — POCT INR: INR: 4.5 — AB (ref 2.0–3.0)

## 2018-12-14 NOTE — Patient Instructions (Signed)
Description    Hold Coumadin today and tomorrow, then continue taking 1.5 tablets daily except 1 tablet on Mondays and Fridays. Recheck INR in 1 week possible DCCV. Coumadin Clinic [phone]

## 2018-12-15 DIAGNOSIS — E876 Hypokalemia: Secondary | ICD-10-CM | POA: Diagnosis not present

## 2018-12-15 DIAGNOSIS — D509 Iron deficiency anemia, unspecified: Secondary | ICD-10-CM | POA: Diagnosis not present

## 2018-12-15 DIAGNOSIS — Z992 Dependence on renal dialysis: Secondary | ICD-10-CM | POA: Diagnosis not present

## 2018-12-15 DIAGNOSIS — N2581 Secondary hyperparathyroidism of renal origin: Secondary | ICD-10-CM | POA: Diagnosis not present

## 2018-12-15 DIAGNOSIS — D631 Anemia in chronic kidney disease: Secondary | ICD-10-CM | POA: Diagnosis not present

## 2018-12-15 DIAGNOSIS — N186 End stage renal disease: Secondary | ICD-10-CM | POA: Diagnosis not present

## 2018-12-19 ENCOUNTER — Encounter (HOSPITAL_COMMUNITY): Payer: Self-pay | Admitting: *Deleted

## 2018-12-19 ENCOUNTER — Ambulatory Visit (HOSPITAL_COMMUNITY): Payer: Medicare Other | Admitting: Physician Assistant

## 2018-12-20 DIAGNOSIS — D631 Anemia in chronic kidney disease: Secondary | ICD-10-CM | POA: Diagnosis not present

## 2018-12-20 DIAGNOSIS — N2581 Secondary hyperparathyroidism of renal origin: Secondary | ICD-10-CM | POA: Diagnosis not present

## 2018-12-20 DIAGNOSIS — D509 Iron deficiency anemia, unspecified: Secondary | ICD-10-CM | POA: Diagnosis not present

## 2018-12-20 DIAGNOSIS — Z992 Dependence on renal dialysis: Secondary | ICD-10-CM | POA: Diagnosis not present

## 2018-12-20 DIAGNOSIS — N186 End stage renal disease: Secondary | ICD-10-CM | POA: Diagnosis not present

## 2018-12-20 DIAGNOSIS — E876 Hypokalemia: Secondary | ICD-10-CM | POA: Diagnosis not present

## 2018-12-20 NOTE — Telephone Encounter (Signed)
It appears that the patient canceled his appointment with the A. fib clinic on 9/11.  He had an appointment rescheduled for 9/16 for which there is no note in epic.  He has an appointment scheduled with Dr. [name] on 10/12.  There is a question of whether patient will need DCCV if still in A. fib.  If that were to occur, his anticoagulation could not be interrupted.  Per the surgeon, his procedure is not urgent and he would rather the A. fib be handled first.  If we cannot get him into the A. fib clinic, apparently he will be managed at his visit with Dr. [name].  Preop clearance added to the office visit notes.   I will route this message to Dr. [name].

## 2018-12-20 NOTE — Telephone Encounter (Signed)
Thanks for update °Malekai Nasia Cannan, MD ° °

## 2018-12-21 ENCOUNTER — Ambulatory Visit (INDEPENDENT_AMBULATORY_CARE_PROVIDER_SITE_OTHER): Payer: Medicare Other

## 2018-12-21 ENCOUNTER — Other Ambulatory Visit: Payer: Self-pay

## 2018-12-21 DIAGNOSIS — I4891 Unspecified atrial fibrillation: Secondary | ICD-10-CM | POA: Diagnosis not present

## 2018-12-21 DIAGNOSIS — Z7901 Long term (current) use of anticoagulants: Secondary | ICD-10-CM

## 2018-12-21 LAB — POCT INR: INR: 3.3 — AB (ref 2.0–3.0)

## 2018-12-21 NOTE — Patient Instructions (Signed)
Description   Take 1/2 tablet today, then start taking 1.5 tablets daily except 1 tablet on Mondays, Wednesdays and Fridays. Recheck INR in 1 week possible DCCV. Coumadin Clinic [phone]

## 2018-12-22 DIAGNOSIS — D631 Anemia in chronic kidney disease: Secondary | ICD-10-CM | POA: Diagnosis not present

## 2018-12-22 DIAGNOSIS — Z992 Dependence on renal dialysis: Secondary | ICD-10-CM | POA: Diagnosis not present

## 2018-12-22 DIAGNOSIS — N186 End stage renal disease: Secondary | ICD-10-CM | POA: Diagnosis not present

## 2018-12-22 DIAGNOSIS — D509 Iron deficiency anemia, unspecified: Secondary | ICD-10-CM | POA: Diagnosis not present

## 2018-12-22 DIAGNOSIS — N2581 Secondary hyperparathyroidism of renal origin: Secondary | ICD-10-CM | POA: Diagnosis not present

## 2018-12-22 DIAGNOSIS — E876 Hypokalemia: Secondary | ICD-10-CM | POA: Diagnosis not present

## 2018-12-26 ENCOUNTER — Encounter (HOSPITAL_COMMUNITY): Payer: Self-pay | Admitting: Physician Assistant

## 2018-12-26 ENCOUNTER — Ambulatory Visit (HOSPITAL_COMMUNITY)
Admission: RE | Admit: 2018-12-26 | Discharge: 2018-12-26 | Disposition: A | Discharge status: Home / Self Care | Payer: Medicare Other | Source: Ambulatory Visit | Attending: Physician Assistant | Admitting: Physician Assistant

## 2018-12-26 ENCOUNTER — Telehealth: Payer: Self-pay | Admitting: *Deleted

## 2018-12-26 ENCOUNTER — Other Ambulatory Visit: Payer: Self-pay

## 2018-12-26 VITALS — BP 110/80 | HR 99

## 2018-12-26 DIAGNOSIS — Z888 Allergy status to other drugs, medicaments and biological substances status: Secondary | ICD-10-CM | POA: Diagnosis not present

## 2018-12-26 DIAGNOSIS — I4819 Other persistent atrial fibrillation: Secondary | ICD-10-CM | POA: Diagnosis not present

## 2018-12-26 DIAGNOSIS — I120 Hypertensive chronic kidney disease with stage 5 chronic kidney disease or end stage renal disease: Secondary | ICD-10-CM | POA: Diagnosis not present

## 2018-12-26 DIAGNOSIS — Z79899 Other long term (current) drug therapy: Secondary | ICD-10-CM | POA: Insufficient documentation

## 2018-12-26 DIAGNOSIS — Z7901 Long term (current) use of anticoagulants: Secondary | ICD-10-CM | POA: Insufficient documentation

## 2018-12-26 DIAGNOSIS — I444 Left anterior fascicular block: Secondary | ICD-10-CM | POA: Diagnosis not present

## 2018-12-26 DIAGNOSIS — I484 Atypical atrial flutter: Secondary | ICD-10-CM | POA: Diagnosis not present

## 2018-12-26 DIAGNOSIS — N186 End stage renal disease: Secondary | ICD-10-CM | POA: Insufficient documentation

## 2018-12-26 LAB — BASIC METABOLIC PANEL WITH GFR
Anion gap: 16 — ABNORMAL HIGH (ref 5–15)
BUN: 38 mg/dL — ABNORMAL HIGH (ref 6–20)
CO2: 23 mmol/L (ref 22–32)
Calcium: 7.3 mg/dL — ABNORMAL LOW (ref 8.9–10.3)
Chloride: 102 mmol/L (ref 98–111)
Creatinine, Ser: 9.24 mg/dL — ABNORMAL HIGH (ref 0.61–1.24)
GFR calc Af Amer: 7 mL/min — ABNORMAL LOW
GFR calc non Af Amer: 6 mL/min — ABNORMAL LOW
Glucose, Bld: 88 mg/dL (ref 70–99)
Potassium: 4.1 mmol/L (ref 3.5–5.1)
Sodium: 141 mmol/L (ref 135–145)

## 2018-12-26 LAB — CBC
HCT: 34.9 % — ABNORMAL LOW (ref 39.0–52.0)
Hemoglobin: 10.3 g/dL — ABNORMAL LOW (ref 13.0–17.0)
MCH: 29.3 pg (ref 26.0–34.0)
MCHC: 29.5 g/dL — ABNORMAL LOW (ref 30.0–36.0)
MCV: 99.4 fL (ref 80.0–100.0)
Platelets: 124 10*3/uL — ABNORMAL LOW (ref 150–400)
RBC: 3.51 MIL/uL — ABNORMAL LOW (ref 4.22–5.81)
RDW: 17.6 % — ABNORMAL HIGH (ref 11.5–15.5)
WBC: 6.2 10*3/uL (ref 4.0–10.5)
nRBC: 0 % (ref 0.0–0.2)

## 2018-12-26 NOTE — Progress Notes (Signed)
Primary Care Physician: Clent Demark, PA-C Primary Cardiologist: Dr [name] Primary Electrophysiologist: none Referring Physician: Kerin Ransom PA-C   Corey [name] is a 48 y.o. male with a history of aortic dissection s/p repeair 2004, ESRD, HTN, persistent atrial fibrillation and atrial flutter who presents for consultation in the [facility].  The patient was initially diagnosed with atrial fibrillation in 2014 in [place]. He was recently admitted after presenting to the ER with heart racing and found to be in afib with RVR. He was started on amiodarone and converted to SR. He has done well on amiodarone with much less heart racing until two weeks ago. His BP machine at home has shown heart rates 100-115 bpm. He denies chest pain, SOB, or dizziness. He does reports that around the time of his afib onset, he got news that two of his brothers were hospitalized with COVID-19 and one was in ICU on a ventilator. He denies snoring or significant alcohol use.   On follow up today, patient reports that he continues to have weakness and fatigue. He is in atrial flutter today. He reports that the metoprolol has helped only minimally. Plans for parathyroidectomy noted. Epic phone note reads "per Dr. Tera Helper, the patient's surgery is not urgent. He would like for patient's afib to be dealt with first."  Today, he denies symptoms of chest pain, shortness of breath, orthopnea, PND, lower extremity edema, dizziness, presyncope, syncope, snoring, daytime somnolence, bleeding, or neurologic sequela. The patient is tolerating medications without difficulties and is otherwise without complaint today.    Atrial Fibrillation Risk Factors:  he does not have symptoms or diagnosis of sleep apnea. he does not have a history of rheumatic fever. he does not have a history of alcohol use. The patient does have a history of early familial atrial fibrillation or other arrhythmias. Brother  has afib.  he has a BMI of There is no height or weight on file to calculate BMI.. There were no vitals filed for this visit.  Family History  Problem Relation Age of Onset  . Cancer Mother   . Hypertension Mother   . Cancer Father   . Hypertension Father   . Thyroid disease Sister   . Stroke Brother        11     Atrial Fibrillation Management history:  Previous antiarrhythmic drugs: amiodarone Previous cardioversions: 2014 Previous ablations: none CHADS2VASC score: 2 Anticoagulation history: Warfarin    Past Medical History:  Diagnosis Date  . Arthritis    hands and shoulders  . Blindness and low vision    "Stargardt disease"  . Dissection of aorta ([place]) 2004   a. s/p extensive repeair in [id number] in [place] complicated by ESRD, lower extremity paralysis, coma, and extended hospitalization of 2 years  . ESRD (end stage renal disease) ([place])    a. TTS  . Headache   . History of cardioversion 2014  . Hypertension   . Neuropathy   . Non-healing non-surgical wound 03/2016  . PAF (paroxysmal atrial fibrillation) ([place])    a. s/p DCCV in 2014; b. on Coumadin; c. CHADS2VASc => 2 (HTN, vascular disease)  . Paralysis ([place])    due to dissection of aorta in 2004, lower extremities  . Pneumonia    Past Surgical History:  Procedure Laterality Date  . APPLICATION OF WOUND VAC Left 04/13/2016   Procedure: APPLICATION OF WOUND VAC;  Surgeon: Conrad Maribel, MD;  Location: [facility];  Service: Vascular;  Laterality: Left;  . APPLICATION OF WOUND VAC Left 04/18/2016   Procedure: APPLICATION OF WOUND VAC;  Surgeon: Waynetta Sandy, MD;  Location: [place];  Service: Vascular;  Laterality: Left;  Wound vac change   . APPLICATION OF WOUND VAC Left 04/20/2016   Procedure: WOUND VAC CHANGE;  Surgeon: Conrad [place], MD;  Location: [facility];  Service: Vascular;  Laterality: Left;  . AV FISTULA PLACEMENT    . BASCILIC VEIN TRANSPOSITION Left 12/09/2015   Procedure: FIRST STAGE BASILIC VEIN TRANSPOSITION  LEFT UPPER ARM;  Surgeon: Conrad Republic, MD;  Location: [facility];  Service: Vascular;  Laterality: Left;  . BASCILIC VEIN TRANSPOSITION Left 03/09/2016   Procedure: SECOND STAGE BASILIC VEIN TRANSPOSITION WITH REVISION OF ANASTOMOSIS LEFT UPPER ARM;  Surgeon: Conrad Alleghany, MD;  Location: [facility];  Service: Vascular;  Laterality: Left;  . CARDIOVERSION    . REPAIR OF ACUTE ASCENDING THORACIC AORTIC DISSECTION    . REVISON OF ARTERIOVENOUS FISTULA Left 04/20/2016   Procedure: LIGATION OF BASILIC VEIN TRANSPOSITION;  Surgeon: Conrad Hood, MD;  Location: [facility];  Service: Vascular;  Laterality: Left;  . WOUND DEBRIDEMENT Left 04/13/2016   Procedure: DEBRIDEMENT WOUND;  Surgeon: Conrad Goodrich, MD;  Location: [facility];  Service: Vascular;  Laterality: Left;    Current Outpatient Medications  Medication Sig Dispense Refill  . amiodarone (PACERONE) 200 MG tablet Take 1 tablet (200 mg total) by mouth daily. 90 tablet 3  . B Complex-C-Folic Acid (RENAL-VITE) 0.8 MG TABS Take by mouth.    . cinacalcet (SENSIPAR) 90 MG tablet TAKE 1 TABLET BY MOUTH EVERY DAY WITH DINNER AS DIRECTED    . metoprolol tartrate (LOPRESSOR) 25 MG tablet Take 0.5 tablets (12.5 mg total) by mouth 2 (two) times daily. Hold the AM of dialysis days 30 tablet 3  . Multiple Vitamins-Minerals (MULTIVITAMIN WITH MINERALS) tablet Take 1 tablet by mouth daily.    . sevelamer carbonate (RENVELA) 0.8 g PACK packet Take 1.6 g by mouth 3 (three) times daily with meals. (Patient taking differently: Take 0.8-1.6 g by mouth See admin instructions. Take 1.6g with meals and 0.8g with snacks) 270 each 0  . warfarin (COUMADIN) 5 MG tablet Take as directed by coumadin clinic 50 tablet 0   No current facility-administered medications for this encounter.     Allergies  Allergen Reactions  . Ciprofloxacin Other (See Comments)    Aortic dissection  . Heparin Other (See Comments)    UNSPECIFIED REACTION :  On Coumadin since 2004   HIT panel negative 01/19/17   . Quinolones     Social History   Socioeconomic History  . Marital status: Divorced    Spouse name: Not on file  . Number of children: 2  . Years of education: Not on file  . Highest education level: Not on file  Occupational History  . Occupation: DIABLED  Social Needs  . Financial resource strain: Not on file  . Food insecurity    Worry: Not on file    Inability: Not on file  . Transportation needs    Medical: Not on file    Non-medical: Not on file  Tobacco Use  . Smoking status: Never Smoker  . Smokeless tobacco: Never Used  Substance and Sexual Activity  . Alcohol use: No  . Drug use: No  . Sexual activity: Not on file  Lifestyle  . Physical activity    Days per week: Not on file    Minutes  per session: Not on file  . Stress: Not on file  Relationships  . Social Herbalist on phone: Not on file    Gets together: Not on file    Attends religious service: Not on file    Active member of club or organization: Not on file    Attends meetings of clubs or organizations: Not on file    Relationship status: Not on file  . Intimate partner violence    Fear of current or ex partner: Not on file    Emotionally abused: Not on file    Physically abused: Not on file    Forced sexual activity: Not on file  Other Topics Concern  . Not on file  Social History Narrative  . Not on file     ROS- All systems are reviewed and negative except as per the HPI above.  Physical Exam: Vitals:   12/26/18 1401  BP: 110/80  Pulse: 99    GEN- The patient is well appearing, alert and oriented x 3 today.   HEENT-head normocephalic, atraumatic, sclera clear, conjunctiva pink, hearing intact, trachea midline. Lungs- Clear to ausculation bilaterally, normal work of breathing Heart- irregular rate and rhythm, no murmurs, rubs or gallops  GI- soft, NT, ND, + BS Extremities- no clubbing, cyanosis, or edema MS- no significant deformity or atrophy Skin- no rash or lesion  Psych- euthymic mood, full affect Neuro- in wheelchair   Wt Readings from Last 3 Encounters:  11/19/18 80 kg  06/18/18 88.9 kg  06/06/18 88.9 kg    EKG today demonstrates atypical atrial flutter with variable conduction, HR 99, LAFB, QRS 102, QTc 523  Echo 05/20/18 demonstrated  1. The left ventricle has normal systolic function with an ejection fraction of 60-65%. The cavity size was normal. There is mildly increased left ventricular wall thickness. Left ventricular diastology could not be evaluated due to nondiagnostic  images. No evidence of left ventricular regional wall motion abnormalities.  2. The right ventricle has normal systolic function. The cavity was normal. There is no increase in right ventricular wall thickness. Right ventricular systolic pressure is moderately elevated with an estimated pressure of 42.8 mmHg.  3. The pericardial effusion is posterior to the left ventricle.  4. Trivial pericardial effusion.  5. The mitral valve is normal in structure.  6. The tricuspid valve is normal in structure.  7. The aortic valve is tricuspid Mild sclerosis of the aortic valve.  8. The pulmonic valve was normal in structure.  9. The inferior vena cava was dilated in size with >50% respiratory variability. 10. No evidence of left ventricular regional wall motion abnormalities. 11. Right atrial pressure is estimated at 8 mmHg.   Epic records are reviewed at length today  Assessment and Plan:  1. Persistent atrial fibrillation/atrial flutter Patient appears to be in persistent afib.  We discussed therapeutic options including DCCV, patient agreeable to proceed.  Continue Lopressor 12.5 mg BID for rate control. Pt will hold on HD mornings. Continue warfarin with no interruption for one month post DCCV. Weekly INRs have been therapeutic.  Continue amiodarone 200 mg daily. Bmet/CBC today  This patients CHA2DS2-VASc Score and unadjusted Ischemic Stroke Rate (% per year) is equal  to 2.2 % stroke rate/year from a score of 2  Above score calculated as 1 point each if present [CHF, HTN, DM, Vascular=MI/PAD/Aortic Plaque, Age if 65-74, or Male] Above score calculated as 2 points each if present [Age > 75, or Stroke/TIA/TE]  2. HTN Stable, no changes today.  3. Aortic dissection S/p repair 2004, now with stable type B dissection. Followed by Dr [name] and Dr Servando Snare.  4. ESRD HD on T/Th/S.   Follow up with Dr [name] as scheduled.    [facility] [address] [place], [place] [address] [phone] 12/26/2018 2:41 PM

## 2018-12-26 NOTE — Telephone Encounter (Signed)
Pt has an appt on 12/28/2018 for next INR check and made him an appt on 01/04/2019 at 1015am since he will need an INR checked prior to DCCV at 11am per [facility].  Spoke with pt and he is aware that he needs to come to appt on 12/28/2018 and made an appt for 01/04/2019 prior to DCCV and he verbalized understanding.

## 2018-12-26 NOTE — Patient Instructions (Signed)
Cardioversion scheduled for Friday, October 2nd  - Have INR checked in coumadin clinic prior to arrival  - Arrive at the [facility] and go to admitting at [facility] not eat or drink anything after midnight the night prior to your procedure.  - Take all your morning medication with a sip of water prior to arrival.  - You will not be able to drive home after your procedure.

## 2018-12-26 NOTE — Telephone Encounter (Signed)
-----   Message from Juluis Mire, RN sent at 12/26/2018  2:32 PM EDT ----- Regarding: inr prior to dccv Pt is scheduled for dccv 10/2  has to arrive at 11am.  Thanks [facility]

## 2018-12-27 DIAGNOSIS — E876 Hypokalemia: Secondary | ICD-10-CM | POA: Diagnosis not present

## 2018-12-27 DIAGNOSIS — Z992 Dependence on renal dialysis: Secondary | ICD-10-CM | POA: Diagnosis not present

## 2018-12-27 DIAGNOSIS — D509 Iron deficiency anemia, unspecified: Secondary | ICD-10-CM | POA: Diagnosis not present

## 2018-12-27 DIAGNOSIS — D631 Anemia in chronic kidney disease: Secondary | ICD-10-CM | POA: Diagnosis not present

## 2018-12-27 DIAGNOSIS — N2581 Secondary hyperparathyroidism of renal origin: Secondary | ICD-10-CM | POA: Diagnosis not present

## 2018-12-27 DIAGNOSIS — N186 End stage renal disease: Secondary | ICD-10-CM | POA: Diagnosis not present

## 2018-12-28 ENCOUNTER — Ambulatory Visit (INDEPENDENT_AMBULATORY_CARE_PROVIDER_SITE_OTHER): Payer: Medicare Other | Admitting: *Deleted

## 2018-12-28 ENCOUNTER — Other Ambulatory Visit: Payer: Self-pay

## 2018-12-28 DIAGNOSIS — I4891 Unspecified atrial fibrillation: Secondary | ICD-10-CM | POA: Diagnosis not present

## 2018-12-28 DIAGNOSIS — Z7901 Long term (current) use of anticoagulants: Secondary | ICD-10-CM

## 2018-12-28 LAB — POCT INR: INR: 2.5 (ref 2.0–3.0)

## 2018-12-28 IMAGING — CT CT ANGIO CHEST-ABD-PELV FOR DISSECTION W/ AND WO/W CM
2 of 7 series · 11 of 46 positions shown, 12 images · IV contrast (APPLIED)
Comparison: None.

CLINICAL DATA: Known history of dissection with increased chest
pain

EXAM:
CT ANGIOGRAPHY CHEST, ABDOMEN AND PELVIS
TECHNIQUE: Multidetector CT imaging through the chest, abdomen and pelvis was
performed using the standard protocol during bolus administration of
intravenous contrast. Multiplanar reconstructed images and MIPs were
obtained and reviewed to evaluate the vascular anatomy.
CONTRAST:  90 mL Isovue 370.

[Series 5: arterial · axial · arterial · 0.71mm/px · z∈[+224,+766]mm · 8 of 349 slices shown, 9 images]
[im 39/349  soft-tissue]
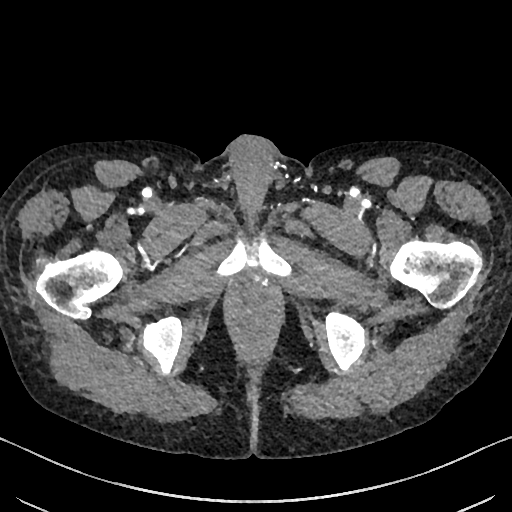
[im 39/349  bone]
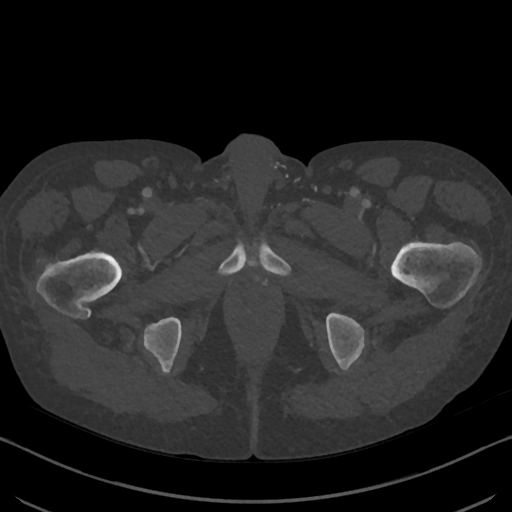
[im 78/349  soft-tissue]
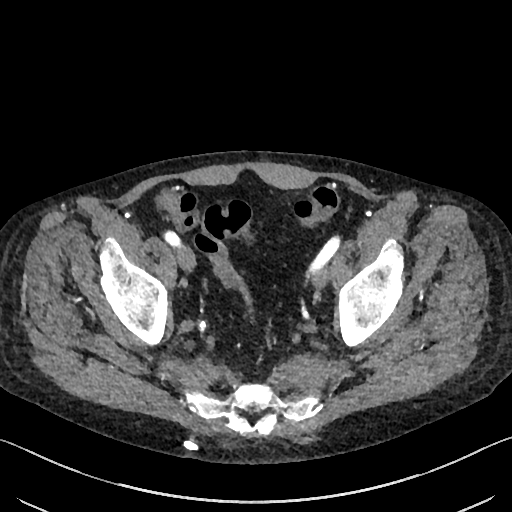
[im 117/349  soft-tissue]
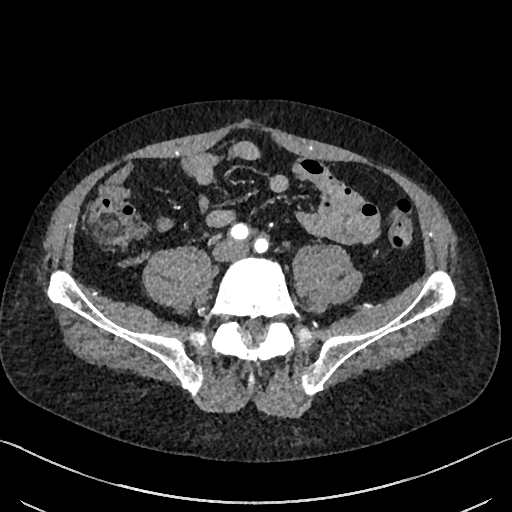
[im 155/349  soft-tissue]
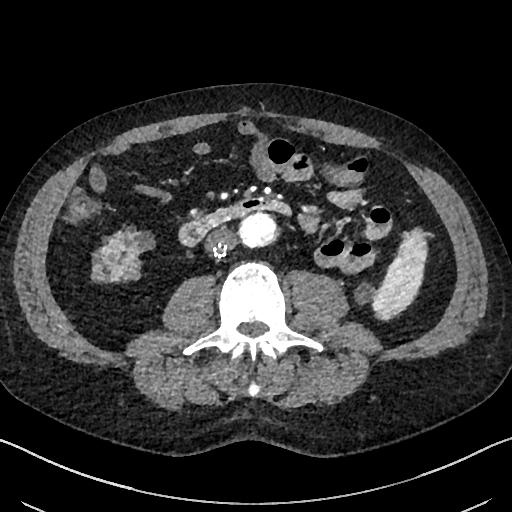
[im 194/349  soft-tissue]
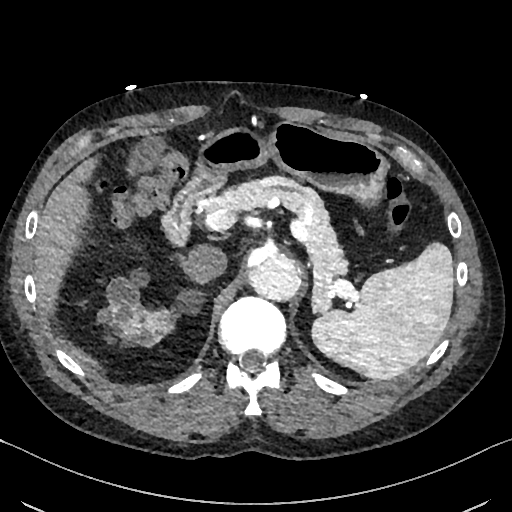
[im 233/349  soft-tissue]
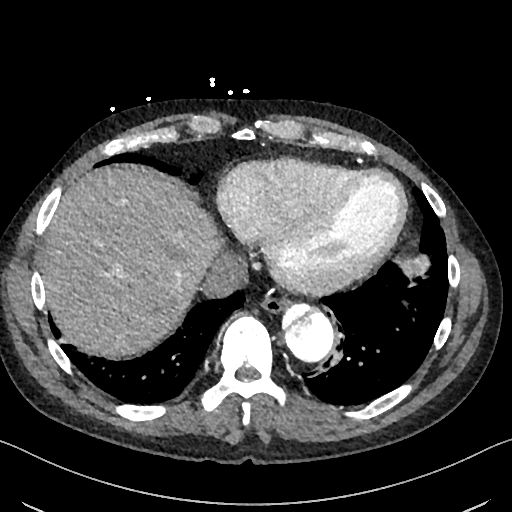
[im 271/349  soft-tissue]
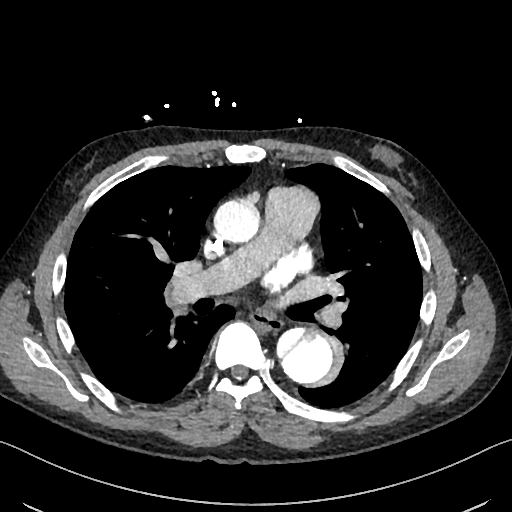
[im 310/349  soft-tissue]
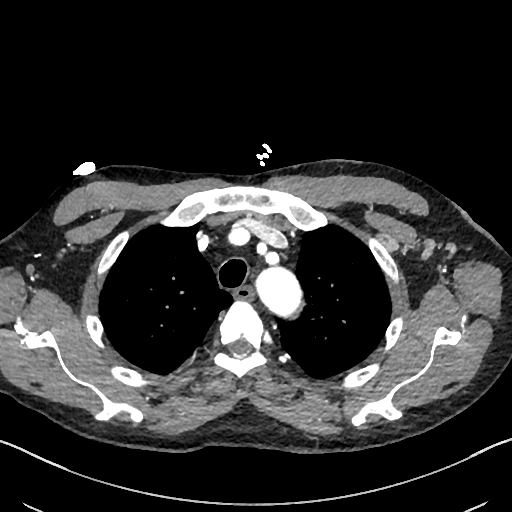

[Series 9: cor · coronal · 0.69mm/px · 3 of 110 slices shown]
[im 28/110  soft-tissue]
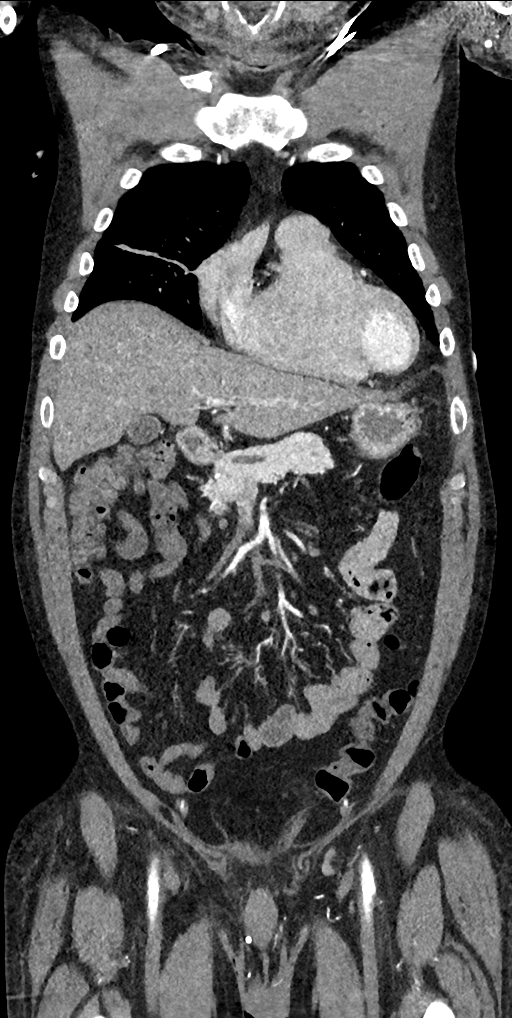
[im 55/110  soft-tissue]
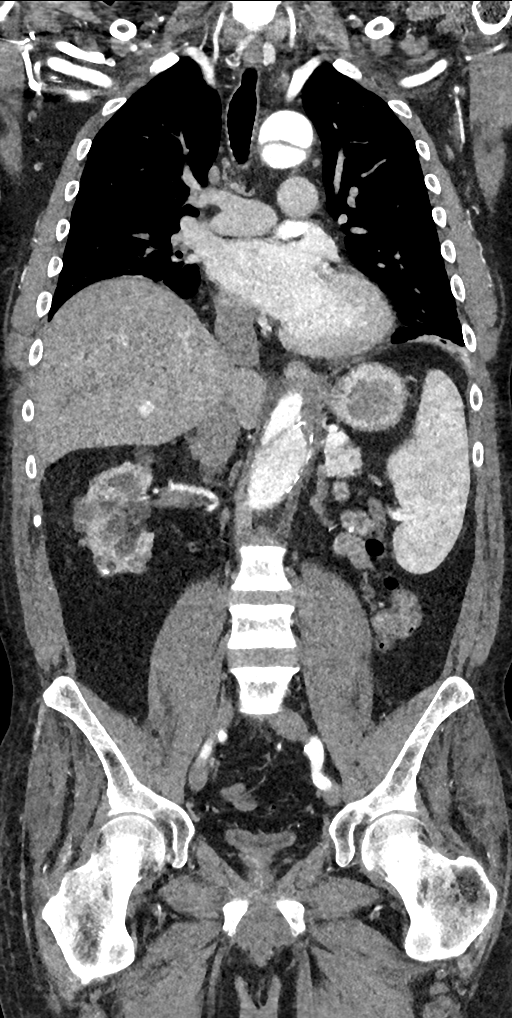
[im 82/110  soft-tissue]
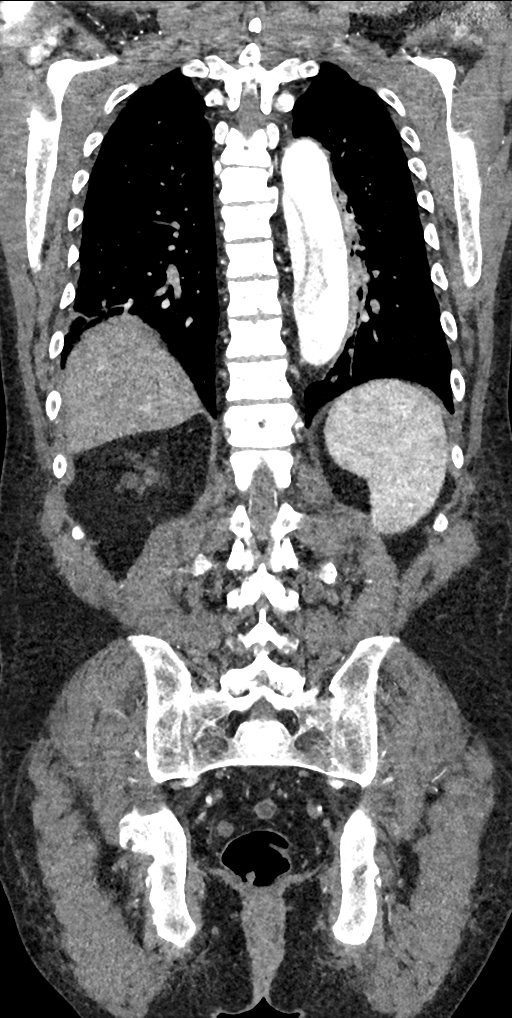

[11 of 46 positions shown; findings below may reference images not displayed]

FINDINGS: CTA CHEST FINDINGS

Cardiovascular: The ascending aorta is within normal limits without
evidence of dissection or aneurysmal dilatation. No valvular
replacement is noted. Just beyond the origin of the left subclavian
artery there is evidence of a dissection involving the descending
thoracic aorta and extending into the proximal abdominal aorta.
Significant increased density is noted within the contrast in the
false lumen related to poor flow. Some irregularity is noted
distally although this is felt to be related to mixing of opacified
and non-opacified blood. The thoracic aorta just beyond the intimal
defect measures approximately 4.2 cm in greatest dimension.
Descending thoracic aorta measures approximately 4.1 cm. Normal
tapering distally is noted. The true lumen is diminutive but patent
with supply to the celiac axis, superior mesenteric artery and right
renal artery. Postsurgical changes are noted just below the right
renal artery consistent with the known history of repair.

Mediastinum/Nodes: Thoracic inlet is within normal limits. No
significant hilar or mediastinal adenopathy is noted. The esophagus
as visualized is within normal limits. Dialysis catheter is noted on
the right.

Lungs/Pleura: Lungs are well aerated bilaterally. Mild basilar
atelectasis is noted in the right middle and right lower lobes as
well as in the left lower lobe. Some atelectatic changes are noted
in the left lower lobe adjacent to the dilated aorta.

Musculoskeletal: Some increased sclerosis is noted within the
vertebral bodies consistent with the patient's known history of
end-stage renal disease. No acute bony abnormality is noted.

Review of the MIP images confirms the above findings.

CTA ABDOMEN AND PELVIS FINDINGS

VASCULAR

Aorta: There changes consistent with a tube graft in the infrarenal
abdominal aorta which is widely patent. Small focal outpouching is
noted adjacent to the distal anastomotic site which may be related
to prior chronic dissection. As previously described the false lumen
an true lumen are opacified within the proximal abdominal aorta
although the true lumen is diminutive in size. There is apparent
fenestration distally just below the origin of the right renal
artery.

Celiac: The celiac axis is patent arising from the true lumen as
described. No focal stenosis is noted.

SMA: Widely patent arising from the distal aspect of the true lumen.

Renals: Single right renal artery is noted arising from the distal
aspect of the true lumen just prior to the distal fenestration. Left
renal artery is not visualized and may have been related to prior
nephrectomy although may be related to congenital absence of the
left kidney.

IMA: The inferior mesenteric artery is patent arising just below the
infrarenal aortic tube graft.

Iliacs: Widely patent without aneurysmal dilatation. Mild
atherosclerotic changes are noted.

Veins: No definitive venous abnormality is noted. There is an IVC
filter identified in the infrarenal IVC.

Review of the MIP images confirms the above findings.

NON-VASCULAR

Hepatobiliary: The liver and gallbladder are within normal limits.

Pancreas: Unremarkable. No pancreatic ductal dilatation or
surrounding inflammatory changes.

Spleen: Normal in size without focal abnormality.

Adrenals/Urinary Tract: The left kidney is absent. Clinical
correlation is recommended as to whether this is congenital or
related to prior surgery. Multiple cysts are noted within the
residual right kidney consistent with end-stage renal disease. No
obstructive changes are noted. The bladder is decompressed.

Stomach/Bowel: The appendix is within normal limits. No obstructive
or inflammatory changes of the bowel are seen.

Lymphatic: No significant lymphadenopathy is noted.

Reproductive: Prostate is unremarkable.

Other: No abdominal wall hernia or abnormality. No abdominopelvic
ascites.

Musculoskeletal: Increased sclerosis in the bony structures is noted
consistent with patient's known history of end-stage renal disease.

Review of the MIP images confirms the above findings.
IMPRESSION: Changes consistent with descending thoracic aortic dissection with
apparent proximal and distal fenestrations. Slow flow is noted
within the false lumen as described. Given the history of prior
dissection and repair this is likely chronic in appearance although
given the patient's current clinical symptomatology it would be
difficult to exclude an acute component. If prior imaging related to
the previous surgery could be obtained at comparison could be made.
The true lumen gives supply to the mesenteric vessels as described.
No proximal aortic abnormality is noted.

Changes consistent with end-stage renal disease. Polycystic changes
of the right kidney are noted.

Absent left kidney. It is uncertain whether this is postsurgical or
related to congenital absence.

Bilateral atelectatic changes.

Critical Value/emergent results were called by telephone at the time
of interpretation on 01/18/2017 at [DATE] to Dr. YASHNA
GERARDIN , who verbally acknowledged these results.

## 2018-12-28 MED ORDER — WARFARIN SODIUM 5 MG PO TABS: ORAL_TABLET | ORAL | 0 refills | Status: DC

## 2018-12-28 NOTE — Patient Instructions (Addendum)
  Description   Continue taking 1.5 tablets daily except 1 tablet on Mondays, Wednesdays and Fridays. Recheck INR in 1 week possible DCCV. Coumadin Clinic [phone]

## 2018-12-29 DIAGNOSIS — D631 Anemia in chronic kidney disease: Secondary | ICD-10-CM | POA: Diagnosis not present

## 2018-12-29 DIAGNOSIS — N2581 Secondary hyperparathyroidism of renal origin: Secondary | ICD-10-CM | POA: Diagnosis not present

## 2018-12-29 DIAGNOSIS — N186 End stage renal disease: Secondary | ICD-10-CM | POA: Diagnosis not present

## 2018-12-29 DIAGNOSIS — E876 Hypokalemia: Secondary | ICD-10-CM | POA: Diagnosis not present

## 2018-12-29 DIAGNOSIS — Z992 Dependence on renal dialysis: Secondary | ICD-10-CM | POA: Diagnosis not present

## 2018-12-29 DIAGNOSIS — D509 Iron deficiency anemia, unspecified: Secondary | ICD-10-CM | POA: Diagnosis not present

## 2019-01-01 ENCOUNTER — Other Ambulatory Visit (HOSPITAL_COMMUNITY)
Admission: RE | Admit: 2019-01-01 | Discharge: 2019-01-01 | Disposition: A | Discharge status: Home / Self Care | Payer: Medicare Other | Source: Ambulatory Visit | Attending: Cardiology | Admitting: Cardiology

## 2019-01-01 DIAGNOSIS — Z20828 Contact with and (suspected) exposure to other viral communicable diseases: Secondary | ICD-10-CM | POA: Insufficient documentation

## 2019-01-01 DIAGNOSIS — I4891 Unspecified atrial fibrillation: Secondary | ICD-10-CM | POA: Insufficient documentation

## 2019-01-01 DIAGNOSIS — Z01812 Encounter for preprocedural laboratory examination: Secondary | ICD-10-CM | POA: Diagnosis not present

## 2019-01-02 LAB — NOVEL CORONAVIRUS, NAA (HOSP ORDER, SEND-OUT TO REF LAB; TAT 18-24 HRS): SARS-CoV-2, NAA: NOT DETECTED

## 2019-01-03 DIAGNOSIS — Z7901 Long term (current) use of anticoagulants: Secondary | ICD-10-CM | POA: Diagnosis not present

## 2019-01-03 DIAGNOSIS — Z1159 Encounter for screening for other viral diseases: Secondary | ICD-10-CM | POA: Diagnosis not present

## 2019-01-03 DIAGNOSIS — G839 Paralytic syndrome, unspecified: Secondary | ICD-10-CM | POA: Diagnosis not present

## 2019-01-03 DIAGNOSIS — D631 Anemia in chronic kidney disease: Secondary | ICD-10-CM | POA: Diagnosis not present

## 2019-01-03 DIAGNOSIS — N2581 Secondary hyperparathyroidism of renal origin: Secondary | ICD-10-CM | POA: Diagnosis not present

## 2019-01-03 DIAGNOSIS — I120 Hypertensive chronic kidney disease with stage 5 chronic kidney disease or end stage renal disease: Secondary | ICD-10-CM | POA: Diagnosis not present

## 2019-01-03 DIAGNOSIS — I739 Peripheral vascular disease, unspecified: Secondary | ICD-10-CM | POA: Diagnosis not present

## 2019-01-03 DIAGNOSIS — Z79899 Other long term (current) drug therapy: Secondary | ICD-10-CM | POA: Diagnosis not present

## 2019-01-03 DIAGNOSIS — I4891 Unspecified atrial fibrillation: Secondary | ICD-10-CM | POA: Diagnosis not present

## 2019-01-03 DIAGNOSIS — I4819 Other persistent atrial fibrillation: Secondary | ICD-10-CM | POA: Diagnosis not present

## 2019-01-03 DIAGNOSIS — Z992 Dependence on renal dialysis: Secondary | ICD-10-CM | POA: Diagnosis not present

## 2019-01-03 DIAGNOSIS — D509 Iron deficiency anemia, unspecified: Secondary | ICD-10-CM | POA: Diagnosis not present

## 2019-01-03 DIAGNOSIS — N186 End stage renal disease: Secondary | ICD-10-CM | POA: Diagnosis not present

## 2019-01-03 DIAGNOSIS — I129 Hypertensive chronic kidney disease with stage 1 through stage 4 chronic kidney disease, or unspecified chronic kidney disease: Secondary | ICD-10-CM | POA: Diagnosis not present

## 2019-01-03 DIAGNOSIS — I4892 Unspecified atrial flutter: Secondary | ICD-10-CM | POA: Diagnosis not present

## 2019-01-03 DIAGNOSIS — G629 Polyneuropathy, unspecified: Secondary | ICD-10-CM | POA: Diagnosis not present

## 2019-01-03 NOTE — Progress Notes (Signed)
Attempted pre op call for patient's procedure tomorrow, no answer and automated voicemail.

## 2019-01-04 ENCOUNTER — Other Ambulatory Visit: Payer: Self-pay

## 2019-01-04 ENCOUNTER — Encounter (HOSPITAL_COMMUNITY)
Admission: RE | Disposition: A | Discharge status: Home / Self Care | Payer: Self-pay | Source: Home / Self Care | Attending: Cardiology

## 2019-01-04 ENCOUNTER — Encounter (HOSPITAL_COMMUNITY): Payer: Self-pay | Admitting: Cardiology

## 2019-01-04 ENCOUNTER — Ambulatory Visit (HOSPITAL_COMMUNITY): Payer: Medicare Other | Admitting: Certified Registered Nurse Anesthetist

## 2019-01-04 ENCOUNTER — Ambulatory Visit (HOSPITAL_COMMUNITY)
Admission: RE | Admit: 2019-01-04 | Discharge: 2019-01-04 | Disposition: A | Discharge status: Home / Self Care | Payer: Medicare Other | Attending: Cardiology | Admitting: Cardiology

## 2019-01-04 ENCOUNTER — Ambulatory Visit (INDEPENDENT_AMBULATORY_CARE_PROVIDER_SITE_OTHER): Payer: Medicare Other | Admitting: *Deleted

## 2019-01-04 ENCOUNTER — Other Ambulatory Visit: Payer: Self-pay | Admitting: Interventional Cardiology

## 2019-01-04 DIAGNOSIS — Z1159 Encounter for screening for other viral diseases: Secondary | ICD-10-CM | POA: Diagnosis not present

## 2019-01-04 DIAGNOSIS — G629 Polyneuropathy, unspecified: Secondary | ICD-10-CM | POA: Insufficient documentation

## 2019-01-04 DIAGNOSIS — Z7901 Long term (current) use of anticoagulants: Secondary | ICD-10-CM | POA: Diagnosis not present

## 2019-01-04 DIAGNOSIS — N186 End stage renal disease: Secondary | ICD-10-CM | POA: Diagnosis not present

## 2019-01-04 DIAGNOSIS — I4819 Other persistent atrial fibrillation: Secondary | ICD-10-CM | POA: Insufficient documentation

## 2019-01-04 DIAGNOSIS — Z992 Dependence on renal dialysis: Secondary | ICD-10-CM | POA: Diagnosis not present

## 2019-01-04 DIAGNOSIS — I739 Peripheral vascular disease, unspecified: Secondary | ICD-10-CM | POA: Diagnosis not present

## 2019-01-04 DIAGNOSIS — I120 Hypertensive chronic kidney disease with stage 5 chronic kidney disease or end stage renal disease: Secondary | ICD-10-CM | POA: Insufficient documentation

## 2019-01-04 DIAGNOSIS — I4892 Unspecified atrial flutter: Secondary | ICD-10-CM | POA: Diagnosis not present

## 2019-01-04 DIAGNOSIS — Z79899 Other long term (current) drug therapy: Secondary | ICD-10-CM | POA: Diagnosis not present

## 2019-01-04 DIAGNOSIS — I4891 Unspecified atrial fibrillation: Secondary | ICD-10-CM

## 2019-01-04 DIAGNOSIS — G839 Paralytic syndrome, unspecified: Secondary | ICD-10-CM | POA: Diagnosis not present

## 2019-01-04 HISTORY — PX: CARDIOVERSION: SHX1299

## 2019-01-04 LAB — POCT I-STAT 4, (NA,K, GLUC, HGB,HCT)
Glucose, Bld: 68 mg/dL — ABNORMAL LOW (ref 70–99)
HCT: 34 % — ABNORMAL LOW (ref 39.0–52.0)
Hemoglobin: 11.6 g/dL — ABNORMAL LOW (ref 13.0–17.0)
Potassium: 3.5 mmol/L (ref 3.5–5.1)
Sodium: 140 mmol/L (ref 135–145)

## 2019-01-04 LAB — POCT INR: INR: 6.3 — AB (ref 2.0–3.0)

## 2019-01-04 LAB — PROTIME-INR
INR: 4.8 — ABNORMAL HIGH (ref 0.8–1.2)
Prothrombin Time: 51.3 s — ABNORMAL HIGH (ref 9.1–12.0)

## 2019-01-04 SURGERY — CARDIOVERSION
Anesthesia: General

## 2019-01-04 MED ORDER — PROPOFOL 10 MG/ML IV BOLUS
INTRAVENOUS | Status: DC | PRN
  Administered 2019-01-04: 70 mg via INTRAVENOUS

## 2019-01-04 MED ORDER — SODIUM CHLORIDE 0.9 % IV SOLN
INTRAVENOUS | Status: DC | PRN
  Administered 2019-01-04: 12:00:00 via INTRAVENOUS

## 2019-01-04 MED ORDER — LIDOCAINE 2% (20 MG/ML) 5 ML SYRINGE
INTRAMUSCULAR | Status: DC | PRN
  Administered 2019-01-04: 30 mg via INTRAVENOUS

## 2019-01-04 NOTE — Transfer of Care (Signed)
Immediate Anesthesia Transfer of Care Note  Patient: Corey [name]  Procedure(s) Performed: CARDIOVERSION (N/A )  Patient Location: Endoscopy Unit  Anesthesia Type:General  Level of Consciousness: drowsy and patient cooperative  Airway & Oxygen Therapy: Patient Spontanous Breathing  Post-op Assessment: Report given to RN, Post -op Vital signs reviewed and stable and Patient moving all extremities X 4  Post vital signs: Reviewed and stable  Last Vitals:  Vitals Value Taken Time  BP    Temp    Pulse    Resp    SpO2      Last Pain:  Vitals:   01/04/19 1046  TempSrc: Oral  PainSc: 6          Complications: No apparent anesthesia complications

## 2019-01-04 NOTE — Patient Instructions (Signed)
Description   10/2 labs resulted at INR of 4.8; pt in procedure will dose afterwards. At 304pm, spoke with pt and instructed to hold today and hold tomorrow's dose then start taking 1 tablet daily except 1.5 tablets on Tuesday, Thursday, and Saturday. Recheck INR in 1 week and resume normal green intake and diet. 10/2 at 935a-Do not take any Coumadin until we speak with you, taken to the lab for STAT INR. At 940am-called [facility] spoke with Marzetta Board to inform them.  DCCV. Coumadin Clinic [phone]

## 2019-01-04 NOTE — Interval H&P Note (Signed)
History and Physical Interval Note:  01/04/2019 10:24 AM  Corey [name]  has presented today for surgery, with the diagnosis of AFIB.  The various methods of treatment have been discussed with the patient and family. After consideration of risks, benefits and other options for treatment, the patient has consented to  Procedure(s): CARDIOVERSION (N/A) as a surgical intervention.  The patient's history has been reviewed, patient examined, no change in status, stable for surgery.  I have reviewed the patient's chart and labs.  Questions were answered to the patient's satisfaction.     [name]

## 2019-01-04 NOTE — Procedures (Signed)
Electrical Cardioversion Procedure Note Corey [name] [id number] 1970/10/29  Procedure: Electrical Cardioversion Indications:  Atrial Flutter  Procedure Details Consent: Risks of procedure as well as the alternatives and risks of each were explained to the (patient/caregiver).  Consent for procedure obtained. Time Out: Verified patient identification, verified procedure, site/side was marked, verified correct patient position, special equipment/implants available, medications/allergies/relevent history reviewed, required imaging and test results available.  Performed  Patient placed on cardiac monitor, pulse oximetry, supplemental oxygen as necessary.  Sedation given: Pt sedated by anesthesia with lidocaine 20 mg and diprovan 70 mg IV. Pacer pads placed anterior and posterior chest.  Cardioverted 1 time(s).  Cardioverted at 120J.  Evaluation Findings: Post procedure EKG shows: NSR Complications: None Patient did tolerate procedure well.   Corey [name] 01/04/2019, 10:21 AM

## 2019-01-04 NOTE — Anesthesia Postprocedure Evaluation (Signed)
Anesthesia Post Note  Patient: Corey [name]  Procedure(s) Performed: CARDIOVERSION (N/A )     Patient location during evaluation: Endoscopy Anesthesia Type: General Level of consciousness: awake and alert, patient cooperative and oriented Pain management: pain level controlled Vital Signs Assessment: post-procedure vital signs reviewed and stable Respiratory status: spontaneous breathing, nonlabored ventilation and respiratory function stable Cardiovascular status: blood pressure returned to baseline and stable Postop Assessment: no apparent nausea or vomiting Anesthetic complications: no    Last Vitals:  Vitals:   01/04/19 1234 01/04/19 1236  BP: 93/73   Pulse: 65   Resp: (!) 23   Temp:  36.6 C  SpO2: 96%     Last Pain:  Vitals:   01/04/19 1236  TempSrc: Axillary  PainSc:                  Leonila Speranza,E. Shanetha Bradham

## 2019-01-04 NOTE — Progress Notes (Signed)
Patient had his brother ride the [employer] bus with him today to be his responsible party when he went home. After procedure was done and patient was fully recovered I wheeled patient (in own wheelchair) to his brother in waiting room and gave brother discharge instructions as well. Patient and brother to ride [employer] bus home at 2 pm.

## 2019-01-04 NOTE — Discharge Instructions (Signed)

## 2019-01-04 NOTE — Anesthesia Preprocedure Evaluation (Signed)
Anesthesia Evaluation  Patient identified by MRN, date of birth, ID band Patient awake    Reviewed: Allergy & Precautions, NPO status , Patient's Chart, lab work & pertinent test results, reviewed documented beta blocker date and time   History of Anesthesia Complications Negative for: history of anesthetic complications  Airway Mallampati: I  TM Distance: >3 FB Neck ROM: Full    Dental  (+) Chipped, Missing, Dental Advisory Given   Pulmonary  H/o trach post aortic dissection 01/01/2019 SARS coronavirus neg    breath sounds clear to auscultation       Cardiovascular hypertension, Pt. on medications and Pt. on home beta blockers + Peripheral Vascular Disease  CABG: s/p aortic dissection.  + dysrhythmias Atrial Fibrillation  Rhythm:Irregular Rate:Normal  05/2018 EF 60-65%, valves OK   Neuro/Psych  Headaches,    GI/Hepatic negative GI ROS, Neg liver ROS,   Endo/Other  negative endocrine ROS  Renal/GU ESRF and DialysisRenal disease (TuThSa, K+ 3.5)     Musculoskeletal  (+) Arthritis ,   Abdominal   Peds  Hematology Coumadin: INR 6.3   Anesthesia Other Findings   Reproductive/Obstetrics                             Anesthesia Physical Anesthesia Plan  ASA: III  Anesthesia Plan: General   Post-op Pain Management:    Induction: Intravenous  PONV Risk Score and Plan: 2 and Treatment may vary due to age or medical condition  Airway Management Planned: Natural Airway and Mask  Additional Equipment:   Intra-op Plan:   Post-operative Plan:   Informed Consent: I have reviewed the patients History and Physical, chart, labs and discussed the procedure including the risks, benefits and alternatives for the proposed anesthesia with the patient or authorized representative who has indicated his/her understanding and acceptance.     Dental advisory given  Plan Discussed with: CRNA and  Surgeon  Anesthesia Plan Comments:         Anesthesia Quick Evaluation

## 2019-01-04 NOTE — H&P (Signed)
ATRIAL FIB OFFICE VISIT   Go to Cards  12/26/2018  [facility]            [place], [place], [place]   Cardiology        Persistent atrial fibrillation   Dx        Referred by Clent Demark, PA-C   Reason for Visit        Additional Documentation   Vitals:       BP 110/80       Pulse 99    Flowsheets:        NEWS,       MEWS Score,       Method of Visit     Encounter Info:        Billing Info,       History,       Allergies,       Detailed Report            All Notes      Progress Notes by Oliver Barre, PA at 12/26/2018 2:00 PM   Author: Oliver Barre, PA Author Type: Physician Assistant Filed: 12/26/2018  2:49 PM  Note Status: Signed Cosign: Cosign Not Required Date of Service: 12/26/2018  2:00 PM  Editor: Oliver Barre, PA (Physician Assistant)                untitled image        Primary Care Physician: Clent Demark, PA-C  Primary Cardiologist: Dr [name]  Primary Electrophysiologist: none  Referring Physician: Kerin Ransom PA-C        Corey [name] is a 48 y.o. male with a history of aortic dissection s/p repeair 2004, ESRD, HTN, persistent atrial fibrillation and atrial flutter who presents for consultation in the [facility].  The patient was initially diagnosed with atrial fibrillation in 2014 in [place]. He was recently admitted after presenting to the ER with heart racing and found to be in afib with RVR. He was started on amiodarone and converted to SR. He has done well on amiodarone with much less heart racing until two weeks ago. His BP machine at home has shown heart rates 100-115 bpm. He denies chest pain, SOB, or dizziness. He does reports that around the time of his afib onset, he got news that two of his brothers were hospitalized with COVID-19 and one was in ICU on a ventilator. He  denies snoring or significant alcohol use.      On follow up today, patient reports that he continues to have weakness and fatigue. He is in atrial flutter today. He reports that the metoprolol has helped only minimally. Plans for parathyroidectomy noted. Epic phone note reads "per Dr. Tera Helper, the patient's surgery is not urgent. He would like for patient's afib to be dealt with first."     Today, he denies symptoms of chest pain, shortness of breath, orthopnea, PND, lower extremity edema, dizziness, presyncope, syncope, snoring, daytime somnolence, bleeding, or neurologic sequela. The patient is tolerating medications without difficulties and is otherwise without complaint today.         Atrial Fibrillation Risk Factors:     he does not have symptoms or diagnosis of sleep apnea.  he does not have a history of rheumatic fever.  he does not have a history of alcohol use.  The patient does have a history of early familial atrial fibrillation or other arrhythmias. Brother has  afib.     he has a BMI of There is no height or weight on file to calculate BMI..  There were no vitals filed for this visit.           Family History    Problem   Relation   Age of Onset    .   Cancer   Mother        .   Hypertension   Mother        .   Cancer   Father        .   Hypertension   Father        .   Thyroid disease   Sister        .   Stroke   Brother                11            Atrial Fibrillation Management history:     Previous antiarrhythmic drugs: amiodarone  Previous cardioversions: 2014  Previous ablations: none  CHADS2VASC score: 2  Anticoagulation history: Warfarin              Past Medical History:    Diagnosis   Date    .   Arthritis            hands and shoulders    .   Blindness and low vision            "Stargardt disease"    .   Dissection of aorta ([place])    2004        a. s/p extensive repeair in [id number] in [place] complicated by ESRD, lower extremity paralysis, coma, and extended hospitalization of 2 years    .   ESRD (end stage renal disease) ([address])            a. TTS    .   Headache        .   History of cardioversion   2014    .   Hypertension        .   Neuropathy        .   Non-healing non-surgical wound   03/2016    .   PAF (paroxysmal atrial fibrillation) ([place])            a. s/p DCCV in 2014; b. on Coumadin; c. CHADS2VASc => 2 (HTN, vascular disease)    .   Paralysis ([place])            due to dissection of aorta in 2004, lower extremities    .   Pneumonia                Past Surgical History:    Procedure   Laterality   Date    .   APPLICATION OF WOUND VAC   Left   04/13/2016        Procedure: APPLICATION OF WOUND VAC;  Surgeon: Conrad Monett, MD;  Location: [facility];  Service: Vascular;  Laterality: Left;    .   APPLICATION OF WOUND VAC   Left   04/18/2016        Procedure: APPLICATION OF WOUND VAC;  Surgeon: Waynetta Sandy, MD;  Location: [facility];  Service: Vascular;  Laterality: Left;  Wound vac change     .   APPLICATION OF WOUND VAC   Left   04/20/2016        Procedure: WOUND VAC  CHANGE;  Surgeon: Conrad Hillsboro, MD;  Location: [facility];  Service: Vascular;  Laterality: Left;    .   AV FISTULA PLACEMENT            .   BASCILIC VEIN TRANSPOSITION   Left   12/09/2015        Procedure: FIRST STAGE BASILIC VEIN TRANSPOSITION LEFT UPPER ARM;  Surgeon: Conrad Dresden, MD;  Location: [facility];  Service: Vascular;  Laterality: Left;    .   BASCILIC VEIN TRANSPOSITION   Left   03/09/2016        Procedure: SECOND STAGE BASILIC VEIN TRANSPOSITION WITH REVISION OF ANASTOMOSIS LEFT UPPER ARM;  Surgeon: Conrad [place], MD;  Location: [facility];  Service: Vascular;  Laterality: Left;    .   CARDIOVERSION             .   REPAIR OF ACUTE ASCENDING THORACIC AORTIC DISSECTION            .   REVISON OF ARTERIOVENOUS FISTULA   Left   04/20/2016        Procedure: LIGATION OF BASILIC VEIN TRANSPOSITION;  Surgeon: Conrad [place] Level, MD;  Location: [facility];  Service: Vascular;  Laterality: Left;    .   WOUND DEBRIDEMENT   Left   04/13/2016        Procedure: DEBRIDEMENT WOUND;  Surgeon: Conrad [place], MD;  Location: [facility];  Service: Vascular;  Laterality: Left;                Current Outpatient Medications    Medication   Sig   Dispense   Refill    .   amiodarone (PACERONE) 200 MG tablet   Take 1 tablet (200 mg total) by mouth daily.   90 tablet   3    .   B Complex-C-Folic Acid (RENAL-VITE) 0.8 MG TABS   Take by mouth.            .   cinacalcet (SENSIPAR) 90 MG tablet   TAKE 1 TABLET BY MOUTH EVERY DAY WITH DINNER AS DIRECTED            .   metoprolol tartrate (LOPRESSOR) 25 MG tablet   Take 0.5 tablets (12.5 mg total) by mouth 2 (two) times daily. Hold the AM of dialysis days   30 tablet   3    .   Multiple Vitamins-Minerals (MULTIVITAMIN WITH MINERALS) tablet   Take 1 tablet by mouth daily.            .   sevelamer carbonate (RENVELA) 0.8 g PACK packet   Take 1.6 g by mouth 3 (three) times daily with meals. (Patient taking differently: Take 0.8-1.6 g by mouth See admin instructions. Take 1.6g with meals and 0.8g with snacks)   270 each   0    .   warfarin (COUMADIN) 5 MG tablet   Take as directed by coumadin clinic   50 tablet   0        No current facility-administered medications for this encounter.                Allergies    Allergen   Reactions    .   Ciprofloxacin   Other (See Comments)            Aortic dissection    .   Heparin   Other (See Comments)  UNSPECIFIED REACTION :  On Coumadin since 2004        HIT panel negative 01/19/17    .    Quinolones              Social History             Socioeconomic History    .   Marital status:   Divorced            Spouse name:   Not on file    .   Number of children:   2    .   Years of education:   Not on file    .   Highest education level:   Not on file    Occupational History    .   Occupation:   DIABLED    Social Needs    .   Financial resource strain:   Not on file    .   Food insecurity            Worry:   Not on file            Inability:   Not on file    .   Transportation needs            Medical:   Not on file            Non-medical:   Not on file    Tobacco Use    .   Smoking status:   Never Smoker    .   Smokeless tobacco:   Never Used    Substance and Sexual Activity    .   Alcohol use:   No    .   Drug use:   No    .   Sexual activity:   Not on file    Lifestyle    .   Physical activity            Days per week:   Not on file            Minutes per session:   Not on file    .   Stress:   Not on file    Relationships    .   Social Airline pilot on phone:   Not on file            Gets together:   Not on file            Attends religious service:   Not on file            Active member of club or organization:   Not on file            Attends meetings of clubs or organizations:   Not on file            Relationship status:   Not on file    .   Intimate partner violence            Fear of current or ex partner:   Not on file            Emotionally abused:   Not on file            Physically abused:   Not on file            Forced sexual activity:   Not on file    Other Topics   Concern    .  Not on file    Social History Narrative    .   Not on file             ROS- All systems are reviewed and negative except as per the HPI above.     Physical Exam:      Vitals:        12/26/18 1401    BP:   110/80    Pulse:   99         GEN- The patient is well appearing, alert and oriented x 3 today.    HEENT-head normocephalic, atraumatic, sclera clear, conjunctiva pink, hearing intact, trachea midline.  Lungs- Clear to ausculation bilaterally, normal work of breathing  Heart- irregular rate and rhythm, no murmurs, rubs or gallops   GI- soft, NT, ND, + BS  Extremities- no clubbing, cyanosis, or edema  MS- no significant deformity or atrophy  Skin- no rash or lesion  Psych- euthymic mood, full affect  Neuro- in wheelchair            Wt Readings from Last 3 Encounters:    11/19/18   80 kg    06/18/18   88.9 kg    06/06/18   88.9 kg         EKG today demonstrates atypical atrial flutter with variable conduction, HR 99, LAFB, QRS 102, QTc 523     Echo 05/20/18 demonstrated   1. The left ventricle has normal systolic function with an ejection fraction of 60-65%. The cavity size was normal. There is mildly increased left ventricular wall thickness. Left ventricular diastology could not be evaluated due to nondiagnostic   images. No evidence of left ventricular regional wall motion abnormalities.   2. The right ventricle has normal systolic function. The cavity was normal. There is no increase in right ventricular wall thickness. Right ventricular systolic pressure is moderately elevated with an estimated pressure of 42.8 mmHg.   3. The pericardial effusion is posterior to the left ventricle.   4. Trivial pericardial effusion.   5. The mitral valve is normal in structure.   6. The tricuspid valve is normal in structure.   7. The aortic valve is tricuspid Mild sclerosis of the aortic valve.   8. The pulmonic valve was normal in structure.   9. The inferior vena cava was dilated in size with  >50% respiratory variability.  10. No evidence of left ventricular regional wall motion abnormalities.  11. Right atrial pressure is estimated at 8 mmHg.        Epic records are reviewed at length today     Assessment and Plan:     1. Persistent atrial fibrillation/atrial flutter  Patient appears to be in persistent afib.   We discussed therapeutic options including DCCV, patient agreeable to proceed.   Continue Lopressor 12.5 mg BID for rate control. Pt will hold on HD mornings.  Continue warfarin with no interruption for one month post DCCV. Weekly INRs have been therapeutic.   Continue amiodarone 200 mg daily.  Bmet/CBC today     This patients CHA2DS2-VASc Score and unadjusted Ischemic Stroke Rate (% per year) is equal to 2.2 % stroke rate/year from a score of 2     Above score calculated as 1 point each if present [CHF, HTN, DM, Vascular=MI/PAD/Aortic Plaque, Age if 65-74, or Male]  Above score calculated as 2 points each if present [Age > 75, or Stroke/TIA/TE]        2. HTN  Stable, no  changes today.     3. Aortic dissection  S/p repair 2004, now with stable type B dissection.  Followed by Dr [name] and Dr Servando Snare.     4. ESRD  HD on T/Th/S.        Follow up with Dr [name] as scheduled.         [facility]  [place], [place] [address]  [phone]  12/26/2018  2:41 PM    For DCCV; INR therapeutic for > 3 months; no changes. [name]

## 2019-01-05 DIAGNOSIS — D509 Iron deficiency anemia, unspecified: Secondary | ICD-10-CM | POA: Diagnosis not present

## 2019-01-05 DIAGNOSIS — Z992 Dependence on renal dialysis: Secondary | ICD-10-CM | POA: Diagnosis not present

## 2019-01-05 DIAGNOSIS — N2581 Secondary hyperparathyroidism of renal origin: Secondary | ICD-10-CM | POA: Diagnosis not present

## 2019-01-05 DIAGNOSIS — N186 End stage renal disease: Secondary | ICD-10-CM | POA: Diagnosis not present

## 2019-01-05 DIAGNOSIS — D631 Anemia in chronic kidney disease: Secondary | ICD-10-CM | POA: Diagnosis not present

## 2019-01-07 ENCOUNTER — Encounter (HOSPITAL_COMMUNITY): Payer: Self-pay | Admitting: Cardiology

## 2019-01-10 DIAGNOSIS — N2581 Secondary hyperparathyroidism of renal origin: Secondary | ICD-10-CM | POA: Diagnosis not present

## 2019-01-10 DIAGNOSIS — D631 Anemia in chronic kidney disease: Secondary | ICD-10-CM | POA: Diagnosis not present

## 2019-01-10 DIAGNOSIS — D509 Iron deficiency anemia, unspecified: Secondary | ICD-10-CM | POA: Diagnosis not present

## 2019-01-10 DIAGNOSIS — Z992 Dependence on renal dialysis: Secondary | ICD-10-CM | POA: Diagnosis not present

## 2019-01-10 DIAGNOSIS — I482 Chronic atrial fibrillation, unspecified: Secondary | ICD-10-CM | POA: Diagnosis not present

## 2019-01-10 DIAGNOSIS — N186 End stage renal disease: Secondary | ICD-10-CM | POA: Diagnosis not present

## 2019-01-12 DIAGNOSIS — D509 Iron deficiency anemia, unspecified: Secondary | ICD-10-CM | POA: Diagnosis not present

## 2019-01-12 DIAGNOSIS — N186 End stage renal disease: Secondary | ICD-10-CM | POA: Diagnosis not present

## 2019-01-12 DIAGNOSIS — N2581 Secondary hyperparathyroidism of renal origin: Secondary | ICD-10-CM | POA: Diagnosis not present

## 2019-01-12 DIAGNOSIS — Z992 Dependence on renal dialysis: Secondary | ICD-10-CM | POA: Diagnosis not present

## 2019-01-12 DIAGNOSIS — D631 Anemia in chronic kidney disease: Secondary | ICD-10-CM | POA: Diagnosis not present

## 2019-01-14 ENCOUNTER — Other Ambulatory Visit: Payer: Self-pay

## 2019-01-14 ENCOUNTER — Encounter: Payer: Self-pay | Admitting: Cardiology

## 2019-01-14 ENCOUNTER — Ambulatory Visit (INDEPENDENT_AMBULATORY_CARE_PROVIDER_SITE_OTHER): Payer: Medicare Other | Admitting: *Deleted

## 2019-01-14 ENCOUNTER — Encounter: Payer: Self-pay | Admitting: *Deleted

## 2019-01-14 ENCOUNTER — Ambulatory Visit (INDEPENDENT_AMBULATORY_CARE_PROVIDER_SITE_OTHER): Payer: Medicare Other | Admitting: Cardiology

## 2019-01-14 VITALS — BP 114/70 | HR 66 | Ht 74.0 in | Wt 200.0 lb

## 2019-01-14 DIAGNOSIS — I716 Thoracoabdominal aortic aneurysm, without rupture, unspecified: Secondary | ICD-10-CM

## 2019-01-14 DIAGNOSIS — I4891 Unspecified atrial fibrillation: Secondary | ICD-10-CM

## 2019-01-14 DIAGNOSIS — I7101 Dissection of thoracic aorta: Secondary | ICD-10-CM

## 2019-01-14 DIAGNOSIS — Z5181 Encounter for therapeutic drug level monitoring: Secondary | ICD-10-CM

## 2019-01-14 DIAGNOSIS — I71019 Dissection of thoracic aorta, unspecified: Secondary | ICD-10-CM

## 2019-01-14 LAB — POCT INR: INR: 2.5 (ref 2.0–3.0)

## 2019-01-14 NOTE — Patient Instructions (Signed)
Description   Pt reports that he has only been taking 1 tablet daily. Continue taking 1 tablet daily. Recheck INR in 2 weeks. Please call Coumadin Clinic for any changes in medications or upcoming procedures. [phone].

## 2019-01-14 NOTE — Progress Notes (Signed)
Cardiology Office Note    Date:  01/14/2019   ID:  Corey [name], DOB 05-18-1970, MRN [id number]  PCP:  Clent Demark, PA-C  Cardiologist:   Candee Furbish, MD     History of Present Illness:  Corey [name] is a 48 y.o. male here for follow up of atrial fibrillation with end-stage renal disease on hemodialysis, type B stable aortic dissection with original aortic dissection in 2004 with extensive repair in [place] and also here for preoperative evaluation prior to thyroidectomy.  Repaired aorta (remembers having veins out of legs as well).  In 2014 had atrial fibrillation, remembers having cardioversion, heart rate was 140 beats at the time.   He describes his original aorta dissected quite vividly, remembers eating dinner, cleaning the bowl he states and then feeling an extensive excruciating pain in his back like a "pop ".    Prolonged hospitalization he states 2 years, 3 months in a coma. He was paralyzed from the waist down. Had a pneumothorax as well.  He had another hospitalization here in October 2018.  Minimally elevated troponin.  CT scan showed stable type B aortic dissection originating distal from left subclavian.    Post hospitalization in November 2018 he had a repeat CT scan which showed stable type B dissection.  Overall he was found to be in atrial fibrillation once again on 12/26/2018 and seen by the atrial fibrillation clinic.  He was set up for cardioversion which was successful on 01/04/2019 with amiodarone 200 mg a day.  He cannot stop his Coumadin for at least 4 weeks post cardioversion.  He has stopped the metoprolol.  Does not wish to take again.  His blood pressure has been excellent.  Does not feel any palpitations fevers chills nausea vomiting syncope.  Thyroidectomy planned with Dr. Harlow Asa.    Past Medical History:  Diagnosis Date   Arthritis    hands and shoulders   Blindness and low vision    "Stargardt disease"   Dissection of aorta ([place]) 2004     a. s/p extensive repeair in [id number] in [place] complicated by ESRD, lower extremity paralysis, coma, and extended hospitalization of 2 years   ESRD (end stage renal disease) ([place])    a. TTS   Headache    History of cardioversion 2014   Hypertension    Neuropathy    Non-healing non-surgical wound 03/2016   PAF (paroxysmal atrial fibrillation) ([place])    a. s/p DCCV in 2014; b. on Coumadin; c. CHADS2VASc => 2 (HTN, vascular disease)   Paralysis ([place])    due to dissection of aorta in 2004, lower extremities   Pneumonia     Past Surgical History:  Procedure Laterality Date   APPLICATION OF WOUND VAC Left 04/13/2016   Procedure: APPLICATION OF WOUND VAC;  Surgeon: Conrad Selma, MD;  Location: [facility];  Service: Vascular;  Laterality: Left;   APPLICATION OF WOUND VAC Left 04/18/2016   Procedure: APPLICATION OF WOUND VAC;  Surgeon: Waynetta Sandy, MD;  Location: [facility];  Service: Vascular;  Laterality: Left;  Wound vac change    APPLICATION OF WOUND VAC Left 04/20/2016   Procedure: WOUND VAC CHANGE;  Surgeon: Conrad Walden, MD;  Location: [facility];  Service: Vascular;  Laterality: Left;   AV FISTULA PLACEMENT     [place] Left 12/09/2015   Procedure: FIRST STAGE BASILIC VEIN TRANSPOSITION LEFT UPPER ARM;  Surgeon: Conrad [place], MD;  Location: [place] [facility];  Service:  Vascular;  Laterality: Left;   BASCILIC VEIN TRANSPOSITION Left 03/09/2016   Procedure: SECOND STAGE BASILIC VEIN TRANSPOSITION WITH REVISION OF ANASTOMOSIS LEFT UPPER ARM;  Surgeon: Conrad Massac, MD;  Location: [facility];  Service: Vascular;  Laterality: Left;   CARDIOVERSION     CARDIOVERSION N/A 01/04/2019   Procedure: CARDIOVERSION;  Surgeon: Lelon Perla, MD;  [facility];  Service: Cardiovascular;  Laterality: N/A;   REPAIR OF ACUTE ASCENDING THORACIC AORTIC DISSECTION     REVISON OF ARTERIOVENOUS FISTULA Left 04/20/2016   Procedure: LIGATION OF BASILIC VEIN TRANSPOSITION;  Surgeon: Conrad Seligman, MD;  Location: [facility] [facility];  Service: Vascular;  Laterality: Left;   WOUND DEBRIDEMENT Left 04/13/2016   Procedure: DEBRIDEMENT WOUND;  Surgeon: Conrad , MD;  Location: [facility] OR;  Service: Vascular;  Laterality: Left;    Current Medications: Outpatient Medications Prior to Visit  Medication Sig Dispense Refill   amiodarone (PACERONE) 200 MG tablet Take 1 tablet (200 mg total) by mouth daily. 90 tablet 3   B Complex-C-Folic Acid (RENAL-VITE) 0.8 MG TABS Take 1 tablet by mouth daily with supper.      cinacalcet (SENSIPAR) 90 MG tablet Take 90 mg by mouth daily with supper.      sevelamer carbonate (RENVELA) 0.8 g PACK packet Take 1.6 g by mouth 3 (three) times daily with meals. (Patient taking differently: Take 0.8-1.6 g by mouth See admin instructions. Take 2 packets (1.6 g) by mouth with each meal & take 1 packet (0.8 g) by mouth with snacks.) 270 each 0   warfarin (COUMADIN) 5 MG tablet Take as directed by coumadin clinic (Patient taking differently: Take 5-7.5 mg by mouth See admin instructions. Take 1 tablet (5 mg) by mouth on Tuesdays, Thursdays, & Saturdays. Take 1.5 tablets (7.5 mg) by mouth on Sundays, Mondays, Wednesdays, & Fridays.) 50 tablet 0   metoprolol tartrate (LOPRESSOR) 25 MG tablet Take 0.5 tablets (12.5 mg total) by mouth 2 (two) times daily. Hold the AM of dialysis days (Patient not taking: Reported on 01/14/2019) 30 tablet 3   No facility-administered medications prior to visit.      Allergies:   Ciprofloxacin, Heparin, and Quinolones   Social History   Socioeconomic History   Marital status: Divorced    Spouse name: Not on file   Number of children: 2   Years of education: Not on file   Highest education level: Not on file  Occupational History   Occupation: [employer] resource strain: Not on file   Food insecurity    Worry: Not on file    Inability: Not on file   Transportation needs    Medical: Not on file     Non-medical: Not on file  Tobacco Use   Smoking status: Never Smoker   Smokeless tobacco: Never Used  Substance and Sexual Activity   Alcohol use: No   Drug use: No   Sexual activity: Not on file  Lifestyle   Physical activity    Days per week: Not on file    Minutes per session: Not on file   Stress: Not on file  Relationships   Social connections    Talks on phone: Not on file    Gets together: Not on file    Attends religious service: Not on file    Active member of club or organization: Not on file    Attends meetings of clubs or organizations: Not on file  Relationship status: Not on file  Other Topics Concern   Not on file  Social History Narrative   Not on file     Family History:  The patient's family history includes Cancer in his father and mother; Hypertension in his father and mother; Stroke in his brother; Thyroid disease in his sister.   ROS:   Please see the history of present illness.    Review of Systems  All other systems reviewed and are negative.     PHYSICAL EXAM:   VS:  BP 114/70    Pulse 66    Ht 6\' 2"  (1.88 m)    Wt 200 lb (90.7 kg)    SpO2 98%    BMI 25.68 kg/m    GEN: Well nourished, well developed, in no acute distress  HEENT: normal  Neck: no JVD, carotid bruits, or masses Cardiac: RRR; no murmurs, rubs, or gallops,no edema  Respiratory:  clear to auscultation bilaterally, normal work of breathing GI: soft, nontender, nondistended, + BS MS: no deformity or atrophy  Skin: warm and dry, no rash Neuro:  Alert and Oriented x 3, Strength and sensation are intact Psych: euthymic mood, full affect    Wt Readings from Last 3 Encounters:  01/14/19 200 lb (90.7 kg)  01/04/19 174 lb 2.6 oz (79 kg)  11/19/18 176 lb 5.9 oz (80 kg)      Studies/Labs Reviewed:   EKG:   01/22/17-sinus rhythm 75 no other abnormalities.  Personally viewed-prior 12/02/15-sinus rhythm 73 bpm with no other abnormalities.  Recent Labs: 05/19/2018: TSH  1.088 05/21/2018: ALT 7; Magnesium 2.2 12/26/2018: BUN 38; Creatinine, Ser 9.24; Platelets 124 01/04/2019: Hemoglobin 11.6; Potassium 3.5; Sodium 140   Lipid Panel    Component Value Date/Time   CHOL 150 05/20/2018 0243   TRIG 148 05/20/2018 0243   HDL 27 (L) 05/20/2018 0243   CHOLHDL 5.6 05/20/2018 0243   VLDL 30 05/20/2018 0243   LDLCALC 93 05/20/2018 0243    Additional studies/ records that were reviewed today include:   Dialysis office notes reviewed. Lab work reviewed.  Office notes, hospital notes, prior CT scan and repeat CT scan reviewed Echo 05/20/18 demonstrated  1. The left ventricle has normal systolic function with an ejection fraction of 60-65%. The cavity size was normal. There is mildly increased left ventricular wall thickness. Left ventricular diastology could not be evaluated due to nondiagnostic  images. No evidence of left ventricular regional wall motion abnormalities. 2. The right ventricle has normal systolic function. The cavity was normal. There is no increase in right ventricular wall thickness. Right ventricular systolic pressure is moderately elevated with an estimated pressure of 42.8 mmHg. 3. The pericardial effusion is posterior to the left ventricle. 4. Trivial pericardial effusion. 5. The mitral valve is normal in structure. 6. The tricuspid valve is normal in structure. 7. The aortic valve is tricuspid Mild sclerosis of the aortic valve. 8. The pulmonic valve was normal in structure. 9. The inferior vena cava was dilated in size with >50% respiratory variability. 10. No evidence of left ventricular regional wall motion abnormalities. 11. Right atrial pressure is estimated at 8 mmHg.  ASSESSMENT:    1. Atrial fibrillation, unspecified type ([place])   2. Thoracoabdominal aortic aneurysm (TAAA) without rupture ([place])   3. Dissection of thoracic aorta ([facility])      PLAN:  In order of problems listed above:  Preoperative cardiac evaluation,  thyroidectomy Dr. Harlow Asa -He may proceed with thyroidectomy with moderate cardiac risk. -Since he  got cardioversion on 01/04/2019, he will need uninterrupted Coumadin for 4 weeks post cardioversion.  I would plan on scheduling the surgery after 02/04/2019 to be safe.  At that time, hold Coumadin for 5 days prior to surgery.  Resume postop when safe.  Paroxysmal atrial fibrillation  - Cardioversion 2014 and once again on 01/04/2019.  This is while on amiodarone.  - Currently normal sinus rhythm, has been maintaining since.  - Has been on Coumadin since 2004  - We will continue given his extensive history of vascular repair.  Echocardiogram reviewed as above.  Normal EF.  He is off of the metoprolol.  -Uncomfortable with him continuing with the amiodarone 200 mg a day at this time.  Lets keep this on while he is about to undergo surgery.  At some point, we can consider cessation in the future.   Chronic anticoagulation  -Having his anticoagulation monitored.  Currently controlled.  Aortic dissection  - 2004. Extensive repair. States that he was in the hospital for 2 years. Partial paralysis waist down. Renal failure.  - Father died of enlarged heart however he has no other family members with aortic dissection.  - He states he was cleaning his bowl after dinner and felt a pop in his back which was excruciating.  - He also states that they had to utilize veins in his leg as part of the repair, I wonder if he is had bypass to his coronary arteries as well or perhaps 2 other major vessels.   -Repeat CT scan after hospitalization in October and November, stable type B dissection originating from the left subclavian distally.  Followed-up with Dr. Darcey Nora as well.  He did write a brief note on him in the October hospitalization.  We have described to him medical management.  No Cipro.  No Valsalva type maneuvers.  I am fine with him undergoing physical therapy.  No excessive heavy lifting.  Essential  hypertension  -Doing well off of the metoprolol.  - During hemodialysis, generally monitoring his blood pressure closely. If necessary, agents can be added by nephrology.  End-stage renal disease -On hemodialysis Tuesday Thursday Saturday.  4 months with Mickel Baas.    Medication Adjustments/Labs and Tests Ordered: Current medicines are reviewed at length with the patient today.  Concerns regarding medicines are outlined above.  Medication changes, Labs and Tests ordered today are listed in the Patient Instructions below. Patient Instructions  Medication Instructions:  The current medical regimen is effective;  continue present plan and medications.  If you need a refill on your cardiac medications before your next appointment, please call your pharmacy.   Follow-Up: At [facility], you and your health needs are our priority.  As part of our continuing mission to provide you with exceptional heart care, we have created designated Provider Care Teams.  These Care Teams include your primary Cardiologist (physician) and Advanced Practice Providers (APPs -  Physician Assistants and Nurse Practitioners) who all work together to provide you with the care you need, when you need it. You will need a follow up appointment in 4 months Cecilie Kicks, NP.  Please call our office 2 months in advance to schedule this appointment.  You may see Candee Furbish, MD or one of the following Advanced Practice Providers on your designated Care Team:   Truitt Merle, NP Cecilie Kicks, NP  Kathyrn Drown, NP  Thank you for choosing [facility]!!    \    Signed, Candee Furbish, MD  01/14/2019 5:08  PM    [facility] [place], [place], [place]  [address] Phone: [phone]; Fax: [phone]

## 2019-01-14 NOTE — Patient Instructions (Signed)
Medication Instructions:  The current medical regimen is effective;  continue present plan and medications.  If you need a refill on your cardiac medications before your next appointment, please call your pharmacy.   Follow-Up: At [facility], you and your health needs are our priority.  As part of our continuing mission to provide you with exceptional heart care, we have created designated Provider Care Teams.  These Care Teams include your primary Cardiologist (physician) and Advanced Practice Providers (APPs -  Physician Assistants and Nurse Practitioners) who all work together to provide you with the care you need, when you need it. You will need a follow up appointment in 4 months Cecilie Kicks, NP.  Please call our office 2 months in advance to schedule this appointment.  You may see Candee Furbish, MD or one of the following Advanced Practice Providers on your designated Care Team:   Truitt Merle, NP Cecilie Kicks, NP . Kathyrn Drown, NP  Thank you for choosing [facility]!!    \

## 2019-01-17 DIAGNOSIS — N186 End stage renal disease: Secondary | ICD-10-CM | POA: Diagnosis not present

## 2019-01-17 DIAGNOSIS — Z992 Dependence on renal dialysis: Secondary | ICD-10-CM | POA: Diagnosis not present

## 2019-01-17 DIAGNOSIS — N2581 Secondary hyperparathyroidism of renal origin: Secondary | ICD-10-CM | POA: Diagnosis not present

## 2019-01-17 DIAGNOSIS — D509 Iron deficiency anemia, unspecified: Secondary | ICD-10-CM | POA: Diagnosis not present

## 2019-01-17 DIAGNOSIS — D631 Anemia in chronic kidney disease: Secondary | ICD-10-CM | POA: Diagnosis not present

## 2019-01-19 DIAGNOSIS — N186 End stage renal disease: Secondary | ICD-10-CM | POA: Diagnosis not present

## 2019-01-19 DIAGNOSIS — N2581 Secondary hyperparathyroidism of renal origin: Secondary | ICD-10-CM | POA: Diagnosis not present

## 2019-01-19 DIAGNOSIS — Z992 Dependence on renal dialysis: Secondary | ICD-10-CM | POA: Diagnosis not present

## 2019-01-19 DIAGNOSIS — D509 Iron deficiency anemia, unspecified: Secondary | ICD-10-CM | POA: Diagnosis not present

## 2019-01-19 DIAGNOSIS — D631 Anemia in chronic kidney disease: Secondary | ICD-10-CM | POA: Diagnosis not present

## 2019-01-24 ENCOUNTER — Other Ambulatory Visit: Payer: Self-pay | Admitting: Cardiothoracic Surgery

## 2019-01-24 DIAGNOSIS — N186 End stage renal disease: Secondary | ICD-10-CM | POA: Diagnosis not present

## 2019-01-24 DIAGNOSIS — N2581 Secondary hyperparathyroidism of renal origin: Secondary | ICD-10-CM | POA: Diagnosis not present

## 2019-01-24 DIAGNOSIS — I71019 Dissection of thoracic aorta, unspecified: Secondary | ICD-10-CM

## 2019-01-24 DIAGNOSIS — I7101 Dissection of thoracic aorta: Secondary | ICD-10-CM

## 2019-01-24 DIAGNOSIS — Z992 Dependence on renal dialysis: Secondary | ICD-10-CM | POA: Diagnosis not present

## 2019-01-24 DIAGNOSIS — D509 Iron deficiency anemia, unspecified: Secondary | ICD-10-CM | POA: Diagnosis not present

## 2019-01-24 DIAGNOSIS — D631 Anemia in chronic kidney disease: Secondary | ICD-10-CM | POA: Diagnosis not present

## 2019-01-26 DIAGNOSIS — N2581 Secondary hyperparathyroidism of renal origin: Secondary | ICD-10-CM | POA: Diagnosis not present

## 2019-01-26 DIAGNOSIS — D631 Anemia in chronic kidney disease: Secondary | ICD-10-CM | POA: Diagnosis not present

## 2019-01-26 DIAGNOSIS — D509 Iron deficiency anemia, unspecified: Secondary | ICD-10-CM | POA: Diagnosis not present

## 2019-01-26 DIAGNOSIS — N186 End stage renal disease: Secondary | ICD-10-CM | POA: Diagnosis not present

## 2019-01-26 DIAGNOSIS — Z992 Dependence on renal dialysis: Secondary | ICD-10-CM | POA: Diagnosis not present

## 2019-01-29 DIAGNOSIS — D631 Anemia in chronic kidney disease: Secondary | ICD-10-CM | POA: Diagnosis not present

## 2019-01-29 DIAGNOSIS — N2581 Secondary hyperparathyroidism of renal origin: Secondary | ICD-10-CM | POA: Diagnosis not present

## 2019-01-29 DIAGNOSIS — N186 End stage renal disease: Secondary | ICD-10-CM | POA: Diagnosis not present

## 2019-01-29 DIAGNOSIS — D509 Iron deficiency anemia, unspecified: Secondary | ICD-10-CM | POA: Diagnosis not present

## 2019-01-29 DIAGNOSIS — Z992 Dependence on renal dialysis: Secondary | ICD-10-CM | POA: Diagnosis not present

## 2019-02-02 DIAGNOSIS — D509 Iron deficiency anemia, unspecified: Secondary | ICD-10-CM | POA: Diagnosis not present

## 2019-02-02 DIAGNOSIS — Z992 Dependence on renal dialysis: Secondary | ICD-10-CM | POA: Diagnosis not present

## 2019-02-02 DIAGNOSIS — D631 Anemia in chronic kidney disease: Secondary | ICD-10-CM | POA: Diagnosis not present

## 2019-02-02 DIAGNOSIS — N186 End stage renal disease: Secondary | ICD-10-CM | POA: Diagnosis not present

## 2019-02-02 DIAGNOSIS — N2581 Secondary hyperparathyroidism of renal origin: Secondary | ICD-10-CM | POA: Diagnosis not present

## 2019-02-03 DIAGNOSIS — N186 End stage renal disease: Secondary | ICD-10-CM | POA: Diagnosis not present

## 2019-02-03 DIAGNOSIS — I129 Hypertensive chronic kidney disease with stage 1 through stage 4 chronic kidney disease, or unspecified chronic kidney disease: Secondary | ICD-10-CM | POA: Diagnosis not present

## 2019-02-03 DIAGNOSIS — Z992 Dependence on renal dialysis: Secondary | ICD-10-CM | POA: Diagnosis not present

## 2019-02-05 DIAGNOSIS — D509 Iron deficiency anemia, unspecified: Secondary | ICD-10-CM | POA: Diagnosis not present

## 2019-02-05 DIAGNOSIS — Z992 Dependence on renal dialysis: Secondary | ICD-10-CM | POA: Diagnosis not present

## 2019-02-05 DIAGNOSIS — D631 Anemia in chronic kidney disease: Secondary | ICD-10-CM | POA: Diagnosis not present

## 2019-02-05 DIAGNOSIS — N2581 Secondary hyperparathyroidism of renal origin: Secondary | ICD-10-CM | POA: Diagnosis not present

## 2019-02-05 DIAGNOSIS — N186 End stage renal disease: Secondary | ICD-10-CM | POA: Diagnosis not present

## 2019-02-07 DIAGNOSIS — D509 Iron deficiency anemia, unspecified: Secondary | ICD-10-CM | POA: Diagnosis not present

## 2019-02-07 DIAGNOSIS — N186 End stage renal disease: Secondary | ICD-10-CM | POA: Diagnosis not present

## 2019-02-07 DIAGNOSIS — N2581 Secondary hyperparathyroidism of renal origin: Secondary | ICD-10-CM | POA: Diagnosis not present

## 2019-02-07 DIAGNOSIS — Z992 Dependence on renal dialysis: Secondary | ICD-10-CM | POA: Diagnosis not present

## 2019-02-07 DIAGNOSIS — D631 Anemia in chronic kidney disease: Secondary | ICD-10-CM | POA: Diagnosis not present

## 2019-02-07 DIAGNOSIS — I482 Chronic atrial fibrillation, unspecified: Secondary | ICD-10-CM | POA: Diagnosis not present

## 2019-02-07 LAB — PROTIME-INR: INR: 2 — AB (ref 0.9–1.1)

## 2019-02-09 DIAGNOSIS — Z992 Dependence on renal dialysis: Secondary | ICD-10-CM | POA: Diagnosis not present

## 2019-02-09 DIAGNOSIS — D509 Iron deficiency anemia, unspecified: Secondary | ICD-10-CM | POA: Diagnosis not present

## 2019-02-09 DIAGNOSIS — D631 Anemia in chronic kidney disease: Secondary | ICD-10-CM | POA: Diagnosis not present

## 2019-02-09 DIAGNOSIS — N186 End stage renal disease: Secondary | ICD-10-CM | POA: Diagnosis not present

## 2019-02-09 DIAGNOSIS — N2581 Secondary hyperparathyroidism of renal origin: Secondary | ICD-10-CM | POA: Diagnosis not present

## 2019-02-12 DIAGNOSIS — Z992 Dependence on renal dialysis: Secondary | ICD-10-CM | POA: Diagnosis not present

## 2019-02-12 DIAGNOSIS — N2581 Secondary hyperparathyroidism of renal origin: Secondary | ICD-10-CM | POA: Diagnosis not present

## 2019-02-12 DIAGNOSIS — N186 End stage renal disease: Secondary | ICD-10-CM | POA: Diagnosis not present

## 2019-02-12 DIAGNOSIS — D631 Anemia in chronic kidney disease: Secondary | ICD-10-CM | POA: Diagnosis not present

## 2019-02-12 DIAGNOSIS — D509 Iron deficiency anemia, unspecified: Secondary | ICD-10-CM | POA: Diagnosis not present

## 2019-02-14 ENCOUNTER — Inpatient Hospital Stay: Admission: RE | Admit: 2019-02-14 | Payer: Medicare Other | Source: Ambulatory Visit

## 2019-02-14 ENCOUNTER — Ambulatory Visit: Payer: Medicare Other | Admitting: Cardiothoracic Surgery

## 2019-02-14 NOTE — Progress Notes (Deleted)
[address]       [place] [address]             [phone]                    Heaton A Balgobin [facility] Medical Record [id number] Date of Birth: February 01, 1971  Referring: Isaiah Serge, NP Primary Care: Clent Demark, PA-C Primary Cardiologist: Candee Furbish, MD  Chief Complaint:    No chief complaint on file.   History of Present Illness:    Corey [name] 48 y.o. male  Referred by cardiology after aortic dissection while living in [place] in [id number], complicated by paraplegia and severe neuropathy. Patient confined to wheel chair.  Comes in today with follow up ct of chest.      Current Activity/ Functional Status:  Patient is not independent with mobility/ambulation, transfers, ADL's, IADL's.   Zubrod Score: At the time of surgery this patient's most appropriate activity status/level should be described as: []     0    Normal activity, no symptoms []     1    Restricted in physical strenuous activity but ambulatory, able to do out light work []     2    Ambulatory and capable of self care, unable to do work activities, up and about               >50 % of waking hours                              [x]     3    Only limited self care, in bed greater than 50% of waking hours []     4    Completely disabled, no self care, confined to bed or chair []     5    Moribund   Past Medical History:  Diagnosis Date  . Arthritis    hands and shoulders  . Blindness and low vision    "Stargardt disease"  . Dissection of aorta ([place]) 2004   a. s/p extensive repeair in [id number] in [place] complicated by ESRD, lower extremity paralysis, coma, and extended hospitalization of 2 years  . ESRD (end stage renal disease) ([place])    a. TTS  . Headache   . History of cardioversion 2014  . Hypertension   . Neuropathy   . Non-healing non-surgical wound 03/2016  . PAF (paroxysmal atrial fibrillation) ([place])    a. s/p DCCV in 2014; b. on Coumadin; c. CHADS2VASc => 2 (HTN, vascular  disease)  . Paralysis ([place])    due to dissection of aorta in 2004, lower extremities  . Pneumonia     Past Surgical History:  Procedure Laterality Date  . APPLICATION OF WOUND VAC Left 04/13/2016   Procedure: APPLICATION OF WOUND VAC;  Surgeon: Conrad [place], MD;  Location: [place];  Service: Vascular;  Laterality: Left;  . APPLICATION OF WOUND VAC Left 04/18/2016   Procedure: APPLICATION OF WOUND VAC;  Surgeon: Waynetta Sandy, MD;  Location: [place];  Service: Vascular;  Laterality: Left;  Wound vac change   . APPLICATION OF WOUND VAC Left 04/20/2016   Procedure: WOUND VAC CHANGE;  Surgeon: Conrad Energy, MD;  Location: [place];  Service: Vascular;  Laterality: Left;  . AV FISTULA PLACEMENT    . BASCILIC VEIN TRANSPOSITION Left 12/09/2015   Procedure: FIRST STAGE BASILIC VEIN TRANSPOSITION LEFT UPPER ARM;  Surgeon: Conrad Peterman, MD;  Location: [place];  Service: Vascular;  Laterality: Left;  . BASCILIC VEIN TRANSPOSITION Left 03/09/2016   Procedure: SECOND STAGE BASILIC VEIN TRANSPOSITION WITH REVISION OF ANASTOMOSIS LEFT UPPER ARM;  Surgeon: Conrad G[place]ne, MD;  Location: [place];  Service: Vascular;  Laterality: Left;  . CARDIOVERSION    . CARDIOVERSION N/A 01/04/2019   Procedure: CARDIOVERSION;  Surgeon: Lelon Perla, MD;  Location: [facility] ENDOSCOPY;  Service: Cardiovascular;  Laterality: N/A;  . REPAIR OF ACUTE ASCENDING THORACIC AORTIC DISSECTION    . REVISON OF ARTERIOVENOUS FISTULA Left 04/20/2016   Procedure: LIGATION OF BASILIC VEIN TRANSPOSITION;  Surgeon: Conrad Jackpot, MD;  Location: [place];  Service: Vascular;  Laterality: Left;  . WOUND DEBRIDEMENT Left 04/13/2016   Procedure: DEBRIDEMENT WOUND;  Surgeon: Conrad Brenda, MD;  Location: [place];  Service: Vascular;  Laterality: Left;    Family History  Problem Relation Age of Onset  . Cancer Mother   . Hypertension Mother   . Cancer Father   . Hypertension Father   . Thyroid disease Sister   . Stroke Brother        66     Social  History   Tobacco Use  Smoking Status Never Smoker  Smokeless Tobacco Never Used    Social History   Substance and Sexual Activity  Alcohol Use No     Allergies  Allergen Reactions  . Ciprofloxacin Other (See Comments)    Aortic dissection  . Heparin Other (See Comments)    UNSPECIFIED REACTION :  On Coumadin since 2004   HIT panel negative 01/19/17  . Quinolones     Current Outpatient Medications  Medication Sig Dispense Refill  . amiodarone (PACERONE) 200 MG tablet Take 1 tablet (200 mg total) by mouth daily. 90 tablet 3  . B Complex-C-Folic Acid (RENAL-VITE) 0.8 MG TABS Take 1 tablet by mouth daily with supper.     . cinacalcet (SENSIPAR) 90 MG tablet Take 90 mg by mouth daily with supper.     . sevelamer carbonate (RENVELA) 0.8 g PACK packet Take 1.6 g by mouth 3 (three) times daily with meals. (Patient taking differently: Take 0.8-1.6 g by mouth See admin instructions. Take 2 packets (1.6 g) by mouth with each meal & take 1 packet (0.8 g) by mouth with snacks.) 270 each 0  . warfarin (COUMADIN) 5 MG tablet Take as directed by coumadin clinic (Patient taking differently: Take 5-7.5 mg by mouth See admin instructions. Take 1 tablet (5 mg) by mouth on Tuesdays, Thursdays, & Saturdays. Take 1.5 tablets (7.5 mg) by mouth on Sundays, Mondays, Wednesdays, & Fridays.) 50 tablet 0   No current facility-administered medications for this visit.     Pertinent items are noted in HPI.   Review of Systems:     Cardiac Review of Systems: [Y] = yes  or   [ N ] = no   Chest Pain [   ]  Resting SOB [  ] Exertional SOB  [ ]   Orthopnea [  ]   Pedal Edema [   ]    Palpitations [ ]  Syncope  [  ]   Presyncope [   ] n General Review of Systems: [Y] = yes [  ]=no Constitional: recent weight change [  ];  Wt loss over the last 3 months [   ] anorexia [  ]; fatigue [  ]; nausea [  ]; night sweats [  ]; fever [  ];  or chills [  ];           Eye : blurred vision [  ]; diplopia [   ]; vision  changes [  ];  Amaurosis fugax[  ]; Resp: cough [  ];  wheezing[  ];  hemoptysis[  ]; shortness of breath[  ]; paroxysmal nocturnal dyspnea[  ]; dyspnea on exertion[  ]; or orthopnea[  ];  GI:  gallstones[  ], vomiting[  ];  dysphagia[  ]; melena[  ];  hematochezia [  ]; heartburn[  ];   Hx of  Colonoscopy[  ]; GU: kidney stones [  ]; hematuria[  ];   dysuria [  ];  nocturia[  ];  history of     obstruction [  ]; urinary frequency [  ]             Skin: rash, swelling[  ];, hair loss[  ];  peripheral edema[  ];  or itching[  ]; Musculosketetal: myalgias[  ];  joint swelling[  ];  joint erythema[  ];  joint pain[  ];  back pain[  ];  Heme/Lymph: bruising[  ];  bleeding[  ];  anemia[  ];  Neuro: TIA[  ];  headaches[  ];  stroke[  ];  vertigo[  ];  seizures[  ];   paresthesias[ y ];  difficulty walking[ y ];  Psych:depression[  ]; anxiety[  ];  Endocrine: diabetes[  ];  thyroid dysfunction[  ];  Immunizations: Flu up to date [  ]; Pneumococcal up to date [  ];  Other:     PHYSICAL EXAMINATION: There were no vitals taken for this visit. {physical exam:21449} Lower extremities swollen Right IJ dialysis catheter In wheel chair with lower extremityweakness  Diagnostic Studies & Laboratory data:     Recent Radiology Findings:   No results found.   I have independently reviewed the above radiology studies  and reviewed the findings with the patient.   Recent Lab Findings: Lab Results  Component Value Date   WBC 6.2 12/26/2018   HGB 11.6 (L) 01/04/2019   HCT 34.0 (L) 01/04/2019   PLT 124 (L) 12/26/2018   GLUCOSE 68 (L) 01/04/2019   CHOL 150 05/20/2018   TRIG 148 05/20/2018   HDL 27 (L) 05/20/2018   LDLCALC 93 05/20/2018   ALT 7 05/21/2018   AST 8 (L) 05/21/2018   NA 140 01/04/2019   K 3.5 01/04/2019   CL 102 12/26/2018   CREATININE 9.24 (H) 12/26/2018   BUN 38 (H) 12/26/2018   CO2 23 12/26/2018   TSH 1.088 05/19/2018   INR 2.0 (A) 02/07/2019      Assessment / Plan:    Chronic Type B dissection first noted 2004, distal arch increased to 4.6 cm- continue to monitor size. Follow up ct 8 months     I  spent 60 minutes with  the patient face to face and greater then 50% of the time was spent in counseling and coordination of care.    Grace Isaac MD

## 2019-02-16 DIAGNOSIS — N186 End stage renal disease: Secondary | ICD-10-CM | POA: Diagnosis not present

## 2019-02-16 DIAGNOSIS — D509 Iron deficiency anemia, unspecified: Secondary | ICD-10-CM | POA: Diagnosis not present

## 2019-02-16 DIAGNOSIS — D631 Anemia in chronic kidney disease: Secondary | ICD-10-CM | POA: Diagnosis not present

## 2019-02-16 DIAGNOSIS — N2581 Secondary hyperparathyroidism of renal origin: Secondary | ICD-10-CM | POA: Diagnosis not present

## 2019-02-16 DIAGNOSIS — Z992 Dependence on renal dialysis: Secondary | ICD-10-CM | POA: Diagnosis not present

## 2019-02-21 DIAGNOSIS — D631 Anemia in chronic kidney disease: Secondary | ICD-10-CM | POA: Diagnosis not present

## 2019-02-21 DIAGNOSIS — N186 End stage renal disease: Secondary | ICD-10-CM | POA: Diagnosis not present

## 2019-02-21 DIAGNOSIS — N2581 Secondary hyperparathyroidism of renal origin: Secondary | ICD-10-CM | POA: Diagnosis not present

## 2019-02-21 DIAGNOSIS — D509 Iron deficiency anemia, unspecified: Secondary | ICD-10-CM | POA: Diagnosis not present

## 2019-02-21 DIAGNOSIS — Z992 Dependence on renal dialysis: Secondary | ICD-10-CM | POA: Diagnosis not present

## 2019-02-23 DIAGNOSIS — D509 Iron deficiency anemia, unspecified: Secondary | ICD-10-CM | POA: Diagnosis not present

## 2019-02-23 DIAGNOSIS — N2581 Secondary hyperparathyroidism of renal origin: Secondary | ICD-10-CM | POA: Diagnosis not present

## 2019-02-23 DIAGNOSIS — Z992 Dependence on renal dialysis: Secondary | ICD-10-CM | POA: Diagnosis not present

## 2019-02-23 DIAGNOSIS — N186 End stage renal disease: Secondary | ICD-10-CM | POA: Diagnosis not present

## 2019-02-23 DIAGNOSIS — D631 Anemia in chronic kidney disease: Secondary | ICD-10-CM | POA: Diagnosis not present

## 2019-02-27 DIAGNOSIS — D509 Iron deficiency anemia, unspecified: Secondary | ICD-10-CM | POA: Diagnosis not present

## 2019-02-27 DIAGNOSIS — Z992 Dependence on renal dialysis: Secondary | ICD-10-CM | POA: Diagnosis not present

## 2019-02-27 DIAGNOSIS — D631 Anemia in chronic kidney disease: Secondary | ICD-10-CM | POA: Diagnosis not present

## 2019-02-27 DIAGNOSIS — N2581 Secondary hyperparathyroidism of renal origin: Secondary | ICD-10-CM | POA: Diagnosis not present

## 2019-02-27 DIAGNOSIS — N186 End stage renal disease: Secondary | ICD-10-CM | POA: Diagnosis not present

## 2019-03-02 DIAGNOSIS — N186 End stage renal disease: Secondary | ICD-10-CM | POA: Diagnosis not present

## 2019-03-02 DIAGNOSIS — Z992 Dependence on renal dialysis: Secondary | ICD-10-CM | POA: Diagnosis not present

## 2019-03-02 DIAGNOSIS — D509 Iron deficiency anemia, unspecified: Secondary | ICD-10-CM | POA: Diagnosis not present

## 2019-03-02 DIAGNOSIS — D631 Anemia in chronic kidney disease: Secondary | ICD-10-CM | POA: Diagnosis not present

## 2019-03-02 DIAGNOSIS — N2581 Secondary hyperparathyroidism of renal origin: Secondary | ICD-10-CM | POA: Diagnosis not present

## 2019-03-06 NOTE — Progress Notes (Signed)
[address]       [place] [address]             [phone]                    Corey [name] [place] Medical Record [id number] Date of Birth: 1970/04/13  Referring: Pixie Casino, MD Primary Care: Clent Demark, PA-C Primary Cardiologist: Candee Furbish, MD  Chief Complaint:    Chief Complaint  Patient presents with  . Thoracic Aortic Dissection    8 month f/u with CT CHEST    History of Present Illness:    Corey [name] 48 y.o. male  Referred by cardiology after aortic dissection while living in [place] in [id number], complicated by paraplegia and severe neuropathy. Patient confined to wheel chair.  Comes in today with follow up ct of chest. Previous scan was done in March 2020    Current Activity/ Functional Status:  Patient is not independent with mobility/ambulation, transfers, ADL's, IADL's.   Zubrod Score: At the time of surgery this patient's most appropriate activity status/level should be described as: []     0    Normal activity, no symptoms []     1    Restricted in physical strenuous activity but ambulatory, able to do out light work []     2    Ambulatory and capable of self care, unable to do work activities, up and about               >50 % of waking hours                              [x]     3    Only limited self care, in bed greater than 50% of waking hours []     4    Completely disabled, no self care, confined to bed or chair []     5    Moribund   Past Medical History:  Diagnosis Date  . Arthritis    hands and shoulders  . Blindness and low vision    "Stargardt disease"  . Dissection of aorta ([place]) 2004   a. s/p extensive repeair in [id number] in [place] complicated by ESRD, lower extremity paralysis, coma, and extended hospitalization of 2 years  . ESRD (end stage renal disease) ([place])    a. TTS  . Headache   . History of cardioversion 2014  . Hypertension   . Neuropathy   . Non-healing non-surgical wound 03/2016  . PAF  (paroxysmal atrial fibrillation) ([place])    a. s/p DCCV in 2014; b. on Coumadin; c. CHADS2VASc => 2 (HTN, vascular disease)  . Paralysis ([place])    due to dissection of aorta in 2004, lower extremities  . Pneumonia     Past Surgical History:  Procedure Laterality Date  . APPLICATION OF WOUND VAC Left 04/13/2016   Procedure: APPLICATION OF WOUND VAC;  Surgeon: Conrad Somervell, MD;  Location: [facility];  Service: Vascular;  Laterality: Left;  . APPLICATION OF WOUND VAC Left 04/18/2016   Procedure: APPLICATION OF WOUND VAC;  Surgeon: Waynetta Sandy, MD;  Location: [facility];  Service: Vascular;  Laterality: Left;  Wound vac change   . APPLICATION OF WOUND VAC Left 04/20/2016   Procedure: WOUND VAC CHANGE;  Surgeon: Conrad Bucyrus, MD;  Location: [facility];  Service: Vascular;  Laterality: Left;  . AV FISTULA  PLACEMENT    . BASCILIC VEIN TRANSPOSITION Left 12/09/2015   Procedure: FIRST STAGE BASILIC VEIN TRANSPOSITION LEFT UPPER ARM;  Surgeon: Conrad Mapleton, MD;  Location: [facility];  Service: Vascular;  Laterality: Left;  . BASCILIC VEIN TRANSPOSITION Left 03/09/2016   Procedure: SECOND STAGE BASILIC VEIN TRANSPOSITION WITH REVISION OF ANASTOMOSIS LEFT UPPER ARM;  Surgeon: Conrad Clarksdale, MD;  Location: [facility];  Service: Vascular;  Laterality: Left;  . CARDIOVERSION    . CARDIOVERSION N/A 01/04/2019   Procedure: CARDIOVERSION;  Surgeon: Lelon Perla, MD;  Location: [facility] ENDOSCOPY;  Service: Cardiovascular;  Laterality: N/A;  . REPAIR OF ACUTE ASCENDING THORACIC AORTIC DISSECTION    . REVISON OF ARTERIOVENOUS FISTULA Left 04/20/2016   Procedure: LIGATION OF BASILIC VEIN TRANSPOSITION;  Surgeon: Conrad Graniteville, MD;  Location: [facility];  Service: Vascular;  Laterality: Left;  . WOUND DEBRIDEMENT Left 04/13/2016   Procedure: DEBRIDEMENT WOUND;  Surgeon: Conrad Aberdeen, MD;  Location: [facility];  Service: Vascular;  Laterality: Left;    Family History  Problem Relation Age of Onset  . Cancer Mother   . Hypertension Mother   .  Cancer Father   . Hypertension Father   . Thyroid disease Sister   . Stroke Brother        35     Social History   Tobacco Use  Smoking Status Never Smoker  Smokeless Tobacco Never Used    Social History   Substance and Sexual Activity  Alcohol Use No     Allergies  Allergen Reactions  . Ciprofloxacin Other (See Comments)    Aortic dissection  . Heparin Other (See Comments)    UNSPECIFIED REACTION :  On Coumadin since 2004   HIT panel negative 01/19/17  . Quinolones     Current Outpatient Medications  Medication Sig Dispense Refill  . amiodarone (PACERONE) 200 MG tablet Take 1 tablet (200 mg total) by mouth daily. 90 tablet 3  . B Complex-C-Folic Acid (RENAL-VITE) 0.8 MG TABS Take 1 tablet by mouth daily with supper.     . cinacalcet (SENSIPAR) 90 MG tablet Take 90 mg by mouth daily with supper.     . sevelamer carbonate (RENVELA) 0.8 g PACK packet Take 1.6 g by mouth 3 (three) times daily with meals. (Patient taking differently: Take 0.8-1.6 g by mouth See admin instructions. Take 2 packets (1.6 g) by mouth with each meal & take 1 packet (0.8 g) by mouth with snacks.) 270 each 0  . warfarin (COUMADIN) 5 MG tablet Take as directed by coumadin clinic (Patient taking differently: Take 5-7.5 mg by mouth See admin instructions. Take 1 tablet (5 mg) by mouth on Tuesdays, Thursdays, & Saturdays. Take 1.5 tablets (7.5 mg) by mouth on Sundays, Mondays, Wednesdays, & Fridays.) 50 tablet 0   No current facility-administered medications for this visit.     Pertinent items are noted in HPI.   Review of Systems:     Cardiac Review of Systems: [Y] = yes  or   [ N ] = no   Chest Pain [ n   ]  Resting SOB [ n  ] Exertional SOB  [n  ]  Orthopnea [  ]   Pedal Edema [ y  ]    Palpitations [ n ] Syncope  [  n]   Presyncope [n   ] n General Review of Systems: [Y] = yes [  ]=no Constitional: recent weight change [  ];  Wt loss over  the last 3 months [   ] anorexia [  ]; fatigue [  ];  nausea [  ]; night sweats [  ]; fever [  ]; or chills [  ];           Eye : blurred vision [  ]; diplopia [   ]; vision changes [  ];  Amaurosis fugax[  ]; Resp: cough [  ];  wheezing[  ];  hemoptysis[  ]; shortness of breath[  ]; paroxysmal nocturnal dyspnea[  ]; dyspnea on exertion[  ]; or orthopnea[  ];  GI:  gallstones[  ], vomiting[  ];  dysphagia[  ]; melena[  ];  hematochezia [  ]; heartburn[  ];   Hx of  Colonoscopy[  ]; GU: kidney stones [  ]; hematuria[  ];   dysuria [  ];  nocturia[  ];  history of     obstruction [  ]; urinary frequency [  ]             Skin: rash, swelling[  ];, hair loss[  ];  peripheral edema[  ];  or itching[  ]; Musculosketetal: myalgias[  ];  joint swelling[  ];  joint erythema[  ];  joint pain[  ];  back pain[  ];  Heme/Lymph: bruising[  ];  bleeding[  ];  anemia[  ];  Neuro: TIA[  ];  headaches[  ];  stroke[  ];  vertigo[  ];  seizures[  ];   paresthesias[ y ];  difficulty walking[ y ];  Psych:depression[  ]; anxiety[  ];  Endocrine: diabetes[  ];  thyroid dysfunction[  ];  Immunizations: Flu up to date [  ]; Pneumococcal up to date [  ];  Other:     PHYSICAL EXAMINATION: BP (!) 147/84 (BP Location: Left Arm, Patient Position: Sitting, Cuff Size: Normal)   Pulse 74   Temp 97.7 F (36.5 C)   Resp 16   Ht 6\' 2"  (1.88 m)   Wt 80 kg   SpO2 90% Comment: RA  BMI 22.64 kg/m  .General appearance: alert, cooperative, appears older than stated age and no distress Head: Normocephalic, without obvious abnormality, atraumatic Resp: clear to auscultation bilaterally Cardio: regular rate and rhythm, S1, S2 normal, no murmur, click, rub or gallop Extremities: Patient with moderate edema and poor lower extremity function chronically Lower extremities swollen Right IJ dialysis catheter In wheel chair with lower extremityweakness  Diagnostic Studies & Laboratory data:     Recent Radiology Findings:   Ct Angio Chest Aorta W/cm &/or Wo/cm  Result Date:  03/07/2019 CLINICAL DATA:  Thoracic aortic dissection. EXAM: CT ANGIOGRAPHY CHEST WITH CONTRAST TECHNIQUE: Multidetector CT imaging of the chest was performed using the standard protocol during bolus administration of intravenous contrast. Multiplanar CT image reconstructions and MIPs were obtained to evaluate the vascular anatomy. CONTRAST:  52mL ISOVUE-370 IOPAMIDOL (ISOVUE-370) INJECTION 76% COMPARISON:  June 13, 2018. FINDINGS: Cardiovascular: Stable type B thoracic aortic dissection is noted involving the transverse aortic arch and descending thoracic aorta. Great vessels are widely patent without significant stenosis. No aneurysmal dilatation of ascending thoracic aorta is noted. Grossly stable proximal descending thoracic aortic aneurysm is noted at 4.6 cm. Grossly stable 4.2 cm descending thoracic aortic aneurysm is noted. The dissection extends into the abdominal aorta. Mild cardiomegaly is noted. No pericardial effusion is noted. Mediastinum/Nodes: No enlarged mediastinal, hilar, or axillary lymph nodes. Thyroid gland, trachea, and esophagus demonstrate no significant findings. Lungs/Pleura: No pneumothorax or pleural effusion  is noted. Mild bibasilar subsegmental atelectasis or scarring is noted. Upper Abdomen: Multiple right renal cysts are again noted. Musculoskeletal: Sclerosis of visualized skeleton is noted consistent with renal osteodystrophy. Review of the MIP images confirms the above findings. IMPRESSION: Stable type B thoracic aortic dissection is noted as described above. Grossly stable proximal descending thoracic aneurysmal dilatation is noted at 4.6 cm, with grossly stable 4.2 cm descending thoracic aortic aneurysmal dilatation. Recommend semi-annual imaging followup by CTA or MRA and referral to cardiothoracic surgery if not already obtained. This recommendation follows 2010 ACCF/AHA/AATS/ACR/ASA/SCA/SCAI/SIR/STS/SVM Guidelines for the Diagnosis and Management of Patients With Thoracic  Aortic Disease. Circulation. 2010; 121JN:9224643. Aortic aneurysm NOS (ICD10-I71.9) Electronically Signed   By: Marijo Conception M.D.   On: 03/07/2019 13:56     I have independently reviewed the above radiology studies  and reviewed the findings with the patient.   Recent Lab Findings: Lab Results  Component Value Date   WBC 6.2 12/26/2018   HGB 11.6 (L) 01/04/2019   HCT 34.0 (L) 01/04/2019   PLT 124 (L) 12/26/2018   GLUCOSE 68 (L) 01/04/2019   CHOL 150 05/20/2018   TRIG 148 05/20/2018   HDL 27 (L) 05/20/2018   LDLCALC 93 05/20/2018   ALT 7 05/21/2018   AST 8 (L) 05/21/2018   NA 140 01/04/2019   K 3.5 01/04/2019   CL 102 12/26/2018   CREATININE 9.24 (H) 12/26/2018   BUN 38 (H) 12/26/2018   CO2 23 12/26/2018   TSH 1.088 05/19/2018   INR 2.0 (A) 02/07/2019      Assessment / Plan:   Chronic Type B dissection first noted 2004, distal arch increased to 4.6 cm- continue to monitor size. Follow up ct 8 months       Grace Isaac MD

## 2019-03-07 ENCOUNTER — Other Ambulatory Visit: Payer: Self-pay

## 2019-03-07 ENCOUNTER — Ambulatory Visit
Admission: RE | Admit: 2019-03-07 | Discharge: 2019-03-07 | Disposition: A | Discharge status: Home / Self Care | Payer: Medicare Other | Source: Ambulatory Visit | Attending: Cardiothoracic Surgery | Admitting: Cardiothoracic Surgery

## 2019-03-07 ENCOUNTER — Ambulatory Visit (INDEPENDENT_AMBULATORY_CARE_PROVIDER_SITE_OTHER): Payer: Self-pay | Admitting: Cardiothoracic Surgery

## 2019-03-07 ENCOUNTER — Encounter: Payer: Self-pay | Admitting: Cardiothoracic Surgery

## 2019-03-07 VITALS — BP 147/84 | HR 74 | Temp 97.7°F | Resp 16 | Ht 74.0 in | Wt 176.4 lb

## 2019-03-07 DIAGNOSIS — I71019 Dissection of thoracic aorta, unspecified: Secondary | ICD-10-CM

## 2019-03-07 DIAGNOSIS — I712 Thoracic aortic aneurysm, without rupture: Secondary | ICD-10-CM | POA: Diagnosis not present

## 2019-03-07 DIAGNOSIS — I7101 Dissection of thoracic aorta: Secondary | ICD-10-CM

## 2019-03-07 MED ORDER — IOPAMIDOL (ISOVUE-370) INJECTION 76%
75.0000 mL | Freq: Once | INTRAVENOUS | Status: AC | PRN
  Administered 2019-03-07: 75 mL via INTRAVENOUS

## 2019-03-09 DIAGNOSIS — I482 Chronic atrial fibrillation, unspecified: Secondary | ICD-10-CM | POA: Diagnosis not present

## 2019-04-05 DIAGNOSIS — Z992 Dependence on renal dialysis: Secondary | ICD-10-CM | POA: Diagnosis not present

## 2019-04-05 DIAGNOSIS — I129 Hypertensive chronic kidney disease with stage 1 through stage 4 chronic kidney disease, or unspecified chronic kidney disease: Secondary | ICD-10-CM | POA: Diagnosis not present

## 2019-04-05 DIAGNOSIS — N186 End stage renal disease: Secondary | ICD-10-CM | POA: Diagnosis not present

## 2019-04-07 DIAGNOSIS — D631 Anemia in chronic kidney disease: Secondary | ICD-10-CM | POA: Diagnosis not present

## 2019-04-07 DIAGNOSIS — D509 Iron deficiency anemia, unspecified: Secondary | ICD-10-CM | POA: Diagnosis not present

## 2019-04-07 DIAGNOSIS — N2581 Secondary hyperparathyroidism of renal origin: Secondary | ICD-10-CM | POA: Diagnosis not present

## 2019-04-07 DIAGNOSIS — Z992 Dependence on renal dialysis: Secondary | ICD-10-CM | POA: Diagnosis not present

## 2019-04-07 DIAGNOSIS — N186 End stage renal disease: Secondary | ICD-10-CM | POA: Diagnosis not present

## 2019-04-11 DIAGNOSIS — I482 Chronic atrial fibrillation, unspecified: Secondary | ICD-10-CM | POA: Diagnosis not present

## 2019-04-11 DIAGNOSIS — N186 End stage renal disease: Secondary | ICD-10-CM | POA: Diagnosis not present

## 2019-04-11 DIAGNOSIS — Z992 Dependence on renal dialysis: Secondary | ICD-10-CM | POA: Diagnosis not present

## 2019-04-11 DIAGNOSIS — D631 Anemia in chronic kidney disease: Secondary | ICD-10-CM | POA: Diagnosis not present

## 2019-04-11 DIAGNOSIS — D509 Iron deficiency anemia, unspecified: Secondary | ICD-10-CM | POA: Diagnosis not present

## 2019-04-11 DIAGNOSIS — N2581 Secondary hyperparathyroidism of renal origin: Secondary | ICD-10-CM | POA: Diagnosis not present

## 2019-04-15 ENCOUNTER — Telehealth: Payer: Self-pay | Admitting: *Deleted

## 2019-04-15 NOTE — Telephone Encounter (Signed)
Received an INR report from [facility] from pt's hemodialysis center. Pt has not been in to see [place] since 01/14/2019, which was the same day he had an appt with his Cardiologist. Called the pt and had to leave a message stating for the pt to call us back at [phone] as he needs an appt.

## 2019-04-16 DIAGNOSIS — N186 End stage renal disease: Secondary | ICD-10-CM | POA: Diagnosis not present

## 2019-04-16 DIAGNOSIS — Z992 Dependence on renal dialysis: Secondary | ICD-10-CM | POA: Diagnosis not present

## 2019-04-16 DIAGNOSIS — D509 Iron deficiency anemia, unspecified: Secondary | ICD-10-CM | POA: Diagnosis not present

## 2019-04-16 DIAGNOSIS — D631 Anemia in chronic kidney disease: Secondary | ICD-10-CM | POA: Diagnosis not present

## 2019-04-16 DIAGNOSIS — N2581 Secondary hyperparathyroidism of renal origin: Secondary | ICD-10-CM | POA: Diagnosis not present

## 2019-04-19 NOTE — Telephone Encounter (Signed)
Left a message for the pt to call back to schedule an appt. Will await.

## 2019-04-19 NOTE — Telephone Encounter (Signed)
Pt made an appt with the scheduler for 04/22/2019.

## 2019-04-20 DIAGNOSIS — D631 Anemia in chronic kidney disease: Secondary | ICD-10-CM | POA: Diagnosis not present

## 2019-04-20 DIAGNOSIS — D509 Iron deficiency anemia, unspecified: Secondary | ICD-10-CM | POA: Diagnosis not present

## 2019-04-20 DIAGNOSIS — N186 End stage renal disease: Secondary | ICD-10-CM | POA: Diagnosis not present

## 2019-04-20 DIAGNOSIS — N2581 Secondary hyperparathyroidism of renal origin: Secondary | ICD-10-CM | POA: Diagnosis not present

## 2019-04-20 DIAGNOSIS — Z992 Dependence on renal dialysis: Secondary | ICD-10-CM | POA: Diagnosis not present

## 2019-04-22 ENCOUNTER — Ambulatory Visit (INDEPENDENT_AMBULATORY_CARE_PROVIDER_SITE_OTHER): Payer: Medicare Other | Admitting: Pharmacist

## 2019-04-22 ENCOUNTER — Other Ambulatory Visit: Payer: Self-pay

## 2019-04-22 DIAGNOSIS — I4891 Unspecified atrial fibrillation: Secondary | ICD-10-CM | POA: Diagnosis not present

## 2019-04-22 DIAGNOSIS — Z7901 Long term (current) use of anticoagulants: Secondary | ICD-10-CM

## 2019-04-22 LAB — POCT INR: INR: 1.7 — AB (ref 2.0–3.0)

## 2019-04-22 MED ORDER — WARFARIN SODIUM 5 MG PO TABS: ORAL_TABLET | ORAL | 0 refills | Status: DC

## 2019-04-22 NOTE — Patient Instructions (Signed)
Description   Take 2 tablets today (2 of the 5 mg tablets) then continue taking 1 tablet daily. Recheck INR in 2 weeks. Please call Coumadin Clinic for any changes in medications or upcoming procedures. [phone].

## 2019-04-23 DIAGNOSIS — N186 End stage renal disease: Secondary | ICD-10-CM | POA: Diagnosis not present

## 2019-04-23 DIAGNOSIS — N2581 Secondary hyperparathyroidism of renal origin: Secondary | ICD-10-CM | POA: Diagnosis not present

## 2019-04-23 DIAGNOSIS — D631 Anemia in chronic kidney disease: Secondary | ICD-10-CM | POA: Diagnosis not present

## 2019-04-23 DIAGNOSIS — D509 Iron deficiency anemia, unspecified: Secondary | ICD-10-CM | POA: Diagnosis not present

## 2019-04-23 DIAGNOSIS — Z992 Dependence on renal dialysis: Secondary | ICD-10-CM | POA: Diagnosis not present

## 2019-04-27 DIAGNOSIS — N186 End stage renal disease: Secondary | ICD-10-CM | POA: Diagnosis not present

## 2019-04-27 DIAGNOSIS — D509 Iron deficiency anemia, unspecified: Secondary | ICD-10-CM | POA: Diagnosis not present

## 2019-04-27 DIAGNOSIS — D631 Anemia in chronic kidney disease: Secondary | ICD-10-CM | POA: Diagnosis not present

## 2019-04-27 DIAGNOSIS — N2581 Secondary hyperparathyroidism of renal origin: Secondary | ICD-10-CM | POA: Diagnosis not present

## 2019-04-27 DIAGNOSIS — Z992 Dependence on renal dialysis: Secondary | ICD-10-CM | POA: Diagnosis not present

## 2019-05-02 DIAGNOSIS — N2581 Secondary hyperparathyroidism of renal origin: Secondary | ICD-10-CM | POA: Diagnosis not present

## 2019-05-02 DIAGNOSIS — D509 Iron deficiency anemia, unspecified: Secondary | ICD-10-CM | POA: Diagnosis not present

## 2019-05-02 DIAGNOSIS — Z992 Dependence on renal dialysis: Secondary | ICD-10-CM | POA: Diagnosis not present

## 2019-05-02 DIAGNOSIS — N186 End stage renal disease: Secondary | ICD-10-CM | POA: Diagnosis not present

## 2019-05-02 DIAGNOSIS — D631 Anemia in chronic kidney disease: Secondary | ICD-10-CM | POA: Diagnosis not present

## 2019-05-06 DIAGNOSIS — I129 Hypertensive chronic kidney disease with stage 1 through stage 4 chronic kidney disease, or unspecified chronic kidney disease: Secondary | ICD-10-CM | POA: Diagnosis not present

## 2019-05-06 DIAGNOSIS — N186 End stage renal disease: Secondary | ICD-10-CM | POA: Diagnosis not present

## 2019-05-06 DIAGNOSIS — Z992 Dependence on renal dialysis: Secondary | ICD-10-CM | POA: Diagnosis not present

## 2019-05-07 DIAGNOSIS — N186 End stage renal disease: Secondary | ICD-10-CM | POA: Diagnosis not present

## 2019-05-07 DIAGNOSIS — N2581 Secondary hyperparathyroidism of renal origin: Secondary | ICD-10-CM | POA: Diagnosis not present

## 2019-05-07 DIAGNOSIS — D631 Anemia in chronic kidney disease: Secondary | ICD-10-CM | POA: Diagnosis not present

## 2019-05-07 DIAGNOSIS — D509 Iron deficiency anemia, unspecified: Secondary | ICD-10-CM | POA: Diagnosis not present

## 2019-05-07 DIAGNOSIS — Z992 Dependence on renal dialysis: Secondary | ICD-10-CM | POA: Diagnosis not present

## 2019-05-11 DIAGNOSIS — D509 Iron deficiency anemia, unspecified: Secondary | ICD-10-CM | POA: Diagnosis not present

## 2019-05-11 DIAGNOSIS — N186 End stage renal disease: Secondary | ICD-10-CM | POA: Diagnosis not present

## 2019-05-11 DIAGNOSIS — D631 Anemia in chronic kidney disease: Secondary | ICD-10-CM | POA: Diagnosis not present

## 2019-05-11 DIAGNOSIS — I482 Chronic atrial fibrillation, unspecified: Secondary | ICD-10-CM | POA: Diagnosis not present

## 2019-05-11 DIAGNOSIS — Z992 Dependence on renal dialysis: Secondary | ICD-10-CM | POA: Diagnosis not present

## 2019-05-11 DIAGNOSIS — N2581 Secondary hyperparathyroidism of renal origin: Secondary | ICD-10-CM | POA: Diagnosis not present

## 2019-05-16 DIAGNOSIS — D509 Iron deficiency anemia, unspecified: Secondary | ICD-10-CM | POA: Diagnosis not present

## 2019-05-16 DIAGNOSIS — Z992 Dependence on renal dialysis: Secondary | ICD-10-CM | POA: Diagnosis not present

## 2019-05-16 DIAGNOSIS — N186 End stage renal disease: Secondary | ICD-10-CM | POA: Diagnosis not present

## 2019-05-16 DIAGNOSIS — N2581 Secondary hyperparathyroidism of renal origin: Secondary | ICD-10-CM | POA: Diagnosis not present

## 2019-05-16 DIAGNOSIS — D631 Anemia in chronic kidney disease: Secondary | ICD-10-CM | POA: Diagnosis not present

## 2019-05-18 DIAGNOSIS — N2581 Secondary hyperparathyroidism of renal origin: Secondary | ICD-10-CM | POA: Diagnosis not present

## 2019-05-18 DIAGNOSIS — D509 Iron deficiency anemia, unspecified: Secondary | ICD-10-CM | POA: Diagnosis not present

## 2019-05-18 DIAGNOSIS — D631 Anemia in chronic kidney disease: Secondary | ICD-10-CM | POA: Diagnosis not present

## 2019-05-18 DIAGNOSIS — Z992 Dependence on renal dialysis: Secondary | ICD-10-CM | POA: Diagnosis not present

## 2019-05-18 DIAGNOSIS — N186 End stage renal disease: Secondary | ICD-10-CM | POA: Diagnosis not present

## 2019-05-20 ENCOUNTER — Telehealth: Payer: Self-pay

## 2019-05-20 NOTE — Telephone Encounter (Signed)
Received INR results today 05/20/19 from [facility] (HD [place]), INR was 1.77 on 05/11/19. This result is 83 days old, pt missed his last INR appt which was scheduled for 05/06/19.  Attempted to call pt and advise he is overdue for follow-up and needs an appt. LMOM TCB for appt.

## 2019-05-21 DIAGNOSIS — D631 Anemia in chronic kidney disease: Secondary | ICD-10-CM | POA: Diagnosis not present

## 2019-05-21 DIAGNOSIS — Z992 Dependence on renal dialysis: Secondary | ICD-10-CM | POA: Diagnosis not present

## 2019-05-21 DIAGNOSIS — D509 Iron deficiency anemia, unspecified: Secondary | ICD-10-CM | POA: Diagnosis not present

## 2019-05-21 DIAGNOSIS — N2581 Secondary hyperparathyroidism of renal origin: Secondary | ICD-10-CM | POA: Diagnosis not present

## 2019-05-21 DIAGNOSIS — N186 End stage renal disease: Secondary | ICD-10-CM | POA: Diagnosis not present

## 2019-05-23 NOTE — Telephone Encounter (Signed)
Called pt since he is overdue for INR appt. He apologized for not coming to appt and set an appt for next week on a non HD day at a time he states works good for him on 05/29/2019 at 130p. Educated and advised the pt on the importance of attending appointments and he verbalized understanding.

## 2019-05-25 DIAGNOSIS — N2581 Secondary hyperparathyroidism of renal origin: Secondary | ICD-10-CM | POA: Diagnosis not present

## 2019-05-25 DIAGNOSIS — D631 Anemia in chronic kidney disease: Secondary | ICD-10-CM | POA: Diagnosis not present

## 2019-05-25 DIAGNOSIS — N186 End stage renal disease: Secondary | ICD-10-CM | POA: Diagnosis not present

## 2019-05-25 DIAGNOSIS — Z992 Dependence on renal dialysis: Secondary | ICD-10-CM | POA: Diagnosis not present

## 2019-05-25 DIAGNOSIS — D509 Iron deficiency anemia, unspecified: Secondary | ICD-10-CM | POA: Diagnosis not present

## 2019-05-29 ENCOUNTER — Ambulatory Visit (INDEPENDENT_AMBULATORY_CARE_PROVIDER_SITE_OTHER): Payer: Medicare Other

## 2019-05-29 ENCOUNTER — Other Ambulatory Visit: Payer: Self-pay

## 2019-05-29 DIAGNOSIS — Z7901 Long term (current) use of anticoagulants: Secondary | ICD-10-CM | POA: Diagnosis not present

## 2019-05-29 DIAGNOSIS — I4891 Unspecified atrial fibrillation: Secondary | ICD-10-CM | POA: Diagnosis not present

## 2019-05-29 LAB — POCT INR: INR: 1.8 — AB (ref 2.0–3.0)

## 2019-05-29 NOTE — Patient Instructions (Signed)
Description   Take 1.5 tablets today, then resume same dosage 1 tablet daily. Recheck INR in 3 weeks. Please call Coumadin Clinic for any changes in medications or upcoming procedures. [phone].

## 2019-05-30 DIAGNOSIS — D509 Iron deficiency anemia, unspecified: Secondary | ICD-10-CM | POA: Diagnosis not present

## 2019-05-30 DIAGNOSIS — D631 Anemia in chronic kidney disease: Secondary | ICD-10-CM | POA: Diagnosis not present

## 2019-05-30 DIAGNOSIS — Z992 Dependence on renal dialysis: Secondary | ICD-10-CM | POA: Diagnosis not present

## 2019-05-30 DIAGNOSIS — N2581 Secondary hyperparathyroidism of renal origin: Secondary | ICD-10-CM | POA: Diagnosis not present

## 2019-05-30 DIAGNOSIS — N186 End stage renal disease: Secondary | ICD-10-CM | POA: Diagnosis not present

## 2019-06-01 DIAGNOSIS — N186 End stage renal disease: Secondary | ICD-10-CM | POA: Diagnosis not present

## 2019-06-01 DIAGNOSIS — D509 Iron deficiency anemia, unspecified: Secondary | ICD-10-CM | POA: Diagnosis not present

## 2019-06-01 DIAGNOSIS — Z992 Dependence on renal dialysis: Secondary | ICD-10-CM | POA: Diagnosis not present

## 2019-06-01 DIAGNOSIS — N2581 Secondary hyperparathyroidism of renal origin: Secondary | ICD-10-CM | POA: Diagnosis not present

## 2019-06-01 DIAGNOSIS — D631 Anemia in chronic kidney disease: Secondary | ICD-10-CM | POA: Diagnosis not present

## 2019-06-03 DIAGNOSIS — Z992 Dependence on renal dialysis: Secondary | ICD-10-CM | POA: Diagnosis not present

## 2019-06-03 DIAGNOSIS — N186 End stage renal disease: Secondary | ICD-10-CM | POA: Diagnosis not present

## 2019-06-03 DIAGNOSIS — I129 Hypertensive chronic kidney disease with stage 1 through stage 4 chronic kidney disease, or unspecified chronic kidney disease: Secondary | ICD-10-CM | POA: Diagnosis not present

## 2019-06-06 DIAGNOSIS — N2581 Secondary hyperparathyroidism of renal origin: Secondary | ICD-10-CM | POA: Diagnosis not present

## 2019-06-06 DIAGNOSIS — I482 Chronic atrial fibrillation, unspecified: Secondary | ICD-10-CM | POA: Diagnosis not present

## 2019-06-06 DIAGNOSIS — Z992 Dependence on renal dialysis: Secondary | ICD-10-CM | POA: Diagnosis not present

## 2019-06-06 DIAGNOSIS — D509 Iron deficiency anemia, unspecified: Secondary | ICD-10-CM | POA: Diagnosis not present

## 2019-06-06 DIAGNOSIS — D631 Anemia in chronic kidney disease: Secondary | ICD-10-CM | POA: Diagnosis not present

## 2019-06-06 DIAGNOSIS — N186 End stage renal disease: Secondary | ICD-10-CM | POA: Diagnosis not present

## 2019-06-08 DIAGNOSIS — D509 Iron deficiency anemia, unspecified: Secondary | ICD-10-CM | POA: Diagnosis not present

## 2019-06-08 DIAGNOSIS — N186 End stage renal disease: Secondary | ICD-10-CM | POA: Diagnosis not present

## 2019-06-08 DIAGNOSIS — D631 Anemia in chronic kidney disease: Secondary | ICD-10-CM | POA: Diagnosis not present

## 2019-06-08 DIAGNOSIS — Z992 Dependence on renal dialysis: Secondary | ICD-10-CM | POA: Diagnosis not present

## 2019-06-08 DIAGNOSIS — N2581 Secondary hyperparathyroidism of renal origin: Secondary | ICD-10-CM | POA: Diagnosis not present

## 2019-06-13 DIAGNOSIS — N2581 Secondary hyperparathyroidism of renal origin: Secondary | ICD-10-CM | POA: Diagnosis not present

## 2019-06-13 DIAGNOSIS — N186 End stage renal disease: Secondary | ICD-10-CM | POA: Diagnosis not present

## 2019-06-13 DIAGNOSIS — Z992 Dependence on renal dialysis: Secondary | ICD-10-CM | POA: Diagnosis not present

## 2019-06-13 DIAGNOSIS — D631 Anemia in chronic kidney disease: Secondary | ICD-10-CM | POA: Diagnosis not present

## 2019-06-13 DIAGNOSIS — D509 Iron deficiency anemia, unspecified: Secondary | ICD-10-CM | POA: Diagnosis not present

## 2019-06-14 DIAGNOSIS — N186 End stage renal disease: Secondary | ICD-10-CM | POA: Diagnosis not present

## 2019-06-15 DIAGNOSIS — N2581 Secondary hyperparathyroidism of renal origin: Secondary | ICD-10-CM | POA: Diagnosis not present

## 2019-06-15 DIAGNOSIS — Z992 Dependence on renal dialysis: Secondary | ICD-10-CM | POA: Diagnosis not present

## 2019-06-15 DIAGNOSIS — D509 Iron deficiency anemia, unspecified: Secondary | ICD-10-CM | POA: Diagnosis not present

## 2019-06-15 DIAGNOSIS — D631 Anemia in chronic kidney disease: Secondary | ICD-10-CM | POA: Diagnosis not present

## 2019-06-15 DIAGNOSIS — N186 End stage renal disease: Secondary | ICD-10-CM | POA: Diagnosis not present

## 2019-06-20 DIAGNOSIS — N2581 Secondary hyperparathyroidism of renal origin: Secondary | ICD-10-CM | POA: Diagnosis not present

## 2019-06-20 DIAGNOSIS — N186 End stage renal disease: Secondary | ICD-10-CM | POA: Diagnosis not present

## 2019-06-20 DIAGNOSIS — D631 Anemia in chronic kidney disease: Secondary | ICD-10-CM | POA: Diagnosis not present

## 2019-06-20 DIAGNOSIS — D509 Iron deficiency anemia, unspecified: Secondary | ICD-10-CM | POA: Diagnosis not present

## 2019-06-20 DIAGNOSIS — Z992 Dependence on renal dialysis: Secondary | ICD-10-CM | POA: Diagnosis not present

## 2019-06-22 DIAGNOSIS — D631 Anemia in chronic kidney disease: Secondary | ICD-10-CM | POA: Diagnosis not present

## 2019-06-22 DIAGNOSIS — D509 Iron deficiency anemia, unspecified: Secondary | ICD-10-CM | POA: Diagnosis not present

## 2019-06-22 DIAGNOSIS — Z992 Dependence on renal dialysis: Secondary | ICD-10-CM | POA: Diagnosis not present

## 2019-06-22 DIAGNOSIS — N186 End stage renal disease: Secondary | ICD-10-CM | POA: Diagnosis not present

## 2019-06-22 DIAGNOSIS — N2581 Secondary hyperparathyroidism of renal origin: Secondary | ICD-10-CM | POA: Diagnosis not present

## 2019-06-25 DIAGNOSIS — Z992 Dependence on renal dialysis: Secondary | ICD-10-CM | POA: Diagnosis not present

## 2019-06-25 DIAGNOSIS — D631 Anemia in chronic kidney disease: Secondary | ICD-10-CM | POA: Diagnosis not present

## 2019-06-25 DIAGNOSIS — N2581 Secondary hyperparathyroidism of renal origin: Secondary | ICD-10-CM | POA: Diagnosis not present

## 2019-06-25 DIAGNOSIS — N186 End stage renal disease: Secondary | ICD-10-CM | POA: Diagnosis not present

## 2019-06-25 DIAGNOSIS — D509 Iron deficiency anemia, unspecified: Secondary | ICD-10-CM | POA: Diagnosis not present

## 2019-06-29 DIAGNOSIS — D509 Iron deficiency anemia, unspecified: Secondary | ICD-10-CM | POA: Diagnosis not present

## 2019-06-29 DIAGNOSIS — N186 End stage renal disease: Secondary | ICD-10-CM | POA: Diagnosis not present

## 2019-06-29 DIAGNOSIS — D631 Anemia in chronic kidney disease: Secondary | ICD-10-CM | POA: Diagnosis not present

## 2019-06-29 DIAGNOSIS — Z992 Dependence on renal dialysis: Secondary | ICD-10-CM | POA: Diagnosis not present

## 2019-06-29 DIAGNOSIS — N2581 Secondary hyperparathyroidism of renal origin: Secondary | ICD-10-CM | POA: Diagnosis not present

## 2019-07-04 DIAGNOSIS — Z992 Dependence on renal dialysis: Secondary | ICD-10-CM | POA: Diagnosis not present

## 2019-07-04 DIAGNOSIS — N2581 Secondary hyperparathyroidism of renal origin: Secondary | ICD-10-CM | POA: Diagnosis not present

## 2019-07-04 DIAGNOSIS — I129 Hypertensive chronic kidney disease with stage 1 through stage 4 chronic kidney disease, or unspecified chronic kidney disease: Secondary | ICD-10-CM | POA: Diagnosis not present

## 2019-07-04 DIAGNOSIS — N186 End stage renal disease: Secondary | ICD-10-CM | POA: Diagnosis not present

## 2019-07-06 DIAGNOSIS — N186 End stage renal disease: Secondary | ICD-10-CM | POA: Diagnosis not present

## 2019-07-06 DIAGNOSIS — Z992 Dependence on renal dialysis: Secondary | ICD-10-CM | POA: Diagnosis not present

## 2019-07-06 DIAGNOSIS — N2581 Secondary hyperparathyroidism of renal origin: Secondary | ICD-10-CM | POA: Diagnosis not present

## 2019-07-11 DIAGNOSIS — N2581 Secondary hyperparathyroidism of renal origin: Secondary | ICD-10-CM | POA: Diagnosis not present

## 2019-07-11 DIAGNOSIS — N186 End stage renal disease: Secondary | ICD-10-CM | POA: Diagnosis not present

## 2019-07-11 DIAGNOSIS — I482 Chronic atrial fibrillation, unspecified: Secondary | ICD-10-CM | POA: Diagnosis not present

## 2019-07-11 DIAGNOSIS — Z992 Dependence on renal dialysis: Secondary | ICD-10-CM | POA: Diagnosis not present

## 2019-07-13 DIAGNOSIS — N2581 Secondary hyperparathyroidism of renal origin: Secondary | ICD-10-CM | POA: Diagnosis not present

## 2019-07-13 DIAGNOSIS — Z992 Dependence on renal dialysis: Secondary | ICD-10-CM | POA: Diagnosis not present

## 2019-07-13 DIAGNOSIS — N186 End stage renal disease: Secondary | ICD-10-CM | POA: Diagnosis not present

## 2019-07-18 DIAGNOSIS — N2581 Secondary hyperparathyroidism of renal origin: Secondary | ICD-10-CM | POA: Diagnosis not present

## 2019-07-18 DIAGNOSIS — N186 End stage renal disease: Secondary | ICD-10-CM | POA: Diagnosis not present

## 2019-07-18 DIAGNOSIS — Z992 Dependence on renal dialysis: Secondary | ICD-10-CM | POA: Diagnosis not present

## 2019-07-20 DIAGNOSIS — N186 End stage renal disease: Secondary | ICD-10-CM | POA: Diagnosis not present

## 2019-07-20 DIAGNOSIS — Z992 Dependence on renal dialysis: Secondary | ICD-10-CM | POA: Diagnosis not present

## 2019-07-20 DIAGNOSIS — N2581 Secondary hyperparathyroidism of renal origin: Secondary | ICD-10-CM | POA: Diagnosis not present

## 2019-07-24 ENCOUNTER — Other Ambulatory Visit: Payer: Self-pay

## 2019-07-24 ENCOUNTER — Ambulatory Visit (INDEPENDENT_AMBULATORY_CARE_PROVIDER_SITE_OTHER): Payer: Medicare Other

## 2019-07-24 DIAGNOSIS — Z7901 Long term (current) use of anticoagulants: Secondary | ICD-10-CM | POA: Diagnosis not present

## 2019-07-24 DIAGNOSIS — I4891 Unspecified atrial fibrillation: Secondary | ICD-10-CM

## 2019-07-24 LAB — POCT INR: INR: 2.1 (ref 2.0–3.0)

## 2019-07-24 MED ORDER — WARFARIN SODIUM 5 MG PO TABS: ORAL_TABLET | ORAL | 0 refills | Status: DC

## 2019-07-24 NOTE — Patient Instructions (Signed)
Description   Continue on same dosage 1 tablet daily. Recheck INR in 4 weeks. Please call Coumadin Clinic for any changes in medications or upcoming procedures. [phone].

## 2019-07-25 DIAGNOSIS — N2581 Secondary hyperparathyroidism of renal origin: Secondary | ICD-10-CM | POA: Diagnosis not present

## 2019-07-25 DIAGNOSIS — N186 End stage renal disease: Secondary | ICD-10-CM | POA: Diagnosis not present

## 2019-07-25 DIAGNOSIS — Z992 Dependence on renal dialysis: Secondary | ICD-10-CM | POA: Diagnosis not present

## 2019-07-25 MED ORDER — AMIODARONE HCL 200 MG PO TABS: 200.0000 mg | ORAL_TABLET | Freq: Every day | ORAL | 1 refills | Status: DC

## 2019-07-27 DIAGNOSIS — Z992 Dependence on renal dialysis: Secondary | ICD-10-CM | POA: Diagnosis not present

## 2019-07-27 DIAGNOSIS — N2581 Secondary hyperparathyroidism of renal origin: Secondary | ICD-10-CM | POA: Diagnosis not present

## 2019-07-27 DIAGNOSIS — N186 End stage renal disease: Secondary | ICD-10-CM | POA: Diagnosis not present

## 2019-08-01 DIAGNOSIS — N186 End stage renal disease: Secondary | ICD-10-CM | POA: Diagnosis not present

## 2019-08-01 DIAGNOSIS — N2581 Secondary hyperparathyroidism of renal origin: Secondary | ICD-10-CM | POA: Diagnosis not present

## 2019-08-01 DIAGNOSIS — Z992 Dependence on renal dialysis: Secondary | ICD-10-CM | POA: Diagnosis not present

## 2019-08-03 DIAGNOSIS — N186 End stage renal disease: Secondary | ICD-10-CM | POA: Diagnosis not present

## 2019-08-03 DIAGNOSIS — N2581 Secondary hyperparathyroidism of renal origin: Secondary | ICD-10-CM | POA: Diagnosis not present

## 2019-08-03 DIAGNOSIS — I129 Hypertensive chronic kidney disease with stage 1 through stage 4 chronic kidney disease, or unspecified chronic kidney disease: Secondary | ICD-10-CM | POA: Diagnosis not present

## 2019-08-03 DIAGNOSIS — Z992 Dependence on renal dialysis: Secondary | ICD-10-CM | POA: Diagnosis not present

## 2019-08-08 DIAGNOSIS — N186 End stage renal disease: Secondary | ICD-10-CM | POA: Diagnosis not present

## 2019-08-08 DIAGNOSIS — N2581 Secondary hyperparathyroidism of renal origin: Secondary | ICD-10-CM | POA: Diagnosis not present

## 2019-08-08 DIAGNOSIS — I482 Chronic atrial fibrillation, unspecified: Secondary | ICD-10-CM | POA: Diagnosis not present

## 2019-08-08 DIAGNOSIS — Z992 Dependence on renal dialysis: Secondary | ICD-10-CM | POA: Diagnosis not present

## 2019-08-09 NOTE — Progress Notes (Addendum)
CARDIOLOGY OFFICE NOTE  Date:  08/12/2019    Corey [name] Date of Birth: September 20, 1970 Medical Record #[id number]  PCP:  Clent Demark, PA-C  Cardiologist:  [facility]  Chief Complaint  Patient presents with  . Follow-up    Seen for Dr. [name]    History of Present Illness: Corey [name] is a 49 y.o. male who presents today for a 7 month check. Seen for Dr. [name].   He has a history of PAF with prior cardioversion in 2014 and again in October 2020, on amiodarone, chronic anticoagulation, ESRD on HD, type B stable aortic dissection with original aortic dissection in 2004 with extensive repair in [place] and long hospitalization - now followed by EBG with last scan in 03/2019. Other issues include blindness and HTN.   Last seen back in October by Dr. [name] - needing clearance for thyroidectomy which was given. He had stopped his beta blocker. BP was good.   The patient does not have symptoms concerning for COVID-19 infection (fever, chills, cough, or new shortness of breath).   Comes in today. Here alone. Notes that he did not have his thyroidectomy - says "he guesses the levels were coming down". Then he says he has not had lab in some time. Notes his heart beat has been sporadic over the last week. He was going to call for this appointment and then our office reached out to him - ?recall. Dialysis is going ok - some low blood pressure noted. No syncope. Remains on coumadin - checked here. Remains off Metoprolol.Says that made his BP too low. He notes lack of appetite - he has considerable weight loss. Almost 40# since October. He has not seen PCP "in some time".  History is a little hard to follow.   Past Medical History:  Diagnosis Date  . Arthritis    hands and shoulders  . Blindness and low vision    "Stargardt disease"  . Dissection of aorta ([place]) 2004   a. s/p extensive repeair in 2703 in [place] complicated by ESRD, lower extremity paralysis, coma, and extended  hospitalization of 2 years  . ESRD (end stage renal disease) ([place])    a. TTS  . Headache   . History of cardioversion 2014  . Hypertension   . Neuropathy   . Non-healing non-surgical wound 03/2016  . PAF (paroxysmal atrial fibrillation) ([place])    a. s/p DCCV in 2014; b. on Coumadin; c. CHADS2VASc => 2 (HTN, vascular disease)  . Paralysis ([place])    due to dissection of aorta in 2004, lower extremities  . Pneumonia     Past Surgical History:  Procedure Laterality Date  . APPLICATION OF WOUND VAC Left 04/13/2016   Procedure: APPLICATION OF WOUND VAC;  Surgeon: Conrad Babcock, MD;  Location: [facility];  Service: Vascular;  Laterality: Left;  . APPLICATION OF WOUND VAC Left 04/18/2016   Procedure: APPLICATION OF WOUND VAC;  Surgeon: Waynetta Sandy, MD;  Location: [facility];  Service: Vascular;  Laterality: Left;  Wound vac change   . APPLICATION OF WOUND VAC Left 04/20/2016   Procedure: WOUND VAC CHANGE;  Surgeon: Conrad Clio, MD;  Location: [facility];  Service: Vascular;  Laterality: Left;  . AV FISTULA PLACEMENT    . BASCILIC VEIN TRANSPOSITION Left 12/09/2015   Procedure: FIRST STAGE BASILIC VEIN TRANSPOSITION LEFT UPPER ARM;  Surgeon: Conrad Stateline, MD;  Location: [facility];  Service: Vascular;  Laterality: Left;  . BASCILIC  VEIN TRANSPOSITION Left 03/09/2016   Procedure: SECOND STAGE BASILIC VEIN TRANSPOSITION WITH REVISION OF ANASTOMOSIS LEFT UPPER ARM;  Surgeon: Conrad Mellen, MD;  Location: [facility];  Service: Vascular;  Laterality: Left;  . CARDIOVERSION    . CARDIOVERSION N/A 01/04/2019   Procedure: CARDIOVERSION;  Surgeon: Lelon Perla, MD;  Location: [facility] ENDOSCOPY;  Service: Cardiovascular;  Laterality: N/A;  . REPAIR OF ACUTE ASCENDING THORACIC AORTIC DISSECTION    . REVISON OF ARTERIOVENOUS FISTULA Left 04/20/2016   Procedure: LIGATION OF BASILIC VEIN TRANSPOSITION;  Surgeon: Conrad [place], MD;  Location: [facility];  Service: Vascular;  Laterality: Left;  . WOUND DEBRIDEMENT Left 04/13/2016    Procedure: DEBRIDEMENT WOUND;  Surgeon: Conrad Rapides, MD;  Location[facility];  Service: Vascular;  Laterality: Left;     Medications: Current Meds  Medication Sig  . amiodarone (PACERONE) 200 MG tablet Take 1 tablet (200 mg total) by mouth daily.  . cinacalcet (SENSIPAR) 90 MG tablet Take 90 mg by mouth daily with supper.   . sevelamer carbonate (RENVELA) 0.8 g PACK packet Take 1.6 g by mouth 3 (three) times daily with meals.  . warfarin (COUMADIN) 5 MG tablet Take 1 tablet daily as directed by coumadin clinic     Allergies: Allergies  Allergen Reactions  . Ciprofloxacin Other (See Comments)    Aortic dissection  . Heparin Other (See Comments)    UNSPECIFIED REACTION :  On Coumadin since 2004   HIT panel negative 01/19/17  . Quinolones     Social History: The patient  reports that he has never smoked. He has never used smokeless tobacco. He reports that he does not drink alcohol or use drugs.   Family History: The patient's family history includes Cancer in his father and mother; Hypertension in his father and mother; Stroke in his brother; Thyroid disease in his sister.   Review of Systems: Please see the history of present illness.   All other systems are reviewed and negative.   Physical Exam: VS:  BP 114/70   Pulse (!) 103   Ht 6\' 2"  (1.88 m)   Wt 164 lb 3.9 oz (74.5 kg) Comment: per pt report, unable to stand  SpO2 94%   BMI 21.09 kg/m  .  BMI Body mass index is 21.09 kg/m.  Wt Readings from Last 3 Encounters:  08/12/19 164 lb 3.9 oz (74.5 kg)  03/07/19 176 lb 5.9 oz (80 kg)  01/14/19 200 lb (90.7 kg)    General: Alert and in no acute distress. He has lost weight considerably. Looks chronically ill. In a wheelchair.  HEENT: Normal. Eyes are bloodshot ? exophthalmus? Neck: Supple, no JVD, carotid bruits, or masses noted.  Cardiac: Irregular irregular rhythm. Rate is elevated. No murmurs, rubs, or gallops. No edema.  Respiratory:  Lungs are fairly clear to  auscultation bilaterally with normal work of breathing.  GI: Soft and nontender.  MS: No deformity or atrophy. Gait not tested.  Skin: Warm and dry. Color is normal.  Neuro:  Strength and sensation are intact and no gross focal deficits noted.  Psych: Alert, appropriate and with normal affect.   LABORATORY DATA:  EKG:  EKG is ordered today.  Personally reviewed by me. This demonstrates ? Slow atrial flutter - ?atrial tach - has increased VR - HR is 103 today. Reviewed with Dr. [name] here today as well.   Lab Results  Component Value Date   WBC 6.2 12/26/2018   HGB 11.6 (L) 01/04/2019  HCT 34.0 (L) 01/04/2019   PLT 124 (L) 12/26/2018   GLUCOSE 68 (L) 01/04/2019   CHOL 150 05/20/2018   TRIG 148 05/20/2018   HDL 27 (L) 05/20/2018   LDLCALC 93 05/20/2018   ALT 7 05/21/2018   AST 8 (L) 05/21/2018   NA 140 01/04/2019   K 3.5 01/04/2019   CL 102 12/26/2018   CREATININE 9.24 (H) 12/26/2018   BUN 38 (H) 12/26/2018   CO2 23 12/26/2018   TSH 1.088 05/19/2018   INR 2.1 07/24/2019   Lab Results  Component Value Date   INR 2.1 07/24/2019   INR 1.8 (A) 05/29/2019   INR 1.7 (A) 04/22/2019     BNP (last 3 results) No results for input(s): BNP in the last 8760 hours.  ProBNP (last 3 results) No results for input(s): PROBNP in the last 8760 hours.   Other Studies Reviewed Today:  CTA CHEST IMPRESSION 03/2019: Stable type B thoracic aortic dissection is noted as described above. Grossly stable proximal descending thoracic aneurysmal dilatation is noted at 4.6 cm, with grossly stable 4.2 cm descending thoracic aortic aneurysmal dilatation. Recommend semi-annual imaging followup by CTA or MRA and referral to cardiothoracic surgery if not already obtained. This recommendation follows 2010 ACCF/AHA/AATS/ACR/ASA/SCA/SCAI/SIR/STS/SVM Guidelines for the Diagnosis and Management of Patients With Thoracic Aortic Disease. Circulation. 2010; 121: L875-I433. Aortic aneurysm NOS  (ICD10-I71.9)   Electronically Signed   By: Marijo Conception M.D.   On: 03/07/2019 13:56  Echo 05/20/18 demonstrated  1. The left ventricle has normal systolic function with an ejection fraction of 60-65%. The cavity size was normal. There is mildly increased left ventricular wall thickness. Left ventricular diastology could not be evaluated due to nondiagnostic  images. No evidence of left ventricular regional wall motion abnormalities. 2. The right ventricle has normal systolic function. The cavity was normal. There is no increase in right ventricular wall thickness. Right ventricular systolic pressure is moderately elevated with an estimated pressure of 42.8 mmHg. 3. The pericardial effusion is posterior to the left ventricle. 4. Trivial pericardial effusion. 5. The mitral valve is normal in structure. 6. The tricuspid valve is normal in structure. 7. The aortic valve is tricuspid Mild sclerosis of the aortic valve. 8. The pulmonic valve was normal in structure. 9. The inferior vena cava was dilated in size with >50% respiratory variability. 10. No evidence of left ventricular regional wall motion abnormalities. 11. Right atrial pressure is estimated at 8 mmHg.  ASSESSMENT & PLAN:    1. PAF - HR is back up - may be in atrial flutter - hard to say - reviewed with Dr. [name] - will add back Lopressor 12.5 mg BID - hold on the AM days of dialysis - see back in 2 weeks with EKG - needs labs today - I suspect this is thyroid driven.   2. Thyroid disorder - did not have his thyroidectomy - unclear details - no recent TSH that I see. We will see if we can get some records from [place].   3. Marked weight loss - needs lab today. I suspect this is due to his thyroid disorder. We are checking lab today.    4. Prior type B aortic dissection - complicated by paraplegia and severe neuropathy - followed by EBG  5. Chronic anticoagulation - on Warfarin  6. ESRD - on HD - with  low BP as consequence - trying to add back low dose beta blocker today.   7. COVID-19 Education: The signs  and symptoms of COVID-19 were discussed with the patient and how to seek care for testing (follow up with PCP or arrange E-visit).  The importance of social distancing, staying at home, hand hygiene and wearing a mask when out in public were discussed today.  Current medicines are reviewed with the patient today.  The patient does not have concerns regarding medicines other than what has been noted above.  The following changes have been made:  See above.  Labs/ tests ordered today include:    Orders Placed This Encounter  Procedures  . TSH  . T3, free  . T4  . Basic metabolic panel  . CBC  . EKG 12-Lead     Disposition:   FU with [place] in about 2 to 3 weeks with repeat EKG. Lab today.     Patient is agreeable to this plan and will call if any problems develop in the interim.   SignedTruitt Merle, NP  08/12/2019 9:54 AM  [facility] [address] [place], [place]  [address] Phone: [phone] Fax: [phone]       Addendum: 08/14/19  Have received records from [facility] as well as [facility].   Unclear why he did not follow thru with parathyroidectomy. Ongoing theme of non compliance noted - missing sessions of dialysis, etc.   Thyroid levels were normal here.   Would keep follow up as planned. Repeat EKG on return. Overall his situation is tenuous at best.   Burtis Junes, RN, [facility] [address] [place], [place]  [address] [phone]

## 2019-08-10 DIAGNOSIS — N186 End stage renal disease: Secondary | ICD-10-CM | POA: Diagnosis not present

## 2019-08-10 DIAGNOSIS — Z992 Dependence on renal dialysis: Secondary | ICD-10-CM | POA: Diagnosis not present

## 2019-08-10 DIAGNOSIS — N2581 Secondary hyperparathyroidism of renal origin: Secondary | ICD-10-CM | POA: Diagnosis not present

## 2019-08-12 ENCOUNTER — Other Ambulatory Visit: Payer: Self-pay

## 2019-08-12 ENCOUNTER — Encounter: Payer: Self-pay | Admitting: Nurse Practitioner

## 2019-08-12 ENCOUNTER — Telehealth: Payer: Self-pay | Admitting: *Deleted

## 2019-08-12 ENCOUNTER — Ambulatory Visit (INDEPENDENT_AMBULATORY_CARE_PROVIDER_SITE_OTHER): Payer: Medicare Other | Admitting: Nurse Practitioner

## 2019-08-12 VITALS — BP 114/70 | HR 103 | Ht 74.0 in | Wt 164.2 lb

## 2019-08-12 DIAGNOSIS — Z79899 Other long term (current) drug therapy: Secondary | ICD-10-CM | POA: Diagnosis not present

## 2019-08-12 DIAGNOSIS — Z7189 Other specified counseling: Secondary | ICD-10-CM

## 2019-08-12 DIAGNOSIS — N186 End stage renal disease: Secondary | ICD-10-CM

## 2019-08-12 DIAGNOSIS — I4819 Other persistent atrial fibrillation: Secondary | ICD-10-CM

## 2019-08-12 DIAGNOSIS — I4891 Unspecified atrial fibrillation: Secondary | ICD-10-CM | POA: Diagnosis not present

## 2019-08-12 LAB — BASIC METABOLIC PANEL
BUN/Creatinine Ratio: 4 — ABNORMAL LOW (ref 9–20)
BUN: 35 mg/dL — ABNORMAL HIGH (ref 6–24)
CO2: 23 mmol/L (ref 20–29)
Calcium: 8.4 mg/dL — ABNORMAL LOW (ref 8.7–10.2)
Chloride: 97 mmol/L (ref 96–106)
Creatinine, Ser: 8.18 mg/dL — ABNORMAL HIGH (ref 0.76–1.27)
GFR calc Af Amer: 8 mL/min/{1.73_m2} — ABNORMAL LOW (ref >59)
GFR calc non Af Amer: 7 mL/min/{1.73_m2} — ABNORMAL LOW (ref >59)
Glucose: 95 mg/dL (ref 65–99)
Potassium: 4.2 mmol/L (ref 3.5–5.2)
Sodium: 141 mmol/L (ref 134–144)

## 2019-08-12 LAB — CBC
Hematocrit: 34 % — ABNORMAL LOW (ref 37.5–51.0)
Hemoglobin: 11.6 g/dL — ABNORMAL LOW (ref 13.0–17.7)
MCH: 30 pg (ref 26.6–33.0)
MCHC: 34.1 g/dL (ref 31.5–35.7)
MCV: 88 fL (ref 79–97)
Platelets: 143 10*3/uL — ABNORMAL LOW (ref 150–450)
RBC: 3.87 x10E6/uL — ABNORMAL LOW (ref 4.14–5.80)
RDW: 15.1 % (ref 11.6–15.4)
WBC: 9.3 10*3/uL (ref 3.4–10.8)

## 2019-08-12 LAB — TSH: TSH: 2.49 u[IU]/mL (ref 0.450–4.500)

## 2019-08-12 LAB — T3, FREE: T3, Free: 2 pg/mL (ref 2.0–4.4)

## 2019-08-12 LAB — T4: T4, Total: 8.1 ug/dL (ref 4.5–12.0)

## 2019-08-12 MED ORDER — METOPROLOL TARTRATE 25 MG PO TABS: 12.5000 mg | ORAL_TABLET | Freq: Two times a day (BID) | ORAL | 3 refills | Status: DC

## 2019-08-12 NOTE — Patient Instructions (Addendum)
After Visit Summary:  We will be checking the following labs today - BMET, CBC, TSH, free T3 and T4  Medication Instructions:    Continue with your current medicines. BUT  I am adding back Lopressor 25 mg to take just 1/2 tablet twice day - do not take AM dose on the days you go to dialysis   If you need a refill on your cardiac medications before your next appointment, please call your pharmacy.     Testing/Procedures To Be Arranged:  N/A  Follow-Up:   See Dr. [name] in about 2 weeks with repeat EKG    At [facility], you and your health needs are our priority.  As part of our continuing mission to provide you with exceptional heart care, we have created designated Provider Care Teams.  These Care Teams include your primary Cardiologist (physician) and Advanced Practice Providers (APPs -  Physician Assistants and Nurse Practitioners) who all work together to provide you with the care you need, when you need it.  Special Instructions:  . Stay safe, stay home, wash your hands for at least 20 seconds and wear a mask when out in public.  . It was good to talk with you today.    Call the [place] office at [phone] if you have any questions, problems or concerns.

## 2019-08-12 NOTE — Telephone Encounter (Signed)
LVM for Dr.Gerkins office at [place] @ [phone] to fax to [name] pt's last ov note as to why pt did not have surgery.  Will keep in box to F/U.

## 2019-08-13 ENCOUNTER — Telehealth: Payer: Self-pay | Admitting: *Deleted

## 2019-08-13 NOTE — Telephone Encounter (Signed)
S/w Vaughan Basta at [facility] @ [phone] to get pt's last ov note from Dr. Edrick Oh faxed,

## 2019-08-13 NOTE — Telephone Encounter (Signed)
S/w with Vaughan Basta at [place] will fax last ov note to office.

## 2019-08-13 NOTE — Telephone Encounter (Signed)
Received fax from [facility] from [place] [phone] and fax # [phone].

## 2019-08-14 ENCOUNTER — Telehealth: Payer: Self-pay | Admitting: *Deleted

## 2019-08-14 NOTE — Telephone Encounter (Signed)
Whitney from [facility] explained the original certificate per medicare has to be signed with Truitt Merle, NP and [place]  supervising physician, Dr. Neita Garnet.  After first copy is faxed all paperwork goes to [place] to sign. Medicare is cracking down on this. This certificate with Dr. Antionette Char name on it needs to be shredded.  Loree Fee is sending a new faxed copy.  Will send to [place] to [place].

## 2019-08-14 NOTE — Telephone Encounter (Signed)
This is Hospice orders for Corey [name]".   We will need to get this clarified.   [name]

## 2019-08-14 NOTE — Telephone Encounter (Signed)
This is an error s/w Whitney at [facility] and knew what pt I was referring to and sent to fax with right pt.

## 2019-08-14 NOTE — Telephone Encounter (Signed)
S/w Myra the receptionist to s/w someone to handle the fax that was received back stating, per Medicare guidelines;  we have to have the listed MD sign as well under [place] name, Dr. Burt Knack.  This is not Dr. Antionette Char pt and previously another fax was sent on this pt with just [place] name. The receptionist stated would send and email in regards to this and have someone get back to [place]. The number is [phone].

## 2019-08-14 NOTE — Telephone Encounter (Signed)
Placed two sets of records for upcoming appointment in medical records.

## 2019-08-15 DIAGNOSIS — N2581 Secondary hyperparathyroidism of renal origin: Secondary | ICD-10-CM | POA: Diagnosis not present

## 2019-08-15 DIAGNOSIS — Z992 Dependence on renal dialysis: Secondary | ICD-10-CM | POA: Diagnosis not present

## 2019-08-15 DIAGNOSIS — N186 End stage renal disease: Secondary | ICD-10-CM | POA: Diagnosis not present

## 2019-08-17 DIAGNOSIS — N2581 Secondary hyperparathyroidism of renal origin: Secondary | ICD-10-CM | POA: Diagnosis not present

## 2019-08-17 DIAGNOSIS — Z992 Dependence on renal dialysis: Secondary | ICD-10-CM | POA: Diagnosis not present

## 2019-08-17 DIAGNOSIS — N186 End stage renal disease: Secondary | ICD-10-CM | POA: Diagnosis not present

## 2019-08-22 DIAGNOSIS — N186 End stage renal disease: Secondary | ICD-10-CM | POA: Diagnosis not present

## 2019-08-22 DIAGNOSIS — Z992 Dependence on renal dialysis: Secondary | ICD-10-CM | POA: Diagnosis not present

## 2019-08-22 DIAGNOSIS — N2581 Secondary hyperparathyroidism of renal origin: Secondary | ICD-10-CM | POA: Diagnosis not present

## 2019-08-24 DIAGNOSIS — Z992 Dependence on renal dialysis: Secondary | ICD-10-CM | POA: Diagnosis not present

## 2019-08-24 DIAGNOSIS — N2581 Secondary hyperparathyroidism of renal origin: Secondary | ICD-10-CM | POA: Diagnosis not present

## 2019-08-24 DIAGNOSIS — N186 End stage renal disease: Secondary | ICD-10-CM | POA: Diagnosis not present

## 2019-08-29 ENCOUNTER — Ambulatory Visit: Payer: Medicare Other | Admitting: Cardiology

## 2019-08-29 DIAGNOSIS — Z992 Dependence on renal dialysis: Secondary | ICD-10-CM | POA: Diagnosis not present

## 2019-08-29 DIAGNOSIS — N2581 Secondary hyperparathyroidism of renal origin: Secondary | ICD-10-CM | POA: Diagnosis not present

## 2019-08-29 DIAGNOSIS — N186 End stage renal disease: Secondary | ICD-10-CM | POA: Diagnosis not present

## 2019-08-31 DIAGNOSIS — Z992 Dependence on renal dialysis: Secondary | ICD-10-CM | POA: Diagnosis not present

## 2019-08-31 DIAGNOSIS — N186 End stage renal disease: Secondary | ICD-10-CM | POA: Diagnosis not present

## 2019-08-31 DIAGNOSIS — N2581 Secondary hyperparathyroidism of renal origin: Secondary | ICD-10-CM | POA: Diagnosis not present

## 2019-09-04 ENCOUNTER — Encounter: Payer: Self-pay | Admitting: *Deleted

## 2019-09-04 ENCOUNTER — Ambulatory Visit (INDEPENDENT_AMBULATORY_CARE_PROVIDER_SITE_OTHER): Payer: Medicare Other | Admitting: Cardiology

## 2019-09-04 ENCOUNTER — Other Ambulatory Visit: Payer: Self-pay

## 2019-09-04 ENCOUNTER — Encounter: Payer: Self-pay | Admitting: Cardiology

## 2019-09-04 ENCOUNTER — Ambulatory Visit (INDEPENDENT_AMBULATORY_CARE_PROVIDER_SITE_OTHER): Payer: Medicare Other | Admitting: *Deleted

## 2019-09-04 VITALS — BP 110/70 | HR 93 | Ht 74.0 in | Wt 164.0 lb

## 2019-09-04 DIAGNOSIS — I4819 Other persistent atrial fibrillation: Secondary | ICD-10-CM | POA: Diagnosis not present

## 2019-09-04 DIAGNOSIS — Z01812 Encounter for preprocedural laboratory examination: Secondary | ICD-10-CM

## 2019-09-04 DIAGNOSIS — I4891 Unspecified atrial fibrillation: Secondary | ICD-10-CM | POA: Diagnosis not present

## 2019-09-04 DIAGNOSIS — I71019 Dissection of thoracic aorta, unspecified: Secondary | ICD-10-CM

## 2019-09-04 DIAGNOSIS — N186 End stage renal disease: Secondary | ICD-10-CM

## 2019-09-04 DIAGNOSIS — Z7901 Long term (current) use of anticoagulants: Secondary | ICD-10-CM | POA: Diagnosis not present

## 2019-09-04 DIAGNOSIS — I7101 Dissection of thoracic aorta: Secondary | ICD-10-CM | POA: Diagnosis not present

## 2019-09-04 LAB — BASIC METABOLIC PANEL
BUN/Creatinine Ratio: 7 — ABNORMAL LOW (ref 9–20)
BUN: 79 mg/dL (ref 6–24)
CO2: 24 mmol/L (ref 20–29)
Calcium: 8.7 mg/dL (ref 8.7–10.2)
Chloride: 101 mmol/L (ref 96–106)
Creatinine, Ser: 11.83 mg/dL — ABNORMAL HIGH (ref 0.76–1.27)
GFR calc Af Amer: 5 mL/min/{1.73_m2} — ABNORMAL LOW (ref >59)
GFR calc non Af Amer: 4 mL/min/{1.73_m2} — ABNORMAL LOW (ref >59)
Glucose: 83 mg/dL (ref 65–99)
Potassium: 4.5 mmol/L (ref 3.5–5.2)
Sodium: 140 mmol/L (ref 134–144)

## 2019-09-04 LAB — POCT INR: INR: 1.6 — AB (ref 2.0–3.0)

## 2019-09-04 LAB — PROTIME-INR
INR: 1.4 — ABNORMAL HIGH (ref 0.9–1.2)
Prothrombin Time: 14.7 s — ABNORMAL HIGH (ref 9.1–12.0)

## 2019-09-04 NOTE — Progress Notes (Signed)
Cardiology Office Note:    Date:  09/04/2019   ID:  Corey [name], DOB 31-Jul-1970, MRN [id number]  PCP:  Clent Demark, PA-C  Cardiologist:  Candee Furbish, MD  Electrophysiologist:  None   Referring MD: Clent Demark, PA-C     History of Present Illness:    Corey [name] is a 49 y.o. male here for follow up aortic type B dissection.   He has a history of PAF with prior cardioversion in 2014 and again in October 2020, on amiodarone, chronic anticoagulation, ESRD on HD, type B stable aortic dissection with original aortic dissection in 2004 with extensive repair in [place] and long hospitalization - now followed by EBG with last scan in 03/2019. Other issues include blindness and HTN.   He did not have his thyroidectomy - says "he guesses the levels were coming down".  He notes lack of appetite - he has considerable weight loss. Almost 40# since October. He has not seen PCP "in some time".    Toady here to repeat ECG.  Overall feels reasonably well.  Occasionally will feel his heart skip, sometimes may feel it rapid.  He denies any shortness of breath or chest pain.  Past Medical History:  Diagnosis Date  . Arthritis    hands and shoulders  . Blindness and low vision    "Stargardt disease"  . Dissection of aorta ([place]) 2004   a. s/p extensive repeair in 7846 in [place] complicated by ESRD, lower extremity paralysis, coma, and extended hospitalization of 2 years  . ESRD (end stage renal disease) ([place])    a. TTS  . Headache   . History of cardioversion 2014  . Hypertension   . Neuropathy   . Non-healing non-surgical wound 03/2016  . PAF (paroxysmal atrial fibrillation) ([place])    a. s/p DCCV in 2014; b. on Coumadin; c. CHADS2VASc => 2 (HTN, vascular disease)  . Paralysis ([place])    due to dissection of aorta in 2004, lower extremities  . Pneumonia     Past Surgical History:  Procedure Laterality Date  . APPLICATION OF WOUND VAC Left 04/13/2016   Procedure: APPLICATION OF WOUND  VAC;  Surgeon: Conrad Paoli, MD;  Location: [place];  Service: Vascular;  Laterality: Left;  . APPLICATION OF WOUND VAC Left 04/18/2016   Procedure: APPLICATION OF WOUND VAC;  Surgeon: Waynetta Sandy, MD;  Location: [place];  Service: Vascular;  Laterality: Left;  Wound vac change   . APPLICATION OF WOUND VAC Left 04/20/2016   Procedure: WOUND VAC CHANGE;  Surgeon: Conrad Gilbert, MD;  Location: [place];  Service: Vascular;  Laterality: Left;  . AV FISTULA PLACEMENT    . BASCILIC VEIN TRANSPOSITION Left 12/09/2015   Procedure: FIRST STAGE BASILIC VEIN TRANSPOSITION LEFT UPPER ARM;  Surgeon: Conrad Modoc, MD;  Location: [place];  Service: Vascular;  Laterality: Left;  . BASCILIC VEIN TRANSPOSITION Left 03/09/2016   Procedure: SECOND STAGE BASILIC VEIN TRANSPOSITION WITH REVISION OF ANASTOMOSIS LEFT UPPER ARM;  Surgeon: Conrad Lapeer, MD;  Location: [place];  Service: Vascular;  Laterality: Left;  . CARDIOVERSION    . CARDIOVERSION N/A 01/04/2019   Procedure: CARDIOVERSION;  Surgeon: Lelon Perla, MD;  Location[facility] END[facility]COPY;  Service: Cardiovascular;  Laterality: N/A;  . REPAIR OF ACUTE ASCENDING THORACIC AORTIC DISSECTION    . REVISON OF ARTERIOVENOUS FISTULA Left 04/20/2016   Procedure: LIGATION OF BASILIC VEIN TRANSPOSITION;  Surgeon: Conrad [place], MD;  Location:  [facility] OR;  Service: Vascular;  Laterality: Left;  . WOUND DEBRIDEMENT Left 04/13/2016   Procedure: DEBRIDEMENT WOUND;  Surgeon: Conrad [place], MD;  [facility] OR;  Service: Vascular;  Laterality: Left;    Current Medications: Current Meds  Medication Sig  . amiodarone (PACERONE) 200 MG tablet Take 1 tablet (200 mg total) by mouth daily.  . cinacalcet (SENSIPAR) 90 MG tablet Take 90 mg by mouth daily with supper.   . metoprolol tartrate (LOPRESSOR) 25 MG tablet Take 0.5 tablets (12.5 mg total) by mouth 2 (two) times daily. Hold on the AM of dialysis days  . sevelamer carbonate (RENVELA) 0.8 g PACK packet Take 1.6 g by mouth 3 (three)  times daily with meals.  . warfarin (COUMADIN) 5 MG tablet Take 1 tablet daily as directed by coumadin clinic     Allergies:   Ciprofloxacin, Heparin, and Quinolones   Social History   Socioeconomic History  . Marital status: Divorced    Spouse name: Not on file  . Number of children: 2  . Years of education: Not on file  . Highest education level: Not on file  Occupational History  . Occupation: DIABLED  Tobacco Use  . Smoking status: Never Smoker  . Smokeless tobacco: Never Used  Substance and Sexual Activity  . Alcohol use: No  . Drug use: No  . Sexual activity: Not on file  Other Topics Concern  . Not on file  Social History Narrative  . Not on file   Social Determinants of Health   Financial Resource Strain:   . Difficulty of Paying Living Expenses:   Food Insecurity:   . Worried About Charity fundraiser in the Last Year:   . Arboriculturist in the Last Year:   Transportation Needs:   . Film/video editor (Medical):   [name] Lack of Transportation (Non-Medical):   Physical Activity:   . Days of Exercise per Week:   . Minutes of Exercise per Session:   Stress:   . Feeling of Stress :   Social Connections:   . Frequency of Communication with Friends and Family:   . Frequency of Social Gatherings with Friends and Family:   . Attends Religious Services:   . Active Member of Clubs or Organizations:   . Attends Archivist Meetings:   [name] Marital Status:      Family History: The patient's family history includes Cancer in his father and mother; Hypertension in his father and mother; Stroke in his brother; Thyroid disease in his sister.  ROS:   Please see the history of present illness.     All other systems reviewed and are negative.  EKGs/Labs/Other Studies Reviewed:    The following studies were reviewed today:  CTA CHEST IMPRESSION 03/2019: Stable type B thoracic aortic dissection is noted as described above. Grossly stable proximal descending  thoracic aneurysmal dilatation is noted at 4.6 cm, with grossly stable 4.2 cm descending thoracic aortic aneurysmal dilatation. Recommend semi-annual imaging followup by CTA or MRA and referral to cardiothoracic surgery if not already obtained.   Echo 05/20/18  1. The left ventricle has normal systolic function with an ejection fraction of 60-65%. The cavity size was normal. There is mildly increased left ventricular wall thickness. Left ventricular diastology could not be evaluated due to nondiagnostic  images. No evidence of left ventricular regional wall motion abnormalities. 2. The right ventricle has normal systolic function. The cavity was normal. There is no increase in right  ventricular wall thickness. Right ventricular systolic pressure is moderately elevated with an estimated pressure of 42.8 mmHg. 3. The pericardial effusion is posterior to the left ventricle. 4. Trivial pericardial effusion. 5. The mitral valve is normal in structure. 6. The tricuspid valve is normal in structure. 7. The aortic valve is tricuspid Mild sclerosis of the aortic valve. 8. The pulmonic valve was normal in structure. 9. The inferior vena cava was dilated in size with >50% respiratory variability. 10. No evidence of left ventricular regional wall motion abnormalities. 11. Right atrial pressure is estimated at 8 mmHg.  EKG:  ? Slow atrial flutter - ?atrial tach - has increased VR - HR is 103  Recent Labs: 08/12/2019: BUN 35; Creatinine, Ser 8.18; Hemoglobin 11.6; Platelets 143; Potassium 4.2; Sodium 141; TSH 2.490  Recent Lipid Panel    Component Value Date/Time   CHOL 150 05/20/2018 0243   TRIG 148 05/20/2018 0243   HDL 27 (L) 05/20/2018 0243   CHOLHDL 5.6 05/20/2018 0243   VLDL 30 05/20/2018 0243   LDLCALC 93 05/20/2018 0243    Physical Exam:    VS:  BP 110/70   Pulse 93   Ht 6\' 2"  (1.88 m)   Wt 164 lb (74.4 kg)   SpO2 97%   BMI 21.06 kg/m     Wt Readings from Last 3  Encounters:  09/04/19 164 lb (74.4 kg)  08/12/19 164 lb 3.9 oz (74.5 kg)  03/07/19 176 lb 5.9 oz (80 kg)     GEN: Well nourished, well developed in no acute distress, in a wheelchair HEENT: Normal NECK: No JVD; No carotid bruits LYMPHATICS: No lymphadenopathy CARDIAC: RRR, no murmurs, rubs, gallops RESPIRATORY:  Clear to auscultation without rales, wheezing or rhonchi  ABDOMEN: Soft, non-tender, non-distended MUSCULOSKELETAL:  No edema; No deformity  SKIN: Warm and dry NEUROLOGIC:  Alert and oriented x 3 PSYCHIATRIC:  Normal affect   ASSESSMENT:    1. Long term (current) use of anticoagulants   2. End stage renal disease (HCC)   3. Persistent atrial fibrillation ([place])   4. Pre-procedure lab exam   5. Atrial fibrillation, unspecified type ([place])   6. Dissection of thoracic aorta (HCC)    PLAN:    In order of problems listed above:  PAF/flutter -- HR 103 prior, added back metoprolol low dose 12.5 BID holding on HD days because of hypotension.  Improved heart rate today, however he is still in this atrial flutter slow/atrial tachycardia.  Given his prolonged amiodarone use and Coumadin use which at last check was therapeutic at 2.1, I would like to go ahead and get him set up for a cardioversion.  Risks and benefits have been explained.  He is willing to proceed.  He has had a cardioversion last year in 01/2019.  We will need INR check.  ALT 7.  Hyperthyroidism -- did not have thyroidectomy, followed by [facility] .  Recent TSH 2.4, free T4 8.1.  Amiodarone may have been playing some of the role in thyroid condition.  Now stable.  Weight loss -- likely secondary thyroid, stable since last visit  Type B aortic dissection -- CT scan as above. BP control, paraplegia ordered by Dr. Servando Snare.  ESRD -- HD, stable.   Medication Adjustments/Labs and Tests Ordered: Current medicines are reviewed at length with the patient today.  Concerns regarding medicines are outlined  above.  Orders Placed This Encounter  Procedures  . Protime-INR  . Basic metabolic panel  . EKG 12-Lead  No orders of the defined types were placed in this encounter.   Patient Instructions  Medication Instructions:  The current medical regimen is effective;  continue present plan and medications.  *If you need a refill on your cardiac medications before your next appointment, please call your pharmacy*  Lab Work: Please have blood work at dialysis and have them fax [place] the results [id number]  (BMP, PT/INR) DX: I48.91, N18.6, Z01.812.  If you have labs (blood work) drawn today and your tests are completely normal, you will receive your results only by: [name] MyChart Message (if you have MyChart) OR . A paper copy in the mail If you have any lab test that is abnormal or we need to change your treatment, we will call you to review the results.  You will need COVID screen prior to your cardioversion as scheduled.  This is completed at [place] at the old [facility].  Follow the signs for pre-procedure screening.  Stay in your vehicle and they will come to you.  Testing/Procedures: Your physician has requested that you have a Cardioversion. . Electrical Cardioversion uses a jolt of electricity to your heart either through paddles or wired patches attached to your chest. This is a controlled, usually prescheduled, procedure. This procedure is done at the hospital and you are not awake during the procedure. You usually go home the day of the procedure. Please see the instruction sheet given to you today for more information.  Follow-Up: At [facility], you and your health needs are our priority.  As part of our continuing mission to provide you with exceptional heart care, we have created designated Provider Care Teams.  These Care Teams include your primary Cardiologist (physician) and Advanced Practice Providers (APPs -  Physician Assistants and Nurse Practitioners) who all  work together to provide you with the care you need, when you need it.  We recommend signing up for the patient portal called "MyChart".  Sign up information is provided on this After Visit Summary.  MyChart is used to connect with patients for Virtual Visits (Telemedicine).  Patients are able to view lab/test results, encounter notes, upcoming appointments, etc.  Non-urgent messages can be sent to your provider as well.   To learn more about what you can do with MyChart, go to [email or web address].    Your next appointment:   2-3 week(s)  The format for your next appointment:   In Person  Provider:   Truitt Merle, NP   Thank you for choosing [facility]!!        Signed, Candee Furbish, MD  09/04/2019 10:48 AM    [place]

## 2019-09-04 NOTE — Patient Instructions (Addendum)
Medication Instructions:  The current medical regimen is effective;  continue present plan and medications.  *If you need a refill on your cardiac medications before your next appointment, please call your pharmacy*  Lab Work: Please have blood work at dialysis and have them fax [place] the results [phone]  (BMP, PT/INR) DX: I48.91, N18.6, Z01.812.  If you have labs (blood work) drawn today and your tests are completely normal, you will receive your results only by: [name] MyChart Message (if you have MyChart) OR . A paper copy in the mail If you have any lab test that is abnormal or we need to change your treatment, we will call you to review the results.  You will need COVID screen prior to your cardioversion as scheduled.  This is completed at [place] at the old [facility].  Follow the signs for pre-procedure screening.  Stay in your vehicle and they will come to you.  Testing/Procedures: Your physician has requested that you have a Cardioversion. . Electrical Cardioversion uses a jolt of electricity to your heart either through paddles or wired patches attached to your chest. This is a controlled, usually prescheduled, procedure. This procedure is done at the hospital and you are not awake during the procedure. You usually go home the day of the procedure. Please see the instruction sheet given to you today for more information.  Follow-Up: At [facility], you and your health needs are our priority.  As part of our continuing mission to provide you with exceptional heart care, we have created designated Provider Care Teams.  These Care Teams include your primary Cardiologist (physician) and Advanced Practice Providers (APPs -  Physician Assistants and Nurse Practitioners) who all work together to provide you with the care you need, when you need it.  We recommend signing up for the patient portal called "MyChart".  Sign up information is provided on this After Visit Summary.   MyChart is used to connect with patients for Virtual Visits (Telemedicine).  Patients are able to view lab/test results, encounter notes, upcoming appointments, etc.  Non-urgent messages can be sent to your provider as well.   To learn more about what you can do with MyChart, go to [email or web address].    Your next appointment:   2-3 week(s)  The format for your next appointment:   In Person  Provider:   Truitt Merle, NP   Thank you for choosing [facility]!!

## 2019-09-04 NOTE — Patient Instructions (Addendum)
Description   Spoke with pt and instructed to take 1.5 tablets today and tomorrow then start taking 1 tablet daily except 1.5 tablets on Sundays. Recheck INR in 1 week. Please call Coumadin Clinic for any changes in medications or upcoming procedures. [phone].

## 2019-09-05 NOTE — Addendum Note (Signed)
Addended by: Jerline Pain on: 09/05/2019 06:53 AM   Modules accepted: Orders, SmartSet

## 2019-09-07 DIAGNOSIS — I482 Chronic atrial fibrillation, unspecified: Secondary | ICD-10-CM | POA: Diagnosis not present

## 2019-09-09 ENCOUNTER — Other Ambulatory Visit (HOSPITAL_COMMUNITY): Payer: Medicare Other

## 2019-09-11 ENCOUNTER — Telehealth: Payer: Self-pay | Admitting: *Deleted

## 2019-09-11 ENCOUNTER — Encounter (HOSPITAL_COMMUNITY): Admission: RE | Payer: Self-pay | Source: Ambulatory Visit

## 2019-09-11 ENCOUNTER — Ambulatory Visit (HOSPITAL_COMMUNITY): Admission: RE | Admit: 2019-09-11 | Payer: Medicare Other | Source: Ambulatory Visit | Admitting: Cardiovascular Disease

## 2019-09-11 SURGERY — CARDIOVERSION
Anesthesia: Monitor Anesthesia Care

## 2019-09-11 NOTE — Telephone Encounter (Signed)
Called pt since he was the scheduled to be seen at 915am for an INR check since he is pending a cardioversion and he did not show up this morning. The pt did not answer the phone so left a message for him to call back regarding getting rescheduled since he needs the weekly INR checks for cardioversion.

## 2019-09-13 NOTE — Telephone Encounter (Addendum)
Updated Dr. [name] and Pam RN verbally on yesterday (09/12/19) that the pt did not show up for his scheduled appt to start his weekly INR checks for pending cardioversion.  Called the pt since he did not return my call and left a message for the pt to call back regarding scheduling an appt for an INR check since he is pending cardioversion and this requires weekly INR checks. I left the Anticoagulation Clinic number and the main number to the office as well.

## 2019-09-16 ENCOUNTER — Other Ambulatory Visit: Payer: Self-pay

## 2019-09-16 ENCOUNTER — Ambulatory Visit (INDEPENDENT_AMBULATORY_CARE_PROVIDER_SITE_OTHER): Payer: Medicare Other | Admitting: *Deleted

## 2019-09-16 ENCOUNTER — Telehealth: Payer: Self-pay | Admitting: *Deleted

## 2019-09-16 DIAGNOSIS — Z5181 Encounter for therapeutic drug level monitoring: Secondary | ICD-10-CM | POA: Diagnosis not present

## 2019-09-16 DIAGNOSIS — I4891 Unspecified atrial fibrillation: Secondary | ICD-10-CM | POA: Diagnosis not present

## 2019-09-16 LAB — POCT INR: INR: 3.1 — AB (ref 2.0–3.0)

## 2019-09-16 MED ORDER — WARFARIN SODIUM 5 MG PO TABS: ORAL_TABLET | ORAL | 0 refills | Status: DC

## 2019-09-16 NOTE — Telephone Encounter (Signed)
Message Received: Today Shellia Cleverly, RN  Lutricia Feil, RN Great! Thank you - Yes, please continue the INR checks X 3 therapeutic for upcoming cardioversion.   Thank you          Previous Messages   ----- Message -----  From: Lutricia Feil, RN  Sent: 09/16/2019 10:59 AM EDT  To: Jerline Pain, MD, Shellia Cleverly, RN   Pt missed his INR check on 09/11/2019. Pt was pending cardioversion. Pt did come to appointment today to get his INR checked, INR 3.1, I scheduled to see him again next week to get his INR checked. He stated that he was still planning on having the cardioversion. Please let us know if you want [place] to continue doing weekly checks for DCCV. Thank you!

## 2019-09-16 NOTE — Patient Instructions (Addendum)
Description   Take 1/2 a tablet today and then continue taking 1 tablet daily except 1.5 tablets on Sundays. Recheck INR in 1 week. Pending DCCV. Please call Coumadin Clinic for any changes in medications or upcoming procedures. [phone].

## 2019-09-27 ENCOUNTER — Other Ambulatory Visit: Payer: Self-pay

## 2019-09-27 ENCOUNTER — Ambulatory Visit (INDEPENDENT_AMBULATORY_CARE_PROVIDER_SITE_OTHER): Payer: Medicare Other | Admitting: *Deleted

## 2019-09-27 DIAGNOSIS — I4891 Unspecified atrial fibrillation: Secondary | ICD-10-CM

## 2019-09-27 DIAGNOSIS — Z7901 Long term (current) use of anticoagulants: Secondary | ICD-10-CM

## 2019-09-27 LAB — POCT INR: INR: 2.8 (ref 2.0–3.0)

## 2019-09-27 NOTE — Patient Instructions (Signed)
Description   Take an extra 1/2 tablet today since you missed Wednesday's dose then continue taking 1 tablet daily except 1.5 tablets on Sundays. Recheck INR in 1 week. Pending DCCV. Please call Coumadin Clinic for any changes in medications or upcoming procedures. [phone].

## 2019-10-01 ENCOUNTER — Ambulatory Visit: Payer: Medicare Other | Admitting: Nurse Practitioner

## 2019-10-03 DIAGNOSIS — N186 End stage renal disease: Secondary | ICD-10-CM | POA: Diagnosis not present

## 2019-10-03 DIAGNOSIS — N2581 Secondary hyperparathyroidism of renal origin: Secondary | ICD-10-CM | POA: Diagnosis not present

## 2019-10-03 DIAGNOSIS — D689 Coagulation defect, unspecified: Secondary | ICD-10-CM | POA: Diagnosis not present

## 2019-10-03 DIAGNOSIS — Z992 Dependence on renal dialysis: Secondary | ICD-10-CM | POA: Diagnosis not present

## 2019-10-03 DIAGNOSIS — D631 Anemia in chronic kidney disease: Secondary | ICD-10-CM | POA: Diagnosis not present

## 2019-10-03 DIAGNOSIS — I129 Hypertensive chronic kidney disease with stage 1 through stage 4 chronic kidney disease, or unspecified chronic kidney disease: Secondary | ICD-10-CM | POA: Diagnosis not present

## 2019-10-05 DIAGNOSIS — D689 Coagulation defect, unspecified: Secondary | ICD-10-CM | POA: Diagnosis not present

## 2019-10-05 DIAGNOSIS — N2581 Secondary hyperparathyroidism of renal origin: Secondary | ICD-10-CM | POA: Diagnosis not present

## 2019-10-05 DIAGNOSIS — Z992 Dependence on renal dialysis: Secondary | ICD-10-CM | POA: Diagnosis not present

## 2019-10-05 DIAGNOSIS — D631 Anemia in chronic kidney disease: Secondary | ICD-10-CM | POA: Diagnosis not present

## 2019-10-05 DIAGNOSIS — N186 End stage renal disease: Secondary | ICD-10-CM | POA: Diagnosis not present

## 2019-10-10 DIAGNOSIS — I482 Chronic atrial fibrillation, unspecified: Secondary | ICD-10-CM | POA: Diagnosis not present

## 2019-10-10 DIAGNOSIS — N2581 Secondary hyperparathyroidism of renal origin: Secondary | ICD-10-CM | POA: Diagnosis not present

## 2019-10-10 DIAGNOSIS — Z992 Dependence on renal dialysis: Secondary | ICD-10-CM | POA: Diagnosis not present

## 2019-10-10 DIAGNOSIS — D631 Anemia in chronic kidney disease: Secondary | ICD-10-CM | POA: Diagnosis not present

## 2019-10-10 DIAGNOSIS — D689 Coagulation defect, unspecified: Secondary | ICD-10-CM | POA: Diagnosis not present

## 2019-10-10 DIAGNOSIS — N186 End stage renal disease: Secondary | ICD-10-CM | POA: Diagnosis not present

## 2019-10-12 DIAGNOSIS — Z992 Dependence on renal dialysis: Secondary | ICD-10-CM | POA: Diagnosis not present

## 2019-10-12 DIAGNOSIS — D689 Coagulation defect, unspecified: Secondary | ICD-10-CM | POA: Diagnosis not present

## 2019-10-12 DIAGNOSIS — N186 End stage renal disease: Secondary | ICD-10-CM | POA: Diagnosis not present

## 2019-10-12 DIAGNOSIS — D631 Anemia in chronic kidney disease: Secondary | ICD-10-CM | POA: Diagnosis not present

## 2019-10-12 DIAGNOSIS — N2581 Secondary hyperparathyroidism of renal origin: Secondary | ICD-10-CM | POA: Diagnosis not present

## 2019-10-16 ENCOUNTER — Telehealth: Payer: Self-pay | Admitting: *Deleted

## 2019-10-16 NOTE — Telephone Encounter (Signed)
Pt over due to have INR checked. Called pt and LMOM to call coumadin clinic.

## 2019-10-17 DIAGNOSIS — D689 Coagulation defect, unspecified: Secondary | ICD-10-CM | POA: Diagnosis not present

## 2019-10-17 DIAGNOSIS — N2581 Secondary hyperparathyroidism of renal origin: Secondary | ICD-10-CM | POA: Diagnosis not present

## 2019-10-17 DIAGNOSIS — D631 Anemia in chronic kidney disease: Secondary | ICD-10-CM | POA: Diagnosis not present

## 2019-10-17 DIAGNOSIS — Z992 Dependence on renal dialysis: Secondary | ICD-10-CM | POA: Diagnosis not present

## 2019-10-17 DIAGNOSIS — N186 End stage renal disease: Secondary | ICD-10-CM | POA: Diagnosis not present

## 2019-10-19 DIAGNOSIS — N186 End stage renal disease: Secondary | ICD-10-CM | POA: Diagnosis not present

## 2019-10-19 DIAGNOSIS — Z992 Dependence on renal dialysis: Secondary | ICD-10-CM | POA: Diagnosis not present

## 2019-10-19 DIAGNOSIS — D689 Coagulation defect, unspecified: Secondary | ICD-10-CM | POA: Diagnosis not present

## 2019-10-19 DIAGNOSIS — D631 Anemia in chronic kidney disease: Secondary | ICD-10-CM | POA: Diagnosis not present

## 2019-10-19 DIAGNOSIS — N2581 Secondary hyperparathyroidism of renal origin: Secondary | ICD-10-CM | POA: Diagnosis not present

## 2019-10-23 ENCOUNTER — Other Ambulatory Visit: Payer: Self-pay | Admitting: *Deleted

## 2019-10-23 DIAGNOSIS — I712 Thoracic aortic aneurysm, without rupture, unspecified: Secondary | ICD-10-CM

## 2019-10-24 DIAGNOSIS — N186 End stage renal disease: Secondary | ICD-10-CM | POA: Diagnosis not present

## 2019-10-24 DIAGNOSIS — Z992 Dependence on renal dialysis: Secondary | ICD-10-CM | POA: Diagnosis not present

## 2019-10-24 DIAGNOSIS — D689 Coagulation defect, unspecified: Secondary | ICD-10-CM | POA: Diagnosis not present

## 2019-10-24 DIAGNOSIS — N2581 Secondary hyperparathyroidism of renal origin: Secondary | ICD-10-CM | POA: Diagnosis not present

## 2019-10-24 DIAGNOSIS — D631 Anemia in chronic kidney disease: Secondary | ICD-10-CM | POA: Diagnosis not present

## 2019-10-26 DIAGNOSIS — N186 End stage renal disease: Secondary | ICD-10-CM | POA: Diagnosis not present

## 2019-10-26 DIAGNOSIS — D689 Coagulation defect, unspecified: Secondary | ICD-10-CM | POA: Diagnosis not present

## 2019-10-26 DIAGNOSIS — N2581 Secondary hyperparathyroidism of renal origin: Secondary | ICD-10-CM | POA: Diagnosis not present

## 2019-10-26 DIAGNOSIS — D631 Anemia in chronic kidney disease: Secondary | ICD-10-CM | POA: Diagnosis not present

## 2019-10-26 DIAGNOSIS — Z992 Dependence on renal dialysis: Secondary | ICD-10-CM | POA: Diagnosis not present

## 2019-10-31 ENCOUNTER — Telehealth: Payer: Self-pay

## 2019-10-31 DIAGNOSIS — Z992 Dependence on renal dialysis: Secondary | ICD-10-CM | POA: Diagnosis not present

## 2019-10-31 DIAGNOSIS — D631 Anemia in chronic kidney disease: Secondary | ICD-10-CM | POA: Diagnosis not present

## 2019-10-31 DIAGNOSIS — N186 End stage renal disease: Secondary | ICD-10-CM | POA: Diagnosis not present

## 2019-10-31 DIAGNOSIS — D689 Coagulation defect, unspecified: Secondary | ICD-10-CM | POA: Diagnosis not present

## 2019-10-31 DIAGNOSIS — N2581 Secondary hyperparathyroidism of renal origin: Secondary | ICD-10-CM | POA: Diagnosis not present

## 2019-10-31 NOTE — Telephone Encounter (Signed)
lmom for overdue inr 

## 2019-11-02 DIAGNOSIS — Z992 Dependence on renal dialysis: Secondary | ICD-10-CM | POA: Diagnosis not present

## 2019-11-02 DIAGNOSIS — D689 Coagulation defect, unspecified: Secondary | ICD-10-CM | POA: Diagnosis not present

## 2019-11-02 DIAGNOSIS — N186 End stage renal disease: Secondary | ICD-10-CM | POA: Diagnosis not present

## 2019-11-02 DIAGNOSIS — N2581 Secondary hyperparathyroidism of renal origin: Secondary | ICD-10-CM | POA: Diagnosis not present

## 2019-11-02 DIAGNOSIS — D631 Anemia in chronic kidney disease: Secondary | ICD-10-CM | POA: Diagnosis not present

## 2019-11-03 DIAGNOSIS — I129 Hypertensive chronic kidney disease with stage 1 through stage 4 chronic kidney disease, or unspecified chronic kidney disease: Secondary | ICD-10-CM | POA: Diagnosis not present

## 2019-11-03 DIAGNOSIS — N186 End stage renal disease: Secondary | ICD-10-CM | POA: Diagnosis not present

## 2019-11-03 DIAGNOSIS — Z992 Dependence on renal dialysis: Secondary | ICD-10-CM | POA: Diagnosis not present

## 2019-11-04 ENCOUNTER — Other Ambulatory Visit: Payer: Self-pay

## 2019-11-04 ENCOUNTER — Ambulatory Visit (INDEPENDENT_AMBULATORY_CARE_PROVIDER_SITE_OTHER): Payer: Medicare Other | Admitting: *Deleted

## 2019-11-04 DIAGNOSIS — Z5181 Encounter for therapeutic drug level monitoring: Secondary | ICD-10-CM

## 2019-11-04 DIAGNOSIS — I4891 Unspecified atrial fibrillation: Secondary | ICD-10-CM

## 2019-11-04 LAB — POCT INR: INR: 2.2 (ref 2.0–3.0)

## 2019-11-04 MED ORDER — WARFARIN SODIUM 5 MG PO TABS: ORAL_TABLET | ORAL | 0 refills | Status: DC

## 2019-11-04 NOTE — Patient Instructions (Addendum)
Description   Continue taking 1 tablet daily. Recheck INR in 3 weeks. Please call Coumadin Clinic for any changes in medications or upcoming procedures. [phone].

## 2019-11-07 DIAGNOSIS — Z992 Dependence on renal dialysis: Secondary | ICD-10-CM | POA: Diagnosis not present

## 2019-11-07 DIAGNOSIS — I482 Chronic atrial fibrillation, unspecified: Secondary | ICD-10-CM | POA: Diagnosis not present

## 2019-11-07 DIAGNOSIS — N2581 Secondary hyperparathyroidism of renal origin: Secondary | ICD-10-CM | POA: Diagnosis not present

## 2019-11-07 DIAGNOSIS — D631 Anemia in chronic kidney disease: Secondary | ICD-10-CM | POA: Diagnosis not present

## 2019-11-07 DIAGNOSIS — N186 End stage renal disease: Secondary | ICD-10-CM | POA: Diagnosis not present

## 2019-11-07 DIAGNOSIS — D689 Coagulation defect, unspecified: Secondary | ICD-10-CM | POA: Diagnosis not present

## 2019-11-07 DIAGNOSIS — D509 Iron deficiency anemia, unspecified: Secondary | ICD-10-CM | POA: Diagnosis not present

## 2019-11-09 DIAGNOSIS — D689 Coagulation defect, unspecified: Secondary | ICD-10-CM | POA: Diagnosis not present

## 2019-11-09 DIAGNOSIS — D631 Anemia in chronic kidney disease: Secondary | ICD-10-CM | POA: Diagnosis not present

## 2019-11-09 DIAGNOSIS — D509 Iron deficiency anemia, unspecified: Secondary | ICD-10-CM | POA: Diagnosis not present

## 2019-11-09 DIAGNOSIS — N2581 Secondary hyperparathyroidism of renal origin: Secondary | ICD-10-CM | POA: Diagnosis not present

## 2019-11-09 DIAGNOSIS — Z992 Dependence on renal dialysis: Secondary | ICD-10-CM | POA: Diagnosis not present

## 2019-11-09 DIAGNOSIS — N186 End stage renal disease: Secondary | ICD-10-CM | POA: Diagnosis not present

## 2019-11-14 ENCOUNTER — Inpatient Hospital Stay: Admission: RE | Admit: 2019-11-14 | Payer: Medicare Other | Source: Ambulatory Visit

## 2019-11-14 ENCOUNTER — Ambulatory Visit: Payer: Medicare Other | Admitting: Cardiothoracic Surgery

## 2019-11-14 DIAGNOSIS — Z992 Dependence on renal dialysis: Secondary | ICD-10-CM | POA: Diagnosis not present

## 2019-11-14 DIAGNOSIS — D631 Anemia in chronic kidney disease: Secondary | ICD-10-CM | POA: Diagnosis not present

## 2019-11-14 DIAGNOSIS — D509 Iron deficiency anemia, unspecified: Secondary | ICD-10-CM | POA: Diagnosis not present

## 2019-11-14 DIAGNOSIS — D689 Coagulation defect, unspecified: Secondary | ICD-10-CM | POA: Diagnosis not present

## 2019-11-14 DIAGNOSIS — N2581 Secondary hyperparathyroidism of renal origin: Secondary | ICD-10-CM | POA: Diagnosis not present

## 2019-11-14 DIAGNOSIS — N186 End stage renal disease: Secondary | ICD-10-CM | POA: Diagnosis not present

## 2019-11-16 DIAGNOSIS — N186 End stage renal disease: Secondary | ICD-10-CM | POA: Diagnosis not present

## 2019-11-16 DIAGNOSIS — N2581 Secondary hyperparathyroidism of renal origin: Secondary | ICD-10-CM | POA: Diagnosis not present

## 2019-11-16 DIAGNOSIS — D689 Coagulation defect, unspecified: Secondary | ICD-10-CM | POA: Diagnosis not present

## 2019-11-16 DIAGNOSIS — D631 Anemia in chronic kidney disease: Secondary | ICD-10-CM | POA: Diagnosis not present

## 2019-11-16 DIAGNOSIS — D509 Iron deficiency anemia, unspecified: Secondary | ICD-10-CM | POA: Diagnosis not present

## 2019-11-16 DIAGNOSIS — Z992 Dependence on renal dialysis: Secondary | ICD-10-CM | POA: Diagnosis not present

## 2019-11-21 DIAGNOSIS — D689 Coagulation defect, unspecified: Secondary | ICD-10-CM | POA: Diagnosis not present

## 2019-11-21 DIAGNOSIS — D509 Iron deficiency anemia, unspecified: Secondary | ICD-10-CM | POA: Diagnosis not present

## 2019-11-21 DIAGNOSIS — N186 End stage renal disease: Secondary | ICD-10-CM | POA: Diagnosis not present

## 2019-11-21 DIAGNOSIS — N2581 Secondary hyperparathyroidism of renal origin: Secondary | ICD-10-CM | POA: Diagnosis not present

## 2019-11-21 DIAGNOSIS — D631 Anemia in chronic kidney disease: Secondary | ICD-10-CM | POA: Diagnosis not present

## 2019-11-21 DIAGNOSIS — Z992 Dependence on renal dialysis: Secondary | ICD-10-CM | POA: Diagnosis not present

## 2019-11-23 DIAGNOSIS — D689 Coagulation defect, unspecified: Secondary | ICD-10-CM | POA: Diagnosis not present

## 2019-11-23 DIAGNOSIS — D631 Anemia in chronic kidney disease: Secondary | ICD-10-CM | POA: Diagnosis not present

## 2019-11-23 DIAGNOSIS — N186 End stage renal disease: Secondary | ICD-10-CM | POA: Diagnosis not present

## 2019-11-23 DIAGNOSIS — D509 Iron deficiency anemia, unspecified: Secondary | ICD-10-CM | POA: Diagnosis not present

## 2019-11-23 DIAGNOSIS — N2581 Secondary hyperparathyroidism of renal origin: Secondary | ICD-10-CM | POA: Diagnosis not present

## 2019-11-23 DIAGNOSIS — Z992 Dependence on renal dialysis: Secondary | ICD-10-CM | POA: Diagnosis not present

## 2019-11-25 ENCOUNTER — Telehealth: Payer: Self-pay | Admitting: *Deleted

## 2019-11-25 NOTE — Telephone Encounter (Signed)
Pt was due an INR check today and did not show up or call. Called pt and had to leave a message for him to call back to reschedule. Will await a call back.

## 2019-11-27 ENCOUNTER — Telehealth: Payer: Self-pay

## 2019-11-27 ENCOUNTER — Other Ambulatory Visit: Payer: Self-pay

## 2019-11-27 ENCOUNTER — Ambulatory Visit
Admission: RE | Admit: 2019-11-27 | Discharge: 2019-11-27 | Disposition: A | Discharge status: Home / Self Care | Payer: Medicare Other | Source: Ambulatory Visit | Attending: Cardiothoracic Surgery | Admitting: Cardiothoracic Surgery

## 2019-11-27 DIAGNOSIS — I7101 Dissection of thoracic aorta: Secondary | ICD-10-CM | POA: Diagnosis not present

## 2019-11-27 DIAGNOSIS — I712 Thoracic aortic aneurysm, without rupture, unspecified: Secondary | ICD-10-CM

## 2019-11-27 DIAGNOSIS — I2510 Atherosclerotic heart disease of native coronary artery without angina pectoris: Secondary | ICD-10-CM | POA: Diagnosis not present

## 2019-11-27 DIAGNOSIS — J8489 Other specified interstitial pulmonary diseases: Secondary | ICD-10-CM | POA: Diagnosis not present

## 2019-11-27 MED ORDER — IOPAMIDOL (ISOVUE-370) INJECTION 76%
75.0000 mL | Freq: Once | INTRAVENOUS | Status: AC | PRN
  Administered 2019-11-27: 75 mL via INTRAVENOUS

## 2019-11-27 NOTE — Telephone Encounter (Signed)
Called and lmomed the pt for overdue inr

## 2019-11-28 DIAGNOSIS — Z992 Dependence on renal dialysis: Secondary | ICD-10-CM | POA: Diagnosis not present

## 2019-11-28 DIAGNOSIS — D689 Coagulation defect, unspecified: Secondary | ICD-10-CM | POA: Diagnosis not present

## 2019-11-28 DIAGNOSIS — N2581 Secondary hyperparathyroidism of renal origin: Secondary | ICD-10-CM | POA: Diagnosis not present

## 2019-11-28 DIAGNOSIS — D631 Anemia in chronic kidney disease: Secondary | ICD-10-CM | POA: Diagnosis not present

## 2019-11-28 DIAGNOSIS — D509 Iron deficiency anemia, unspecified: Secondary | ICD-10-CM | POA: Diagnosis not present

## 2019-11-28 DIAGNOSIS — N186 End stage renal disease: Secondary | ICD-10-CM | POA: Diagnosis not present

## 2019-11-30 DIAGNOSIS — D631 Anemia in chronic kidney disease: Secondary | ICD-10-CM | POA: Diagnosis not present

## 2019-11-30 DIAGNOSIS — N2581 Secondary hyperparathyroidism of renal origin: Secondary | ICD-10-CM | POA: Diagnosis not present

## 2019-11-30 DIAGNOSIS — D509 Iron deficiency anemia, unspecified: Secondary | ICD-10-CM | POA: Diagnosis not present

## 2019-11-30 DIAGNOSIS — N186 End stage renal disease: Secondary | ICD-10-CM | POA: Diagnosis not present

## 2019-11-30 DIAGNOSIS — Z992 Dependence on renal dialysis: Secondary | ICD-10-CM | POA: Diagnosis not present

## 2019-11-30 DIAGNOSIS — D689 Coagulation defect, unspecified: Secondary | ICD-10-CM | POA: Diagnosis not present

## 2019-12-04 ENCOUNTER — Telehealth: Payer: Self-pay | Admitting: *Deleted

## 2019-12-04 DIAGNOSIS — I129 Hypertensive chronic kidney disease with stage 1 through stage 4 chronic kidney disease, or unspecified chronic kidney disease: Secondary | ICD-10-CM | POA: Diagnosis not present

## 2019-12-04 DIAGNOSIS — N186 End stage renal disease: Secondary | ICD-10-CM | POA: Diagnosis not present

## 2019-12-04 DIAGNOSIS — Z992 Dependence on renal dialysis: Secondary | ICD-10-CM | POA: Diagnosis not present

## 2019-12-04 NOTE — Telephone Encounter (Signed)
Pt is overdue for INR check and there has been multiple calls for the pt to callback to reschedule an Anticoagulation appt. Will await a callback.

## 2019-12-04 NOTE — Progress Notes (Addendum)
[address]       [place] [address]             [phone]       Telehealth Visit1     Virtual Visit via Telephone Note   This visit type was conducted due to national recommendations for restrictions regarding the COVID-19 Pandemic (e.g. social distancing) in an effort to limit this patient's exposure and mitigate transmission in our community.  Due to his co-morbid illnesses, this patient is at least at moderate risk for complications without adequate follow up.  This format is felt to be most appropriate for this patient at this time.  The patient did not have access to video technology/had technical difficulties with video requiring transitioning to audio format only (telephone).  All issues noted in this document were discussed and addressed.  No physical exam could be performed with this format.   Patient: at Home  Provider: working from [employer]   Time spent :on phone 10 min                     [facility] Record #[id number] Date of Birth: 1970-05-25  Referring: Pixie Casino, MD Primary Care: Clent Demark, PA-C Primary Cardiologist: Candee Furbish, MD  Chief Complaint:    Follow-up CT of the chest  History of Present Illness:    Corey [name] 49 y.o. male  Referred by cardiology after aortic dissection while living in [place] in 5284, complicated by paraplegia and severe neuropathy. Patient confined to wheel chair.  Patient obtained a follow-up CT scan, due to Covid and also the patient's difficulty in transportation he was agreeable with discussing the results over the phone. He has had no new symptoms referable to his previous dissection  Previous scan was done in March 2020    Current Activity/ Functional Status:  Patient is not independent with mobility/ambulation, transfers, ADL's, IADL's.   Zubrod Score: At the time of surgery this patient's most appropriate activity status/level should be described as: []      0    Normal activity, no symptoms []     1    Restricted in physical strenuous activity but ambulatory, able to do out light work []     2    Ambulatory and capable of self care, unable to do work activities, up and about               >50 % of waking hours                              [x]     3    Only limited self care, in bed greater than 50% of waking hours []     4    Completely disabled, no self care, confined to bed or chair []     5    Moribund   Past Medical History:  Diagnosis Date  . Arthritis    hands and shoulders  . Blindness and low vision    "Stargardt disease"  . Dissection of aorta ([facility]) 2004   a. s/p extensive repeair in 1324 in [place] complicated by ESRD, lower extremity paralysis, coma, and extended hospitalization of 2 years  . ESRD (end stage renal disease) ([facility])    a. TTS  . Headache   . History of cardioversion 2014  . Hypertension   . Neuropathy   . Non-healing  non-surgical wound 03/2016  . PAF (paroxysmal atrial fibrillation) ([facility])    a. s/p DCCV in 2014; b. on Coumadin; c. CHADS2VASc => 2 (HTN, vascular disease)  . Paralysis ([facility])    due to dissection of aorta in 2004, lower extremities  . Pneumonia     Past Surgical History:  Procedure Laterality Date  . APPLICATION OF WOUND VAC Left 04/13/2016   Procedure: APPLICATION OF WOUND VAC;  Surgeon: Conrad Plainfield Village, MD;  Location: [facility];  Service: Vascular;  Laterality: Left;  . APPLICATION OF WOUND VAC Left 04/18/2016   Procedure: APPLICATION OF WOUND VAC;  Surgeon: Waynetta Sandy, MD;  Location: [facility];  Service: Vascular;  Laterality: Left;  Wound vac change   . APPLICATION OF WOUND VAC Left 04/20/2016   Procedure: WOUND VAC CHANGE;  Surgeon: Conrad Blue Jay, MD;  Location: [facility];  Service: Vascular;  Laterality: Left;  . AV FISTULA PLACEMENT    . BASCILIC VEIN TRANSPOSITION Left 12/09/2015   Procedure: FIRST STAGE BASILIC VEIN TRANSPOSITION LEFT UPPER ARM;  Surgeon: Conrad Saddlebrooke, MD;  Location: [facility];  Service:  Vascular;  Laterality: Left;  . BASCILIC VEIN TRANSPOSITION Left 03/09/2016   Procedure: SECOND STAGE BASILIC VEIN TRANSPOSITION WITH REVISION OF ANASTOMOSIS LEFT UPPER ARM;  Surgeon: Conrad Faison, MD;  Location: [facility];  Service: Vascular;  Laterality: Left;  . CARDIOVERSION    . CARDIOVERSION N/A 01/04/2019   Procedure: CARDIOVERSION;  Surgeon: Lelon Perla, MD;  Location: [facility];  Service: Cardiovascular;  Laterality: N/A;  . REPAIR OF ACUTE ASCENDING THORACIC AORTIC DISSECTION    . REVISON OF ARTERIOVENOUS FISTULA Left 04/20/2016   Procedure: LIGATION OF BASILIC VEIN TRANSPOSITION;  Surgeon: Conrad Chilchinbito, MD;  Location: [facility];  Service: Vascular;  Laterality: Left;  . WOUND DEBRIDEMENT Left 04/13/2016   Procedure: DEBRIDEMENT WOUND;  Surgeon: Conrad Newborn, MD;  Location: [facility];  Service: Vascular;  Laterality: Left;    Family History  Problem Relation Age of Onset  . Cancer Mother   . Hypertension Mother   . Cancer Father   . Hypertension Father   . Thyroid disease Sister   . Stroke Brother        38     Social History   Tobacco Use  Smoking Status Never Smoker  Smokeless Tobacco Never Used    Social History   Substance and Sexual Activity  Alcohol Use No     Allergies  Allergen Reactions  . Ciprofloxacin Other (See Comments)    Aortic dissection  . Heparin Other (See Comments)    UNSPECIFIED REACTION :  On Coumadin since 2004   HIT panel negative 01/19/17  . Quinolones Other (See Comments)    unknown    Current Outpatient Medications  Medication Sig Dispense Refill  . amiodarone (PACERONE) 200 MG tablet Take 1 tablet (200 mg total) by mouth daily. 90 tablet 1  . cinacalcet (SENSIPAR) 90 MG tablet Take 90 mg by mouth at bedtime.     . metoprolol tartrate (LOPRESSOR) 25 MG tablet Take 0.5 tablets (12.5 mg total) by mouth 2 (two) times daily. Hold on the AM of dialysis days (Patient taking differently: Take 12.5 mg by mouth 2 (two) times daily. Take on  Wednesday, Thursday and Friday Hold on the AM of dialysis days) 90 tablet 3  . Multiple Vitamins-Minerals (MULTIVITAMIN WITH MINERALS) tablet Take 1 tablet by mouth daily.    [name] warfarin (COUMADIN) 5 MG tablet Take 1 to  1.5  tablets daily by mouth as directed by coumadin clinic 35 tablet 0   No current facility-administered medications for this visit.    Pertinent items are noted in HPI.   Review of Systems:     Cardiac Review of Systems: [Y] = yes  or   [ N ] = no   Chest Pain [ n   ]  Resting SOB [  n ] Exertional SOB  n[  ]  Orthopnea [n  ]   Pedal Edema Florencio.Farrier   ]    Palpitations [ n ] Syncope  [ n ]   Presyncope [  n]  General Review of Systems: [Y] = yes [  ]=no Constitional: recent weight change [  ];  Wt loss over the last 3 months [   ] anorexia [  ]; fatigue [  ]; nausea [  ]; night sweats [  ]; fever [  ]; or chills [  ];           Eye : blurred vision [  ]; diplopia [   ]; vision changes [  ];  Amaurosis fugax[  ]; Resp: cough [  ];  wheezing[  ];  hemoptysis[  ]; shortness of breath[  ]; paroxysmal nocturnal dyspnea[  ]; dyspnea on exertion[  ]; or orthopnea[  ];  GI:  gallstones[  ], vomiting[  ];  dysphagia[  ]; melena[  ];  hematochezia [  ]; heartburn[  ];   Hx of  Colonoscopy[  ]; GU: kidney stones [  ]; hematuria[  ];   dysuria [  ];  nocturia[  ];  history of     obstruction [  ]; urinary frequency [  ]             Skin: rash, swelling[  ];, hair loss[  ];  peripheral edema[  ];  or itching[  ]; Musculosketetal: myalgias[  ];  joint swelling[  ];  joint erythema[  ];  joint pain[  ];  back pain[  ];  Heme/Lymph: bruising[  ];  bleeding[  ];  anemia[  ];  Neuro: TIA[  ];  headaches[  ];  stroke[  ];  vertigo[  ];  seizures[  ];   paresthesias[ y ];  difficulty walking[ y ];  Psych:depression[  ]; anxiety[  ];  Endocrine: diabetes[  ];  thyroid dysfunction[  ];  Immunizations: Flu up to date [  ]; Pneumococcal up to date [  ];covid  Other:     PHYSICAL  EXAMINATION: There were no vitals taken for this visit. .Diagnostic Studies & Laboratory data:     Recent Radiology Findings:   CT ANGIO CHEST AORTA W/CM & OR WO/CM  Result Date: 11/27/2019 CLINICAL DATA:  Follow-up thoracic aortic aneurysm, previous ascending aortic dissection repair EXAM: CT ANGIOGRAPHY CHEST WITH CONTRAST TECHNIQUE: Multidetector CT imaging of the chest was performed using the standard protocol during bolus administration of intravenous contrast. Multiplanar CT image reconstructions and MIPs were obtained to evaluate the vascular anatomy. CONTRAST:  84mL ISOVUE-370 IOPAMIDOL (ISOVUE-370) INJECTION 76% COMPARISON:  03/07/2019 and previous FINDINGS: Cardiovascular: Central venous catheter extends to the proximal right atrium. Heart size normal. No pericardial effusion. Fair contrast opacification of the pulmonary arterial tree; the exam was not optimized for detection of pulmonary emboli. Scattered coronary calcifications. Aortic Root: --Valve: 2.9 cm --Sinuses: 3.8 cm --Sinotubular Junction: 3 cm Limitations by motion: Moderate Thoracic Aorta: --Ascending Aorta: 2.8 cm --Aortic Arch: 3.7 cm --Descending  Aorta: 4.6 cm, proximal segment (stable) Dissection flap extends from just beyond the left subclavian origin through the length of the descending thoracic aorta, with some narrowing of the true lumen and continued patency of the false lumen. Brachiocephalic vessels widely patent without dissection involvement. The dissection extends into the abdominal aorta at least as far as the celiac axis, distal extent not visualized. Mediastinum/Nodes: Subcentimeter subcarinal, prevascular, AP window, and pretracheal lymph nodes as before. No hilar adenopathy. Lungs/Pleura: No pleural effusion. Linear scarring or subsegmental atelectasis at the right lung base and laterally at the left lung base. A peripheral interstitial prominence, slightly more conspicuous than on prior study. No new airspace disease  or discrete nodule. Upper Abdomen: Multiple right renal cysts.  No acute findings. Musculoskeletal: Diffuse osseous sclerosis presumably renal osteodystrophy. Small Schmorl's nodes in the mid and lower thoracic spine. No acute fracture or worrisome bone lesion. Review of the MIP images confirms the above findings. IMPRESSION: 1. Stable thoracic aortic dissection flap extending from just beyond the left subclavian origin through the length of the descending thoracic aorta, into the proximal abdominal aorta, distal extent not visualized. 2. 4.6 cm proximal descending thoracic aortic aneurysm, stable. Recommend semi-annual imaging followup by CTA or MRA and referral to cardiothoracic surgery if not already obtained. This recommendation follows 2010 ACCF/AHA/AATS/ACR/ASA/SCA/SCAI/SIR/STS/SVM Guidelines for the Diagnosis and Management of Patients With Thoracic Aortic Disease. Circulation. 2010; 121: P824-M35. Aortic aneurysm NOS (ICD10-I71.9) 3. Coronary calcifications. The severity of coronary artery disease and any potential stenosis cannot be assessed on this non-gated CT examination. Electronically Signed   By: Lucrezia Europe M.D.   On: 11/27/2019 16:05     I have independently reviewed the above radiology studies  and reviewed the findings with the patient.   Recent Lab Findings: Lab Results  Component Value Date   WBC 9.3 08/12/2019   HGB 11.6 (L) 08/12/2019   HCT 34.0 (L) 08/12/2019   PLT 143 (L) 08/12/2019   GLUCOSE 83 09/04/2019   CHOL 150 05/20/2018   TRIG 148 05/20/2018   HDL 27 (L) 05/20/2018   LDLCALC 93 05/20/2018   ALT 7 05/21/2018   AST 8 (L) 05/21/2018   NA 140 09/04/2019   K 4.5 09/04/2019   CL 101 09/04/2019   CREATININE 11.83 (H) 09/04/2019   BUN 79 (HH) 09/04/2019   CO2 24 09/04/2019   TSH 2.490 08/12/2019   INR 2.2 11/04/2019      Assessment / Plan:   #1 chronic Type B dissection first noted 2004, distal arch stable at 4.6 cm- continue to monitor size. Follow up  CTA  8  months #2 chronic paraplegia related to initial aortic dissection and subsequent repair 2004 in [place] MD

## 2019-12-05 ENCOUNTER — Other Ambulatory Visit: Payer: Self-pay

## 2019-12-05 ENCOUNTER — Telehealth (INDEPENDENT_AMBULATORY_CARE_PROVIDER_SITE_OTHER): Payer: Medicare Other | Admitting: Cardiothoracic Surgery

## 2019-12-05 DIAGNOSIS — Z992 Dependence on renal dialysis: Secondary | ICD-10-CM | POA: Diagnosis not present

## 2019-12-05 DIAGNOSIS — D631 Anemia in chronic kidney disease: Secondary | ICD-10-CM | POA: Diagnosis not present

## 2019-12-05 DIAGNOSIS — I712 Thoracic aortic aneurysm, without rupture: Secondary | ICD-10-CM | POA: Diagnosis not present

## 2019-12-05 DIAGNOSIS — N186 End stage renal disease: Secondary | ICD-10-CM | POA: Diagnosis not present

## 2019-12-05 DIAGNOSIS — N2581 Secondary hyperparathyroidism of renal origin: Secondary | ICD-10-CM | POA: Diagnosis not present

## 2019-12-05 DIAGNOSIS — I482 Chronic atrial fibrillation, unspecified: Secondary | ICD-10-CM | POA: Diagnosis not present

## 2019-12-07 DIAGNOSIS — N2581 Secondary hyperparathyroidism of renal origin: Secondary | ICD-10-CM | POA: Diagnosis not present

## 2019-12-07 DIAGNOSIS — Z992 Dependence on renal dialysis: Secondary | ICD-10-CM | POA: Diagnosis not present

## 2019-12-07 DIAGNOSIS — D631 Anemia in chronic kidney disease: Secondary | ICD-10-CM | POA: Diagnosis not present

## 2019-12-07 DIAGNOSIS — N186 End stage renal disease: Secondary | ICD-10-CM | POA: Diagnosis not present

## 2019-12-12 DIAGNOSIS — N186 End stage renal disease: Secondary | ICD-10-CM | POA: Diagnosis not present

## 2019-12-12 DIAGNOSIS — D631 Anemia in chronic kidney disease: Secondary | ICD-10-CM | POA: Diagnosis not present

## 2019-12-12 DIAGNOSIS — Z992 Dependence on renal dialysis: Secondary | ICD-10-CM | POA: Diagnosis not present

## 2019-12-12 DIAGNOSIS — N2581 Secondary hyperparathyroidism of renal origin: Secondary | ICD-10-CM | POA: Diagnosis not present

## 2019-12-14 DIAGNOSIS — N2581 Secondary hyperparathyroidism of renal origin: Secondary | ICD-10-CM | POA: Diagnosis not present

## 2019-12-14 DIAGNOSIS — D631 Anemia in chronic kidney disease: Secondary | ICD-10-CM | POA: Diagnosis not present

## 2019-12-14 DIAGNOSIS — N186 End stage renal disease: Secondary | ICD-10-CM | POA: Diagnosis not present

## 2019-12-14 DIAGNOSIS — Z992 Dependence on renal dialysis: Secondary | ICD-10-CM | POA: Diagnosis not present

## 2019-12-17 ENCOUNTER — Telehealth: Payer: Self-pay | Admitting: Cardiothoracic Surgery

## 2019-12-17 NOTE — Telephone Encounter (Signed)
erir

## 2019-12-18 ENCOUNTER — Telehealth: Payer: Self-pay | Admitting: *Deleted

## 2019-12-18 NOTE — Telephone Encounter (Signed)
Called pt as he is overdue an INR appt; there was no answer so left a message for the pt to call back to schedule. Will await.

## 2019-12-19 DIAGNOSIS — N2581 Secondary hyperparathyroidism of renal origin: Secondary | ICD-10-CM | POA: Diagnosis not present

## 2019-12-19 DIAGNOSIS — Z992 Dependence on renal dialysis: Secondary | ICD-10-CM | POA: Diagnosis not present

## 2019-12-19 DIAGNOSIS — N186 End stage renal disease: Secondary | ICD-10-CM | POA: Diagnosis not present

## 2019-12-19 DIAGNOSIS — D631 Anemia in chronic kidney disease: Secondary | ICD-10-CM | POA: Diagnosis not present

## 2019-12-21 DIAGNOSIS — N2581 Secondary hyperparathyroidism of renal origin: Secondary | ICD-10-CM | POA: Diagnosis not present

## 2019-12-21 DIAGNOSIS — D631 Anemia in chronic kidney disease: Secondary | ICD-10-CM | POA: Diagnosis not present

## 2019-12-21 DIAGNOSIS — N186 End stage renal disease: Secondary | ICD-10-CM | POA: Diagnosis not present

## 2019-12-21 DIAGNOSIS — Z992 Dependence on renal dialysis: Secondary | ICD-10-CM | POA: Diagnosis not present

## 2019-12-25 ENCOUNTER — Telehealth: Payer: Self-pay | Admitting: *Deleted

## 2019-12-25 NOTE — Telephone Encounter (Signed)
Pt is overdue for INR check and he scheduled an appt for today and he did not show up, left him a message to call back to reschedule.

## 2019-12-26 DIAGNOSIS — N186 End stage renal disease: Secondary | ICD-10-CM | POA: Diagnosis not present

## 2019-12-26 DIAGNOSIS — Z992 Dependence on renal dialysis: Secondary | ICD-10-CM | POA: Diagnosis not present

## 2019-12-26 DIAGNOSIS — N2581 Secondary hyperparathyroidism of renal origin: Secondary | ICD-10-CM | POA: Diagnosis not present

## 2019-12-26 DIAGNOSIS — D631 Anemia in chronic kidney disease: Secondary | ICD-10-CM | POA: Diagnosis not present

## 2019-12-28 DIAGNOSIS — N186 End stage renal disease: Secondary | ICD-10-CM | POA: Diagnosis not present

## 2019-12-28 DIAGNOSIS — N2581 Secondary hyperparathyroidism of renal origin: Secondary | ICD-10-CM | POA: Diagnosis not present

## 2019-12-28 DIAGNOSIS — Z992 Dependence on renal dialysis: Secondary | ICD-10-CM | POA: Diagnosis not present

## 2019-12-28 DIAGNOSIS — D631 Anemia in chronic kidney disease: Secondary | ICD-10-CM | POA: Diagnosis not present

## 2019-12-30 ENCOUNTER — Ambulatory Visit (INDEPENDENT_AMBULATORY_CARE_PROVIDER_SITE_OTHER): Payer: Medicare Other | Admitting: *Deleted

## 2019-12-30 ENCOUNTER — Other Ambulatory Visit: Payer: Self-pay

## 2019-12-30 DIAGNOSIS — Z5181 Encounter for therapeutic drug level monitoring: Secondary | ICD-10-CM | POA: Diagnosis not present

## 2019-12-30 DIAGNOSIS — I4891 Unspecified atrial fibrillation: Secondary | ICD-10-CM | POA: Diagnosis not present

## 2019-12-30 LAB — POCT INR: INR: 2.2 (ref 2.0–3.0)

## 2019-12-30 MED ORDER — WARFARIN SODIUM 5 MG PO TABS: ORAL_TABLET | ORAL | 0 refills | Status: DC

## 2019-12-30 NOTE — Patient Instructions (Signed)
Description   Continue taking 1 tablet daily. Recheck INR in 2 weeks. Please call Coumadin Clinic for any changes in medications or upcoming procedures. [phone].

## 2020-01-02 DIAGNOSIS — N2581 Secondary hyperparathyroidism of renal origin: Secondary | ICD-10-CM | POA: Diagnosis not present

## 2020-01-02 DIAGNOSIS — Z992 Dependence on renal dialysis: Secondary | ICD-10-CM | POA: Diagnosis not present

## 2020-01-02 DIAGNOSIS — N186 End stage renal disease: Secondary | ICD-10-CM | POA: Diagnosis not present

## 2020-01-02 DIAGNOSIS — D631 Anemia in chronic kidney disease: Secondary | ICD-10-CM | POA: Diagnosis not present

## 2020-01-03 DIAGNOSIS — I129 Hypertensive chronic kidney disease with stage 1 through stage 4 chronic kidney disease, or unspecified chronic kidney disease: Secondary | ICD-10-CM | POA: Diagnosis not present

## 2020-01-03 DIAGNOSIS — N186 End stage renal disease: Secondary | ICD-10-CM | POA: Diagnosis not present

## 2020-01-03 DIAGNOSIS — Z992 Dependence on renal dialysis: Secondary | ICD-10-CM | POA: Diagnosis not present

## 2020-01-04 DIAGNOSIS — N186 End stage renal disease: Secondary | ICD-10-CM | POA: Diagnosis not present

## 2020-01-04 DIAGNOSIS — Z992 Dependence on renal dialysis: Secondary | ICD-10-CM | POA: Diagnosis not present

## 2020-01-04 DIAGNOSIS — D509 Iron deficiency anemia, unspecified: Secondary | ICD-10-CM | POA: Diagnosis not present

## 2020-01-04 DIAGNOSIS — D631 Anemia in chronic kidney disease: Secondary | ICD-10-CM | POA: Diagnosis not present

## 2020-01-04 DIAGNOSIS — D689 Coagulation defect, unspecified: Secondary | ICD-10-CM | POA: Diagnosis not present

## 2020-01-04 DIAGNOSIS — N2581 Secondary hyperparathyroidism of renal origin: Secondary | ICD-10-CM | POA: Diagnosis not present

## 2020-01-09 DIAGNOSIS — D689 Coagulation defect, unspecified: Secondary | ICD-10-CM | POA: Diagnosis not present

## 2020-01-09 DIAGNOSIS — I482 Chronic atrial fibrillation, unspecified: Secondary | ICD-10-CM | POA: Diagnosis not present

## 2020-01-09 DIAGNOSIS — N2581 Secondary hyperparathyroidism of renal origin: Secondary | ICD-10-CM | POA: Diagnosis not present

## 2020-01-09 DIAGNOSIS — D509 Iron deficiency anemia, unspecified: Secondary | ICD-10-CM | POA: Diagnosis not present

## 2020-01-09 DIAGNOSIS — D631 Anemia in chronic kidney disease: Secondary | ICD-10-CM | POA: Diagnosis not present

## 2020-01-09 DIAGNOSIS — N186 End stage renal disease: Secondary | ICD-10-CM | POA: Diagnosis not present

## 2020-01-09 DIAGNOSIS — Z992 Dependence on renal dialysis: Secondary | ICD-10-CM | POA: Diagnosis not present

## 2020-01-11 DIAGNOSIS — Z992 Dependence on renal dialysis: Secondary | ICD-10-CM | POA: Diagnosis not present

## 2020-01-11 DIAGNOSIS — D689 Coagulation defect, unspecified: Secondary | ICD-10-CM | POA: Diagnosis not present

## 2020-01-11 DIAGNOSIS — N2581 Secondary hyperparathyroidism of renal origin: Secondary | ICD-10-CM | POA: Diagnosis not present

## 2020-01-11 DIAGNOSIS — N186 End stage renal disease: Secondary | ICD-10-CM | POA: Diagnosis not present

## 2020-01-11 DIAGNOSIS — D631 Anemia in chronic kidney disease: Secondary | ICD-10-CM | POA: Diagnosis not present

## 2020-01-11 DIAGNOSIS — D509 Iron deficiency anemia, unspecified: Secondary | ICD-10-CM | POA: Diagnosis not present

## 2020-01-13 ENCOUNTER — Telehealth: Payer: Self-pay | Admitting: *Deleted

## 2020-01-13 NOTE — Telephone Encounter (Signed)
Pt missed appt today; called pt and left a message for him to call back & reschedule. Will await.

## 2020-01-16 DIAGNOSIS — D689 Coagulation defect, unspecified: Secondary | ICD-10-CM | POA: Diagnosis not present

## 2020-01-16 DIAGNOSIS — N186 End stage renal disease: Secondary | ICD-10-CM | POA: Diagnosis not present

## 2020-01-16 DIAGNOSIS — D631 Anemia in chronic kidney disease: Secondary | ICD-10-CM | POA: Diagnosis not present

## 2020-01-16 DIAGNOSIS — Z992 Dependence on renal dialysis: Secondary | ICD-10-CM | POA: Diagnosis not present

## 2020-01-16 DIAGNOSIS — D509 Iron deficiency anemia, unspecified: Secondary | ICD-10-CM | POA: Diagnosis not present

## 2020-01-16 DIAGNOSIS — N2581 Secondary hyperparathyroidism of renal origin: Secondary | ICD-10-CM | POA: Diagnosis not present

## 2020-01-18 DIAGNOSIS — Z992 Dependence on renal dialysis: Secondary | ICD-10-CM | POA: Diagnosis not present

## 2020-01-18 DIAGNOSIS — D509 Iron deficiency anemia, unspecified: Secondary | ICD-10-CM | POA: Diagnosis not present

## 2020-01-18 DIAGNOSIS — D631 Anemia in chronic kidney disease: Secondary | ICD-10-CM | POA: Diagnosis not present

## 2020-01-18 DIAGNOSIS — D689 Coagulation defect, unspecified: Secondary | ICD-10-CM | POA: Diagnosis not present

## 2020-01-18 DIAGNOSIS — N2581 Secondary hyperparathyroidism of renal origin: Secondary | ICD-10-CM | POA: Diagnosis not present

## 2020-01-18 DIAGNOSIS — N186 End stage renal disease: Secondary | ICD-10-CM | POA: Diagnosis not present

## 2020-01-23 DIAGNOSIS — D631 Anemia in chronic kidney disease: Secondary | ICD-10-CM | POA: Diagnosis not present

## 2020-01-23 DIAGNOSIS — D689 Coagulation defect, unspecified: Secondary | ICD-10-CM | POA: Diagnosis not present

## 2020-01-23 DIAGNOSIS — N2581 Secondary hyperparathyroidism of renal origin: Secondary | ICD-10-CM | POA: Diagnosis not present

## 2020-01-23 DIAGNOSIS — D509 Iron deficiency anemia, unspecified: Secondary | ICD-10-CM | POA: Diagnosis not present

## 2020-01-23 DIAGNOSIS — Z992 Dependence on renal dialysis: Secondary | ICD-10-CM | POA: Diagnosis not present

## 2020-01-23 DIAGNOSIS — N186 End stage renal disease: Secondary | ICD-10-CM | POA: Diagnosis not present

## 2020-01-25 DIAGNOSIS — N186 End stage renal disease: Secondary | ICD-10-CM | POA: Diagnosis not present

## 2020-01-25 DIAGNOSIS — D509 Iron deficiency anemia, unspecified: Secondary | ICD-10-CM | POA: Diagnosis not present

## 2020-01-25 DIAGNOSIS — D631 Anemia in chronic kidney disease: Secondary | ICD-10-CM | POA: Diagnosis not present

## 2020-01-25 DIAGNOSIS — D689 Coagulation defect, unspecified: Secondary | ICD-10-CM | POA: Diagnosis not present

## 2020-01-25 DIAGNOSIS — Z992 Dependence on renal dialysis: Secondary | ICD-10-CM | POA: Diagnosis not present

## 2020-01-25 DIAGNOSIS — N2581 Secondary hyperparathyroidism of renal origin: Secondary | ICD-10-CM | POA: Diagnosis not present

## 2020-01-30 ENCOUNTER — Telehealth: Payer: Self-pay | Admitting: *Deleted

## 2020-01-30 DIAGNOSIS — N2581 Secondary hyperparathyroidism of renal origin: Secondary | ICD-10-CM | POA: Diagnosis not present

## 2020-01-30 DIAGNOSIS — D509 Iron deficiency anemia, unspecified: Secondary | ICD-10-CM | POA: Diagnosis not present

## 2020-01-30 DIAGNOSIS — D631 Anemia in chronic kidney disease: Secondary | ICD-10-CM | POA: Diagnosis not present

## 2020-01-30 DIAGNOSIS — N186 End stage renal disease: Secondary | ICD-10-CM | POA: Diagnosis not present

## 2020-01-30 DIAGNOSIS — D689 Coagulation defect, unspecified: Secondary | ICD-10-CM | POA: Diagnosis not present

## 2020-01-30 DIAGNOSIS — Z992 Dependence on renal dialysis: Secondary | ICD-10-CM | POA: Diagnosis not present

## 2020-01-30 NOTE — Telephone Encounter (Signed)
Pt overdue to have INR checked. Called pt and LMOM for pt to call the coumadin clinic back to schedule an appointment to have INR checked.

## 2020-02-01 DIAGNOSIS — N2581 Secondary hyperparathyroidism of renal origin: Secondary | ICD-10-CM | POA: Diagnosis not present

## 2020-02-01 DIAGNOSIS — D631 Anemia in chronic kidney disease: Secondary | ICD-10-CM | POA: Diagnosis not present

## 2020-02-01 DIAGNOSIS — D689 Coagulation defect, unspecified: Secondary | ICD-10-CM | POA: Diagnosis not present

## 2020-02-01 DIAGNOSIS — Z992 Dependence on renal dialysis: Secondary | ICD-10-CM | POA: Diagnosis not present

## 2020-02-01 DIAGNOSIS — N186 End stage renal disease: Secondary | ICD-10-CM | POA: Diagnosis not present

## 2020-02-01 DIAGNOSIS — D509 Iron deficiency anemia, unspecified: Secondary | ICD-10-CM | POA: Diagnosis not present

## 2020-02-03 DIAGNOSIS — N186 End stage renal disease: Secondary | ICD-10-CM | POA: Diagnosis not present

## 2020-02-03 DIAGNOSIS — I129 Hypertensive chronic kidney disease with stage 1 through stage 4 chronic kidney disease, or unspecified chronic kidney disease: Secondary | ICD-10-CM | POA: Diagnosis not present

## 2020-02-03 DIAGNOSIS — Z992 Dependence on renal dialysis: Secondary | ICD-10-CM | POA: Diagnosis not present

## 2020-02-06 DIAGNOSIS — D509 Iron deficiency anemia, unspecified: Secondary | ICD-10-CM | POA: Diagnosis not present

## 2020-02-06 DIAGNOSIS — N2581 Secondary hyperparathyroidism of renal origin: Secondary | ICD-10-CM | POA: Diagnosis not present

## 2020-02-06 DIAGNOSIS — D689 Coagulation defect, unspecified: Secondary | ICD-10-CM | POA: Diagnosis not present

## 2020-02-06 DIAGNOSIS — D631 Anemia in chronic kidney disease: Secondary | ICD-10-CM | POA: Diagnosis not present

## 2020-02-06 DIAGNOSIS — I482 Chronic atrial fibrillation, unspecified: Secondary | ICD-10-CM | POA: Diagnosis not present

## 2020-02-06 DIAGNOSIS — N186 End stage renal disease: Secondary | ICD-10-CM | POA: Diagnosis not present

## 2020-02-06 DIAGNOSIS — Z992 Dependence on renal dialysis: Secondary | ICD-10-CM | POA: Diagnosis not present

## 2020-02-08 DIAGNOSIS — D631 Anemia in chronic kidney disease: Secondary | ICD-10-CM | POA: Diagnosis not present

## 2020-02-08 DIAGNOSIS — N2581 Secondary hyperparathyroidism of renal origin: Secondary | ICD-10-CM | POA: Diagnosis not present

## 2020-02-08 DIAGNOSIS — Z992 Dependence on renal dialysis: Secondary | ICD-10-CM | POA: Diagnosis not present

## 2020-02-08 DIAGNOSIS — D689 Coagulation defect, unspecified: Secondary | ICD-10-CM | POA: Diagnosis not present

## 2020-02-08 DIAGNOSIS — D509 Iron deficiency anemia, unspecified: Secondary | ICD-10-CM | POA: Diagnosis not present

## 2020-02-08 DIAGNOSIS — N186 End stage renal disease: Secondary | ICD-10-CM | POA: Diagnosis not present

## 2020-02-10 NOTE — Telephone Encounter (Signed)
Received INR for [facility], INR 2.14 on 02/06/20 received on 02/10/20.  Pt's goal is 2.0-3.0, pt comes into office for INR checks and he is currently overdue for follow-up. Ebony Hail, RN called pt today 02/10/20 and [facility] for pt TCB to schedule follow-up appt. Will await pt call back to schedule INR check in office.

## 2020-02-13 DIAGNOSIS — N2581 Secondary hyperparathyroidism of renal origin: Secondary | ICD-10-CM | POA: Diagnosis not present

## 2020-02-13 DIAGNOSIS — D631 Anemia in chronic kidney disease: Secondary | ICD-10-CM | POA: Diagnosis not present

## 2020-02-13 DIAGNOSIS — D689 Coagulation defect, unspecified: Secondary | ICD-10-CM | POA: Diagnosis not present

## 2020-02-13 DIAGNOSIS — N186 End stage renal disease: Secondary | ICD-10-CM | POA: Diagnosis not present

## 2020-02-13 DIAGNOSIS — D509 Iron deficiency anemia, unspecified: Secondary | ICD-10-CM | POA: Diagnosis not present

## 2020-02-13 DIAGNOSIS — Z992 Dependence on renal dialysis: Secondary | ICD-10-CM | POA: Diagnosis not present

## 2020-02-15 DIAGNOSIS — D509 Iron deficiency anemia, unspecified: Secondary | ICD-10-CM | POA: Diagnosis not present

## 2020-02-15 DIAGNOSIS — D631 Anemia in chronic kidney disease: Secondary | ICD-10-CM | POA: Diagnosis not present

## 2020-02-15 DIAGNOSIS — N2581 Secondary hyperparathyroidism of renal origin: Secondary | ICD-10-CM | POA: Diagnosis not present

## 2020-02-15 DIAGNOSIS — Z992 Dependence on renal dialysis: Secondary | ICD-10-CM | POA: Diagnosis not present

## 2020-02-15 DIAGNOSIS — D689 Coagulation defect, unspecified: Secondary | ICD-10-CM | POA: Diagnosis not present

## 2020-02-15 DIAGNOSIS — N186 End stage renal disease: Secondary | ICD-10-CM | POA: Diagnosis not present

## 2020-02-20 ENCOUNTER — Telehealth: Payer: Self-pay | Admitting: *Deleted

## 2020-02-20 DIAGNOSIS — D631 Anemia in chronic kidney disease: Secondary | ICD-10-CM | POA: Diagnosis not present

## 2020-02-20 DIAGNOSIS — Z992 Dependence on renal dialysis: Secondary | ICD-10-CM | POA: Diagnosis not present

## 2020-02-20 DIAGNOSIS — D689 Coagulation defect, unspecified: Secondary | ICD-10-CM | POA: Diagnosis not present

## 2020-02-20 DIAGNOSIS — D509 Iron deficiency anemia, unspecified: Secondary | ICD-10-CM | POA: Diagnosis not present

## 2020-02-20 DIAGNOSIS — N2581 Secondary hyperparathyroidism of renal origin: Secondary | ICD-10-CM | POA: Diagnosis not present

## 2020-02-20 DIAGNOSIS — N186 End stage renal disease: Secondary | ICD-10-CM | POA: Diagnosis not present

## 2020-02-20 NOTE — Telephone Encounter (Signed)
Called pt since he is overdue for Anticoagulation Appt. LM for pt to call back to get rescheduled as soon as he could and to call [phone] or [phone].

## 2020-02-22 DIAGNOSIS — D689 Coagulation defect, unspecified: Secondary | ICD-10-CM | POA: Diagnosis not present

## 2020-02-22 DIAGNOSIS — N2581 Secondary hyperparathyroidism of renal origin: Secondary | ICD-10-CM | POA: Diagnosis not present

## 2020-02-22 DIAGNOSIS — D509 Iron deficiency anemia, unspecified: Secondary | ICD-10-CM | POA: Diagnosis not present

## 2020-02-22 DIAGNOSIS — N186 End stage renal disease: Secondary | ICD-10-CM | POA: Diagnosis not present

## 2020-02-22 DIAGNOSIS — D631 Anemia in chronic kidney disease: Secondary | ICD-10-CM | POA: Diagnosis not present

## 2020-02-22 DIAGNOSIS — Z992 Dependence on renal dialysis: Secondary | ICD-10-CM | POA: Diagnosis not present

## 2020-02-26 DIAGNOSIS — D689 Coagulation defect, unspecified: Secondary | ICD-10-CM | POA: Diagnosis not present

## 2020-02-26 DIAGNOSIS — N186 End stage renal disease: Secondary | ICD-10-CM | POA: Diagnosis not present

## 2020-02-26 DIAGNOSIS — D509 Iron deficiency anemia, unspecified: Secondary | ICD-10-CM | POA: Diagnosis not present

## 2020-02-26 DIAGNOSIS — Z992 Dependence on renal dialysis: Secondary | ICD-10-CM | POA: Diagnosis not present

## 2020-02-26 DIAGNOSIS — D631 Anemia in chronic kidney disease: Secondary | ICD-10-CM | POA: Diagnosis not present

## 2020-02-26 DIAGNOSIS — N2581 Secondary hyperparathyroidism of renal origin: Secondary | ICD-10-CM | POA: Diagnosis not present

## 2020-02-29 DIAGNOSIS — D689 Coagulation defect, unspecified: Secondary | ICD-10-CM | POA: Diagnosis not present

## 2020-02-29 DIAGNOSIS — N2581 Secondary hyperparathyroidism of renal origin: Secondary | ICD-10-CM | POA: Diagnosis not present

## 2020-02-29 DIAGNOSIS — D631 Anemia in chronic kidney disease: Secondary | ICD-10-CM | POA: Diagnosis not present

## 2020-02-29 DIAGNOSIS — Z992 Dependence on renal dialysis: Secondary | ICD-10-CM | POA: Diagnosis not present

## 2020-02-29 DIAGNOSIS — D509 Iron deficiency anemia, unspecified: Secondary | ICD-10-CM | POA: Diagnosis not present

## 2020-02-29 DIAGNOSIS — N186 End stage renal disease: Secondary | ICD-10-CM | POA: Diagnosis not present

## 2020-03-04 DIAGNOSIS — Z992 Dependence on renal dialysis: Secondary | ICD-10-CM | POA: Diagnosis not present

## 2020-03-04 DIAGNOSIS — N186 End stage renal disease: Secondary | ICD-10-CM | POA: Diagnosis not present

## 2020-03-04 DIAGNOSIS — I129 Hypertensive chronic kidney disease with stage 1 through stage 4 chronic kidney disease, or unspecified chronic kidney disease: Secondary | ICD-10-CM | POA: Diagnosis not present

## 2020-03-05 DIAGNOSIS — Z992 Dependence on renal dialysis: Secondary | ICD-10-CM | POA: Diagnosis not present

## 2020-03-05 DIAGNOSIS — N186 End stage renal disease: Secondary | ICD-10-CM | POA: Diagnosis not present

## 2020-03-05 DIAGNOSIS — N2581 Secondary hyperparathyroidism of renal origin: Secondary | ICD-10-CM | POA: Diagnosis not present

## 2020-03-05 DIAGNOSIS — I482 Chronic atrial fibrillation, unspecified: Secondary | ICD-10-CM | POA: Diagnosis not present

## 2020-03-06 LAB — PROTIME-INR

## 2020-03-07 DIAGNOSIS — N186 End stage renal disease: Secondary | ICD-10-CM | POA: Diagnosis not present

## 2020-03-07 DIAGNOSIS — N2581 Secondary hyperparathyroidism of renal origin: Secondary | ICD-10-CM | POA: Diagnosis not present

## 2020-03-07 DIAGNOSIS — Z992 Dependence on renal dialysis: Secondary | ICD-10-CM | POA: Diagnosis not present

## 2020-03-12 ENCOUNTER — Telehealth: Payer: Self-pay | Admitting: *Deleted

## 2020-03-12 DIAGNOSIS — N186 End stage renal disease: Secondary | ICD-10-CM | POA: Diagnosis not present

## 2020-03-12 DIAGNOSIS — Z992 Dependence on renal dialysis: Secondary | ICD-10-CM | POA: Diagnosis not present

## 2020-03-12 DIAGNOSIS — N2581 Secondary hyperparathyroidism of renal origin: Secondary | ICD-10-CM | POA: Diagnosis not present

## 2020-03-12 NOTE — Telephone Encounter (Signed)
Called patient since he is overdue for an INR check and we have been trying to reach him to get rescheduled. Pt has not called [place] back after multiple attempts. Left him a message to call back to reschedule Anticoagulation Appt.

## 2020-03-14 DIAGNOSIS — Z992 Dependence on renal dialysis: Secondary | ICD-10-CM | POA: Diagnosis not present

## 2020-03-14 DIAGNOSIS — N186 End stage renal disease: Secondary | ICD-10-CM | POA: Diagnosis not present

## 2020-03-14 DIAGNOSIS — N2581 Secondary hyperparathyroidism of renal origin: Secondary | ICD-10-CM | POA: Diagnosis not present

## 2020-03-19 DIAGNOSIS — N186 End stage renal disease: Secondary | ICD-10-CM | POA: Diagnosis not present

## 2020-03-19 DIAGNOSIS — Z992 Dependence on renal dialysis: Secondary | ICD-10-CM | POA: Diagnosis not present

## 2020-03-19 DIAGNOSIS — N2581 Secondary hyperparathyroidism of renal origin: Secondary | ICD-10-CM | POA: Diagnosis not present

## 2020-03-21 DIAGNOSIS — N186 End stage renal disease: Secondary | ICD-10-CM | POA: Diagnosis not present

## 2020-03-21 DIAGNOSIS — N2581 Secondary hyperparathyroidism of renal origin: Secondary | ICD-10-CM | POA: Diagnosis not present

## 2020-03-21 DIAGNOSIS — Z992 Dependence on renal dialysis: Secondary | ICD-10-CM | POA: Diagnosis not present

## 2020-03-26 DIAGNOSIS — N186 End stage renal disease: Secondary | ICD-10-CM | POA: Diagnosis not present

## 2020-03-26 DIAGNOSIS — Z992 Dependence on renal dialysis: Secondary | ICD-10-CM | POA: Diagnosis not present

## 2020-03-26 DIAGNOSIS — N2581 Secondary hyperparathyroidism of renal origin: Secondary | ICD-10-CM | POA: Diagnosis not present

## 2020-03-29 DIAGNOSIS — N186 End stage renal disease: Secondary | ICD-10-CM | POA: Diagnosis not present

## 2020-03-29 DIAGNOSIS — N2581 Secondary hyperparathyroidism of renal origin: Secondary | ICD-10-CM | POA: Diagnosis not present

## 2020-03-29 DIAGNOSIS — Z992 Dependence on renal dialysis: Secondary | ICD-10-CM | POA: Diagnosis not present

## 2020-04-02 DIAGNOSIS — N2581 Secondary hyperparathyroidism of renal origin: Secondary | ICD-10-CM | POA: Diagnosis not present

## 2020-04-02 DIAGNOSIS — Z992 Dependence on renal dialysis: Secondary | ICD-10-CM | POA: Diagnosis not present

## 2020-04-02 DIAGNOSIS — N186 End stage renal disease: Secondary | ICD-10-CM | POA: Diagnosis not present

## 2020-04-04 DIAGNOSIS — N186 End stage renal disease: Secondary | ICD-10-CM | POA: Diagnosis not present

## 2020-04-04 DIAGNOSIS — Z992 Dependence on renal dialysis: Secondary | ICD-10-CM | POA: Diagnosis not present

## 2020-04-04 DIAGNOSIS — I129 Hypertensive chronic kidney disease with stage 1 through stage 4 chronic kidney disease, or unspecified chronic kidney disease: Secondary | ICD-10-CM | POA: Diagnosis not present

## 2020-04-07 DIAGNOSIS — N186 End stage renal disease: Secondary | ICD-10-CM | POA: Diagnosis not present

## 2020-04-07 DIAGNOSIS — Z992 Dependence on renal dialysis: Secondary | ICD-10-CM | POA: Diagnosis not present

## 2020-04-07 DIAGNOSIS — N2581 Secondary hyperparathyroidism of renal origin: Secondary | ICD-10-CM | POA: Diagnosis not present

## 2020-04-11 DIAGNOSIS — N186 End stage renal disease: Secondary | ICD-10-CM | POA: Diagnosis not present

## 2020-04-11 DIAGNOSIS — Z992 Dependence on renal dialysis: Secondary | ICD-10-CM | POA: Diagnosis not present

## 2020-04-11 DIAGNOSIS — N2581 Secondary hyperparathyroidism of renal origin: Secondary | ICD-10-CM | POA: Diagnosis not present

## 2020-04-14 DIAGNOSIS — Z992 Dependence on renal dialysis: Secondary | ICD-10-CM | POA: Diagnosis not present

## 2020-04-14 DIAGNOSIS — N186 End stage renal disease: Secondary | ICD-10-CM | POA: Diagnosis not present

## 2020-04-14 DIAGNOSIS — N2581 Secondary hyperparathyroidism of renal origin: Secondary | ICD-10-CM | POA: Diagnosis not present

## 2020-04-16 DIAGNOSIS — Z992 Dependence on renal dialysis: Secondary | ICD-10-CM | POA: Diagnosis not present

## 2020-04-16 DIAGNOSIS — N2581 Secondary hyperparathyroidism of renal origin: Secondary | ICD-10-CM | POA: Diagnosis not present

## 2020-04-16 DIAGNOSIS — N186 End stage renal disease: Secondary | ICD-10-CM | POA: Diagnosis not present

## 2020-04-21 DIAGNOSIS — N2581 Secondary hyperparathyroidism of renal origin: Secondary | ICD-10-CM | POA: Diagnosis not present

## 2020-04-21 DIAGNOSIS — N186 End stage renal disease: Secondary | ICD-10-CM | POA: Diagnosis not present

## 2020-04-21 DIAGNOSIS — Z992 Dependence on renal dialysis: Secondary | ICD-10-CM | POA: Diagnosis not present

## 2020-04-25 DIAGNOSIS — N2581 Secondary hyperparathyroidism of renal origin: Secondary | ICD-10-CM | POA: Diagnosis not present

## 2020-04-25 DIAGNOSIS — N186 End stage renal disease: Secondary | ICD-10-CM | POA: Diagnosis not present

## 2020-04-25 DIAGNOSIS — Z992 Dependence on renal dialysis: Secondary | ICD-10-CM | POA: Diagnosis not present

## 2020-04-29 ENCOUNTER — Telehealth: Payer: Self-pay | Admitting: *Deleted

## 2020-04-29 NOTE — Telephone Encounter (Signed)
Called pt since he overdue for Anticoagulation Appt; he answered and stated he has been out of town. Advised we have been calling since October 2021 so we need to get him in the Anticoagulation Clinic to be seen. He stated he would arrange transportation and come to appt on Monday, which we set for  05/04/20 at 11am.

## 2020-04-30 DIAGNOSIS — N2581 Secondary hyperparathyroidism of renal origin: Secondary | ICD-10-CM | POA: Diagnosis not present

## 2020-04-30 DIAGNOSIS — N186 End stage renal disease: Secondary | ICD-10-CM | POA: Diagnosis not present

## 2020-04-30 DIAGNOSIS — Z992 Dependence on renal dialysis: Secondary | ICD-10-CM | POA: Diagnosis not present

## 2020-05-02 DIAGNOSIS — N186 End stage renal disease: Secondary | ICD-10-CM | POA: Diagnosis not present

## 2020-05-02 DIAGNOSIS — N2581 Secondary hyperparathyroidism of renal origin: Secondary | ICD-10-CM | POA: Diagnosis not present

## 2020-05-02 DIAGNOSIS — Z992 Dependence on renal dialysis: Secondary | ICD-10-CM | POA: Diagnosis not present

## 2020-05-05 DIAGNOSIS — N186 End stage renal disease: Secondary | ICD-10-CM | POA: Diagnosis not present

## 2020-05-05 DIAGNOSIS — Z992 Dependence on renal dialysis: Secondary | ICD-10-CM | POA: Diagnosis not present

## 2020-05-05 DIAGNOSIS — I129 Hypertensive chronic kidney disease with stage 1 through stage 4 chronic kidney disease, or unspecified chronic kidney disease: Secondary | ICD-10-CM | POA: Diagnosis not present

## 2020-05-07 DIAGNOSIS — D631 Anemia in chronic kidney disease: Secondary | ICD-10-CM | POA: Diagnosis not present

## 2020-05-07 DIAGNOSIS — Z992 Dependence on renal dialysis: Secondary | ICD-10-CM | POA: Diagnosis not present

## 2020-05-07 DIAGNOSIS — N2581 Secondary hyperparathyroidism of renal origin: Secondary | ICD-10-CM | POA: Diagnosis not present

## 2020-05-07 DIAGNOSIS — N186 End stage renal disease: Secondary | ICD-10-CM | POA: Diagnosis not present

## 2020-05-07 DIAGNOSIS — D689 Coagulation defect, unspecified: Secondary | ICD-10-CM | POA: Diagnosis not present

## 2020-05-07 DIAGNOSIS — I482 Chronic atrial fibrillation, unspecified: Secondary | ICD-10-CM | POA: Diagnosis not present

## 2020-05-08 ENCOUNTER — Other Ambulatory Visit: Payer: Self-pay

## 2020-05-08 ENCOUNTER — Ambulatory Visit (INDEPENDENT_AMBULATORY_CARE_PROVIDER_SITE_OTHER): Payer: Medicare Other

## 2020-05-08 DIAGNOSIS — I4891 Unspecified atrial fibrillation: Secondary | ICD-10-CM | POA: Diagnosis not present

## 2020-05-08 DIAGNOSIS — Z7901 Long term (current) use of anticoagulants: Secondary | ICD-10-CM | POA: Diagnosis not present

## 2020-05-08 DIAGNOSIS — Z5181 Encounter for therapeutic drug level monitoring: Secondary | ICD-10-CM | POA: Diagnosis not present

## 2020-05-08 LAB — POCT INR: INR: 1.9 — AB (ref 2.0–3.0)

## 2020-05-08 MED ORDER — WARFARIN SODIUM 5 MG PO TABS: ORAL_TABLET | ORAL | 0 refills | Status: DC

## 2020-05-08 NOTE — Patient Instructions (Signed)
-   take extra 1/2 tablet tonight,  - Continue taking 1 tablet daily.  - Recheck INR in 2 weeks.  Please call Coumadin Clinic for any changes in medications or upcoming procedures. [phone].

## 2020-05-09 DIAGNOSIS — Z992 Dependence on renal dialysis: Secondary | ICD-10-CM | POA: Diagnosis not present

## 2020-05-09 DIAGNOSIS — N186 End stage renal disease: Secondary | ICD-10-CM | POA: Diagnosis not present

## 2020-05-09 DIAGNOSIS — D631 Anemia in chronic kidney disease: Secondary | ICD-10-CM | POA: Diagnosis not present

## 2020-05-09 DIAGNOSIS — N2581 Secondary hyperparathyroidism of renal origin: Secondary | ICD-10-CM | POA: Diagnosis not present

## 2020-05-09 DIAGNOSIS — D689 Coagulation defect, unspecified: Secondary | ICD-10-CM | POA: Diagnosis not present

## 2020-05-14 DIAGNOSIS — D631 Anemia in chronic kidney disease: Secondary | ICD-10-CM | POA: Diagnosis not present

## 2020-05-14 DIAGNOSIS — D689 Coagulation defect, unspecified: Secondary | ICD-10-CM | POA: Diagnosis not present

## 2020-05-14 DIAGNOSIS — N186 End stage renal disease: Secondary | ICD-10-CM | POA: Diagnosis not present

## 2020-05-14 DIAGNOSIS — Z992 Dependence on renal dialysis: Secondary | ICD-10-CM | POA: Diagnosis not present

## 2020-05-14 DIAGNOSIS — N2581 Secondary hyperparathyroidism of renal origin: Secondary | ICD-10-CM | POA: Diagnosis not present

## 2020-05-16 DIAGNOSIS — Z992 Dependence on renal dialysis: Secondary | ICD-10-CM | POA: Diagnosis not present

## 2020-05-16 DIAGNOSIS — D631 Anemia in chronic kidney disease: Secondary | ICD-10-CM | POA: Diagnosis not present

## 2020-05-16 DIAGNOSIS — N2581 Secondary hyperparathyroidism of renal origin: Secondary | ICD-10-CM | POA: Diagnosis not present

## 2020-05-16 DIAGNOSIS — D689 Coagulation defect, unspecified: Secondary | ICD-10-CM | POA: Diagnosis not present

## 2020-05-16 DIAGNOSIS — N186 End stage renal disease: Secondary | ICD-10-CM | POA: Diagnosis not present

## 2020-05-21 DIAGNOSIS — D689 Coagulation defect, unspecified: Secondary | ICD-10-CM | POA: Diagnosis not present

## 2020-05-21 DIAGNOSIS — N2581 Secondary hyperparathyroidism of renal origin: Secondary | ICD-10-CM | POA: Diagnosis not present

## 2020-05-21 DIAGNOSIS — N186 End stage renal disease: Secondary | ICD-10-CM | POA: Diagnosis not present

## 2020-05-21 DIAGNOSIS — Z992 Dependence on renal dialysis: Secondary | ICD-10-CM | POA: Diagnosis not present

## 2020-05-21 DIAGNOSIS — D631 Anemia in chronic kidney disease: Secondary | ICD-10-CM | POA: Diagnosis not present

## 2020-05-23 DIAGNOSIS — D689 Coagulation defect, unspecified: Secondary | ICD-10-CM | POA: Diagnosis not present

## 2020-05-23 DIAGNOSIS — N186 End stage renal disease: Secondary | ICD-10-CM | POA: Diagnosis not present

## 2020-05-23 DIAGNOSIS — D631 Anemia in chronic kidney disease: Secondary | ICD-10-CM | POA: Diagnosis not present

## 2020-05-23 DIAGNOSIS — Z992 Dependence on renal dialysis: Secondary | ICD-10-CM | POA: Diagnosis not present

## 2020-05-23 DIAGNOSIS — N2581 Secondary hyperparathyroidism of renal origin: Secondary | ICD-10-CM | POA: Diagnosis not present

## 2020-05-25 ENCOUNTER — Telehealth: Payer: Self-pay | Admitting: *Deleted

## 2020-05-25 NOTE — Telephone Encounter (Signed)
Called pt since he is overdue for last Anticoagulation Appt, there was no answer and I left a message for pt to call back.

## 2020-05-28 DIAGNOSIS — Z992 Dependence on renal dialysis: Secondary | ICD-10-CM | POA: Diagnosis not present

## 2020-05-28 DIAGNOSIS — D689 Coagulation defect, unspecified: Secondary | ICD-10-CM | POA: Diagnosis not present

## 2020-05-28 DIAGNOSIS — D631 Anemia in chronic kidney disease: Secondary | ICD-10-CM | POA: Diagnosis not present

## 2020-05-28 DIAGNOSIS — N186 End stage renal disease: Secondary | ICD-10-CM | POA: Diagnosis not present

## 2020-05-28 DIAGNOSIS — N2581 Secondary hyperparathyroidism of renal origin: Secondary | ICD-10-CM | POA: Diagnosis not present

## 2020-05-30 DIAGNOSIS — Z992 Dependence on renal dialysis: Secondary | ICD-10-CM | POA: Diagnosis not present

## 2020-05-30 DIAGNOSIS — N186 End stage renal disease: Secondary | ICD-10-CM | POA: Diagnosis not present

## 2020-05-30 DIAGNOSIS — D631 Anemia in chronic kidney disease: Secondary | ICD-10-CM | POA: Diagnosis not present

## 2020-05-30 DIAGNOSIS — N2581 Secondary hyperparathyroidism of renal origin: Secondary | ICD-10-CM | POA: Diagnosis not present

## 2020-05-30 DIAGNOSIS — D689 Coagulation defect, unspecified: Secondary | ICD-10-CM | POA: Diagnosis not present

## 2020-06-02 DIAGNOSIS — Z992 Dependence on renal dialysis: Secondary | ICD-10-CM | POA: Diagnosis not present

## 2020-06-02 DIAGNOSIS — I129 Hypertensive chronic kidney disease with stage 1 through stage 4 chronic kidney disease, or unspecified chronic kidney disease: Secondary | ICD-10-CM | POA: Diagnosis not present

## 2020-06-02 DIAGNOSIS — N186 End stage renal disease: Secondary | ICD-10-CM | POA: Diagnosis not present

## 2020-06-04 DIAGNOSIS — N2581 Secondary hyperparathyroidism of renal origin: Secondary | ICD-10-CM | POA: Diagnosis not present

## 2020-06-04 DIAGNOSIS — D689 Coagulation defect, unspecified: Secondary | ICD-10-CM | POA: Diagnosis not present

## 2020-06-04 DIAGNOSIS — Z992 Dependence on renal dialysis: Secondary | ICD-10-CM | POA: Diagnosis not present

## 2020-06-04 DIAGNOSIS — N186 End stage renal disease: Secondary | ICD-10-CM | POA: Diagnosis not present

## 2020-06-04 DIAGNOSIS — I482 Chronic atrial fibrillation, unspecified: Secondary | ICD-10-CM | POA: Diagnosis not present

## 2020-06-05 ENCOUNTER — Telehealth: Payer: Self-pay | Admitting: *Deleted

## 2020-06-05 NOTE — Telephone Encounter (Signed)
Called pt since he is overdue for his Anticoagulation Appt and we have reached out multiple times without success; there was no answer so left a message to call back. Last seen on 05/08/2020 and was due back on 05/22/2020.

## 2020-06-06 DIAGNOSIS — D689 Coagulation defect, unspecified: Secondary | ICD-10-CM | POA: Diagnosis not present

## 2020-06-06 DIAGNOSIS — N186 End stage renal disease: Secondary | ICD-10-CM | POA: Diagnosis not present

## 2020-06-06 DIAGNOSIS — Z992 Dependence on renal dialysis: Secondary | ICD-10-CM | POA: Diagnosis not present

## 2020-06-06 DIAGNOSIS — N2581 Secondary hyperparathyroidism of renal origin: Secondary | ICD-10-CM | POA: Diagnosis not present

## 2020-06-11 DIAGNOSIS — N186 End stage renal disease: Secondary | ICD-10-CM | POA: Diagnosis not present

## 2020-06-11 DIAGNOSIS — Z992 Dependence on renal dialysis: Secondary | ICD-10-CM | POA: Diagnosis not present

## 2020-06-11 DIAGNOSIS — N2581 Secondary hyperparathyroidism of renal origin: Secondary | ICD-10-CM | POA: Diagnosis not present

## 2020-06-11 DIAGNOSIS — D689 Coagulation defect, unspecified: Secondary | ICD-10-CM | POA: Diagnosis not present

## 2020-06-13 DIAGNOSIS — N2581 Secondary hyperparathyroidism of renal origin: Secondary | ICD-10-CM | POA: Diagnosis not present

## 2020-06-13 DIAGNOSIS — N186 End stage renal disease: Secondary | ICD-10-CM | POA: Diagnosis not present

## 2020-06-13 DIAGNOSIS — D689 Coagulation defect, unspecified: Secondary | ICD-10-CM | POA: Diagnosis not present

## 2020-06-13 DIAGNOSIS — Z992 Dependence on renal dialysis: Secondary | ICD-10-CM | POA: Diagnosis not present

## 2020-06-18 ENCOUNTER — Telehealth: Payer: Self-pay | Admitting: *Deleted

## 2020-06-18 DIAGNOSIS — Z992 Dependence on renal dialysis: Secondary | ICD-10-CM | POA: Diagnosis not present

## 2020-06-18 DIAGNOSIS — N186 End stage renal disease: Secondary | ICD-10-CM | POA: Diagnosis not present

## 2020-06-18 DIAGNOSIS — D689 Coagulation defect, unspecified: Secondary | ICD-10-CM | POA: Diagnosis not present

## 2020-06-18 DIAGNOSIS — N2581 Secondary hyperparathyroidism of renal origin: Secondary | ICD-10-CM | POA: Diagnosis not present

## 2020-06-18 NOTE — Telephone Encounter (Signed)
Pt is overdue to have INR check. Called pt and LMOM for him to call coumadin clinic to schedule an appointment.

## 2020-06-20 DIAGNOSIS — N2581 Secondary hyperparathyroidism of renal origin: Secondary | ICD-10-CM | POA: Diagnosis not present

## 2020-06-20 DIAGNOSIS — Z992 Dependence on renal dialysis: Secondary | ICD-10-CM | POA: Diagnosis not present

## 2020-06-20 DIAGNOSIS — N186 End stage renal disease: Secondary | ICD-10-CM | POA: Diagnosis not present

## 2020-06-20 DIAGNOSIS — D689 Coagulation defect, unspecified: Secondary | ICD-10-CM | POA: Diagnosis not present

## 2020-06-25 DIAGNOSIS — Z992 Dependence on renal dialysis: Secondary | ICD-10-CM | POA: Diagnosis not present

## 2020-06-25 DIAGNOSIS — N186 End stage renal disease: Secondary | ICD-10-CM | POA: Diagnosis not present

## 2020-06-25 DIAGNOSIS — N2581 Secondary hyperparathyroidism of renal origin: Secondary | ICD-10-CM | POA: Diagnosis not present

## 2020-06-25 DIAGNOSIS — D689 Coagulation defect, unspecified: Secondary | ICD-10-CM | POA: Diagnosis not present

## 2020-06-27 DIAGNOSIS — N186 End stage renal disease: Secondary | ICD-10-CM | POA: Diagnosis not present

## 2020-06-27 DIAGNOSIS — N2581 Secondary hyperparathyroidism of renal origin: Secondary | ICD-10-CM | POA: Diagnosis not present

## 2020-06-27 DIAGNOSIS — Z992 Dependence on renal dialysis: Secondary | ICD-10-CM | POA: Diagnosis not present

## 2020-06-27 DIAGNOSIS — D689 Coagulation defect, unspecified: Secondary | ICD-10-CM | POA: Diagnosis not present

## 2020-06-29 DIAGNOSIS — Z736 Limitation of activities due to disability: Secondary | ICD-10-CM

## 2020-07-02 ENCOUNTER — Telehealth: Payer: Self-pay | Admitting: *Deleted

## 2020-07-02 DIAGNOSIS — N186 End stage renal disease: Secondary | ICD-10-CM | POA: Diagnosis not present

## 2020-07-02 DIAGNOSIS — D689 Coagulation defect, unspecified: Secondary | ICD-10-CM | POA: Diagnosis not present

## 2020-07-02 DIAGNOSIS — Z992 Dependence on renal dialysis: Secondary | ICD-10-CM | POA: Diagnosis not present

## 2020-07-02 DIAGNOSIS — N2581 Secondary hyperparathyroidism of renal origin: Secondary | ICD-10-CM | POA: Diagnosis not present

## 2020-07-02 NOTE — Telephone Encounter (Signed)
Pt is overdue to have INR check. Called and LMOM to call coumadin clinic back to schedule an appointment.

## 2020-07-04 DIAGNOSIS — N2581 Secondary hyperparathyroidism of renal origin: Secondary | ICD-10-CM | POA: Diagnosis not present

## 2020-07-04 DIAGNOSIS — N186 End stage renal disease: Secondary | ICD-10-CM | POA: Diagnosis not present

## 2020-07-04 DIAGNOSIS — Z992 Dependence on renal dialysis: Secondary | ICD-10-CM | POA: Diagnosis not present

## 2020-07-07 ENCOUNTER — Other Ambulatory Visit: Payer: Self-pay | Admitting: *Deleted

## 2020-07-07 DIAGNOSIS — I7101 Dissection of thoracic aorta: Secondary | ICD-10-CM

## 2020-07-07 DIAGNOSIS — I71019 Dissection of thoracic aorta, unspecified: Secondary | ICD-10-CM

## 2020-07-09 DIAGNOSIS — Z992 Dependence on renal dialysis: Secondary | ICD-10-CM | POA: Diagnosis not present

## 2020-07-09 DIAGNOSIS — I482 Chronic atrial fibrillation, unspecified: Secondary | ICD-10-CM | POA: Diagnosis not present

## 2020-07-09 DIAGNOSIS — N186 End stage renal disease: Secondary | ICD-10-CM | POA: Diagnosis not present

## 2020-07-09 DIAGNOSIS — N2581 Secondary hyperparathyroidism of renal origin: Secondary | ICD-10-CM | POA: Diagnosis not present

## 2020-07-11 DIAGNOSIS — Z992 Dependence on renal dialysis: Secondary | ICD-10-CM | POA: Diagnosis not present

## 2020-07-11 DIAGNOSIS — N186 End stage renal disease: Secondary | ICD-10-CM | POA: Diagnosis not present

## 2020-07-11 DIAGNOSIS — N2581 Secondary hyperparathyroidism of renal origin: Secondary | ICD-10-CM | POA: Diagnosis not present

## 2020-07-16 DIAGNOSIS — N2581 Secondary hyperparathyroidism of renal origin: Secondary | ICD-10-CM | POA: Diagnosis not present

## 2020-07-16 DIAGNOSIS — Z992 Dependence on renal dialysis: Secondary | ICD-10-CM | POA: Diagnosis not present

## 2020-07-16 DIAGNOSIS — N186 End stage renal disease: Secondary | ICD-10-CM | POA: Diagnosis not present

## 2020-07-17 ENCOUNTER — Telehealth: Payer: Self-pay

## 2020-07-17 NOTE — Telephone Encounter (Signed)
Called pt, he is overdue for follow-up INR check.  LMOM TCB for appt.

## 2020-07-18 DIAGNOSIS — N186 End stage renal disease: Secondary | ICD-10-CM | POA: Diagnosis not present

## 2020-07-18 DIAGNOSIS — N2581 Secondary hyperparathyroidism of renal origin: Secondary | ICD-10-CM | POA: Diagnosis not present

## 2020-07-18 DIAGNOSIS — Z992 Dependence on renal dialysis: Secondary | ICD-10-CM | POA: Diagnosis not present

## 2020-07-23 DIAGNOSIS — Z992 Dependence on renal dialysis: Secondary | ICD-10-CM | POA: Diagnosis not present

## 2020-07-23 DIAGNOSIS — N2581 Secondary hyperparathyroidism of renal origin: Secondary | ICD-10-CM | POA: Diagnosis not present

## 2020-07-23 DIAGNOSIS — N186 End stage renal disease: Secondary | ICD-10-CM | POA: Diagnosis not present

## 2020-07-25 DIAGNOSIS — Z992 Dependence on renal dialysis: Secondary | ICD-10-CM | POA: Diagnosis not present

## 2020-07-25 DIAGNOSIS — N186 End stage renal disease: Secondary | ICD-10-CM | POA: Diagnosis not present

## 2020-07-25 DIAGNOSIS — N2581 Secondary hyperparathyroidism of renal origin: Secondary | ICD-10-CM | POA: Diagnosis not present

## 2020-07-27 ENCOUNTER — Ambulatory Visit (INDEPENDENT_AMBULATORY_CARE_PROVIDER_SITE_OTHER): Payer: Medicare Other

## 2020-07-27 ENCOUNTER — Other Ambulatory Visit: Payer: Self-pay

## 2020-07-27 DIAGNOSIS — Z7901 Long term (current) use of anticoagulants: Secondary | ICD-10-CM

## 2020-07-27 DIAGNOSIS — Z5181 Encounter for therapeutic drug level monitoring: Secondary | ICD-10-CM

## 2020-07-27 DIAGNOSIS — I4891 Unspecified atrial fibrillation: Secondary | ICD-10-CM

## 2020-07-27 LAB — POCT INR: INR: 3.5 — AB (ref 2.0–3.0)

## 2020-07-27 MED ORDER — WARFARIN SODIUM 5 MG PO TABS: ORAL_TABLET | ORAL | 0 refills | Status: DC

## 2020-07-27 NOTE — Patient Instructions (Signed)
-   skip warfarin tonight - Continue taking 1 tablet daily.  - Recheck INR in 2 weeks.  Please call Coumadin Clinic for any changes in medications or upcoming procedures. [phone].

## 2020-07-30 DIAGNOSIS — N186 End stage renal disease: Secondary | ICD-10-CM | POA: Diagnosis not present

## 2020-07-30 DIAGNOSIS — N2581 Secondary hyperparathyroidism of renal origin: Secondary | ICD-10-CM | POA: Diagnosis not present

## 2020-07-30 DIAGNOSIS — Z992 Dependence on renal dialysis: Secondary | ICD-10-CM | POA: Diagnosis not present

## 2020-08-01 DIAGNOSIS — Z992 Dependence on renal dialysis: Secondary | ICD-10-CM | POA: Diagnosis not present

## 2020-08-01 DIAGNOSIS — N2581 Secondary hyperparathyroidism of renal origin: Secondary | ICD-10-CM | POA: Diagnosis not present

## 2020-08-01 DIAGNOSIS — N186 End stage renal disease: Secondary | ICD-10-CM | POA: Diagnosis not present

## 2020-08-02 DIAGNOSIS — N186 End stage renal disease: Secondary | ICD-10-CM | POA: Diagnosis not present

## 2020-08-02 DIAGNOSIS — Z992 Dependence on renal dialysis: Secondary | ICD-10-CM | POA: Diagnosis not present

## 2020-08-02 DIAGNOSIS — I129 Hypertensive chronic kidney disease with stage 1 through stage 4 chronic kidney disease, or unspecified chronic kidney disease: Secondary | ICD-10-CM | POA: Diagnosis not present

## 2020-08-03 ENCOUNTER — Telehealth: Payer: Self-pay | Admitting: *Deleted

## 2020-08-03 MED ORDER — AMIODARONE HCL 200 MG PO TABS
200.0000 mg | ORAL_TABLET | Freq: Every day | ORAL | 1 refills | Status: DC
Start: 2020-08-03 — End: 2020-10-14

## 2020-08-03 NOTE — Telephone Encounter (Signed)
Pt would like a refill of his amio sent to [facility] on [address].

## 2020-08-04 DIAGNOSIS — N2581 Secondary hyperparathyroidism of renal origin: Secondary | ICD-10-CM | POA: Diagnosis not present

## 2020-08-04 DIAGNOSIS — N186 End stage renal disease: Secondary | ICD-10-CM | POA: Diagnosis not present

## 2020-08-04 DIAGNOSIS — Z992 Dependence on renal dialysis: Secondary | ICD-10-CM | POA: Diagnosis not present

## 2020-08-06 ENCOUNTER — Ambulatory Visit
Admission: RE | Admit: 2020-08-06 | Discharge: 2020-08-06 | Disposition: A | Discharge status: Home / Self Care | Payer: Medicare Other | Source: Ambulatory Visit | Attending: Cardiothoracic Surgery | Admitting: Cardiothoracic Surgery

## 2020-08-06 ENCOUNTER — Encounter: Payer: Medicare Other | Admitting: Cardiothoracic Surgery

## 2020-08-06 ENCOUNTER — Other Ambulatory Visit: Payer: Self-pay

## 2020-08-06 DIAGNOSIS — I7102 Dissection of abdominal aorta: Secondary | ICD-10-CM | POA: Diagnosis not present

## 2020-08-06 DIAGNOSIS — I712 Thoracic aortic aneurysm, without rupture: Secondary | ICD-10-CM | POA: Diagnosis not present

## 2020-08-06 DIAGNOSIS — I71019 Dissection of thoracic aorta, unspecified: Secondary | ICD-10-CM

## 2020-08-06 DIAGNOSIS — I7101 Dissection of thoracic aorta: Secondary | ICD-10-CM

## 2020-08-06 DIAGNOSIS — I2510 Atherosclerotic heart disease of native coronary artery without angina pectoris: Secondary | ICD-10-CM | POA: Diagnosis not present

## 2020-08-06 MED ORDER — IOPAMIDOL (ISOVUE-370) INJECTION 76%
75.0000 mL | Freq: Once | INTRAVENOUS | Status: AC | PRN
  Administered 2020-08-06: 75 mL via INTRAVENOUS

## 2020-08-08 DIAGNOSIS — N2581 Secondary hyperparathyroidism of renal origin: Secondary | ICD-10-CM | POA: Diagnosis not present

## 2020-08-08 DIAGNOSIS — N186 End stage renal disease: Secondary | ICD-10-CM | POA: Diagnosis not present

## 2020-08-08 DIAGNOSIS — Z992 Dependence on renal dialysis: Secondary | ICD-10-CM | POA: Diagnosis not present

## 2020-08-08 DIAGNOSIS — I482 Chronic atrial fibrillation, unspecified: Secondary | ICD-10-CM | POA: Diagnosis not present

## 2020-08-13 DIAGNOSIS — N2581 Secondary hyperparathyroidism of renal origin: Secondary | ICD-10-CM | POA: Diagnosis not present

## 2020-08-13 DIAGNOSIS — Z992 Dependence on renal dialysis: Secondary | ICD-10-CM | POA: Diagnosis not present

## 2020-08-13 DIAGNOSIS — N186 End stage renal disease: Secondary | ICD-10-CM | POA: Diagnosis not present

## 2020-08-15 DIAGNOSIS — Z992 Dependence on renal dialysis: Secondary | ICD-10-CM | POA: Diagnosis not present

## 2020-08-15 DIAGNOSIS — N186 End stage renal disease: Secondary | ICD-10-CM | POA: Diagnosis not present

## 2020-08-15 DIAGNOSIS — N2581 Secondary hyperparathyroidism of renal origin: Secondary | ICD-10-CM | POA: Diagnosis not present

## 2020-08-19 ENCOUNTER — Telehealth: Payer: Self-pay | Admitting: *Deleted

## 2020-08-19 NOTE — Telephone Encounter (Signed)
Called pt since he is overdue for his Anticoagulation Appt. There was no answer so left a message for the pt to call back to get rescheduled. Will await a call back.

## 2020-08-20 ENCOUNTER — Ambulatory Visit: Payer: Medicare Other | Admitting: Cardiothoracic Surgery

## 2020-08-20 DIAGNOSIS — N2581 Secondary hyperparathyroidism of renal origin: Secondary | ICD-10-CM | POA: Diagnosis not present

## 2020-08-20 DIAGNOSIS — Z992 Dependence on renal dialysis: Secondary | ICD-10-CM | POA: Diagnosis not present

## 2020-08-20 DIAGNOSIS — N186 End stage renal disease: Secondary | ICD-10-CM | POA: Diagnosis not present

## 2020-08-22 DIAGNOSIS — N2581 Secondary hyperparathyroidism of renal origin: Secondary | ICD-10-CM | POA: Diagnosis not present

## 2020-08-22 DIAGNOSIS — Z992 Dependence on renal dialysis: Secondary | ICD-10-CM | POA: Diagnosis not present

## 2020-08-22 DIAGNOSIS — N186 End stage renal disease: Secondary | ICD-10-CM | POA: Diagnosis not present

## 2020-08-27 DIAGNOSIS — N186 End stage renal disease: Secondary | ICD-10-CM | POA: Diagnosis not present

## 2020-08-27 DIAGNOSIS — Z992 Dependence on renal dialysis: Secondary | ICD-10-CM | POA: Diagnosis not present

## 2020-08-27 DIAGNOSIS — N2581 Secondary hyperparathyroidism of renal origin: Secondary | ICD-10-CM | POA: Diagnosis not present

## 2020-08-29 DIAGNOSIS — Z992 Dependence on renal dialysis: Secondary | ICD-10-CM | POA: Diagnosis not present

## 2020-08-29 DIAGNOSIS — N2581 Secondary hyperparathyroidism of renal origin: Secondary | ICD-10-CM | POA: Diagnosis not present

## 2020-08-29 DIAGNOSIS — N186 End stage renal disease: Secondary | ICD-10-CM | POA: Diagnosis not present

## 2020-09-02 DIAGNOSIS — I129 Hypertensive chronic kidney disease with stage 1 through stage 4 chronic kidney disease, or unspecified chronic kidney disease: Secondary | ICD-10-CM | POA: Diagnosis not present

## 2020-09-02 DIAGNOSIS — N186 End stage renal disease: Secondary | ICD-10-CM | POA: Diagnosis not present

## 2020-09-02 DIAGNOSIS — Z992 Dependence on renal dialysis: Secondary | ICD-10-CM | POA: Diagnosis not present

## 2020-09-03 DIAGNOSIS — I482 Chronic atrial fibrillation, unspecified: Secondary | ICD-10-CM | POA: Diagnosis not present

## 2020-09-03 DIAGNOSIS — D509 Iron deficiency anemia, unspecified: Secondary | ICD-10-CM | POA: Diagnosis not present

## 2020-09-03 DIAGNOSIS — D631 Anemia in chronic kidney disease: Secondary | ICD-10-CM | POA: Diagnosis not present

## 2020-09-03 DIAGNOSIS — Z736 Limitation of activities due to disability: Secondary | ICD-10-CM

## 2020-09-03 DIAGNOSIS — Z992 Dependence on renal dialysis: Secondary | ICD-10-CM | POA: Diagnosis not present

## 2020-09-03 DIAGNOSIS — N186 End stage renal disease: Secondary | ICD-10-CM | POA: Diagnosis not present

## 2020-09-03 DIAGNOSIS — N2581 Secondary hyperparathyroidism of renal origin: Secondary | ICD-10-CM | POA: Diagnosis not present

## 2020-09-05 DIAGNOSIS — Z992 Dependence on renal dialysis: Secondary | ICD-10-CM | POA: Diagnosis not present

## 2020-09-05 DIAGNOSIS — D509 Iron deficiency anemia, unspecified: Secondary | ICD-10-CM | POA: Diagnosis not present

## 2020-09-05 DIAGNOSIS — D631 Anemia in chronic kidney disease: Secondary | ICD-10-CM | POA: Diagnosis not present

## 2020-09-05 DIAGNOSIS — N186 End stage renal disease: Secondary | ICD-10-CM | POA: Diagnosis not present

## 2020-09-05 DIAGNOSIS — N2581 Secondary hyperparathyroidism of renal origin: Secondary | ICD-10-CM | POA: Diagnosis not present

## 2020-09-10 ENCOUNTER — Encounter: Payer: Medicare Other | Admitting: Cardiothoracic Surgery

## 2020-09-10 ENCOUNTER — Telehealth: Payer: Self-pay | Admitting: *Deleted

## 2020-09-10 DIAGNOSIS — N2581 Secondary hyperparathyroidism of renal origin: Secondary | ICD-10-CM | POA: Diagnosis not present

## 2020-09-10 DIAGNOSIS — D509 Iron deficiency anemia, unspecified: Secondary | ICD-10-CM | POA: Diagnosis not present

## 2020-09-10 DIAGNOSIS — D631 Anemia in chronic kidney disease: Secondary | ICD-10-CM | POA: Diagnosis not present

## 2020-09-10 DIAGNOSIS — N186 End stage renal disease: Secondary | ICD-10-CM | POA: Diagnosis not present

## 2020-09-10 DIAGNOSIS — Z992 Dependence on renal dialysis: Secondary | ICD-10-CM | POA: Diagnosis not present

## 2020-09-10 NOTE — Telephone Encounter (Signed)
Called pt since he is overdue for Anticoagulation Clinic Appt; left another message for the pt to call back.

## 2020-09-12 DIAGNOSIS — Z992 Dependence on renal dialysis: Secondary | ICD-10-CM | POA: Diagnosis not present

## 2020-09-12 DIAGNOSIS — N186 End stage renal disease: Secondary | ICD-10-CM | POA: Diagnosis not present

## 2020-09-12 DIAGNOSIS — D631 Anemia in chronic kidney disease: Secondary | ICD-10-CM | POA: Diagnosis not present

## 2020-09-12 DIAGNOSIS — D509 Iron deficiency anemia, unspecified: Secondary | ICD-10-CM | POA: Diagnosis not present

## 2020-09-12 DIAGNOSIS — N2581 Secondary hyperparathyroidism of renal origin: Secondary | ICD-10-CM | POA: Diagnosis not present

## 2020-09-17 DIAGNOSIS — Z992 Dependence on renal dialysis: Secondary | ICD-10-CM | POA: Diagnosis not present

## 2020-09-17 DIAGNOSIS — N186 End stage renal disease: Secondary | ICD-10-CM | POA: Diagnosis not present

## 2020-09-17 DIAGNOSIS — N2581 Secondary hyperparathyroidism of renal origin: Secondary | ICD-10-CM | POA: Diagnosis not present

## 2020-09-17 DIAGNOSIS — D631 Anemia in chronic kidney disease: Secondary | ICD-10-CM | POA: Diagnosis not present

## 2020-09-17 DIAGNOSIS — D509 Iron deficiency anemia, unspecified: Secondary | ICD-10-CM | POA: Diagnosis not present

## 2020-09-19 DIAGNOSIS — N2581 Secondary hyperparathyroidism of renal origin: Secondary | ICD-10-CM | POA: Diagnosis not present

## 2020-09-19 DIAGNOSIS — N186 End stage renal disease: Secondary | ICD-10-CM | POA: Diagnosis not present

## 2020-09-19 DIAGNOSIS — Z992 Dependence on renal dialysis: Secondary | ICD-10-CM | POA: Diagnosis not present

## 2020-09-19 DIAGNOSIS — D509 Iron deficiency anemia, unspecified: Secondary | ICD-10-CM | POA: Diagnosis not present

## 2020-09-19 DIAGNOSIS — D631 Anemia in chronic kidney disease: Secondary | ICD-10-CM | POA: Diagnosis not present

## 2020-09-24 DIAGNOSIS — D631 Anemia in chronic kidney disease: Secondary | ICD-10-CM | POA: Diagnosis not present

## 2020-09-24 DIAGNOSIS — N186 End stage renal disease: Secondary | ICD-10-CM | POA: Diagnosis not present

## 2020-09-24 DIAGNOSIS — Z992 Dependence on renal dialysis: Secondary | ICD-10-CM | POA: Diagnosis not present

## 2020-09-24 DIAGNOSIS — D509 Iron deficiency anemia, unspecified: Secondary | ICD-10-CM | POA: Diagnosis not present

## 2020-09-24 DIAGNOSIS — N2581 Secondary hyperparathyroidism of renal origin: Secondary | ICD-10-CM | POA: Diagnosis not present

## 2020-09-26 DIAGNOSIS — Z992 Dependence on renal dialysis: Secondary | ICD-10-CM | POA: Diagnosis not present

## 2020-09-26 DIAGNOSIS — N186 End stage renal disease: Secondary | ICD-10-CM | POA: Diagnosis not present

## 2020-09-26 DIAGNOSIS — D631 Anemia in chronic kidney disease: Secondary | ICD-10-CM | POA: Diagnosis not present

## 2020-09-26 DIAGNOSIS — N2581 Secondary hyperparathyroidism of renal origin: Secondary | ICD-10-CM | POA: Diagnosis not present

## 2020-09-26 DIAGNOSIS — D509 Iron deficiency anemia, unspecified: Secondary | ICD-10-CM | POA: Diagnosis not present

## 2020-10-01 DIAGNOSIS — D509 Iron deficiency anemia, unspecified: Secondary | ICD-10-CM | POA: Diagnosis not present

## 2020-10-01 DIAGNOSIS — N186 End stage renal disease: Secondary | ICD-10-CM | POA: Diagnosis not present

## 2020-10-01 DIAGNOSIS — D631 Anemia in chronic kidney disease: Secondary | ICD-10-CM | POA: Diagnosis not present

## 2020-10-01 DIAGNOSIS — Z992 Dependence on renal dialysis: Secondary | ICD-10-CM | POA: Diagnosis not present

## 2020-10-01 DIAGNOSIS — N2581 Secondary hyperparathyroidism of renal origin: Secondary | ICD-10-CM | POA: Diagnosis not present

## 2020-10-03 DIAGNOSIS — N2581 Secondary hyperparathyroidism of renal origin: Secondary | ICD-10-CM | POA: Diagnosis not present

## 2020-10-03 DIAGNOSIS — D509 Iron deficiency anemia, unspecified: Secondary | ICD-10-CM | POA: Diagnosis not present

## 2020-10-03 DIAGNOSIS — D631 Anemia in chronic kidney disease: Secondary | ICD-10-CM | POA: Diagnosis not present

## 2020-10-03 DIAGNOSIS — N186 End stage renal disease: Secondary | ICD-10-CM | POA: Diagnosis not present

## 2020-10-03 DIAGNOSIS — Z992 Dependence on renal dialysis: Secondary | ICD-10-CM | POA: Diagnosis not present

## 2020-10-08 ENCOUNTER — Encounter: Payer: Medicare Other | Admitting: Cardiothoracic Surgery

## 2020-10-08 DIAGNOSIS — D631 Anemia in chronic kidney disease: Secondary | ICD-10-CM | POA: Diagnosis not present

## 2020-10-08 DIAGNOSIS — Z992 Dependence on renal dialysis: Secondary | ICD-10-CM | POA: Diagnosis not present

## 2020-10-08 DIAGNOSIS — N2581 Secondary hyperparathyroidism of renal origin: Secondary | ICD-10-CM | POA: Diagnosis not present

## 2020-10-08 DIAGNOSIS — D509 Iron deficiency anemia, unspecified: Secondary | ICD-10-CM | POA: Diagnosis not present

## 2020-10-08 DIAGNOSIS — N186 End stage renal disease: Secondary | ICD-10-CM | POA: Diagnosis not present

## 2020-10-08 DIAGNOSIS — I482 Chronic atrial fibrillation, unspecified: Secondary | ICD-10-CM | POA: Diagnosis not present

## 2020-10-10 DIAGNOSIS — N186 End stage renal disease: Secondary | ICD-10-CM | POA: Diagnosis not present

## 2020-10-10 DIAGNOSIS — N2581 Secondary hyperparathyroidism of renal origin: Secondary | ICD-10-CM | POA: Diagnosis not present

## 2020-10-10 DIAGNOSIS — D631 Anemia in chronic kidney disease: Secondary | ICD-10-CM | POA: Diagnosis not present

## 2020-10-10 DIAGNOSIS — Z992 Dependence on renal dialysis: Secondary | ICD-10-CM | POA: Diagnosis not present

## 2020-10-10 DIAGNOSIS — D509 Iron deficiency anemia, unspecified: Secondary | ICD-10-CM | POA: Diagnosis not present

## 2020-10-14 ENCOUNTER — Other Ambulatory Visit: Payer: Self-pay | Admitting: *Deleted

## 2020-10-14 ENCOUNTER — Ambulatory Visit (INDEPENDENT_AMBULATORY_CARE_PROVIDER_SITE_OTHER): Payer: Medicare Other | Admitting: *Deleted

## 2020-10-14 ENCOUNTER — Other Ambulatory Visit: Payer: Self-pay

## 2020-10-14 DIAGNOSIS — I4891 Unspecified atrial fibrillation: Secondary | ICD-10-CM | POA: Diagnosis not present

## 2020-10-14 DIAGNOSIS — Z5181 Encounter for therapeutic drug level monitoring: Secondary | ICD-10-CM | POA: Diagnosis not present

## 2020-10-14 LAB — POCT INR: INR: 3.3 — AB (ref 2.0–3.0)

## 2020-10-14 MED ORDER — WARFARIN SODIUM 5 MG PO TABS: ORAL_TABLET | ORAL | 0 refills | Status: DC

## 2020-10-14 MED ORDER — AMIODARONE HCL 200 MG PO TABS
200.0000 mg | ORAL_TABLET | Freq: Every day | ORAL | 0 refills | Status: DC
Start: 2020-10-14 — End: 2020-11-11

## 2020-10-14 NOTE — Patient Instructions (Signed)
Description   Hold warfarin today. Then START taking warfarin 1 tablet daily except for 1/2 a tablet on Wednesdays. Recheck INR in 2 weeks. Coumadin Clinic [phone].

## 2020-10-14 NOTE — Telephone Encounter (Signed)
Pt requesting a refill of Amiodarone

## 2020-10-15 DIAGNOSIS — Z992 Dependence on renal dialysis: Secondary | ICD-10-CM | POA: Diagnosis not present

## 2020-10-15 DIAGNOSIS — D631 Anemia in chronic kidney disease: Secondary | ICD-10-CM | POA: Diagnosis not present

## 2020-10-15 DIAGNOSIS — N186 End stage renal disease: Secondary | ICD-10-CM | POA: Diagnosis not present

## 2020-10-15 DIAGNOSIS — D509 Iron deficiency anemia, unspecified: Secondary | ICD-10-CM | POA: Diagnosis not present

## 2020-10-15 DIAGNOSIS — N2581 Secondary hyperparathyroidism of renal origin: Secondary | ICD-10-CM | POA: Diagnosis not present

## 2020-10-17 DIAGNOSIS — D509 Iron deficiency anemia, unspecified: Secondary | ICD-10-CM | POA: Diagnosis not present

## 2020-10-17 DIAGNOSIS — N186 End stage renal disease: Secondary | ICD-10-CM | POA: Diagnosis not present

## 2020-10-17 DIAGNOSIS — D631 Anemia in chronic kidney disease: Secondary | ICD-10-CM | POA: Diagnosis not present

## 2020-10-17 DIAGNOSIS — Z992 Dependence on renal dialysis: Secondary | ICD-10-CM | POA: Diagnosis not present

## 2020-10-17 DIAGNOSIS — N2581 Secondary hyperparathyroidism of renal origin: Secondary | ICD-10-CM | POA: Diagnosis not present

## 2020-10-20 DIAGNOSIS — Z992 Dependence on renal dialysis: Secondary | ICD-10-CM | POA: Diagnosis not present

## 2020-10-20 DIAGNOSIS — D509 Iron deficiency anemia, unspecified: Secondary | ICD-10-CM | POA: Diagnosis not present

## 2020-10-20 DIAGNOSIS — N186 End stage renal disease: Secondary | ICD-10-CM | POA: Diagnosis not present

## 2020-10-20 DIAGNOSIS — N2581 Secondary hyperparathyroidism of renal origin: Secondary | ICD-10-CM | POA: Diagnosis not present

## 2020-10-20 DIAGNOSIS — D631 Anemia in chronic kidney disease: Secondary | ICD-10-CM | POA: Diagnosis not present

## 2020-10-22 ENCOUNTER — Ambulatory Visit (INDEPENDENT_AMBULATORY_CARE_PROVIDER_SITE_OTHER): Payer: Medicare Other | Admitting: Cardiothoracic Surgery

## 2020-10-22 ENCOUNTER — Other Ambulatory Visit: Payer: Self-pay

## 2020-10-22 VITALS — BP 118/73 | HR 74 | Resp 20 | Ht 74.0 in

## 2020-10-22 DIAGNOSIS — I7101 Dissection of thoracic aorta: Secondary | ICD-10-CM | POA: Diagnosis not present

## 2020-10-22 DIAGNOSIS — I71019 Dissection of thoracic aorta, unspecified: Secondary | ICD-10-CM

## 2020-10-24 DIAGNOSIS — N186 End stage renal disease: Secondary | ICD-10-CM | POA: Diagnosis not present

## 2020-10-24 DIAGNOSIS — Z992 Dependence on renal dialysis: Secondary | ICD-10-CM | POA: Diagnosis not present

## 2020-10-24 DIAGNOSIS — D509 Iron deficiency anemia, unspecified: Secondary | ICD-10-CM | POA: Diagnosis not present

## 2020-10-24 DIAGNOSIS — D631 Anemia in chronic kidney disease: Secondary | ICD-10-CM | POA: Diagnosis not present

## 2020-10-24 DIAGNOSIS — N2581 Secondary hyperparathyroidism of renal origin: Secondary | ICD-10-CM | POA: Diagnosis not present

## 2020-10-29 DIAGNOSIS — D509 Iron deficiency anemia, unspecified: Secondary | ICD-10-CM | POA: Diagnosis not present

## 2020-10-29 DIAGNOSIS — Z992 Dependence on renal dialysis: Secondary | ICD-10-CM | POA: Diagnosis not present

## 2020-10-29 DIAGNOSIS — D631 Anemia in chronic kidney disease: Secondary | ICD-10-CM | POA: Diagnosis not present

## 2020-10-29 DIAGNOSIS — N2581 Secondary hyperparathyroidism of renal origin: Secondary | ICD-10-CM | POA: Diagnosis not present

## 2020-10-29 DIAGNOSIS — N186 End stage renal disease: Secondary | ICD-10-CM | POA: Diagnosis not present

## 2020-10-31 DIAGNOSIS — N2581 Secondary hyperparathyroidism of renal origin: Secondary | ICD-10-CM | POA: Diagnosis not present

## 2020-10-31 DIAGNOSIS — Z992 Dependence on renal dialysis: Secondary | ICD-10-CM | POA: Diagnosis not present

## 2020-10-31 DIAGNOSIS — D509 Iron deficiency anemia, unspecified: Secondary | ICD-10-CM | POA: Diagnosis not present

## 2020-10-31 DIAGNOSIS — N186 End stage renal disease: Secondary | ICD-10-CM | POA: Diagnosis not present

## 2020-10-31 DIAGNOSIS — D631 Anemia in chronic kidney disease: Secondary | ICD-10-CM | POA: Diagnosis not present

## 2020-11-02 DIAGNOSIS — I129 Hypertensive chronic kidney disease with stage 1 through stage 4 chronic kidney disease, or unspecified chronic kidney disease: Secondary | ICD-10-CM | POA: Diagnosis not present

## 2020-11-02 DIAGNOSIS — Z992 Dependence on renal dialysis: Secondary | ICD-10-CM | POA: Diagnosis not present

## 2020-11-02 DIAGNOSIS — N186 End stage renal disease: Secondary | ICD-10-CM | POA: Diagnosis not present

## 2020-11-05 DIAGNOSIS — N186 End stage renal disease: Secondary | ICD-10-CM | POA: Diagnosis not present

## 2020-11-05 DIAGNOSIS — Z992 Dependence on renal dialysis: Secondary | ICD-10-CM | POA: Diagnosis not present

## 2020-11-05 DIAGNOSIS — N2581 Secondary hyperparathyroidism of renal origin: Secondary | ICD-10-CM | POA: Diagnosis not present

## 2020-11-05 DIAGNOSIS — D509 Iron deficiency anemia, unspecified: Secondary | ICD-10-CM | POA: Diagnosis not present

## 2020-11-05 DIAGNOSIS — I482 Chronic atrial fibrillation, unspecified: Secondary | ICD-10-CM | POA: Diagnosis not present

## 2020-11-05 DIAGNOSIS — D631 Anemia in chronic kidney disease: Secondary | ICD-10-CM | POA: Diagnosis not present

## 2020-11-05 NOTE — Progress Notes (Signed)
Chief complaint: Aneurysm surveillance  History of present illness: 50 year old man underwent repair of type a dissection several years ago in [place].  He has since moved to [place].  He is being followed for aneurysm surveillance particularly that involving the descending component of his dissection with aneurysm in the false lumen component.  His only symptom is hoarseness  Active Ambulatory Problems    Diagnosis Date Noted   Atrial fibrillation ([facility]) [I48.91] 12/23/2015   ESRD on dialysis ([facility]) 02/19/2016   Nonhealing surgical wound 04/13/2016   Arm wound, left, sequela 04/28/2016   Calciphylaxis 05/06/2016   Long term (current) use of anticoagulants [Z79.01] 12/30/2016   Chest pain 01/18/2017   Thoracoabdominal aortic aneurysm (TAAA) without rupture ([facility])    Dissection of thoracic aorta (HCC)    Essential hypertension    Chronic diastolic CHF (congestive heart failure) (HCC)    Atrial fibrillation, chronic (HCC)    End-stage renal disease on hemodialysis ([place])    Thrombocytopenia ([place])    Peripheral neuropathy    Atrial fibrillation with tachycardic ventricular rate ([facility]) 05/19/2018   Resolved Ambulatory Problems    Diagnosis Date Noted   No Resolved Ambulatory Problems   Past Medical History:  Diagnosis Date   Arthritis    Blindness and low vision    Dissection of aorta ([place]) 2004   ESRD (end stage renal disease) ([place])    Headache    History of cardioversion 2014   Hypertension    Neuropathy    Non-healing non-surgical wound 03/2016   PAF (paroxysmal atrial fibrillation) ([facility])    Paralysis ([facility])    Pneumonia      Current Outpatient Medications on File Prior to Visit  Medication Sig Dispense Refill   amiodarone (PACERONE) 200 MG tablet Take 1 tablet (200 mg total) by mouth daily. Please make overdue appt with Dr. [name] before anymore refills. Thank you 2nd attempt 15 tablet 0   cinacalcet (SENSIPAR) 90 MG tablet Take 90 mg by mouth at bedtime.       metoprolol tartrate (LOPRESSOR) 25 MG tablet Take 0.5 tablets (12.5 mg total) by mouth 2 (two) times daily. Hold on the AM of dialysis days (Patient taking differently: Take 12.5 mg by mouth 2 (two) times daily. Take on Wednesday, Thursday and Friday Hold on the AM of dialysis days) 90 tablet 3   Multiple Vitamins-Minerals (MULTIVITAMIN WITH MINERALS) tablet Take 1 tablet by mouth daily.     warfarin (COUMADIN) 5 MG tablet Take 1/2 a tablet to 1 tablet by mouth daily as directed by the coumadin clinic 35 tablet 0   No current facility-administered medications on file prior to visit.   Physical exam: BP 118/73   Pulse 74   Resp 20   Ht '6\' 2"'$  (1.88 m)   SpO2 95% Comment: RA  BMI 21.06 kg/m  Chronically ill-appearing, no acute distress Diminished breath sounds at both bases Regular rate and rhythm, no murmur  Imaging: The CT chest is reviewed from 08/06/2020.  This demonstrates a stable ascending aorta status post repair.  The descending component is dissected and aneurysmal.  Maximal diameter 5 cm  Assessment/plan: 50 year old man status post repair of type a dissection several years ago Clinically stable at present Suggest referral to [facility] for consideration of descending aorta stenting  Follow-up in 1 year with repeat chest abdomen pelvis CT  Sydelle Sherfield Z. Orvan Seen, [place]

## 2020-11-07 DIAGNOSIS — Z992 Dependence on renal dialysis: Secondary | ICD-10-CM | POA: Diagnosis not present

## 2020-11-07 DIAGNOSIS — D509 Iron deficiency anemia, unspecified: Secondary | ICD-10-CM | POA: Diagnosis not present

## 2020-11-07 DIAGNOSIS — N2581 Secondary hyperparathyroidism of renal origin: Secondary | ICD-10-CM | POA: Diagnosis not present

## 2020-11-07 DIAGNOSIS — D631 Anemia in chronic kidney disease: Secondary | ICD-10-CM | POA: Diagnosis not present

## 2020-11-07 DIAGNOSIS — N186 End stage renal disease: Secondary | ICD-10-CM | POA: Diagnosis not present

## 2020-11-11 ENCOUNTER — Other Ambulatory Visit: Payer: Self-pay

## 2020-11-11 ENCOUNTER — Ambulatory Visit (INDEPENDENT_AMBULATORY_CARE_PROVIDER_SITE_OTHER): Payer: Medicare Other

## 2020-11-11 DIAGNOSIS — Z7901 Long term (current) use of anticoagulants: Secondary | ICD-10-CM | POA: Diagnosis not present

## 2020-11-11 DIAGNOSIS — I4891 Unspecified atrial fibrillation: Secondary | ICD-10-CM

## 2020-11-11 DIAGNOSIS — Z5181 Encounter for therapeutic drug level monitoring: Secondary | ICD-10-CM

## 2020-11-11 LAB — POCT INR: INR: 4.2 — AB (ref 2.0–3.0)

## 2020-11-11 MED ORDER — WARFARIN SODIUM 5 MG PO TABS: ORAL_TABLET | ORAL | 0 refills | Status: DC

## 2020-11-11 MED ORDER — AMIODARONE HCL 200 MG PO TABS
200.0000 mg | ORAL_TABLET | Freq: Every day | ORAL | 0 refills | Status: DC
Start: 2020-11-11 — End: 2020-12-16

## 2020-11-11 NOTE — Patient Instructions (Signed)
Description   Skip tomorrow's dosage of Warfarin, then start taking warfarin 1 tablet daily except for 1/2 a tablet on Wednesdays and Saturdays. Recheck INR in 2 weeks. Coumadin Clinic [phone].

## 2020-11-11 NOTE — Telephone Encounter (Signed)
Pt seen in Coumadin Clinic today, Warfarin refilled at [facility].  Pt also needs refill on Amiodarone, aware he needs appointment with Dr [name].  Pt stopping at checkout today to schedule follow-up with Dr [name].  Please refill Amiodarone rx to get pt to upcoming appt.  Pt only has a few tablets left per pt report.  Please send refill to [facility] on [address]

## 2020-11-12 DIAGNOSIS — N2581 Secondary hyperparathyroidism of renal origin: Secondary | ICD-10-CM | POA: Diagnosis not present

## 2020-11-12 DIAGNOSIS — N186 End stage renal disease: Secondary | ICD-10-CM | POA: Diagnosis not present

## 2020-11-12 DIAGNOSIS — D631 Anemia in chronic kidney disease: Secondary | ICD-10-CM | POA: Diagnosis not present

## 2020-11-12 DIAGNOSIS — D509 Iron deficiency anemia, unspecified: Secondary | ICD-10-CM | POA: Diagnosis not present

## 2020-11-12 DIAGNOSIS — Z992 Dependence on renal dialysis: Secondary | ICD-10-CM | POA: Diagnosis not present

## 2020-11-14 DIAGNOSIS — N186 End stage renal disease: Secondary | ICD-10-CM | POA: Diagnosis not present

## 2020-11-14 DIAGNOSIS — N2581 Secondary hyperparathyroidism of renal origin: Secondary | ICD-10-CM | POA: Diagnosis not present

## 2020-11-14 DIAGNOSIS — D509 Iron deficiency anemia, unspecified: Secondary | ICD-10-CM | POA: Diagnosis not present

## 2020-11-14 DIAGNOSIS — Z992 Dependence on renal dialysis: Secondary | ICD-10-CM | POA: Diagnosis not present

## 2020-11-14 DIAGNOSIS — D631 Anemia in chronic kidney disease: Secondary | ICD-10-CM | POA: Diagnosis not present

## 2020-11-19 DIAGNOSIS — N2581 Secondary hyperparathyroidism of renal origin: Secondary | ICD-10-CM | POA: Diagnosis not present

## 2020-11-19 DIAGNOSIS — D631 Anemia in chronic kidney disease: Secondary | ICD-10-CM | POA: Diagnosis not present

## 2020-11-19 DIAGNOSIS — D509 Iron deficiency anemia, unspecified: Secondary | ICD-10-CM | POA: Diagnosis not present

## 2020-11-19 DIAGNOSIS — N186 End stage renal disease: Secondary | ICD-10-CM | POA: Diagnosis not present

## 2020-11-19 DIAGNOSIS — Z992 Dependence on renal dialysis: Secondary | ICD-10-CM | POA: Diagnosis not present

## 2020-11-21 DIAGNOSIS — D631 Anemia in chronic kidney disease: Secondary | ICD-10-CM | POA: Diagnosis not present

## 2020-11-21 DIAGNOSIS — N186 End stage renal disease: Secondary | ICD-10-CM | POA: Diagnosis not present

## 2020-11-21 DIAGNOSIS — D509 Iron deficiency anemia, unspecified: Secondary | ICD-10-CM | POA: Diagnosis not present

## 2020-11-21 DIAGNOSIS — Z992 Dependence on renal dialysis: Secondary | ICD-10-CM | POA: Diagnosis not present

## 2020-11-21 DIAGNOSIS — N2581 Secondary hyperparathyroidism of renal origin: Secondary | ICD-10-CM | POA: Diagnosis not present

## 2020-11-26 DIAGNOSIS — Z992 Dependence on renal dialysis: Secondary | ICD-10-CM | POA: Diagnosis not present

## 2020-11-26 DIAGNOSIS — N186 End stage renal disease: Secondary | ICD-10-CM | POA: Diagnosis not present

## 2020-11-26 DIAGNOSIS — D509 Iron deficiency anemia, unspecified: Secondary | ICD-10-CM | POA: Diagnosis not present

## 2020-11-26 DIAGNOSIS — N2581 Secondary hyperparathyroidism of renal origin: Secondary | ICD-10-CM | POA: Diagnosis not present

## 2020-11-26 DIAGNOSIS — D631 Anemia in chronic kidney disease: Secondary | ICD-10-CM | POA: Diagnosis not present

## 2020-11-28 DIAGNOSIS — D631 Anemia in chronic kidney disease: Secondary | ICD-10-CM | POA: Diagnosis not present

## 2020-11-28 DIAGNOSIS — D509 Iron deficiency anemia, unspecified: Secondary | ICD-10-CM | POA: Diagnosis not present

## 2020-11-28 DIAGNOSIS — N186 End stage renal disease: Secondary | ICD-10-CM | POA: Diagnosis not present

## 2020-11-28 DIAGNOSIS — Z992 Dependence on renal dialysis: Secondary | ICD-10-CM | POA: Diagnosis not present

## 2020-11-28 DIAGNOSIS — N2581 Secondary hyperparathyroidism of renal origin: Secondary | ICD-10-CM | POA: Diagnosis not present

## 2020-12-01 ENCOUNTER — Telehealth: Payer: Self-pay

## 2020-12-02 ENCOUNTER — Encounter: Payer: Medicare Other | Admitting: Vascular Surgery

## 2020-12-03 DIAGNOSIS — D631 Anemia in chronic kidney disease: Secondary | ICD-10-CM | POA: Diagnosis not present

## 2020-12-03 DIAGNOSIS — Z992 Dependence on renal dialysis: Secondary | ICD-10-CM | POA: Diagnosis not present

## 2020-12-03 DIAGNOSIS — N186 End stage renal disease: Secondary | ICD-10-CM | POA: Diagnosis not present

## 2020-12-03 DIAGNOSIS — N2581 Secondary hyperparathyroidism of renal origin: Secondary | ICD-10-CM | POA: Diagnosis not present

## 2020-12-03 DIAGNOSIS — D509 Iron deficiency anemia, unspecified: Secondary | ICD-10-CM | POA: Diagnosis not present

## 2020-12-03 DIAGNOSIS — I129 Hypertensive chronic kidney disease with stage 1 through stage 4 chronic kidney disease, or unspecified chronic kidney disease: Secondary | ICD-10-CM | POA: Diagnosis not present

## 2020-12-05 DIAGNOSIS — N2581 Secondary hyperparathyroidism of renal origin: Secondary | ICD-10-CM | POA: Diagnosis not present

## 2020-12-05 DIAGNOSIS — Z992 Dependence on renal dialysis: Secondary | ICD-10-CM | POA: Diagnosis not present

## 2020-12-05 DIAGNOSIS — D509 Iron deficiency anemia, unspecified: Secondary | ICD-10-CM | POA: Diagnosis not present

## 2020-12-05 DIAGNOSIS — N186 End stage renal disease: Secondary | ICD-10-CM | POA: Diagnosis not present

## 2020-12-05 DIAGNOSIS — D631 Anemia in chronic kidney disease: Secondary | ICD-10-CM | POA: Diagnosis not present

## 2020-12-10 ENCOUNTER — Other Ambulatory Visit: Payer: Self-pay

## 2020-12-10 ENCOUNTER — Telehealth (INDEPENDENT_AMBULATORY_CARE_PROVIDER_SITE_OTHER): Payer: Self-pay | Admitting: *Deleted

## 2020-12-10 ENCOUNTER — Encounter (HOSPITAL_COMMUNITY): Payer: Self-pay | Admitting: *Deleted

## 2020-12-10 ENCOUNTER — Emergency Department (HOSPITAL_COMMUNITY)
Admission: EM | Admit: 2020-12-10 | Discharge: 2020-12-10 | Disposition: A | Discharge status: Home / Self Care | Payer: Medicare Other | Attending: Emergency Medicine | Admitting: Emergency Medicine

## 2020-12-10 ENCOUNTER — Emergency Department (HOSPITAL_COMMUNITY): Payer: Medicare Other

## 2020-12-10 DIAGNOSIS — Z7901 Long term (current) use of anticoagulants: Secondary | ICD-10-CM | POA: Insufficient documentation

## 2020-12-10 DIAGNOSIS — K551 Chronic vascular disorders of intestine: Secondary | ICD-10-CM | POA: Diagnosis not present

## 2020-12-10 DIAGNOSIS — I5032 Chronic diastolic (congestive) heart failure: Secondary | ICD-10-CM | POA: Diagnosis not present

## 2020-12-10 DIAGNOSIS — R110 Nausea: Secondary | ICD-10-CM | POA: Diagnosis not present

## 2020-12-10 DIAGNOSIS — Z79899 Other long term (current) drug therapy: Secondary | ICD-10-CM | POA: Diagnosis not present

## 2020-12-10 DIAGNOSIS — I132 Hypertensive heart and chronic kidney disease with heart failure and with stage 5 chronic kidney disease, or end stage renal disease: Secondary | ICD-10-CM | POA: Insufficient documentation

## 2020-12-10 DIAGNOSIS — R1084 Generalized abdominal pain: Secondary | ICD-10-CM | POA: Diagnosis not present

## 2020-12-10 DIAGNOSIS — I723 Aneurysm of iliac artery: Secondary | ICD-10-CM | POA: Diagnosis not present

## 2020-12-10 DIAGNOSIS — K8689 Other specified diseases of pancreas: Secondary | ICD-10-CM | POA: Diagnosis not present

## 2020-12-10 DIAGNOSIS — R1033 Periumbilical pain: Secondary | ICD-10-CM | POA: Diagnosis not present

## 2020-12-10 DIAGNOSIS — R1031 Right lower quadrant pain: Secondary | ICD-10-CM | POA: Insufficient documentation

## 2020-12-10 DIAGNOSIS — R69 Illness, unspecified: Secondary | ICD-10-CM

## 2020-12-10 DIAGNOSIS — R0789 Other chest pain: Secondary | ICD-10-CM | POA: Diagnosis not present

## 2020-12-10 DIAGNOSIS — Z992 Dependence on renal dialysis: Secondary | ICD-10-CM | POA: Insufficient documentation

## 2020-12-10 DIAGNOSIS — Z20822 Contact with and (suspected) exposure to covid-19: Secondary | ICD-10-CM | POA: Diagnosis not present

## 2020-12-10 DIAGNOSIS — N186 End stage renal disease: Secondary | ICD-10-CM | POA: Insufficient documentation

## 2020-12-10 DIAGNOSIS — I712 Thoracic aortic aneurysm, without rupture: Secondary | ICD-10-CM | POA: Diagnosis not present

## 2020-12-10 DIAGNOSIS — Z452 Encounter for adjustment and management of vascular access device: Secondary | ICD-10-CM | POA: Diagnosis not present

## 2020-12-10 DIAGNOSIS — I7101 Dissection of thoracic aorta: Secondary | ICD-10-CM | POA: Diagnosis not present

## 2020-12-10 LAB — COMPREHENSIVE METABOLIC PANEL
ALT: 8 U/L (ref 0–44)
AST: 10 U/L — ABNORMAL LOW (ref 15–41)
Albumin: 3.5 g/dL (ref 3.5–5.0)
Alkaline Phosphatase: 337 U/L — ABNORMAL HIGH (ref 38–126)
Anion gap: 17 — ABNORMAL HIGH (ref 5–15)
BUN: 57 mg/dL — ABNORMAL HIGH (ref 6–20)
CO2: 24 mmol/L (ref 22–32)
Calcium: 8.2 mg/dL — ABNORMAL LOW (ref 8.9–10.3)
Chloride: 95 mmol/L — ABNORMAL LOW (ref 98–111)
Creatinine, Ser: 12.22 mg/dL — ABNORMAL HIGH (ref 0.61–1.24)
GFR, Estimated: 5 mL/min — ABNORMAL LOW (ref >60)
Glucose, Bld: 92 mg/dL (ref 70–99)
Potassium: 4.5 mmol/L (ref 3.5–5.1)
Sodium: 136 mmol/L (ref 135–145)
Total Bilirubin: 0.6 mg/dL (ref 0.3–1.2)
Total Protein: 7.2 g/dL (ref 6.5–8.1)

## 2020-12-10 LAB — CBC
HCT: 36.5 % — ABNORMAL LOW (ref 39.0–52.0)
Hemoglobin: 11.1 g/dL — ABNORMAL LOW (ref 13.0–17.0)
MCH: 29.7 pg (ref 26.0–34.0)
MCHC: 30.4 g/dL (ref 30.0–36.0)
MCV: 97.6 fL (ref 80.0–100.0)
Platelets: 146 10*3/uL — ABNORMAL LOW (ref 150–400)
RBC: 3.74 MIL/uL — ABNORMAL LOW (ref 4.22–5.81)
RDW: 14.9 % (ref 11.5–15.5)
WBC: 6.4 10*3/uL (ref 4.0–10.5)
nRBC: 0 % (ref 0.0–0.2)

## 2020-12-10 LAB — LIPASE, BLOOD: Lipase: 54 U/L — ABNORMAL HIGH (ref 11–51)

## 2020-12-10 LAB — RESP PANEL BY RT-PCR (FLU A&B, COVID) ARPGX2
Influenza A by PCR: NEGATIVE
Influenza B by PCR: NEGATIVE
SARS Coronavirus 2 by RT PCR: NEGATIVE

## 2020-12-10 MED ORDER — HYDROMORPHONE HCL 1 MG/ML IJ SOLN
1.0000 mg | Freq: Once | INTRAMUSCULAR | Status: AC
  Administered 2020-12-10: 1 mg via INTRAVENOUS
  Filled 2020-12-10: qty 1

## 2020-12-10 MED ORDER — DOCUSATE SODIUM 100 MG PO CAPS: 100.0000 mg | ORAL_CAPSULE | Freq: Two times a day (BID) | ORAL | 0 refills | Status: DC

## 2020-12-10 MED ORDER — OXYCODONE-ACETAMINOPHEN 5-325 MG PO TABS
1.0000 | ORAL_TABLET | Freq: Once | ORAL | Status: AC
  Administered 2020-12-10: 1 via ORAL
  Filled 2020-12-10: qty 1

## 2020-12-10 MED ORDER — MORPHINE SULFATE (PF) 4 MG/ML IV SOLN
4.0000 mg | Freq: Once | INTRAVENOUS | Status: AC
  Administered 2020-12-10: 4 mg via INTRAVENOUS
  Filled 2020-12-10: qty 1

## 2020-12-10 MED ORDER — OXYCODONE-ACETAMINOPHEN 5-325 MG PO TABS
1.0000 | ORAL_TABLET | ORAL | 0 refills | Status: DC | PRN
Start: 2020-12-10 — End: 2021-11-12

## 2020-12-10 MED ORDER — IOHEXOL 350 MG/ML SOLN
80.0000 mL | Freq: Once | INTRAVENOUS | Status: AC | PRN
  Administered 2020-12-10: 80 mL via INTRAVENOUS

## 2020-12-10 NOTE — ED Triage Notes (Signed)
The pt is c/o abd pain since 0200am  no n v or diarrhea dialysis pt chest catheter for dialysis  l

## 2020-12-10 NOTE — ED Notes (Signed)
Pt states he does not make urine.  

## 2020-12-10 NOTE — ED Provider Notes (Signed)
[facility] EMERGENCY DEPARTMENT Provider Note   CSN: [id number] Arrival date & time: 12/10/20  [id number]     History Chief Complaint  Patient presents with   Abdominal Pain    Corey [name] is a 50 y.o. male.   Abdominal Pain Associated symptoms: nausea   Associated symptoms: no chest pain, no fever, no shortness of breath and no vomiting    Patient on dialysis with history of AAA  presents with acute onset abdominal pain a 2 AM this morning.  Pain is constant, worsened overnight.  Has not been alleviated by anything.  Does not radiate to the back.  States the pain feels similar to his previous angiogram.  He is not having any chest pain or shortness of breath.  His last dialysis was 4 days ago.  Patient does not make any urine.  He was seen by vascular on 10/22/20 for routine follow up.  There is associated nausea, but no vomiting.  Patient is not having any fevers at the home.  He does have chronic neuropathy and edema in his legs.  Past Medical History:  Diagnosis Date   Arthritis    hands and shoulders   Blindness and low vision    "Stargardt disease"   Dissection of aorta ([place]) 2004   a. s/p extensive repeair in [id number] in [place] complicated by ESRD, lower extremity paralysis, coma, and extended hospitalization of 2 years   ESRD (end stage renal disease) ([place])    a. TTS   Headache    History of cardioversion 2014   Hypertension    Neuropathy    Non-healing non-surgical wound 03/2016   PAF (paroxysmal atrial fibrillation) ([place])    a. s/p DCCV in 2014; b. on Coumadin; c. CHADS2VASc => 2 (HTN, vascular disease)   Paralysis ([place])    due to dissection of aorta in 2004, lower extremities   Pneumonia     Patient Active Problem List   Diagnosis Date Noted   Atrial fibrillation with tachycardic ventricular rate ([place]) 05/19/2018   Dissection of thoracic aorta (HCC)    Essential hypertension    Chronic diastolic CHF (congestive heart failure) (HCC)    Atrial  fibrillation, chronic (HCC)    End-stage renal disease on hemodialysis (HCC)    Thrombocytopenia (HCC)    Peripheral neuropathy    Thoracoabdominal aortic aneurysm (TAAA) without rupture ([place])    Chest pain 01/18/2017   Long term (current) use of anticoagulants [Z79.01] 12/30/2016   Calciphylaxis 05/06/2016   Arm wound, left, sequela 04/28/2016   Nonhealing surgical wound 04/13/2016   ESRD on dialysis ([place]) 02/19/2016   Atrial fibrillation ([place]) [I48.91] 12/23/2015    Past Surgical History:  Procedure Laterality Date   APPLICATION OF WOUND VAC Left 04/13/2016   Procedure: APPLICATION OF WOUND VAC;  Surgeon: Conrad Drayton, MD;  Location: [place];  Service: Vascular;  Laterality: Left;   APPLICATION OF WOUND VAC Left 04/18/2016   Procedure: APPLICATION OF WOUND VAC;  Surgeon: Waynetta Sandy, MD;  Location: [place];  Service: Vascular;  Laterality: Left;  Wound vac change    APPLICATION OF WOUND VAC Left 04/20/2016   Procedure: WOUND VAC CHANGE;  Surgeon: Conrad Chatham, MD;  Location: [place];  Service: Vascular;  Laterality: Left;   AV FISTULA PLACEMENT     [place] Left 12/09/2015   Procedure: FIRST STAGE BASILIC VEIN TRANSPOSITION LEFT UPPER ARM;  Surgeon: Conrad [place]ahn[place], MD;  Location: [place];  Service: Vascular;  Laterality: Left;   [place] TRANSPOSITION Left 03/09/2016   Procedure: SECOND STAGE BASILIC VEIN TRANSPOSITION WITH REVISION OF ANASTOMOSIS LEFT UPPER ARM;  Surgeon: Conrad Mason, MD;  Location: [place];  Service: Vascular;  Laterality: Left;   CARDIOVERSION     CARDIOVERSION N/A 01/04/2019   Procedure: CARDIOVERSION;  Surgeon: Lelon Perla, MD;  [facility];  Service: Cardiovascular;  Laterality: N/A;   REPAIR OF ACUTE ASCENDING THORACIC AORTIC DISSECTION     REVISON OF ARTERIOVENOUS FISTULA Left 04/20/2016   Procedure: LIGATION OF BASILIC VEIN TRANSPOSITION;  Surgeon: Conrad [place], MD;  Location: [facility] OR;  Service: Vascular;  Laterality: Left;    WOUND DEBRIDEMENT Left 04/13/2016   Procedure: DEBRIDEMENT WOUND;  Surgeon: Conrad [place], MD;  Location: [facility];  Service: Vascular;  Laterality: Left;       Family History  Problem Relation Age of Onset   Cancer Mother    Hypertension Mother    Cancer Father    Hypertension Father    Thyroid disease Sister    Stroke Brother        54    Social History   Tobacco Use   Smoking status: Never   Smokeless tobacco: Never  Vaping Use   Vaping Use: Never used  Substance Use Topics   Alcohol use: No   Drug use: No    Home Medications Prior to Admission medications   Medication Sig Start Date End Date Taking? Authorizing Provider  amiodarone (PACERONE) 200 MG tablet Take 1 tablet (200 mg total) by mouth daily. Please keep your upcoming appointment for any future refills. Thank you. 11/11/20   Jerline Pain, MD  cinacalcet (SENSIPAR) 90 MG tablet Take 90 mg by mouth at bedtime.  11/09/18   [provider]  metoprolol tartrate (LOPRESSOR) 25 MG tablet Take 0.5 tablets (12.5 mg total) by mouth 2 (two) times daily. Hold on the AM of dialysis days Patient taking differently: Take 12.5 mg by mouth 2 (two) times daily. Take on Wednesday, Thursday and Friday Hold on the AM of dialysis days 08/12/19 10/22/20  Burtis Junes, NP  Multiple Vitamins-Minerals (MULTIVITAMIN WITH MINERALS) tablet Take 1 tablet by mouth daily.    [provider]  warfarin (COUMADIN) 5 MG tablet Take 1/2 a tablet to 1 tablet by mouth daily as directed by the coumadin clinic 11/11/20   Jerline Pain, MD    Allergies    Ciprofloxacin, Heparin, and Quinolones  Review of Systems   Review of Systems  Constitutional:  Negative for fever.  Respiratory:  Negative for shortness of breath.   Cardiovascular:  Positive for leg swelling. Negative for chest pain.  Gastrointestinal:  Positive for abdominal pain and nausea. Negative for vomiting.  Musculoskeletal:  Negative for back pain.  Neurological:   Negative for syncope and numbness.   Physical Exam Updated Vital Signs BP (!) 148/103 (BP Location: Left Arm)   Pulse 100   Temp (!) 97.4 F (36.3 C) (Oral)   Resp 18   Ht '6\' 2"'$  (1.88 m)   Wt 83.9 kg   SpO2 100%   BMI 23.75 kg/m   Physical Exam Vitals and nursing note reviewed. Exam conducted with a chaperone present.  Constitutional:      Appearance: Normal appearance.     Comments: Uncomfortable appearing, appears chronically ill  HENT:     Head: Normocephalic and atraumatic.  Eyes:     General: No scleral icterus.  Right eye: No discharge.        Left eye: No discharge.     Extraocular Movements: Extraocular movements intact.     Pupils: Pupils are equal, round, and reactive to light.  Cardiovascular:     Rate and Rhythm: Normal rate. Rhythm irregular.     Pulses: Normal pulses.     Heart sounds: Normal heart sounds. No murmur heard.   No friction rub. No gallop.     Comments: Radial pulses 2+, equal bilaterally.  Unable to palpate DP, but confirmed with Doppler. Pulmonary:     Effort: Pulmonary effort is normal. No respiratory distress.     Breath sounds: Normal breath sounds.  Abdominal:     General: Abdomen is flat. A surgical scar is present. Bowel sounds are normal. There is no distension.     Palpations: Abdomen is soft.     Tenderness: There is abdominal tenderness in the right lower quadrant and periumbilical area. There is guarding. There is no rebound.     Comments: Patient abdomen soft, but exquisitely tender to touch.  There is voluntary guarding without any rebound.  Musculoskeletal:     Comments: Patient with pitting edema bilaterally.  Skin:    General: Skin is warm and dry.     Capillary Refill: Capillary refill takes less than 2 seconds.     Coloration: Skin is not jaundiced.     Comments: Patient feet appear necrotic, black toes with fungal toenails.  No ulcerations, nothing actively bleeding.  Neurological:     Mental Status: He is alert.  Mental status is at baseline.     Coordination: Coordination normal.    ED Results / Procedures / Treatments   Labs (all labs ordered are listed, but only abnormal results are displayed) Labs Reviewed  LIPASE, BLOOD - Abnormal; Notable for the following components:      Result Value   Lipase 54 (*)    All other components within normal limits  COMPREHENSIVE METABOLIC PANEL - Abnormal; Notable for the following components:   Chloride 95 (*)    BUN 57 (*)    Creatinine, Ser 12.22 (*)    Calcium 8.2 (*)    AST 10 (*)    Alkaline Phosphatase 337 (*)    GFR, Estimated 5 (*)    Anion gap 17 (*)    All other components within normal limits  CBC - Abnormal; Notable for the following components:   RBC 3.74 (*)    Hemoglobin 11.1 (*)    HCT 36.5 (*)    Platelets 146 (*)    All other components within normal limits  RESP PANEL BY RT-PCR (FLU A&B, COVID) [id number]    EKG None  Radiology No results found.  Procedures Procedures   Medications Ordered in ED Medications  morphine 4 MG/ML injection 4 mg (4 mg Intravenous Given 12/10/20 1515)    ED Course  I have reviewed the triage vital signs and the nursing notes.  Pertinent labs & imaging results that were available during my care of the patient were reviewed by me and considered in my medical decision making (see chart for details).    MDM Rules/Calculators/A&P                           Patient vitals are stable, but he is uncomfortable, and he has significant abdominal tenderness.  Pulses are present, although weak in lower extremity and palpable only with doppler. UE and  LE pulses are roughly symmetric.  He does have pitting edema likely from missing dialysis. We will give him some pain medicine and plan for dissection study given his history and appearance.   Will also check labs to evaluate for cardiac etiology and electrolyte derangement from missed dialysis.   Patient workup is still in process at shift change. Patient  case discussed with Dr. Charlesetta Shanks. She is aware of the patient and will follow his care. See her note for disposition.  Final Clinical Impression(s) / ED Diagnoses Final diagnoses:  None    Rx / DC Orders ED Discharge Orders     None        Sherrill Raring, Hershal Coria 12/10/20 1858    Tegeler, Gwenyth Allegra, MD 12/10/20 2041

## 2020-12-10 NOTE — H&P (Signed)
Hospital Consult    Reason for Consult:  Abdominal pain with known aortic dissection Requesting Physician:  Dr. Johnney Killian MRN #:  [id number]  History of Present Illness: This is a 50 y.o. nonambulatory male with history of calciphylaxis, ESRD (2003), previous type A (zones 0-8) aortic dissection repaired in [place] which has been followed by the thoracic surgery group yearly for aneurysmal degeneration of the descending thoracic aorta.   Patient presented to the emergency department this afternoon with a 12-hour history of new onset abdominal pain.  He first appreciated the crampy abdominal pain this morning and thought it was related to gas, however the pain continued to increase.  He had multiple bowel movements with no issue.  The abdominal pain was associated with nausea, however there was no vomiting.  Since admission, his abdominal pain has improved, and has migrated to the patient's right lower quadrant.  Vascular surgery was called after CT findings were concerning for new penetrating aortic ulcer in the infrarenal aorta.   Past Medical History:  Diagnosis Date   Arthritis    hands and shoulders   Blindness and low vision    "Stargardt disease"   Dissection of aorta ([place]) 2004   a. s/p extensive repeair in [id number] in [place] complicated by ESRD, lower extremity paralysis, coma, and extended hospitalization of 2 years   ESRD (end stage renal disease) ([place])    a. TTS   Headache    History of cardioversion 2014   Hypertension    Neuropathy    Non-healing non-surgical wound 03/2016   PAF (paroxysmal atrial fibrillation) ([place])    a. s/p DCCV in 2014; b. on Coumadin; c. CHADS2VASc => 2 (HTN, vascular disease)   Paralysis ([place])    due to dissection of aorta in 2004, lower extremities   Pneumonia     Past Surgical History:  Procedure Laterality Date   APPLICATION OF WOUND VAC Left 04/13/2016   Procedure: APPLICATION OF WOUND VAC;  Surgeon: Conrad Cartwright, MD;  Location: [place];  Service:  Vascular;  Laterality: Left;   APPLICATION OF WOUND VAC Left 04/18/2016   Procedure: APPLICATION OF WOUND VAC;  Surgeon: Waynetta Sandy, MD;  Location: [place];  Service: Vascular;  Laterality: Left;  Wound vac change    APPLICATION OF WOUND VAC Left 04/20/2016   Procedure: WOUND VAC CHANGE;  Surgeon: Conrad [place], MD;  Location: [place];  Service: Vascular;  Laterality: Left;   AV FISTULA PLACEMENT     [place] Left 12/09/2015   Procedure: FIRST STAGE BASILIC VEIN TRANSPOSITION LEFT UPPER ARM;  Surgeon: Conrad [place], MD;  Location: [place];  Service: Vascular;  Laterality: Left;   [place] Left 03/09/2016   Procedure: SECOND STAGE BASILIC VEIN TRANSPOSITION WITH REVISION OF ANASTOMOSIS LEFT UPPER ARM;  Surgeon: Conrad Panola, MD;  Location: [place];  Service: Vascular;  Laterality: Left;   CARDIOVERSION     CARDIOVERSION N/A 01/04/2019   Procedure: CARDIOVERSION;  Surgeon: Lelon Perla, MD;  [facility];  Service: Cardiovascular;  Laterality: N/A;   REPAIR OF ACUTE ASCENDING THORACIC AORTIC DISSECTION     REVISON OF ARTERIOVENOUS FISTULA Left 04/20/2016   Procedure: LIGATION OF BASILIC VEIN TRANSPOSITION;  Surgeon: Conrad Muir, MD;  Location: [place];  Service: Vascular;  Laterality: Left;   WOUND DEBRIDEMENT Left 04/13/2016   Procedure: DEBRIDEMENT WOUND;  Surgeon: Conrad Clemson, MD;  Location: [place];  Service: Vascular;  Laterality: Left;  Allergies  Allergen Reactions   Ciprofloxacin Other (See Comments)    Aortic dissection   Heparin Other (See Comments)    UNSPECIFIED REACTION :  On Coumadin since 2004   HIT panel negative 01/19/17   Quinolones Other (See Comments)    unknown    Prior to Admission medications   Medication Sig Start Date End Date Taking? Authorizing Provider  amiodarone (PACERONE) 200 MG tablet Take 1 tablet (200 mg total) by mouth daily. Please keep your upcoming appointment for any future refills. Thank  you. Patient taking differently: Take 200 mg by mouth every evening. 11/11/20  Yes Jerline Pain, MD  cinacalcet (SENSIPAR) 60 MG tablet Take 120 mg by mouth every evening. 07/30/20  Yes [provider]  metoprolol tartrate (LOPRESSOR) 25 MG tablet Take 0.5 tablets (12.5 mg total) by mouth 2 (two) times daily. Hold on the AM of dialysis days Patient taking differently: Take 12.5 mg by mouth 2 (two) times daily. Take on Wednesday, Thursday and Friday Hold on the AM of dialysis days 08/12/19 02/06/21 Yes Burtis Junes, NP  Multiple Vitamins-Minerals (MULTIVITAMIN WITH MINERALS) tablet Take 1 tablet by mouth daily.   Yes [provider]  RENVELA 2.4 g PACK Take 4.8 g by mouth in the morning, at noon, and at bedtime. 09/21/20  Yes [provider]  warfarin (COUMADIN) 5 MG tablet Take 1/2 a tablet to 1 tablet by mouth daily as directed by the coumadin clinic Patient taking differently: Take 5 mg by mouth See admin instructions. Take '5mg'$  every evening except for Friday's take none 11/11/20  Yes Skains, Thana Farr, MD    Social History   Socioeconomic History   Marital status: Divorced    Spouse name: Not on file   Number of children: 2   Years of education: Not on file   Highest education level: Not on file  Occupational History   Occupation: DIABLED  Tobacco Use   Smoking status: Never   Smokeless tobacco: Never  Vaping Use   Vaping Use: Never used  Substance and Sexual Activity   Alcohol use: No   Drug use: No   Sexual activity: Not on file  Other Topics Concern   Not on file  Social History Narrative   Not on file   Social Determinants of Health   Financial Resource Strain: Not on file  Food Insecurity: Not on file  Transportation Needs: Not on file  Physical Activity: Not on file  Stress: Not on file  Social Connections: Not on file  Intimate Partner Violence: Not on file    Family History  Problem Relation Age of Onset   Cancer Mother     Hypertension Mother    Cancer Father    Hypertension Father    Thyroid disease Sister    Stroke Brother        11    ROS: Otherwise negative unless mentioned in HPI  Physical Examination  Vitals:   12/10/20 1816 12/10/20 2008  BP: 111/77 116/88  Pulse: 88 96  Resp: 16 18  Temp:    SpO2: 99% 99%   Body mass index is 23.75 kg/m.  General:  WDWN in NAD Gait: Not observed HENT: WNL, normocephalic Pulmonary: normal non-labored breathing, without Rales, rhonchi,  wheezing Cardiac: regular, without  Murmurs, rubs or gallops; without carotid bruits Abdomen: soft, ND, no masses, pain when palpated in the RLQ Skin: without rashes, dermal sloughing bilateral feet,  Vascular Exam/Pulses: 2DP, 2+ femoral pulses bilaterally Extremities:  without ischemic changes, without Gangrene , without cellulitis Musculoskeletal: no muscle wasting or atrophy  Neurologic: A&O X 3;  No focal weakness or paresthesias are detected; speech is fluent/normal Psychiatric:  The pt has Normal affect. Lymph:  Unremarkable  CBC    Component Value Date/Time   WBC 6.4 12/10/2020 0726   RBC 3.74 (L) 12/10/2020 0726   HGB 11.1 (L) 12/10/2020 0726   HGB 11.6 (L) 08/12/2019 1000   HCT 36.5 (L) 12/10/2020 0726   HCT 34.0 (L) 08/12/2019 1000   PLT 146 (L) 12/10/2020 0726   PLT 143 (L) 08/12/2019 1000   MCV 97.6 12/10/2020 0726   MCV 88 08/12/2019 1000   MCH 29.7 12/10/2020 0726   MCHC 30.4 12/10/2020 0726   RDW 14.9 12/10/2020 0726   RDW 15.1 08/12/2019 1000   LYMPHSABS 0.7 06/06/2018 0657   LYMPHSABS 1.0 02/13/2017 1425   MONOABS 0.6 06/06/2018 0657   EOSABS 0.1 06/06/2018 0657   EOSABS 0.1 02/13/2017 1425   BASOSABS 0.0 06/06/2018 0657   BASOSABS 0.0 02/13/2017 1425    BMET    Component Value Date/Time   NA 136 12/10/2020 0726   NA 140 09/04/2019 1114   K 4.5 12/10/2020 0726   CL 95 (L) 12/10/2020 0726   CO2 24 12/10/2020 0726   GLUCOSE 92 12/10/2020 0726   BUN 57 (H) 12/10/2020 0726   BUN  79 (HH) 09/04/2019 1114   CREATININE 12.22 (H) 12/10/2020 0726   CALCIUM 8.2 (L) 12/10/2020 0726   GFRNONAA 5 (L) 12/10/2020 0726   GFRAA 5 (L) 09/04/2019 1114    COAGS: Lab Results  Component Value Date   INR 4.2 (A) 11/11/2020   INR 3.3 (A) 10/14/2020   INR 3.5 (A) 07/27/2020     Non-Invasive Vascular Imaging:     Statin:  No. Beta Blocker:  Yes.   Aspirin:  No. ACEI:  No. ARB:  No. CCB use:  No Other antiplatelets/anticoagulants:  Yes.   warfarin   ASSESSMENT/PLAN: This is a 51 y.o. male with new onset abdominal pain.  The pain is migrated to the right lower quadrant and has improved since presented to the ED.  In reviewing the CT scan, and comparing it to previous (2018), there are no changes in the infrarenal aorta.  The wall abnormality described by radiology in the infrarenal aorta is calcified and is unchanged in size - see above image.  All visceral vessels have contrast opacification.  -The unchanged wall abnormality is not the source of the patient's right lower quadrant abdominal pain. Colten should follow-up with our office in 1 year with repeat CT angio chest abdomen pelvis for surveillance of the degenerated aneurysm in his descending thoracic aorta which currently measures under 5 cm.  Due to his significant comorbidities, Darshaun is unsure if he would want to pursue surgery if the aneurysm continued to increase in size.    Cassandria Santee MD [facility] [phone] 12/10/2020  8:26 PM

## 2020-12-10 NOTE — ED Provider Notes (Signed)
Patient had very cute onset of sharp abdominal pain at 2 AM.  He was awake at that time watching television.  He indicates the pain is in the central abdomen slightly to the right of the umbilicus.  He denies he has been having any constipation or diarrhea.  No vomiting. Physical Exam  BP (!) 129/102 (BP Location: Right Arm)   Pulse 78   Temp (!) 97.4 F (36.3 C) (Oral)   Resp 18   Ht '6\' 2"'$  (1.88 m)   Wt 83.9 kg   SpO2 99%   BMI 23.75 kg/m   Physical Exam Patient is alert with clear mental status.  Heart regular.  Lungs grossly clear without respiratory distress.  Patient has focus of severe reproducible pain in the periumbilical area just slightly to the right of the umbilicus.  Patient has severe chronic edema and thickening of skin of the lower extremities. ED Course/Procedures     Procedures  MDM  CT scan raised the question of a new, small saccular infrarenal aneurysm.  I did consult cardiothoracic surgery with Dr. Roxan Hockey.  He has reviewed the scans and finds that the thoracic portions are stable.  He suggested consulting vascular surgery regarding the infrarenal saccular aneurysm. Consult: Dr. Unk Lightning vascular surgery.  He has done consult in the emergency department and after reviewing CTs finds that the aneurysmal portion is stable and unchanged from previous.  At this time does not suspect this is an acute vascular process. Patient was treated for pain with much improved pain control.  At this time I do not have specific etiology for focus of pain.  However it is fairly localized in the abdomen and CT does not show any other acute processes at this time.  Plan will be for close follow-up and pain control with 1 Percocet as needed.  Patient counseled to use Colace if he tends towards any constipation.  Patient requested referral to podiatry.  Patient has severe advanced skin changes and peripheral neuropathy.  This is chronic.  Recommendation is to contact [facility].      Charlesetta Shanks, MD 12/10/20 2223

## 2020-12-10 NOTE — Telephone Encounter (Signed)
Patient is calling from the ED at [facility]. Patient states he is faint- feels like he is going to "pass out", he states he is having a hard time breathing and feels nauseated.  Advised patient he needs to stay and be seen- he states he does not think he can wait as long as 12 hours. Call to charge nurse- alerted them to patient call and changes in status. They will check on patient- patient advised someone will check his status- please wait to be seen.

## 2020-12-10 NOTE — Discharge Instructions (Addendum)
1.  At this time, a specific cause for your abdominal pain was not identified.  You have many severe medical problems.  You will need close follow-up and recheck with your doctor to make sure your symptoms are improving. 2.  You may take 1 Percocet every 4-6 hours as needed for pain.  Percocet can cause constipation.  If you tend to get constipated start taking a stool softener such as Colace.  You may take Colace per package instructions which is 1 tablet twice daily. 3.  You have requested referral to a podiatrist.  You may call [facility], information has been included in your discharge instructions. 4.  Return to emergency department if you have new worsening or concerning symptoms.

## 2020-12-12 DIAGNOSIS — D631 Anemia in chronic kidney disease: Secondary | ICD-10-CM | POA: Diagnosis not present

## 2020-12-12 DIAGNOSIS — N2581 Secondary hyperparathyroidism of renal origin: Secondary | ICD-10-CM | POA: Diagnosis not present

## 2020-12-12 DIAGNOSIS — D509 Iron deficiency anemia, unspecified: Secondary | ICD-10-CM | POA: Diagnosis not present

## 2020-12-12 DIAGNOSIS — Z992 Dependence on renal dialysis: Secondary | ICD-10-CM | POA: Diagnosis not present

## 2020-12-12 DIAGNOSIS — N186 End stage renal disease: Secondary | ICD-10-CM | POA: Diagnosis not present

## 2020-12-13 NOTE — Progress Notes (Deleted)
Cardiology Office Note:    Date:  12/13/2020   ID:  [name], DOB 09-19-1970, MRN [id number]  PCP:  Kerin Perna, NP   [facility] Providers Cardiologist:  Candee Furbish, MD { Click to update primary MD,subspecialty MD or APP then REFRESH:1}  ***  Referring MD: Kerin Perna, NP   Chief Complaint:  No chief complaint on file. {Click here for Visit Info    :1}   Patient Profile:    ESTOL GARRIS is a 50 y.o. male with:  Paroxysmal atrial fibrillation  S/p DCCV in 2014 S/p DCCV 01/2019 Amiodarone Rx ESRD on hemodialysis  Type B aortic dissection (stable; original aortic dissection in 2004) S/p extensive repair in [place]; long hospital stay C/b paraplegia  F/u by TCTS now CT 9/22: stable Stanford Type B Ao Dissection; thoracic arch 5.2 cm; AAA 3 cm  Thoracic aortic aneurysm Abdominal aortic aneurysm  Hypertension  Blindness  Hyperthyroidism   Mesenteric artery stenosis (CT in 9/22) Aortic atherosclerosis   Prior CV studies: Echocardiogram 05/20/2018  1. The left ventricle has normal systolic function with an ejection  fraction of 60-65%. The cavity size was normal. There is mildly increased  left ventricular wall thickness. Left ventricular diastology could not be  evaluated due to nondiagnostic  images. No evidence of left ventricular regional wall motion  abnormalities.   2. The right ventricle has normal systolic function. The cavity was  normal. There is no increase in right ventricular wall thickness. Right  ventricular systolic pressure is moderately elevated with an estimated  pressure of 42.8 mmHg.   3. The pericardial effusion is posterior to the left ventricle.   4. Trivial pericardial effusion.   5. The mitral valve is normal in structure.   6. The tricuspid valve is normal in structure.   7. The aortic valve is tricuspid Mild sclerosis of the aortic valve.   8. The pulmonic valve was normal in structure.   9. The inferior vena cava was  dilated in size with >50% respiratory  variability.  10. No evidence of left ventricular regional wall motion abnormalities.  11. Right atrial pressure is estimated at 8 mmHg.   CARDIAC TELEMETRY MONITORING-INTERPRETATION ONLY 09/14/2016 Narrative  Paroxysmal Atrial fibrillation present (2%) with occasional rapid ventricular response (RVR). Majortiy of AFIB is just above 100 BPM  No pauses > 3 seconds  Majority of tracings are normal sinus rhythm (98%)   History of Present Illness: Mr. Lachney was last seen by Dr. [name] in 6/21.  Pt was to be set up for DCCV after documented therapeutic INR for 3 weeks.  He missed several appts for his INR and DCCV was never done.  He was seen in the ED 12/10/20 for abd pain.  CT showed stable type B aortic dissection as well as thoracic aortic arch aneurysm and AAA.  TCTS and Vascular Surgery were consulted.  CT findings were felt to stable.  He returns for f/u.  ***        Past Medical History:  Diagnosis Date   Arthritis    hands and shoulders   Blindness and low vision    "Stargardt disease"   Dissection of aorta ([place]) 2004   a. s/p extensive repeair in [id number] in [place] complicated by ESRD, lower extremity paralysis, coma, and extended hospitalization of 2 years   ESRD (end stage renal disease) ([place])    a. TTS   Headache    History of cardioversion 2014   Hypertension  Neuropathy    Non-healing non-surgical wound 03/2016   PAF (paroxysmal atrial fibrillation) ([place])    a. s/p DCCV in 2014; b. on Coumadin; c. CHADS2VASc => 2 (HTN, vascular disease)   Paralysis ([place])    due to dissection of aorta in 2004, lower extremities   Pneumonia     Current Medications: No outpatient medications have been marked as taking for the 12/14/20 encounter (Appointment) with Richardson Dopp T, PA-C.     Allergies:   Ciprofloxacin, Heparin, and Quinolones   Social History   Tobacco Use   Smoking status: Never   Smokeless tobacco: Never  Vaping Use   Vaping  Use: Never used  Substance Use Topics   Alcohol use: No   Drug use: No     Family Hx: The patient's family history includes Cancer in his father and mother; Hypertension in his father and mother; Stroke in his brother; Thyroid disease in his sister.  ROS   EKGs/Labs/Other Test Reviewed:    EKG:  EKG is *** ordered today.  The ekg ordered today demonstrates ***  Recent Labs: 12/10/2020: ALT 8; BUN 57; Creatinine, Ser 12.22; Hemoglobin 11.1; Platelets 146; Potassium 4.5; Sodium 136   Recent Lipid Panel Lab Results  Component Value Date/Time   CHOL 150 05/20/2018 02:43 AM   TRIG 148 05/20/2018 02:43 AM   HDL 27 (L) 05/20/2018 02:43 AM   LDLCALC 93 05/20/2018 02:43 AM      Risk Assessment/Calculations:   {Does this patient have ATRIAL FIBRILLATION?[phone]}      Physical Exam:    VS:  There were no vitals taken for this visit.    Wt Readings from Last 3 Encounters:  12/10/20 185 lb (83.9 kg)  09/04/19 164 lb (74.4 kg)  08/12/19 164 lb 3.9 oz (74.5 kg)     Physical Exam ***     ASSESSMENT & PLAN:    {Select for Dx:25819}    {Are you ordering a CV Procedure (e.g. stress test, cath, DCCV, TEE, etc)?   Press F2        :[id number]    Dispo:  No follow-ups on file.   Medication Adjustments/Labs and Tests Ordered: Current medicines are reviewed at length with the patient today.  Concerns regarding medicines are outlined above.  Tests Ordered: No orders of the defined types were placed in this encounter.  Medication Changes: No orders of the defined types were placed in this encounter.   Signed, Richardson Dopp, PA-C  12/13/2020 4:53 PM    [facility] [place], [place]  [address] Phone: [phone]; Fax: [phone]

## 2020-12-14 ENCOUNTER — Ambulatory Visit: Payer: Medicare Other | Admitting: Physician Assistant

## 2020-12-14 DIAGNOSIS — I7101 Dissection of thoracic aorta: Secondary | ICD-10-CM

## 2020-12-14 DIAGNOSIS — I714 Abdominal aortic aneurysm, without rupture: Secondary | ICD-10-CM

## 2020-12-14 DIAGNOSIS — I716 Thoracoabdominal aortic aneurysm, without rupture: Secondary | ICD-10-CM

## 2020-12-14 DIAGNOSIS — I480 Paroxysmal atrial fibrillation: Secondary | ICD-10-CM

## 2020-12-14 DIAGNOSIS — I10 Essential (primary) hypertension: Secondary | ICD-10-CM

## 2020-12-14 DIAGNOSIS — N186 End stage renal disease: Secondary | ICD-10-CM

## 2020-12-15 DIAGNOSIS — Z992 Dependence on renal dialysis: Secondary | ICD-10-CM | POA: Diagnosis not present

## 2020-12-15 DIAGNOSIS — D509 Iron deficiency anemia, unspecified: Secondary | ICD-10-CM | POA: Diagnosis not present

## 2020-12-15 DIAGNOSIS — N186 End stage renal disease: Secondary | ICD-10-CM | POA: Diagnosis not present

## 2020-12-15 DIAGNOSIS — D631 Anemia in chronic kidney disease: Secondary | ICD-10-CM | POA: Diagnosis not present

## 2020-12-15 DIAGNOSIS — N2581 Secondary hyperparathyroidism of renal origin: Secondary | ICD-10-CM | POA: Diagnosis not present

## 2020-12-16 ENCOUNTER — Other Ambulatory Visit: Payer: Self-pay

## 2020-12-16 ENCOUNTER — Ambulatory Visit (INDEPENDENT_AMBULATORY_CARE_PROVIDER_SITE_OTHER): Payer: Medicare Other | Admitting: *Deleted

## 2020-12-16 ENCOUNTER — Other Ambulatory Visit: Payer: Self-pay | Admitting: *Deleted

## 2020-12-16 DIAGNOSIS — I4891 Unspecified atrial fibrillation: Secondary | ICD-10-CM

## 2020-12-16 DIAGNOSIS — Z5181 Encounter for therapeutic drug level monitoring: Secondary | ICD-10-CM

## 2020-12-16 DIAGNOSIS — Z7901 Long term (current) use of anticoagulants: Secondary | ICD-10-CM

## 2020-12-16 LAB — POCT INR: INR: 5.8 — AB (ref 2.0–3.0)

## 2020-12-16 MED ORDER — WARFARIN SODIUM 5 MG PO TABS: ORAL_TABLET | ORAL | 0 refills | Status: DC

## 2020-12-16 MED ORDER — AMIODARONE HCL 200 MG PO TABS: 200.0000 mg | ORAL_TABLET | Freq: Every day | ORAL | 0 refills | Status: DC

## 2020-12-16 NOTE — Telephone Encounter (Signed)
Pt in for Anticoagulation Appt. Pt requested refill on Warfarin and Amiodarone. He has an appt set for January 2023, took pt to checkout to see if anything sooner. The check out personnel assisted pt with an appt on this Friday (9/16) at 8am. Pt is aware that I will send in a 30 day supply for Warfarin and I would send the Amiodarone refill to the refill dept to evaluate. He states he has 3 pills left on Amio. Advised he must adhere to Friday's appt and he verbalized understanding.

## 2020-12-16 NOTE — Patient Instructions (Signed)
Description   Do not take any Warfarin tomorrow, No Warfarin Friday, and No Warfarin Saturday then start taking warfarin 1 tablet daily except for 1/2 tablet on Mondays, Wednesdays and Saturdays. Recheck INR in 1 week. Coumadin Clinic [phone].

## 2020-12-18 ENCOUNTER — Ambulatory Visit (INDEPENDENT_AMBULATORY_CARE_PROVIDER_SITE_OTHER): Payer: Medicare Other | Admitting: Cardiology

## 2020-12-18 ENCOUNTER — Encounter: Payer: Self-pay | Admitting: Cardiology

## 2020-12-18 ENCOUNTER — Other Ambulatory Visit: Payer: Self-pay

## 2020-12-18 DIAGNOSIS — Z992 Dependence on renal dialysis: Secondary | ICD-10-CM | POA: Diagnosis not present

## 2020-12-18 DIAGNOSIS — N186 End stage renal disease: Secondary | ICD-10-CM

## 2020-12-18 DIAGNOSIS — I714 Abdominal aortic aneurysm, without rupture, unspecified: Secondary | ICD-10-CM | POA: Insufficient documentation

## 2020-12-18 DIAGNOSIS — I716 Thoracoabdominal aortic aneurysm, without rupture, unspecified: Secondary | ICD-10-CM

## 2020-12-18 DIAGNOSIS — I482 Chronic atrial fibrillation, unspecified: Secondary | ICD-10-CM

## 2020-12-18 MED ORDER — AMIODARONE HCL 200 MG PO TABS: 200.0000 mg | ORAL_TABLET | Freq: Every day | ORAL | 3 refills | Status: DC

## 2020-12-18 MED ORDER — METOPROLOL TARTRATE 25 MG PO TABS: 12.5000 mg | ORAL_TABLET | Freq: Two times a day (BID) | ORAL | 3 refills | Status: DC

## 2020-12-18 NOTE — Assessment & Plan Note (Signed)
Recent consultation with Dr. Roxan Hockey who reviewed CT scan from 12/10/2020.  Stable appearance.  5.2 cm diameter.

## 2020-12-18 NOTE — Progress Notes (Signed)
Cardiology Office Note:    Date:  12/18/2020   ID:  Corey [name], DOB 12/30/1970, MRN [id number]  PCP:  Kerin Perna, NP   [facility] Providers Cardiologist:  Candee Furbish, MD     Referring MD: Kerin Perna, NP     History of Present Illness:    Corey [name] is a 50 y.o. male with chronic type B aortic dissection monitored by cardiothoracic surgery with chronic paraplegia related to aortic dissection and subsequent repair in 2004 in [place] here for follow-up.  Last CT scan of chest abdomen on 12/10/2020 showed the following: Unchanged contour and caliber of Stanford type B thoracic aortic dissection which extends into the abdomen ending at the level of the renal arteries. Maximum thoracic arch dimension 5.2 cm (previously 5.0 cm). Recommend semi-annual imaging followup by CTA or MRA and referral to cardiothoracic surgery if not already obtained. This recommendation follows 2010 ACCF/AHA/AATS/ACR/ASA/SCA/SCAI/SIR/STS/SVM Guidelines for the Diagnosis and Management of Patients With Thoracic Aortic Disease. Circulation. 2010; [id number]. Aortic aneurysm NOS (ICD10-I71.9) 2. New mild aneurysmal dilatation of the abdominal aorta measuring up to 3 cm with new small ulcerated plaque/saccular aneurysm. 3. Stable severe focal stenosis origin of the inferior mesenteric artery. Inferior mesenteric artery is otherwise patent. 4. Bilateral lower lobe ground-glass opacities with areas of air trapping. Findings can be seen with small airways disease. 5. Questionable wall thickening of the stomach. Correlate for gastritis. 6. Small amount of ascites. 7. Mild diffuse pancreatic ductal dilatation. A follow-up pancreatic MRI is recommended non emergently. 8. Skin thickening and irregularity of the left gluteal fold. Findings may be related to infection. Please correlate clinically. 9. Aortic Atherosclerosis (ICD10-I70.0). Aortic aneurysm NOS (ICD10-I71.9).   He was in the  emergency department with abdominal discomfort on 12/10/2020.  CT scan reviewed with Dr. Roxan Hockey by ER physician.  He found that the thoracic portion seem to be stable.  He suggested consulting vascular surgery regarding the infrarenal saccular aneurysm.  Dr. Unk Lightning with vascular surgery was consulted and thought that this was stable and did not suspect an acute vascular process.  Has end-stage renal disease on hemodialysis.  Other issues include blindness hypertension.  He is on chronic warfarin therapy.  Today he is feeling well.  No further abdominal pain.  No chest pain no shortness of breath.  Takes scat transportation.  Past Medical History:  Diagnosis Date   Arthritis    hands and shoulders   Blindness and low vision    "Stargardt disease"   Dissection of aorta ([place]) 2004   a. s/p extensive repeair in [id number] in [place] complicated by ESRD, lower extremity paralysis, coma, and extended hospitalization of 2 years   ESRD (end stage renal disease) ([place])    a. TTS   Headache    History of cardioversion 2014   Hypertension    Neuropathy    Non-healing non-surgical wound 03/2016   PAF (paroxysmal atrial fibrillation) ([place])    a. s/p DCCV in 2014; b. on Coumadin; c. CHADS2VASc => 2 (HTN, vascular disease)   Paralysis ([place])    due to dissection of aorta in 2004, lower extremities   Pneumonia     Past Surgical History:  Procedure Laterality Date   APPLICATION OF WOUND VAC Left 04/13/2016   Procedure: APPLICATION OF WOUND VAC;  Surgeon: Conrad Florence, MD;  Location: [place];  Service: Vascular;  Laterality: Left;   APPLICATION OF WOUND VAC Left 04/18/2016   Procedure: APPLICATION OF WOUND VAC;  Surgeon: Waynetta Sandy, MD;  Location: [place];  Service: Vascular;  Laterality: Left;  Wound vac change    APPLICATION OF WOUND VAC Left 04/20/2016   Procedure: WOUND VAC CHANGE;  Surgeon: Conrad George, MD;  Location: [place];  Service: Vascular;  Laterality: Left;   AV FISTULA PLACEMENT      [place] Left 12/09/2015   Procedure: FIRST STAGE BASILIC VEIN TRANSPOSITION LEFT UPPER ARM;  Surgeon: Conrad [place], MD;  Location: [place];  Service: Vascular;  Laterality: Left;   [place] Left 03/09/2016   Procedure: SECOND STAGE BASILIC VEIN TRANSPOSITION WITH REVISION OF ANASTOMOSIS LEFT UPPER ARM;  Surgeon: Conrad [place], MD;  Location: [place];  Service: Vascular;  Laterality: Left;   CARDIOVERSION     CARDIOVERSION N/A 01/04/2019   Procedure: CARDIOVERSION;  Surgeon: Lelon Perla, MD;  [facility]: [facility];  Service: Cardiovascular;  Laterality: N/A;   REPAIR OF ACUTE ASCENDING THORACIC AORTIC DISSECTION     REVISON OF ARTERIOVENOUS FISTULA Left 04/20/2016   Procedure: LIGATION OF BASILIC VEIN TRANSPOSITION;  Surgeon: Conrad [place], MD;  Location: [place];  Service: Vascular;  Laterality: Left;   WOUND DEBRIDEMENT Left 04/13/2016   Procedure: DEBRIDEMENT WOUND;  Surgeon: Conrad Milburn, MD;  [facility];  Service: Vascular;  Laterality: Left;    Current Medications: Current Meds  Medication Sig   cinacalcet (SENSIPAR) 60 MG tablet Take 120 mg by mouth every evening.   docusate sodium (COLACE) 100 MG capsule Take 1 capsule (100 mg total) by mouth every 12 (twelve) hours.   Multiple Vitamins-Minerals (MULTIVITAMIN WITH MINERALS) tablet Take 1 tablet by mouth daily.   oxyCODONE-acetaminophen (PERCOCET) 5-325 MG tablet Take 1 tablet by mouth every 4 (four) hours as needed.   RENVELA 2.4 g PACK Take 4.8 g by mouth in the morning, at noon, and at bedtime.   warfarin (COUMADIN) 5 MG tablet Take 1/2 tablet to 1 tablet by mouth daily as directed by the Anticoagulation Clinic.   [DISCONTINUED] amiodarone (PACERONE) 200 MG tablet Take 1 tablet (200 mg total) by mouth daily. Please keep your upcoming appointment for any future refills. Thank you.   [DISCONTINUED] metoprolol tartrate (LOPRESSOR) 25 MG tablet Take 0.5 tablets (12.5 mg total) by mouth 2 (two) times  daily. Hold on the AM of dialysis days (Patient taking differently: Take 12.5 mg by mouth 2 (two) times daily. Take on Wednesday, Thursday and Friday Hold on the AM of dialysis days)     Allergies:   Ciprofloxacin, Heparin, and Quinolones   Social History   Socioeconomic History   Marital status: Divorced    Spouse name: Not on file   Number of children: 2   Years of education: Not on file   Highest education level: Not on file  Occupational History   Occupation: DIABLED  Tobacco Use   Smoking status: Never   Smokeless tobacco: Never  Vaping Use   Vaping Use: Never used  Substance and Sexual Activity   Alcohol use: No   Drug use: No   Sexual activity: Not on file  Other Topics Concern   Not on file  Social History Narrative   Not on file   Social Determinants of Health   Financial Resource Strain: Not on file  Food Insecurity: Not on file  Transportation Needs: Not on file  Physical Activity: Not on file  Stress: Not on file  Social Connections: Not on file     Family  History: The patient's family history includes Cancer in his father and mother; Hypertension in his father and mother; Stroke in his brother; Thyroid disease in his sister.  ROS:   Please see the history of present illness.     All other systems reviewed and are negative.  EKGs/Labs/Other Studies Reviewed:    The following studies were reviewed today: Echo 05/20/18  1. The left ventricle has normal systolic function with an ejection fraction of 60-65%. The cavity size was normal. There is mildly increased left ventricular wall thickness. Left ventricular diastology could not be evaluated due to nondiagnostic  images. No evidence of left ventricular regional wall motion abnormalities.  2. The right ventricle has normal systolic function. The cavity was normal. There is no increase in right ventricular wall thickness. Right ventricular systolic pressure is moderately elevated with an estimated pressure  of 42.8 mmHg.  3. The pericardial effusion is posterior to the left ventricle.  4. Trivial pericardial effusion.  5. The mitral valve is normal in structure.  6. The tricuspid valve is normal in structure.  7. The aortic valve is tricuspid Mild sclerosis of the aortic valve.  8. The pulmonic valve was normal in structure.  9. The inferior vena cava was dilated in size with >50% respiratory variability. 10. No evidence of left ventricular regional wall motion abnormalities. 11. Right atrial pressure is estimated at 8 mmHg.    EKG:  EKG is  ordered today.  The ekg ordered today demonstrate atrial flutter/atrial tachycardia with variable conduction heart rate 80 bpm poor R wave progression, no significant change from prior EKG last year  Recent Labs: 12/10/2020: ALT 8; BUN 57; Creatinine, Ser 12.22; Hemoglobin 11.1; Platelets 146; Potassium 4.5; Sodium 136  Recent Lipid Panel    Component Value Date/Time   CHOL 150 05/20/2018 0243   TRIG 148 05/20/2018 0243   HDL 27 (L) 05/20/2018 0243   CHOLHDL 5.6 05/20/2018 0243   VLDL 30 05/20/2018 0243   LDLCALC 93 05/20/2018 0243          Physical Exam:    VS:  BP 110/76 (BP Location: Left Arm, Patient Position: Sitting, Cuff Size: Normal)   Pulse 88   Ht '6\' 2"'$  (1.88 m)   Wt 180 lb (81.6 kg)   SpO2 94%   BMI 23.11 kg/m     Wt Readings from Last 3 Encounters:  12/18/20 180 lb (81.6 kg)  12/10/20 185 lb (83.9 kg)  09/04/19 164 lb (74.4 kg)     GEN: In wheelchair well nourished, well developed in no acute distress HEENT: Decreased vision NECK: No JVD; No carotid bruits LYMPHATICS: No lymphadenopathy CARDIAC: Slightly irregular normal rate no murmurs, rubs, gallops RESPIRATORY:  Clear to auscultation without rales, wheezing or rhonchi  ABDOMEN: Soft, non-tender, non-distended MUSCULOSKELETAL:  No edema; No deformity  SKIN: Warm and dry NEUROLOGIC:  Alert and oriented x 3, lower extremity weakness PSYCHIATRIC:  Normal affect    ASSESSMENT:    1. Atrial fibrillation, chronic ([place])   2. 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 AAA (abdominal aortic aneurysm) without rupture ([place])   4. ESRD on dialysis [facility])    PLAN:    In order of problems listed above:  Atrial fibrillation, chronic (HCC) Currently in atrial flutter with heart rate 88 bpm variable conduction.  Stable from prior EKG with heart rate slightly less.  Continue with medical management with amiodarone as well as metoprolol at current dosing.  Refills on amiodarone.  High  risk medication.  Continue with lab monitoring.  Thoracoabdominal aortic aneurysm (TAAA) without rupture [facility]) Recent consultation with Dr. Roxan Hockey who reviewed CT scan from 12/10/2020.  Stable appearance.  5.2 cm diameter.  AAA (abdominal aortic aneurysm) without rupture (HCC) Small 3 cm ulcerated plaque saccular aneurysm.  Evaluated by vascular surgery.  Continue with surveillance.  No emergent needs at this time.  ESRD on dialysis [facility]) Note, mild pancreatic ductal dilatation was noted on CT scan.  Radiology recommended a pancreatic MRI for further evaluation, however he is on end-stage renal disease with hemodialysis.  Gadolinium could have adverse effect in this situation.  Based upon his clinical improvement and no evidence of markedly elevated LFTs, continue to monitor.  If primary care physician feels the need to proceed with MRI, this may need to be scheduled with dialysis following shortly thereafter gadolinium administration.   Time spent 41 minutes with patient review of data review of CT scans review of ER notes.  High risk medication management with amiodarone.  Warfarin.     Medication Adjustments/Labs and Tests Ordered: Current medicines are reviewed at length with the patient today.  Concerns regarding medicines are outlined above.  Orders Placed This Encounter  Procedures   EKG 12-Lead   Meds ordered this encounter  Medications    amiodarone (PACERONE) 200 MG tablet    Sig: Take 1 tablet (200 mg total) by mouth daily.    Dispense:  90 tablet    Refill:  3   metoprolol tartrate (LOPRESSOR) 25 MG tablet    Sig: Take 0.5 tablets (12.5 mg total) by mouth 2 (two) times daily. Hold on the AM of dialysis days    Dispense:  90 tablet    Refill:  3    Patient Instructions  Medication Instructions:  The current medical regimen is effective;  continue present plan and medications.  *If you need a refill on your cardiac medications before your next appointment, please call your pharmacy*  Follow-Up: At [facility], you and your health needs are our priority.  As part of our continuing mission to provide you with exceptional heart care, we have created designated Provider Care Teams.  These Care Teams include your primary Cardiologist (physician) and Advanced Practice Providers (APPs -  Physician Assistants and Nurse Practitioners) who all work together to provide you with the care you need, when you need it.  We recommend signing up for the patient portal called "MyChart".  Sign up information is provided on this After Visit Summary.  MyChart is used to connect with patients for Virtual Visits (Telemedicine).  Patients are able to view lab/test results, encounter notes, upcoming appointments, etc.  Non-urgent messages can be sent to your provider as well.   To learn more about what you can do with MyChart, go to [email or web address].    Your next appointment:   1 year(s)  The format for your next appointment:   In Person  Provider:   Candee Furbish, MD   Thank you for choosing [facility]!!    Please schedule to be seen by VVS for follow up of abdominal aortic aneurysm.   Signed, Candee Furbish, MD  12/18/2020 8:49 AM    [facility]

## 2020-12-18 NOTE — Patient Instructions (Signed)
Medication Instructions:  The current medical regimen is effective;  continue present plan and medications.  *If you need a refill on your cardiac medications before your next appointment, please call your pharmacy*  Follow-Up: At [facility], you and your health needs are our priority.  As part of our continuing mission to provide you with exceptional heart care, we have created designated Provider Care Teams.  These Care Teams include your primary Cardiologist (physician) and Advanced Practice Providers (APPs -  Physician Assistants and Nurse Practitioners) who all work together to provide you with the care you need, when you need it.  We recommend signing up for the patient portal called "MyChart".  Sign up information is provided on this After Visit Summary.  MyChart is used to connect with patients for Virtual Visits (Telemedicine).  Patients are able to view lab/test results, encounter notes, upcoming appointments, etc.  Non-urgent messages can be sent to your provider as well.   To learn more about what you can do with MyChart, go to [email or web address].    Your next appointment:   1 year(s)  The format for your next appointment:   In Person  Provider:   Candee Furbish, MD   Thank you for choosing [facility]!!    Please schedule to be seen by VVS for follow up of abdominal aortic aneurysm.

## 2020-12-18 NOTE — Assessment & Plan Note (Signed)
Currently in atrial flutter with heart rate 88 bpm variable conduction.  Stable from prior EKG with heart rate slightly less.  Continue with medical management with amiodarone as well as metoprolol at current dosing.  Refills on amiodarone.  High risk medication.  Continue with lab monitoring.

## 2020-12-18 NOTE — Assessment & Plan Note (Signed)
Small 3 cm ulcerated plaque saccular aneurysm.  Evaluated by vascular surgery.  Continue with surveillance.  No emergent needs at this time.

## 2020-12-18 NOTE — Assessment & Plan Note (Signed)
Note, mild pancreatic ductal dilatation was noted on CT scan.  Radiology recommended a pancreatic MRI for further evaluation, however he is on end-stage renal disease with hemodialysis.  Gadolinium could have adverse effect in this situation.  Based upon his clinical improvement and no evidence of markedly elevated LFTs, continue to monitor.  If primary care physician feels the need to proceed with MRI, this may need to be scheduled with dialysis following shortly thereafter gadolinium administration.

## 2020-12-19 DIAGNOSIS — D689 Coagulation defect, unspecified: Secondary | ICD-10-CM | POA: Diagnosis not present

## 2020-12-19 DIAGNOSIS — N186 End stage renal disease: Secondary | ICD-10-CM | POA: Diagnosis not present

## 2020-12-19 DIAGNOSIS — D509 Iron deficiency anemia, unspecified: Secondary | ICD-10-CM | POA: Diagnosis not present

## 2020-12-19 DIAGNOSIS — Z992 Dependence on renal dialysis: Secondary | ICD-10-CM | POA: Diagnosis not present

## 2020-12-19 DIAGNOSIS — N2581 Secondary hyperparathyroidism of renal origin: Secondary | ICD-10-CM | POA: Diagnosis not present

## 2020-12-19 DIAGNOSIS — D631 Anemia in chronic kidney disease: Secondary | ICD-10-CM | POA: Diagnosis not present

## 2020-12-21 DIAGNOSIS — I7102 Dissection of abdominal aorta: Secondary | ICD-10-CM | POA: Diagnosis not present

## 2020-12-21 DIAGNOSIS — I712 Thoracic aortic aneurysm, without rupture: Secondary | ICD-10-CM | POA: Diagnosis not present

## 2020-12-22 DIAGNOSIS — Z992 Dependence on renal dialysis: Secondary | ICD-10-CM | POA: Diagnosis not present

## 2020-12-22 DIAGNOSIS — D509 Iron deficiency anemia, unspecified: Secondary | ICD-10-CM | POA: Diagnosis not present

## 2020-12-22 DIAGNOSIS — D631 Anemia in chronic kidney disease: Secondary | ICD-10-CM | POA: Diagnosis not present

## 2020-12-22 DIAGNOSIS — N2581 Secondary hyperparathyroidism of renal origin: Secondary | ICD-10-CM | POA: Diagnosis not present

## 2020-12-22 DIAGNOSIS — N186 End stage renal disease: Secondary | ICD-10-CM | POA: Diagnosis not present

## 2020-12-23 ENCOUNTER — Ambulatory Visit (INDEPENDENT_AMBULATORY_CARE_PROVIDER_SITE_OTHER): Payer: Medicare Other | Admitting: *Deleted

## 2020-12-23 ENCOUNTER — Other Ambulatory Visit: Payer: Self-pay

## 2020-12-23 DIAGNOSIS — I4891 Unspecified atrial fibrillation: Secondary | ICD-10-CM

## 2020-12-23 DIAGNOSIS — Z7901 Long term (current) use of anticoagulants: Secondary | ICD-10-CM | POA: Diagnosis not present

## 2020-12-23 LAB — POCT INR: INR: 4.3 — AB (ref 2.0–3.0)

## 2020-12-23 NOTE — Patient Instructions (Signed)
Description   Do not take any Warfarin tomorrow, No Warfarin Friday and then start taking warfarin 1/2 tablet daily. Recheck INR in 1 week. Coumadin Clinic [phone].

## 2020-12-26 DIAGNOSIS — N2581 Secondary hyperparathyroidism of renal origin: Secondary | ICD-10-CM | POA: Diagnosis not present

## 2020-12-26 DIAGNOSIS — Z992 Dependence on renal dialysis: Secondary | ICD-10-CM | POA: Diagnosis not present

## 2020-12-26 DIAGNOSIS — D631 Anemia in chronic kidney disease: Secondary | ICD-10-CM | POA: Diagnosis not present

## 2020-12-26 DIAGNOSIS — N186 End stage renal disease: Secondary | ICD-10-CM | POA: Diagnosis not present

## 2020-12-26 DIAGNOSIS — D509 Iron deficiency anemia, unspecified: Secondary | ICD-10-CM | POA: Diagnosis not present

## 2020-12-29 NOTE — Telephone Encounter (Signed)
Called pt, left message.

## 2020-12-31 DIAGNOSIS — Z992 Dependence on renal dialysis: Secondary | ICD-10-CM | POA: Diagnosis not present

## 2020-12-31 DIAGNOSIS — D631 Anemia in chronic kidney disease: Secondary | ICD-10-CM | POA: Diagnosis not present

## 2020-12-31 DIAGNOSIS — N186 End stage renal disease: Secondary | ICD-10-CM | POA: Diagnosis not present

## 2020-12-31 DIAGNOSIS — N2581 Secondary hyperparathyroidism of renal origin: Secondary | ICD-10-CM | POA: Diagnosis not present

## 2020-12-31 DIAGNOSIS — D509 Iron deficiency anemia, unspecified: Secondary | ICD-10-CM | POA: Diagnosis not present

## 2021-01-02 DIAGNOSIS — I129 Hypertensive chronic kidney disease with stage 1 through stage 4 chronic kidney disease, or unspecified chronic kidney disease: Secondary | ICD-10-CM | POA: Diagnosis not present

## 2021-01-02 DIAGNOSIS — Z992 Dependence on renal dialysis: Secondary | ICD-10-CM | POA: Diagnosis not present

## 2021-01-02 DIAGNOSIS — D689 Coagulation defect, unspecified: Secondary | ICD-10-CM | POA: Diagnosis not present

## 2021-01-02 DIAGNOSIS — N2581 Secondary hyperparathyroidism of renal origin: Secondary | ICD-10-CM | POA: Diagnosis not present

## 2021-01-02 DIAGNOSIS — D509 Iron deficiency anemia, unspecified: Secondary | ICD-10-CM | POA: Diagnosis not present

## 2021-01-02 DIAGNOSIS — N186 End stage renal disease: Secondary | ICD-10-CM | POA: Diagnosis not present

## 2021-01-02 DIAGNOSIS — D631 Anemia in chronic kidney disease: Secondary | ICD-10-CM | POA: Diagnosis not present

## 2021-01-07 DIAGNOSIS — I482 Chronic atrial fibrillation, unspecified: Secondary | ICD-10-CM | POA: Diagnosis not present

## 2021-01-07 DIAGNOSIS — N186 End stage renal disease: Secondary | ICD-10-CM | POA: Diagnosis not present

## 2021-01-07 DIAGNOSIS — D509 Iron deficiency anemia, unspecified: Secondary | ICD-10-CM | POA: Diagnosis not present

## 2021-01-07 DIAGNOSIS — D689 Coagulation defect, unspecified: Secondary | ICD-10-CM | POA: Diagnosis not present

## 2021-01-07 DIAGNOSIS — N2581 Secondary hyperparathyroidism of renal origin: Secondary | ICD-10-CM | POA: Diagnosis not present

## 2021-01-07 DIAGNOSIS — Z992 Dependence on renal dialysis: Secondary | ICD-10-CM | POA: Diagnosis not present

## 2021-01-07 DIAGNOSIS — D631 Anemia in chronic kidney disease: Secondary | ICD-10-CM | POA: Diagnosis not present

## 2021-01-09 DIAGNOSIS — Z992 Dependence on renal dialysis: Secondary | ICD-10-CM | POA: Diagnosis not present

## 2021-01-09 DIAGNOSIS — D509 Iron deficiency anemia, unspecified: Secondary | ICD-10-CM | POA: Diagnosis not present

## 2021-01-09 DIAGNOSIS — D631 Anemia in chronic kidney disease: Secondary | ICD-10-CM | POA: Diagnosis not present

## 2021-01-09 DIAGNOSIS — D689 Coagulation defect, unspecified: Secondary | ICD-10-CM | POA: Diagnosis not present

## 2021-01-09 DIAGNOSIS — N2581 Secondary hyperparathyroidism of renal origin: Secondary | ICD-10-CM | POA: Diagnosis not present

## 2021-01-09 DIAGNOSIS — N186 End stage renal disease: Secondary | ICD-10-CM | POA: Diagnosis not present

## 2021-01-14 DIAGNOSIS — D509 Iron deficiency anemia, unspecified: Secondary | ICD-10-CM | POA: Diagnosis not present

## 2021-01-14 DIAGNOSIS — Z992 Dependence on renal dialysis: Secondary | ICD-10-CM | POA: Diagnosis not present

## 2021-01-14 DIAGNOSIS — D689 Coagulation defect, unspecified: Secondary | ICD-10-CM | POA: Diagnosis not present

## 2021-01-14 DIAGNOSIS — N2581 Secondary hyperparathyroidism of renal origin: Secondary | ICD-10-CM | POA: Diagnosis not present

## 2021-01-14 DIAGNOSIS — D631 Anemia in chronic kidney disease: Secondary | ICD-10-CM | POA: Diagnosis not present

## 2021-01-14 DIAGNOSIS — N186 End stage renal disease: Secondary | ICD-10-CM | POA: Diagnosis not present

## 2021-01-16 DIAGNOSIS — D689 Coagulation defect, unspecified: Secondary | ICD-10-CM | POA: Diagnosis not present

## 2021-01-16 DIAGNOSIS — N2581 Secondary hyperparathyroidism of renal origin: Secondary | ICD-10-CM | POA: Diagnosis not present

## 2021-01-16 DIAGNOSIS — Z992 Dependence on renal dialysis: Secondary | ICD-10-CM | POA: Diagnosis not present

## 2021-01-16 DIAGNOSIS — D631 Anemia in chronic kidney disease: Secondary | ICD-10-CM | POA: Diagnosis not present

## 2021-01-16 DIAGNOSIS — D509 Iron deficiency anemia, unspecified: Secondary | ICD-10-CM | POA: Diagnosis not present

## 2021-01-16 DIAGNOSIS — N186 End stage renal disease: Secondary | ICD-10-CM | POA: Diagnosis not present

## 2021-01-21 DIAGNOSIS — D689 Coagulation defect, unspecified: Secondary | ICD-10-CM | POA: Diagnosis not present

## 2021-01-21 DIAGNOSIS — D631 Anemia in chronic kidney disease: Secondary | ICD-10-CM | POA: Diagnosis not present

## 2021-01-21 DIAGNOSIS — N186 End stage renal disease: Secondary | ICD-10-CM | POA: Diagnosis not present

## 2021-01-21 DIAGNOSIS — N2581 Secondary hyperparathyroidism of renal origin: Secondary | ICD-10-CM | POA: Diagnosis not present

## 2021-01-21 DIAGNOSIS — D509 Iron deficiency anemia, unspecified: Secondary | ICD-10-CM | POA: Diagnosis not present

## 2021-01-21 DIAGNOSIS — Z992 Dependence on renal dialysis: Secondary | ICD-10-CM | POA: Diagnosis not present

## 2021-01-23 DIAGNOSIS — N2581 Secondary hyperparathyroidism of renal origin: Secondary | ICD-10-CM | POA: Diagnosis not present

## 2021-01-23 DIAGNOSIS — N186 End stage renal disease: Secondary | ICD-10-CM | POA: Diagnosis not present

## 2021-01-23 DIAGNOSIS — Z992 Dependence on renal dialysis: Secondary | ICD-10-CM | POA: Diagnosis not present

## 2021-01-23 DIAGNOSIS — D509 Iron deficiency anemia, unspecified: Secondary | ICD-10-CM | POA: Diagnosis not present

## 2021-01-23 DIAGNOSIS — D689 Coagulation defect, unspecified: Secondary | ICD-10-CM | POA: Diagnosis not present

## 2021-01-23 DIAGNOSIS — D631 Anemia in chronic kidney disease: Secondary | ICD-10-CM | POA: Diagnosis not present

## 2021-01-28 DIAGNOSIS — D631 Anemia in chronic kidney disease: Secondary | ICD-10-CM | POA: Diagnosis not present

## 2021-01-28 DIAGNOSIS — N186 End stage renal disease: Secondary | ICD-10-CM | POA: Diagnosis not present

## 2021-01-28 DIAGNOSIS — D509 Iron deficiency anemia, unspecified: Secondary | ICD-10-CM | POA: Diagnosis not present

## 2021-01-28 DIAGNOSIS — Z992 Dependence on renal dialysis: Secondary | ICD-10-CM | POA: Diagnosis not present

## 2021-01-28 DIAGNOSIS — N2581 Secondary hyperparathyroidism of renal origin: Secondary | ICD-10-CM | POA: Diagnosis not present

## 2021-01-28 DIAGNOSIS — D689 Coagulation defect, unspecified: Secondary | ICD-10-CM | POA: Diagnosis not present

## 2021-01-29 ENCOUNTER — Ambulatory Visit (INDEPENDENT_AMBULATORY_CARE_PROVIDER_SITE_OTHER): Payer: Medicare Other | Admitting: *Deleted

## 2021-01-29 ENCOUNTER — Other Ambulatory Visit: Payer: Self-pay | Admitting: Cardiology

## 2021-01-29 ENCOUNTER — Other Ambulatory Visit: Payer: Self-pay

## 2021-01-29 DIAGNOSIS — I4891 Unspecified atrial fibrillation: Secondary | ICD-10-CM

## 2021-01-29 DIAGNOSIS — Z7901 Long term (current) use of anticoagulants: Secondary | ICD-10-CM | POA: Diagnosis not present

## 2021-01-29 LAB — PROTIME-INR
INR: 5.7 (ref 0.9–1.2)
Prothrombin Time: 53.1 s — ABNORMAL HIGH (ref 9.1–12.0)

## 2021-01-29 LAB — POCT INR: INR: 7 — AB (ref 2.0–3.0)

## 2021-01-29 MED ORDER — WARFARIN SODIUM 2 MG PO TABS: ORAL_TABLET | ORAL | 0 refills | Status: DC

## 2021-01-29 NOTE — Patient Instructions (Signed)
Description   STAT Lab resulted as 5.7; Called patient and advised pt not to take take any No Warfarin Saturday, No Warfarin Sunday, No Warfarin  Monday then start using a 2mg  tablet (purple tablet) and start taking warfarin 1 tablet (2mg s) daily. Recheck INR in 1 week. Coumadin Clinic [phone].

## 2021-01-30 DIAGNOSIS — D509 Iron deficiency anemia, unspecified: Secondary | ICD-10-CM | POA: Diagnosis not present

## 2021-01-30 DIAGNOSIS — N2581 Secondary hyperparathyroidism of renal origin: Secondary | ICD-10-CM | POA: Diagnosis not present

## 2021-01-30 DIAGNOSIS — D631 Anemia in chronic kidney disease: Secondary | ICD-10-CM | POA: Diagnosis not present

## 2021-01-30 DIAGNOSIS — Z992 Dependence on renal dialysis: Secondary | ICD-10-CM | POA: Diagnosis not present

## 2021-01-30 DIAGNOSIS — N186 End stage renal disease: Secondary | ICD-10-CM | POA: Diagnosis not present

## 2021-01-30 DIAGNOSIS — D689 Coagulation defect, unspecified: Secondary | ICD-10-CM | POA: Diagnosis not present

## 2021-02-02 DIAGNOSIS — N2581 Secondary hyperparathyroidism of renal origin: Secondary | ICD-10-CM | POA: Diagnosis not present

## 2021-02-02 DIAGNOSIS — I129 Hypertensive chronic kidney disease with stage 1 through stage 4 chronic kidney disease, or unspecified chronic kidney disease: Secondary | ICD-10-CM | POA: Diagnosis not present

## 2021-02-02 DIAGNOSIS — D631 Anemia in chronic kidney disease: Secondary | ICD-10-CM | POA: Diagnosis not present

## 2021-02-02 DIAGNOSIS — D509 Iron deficiency anemia, unspecified: Secondary | ICD-10-CM | POA: Diagnosis not present

## 2021-02-02 DIAGNOSIS — N186 End stage renal disease: Secondary | ICD-10-CM | POA: Diagnosis not present

## 2021-02-02 DIAGNOSIS — Z992 Dependence on renal dialysis: Secondary | ICD-10-CM | POA: Diagnosis not present

## 2021-02-05 ENCOUNTER — Telehealth: Payer: Self-pay

## 2021-02-05 NOTE — Telephone Encounter (Signed)
Missed appt. Called, no answer. LMOM

## 2021-02-06 DIAGNOSIS — Z992 Dependence on renal dialysis: Secondary | ICD-10-CM | POA: Diagnosis not present

## 2021-02-06 DIAGNOSIS — D509 Iron deficiency anemia, unspecified: Secondary | ICD-10-CM | POA: Diagnosis not present

## 2021-02-06 DIAGNOSIS — N186 End stage renal disease: Secondary | ICD-10-CM | POA: Diagnosis not present

## 2021-02-06 DIAGNOSIS — I482 Chronic atrial fibrillation, unspecified: Secondary | ICD-10-CM | POA: Diagnosis not present

## 2021-02-06 DIAGNOSIS — N2581 Secondary hyperparathyroidism of renal origin: Secondary | ICD-10-CM | POA: Diagnosis not present

## 2021-02-06 DIAGNOSIS — D631 Anemia in chronic kidney disease: Secondary | ICD-10-CM | POA: Diagnosis not present

## 2021-02-11 DIAGNOSIS — D631 Anemia in chronic kidney disease: Secondary | ICD-10-CM | POA: Diagnosis not present

## 2021-02-11 DIAGNOSIS — N2581 Secondary hyperparathyroidism of renal origin: Secondary | ICD-10-CM | POA: Diagnosis not present

## 2021-02-11 DIAGNOSIS — Z992 Dependence on renal dialysis: Secondary | ICD-10-CM | POA: Diagnosis not present

## 2021-02-11 DIAGNOSIS — N186 End stage renal disease: Secondary | ICD-10-CM | POA: Diagnosis not present

## 2021-02-11 DIAGNOSIS — D509 Iron deficiency anemia, unspecified: Secondary | ICD-10-CM | POA: Diagnosis not present

## 2021-02-13 DIAGNOSIS — D509 Iron deficiency anemia, unspecified: Secondary | ICD-10-CM | POA: Diagnosis not present

## 2021-02-13 DIAGNOSIS — D631 Anemia in chronic kidney disease: Secondary | ICD-10-CM | POA: Diagnosis not present

## 2021-02-13 DIAGNOSIS — Z992 Dependence on renal dialysis: Secondary | ICD-10-CM | POA: Diagnosis not present

## 2021-02-13 DIAGNOSIS — N2581 Secondary hyperparathyroidism of renal origin: Secondary | ICD-10-CM | POA: Diagnosis not present

## 2021-02-13 DIAGNOSIS — N186 End stage renal disease: Secondary | ICD-10-CM | POA: Diagnosis not present

## 2021-02-18 DIAGNOSIS — N2581 Secondary hyperparathyroidism of renal origin: Secondary | ICD-10-CM | POA: Diagnosis not present

## 2021-02-18 DIAGNOSIS — D509 Iron deficiency anemia, unspecified: Secondary | ICD-10-CM | POA: Diagnosis not present

## 2021-02-18 DIAGNOSIS — N186 End stage renal disease: Secondary | ICD-10-CM | POA: Diagnosis not present

## 2021-02-18 DIAGNOSIS — D631 Anemia in chronic kidney disease: Secondary | ICD-10-CM | POA: Diagnosis not present

## 2021-02-18 DIAGNOSIS — I482 Chronic atrial fibrillation, unspecified: Secondary | ICD-10-CM | POA: Diagnosis not present

## 2021-02-18 DIAGNOSIS — Z992 Dependence on renal dialysis: Secondary | ICD-10-CM | POA: Diagnosis not present

## 2021-02-20 DIAGNOSIS — D509 Iron deficiency anemia, unspecified: Secondary | ICD-10-CM | POA: Diagnosis not present

## 2021-02-20 DIAGNOSIS — N186 End stage renal disease: Secondary | ICD-10-CM | POA: Diagnosis not present

## 2021-02-20 DIAGNOSIS — N2581 Secondary hyperparathyroidism of renal origin: Secondary | ICD-10-CM | POA: Diagnosis not present

## 2021-02-20 DIAGNOSIS — Z992 Dependence on renal dialysis: Secondary | ICD-10-CM | POA: Diagnosis not present

## 2021-02-20 DIAGNOSIS — D631 Anemia in chronic kidney disease: Secondary | ICD-10-CM | POA: Diagnosis not present

## 2021-02-26 DIAGNOSIS — N2581 Secondary hyperparathyroidism of renal origin: Secondary | ICD-10-CM | POA: Diagnosis not present

## 2021-02-26 DIAGNOSIS — Z992 Dependence on renal dialysis: Secondary | ICD-10-CM | POA: Diagnosis not present

## 2021-02-26 DIAGNOSIS — D631 Anemia in chronic kidney disease: Secondary | ICD-10-CM | POA: Diagnosis not present

## 2021-02-26 DIAGNOSIS — D509 Iron deficiency anemia, unspecified: Secondary | ICD-10-CM | POA: Diagnosis not present

## 2021-02-26 DIAGNOSIS — N186 End stage renal disease: Secondary | ICD-10-CM | POA: Diagnosis not present

## 2021-03-02 DIAGNOSIS — D509 Iron deficiency anemia, unspecified: Secondary | ICD-10-CM | POA: Diagnosis not present

## 2021-03-02 DIAGNOSIS — D631 Anemia in chronic kidney disease: Secondary | ICD-10-CM | POA: Diagnosis not present

## 2021-03-02 DIAGNOSIS — Z992 Dependence on renal dialysis: Secondary | ICD-10-CM | POA: Diagnosis not present

## 2021-03-02 DIAGNOSIS — N186 End stage renal disease: Secondary | ICD-10-CM | POA: Diagnosis not present

## 2021-03-02 DIAGNOSIS — N2581 Secondary hyperparathyroidism of renal origin: Secondary | ICD-10-CM | POA: Diagnosis not present

## 2021-03-04 DIAGNOSIS — D689 Coagulation defect, unspecified: Secondary | ICD-10-CM | POA: Diagnosis not present

## 2021-03-04 DIAGNOSIS — I129 Hypertensive chronic kidney disease with stage 1 through stage 4 chronic kidney disease, or unspecified chronic kidney disease: Secondary | ICD-10-CM | POA: Diagnosis not present

## 2021-03-04 DIAGNOSIS — D631 Anemia in chronic kidney disease: Secondary | ICD-10-CM | POA: Diagnosis not present

## 2021-03-04 DIAGNOSIS — Z992 Dependence on renal dialysis: Secondary | ICD-10-CM | POA: Diagnosis not present

## 2021-03-04 DIAGNOSIS — N186 End stage renal disease: Secondary | ICD-10-CM | POA: Diagnosis not present

## 2021-03-04 DIAGNOSIS — D509 Iron deficiency anemia, unspecified: Secondary | ICD-10-CM | POA: Diagnosis not present

## 2021-03-04 DIAGNOSIS — N2581 Secondary hyperparathyroidism of renal origin: Secondary | ICD-10-CM | POA: Diagnosis not present

## 2021-03-06 DIAGNOSIS — Z992 Dependence on renal dialysis: Secondary | ICD-10-CM | POA: Diagnosis not present

## 2021-03-06 DIAGNOSIS — N2581 Secondary hyperparathyroidism of renal origin: Secondary | ICD-10-CM | POA: Diagnosis not present

## 2021-03-06 DIAGNOSIS — N186 End stage renal disease: Secondary | ICD-10-CM | POA: Diagnosis not present

## 2021-03-06 DIAGNOSIS — D509 Iron deficiency anemia, unspecified: Secondary | ICD-10-CM | POA: Diagnosis not present

## 2021-03-06 DIAGNOSIS — D689 Coagulation defect, unspecified: Secondary | ICD-10-CM | POA: Diagnosis not present

## 2021-03-11 DIAGNOSIS — N186 End stage renal disease: Secondary | ICD-10-CM | POA: Diagnosis not present

## 2021-03-11 DIAGNOSIS — Z992 Dependence on renal dialysis: Secondary | ICD-10-CM | POA: Diagnosis not present

## 2021-03-11 DIAGNOSIS — I482 Chronic atrial fibrillation, unspecified: Secondary | ICD-10-CM | POA: Diagnosis not present

## 2021-03-11 DIAGNOSIS — N2581 Secondary hyperparathyroidism of renal origin: Secondary | ICD-10-CM | POA: Diagnosis not present

## 2021-03-11 DIAGNOSIS — D689 Coagulation defect, unspecified: Secondary | ICD-10-CM | POA: Diagnosis not present

## 2021-03-11 DIAGNOSIS — D509 Iron deficiency anemia, unspecified: Secondary | ICD-10-CM | POA: Diagnosis not present

## 2021-03-13 DIAGNOSIS — Z992 Dependence on renal dialysis: Secondary | ICD-10-CM | POA: Diagnosis not present

## 2021-03-13 DIAGNOSIS — D509 Iron deficiency anemia, unspecified: Secondary | ICD-10-CM | POA: Diagnosis not present

## 2021-03-13 DIAGNOSIS — N186 End stage renal disease: Secondary | ICD-10-CM | POA: Diagnosis not present

## 2021-03-13 DIAGNOSIS — D689 Coagulation defect, unspecified: Secondary | ICD-10-CM | POA: Diagnosis not present

## 2021-03-13 DIAGNOSIS — N2581 Secondary hyperparathyroidism of renal origin: Secondary | ICD-10-CM | POA: Diagnosis not present

## 2021-03-15 ENCOUNTER — Ambulatory Visit (INDEPENDENT_AMBULATORY_CARE_PROVIDER_SITE_OTHER): Payer: Medicare Other | Admitting: *Deleted

## 2021-03-15 ENCOUNTER — Other Ambulatory Visit: Payer: Self-pay

## 2021-03-15 DIAGNOSIS — I4891 Unspecified atrial fibrillation: Secondary | ICD-10-CM | POA: Diagnosis not present

## 2021-03-15 DIAGNOSIS — Z7901 Long term (current) use of anticoagulants: Secondary | ICD-10-CM | POA: Diagnosis not present

## 2021-03-15 LAB — POCT INR: INR: 1.3 — AB (ref 2.0–3.0)

## 2021-03-15 MED ORDER — WARFARIN SODIUM 2 MG PO TABS: ORAL_TABLET | ORAL | 0 refills | Status: DC

## 2021-03-15 NOTE — Patient Instructions (Addendum)
Description   Today and tomorrow take 3mg  (1.5 tablets) then continue taking warfarin 1 tablet (2mg s) daily. Recheck INR in 1 week. Coumadin Clinic [phone].

## 2021-03-18 DIAGNOSIS — Z992 Dependence on renal dialysis: Secondary | ICD-10-CM | POA: Diagnosis not present

## 2021-03-18 DIAGNOSIS — N186 End stage renal disease: Secondary | ICD-10-CM | POA: Diagnosis not present

## 2021-03-18 DIAGNOSIS — D689 Coagulation defect, unspecified: Secondary | ICD-10-CM | POA: Diagnosis not present

## 2021-03-18 DIAGNOSIS — D509 Iron deficiency anemia, unspecified: Secondary | ICD-10-CM | POA: Diagnosis not present

## 2021-03-18 DIAGNOSIS — N2581 Secondary hyperparathyroidism of renal origin: Secondary | ICD-10-CM | POA: Diagnosis not present

## 2021-03-20 DIAGNOSIS — D689 Coagulation defect, unspecified: Secondary | ICD-10-CM | POA: Diagnosis not present

## 2021-03-20 DIAGNOSIS — D509 Iron deficiency anemia, unspecified: Secondary | ICD-10-CM | POA: Diagnosis not present

## 2021-03-20 DIAGNOSIS — Z992 Dependence on renal dialysis: Secondary | ICD-10-CM | POA: Diagnosis not present

## 2021-03-20 DIAGNOSIS — N186 End stage renal disease: Secondary | ICD-10-CM | POA: Diagnosis not present

## 2021-03-20 DIAGNOSIS — N2581 Secondary hyperparathyroidism of renal origin: Secondary | ICD-10-CM | POA: Diagnosis not present

## 2021-03-24 ENCOUNTER — Other Ambulatory Visit: Payer: Self-pay

## 2021-03-24 ENCOUNTER — Ambulatory Visit (INDEPENDENT_AMBULATORY_CARE_PROVIDER_SITE_OTHER): Payer: Medicare Other

## 2021-03-24 DIAGNOSIS — Z5181 Encounter for therapeutic drug level monitoring: Secondary | ICD-10-CM

## 2021-03-24 DIAGNOSIS — I4891 Unspecified atrial fibrillation: Secondary | ICD-10-CM | POA: Diagnosis not present

## 2021-03-24 DIAGNOSIS — Z7901 Long term (current) use of anticoagulants: Secondary | ICD-10-CM

## 2021-03-24 LAB — POCT INR: INR: 1.4 — AB (ref 2.0–3.0)

## 2021-03-24 MED ORDER — WARFARIN SODIUM 2 MG PO TABS: ORAL_TABLET | ORAL | 0 refills | Status: DC

## 2021-03-24 NOTE — Patient Instructions (Signed)
Description   BILL [id number]. Today and tomorrow take 3mg  (1.5 tablets) then continue taking warfarin 1 tablet (2mg s) daily. Recheck INR in 1 week. Coumadin Clinic [phone].

## 2021-03-25 DIAGNOSIS — D509 Iron deficiency anemia, unspecified: Secondary | ICD-10-CM | POA: Diagnosis not present

## 2021-03-25 DIAGNOSIS — N186 End stage renal disease: Secondary | ICD-10-CM | POA: Diagnosis not present

## 2021-03-25 DIAGNOSIS — Z992 Dependence on renal dialysis: Secondary | ICD-10-CM | POA: Diagnosis not present

## 2021-03-25 DIAGNOSIS — D689 Coagulation defect, unspecified: Secondary | ICD-10-CM | POA: Diagnosis not present

## 2021-03-25 DIAGNOSIS — N2581 Secondary hyperparathyroidism of renal origin: Secondary | ICD-10-CM | POA: Diagnosis not present

## 2021-03-27 DIAGNOSIS — D509 Iron deficiency anemia, unspecified: Secondary | ICD-10-CM | POA: Diagnosis not present

## 2021-03-27 DIAGNOSIS — N2581 Secondary hyperparathyroidism of renal origin: Secondary | ICD-10-CM | POA: Diagnosis not present

## 2021-03-27 DIAGNOSIS — N186 End stage renal disease: Secondary | ICD-10-CM | POA: Diagnosis not present

## 2021-03-27 DIAGNOSIS — D689 Coagulation defect, unspecified: Secondary | ICD-10-CM | POA: Diagnosis not present

## 2021-03-27 DIAGNOSIS — Z992 Dependence on renal dialysis: Secondary | ICD-10-CM | POA: Diagnosis not present

## 2021-04-01 DIAGNOSIS — Z992 Dependence on renal dialysis: Secondary | ICD-10-CM | POA: Diagnosis not present

## 2021-04-01 DIAGNOSIS — D689 Coagulation defect, unspecified: Secondary | ICD-10-CM | POA: Diagnosis not present

## 2021-04-01 DIAGNOSIS — N186 End stage renal disease: Secondary | ICD-10-CM | POA: Diagnosis not present

## 2021-04-01 DIAGNOSIS — D509 Iron deficiency anemia, unspecified: Secondary | ICD-10-CM | POA: Diagnosis not present

## 2021-04-01 DIAGNOSIS — N2581 Secondary hyperparathyroidism of renal origin: Secondary | ICD-10-CM | POA: Diagnosis not present

## 2021-04-02 ENCOUNTER — Telehealth: Payer: Self-pay

## 2021-04-02 NOTE — Telephone Encounter (Signed)
Pt missed appt for INR check today. Lmom asking him to call back to r/s.

## 2021-04-03 DIAGNOSIS — N186 End stage renal disease: Secondary | ICD-10-CM | POA: Diagnosis not present

## 2021-04-03 DIAGNOSIS — Z992 Dependence on renal dialysis: Secondary | ICD-10-CM | POA: Diagnosis not present

## 2021-04-03 DIAGNOSIS — D509 Iron deficiency anemia, unspecified: Secondary | ICD-10-CM | POA: Diagnosis not present

## 2021-04-03 DIAGNOSIS — N2581 Secondary hyperparathyroidism of renal origin: Secondary | ICD-10-CM | POA: Diagnosis not present

## 2021-04-03 DIAGNOSIS — D689 Coagulation defect, unspecified: Secondary | ICD-10-CM | POA: Diagnosis not present

## 2021-04-08 DIAGNOSIS — Z992 Dependence on renal dialysis: Secondary | ICD-10-CM | POA: Diagnosis not present

## 2021-04-08 DIAGNOSIS — N2581 Secondary hyperparathyroidism of renal origin: Secondary | ICD-10-CM | POA: Diagnosis not present

## 2021-04-08 DIAGNOSIS — D631 Anemia in chronic kidney disease: Secondary | ICD-10-CM | POA: Diagnosis not present

## 2021-04-08 DIAGNOSIS — D509 Iron deficiency anemia, unspecified: Secondary | ICD-10-CM | POA: Diagnosis not present

## 2021-04-08 DIAGNOSIS — N186 End stage renal disease: Secondary | ICD-10-CM | POA: Diagnosis not present

## 2021-04-08 DIAGNOSIS — I482 Chronic atrial fibrillation, unspecified: Secondary | ICD-10-CM | POA: Diagnosis not present

## 2021-04-10 DIAGNOSIS — Z992 Dependence on renal dialysis: Secondary | ICD-10-CM | POA: Diagnosis not present

## 2021-04-10 DIAGNOSIS — N186 End stage renal disease: Secondary | ICD-10-CM | POA: Diagnosis not present

## 2021-04-10 DIAGNOSIS — I482 Chronic atrial fibrillation, unspecified: Secondary | ICD-10-CM | POA: Diagnosis not present

## 2021-04-10 DIAGNOSIS — D509 Iron deficiency anemia, unspecified: Secondary | ICD-10-CM | POA: Diagnosis not present

## 2021-04-10 DIAGNOSIS — D631 Anemia in chronic kidney disease: Secondary | ICD-10-CM | POA: Diagnosis not present

## 2021-04-10 DIAGNOSIS — N2581 Secondary hyperparathyroidism of renal origin: Secondary | ICD-10-CM | POA: Diagnosis not present

## 2021-04-15 DIAGNOSIS — I482 Chronic atrial fibrillation, unspecified: Secondary | ICD-10-CM | POA: Diagnosis not present

## 2021-04-15 DIAGNOSIS — D509 Iron deficiency anemia, unspecified: Secondary | ICD-10-CM | POA: Diagnosis not present

## 2021-04-15 DIAGNOSIS — Z992 Dependence on renal dialysis: Secondary | ICD-10-CM | POA: Diagnosis not present

## 2021-04-15 DIAGNOSIS — N186 End stage renal disease: Secondary | ICD-10-CM | POA: Diagnosis not present

## 2021-04-15 DIAGNOSIS — D631 Anemia in chronic kidney disease: Secondary | ICD-10-CM | POA: Diagnosis not present

## 2021-04-15 DIAGNOSIS — N2581 Secondary hyperparathyroidism of renal origin: Secondary | ICD-10-CM | POA: Diagnosis not present

## 2021-04-17 DIAGNOSIS — N186 End stage renal disease: Secondary | ICD-10-CM | POA: Diagnosis not present

## 2021-04-17 DIAGNOSIS — Z992 Dependence on renal dialysis: Secondary | ICD-10-CM | POA: Diagnosis not present

## 2021-04-17 DIAGNOSIS — D631 Anemia in chronic kidney disease: Secondary | ICD-10-CM | POA: Diagnosis not present

## 2021-04-17 DIAGNOSIS — I482 Chronic atrial fibrillation, unspecified: Secondary | ICD-10-CM | POA: Diagnosis not present

## 2021-04-17 DIAGNOSIS — D509 Iron deficiency anemia, unspecified: Secondary | ICD-10-CM | POA: Diagnosis not present

## 2021-04-17 DIAGNOSIS — N2581 Secondary hyperparathyroidism of renal origin: Secondary | ICD-10-CM | POA: Diagnosis not present

## 2021-04-21 ENCOUNTER — Other Ambulatory Visit: Payer: Self-pay

## 2021-04-21 ENCOUNTER — Encounter: Payer: Self-pay | Admitting: Cardiology

## 2021-04-21 ENCOUNTER — Ambulatory Visit (INDEPENDENT_AMBULATORY_CARE_PROVIDER_SITE_OTHER): Payer: Medicare Other | Admitting: Cardiology

## 2021-04-21 ENCOUNTER — Ambulatory Visit (INDEPENDENT_AMBULATORY_CARE_PROVIDER_SITE_OTHER): Payer: Medicare Other | Admitting: *Deleted

## 2021-04-21 VITALS — BP 100/70 | HR 70 | Ht 74.0 in | Wt 165.0 lb

## 2021-04-21 DIAGNOSIS — Z5181 Encounter for therapeutic drug level monitoring: Secondary | ICD-10-CM

## 2021-04-21 DIAGNOSIS — Z992 Dependence on renal dialysis: Secondary | ICD-10-CM | POA: Diagnosis not present

## 2021-04-21 DIAGNOSIS — N186 End stage renal disease: Secondary | ICD-10-CM

## 2021-04-21 DIAGNOSIS — I482 Chronic atrial fibrillation, unspecified: Secondary | ICD-10-CM | POA: Diagnosis not present

## 2021-04-21 DIAGNOSIS — I716 Thoracoabdominal aortic aneurysm, without rupture, unspecified: Secondary | ICD-10-CM

## 2021-04-21 DIAGNOSIS — I4891 Unspecified atrial fibrillation: Secondary | ICD-10-CM

## 2021-04-21 DIAGNOSIS — I714 Abdominal aortic aneurysm, without rupture, unspecified: Secondary | ICD-10-CM

## 2021-04-21 DIAGNOSIS — Z7901 Long term (current) use of anticoagulants: Secondary | ICD-10-CM

## 2021-04-21 LAB — POCT INR: INR: 2.5 (ref 2.0–3.0)

## 2021-04-21 NOTE — Assessment & Plan Note (Signed)
Flutter pattern/coarse atrial fibrillation noted on ECG.  Continue with current medical management amiodarone and metoprolol.  High risk medication.  We will check lab work today including TSH complete metabolic profile CBC.  Well rate controlled.

## 2021-04-21 NOTE — Patient Instructions (Signed)
Medication Instructions:  Your physician recommends that you continue on your current medications as directed. Please refer to the Current Medication list given to you today.  *If you need a refill on your cardiac medications before your next appointment, please call your pharmacy*   Lab Work: TODAY: CBC, CMET, TSH If you have labs (blood work) drawn today and your tests are completely normal, you will receive your results only by: [place] (if you have MyChart) OR A paper copy in the mail If you have any lab test that is abnormal or we need to change your treatment, we will call you to review the results.   Testing/Procedures: NONE   Follow-Up: At [facility], you and your health needs are our priority.  As part of our continuing mission to provide you with exceptional heart care, we have created designated Provider Care Teams.  These Care Teams include your primary Cardiologist (physician) and Advanced Practice Providers (APPs -  Physician Assistants and Nurse Practitioners) who all work together to provide you with the care you need, when you need it.   Your next appointment:   6 month(s)  The format for your next appointment:   In Person  Provider:   Candee Furbish, MD

## 2021-04-21 NOTE — Progress Notes (Signed)
Cardiology Office Note:    Date:  04/21/2021   ID:  Corey [name], DOB Feb 19, 1971, MRN [id number]  PCP:  Kerin Perna, NP   [facility] Providers Cardiologist:  Candee Furbish, MD     Referring MD: Kerin Perna, NP    History of Present Illness:    Corey [name] is a 51 y.o. male here for follow-up of chronic type B aortic dissection, atrial fibrillation, chronic paraplegia related to aortic dissection and subsequent repair in 2004 [place] here for follow-up.  Prior CT showed maximum thoracic aortic dimension of 5.2 cm in the thoracic region.  Previously was 5.  This is being monitored by thoracic surgery as well.  There was a up to 3 cm new small ulcerated plaque saccular aneurysm in the abdominal region as well.  Both Dr. Unk Lightning as well as Dr. Roxan Hockey aware.  End-stage renal disease on hemodialysis.  Blindness.  Overall feels well, in wheelchair.  No significant change from prior visit.  No chest pain no shortness of breath    Past Medical History:  Diagnosis Date   Arthritis    hands and shoulders   Blindness and low vision    "Stargardt disease"   Dissection of aorta ([place]) 2004   a. s/p extensive repeair in 0737 in [place] complicated by ESRD, lower extremity paralysis, coma, and extended hospitalization of 2 years   ESRD (end stage renal disease) ([place])    a. TTS   Headache    History of cardioversion 2014   Hypertension    Neuropathy    Non-healing non-surgical wound 03/2016   PAF (paroxysmal atrial fibrillation) ([place])    a. s/p DCCV in 2014; b. on Coumadin; c. CHADS2VASc => 2 (HTN, vascular disease)   Paralysis ([place])    due to dissection of aorta in 2004, lower extremities   Pneumonia     Past Surgical History:  Procedure Laterality Date   APPLICATION OF WOUND VAC Left 04/13/2016   Procedure: APPLICATION OF WOUND VAC;  Surgeon: Conrad [place], MD;  Location: [place];  Service: Vascular;  Laterality: Left;   APPLICATION OF WOUND VAC Left 04/18/2016    Procedure: APPLICATION OF WOUND VAC;  Surgeon: Waynetta Sandy, MD;  Location: [place];  Service: Vascular;  Laterality: Left;  Wound vac change    APPLICATION OF WOUND VAC Left 04/20/2016   Procedure: WOUND VAC CHANGE;  Surgeon: Conrad [place], MD;  Location: [place];  Service: Vascular;  Laterality: Left;   AV FISTULA PLACEMENT     [place] Left 12/09/2015   Procedure: FIRST STAGE BASILIC VEIN TRANSPOSITION LEFT UPPER ARM;  Surgeon: Conrad [place], MD;  Location: [place];  Service: Vascular;  Laterality: Left;   [place] Left 03/09/2016   Procedure: SECOND STAGE BASILIC VEIN TRANSPOSITION WITH REVISION OF ANASTOMOSIS LEFT UPPER ARM;  Surgeon: Conrad [place], MD;  Location: [place];  Service: Vascular;  Laterality: Left;   CARDIOVERSION     CARDIOVERSION N/A 01/04/2019   Procedure: CARDIOVERSION;  Surgeon: Lelon Perla, MD;  [facility];  Service: Cardiovascular;  Laterality: N/A;   REPAIR OF ACUTE ASCENDING THORACIC AORTIC DISSECTION     REVISON OF ARTERIOVENOUS FISTULA Left 04/20/2016   Procedure: LIGATION OF BASILIC VEIN TRANSPOSITION;  Surgeon: Conrad [place], MD;  Location: [place];  Service: Vascular;  Laterality: Left;   WOUND DEBRIDEMENT Left 04/13/2016   Procedure: DEBRIDEMENT WOUND;  Surgeon: Conrad Bartlett, MD;  [facility];  Service: Vascular;  Laterality: Left;    Current Medications: Current Meds  Medication Sig   amiodarone (PACERONE) 200 MG tablet Take 1 tablet (200 mg total) by mouth daily.   cinacalcet (SENSIPAR) 60 MG tablet Take 120 mg by mouth every evening.   docusate sodium (COLACE) 100 MG capsule Take 1 capsule (100 mg total) by mouth every 12 (twelve) hours.   metoprolol tartrate (LOPRESSOR) 25 MG tablet Take 0.5 tablets (12.5 mg total) by mouth 2 (two) times daily. Hold on the AM of dialysis days   Multiple Vitamins-Minerals (MULTIVITAMIN WITH MINERALS) tablet Take 1 tablet by mouth daily.   oxyCODONE-acetaminophen  (PERCOCET) 5-325 MG tablet Take 1 tablet by mouth every 4 (four) hours as needed.   RENVELA 2.4 g PACK Take 4.8 g by mouth in the morning, at noon, and at bedtime.   warfarin (COUMADIN) 2 MG tablet Take 1 tablet by mouth daily or as directed by Anticoagulation Clinic.     Allergies:   Ciprofloxacin, Heparin, and Quinolones   Social History   Socioeconomic History   Marital status: Divorced    Spouse name: Not on file   Number of children: 2   Years of education: Not on file   Highest education level: Not on file  Occupational History   Occupation: DIABLED  Tobacco Use   Smoking status: Never   Smokeless tobacco: Never  Vaping Use   Vaping Use: Never used  Substance and Sexual Activity   Alcohol use: No   Drug use: No   Sexual activity: Not on file  Other Topics Concern   Not on file  Social History Narrative   Not on file   Social Determinants of Health   Financial Resource Strain: Not on file  Food Insecurity: Not on file  Transportation Needs: Not on file  Physical Activity: Not on file  Stress: Not on file  Social Connections: Not on file     Family History: The patient's family history includes Cancer in his father and mother; Hypertension in his father and mother; Stroke in his brother; Thyroid disease in his sister.  ROS:   Please see the history of present illness.     All other systems reviewed and are negative.  EKGs/Labs/Other Studies Reviewed:    The following studies were reviewed today: CT reviewed as above, prior echo 2020 showed normal EF moderate pulmonary hypertension.  12/2020: 1. Unchanged contour and caliber of Stanford type B thoracic aortic dissection which extends into the abdomen ending at the level of the renal arteries. Maximum thoracic arch dimension 5.2 cm (previously 5.0 cm). Recommend semi-annual imaging followup by CTA or MRA and referral to cardiothoracic surgery if not already obtained. This recommendation follows  2010 ACCF/AHA/AATS/ACR/ASA/SCA/SCAI/SIR/STS/SVM Guidelines for the Diagnosis and Management of Patients With Thoracic Aortic Disease. Circulation. 2010; 121: C588-F02. Aortic aneurysm NOS (ICD10-I71.9) 2. New mild aneurysmal dilatation of the abdominal aorta measuring up to 3 cm with new small ulcerated plaque/saccular aneurysm. 3. Stable severe focal stenosis origin of the inferior mesenteric artery. Inferior mesenteric artery is otherwise patent. 4. Bilateral lower lobe ground-glass opacities with areas of air trapping. Findings can be seen with small airways disease. 5. Questionable wall thickening of the stomach. Correlate for gastritis. 6. Small amount of ascites. 7. Mild diffuse pancreatic ductal dilatation. A follow-up pancreatic MRI is recommended non emergently. 8. Skin thickening and irregularity of the left gluteal fold. Findings may be related to infection. Please correlate clinically. 9. Aortic Atherosclerosis (ICD10-I70.0). Aortic  aneurysm NOS  Prior EKG reviewed showing rate controlled atrial flutter  Recent Labs: 12/10/2020: ALT 8; BUN 57; Creatinine, Ser 12.22; Hemoglobin 11.1; Platelets 146; Potassium 4.5; Sodium 136  Recent Lipid Panel    Component Value Date/Time   CHOL 150 05/20/2018 0243   TRIG 148 05/20/2018 0243   HDL 27 (L) 05/20/2018 0243   CHOLHDL 5.6 05/20/2018 0243   VLDL 30 05/20/2018 0243   LDLCALC 93 05/20/2018 0243     Risk Assessment/Calculations:              Physical Exam:    VS:  BP 100/70 (BP Location: Left Arm, Patient Position: Sitting, Cuff Size: Normal)    Pulse 70    Ht 6\' 2"  (1.88 m)    Wt 165 lb (74.8 kg)    SpO2 95%    BMI 21.18 kg/m     Wt Readings from Last 3 Encounters:  04/21/21 165 lb (74.8 kg)  12/18/20 180 lb (81.6 kg)  12/10/20 185 lb (83.9 kg)     GEN:  Well nourished, well developed in no acute distress, in wheelchair HEENT: Normal NECK: No JVD; No carotid bruits LYMPHATICS: No lymphadenopathy CARDIAC:  Slightly irregular, no murmurs, no rubs, gallops RESPIRATORY:  Clear to auscultation without rales, wheezing or rhonchi  ABDOMEN: Soft, non-tender, non-distended MUSCULOSKELETAL:  No edema; No deformity  SKIN: Warm and dry NEUROLOGIC: Lower extremity paraplegia noted PSYCHIATRIC:  Normal affect   ASSESSMENT:    1. Encounter for therapeutic drug monitoring   2. Atrial fibrillation, chronic ([place])   3. Thoracoabdominal aortic aneurysm (TAAA) without rupture, unspecified part   4. Abdominal aortic aneurysm (AAA) without rupture, unspecified part   5. ESRD on dialysis [facility])    PLAN:    In order of problems listed above:  Atrial fibrillation, chronic (HCC) Flutter pattern/coarse atrial fibrillation noted on ECG.  Continue with current medical management amiodarone and metoprolol.  High risk medication.  We will check lab work today including TSH complete metabolic profile CBC.  Well rate controlled.  Thoracoabdominal aortic aneurysm (TAAA) without rupture [facility]) Reviewed note from Dr. Roxan Hockey as well as Dr. Unk Lightning of vascular and cardiothoracic surgery.  After next visit we will go ahead and repeat CT scans to monitor.  Discussed with him that surgical repair would be extremely high risk given his comorbidities and prior surgical repair.  AAA (abdominal aortic aneurysm) without rupture [facility]) Reviewed note from Dr. Unk Lightning.  Small 3 cm ulcerated plaque saccular aneurysm.  We will continue with surveillance.  This seems stable.  No emergent needs.  ESRD on dialysis [facility]) On dialysis, continue to monitor.      Medication Adjustments/Labs and Tests Ordered: Current medicines are reviewed at length with the patient today.  Concerns regarding medicines are outlined above.  Orders Placed This Encounter  Procedures   TSH   Comprehensive metabolic panel   CBC   No orders of the defined types were placed in this encounter.   Patient Instructions  Medication Instructions:  Your  physician recommends that you continue on your current medications as directed. Please refer to the Current Medication list given to you today.  *If you need a refill on your cardiac medications before your next appointment, please call your pharmacy*   Lab Work: TODAY: CBC, CMET, TSH If you have labs (blood work) drawn today and your tests are completely normal, you will receive your results only by: [place] (if you have MyChart) OR A paper copy in the mail  If you have any lab test that is abnormal or we need to change your treatment, we will call you to review the results.   Testing/Procedures: NONE   Follow-Up: At [facility], you and your health needs are our priority.  As part of our continuing mission to provide you with exceptional heart care, we have created designated Provider Care Teams.  These Care Teams include your primary Cardiologist (physician) and Advanced Practice Providers (APPs -  Physician Assistants and Nurse Practitioners) who all work together to provide you with the care you need, when you need it.   Your next appointment:   6 month(s)  The format for your next appointment:   In Person  Provider:   Candee Furbish, MD      Signed, Candee Furbish, MD  04/21/2021 1:52 PM    [place]

## 2021-04-21 NOTE — Patient Instructions (Signed)
Description   BILL [id number]. Continue taking warfarin 1 tablet (2mg s) daily. Recheck INR in 2 weeks. Coumadin Clinic [phone].

## 2021-04-21 NOTE — Assessment & Plan Note (Signed)
On dialysis, continue to monitor.

## 2021-04-21 NOTE — Assessment & Plan Note (Signed)
Reviewed note from Dr. Unk Lightning.  Small 3 cm ulcerated plaque saccular aneurysm.  We will continue with surveillance.  This seems stable.  No emergent needs.

## 2021-04-21 NOTE — Assessment & Plan Note (Signed)
Reviewed note from Dr. Roxan Hockey as well as Dr. Unk Lightning of vascular and cardiothoracic surgery.  After next visit we will go ahead and repeat CT scans to monitor.  Discussed with him that surgical repair would be extremely high risk given his comorbidities and prior surgical repair.

## 2021-04-22 DIAGNOSIS — Z992 Dependence on renal dialysis: Secondary | ICD-10-CM | POA: Diagnosis not present

## 2021-04-22 DIAGNOSIS — D631 Anemia in chronic kidney disease: Secondary | ICD-10-CM | POA: Diagnosis not present

## 2021-04-22 DIAGNOSIS — D509 Iron deficiency anemia, unspecified: Secondary | ICD-10-CM | POA: Diagnosis not present

## 2021-04-22 DIAGNOSIS — N186 End stage renal disease: Secondary | ICD-10-CM | POA: Diagnosis not present

## 2021-04-22 DIAGNOSIS — I482 Chronic atrial fibrillation, unspecified: Secondary | ICD-10-CM | POA: Diagnosis not present

## 2021-04-22 DIAGNOSIS — N2581 Secondary hyperparathyroidism of renal origin: Secondary | ICD-10-CM | POA: Diagnosis not present

## 2021-04-22 LAB — COMPREHENSIVE METABOLIC PANEL
ALT: 3 IU/L (ref 0–44)
AST: 5 IU/L (ref 0–40)
Albumin/Globulin Ratio: 1.5 (ref 1.2–2.2)
Albumin: 4 g/dL (ref 4.0–5.0)
Alkaline Phosphatase: 462 IU/L — ABNORMAL HIGH (ref 44–121)
BUN/Creatinine Ratio: 4 — ABNORMAL LOW (ref 9–20)
BUN: 43 mg/dL — ABNORMAL HIGH (ref 6–24)
Bilirubin Total: 0.6 mg/dL (ref 0.0–1.2)
CO2: 26 mmol/L (ref 20–29)
Calcium: 8.1 mg/dL — ABNORMAL LOW (ref 8.7–10.2)
Chloride: 98 mmol/L (ref 96–106)
Creatinine, Ser: 11.28 mg/dL — ABNORMAL HIGH (ref 0.76–1.27)
Globulin, Total: 2.7 g/dL (ref 1.5–4.5)
Glucose: 85 mg/dL (ref 70–99)
Potassium: 4.7 mmol/L (ref 3.5–5.2)
Sodium: 143 mmol/L (ref 134–144)
Total Protein: 6.7 g/dL (ref 6.0–8.5)
eGFR: 5 mL/min/{1.73_m2} — ABNORMAL LOW (ref >59)

## 2021-04-22 LAB — CBC
Hematocrit: 34.5 % — ABNORMAL LOW (ref 37.5–51.0)
Hemoglobin: 11 g/dL — ABNORMAL LOW (ref 13.0–17.7)
MCH: 28.9 pg (ref 26.6–33.0)
MCHC: 31.9 g/dL (ref 31.5–35.7)
MCV: 91 fL (ref 79–97)
Platelets: 150 10*3/uL (ref 150–450)
RBC: 3.81 x10E6/uL — ABNORMAL LOW (ref 4.14–5.80)
RDW: 15.3 % (ref 11.6–15.4)
WBC: 6.7 10*3/uL (ref 3.4–10.8)

## 2021-04-22 LAB — TSH: TSH: 2.14 u[IU]/mL (ref 0.450–4.500)

## 2021-04-23 ENCOUNTER — Telehealth: Payer: Self-pay | Admitting: *Deleted

## 2021-04-23 NOTE — Telephone Encounter (Signed)
Left message for pt - results are on MyChart for review - needs referral to GI.

## 2021-04-23 NOTE — Telephone Encounter (Signed)
Creatinine elevated-on hemodialysis.  Hemoglobin 11, stable TSH 2.1 stable.  ALT normal.  Alk phos remains elevated 462.  Lets refer to GI for further evaluation.  His pancreatic duct was previously dilated on CT scan in 2022.   Otherwise, amiodarone labs are unremarkable.   Candee Furbish, MD

## 2021-04-24 DIAGNOSIS — D509 Iron deficiency anemia, unspecified: Secondary | ICD-10-CM | POA: Diagnosis not present

## 2021-04-24 DIAGNOSIS — N2581 Secondary hyperparathyroidism of renal origin: Secondary | ICD-10-CM | POA: Diagnosis not present

## 2021-04-24 DIAGNOSIS — I482 Chronic atrial fibrillation, unspecified: Secondary | ICD-10-CM | POA: Diagnosis not present

## 2021-04-24 DIAGNOSIS — D631 Anemia in chronic kidney disease: Secondary | ICD-10-CM | POA: Diagnosis not present

## 2021-04-24 DIAGNOSIS — Z992 Dependence on renal dialysis: Secondary | ICD-10-CM | POA: Diagnosis not present

## 2021-04-24 DIAGNOSIS — N186 End stage renal disease: Secondary | ICD-10-CM | POA: Diagnosis not present

## 2021-04-27 NOTE — Telephone Encounter (Signed)
Left message for pt results are available on MyChart for review.  Requested he call back to discuss any previous GI MDs he may have seen in the past.

## 2021-04-28 ENCOUNTER — Encounter: Payer: Self-pay | Admitting: *Deleted

## 2021-04-28 NOTE — Telephone Encounter (Signed)
Letter of results and Dr [name]' recommendation/order mailed to pt's home address.  Requested he call back to review and discuss.

## 2021-04-29 DIAGNOSIS — I482 Chronic atrial fibrillation, unspecified: Secondary | ICD-10-CM | POA: Diagnosis not present

## 2021-04-29 DIAGNOSIS — D631 Anemia in chronic kidney disease: Secondary | ICD-10-CM | POA: Diagnosis not present

## 2021-04-29 DIAGNOSIS — N186 End stage renal disease: Secondary | ICD-10-CM | POA: Diagnosis not present

## 2021-04-29 DIAGNOSIS — Z992 Dependence on renal dialysis: Secondary | ICD-10-CM | POA: Diagnosis not present

## 2021-04-29 DIAGNOSIS — D509 Iron deficiency anemia, unspecified: Secondary | ICD-10-CM | POA: Diagnosis not present

## 2021-04-29 DIAGNOSIS — N2581 Secondary hyperparathyroidism of renal origin: Secondary | ICD-10-CM | POA: Diagnosis not present

## 2021-05-01 DIAGNOSIS — I482 Chronic atrial fibrillation, unspecified: Secondary | ICD-10-CM | POA: Diagnosis not present

## 2021-05-01 DIAGNOSIS — D509 Iron deficiency anemia, unspecified: Secondary | ICD-10-CM | POA: Diagnosis not present

## 2021-05-01 DIAGNOSIS — Z992 Dependence on renal dialysis: Secondary | ICD-10-CM | POA: Diagnosis not present

## 2021-05-01 DIAGNOSIS — D631 Anemia in chronic kidney disease: Secondary | ICD-10-CM | POA: Diagnosis not present

## 2021-05-01 DIAGNOSIS — N2581 Secondary hyperparathyroidism of renal origin: Secondary | ICD-10-CM | POA: Diagnosis not present

## 2021-05-01 DIAGNOSIS — N186 End stage renal disease: Secondary | ICD-10-CM | POA: Diagnosis not present

## 2021-05-06 DIAGNOSIS — N186 End stage renal disease: Secondary | ICD-10-CM | POA: Diagnosis not present

## 2021-05-06 DIAGNOSIS — D631 Anemia in chronic kidney disease: Secondary | ICD-10-CM | POA: Diagnosis not present

## 2021-05-06 DIAGNOSIS — D509 Iron deficiency anemia, unspecified: Secondary | ICD-10-CM | POA: Diagnosis not present

## 2021-05-06 DIAGNOSIS — N2581 Secondary hyperparathyroidism of renal origin: Secondary | ICD-10-CM | POA: Diagnosis not present

## 2021-05-06 DIAGNOSIS — T8249XD Other complication of vascular dialysis catheter, subsequent encounter: Secondary | ICD-10-CM | POA: Diagnosis not present

## 2021-05-06 DIAGNOSIS — Z992 Dependence on renal dialysis: Secondary | ICD-10-CM | POA: Diagnosis not present

## 2021-05-06 DIAGNOSIS — I482 Chronic atrial fibrillation, unspecified: Secondary | ICD-10-CM | POA: Diagnosis not present

## 2021-05-08 DIAGNOSIS — Z992 Dependence on renal dialysis: Secondary | ICD-10-CM | POA: Diagnosis not present

## 2021-05-08 DIAGNOSIS — N186 End stage renal disease: Secondary | ICD-10-CM | POA: Diagnosis not present

## 2021-05-08 DIAGNOSIS — I482 Chronic atrial fibrillation, unspecified: Secondary | ICD-10-CM | POA: Diagnosis not present

## 2021-05-08 DIAGNOSIS — N2581 Secondary hyperparathyroidism of renal origin: Secondary | ICD-10-CM | POA: Diagnosis not present

## 2021-05-08 DIAGNOSIS — D631 Anemia in chronic kidney disease: Secondary | ICD-10-CM | POA: Diagnosis not present

## 2021-05-08 DIAGNOSIS — D509 Iron deficiency anemia, unspecified: Secondary | ICD-10-CM | POA: Diagnosis not present

## 2021-05-08 DIAGNOSIS — T8249XD Other complication of vascular dialysis catheter, subsequent encounter: Secondary | ICD-10-CM | POA: Diagnosis not present

## 2021-05-10 NOTE — Telephone Encounter (Signed)
Pt has not returned calls left for him and has not responded to letter mailed to his home address.  He no showed his last appt here with the [facility].  Will close this encounter for now and await further contact from pt.

## 2021-05-13 DIAGNOSIS — I482 Chronic atrial fibrillation, unspecified: Secondary | ICD-10-CM | POA: Diagnosis not present

## 2021-05-13 DIAGNOSIS — D509 Iron deficiency anemia, unspecified: Secondary | ICD-10-CM | POA: Diagnosis not present

## 2021-05-13 DIAGNOSIS — N186 End stage renal disease: Secondary | ICD-10-CM | POA: Diagnosis not present

## 2021-05-13 DIAGNOSIS — D631 Anemia in chronic kidney disease: Secondary | ICD-10-CM | POA: Diagnosis not present

## 2021-05-13 DIAGNOSIS — T8249XD Other complication of vascular dialysis catheter, subsequent encounter: Secondary | ICD-10-CM | POA: Diagnosis not present

## 2021-05-13 DIAGNOSIS — Z992 Dependence on renal dialysis: Secondary | ICD-10-CM | POA: Diagnosis not present

## 2021-05-13 DIAGNOSIS — N2581 Secondary hyperparathyroidism of renal origin: Secondary | ICD-10-CM | POA: Diagnosis not present

## 2021-05-15 DIAGNOSIS — I482 Chronic atrial fibrillation, unspecified: Secondary | ICD-10-CM | POA: Diagnosis not present

## 2021-05-15 DIAGNOSIS — Z992 Dependence on renal dialysis: Secondary | ICD-10-CM | POA: Diagnosis not present

## 2021-05-15 DIAGNOSIS — N186 End stage renal disease: Secondary | ICD-10-CM | POA: Diagnosis not present

## 2021-05-15 DIAGNOSIS — N2581 Secondary hyperparathyroidism of renal origin: Secondary | ICD-10-CM | POA: Diagnosis not present

## 2021-05-15 DIAGNOSIS — D509 Iron deficiency anemia, unspecified: Secondary | ICD-10-CM | POA: Diagnosis not present

## 2021-05-15 DIAGNOSIS — T8249XD Other complication of vascular dialysis catheter, subsequent encounter: Secondary | ICD-10-CM | POA: Diagnosis not present

## 2021-05-15 DIAGNOSIS — D631 Anemia in chronic kidney disease: Secondary | ICD-10-CM | POA: Diagnosis not present

## 2021-05-17 ENCOUNTER — Other Ambulatory Visit: Payer: Self-pay

## 2021-05-17 ENCOUNTER — Ambulatory Visit (INDEPENDENT_AMBULATORY_CARE_PROVIDER_SITE_OTHER): Payer: Medicare Other | Admitting: *Deleted

## 2021-05-17 DIAGNOSIS — Z5181 Encounter for therapeutic drug level monitoring: Secondary | ICD-10-CM

## 2021-05-17 DIAGNOSIS — I4891 Unspecified atrial fibrillation: Secondary | ICD-10-CM

## 2021-05-17 DIAGNOSIS — Z7901 Long term (current) use of anticoagulants: Secondary | ICD-10-CM

## 2021-05-17 LAB — POCT INR: INR: 1.5 — AB (ref 2.0–3.0)

## 2021-05-17 MED ORDER — WARFARIN SODIUM 2 MG PO TABS: ORAL_TABLET | ORAL | 0 refills | Status: DC

## 2021-05-17 NOTE — Patient Instructions (Signed)
Description   Take 1.5 tablets of warfarin today and tomorrow, then continue to take 1 tablet daily. Recheck INR in 2 weeks. Coumadin Clinic [phone]

## 2021-05-20 DIAGNOSIS — Z992 Dependence on renal dialysis: Secondary | ICD-10-CM | POA: Diagnosis not present

## 2021-05-20 DIAGNOSIS — N2581 Secondary hyperparathyroidism of renal origin: Secondary | ICD-10-CM | POA: Diagnosis not present

## 2021-05-20 DIAGNOSIS — I482 Chronic atrial fibrillation, unspecified: Secondary | ICD-10-CM | POA: Diagnosis not present

## 2021-05-20 DIAGNOSIS — D631 Anemia in chronic kidney disease: Secondary | ICD-10-CM | POA: Diagnosis not present

## 2021-05-20 DIAGNOSIS — T8249XD Other complication of vascular dialysis catheter, subsequent encounter: Secondary | ICD-10-CM | POA: Diagnosis not present

## 2021-05-20 DIAGNOSIS — D509 Iron deficiency anemia, unspecified: Secondary | ICD-10-CM | POA: Diagnosis not present

## 2021-05-20 DIAGNOSIS — N186 End stage renal disease: Secondary | ICD-10-CM | POA: Diagnosis not present

## 2021-05-22 DIAGNOSIS — I482 Chronic atrial fibrillation, unspecified: Secondary | ICD-10-CM | POA: Diagnosis not present

## 2021-05-22 DIAGNOSIS — D631 Anemia in chronic kidney disease: Secondary | ICD-10-CM | POA: Diagnosis not present

## 2021-05-22 DIAGNOSIS — Z992 Dependence on renal dialysis: Secondary | ICD-10-CM | POA: Diagnosis not present

## 2021-05-22 DIAGNOSIS — N186 End stage renal disease: Secondary | ICD-10-CM | POA: Diagnosis not present

## 2021-05-22 DIAGNOSIS — D509 Iron deficiency anemia, unspecified: Secondary | ICD-10-CM | POA: Diagnosis not present

## 2021-05-22 DIAGNOSIS — N2581 Secondary hyperparathyroidism of renal origin: Secondary | ICD-10-CM | POA: Diagnosis not present

## 2021-05-22 DIAGNOSIS — T8249XD Other complication of vascular dialysis catheter, subsequent encounter: Secondary | ICD-10-CM | POA: Diagnosis not present

## 2021-05-27 DIAGNOSIS — I482 Chronic atrial fibrillation, unspecified: Secondary | ICD-10-CM | POA: Diagnosis not present

## 2021-05-27 DIAGNOSIS — D631 Anemia in chronic kidney disease: Secondary | ICD-10-CM | POA: Diagnosis not present

## 2021-05-27 DIAGNOSIS — Z992 Dependence on renal dialysis: Secondary | ICD-10-CM | POA: Diagnosis not present

## 2021-05-27 DIAGNOSIS — T8249XD Other complication of vascular dialysis catheter, subsequent encounter: Secondary | ICD-10-CM | POA: Diagnosis not present

## 2021-05-27 DIAGNOSIS — N186 End stage renal disease: Secondary | ICD-10-CM | POA: Diagnosis not present

## 2021-05-27 DIAGNOSIS — D509 Iron deficiency anemia, unspecified: Secondary | ICD-10-CM | POA: Diagnosis not present

## 2021-05-27 DIAGNOSIS — N2581 Secondary hyperparathyroidism of renal origin: Secondary | ICD-10-CM | POA: Diagnosis not present

## 2021-05-28 ENCOUNTER — Telehealth: Payer: Self-pay

## 2021-05-28 NOTE — Telephone Encounter (Signed)
Pt missed appt for INR check on 05/28/21. LMOM for him to call back to r/s.

## 2021-05-29 DIAGNOSIS — Z992 Dependence on renal dialysis: Secondary | ICD-10-CM | POA: Diagnosis not present

## 2021-05-29 DIAGNOSIS — D509 Iron deficiency anemia, unspecified: Secondary | ICD-10-CM | POA: Diagnosis not present

## 2021-05-29 DIAGNOSIS — N186 End stage renal disease: Secondary | ICD-10-CM | POA: Diagnosis not present

## 2021-05-29 DIAGNOSIS — N2581 Secondary hyperparathyroidism of renal origin: Secondary | ICD-10-CM | POA: Diagnosis not present

## 2021-05-29 DIAGNOSIS — D631 Anemia in chronic kidney disease: Secondary | ICD-10-CM | POA: Diagnosis not present

## 2021-05-29 DIAGNOSIS — T8249XD Other complication of vascular dialysis catheter, subsequent encounter: Secondary | ICD-10-CM | POA: Diagnosis not present

## 2021-05-29 DIAGNOSIS — I482 Chronic atrial fibrillation, unspecified: Secondary | ICD-10-CM | POA: Diagnosis not present

## 2021-06-04 ENCOUNTER — Other Ambulatory Visit: Payer: Self-pay

## 2021-06-04 ENCOUNTER — Ambulatory Visit (INDEPENDENT_AMBULATORY_CARE_PROVIDER_SITE_OTHER): Payer: Medicare Other | Admitting: *Deleted

## 2021-06-04 DIAGNOSIS — I4891 Unspecified atrial fibrillation: Secondary | ICD-10-CM

## 2021-06-04 DIAGNOSIS — Z5181 Encounter for therapeutic drug level monitoring: Secondary | ICD-10-CM | POA: Diagnosis not present

## 2021-06-04 LAB — POCT INR: INR: 1.3 — AB (ref 2.0–3.0)

## 2021-06-04 NOTE — Patient Instructions (Addendum)
Description   ?Take 1.5 tablets of warfarin today and tomorrow, then continue to take 1 tablet daily. Recheck INR in 1 week. Coumadin Clinic [phone] ?[place] [phone] ?  ?  ?

## 2021-07-16 ENCOUNTER — Telehealth: Payer: Self-pay | Admitting: *Deleted

## 2021-07-16 ENCOUNTER — Ambulatory Visit (INDEPENDENT_AMBULATORY_CARE_PROVIDER_SITE_OTHER): Payer: Medicare Other | Admitting: *Deleted

## 2021-07-16 DIAGNOSIS — Z5181 Encounter for therapeutic drug level monitoring: Secondary | ICD-10-CM

## 2021-07-16 DIAGNOSIS — I4891 Unspecified atrial fibrillation: Secondary | ICD-10-CM | POA: Diagnosis not present

## 2021-07-16 DIAGNOSIS — Z7901 Long term (current) use of anticoagulants: Secondary | ICD-10-CM

## 2021-07-16 LAB — POCT INR: INR: 2.9 (ref 2.0–3.0)

## 2021-07-16 MED ORDER — AMIODARONE HCL 200 MG PO TABS: 200.0000 mg | ORAL_TABLET | Freq: Every day | ORAL | 3 refills | Status: DC

## 2021-07-16 MED ORDER — WARFARIN SODIUM 2 MG PO TABS: ORAL_TABLET | ORAL | 0 refills | Status: DC

## 2021-07-16 NOTE — Telephone Encounter (Signed)
Pt's medication was sent to pt's pharmacy as requested. Confirmation received.  °

## 2021-07-16 NOTE — Patient Instructions (Signed)
Description   ?Continue to take 1 tablet daily. Recheck INR in 2 weeks. Coumadin Clinic [phone] ?[place] [id number] ?  ? ? ?

## 2021-07-16 NOTE — Telephone Encounter (Signed)
Pt stated he needs a refill of his amiodarone sent to the [facility] on [address].  ?

## 2021-08-06 ENCOUNTER — Telehealth: Payer: Self-pay | Admitting: Cardiology

## 2021-08-06 DIAGNOSIS — K8689 Other specified diseases of pancreas: Secondary | ICD-10-CM

## 2021-08-06 DIAGNOSIS — R748 Abnormal levels of other serum enzymes: Secondary | ICD-10-CM

## 2021-08-06 NOTE — Telephone Encounter (Signed)
Corey Pain, MD  ?04/22/2021 10:29 AM EST   ?  ?Creatinine elevated-on hemodialysis.  Hemoglobin 11, stable TSH 2.1 stable.  ALT normal.  Alk phos remains elevated 462.  Lets refer to GI for further evaluation.  His pancreatic duct was previously dilated on CT scan in 2022. ?  ?Otherwise, amiodarone labs are unremarkable. ?  ?Corey Furbish, MD  ? ?The above information is documentation on pt's lab work.  We were not able to contact pt by phone to review the results and orders in January.  A letter was mailed to his home address.  Pt calling today because he is just now learning of the information in the letter mailed to him.  He reports he is legally blind and can not read written correspondence.   Pt also reports he is unaware of any precious issue with his pancreas and is unaware of dilation.  Pt is asking what he needs to do at this point.  He has not had any lab work checking his liver functions since January.  Advised I will have Dr [name] to review and call him back on his cell number as requested with further directions.   ?

## 2021-08-06 NOTE — Telephone Encounter (Signed)
Patient states he received a call telling him Dr. [name] wants to send him to see a GI doctor.  He wants to know why.  ?

## 2021-08-09 NOTE — Telephone Encounter (Signed)
Jerline Pain, MD  You 5 hours ago (7:17 AM)  ? ?Let's have GI review because of abnormal Alk Phos and dilated duct seen on prior CT.  ?I am hoping this is not a worrisome finding, but I require their expertise to understand this further.  ?Thanks   ? ? ?Pt is aware order is placed to be seen by GI.  He will need an appt on M/W/F as he has dialysis on T,TH,Saturday.   ?

## 2021-08-11 ENCOUNTER — Ambulatory Visit (INDEPENDENT_AMBULATORY_CARE_PROVIDER_SITE_OTHER): Payer: Medicare Other

## 2021-08-11 DIAGNOSIS — Z5181 Encounter for therapeutic drug level monitoring: Secondary | ICD-10-CM

## 2021-08-11 DIAGNOSIS — I4891 Unspecified atrial fibrillation: Secondary | ICD-10-CM

## 2021-08-11 DIAGNOSIS — Z7901 Long term (current) use of anticoagulants: Secondary | ICD-10-CM

## 2021-08-11 LAB — POCT INR: INR: 1.5 — AB (ref 2.0–3.0)

## 2021-08-11 NOTE — Patient Instructions (Signed)
Description   ?Take 2 tablets today and then continue to take 1 tablet daily.  ?Recheck INR in 2 weeks.  ?Coumadin Clinic [phone] ?[place] [phone] ?  ?   ?

## 2021-08-27 ENCOUNTER — Ambulatory Visit (INDEPENDENT_AMBULATORY_CARE_PROVIDER_SITE_OTHER): Payer: Medicare Other | Admitting: *Deleted

## 2021-08-27 DIAGNOSIS — I4891 Unspecified atrial fibrillation: Secondary | ICD-10-CM

## 2021-08-27 DIAGNOSIS — Z7901 Long term (current) use of anticoagulants: Secondary | ICD-10-CM

## 2021-08-27 LAB — POCT INR: INR: 1.5 — AB (ref 2.0–3.0)

## 2021-08-27 NOTE — Patient Instructions (Addendum)
Description   Take 2 tablets today and then continue taking 1 tablet daily. REMEMBER TO TAKE YOUR PILLS DAILY. Recheck INR in 2 weeks. Coumadin Clinic [place]

## 2021-09-06 ENCOUNTER — Telehealth (INDEPENDENT_AMBULATORY_CARE_PROVIDER_SITE_OTHER): Payer: Self-pay | Admitting: Primary Care

## 2021-09-06 NOTE — Telephone Encounter (Signed)
Copied from [place] [phone]. Topic: Appointment Scheduling - Scheduling Inquiry for Clinic >> Sep 06, 2021 11:05 AM Scherrie Gerlach wrote: Reason for CRM: pt wants to know if you have an earlier new pt appt than 8/11?  He needs a new wheelchair and was told to call his pcp.   Pt used to see Altamease Oiler.  But it has been well over 3 years. Thanks.  (Pt added to the cancellation list)

## 2021-09-06 NOTE — Telephone Encounter (Signed)
Patient is aware that at this time office does not have any appointments sooner than what he is scheduled for. He is aware that he has been placed on the wait list in the event someone cancels. He verbalized understanding.

## 2021-09-07 ENCOUNTER — Other Ambulatory Visit: Payer: Self-pay | Admitting: Cardiothoracic Surgery

## 2021-09-07 DIAGNOSIS — I71019 Dissection of thoracic aorta, unspecified: Secondary | ICD-10-CM

## 2021-10-06 ENCOUNTER — Telehealth: Payer: Self-pay | Admitting: *Deleted

## 2021-10-06 DIAGNOSIS — I482 Chronic atrial fibrillation, unspecified: Secondary | ICD-10-CM

## 2021-10-06 NOTE — Telephone Encounter (Signed)
Pt called and made INR appointment for 10/13/2021.

## 2021-10-11 ENCOUNTER — Other Ambulatory Visit: Payer: Self-pay

## 2021-10-11 DIAGNOSIS — I4891 Unspecified atrial fibrillation: Secondary | ICD-10-CM

## 2021-10-11 DIAGNOSIS — Z7901 Long term (current) use of anticoagulants: Secondary | ICD-10-CM

## 2021-10-11 MED ORDER — WARFARIN SODIUM 2 MG PO TABS: ORAL_TABLET | ORAL | 0 refills | Status: DC

## 2021-10-11 NOTE — Telephone Encounter (Signed)
Prescription refill request received for warfarin [place]: 04/21/21 [name])  Next INR check: OVERDUE - pt has schedule appt on 10/20/21 Warfarin tablet strength: 2mg   Pt called and reschedule anticoagulation clinic appt and stated he needed Warfarin to last until upcoming appt. Refill sent to requested pharmacy.

## 2021-10-15 ENCOUNTER — Encounter: Payer: Self-pay | Admitting: Cardiology

## 2021-10-20 ENCOUNTER — Ambulatory Visit (INDEPENDENT_AMBULATORY_CARE_PROVIDER_SITE_OTHER): Payer: Medicare Other

## 2021-10-20 DIAGNOSIS — I482 Chronic atrial fibrillation, unspecified: Secondary | ICD-10-CM

## 2021-10-20 DIAGNOSIS — Z7901 Long term (current) use of anticoagulants: Secondary | ICD-10-CM

## 2021-10-20 LAB — POCT INR: INR: 1.3 — AB (ref 2.0–3.0)

## 2021-10-20 NOTE — Patient Instructions (Signed)
Description   Take 2 tablets today and 1.5 tablets tomorrow and then continue taking 1 tablet daily. REMEMBER TO TAKE YOUR PILLS DAILY.  Recheck INR in 2 weeks. Coumadin Clinic [place]

## 2021-11-01 ENCOUNTER — Other Ambulatory Visit: Payer: Medicare Other

## 2021-11-01 ENCOUNTER — Ambulatory Visit: Payer: Medicare Other | Admitting: Cardiothoracic Surgery

## 2021-11-01 ENCOUNTER — Ambulatory Visit
Admission: RE | Admit: 2021-11-01 | Discharge: 2021-11-01 | Disposition: A | Discharge status: Home / Self Care | Payer: Medicare Other | Source: Ambulatory Visit | Attending: Cardiothoracic Surgery | Admitting: Cardiothoracic Surgery

## 2021-11-01 DIAGNOSIS — I71019 Dissection of thoracic aorta, unspecified: Secondary | ICD-10-CM

## 2021-11-01 MED ORDER — IOPAMIDOL (ISOVUE-370) INJECTION 76%
75.0000 mL | Freq: Once | INTRAVENOUS | Status: AC | PRN
  Administered 2021-11-01: 75 mL via INTRAVENOUS

## 2021-11-08 ENCOUNTER — Ambulatory Visit: Payer: Medicare Other | Admitting: Cardiothoracic Surgery

## 2021-11-12 ENCOUNTER — Encounter (INDEPENDENT_AMBULATORY_CARE_PROVIDER_SITE_OTHER): Payer: Self-pay | Admitting: Primary Care

## 2021-11-12 ENCOUNTER — Ambulatory Visit (INDEPENDENT_AMBULATORY_CARE_PROVIDER_SITE_OTHER): Payer: Medicare Other | Admitting: Primary Care

## 2021-11-12 VITALS — BP 144/82 | HR 64 | Temp 98.2°F | Ht 74.0 in | Wt 159.0 lb

## 2021-11-12 DIAGNOSIS — Z7689 Persons encountering health services in other specified circumstances: Secondary | ICD-10-CM | POA: Diagnosis not present

## 2021-11-12 DIAGNOSIS — Z992 Dependence on renal dialysis: Secondary | ICD-10-CM

## 2021-11-12 DIAGNOSIS — G839 Paralytic syndrome, unspecified: Secondary | ICD-10-CM

## 2021-11-12 DIAGNOSIS — N186 End stage renal disease: Secondary | ICD-10-CM | POA: Diagnosis not present

## 2021-11-12 DIAGNOSIS — Z1211 Encounter for screening for malignant neoplasm of colon: Secondary | ICD-10-CM | POA: Diagnosis not present

## 2021-11-12 DIAGNOSIS — Z131 Encounter for screening for diabetes mellitus: Secondary | ICD-10-CM

## 2021-11-12 DIAGNOSIS — I5032 Chronic diastolic (congestive) heart failure: Secondary | ICD-10-CM

## 2021-11-12 NOTE — Progress Notes (Unsigned)
New Patient Office Visit  Subjective    Patient ID: Corey [name], male    DOB: 05-29-1970  Age: 51 y.o. MRN: [id number]  CC:  Chief Complaint  Patient presents with   New Patient (Initial Visit)    Needs new wheelchair     HPI Corey [name] presents to establish care ***  Outpatient Encounter Medications as of 11/12/2021  Medication Sig   amiodarone (PACERONE) 200 MG tablet Take 1 tablet (200 mg total) by mouth daily.   cinacalcet (SENSIPAR) 60 MG tablet Take 120 mg by mouth every evening.   docusate sodium (COLACE) 100 MG capsule Take 1 capsule (100 mg total) by mouth every 12 (twelve) hours.   metoprolol tartrate (LOPRESSOR) 25 MG tablet Take 0.5 tablets (12.5 mg total) by mouth 2 (two) times daily. Hold on the AM of dialysis days   Multiple Vitamins-Minerals (MULTIVITAMIN WITH MINERALS) tablet Take 1 tablet by mouth daily.   RENVELA 2.4 g PACK Take 4.8 g by mouth in the morning, at noon, and at bedtime.   warfarin (COUMADIN) 2 MG tablet Take 1 tablet by mouth daily or as directed by Anticoagulation Clinic.   [DISCONTINUED] oxyCODONE-acetaminophen (PERCOCET) 5-325 MG tablet Take 1 tablet by mouth every 4 (four) hours as needed.   No facility-administered encounter medications on file as of 11/12/2021.    Past Medical History:  Diagnosis Date   Arthritis    hands and shoulders   Blindness and low vision    "Stargardt disease"   Dissection of aorta ([place]) 2004   a. s/p extensive repeair in [address]01 in [place] complicated by ESRD, lower extremity paralysis, coma, and extended hospitalization of 2 years   ESRD (end stage renal disease) ([place])    a. TTS   Headache    History of cardioversion 2014   Hypertension    Neuropathy    Non-healing non-surgical wound 03/2016   PAF (paroxysmal atrial fibrillation) ([place])    a. s/p DCCV in 2014; b. on Coumadin; c. CHADS2VASc => 2 (HTN, vascular disease)   Paralysis ([place])    due to dissection of aorta in 2004, lower extremities   Pneumonia      Past Surgical History:  Procedure Laterality Date   APPLICATION OF WOUND VAC Left 04/13/2016   Procedure: APPLICATION OF WOUND VAC;  Surgeon: Conrad Geneva, MD;  Location: [facility];  Service: Vascular;  Laterality: Left;   APPLICATION OF WOUND VAC Left 04/18/2016   Procedure: APPLICATION OF WOUND VAC;  Surgeon: Waynetta Sandy, MD;  Location: [facility];  Service: Vascular;  Laterality: Left;  Wound vac change    APPLICATION OF WOUND VAC Left 04/20/2016   Procedure: WOUND VAC CHANGE;  Surgeon: Conrad Loveland, MD;  Location: [facility];  Service: Vascular;  Laterality: Left;   AV FISTULA PLACEMENT     [place] Left 12/09/2015   Procedure: FIRST STAGE BASILIC VEIN TRANSPOSITION LEFT UPPER ARM;  Surgeon: Conrad Sterlington, MD;  Location: [facility];  Service: Vascular;  Laterality: Left;   [place] Left 03/09/2016   Procedure: SECOND STAGE BASILIC VEIN TRANSPOSITION WITH REVISION OF ANASTOMOSIS LEFT UPPER ARM;  Surgeon: Conrad Brentwood, MD;  Location: [facility];  Service: Vascular;  Laterality: Left;   CARDIOVERSION     CARDIOVERSION N/A 01/04/2019   Procedure: CARDIOVERSION;  Surgeon: Lelon Perla, MD;  Location: [facility] ENDOSCOPY;  Service: Cardiovascular;  Laterality: N/A;   REPAIR OF ACUTE ASCENDING THORACIC AORTIC DISSECTION  REVISON OF ARTERIOVENOUS FISTULA Left 04/20/2016   Procedure: LIGATION OF BASILIC VEIN TRANSPOSITION;  Surgeon: Conrad Society Hill, MD;  Location: [facility];  Service: Vascular;  Laterality: Left;   WOUND DEBRIDEMENT Left 04/13/2016   Procedure: DEBRIDEMENT WOUND;  Surgeon: Conrad , MD;  Location: [facility] OR;  Service: Vascular;  Laterality: Left;    Family History  Problem Relation Age of Onset   Cancer Mother    Hypertension Mother    Cancer Father    Hypertension Father    Thyroid disease Sister    Stroke Brother        47    Social History   Socioeconomic History   Marital status: Divorced    Spouse name: Not on file   Number of children: 2    Years of education: Not on file   Highest education level: Not on file  Occupational History   Occupation: DIABLED  Tobacco Use   Smoking status: Never   Smokeless tobacco: Never  Vaping Use   Vaping Use: Never used  Substance and Sexual Activity   Alcohol use: No   Drug use: No   Sexual activity: Not on file  Other Topics Concern   Not on file  Social History Narrative   Not on file   Social Determinants of Health   Financial Resource Strain: Not on file  Food Insecurity: Not on file  Transportation Needs: Not on file  Physical Activity: Not on file  Stress: Not on file  Social Connections: Not on file  Intimate Partner Violence: Not on file    ROS      Objective    BP (!) 144/82   Pulse 64   Temp 98.2 F (36.8 C) (Oral)   Ht 6\' 2"  (1.88 m)   Wt 159 lb (72.1 kg)   SpO2 94%   BMI 20.41 kg/m   Physical Exam  {Labs (Optional):23779}    Assessment & Plan:   Problem List Items Addressed This Visit     Chronic diastolic CHF (congestive heart failure) (HCC)   ESRD on dialysis (Corey [name])   Other Visit Diagnoses     Screening for diabetes mellitus    -  Primary   Colon cancer screening       Encounter to establish care           No follow-ups on file.   Kerin Perna, NP

## 2021-11-22 ENCOUNTER — Ambulatory Visit: Payer: Medicare Other | Admitting: Cardiothoracic Surgery

## 2021-11-24 ENCOUNTER — Ambulatory Visit (INDEPENDENT_AMBULATORY_CARE_PROVIDER_SITE_OTHER): Payer: Medicare Other | Admitting: Primary Care

## 2021-11-24 DIAGNOSIS — G839 Paralytic syndrome, unspecified: Secondary | ICD-10-CM

## 2021-11-24 NOTE — Progress Notes (Signed)
[place]  Virtual Visit via Telephone Note  I connected with [name], on 11/24/2021 at 10:36 AM  by telephone and verified that I am speaking with the correct person using two identifiers.   Consent: I discussed the limitations, risks, security and privacy concerns of performing an evaluation and management service by telephone and the availability of in person appointments. I also discussed with the patient that there may be a patient responsible charge related to this service. The patient expressed understanding and agreed to proceed.   Location of Patient: home  Location of Provider: [facility]   Persons participating in Telemedicine visit: Delilah Shan,  NP Llana Aliment , CMA  History of Present Illness: Mr. JERMEY CLOSS is in poor condition and not safe to be using. He is able to self transfer, continent of bowel and bladder, paralysis of lower extremity, on dialysis end-stage renal disease and followed by cardiology atrial fib and on Coumadin prescribed by them Patient suffers from paralysis of lower extremity which impairs their ability to perform daily activities like bathing, dressing, grooming, and toileting in the home.  A cane and/or walker will not resolve issue with performing activities of daily living. A wheelchair will allow patient to safely perform daily activities. Patient can safely propel the wheelchair in the home or has a caregiver who can provide assistance.   Accessories: elevating leg rests (ELRs), wheel locks, extensions and anti-tippers. Duration of need: 99 years   Associated Visit Diagnoses: paralysis of lower extremity   Past Medical History:  Diagnosis Date   Arthritis    hands and shoulders   Blindness and low vision    "Stargardt disease"   Dissection of aorta ([place]) 2004   a. s/p extensive repeair in [address]35 in [place] complicated by ESRD, lower extremity  paralysis, coma, and extended hospitalization of 2 years   ESRD (end stage renal disease) ([place])    a. TTS   Headache    History of cardioversion 2014   Hypertension    Neuropathy    Non-healing non-surgical wound 03/2016   PAF (paroxysmal atrial fibrillation) ([place])    a. s/p DCCV in 2014; b. on Coumadin; c. CHADS2VASc => 2 (HTN, vascular disease)   Paralysis ([place])    due to dissection of aorta in 2004, lower extremities   Pneumonia    Allergies  Allergen Reactions   Ciprofloxacin Other (See Comments)    Aortic dissection   Heparin Other (See Comments)    UNSPECIFIED REACTION :  On Coumadin since 2004   HIT panel negative 01/19/17   Quinolones Other (See Comments)    unknown    Current Outpatient Medications on File Prior to Visit  Medication Sig Dispense Refill   amiodarone (PACERONE) 200 MG tablet Take 1 tablet (200 mg total) by mouth daily. 90 tablet 3   cinacalcet (SENSIPAR) 60 MG tablet Take 120 mg by mouth every evening.     docusate sodium (COLACE) 100 MG capsule Take 1 capsule (100 mg total) by mouth every 12 (twelve) hours. 60 capsule 0   metoprolol tartrate (LOPRESSOR) 25 MG tablet Take 0.5 tablets (12.5 mg total) by mouth 2 (two) times daily. Hold on the AM of dialysis days 90 tablet 3   Multiple Vitamins-Minerals (MULTIVITAMIN WITH MINERALS) tablet Take 1 tablet by mouth daily.     RENVELA 2.4 g PACK Take 4.8 g by mouth in the morning, at noon, and at  bedtime.     warfarin (COUMADIN) 2 MG tablet Take 1 tablet by mouth daily or as directed by Anticoagulation Clinic. 30 tablet 0   No current facility-administered medications on file prior to visit.    Observations/Objective: There were no vitals taken for this visit.   Assessment and Plan: Eagan was seen today for encounter for wheelchair .  Diagnoses and all orders for this visit:  Paralysis ([place]) DME wheeel chair prescribed    Follow Up Instructions:   I discussed the assessment and treatment plan with  the patient. The patient was provided an opportunity to ask questions and all were answered. The patient agreed with the plan and demonstrated an understanding of the instructions.   The patient was advised to call back or seek an in-person evaluation if the symptoms worsen or if the condition fails to improve as anticipated.     I provided 20 minutes total of non-face-to-face time during this encounter including median intraservice time, reviewing previous notes, investigations, ordering medications, medical decision making, coordinating care and patient verbalized understanding at the end of the visit.    This note has been created with Surveyor, quantity. Any transcriptional errors are unintentional.   Kerin Perna, NP 11/24/2021, 10:36 AM

## 2021-12-22 ENCOUNTER — Ambulatory Visit: Payer: Medicare Other | Attending: Cardiovascular Disease

## 2021-12-22 ENCOUNTER — Other Ambulatory Visit: Payer: Self-pay

## 2021-12-22 DIAGNOSIS — Z7901 Long term (current) use of anticoagulants: Secondary | ICD-10-CM

## 2021-12-22 DIAGNOSIS — I482 Chronic atrial fibrillation, unspecified: Secondary | ICD-10-CM | POA: Diagnosis not present

## 2021-12-22 DIAGNOSIS — I4891 Unspecified atrial fibrillation: Secondary | ICD-10-CM

## 2021-12-22 LAB — POCT INR: INR: 1.2 — AB (ref 2.0–3.0)

## 2021-12-22 MED ORDER — WARFARIN SODIUM 2 MG PO TABS: ORAL_TABLET | ORAL | 0 refills | Status: DC

## 2021-12-22 NOTE — Telephone Encounter (Signed)
Prescription refill request received for warfarin Lov: 04/21/21 Corey [name])  Next INR check: 01/05/22 Warfarin tablet strength: 2mg   Appropriate dose and refill sent to requested pharmacy.

## 2021-12-22 NOTE — Patient Instructions (Signed)
Description   Take 2 tablets today and 2 tablets tomorrow and then continue taking 1 tablet daily. REMEMBER TO TAKE YOUR PILLS DAILY.  Recheck INR in 2 weeks. Coumadin Clinic [place]

## 2022-01-05 ENCOUNTER — Telehealth: Payer: Self-pay | Admitting: *Deleted

## 2022-01-05 ENCOUNTER — Ambulatory Visit: Payer: Medicare Other | Attending: Cardiology

## 2022-01-05 NOTE — Telephone Encounter (Signed)
Called pt since he missed appt this am ; there was no answer so left him a message to call back to reschedule.

## 2022-02-18 ENCOUNTER — Other Ambulatory Visit: Payer: Self-pay

## 2022-02-18 DIAGNOSIS — I4891 Unspecified atrial fibrillation: Secondary | ICD-10-CM

## 2022-02-18 DIAGNOSIS — Z7901 Long term (current) use of anticoagulants: Secondary | ICD-10-CM

## 2022-02-18 MED ORDER — WARFARIN SODIUM 2 MG PO TABS: ORAL_TABLET | ORAL | 0 refills | Status: DC

## 2022-02-18 NOTE — Telephone Encounter (Signed)
Prescription refill request received for warfarin Lov: 04/21/21 [name])  Next INR check: 01/05/22 Warfarin tablet strength: 2mg   Appropriate dose and refill sent to requested pharmacy.

## 2022-03-02 ENCOUNTER — Ambulatory Visit: Payer: Medicare Other

## 2022-03-11 ENCOUNTER — Ambulatory Visit: Payer: Medicare Other

## 2022-03-16 ENCOUNTER — Ambulatory Visit: Payer: Medicare Other

## 2022-03-21 ENCOUNTER — Ambulatory Visit: Payer: Medicare Other

## 2022-03-25 ENCOUNTER — Ambulatory Visit: Payer: Medicare Other | Attending: Cardiology

## 2022-03-25 DIAGNOSIS — Z7901 Long term (current) use of anticoagulants: Secondary | ICD-10-CM | POA: Diagnosis not present

## 2022-03-25 DIAGNOSIS — Z5181 Encounter for therapeutic drug level monitoring: Secondary | ICD-10-CM

## 2022-03-25 DIAGNOSIS — I482 Chronic atrial fibrillation, unspecified: Secondary | ICD-10-CM | POA: Diagnosis not present

## 2022-03-25 DIAGNOSIS — I4891 Unspecified atrial fibrillation: Secondary | ICD-10-CM

## 2022-03-25 LAB — POCT INR: INR: 1.5 — AB (ref 2.0–3.0)

## 2022-03-25 MED ORDER — WARFARIN SODIUM 2 MG PO TABS: ORAL_TABLET | ORAL | 0 refills | Status: DC

## 2022-03-25 NOTE — Patient Instructions (Signed)
Take 2 tablets today and then continue taking 1 tablet daily. REMEMBER TO TAKE YOUR PILLS DAILY.  Recheck INR in 2 weeks (per pt request). Coumadin Clinic [phone]

## 2022-04-08 ENCOUNTER — Telehealth: Payer: Self-pay

## 2022-04-08 ENCOUNTER — Ambulatory Visit: Payer: Medicare Other

## 2022-04-08 NOTE — Telephone Encounter (Signed)
Lpmtcb and reschedule INR check 

## 2022-04-15 ENCOUNTER — Ambulatory Visit: Payer: 59 | Attending: Cardiology

## 2022-04-15 DIAGNOSIS — Z7901 Long term (current) use of anticoagulants: Secondary | ICD-10-CM

## 2022-04-15 DIAGNOSIS — Z5181 Encounter for therapeutic drug level monitoring: Secondary | ICD-10-CM

## 2022-04-15 DIAGNOSIS — I482 Chronic atrial fibrillation, unspecified: Secondary | ICD-10-CM

## 2022-04-15 LAB — POCT INR: INR: 1.3 — AB (ref 2.0–3.0)

## 2022-04-15 NOTE — Patient Instructions (Signed)
Take 2 tablets today and 1.5 tablets Saturday then continue taking 1 tablet daily. REMEMBER TO TAKE YOUR PILLS DAILY.  Recheck INR in 2 weeks (per pt request). Coumadin Clinic [phone]

## 2022-04-29 ENCOUNTER — Ambulatory Visit: Payer: 59

## 2022-05-02 ENCOUNTER — Ambulatory Visit: Payer: 59

## 2022-05-04 ENCOUNTER — Ambulatory Visit: Payer: 59

## 2022-05-09 ENCOUNTER — Ambulatory Visit: Payer: 59 | Attending: Cardiology

## 2022-05-09 DIAGNOSIS — Z5181 Encounter for therapeutic drug level monitoring: Secondary | ICD-10-CM | POA: Diagnosis not present

## 2022-05-09 DIAGNOSIS — Z7901 Long term (current) use of anticoagulants: Secondary | ICD-10-CM | POA: Diagnosis not present

## 2022-05-09 DIAGNOSIS — I482 Chronic atrial fibrillation, unspecified: Secondary | ICD-10-CM

## 2022-05-09 DIAGNOSIS — I4891 Unspecified atrial fibrillation: Secondary | ICD-10-CM

## 2022-05-09 LAB — POCT INR: INR: 1.2 — AB (ref 2.0–3.0)

## 2022-05-09 MED ORDER — WARFARIN SODIUM 2 MG PO TABS: ORAL_TABLET | ORAL | 0 refills | Status: DC

## 2022-05-09 NOTE — Patient Instructions (Signed)
Take 2 tablets today and 2 tablets Tuesday then continue taking 1 tablet daily. REMEMBER TO TAKE YOUR PILLS DAILY.  Recheck INR in 2 weeks (per pt request). Coumadin Clinic [phone]

## 2022-05-23 ENCOUNTER — Ambulatory Visit: Payer: 59 | Attending: Cardiovascular Disease

## 2022-06-15 ENCOUNTER — Encounter (INDEPENDENT_AMBULATORY_CARE_PROVIDER_SITE_OTHER): Payer: Self-pay

## 2022-06-29 ENCOUNTER — Other Ambulatory Visit: Payer: Self-pay

## 2022-06-29 ENCOUNTER — Ambulatory Visit: Payer: 59

## 2022-06-29 DIAGNOSIS — I4891 Unspecified atrial fibrillation: Secondary | ICD-10-CM

## 2022-06-29 DIAGNOSIS — Z7901 Long term (current) use of anticoagulants: Secondary | ICD-10-CM

## 2022-06-29 MED ORDER — WARFARIN SODIUM 2 MG PO TABS: ORAL_TABLET | ORAL | 0 refills | Status: DC

## 2022-06-29 NOTE — Telephone Encounter (Signed)
Prescription refill request received for warfarin Lov: 04/21/21 Corey [name])  Next INR check: overdue Warfarin tablet strength: 2mg   Pt has scheduled appt with Anticoagulation Clinic on 07/06/22. Refill sent to last until upcoming appt to prevent any missed doses.

## 2022-07-06 ENCOUNTER — Other Ambulatory Visit: Payer: Self-pay | Admitting: *Deleted

## 2022-07-06 ENCOUNTER — Ambulatory Visit: Payer: 59 | Attending: Internal Medicine | Admitting: *Deleted

## 2022-07-06 DIAGNOSIS — Z7901 Long term (current) use of anticoagulants: Secondary | ICD-10-CM

## 2022-07-06 DIAGNOSIS — I482 Chronic atrial fibrillation, unspecified: Secondary | ICD-10-CM

## 2022-07-06 DIAGNOSIS — I4891 Unspecified atrial fibrillation: Secondary | ICD-10-CM

## 2022-07-06 LAB — POCT INR: INR: 1.3 — AB (ref 2.0–3.0)

## 2022-07-06 MED ORDER — WARFARIN SODIUM 2 MG PO TABS: ORAL_TABLET | ORAL | 0 refills | Status: DC

## 2022-07-06 NOTE — Telephone Encounter (Signed)
Warfarin 2mg  refill Afib Last INR 07/06/22 Last OV 04/21/21, pt aware needs appt & message sent to schedulers.  Pt aware 30 day supply sent to pharmacy

## 2022-07-06 NOTE — Patient Instructions (Signed)
Description   Take 2 tablets today and 2 tablets tomorrow then continue taking 1 tablet daily. REMEMBER TO TAKE YOUR PILLS DAILY.  Recheck INR in 1 week. Coumadin Clinic [phone]

## 2022-07-13 ENCOUNTER — Ambulatory Visit: Payer: 59 | Attending: Cardiology

## 2022-07-29 ENCOUNTER — Telehealth: Payer: Self-pay | Admitting: *Deleted

## 2022-07-29 NOTE — Telephone Encounter (Signed)
Pt called to state he needs an appt for next week on the voicemail. Returned call to the pt and set appt for next Wednesday 5/1 at 130pm.

## 2022-08-03 ENCOUNTER — Ambulatory Visit: Payer: 59

## 2022-08-10 ENCOUNTER — Telehealth: Payer: Self-pay | Admitting: Primary Care

## 2022-08-10 ENCOUNTER — Ambulatory Visit: Payer: 59

## 2022-08-10 NOTE — Telephone Encounter (Signed)
Called patient to schedule Medicare Annual Wellness Visit (AWV). Left message for patient to call back and schedule Medicare Annual Wellness Visit (AWV).  Last date of AWV: due 04/04/2009 awvi per [facility]  Please schedule an appointment at any time with Abby, NHA. .  If any questions, please contact me at [phone].  Thank you,  Stephanie,  AMB Clinical Support [facility] AWV Program Direct Dial ??[phone]    

## 2022-08-15 ENCOUNTER — Telehealth: Payer: Self-pay | Admitting: *Deleted

## 2022-08-15 ENCOUNTER — Ambulatory Visit: Payer: 59 | Attending: Internal Medicine

## 2022-08-15 ENCOUNTER — Ambulatory Visit (INDEPENDENT_AMBULATORY_CARE_PROVIDER_SITE_OTHER): Payer: Self-pay

## 2022-08-15 NOTE — Telephone Encounter (Signed)
Called pt since he missed his Anticoagulation Clinic Appt; he states he thought it was another day this week & taht he was on his way to the ER. Advised to call back to reschedule his Anticoagulation Clinic appt or call back to let us know if they draw his INR. He verbalized understanding. Will follow up with pt later or await an update from him.

## 2022-08-15 NOTE — Telephone Encounter (Signed)
    Chief Complaint: Left side chest pain, hurts worse with deep breath. Constant now. History of "heart attack." Symptoms: Above Frequency: Last night Pertinent Negatives: Patient denies SOB Disposition: [x] ED /[] Urgent Care (no appt availability in office) / [] Appointment(In office/virtual)/ []  [facility]/ [] Home Care/ [] Refused Recommended Disposition /[] [facility]/ []  Follow-up with PCP Additional Notes: Will have  family take him to ED.Will call 911 for worsening of symptoms. Reason for Disposition  [1] Chest pain (or "angina") comes and goes AND [2] is happening more often (increasing in frequency) or getting worse (increasing in severity)  (Exception: Chest pains that last only a few seconds.)  Answer Assessment - Initial Assessment Questions 1. LOCATION: "Where does it hurt?"       Left side at ribs 2. RADIATION: "Does the pain go anywhere else?" (e.g., into neck, jaw, arms, back)     No 3. ONSET: "When did the chest pain begin?" (Minutes, hours or days)      Last night 4. PATTERN: "Does the pain come and go, or has it been constant since it started?"  "Does it get worse with exertion?"      Constant 5. DURATION: "How long does it last" (e.g., seconds, minutes, hours)     Constant 6. SEVERITY: "How bad is the pain?"  (e.g., Scale 1-10; mild, moderate, or severe)    - MILD (1-3): doesn't interfere with normal activities     - MODERATE (4-7): interferes with normal activities or awakens from sleep    - SEVERE (8-10): excruciating pain, unable to do any normal activities       4 7. CARDIAC RISK FACTORS: "Do you have any history of heart problems or risk factors for heart disease?" (e.g., angina, prior heart attack; diabetes, high blood pressure, high cholesterol, smoker, or strong family history of heart disease)     Heart attack - 2016 8. PULMONARY RISK FACTORS: "Do you have any history of lung disease?"  (e.g., blood clots in lung, asthma, emphysema,  birth control pills)     No 9. CAUSE: "What do you think is causing the chest pain?"     Unsure 10. OTHER SYMPTOMS: "Do you have any other symptoms?" (e.g., dizziness, nausea, vomiting, sweating, fever, difficulty breathing, cough)       Pain with a deep breath 11. PREGNANCY: "Is there any chance you are pregnant?" "When was your last menstrual period?"       N/a  Protocols used: Chest Pain-A-AH

## 2022-08-24 ENCOUNTER — Ambulatory Visit: Payer: 59 | Attending: Cardiovascular Disease | Admitting: *Deleted

## 2022-08-24 DIAGNOSIS — I482 Chronic atrial fibrillation, unspecified: Secondary | ICD-10-CM

## 2022-08-24 DIAGNOSIS — Z7901 Long term (current) use of anticoagulants: Secondary | ICD-10-CM | POA: Diagnosis not present

## 2022-08-24 DIAGNOSIS — I4891 Unspecified atrial fibrillation: Secondary | ICD-10-CM

## 2022-08-24 LAB — POCT INR: INR: 1.4 — AB (ref 2.0–3.0)

## 2022-08-24 MED ORDER — WARFARIN SODIUM 2 MG PO TABS: ORAL_TABLET | ORAL | 0 refills | Status: DC

## 2022-08-24 NOTE — Patient Instructions (Signed)
Description   Since you missed a dose then take 2 tablets today and 2 tablets tomorrow then continue taking 1 tablet daily. REMEMBER TO TAKE YOUR PILLS DAILY.  Recheck INR in 1 week. Coumadin Clinic [phone]

## 2022-08-31 ENCOUNTER — Ambulatory Visit: Payer: 59 | Attending: Cardiology | Admitting: Cardiology

## 2022-08-31 ENCOUNTER — Other Ambulatory Visit: Payer: Self-pay

## 2022-08-31 ENCOUNTER — Encounter: Payer: Self-pay | Admitting: Cardiology

## 2022-08-31 VITALS — BP 134/80 | HR 64 | Ht 75.0 in | Wt 152.1 lb

## 2022-08-31 DIAGNOSIS — Z992 Dependence on renal dialysis: Secondary | ICD-10-CM

## 2022-08-31 DIAGNOSIS — I4819 Other persistent atrial fibrillation: Secondary | ICD-10-CM | POA: Diagnosis not present

## 2022-08-31 DIAGNOSIS — N186 End stage renal disease: Secondary | ICD-10-CM

## 2022-08-31 DIAGNOSIS — I7161 Supraceliac aneurysm of the thoracoabdominal aorta, without rupture: Secondary | ICD-10-CM | POA: Diagnosis not present

## 2022-08-31 DIAGNOSIS — I7143 Infrarenal abdominal aortic aneurysm, without rupture: Secondary | ICD-10-CM

## 2022-08-31 DIAGNOSIS — Z5181 Encounter for therapeutic drug level monitoring: Secondary | ICD-10-CM

## 2022-08-31 MED ORDER — AMIODARONE HCL 200 MG PO TABS: 200.0000 mg | ORAL_TABLET | Freq: Every day | ORAL | 3 refills | Status: DC

## 2022-08-31 NOTE — Telephone Encounter (Signed)
Pt's medication was sent to pt's pharmacy as requested. Confirmation received.  °

## 2022-08-31 NOTE — Patient Instructions (Signed)
Medication Instructions:  The current medical regimen is effective;  continue present plan and medications.  *If you need a refill on your cardiac medications before your next appointment, please call your pharmacy*  Lab Work: Please  have blood work today (CBC,CMP, TSH and FreeT4)  If you have labs (blood work) drawn today and your tests are completely normal, you will receive your results only by: MyChart Message (if you have MyChart) OR A paper copy in the mail If you have any lab test that is abnormal or we need to change your treatment, we will call you to review the results.   Testing/Procedures: Non-Cardiac CT Angiography (CTA), is a special type of CT scan that uses a computer to produce multi-dimensional views of major blood vessels throughout the body. In CT angiography, a contrast material is injected through an IV to help visualize the blood vessels You are due for this testing at the end of July.  Follow-Up: At [facility], you and your health needs are our priority.  As part of our continuing mission to provide you with exceptional heart care, we have created designated Provider Care Teams.  These Care Teams include your primary Cardiologist (physician) and Advanced Practice Providers (APPs -  Physician Assistants and Nurse Practitioners) who all work together to provide you with the care you need, when you need it.  We recommend signing up for the patient portal called "MyChart".  Sign up information is provided on this After Visit Summary.  MyChart is used to connect with patients for Virtual Visits (Telemedicine).  Patients are able to view lab/test results, encounter notes, upcoming appointments, etc.  Non-urgent messages can be sent to your provider as well.   To learn more about what you can do with MyChart, go to [email or web address].    Your next appointment:   1 year(s)  Provider:   Donato Schultz, MD

## 2022-08-31 NOTE — Progress Notes (Signed)
Cardiology Office Note:    Date:  08/31/2022   ID:  Corey [name], DOB 18-Dec-1970, MRN [id number]  PCP:  [name], NP   [facility] Providers Cardiologist:  Donato Schultz, MD     Referring MD: [name], NP    History of Present Illness:    Corey [name] is a 52 y.o. male here for follow-up of chronic type B aortic dissection, atrial fibrillation, chronic paraplegia related to aortic dissection with subsequent repair in 2004 in [place].  Followed also by Dr. Maren Beach.  Previous thoracic aortic dimension of 5.2 cm, stable over past few years  Also has end-stage renal disease on hemodialysis as well as blindness.  Dr. Sherral Hammers has seen him in the past with a 3 cm small ulcerated plaque saccular aneurysm in the abdominal region.  Overall doing well without any chest pain fevers chills nausea vomiting syncope bleeding.  He did have some abdominal discomfort that was transient  Overall feeling well.  Past Medical History:  Diagnosis Date   Arthritis    hands and shoulders   Blindness and low vision    "Stargardt disease"   Dissection of aorta (HCC) 2004   a. s/p extensive repeair in 2004 in [place] complicated by ESRD, lower extremity paralysis, coma, and extended hospitalization of 2 years   ESRD (end stage renal disease) (HCC)    a. TTS   Headache    History of cardioversion 2014   Hypertension    Neuropathy    Non-healing non-surgical wound 03/2016   PAF (paroxysmal atrial fibrillation) (HCC)    a. s/p DCCV in 2014; b. on Coumadin; c. CHADS2VASc => 2 (HTN, vascular disease)   Paralysis (HCC)    due to dissection of aorta in 2004, lower extremities   Pneumonia     Past Surgical History:  Procedure Laterality Date   APPLICATION OF WOUND VAC Left 04/13/2016   Procedure: APPLICATION OF WOUND VAC;  Surgeon: Fransisco Hertz, MD;  Location: [facility] OR;  Service: Vascular;  Laterality: Left;   APPLICATION OF WOUND VAC Left 04/18/2016   Procedure:  APPLICATION OF WOUND VAC;  Surgeon: Maeola Harman, MD;  Location: [facility] OR;  Service: Vascular;  Laterality: Left;  Wound vac change    APPLICATION OF WOUND VAC Left 04/20/2016   Procedure: WOUND VAC CHANGE;  Surgeon: Fransisco Hertz, MD;  Location: [facility] OR;  Service: Vascular;  Laterality: Left;   AV FISTULA PLACEMENT     BASCILIC VEIN TRANSPOSITION Left 12/09/2015   Procedure: FIRST STAGE BASILIC VEIN TRANSPOSITION LEFT UPPER ARM;  Surgeon: Fransisco Hertz, MD;  Location: [facility] OR;  Service: Vascular;  Laterality: Left;   BASCILIC VEIN TRANSPOSITION Left 03/09/2016   Procedure: SECOND STAGE BASILIC VEIN TRANSPOSITION WITH REVISION OF ANASTOMOSIS LEFT UPPER ARM;  Surgeon: Fransisco Hertz, MD;  Location: [facility] OR;  Service: Vascular;  Laterality: Left;   CARDIOVERSION     CARDIOVERSION N/A 01/04/2019   Procedure: CARDIOVERSION;  Surgeon: Lewayne Bunting, MD;  Location: [facility] ENDOSCOPY;  Service: Cardiovascular;  Laterality: N/A;   REPAIR OF ACUTE ASCENDING THORACIC AORTIC DISSECTION     REVISON OF ARTERIOVENOUS FISTULA Left 04/20/2016   Procedure: LIGATION OF BASILIC VEIN TRANSPOSITION;  Surgeon: Fransisco Hertz, MD;  Location: [facility] OR;  Service: Vascular;  Laterality: Left;   WOUND DEBRIDEMENT Left 04/13/2016   Procedure: DEBRIDEMENT WOUND;  Surgeon: Fransisco Hertz, MD;  Location: [facility] OR;  Service: Vascular;  Laterality: Left;  Current Medications: Current Meds  Medication Sig   amiodarone (PACERONE) 200 MG tablet Take 1 tablet (200 mg total) by mouth daily.   cinacalcet (SENSIPAR) 60 MG tablet Take 120 mg by mouth every evening.   docusate sodium (COLACE) 100 MG capsule Take 1 capsule (100 mg total) by mouth every 12 (twelve) hours.   metoprolol tartrate (LOPRESSOR) 25 MG tablet Take 0.5 tablets (12.5 mg total) by mouth 2 (two) times daily. Hold on the AM of dialysis days   Multiple Vitamins-Minerals (MULTIVITAMIN WITH MINERALS) tablet Take 1 tablet by mouth daily.   RENVELA 2.4 g PACK Take 4.8 g by mouth in  the morning, at noon, and at bedtime.   warfarin (COUMADIN) 2 MG tablet Take 1 tablet by mouth daily or as directed by Anticoagulation Clinic.     Allergies:   Ciprofloxacin, Heparin, Doxercalciferol, and Quinolones   Social History   Socioeconomic History   Marital status: Divorced    Spouse name: Not on file   Number of children: 2   Years of education: Not on file   Highest education level: Not on file  Occupational History   Occupation: DIABLED  Tobacco Use   Smoking status: Never   Smokeless tobacco: Never  Vaping Use   Vaping Use: Never used  Substance and Sexual Activity   Alcohol use: No   Drug use: No   Sexual activity: Not on file  Other Topics Concern   Not on file  Social History Narrative   Not on file   Social Determinants of Health   Financial Resource Strain: Not on file  Food Insecurity: Not on file  Transportation Needs: Not on file  Physical Activity: Not on file  Stress: Not on file  Social Connections: Not on file     Family History: The patient's family history includes Cancer in his father and mother; Hypertension in his father and mother; Stroke in his brother; Thyroid disease in his sister.  ROS:   Please see the history of present illness.     All other systems reviewed and are negative.  EKGs/Labs/Other Studies Reviewed:    The following studies were reviewed today:  11/01/2021: CT 1. Type B aortic dissection with unchanged extent of the dissection flap. Distal arch measures up to 4.5 cm (measured using multi planar reformatted images), unchanged when compared to prior exam and remeasured in similar plane. 2. Stable focal outpouching of the distal descending thoracic aorta, likely a chronic penetrating atherosclerotic ulcer, unchanged in size when compared with January 18, 2017 prior, but demonstrates increased thrombosis. 3. Mild pulmonary edema. 4. Innumerable right renal lesions of varying complexity again (Bosniak I and II)  seen, compatible with multi cystic kidney disease. Lesions could be completely characterized with renal protocol CT or MRI.  Cardiac Studies & Procedures       ECHOCARDIOGRAM  ECHOCARDIOGRAM COMPLETE 05/20/2018  Narrative ECHOCARDIOGRAM REPORT    Patient Name:   Corey [name] Date of Exam: 05/20/2018 Medical Rec #:  [id number]  Height:       74.0 in Accession #:    [id number] Weight:       192.0 lb Date of Birth:  07-Dec-1970  BSA:          2.14 m Patient Age:    47 years   BP:           103/60 mmHg Patient Gender: M          HR:  60 bpm. Exam Location:  Inpatient   Procedure: 2D Echo  Indications:    Atrial Fibrillation 427.31 / I48.91  History:        Patient has prior history of Echocardiogram examinations, most recent 01/19/2017. CHF; Risk Factors: Hypertension. Long term (current) use of anticoagulants, End-stage renal disease on hemodialysis.  Sonographer:    Celesta Gentile RCS Referring Phys: [id number] Raymon Mutton DUNN  IMPRESSIONS   1. The left ventricle has normal systolic function with an ejection fraction of 60-65%. The cavity size was normal. There is mildly increased left ventricular wall thickness. Left ventricular diastology could not be evaluated due to nondiagnostic images. No evidence of left ventricular regional wall motion abnormalities. 2. The right ventricle has normal systolic function. The cavity was normal. There is no increase in right ventricular wall thickness. Right ventricular systolic pressure is moderately elevated with an estimated pressure of 42.8 mmHg. 3. The pericardial effusion is posterior to the left ventricle. 4. Trivial pericardial effusion. 5. The mitral valve is normal in structure. 6. The tricuspid valve is normal in structure. 7. The aortic valve is tricuspid Mild sclerosis of the aortic valve. 8. The pulmonic valve was normal in structure. 9. The inferior vena cava was dilated in size with >50% respiratory variability. 10. No  evidence of left ventricular regional wall motion abnormalities. 11. Right atrial pressure is estimated at 8 mmHg.  FINDINGS Left Ventricle: The left ventricle has normal systolic function, with an ejection fraction of 60-65%. The cavity size was normal. There is mildly increased left ventricular wall thickness. Left ventricular diastology could not be evaluated due to nondiagnostic images. No evidence of left ventricular regional wall motion abnormalities.. Right Ventricle: The right ventricle has normal systolic function. The cavity was normal. There is no increase in right ventricular wall thickness. Right ventricular systolic pressure is moderately elevated with an estimated pressure of 42.8 mmHg. Left Atrium: left atrial size was normal in size Right Atrium: right atrial size was normal in size Right atrial pressure is estimated at 8 mmHg. Interatrial Septum: No atrial level shunt detected by color flow Doppler. Pericardium: Trivial pericardial effusion is present. The pericardial effusion is posterior to the left ventricle. Mitral Valve: The mitral valve is normal in structure. Mitral valve regurgitation is trivial by color flow Doppler. Tricuspid Valve: The tricuspid valve is normal in structure. Tricuspid valve regurgitation is mild by color flow Doppler. Aortic Valve: The aortic valve is tricuspid Mild sclerosis of the aortic valve Aortic valve regurgitation was not visualized by color flow Doppler. Pulmonic Valve: The pulmonic valve was normal in structure. Pulmonic valve regurgitation is not visualized by color flow Doppler. Venous: The inferior vena cava is dilated in size with greater than 50% respiratory variability.  LEFT VENTRICLE PLAX 2D (Teich)              Biplane EF (MOD) LV EF:          60.1 %       LV Biplane EF:   62.4 % LVIDd:          4.40 cm      LV A4C EF:       64.2 % LVIDs:          3.00 cm      LV A2C EF:       63.2 % LV PW:          1.40 cm LV IVS:         1.10  cm  Diastology LVOT diam:      2.20 cm      LV e' lateral:   6.96 cm/s LV SV:          53 ml        LV E/e' lateral: 14.0 LVOT Area:      3.80 cm     LV e' medial:    5.98 cm/s LV E/e' medial:  16.3 LV Volumes (MOD) LV area d, A2C:    31.90 cm LV area d, A4C:    32.60 cm LV area s, A2C:    17.90 cm LV area s, A4C:    17.00 cm LV major d, A2C:   9.57 cm LV major d, A4C:   9.66 cm LV major s, A2C:   8.33 cm LV major s, A4C:   7.68 cm LV vol d, MOD A2C: 92.4 ml LV vol d, MOD A4C: 91.7 ml LV vol s, MOD A2C: 34.0 ml LV vol s, MOD A4C: 32.8 ml LV SV MOD A2C:     58.4 ml LV SV MOD A4C:     91.7 ml LV SV MOD BP:      57.3 ml  RIGHT VENTRICLE RV S prime:     12.60 cm/s TAPSE (M-mode): 1.7 cm RVSP:           42.8 mmHg  LEFT ATRIUM           Index       RIGHT ATRIUM           Index LA diam:      3.30 cm 1.54 cm/m  RA Pressure: 8 mmHg LA Vol (A2C): 46.1 ml 21.58 ml/m RA Area:     15.50 cm LA Vol (A4C): 48.2 ml 22.56 ml/m RA Volume:   36.70 ml  17.18 ml/m AORTIC VALVE LVOT Vmax:   89.40 cm/s LVOT Vmean:  63.200 cm/s LVOT VTI:    0.206 m  AORTA Ao Root diam: 3.10 cm  MITRAL VALVE              TRICUSPID VALVE MV Area (PHT): 4.49 cm   TR Peak grad:   34.8 mmHg MV PHT:        49.01 msec TR Vmax:        295.00 cm/s MV Decel Time: 169 msec   RVSP:           42.8 mmHg MV E velocity: 97.50 cm/s MV A velocity: 32.60 cm/s MV E/A ratio:  2.99   Armanda Magic MD Electronically signed by Armanda Magic MD Signature Date/Time: 05/20/2018/11:33:10 AM    Final    MONITORS  CARDIAC EVENT MONITOR 10/24/2016  Narrative  Paroxysmal Atrial fibrillation present (2%) with occasional rapid ventricular response (RVR). Majortiy of AFIB is just above 100 BPM  No pauses > 3 seconds  Majority of tracings are normal sinus rhythm (98%)  Continue current therapy. On anticoagulation.  Donato Schultz, MD            EKG: 08/31/2022-normal sinus rhythm 62 septal infarct  pattern  Recent Labs: No results found for requested labs within last 365 days.  Recent Lipid Panel    Component Value Date/Time   CHOL 150 05/20/2018 0243   TRIG 148 05/20/2018 0243   HDL 27 (L) 05/20/2018 0243   CHOLHDL 5.6 05/20/2018 0243   VLDL 30 05/20/2018 0243   LDLCALC 93 05/20/2018 0243     Risk Assessment/Calculations:  Physical Exam:    VS:  BP 134/80   Pulse 64   Ht 6\' 3"  (1.905 m)   Wt 152 lb 1.9 oz (69 kg)   SpO2 97%   BMI 19.01 kg/m     Wt Readings from Last 3 Encounters:  08/31/22 152 lb 1.9 oz (69 kg)  11/12/21 159 lb (72.1 kg)  04/21/21 165 lb (74.8 kg)     GEN:  Well nourished, well developed in no acute distress HEENT: Normal NECK: No JVD; No carotid bruits LYMPHATICS: No lymphadenopathy CARDIAC: RRR, no murmurs, rubs, gallops RESPIRATORY:  Clear to auscultation without rales, wheezing or rhonchi  ABDOMEN: Soft, non-tender, non-distended MUSCULOSKELETAL:  +LE edema; No deformity  SKIN: Warm and dry NEUROLOGIC:  Alert and oriented x 3, in wheelchair PSYCHIATRIC:  Normal affect   ASSESSMENT:    1. Persistent atrial fibrillation (HCC)   2. Infrarenal abdominal aortic aneurysm (AAA) without rupture (HCC)   3. 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 ESRD on dialysis (HCC)   5. Encounter for therapeutic drug monitoring    PLAN:    In order of problems listed above:  Persistent atrial fibrillation - Has been on amiodarone and metoprolol.  High risk medication management.  Following lab work closely TSH metabolic profile CBC.  Rate controlled.  On Coumadin.  EKG today shows normal sinus rhythm  Thoracicoabdominal aortic aneurysm -Dr. Sherral Hammers following with vascular surgery and Dr. Dorris Fetch had been following as well with cardiothoracic surgery.  Surgical repair would be high risk given comorbidities.  AAA - Previous notes reviewed from Dr. Sherral Hammers.  Ulcerated plaque saccular aneurysm 3 cm.   Abdominal.  End-stage renal disease on dialysis - Per nephrology.  Dilated pancreatic duct seen on CT in 2022, subsequent CT did not notice this.previously asked GI to review.  On most recent CT however this was not noticed.          Medication Adjustments/Labs and Tests Ordered: Current medicines are reviewed at length with the patient today.  Concerns regarding medicines are outlined above.  Orders Placed This Encounter  Procedures   CT Angio Chest/Abd/Pel for Dissection W and/or W/WO   CBC   Comprehensive metabolic panel   TSH   T4, free   EKG 12-Lead   No orders of the defined types were placed in this encounter.   Patient Instructions  Medication Instructions:  The current medical regimen is effective;  continue present plan and medications.  *If you need a refill on your cardiac medications before your next appointment, please call your pharmacy*  Lab Work: Please  have blood work today (CBC,CMP, TSH and FreeT4)  If you have labs (blood work) drawn today and your tests are completely normal, you will receive your results only by: MyChart Message (if you have MyChart) OR A paper copy in the mail If you have any lab test that is abnormal or we need to change your treatment, we will call you to review the results.   Testing/Procedures: Non-Cardiac CT Angiography (CTA), is a special type of CT scan that uses a computer to produce multi-dimensional views of major blood vessels throughout the body. In CT angiography, a contrast material is injected through an IV to help visualize the blood vessels You are due for this testing at the end of July.  Follow-Up: At [facility], you and your health needs are our priority.  As part of our continuing mission to provide you with exceptional heart care, we  have created designated Provider Care Teams.  These Care Teams include your primary Cardiologist (physician) and Advanced Practice Providers (APPs -  Physician  Assistants and Nurse Practitioners) who all work together to provide you with the care you need, when you need it.  We recommend signing up for the patient portal called "MyChart".  Sign up information is provided on this After Visit Summary.  MyChart is used to connect with patients for Virtual Visits (Telemedicine).  Patients are able to view lab/test results, encounter notes, upcoming appointments, etc.  Non-urgent messages can be sent to your provider as well.   To learn more about what you can do with MyChart, go to [email or web address].    Your next appointment:   1 year(s)  Provider:   Donato Schultz, MD       Signed, Donato Schultz, MD  08/31/2022 11:24 AM    [facility]

## 2022-09-01 LAB — COMPREHENSIVE METABOLIC PANEL
ALT: 7 IU/L (ref 0–44)
AST: 7 IU/L (ref 0–40)
Albumin/Globulin Ratio: 1.3 (ref 1.2–2.2)
Albumin: 3.5 g/dL — ABNORMAL LOW (ref 3.8–4.9)
Alkaline Phosphatase: 520 IU/L — ABNORMAL HIGH (ref 44–121)
BUN/Creatinine Ratio: 6 — ABNORMAL LOW (ref 9–20)
BUN: 57 mg/dL — ABNORMAL HIGH (ref 6–24)
Bilirubin Total: 0.5 mg/dL (ref 0.0–1.2)
CO2: 26 mmol/L (ref 20–29)
Calcium: 9.8 mg/dL (ref 8.7–10.2)
Chloride: 101 mmol/L (ref 96–106)
Creatinine, Ser: 9.39 mg/dL — ABNORMAL HIGH (ref 0.76–1.27)
Globulin, Total: 2.8 g/dL (ref 1.5–4.5)
Glucose: 92 mg/dL (ref 70–99)
Potassium: 5 mmol/L (ref 3.5–5.2)
Sodium: 143 mmol/L (ref 134–144)
Total Protein: 6.3 g/dL (ref 6.0–8.5)
eGFR: 6 mL/min/{1.73_m2} — ABNORMAL LOW (ref >59)

## 2022-09-01 LAB — CBC
Hematocrit: 34.7 % — ABNORMAL LOW (ref 37.5–51.0)
Hemoglobin: 11.4 g/dL — ABNORMAL LOW (ref 13.0–17.7)
MCH: 30.4 pg (ref 26.6–33.0)
MCHC: 32.9 g/dL (ref 31.5–35.7)
MCV: 93 fL (ref 79–97)
Platelets: 135 10*3/uL — ABNORMAL LOW (ref 150–450)
RBC: 3.75 x10E6/uL — ABNORMAL LOW (ref 4.14–5.80)
RDW: 13.4 % (ref 11.6–15.4)
WBC: 6.6 10*3/uL (ref 3.4–10.8)

## 2022-09-01 LAB — TSH: TSH: 1.9 u[IU]/mL (ref 0.450–4.500)

## 2022-09-01 LAB — T4, FREE: Free T4: 1.27 ng/dL (ref 0.82–1.77)

## 2022-09-02 ENCOUNTER — Ambulatory Visit: Payer: 59

## 2022-09-09 ENCOUNTER — Telehealth: Payer: Self-pay | Admitting: *Deleted

## 2022-09-09 ENCOUNTER — Ambulatory Visit: Payer: 59

## 2022-09-09 NOTE — Telephone Encounter (Signed)
Called pt since he missed another Anticoagulation Clinic Appt; there was no answer therefore, left another message for him to call back to reschedule to a time and date he can make the appt. Will await a follow up/call back.

## 2022-09-09 NOTE — Telephone Encounter (Signed)
Pt called back to reschedule missed appt; advised that we need to reschedule to a day/time he will be able to make it so he does not continue to miss appts. He gave a date and offered available time that he accepted. Advised to come to that appt or call back if he can't and he stated he would do so.

## 2022-09-14 ENCOUNTER — Ambulatory Visit: Payer: 59

## 2022-09-14 ENCOUNTER — Telehealth: Payer: Self-pay | Admitting: *Deleted

## 2022-09-14 NOTE — Telephone Encounter (Signed)
Called pt since he missed his Anticoagulation appt today; he states he thought he made it for Friday. Advised I spoke with him on 09/09/22 and there was no Friday availability so he chose Wednesday. He apologized for missing another appt. Also, confirmed appt for Monday 09/19/22 and he was appreciative.

## 2022-09-19 ENCOUNTER — Ambulatory Visit: Payer: 59 | Attending: Cardiovascular Disease

## 2022-09-19 DIAGNOSIS — I482 Chronic atrial fibrillation, unspecified: Secondary | ICD-10-CM | POA: Diagnosis not present

## 2022-09-19 DIAGNOSIS — Z7901 Long term (current) use of anticoagulants: Secondary | ICD-10-CM

## 2022-09-19 DIAGNOSIS — I4891 Unspecified atrial fibrillation: Secondary | ICD-10-CM

## 2022-09-19 LAB — POCT INR: INR: 1.5 — AB (ref 2.0–3.0)

## 2022-09-19 MED ORDER — WARFARIN SODIUM 2 MG PO TABS
ORAL_TABLET | ORAL | 0 refills | Status: DC
Start: 2022-09-19 — End: 2022-11-29

## 2022-09-19 NOTE — Patient Instructions (Signed)
TAKE 2 tablets today then continue taking 1 tablet daily. REMEMBER TO TAKE YOUR PILLS DAILY.  Recheck INR in 2 weeks. Coumadin Clinic [phone]

## 2022-09-26 ENCOUNTER — Ambulatory Visit (INDEPENDENT_AMBULATORY_CARE_PROVIDER_SITE_OTHER): Payer: 59

## 2022-09-26 ENCOUNTER — Encounter (INDEPENDENT_AMBULATORY_CARE_PROVIDER_SITE_OTHER): Payer: Self-pay

## 2022-09-26 VITALS — BP 124/70 | Ht 75.0 in | Wt 151.8 lb

## 2022-09-26 DIAGNOSIS — Z0000 Encounter for general adult medical examination without abnormal findings: Secondary | ICD-10-CM | POA: Diagnosis not present

## 2022-09-26 DIAGNOSIS — Z0101 Encounter for examination of eyes and vision with abnormal findings: Secondary | ICD-10-CM

## 2022-09-26 DIAGNOSIS — Z1211 Encounter for screening for malignant neoplasm of colon: Secondary | ICD-10-CM

## 2022-09-26 DIAGNOSIS — H3553 Other dystrophies primarily involving the sensory retina: Secondary | ICD-10-CM

## 2022-09-26 NOTE — Patient Instructions (Signed)
Corey [name] , Thank you for taking time to come for your Medicare Wellness Visit. I appreciate your ongoing commitment to your health goals. Please review the following plan we discussed and let me know if I can assist you in the future.   These are the goals we discussed:  Goals      Patient Stated     Patient wants to begin physical therapy to work on strength training and increasing endurance.         This is a list of the screening recommended for you and due dates:  Health Maintenance  Topic Date Due   COVID-19 Vaccine (1) Never done   DTaP/Tdap/Td vaccine (1 - Tdap) Never done   Colon Cancer Screening  Never done   Zoster (Shingles) Vaccine (1 of 2) Never done   Hepatitis C Screening  11/13/2022*   Flu Shot  11/03/2022   Medicare Annual Wellness Visit  09/26/2023   HIV Screening  Completed   HPV Vaccine  Aged Out  *Topic was postponed. The date shown is not the original due date.    Advanced directives: Advance directive discussed with you today. Even though you declined this today, please call our office should you change your mind, and we can give you the proper paperwork for you to fill out. Advance care planning is a way to make decisions about medical care that fits your values in case you are ever unable to make these decisions for yourself.  Information on Advanced Care Planning can be found at [facility] Advance Health Care Directives Advance Health Care Directives ([email or web address]    Conditions/risks identified:  You are due for a colonoscopy. A referral has been placed for you today. If you haven't heard from them in 1 week please call and schedule your appt.   [facility] [address] [place] [address] [place],  [place]  [address] Main: [phone]  We scheduled you an appt to see Gwinda Passe on October 12, 2022 to discuss recent weight loss and losing strength. Please arrive 15 minutes early for this appt  Next appointment:  VIRTUAL/ TELEPHONE VISIT Follow up in one year for your annual wellness visit October 02, 2023 at 1:00pm    Preventive Care 40-64 Years, Male Preventive care refers to lifestyle choices and visits with your health care provider that can promote health and wellness. What does preventive care include? A yearly physical exam. This is also called an annual well check. Dental exams once or twice a year. Routine eye exams. Ask your health care provider how often you should have your eyes checked. Personal lifestyle choices, including: Daily care of your teeth and gums. Regular physical activity. Eating a healthy diet. Avoiding tobacco and drug use. Limiting alcohol use. Practicing safe sex. Taking low-dose aspirin every day starting at age 52. What happens during an annual well check? The services and screenings done by your health care provider during your annual well check will depend on your age, overall health, lifestyle risk factors, and family history of disease. Counseling  Your health care provider may ask you questions about your: Alcohol use. Tobacco use. Drug use. Emotional well-being. Home and relationship well-being. Sexual activity. Eating habits. Work and work Astron[employer]. Screening  You may have the following tests or measurements: Height, weight, and BMI. Blood pressure. Lipid and cholesterol levels. These may be checked every 5 years, or more frequently if you are over 77 years old. Skin check. Lung cancer screening.  You may have this screening every year starting at age 65 if you have a 30-pack-year history of smoking and currently smoke or have quit within the past 15 years. Fecal occult blood test (FOBT) of the stool. You may have this test every year starting at age 23. Flexible sigmoidoscopy or colonoscopy. You may have a sigmoidoscopy every 5 years or a colonoscopy every 10 years starting at age 67. Prostate cancer screening. Recommendations will vary depending  on your family history and other risks. Hepatitis C blood test. Hepatitis B blood test. Sexually transmitted disease (STD) testing. Diabetes screening. This is done by checking your blood sugar (glucose) after you have not eaten for a while (fasting). You may have this done every 1-3 years. Discuss your test results, treatment options, and if necessary, the need for more tests with your health care provider. Vaccines  Your health care provider may recommend certain vaccines, such as: Influenza vaccine. This is recommended every year. Tetanus, diphtheria, and acellular pertussis (Tdap, Td) vaccine. You may need a Td booster every 10 years. Zoster vaccine. You may need this after age 54. Pneumococcal 13-valent conjugate (PCV13) vaccine. You may need this if you have certain conditions and have not been vaccinated. Pneumococcal polysaccharide (PPSV23) vaccine. You may need one or two doses if you smoke cigarettes or if you have certain conditions. Talk to your health care provider about which screenings and vaccines you need and how often you need them. This information is not intended to replace advice given to you by your health care provider. Make sure you discuss any questions you have with your health care provider. Document Released: 04/17/2015 Document Revised: 12/09/2015 Document Reviewed: 01/20/2015 Elsevier Interactive Patient Education  2017 [employer].  Fall Prevention in the Home Falls can cause injuries. They can happen to people of all ages. There are many things you can do to make your home safe and to help prevent falls. What can I do on the outside of my home? Regularly fix the edges of walkways and driveways and fix any cracks. Remove anything that might make you trip as you walk through a door, such as a raised step or threshold. Trim any bushes or trees on the path to your home. Use bright outdoor lighting. Clear any walking paths of anything that might make someone  trip, such as rocks or tools. Regularly check to see if handrails are loose or broken. Make sure that both sides of any steps have handrails. Any raised decks and porches should have guardrails on the edges. Have any leaves, snow, or ice cleared regularly. Use sand or salt on walking paths during winter. Clean up any spills in your garage right away. This includes oil or grease spills. What can I do in the bathroom? Use night lights. Install grab bars by the toilet and in the tub and shower. Do not use towel bars as grab bars. Use non-skid mats or decals in the tub or shower. If you need to sit down in the shower, use a plastic, non-slip stool. Keep the floor dry. Clean up any water that spills on the floor as soon as it happens. Remove soap buildup in the tub or shower regularly. Attach bath mats securely with double-sided non-slip rug tape. Do not have throw rugs and other things on the floor that can make you trip. What can I do in the bedroom? Use night lights. Make sure that you have a light by your bed that is easy to reach. Do  not use any sheets or blankets that are too big for your bed. They should not hang down onto the floor. Have a firm chair that has side arms. You can use this for support while you get dressed. Do not have throw rugs and other things on the floor that can make you trip. What can I do in the kitchen? Clean up any spills right away. Avoid walking on wet floors. Keep items that you use a lot in easy-to-reach places. If you need to reach something above you, use a strong step stool that has a grab bar. Keep electrical cords out of the way. Do not use floor polish or wax that makes floors slippery. If you must use wax, use non-skid floor wax. Do not have throw rugs and other things on the floor that can make you trip. What can I do with my stairs? Do not leave any items on the stairs. Make sure that there are handrails on both sides of the stairs and use them.  Fix handrails that are broken or loose. Make sure that handrails are as long as the stairways. Check any carpeting to make sure that it is firmly attached to the stairs. Fix any carpet that is loose or worn. Avoid having throw rugs at the top or bottom of the stairs. If you do have throw rugs, attach them to the floor with carpet tape. Make sure that you have a light switch at the top of the stairs and the bottom of the stairs. If you do not have them, ask someone to add them for you. What else can I do to help prevent falls? Wear shoes that: Do not have high heels. Have rubber bottoms. Are comfortable and fit you well. Are closed at the toe. Do not wear sandals. If you use a stepladder: Make sure that it is fully opened. Do not climb a closed stepladder. Make sure that both sides of the stepladder are locked into place. Ask someone to hold it for you, if possible. Clearly Hersh and make sure that you can see: Any grab bars or handrails. First and last steps. Where the edge of each step is. Use tools that help you move around (mobility aids) if they are needed. These include: Canes. Walkers. Scooters. Crutches. Turn on the lights when you go into a dark area. Replace any light bulbs as soon as they burn out. Set up your furniture so you have a clear path. Avoid moving your furniture around. If any of your floors are uneven, fix them. If there are any pets around you, be aware of where they are. Review your medicines with your doctor. Some medicines can make you feel dizzy. This can increase your chance of falling. Ask your doctor what other things that you can do to help prevent falls. This information is not intended to replace advice given to you by your health care provider. Make sure you discuss any questions you have with your health care provider. Document Released: 01/15/2009 Document Revised: 08/27/2015 Document Reviewed: 04/25/2014 Elsevier Interactive Patient Education  2017  [employer].

## 2022-09-26 NOTE — Progress Notes (Signed)
Subjective:   Corey [name] is a 52 y.o. male who presents for an Initial Medicare Annual Wellness Visit.  Visit Complete: Virtual  I connected with  Corey [name] on 09/26/22 by a audio enabled telemedicine application and verified that I am speaking with the correct person using two identifiers.  Patient Location: Home  Provider Location: Office/Clinic  I discussed the limitations of evaluation and management by telemedicine. The patient expressed understanding and agreed to proceed.  Patient Medicare AWV questionnaire was completed by the patient on n/a; I have confirmed that all information answered by patient is correct and no changes since this date.  Review of Systems     Cardiac Risk Factors include: hypertension;male gender;sedentary lifestyle;Other (see comment), Risk factor comments: history of AAA repair     Objective:    Today's Vitals   09/26/22 1324 09/26/22 1325  BP: 124/70   Weight: 151 lb 12.8 oz (68.9 kg)   Height: 6\' 3"  (1.905 m)   PainSc:  6    Body mass index is 18.97 kg/m.     09/26/2022    1:24 PM 12/10/2020    6:55 AM 01/04/2019   10:44 AM 06/06/2018    6:42 AM 05/19/2018    4:27 PM 05/19/2018    8:46 AM 01/19/2017    2:00 AM  Advanced Directives  Does Patient Have a Medical Advance Directive? No No No No No No No  Would patient like information on creating a medical advance directive? No - Patient declined  No - Patient declined  No - Patient declined No - Patient declined No - Patient declined    Current Medications (verified) Outpatient Encounter Medications as of 09/26/2022  Medication Sig   amiodarone (PACERONE) 200 MG tablet Take 1 tablet (200 mg total) by mouth daily.   cinacalcet (SENSIPAR) 60 MG tablet Take 120 mg by mouth every evening.   metoprolol tartrate (LOPRESSOR) 25 MG tablet Take 0.5 tablets (12.5 mg total) by mouth 2 (two) times daily. Hold on the AM of dialysis days   RENVELA 2.4 g PACK Take 4.8 g by mouth in the morning, at  noon, and at bedtime.   warfarin (COUMADIN) 2 MG tablet Take 1 tablet by mouth daily or as directed by Anticoagulation Clinic.   docusate sodium (COLACE) 100 MG capsule Take 1 capsule (100 mg total) by mouth every 12 (twelve) hours.   Multiple Vitamins-Minerals (MULTIVITAMIN WITH MINERALS) tablet Take 1 tablet by mouth daily.   No facility-administered encounter medications on file as of 09/26/2022.    Allergies (verified) Ciprofloxacin, Heparin, Doxercalciferol, and Quinolones   History: Past Medical History:  Diagnosis Date   Arthritis    hands and shoulders   Blindness and low vision    "Stargardt disease"   Dissection of aorta (HCC) 2004   a. s/p extensive repeair in 2004 in [place] complicated by ESRD, lower extremity paralysis, coma, and extended hospitalization of 2 years   ESRD (end stage renal disease) (HCC)    a. TTS   Headache    History of cardioversion 2014   Hypertension    Neuropathy    Non-healing non-surgical wound 03/2016   PAF (paroxysmal atrial fibrillation) (HCC)    a. s/p DCCV in 2014; b. on Coumadin; c. CHADS2VASc => 2 (HTN, vascular disease)   Paralysis (HCC)    due to dissection of aorta in 2004, lower extremities   Pneumonia    Past Surgical History:  Procedure Laterality Date   APPLICATION OF WOUND  VAC Left 04/13/2016   Procedure: APPLICATION OF WOUND VAC;  Surgeon: Fransisco Hertz, MD;  Location: [facility] OR;  Service: Vascular;  Laterality: Left;   APPLICATION OF WOUND VAC Left 04/18/2016   Procedure: APPLICATION OF WOUND VAC;  Surgeon: Maeola Harman, MD;  Location: [facility] OR;  Service: Vascular;  Laterality: Left;  Wound vac change    APPLICATION OF WOUND VAC Left 04/20/2016   Procedure: WOUND VAC CHANGE;  Surgeon: Fransisco Hertz, MD;  Location: [facility] OR;  Service: Vascular;  Laterality: Left;   AV FISTULA PLACEMENT     BASCILIC VEIN TRANSPOSITION Left 12/09/2015   Procedure: FIRST STAGE BASILIC VEIN TRANSPOSITION LEFT UPPER ARM;  Surgeon: Fransisco Hertz, MD;   Location: [facility] OR;  Service: Vascular;  Laterality: Left;   BASCILIC VEIN TRANSPOSITION Left 03/09/2016   Procedure: SECOND STAGE BASILIC VEIN TRANSPOSITION WITH REVISION OF ANASTOMOSIS LEFT UPPER ARM;  Surgeon: Fransisco Hertz, MD;  Location: [facility] OR;  Service: Vascular;  Laterality: Left;   CARDIOVERSION     CARDIOVERSION N/A 01/04/2019   Procedure: CARDIOVERSION;  Surgeon: Lewayne Bunting, MD;  Location: [facility] ENDOSCOPY;  Service: Cardiovascular;  Laterality: N/A;   REPAIR OF ACUTE ASCENDING THORACIC AORTIC DISSECTION     REVISON OF ARTERIOVENOUS FISTULA Left 04/20/2016   Procedure: LIGATION OF BASILIC VEIN TRANSPOSITION;  Surgeon: Fransisco Hertz, MD;  Location: [facility] OR;  Service: Vascular;  Laterality: Left;   WOUND DEBRIDEMENT Left 04/13/2016   Procedure: DEBRIDEMENT WOUND;  Surgeon: Fransisco Hertz, MD;  Location: [facility] OR;  Service: Vascular;  Laterality: Left;   Family History  Problem Relation Age of Onset   Cancer Mother    Hypertension Mother    Cancer Father    Hypertension Father    Thyroid disease Sister    Stroke Brother        21   Social History   Socioeconomic History   Marital status: Divorced    Spouse name: Not on file   Number of children: 2   Years of education: Not on file   Highest education level: Not on file  Occupational History   Occupation: DIABLED  Tobacco Use   Smoking status: Never   Smokeless tobacco: Never  Vaping Use   Vaping Use: Never used  Substance and Sexual Activity   Alcohol use: No   Drug use: No   Sexual activity: Not on file  Other Topics Concern   Not on file  Social History Narrative   Not on file   Social Determinants of Health   Financial Resource Strain: Medium Risk (09/26/2022)   Overall Financial Resource Strain (CARDIA)    Difficulty of Paying Living Expenses: Somewhat hard  Food Insecurity: No Food Insecurity (09/26/2022)   Hunger Vital Sign    Worried About Running Out of Food in the Last Year: Never true    Ran Out of Food in the  Last Year: Never true  Transportation Needs: No Transportation Needs (09/26/2022)   PRAPARE - Administrator, Civil Service (Medical): No    Lack of Transportation (Non-Medical): No  Physical Activity: Inactive (09/26/2022)   Exercise Vital Sign    Days of Exercise per Week: 0 days    Minutes of Exercise per Session: 0 min  Stress: No Stress Concern Present (09/26/2022)   [employer] of Occupational Health - Occupational Stress Questionnaire    Feeling of Stress : Not at all  Social Connections: Socially Isolated (09/26/2022)   Social Connection  and Isolation Panel [NHANES]    Frequency of Communication with Friends and Family: More than three times a week    Frequency of Social Gatherings with Friends and Family: Twice a week    Attends Religious Services: Never    Database administrator or Organizations: No    Attends Engineer, structural: Never    Marital Status: Divorced    Tobacco Counseling Counseling given: Yes   Clinical Intake:  Pre-visit preparation completed: Yes  Pain : 0-10 Pain Score: 6  Pain Type: Chronic pain Pain Location: Back (feet, legs, neck) Pain Orientation: Lower Pain Descriptors / Indicators: Burning, Constant, Pins and needles Pain Onset: More than a month ago Pain Frequency: Constant     BMI - recorded: 18.97 Nutritional Status: BMI <19  Underweight Nutritional Risks: Unintentional weight loss Diabetes: No  How often do you need to have someone help you when you read instructions, pamphlets, or other written materials from your doctor or pharmacy?: 1 - Never  Interpreter Needed?: No  Information entered by :: Abby Aadhya Bustamante, CMA   Activities of Daily Living    09/26/2022    1:36 PM  In your present state of health, do you have any difficulty performing the following activities:  Hearing? 0  Vision? 1  Difficulty concentrating or making decisions? 0  Walking or climbing stairs? 1  Comment in wheelchair   Dressing or bathing? 0  Doing errands, shopping? 0  Preparing Food and eating ? Y  Comment patient has a hard time preparing food.  Using the Toilet? N  In the past six months, have you accidently leaked urine? N  Managing your Medications? N  Managing your Finances? N  Housekeeping or managing your Housekeeping? Y  Comment patient has a hard time managing his home due to being in a wheelchair    Patient Care Team: [name], NP as PCP - General (Internal Medicine) Jake Bathe, MD as PCP - Cardiology (Cardiology) Glenna Fellows, MD as Consulting Physician (Plastic Surgery) [facility]  Indicate any recent Medical Services you may have received from other than [facility] providers in the past year (date may be approximate).     Assessment:   This is a routine wellness examination for [employer].  Hearing/Vision screen Hearing Screening - Comments:: Patient denies any hearing difficulties.   Vision Screening - Comments:: Patient states he is going blind due to a disease associated with macular degeneration. Referral entered today so that patient can establish care with a new provider   Dietary issues and exercise activities discussed:     Goals Addressed             This Visit's Progress    Patient Stated       Patient wants to begin physical therapy to work on strength training and increasing endurance.        Depression Screen    09/26/2022    1:29 PM 11/24/2021   10:33 AM 04/19/2017    1:41 PM  PHQ 2/9 Scores  PHQ - 2 Score 2  0  PHQ- 9 Score 10    Exception Documentation  Patient refusal     Fall Risk    09/26/2022    1:38 PM 11/24/2021   10:33 AM 11/12/2021    8:40 AM  Fall Risk   Falls in the past year? 1 0 0  Number falls in past yr: 0    Injury with Fall? 0    Risk  for fall due to : Impaired mobility;Impaired vision Impaired balance/gait;Impaired mobility   Follow up Falls prevention discussed;Education provided       MEDICARE RISK AT HOME:  Medicare Risk at Home - 09/26/22 1337     Any stairs in or around the home? Yes    If so, are there any without handrails? No    Home free of loose throw rugs in walkways, pet beds, electrical cords, etc? Yes    Adequate lighting in your home to reduce risk of falls? Yes    Life alert? No    Use of a cane, walker or w/c? Yes    Shower chair or bench in shower? Yes    Elevated toilet seat or a handicapped toilet? Yes   has a bar to help him transfer from his wheelchair to the toilet            TIMED UP AND GO:  Was the test performed? No    Cognitive Function:        09/26/2022    1:39 PM  6CIT Screen  What Year? 0 points  What month? 0 points  What time? 0 points  Count back from 20 0 points  Months in reverse 0 points  Repeat phrase 0 points  Total Score 0 points    Immunizations Immunization History  Administered Date(s) Administered   Hepatitis B, ADULT 06/21/2012, 12/08/2012, 04/23/2013, 07/01/2017   Pneumococcal Conjugate-13 04/08/2014   Pneumococcal Polysaccharide-23 06/04/2014, 01/09/2018    TDAP status: Due, Education has been provided regarding the importance of this vaccine. Advised may receive this vaccine at local pharmacy or Health Dept. Aware to provide a copy of the vaccination record if obtained from local pharmacy or Health Dept. Verbalized acceptance and understanding.  Flu Vaccine status: Up to date  Pneumococcal vaccine status: NOT AGE APPROPRIATE FOR THIS PATIENT   Covid-19 vaccine status: Information provided on how to obtain vaccines.   Qualifies for Shingles Vaccine? Yes   Zostavax completed No   Shingrix Completed?: No.    Education has been provided regarding the importance of this vaccine. Patient has been advised to call insurance company to determine out of pocket expense if they have not yet received this vaccine. Advised may also receive vaccine at local pharmacy or Health Dept. Verbalized acceptance  and understanding.  Screening Tests Health Maintenance  Topic Date Due   COVID-19 Vaccine (1) Never done   DTaP/Tdap/Td (1 - Tdap) Never done   Colonoscopy  Never done   Zoster Vaccines- Shingrix (1 of 2) Never done   Hepatitis C Screening  11/13/2022 (Originally 07/15/1988)   INFLUENZA VACCINE  11/03/2022   Medicare Annual Wellness (AWV)  09/26/2023   HIV Screening  Completed   HPV VACCINES  Aged Out    Health Maintenance  Health Maintenance Due  Topic Date Due   COVID-19 Vaccine (1) Never done   DTaP/Tdap/Td (1 - Tdap) Never done   Colonoscopy  Never done   Zoster Vaccines- Shingrix (1 of 2) Never done    Colorectal cancer screening: Referral to GI placed 09/26/22. Pt aware the office will call re: appt.  Lung Cancer Screening: (Low Dose CT Chest recommended if Age 55-80 years, 20 pack-year currently smoking OR have quit w/in 15years.) does not qualify.   Lung Cancer Screening Referral: n/a  Additional Screening:  Hepatitis C Screening: does qualify; postponed  Vision Screening: Recommended annual ophthalmology exams for early detection of glaucoma and other disorders of the eye. Is the  patient up to date with their annual eye exam?  No  Who is the provider or what is the name of the office in which the patient attends annual eye exams? Referral placed today If pt is not established with a provider, would they like to be referred to a provider to establish care? Yes .   Dental Screening: Recommended annual dental exams for proper oral hygiene  Diabetic Foot Exam: n/a  Community Resource Referral / Chronic Care Management: CRR required this visit?  No   CCM required this visit?  No    Plan:     I have personally reviewed and noted the following in the patient's chart:   Medical and social history Use of alcohol, tobacco or illicit drugs  Current medications and supplements including opioid prescriptions. Patient is not currently taking opioid  prescriptions. Functional ability and status Nutritional status Physical activity Advanced directives List of other physicians Hospitalizations, surgeries, and ER visits in previous 12 months Vitals Screenings to include cognitive, depression, and falls Referrals and appointments  In addition, I have reviewed and discussed with patient certain preventive protocols, quality metrics, and best practice recommendations. A written personalized care plan for preventive services as well as general preventive health recommendations were provided to patient.   Because this visit was a virtual/telehealth visit,  certain criteria was not obtained, such a blood pressure, CBG if patient is a diabetic, and timed up and go.    Jordan Hawks Arita Severtson, CMA   09/26/2022   After Visit Summary: (Mail) Due to this being a telephonic visit, the after visit summary with patients personalized plan was offered to patient via mail   Nurse Notes: Patient is request [facility] physical therapy due to recent unexplained weight loss and physical deconditioning. Appt scheduled for him to see provider on 10/12/22. He can not come in on Tuesday or Thursdays because of dialysis.

## 2022-10-03 ENCOUNTER — Ambulatory Visit: Payer: 59

## 2022-10-12 ENCOUNTER — Encounter (INDEPENDENT_AMBULATORY_CARE_PROVIDER_SITE_OTHER): Payer: Self-pay | Admitting: Primary Care

## 2022-10-12 ENCOUNTER — Ambulatory Visit (INDEPENDENT_AMBULATORY_CARE_PROVIDER_SITE_OTHER): Payer: 59 | Admitting: Primary Care

## 2022-10-12 VITALS — BP 159/82 | HR 68 | Resp 16

## 2022-10-12 DIAGNOSIS — N186 End stage renal disease: Secondary | ICD-10-CM | POA: Diagnosis not present

## 2022-10-12 DIAGNOSIS — R5381 Other malaise: Secondary | ICD-10-CM | POA: Diagnosis not present

## 2022-10-12 DIAGNOSIS — F4323 Adjustment disorder with mixed anxiety and depressed mood: Secondary | ICD-10-CM | POA: Diagnosis not present

## 2022-10-12 DIAGNOSIS — R630 Anorexia: Secondary | ICD-10-CM

## 2022-10-12 DIAGNOSIS — Z992 Dependence on renal dialysis: Secondary | ICD-10-CM

## 2022-10-12 NOTE — Progress Notes (Signed)
   Acute Office Visit  Subjective:     Patient ID: Corey [name], male    DOB: 1970-05-01, 52 y.o.   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 He voices concerns with weight loss, fatigue, pain and left flank area feels like gas builds up.  He is on dialysis and also follow-up by Dr. Donato Schultz cardiology patient is legally blind and was unaware of his lab results read to him today with notified him that he was slightly anemic he his End-stage renal disease.  Hemodialysis.  Alkaline phosphatase 520, chronically elevated.  ALT, AST liver enzymes are normal thyroid functions are normal with amiodarone. No appetite weight loss. Patient has No headache, No chest pain,  No Nausea, No new weakness tingling or numbness, No Cough - shortness of breath   ROS Comprehensive ROS Pertinent positive and negative noted in HPI       Objective:    Blood Pressure (Abnormal) 159/82 (BP Location: Left Arm, Patient Position: Sitting, Cuff Size: Normal)   Pulse 68   Respiration 16   Oxygen Saturation 95%    Physical Exam Vitals reviewed.  Constitutional:      Appearance: He is ill-appearing.  HENT:     Head: Normocephalic.     Right Ear: External ear normal.     Left Ear: External ear normal.     Nose: Nose normal.  Eyes:     Extraocular Movements: Extraocular movements intact.  Cardiovascular:     Rate and Rhythm: Normal rate and regular rhythm.  Pulmonary:     Effort: Pulmonary effort is normal.     Breath sounds: Normal breath sounds.  Abdominal:     General: Bowel sounds are normal.     Palpations: Abdomen is soft.  Musculoskeletal:     Cervical back: Normal range of motion.     Comments: Wheel chair bond  Neurological:     Mental Status: He is alert and oriented to person, place, and time.  Psychiatric:         Mood and Affect: Mood normal.        Behavior: Behavior normal.    No results found for any visits on 10/12/22.      Assessment & Plan:  Corey [name] was seen today for weight loss, fatigue and pain.  Diagnoses and all orders for this visit:  Physical deconditioning -     Ambulatory referral to Physical Therapy  ESRD on dialysis (HCC) Decrease in appetite   Adjustment disorder with mixed anxiety and depressed mood -     Ambulatory referral to Psychiatry     Return if symptoms worsen or fail to improve.  [name], NP

## 2022-10-12 NOTE — Patient Instructions (Signed)
1. Eat small meals more frequently Eating three full meals might seem like a challenging task when you don't have a healthy appetite.  A more motivating way to eat is to divide your three main meals into five or six smaller meals.  As your appetite improves, you can start increasing the portions of these meals or adding more ingredients to consume more calories throughout the day.  For example, if you're eating a tuna sandwich, incorporate some veggies and cheese to add more calories and nutrients.  SUMMARY Have five or six smaller meals per day instead of three large ones. As your appetite improves, you can start increasing portions and adding more ingredients.   2. Eat nutrient-rich foods People with poor appetites tend to eat high calorie foods like candy, chips, ice cream, and baked goods to gain weight.  These kinds of foods might seem more appetizing and contain high amounts of calories, but they are often lacking in other important nutrients, including protein, vitamins, and minerals.  There are plenty of other foods that are high in calories and can provide other nutrients, such as protein and heart-healthy fats.  For example, instead of ice cream for dessert, you can eat 1 cup of plain, full-fat Greek yogurt. Add some berries and cinnamon for sweetness.  Similarly, if you feel like eating pizza, you can make your own and add extra veggies and some protein for extra nutrients.  SUMMARY Decrease your intake of empty calories. Instead, make easy substitutions to focus on more nutritious foods that contain protein, healthy fats, and whole grains.  3. Add more calories to your meals Another way to ensure you're eating enough during the day is to add more calories to your meals.  One way to do this is to cook your foods with calorie-dense ingredients like butter, nut butters, olive oil, or whole milk.  Add 104 calories: Cook your eggs with 1 tablespoon (tbsp), or 14 grams (g), of  butter. Add 149 calories: Try adding 1 cup, or 237 milliliters (mL), of whole milk to your oatmeal instead of water. Add 120-126 calories: Drizzle 1 tbsp (14 g) of olive oil or half an avocado to your salad. Add 204 calories: Spread 2 tbsp (32 g) of peanut butter on apple slices for a snack. Simple additions like these can pack more healthy calories into your meals and increase your total calorie intake.  SUMMARY Add calorie-rich ingredients as you're preparing your meals to consume more calories throughout the day   4. Make mealtime an enjoyable social activity Cooking and eating a meal with others may help stimulate your appetite more than eating alone.  To make foods more pleasant to eat, you can invite friends and family over for meals. If they cannot come over to keep you company, try eating while watching TV. These strategies can help by turning your attention away from the food.  Turning meals into occasions for socialization and entertainment may help you enjoy your food more and could increase your appetite.  SUMMARY Having meals with friends and family or eating them in front of the TV can distract you from the food you are eating and lead you to eat more.  5. Schedule meal times Hunger usually cues people to eat. However, if you do not get hungry, you might not be able to rely on your appetite to remind you when to eat.  If this is the case, try scheduling your meals and setting a reminder at each mealtime to make  sure you're eating regularly.  Additionally, having a regular meal schedule is important for helping you consume enough calories and nutrients every day.  SUMMARY Scheduling and setting reminders for meals can help you keep track of your food intake.  6. Don't skip breakfast Consuming breakfast on a daily basis is important when you want to increase your appetite and gain weight. skipping meals could reduce overall daily calorie intake and decrease diet quality    If you're trying to eat more, having breakfast every day is as important as having regular meals throughout the day.  SUMMARY Eating breakfast every day could promote your appetite and increase your overall daily calorie intake and diet quality.  7. Eat less fiber High fiber diets have been proven to promote feelings of fullness and decrease calorie intake for those looking to lose weight Even though high fiber foods are recommended in a balanced diet, they can slow down digestion and keep you feeling full for longer. Thus, you may want to moderate your intake if you're looking to increase your appetite.  Eating a low fiber diet could prevent you from feeling too full, and might help you eat more during the day.  However, keep in mind that this should only be used as a temporary strategy for increasing appetite, as following a low fiber diet long-term may have negative effects on health .If your appetite doesn't improve after a few days, consult a doctor to determine the best course of treatment.  SUMMARY Decreasing the amount of fiber in your diet could reduce feelings of fullness and make you eat more food during the day 8. Drink your calories Drinking your calories might be a more motivating way to increase your calorie intake than having to chew your food when you do not feel too hungry.  A practical way to drink your calories is by replacing some of your meals with nutritious, high calorie drinks.  Smoothies, milkshakes, and juices can all be good meal replacement drinks. Try making them using nourishing ingredients like fruits and vegetables.  You can also add good sources of protein like whole milk, yogurt, or protein powder for extra calories and nutrients.  SUMMARY Drinking your calories and nutrients instead of eating them could help motivate you to consume your food.  9. Incorporate healthy snacks Eating big meals can be intimidating, whereas small and easy-to-eat snacks can  be more convenient and take less effort to increase your food intake.  Snacks can also be helpful while you are on the go.  However, snacks are not meant to replace your big meals but rather to complement them. So avoid eating snacks close to mealtime because it could affect your appetite.  Here are some examples of healthy snacks:  fruits like bananas, apples, and oranges protein bars or granola bars [place] yogurt or cottage cheese and fruit nut butter and crackers salty snacks like popcorn or trail mix SUMMARY Eat more of the foods you like. This will help motivate you to eat and stimulate your appetite.  10. Eat more of your favorite foods The logic for this rule is pretty simple -- choose foods you like.  When you sit down in front of a meal you know you will enjoy, you're probably much more inclined to eat it than a dish you find unappealing. To ensure you consume more of those foods, it is important you take some time to plan and prepare them ahead of time so you can always have them available.  SUMMARY Eat more of the foods you like. This will help motivate you to eat and stimulate your appetite.  11. Limit beverages with meals Drinking fluids prior to or during your meals could affect your appetite negatively and make you eat lessIn fact, some research suggests that consuming water prior to meals can reduce calorie intake and help with weight loss ([employer]). Try to limit your water intake 30 minutes before meals and see if your appetite improves. SUMMARY Drinking water or other liquids before or during meals can affect your appetite and make you eat less. 12. Some supplements could help A deficiency of certain vitamins and minerals could be reducing your appetite. Check with nephrologist A few supplements that have been shown to help include:  Zinc: A lack of zinc in the diet can lead to appetite loss and taste disturbances that could promote a low desire to eat  Thiamine: A thiamine deficiency could cause decreased appetite and increased resting energy expenditure, leading to weight loss  Fish oil: Some studies have shown that this supplement may increase appetite and reduce feelings of fullness after meals  SUMMARY Some vitamin and mineral deficiencies can produce a lack of appetite. Taking certain supplements may give you an appetite boost.  13. Keep a food diary Keeping a food diary can help you track your meals and ensure you are consuming enough calories throughout the day.  Recording your food intake and hunger levels can also help you understand how your appetite is progressing.  Aim to record every meal and snack, no matter how small it is. When your appetite is poor, every calorie counts toward your daily goal.  SUMMARY Keeping a food diary can help you track your food intake and promote better eating habits.  The bottom line Many factors can affect your appetite, including physical conditions, mental conditions, medications, and vitamin or mineral deficiencies.  However, small changes can make a big difference, including making changes to your diet and adjusting your mealtimes.  If you have a hard time eating, it's always a good idea to check with a doctor, who can advise you about boosting your hunger and gaining weight.

## 2022-10-14 ENCOUNTER — Telehealth: Payer: Self-pay | Admitting: Cardiology

## 2022-10-14 NOTE — Telephone Encounter (Signed)
Pt would like a callback to discuss test recent test results. Please advise

## 2022-10-14 NOTE — Telephone Encounter (Signed)
Patient states his PCP was reviewing labs in his chart and noted elevated LFTs, he states his PCP advised him to contact our office about this.  Reviewed most recent CMP from 08/31/22, Dr. Anne Fu notes: Hemoglobin 11.4.  Minimally anemic.  End-stage renal disease.  Hemodialysis.  Alkaline phosphatase 520, chronically elevated.  ALT, AST liver enzymes are normal thyroid functions are normal with amiodarone.  Reassuring lab work.   Informed patient we are a cardiology office and treat heart conditions. Elevated liver enzymes are typically managed by PCP and may require a referral to GI. Patient states he believes he was already referred to GI--confirmed by looking at referrals on appt page, however patient has not yet received a call to schedule an appt.  Advised patient to contact his PCP office to let them know he has not received a call for an appt with GI and to see what next steps he needs to take to follow-up on this.  Patient verbalized understanding and expressed appreciation for call.

## 2022-10-17 ENCOUNTER — Ambulatory Visit: Payer: 59 | Attending: Cardiology

## 2022-10-17 ENCOUNTER — Telehealth: Payer: Self-pay

## 2022-10-17 NOTE — Telephone Encounter (Signed)
Pt missed [facility] appt. Called to reschedule, no answer. Left message on voicemail.  

## 2022-10-27 ENCOUNTER — Inpatient Hospital Stay (HOSPITAL_COMMUNITY)
Admission: EM | Admit: 2022-10-27 | Discharge: 2022-11-29 | DRG: 239 | Disposition: A | Discharge status: Rehabilitation Facility | Payer: 59 | Attending: Internal Medicine | Admitting: Internal Medicine

## 2022-10-27 ENCOUNTER — Emergency Department (HOSPITAL_COMMUNITY): Payer: 59

## 2022-10-27 ENCOUNTER — Other Ambulatory Visit: Payer: Self-pay

## 2022-10-27 ENCOUNTER — Encounter (HOSPITAL_COMMUNITY): Payer: Self-pay | Admitting: Emergency Medicine

## 2022-10-27 DIAGNOSIS — Z681 Body mass index (BMI) 19 or less, adult: Secondary | ICD-10-CM | POA: Diagnosis not present

## 2022-10-27 DIAGNOSIS — Z7401 Bed confinement status: Secondary | ICD-10-CM

## 2022-10-27 DIAGNOSIS — J9601 Acute respiratory failure with hypoxia: Secondary | ICD-10-CM | POA: Diagnosis not present

## 2022-10-27 DIAGNOSIS — I10 Essential (primary) hypertension: Secondary | ICD-10-CM | POA: Diagnosis not present

## 2022-10-27 DIAGNOSIS — K921 Melena: Secondary | ICD-10-CM | POA: Diagnosis not present

## 2022-10-27 DIAGNOSIS — Z79899 Other long term (current) drug therapy: Secondary | ICD-10-CM | POA: Diagnosis not present

## 2022-10-27 DIAGNOSIS — Z881 Allergy status to other antibiotic agents status: Secondary | ICD-10-CM

## 2022-10-27 DIAGNOSIS — K592 Neurogenic bowel, not elsewhere classified: Secondary | ICD-10-CM | POA: Diagnosis not present

## 2022-10-27 DIAGNOSIS — I872 Venous insufficiency (chronic) (peripheral): Secondary | ICD-10-CM | POA: Diagnosis present

## 2022-10-27 DIAGNOSIS — I5032 Chronic diastolic (congestive) heart failure: Secondary | ICD-10-CM | POA: Diagnosis present

## 2022-10-27 DIAGNOSIS — Z597 Insufficient social insurance and welfare support: Secondary | ICD-10-CM

## 2022-10-27 DIAGNOSIS — I70262 Atherosclerosis of native arteries of extremities with gangrene, left leg: Secondary | ICD-10-CM | POA: Diagnosis present

## 2022-10-27 DIAGNOSIS — I5043 Acute on chronic combined systolic (congestive) and diastolic (congestive) heart failure: Secondary | ICD-10-CM | POA: Diagnosis present

## 2022-10-27 DIAGNOSIS — Z7189 Other specified counseling: Secondary | ICD-10-CM | POA: Diagnosis not present

## 2022-10-27 DIAGNOSIS — M24562 Contracture, left knee: Secondary | ICD-10-CM | POA: Diagnosis present

## 2022-10-27 DIAGNOSIS — M19011 Primary osteoarthritis, right shoulder: Secondary | ICD-10-CM | POA: Diagnosis present

## 2022-10-27 DIAGNOSIS — H548 Legal blindness, as defined in USA: Secondary | ICD-10-CM | POA: Diagnosis present

## 2022-10-27 DIAGNOSIS — I714 Abdominal aortic aneurysm, without rupture, unspecified: Secondary | ICD-10-CM | POA: Diagnosis not present

## 2022-10-27 DIAGNOSIS — Z89611 Acquired absence of right leg above knee: Secondary | ICD-10-CM | POA: Diagnosis not present

## 2022-10-27 DIAGNOSIS — I482 Chronic atrial fibrillation, unspecified: Secondary | ICD-10-CM | POA: Diagnosis present

## 2022-10-27 DIAGNOSIS — M24561 Contracture, right knee: Secondary | ICD-10-CM | POA: Diagnosis present

## 2022-10-27 DIAGNOSIS — Z4682 Encounter for fitting and adjustment of non-vascular catheter: Secondary | ICD-10-CM

## 2022-10-27 DIAGNOSIS — K5900 Constipation, unspecified: Secondary | ICD-10-CM

## 2022-10-27 DIAGNOSIS — Z4781 Encounter for orthopedic aftercare following surgical amputation: Secondary | ICD-10-CM | POA: Diagnosis not present

## 2022-10-27 DIAGNOSIS — N2581 Secondary hyperparathyroidism of renal origin: Secondary | ICD-10-CM | POA: Diagnosis present

## 2022-10-27 DIAGNOSIS — N186 End stage renal disease: Secondary | ICD-10-CM

## 2022-10-27 DIAGNOSIS — G8220 Paraplegia, unspecified: Secondary | ICD-10-CM | POA: Diagnosis present

## 2022-10-27 DIAGNOSIS — G629 Polyneuropathy, unspecified: Secondary | ICD-10-CM | POA: Diagnosis present

## 2022-10-27 DIAGNOSIS — J189 Pneumonia, unspecified organism: Secondary | ICD-10-CM | POA: Insufficient documentation

## 2022-10-27 DIAGNOSIS — Z8249 Family history of ischemic heart disease and other diseases of the circulatory system: Secondary | ICD-10-CM

## 2022-10-27 DIAGNOSIS — T8754 Necrosis of amputation stump, left lower extremity: Secondary | ICD-10-CM | POA: Diagnosis not present

## 2022-10-27 DIAGNOSIS — J181 Lobar pneumonia, unspecified organism: Secondary | ICD-10-CM | POA: Diagnosis present

## 2022-10-27 DIAGNOSIS — I7090 Unspecified atherosclerosis: Secondary | ICD-10-CM | POA: Diagnosis not present

## 2022-10-27 DIAGNOSIS — G4700 Insomnia, unspecified: Secondary | ICD-10-CM | POA: Diagnosis present

## 2022-10-27 DIAGNOSIS — H3553 Other dystrophies primarily involving the sensory retina: Secondary | ICD-10-CM | POA: Diagnosis present

## 2022-10-27 DIAGNOSIS — I998 Other disorder of circulatory system: Secondary | ICD-10-CM | POA: Diagnosis not present

## 2022-10-27 DIAGNOSIS — Z809 Family history of malignant neoplasm, unspecified: Secondary | ICD-10-CM

## 2022-10-27 DIAGNOSIS — Z89612 Acquired absence of left leg above knee: Secondary | ICD-10-CM | POA: Diagnosis not present

## 2022-10-27 DIAGNOSIS — Z8673 Personal history of transient ischemic attack (TIA), and cerebral infarction without residual deficits: Secondary | ICD-10-CM

## 2022-10-27 DIAGNOSIS — Z515 Encounter for palliative care: Secondary | ICD-10-CM | POA: Diagnosis not present

## 2022-10-27 DIAGNOSIS — Z95828 Presence of other vascular implants and grafts: Secondary | ICD-10-CM

## 2022-10-27 DIAGNOSIS — M19012 Primary osteoarthritis, left shoulder: Secondary | ICD-10-CM | POA: Diagnosis present

## 2022-10-27 DIAGNOSIS — G546 Phantom limb syndrome with pain: Secondary | ICD-10-CM | POA: Diagnosis present

## 2022-10-27 DIAGNOSIS — I132 Hypertensive heart and chronic kidney disease with heart failure and with stage 5 chronic kidney disease, or end stage renal disease: Principal | ICD-10-CM | POA: Diagnosis present

## 2022-10-27 DIAGNOSIS — R1032 Left lower quadrant pain: Secondary | ICD-10-CM | POA: Diagnosis present

## 2022-10-27 DIAGNOSIS — G548 Other nerve root and plexus disorders: Secondary | ICD-10-CM | POA: Diagnosis not present

## 2022-10-27 DIAGNOSIS — Z01818 Encounter for other preprocedural examination: Secondary | ICD-10-CM | POA: Diagnosis not present

## 2022-10-27 DIAGNOSIS — G834 Cauda equina syndrome: Secondary | ICD-10-CM | POA: Diagnosis present

## 2022-10-27 DIAGNOSIS — I480 Paroxysmal atrial fibrillation: Secondary | ICD-10-CM | POA: Diagnosis present

## 2022-10-27 DIAGNOSIS — E875 Hyperkalemia: Secondary | ICD-10-CM | POA: Diagnosis present

## 2022-10-27 DIAGNOSIS — Z8349 Family history of other endocrine, nutritional and metabolic diseases: Secondary | ICD-10-CM

## 2022-10-27 DIAGNOSIS — J90 Pleural effusion, not elsewhere classified: Secondary | ICD-10-CM | POA: Diagnosis not present

## 2022-10-27 DIAGNOSIS — Z888 Allergy status to other drugs, medicaments and biological substances status: Secondary | ICD-10-CM

## 2022-10-27 DIAGNOSIS — M19042 Primary osteoarthritis, left hand: Secondary | ICD-10-CM | POA: Diagnosis present

## 2022-10-27 DIAGNOSIS — R5381 Other malaise: Secondary | ICD-10-CM | POA: Diagnosis not present

## 2022-10-27 DIAGNOSIS — M898X9 Other specified disorders of bone, unspecified site: Secondary | ICD-10-CM | POA: Diagnosis present

## 2022-10-27 DIAGNOSIS — I878 Other specified disorders of veins: Secondary | ICD-10-CM | POA: Diagnosis present

## 2022-10-27 DIAGNOSIS — Z992 Dependence on renal dialysis: Secondary | ICD-10-CM

## 2022-10-27 DIAGNOSIS — M19041 Primary osteoarthritis, right hand: Secondary | ICD-10-CM | POA: Diagnosis present

## 2022-10-27 DIAGNOSIS — Z91199 Patient's noncompliance with other medical treatment and regimen due to unspecified reason: Secondary | ICD-10-CM

## 2022-10-27 DIAGNOSIS — Z7901 Long term (current) use of anticoagulants: Secondary | ICD-10-CM

## 2022-10-27 DIAGNOSIS — I71012 Dissection of descending thoracic aorta: Secondary | ICD-10-CM | POA: Diagnosis present

## 2022-10-27 DIAGNOSIS — G6289 Other specified polyneuropathies: Secondary | ICD-10-CM | POA: Diagnosis not present

## 2022-10-27 DIAGNOSIS — Z5986 Financial insecurity: Secondary | ICD-10-CM

## 2022-10-27 DIAGNOSIS — Z823 Family history of stroke: Secondary | ICD-10-CM

## 2022-10-27 DIAGNOSIS — J918 Pleural effusion in other conditions classified elsewhere: Secondary | ICD-10-CM | POA: Diagnosis not present

## 2022-10-27 DIAGNOSIS — L97429 Non-pressure chronic ulcer of left heel and midfoot with unspecified severity: Secondary | ICD-10-CM | POA: Diagnosis present

## 2022-10-27 DIAGNOSIS — M1990 Unspecified osteoarthritis, unspecified site: Secondary | ICD-10-CM | POA: Diagnosis not present

## 2022-10-27 DIAGNOSIS — R52 Pain, unspecified: Secondary | ICD-10-CM | POA: Diagnosis not present

## 2022-10-27 DIAGNOSIS — Z91158 Patient's noncompliance with renal dialysis for other reason: Secondary | ICD-10-CM

## 2022-10-27 DIAGNOSIS — D631 Anemia in chronic kidney disease: Secondary | ICD-10-CM | POA: Diagnosis present

## 2022-10-27 DIAGNOSIS — I739 Peripheral vascular disease, unspecified: Secondary | ICD-10-CM | POA: Diagnosis not present

## 2022-10-27 DIAGNOSIS — E43 Unspecified severe protein-calorie malnutrition: Secondary | ICD-10-CM | POA: Insufficient documentation

## 2022-10-27 DIAGNOSIS — I96 Gangrene, not elsewhere classified: Secondary | ICD-10-CM | POA: Insufficient documentation

## 2022-10-27 DIAGNOSIS — Z993 Dependence on wheelchair: Secondary | ICD-10-CM

## 2022-10-27 DIAGNOSIS — I7143 Infrarenal abdominal aortic aneurysm, without rupture: Secondary | ICD-10-CM

## 2022-10-27 DIAGNOSIS — I951 Orthostatic hypotension: Secondary | ICD-10-CM | POA: Diagnosis present

## 2022-10-27 DIAGNOSIS — Z1152 Encounter for screening for COVID-19: Secondary | ICD-10-CM | POA: Diagnosis not present

## 2022-10-27 LAB — COMPREHENSIVE METABOLIC PANEL
ALT: 7 U/L (ref 0–44)
AST: 6 U/L — ABNORMAL LOW (ref 15–41)
Albumin: 2.4 g/dL — ABNORMAL LOW (ref 3.5–5.0)
Alkaline Phosphatase: 259 U/L — ABNORMAL HIGH (ref 38–126)
Anion gap: 18 — ABNORMAL HIGH (ref 5–15)
BUN: 103 mg/dL — ABNORMAL HIGH (ref 6–20)
CO2: 27 mmol/L (ref 22–32)
Calcium: 9.4 mg/dL (ref 8.9–10.3)
Chloride: 97 mmol/L — ABNORMAL LOW (ref 98–111)
Creatinine, Ser: 13.68 mg/dL — ABNORMAL HIGH (ref 0.61–1.24)
GFR, Estimated: 4 mL/min — ABNORMAL LOW (ref >60)
Glucose, Bld: 95 mg/dL (ref 70–99)
Potassium: 6 mmol/L — ABNORMAL HIGH (ref 3.5–5.1)
Sodium: 142 mmol/L (ref 135–145)
Total Bilirubin: 0.7 mg/dL (ref 0.3–1.2)
Total Protein: 6.3 g/dL — ABNORMAL LOW (ref 6.5–8.1)

## 2022-10-27 LAB — CBC
HCT: 33.9 % — ABNORMAL LOW (ref 39.0–52.0)
Hemoglobin: 10.6 g/dL — ABNORMAL LOW (ref 13.0–17.0)
MCH: 29.9 pg (ref 26.0–34.0)
MCHC: 31.3 g/dL (ref 30.0–36.0)
MCV: 95.8 fL (ref 80.0–100.0)
Platelets: 209 10*3/uL (ref 150–400)
RBC: 3.54 MIL/uL — ABNORMAL LOW (ref 4.22–5.81)
RDW: 14.6 % (ref 11.5–15.5)
WBC: 11.5 10*3/uL — ABNORMAL HIGH (ref 4.0–10.5)
nRBC: 0 % (ref 0.0–0.2)

## 2022-10-27 LAB — I-STAT CHEM 8, ED
BUN: 96 mg/dL — ABNORMAL HIGH (ref 6–20)
Calcium, Ion: 1.1 mmol/L — ABNORMAL LOW (ref 1.15–1.40)
Chloride: 101 mmol/L (ref 98–111)
Creatinine, Ser: 13.6 mg/dL — ABNORMAL HIGH (ref 0.61–1.24)
Glucose, Bld: 89 mg/dL (ref 70–99)
HCT: 32 % — ABNORMAL LOW (ref 39.0–52.0)
Hemoglobin: 10.9 g/dL — ABNORMAL LOW (ref 13.0–17.0)
Potassium: 5.8 mmol/L — ABNORMAL HIGH (ref 3.5–5.1)
Sodium: 139 mmol/L (ref 135–145)
TCO2: 27 mmol/L (ref 22–32)

## 2022-10-27 LAB — TROPONIN I (HIGH SENSITIVITY)
Troponin I (High Sensitivity): 47 ng/L — ABNORMAL HIGH (ref <18)
Troponin I (High Sensitivity): 51 ng/L — ABNORMAL HIGH (ref <18)

## 2022-10-27 LAB — LIPASE, BLOOD: Lipase: 33 U/L (ref 11–51)

## 2022-10-27 LAB — HEPATITIS B SURFACE ANTIGEN: Hepatitis B Surface Ag: NONREACTIVE

## 2022-10-27 MED ORDER — ONDANSETRON HCL 4 MG/2ML IJ SOLN
4.0000 mg | Freq: Once | INTRAMUSCULAR | Status: AC
  Administered 2022-10-27: 4 mg via INTRAVENOUS
  Filled 2022-10-27: qty 2

## 2022-10-27 MED ORDER — ONDANSETRON HCL 4 MG PO TABS: 4.0000 mg | ORAL_TABLET | Freq: Four times a day (QID) | ORAL | Status: DC | PRN

## 2022-10-27 MED ORDER — MORPHINE SULFATE (PF) 4 MG/ML IV SOLN
4.0000 mg | Freq: Once | INTRAVENOUS | Status: AC
  Administered 2022-10-27: 4 mg via INTRAVENOUS
  Filled 2022-10-27: qty 1

## 2022-10-27 MED ORDER — SEVELAMER CARBONATE 2.4 G PO PACK
4.8000 g | PACK | Freq: Three times a day (TID) | ORAL | Status: DC
  Administered 2022-10-28 – 2022-11-17 (×35): 4.8 g via ORAL
  Filled 2022-10-27 (×61): qty 2

## 2022-10-27 MED ORDER — IOHEXOL 350 MG/ML SOLN
75.0000 mL | Freq: Once | INTRAVENOUS | Status: AC | PRN
  Administered 2022-10-27: 75 mL via INTRAVENOUS

## 2022-10-27 MED ORDER — INSULIN ASPART 100 UNIT/ML IV SOLN
5.0000 [IU] | Freq: Once | INTRAVENOUS | Status: AC
  Administered 2022-10-27: 5 [IU] via INTRAVENOUS

## 2022-10-27 MED ORDER — ANTICOAGULANT SODIUM CITRATE 4% (200MG/5ML) IV SOLN
5.0000 mL | Freq: Once | Status: AC
  Administered 2022-10-29: 3.8 mL
  Filled 2022-10-27 (×2): qty 5

## 2022-10-27 MED ORDER — ONDANSETRON HCL 4 MG/2ML IJ SOLN
4.0000 mg | Freq: Four times a day (QID) | INTRAMUSCULAR | Status: DC | PRN
  Administered 2022-11-01 – 2022-11-11 (×2): 4 mg via INTRAVENOUS
  Filled 2022-10-27 (×2): qty 2

## 2022-10-27 MED ORDER — CINACALCET HCL 30 MG PO TABS: 120.0000 mg | ORAL_TABLET | Freq: Every evening | ORAL | Status: DC

## 2022-10-27 MED ORDER — ASPIRIN 81 MG PO CHEW
324.0000 mg | CHEWABLE_TABLET | Freq: Once | ORAL | Status: AC
  Administered 2022-10-27: 324 mg via ORAL
  Filled 2022-10-27: qty 4

## 2022-10-27 MED ORDER — AMIODARONE HCL 200 MG PO TABS
200.0000 mg | ORAL_TABLET | Freq: Every day | ORAL | Status: DC
  Administered 2022-10-28 – 2022-11-29 (×32): 200 mg via ORAL
  Filled 2022-10-27 (×32): qty 1

## 2022-10-27 MED ORDER — DEXTROSE 50 % IV SOLN
1.0000 | Freq: Once | INTRAVENOUS | Status: AC
  Administered 2022-10-27: 50 mL via INTRAVENOUS
  Filled 2022-10-27: qty 50

## 2022-10-27 NOTE — ED Provider Notes (Signed)
52 yo male presenting with left shoulder and flank pain, hx of aortic dissection Hx of dialysis, EDP Knapp spoke to Dr Arlean Hopping already from nephrology regarding inpatient dialysis, pending completion of ED emergent workup  Missed dialysis this week due to feeling ill/ vomiting & diarrhea  K 5.8. Pending CT dissection study   Pt on 2L [place], new oxygen requirement  Plan for admission after ED workup, inpatient dialysis   Physical Exam  BP 124/68   Pulse 68   Temp 97.9 F (36.6 C) (Oral)   Resp 20   Ht 6\' 3"  (1.905 m)   Wt 62.9 kg   SpO2 92%   BMI 17.33 kg/m   Physical Exam  Procedures  Procedures  ED Course / MDM   Clinical Course as of 10/28/22 0013  Thu Oct 27, 2022  1514 Metabolic panel shows elevated BUN and creatinine.  Troponin elevated [JK]  1520 Will treat hyperkalemia with insulin and glucose.  With his persistent abdominal pain and history of prior to dissection we will proceed with CT scans chest abdomen pelvis [JK]  1528 Case discussed with Dr Arta Silence.  Will plan on dialysis for patient [JK]  1848 Pt taken for dialysis - noted to have large left pleural effusion on CT and stable imaging otherwise - will page for admission, discuss with inpatient service [MT]    Clinical Course User Index [JK] Linwood Dibbles, MD [MT] Renaye Rakers Kermit Balo, MD   Medical Decision Making Amount and/or Complexity of Data Reviewed Labs: ordered. Radiology: ordered.  Risk OTC drugs. Prescription drug management. Decision regarding hospitalization.   Patient admitted to Dr Mikeal Hawthorne hospitalist - still at dialysis       Terald Sleeper, MD 10/28/22 [phone]

## 2022-10-27 NOTE — Consult Note (Signed)
[name] Note  Indication for Consultation:  Management of ESRD/hemodialysis; anemia, hypertension/volume and secondary hyperparathyroidism  HPI: Corey [name] is a 52 y.o. male with ESRD due to HTN and aortic dissection on HD since 2004 in [place]. Transferred to [facility] on 10/13/15 with  Med HO as below  with AAA seen last by [facility] Dr Karin Lieu 12/10/20  with stable AAA  for 1 yr follow up but pt missed apt.  Ho some noncompliance at OP [facility].   Now reports to ER co Intermittent  Abd pain / L sholuder/ L flank pain since past weekend . Also diarreha , sweats, chills, and body weakness / some sob today .Corey [name] He missed op HD txs  today and  7/23  tues , sat , 7/20 , and last tuesday  reports 2/2 to symptoms.  In ER  Na  142, K 6.0 , glu 95, Cr 13.7  bun 103  , WBC 11.5  hgb 10.6 , Lipase 33 trop 47 CXR = pulmonary interstitial edema./ Opacities within the mid and lower left lung,= atelec  and/or airspace consolidation. Left pleural effusion.     CT  chest  = Pend       Past Medical History:  Diagnosis Date   Arthritis    hands and shoulders   Blindness and low vision    "Stargardt disease"   Dissection of aorta (HCC) 2004   a. s/p extensive repeair in 2004 in [place] complicated by ESRD, lower extremity paralysis, coma, and extended hospitalization of 2 years   ESRD (end stage renal disease) (HCC)    a. TTS   Headache    History of cardioversion 2014   Hypertension    Neuropathy    Non-healing non-surgical wound 03/2016   PAF (paroxysmal atrial fibrillation) (HCC)    a. s/p DCCV in 2014; b. on Coumadin; c. CHADS2VASc => 2 (HTN, vascular disease)   Paralysis (HCC)    due to dissection of aorta in 2004, lower extremities   Pneumonia     Past Surgical History:  Procedure Laterality Date   APPLICATION OF WOUND VAC Left 04/13/2016   Procedure: APPLICATION OF WOUND VAC;  Surgeon: Fransisco Hertz, MD;  Location: [facility] OR;  Service: Vascular;  Laterality: Left;   APPLICATION OF  WOUND VAC Left 04/18/2016   Procedure: APPLICATION OF WOUND VAC;  Surgeon: Maeola Harman, MD;  Location: [facility] OR;  Service: Vascular;  Laterality: Left;  Wound vac change    APPLICATION OF WOUND VAC Left 04/20/2016   Procedure: WOUND VAC CHANGE;  Surgeon: Fransisco Hertz, MD;  Location: [facility] OR;  Service: Vascular;  Laterality: Left;   AV FISTULA PLACEMENT     BASCILIC VEIN TRANSPOSITION Left 12/09/2015   Procedure: FIRST STAGE BASILIC VEIN TRANSPOSITION LEFT UPPER ARM;  Surgeon: Fransisco Hertz, MD;  Location: [facility] OR;  Service: Vascular;  Laterality: Left;   BASCILIC VEIN TRANSPOSITION Left 03/09/2016   Procedure: SECOND STAGE BASILIC VEIN TRANSPOSITION WITH REVISION OF ANASTOMOSIS LEFT UPPER ARM;  Surgeon: Fransisco Hertz, MD;  Location: [facility] OR;  Service: Vascular;  Laterality: Left;   CARDIOVERSION     CARDIOVERSION N/A 01/04/2019   Procedure: CARDIOVERSION;  Surgeon: Lewayne Bunting, MD;  Location: [facility] ENDOSCOPY;  Service: Cardiovascular;  Laterality: N/A;   REPAIR OF ACUTE ASCENDING THORACIC AORTIC DISSECTION     REVISON OF ARTERIOVENOUS FISTULA Left 04/20/2016   Procedure: LIGATION OF BASILIC VEIN TRANSPOSITION;  Surgeon: Fransisco Hertz, MD;  Location:  [facility] OR;  Service: Vascular;  Laterality: Left;   WOUND DEBRIDEMENT Left 04/13/2016   Procedure: DEBRIDEMENT WOUND;  Surgeon: Fransisco Hertz, MD;  Location[facility] OR;  Service: Vascular;  Laterality: Left;      Family History  Problem Relation Age of Onset   Cancer Mother    Hypertension Mother    Cancer Father    Hypertension Father    Thyroid disease Sister    Stroke Brother        11      reports that he has never smoked. He has never used smokeless tobacco. He reports that he does not drink alcohol and does not use drugs.   Allergies  Allergen Reactions   Ciprofloxacin Other (See Comments)    Aortic dissection   Heparin Other (See Comments)    UNSPECIFIED REACTION :  On Coumadin since 2004   HIT panel negative 01/19/17   Doxercalciferol  Other (See Comments)   Quinolones Other (See Comments)    unknown    Prior to Admission medications   Medication Sig Start Date End Date Taking? Authorizing Provider  amiodarone (PACERONE) 200 MG tablet Take 1 tablet (200 mg total) by mouth daily. 08/31/22   Jake Bathe, MD  cinacalcet (SENSIPAR) 60 MG tablet Take 120 mg by mouth every evening. 07/30/20   [provider]  docusate sodium (COLACE) 100 MG capsule Take 1 capsule (100 mg total) by mouth every 12 (twelve) hours. 12/10/20   Arby Barrette, MD  metoprolol tartrate (LOPRESSOR) 25 MG tablet Take 0.5 tablets (12.5 mg total) by mouth 2 (two) times daily. Hold on the AM of dialysis days 12/18/20   Jake Bathe, MD  Multiple Vitamins-Minerals (MULTIVITAMIN WITH MINERALS) tablet Take 1 tablet by mouth daily.    [provider]  RENVELA 2.4 g PACK Take 4.8 g by mouth in the morning, at noon, and at bedtime. 09/21/20   [provider]  warfarin (COUMADIN) 2 MG tablet Take 1 tablet by mouth daily or as directed by Anticoagulation Clinic. 09/19/22   Jake Bathe, MD      Results for orders placed or performed during the hospital encounter of 10/27/22 (from the past 48 hour(s))  Troponin I (High Sensitivity)     Status: Abnormal   Collection Time: 10/27/22  1:02 PM  Result Value Ref Range   Troponin I (High Sensitivity) 47 (H) <18 ng/L    Comment: (NOTE) Elevated high sensitivity troponin I (hsTnI) values and significant  changes across serial measurements may suggest ACS but many other  chronic and acute conditions are known to elevate hsTnI results.  Refer to the "Links" section for chest pain algorithms and additional  guidance. Performed at [facility], [address], [place], [place] [address]   CBC     Status: Abnormal   Collection Time: 10/27/22  1:02 PM  Result Value Ref Range   WBC 11.5 (H) 4.0 - 10.5 K/uL   RBC 3.54 (L) 4.22 - 5.81 MIL/uL   Hemoglobin 10.6 (L) 13.0 - 17.0 g/dL   HCT  02.7 (L) 25.3 - 52.0 %   MCV 95.8 80.0 - 100.0 fL   MCH 29.9 26.0 - 34.0 pg   MCHC 31.3 30.0 - 36.0 g/dL   RDW 66.4 40.3 - 47.4 %   Platelets 209 150 - 400 K/uL   nRBC 0.0 0.0 - 0.2 %    Comment: Performed at [facility], [address], [place], [place]  [address]  Comprehensive metabolic panel     Status: Abnormal   Collection Time: 10/27/22  1:02 PM  Result Value Ref Range   Sodium 142 135 - 145 mmol/L   Potassium 6.0 (H) 3.5 - 5.1 mmol/L   Chloride 97 (L) 98 - 111 mmol/L   CO2 27 22 - 32 mmol/L   Glucose, Bld 95 70 - 99 mg/dL    Comment: Glucose reference range applies only to samples taken after fasting for at least 8 hours.   BUN 103 (H) 6 - 20 mg/dL   Creatinine, Ser 59.56 (H) 0.61 - 1.24 mg/dL   Calcium 9.4 8.9 - 38.7 mg/dL   Total Protein 6.3 (L) 6.5 - 8.1 g/dL   Albumin 2.4 (L) 3.5 - 5.0 g/dL   AST 6 (L) 15 - 41 U/L   ALT 7 0 - 44 U/L   Alkaline Phosphatase 259 (H) 38 - 126 U/L   Total Bilirubin 0.7 0.3 - 1.2 mg/dL   GFR, Estimated 4 (L) >60 mL/min    Comment: (NOTE) Calculated using the CKD-EPI Creatinine Equation (2021)    Anion gap 18 (H) 5 - 15    Comment: Performed at [facility], [address] [place], [place] [address]  Lipase, blood     Status: None   Collection Time: 10/27/22  1:02 PM  Result Value Ref Range   Lipase 33 11 - 51 U/L    Comment: Performed at [facility], [address], [place], [place] [address]  I-stat chem 8, ED     Status: Abnormal   Collection Time: 10/27/22  1:14 PM  Result Value Ref Range   Sodium 139 135 - 145 mmol/L   Potassium 5.8 (H) 3.5 - 5.1 mmol/L   Chloride 101 98 - 111 mmol/L   BUN 96 (H) 6 - 20 mg/dL   Creatinine, Ser 84.16 (H) 0.61 - 1.24 mg/dL   Glucose, Bld 89 70 - 99 mg/dL    Comment: Glucose reference range applies only to samples taken after fasting for at least 8 hours.   Calcium, Ion 1.10 (L) 1.15 - 1.40 mmol/L   TCO2 27 22 - 32 mmol/L   Hemoglobin 10.9 (L) 13.0 - 17.0 g/dL   HCT 60.6  (L) 30.1 - 52.0 %     ROS:  see hpi  positives    Physical Exam: Vitals:   10/27/22 1445 10/27/22 1530  BP: 124/69 128/74  Pulse:  67  Resp: 18 20  Temp:  97.9 F (36.6 C)  SpO2:  95%     General: chronically ill, thin  AAM nad  HEENT: [place] , not icteric, eomi Neck: supple, mild jvd Heart: RRR, no MRG Lungs: Faint basilar rales ,nonlabored breathing on [place] o2   O2 Sat 92%  Abdomen: NABS,soft , Tender Lower quads , no rebound appreciated  Extremities: Bilat Hard  woody edema L >R  Skin: chronic lower extem skin  changes  Neuro: alert OX 3, no asterixis , bilat lower extrm weakness  Dialysis Access: R internal jugular TDC   Dialysis Orders: Center: [facility] TTS   EDW 66kg HD Bath 2k, 2ca UF P4, 4 Hrs  NO HEP ( HEP ALLERGY ) Sodium citrat l  Assessment/Plan Volume overload / Missed HD  = UF today on HD  Hyperkalemia = Missed Hd , hd today . Fu K labs  ESRD -  HD Nl TTS  schedule with ho some noncompliance  HD today  , FU  labs  ABD Pain /HO AAA Dissection 2004  repair   /Diarrhea= WU  per admit / CT  pend HO AAA Hypertension/volume  - bp initially sl elevated , Anemia  - hgb 10.9  no esa needs, fu hgb trend Metabolic bone disease -  Binder when po s ( renvela)   Ho Afib = on amiod. Coumadin / rx per admit  Nutrition - alb  2.4   needs protein supplement when pos   Lenny Pastel, PA-C [facility] Beeper [phone] 10/27/2022, 3:45 PM

## 2022-10-27 NOTE — H&P (Signed)
History and Physical    Patient: Corey [name] UXL:[id number] DOB: 1971-02-14 DOA: 10/27/2022 DOS: the patient was seen and examined on 10/27/2022 PCP: [name], NP  Patient coming from: Home  Chief Complaint:  Chief Complaint  Patient presents with   Flank Pain   Shoulder Pain   HPI: CHASTEN BLAZE is a 52 y.o. male with medical history significant of end-stage renal disease on hemodialysis TTS, paroxysmal atrial fibrillation on chronic Coumadin therapy, history of previous aortic dissection, peripheral vascular disease, history of osteoarthritis, history of neuropathy, who missed hemodialysis twice secondary to nausea vomiting and diarrhea.  Patient came to the ER with shortness of breath. Was found to be hypoxic with new oxygen requirement.  Also significant hyperkalemia.  Nephrology was consulted and patient is being dialyzed.  As part of his workup patient was found to have complex right-sided pleural effusion that looks loculated.  There is worried that this represents possible infection versus other loculated pleural effusion.  Patient is therefore being admitted to the hospital.  Patient currently being dialyzed as indicated.  Review of Systems: As mentioned in the history of present illness. All other systems reviewed and are negative. Past Medical History:  Diagnosis Date   Arthritis    hands and shoulders   Blindness and low vision    "Stargardt disease"   Dissection of aorta (HCC) 2004   a. s/p extensive repeair in 2004 in [place] complicated by ESRD, lower extremity paralysis, coma, and extended hospitalization of 2 years   ESRD (end stage renal disease) (HCC)    a. TTS   Headache    History of cardioversion 2014   Hypertension    Neuropathy    Non-healing non-surgical wound 03/2016   PAF (paroxysmal atrial fibrillation) (HCC)    a. s/p DCCV in 2014; b. on Coumadin; c. CHADS2VASc => 2 (HTN, vascular disease)   Paralysis (HCC)    due to dissection of aorta in 2004,  lower extremities   Pneumonia    Past Surgical History:  Procedure Laterality Date   APPLICATION OF WOUND VAC Left 04/13/2016   Procedure: APPLICATION OF WOUND VAC;  Surgeon: Fransisco Hertz, MD;  Location: [facility] OR;  Service: Vascular;  Laterality: Left;   APPLICATION OF WOUND VAC Left 04/18/2016   Procedure: APPLICATION OF WOUND VAC;  Surgeon: Maeola Harman, MD;  Location: [facility] OR;  Service: Vascular;  Laterality: Left;  Wound vac change    APPLICATION OF WOUND VAC Left 04/20/2016   Procedure: WOUND VAC CHANGE;  Surgeon: Fransisco Hertz, MD;  Location: [facility] OR;  Service: Vascular;  Laterality: Left;   AV FISTULA PLACEMENT     BASCILIC VEIN TRANSPOSITION Left 12/09/2015   Procedure: FIRST STAGE BASILIC VEIN TRANSPOSITION LEFT UPPER ARM;  Surgeon: Fransisco Hertz, MD;  Location: [facility] OR;  Service: Vascular;  Laterality: Left;   BASCILIC VEIN TRANSPOSITION Left 03/09/2016   Procedure: SECOND STAGE BASILIC VEIN TRANSPOSITION WITH REVISION OF ANASTOMOSIS LEFT UPPER ARM;  Surgeon: Fransisco Hertz, MD;  Location: [facility] OR;  Service: Vascular;  Laterality: Left;   CARDIOVERSION     CARDIOVERSION N/A 01/04/2019   Procedure: CARDIOVERSION;  Surgeon: Lewayne Bunting, MD;  Location: [facility] ENDOSCOPY;  Service: Cardiovascular;  Laterality: N/A;   REPAIR OF ACUTE ASCENDING THORACIC AORTIC DISSECTION     REVISON OF ARTERIOVENOUS FISTULA Left 04/20/2016   Procedure: LIGATION OF BASILIC VEIN TRANSPOSITION;  Surgeon: Fransisco Hertz, MD;  Location: [facility] OR;  Service: Vascular;  Laterality:  Left;   WOUND DEBRIDEMENT Left 04/13/2016   Procedure: DEBRIDEMENT WOUND;  Surgeon: Fransisco Hertz, MD;  Location: [facility] OR;  Service: Vascular;  Laterality: Left;   Social History:  reports that he has never smoked. He has never used smokeless tobacco. He reports that he does not drink alcohol and does not use drugs.  Allergies  Allergen Reactions   Ciprofloxacin Other (See Comments)    Aortic dissection   Heparin Other (See Comments)     UNSPECIFIED REACTION :  On Coumadin since 2004   HIT panel negative 01/19/17   Doxercalciferol Other (See Comments)   Quinolones Other (See Comments)    unknown    Family History  Problem Relation Age of Onset   Cancer Mother    Hypertension Mother    Cancer Father    Hypertension Father    Thyroid disease Sister    Stroke Brother        11    Prior to Admission medications   Medication Sig Start Date End Date Taking? Authorizing Provider  amiodarone (PACERONE) 200 MG tablet Take 1 tablet (200 mg total) by mouth daily. 08/31/22  Yes Jake Bathe, MD  cinacalcet (SENSIPAR) 60 MG tablet Take 120 mg by mouth every evening. 07/30/20  Yes [provider]  Multiple Vitamins-Minerals (MULTIVITAMIN WITH MINERALS) tablet Take 1 tablet by mouth daily.   Yes [provider]  RENVELA 2.4 g PACK Take 4.8 g by mouth in the morning, at noon, and at bedtime. 09/21/20  Yes [provider]  warfarin (COUMADIN) 2 MG tablet Take 1 tablet by mouth daily or as directed by Anticoagulation Clinic. 09/19/22  Yes Jake Bathe, MD  docusate sodium (COLACE) 100 MG capsule Take 1 capsule (100 mg total) by mouth every 12 (twelve) hours. Patient not taking: Reported on 10/27/2022 12/10/20   Arby Barrette, MD  metoprolol tartrate (LOPRESSOR) 25 MG tablet Take 0.5 tablets (12.5 mg total) by mouth 2 (two) times daily. Hold on the AM of dialysis days Patient not taking: Reported on 10/27/2022 12/18/20   Jake Bathe, MD    Physical Exam: Vitals:   10/27/22 1900 10/27/22 1922 10/27/22 1952 10/27/22 2022  BP: 114/68 116/71 120/69 118/70  Pulse: 64 64 65 65  Resp: 18 (!) 23 19 (!) 23  Temp:      TempSrc:      SpO2:  100% 100% 100%   Constitutional: Acutely ill looking, NAD, calm, comfortable Eyes: PERRL, lids and conjunctivae normal, legal blindness ENMT: Mucous membranes are moist. Posterior pharynx clear of any exudate or lesions.Normal dentition.  Neck: normal, supple, no masses,  no thyromegaly Respiratory: Decreased air entry bilaterally, no wheezing, diffuse crackles. Normal respiratory effort. No accessory muscle use.  Cardiovascular: Regular rate and rhythm, no murmurs / rubs / gallops. No extremity edema. 2+ pedal pulses. No carotid bruits.  Abdomen: no tenderness, no masses palpated. No hepatosplenomegaly. Bowel sounds positive.  Musculoskeletal: Good range of motion, no joint swelling or tenderness, Skin: no rashes, lesions, ulcers. No induration Neurologic: CN 2-12 grossly intact. Sensation intact, DTR normal. Strength 5/5 in all 4.  Psychiatric: Normal judgment and insight. Alert and oriented x 3. Normal mood  Data Reviewed:  Blood pressure 166/96, oxygen sat 95% on 2 L,Potassium 6.0 chloride 97, creatinine 13.68, alkaline phosphatase 259 albumin 2.4, white count 11.5 hemoglobin 10.6, chest x-ray showed cardiomegaly with central pulmonary vascular congestion and pulmonary interstitial edema, left pleural effusion with opacity within the moderate lower left lung  which may reflect atelectasis or consolidation CT chest shows stable appearing bilateral aortic dissection.  New loculated large left-sided pleural effusion with underlying left lower lobe consolidation  Assessment and Plan:  #1 large loculated left pleural effusion: The cause is unclear.  Could be infectious but patient is afebrile and no white count.  At this point we will admit the patient.  Initiate thoracentesis.  Will send the fluid for evaluation including cultures Gram stain and chemistry.  May consider empiric antibiotics if patient spikes a fever or increased white count.  His white count is 11 point 5 in the morning which is borderline.  #2 end-stage renal disease: Patient is being hemodialyzed.  Continue nephrology.  #3 hyperkalemia: Continue with hemodialysis.  #4 paroxysmal atrial fibrillation: Rate is now controlled.  Will hold warfarin in preparation for possible thoracentesis.  #5  chronic diastolic heart failure: Continue hemodialysis for now.  #6 essential hypertension: Blood pressure is elevated.  May improve after hemodialysis.    Advance Care Planning:   Code Status: Full Code   Consults: Dr. Charlann Lange nephrology  Family Communication: No family at bedside  Severity of Illness: The appropriate patient status for this patient is INPATIENT. Inpatient status is judged to be reasonable and necessary in order to provide the required intensity of service to ensure the patient's safety. The patient's presenting symptoms, physical exam findings, and initial radiographic and laboratory data in the context of their chronic comorbidities is felt to place them at high risk for further clinical deterioration. Furthermore, it is not anticipated that the patient will be medically stable for discharge from the hospital within 2 midnights of admission.   * I certify that at the point of admission it is my clinical judgment that the patient will require inpatient hospital care spanning beyond 2 midnights from the point of admission due to high intensity of service, high risk for further deterioration and high frequency of surveillance required.*  AuthorLonia Blood, MD 10/27/2022 9:08 PM  For on call review [email or web address].

## 2022-10-27 NOTE — ED Notes (Signed)
Pt transported to dialysis

## 2022-10-27 NOTE — ED Provider Notes (Signed)
Corey [name] EMERGENCY DEPARTMENT AT [facility] Provider Note   CSN: [id number] Arrival date & time: 10/27/22  1154     History  Chief complaint: Chest pain  Corey [name] is a 52 y.o. male.  HPI   Patient has a history of hypertension, dizziness, aortic dissection resulting in lower extremity paralysis and chronic renal failure, paroxysmal atrial fibrillation.  Patient states he started feeling poorly over the weekend.  He began having intermittent discomfort in his abdomen rating up towards his chest.  It also went into his left flank and left shoulder.  Patient states the symptoms have been getting progressively worse.  Patient also started experiencing diarrhea and frequent trips to the bathroom.  Is also having a lot of gas and bloating.  He did not go to dialysis on Saturday or Tuesday because of his symptoms.  He was also feeling poorly so he did not go to the today  Home Medications Prior to Admission medications   Medication Sig Start Date End Date Taking? Authorizing Provider  amiodarone (PACERONE) 200 MG tablet Take 1 tablet (200 mg total) by mouth daily. 08/31/22   Jake Bathe, MD  cinacalcet (SENSIPAR) 60 MG tablet Take 120 mg by mouth every evening. 07/30/20   [provider]  docusate sodium (COLACE) 100 MG capsule Take 1 capsule (100 mg total) by mouth every 12 (twelve) hours. 12/10/20   Arby Barrette, MD  metoprolol tartrate (LOPRESSOR) 25 MG tablet Take 0.5 tablets (12.5 mg total) by mouth 2 (two) times daily. Hold on the AM of dialysis days 12/18/20   Jake Bathe, MD  Multiple Vitamins-Minerals (MULTIVITAMIN WITH MINERALS) tablet Take 1 tablet by mouth daily.    [provider]  RENVELA 2.4 g PACK Take 4.8 g by mouth in the morning, at noon, and at bedtime. 09/21/20   [provider]  warfarin (COUMADIN) 2 MG tablet Take 1 tablet by mouth daily or as directed by Anticoagulation Clinic. 09/19/22   Jake Bathe, MD      Allergies     Ciprofloxacin, Heparin, Doxercalciferol, and Quinolones    Review of Systems   Review of Systems  Physical Exam Updated Vital Signs BP (!) 162/94   Pulse 66   Temp 97.9 F (36.6 C) (Oral)   Resp 18   SpO2 97%  Physical Exam Vitals and nursing note reviewed.  Constitutional:      Appearance: He is well-developed. He is not diaphoretic.  HENT:     Head: Normocephalic and atraumatic.     Right Ear: External ear normal.     Left Ear: External ear normal.  Eyes:     General: No scleral icterus.       Right eye: No discharge.        Left eye: No discharge.     Conjunctiva/sclera: Conjunctivae normal.  Neck:     Trachea: No tracheal deviation.  Cardiovascular:     Rate and Rhythm: Normal rate and regular rhythm.  Pulmonary:     Effort: Pulmonary effort is normal. No respiratory distress.     Breath sounds: Normal breath sounds. No stridor. No wheezing or rales.  Abdominal:     General: Bowel sounds are normal. There is no distension.     Palpations: Abdomen is soft.     Tenderness: There is abdominal tenderness. There is no guarding or rebound.  Musculoskeletal:        General: No tenderness or deformity.     Cervical  back: Neck supple.     Right lower leg: Edema present.     Left lower leg: Edema present.     Comments: Patient states that over edema has unchanged compared to baseline  Skin:    General: Skin is warm and dry.     Findings: No rash.  Neurological:     Mental Status: He is alert. Mental status is at baseline.     Cranial Nerves: No cranial nerve deficit, dysarthria or facial asymmetry.     Motor: Weakness present. No abnormal muscle tone or seizure activity.     Comments: Paralysis bilateral lower extremities  Psychiatric:        Mood and Affect: Mood normal.     ED Results / Procedures / Treatments   Labs (all labs ordered are listed, but only abnormal results are displayed) Labs Reviewed  CBC  COMPREHENSIVE METABOLIC PANEL  LIPASE, BLOOD  CBG  MONITORING, ED  I-STAT CHEM 8, ED  TROPONIN I (HIGH SENSITIVITY)    EKG EKG Interpretation Date/Time:  Thursday October 27 2022 11:55:09 EDT Ventricular Rate:  67 PR Interval:  269 QRS Duration:  112 QT Interval:  455 QTC Calculation: 481 R Axis:   159  Text Interpretation: Sinus rhythm Prolonged PR interval Right ventricular hypertrophy Lateral infarct, old No significant change since last tracing Confirmed by Linwood Dibbles [phone]) on 10/27/2022 12:06:10 PM  Radiology No results found.  Procedures Procedures    Medications Ordered in ED Medications  aspirin chewable tablet 324 mg (has no administration in time range)  morphine (PF) 4 MG/ML injection 4 mg (has no administration in time range)  ondansetron (ZOFRAN) injection 4 mg (has no administration in time range)    ED Course/ Medical Decision Making/ A&P Clinical Course as of 10/28/22 0559  Thu Oct 27, 2022  1514 Metabolic panel shows elevated BUN and creatinine.  Troponin elevated [JK]  1520 Will treat hyperkalemia with insulin and glucose.  With his persistent abdominal pain and history of prior to dissection we will proceed with CT scans chest abdomen pelvis [JK]  1528 Case discussed with Dr Arta Silence.  Will plan on dialysis for patient [JK]  1848 Pt taken for dialysis - noted to have large left pleural effusion on CT and stable imaging otherwise - will page for admission, discuss with inpatient service [MT]    Clinical Course User Index [JK] Linwood Dibbles, MD [MT] Terald Sleeper, MD                             Medical Decision Making Problems Addressed: CKD (chronic kidney disease) requiring chronic dialysis [facility]): chronic illness or injury with exacerbation, progression, or side effects of treatment Pleural effusion: acute illness or injury that poses a threat to life or bodily functions  Amount and/or Complexity of Data Reviewed Labs: ordered. Decision-making details documented in ED Course. Radiology:  ordered.  Risk OTC drugs. Prescription drug management. Decision regarding hospitalization.   Pt presented with complaints of vomiting, diarrhea, abd pain, radiating to cheset.  Hx of aortic dissection, renal failure.  Pt missed several days of dialysis due to recent illness.  Labs notable for hyperkalemia, uremia.  Pt will require dialysis, hospitalization..  Case discussed with nephrology.  With pt's abd and chest pain, ct scan chest abd pelvis ordered.  Pending at time of shift change. Plan on admission and further treatment based on CT scan findings.  Care turned over to Dr Renaye Rakers  Final Clinical Impression(s) / ED Diagnoses Final diagnoses:  Pleural effusion  CKD (chronic kidney disease) requiring chronic dialysis [facility])    Rx / DC Orders ED Discharge Orders     None         Linwood Dibbles, MD 10/28/22 [phone]

## 2022-10-27 NOTE — ED Triage Notes (Signed)
Pt BIB [facility] for CB, SOB.  EMS states he endoreses left shoulder and left flank pain as well.  PT has had this pain for a few days.  PT is T,Th,S dialysis pt and has missed last two tx and did not go today because of pain.  Pain is worse with movement and palpation. EMS endorses diminshed lung sounds.  Pt was found to be 85% RA on scene.  Pt is 96% on 2L on arrival.  158/86 HR, 68 NS, RR 18

## 2022-10-28 ENCOUNTER — Ambulatory Visit: Payer: 59 | Admitting: Physical Therapy

## 2022-10-28 ENCOUNTER — Encounter (HOSPITAL_COMMUNITY)
Admission: EM | Disposition: A | Discharge status: Rehabilitation Facility | Payer: Self-pay | Source: Home / Self Care | Attending: Student

## 2022-10-28 DIAGNOSIS — J90 Pleural effusion, not elsewhere classified: Secondary | ICD-10-CM | POA: Diagnosis not present

## 2022-10-28 LAB — PROTIME-INR
INR: 3.6 — ABNORMAL HIGH (ref 0.8–1.2)
Prothrombin Time: 36.3 seconds — ABNORMAL HIGH (ref 11.4–15.2)

## 2022-10-28 LAB — CBC: MCV: 95.9 fL (ref 80.0–100.0)

## 2022-10-28 LAB — GLUCOSE, CAPILLARY: Glucose-Capillary: 60 mg/dL — ABNORMAL LOW (ref 70–99)

## 2022-10-28 SURGERY — CHEST TUBE INSERTION

## 2022-10-28 MED ORDER — CHLORHEXIDINE GLUCONATE CLOTH 2 % EX PADS
6.0000 | MEDICATED_PAD | Freq: Every day | CUTANEOUS | Status: DC
  Administered 2022-10-28 – 2022-11-08 (×11): 6 via TOPICAL

## 2022-10-28 MED ORDER — LIDOCAINE 5 % EX PTCH
3.0000 | MEDICATED_PATCH | CUTANEOUS | Status: DC
  Administered 2022-10-28: 1 via TRANSDERMAL
  Administered 2022-10-29 – 2022-11-28 (×26): 3 via TRANSDERMAL
  Filled 2022-10-28 (×29): qty 3

## 2022-10-28 MED ORDER — OXYCODONE HCL 5 MG PO TABS
5.0000 mg | ORAL_TABLET | Freq: Four times a day (QID) | ORAL | Status: DC | PRN
  Administered 2022-10-28 (×2): 5 mg via ORAL
  Filled 2022-10-28 (×2): qty 1

## 2022-10-28 MED ORDER — HYDROMORPHONE HCL 1 MG/ML IJ SOLN
0.5000 mg | INTRAMUSCULAR | Status: DC | PRN
  Administered 2022-10-31 – 2022-11-03 (×5): 0.5 mg via INTRAVENOUS
  Filled 2022-10-28 (×5): qty 0.5

## 2022-10-28 MED ORDER — ACETAMINOPHEN 325 MG PO TABS
650.0000 mg | ORAL_TABLET | Freq: Four times a day (QID) | ORAL | Status: DC | PRN
  Administered 2022-10-29 – 2022-11-02 (×4): 650 mg via ORAL
  Filled 2022-10-28 (×4): qty 2

## 2022-10-28 MED ORDER — CINACALCET HCL 30 MG PO TABS
180.0000 mg | ORAL_TABLET | Freq: Every evening | ORAL | Status: DC
  Administered 2022-10-28 – 2022-10-31 (×4): 180 mg via ORAL
  Filled 2022-10-28 (×4): qty 6

## 2022-10-28 NOTE — Progress Notes (Signed)
PROGRESS NOTE    Corey [name]  ZOX:[id number] DOB: 1971-03-19 DOA: 10/27/2022 PCP: Corey Sessions, NP  Chief Complaint  Patient presents with   Flank Pain   Shoulder Pain    Brief Narrative:   Corey [name] is Corey [name] 52 y.o. male with medical history significant of end-stage renal disease on hemodialysis TTS, paroxysmal atrial fibrillation on chronic Coumadin therapy, history of previous aortic dissection, peripheral vascular disease, history of osteoarthritis, history of neuropathy, who missed hemodialysis twice secondary to nausea vomiting and diarrhea.  Patient came to the ER with shortness of breath. Was found to be hypoxic with new oxygen requirement.  Also significant hyperkalemia.  Nephrology was consulted and patient is being dialyzed.  As part of his workup patient was found to have complex right-sided pleural effusion that looks loculated.  There is worried that this represents possible infection versus other loculated pleural effusion.  Patient is therefore being admitted to the hospital.  Patient currently being dialyzed as indicated.   Assessment & Plan:   Principal Problem:   Pleural effusion Active Problems:   ESRD on dialysis [facility])   Essential hypertension   Chronic diastolic CHF (congestive heart failure) (HCC)   Atrial fibrillation, chronic (HCC)   End-stage renal disease on hemodialysis (HCC)   AAA (abdominal aortic aneurysm) without rupture (HCC)   Hyperkalemia  #1 large loculated left pleural effusion: Appreciate pulmonary following, awaiting improvement in INR   #2 end-stage renal disease:  Appreciate renal following HD per renal    #3 hyperkalemia: dialysis per renal    #4 paroxysmal atrial fibrillation: Rate is now controlled.  Will hold warfarin in preparation for possible thoracentesis.   #5 chronic diastolic heart failure: volume per renal with dialysis  Echo 2020 with EF 60-65%, increased LV wall thickness   #6 essential hypertension: Blood  pressure is elevated.  May improve after hemodialysis.  Doesn't appear he's taking any BP meds at this time.  # 7 Chronic Type B Aortic Dissection  Thoracicoabdominal Aortic Aneurysm Following outpatient      DVT prophylaxis: inr 3.6, warfarin on hold Code Status: full Family Communication: noen Disposition:   Status is: Inpatient    Consultants:  Pulm nephrology  Procedures:  none  Antimicrobials:  Anti-infectives (From admission, onward)    None       Subjective: No new complaints  Objective: Vitals:   10/28/22 0000 10/28/22 0328 10/28/22 1128 10/28/22 1132  BP: 124/68 119/69 114/65   Pulse: 68 67 67 69  Resp: 20 20 (!) 26 17  Temp:  98.3 F (36.8 C) 99.6 F (37.6 C)   TempSrc:  Oral Oral   SpO2: 92% 93% 92% 92%  Weight:      Height:        Intake/Output Summary (Last 24 hours) at 10/28/2022 1625 Last data filed at 10/27/2022 2222 Gross per 24 hour  Intake --  Output 3000 ml  Net -3000 ml   Filed Weights   10/27/22 2338  Weight: 62.9 kg    Examination:  General exam: Appears calm and comfortable  Respiratory system: unlabored Cardiovascular system: RRR Gastrointestinal system: Abdomen is nondistended, soft and nontender.  TTP to L side Central nervous system: Alert and oriented. No focal neurological deficits. Extremities: no LEE    Data Reviewed: I have personally reviewed following labs and imaging studies  CBC: Recent Labs  Lab 10/27/22 1302 10/27/22 1314 10/28/22 0104  WBC 11.5*  --  10.6*  HGB 10.6* 10.9* 10.2*  HCT  33.9* 32.0* 33.0*  MCV 95.8  --  95.9  PLT 209  --  187    Basic Metabolic Panel: Recent Labs  Lab 10/27/22 1302 10/27/22 1314 10/28/22 0104  NA 142 139 137  K 6.0* 5.8* 4.1  CL 97* 101 95*  CO2 27  --  26  GLUCOSE 95 89 94  BUN 103* 96* 40*  CREATININE 13.68* 13.60* 7.25*  CALCIUM 9.4  --  9.0    GFR: Estimated Creatinine Clearance: 10.6 mL/min (Corey [name]) (by C-G formula based on SCr of 7.25 mg/dL  (H)).  Liver Function Tests: Recent Labs  Lab 10/27/22 1302 10/28/22 0104  AST 6* 5*  ALT 7 5  ALKPHOS 259* 254*  BILITOT 0.7 1.0  PROT 6.3* 6.2*  ALBUMIN 2.4* 2.2*    CBG: Recent Labs  Lab 10/28/22 0008  GLUCAP 60*     Recent Results (from the past 240 hour(s))  MRSA Next Gen by PCR, Nasal     Status: None   Collection Time: 10/28/22 12:03 AM   Specimen: Nasal Mucosa; Nasal Swab  Result Value Ref Range Status   MRSA by PCR Next Gen NOT DETECTED NOT DETECTED Final    Comment: (NOTE) The GeneXpert MRSA Assay (FDA approved for NASAL specimens only), is one component of Corey [name] comprehensive MRSA colonization surveillance program. It is not intended to diagnose MRSA infection nor to guide or monitor treatment for MRSA infections. Test performance is not FDA approved in patients less than 41 years old. Performed at [facility], [address] [place], [place] [address]          Radiology Studies: CT CHEST ABDOMEN PELVIS W CONTRAST  Result Date: 10/27/2022 CLINICAL DATA:  History of prior thoracoabdominal dissection with prior repair, chest and abdominal pain EXAM: CT CHEST, ABDOMEN, AND PELVIS WITH CONTRAST TECHNIQUE: Multidetector CT imaging of the chest, abdomen and pelvis was performed following the standard protocol during bolus administration of intravenous contrast. RADIATION DOSE REDUCTION: This exam was performed according to the departmental dose-optimization program which includes automated exposure control, adjustment of the mA and/or kV according to patient size and/or use of iterative reconstruction technique. CONTRAST:  75mL OMNIPAQUE IOHEXOL 350 MG/ML SOLN COMPARISON:  11/01/2021 FINDINGS: CT CHEST FINDINGS Cardiovascular: Atherosclerotic calcifications of the thoracic aorta are noted. Ascending aorta demonstrates Corey [name] normal caliber without aneurysmal dilatation. There is Corey [name] type B dissection identified in the distal thoracic aortic arch and descending  thoracic aorta. The true lumen is diminutive similar to that seen on the prior exam. Improved enhancement of the false lumen is seen although this is likely related to the timing of the contrast bolus. Heart is mildly enlarged in size. Central catheter is noted consistent with dialysis. Pulmonary artery is well visualized within normal branching pattern. The degree of enhancement is limited although no large central pulmonary embolus is seen. Mediastinum/Nodes: Thoracic inlet is within normal limits. No hilar or mediastinal adenopathy is noted. The esophagus as visualized is within normal limits. Lungs/Pleura: Right lung demonstrates mild basilar atelectatic changes. Mild interstitial thickening is noted consistent with Luismiguel Lamere degree of interstitial edema. This is similar to prior chest x-ray. Loculated left-sided pleural effusion is noted with considerable lower lobe consolidation. No nodular density is seen. Musculoskeletal: Bony structures show increased endplate sclerosis consistent with the underlying end-stage renal disease. No acute bony abnormality is seen. CT ABDOMEN PELVIS FINDINGS Hepatobiliary: No focal liver abnormality is seen. No gallstones, gallbladder wall thickening, or biliary dilatation. Pancreas: Unremarkable. No pancreatic ductal  dilatation or surrounding inflammatory changes. Spleen: Normal in size without focal abnormality. Adrenals/Urinary Tract: Adrenal glands are within normal limits. Kidneys demonstrate multiple cysts throughout the right kidney consistent with polycystic kidney disease. No follow-up is recommended. Left kidney is surgically absent. The bladder is decompressed. Stomach/Bowel: No obstructive or inflammatory changes of the colon are noted. The appendix is not well visualized. No inflammatory changes to suggest appendicitis are noted. Small bowel and stomach are within normal limits. Vascular/Lymphatic: The dissection flap seen in the descending thoracic aorta extends to just  below the origin of the superior mesenteric artery similar to seen on the prior exam. The celiac axis and superior mesenteric artery as well as the right renal artery arise from the true lumen. The left renal artery is not visualized consistent with the surgical history. Stable penetrating ulcer is noted in the distal abdominal aorta. Iliac vessels show calcification without focal abnormality. IVC filter is noted in satisfactory position. Reproductive: Prostate is unremarkable. Other: No abdominal wall hernia or abnormality. No abdominopelvic ascites. Musculoskeletal: Diffuse soft tissue calcifications are noted related to arterial calcinosis. Endplate sclerosis is noted throughout the lumbar spine consistent with the given clinical history. IMPRESSION: Stable appearing type B aortic dissection as described arising just beyond the left subclavian artery and continuing to just below the origin of the SMA. Stable penetrating ulcer in the lateral aspect of the abdominal aorta on the right. New loculated large left-sided pleural effusion with underlying left lower lobe consolidation. Electronically Signed   By: Alcide Clever M.D.   On: 10/27/2022 17:40   DG Chest Portable 1 View  Result Date: 10/27/2022 CLINICAL DATA:  Provided history: Left shoulder and left flank pain. Diminished lung sounds. EXAM: PORTABLE CHEST 1 VIEW COMPARISON:  CT angiogram chest 11/01/2021. Prior chest radiographs 06/06/2018 and earlier. FINDINGS: Karessa Onorato right-sided central venous catheter terminates at the level of the right atrium. Cardiomegaly. Aortic atherosclerosis. Central pulmonary vascular congestion. Prominence of the interstitial lung markings consistent with interstitial edema. Redemonstrated bandlike focus of scarring within the right lung base. Opacities within the mid and lower left lung. Left pleural effusion. No evidence of pneumothorax. No acute osseous abnormality identified. Dextrocurvature of the thoracic spine. Vascular stent  within the right axilla/upper extremity. IMPRESSION: 1. Cardiomegaly with central pulmonary vascular congestion and pulmonary interstitial edema. 2. Opacities within the mid and lower left lung, which may reflect atelectasis and/or airspace consolidation. 3. Left pleural effusion. 4. Aortic Atherosclerosis (ICD10-I70.0). Electronically Signed   By: Jackey Loge D.O.   On: 10/27/2022 13:19        Scheduled Meds:  amiodarone  200 mg Oral Daily   Chlorhexidine Gluconate Cloth  6 each Topical Daily   cinacalcet  180 mg Oral QPM   sevelamer carbonate  4.8 g Oral TID WC   Continuous Infusions:  anticoagulant sodium citrate       LOS: 1 day    Time spent: over 30 min    Lacretia Nicks, MD [facility]   To contact the attending provider between 7A-7P or the covering provider during after hours 7P-7A, please log into the web site [email or web address] and access using universal [place] password for that web site. If you do not have the password, please call the hospital operator.  10/28/2022, 4:25 PM

## 2022-10-28 NOTE — Progress Notes (Signed)
Unable to assess  his bilateral legs further as pt refused to take his socks off, he said he's been having severe pain. Legs looks swollen +4 and patient won't let us  touch it.  Paged Hospitalist.  Pt stated he has no appetite. Last meal was 7/24 and just only drinking water. CBG is 60, offered the frozen dinner and he was able to finish it. Care ongoing.

## 2022-10-28 NOTE — Consult Note (Signed)
NAME:  Corey [name], MRN:  [id number], DOB:  1971-02-20, LOS: 1 ADMISSION DATE:  10/27/2022, CONSULTATION DATE:  10/28/2022 REFERRING MD:  Roney Mans, CHIEF COMPLAINT:  pleural effusion.   History of Present Illness:  52 year old man who is on hemodialysis with a left pleural effusion. He has had general decline in the state of health over the last several months with loss of appetite. He had a relatively abrupt onset of left chest discomfort which she like into a sensation of trapped gas in the left side with a pleuritic component.  Mild cough and somewhat increased shortness of breath.  Some subjective fever and chills.  CT shows a moderate to large loculated left pleural effusion.  Pertinent  Medical History   Past Medical History:  Diagnosis Date   Arthritis    hands and shoulders   Blindness and low vision    "Stargardt disease"   Dissection of aorta (HCC) 2004   a. s/p extensive repeair in 2004 in [place] complicated by ESRD, lower extremity paralysis, coma, and extended hospitalization of 2 years   ESRD (end stage renal disease) (HCC)    a. TTS   Headache    History of cardioversion 2014   Hypertension    Neuropathy    Non-healing non-surgical wound 03/2016   PAF (paroxysmal atrial fibrillation) (HCC)    a. s/p DCCV in 2014; b. on Coumadin; c. CHADS2VASc => 2 (HTN, vascular disease)   Paralysis (HCC)    due to dissection of aorta in 2004, lower extremities   Pneumonia      Significant Hospital Events: Including procedures, antibiotic start and stop dates in addition to other pertinent events   7/25 - CT shows loculated left pleural effusion with associated consolidation.  No evidence of lymphadenopathy or mass.  Interim History / Subjective:  Symptoms remain unchanged.  Objective   Blood pressure 114/65, pulse 69, temperature 99.6 F (37.6 C), temperature source Oral, resp. rate 17, height 6\' 3"  (1.905 m), weight 62.9 kg, SpO2 92%.        Intake/Output Summary  (Last 24 hours) at 10/28/2022 1253 Last data filed at 10/27/2022 2222 Gross per 24 hour  Intake --  Output 3000 ml  Net -3000 ml   Filed Weights   10/27/22 2338  Weight: 62.9 kg    Examination: General: Frail-appearing.  No distress HENT: No cervical lymphadenopathy.  Normal thyroid.  No scleral icterus. Lungs: Bibasilar crackles.  East vocal fremitus left chest. Cardiovascular: Normal heart sounds and no edema. Abdomen: Soft nontender Extremities: Nonfunctioning right arm fistula.  Calciphylaxis Neuro: No focal deficits. GU: Anuric  Resolved Hospital Problem list   INR is 3.6.  Assessment & Plan:  Loculated pleural effusion, which given his symptomatology, I believe is relatively acute.  I suspect spontaneous bleed from anticoagulation given the no organized nature of the effusion.  A parapneumonic effusion is also a possibility although would have expected more prominent systemic or respiratory symptoms. Given his weight loss malignancy is also possible, although the chest discomfort is a fairly abrupt onset and there is no visible mass or lymphadenopathy. End-stage renal disease on hemodialysis Unexplained weight loss Calciphylaxis  Plan:  -This gentleman needs sampling and evacuation of the pleural fluid.  Unfortunately his INR is elevated and I do not believe that he is currently symptomatic enough to justify emergent chest tube placement or acute reversal of anticoagulation. -Rather we will follow serial INR's and allow it to correct gradually.  Once the INR is less  than 1.5 we can proceed with sampling and then either small bore or large bore chest tube depending on the nature of the effusion. -I have advised the patient of the above and he is willing to proceed.  Lynnell Catalan, MD [facility] ICU Physician [facility]  Pager: [phone] Or Epic Secure Chat After hours: [phone].  10/28/2022, 1:08 PM

## 2022-10-28 NOTE — Progress Notes (Signed)
Pt receives out-pt HD at [facility] on TTS. Will assist as needed.   Tracy Mounce Renal Navigator [phone] 

## 2022-10-28 NOTE — Progress Notes (Signed)
[facility] Progress Note   Subjective:   Seen in ICU room - remains on nasal O2 but reports dyspnea is better. Having LLQ abdominal pain and both LE are very tender. Says HD went fine yesterday - net UF 3L.  Further Hx - skips nearly every Tuesday HD. Hx LUE calciphylaxis (2018). Has been steadily losing weight this year unintentionally - EDW has been lowered ~10lb this year.  Objective Vitals:   10/27/22 2222 10/27/22 2338 10/28/22 0000 10/28/22 0328  BP: 134/73  124/68 119/69  Pulse: 71  68 67  Resp: (!) 23  20 20   Temp: 98.2 F (36.8 C) 97.9 F (36.6 C)  98.3 F (36.8 C)  TempSrc: Oral Oral  Oral  SpO2: 100%  92% 93%  Weight:  62.9 kg    Height:  6\' 3"  (1.905 m)     Physical Exam General: Chronically ill appearing man, NAD. Nasal O2 in place Heart: RRR; no murmur Lungs: CTA anteriorly Abdomen: soft, mild TTP in LLQ, no rebound tenderness Extremities: BLE with 1-2+ edema, TTP bilaterally Dialysis Access: [facility]  Additional Objective Labs: Basic Metabolic Panel: Recent Labs  Lab 10/27/22 1302 10/27/22 1314 10/28/22 0104  NA 142 139 137  K 6.0* 5.8* 4.1  CL 97* 101 95*  CO2 27  --  26  GLUCOSE 95 89 94  BUN 103* 96* 40*  CREATININE 13.68* 13.60* 7.25*  CALCIUM 9.4  --  9.0   Liver Function Tests: Recent Labs  Lab 10/27/22 1302 10/28/22 0104  AST 6* 5*  ALT 7 5  ALKPHOS 259* 254*  BILITOT 0.7 1.0  PROT 6.3* 6.2*  ALBUMIN 2.4* 2.2*   Recent Labs  Lab 10/27/22 1302  LIPASE 33   CBC: Recent Labs  Lab 10/27/22 1302 10/27/22 1314 10/28/22 0104  WBC 11.5*  --  10.6*  HGB 10.6* 10.9* 10.2*  HCT 33.9* 32.0* 33.0*  MCV 95.8  --  95.9  PLT 209  --  187  Studies/Results: CT CHEST ABDOMEN PELVIS W CONTRAST  Result Date: 10/27/2022 CLINICAL DATA:  History of prior thoracoabdominal dissection with prior repair, chest and abdominal pain EXAM: CT CHEST, ABDOMEN, AND PELVIS WITH CONTRAST TECHNIQUE: Multidetector CT imaging of the chest,  abdomen and pelvis was performed following the standard protocol during bolus administration of intravenous contrast. RADIATION DOSE REDUCTION: This exam was performed according to the departmental dose-optimization program which includes automated exposure control, adjustment of the mA and/or kV according to patient size and/or use of iterative reconstruction technique. CONTRAST:  75mL OMNIPAQUE IOHEXOL 350 MG/ML SOLN COMPARISON:  11/01/2021 FINDINGS: CT CHEST FINDINGS Cardiovascular: Atherosclerotic calcifications of the thoracic aorta are noted. Ascending aorta demonstrates a normal caliber without aneurysmal dilatation. There is a type B dissection identified in the distal thoracic aortic arch and descending thoracic aorta. The true lumen is diminutive similar to that seen on the prior exam. Improved enhancement of the false lumen is seen although this is likely related to the timing of the contrast bolus. Heart is mildly enlarged in size. Central catheter is noted consistent with dialysis. Pulmonary artery is well visualized within normal branching pattern. The degree of enhancement is limited although no large central pulmonary embolus is seen. Mediastinum/Nodes: Thoracic inlet is within normal limits. No hilar or mediastinal adenopathy is noted. The esophagus as visualized is within normal limits. Lungs/Pleura: Right lung demonstrates mild basilar atelectatic changes. Mild interstitial thickening is noted consistent with a degree of interstitial edema. This is similar to prior chest x-ray.  Loculated left-sided pleural effusion is noted with considerable lower lobe consolidation. No nodular density is seen. Musculoskeletal: Bony structures show increased endplate sclerosis consistent with the underlying end-stage renal disease. No acute bony abnormality is seen. CT ABDOMEN PELVIS FINDINGS Hepatobiliary: No focal liver abnormality is seen. No gallstones, gallbladder wall thickening, or biliary dilatation.  Pancreas: Unremarkable. No pancreatic ductal dilatation or surrounding inflammatory changes. Spleen: Normal in size without focal abnormality. Adrenals/Urinary Tract: Adrenal glands are within normal limits. Kidneys demonstrate multiple cysts throughout the right kidney consistent with polycystic kidney disease. No follow-up is recommended. Left kidney is surgically absent. The bladder is decompressed. Stomach/Bowel: No obstructive or inflammatory changes of the colon are noted. The appendix is not well visualized. No inflammatory changes to suggest appendicitis are noted. Small bowel and stomach are within normal limits. Vascular/Lymphatic: The dissection flap seen in the descending thoracic aorta extends to just below the origin of the superior mesenteric artery similar to seen on the prior exam. The celiac axis and superior mesenteric artery as well as the right renal artery arise from the true lumen. The left renal artery is not visualized consistent with the surgical history. Stable penetrating ulcer is noted in the distal abdominal aorta. Iliac vessels show calcification without focal abnormality. IVC filter is noted in satisfactory position. Reproductive: Prostate is unremarkable. Other: No abdominal wall hernia or abnormality. No abdominopelvic ascites. Musculoskeletal: Diffuse soft tissue calcifications are noted related to arterial calcinosis. Endplate sclerosis is noted throughout the lumbar spine consistent with the given clinical history. IMPRESSION: Stable appearing type B aortic dissection as described arising just beyond the left subclavian artery and continuing to just below the origin of the SMA. Stable penetrating ulcer in the lateral aspect of the abdominal aorta on the right. New loculated large left-sided pleural effusion with underlying left lower lobe consolidation. Electronically Signed   By: Alcide Clever M.D.   On: 10/27/2022 17:40   DG Chest Portable 1 View  Result Date:  10/27/2022 CLINICAL DATA:  Provided history: Left shoulder and left flank pain. Diminished lung sounds. EXAM: PORTABLE CHEST 1 VIEW COMPARISON:  CT angiogram chest 11/01/2021. Prior chest radiographs 06/06/2018 and earlier. FINDINGS: A right-sided central venous catheter terminates at the level of the right atrium. Cardiomegaly. Aortic atherosclerosis. Central pulmonary vascular congestion. Prominence of the interstitial lung markings consistent with interstitial edema. Redemonstrated bandlike focus of scarring within the right lung base. Opacities within the mid and lower left lung. Left pleural effusion. No evidence of pneumothorax. No acute osseous abnormality identified. Dextrocurvature of the thoracic spine. Vascular stent within the right axilla/upper extremity. IMPRESSION: 1. Cardiomegaly with central pulmonary vascular congestion and pulmonary interstitial edema. 2. Opacities within the mid and lower left lung, which may reflect atelectasis and/or airspace consolidation. 3. Left pleural effusion. 4. Aortic Atherosclerosis (ICD10-I70.0). Electronically Signed   By: Jackey Loge D.O.   On: 10/27/2022 13:19    Medications:  anticoagulant sodium citrate      amiodarone  200 mg Oral Daily   Chlorhexidine Gluconate Cloth  6 each Topical Daily   cinacalcet  120 mg Oral QPM   sevelamer carbonate  4.8 g Oral TID WC    Dialysis Orders: TTS [facility] 4hr, 2K/2Ca, UFP #4, TDC, EDW 66kg, no heparin (allergy) - locks TDC with CaCitrate - No ESA or VDRA - Binder: Renvela pwdr 4.8g TID - Sensipar 180mg  every day at home  Assessment/Plan: Acute hypoxic respiratory failure: In setting of missed HD, s/p HD yesterday. Still with LE edema which  is chronic. Hyperkalemia: On admit, improved with HD. Loculated L pleural effusion: For thoracentesis with cultures. Afebrile. + subjective chills. ESRD: Resume usual TTS schedule - next HD tomorrow. BP/volume: BP chronically low - uses mido pre-HD. Now well below prior  EDW - lower on d/c. Anemia of ESRD:  Hgb 10.2, no ESA for now. Secondary HPTH: CorrCa a little high - continue home binders + cinacalcet (dose adjusted) A-fib: On amiodarone + warfarin (on hold for procedure), has IVC filter Hx type B aortic dissection s/p repair (2004)  Ozzie Hoyle, PA-C 10/28/2022, 9:00 AM  [facility]

## 2022-10-29 DIAGNOSIS — J90 Pleural effusion, not elsewhere classified: Secondary | ICD-10-CM | POA: Diagnosis not present

## 2022-10-29 LAB — CBC
HCT: 33 % — ABNORMAL LOW (ref 39.0–52.0)
Hemoglobin: 9.7 g/dL — ABNORMAL LOW (ref 13.0–17.0)
MCH: 29.3 pg (ref 26.0–34.0)
MCHC: 29.4 g/dL — ABNORMAL LOW (ref 30.0–36.0)
MCV: 99.7 fL (ref 80.0–100.0)
Platelets: 185 10*3/uL (ref 150–400)
RBC: 3.31 MIL/uL — ABNORMAL LOW (ref 4.22–5.81)
RDW: 14.9 % (ref 11.5–15.5)
WBC: 11.3 10*3/uL — ABNORMAL HIGH (ref 4.0–10.5)
nRBC: 0 % (ref 0.0–0.2)

## 2022-10-29 MED ORDER — PROSOURCE PLUS PO LIQD
30.0000 mL | Freq: Two times a day (BID) | ORAL | Status: DC
  Administered 2022-10-29 – 2022-11-16 (×10): 30 mL via ORAL
  Filled 2022-10-29 (×22): qty 30

## 2022-10-29 MED ORDER — DARBEPOETIN ALFA 40 MCG/0.4ML IJ SOSY
40.0000 ug | PREFILLED_SYRINGE | INTRAMUSCULAR | Status: DC
  Administered 2022-10-29 – 2022-11-26 (×5): 40 ug via SUBCUTANEOUS
  Filled 2022-10-29 (×5): qty 0.4

## 2022-10-29 MED ORDER — MIDODRINE HCL 5 MG PO TABS
ORAL_TABLET | ORAL | Status: AC
  Filled 2022-10-29: qty 2

## 2022-10-29 MED ORDER — ALBUMIN HUMAN 25 % IV SOLN
25.0000 g | Freq: Once | INTRAVENOUS | Status: AC
  Administered 2022-10-29: 25 g via INTRAVENOUS

## 2022-10-29 MED ORDER — MIDODRINE HCL 5 MG PO TABS
10.0000 mg | ORAL_TABLET | ORAL | Status: DC
  Administered 2022-10-29 – 2022-11-01 (×3): 10 mg via ORAL
  Filled 2022-10-29 (×2): qty 2

## 2022-10-29 MED ORDER — ALBUMIN HUMAN 25 % IV SOLN
INTRAVENOUS | Status: AC
  Administered 2022-10-29: 25 g via INTRAVENOUS_CENTRAL
  Filled 2022-10-29: qty 200

## 2022-10-29 NOTE — Progress Notes (Signed)
   10/29/22 1146  Vitals  Temp 97.6 F (36.4 C)  Pulse Rate 69  Resp 17  BP 103/68  SpO2 100 %  O2 Device Nasal Cannula  Weight 61.6 kg  Type of Weight Post-Dialysis  Oxygen Therapy  O2 Flow Rate (L/min) 2 L/min  Patient Activity (if Appropriate) In bed  Pulse Oximetry Type Continuous  Oximetry Probe Site Changed No  Post Treatment  Duration of HD Treatment -hour(s) 3 hour(s)  Hemodialysis Intake (mL) 1.3 mL  Liters Processed 72  Fluid Removed (mL) 1.5 mL  Tolerated HD Treatment Yes     10/29/22 1146  Vitals  Temp 97.6 F (36.4 C)  Pulse Rate 69  Resp 17  BP 103/68  SpO2 100 %  O2 Device Nasal Cannula  Weight 61.6 kg  Type of Weight Post-Dialysis  Oxygen Therapy  O2 Flow Rate (L/min) 2 L/min  Patient Activity (if Appropriate) In bed  Pulse Oximetry Type Continuous  Oximetry Probe Site Changed No  Post Treatment  Duration of HD Treatment -hour(s) 3 hour(s)  Hemodialysis Intake (mL) 1.3 mL  Liters Processed 72  Fluid Removed (mL) 1.5 mL  Tolerated HD Treatment Yes   Received patient in bed to unit.  Alert and oriented.  Informed consent signed and in chart.   TX duration:3.5  Patient tolerated well.  Transported back to the room  Alert, without acute distress.  Hand-off given to patient's nurse.   Access used: [facility] Access issues: no complications  Total UF removed: 1.3 Medication(s) given: albumin 25 grams iv x 1----sodium citrate 4%---1.9 ml to bilateral dialysis lumens   [facility]

## 2022-10-29 NOTE — Progress Notes (Addendum)
[facility] Progress Note   Subjective:  Seen at onset of HD - hypotensive this AM - will give midodrine with HD. Some dyspnea, ongoing L abd pain. UF goal was ordered for 3.5L - may have to reduce due to hypotension - will see.  Objective Vitals:   10/28/22 2000 10/28/22 2351 10/29/22 0416 10/29/22 0826  BP:  (!) 96/59 96/61 (!) 88/56  Pulse: 73 69 74 71  Resp:  18 20 (!) 22  Temp:  99.3 F (37.4 C) 99.8 F (37.7 C) 98.3 F (36.8 C)  TempSrc:  Oral Oral   SpO2: 93% 98% 95% 97%  Weight:    63.1 kg  Height:       Physical Exam General: Chronically ill appearing man, NAD. Nasal O2 in place Heart: RRR; no murmur Lungs: Dull L base, otherwise clear Abdomen: soft, mild TTP in L abdomen Extremities: BLE with 1-2+ edema, TTP bilaterally (less than yesterday) Dialysis Access: [facility]    Additional Objective Labs: Basic Metabolic Panel: Recent Labs  Lab 10/27/22 1302 10/27/22 1314 10/28/22 0104 10/29/22 0048  NA 142 139 137 138  K 6.0* 5.8* 4.1 4.8  CL 97* 101 95* 96*  CO2 27  --  26 29  GLUCOSE 95 89 94 105*  BUN 103* 96* 40* 54*  CREATININE 13.68* 13.60* 7.25* 8.68*  CALCIUM 9.4  --  9.0 8.2*  PHOS  --   --   --  4.6   Liver Function Tests: Recent Labs  Lab 10/27/22 1302 10/28/22 0104 10/29/22 0048  AST 6* 5* 6*  ALT 7 5 7   ALKPHOS 259* 254* 220*  BILITOT 0.7 1.0 0.6  PROT 6.3* 6.2* 5.9*  ALBUMIN 2.4* 2.2* 2.1*   Recent Labs  Lab 10/27/22 1302  LIPASE 33   CBC: Recent Labs  Lab 10/27/22 1302 10/27/22 1314 10/28/22 0104 10/29/22 0048  WBC 11.5*  --  10.6* 11.0*  NEUTROABS  --   --   --  9.2*  HGB 10.6* 10.9* 10.2* 9.9*  HCT 33.9* 32.0* 33.0* 33.2*  MCV 95.8  --  95.9 100.6*  PLT 209  --  187 191   Studies/Results: CT CHEST ABDOMEN PELVIS W CONTRAST  Result Date: 10/27/2022 CLINICAL DATA:  History of prior thoracoabdominal dissection with prior repair, chest and abdominal pain EXAM: CT CHEST, ABDOMEN, AND PELVIS WITH CONTRAST  TECHNIQUE: Multidetector CT imaging of the chest, abdomen and pelvis was performed following the standard protocol during bolus administration of intravenous contrast. RADIATION DOSE REDUCTION: This exam was performed according to the departmental dose-optimization program which includes automated exposure control, adjustment of the mA and/or kV according to patient size and/or use of iterative reconstruction technique. CONTRAST:  75mL OMNIPAQUE IOHEXOL 350 MG/ML SOLN COMPARISON:  11/01/2021 FINDINGS: CT CHEST FINDINGS Cardiovascular: Atherosclerotic calcifications of the thoracic aorta are noted. Ascending aorta demonstrates a normal caliber without aneurysmal dilatation. There is a type B dissection identified in the distal thoracic aortic arch and descending thoracic aorta. The true lumen is diminutive similar to that seen on the prior exam. Improved enhancement of the false lumen is seen although this is likely related to the timing of the contrast bolus. Heart is mildly enlarged in size. Central catheter is noted consistent with dialysis. Pulmonary artery is well visualized within normal branching pattern. The degree of enhancement is limited although no large central pulmonary embolus is seen. Mediastinum/Nodes: Thoracic inlet is within normal limits. No hilar or mediastinal adenopathy is noted. The esophagus as visualized is  within normal limits. Lungs/Pleura: Right lung demonstrates mild basilar atelectatic changes. Mild interstitial thickening is noted consistent with a degree of interstitial edema. This is similar to prior chest x-ray. Loculated left-sided pleural effusion is noted with considerable lower lobe consolidation. No nodular density is seen. Musculoskeletal: Bony structures show increased endplate sclerosis consistent with the underlying end-stage renal disease. No acute bony abnormality is seen. CT ABDOMEN PELVIS FINDINGS Hepatobiliary: No focal liver abnormality is seen. No gallstones,  gallbladder wall thickening, or biliary dilatation. Pancreas: Unremarkable. No pancreatic ductal dilatation or surrounding inflammatory changes. Spleen: Normal in size without focal abnormality. Adrenals/Urinary Tract: Adrenal glands are within normal limits. Kidneys demonstrate multiple cysts throughout the right kidney consistent with polycystic kidney disease. No follow-up is recommended. Left kidney is surgically absent. The bladder is decompressed. Stomach/Bowel: No obstructive or inflammatory changes of the colon are noted. The appendix is not well visualized. No inflammatory changes to suggest appendicitis are noted. Small bowel and stomach are within normal limits. Vascular/Lymphatic: The dissection flap seen in the descending thoracic aorta extends to just below the origin of the superior mesenteric artery similar to seen on the prior exam. The celiac axis and superior mesenteric artery as well as the right renal artery arise from the true lumen. The left renal artery is not visualized consistent with the surgical history. Stable penetrating ulcer is noted in the distal abdominal aorta. Iliac vessels show calcification without focal abnormality. IVC filter is noted in satisfactory position. Reproductive: Prostate is unremarkable. Other: No abdominal wall hernia or abnormality. No abdominopelvic ascites. Musculoskeletal: Diffuse soft tissue calcifications are noted related to arterial calcinosis. Endplate sclerosis is noted throughout the lumbar spine consistent with the given clinical history. IMPRESSION: Stable appearing type B aortic dissection as described arising just beyond the left subclavian artery and continuing to just below the origin of the SMA. Stable penetrating ulcer in the lateral aspect of the abdominal aorta on the right. New loculated large left-sided pleural effusion with underlying left lower lobe consolidation. Electronically Signed   By: Alcide Clever M.D.   On: 10/27/2022 17:40   DG  Chest Portable 1 View  Result Date: 10/27/2022 CLINICAL DATA:  Provided history: Left shoulder and left flank pain. Diminished lung sounds. EXAM: PORTABLE CHEST 1 VIEW COMPARISON:  CT angiogram chest 11/01/2021. Prior chest radiographs 06/06/2018 and earlier. FINDINGS: A right-sided central venous catheter terminates at the level of the right atrium. Cardiomegaly. Aortic atherosclerosis. Central pulmonary vascular congestion. Prominence of the interstitial lung markings consistent with interstitial edema. Redemonstrated bandlike focus of scarring within the right lung base. Opacities within the mid and lower left lung. Left pleural effusion. No evidence of pneumothorax. No acute osseous abnormality identified. Dextrocurvature of the thoracic spine. Vascular stent within the right axilla/upper extremity. IMPRESSION: 1. Cardiomegaly with central pulmonary vascular congestion and pulmonary interstitial edema. 2. Opacities within the mid and lower left lung, which may reflect atelectasis and/or airspace consolidation. 3. Left pleural effusion. 4. Aortic Atherosclerosis (ICD10-I70.0). Electronically Signed   By: Jackey Loge D.O.   On: 10/27/2022 13:19    Medications:  anticoagulant sodium citrate      amiodarone  200 mg Oral Daily   Chlorhexidine Gluconate Cloth  6 each Topical Daily   cinacalcet  180 mg Oral QPM   lidocaine  3 patch Transdermal Q24H   midodrine  10 mg Oral Q T,Th,Sa-HD   sevelamer carbonate  4.8 g Oral TID WC    Dialysis Orders: TTS [facility] 4hr, 2K/2Ca, UFP #4, TDC,  EDW 66kg, no heparin (allergy) - locks TDC with CaCitrate - No ESA or VDRA - Binder: Renvela pwdr 4.8g TID - Sensipar 180mg  every day at home   Assessment/Plan: Acute hypoxic respiratory failure: In setting of missed HD. CXR with pleural effusion. Still with LE edema which is chronic. Hyperkalemia: On admit, improved with HD. Loculated L pleural effusion: For thoracentesis with cultures (waiting for INR to improve).  Afebrile. + subjective chills. ESRD: Resume usual TTS schedule - HD today - UF as tolerated in setting of hypotension. BP/volume: BP variable, low today - adding mido pre-HD. Now well below prior EDW - lower on d/c. Anemia of ESRD:  Hgb 9.9 - will give low dose Aranesp today. Secondary HPTH: Ca/Phos ok - continue home binders + cinacalcet (dose adjusted) Nutrition: Alb low, adding supplements. A-fib: On amiodarone + warfarin (on hold for procedure), has IVC filter Hx type B aortic dissection s/p repair (2004)  Corey Hoyle, PA-C 10/29/2022, 8:30 AM  [facility]

## 2022-10-29 NOTE — Progress Notes (Signed)
PROGRESS NOTE    Corey [name]  UUV:[id number] DOB: Jun 17, 1970 DOA: 10/27/2022 PCP: [name], NP  Chief Complaint  Patient presents with   Flank Pain   Shoulder Pain    Brief Narrative:   Corey [name] is Grady Mohabir 52 y.o. male with medical history significant of end-stage renal disease on hemodialysis TTS, paroxysmal atrial fibrillation on chronic Coumadin therapy, history of previous aortic dissection, peripheral vascular disease, history of osteoarthritis, history of neuropathy, who missed hemodialysis twice secondary to nausea vomiting and diarrhea.  Patient came to the ER with shortness of breath. Was found to be hypoxic with new oxygen requirement.  Also significant hyperkalemia.  Nephrology was consulted and patient is being dialyzed.  As part of his workup patient was found to have complex right-sided pleural effusion that looks loculated.  There is worried that this represents possible infection versus other loculated pleural effusion.  Patient is therefore being admitted to the hospital.  Patient currently being dialyzed as indicated.   Assessment & Plan:   Principal Problem:   Pleural effusion Active Problems:   ESRD on dialysis [facility])   Essential hypertension   Chronic diastolic CHF (congestive heart failure) (HCC)   Atrial fibrillation, chronic (HCC)   End-stage renal disease on hemodialysis (HCC)   AAA (abdominal aortic aneurysm) without rupture (HCC)   Hyperkalemia  #1 large loculated left pleural effusion: Appreciate pulmonary following, awaiting improvement in INR (2.8 today)   #2 end-stage renal disease:  Appreciate renal following HD per renal    #3 hyperkalemia: dialysis per renal    #4 paroxysmal atrial fibrillation: Rate is now controlled.  Will hold warfarin in preparation for possible thoracentesis.   #5 chronic diastolic heart failure: volume per renal with dialysis  Echo 2020 with EF 60-65%, increased LV wall thickness   #6 essential hypertension:  BP now on soft side, will follow.  # 7 Chronic Type B Aortic Dissection  Thoracicoabdominal Aortic Aneurysm Following outpatient   # Underweight Body mass index is 16.97 kg/m.     DVT prophylaxis: inr 3.6, warfarin on hold Code Status: full Family Communication: noen Disposition:   Status is: Inpatient    Consultants:  Pulm nephrology  Procedures:  none  Antimicrobials:  Anti-infectives (From admission, onward)    None       Subjective: No new complaints Lidocaine patch helped with pain  Objective: Vitals:   10/29/22 1140 10/29/22 1146 10/29/22 1216 10/29/22 1649  BP: 99/64 103/68 106/66 96/62  Pulse: 69 69  70  Resp:  17  20  Temp:  97.6 F (36.4 C) 98.5 F (36.9 C) 98.5 F (36.9 C)  TempSrc:   Oral Oral  SpO2: 94% 100% 94% 94%  Weight:  61.6 kg    Height:        Intake/Output Summary (Last 24 hours) at 10/29/2022 1743 Last data filed at 10/29/2022 1146 Gross per 24 hour  Intake 118 ml  Output 1.5 ml  Net 116.5 ml   Filed Weights   10/27/22 2338 10/29/22 0826 10/29/22 1146  Weight: 62.9 kg 63.1 kg 61.6 kg    Examination:  General: No acute distress. Eating dinner. Lungs: unlabored Neurological: Alert and oriented 3. Moves all extremities 4 with equal strength. Cranial nerves II through XII grossly intact. Extremities: No clubbing or cyanosis. No edema.    Data Reviewed: I have personally reviewed following labs and imaging studies  CBC: Recent Labs  Lab 10/27/22 1302 10/27/22 1314 10/28/22 0104 10/29/22 0048 10/29/22 0830  WBC 11.5*  --  10.6* 11.0* 11.3*  NEUTROABS  --   --   --  9.2*  --   HGB 10.6* 10.9* 10.2* 9.9* 9.7*  HCT 33.9* 32.0* 33.0* 33.2* 33.0*  MCV 95.8  --  95.9 100.6* 99.7  PLT 209  --  187 191 185    Basic Metabolic Panel: Recent Labs  Lab 10/27/22 1302 10/27/22 1314 10/28/22 0104 10/29/22 0048  NA 142 139 137 138  K 6.0* 5.8* 4.1 4.8  CL 97* 101 95* 96*  CO2 27  --  26 29  GLUCOSE 95 89 94 105*   BUN 103* 96* 40* 54*  CREATININE 13.68* 13.60* 7.25* 8.68*  CALCIUM 9.4  --  9.0 8.2*  MG  --   --   --  2.0  PHOS  --   --   --  4.6    GFR: Estimated Creatinine Clearance: 8.7 mL/min (Rayner Erman) (by C-G formula based on SCr of 8.68 mg/dL (H)).  Liver Function Tests: Recent Labs  Lab 10/27/22 1302 10/28/22 0104 10/29/22 0048  AST 6* 5* 6*  ALT 7 5 7   ALKPHOS 259* 254* 220*  BILITOT 0.7 1.0 0.6  PROT 6.3* 6.2* 5.9*  ALBUMIN 2.4* 2.2* 2.1*    CBG: Recent Labs  Lab 10/28/22 0008  GLUCAP 60*     Recent Results (from the past 240 hour(s))  MRSA Next Gen by PCR, Nasal     Status: None   Collection Time: 10/28/22 12:03 AM   Specimen: Nasal Mucosa; Nasal Swab  Result Value Ref Range Status   MRSA by PCR Next Gen NOT DETECTED NOT DETECTED Final    Comment: (NOTE) The GeneXpert MRSA Assay (FDA approved for NASAL specimens only), is one component of Dyneshia Baccam comprehensive MRSA colonization surveillance program. It is not intended to diagnose MRSA infection nor to guide or monitor treatment for MRSA infections. Test performance is not FDA approved in patients less than 89 years old. Performed at [facility], [address] [place], [place] [address]          Radiology Studies: No results found.      Scheduled Meds:  (feeding supplement) PROSource Plus  30 mL Oral BID BM   amiodarone  200 mg Oral Daily   Chlorhexidine Gluconate Cloth  6 each Topical Daily   cinacalcet  180 mg Oral QPM   darbepoetin (ARANESP) injection - DIALYSIS  40 mcg Subcutaneous Q Sat-1800   lidocaine  3 patch Transdermal Q24H   midodrine       midodrine  10 mg Oral Q T,Th,Sa-HD   sevelamer carbonate  4.8 g Oral TID WC   Continuous Infusions:     LOS: 2 days    Time spent: over 30 min    Lacretia Nicks, MD [facility]   To contact the attending provider between 7A-7P or the covering provider during after hours 7P-7A, please log into the web site [email or web address] and access  using universal [place] password for that web site. If you do not have the password, please call the hospital operator.  10/29/2022, 5:43 PM

## 2022-10-30 DIAGNOSIS — J90 Pleural effusion, not elsewhere classified: Secondary | ICD-10-CM | POA: Diagnosis not present

## 2022-10-30 LAB — COMPREHENSIVE METABOLIC PANEL WITH GFR
ALT: 6 U/L (ref 0–44)
AST: 8 U/L — ABNORMAL LOW (ref 15–41)
Albumin: 2.6 g/dL — ABNORMAL LOW (ref 3.5–5.0)
Alkaline Phosphatase: 213 U/L — ABNORMAL HIGH (ref 38–126)
Anion gap: 12 (ref 5–15)
BUN: 31 mg/dL — ABNORMAL HIGH (ref 6–20)
CO2: 27 mmol/L (ref 22–32)
Calcium: 7.8 mg/dL — ABNORMAL LOW (ref 8.9–10.3)
Chloride: 94 mmol/L — ABNORMAL LOW (ref 98–111)
Creatinine, Ser: 5.76 mg/dL — ABNORMAL HIGH (ref 0.61–1.24)
GFR, Estimated: 11 mL/min — ABNORMAL LOW (ref >60)
Glucose, Bld: 105 mg/dL — ABNORMAL HIGH (ref 70–99)
Potassium: 4.1 mmol/L (ref 3.5–5.1)
Sodium: 133 mmol/L — ABNORMAL LOW (ref 135–145)
Total Bilirubin: 0.6 mg/dL (ref 0.3–1.2)
Total Protein: 6.1 g/dL — ABNORMAL LOW (ref 6.5–8.1)

## 2022-10-30 LAB — PROTIME-INR
INR: 2.7 — ABNORMAL HIGH (ref 0.8–1.2)
Prothrombin Time: 28.6 s — ABNORMAL HIGH (ref 11.4–15.2)

## 2022-10-30 LAB — CBC WITH DIFFERENTIAL/PLATELET
Abs Immature Granulocytes: 0.09 10*3/uL — ABNORMAL HIGH (ref 0.00–0.07)
Basophils Absolute: 0.1 10*3/uL (ref 0.0–0.1)
Basophils Relative: 0 %
Eosinophils Absolute: 0.1 10*3/uL (ref 0.0–0.5)
Eosinophils Relative: 1 %
HCT: 32.5 % — ABNORMAL LOW (ref 39.0–52.0)
Hemoglobin: 9.8 g/dL — ABNORMAL LOW (ref 13.0–17.0)
Immature Granulocytes: 1 %
Lymphocytes Relative: 6 %
Lymphs Abs: 0.8 10*3/uL (ref 0.7–4.0)
MCH: 30.5 pg (ref 26.0–34.0)
MCHC: 30.2 g/dL (ref 30.0–36.0)
MCV: 101.2 fL — ABNORMAL HIGH (ref 80.0–100.0)
Monocytes Absolute: 0.9 10*3/uL (ref 0.1–1.0)
Monocytes Relative: 7 %
Neutro Abs: 10.8 10*3/uL — ABNORMAL HIGH (ref 1.7–7.7)
Neutrophils Relative %: 85 %
Platelets: 195 10*3/uL (ref 150–400)
RBC: 3.21 MIL/uL — ABNORMAL LOW (ref 4.22–5.81)
RDW: 15 % (ref 11.5–15.5)
WBC: 12.7 10*3/uL — ABNORMAL HIGH (ref 4.0–10.5)
nRBC: 0 % (ref 0.0–0.2)

## 2022-10-30 LAB — PHOSPHORUS: Phosphorus: 3.6 mg/dL (ref 2.5–4.6)

## 2022-10-30 LAB — MAGNESIUM: Magnesium: 1.8 mg/dL (ref 1.7–2.4)

## 2022-10-30 MED ORDER — MIDODRINE HCL 5 MG PO TABS
10.0000 mg | ORAL_TABLET | Freq: Once | ORAL | Status: AC
  Administered 2022-10-31: 10 mg via ORAL
  Filled 2022-10-30: qty 2

## 2022-10-30 MED ORDER — POLYETHYLENE GLYCOL 3350 17 G PO PACK
17.0000 g | PACK | Freq: Every day | ORAL | Status: DC
  Administered 2022-10-31 – 2022-11-01 (×3): 17 g via ORAL
  Filled 2022-10-30 (×3): qty 1

## 2022-10-30 MED ORDER — BISACODYL 10 MG RE SUPP
10.0000 mg | Freq: Once | RECTAL | Status: AC
  Administered 2022-10-31: 10 mg via RECTAL
  Filled 2022-10-30: qty 1

## 2022-10-30 MED ORDER — SODIUM CHLORIDE 0.9 % IV BOLUS: 250.0000 mL | Freq: Once | INTRAVENOUS | Status: DC

## 2022-10-30 MED ORDER — SODIUM CHLORIDE 0.9 % IV BOLUS
250.0000 mL | Freq: Once | INTRAVENOUS | Status: AC
  Administered 2022-10-30: 250 mL via INTRAVENOUS

## 2022-10-30 NOTE — Progress Notes (Signed)
[facility] Progress Note   Subjective:  Seen in room - breathing a little better. HD yesterday - 1.3L removed. BP low today. His edema has resolved. Still waiting on INR to come down for thoracentesis - ?maybe tomorrow.   Objective Vitals:   10/29/22 1649 10/29/22 1908 10/29/22 2321 10/30/22 0333  BP: 96/62 (!) 96/55 (!) 91/54 (!) 93/58  Pulse: 70     Resp: 20  17   Temp: 98.5 F (36.9 C) 99 F (37.2 C) 98.9 F (37.2 C) 98.9 F (37.2 C)  TempSrc: Oral Oral Oral Oral  SpO2: 94%     Weight:      Height:       Physical Exam General: Chronically ill appearing man, NAD. Nasal O2 in place Heart: RRR; no murmur Lungs: Dull L base, otherwise clear Abdomen: soft, mild TTP in L abdomen Extremities: No LE edema today, less tender TTP  Dialysis Access: [facility]   Additional Objective Labs: Basic Metabolic Panel: Recent Labs  Lab 10/28/22 0104 10/29/22 0048 10/30/22 0052  NA 137 138 133*  K 4.1 4.8 4.1  CL 95* 96* 94*  CO2 26 29 27   GLUCOSE 94 105* 105*  BUN 40* 54* 31*  CREATININE 7.25* 8.68* 5.76*  CALCIUM 9.0 8.2* 7.8*  PHOS  --  4.6 3.6   Liver Function Tests: Recent Labs  Lab 10/28/22 0104 10/29/22 0048 10/30/22 0052  AST 5* 6* 8*  ALT 5 7 6   ALKPHOS 254* 220* 213*  BILITOT 1.0 0.6 0.6  PROT 6.2* 5.9* 6.1*  ALBUMIN 2.2* 2.1* 2.6*   Recent Labs  Lab 10/27/22 1302  LIPASE 33   CBC: Recent Labs  Lab 10/27/22 1302 10/27/22 1314 10/28/22 0104 10/29/22 0048 10/29/22 0830 10/30/22 0052  WBC 11.5*  --  10.6* 11.0* 11.3* 12.7*  NEUTROABS  --   --   --  9.2*  --  10.8*  HGB 10.6*   < > 10.2* 9.9* 9.7* 9.8*  HCT 33.9*   < > 33.0* 33.2* 33.0* 32.5*  MCV 95.8  --  95.9 100.6* 99.7 101.2*  PLT 209  --  187 191 185 195   < > = values in this interval not displayed.   Medications:   (feeding supplement) PROSource Plus  30 mL Oral BID BM   amiodarone  200 mg Oral Daily   Chlorhexidine Gluconate Cloth  6 each Topical Daily   cinacalcet  180 mg  Oral QPM   darbepoetin (ARANESP) injection - DIALYSIS  40 mcg Subcutaneous Q Sat-1800   lidocaine  3 patch Transdermal Q24H   midodrine  10 mg Oral Q T,Th,Sa-HD   sevelamer carbonate  4.8 g Oral TID WC    Dialysis Orders: TTS [facility] 4hr, 2K/2Ca, UFP #4, TDC, EDW 66kg, no heparin (allergy) - locks TDC with CaCitrate - No ESA or VDRA - Binder: Renvela pwdr 4.8g TID - Sensipar 180mg  every day at home   Assessment/Plan: Acute hypoxic respiratory failure: In setting of missed HD. CXR with pleural effusion. LE now resolved. Hyperkalemia: On admit, corrected with HD. Loculated L pleural effusion: For thoracentesis with cultures (waiting for INR to improve). Afebrile. + subjective chills. WBC rising - may end up being infectious. ESRD: Continue usual TTS schedule - next HD 7/30. BP/volume: BP low, edema resolved and now well below prior EDW - likely got him a little too dry. Encouraged regular diet and should slowly come up. If becomes symptomatic - ok for small IVF bolus. Does not appear  septic. Anemia of ESRD:  Hgb 9.8 - s/p Aranesp on 7/27. Secondary HPTH: Ca/Phos ok - continue home binders + cinacalcet (dose adjusted) Nutrition: Alb low, continue supplements. A-fib: On amiodarone + warfarin (on hold for procedure), has IVC filter Hx type B aortic dissection s/p repair (2004)    Ozzie Hoyle, PA-C 10/30/2022, 10:13 AM  [facility]

## 2022-10-30 NOTE — Plan of Care (Signed)

## 2022-10-30 NOTE — Progress Notes (Signed)
PROGRESS NOTE    Corey [name]  MVH:[id number] DOB: June 29, 1970 DOA: 10/27/2022 PCP: [name], NP  Chief Complaint  Patient presents with   Flank Pain   Shoulder Pain    Brief Narrative:   Corey [name] is Corey [name] 52 y.o. male with medical history significant of end-stage renal disease on hemodialysis TTS, paroxysmal atrial fibrillation on chronic Coumadin therapy, history of previous aortic dissection, peripheral vascular disease, history of osteoarthritis, history of neuropathy, who missed hemodialysis twice secondary to nausea vomiting and diarrhea.  Patient came to the ER with shortness of breath. Was found to be hypoxic with new oxygen requirement.  Also significant hyperkalemia.  Nephrology was consulted and patient is being dialyzed.  As part of his workup patient was found to have complex right-sided pleural effusion that looks loculated.  There is worried that this represents possible infection versus other loculated pleural effusion.  Patient is therefore being admitted to the hospital.  Patient currently being dialyzed as indicated.   Assessment & Plan:   Principal Problem:   Pleural effusion Active Problems:   ESRD on dialysis [facility])   Essential hypertension   Chronic diastolic CHF (congestive heart failure) (HCC)   Atrial fibrillation, chronic (HCC)   End-stage renal disease on hemodialysis (HCC)   AAA (abdominal aortic aneurysm) without rupture (HCC)   Hyperkalemia  #1 large loculated left pleural effusion: Appreciate pulmonary following, awaiting improvement in INR (2.7 today)   #2 end-stage renal disease:  Appreciate renal following HD per renal    #3 hyperkalemia: resolved Per renal    #4 paroxysmal atrial fibrillation: Rate is now controlled.  Will hold warfarin in preparation for possible thoracentesis.   #5 chronic diastolic heart failure: volume per renal with dialysis  Echo 2020 with EF 60-65%, increased LV wall thickness   #6 essential hypertension:  BP now on soft side, will follow.  # 7 Chronic Type B Aortic Dissection  Thoracicoabdominal Aortic Aneurysm Following outpatient   # Underweight Body mass index is 16.97 kg/m.     DVT prophylaxis: inr 3.6, warfarin on hold Code Status: full Family Communication: noen Disposition:   Status is: Inpatient    Consultants:  Pulm nephrology  Procedures:  none  Antimicrobials:  Anti-infectives (From admission, onward)    None       Subjective: Denies complaints today  Objective: Vitals:   10/29/22 1908 10/29/22 2321 10/30/22 0333 10/30/22 0712  BP: (!) 96/55 (!) 91/54 (!) 93/58 (!) 93/57  Pulse:    67  Resp:  17  18  Temp: 99 F (37.2 C) 98.9 F (37.2 C) 98.9 F (37.2 C) 98.5 F (36.9 C)  TempSrc: Oral Oral Oral Oral  SpO2:    95%  Weight:      Height:        Intake/Output Summary (Last 24 hours) at 10/30/2022 1420 Last data filed at 10/30/2022 0900 Gross per 24 hour  Intake 238 ml  Output --  Net 238 ml   Filed Weights   10/27/22 2338 10/29/22 0826 10/29/22 1146  Weight: 62.9 kg 63.1 kg 61.6 kg    Examination:  General: No acute distress. Cardiovascular: RRR TTP to L side Lungs: unlabored Neurological: Alert and oriented 3. Moves all extremities 4 with equal strength. Cranial nerves II through XII grossly intact. Extremities: No clubbing or cyanosis. No edema.  Data Reviewed: I have personally reviewed following labs and imaging studies  CBC: Recent Labs  Lab 10/27/22 1302 10/27/22 1314 10/28/22 0104 10/29/22 0048  10/29/22 0830 10/30/22 0052  WBC 11.5*  --  10.6* 11.0* 11.3* 12.7*  NEUTROABS  --   --   --  9.2*  --  10.8*  HGB 10.6* 10.9* 10.2* 9.9* 9.7* 9.8*  HCT 33.9* 32.0* 33.0* 33.2* 33.0* 32.5*  MCV 95.8  --  95.9 100.6* 99.7 101.2*  PLT 209  --  187 191 185 195    Basic Metabolic Panel: Recent Labs  Lab 10/27/22 1302 10/27/22 1314 10/28/22 0104 10/29/22 0048 10/30/22 0052  NA 142 139 137 138 133*  K 6.0* 5.8* 4.1  4.8 4.1  CL 97* 101 95* 96* 94*  CO2 27  --  26 29 27   GLUCOSE 95 89 94 105* 105*  BUN 103* 96* 40* 54* 31*  CREATININE 13.68* 13.60* 7.25* 8.68* 5.76*  CALCIUM 9.4  --  9.0 8.2* 7.8*  MG  --   --   --  2.0 1.8  PHOS  --   --   --  4.6 3.6    GFR: Estimated Creatinine Clearance: 13.1 mL/min (Korinna Tat) (by C-G formula based on SCr of 5.76 mg/dL (H)).  Liver Function Tests: Recent Labs  Lab 10/27/22 1302 10/28/22 0104 10/29/22 0048 10/30/22 0052  AST 6* 5* 6* 8*  ALT 7 5 7 6   ALKPHOS 259* 254* 220* 213*  BILITOT 0.7 1.0 0.6 0.6  PROT 6.3* 6.2* 5.9* 6.1*  ALBUMIN 2.4* 2.2* 2.1* 2.6*    CBG: Recent Labs  Lab 10/28/22 0008  GLUCAP 60*     Recent Results (from the past 240 hour(s))  MRSA Next Gen by PCR, Nasal     Status: None   Collection Time: 10/28/22 12:03 AM   Specimen: Nasal Mucosa; Nasal Swab  Result Value Ref Range Status   MRSA by PCR Next Gen NOT DETECTED NOT DETECTED Final    Comment: (NOTE) The GeneXpert MRSA Assay (FDA approved for NASAL specimens only), is one component of Margee Trenth[place] comprehensive MRSA colonization surveillance program. It is not intended to diagnose MRSA infection nor to guide or monitor treatment for MRSA infections. Test performance is not FDA approved in patients less than 47 years old. Performed at [facility], [address] [place], [place] [address]          Radiology Studies: No results found.      Scheduled Meds:  (feeding supplement) PROSource Plus  30 mL Oral BID BM   amiodarone  200 mg Oral Daily   Chlorhexidine Gluconate Cloth  6 each Topical Daily   cinacalcet  180 mg Oral QPM   darbepoetin (ARANESP) injection - DIALYSIS  40 mcg Subcutaneous Q Sat-1800   lidocaine  3 patch Transdermal Q24H   midodrine  10 mg Oral Q T,Th,Sa-HD   sevelamer carbonate  4.8 g Oral TID WC   Continuous Infusions:     LOS: 3 days    Time spent: over 30 min    Lacretia Nicks, MD [facility]   To contact the  attending provider between 7A-7P or the covering provider during after hours 7P-7A, please log into the web site [email or web address] and access using universal [place] password for that web site. If you do not have the password, please call the hospital operator.  10/30/2022, 2:20 PM

## 2022-10-30 NOTE — Consult Note (Signed)
ASSESSMENT & PLAN   PERIPHERAL ARTERIAL DISEASE: This patient likely has chronic infrainguinal arterial occlusive disease.  He has chronic skin changes bilaterally which are very advanced.  He is nonambulatory and is confined to a wheelchair.  He is not having any significant symptoms currently.  He is on Coumadin which is therapeutic.  This is a chronic issue and I would address his more pressing issues for now.  He would benefit from some bedside debridement of this thickened skin.  Currently I do not think there is any indication for arteriography as this would not currently change his overall clinical picture.  In addition this would be associated with some risk given that he has a penetrating ulcer in his distal infrarenal aorta and we would have to crossover the bifurcation adjacent to this ulcer for any type of intervention.  Vascular surgery will follow.  I will order the ABIs.  REASON FOR CONSULT:    Peripheral arterial disease consult is requested by Dr. Lowell Guitar.  HPI:   Corey [name] is a 52 y.o. male who was admitted 3 days ago with flank pain and shoulder pain.  He was last seen by our practice on 12/10/2020 with abdominal pain and a known aortic dissection.  This patient has a known type a aortic dissection that was repaired in [place] and has been followed by the thoracic surgeons.  He had some aneurysmal disease of the descending thoracic aorta which measured 5 cm at that time.  At that time he was unsure if he would want to pursue surgery if the aneurysm continued to increase in size.  His past medical history is significant for end-stage renal disease.  He dialyzes on Tuesdays Thursdays and Saturdays.  He has paroxysmal atrial fibrillation and is on chronic Coumadin therapy.  He has a history of a previous aortic dissection.  The patient has chronic thickening of the skin in both legs with evidence of peripheral arterial disease.  Vascular surgery was consulted tonight for further  evaluation.  I do not get any history of claudication.  He is nonambulatory he is confined to a wheelchair.  He denies any history of rest pain or nonhealing wounds.  Past Medical History:  Diagnosis Date   Arthritis    hands and shoulders   Blindness and low vision    "Stargardt disease"   Dissection of aorta (HCC) 2004   a. s/p extensive repeair in 2004 in [place] complicated by ESRD, lower extremity paralysis, coma, and extended hospitalization of 2 years   ESRD (end stage renal disease) (HCC)    a. TTS   Headache    History of cardioversion 2014   Hypertension    Neuropathy    Non-healing non-surgical wound 03/2016   PAF (paroxysmal atrial fibrillation) (HCC)    a. s/p DCCV in 2014; b. on Coumadin; c. CHADS2VASc => 2 (HTN, vascular disease)   Paralysis (HCC)    due to dissection of aorta in 2004, lower extremities   Pneumonia     Family History  Problem Relation Age of Onset   Cancer Mother    Hypertension Mother    Cancer Father    Hypertension Father    Thyroid disease Sister    Stroke Brother        11    SOCIAL HISTORY: Social History   Tobacco Use   Smoking status: Never   Smokeless tobacco: Never  Substance Use Topics   Alcohol use: No    Allergies  Allergen Reactions   Ciprofloxacin Other (See Comments)    Aortic dissection   Heparin Other (See Comments)    UNSPECIFIED REACTION :  On Coumadin since 2004   HIT panel negative 01/19/17   Doxercalciferol Other (See Comments)   Quinolones Other (See Comments)    unknown    Current Facility-Administered Medications  Medication Dose Route Frequency Provider Last Rate Last Admin   (feeding supplement) PROSource Plus liquid 30 mL  30 mL Oral BID BM Julien Nordmann, PA-C   30 mL at 10/29/22 1412   acetaminophen (TYLENOL) tablet 650 mg  650 mg Oral Q6H PRN Zigmund Daniel., MD   650 mg at 10/30/22 1722   amiodarone (PACERONE) tablet 200 mg  200 mg Oral Daily Rometta Emery, MD   200 mg at  10/30/22 1047   Chlorhexidine Gluconate Cloth 2 % PADS 6 each  6 each Topical Daily Rometta Emery, MD   6 each at 10/30/22 1048   cinacalcet (SENSIPAR) tablet 180 mg  180 mg Oral QPM Julien Nordmann, PA-C   180 mg at 10/30/22 1723   Darbepoetin Alfa (ARANESP) injection 40 mcg  40 mcg Subcutaneous Q Sat-1800 Julien Nordmann, PA-C   40 mcg at 10/29/22 1920   HYDROmorphone (DILAUDID) injection 0.5 mg  0.5 mg Intravenous Q3H PRN Zigmund Daniel., MD       lidocaine (LIDODERM) 5 % 3 patch  3 patch Transdermal Q24H Zigmund Daniel., MD   3 patch at 10/30/22 1725   midodrine (PROAMATINE) tablet 10 mg  10 mg Oral Q T,Th,Sa-HD Julien Nordmann, PA-C   10 mg at 10/29/22 1411   ondansetron (ZOFRAN) tablet 4 mg  4 mg Oral Q6H PRN Rometta Emery, MD       Or   ondansetron (ZOFRAN) injection 4 mg  4 mg Intravenous Q6H PRN Rometta Emery, MD       oxyCODONE (Oxy IR/ROXICODONE) immediate release tablet 5 mg  5 mg Oral Q6H PRN Zigmund Daniel., MD   5 mg at 10/28/22 1538   sevelamer carbonate (RENVELA) powder PACK 4.8 g  4.8 g Oral TID WC Earlie Lou L, MD   4.8 g at 10/30/22 1726    REVIEW OF SYSTEMS:  [X]  denotes positive finding, [ ]  denotes negative finding Cardiac  Comments:  Chest pain or chest pressure:    Shortness of breath upon exertion:    Short of breath when lying flat:    Irregular heart rhythm:        Vascular    Pain in calf, thigh, or hip brought on by ambulation:    Pain in feet at night that wakes you up from your sleep:     Blood clot in your veins:    Leg swelling:         Pulmonary    Oxygen at home:    Productive cough:     Wheezing:         Neurologic    Sudden weakness in arms or legs:     Sudden numbness in arms or legs:     Sudden onset of difficulty speaking or slurred speech:    Temporary loss of vision in one eye:     Problems with dizziness:         Gastrointestinal    Blood in stool:     Vomited blood:          Genitourinary  Burning when urinating:     Blood in urine:        Psychiatric    Major depression:         Hematologic    Bleeding problems:    Problems with blood clotting too easily:        Skin    Rashes or ulcers: x       Constitutional    Fever or chills:    -  PHYSICAL EXAM:   Vitals:   10/30/22 0712 10/30/22 1619 10/30/22 1833 10/30/22 1928  BP: (!) 93/57 97/61 97/62  94/60  Pulse: 67 64 66 64  Resp: 18 (!) 24 17 20   Temp: 98.5 F (36.9 C) 98.7 F (37.1 C)  98.3 F (36.8 C)  TempSrc: Oral Oral  Oral  SpO2: 95% 99% 95% 94%  Weight:      Height:       Body mass index is 16.97 kg/m. GENERAL: The patient is a well-nourished male, in no acute distress. The vital signs are documented above. CARDIAC: There is a regular rate and rhythm.  VASCULAR: I do not detect carotid bruits. He has a diminished right femoral pulse with a normal left femoral pulse. He has extremely thickened skin bilaterally so it is difficult to assess his Doppler signals.  The only Doppler signal I can get is a right dorsalis pedis which is monophasic. PULMONARY: There is good air exchange bilaterally without wheezing or rales. ABDOMEN: Soft and non-tender with normal pitched bowel sounds.  MUSCULOSKELETAL: There are no major deformities. NEUROLOGIC: No focal weakness or paresthesias are detected. SKIN: He appears to have evidence of chronic venous insufficiency with severe hyperpigmentation and hyperkeratosis.    [name] PSYCHIATRIC: The patient has a normal affect.  DATA:    CT ANGIO CHEST ABDOMEN PELVIS: This shows stable appearance of this type B aortic dissection.  This arises just beyond the left subclavian artery and continues to just below the origin of the superior mesenteric artery.  There is a penetrating ulcer along the lateral aspect of the abdominal aorta on the right which is not new and is unchanged in size compared to the previous studies.  Waverly [facility]

## 2022-10-31 ENCOUNTER — Ambulatory Visit (HOSPITAL_COMMUNITY)
Admission: RE | Admit: 2022-10-31 | Discharge: 2022-10-31 | Disposition: A | Discharge status: Home / Self Care | Payer: 59 | Source: Ambulatory Visit | Attending: Cardiology | Admitting: Cardiology

## 2022-10-31 ENCOUNTER — Inpatient Hospital Stay (HOSPITAL_COMMUNITY): Payer: 59

## 2022-10-31 ENCOUNTER — Telehealth: Payer: Self-pay | Admitting: Vascular Surgery

## 2022-10-31 DIAGNOSIS — I7090 Unspecified atherosclerosis: Secondary | ICD-10-CM | POA: Diagnosis not present

## 2022-10-31 DIAGNOSIS — J90 Pleural effusion, not elsewhere classified: Secondary | ICD-10-CM | POA: Diagnosis not present

## 2022-10-31 MED ORDER — HYDROCERIN EX CREA
TOPICAL_CREAM | Freq: Every day | CUTANEOUS | Status: DC
  Filled 2022-10-31 (×4): qty 113

## 2022-10-31 MED ORDER — PHYTONADIONE 5 MG PO TABS
2.5000 mg | ORAL_TABLET | Freq: Once | ORAL | Status: AC
  Administered 2022-10-31: 2.5 mg via ORAL
  Filled 2022-10-31: qty 1

## 2022-10-31 NOTE — Progress Notes (Signed)
PROGRESS NOTE    Corey [name]  ZOX:[id number] DOB: Oct 18, 1970 DOA: 10/27/2022 PCP: [name], NP  Chief Complaint  Patient presents with   Flank Pain   Shoulder Pain    Brief Narrative:   Corey [name] is Corey [name] 52 y.o. male with medical history significant of end-stage renal disease on hemodialysis TTS, paroxysmal atrial fibrillation on chronic Coumadin therapy, history of previous aortic dissection, peripheral vascular disease, history of osteoarthritis, history of neuropathy, who missed hemodialysis twice secondary to nausea vomiting and diarrhea.  Patient came to the ER with shortness of breath. Was found to be hypoxic with new oxygen requirement.  Also significant hyperkalemia.  Nephrology was consulted and patient is being dialyzed.  As part of his workup patient was found to have complex right-sided pleural effusion that looks loculated.  There is worried that this represents possible infection versus other loculated pleural effusion.  Patient is therefore being admitted to the hospital.  Patient currently being dialyzed as indicated.   Assessment & Plan:   Principal Problem:   Pleural effusion Active Problems:   ESRD on dialysis [facility])   Essential hypertension   Chronic diastolic CHF (congestive heart failure) (HCC)   Atrial fibrillation, chronic (HCC)   End-stage renal disease on hemodialysis (HCC)   AAA (abdominal aortic aneurysm) without rupture (HCC)   Hyperkalemia  #1 large loculated left pleural effusion: Appreciate pulmonary following, awaiting improvement in INR (2.5 today - will give dose of vitamin K)   #2 end-stage renal disease:  Appreciate renal following HD per renal    # Hypotension BP on soft side yesterday, complaints of dizziness Improved after 250 cc bolus Will follow, continue to hold BP meds Midodrine on dialysis days  # Peripheral Vascular Disease Appreciate vascular assistance, ABI's pending  #3 hyperkalemia: resolved Per renal    #4  paroxysmal atrial fibrillation: Rate is now controlled.  Amiodarone.  Will hold warfarin in preparation for possible thoracentesis.   #5 chronic diastolic heart failure: volume per renal with dialysis  Echo 2020 with EF 60-65%, increased LV wall thickness   #6 essential hypertension: BP now on soft side, will follow.  He's not apparently taking his metop at home.  # 7 Chronic Type B Aortic Dissection  Thoracicoabdominal Aortic Aneurysm Following outpatient   # Underweight Body mass index is 16.97 kg/m.     DVT prophylaxis: inr 3.6, warfarin on hold Code Status: full Family Communication: noen Disposition:   Status is: Inpatient    Consultants:  Pulm nephrology  Procedures:  none  Antimicrobials:  Anti-infectives (From admission, onward)    None       Subjective: Feels about the same  Objective: Vitals:   10/30/22 2339 10/31/22 0300 10/31/22 0408 10/31/22 0838  BP: (!) 84/57  (!) 90/55 107/61  Pulse: 60 (!) 59 61 (!) 59  Resp: 20  17 19   Temp: 97.8 F (36.6 C)  98.4 F (36.9 C) 97.8 F (36.6 C)  TempSrc: Oral  Oral Oral  SpO2: 100%  98% 96%  Weight:      Height:        Intake/Output Summary (Last 24 hours) at 10/31/2022 0855 Last data filed at 10/30/2022 1800 Gross per 24 hour  Intake 454.76 ml  Output --  Net 454.76 ml   Filed Weights   10/27/22 2338 10/29/22 0826 10/29/22 1146  Weight: 62.9 kg 63.1 kg 61.6 kg    Examination:  General: No acute distress. Lungs: unlabored Abdomen: Soft, nontender, nondistended Neurological:  Alert and oriented 3. Moves all extremities 4 with equal strength. Cranial nerves II through XII grossly intact. Extremities: chronic changes to bilateral legs, thickened skin - DP pulse palpable to R foot, not palpable or dopperable on L (7/28)   Data Reviewed: I have personally reviewed following labs and imaging studies  CBC: Recent Labs  Lab 10/28/22 0104 10/29/22 0048 10/29/22 0830 10/30/22 0052  10/31/22 0056  WBC 10.6* 11.0* 11.3* 12.7* 11.1*  NEUTROABS  --  9.2*  --  10.8* 9.5*  HGB 10.2* 9.9* 9.7* 9.8* 9.8*  HCT 33.0* 33.2* 33.0* 32.5* 32.7*  MCV 95.9 100.6* 99.7 101.2* 100.0  PLT 187 191 185 195 190    Basic Metabolic Panel: Recent Labs  Lab 10/27/22 1302 10/27/22 1314 10/28/22 0104 10/29/22 0048 10/30/22 0052 10/31/22 0056  NA 142 139 137 138 133* 135  K 6.0* 5.8* 4.1 4.8 4.1 4.2  CL 97* 101 95* 96* 94* 95*  CO2 27  --  26 29 27 26   GLUCOSE 95 89 94 105* 105* 92  BUN 103* 96* 40* 54* 31* 43*  CREATININE 13.68* 13.60* 7.25* 8.68* 5.76* 7.08*  CALCIUM 9.4  --  9.0 8.2* 7.8* 7.0*  MG  --   --   --  2.0 1.8 1.9  PHOS  --   --   --  4.6 3.6 3.6    GFR: Estimated Creatinine Clearance: 10.6 mL/min (Ewel Lona) (by C-G formula based on SCr of 7.08 mg/dL (H)).  Liver Function Tests: Recent Labs  Lab 10/27/22 1302 10/28/22 0104 10/29/22 0048 10/30/22 0052 10/31/22 0056  AST 6* 5* 6* 8* 6*  ALT 7 5 7 6 8   ALKPHOS 259* 254* 220* 213* 205*  BILITOT 0.7 1.0 0.6 0.6 0.7  PROT 6.3* 6.2* 5.9* 6.1* 6.1*  ALBUMIN 2.4* 2.2* 2.1* 2.6* 2.4*    CBG: Recent Labs  Lab 10/28/22 0008  GLUCAP 60*     Recent Results (from the past 240 hour(s))  MRSA Next Gen by PCR, Nasal     Status: None   Collection Time: 10/28/22 12:03 AM   Specimen: Nasal Mucosa; Nasal Swab  Result Value Ref Range Status   MRSA by PCR Next Gen NOT DETECTED NOT DETECTED Final    Comment: (NOTE) The GeneXpert MRSA Assay (FDA approved for NASAL specimens only), is one component of Loys [place]elton comprehensive MRSA colonization surveillance program. It is not intended to diagnose MRSA infection nor to guide or monitor treatment for MRSA infections. Test performance is not FDA approved in patients less than 27 years old. Performed at [facility], [address], [place], [place] [address]          Radiology Studies: No results found.      Scheduled Meds:  (feeding supplement) PROSource Plus  30  mL Oral BID BM   amiodarone  200 mg Oral Daily   Chlorhexidine Gluconate Cloth  6 each Topical Daily   cinacalcet  180 mg Oral QPM   darbepoetin (ARANESP) injection - DIALYSIS  40 mcg Subcutaneous Q Sat-1800   lidocaine  3 patch Transdermal Q24H   midodrine  10 mg Oral Q T,Th,Sa-HD   polyethylene glycol  17 g Oral Daily   sevelamer carbonate  4.8 g Oral TID WC   Continuous Infusions:     LOS: 4 days    Time spent: over 30 min    Lacretia Nicks, MD [facility]   To contact the attending provider between 7A-7P or the covering provider during  after hours 7P-7A, please log into the web site [email or web address] and access using universal [place] password for that web site. If you do not have the password, please call the hospital operator.  10/31/2022, 8:55 AM

## 2022-10-31 NOTE — Progress Notes (Signed)
  [facility] Progress Note   Subjective:  Seen in room - pain on inspiration, low PO intake.  No new complaints.   Objective Vitals:   10/30/22 2339 10/31/22 0300 10/31/22 0408 10/31/22 0838  BP: (!) 84/57  (!) 90/55 107/61  Pulse: 60 (!) 59 61 (!) 59  Resp: 20  17 19   Temp: 97.8 F (36.6 C)  98.4 F (36.9 C) 97.8 F (36.6 C)  TempSrc: Oral  Oral Oral  SpO2: 100%  98% 96%  Weight:      Height:       Physical Exam General: Chronically ill appearing man, NAD. Nasal O2 in place Heart: RRR; no murmur Lungs: Dull L base, otherwise clear Abdomen: soft, mild TTP in L abdomen Extremities: chronic woody changes without pitting edema Dialysis Access: [facility]  c/d/i  Additional Objective Labs: Basic Metabolic Panel: Recent Labs  Lab 10/29/22 0048 10/30/22 0052 10/31/22 0056  NA 138 133* 135  K 4.8 4.1 4.2  CL 96* 94* 95*  CO2 29 27 26   GLUCOSE 105* 105* 92  BUN 54* 31* 43*  CREATININE 8.68* 5.76* 7.08*  CALCIUM 8.2* 7.8* 7.0*  PHOS 4.6 3.6 3.6   Liver Function Tests: Recent Labs  Lab 10/29/22 0048 10/30/22 0052 10/31/22 0056  AST 6* 8* 6*  ALT 7 6 8   ALKPHOS 220* 213* 205*  BILITOT 0.6 0.6 0.7  PROT 5.9* 6.1* 6.1*  ALBUMIN 2.1* 2.6* 2.4*   Recent Labs  Lab 10/27/22 1302  LIPASE 33   CBC: Recent Labs  Lab 10/28/22 0104 10/29/22 0048 10/29/22 0830 10/30/22 0052 10/31/22 0056  WBC 10.6* 11.0* 11.3* 12.7* 11.1*  NEUTROABS  --  9.2*  --  10.8* 9.5*  HGB 10.2* 9.9* 9.7* 9.8* 9.8*  HCT 33.0* 33.2* 33.0* 32.5* 32.7*  MCV 95.9 100.6* 99.7 101.2* 100.0  PLT 187 191 185 195 190   Medications:   (feeding supplement) PROSource Plus  30 mL Oral BID BM   amiodarone  200 mg Oral Daily   Chlorhexidine Gluconate Cloth  6 each Topical Daily   cinacalcet  180 mg Oral QPM   darbepoetin (ARANESP) injection - DIALYSIS  40 mcg Subcutaneous Q Sat-1800   lidocaine  3 patch Transdermal Q24H   midodrine  10 mg Oral Q T,Th,Sa-HD   polyethylene glycol  17 g  Oral Daily   sevelamer carbonate  4.8 g Oral TID WC    Dialysis Orders: TTS [facility] 4hr, 2K/2Ca, UFP #4, TDC, EDW 66kg, no heparin (allergy) - locks TDC with CaCitrate - No ESA or VDRA - Binder: Renvela pwdr 4.8g TID - Sensipar 180mg  every day at home   Assessment/Plan: Acute hypoxic respiratory failure: In setting of missed HD. CXR with pleural effusion - awaiting thora. LE now resolved. Loculated L pleural effusion: For thoracentesis with cultures (waiting for INR to improve). Afebrile.  ESRD: Continue usual TTS schedule - next HD 7/30. BP/volume: BP low, edema resolved and now well below prior EDW. Anemia of ESRD:  Hgb 9.8 - s/p Aranesp on 7/27. Secondary HPTH: Ca/Phos ok - continue home binders + cinacalcet (dose adjusted) Nutrition: Alb low, continue supplements. A-fib: On amiodarone + warfarin (on hold for procedure), has IVC filter Hx type B aortic dissection s/p repair (2004)   Estill Bakes MD [facility] Pager [phone]

## 2022-10-31 NOTE — Progress Notes (Signed)
Transported to vascular lab  by bed awake and alert.

## 2022-10-31 NOTE — Progress Notes (Signed)
ABI study completed.  ° °Please see CV Proc for preliminary results.  ° °Rachel Hodge, RDMS, RVT ° °

## 2022-10-31 NOTE — Consult Note (Addendum)
WOC Nurse Consult Note: Reason for Consult: Consult requested for bilat legs.  He has been assessed by the vascular team and has generalized loose scaley dried skin with a few patchy areas of partial thickness wounds where the skin has cracked.   There is not whirlpool or hydrotherapy available to remove skin in the acute care setting. He has very long overgrown curving nails and there is no podiatry inpatient service provided for this condition.  Left anterior foot partial thickness wound .3X.3X.1cm, pink and moist Right outer foot partial thickness wound .5X.5X.1cm, pink and moist Dressing procedure/placement/frequency: Topical treatment orders provided for bedside nurses to perform as follows: 1. Apply Eucerin cream to bilat legs Q day after bathing and roughly towel drying to assist with removal of loose skin 2. Foam dressing to any partial thickness wounds, change Q 3 days or PRN soiling. Please re-consult if further assistance is needed.  Thank-you,  Cammie Mcgee MSN, RN, CWOCN, [place], CNS [phone]

## 2022-10-31 NOTE — Progress Notes (Signed)
Bilateral  lower extremities cleansed with soap and water and dried with towel. Eucerin cream applied all over with some  black skin flakes falling off. Foam dressing applied  to some open sores.

## 2022-10-31 NOTE — Progress Notes (Signed)
NAME:  Corey [name], MRN:  [id number], DOB:  08-30-1970, LOS: 4 ADMISSION DATE:  10/27/2022, CONSULTATION DATE:  10/28/2022 REFERRING MD:  Roney Mans, CHIEF COMPLAINT:  pleural effusion.   History of Present Illness:  52 year old man who is on hemodialysis with a left pleural effusion. He has had general decline in the state of health over the last several months with loss of appetite. He had a relatively abrupt onset of left chest discomfort which she like into a sensation of trapped gas in the left side with a pleuritic component.  Mild cough and somewhat increased shortness of breath.  Some subjective fever and chills.  CT shows a moderate to large loculated left pleural effusion.  Pertinent  Medical History   Past Medical History:  Diagnosis Date   Arthritis    hands and shoulders   Blindness and low vision    "Stargardt disease"   Dissection of aorta (HCC) 2004   a. s/p extensive repeair in 2004 in [place] complicated by ESRD, lower extremity paralysis, coma, and extended hospitalization of 2 years   ESRD (end stage renal disease) (HCC)    a. TTS   Headache    History of cardioversion 2014   Hypertension    Neuropathy    Non-healing non-surgical wound 03/2016   PAF (paroxysmal atrial fibrillation) (HCC)    a. s/p DCCV in 2014; b. on Coumadin; c. CHADS2VASc => 2 (HTN, vascular disease)   Paralysis (HCC)    due to dissection of aorta in 2004, lower extremities   Pneumonia      Significant Hospital Events: Including procedures, antibiotic start and stop dates in addition to other pertinent events   7/25 - CT shows loculated left pleural effusion with associated consolidation.  No evidence of lymphadenopathy or mass. 7/28 No acute issues overnight, remains on 3L [place]. INR remains elevated at 2.5, Vitamin K given. Tentative plan to place chest tube tomorrow   Interim History / Subjective:  Seen lying in bed with no acute complaints  States he is very fearful of needles and  request antianxiety medication prior to procedure   Objective   Blood pressure 98/62, pulse 62, temperature 97.8 F (36.6 C), temperature source Oral, resp. rate 17, height 6\' 3"  (1.905 m), weight 61.6 kg, SpO2 93%.        Intake/Output Summary (Last 24 hours) at 10/31/2022 1412 Last data filed at 10/30/2022 1800 Gross per 24 hour  Intake 284.76 ml  Output --  Net 284.76 ml   Filed Weights   10/27/22 2338 10/29/22 0826 10/29/22 1146  Weight: 62.9 kg 63.1 kg 61.6 kg    Examination: General: Acute on chronically ill appearing elderly male lying in bed, in NAD HEENT: [place]/AT, MM pink/moist, PERRL,  Neuro: Alert and oriented x3, non-focal  CV: s1s2 regular rate and rhythm, no murmur, rubs, or gallops,  PULM:  Diminished left base, no increased work of breathing, no added breath sounds  GI: soft, bowel sounds active in all 4 quadrants, non-tender, non-distended, tolerating oral diet Extremities: warm/dry, no edema  Skin: no rashes or lesions  Resolved Hospital Problem list     Assessment & Plan:  Loculated left pleural effusion -Given his symptomatology it appears that effusion is relatively acute. Differentials include hemothorax vs parapneumonic vs malignant effusion  End-stage renal disease on hemodialysis Unexplained weight loss Calciphylaxis P: Continue to trend INR now s/p vitamin K Tentative plan for chest tube placement 7/30 Follow fluid studies including cytology and culture once obtained  Continue supplemental oxygen  Mobilize as able   Best Practice (right click and "Reselect all SmartList Selections" daily)  Per primary   Critical care time: NA  Tunis Gentle D. Harris, NP-C [facility] Personal contact information can be found on Amion  If no contact or response made please call [phone] 10/31/2022, 2:15 PM

## 2022-10-31 NOTE — Telephone Encounter (Signed)
-----   Message from Waverly Ferrari sent at 10/31/2022  9:17 AM EDT ----- Regarding: charge and f/u Level 1 follow-up visit. He needs a follow-up CT angio of the chest abdomen pelvis in 1 year and a office visit with Dr. Karin Lieu who is seen him before.  Thank you. CD

## 2022-10-31 NOTE — Plan of Care (Signed)

## 2022-10-31 NOTE — Progress Notes (Addendum)
Progress Note  VASCULAR SURGERY ASSESSMENT & PLAN:   PERIPHERAL ARTERIAL DISEASE: He has evidence of infrainguinal arterial occlusive disease.  I have ordered ABIs to have as a baseline.  Currently I see no indication for arteriography as this would not significantly change things.  This patient is nonambulatory.  I have discussed the option of bilateral amputations if this worsens.  He is not interested in this option currently.  CHRONIC VENOUS INSUFFICIENCY: Given his hyperpigmentation and hyperkeratosis of the skin in both legs I suspect he has significant chronic venous insufficiency.  He is nonambulatory and given that he has some underlying peripheral arterial disease I do not think he can aggressively elevate his legs or use significant compression which might further compromise his circulation.  This leads local wound care.  I did try to remove some of the necrotic skin at the bedside today but he did not tolerate this too well.  I will consult the wound care team to see if they have any recommendations.  He might benefit from whirlpools to try to remove some of the necrotic skin.  CHRONIC TYPE B AORTIC DISSECTION: This is unchanged on his recent CT scan.  I have ordered a follow-up CT angiogram of the chest abdomen and pelvis in 1 year and he will be seen in our office at that time.  Cari Caraway, MD 9:16 AM    10/31/2022 6:58 AM Hospital Day 4  Subjective:  says he has a little pain in his legs  Afebrile   Vitals:   10/31/22 0300 10/31/22 0408  BP:  (!) 90/55  Pulse: (!) 59 61  Resp:  17  Temp:  98.4 F (36.9 C)  SpO2:  98%    Physical Exam: General:  no distress Lungs:  non labored   CBC    Component Value Date/Time   WBC 11.1 (H) 10/31/2022 0056   RBC 3.27 (L) 10/31/2022 0056   HGB 9.8 (L) 10/31/2022 0056   HGB 11.4 (L) 08/31/2022 1137   HCT 32.7 (L) 10/31/2022 0056   HCT 34.7 (L) 08/31/2022 1137   PLT 190 10/31/2022 0056   PLT 135 (L) 08/31/2022 1137    MCV 100.0 10/31/2022 0056   MCV 93 08/31/2022 1137   MCH 30.0 10/31/2022 0056   MCHC 30.0 10/31/2022 0056   RDW 14.9 10/31/2022 0056   RDW 13.4 08/31/2022 1137   LYMPHSABS 0.7 10/31/2022 0056   LYMPHSABS 1.0 02/13/2017 1425   MONOABS 0.7 10/31/2022 0056   EOSABS 0.1 10/31/2022 0056   EOSABS 0.1 02/13/2017 1425   BASOSABS 0.0 10/31/2022 0056   BASOSABS 0.0 02/13/2017 1425    BMET    Component Value Date/Time   NA 135 10/31/2022 0056   NA 143 08/31/2022 1137   K 4.2 10/31/2022 0056   CL 95 (L) 10/31/2022 0056   CO2 26 10/31/2022 0056   GLUCOSE 92 10/31/2022 0056   BUN 43 (H) 10/31/2022 0056   BUN 57 (H) 08/31/2022 1137   CREATININE 7.08 (H) 10/31/2022 0056   CALCIUM 7.0 (L) 10/31/2022 0056   GFRNONAA 9 (L) 10/31/2022 0056   GFRAA 5 (L) 09/04/2019 1114    INR    Component Value Date/Time   INR 2.5 (H) 10/31/2022 0056     Intake/Output Summary (Last 24 hours) at 10/31/2022 0658 Last data filed at 10/30/2022 1800 Gross per 24 hour  Intake 454.76 ml  Output --  Net 454.76 ml     Assessment/Plan:  52 y.o. male with PAD  Hospital Day 4  -pt with chronic skin changes that are advanced  and most likely has chronic infrainguinal arterial occlusive disease.  He is non ambulatory and confined to wheelchair.  ABI's have been ordered and are pending.  Dr. Edilia Bo to make further recommendations once those have been completed.   -pt is on coumadin-INR today is 2.5   Doreatha Massed, PA-C [facility] [phone] 10/31/2022 6:58 AM

## 2022-10-31 NOTE — Care Management Important Message (Signed)
Important Message  Patient Details  Name: Corey [name] MRN: [id number] Date of Birth: May 28, 1970   Medicare Important Message Given:  Yes     [name] Stefan Church 10/31/2022, 3:53 PM

## 2022-10-31 NOTE — TOC Initial Note (Signed)
Transition of Care [facility]) - Initial/Assessment Note    Patient Details  Name: Corey [name] MRN: [id number] Date of Birth: 1970-06-07  Transition of Care [facility] CM/SW Contact:    Harriet Masson, RN Phone Number: 10/31/2022, 2:00 PM  Clinical Narrative:                  Spoke to patient regarding transition needs.  Patient lives with brother who is also disabled.  Patient uses [place] access to get to apts. Patient's cousin get patient groceries. Food bank information added to AVS. Patient is requesting help at home. PCS aide form started and awaiting MD to sign and then will email to [employer] to start process. Address, Phone number and PCP verified. Expected Discharge Plan: Home w Home Health Services Barriers to Discharge: Continued Medical Work up   Patient Goals and CMS Choice Patient states their goals for this hospitalization and ongoing recovery are:: return home          Expected Discharge Plan and Services       Living arrangements for the past 2 months: Single Family Home                                      Prior Living Arrangements/Services Living arrangements for the past 2 months: Single Family Home Lives with:: Siblings Patient language and need for interpreter reviewed:: Yes Do you feel safe going back to the place where you live?: Yes      Need for Family Participation in Patient Care: Yes (Comment) Care giver support system in place?: Yes (comment) Current home services: DME (wheelchair) Criminal Activity/Legal Involvement Pertinent to Current Situation/Hospitalization: No - Comment as needed  Activities of Daily Living Home Assistive Devices/Equipment: Wheelchair ADL Screening (condition at time of admission) Patient's cognitive ability adequate to safely complete daily activities?: Yes Is the patient deaf or have difficulty hearing?: No Does the patient have difficulty seeing, even when wearing glasses/contacts?: Yes Does the patient have  difficulty concentrating, remembering, or making decisions?: No Patient able to express need for assistance with ADLs?: Yes Does the patient have difficulty dressing or bathing?: Yes Independently performs ADLs?: No Communication: Independent Dressing (OT): Needs assistance Is this a change from baseline?: Pre-admission baseline Grooming: Independent Feeding: Independent Bathing: Independent with device (comment) Toileting: Independent with device (comment), Independent In/Out Bed: Needs assistance Is this a change from baseline?: Pre-admission baseline Walks in Home: Needs assistance Is this a change from baseline?: Pre-admission baseline Does the patient have difficulty walking or climbing stairs?: Yes Weakness of Legs: Both Weakness of Arms/Hands: None  Permission Sought/Granted                  Emotional Assessment Appearance:: Appears older than stated age Attitude/Demeanor/Rapport: Gracious Affect (typically observed): Accepting Orientation: : Oriented to Self, Oriented to Place, Oriented to  Time, Oriented to Situation Alcohol / Substance Use: Not Applicable Psych Involvement: No (comment)  Admission diagnosis:  Pleural effusion [J90] Patient Active Problem List   Diagnosis Date Noted   Pleural effusion 10/27/2022   Hyperkalemia 10/27/2022   AAA (abdominal aortic aneurysm) without rupture (HCC) 12/18/2020   Dissection of thoracic aorta (HCC)    Essential hypertension    Chronic diastolic CHF (congestive heart failure) (HCC)    Atrial fibrillation, chronic (HCC)    End-stage renal disease on hemodialysis (HCC)    Thrombocytopenia (HCC)    Peripheral neuropathy  Thoracoabdominal aortic aneurysm (TAAA) without rupture (HCC)    Chest pain 01/18/2017   Long term (current) use of anticoagulants [Z79.01] 12/30/2016   Calciphylaxis 05/06/2016   Arm wound, left, sequela 04/28/2016   Nonhealing surgical wound 04/13/2016   ESRD on dialysis (HCC) 02/19/2016   PCP:   [name], NP Pharmacy:   [facility] [id number], [place] - [address] [place] [address] Phone: [phone] Fax: [phone]     Social Determinants of Health (SDOH) Social History: SDOH Screenings   Food Insecurity: No Food Insecurity (10/27/2022)  Housing: Medium Risk (10/27/2022)  Transportation Needs: No Transportation Needs (10/27/2022)  Utilities: Not At Risk (10/27/2022)  Alcohol Screen: Low Risk  (09/26/2022)  Depression (PHQ2-9): Medium Risk (09/26/2022)  Financial Resource Strain: Medium Risk (09/26/2022)  Physical Activity: Inactive (09/26/2022)  Social Connections: Socially Isolated (09/26/2022)  Stress: No Stress Concern Present (09/26/2022)  Tobacco Use: Low Risk  (10/27/2022)   SDOH Interventions:     Readmission Risk Interventions     No data to display

## 2022-10-31 NOTE — Telephone Encounter (Signed)
Pt is still in the hospital. 

## 2022-11-01 DIAGNOSIS — J90 Pleural effusion, not elsewhere classified: Secondary | ICD-10-CM | POA: Diagnosis not present

## 2022-11-01 LAB — COMPREHENSIVE METABOLIC PANEL WITH GFR
ALT: 7 U/L (ref 0–44)
AST: 6 U/L — ABNORMAL LOW (ref 15–41)
Albumin: 2.4 g/dL — ABNORMAL LOW (ref 3.5–5.0)
Alkaline Phosphatase: 209 U/L — ABNORMAL HIGH (ref 38–126)
Anion gap: 16 — ABNORMAL HIGH (ref 5–15)
BUN: 52 mg/dL — ABNORMAL HIGH (ref 6–20)
CO2: 23 mmol/L (ref 22–32)
Calcium: 6.3 mg/dL — CL (ref 8.9–10.3)
Chloride: 96 mmol/L — ABNORMAL LOW (ref 98–111)
Creatinine, Ser: 8.47 mg/dL — ABNORMAL HIGH (ref 0.61–1.24)
GFR, Estimated: 7 mL/min — ABNORMAL LOW (ref >60)
Glucose, Bld: 95 mg/dL (ref 70–99)
Potassium: 4.8 mmol/L (ref 3.5–5.1)
Sodium: 135 mmol/L (ref 135–145)
Total Bilirubin: 0.4 mg/dL (ref 0.3–1.2)
Total Protein: 6.2 g/dL — ABNORMAL LOW (ref 6.5–8.1)

## 2022-11-01 LAB — PROTIME-INR
INR: 2.4 — ABNORMAL HIGH (ref 0.8–1.2)
Prothrombin Time: 26.5 s — ABNORMAL HIGH (ref 11.4–15.2)

## 2022-11-01 MED ORDER — PHYTONADIONE 5 MG PO TABS
2.5000 mg | ORAL_TABLET | Freq: Once | ORAL | Status: AC
  Administered 2022-11-01: 2.5 mg via ORAL
  Filled 2022-11-01: qty 1

## 2022-11-01 MED ORDER — POLYETHYLENE GLYCOL 3350 17 G PO PACK
17.0000 g | PACK | Freq: Two times a day (BID) | ORAL | Status: DC
  Administered 2022-11-01 – 2022-11-29 (×25): 17 g via ORAL
  Filled 2022-11-01 (×38): qty 1

## 2022-11-01 MED ORDER — ANTICOAGULANT SODIUM CITRATE 4% (200MG/5ML) IV SOLN
5.0000 mL | Freq: Once | Status: AC
  Administered 2022-11-01: 3.8 mL
  Filled 2022-11-01: qty 3.8

## 2022-11-01 NOTE — Progress Notes (Signed)
[facility] Progress Note   Subjective:  Seen at dialysis.  Cont pain on inspiration, low PO intake.  No new complaints.  INR remains 2.4 today --> plan is chest tube when < 2.    Objective Vitals:   10/31/22 2340 11/01/22 0300 11/01/22 0744 11/01/22 0810  BP: 92/67 96/60 114/67 106/66  Pulse: (!) 59 (!) 59 63 65  Resp: 20 20 17 20   Temp: 98 F (36.7 C) 98 F (36.7 C) (!) 97.4 F (36.3 C)   TempSrc: Oral Oral Oral   SpO2: 100% 95% 96% 96%  Weight:   63.6 kg   Height:       Physical Exam General: Chronically ill appearing man, NAD. Nasal O2 in place Heart: RRR; no murmur Lungs: Dull L base, otherwise clear Abdomen: soft, mild TTP in L abdomen Extremities: chronic woody changes without pitting edema Dialysis Access: [facility]  c/d/i  Additional Objective Labs: Basic Metabolic Panel: Recent Labs  Lab 10/30/22 0052 10/31/22 0056 11/01/22 0139  NA 133* 135 135  K 4.1 4.2 4.8  CL 94* 95* 96*  CO2 27 26 23   GLUCOSE 105* 92 95  BUN 31* 43* 52*  CREATININE 5.76* 7.08* 8.47*  CALCIUM 7.8* 7.0* 6.3*  PHOS 3.6 3.6 3.7   Liver Function Tests: Recent Labs  Lab 10/30/22 0052 10/31/22 0056 11/01/22 0139  AST 8* 6* 6*  ALT 6 8 7   ALKPHOS 213* 205* 209*  BILITOT 0.6 0.7 0.4  PROT 6.1* 6.1* 6.2*  ALBUMIN 2.6* 2.4* 2.4*   Recent Labs  Lab 10/27/22 1302  LIPASE 33   CBC: Recent Labs  Lab 10/29/22 0048 10/29/22 0830 10/30/22 0052 10/31/22 0056 11/01/22 0139  WBC 11.0* 11.3* 12.7* 11.1* 11.3*  NEUTROABS 9.2*  --  10.8* 9.5* 10.0*  HGB 9.9* 9.7* 9.8* 9.8* 9.7*  HCT 33.2* 33.0* 32.5* 32.7* 32.6*  MCV 100.6* 99.7 101.2* 100.0 97.3  PLT 191 185 195 190 208   Medications:   (feeding supplement) PROSource Plus  30 mL Oral BID BM   amiodarone  200 mg Oral Daily   Chlorhexidine Gluconate Cloth  6 each Topical Daily   cinacalcet  180 mg Oral QPM   darbepoetin (ARANESP) injection - DIALYSIS  40 mcg Subcutaneous Q Sat-1800   hydrocerin   Topical Daily    lidocaine  3 patch Transdermal Q24H   midodrine  10 mg Oral Q T,Th,Sa-HD   phytonadione  2.5 mg Oral Once   polyethylene glycol  17 g Oral Daily   sevelamer carbonate  4.8 g Oral TID WC    Dialysis Orders: TTS [facility] 4hr, 2K/2Ca, UFP #4, TDC, EDW 66kg, no heparin (allergy) - locks TDC with CaCitrate - No ESA or VDRA - Binder: Renvela pwdr 4.8g TID - Sensipar 180mg  every day at home   Assessment/Plan: Acute hypoxic respiratory failure: In setting of missed HD. CXR with pleural effusion - awaiting thora. LE now resolved. Loculated L pleural effusion: For thoracentesis with cultures (waiting for INR to trend < 2). Afebrile.  ESRD: Continue usual TTS schedule - HD today.  BP/volume: BP low, edema resolved and now well below prior EDW.  Low volume UF today and continue to trend new EDW.  Anemia of ESRD:  Hgb 9s - s/p Aranesp on 7/27. Secondary HPTH: Ca/Phos ok - continue home binders.  Held cinacalcet due to hypoCa - consider resuming at lower dose (was 180 daily) if Ca improves. Nutrition: Alb low, continue supplements. A-fib: On amiodarone + warfarin (  on hold for procedure), has IVC filter Hx type B aortic dissection s/p repair (2004)   Estill Bakes MD [facility] Pager [phone]

## 2022-11-01 NOTE — Progress Notes (Addendum)
  Progress Note    11/01/2022 6:48 AM Hospital Day 5  Subjective:  pt sleeping and I did not wake him  afebrile  Vitals:   10/31/22 2340 11/01/22 0300  BP: 92/67 96/60  Pulse: (!) 59 (!) 59  Resp: 20 20  Temp: 98 F (36.7 C) 98 F (36.7 C)  SpO2: 100% 95%    Physical Exam: General:  no distress Lungs:  non labored   CBC    Component Value Date/Time   WBC 11.3 (H) 11/01/2022 0139   RBC 3.35 (L) 11/01/2022 0139   HGB 9.7 (L) 11/01/2022 0139   HGB 11.4 (L) 08/31/2022 1137   HCT 32.6 (L) 11/01/2022 0139   HCT 34.7 (L) 08/31/2022 1137   PLT 208 11/01/2022 0139   PLT 135 (L) 08/31/2022 1137   MCV 97.3 11/01/2022 0139   MCV 93 08/31/2022 1137   MCH 29.0 11/01/2022 0139   MCHC 29.8 (L) 11/01/2022 0139   RDW 14.8 11/01/2022 0139   RDW 13.4 08/31/2022 1137   LYMPHSABS 0.5 (L) 11/01/2022 0139   LYMPHSABS 1.0 02/13/2017 1425   MONOABS 0.6 11/01/2022 0139   EOSABS 0.1 11/01/2022 0139   EOSABS 0.1 02/13/2017 1425   BASOSABS 0.0 11/01/2022 0139   BASOSABS 0.0 02/13/2017 1425    BMET    Component Value Date/Time   NA 135 11/01/2022 0139   NA 143 08/31/2022 1137   K 4.8 11/01/2022 0139   CL 96 (L) 11/01/2022 0139   CO2 23 11/01/2022 0139   GLUCOSE 95 11/01/2022 0139   BUN 52 (H) 11/01/2022 0139   BUN 57 (H) 08/31/2022 1137   CREATININE 8.47 (H) 11/01/2022 0139   CALCIUM 6.3 (LL) 11/01/2022 0139   GFRNONAA 7 (L) 11/01/2022 0139   GFRAA 5 (L) 09/04/2019 1114    INR    Component Value Date/Time   INR 2.4 (H) 11/01/2022 0139     Intake/Output Summary (Last 24 hours) at 11/01/2022 0648 Last data filed at 10/31/2022 1700 Gross per 24 hour  Intake 202 ml  Output --  Net 202 ml     Assessment/Plan:  52 y.o. male with mixed PAD/chronic venous insufficiency  Hospital Day 5  -pt sleeping this morning and I did not wake him. -his ABI are non compressible.  Continue current care with Eucerin  -chronic type B aortic dissection-unchanged on recent CT.  He will  f/u in one year with CTA c/a/p in our office.    Doreatha Massed, PA-C [facility] [phone] 11/01/2022 6:48 AM  I have interviewed the patient and examined the patient. I agree with the findings by the PA.  I consulted the wound care team for further recommendations but I do not see that they have seen the patient.  I am in the office tomorrow but would try to reconsult them if they have not seen the patient.  Cari Caraway, MD

## 2022-11-01 NOTE — Plan of Care (Signed)
  Problem: Education: Goal: Knowledge of General Education information will improve Description: Including pain rating scale, medication(s)/side effects and non-pharmacologic comfort measures Outcome: Progressing   Problem: Health Behavior/Discharge Planning: Goal: Ability to manage health-related needs will improve Outcome: Progressing   Problem: Clinical Measurements: Goal: Will remain free from infection Outcome: Progressing Goal: Diagnostic test results will improve Outcome: Progressing Goal: Respiratory complications will improve Outcome: Progressing Goal: Cardiovascular complication will be avoided Outcome: Progressing   Problem: Activity: Goal: Risk for activity intolerance will decrease Outcome: Progressing   Problem: Nutrition: Goal: Adequate nutrition will be maintained Outcome: Progressing   Problem: Coping: Goal: Level of anxiety will decrease Outcome: Progressing   Problem: Elimination: Goal: Will not experience complications related to bowel motility Outcome: Progressing   Problem: Pain Managment: Goal: General experience of comfort will improve Outcome: Progressing   Problem: Safety: Goal: Ability to remain free from injury will improve Outcome: Progressing   Problem: Skin Integrity: Goal: Risk for impaired skin integrity will decrease Outcome: Progressing

## 2022-11-01 NOTE — Progress Notes (Signed)
Transported to Hemodialysis unit by bed awake and alert.

## 2022-11-01 NOTE — Progress Notes (Signed)
Received patient awake and alert.   Access used: right catheter Duration: 2hr 13 minutes Fluid removed: -0.1 UF was off at the start due to BP low. Pt stated felt dizzy.  Hemo comment: Pt refused to finish treatment. Stated stomach was hurting and he wanted to go to his room.

## 2022-11-01 NOTE — Progress Notes (Signed)
Frequently ask for bedpan with small amount of brown formed stool. On miralax but he feels constipated. Md made aware. Continue to monitor.

## 2022-11-01 NOTE — Progress Notes (Signed)
Critical care  INR still too high for chest tube placement.  Given another dose of vitamin K.  Will recheck tomorrow hopefully will be able to proceed then.  Lynnell Catalan, MD [facility] ICU Physician [facility]  Pager: [phone] Or Epic Secure Chat After hours: [phone].  11/01/2022, 2:49 PM

## 2022-11-01 NOTE — Progress Notes (Signed)
PROGRESS NOTE    Corey [name]  MWU:[id number] DOB: 1970/06/13 DOA: 10/27/2022 PCP: [name], NP  Chief Complaint  Patient presents with   Flank Pain   Shoulder Pain    Brief Narrative:   Corey [name] is Corey [name] 52 y.o. male with medical history significant of end-stage renal disease on hemodialysis TTS, paroxysmal atrial fibrillation on chronic Coumadin therapy, history of previous aortic dissection, peripheral vascular disease, history of osteoarthritis, history of neuropathy, who missed hemodialysis twice secondary to nausea vomiting and diarrhea.  Patient came to the ER with shortness of breath. Was found to be hypoxic with new oxygen requirement.  Also significant hyperkalemia.  Nephrology was consulted and patient is being dialyzed.  As part of his workup patient was found to have complex right-sided pleural effusion that looks loculated.  There is worried that this represents possible infection versus other loculated pleural effusion.  Patient is therefore being admitted to the hospital.  Patient currently being dialyzed as indicated.   Currently awaiting improvement in INR for chest tube placement.   Vascular was consulted for PVD.    Assessment & Plan:   Principal Problem:   Pleural effusion Active Problems:   ESRD on dialysis [facility])   Essential hypertension   Chronic diastolic CHF (congestive heart failure) (HCC)   Atrial fibrillation, chronic (HCC)   End-stage renal disease on hemodialysis (HCC)   AAA (abdominal aortic aneurysm) without rupture (HCC)   Hyperkalemia  #1 large loculated left pleural effusion  Acute Hypoxic Respiratory Failure: Requiring 4 L by [place], wean as tolerated Appreciate pulmonary following, awaiting improvement in INR (2.4 today - vitamin K ordered again today)   #2 end-stage renal disease:  Appreciate renal following HD per renal  Cinacalcet on hold due to hypoca per renal, appreciate assistance    # Hypotension BP fluctuating, has  required intermittent bolus Midodrine on dialysis days  # Peripheral Vascular Disease # Bilateral Lower Extremities with Chronic Skin Changes related to Vascular Disease Appreciate vascular assistance, ABI's noncompressible --- recommending eucerin daily Needs outpatient vascular follow up   # Constipation Miralax BID Enema prn   #3 hyperkalemia: resolved Per renal    #4 paroxysmal atrial fibrillation: Rate is now controlled.  Amiodarone.  Will hold warfarin in preparation for possible thoracentesis.   #5 chronic diastolic heart failure: volume per renal with dialysis  Echo 2020 with EF 60-65%, increased LV wall thickness   #6 essential hypertension: BP now on soft side, will follow.  He's not apparently taking his metop at home.  # 7 Chronic Type B Aortic Dissection  Thoracicoabdominal Aortic Aneurysm Following outpatient   # Underweight Body mass index is 17.36 kg/m.     DVT prophylaxis: inr 3.6, warfarin on hold Code Status: full Family Communication: noen Disposition:   Status is: Inpatient    Consultants:  Pulm nephrology  Procedures:  none  Antimicrobials:  Anti-infectives (From admission, onward)    None       Subjective: C/o feeling like he has to have BM, constipated, abdominal discomfort  Objective: Vitals:   11/01/22 1030 11/01/22 1035 11/01/22 1159 11/01/22 1554  BP: 120/62 130/81 109/66 102/62  Pulse: 68 66 65 67  Resp: 20 (!) 22 20 (!) 21  Temp:    (!) 97.3 F (36.3 C)  TempSrc: Oral  Oral Oral  SpO2: 97% 98% 99% 95%  Weight: 63 kg     Height:        Intake/Output Summary (Last 24 hours) at  11/01/2022 1934 Last data filed at 11/01/2022 1035 Gross per 24 hour  Intake --  Output -0.1 ml  Net 0.1 ml   Filed Weights   10/29/22 1146 11/01/22 0744 11/01/22 1030  Weight: 61.6 kg 63.6 kg 63 kg    Examination:  General: No acute distress. Cardiovascular: RRR Lungs: unlabored on 4 L [place] Abdomen: Soft, nontender, nondistended   Neurological: Alert and oriented 3. Moves all extremities 4 with equal strength. Cranial nerves II through XII grossly intact. Extremities: No edema.  Data Reviewed: I have personally reviewed following labs and imaging studies  CBC: Recent Labs  Lab 10/29/22 0048 10/29/22 0830 10/30/22 0052 10/31/22 0056 11/01/22 0139  WBC 11.0* 11.3* 12.7* 11.1* 11.3*  NEUTROABS 9.2*  --  10.8* 9.5* 10.0*  HGB 9.9* 9.7* 9.8* 9.8* 9.7*  HCT 33.2* 33.0* 32.5* 32.7* 32.6*  MCV 100.6* 99.7 101.2* 100.0 97.3  PLT 191 185 195 190 208    Basic Metabolic Panel: Recent Labs  Lab 10/28/22 0104 10/29/22 0048 10/30/22 0052 10/31/22 0056 11/01/22 0139  NA 137 138 133* 135 135  K 4.1 4.8 4.1 4.2 4.8  CL 95* 96* 94* 95* 96*  CO2 26 29 27 26 23   GLUCOSE 94 105* 105* 92 95  BUN 40* 54* 31* 43* 52*  CREATININE 7.25* 8.68* 5.76* 7.08* 8.47*  CALCIUM 9.0 8.2* 7.8* 7.0* 6.3*  MG  --  2.0 1.8 1.9 1.9  PHOS  --  4.6 3.6 3.6 3.7    GFR: Estimated Creatinine Clearance: 9.1 mL/min (Curry Dulski) (by C-G formula based on SCr of 8.47 mg/dL (H)).  Liver Function Tests: Recent Labs  Lab 10/28/22 0104 10/29/22 0048 10/30/22 0052 10/31/22 0056 11/01/22 0139  AST 5* 6* 8* 6* 6*  ALT 5 7 6 8 7   ALKPHOS 254* 220* 213* 205* 209*  BILITOT 1.0 0.6 0.6 0.7 0.4  PROT 6.2* 5.9* 6.1* 6.1* 6.2*  ALBUMIN 2.2* 2.1* 2.6* 2.4* 2.4*    CBG: Recent Labs  Lab 10/28/22 0008  GLUCAP 60*     Recent Results (from the past 240 hour(s))  MRSA Next Gen by PCR, Nasal     Status: None   Collection Time: 10/28/22 12:03 AM   Specimen: Nasal Mucosa; Nasal Swab  Result Value Ref Range Status   MRSA by PCR Next Gen NOT DETECTED NOT DETECTED Final    Comment: (NOTE) The GeneXpert MRSA Assay (FDA approved for NASAL specimens only), is one component of [facility] comprehensive MRSA colonization surveillance program. It is not intended to diagnose MRSA infection nor to guide or monitor treatment for MRSA infections. Test performance is  not FDA approved in patients less than 7 years old. Performed at [facility], [address] [place], [place] [address]          Radiology Studies: VAS [place] ABI WITH/WO TBI  Result Date: 10/31/2022  LOWER EXTREMITY DOPPLER STUDY Patient Name:  ABRUM HENNINGTON  Date of Exam:   10/31/2022 Medical Rec #: [id number]     Accession #:    [id number] Date of Birth: April 04, 1971     Patient Gender: M Patient Age:   67 years Exam Location:  [facility] Procedure:      VAS [place] ABI WITH/WO TBI Referring Phys: Cristal Deer DICKSON --------------------------------------------------------------------------------  Indications: Peripheral artery disease. High Risk Factors: Hypertension. Other Factors: TAAA without rupture, AAA, ESRD.  Limitations: Today's exam was limited due to skin changes. Technically difficult  examination. Performing Technologist: Jean Rosenthal RDMS RVT  Examination Guidelines: Xzaria Teo complete evaluation includes at minimum, Doppler waveform signals and systolic blood pressure reading at the level of bilateral brachial, anterior tibial, and posterior tibial arteries, when vessel segments are accessible. Bilateral testing is considered an integral part of Artemis Koller complete examination. Photoelectric Plethysmograph (PPG) waveforms and toe systolic pressure readings are included as required and additional duplex testing as needed. Limited examinations for reoccurring indications may be performed as noted.  ABI Findings: +---------+------------------+-----+----------+--------------------------------+ Right    Rt Pressure (mmHg)IndexWaveform  Comment                          +---------+------------------+-----+----------+--------------------------------+ Brachial                        triphasic History of AVFs- unable to                                                 obtain pressure                  +---------+------------------+-----+----------+--------------------------------+  PTA                             monophasic                                 +---------+------------------+-----+----------+--------------------------------+ DP                              monophasic                                 +---------+------------------+-----+----------+--------------------------------+ Great Toe                                 Unable to obtain signal                                                    secondary to toenail overgrowth  +---------+------------------+-----+----------+--------------------------------+ +---------+------------------+-----+------------------+------------------------+ Left     Lt Pressure (mmHg)IndexWaveform          Comment                  +---------+------------------+-----+------------------+------------------------+ Brachial 101                    triphasic                                  +---------+------------------+-----+------------------+------------------------+ PTA                             audibly monophasicPoor signal secondary to  overlying skin                                                             thickening               +---------+------------------+-----+------------------+------------------------+ DP                              monophasic                                 +---------+------------------+-----+------------------+------------------------+ Great Toe                                         Unable to obtain signal                                                    secondary to toenail                                                       overgrowth               +---------+------------------+-----+------------------+------------------------+ Arterial wall calcification precludes accurate ankle pressures and ABIs.  Summary: Right: Resting right ankle-brachial index indicates noncompressible right lower  extremity arteries. Left: Resting left ankle-brachial index indicates noncompressible left lower extremity arteries. *See table(s) above for measurements and observations.  Electronically signed by Gerarda Fraction on 10/31/2022 at 6:01:15 PM.    Final         Scheduled Meds:  (feeding supplement) PROSource Plus  30 mL Oral BID BM   amiodarone  200 mg Oral Daily   Chlorhexidine Gluconate Cloth  6 each Topical Daily   darbepoetin (ARANESP) injection - DIALYSIS  40 mcg Subcutaneous Q Sat-1800   hydrocerin   Topical Daily   lidocaine  3 patch Transdermal Q24H   midodrine  10 mg Oral Q T,Th,Sa-HD   polyethylene glycol  17 g Oral BID   sevelamer carbonate  4.8 g Oral TID WC   Continuous Infusions:     LOS: 5 days    Time spent: over 30 min    Lacretia Nicks, MD [facility]   To contact the attending provider between 7A-7P or the covering provider during after hours 7P-7A, please log into the web site [email or web address] and access using universal [place] password for that web site. If you do not have the password, please call the hospital operator.  11/01/2022, 7:34 PM

## 2022-11-02 ENCOUNTER — Encounter (HOSPITAL_COMMUNITY)
Admission: EM | Disposition: A | Discharge status: Rehabilitation Facility | Payer: Self-pay | Source: Home / Self Care | Attending: Student

## 2022-11-02 ENCOUNTER — Encounter (HOSPITAL_COMMUNITY): Payer: Self-pay | Admitting: Internal Medicine

## 2022-11-02 ENCOUNTER — Inpatient Hospital Stay (HOSPITAL_COMMUNITY): Payer: 59

## 2022-11-02 DIAGNOSIS — Z992 Dependence on renal dialysis: Secondary | ICD-10-CM

## 2022-11-02 DIAGNOSIS — I714 Abdominal aortic aneurysm, without rupture, unspecified: Secondary | ICD-10-CM

## 2022-11-02 DIAGNOSIS — I482 Chronic atrial fibrillation, unspecified: Secondary | ICD-10-CM

## 2022-11-02 DIAGNOSIS — I10 Essential (primary) hypertension: Secondary | ICD-10-CM

## 2022-11-02 DIAGNOSIS — J90 Pleural effusion, not elsewhere classified: Secondary | ICD-10-CM | POA: Diagnosis not present

## 2022-11-02 DIAGNOSIS — N186 End stage renal disease: Secondary | ICD-10-CM

## 2022-11-02 DIAGNOSIS — K5900 Constipation, unspecified: Secondary | ICD-10-CM

## 2022-11-02 DIAGNOSIS — I739 Peripheral vascular disease, unspecified: Secondary | ICD-10-CM

## 2022-11-02 DIAGNOSIS — I5032 Chronic diastolic (congestive) heart failure: Secondary | ICD-10-CM

## 2022-11-02 DIAGNOSIS — E875 Hyperkalemia: Secondary | ICD-10-CM

## 2022-11-02 HISTORY — PX: CHEST TUBE INSERTION: SHX231

## 2022-11-02 LAB — CBC WITH DIFFERENTIAL/PLATELET
Abs Immature Granulocytes: 0.13 10*3/uL — ABNORMAL HIGH (ref 0.00–0.07)
Basophils Absolute: 0 10*3/uL (ref 0.0–0.1)
Basophils Relative: 0 %
Eosinophils Absolute: 0.1 10*3/uL (ref 0.0–0.5)
Eosinophils Relative: 0 %
HCT: 31.3 % — ABNORMAL LOW (ref 39.0–52.0)
Hemoglobin: 9.3 g/dL — ABNORMAL LOW (ref 13.0–17.0)
Immature Granulocytes: 1 %
Lymphocytes Relative: 4 %
Lymphs Abs: 0.4 10*3/uL — ABNORMAL LOW (ref 0.7–4.0)
MCH: 28.9 pg (ref 26.0–34.0)
MCHC: 29.7 g/dL — ABNORMAL LOW (ref 30.0–36.0)
MCV: 97.2 fL (ref 80.0–100.0)
Monocytes Absolute: 0.9 10*3/uL (ref 0.1–1.0)
Monocytes Relative: 7 %
Neutro Abs: 11.1 10*3/uL — ABNORMAL HIGH (ref 1.7–7.7)
Neutrophils Relative %: 88 %
Platelets: 184 10*3/uL (ref 150–400)
RBC: 3.22 MIL/uL — ABNORMAL LOW (ref 4.22–5.81)
RDW: 14.9 % (ref 11.5–15.5)
WBC: 12.6 10*3/uL — ABNORMAL HIGH (ref 4.0–10.5)
nRBC: 0 % (ref 0.0–0.2)

## 2022-11-02 LAB — BODY FLUID CELL COUNT WITH DIFFERENTIAL
Eos, Fluid: 0 %
Lymphs, Fluid: 6 %
Monocyte-Macrophage-Serous Fluid: 3 % — ABNORMAL LOW (ref 50–90)
Neutrophil Count, Fluid: 91 % — ABNORMAL HIGH (ref 0–25)
Total Nucleated Cell Count, Fluid: 2125 cu mm — ABNORMAL HIGH (ref 0–1000)

## 2022-11-02 LAB — COMPREHENSIVE METABOLIC PANEL
ALT: 5 U/L (ref 0–44)
AST: 7 U/L — ABNORMAL LOW (ref 15–41)
Albumin: 2.4 g/dL — ABNORMAL LOW (ref 3.5–5.0)
Alkaline Phosphatase: 211 U/L — ABNORMAL HIGH (ref 38–126)
Anion gap: 16 — ABNORMAL HIGH (ref 5–15)
BUN: 35 mg/dL — ABNORMAL HIGH (ref 6–20)
CO2: 25 mmol/L (ref 22–32)
Calcium: 6.3 mg/dL — CL (ref 8.9–10.3)
Chloride: 95 mmol/L — ABNORMAL LOW (ref 98–111)
Creatinine, Ser: 6.27 mg/dL — ABNORMAL HIGH (ref 0.61–1.24)
GFR, Estimated: 10 mL/min — ABNORMAL LOW (ref >60)
Glucose, Bld: 125 mg/dL — ABNORMAL HIGH (ref 70–99)
Potassium: 3.9 mmol/L (ref 3.5–5.1)
Sodium: 136 mmol/L (ref 135–145)
Total Bilirubin: 0.4 mg/dL (ref 0.3–1.2)
Total Protein: 6 g/dL — ABNORMAL LOW (ref 6.5–8.1)

## 2022-11-02 LAB — CBC
HCT: 32.9 % — ABNORMAL LOW (ref 39.0–52.0)
Hemoglobin: 9.9 g/dL — ABNORMAL LOW (ref 13.0–17.0)
MCH: 29.1 pg (ref 26.0–34.0)
MCHC: 30.1 g/dL (ref 30.0–36.0)
MCV: 96.8 fL (ref 80.0–100.0)
Platelets: 192 10*3/uL (ref 150–400)
RBC: 3.4 MIL/uL — ABNORMAL LOW (ref 4.22–5.81)
RDW: 15.1 % (ref 11.5–15.5)
WBC: 12.2 10*3/uL — ABNORMAL HIGH (ref 4.0–10.5)
nRBC: 0 % (ref 0.0–0.2)

## 2022-11-02 LAB — BODY FLUID CULTURE W GRAM STAIN

## 2022-11-02 LAB — PROTEIN, PLEURAL OR PERITONEAL FLUID: Total protein, fluid: 4.2 g/dL

## 2022-11-02 LAB — LACTATE DEHYDROGENASE, PLEURAL OR PERITONEAL FLUID: LD, Fluid: 574 U/L — ABNORMAL HIGH (ref 3–23)

## 2022-11-02 LAB — PROTEIN, TOTAL: Total Protein: 6.4 g/dL — ABNORMAL LOW (ref 6.5–8.1)

## 2022-11-02 LAB — LACTATE DEHYDROGENASE: LDH: 105 U/L (ref 98–192)

## 2022-11-02 SURGERY — CHEST TUBE INSERTION
Anesthesia: LOCAL | Laterality: Left

## 2022-11-02 MED ORDER — SENNOSIDES-DOCUSATE SODIUM 8.6-50 MG PO TABS
1.0000 | ORAL_TABLET | Freq: Two times a day (BID) | ORAL | Status: DC
  Administered 2022-11-02 – 2022-11-29 (×29): 1 via ORAL
  Filled 2022-11-02 (×38): qty 1

## 2022-11-02 MED ORDER — SORBITOL 70 % SOLN
30.0000 mL | Status: AC
  Administered 2022-11-02 (×2): 30 mL via ORAL
  Filled 2022-11-02 (×2): qty 30

## 2022-11-02 MED ORDER — LORAZEPAM 1 MG PO TABS
1.0000 mg | ORAL_TABLET | ORAL | Status: AC
  Administered 2022-11-02: 1 mg via ORAL
  Filled 2022-11-02: qty 1

## 2022-11-02 MED ORDER — OXYCODONE HCL 5 MG PO TABS
5.0000 mg | ORAL_TABLET | Freq: Four times a day (QID) | ORAL | Status: DC | PRN
  Administered 2022-11-03 – 2022-11-17 (×18): 5 mg via ORAL
  Filled 2022-11-02 (×18): qty 1

## 2022-11-02 MED ORDER — ALBUMIN HUMAN 25 % IV SOLN
25.0000 g | Freq: Once | INTRAVENOUS | Status: AC
  Administered 2022-11-02: 25 g via INTRAVENOUS
  Filled 2022-11-02: qty 100

## 2022-11-02 MED ORDER — SODIUM CHLORIDE 0.9% FLUSH
10.0000 mL | Freq: Three times a day (TID) | INTRAVENOUS | Status: DC
  Administered 2022-11-02 – 2022-11-12 (×17): 10 mL via INTRAPLEURAL

## 2022-11-02 MED ORDER — ACETAMINOPHEN 325 MG PO TABS
650.0000 mg | ORAL_TABLET | Freq: Four times a day (QID) | ORAL | Status: DC | PRN
  Administered 2022-11-03 – 2022-11-15 (×2): 650 mg via ORAL
  Filled 2022-11-02 (×3): qty 2

## 2022-11-02 MED ORDER — MIDODRINE HCL 5 MG PO TABS
10.0000 mg | ORAL_TABLET | ORAL | Status: DC
  Administered 2022-11-02: 10 mg via ORAL
  Filled 2022-11-02: qty 2

## 2022-11-02 NOTE — Progress Notes (Signed)
NAME:  Corey [name], MRN:  [id number], DOB:  01/08/1971, LOS: 6 ADMISSION DATE:  10/27/2022, CONSULTATION DATE:  10/28/2022 REFERRING MD:  Roney Mans, CHIEF COMPLAINT:  pleural effusion.   History of Present Illness:  52 year old man who is on hemodialysis with a left pleural effusion. He has had general decline in the state of health over the last several months with loss of appetite. He had a relatively abrupt onset of left chest discomfort which she like into a sensation of trapped gas in the left side with a pleuritic component.  Mild cough and somewhat increased shortness of breath.  Some subjective fever and chills.  CT shows a moderate to large loculated left pleural effusion.  Pertinent  Medical History   Past Medical History:  Diagnosis Date   Arthritis    hands and shoulders   Blindness and low vision    "Stargardt disease"   Dissection of aorta (HCC) 2004   a. s/p extensive repeair in 2004 in [place] complicated by ESRD, lower extremity paralysis, coma, and extended hospitalization of 2 years   ESRD (end stage renal disease) (HCC)    a. TTS   Headache    History of cardioversion 2014   Hypertension    Neuropathy    Non-healing non-surgical wound 03/2016   PAF (paroxysmal atrial fibrillation) (HCC)    a. s/p DCCV in 2014; b. on Coumadin; c. CHADS2VASc => 2 (HTN, vascular disease)   Paralysis (HCC)    due to dissection of aorta in 2004, lower extremities   Pneumonia      Significant Hospital Events: Including procedures, antibiotic start and stop dates in addition to other pertinent events   7/25 - CT shows loculated left pleural effusion with associated consolidation.  No evidence of lymphadenopathy or mass. 7/28 No acute issues overnight, remains on 3L [place]. INR remains elevated at 2.5, Vitamin K given.    Interim History / Subjective:  Seen lying in bed with no acute complaints  States he is very fearful of needles and request antianxiety medication prior to procedure    Objective   Blood pressure (!) 78/50, pulse 66, temperature 97.7 F (36.5 C), temperature source Oral, resp. rate 20, height 6\' 3"  (1.905 m), weight 63 kg, SpO2 98%.        Intake/Output Summary (Last 24 hours) at 11/02/2022 1713 Last data filed at 11/02/2022 0800 Gross per 24 hour  Intake 240 ml  Output 1 ml  Net 239 ml   Filed Weights   10/29/22 1146 11/01/22 0744 11/01/22 1030  Weight: 61.6 kg 63.6 kg 63 kg    Examination: General: Acute on chronically ill appearing elderly male lying in bed, in NAD HEENT: [place]/AT, MM pink/moist, PERRL,  Neuro: Alert and oriented x3, non-focal  CV: s1s2 regular rate and rhythm, no murmur, rubs, or gallops,  PULM:  Diminished left base, no increased work of breathing, no added breath sounds  GI: soft, bowel sounds active in all 4 quadrants, non-tender, non-distended, tolerating oral diet Extremities: warm/dry, no edema  Skin: no rashes or lesions  Ancillary Tests personally reviewed  INR 1.5 todayu.   Assessment & Plan:  Loculated left pleural effusion -Given his symptomatology it appears that effusion is relatively acute. Differentials include hemothorax vs parapneumonic vs malignant effusion  End-stage renal disease on hemodialysis Unexplained weight loss Calciphylaxis P: - For Chest tube today. Will send for culture and cytology.   Best Practice (right click and "Reselect all SmartList Selections" daily)  Per primary  Critical care time: NA  Whitney D. Harris, NP-C [facility] Personal contact information can be found on Amion  If no contact or response made please call [phone] 11/02/2022, 5:13 PM

## 2022-11-02 NOTE — Progress Notes (Addendum)
PROGRESS NOTE    Corey [name]  ZOX:[id number] DOB: 1970-09-20 DOA: 10/27/2022 PCP: [name], NP   Chief Complaint  Patient presents with   Flank Pain   Shoulder Pain    Brief Narrative: Corey [name] is a 52 y.o. male with medical history significant of end-stage renal disease on hemodialysis TTS, paroxysmal atrial fibrillation on chronic Coumadin therapy, history of previous aortic dissection, peripheral vascular disease, history of osteoarthritis, history of neuropathy, who missed hemodialysis twice secondary to nausea vomiting and diarrhea.  Patient came to the ER with shortness of breath. Was found to be hypoxic with new oxygen requirement.  Also significant hyperkalemia.  Nephrology was consulted and patient is being dialyzed.  As part of his workup patient was found to have complex right-sided pleural effusion that looks loculated.  There is worried that this represents possible infection versus other loculated pleural effusion.  Patient is therefore being admitted to the hospital.  Patient currently being dialyzed as indicated.    Currently awaiting improvement in INR for chest tube placement.    Vascular was consulted for PVD.     Assessment & Plan:   Principal Problem:   Pleural effusion Active Problems:   ESRD on dialysis [facility])   Essential hypertension   Chronic diastolic CHF (congestive heart failure) (HCC)   Atrial fibrillation, chronic (HCC)   End-stage renal disease on hemodialysis (HCC)   AAA (abdominal aortic aneurysm) without rupture (HCC)   Hyperkalemia   PVD (peripheral vascular disease) (HCC)   Constipation  #1 acute hypoxic respiratory failure secondary to large loculated left pleural effusion -Patient noted to have presented with shortness of breath noted to be hypoxic with new oxygen requirement. -Patient seen in consultation by nephrology underwent hemodialysis with no significant improvement with shortness of breath. -Patient noted to have a  complex left-sided pleural effusion which was concerning for loculation. -PCCM consulted and following and was awaiting for improvement with INR prior to chest tube placement and further evaluation. -PCCM was awaiting INR to be subtherapeutic prior to procedures. -Patient noted to have received vitamin K on 11/01/2022, INR currently at 1.5 today. -Patient for chest tube placement and diagnostic thoracentesis per PCCM/pulmonary today..  2.  ESRD -On HD. -Cinacalcet on hold due to concerns for hypoxia per nephrology. -Follow.  3.  Hypotension -Continue midodrine.  4.  PVD/bilateral lower extremities with chronic skin changes related to vascular disease -Patient seen in consultation by vascular surgery who recommended Eucerin daily, outpatient follow-up with vascular surgery, outpatient follow-up.  Travel team.  5.  Hyperkalemia -Resolved.  6.  Paroxysmal A-fib -Continue amiodarone for rate control.  Warfarin on hold in preparation for possible thoracentesis.  7.  Chronic diastolic heart failure -Volume management with HD. -Supportive care.  8.  Hypertension -BP currently soft/stable.  Patient asymptomatic.  9.  Constipation -MiraLAX twice daily, enema as needed. -Sorbitol p.o. every 3 hours x 2 doses.  10.  Hyperkalemia -Resolved. -On HD.  11.  Paroxysmal atrial fibrillation -Continue amiodarone for rate control. -Coumadin on hold in anticipation of chest tube placement/thoracentesis today. -Pulmonary to advise when anticoagulation may be resumed.  12.  Chronic diastolic CHF -Volume being managed via hemodialysis. -2D echo 2020 with a EF of 60 to 65%, increased LV wall thickness.  13.  Hypertension -BP currently soft. -Patient not taking metoprolol at home.     DVT prophylaxis: INR therapeutic.  Patient awaiting procedure today. Code Status: Full Family Communication: Updated patient.  No family at bedside. Disposition:  TBD  Status is: Inpatient Remains  inpatient appropriate because: Severity of illness   Consultants:  Nephrology: Dr. Arlean Hopping 10/27/2022 PCCM: Dr. Denese Killings 10/28/2022 Vascular surgery: Dr. Edilia Bo 10/30/2022  Procedures:  CT angiogram chest abdomen and pelvis 11/02/2023 CT chest abdomen and pelvis 10/27/2019 Chest x-ray 10/27/2022 Vascular ultrasound with ABIs 11/01/2022   Antimicrobials:  Anti-infectives (From admission, onward)    None         Subjective: Patient laying in bed.  Noted to have hemodialysis yesterday.  Still with complaints of shortness of breath with no significant improvement.  Some left-sided upper abdominal pain which he relates secondary to effusion.  No substernal chest pain.  Objective: Vitals:   11/02/22 1600 11/02/22 1610 11/02/22 1620 11/02/22 1630  BP: (!) 74/48 (!) 75/49 (!) 74/48 (!) 74/49  Pulse: 66 66 68 65  Resp: (!) 21 (!) 28 (!) 28 (!) 22  Temp:      TempSrc:      SpO2: 98% 100% 94% 97%  Weight:      Height:        Intake/Output Summary (Last 24 hours) at 11/02/2022 1649 Last data filed at 11/02/2022 0800 Gross per 24 hour  Intake 240 ml  Output 1 ml  Net 239 ml   Filed Weights   10/29/22 1146 11/01/22 0744 11/01/22 1030  Weight: 61.6 kg 63.6 kg 63 kg    Examination:  General exam: Appears calm and comfortable  Respiratory system: Decreased breath sounds in the left base.  No wheezing.  Fair air movement.   Cardiovascular system: S1 & S2 heard, RRR. No JVD, murmurs, rubs, gallops or clicks.  Chronic woody changes of lower extremities without pitting edema.   Gastrointestinal system: Abdomen is nondistended, soft and some tenderness to palpation in the left abdominal region.  Positive bowel sounds.  No rebound.  No guarding.  Central nervous system: Alert and oriented. No focal neurological deficits. Extremities: Symmetric 5 x 5 power. Skin: No rashes, lesions or ulcers Psychiatry: Judgement and insight appear normal. Mood & affect appropriate.     Data Reviewed:  I have personally reviewed following labs and imaging studies  CBC: Recent Labs  Lab 10/29/22 0048 10/29/22 0830 10/30/22 0052 10/31/22 0056 11/01/22 0139 11/02/22 0905  WBC 11.0* 11.3* 12.7* 11.1* 11.3* 12.6*  NEUTROABS 9.2*  --  10.8* 9.5* 10.0* 11.1*  HGB 9.9* 9.7* 9.8* 9.8* 9.7* 9.3*  HCT 33.2* 33.0* 32.5* 32.7* 32.6* 31.3*  MCV 100.6* 99.7 101.2* 100.0 97.3 97.2  PLT 191 185 195 190 208 184    Basic Metabolic Panel: Recent Labs  Lab 10/29/22 0048 10/30/22 0052 10/31/22 0056 11/01/22 0139 11/02/22 0905  NA 138 133* 135 135 136  K 4.8 4.1 4.2 4.8 3.9  CL 96* 94* 95* 96* 95*  CO2 29 27 26 23 25   GLUCOSE 105* 105* 92 95 125*  BUN 54* 31* 43* 52* 35*  CREATININE 8.68* 5.76* 7.08* 8.47* 6.27*  CALCIUM 8.2* 7.8* 7.0* 6.3* 6.3*  MG 2.0 1.8 1.9 1.9  --   PHOS 4.6 3.6 3.6 3.7  --     GFR: Estimated Creatinine Clearance: 12.3 mL/min (A) (by C-G formula based on SCr of 6.27 mg/dL (H)).  Liver Function Tests: Recent Labs  Lab 10/29/22 0048 10/30/22 0052 10/31/22 0056 11/01/22 0139 11/02/22 0905  AST 6* 8* 6* 6* 7*  ALT 7 6 8 7 5   ALKPHOS 220* 213* 205* 209* 211*  BILITOT 0.6 0.6 0.7 0.4 0.4  PROT 5.9* 6.1* 6.1*  6.2* 6.0*  ALBUMIN 2.1* 2.6* 2.4* 2.4* 2.4*    CBG: Recent Labs  Lab 10/28/22 0008  GLUCAP 60*     Recent Results (from the past 240 hour(s))  MRSA Next Gen by PCR, Nasal     Status: None   Collection Time: 10/28/22 12:03 AM   Specimen: Nasal Mucosa; Nasal Swab  Result Value Ref Range Status   MRSA by PCR Next Gen NOT DETECTED NOT DETECTED Final    Comment: (NOTE) The GeneXpert MRSA Assay (FDA approved for NASAL specimens only), is one component of a comprehensive MRSA colonization surveillance program. It is not intended to diagnose MRSA infection nor to guide or monitor treatment for MRSA infections. Test performance is not FDA approved in patients less than 66 years old. Performed at [facility], [address], [place],  [place] [address]          Radiology Studies: VAS [place] ABI WITH/WO TBI  Result Date: 10/31/2022  LOWER EXTREMITY DOPPLER STUDY Patient Name:  Corey [name]  Date of Exam:   10/31/2022 Medical Rec #: [id number]     Accession #:    [id number] Date of Birth: [birth year]     Patient Gender: M Patient Age:   61 years Exam Location:  [facility] Procedure:      VAS [place] ABI WITH/WO TBI Referring Phys: Cristal Deer DICKSON --------------------------------------------------------------------------------  Indications: Peripheral artery disease. High Risk Factors: Hypertension. Other Factors: TAAA without rupture, AAA, ESRD.  Limitations: Today's exam was limited due to skin changes. Technically difficult              examination. Performing Technologist: Jean Rosenthal RDMS RVT  Examination Guidelines: A complete evaluation includes at minimum, Doppler waveform signals and systolic blood pressure reading at the level of bilateral brachial, anterior tibial, and posterior tibial arteries, when vessel segments are accessible. Bilateral testing is considered an integral part of a complete examination. Photoelectric Plethysmograph (PPG) waveforms and toe systolic pressure readings are included as required and additional duplex testing as needed. Limited examinations for reoccurring indications may be performed as noted.  ABI Findings: +---------+------------------+-----+----------+--------------------------------+ Right    Rt Pressure (mmHg)IndexWaveform  Comment                          +---------+------------------+-----+----------+--------------------------------+ Brachial                        triphasic History of AVFs- unable to                                                 obtain pressure                  +---------+------------------+-----+----------+--------------------------------+ PTA                             monophasic                                  +---------+------------------+-----+----------+--------------------------------+ DP                              monophasic                                 +---------+------------------+-----+----------+--------------------------------+  Great Toe                                 Unable to obtain signal                                                    secondary to toenail overgrowth  +---------+------------------+-----+----------+--------------------------------+ +---------+------------------+-----+------------------+------------------------+ Left     Lt Pressure (mmHg)IndexWaveform          Comment                  +---------+------------------+-----+------------------+------------------------+ Brachial 101                    triphasic                                  +---------+------------------+-----+------------------+------------------------+ PTA                             audibly monophasicPoor signal secondary to                                                   overlying skin                                                             thickening               +---------+------------------+-----+------------------+------------------------+ DP                              monophasic                                 +---------+------------------+-----+------------------+------------------------+ Great Toe                                         Unable to obtain signal                                                    secondary to toenail                                                       overgrowth               +---------+------------------+-----+------------------+------------------------+ Arterial wall calcification precludes accurate ankle pressures and ABIs.  Summary: Right: Resting right ankle-brachial index  indicates noncompressible right lower extremity arteries. Left: Resting left ankle-brachial index indicates noncompressible  left lower extremity arteries. *See table(s) above for measurements and observations.  Electronically signed by Gerarda Fraction on 10/31/2022 at 6:01:15 PM.    Final         Scheduled Meds:  (feeding supplement) PROSource Plus  30 mL Oral BID BM   amiodarone  200 mg Oral Daily   Chlorhexidine Gluconate Cloth  6 each Topical Daily   darbepoetin (ARANESP) injection - DIALYSIS  40 mcg Subcutaneous Q Sat-1800   hydrocerin   Topical Daily   lidocaine  3 patch Transdermal Q24H   midodrine  10 mg Oral Q T,Th,Sa-HD   polyethylene glycol  17 g Oral BID   senna-docusate  1 tablet Oral BID   sevelamer carbonate  4.8 g Oral TID WC   sodium chloride flush  10 mL Intrapleural Q8H   Continuous Infusions:   LOS: 6 days    Time spent: 35 minutes    Ramiro Harvest, MD [facility]   To contact the attending provider between 7A-7P or the covering provider during after hours 7P-7A, please log into the web site [email or web address] and access using universal [place] password for that web site. If you do not have the password, please call the hospital operator.  11/02/2022, 4:49 PM

## 2022-11-02 NOTE — Procedures (Signed)
Insertion of Chest Tube Procedure Note  Corey [name]  [id number]  [birth year]  Date:11/02/22  Time:4:46 PM    Provider Performing: Lynnell Catalan   Procedure: Pleural Catheter Insertion w/ Imaging Guidance (76160)  Indication(s) Effusion  Consent Risks of the procedure as well as the alternatives and risks of each were explained to the patient and/or caregiver.  Consent for the procedure was obtained and is signed in the bedside chart  Anesthesia Topical only with 1% lidocaine  lorazepam 1mg     Time Out Verified patient identification, verified procedure, site/side was marked, verified correct patient position, special equipment/implants available, medications/allergies/relevant history reviewed, required imaging and test results available.   Sterile Technique Maximal sterile technique including full sterile barrier drape, hand hygiene, sterile gown, sterile gloves, mask, hair covering, sterile ultrasound probe cover (if used).   Procedure Description Ultrasound used to identify appropriate pleural anatomy for placement and overlying skin marked. Area of placement cleaned and draped in sterile fashion.  A 14 French pigtail pleural catheter was placed into the left pleural space using Seldinger technique. Appropriate return of fluid was obtained.  The tube was connected to atrium and placed on -20 cm H2O wall suction.   Complications/Tolerance None; patient tolerated the procedure well. Insertion chest pain only Chest X-ray is ordered to verify placement.   EBL Minimal  Specimen(s) fluid  Lynnell Catalan, MD [facility] ICU Physician [facility]  Pager: [phone] Mobile: [phone] After hours: [phone].

## 2022-11-02 NOTE — Progress Notes (Signed)
[facility] Progress Note   Subjective:  Seen in room.  Had HD yesterday -  tolerated treatment but constipated and was on bedpan a lot trying to go which was uncomfortable.  INR 1.5 this AM.  Plans for chest tube today per pulm.   Objective Vitals:   11/01/22 2300 11/02/22 0000 11/02/22 0313 11/02/22 0800  BP:  118/79 (!) 107/58 93/65  Pulse:  65 64   Resp:  20 20   Temp: 98 F (36.7 C)  98 F (36.7 C) 98 F (36.7 C)  TempSrc: Oral  Oral Oral  SpO2:  93% 97%   Weight:      Height:       Physical Exam General: Chronically ill appearing man, NAD. Nasal O2 in place Heart: RRR; no murmur Lungs: Dull L base, otherwise clear Abdomen: soft, mild TTP in L abdomen Extremities: chronic woody changes without pitting edema Dialysis Access: [facility]  c/d/i  Additional Objective Labs: Basic Metabolic Panel: Recent Labs  Lab 10/30/22 0052 10/31/22 0056 11/01/22 0139  NA 133* 135 135  K 4.1 4.2 4.8  CL 94* 95* 96*  CO2 27 26 23   GLUCOSE 105* 92 95  BUN 31* 43* 52*  CREATININE 5.76* 7.08* 8.47*  CALCIUM 7.8* 7.0* 6.3*  PHOS 3.6 3.6 3.7   Liver Function Tests: Recent Labs  Lab 10/30/22 0052 10/31/22 0056 11/01/22 0139  AST 8* 6* 6*  ALT 6 8 7   ALKPHOS 213* 205* 209*  BILITOT 0.6 0.7 0.4  PROT 6.1* 6.1* 6.2*  ALBUMIN 2.6* 2.4* 2.4*   Recent Labs  Lab 10/27/22 1302  LIPASE 33   CBC: Recent Labs  Lab 10/29/22 0048 10/29/22 0830 10/30/22 0052 10/31/22 0056 11/01/22 0139  WBC 11.0* 11.3* 12.7* 11.1* 11.3*  NEUTROABS 9.2*  --  10.8* 9.5* 10.0*  HGB 9.9* 9.7* 9.8* 9.8* 9.7*  HCT 33.2* 33.0* 32.5* 32.7* 32.6*  MCV 100.6* 99.7 101.2* 100.0 97.3  PLT 191 185 195 190 208   Medications:   (feeding supplement) PROSource Plus  30 mL Oral BID BM   amiodarone  200 mg Oral Daily   Chlorhexidine Gluconate Cloth  6 each Topical Daily   darbepoetin (ARANESP) injection - DIALYSIS  40 mcg Subcutaneous Q Sat-1800   hydrocerin   Topical Daily   lidocaine  3  patch Transdermal Q24H   midodrine  10 mg Oral Q T,Th,Sa-HD   polyethylene glycol  17 g Oral BID   sevelamer carbonate  4.8 g Oral TID WC    Dialysis Orders: TTS [facility] 4hr, 2K/2Ca, UFP #4, TDC, EDW 66kg, no heparin (allergy) - locks TDC with CaCitrate - No ESA or VDRA - Binder: Renvela pwdr 4.8g TID - Sensipar 180mg  every day at home   Assessment/Plan: Acute hypoxic respiratory failure: In setting of missed HD. CXR with pleural effusion - awaiting thora. LE now resolved. Loculated L pleural effusion: For thoracentesis with cultures.  Afebrile.  ESRD: Continue usual TTS schedule - HD tomorrow.  BP/volume: BP low, edema resolved and now well below prior EDW.  Continue to trend new EDW.  Has not required much UF this week --> try 1.5L tomorrow. Anemia of ESRD:  Hgb 9s - s/p Aranesp on 7/27. Secondary HPTH: Ca/Phos ok - continue home binders.  Held cinacalcet due to hypoCa - consider resuming at lower dose (was 180 daily) if Ca improves. Nutrition: Alb low, continue supplements. A-fib: On amiodarone + warfarin (on hold for procedure), has IVC filter Hx type B  aortic dissection s/p repair (2004)   Estill Bakes MD [facility] Pager [phone]

## 2022-11-02 NOTE — Progress Notes (Signed)
Chest tube placed no sedation , tolerated well, draining red colored fluid

## 2022-11-03 DIAGNOSIS — J181 Lobar pneumonia, unspecified organism: Secondary | ICD-10-CM | POA: Diagnosis present

## 2022-11-03 DIAGNOSIS — E875 Hyperkalemia: Secondary | ICD-10-CM | POA: Diagnosis not present

## 2022-11-03 DIAGNOSIS — J90 Pleural effusion, not elsewhere classified: Secondary | ICD-10-CM | POA: Diagnosis not present

## 2022-11-03 DIAGNOSIS — I714 Abdominal aortic aneurysm, without rupture, unspecified: Secondary | ICD-10-CM | POA: Diagnosis not present

## 2022-11-03 DIAGNOSIS — K5900 Constipation, unspecified: Secondary | ICD-10-CM | POA: Diagnosis not present

## 2022-11-03 MED ORDER — SODIUM CHLORIDE 0.9 % IV SOLN
500.0000 mg | INTRAVENOUS | Status: AC
  Administered 2022-11-03 – 2022-11-07 (×5): 500 mg via INTRAVENOUS
  Filled 2022-11-03 (×5): qty 5

## 2022-11-03 MED ORDER — MIDODRINE HCL 5 MG PO TABS
10.0000 mg | ORAL_TABLET | Freq: Three times a day (TID) | ORAL | Status: DC
  Administered 2022-11-03 – 2022-11-04 (×4): 10 mg via ORAL
  Filled 2022-11-03 (×4): qty 2

## 2022-11-03 MED ORDER — ALBUMIN HUMAN 5 % IV SOLN
25.0000 g | Freq: Once | INTRAVENOUS | Status: AC
  Administered 2022-11-03: 25 g via INTRAVENOUS
  Filled 2022-11-03: qty 500

## 2022-11-03 MED ORDER — SODIUM CHLORIDE 0.9 % IV BOLUS: 500.0000 mL | Freq: Once | INTRAVENOUS | Status: DC

## 2022-11-03 MED ORDER — HYDROMORPHONE HCL 1 MG/ML IJ SOLN
0.5000 mg | INTRAMUSCULAR | Status: DC | PRN
  Administered 2022-11-05 – 2022-11-17 (×13): 0.5 mg via INTRAVENOUS
  Filled 2022-11-03 (×13): qty 0.5

## 2022-11-03 MED ORDER — SODIUM CHLORIDE 0.9 % IV SOLN
2.0000 g | INTRAVENOUS | Status: AC
  Administered 2022-11-03 – 2022-11-07 (×5): 2 g via INTRAVENOUS
  Filled 2022-11-03 (×5): qty 20

## 2022-11-03 MED ORDER — CALCIUM GLUCONATE-NACL 1-0.675 GM/50ML-% IV SOLN
1.0000 g | Freq: Once | INTRAVENOUS | Status: AC
  Administered 2022-11-03: 1000 mg via INTRAVENOUS
  Filled 2022-11-03: qty 50

## 2022-11-03 NOTE — Progress Notes (Signed)
PROGRESS NOTE    Corey [name]  ZOX:[id number] DOB: 1971/01/19 DOA: 10/27/2022 PCP: [name], NP   Chief Complaint  Patient presents with   Flank Pain   Shoulder Pain    Brief Narrative: Corey [name] is a 52 y.o. male with medical history significant of end-stage renal disease on hemodialysis TTS, paroxysmal atrial fibrillation on chronic Coumadin therapy, history of previous aortic dissection, peripheral vascular disease, history of osteoarthritis, history of neuropathy, who missed hemodialysis twice secondary to nausea vomiting and diarrhea.  Patient came to the ER with shortness of breath. Was found to be hypoxic with new oxygen requirement.  Also significant hyperkalemia.  Nephrology was consulted and patient is being dialyzed.  As part of his workup patient was found to have complex right-sided pleural effusion that looks loculated.  There is worried that this represents possible infection versus other loculated pleural effusion.  Patient is therefore being admitted to the hospital.  Patient currently being dialyzed as indicated.    Currently awaiting improvement in INR for chest tube placement.    Vascular was consulted for PVD.     Assessment & Plan:   Principal Problem:   Pleural effusion Active Problems:   ESRD on dialysis [facility])   Essential hypertension   Chronic diastolic CHF (congestive heart failure) (HCC)   Atrial fibrillation, chronic (HCC)   End-stage renal disease on hemodialysis (HCC)   AAA (abdominal aortic aneurysm) without rupture (HCC)   Hyperkalemia   PVD (peripheral vascular disease) (HCC)   Constipation   Lobar pneumonia (HCC)  #1 acute hypoxic respiratory failure secondary to large loculated left pleural effusion -Patient noted to have presented with shortness of breath noted to be hypoxic with new oxygen requirement. -Patient seen in consultation by nephrology underwent hemodialysis with no significant improvement with shortness of  breath. -Patient noted to have a complex left-sided pleural effusion which was concerning for loculation. -PCCM consulted and following and was awaiting for improvement with INR prior to chest tube placement and further evaluation. -Patient subsequently underwent chest tube placement per PCCM on 11/02/2022.  -Pleural fluid analysis according to lights criteria consistent with a exudative parapneumonic effusion.  -Pleural fluid cultures and Gram stain pending.   -Start IV Rocephin and azithromycin.   -Per PCCM.  2.  Left-sided pneumonia -Noted on chest x-ray.  Patient also noted to have a loculated left pleural effusion. -Patient underwent chest tube placement, pleural fluid analysis consistent with an exudative parapneumonic effusion. -Check a sputum Gram stain and culture, urine Legionella antigen, urine pneumococcus antigen. -Check blood cultures x 2. -Place on IV Rocephin, IV azithromycin. -PCCM/pulmonary following.  3.  ESRD -On HD. -Cinacalcet on hold due to concerns for hypoxia per nephrology. -Patient was to have HD this morning however due to hypotension HD on hold pending midodrine and IV albumin. -Per nephrology.  4.  Hypotension -IV albumin x 1 ordered and midodrine dose changed to 10 mg 3 times daily from just during HD per nephrology.  5.  PVD/bilateral lower extremities with chronic skin changes related to vascular disease -Patient seen in consultation by vascular surgery who recommended Eucerin daily, outpatient follow-up with vascular surgery, outpatient follow-up.  Travel team.  6.  Hyperkalemia -Resolved.  7.  Chronic diastolic heart failure -Volume management with HD. -Supportive care.  8.  Constipation -Continue MiraLAX twice daily, enema as needed. -Status post sorbitol x 2 doses.  9.  Hyperkalemia -Resolved. -On HD.  10.  Paroxysmal atrial fibrillation -Continue amiodarone for rate control. -  Coumadin held for chest tube placement/thoracentesis which  was done 11/02/2022.  -Pulmonary to advise when anticoagulation may be resumed.  11.  Chronic diastolic CHF -Volume being managed via hemodialysis. -2D echo 2020 with a EF of 60 to 65%, increased LV wall thickness.  12.  Hypertension>>> hypotension -BP noted this morning with systolics in the 70s. -Patient not taking metoprolol at home. -IV albumin ordered per nephrology and patient midodrine changed to 10 mg 3 times daily from during HD.  13.  Hypocalcemia -Calcium gluconate 1 g IV given this morning.     DVT prophylaxis: INR therapeutic.  Patient awaiting procedure today. Code Status: Full Family Communication: Updated patient.  No family at bedside. Disposition: TBD  Status is: Inpatient Remains inpatient appropriate because: Severity of illness   Consultants:  Nephrology: Dr. Arlean Hopping 10/27/2022 PCCM: Dr. Denese Killings 10/28/2022 Vascular surgery: Dr. Edilia Bo 10/30/2022  Procedures:  CT angiogram chest abdomen and pelvis 11/02/2023 CT chest abdomen and pelvis 10/27/2019 Chest x-ray 10/27/2022 Vascular ultrasound with ABIs 11/01/2022 30 insertion with image guidance/chest tube placement: Per PCCM, Dr. Denese Killings 11/02/2022  Antimicrobials:  Anti-infectives (From admission, onward)    Start     Dose/Rate Route Frequency Ordered Stop   11/03/22 0915  cefTRIAXone (ROCEPHIN) 2 g in sodium chloride 0.9 % 100 mL IVPB        2 g 200 mL/hr over 30 Minutes Intravenous Every 24 hours 11/03/22 0823 11/08/22 0914   11/03/22 0915  azithromycin (ZITHROMAX) 500 mg in sodium chloride 0.9 % 250 mL IVPB        500 mg 250 mL/hr over 60 Minutes Intravenous Every 24 hours 11/03/22 0823 11/08/22 0914         Subjective: Patient laying in bed.  Complaining of pain around chest tube site and left upper quadrant.  Denies any significant chest pain.  Still with some shortness of breath.  Noted to be hypotensive with systolic blood pressures in the 70s this morning.  Objective: Vitals:   11/03/22  0800 11/03/22 0900 11/03/22 1144 11/03/22 1614  BP: (!) 81/57 (!) 88/53 (!) 78/51 (!) 89/58  Pulse: 66 67 64   Resp: (!) 21 (!) 23 18 17   Temp:   98.2 F (36.8 C) 98.2 F (36.8 C)  TempSrc:   Oral Oral  SpO2: 100% 99% 100% 100%  Weight:      Height:        Intake/Output Summary (Last 24 hours) at 11/03/2022 1616 Last data filed at 11/03/2022 1500 Gross per 24 hour  Intake 511.14 ml  Output 90 ml  Net 421.14 ml   Filed Weights   10/29/22 1146 11/01/22 0744 11/01/22 1030  Weight: 61.6 kg 63.6 kg 63 kg    Examination:  General exam: Appears calm and comfortable  Respiratory system: Decreased breath sounds in the left base.  Some scattered coarse breath sounds.  No wheezing.   Left chest tube noted. Cardiovascular system: RRR no murmurs rubs or gallops.  No JVD. Chronic woody changes of lower extremities without pitting edema.   Gastrointestinal system: Abdomen is soft, nondistended, some tenderness to palpation left upper quadrant.  Positive bowel sounds.  No rebound.  No guarding.  Central nervous system: Alert and oriented. No focal neurological deficits. Extremities: Symmetric 5 x 5 power. Skin: No rashes, lesions or ulcers Psychiatry: Judgement and insight appear normal. Mood & affect appropriate.     Data Reviewed: I have personally reviewed following labs and imaging studies  CBC: Recent Labs  Lab 10/30/22 0052  10/31/22 0056 11/01/22 0139 11/02/22 0905 11/02/22 1743 11/03/22 0316  WBC 12.7* 11.1* 11.3* 12.6* 12.2* 12.0*  NEUTROABS 10.8* 9.5* 10.0* 11.1*  --  10.4*  HGB 9.8* 9.8* 9.7* 9.3* 9.9* 9.8*  HCT 32.5* 32.7* 32.6* 31.3* 32.9* 32.8*  MCV 101.2* 100.0 97.3 97.2 96.8 97.3  PLT 195 190 208 184 192 217    Basic Metabolic Panel: Recent Labs  Lab 10/29/22 0048 10/30/22 0052 10/31/22 0056 11/01/22 0139 11/02/22 0905 11/03/22 0316  NA 138 133* 135 135 136 136  K 4.8 4.1 4.2 4.8 3.9 4.0  CL 96* 94* 95* 96* 95* 97*  CO2 29 27 26 23 25 25   GLUCOSE 105*  105* 92 95 125* 96  BUN 54* 31* 43* 52* 35* 38*  CREATININE 8.68* 5.76* 7.08* 8.47* 6.27* 7.04*  CALCIUM 8.2* 7.8* 7.0* 6.3* 6.3* 6.2*  MG 2.0 1.8 1.9 1.9  --   --   PHOS 4.6 3.6 3.6 3.7  --  3.1    GFR: Estimated Creatinine Clearance: 10.9 mL/min (A) (by C-G formula based on SCr of 7.04 mg/dL (H)).  Liver Function Tests: Recent Labs  Lab 10/29/22 0048 10/30/22 0052 10/31/22 0056 11/01/22 0139 11/02/22 0905 11/02/22 1743 11/03/22 0316  AST 6* 8* 6* 6* 7*  --   --   ALT 7 6 8 7 5   --   --   ALKPHOS 220* 213* 205* 209* 211*  --   --   BILITOT 0.6 0.6 0.7 0.4 0.4  --   --   PROT 5.9* 6.1* 6.1* 6.2* 6.0* 6.4*  --   ALBUMIN 2.1* 2.6* 2.4* 2.4* 2.4*  --  2.7*    CBG: Recent Labs  Lab 10/28/22 0008  GLUCAP 60*     Recent Results (from the past 240 hour(s))  MRSA Next Gen by PCR, Nasal     Status: None   Collection Time: 10/28/22 12:03 AM   Specimen: Nasal Mucosa; Nasal Swab  Result Value Ref Range Status   MRSA by PCR Next Gen NOT DETECTED NOT DETECTED Final    Comment: (NOTE) The GeneXpert MRSA Assay (FDA approved for NASAL specimens only), is one component of a comprehensive MRSA colonization surveillance program. It is not intended to diagnose MRSA infection nor to guide or monitor treatment for MRSA infections. Test performance is not FDA approved in patients less than 73 years old. Performed at [facility], [address] [place], [place] [address]   Body fluid culture w Gram Stain     Status: None (Preliminary result)   Collection Time: 11/02/22  4:44 PM   Specimen: Pleural Fluid  Result Value Ref Range Status   Specimen Description FLUID PLEURAL  Final   Special Requests NONE  Final   Gram Stain   Final    RARE WBC PRESENT, PREDOMINANTLY PMN NO ORGANISMS SEEN    Culture   Final    NO GROWTH < 24 HOURS Performed at [facility], [address] [place], [place] [address]    Report Status PENDING  Incomplete         Radiology  Studies: DG CHEST PORT 1 VIEW  Result Date: 11/02/2022 CLINICAL DATA:  Status post chest tube placement EXAM: PORTABLE CHEST 1 VIEW COMPARISON:  Chest x-ray 10/27/2022 FINDINGS: New left pleural drainage catheter is seen in the lower left hemithorax. Small left pleural effusion persists. Tiny component of pneumothorax is seen in the left costophrenic angle adjacent to the catheter likely related to  recent catheter placement. There is a small left pleural effusion which is unchanged. Left mid and lower lung airspace disease and right basilar atelectasis appear stable. There is diffuse interstitial prominence throughout both lungs similar to the prior study. The cardiomediastinal silhouette is unchanged, the aorta is tortuous in the heart is enlarged. Right-sided central venous catheter tip projects over the distal SVC. Vascular stent is seen in the right upper arm. No acute fractures are identified. IMPRESSION: 1. New left pleural drainage catheter in place with persistent small left pleural effusion and tiny pneumothorax in the left costophrenic angle. 2. Stable left mid and lower lung airspace disease and right basilar atelectasis. 3. Stable diffuse interstitial prominence throughout both lungs. Electronically Signed   By: Darliss Cheney M.D.   On: 11/02/2022 17:23   VAS [place] ABI WITH/WO TBI  Result Date: 10/31/2022  LOWER EXTREMITY DOPPLER STUDY Patient Name:  ORIE WORSTELL  Date of Exam:   10/31/2022 Medical Rec #: [id number]     Accession #:    [id number] Date of Birth: 21-Jan-1971     Patient Gender: M Patient Age:   22 years Exam Location:  [facility] Procedure:      VAS [place] ABI WITH/WO TBI Referring Phys: Cristal Deer DICKSON --------------------------------------------------------------------------------  Indications: Peripheral artery disease. High Risk Factors: Hypertension. Other Factors: TAAA without rupture, AAA, ESRD.  Limitations: Today's exam was limited due to skin changes. Technically  difficult              examination. Performing Technologist: Jean Rosenthal RDMS RVT  Examination Guidelines: A complete evaluation includes at minimum, Doppler waveform signals and systolic blood pressure reading at the level of bilateral brachial, anterior tibial, and posterior tibial arteries, when vessel segments are accessible. Bilateral testing is considered an integral part of a complete examination. Photoelectric Plethysmograph (PPG) waveforms and toe systolic pressure readings are included as required and additional duplex testing as needed. Limited examinations for reoccurring indications may be performed as noted.  ABI Findings: +---------+------------------+-----+----------+--------------------------------+ Right    Rt Pressure (mmHg)IndexWaveform  Comment                          +---------+------------------+-----+----------+--------------------------------+ Brachial                        triphasic History of AVFs- unable to                                                 obtain pressure                  +---------+------------------+-----+----------+--------------------------------+ PTA                             monophasic                                 +---------+------------------+-----+----------+--------------------------------+ DP                              monophasic                                 +---------+------------------+-----+----------+--------------------------------+  Great Toe                                 Unable to obtain signal                                                    secondary to toenail overgrowth  +---------+------------------+-----+----------+--------------------------------+ +---------+------------------+-----+------------------+------------------------+ Left     Lt Pressure (mmHg)IndexWaveform          Comment                  +---------+------------------+-----+------------------+------------------------+  Brachial 101                    triphasic                                  +---------+------------------+-----+------------------+------------------------+ PTA                             audibly monophasicPoor signal secondary to                                                   overlying skin                                                             thickening               +---------+------------------+-----+------------------+------------------------+ DP                              monophasic                                 +---------+------------------+-----+------------------+------------------------+ Great Toe                                         Unable to obtain signal                                                    secondary to toenail                                                       overgrowth               +---------+------------------+-----+------------------+------------------------+ Arterial wall calcification precludes accurate ankle pressures and ABIs.  Summary: Right: Resting right ankle-brachial  index indicates noncompressible right lower extremity arteries. Left: Resting left ankle-brachial index indicates noncompressible left lower extremity arteries. *See table(s) above for measurements and observations.  Electronically signed by Gerarda Fraction on 10/31/2022 at 6:01:15 PM.    Final         Scheduled Meds:  (feeding supplement) PROSource Plus  30 mL Oral BID BM   amiodarone  200 mg Oral Daily   Chlorhexidine Gluconate Cloth  6 each Topical Daily   darbepoetin (ARANESP) injection - DIALYSIS  40 mcg Subcutaneous Q Sat-1800   hydrocerin   Topical Daily   lidocaine  3 patch Transdermal Q24H   midodrine  10 mg Oral TID WC   polyethylene glycol  17 g Oral BID   senna-docusate  1 tablet Oral BID   sevelamer carbonate  4.8 g Oral TID WC   sodium chloride flush  10 mL Intrapleural Q8H   Continuous Infusions:  azithromycin 500  mg (11/03/22 1109)   cefTRIAXone (ROCEPHIN)  IV 2 g (11/03/22 1012)     LOS: 7 days    Time spent: 35 minutes    Ramiro Harvest, MD [facility]   To contact the attending provider between 7A-7P or the covering provider during after hours 7P-7A, please log into the web site [email or web address] and access using universal [place] password for that web site. If you do not have the password, please call the hospital operator.  11/03/2022, 4:16 PM

## 2022-11-03 NOTE — Significant Event (Signed)
Rapid Response Event Note   Reason for Call :  Second set of eyes  Initial Focused Assessment:  A&O male lying in bed about to eat breakfast, *pt is blind. Lungs diminished, heart tones normal. Some edema present. Skin warm and dry. CT in place on left. Patient endorses pain and being uncomfortable since procedure.   81/57 (67) HR 66 RR 21 O2 100% [place] 4L  From chart review pt SBP normally 90s-low 100s. Since CT insertion yesterday remained 70s-90s. States he is dizzy though endorses this is not new since procedure.   Interventions/Plan of Care:  -Nephro MD at bedside for AM rounds, aware of softer SBP since yesterday--making adjustments to midodrine that was added yesterday. Plan to reassess prior to HD and need for IVF bolus.  -Notify primary MD -may need to adjust pain medications  Event Summary:  MD Notified: Fatima Sanger MD, D. Janee Morn MD Call Time: [phone] Arrival Time: 0748 End Time: 0805  Truddie Crumble, RN

## 2022-11-03 NOTE — Progress Notes (Signed)
NAME:  VELTON BOU, MRN:  [id number], DOB:  1970-09-27, LOS: 7 ADMISSION DATE:  10/27/2022, CONSULTATION DATE:  10/28/2022 REFERRING MD:  Roney Mans, CHIEF COMPLAINT:  pleural effusion.   History of Present Illness:  52 year old man who is on hemodialysis with a left pleural effusion. He has had general decline in the state of health over the last several months with loss of appetite. He had a relatively abrupt onset of left chest discomfort which she like into a sensation of trapped gas in the left side with a pleuritic component.  Mild cough and somewhat increased shortness of breath.  Some subjective fever and chills.  CT shows a moderate to large loculated left pleural effusion.  Pertinent  Medical History   Past Medical History:  Diagnosis Date   Arthritis    hands and shoulders   Blindness and low vision    "Stargardt disease"   Dissection of aorta (HCC) 2004   a. s/p extensive repeair in 2004 in [place] complicated by ESRD, lower extremity paralysis, coma, and extended hospitalization of 2 years   ESRD (end stage renal disease) (HCC)    a. TTS   Headache    History of cardioversion 2014   Hypertension    Neuropathy    Non-healing non-surgical wound 03/2016   PAF (paroxysmal atrial fibrillation) (HCC)    a. s/p DCCV in 2014; b. on Coumadin; c. CHADS2VASc => 2 (HTN, vascular disease)   Paralysis (HCC)    due to dissection of aorta in 2004, lower extremities   Pneumonia      Significant Hospital Events: Including procedures, antibiotic start and stop dates in addition to other pertinent events   7/25 - CT shows loculated left pleural effusion with associated consolidation.  No evidence of lymphadenopathy or mass. 7/28 No acute issues overnight, remains on 3L [place]. INR remains elevated at 2.5, Vitamin K given.   7/31 chest tube placed  Interim History / Subjective:   ++ pain from chest tube insertion site.  Drained total approx .   Objective   Blood pressure (!)  78/51, pulse 64, temperature 98.2 F (36.8 C), temperature source Oral, resp. rate 18, height 6\' 3"  (1.905 m), weight 63 kg, SpO2 100%.        Intake/Output Summary (Last 24 hours) at 11/03/2022 1331 Last data filed at 11/03/2022 0720 Gross per 24 hour  Intake 0 ml  Output 90 ml  Net -90 ml   Filed Weights   10/29/22 1146 11/01/22 0744 11/01/22 1030  Weight: 61.6 kg 63.6 kg 63 kg    Examination: General: Acute on chronically ill appearing male lying in bed, appears uncomfortable. HEENT: [place]/AT, MM pink/moist, PERRL,  Neuro: Alert and oriented x3, non-focal  CV: s1s2 regular rate and rhythm, no murmur, rubs, or gallops,  PULM: chest tube in place. Breath sound equal. GI: soft, bowel sounds active in all 4 quadrants, non-tender, non-distended, tolerating oral diet Extremities: warm/dry, no edema  Skin: no rashes or lesions  Minimal residual effusion on POC [place]. I couldn't see any additional pockets.     Ancillary Tests personally reviewed  WBC 12.0 Fluid analysis consistent with exudative parapneumonic effusion. No organisms present.  CXR shows excellent tube position at the base of the lung, but persistent consolidation.   Assessment & Plan:  Loculated left pleural effusion with associated consolidation, likely parapneumonic effusion secondary to CAP End-stage renal disease on hemodialysis Unexplained weight loss Calciphylaxis  Plan:  - He has surprisingly few symptoms to suggest  infection ( not toxic looking, minimal cough etc), but treatment for CAP indicated as still most likely diagnosis despite atypical presentation.  - Would give 10 days of ceftriaxone and 5 of azithromycin. - Effusion appears well drained at present based on [place]. Will obtain CT chest tomorrow to confirm and decide whether tube can come out or if patient might need IP lytics or a second chest tube.  - Continue HD per nephrology   Best Practice (right click and "Reselect all SmartList Selections" daily)   Per primary   Lynnell Catalan, MD [facility] ICU Physician [facility] [place] [facility]  Pager: [phone] Or Epic Secure Chat After hours: [phone].  11/03/2022, 1:46 PM      11/03/2022, 1:31 PM

## 2022-11-03 NOTE — Progress Notes (Signed)
[facility] Progress Note   Subjective:  Seen in room.  Had CT placed yesterday.  BP has been lower since 70-80s overnight and he's having some dizziness.  BP was lower after pain medication given.  Hb stable, no bloody chest tube outpt noted.  PO intake is fairly minimal he reports.  Had sorbitol yesterday and many BMs overnight.    Objective Vitals:   11/03/22 0300 11/03/22 0600 11/03/22 0709 11/03/22 0800  BP: 103/60 (!) 92/59 (!) 74/48 (!) 81/57  Pulse: 71 68 67 66  Resp: 20 (!) 22 (!) 22 (!) 21  Temp: 98.2 F (36.8 C)  98.2 F (36.8 C)   TempSrc: Oral  Oral   SpO2: 93% 93% 93% 100%  Weight:      Height:       Physical Exam General: Chronically ill appearing man, NAD. Nasal O2 in place. Awake and alert.  Heart: RRR; no murmur Lungs: L chest tube in place, clear anteriorly Abdomen: soft Extremities: chronic woody changes without pitting edema Dialysis Access: [facility]  c/d/i  Additional Objective Labs: Basic Metabolic Panel: Recent Labs  Lab 10/31/22 0056 11/01/22 0139 11/02/22 0905 11/03/22 0316  NA 135 135 136 136  K 4.2 4.8 3.9 4.0  CL 95* 96* 95* 97*  CO2 26 23 25 25   GLUCOSE 92 95 125* 96  BUN 43* 52* 35* 38*  CREATININE 7.08* 8.47* 6.27* 7.04*  CALCIUM 7.0* 6.3* 6.3* 6.2*  PHOS 3.6 3.7  --  3.1   Liver Function Tests: Recent Labs  Lab 10/31/22 0056 11/01/22 0139 11/02/22 0905 11/02/22 1743 11/03/22 0316  AST 6* 6* 7*  --   --   ALT 8 7 5   --   --   ALKPHOS 205* 209* 211*  --   --   BILITOT 0.7 0.4 0.4  --   --   PROT 6.1* 6.2* 6.0* 6.4*  --   ALBUMIN 2.4* 2.4* 2.4*  --  2.7*   Recent Labs  Lab 10/27/22 1302  LIPASE 33   CBC: Recent Labs  Lab 10/31/22 0056 11/01/22 0139 11/02/22 0905 11/02/22 1743 11/03/22 0316  WBC 11.1* 11.3* 12.6* 12.2* 12.0*  NEUTROABS 9.5* 10.0* 11.1*  --  10.4*  HGB 9.8* 9.7* 9.3* 9.9* 9.8*  HCT 32.7* 32.6* 31.3* 32.9* 32.8*  MCV 100.0 97.3 97.2 96.8 97.3  PLT 190 208 184 192 217    Medications:  azithromycin     cefTRIAXone (ROCEPHIN)  IV      (feeding supplement) PROSource Plus  30 mL Oral BID BM   amiodarone  200 mg Oral Daily   Chlorhexidine Gluconate Cloth  6 each Topical Daily   darbepoetin (ARANESP) injection - DIALYSIS  40 mcg Subcutaneous Q Sat-1800   hydrocerin   Topical Daily   lidocaine  3 patch Transdermal Q24H   midodrine  10 mg Oral TID WC   polyethylene glycol  17 g Oral BID   senna-docusate  1 tablet Oral BID   sevelamer carbonate  4.8 g Oral TID WC   sodium chloride flush  10 mL Intrapleural Q8H    Dialysis Orders: TTS [facility] 4hr, 2K/2Ca, UFP #4, TDC, EDW 66kg, no heparin (allergy) - locks TDC with CaCitrate - No ESA or VDRA - Binder: Renvela pwdr 4.8g TID - Sensipar 180mg  every day at home   Assessment/Plan: Acute hypoxic respiratory failure: In setting of missed HD. CXR with pleural effusion s/p chest tube 7/31. LE now resolved. Loculated L pleural effusion: s/p thoracentesis  with cultures.  Afebrile.  ESRD: Continue usual TTS schedule - HD planned today but this AM BP too low - albumin bolus + increase midodrine and reassess later in the day if he'll tolerate.  BP/volume: BP low, edema resolved and now well below prior EDW.  Low po intake for days and with BP low today will give 25g albumin bolus now.  Increase midodrine 10 TID. Anemia of ESRD:  Hgb 9s - s/p Aranesp on 7/27.  Hb stable this AM.  Secondary HPTH: Ca/Phos ok - continue home binders.  Held cinacalcet due to hypoCa - consider resuming at lower dose (was 180 daily) if Ca improves. Nutrition: Alb low, continue supplements. A-fib: On amiodarone + warfarin (on hold for procedure), has IVC filter Hx type B aortic dissection s/p repair (2004) Hypotension:  baseline low BP now much lower s/p chest tube placement -- afebrile.  Suspect pain meds playing role.  Giving albumin for volume support + inc midodrine.  Further w/u per primary.    Estill Bakes MD [facility] Pager [phone]

## 2022-11-04 ENCOUNTER — Encounter (HOSPITAL_COMMUNITY): Payer: Self-pay | Admitting: Pulmonary Disease

## 2022-11-04 DIAGNOSIS — I714 Abdominal aortic aneurysm, without rupture, unspecified: Secondary | ICD-10-CM | POA: Diagnosis not present

## 2022-11-04 DIAGNOSIS — K5900 Constipation, unspecified: Secondary | ICD-10-CM | POA: Diagnosis not present

## 2022-11-04 DIAGNOSIS — J918 Pleural effusion in other conditions classified elsewhere: Secondary | ICD-10-CM

## 2022-11-04 DIAGNOSIS — J90 Pleural effusion, not elsewhere classified: Secondary | ICD-10-CM | POA: Diagnosis not present

## 2022-11-04 DIAGNOSIS — J189 Pneumonia, unspecified organism: Secondary | ICD-10-CM

## 2022-11-04 DIAGNOSIS — E875 Hyperkalemia: Secondary | ICD-10-CM | POA: Diagnosis not present

## 2022-11-04 MED ORDER — ALBUMIN HUMAN 25 % IV SOLN
INTRAVENOUS | Status: AC
  Administered 2022-11-04: 25 g
  Filled 2022-11-04: qty 100

## 2022-11-04 MED ORDER — SORBITOL 70 % SOLN: 30.0000 mL | Status: AC

## 2022-11-04 MED ORDER — ANTICOAGULANT SODIUM CITRATE 4% (200MG/5ML) IV SOLN
5.0000 mL | Status: DC | PRN
  Administered 2022-11-07 – 2022-11-15 (×2): 3.8 mL
  Administered 2022-11-18 – 2022-11-29 (×3): 5 mL
  Filled 2022-11-04 (×10): qty 5

## 2022-11-04 MED ORDER — CALCIUM GLUCONATE-NACL 2-0.675 GM/100ML-% IV SOLN
2.0000 g | Freq: Once | INTRAVENOUS | Status: AC
  Administered 2022-11-05: 2000 mg via INTRAVENOUS
  Filled 2022-11-04: qty 100

## 2022-11-04 MED ORDER — MIDODRINE HCL 5 MG PO TABS
15.0000 mg | ORAL_TABLET | Freq: Three times a day (TID) | ORAL | Status: DC
  Administered 2022-11-04 – 2022-11-29 (×75): 15 mg via ORAL
  Filled 2022-11-04 (×76): qty 3

## 2022-11-04 MED ORDER — ALBUMIN HUMAN 25 % IV SOLN: 25.0000 g | Freq: Once | INTRAVENOUS | Status: DC

## 2022-11-04 NOTE — Progress Notes (Signed)
NAME:  Corey [name], MRN:  [id number], DOB:  06-Aug-1970, LOS: 8 ADMISSION DATE:  10/27/2022, CONSULTATION DATE:  10/28/2022 REFERRING MD:  Roney Mans, CHIEF COMPLAINT:  pleural effusion.   History of Present Illness:  52 year old man who is on hemodialysis with a left pleural effusion. He has had general decline in the state of health over the last several months with loss of appetite. He had a relatively abrupt onset of left chest discomfort which she like into a sensation of trapped gas in the left side with a pleuritic component.  Mild cough and somewhat increased shortness of breath.  Some subjective fever and chills.  CT shows a moderate to large loculated left pleural effusion.  Pertinent  Medical History   Past Medical History:  Diagnosis Date   Arthritis    hands and shoulders   Blindness and low vision    "Stargardt disease"   Dissection of aorta (HCC) 2004   a. s/p extensive repeair in 2004 in [place] complicated by ESRD, lower extremity paralysis, coma, and extended hospitalization of 2 years   ESRD (end stage renal disease) (HCC)    a. TTS   Headache    History of cardioversion 2014   Hypertension    Neuropathy    Non-healing non-surgical wound 03/2016   PAF (paroxysmal atrial fibrillation) (HCC)    a. s/p DCCV in 2014; b. on Coumadin; c. CHADS2VASc => 2 (HTN, vascular disease)   Paralysis (HCC)    due to dissection of aorta in 2004, lower extremities   Pneumonia      Significant Hospital Events: Including procedures, antibiotic start and stop dates in addition to other pertinent events   7/25 - CT shows loculated left pleural effusion with associated consolidation.  No evidence of lymphadenopathy or mass. 7/28 No acute issues overnight, remains on 3L [place]. INR remains elevated at 2.5, Vitamin K given.   7/31 chest tube placed  Interim History / Subjective:  No overnight events No significant drainage from chest tube  Objective   Blood pressure (!) 86/57, pulse  68, temperature 97.6 F (36.4 C), temperature source Oral, resp. rate 20, height 6\' 3"  (1.905 m), weight 63 kg, SpO2 94%.        Intake/Output Summary (Last 24 hours) at 11/04/2022 0852 Last data filed at 11/04/2022 6578 Gross per 24 hour  Intake 869.14 ml  Output 20 ml  Net 849.14 ml   Filed Weights   10/29/22 1146 11/01/22 0744 11/01/22 1030  Weight: 61.6 kg 63.6 kg 63 kg    Examination: General: Chronically ill-appearing HEENT: Moist oral mucosa Neuro: Alert and oriented x 3 CV: S1-S2 appreciated PULM: Fair air entry bilaterally, decreased at the bases, chest tube in place GI: Soft, bowel sounds appreciated Extremities: warm/dry, no edema  Skin: no rashes or lesions  Minimal residual effusion on POC [place]. I couldn't see any additional pockets. 8/1    Ancillary Tests personally reviewed  WBC 12.0 Fluid analysis consistent with exudative parapneumonic effusion. No organisms present.  CXR shows excellent tube position at the base of the lung, but persistent consolidation.   Assessment & Plan:   Loculated left pleural effusion with associated consolidation Parapneumonic effusion End-stage renal disease on hemodialysis Unexplained weight loss Calciphylaxis  No significant drainage from chest tube at present -CT scan of the chest is pending today -If no significant effluent from the tube and no significant collection, may plan to discontinue chest tube today  Continue hemodialysis per nephrology  Will continue to  follow   Virl Diamond, MD [employer] PCCM Pager: See Loretha Stapler

## 2022-11-04 NOTE — Progress Notes (Signed)
PROGRESS NOTE    Corey [name]  ZOX:[id number] DOB: [birth year] DOA: 10/27/2022 PCP: [name], NP   Chief Complaint  Patient presents with   Flank Pain   Shoulder Pain    Brief Narrative: Corey [name] is a 52 y.o. male with medical history significant of end-stage renal disease on hemodialysis TTS, paroxysmal atrial fibrillation on chronic Coumadin therapy, history of previous aortic dissection, peripheral vascular disease, history of osteoarthritis, history of neuropathy, who missed hemodialysis twice secondary to nausea vomiting and diarrhea.  Patient came to the ER with shortness of breath. Was found to be hypoxic with new oxygen requirement.  Also significant hyperkalemia.  Nephrology was consulted and patient is being dialyzed.  As part of his workup patient was found to have complex right-sided pleural effusion that looks loculated.  There is worried that this represents possible infection versus other loculated pleural effusion.  Patient is therefore being admitted to the hospital.  Patient currently being dialyzed as indicated.    Currently awaiting improvement in INR for chest tube placement.    Vascular was consulted for PVD.     Assessment & Plan:   Principal Problem:   Pleural effusion Active Problems:   ESRD on dialysis [facility])   Essential hypertension   Chronic diastolic CHF (congestive heart failure) (HCC)   Atrial fibrillation, chronic (HCC)   End-stage renal disease on hemodialysis (HCC)   AAA (abdominal aortic aneurysm) without rupture (HCC)   Hyperkalemia   PVD (peripheral vascular disease) (HCC)   Constipation   Lobar pneumonia (HCC)   Parapneumonic effusion  #1 acute hypoxic respiratory failure secondary to large loculated left pleural effusion/parapneumonic effusion. -Patient noted to have presented with shortness of breath noted to be hypoxic with new oxygen requirement. -Patient seen in consultation by nephrology underwent hemodialysis with no  significant improvement with shortness of breath. -Patient noted to have a complex left-sided pleural effusion which was concerning for loculation. -PCCM consulted and following and was awaiting for improvement with INR prior to chest tube placement and further evaluation. -Patient subsequently underwent chest tube placement per PCCM on 11/02/2022.  -Pleural fluid analysis according to lights criteria consistent with a exudative parapneumonic effusion.  -Pleural fluid cultures and Gram stain pending.  -Patient to have repeat CT chest today for further evaluation per pulmonary. -Pulmonary recommending 10-day course of Rocephin and 5-day course of azithromycin. -PCCM following and appreciate input and recommendations.  2.  Left-sided pneumonia -Noted on chest x-ray.  Patient also noted to have a loculated left pleural effusion. -Patient underwent chest tube placement, pleural fluid analysis consistent with an exudative parapneumonic effusion. -Sputum Gram stain and culture pending.  -Blood cultures pending.  -Continue IV Rocephin, IV azithromycin. -PCCM/pulmonary following.  3.  ESRD -On HD. -Cinacalcet on hold due to concerns for hypoxia per nephrology. -Patient was to have HD yesterday however due to hypotension and adjustment in midodrine patient did not have hemodialysis yesterday.  Patient was to have hemodialysis last night but declined at that time due to blood pressure being low and concerned that he might have a syncopal episode.   -IV albumin x 1 today.   -Patient for HD today. -Per nephrology.  4.  Hypotension -IV albumin x 1 ordered and midodrine dose changed to 10 mg 3 times daily on 11/03/2022 per nephrology.   -Patient still hypotensive and as such IV albumin x 1 will be ordered.  -Midodrine changed to 15 mg p.o. 3 times daily per nephrology.   5.  PVD/bilateral lower extremities with chronic skin changes related to vascular disease -Patient seen in consultation by vascular  surgery who recommended Eucerin daily, outpatient follow-up with vascular surgery, outpatient follow-up.  Travel team.  6.  Hyperkalemia -Resolved.  7.  Chronic diastolic heart failure -Volume management with HD. -Supportive care.  8.  Constipation -Patient states had small bowel movement yesterday after sorbitol was given.   -Increase MiraLAX to twice daily.   -Continue Senokot-S twice daily.   -Sorbitol p.o. every 3 hours x 2 doses.    9.  Hyperkalemia -Resolved. -On HD.  10.  Paroxysmal atrial fibrillation -Continue amiodarone for rate control. -Coumadin held for chest tube placement/thoracentesis which was done 11/02/2022.  -Pulmonary to advise when anticoagulation may be resumed.  11.  Chronic diastolic CHF -Volume being managed via hemodialysis. -Patient noted to have refused hemodialysis last night due to low blood pressure however patient said for hemodialysis today. -2D echo 2020 with a EF of 60 to 65%, increased LV wall thickness.  12.  Hypertension>>> hypotension -BP noted this morning with systolics in the 80s. -Patient not taking metoprolol at home. -IV albumin ordered per nephrology and patient midodrine changed to 10 mg 3 times daily per nephrology on 11/03/2022.   -Will order dose of IV albumin x 1 today.   -Midodrine changed to 15 mg p.o. 3 times daily per nephrology today.   13.  Hypocalcemia -Corrected calcium of 7.34.   -Calcium gluconate 2 g IV x 1.      DVT prophylaxis: INR noted at 1.5 on 11/03/2022.  Code Status: Full Family Communication: Updated patient.  No family at bedside. Disposition: TBD  Status is: Inpatient Remains inpatient appropriate because: Severity of illness   Consultants:  Nephrology: Dr. Arlean Hopping 10/27/2022 PCCM: Dr. Denese Killings 10/28/2022 Vascular surgery: Dr. Edilia Bo 10/30/2022  Procedures:  CT angiogram chest abdomen and pelvis 11/02/2023 CT chest abdomen and pelvis 10/27/2019 Chest x-ray 10/27/2022 Vascular ultrasound with  ABIs 11/01/2022 30 insertion with image guidance/chest tube placement: Per PCCM, Dr. Denese Killings 11/02/2022  Antimicrobials:  Anti-infectives (From admission, onward)    Start     Dose/Rate Route Frequency Ordered Stop   11/03/22 0915  cefTRIAXone (ROCEPHIN) 2 g in sodium chloride 0.9 % 100 mL IVPB        2 g 200 mL/hr over 30 Minutes Intravenous Every 24 hours 11/03/22 0823 11/08/22 0914   11/03/22 0915  azithromycin (ZITHROMAX) 500 mg in sodium chloride 0.9 % 250 mL IVPB        500 mg 250 mL/hr over 60 Minutes Intravenous Every 24 hours 11/03/22 0823 11/08/22 0914         Subjective: Patient laying in bed.  Arousable.  Some complaints of shortness of breath.  States left upper quadrant abdominal pain improving however still not gone.  Denies any chest pain.  Stated had some small hard stool yesterday after laxative was given.  Hesitant to have hemodialysis last night as his blood pressure was low and he was concerned of syncopal episode.    Objective: Vitals:   11/04/22 0338 11/04/22 0400 11/04/22 0500 11/04/22 0826  BP: (!) 87/56 (!) 85/55 (!) 86/55 (!) 86/57  Pulse: 60 (!) 59 60 68  Resp: 20 20 18 20   Temp: 97.8 F (36.6 C)   97.6 F (36.4 C)  TempSrc: Oral   Oral  SpO2: 100% 99% 100% 94%  Weight:      Height:        Intake/Output Summary (Last 24 hours) at 11/04/2022  1047 Last data filed at 11/04/2022 1003 Gross per 24 hour  Intake 879.14 ml  Output 20 ml  Net 859.14 ml   Filed Weights   10/29/22 1146 11/01/22 0744 11/01/22 1030  Weight: 61.6 kg 63.6 kg 63 kg    Examination:  General exam: Appears calm and comfortable  Respiratory system: Decreased breath sounds in the left base.  Scattered coarse breath sounds.  No wheezing.  Left-sided chest tube in place.  Cardiovascular system: Regular rate rhythm no murmurs rubs or gallops.  No JVD.  Chronic woody changes of lower extremities without pitting edema.   Gastrointestinal system: Abdomen is soft, nondistended,  decreased tenderness to palpation left upper quadrant.  Positive bowel sounds.  No rebound.  No guarding.  Central nervous system: Alert and oriented. No focal neurological deficits. Extremities: Symmetric 5 x 5 power. Skin: No rashes, lesions or ulcers Psychiatry: Judgement and insight appear normal. Mood & affect appropriate.     Data Reviewed: I have personally reviewed following labs and imaging studies  CBC: Recent Labs  Lab 10/31/22 0056 11/01/22 0139 11/02/22 0905 11/02/22 1743 11/03/22 0316 11/04/22 0122  WBC 11.1* 11.3* 12.6* 12.2* 12.0* 13.3*  NEUTROABS 9.5* 10.0* 11.1*  --  10.4* 11.3*  HGB 9.8* 9.7* 9.3* 9.9* 9.8* 9.6*  HCT 32.7* 32.6* 31.3* 32.9* 32.8* 31.9*  MCV 100.0 97.3 97.2 96.8 97.3 97.6  PLT 190 208 184 192 217 204    Basic Metabolic Panel: Recent Labs  Lab 10/29/22 0048 10/30/22 0052 10/31/22 0056 11/01/22 0139 11/02/22 0905 11/03/22 0316 11/04/22 0122  NA 138 133* 135 135 136 136 136  K 4.8 4.1 4.2 4.8 3.9 4.0 4.0  CL 96* 94* 95* 96* 95* 97* 98  CO2 29 27 26 23 25 25 25   GLUCOSE 105* 105* 92 95 125* 96 110*  BUN 54* 31* 43* 52* 35* 38* 45*  CREATININE 8.68* 5.76* 7.08* 8.47* 6.27* 7.04* 7.84*  CALCIUM 8.2* 7.8* 7.0* 6.3* 6.3* 6.2* 6.3*  MG 2.0 1.8 1.9 1.9  --   --   --   PHOS 4.6 3.6 3.6 3.7  --  3.1 3.2    GFR: Estimated Creatinine Clearance: 9.8 mL/min (A) (by C-G formula based on SCr of 7.84 mg/dL (H)).  Liver Function Tests: Recent Labs  Lab 10/29/22 0048 10/30/22 0052 10/31/22 0056 11/01/22 0139 11/02/22 0905 11/02/22 1743 11/03/22 0316 11/04/22 0122  AST 6* 8* 6* 6* 7*  --   --   --   ALT 7 6 8 7 5   --   --   --   ALKPHOS 220* 213* 205* 209* 211*  --   --   --   BILITOT 0.6 0.6 0.7 0.4 0.4  --   --   --   PROT 5.9* 6.1* 6.1* 6.2* 6.0* 6.4*  --   --   ALBUMIN 2.1* 2.6* 2.4* 2.4* 2.4*  --  2.7* 2.7*    CBG: No results for input(s): "GLUCAP" in the last 168 hours.    Recent Results (from the past 240 hour(s))  MRSA  Next Gen by PCR, Nasal     Status: None   Collection Time: 10/28/22 12:03 AM   Specimen: Nasal Mucosa; Nasal Swab  Result Value Ref Range Status   MRSA by PCR Next Gen NOT DETECTED NOT DETECTED Final    Comment: (NOTE) The GeneXpert MRSA Assay (FDA approved for NASAL specimens only), is one component of a comprehensive MRSA colonization surveillance program. It is not intended to  diagnose MRSA infection nor to guide or monitor treatment for MRSA infections. Test performance is not FDA approved in patients less than 29 years old. Performed at [facility], [address], [place], [place] [address]   Body fluid culture w Gram Stain     Status: None (Preliminary result)   Collection Time: 11/02/22  4:44 PM   Specimen: Pleural Fluid  Result Value Ref Range Status   Specimen Description FLUID PLEURAL  Final   Special Requests NONE  Final   Gram Stain   Final    RARE WBC PRESENT, PREDOMINANTLY PMN NO ORGANISMS SEEN    Culture   Final    NO GROWTH 2 DAYS Performed at [facility], [address], [place], [place] [address]    Report Status PENDING  Incomplete  Culture, blood (routine x 2) Call MD if unable to obtain prior to antibiotics being given     Status: None (Preliminary result)   Collection Time: 11/03/22  8:49 AM   Specimen: BLOOD LEFT ARM  Result Value Ref Range Status   Specimen Description BLOOD LEFT ARM  Final   Special Requests   Final    BOTTLES DRAWN AEROBIC AND ANAEROBIC Blood Culture adequate volume   Culture   Final    NO GROWTH < 24 HOURS Performed at [facility], [address], [place], [place] [address]    Report Status PENDING  Incomplete  Culture, blood (routine x 2) Call MD if unable to obtain prior to antibiotics being given     Status: None (Preliminary result)   Collection Time: 11/03/22  8:55 AM   Specimen: BLOOD LEFT ARM  Result Value Ref Range Status   Specimen Description BLOOD LEFT ARM  Final   Special Requests   Final     BOTTLES DRAWN AEROBIC AND ANAEROBIC Blood Culture adequate volume   Culture   Final    NO GROWTH < 24 HOURS Performed at [facility], [address] [place], [place] [address]    Report Status PENDING  Incomplete         Radiology Studies: DG CHEST PORT 1 VIEW  Result Date: 11/02/2022 CLINICAL DATA:  Status post chest tube placement EXAM: PORTABLE CHEST 1 VIEW COMPARISON:  Chest x-ray 10/27/2022 FINDINGS: New left pleural drainage catheter is seen in the lower left hemithorax. Small left pleural effusion persists. Tiny component of pneumothorax is seen in the left costophrenic angle adjacent to the catheter likely related to recent catheter placement. There is a small left pleural effusion which is unchanged. Left mid and lower lung airspace disease and right basilar atelectasis appear stable. There is diffuse interstitial prominence throughout both lungs similar to the prior study. The cardiomediastinal silhouette is unchanged, the aorta is tortuous in the heart is enlarged. Right-sided central venous catheter tip projects over the distal SVC. Vascular stent is seen in the right upper arm. No acute fractures are identified. IMPRESSION: 1. New left pleural drainage catheter in place with persistent small left pleural effusion and tiny pneumothorax in the left costophrenic angle. 2. Stable left mid and lower lung airspace disease and right basilar atelectasis. 3. Stable diffuse interstitial prominence throughout both lungs. Electronically Signed   By: Darliss Cheney M.D.   On: 11/02/2022 17:23        Scheduled Meds:  (feeding supplement) PROSource Plus  30 mL Oral BID BM   amiodarone  200 mg Oral Daily   Chlorhexidine Gluconate Cloth  6 each Topical Daily   darbepoetin (ARANESP) injection - DIALYSIS  40 mcg Subcutaneous Q Sat-1800   hydrocerin   Topical Daily   lidocaine  3 patch Transdermal Q24H   midodrine  15 mg Oral TID WC   polyethylene glycol  17 g Oral BID   senna-docusate   1 tablet Oral BID   sevelamer carbonate  4.8 g Oral TID WC   sodium chloride flush  10 mL Intrapleural Q8H   Continuous Infusions:  albumin human     azithromycin Stopped (11/03/22 1815)   calcium gluconate     cefTRIAXone (ROCEPHIN)  IV 2 g (11/04/22 1002)     LOS: 8 days    Time spent: 40 minutes    Ramiro Harvest, MD [facility]   To contact the attending provider between 7A-7P or the covering provider during after hours 7P-7A, please log into the web site [email or web address] and access using universal [place] password for that web site. If you do not have the password, please call the hospital operator.  11/04/2022, 10:47 AM

## 2022-11-04 NOTE — Progress Notes (Signed)
   11/04/22 1945  Vitals  Temp (!) 97.5 F (36.4 C)  Temp Source Oral  BP 110/87  MAP (mmHg) 94  BP Location Left Arm  BP Method Automatic  Patient Position (if appropriate) Lying  Pulse Rate 77  Pulse Rate Source Monitor  ECG Heart Rate 69  Resp (!) 29  Oxygen Therapy  SpO2 100 %  O2 Device Nasal Cannula  O2 Flow Rate (L/min) 4.5 L/min  Post Treatment  Dialyzer Clearance Lightly streaked  Duration of HD Treatment -hour(s) 3 hour(s)  Hemodialysis Intake (mL) 0 mL  Liters Processed 62.5  Fluid Removed (mL) 1000 mL  Tolerated HD Treatment Yes  Post-Hemodialysis Comments tx completed toalerated well  Hemodialysis Catheter Right Subclavian Double-lumen  Placement Date/Time: 12/09/15 0900   Maximum sterile barrier precautions: Hand hygiene;Mask  Site Prep: Chlorhexidine  Orientation: Right  Access Location: Subclavian  Hemodialysis Catheter Type: Double-lumen  Site Condition No complications  Blue Lumen Status Blood return noted  Red Lumen Status Blood return noted  Purple Lumen Status N/A  Catheter fill solution 4% Sodium Citrate  Catheter fill volume (Arterial) 1.9 cc  Catheter fill volume (Venous) 1.9  Dressing Type Transparent  Dressing Status Antimicrobial disc in place  Interventions New dressing  Drainage Description None  Dressing Change Due 11/11/22  Post treatment catheter status Capped and Clamped   Received patient in bed to unit.  Alert and oriented.  Informed consent signed and in chart.   TX duration:  Patient tolerated well.  Transported back to the room  Alert, without acute distress.  Hand-off given to patient's nurse.   Access used: hd catheter Access issues: none  Total UF removed: 1000 Medication(s) given: albumin Post HD VS: see above Post HD weight: 63.4   [facility]

## 2022-11-04 NOTE — Progress Notes (Addendum)
Patient Refusing dialysis tonight. Per Dialysis RN, they will try this AM. BP still @80sys .

## 2022-11-04 NOTE — Progress Notes (Signed)
[name] Progress Note   Subjective:  Seen in room.  Declined dialysis last pm with concern for BP and risk of syncope.  Agreeable to trying today - BP is better than it was yesterday but certainly lower than baseline.  No other new symptoms.  For  Chest CT today eval effusion s/p chest tube.   Objective Vitals:   11/04/22 0338 11/04/22 0400 11/04/22 0500 11/04/22 0826  BP: (!) 87/56 (!) 85/55 (!) 86/55 (!) 86/57  Pulse: 60 (!) 59 60 68  Resp: 20 20 18 20   Temp: 97.8 F (36.6 C)   97.6 F (36.4 C)  TempSrc: Oral   Oral  SpO2: 100% 99% 100% 94%  Weight:      Height:       Physical Exam General: Chronically ill appearing man, NAD. Nasal O2 in place. Awake and alert.  Heart: RRR; no murmur Lungs: L chest tube in place, clear anteriorly Abdomen: soft Extremities: chronic woody changes without pitting edema Dialysis Access: [facility]  c/d/i  Additional Objective Labs: Basic Metabolic Panel: Recent Labs  Lab 11/01/22 0139 11/02/22 0905 11/03/22 0316 11/04/22 0122  NA 135 136 136 136  K 4.8 3.9 4.0 4.0  CL 96* 95* 97* 98  CO2 23 25 25 25   GLUCOSE 95 125* 96 110*  BUN 52* 35* 38* 45*  CREATININE 8.47* 6.27* 7.04* 7.84*  CALCIUM 6.3* 6.3* 6.2* 6.3*  PHOS 3.7  --  3.1 3.2   Liver Function Tests: Recent Labs  Lab 10/31/22 0056 11/01/22 0139 11/02/22 0905 11/02/22 1743 11/03/22 0316 11/04/22 0122  AST 6* 6* 7*  --   --   --   ALT 8 7 5   --   --   --   ALKPHOS 205* 209* 211*  --   --   --   BILITOT 0.7 0.4 0.4  --   --   --   PROT 6.1* 6.2* 6.0* 6.4*  --   --   ALBUMIN 2.4* 2.4* 2.4*  --  2.7* 2.7*   No results for input(s): "LIPASE", "AMYLASE" in the last 168 hours.  CBC: Recent Labs  Lab 11/01/22 0139 11/02/22 0905 11/02/22 1743 11/03/22 0316 11/04/22 0122  WBC 11.3* 12.6* 12.2* 12.0* 13.3*  NEUTROABS 10.0* 11.1*  --  10.4* 11.3*  HGB 9.7* 9.3* 9.9* 9.8* 9.6*  HCT 32.6* 31.3* 32.9* 32.8* 31.9*  MCV 97.3 97.2 96.8 97.3 97.6  PLT 208 184  192 217 204   Medications:  albumin human     azithromycin Stopped (11/03/22 1815)   calcium gluconate     cefTRIAXone (ROCEPHIN)  IV Stopped (11/03/22 1815)    (feeding supplement) PROSource Plus  30 mL Oral BID BM   amiodarone  200 mg Oral Daily   Chlorhexidine Gluconate Cloth  6 each Topical Daily   darbepoetin (ARANESP) injection - DIALYSIS  40 mcg Subcutaneous Q Sat-1800   hydrocerin   Topical Daily   lidocaine  3 patch Transdermal Q24H   midodrine  10 mg Oral TID WC   polyethylene glycol  17 g Oral BID   senna-docusate  1 tablet Oral BID   sevelamer carbonate  4.8 g Oral TID WC   sodium chloride flush  10 mL Intrapleural Q8H    Dialysis Orders: TTS [facility] 4hr, 2K/2Ca, UFP #4, TDC, EDW 66kg, no heparin (allergy) - locks TDC with CaCitrate - No ESA or VDRA - Binder: Renvela pwdr 4.8g TID - Sensipar 180mg  every day at home  Assessment/Plan: Acute hypoxic respiratory failure: In setting of missed HD. CXR with pleural effusion s/p chest tube 7/31. LE now resolved. Loculated L pleural effusion: s/p thoracentesis with cultures.  Afebrile.  CT chest today re eval effusion.  ESRD: Continue usual TTS schedule - HD planned yesterday but AM BP too low --> improved some later in the day but pt declined HD.   Will do HD in room today with albumin bolus + increased midodrine.  Typically a 4h tx, pt agreeing only to 3hr treatment --> will modify and see how he does.  BP/volume: BP low, edema resolved and now well below prior EDW.  Rec'd some albumin boluses yesterday in setting of hypotension and poor po intake.  Increased midodrine 15 TID.  UF 0.5-1L only if tolerating today.  Anemia of ESRD:  Hgb 9s - s/p Aranesp on 7/27.  Hb stable this AM.  Secondary HPTH: Ca/Phos ok - continue home binders.  Held cinacalcet due to hypoCa - consider resuming at lower dose (was 180 daily) if Ca improves. Nutrition: Alb low, continue supplements. A-fib: On amiodarone + warfarin (on hold for  procedure), has IVC filter Hx type B aortic dissection s/p repair (2004) Hypotension:  baseline low BP now much lower s/p chest tube placement -- afebrile.  Suspect pain meds playing role.  Giving albumin for volume support + midodrine.  Further w/u per primary.    Estill Bakes MD [facility] Pager [phone]

## 2022-11-05 ENCOUNTER — Inpatient Hospital Stay (HOSPITAL_COMMUNITY): Payer: 59

## 2022-11-05 DIAGNOSIS — E875 Hyperkalemia: Secondary | ICD-10-CM | POA: Diagnosis not present

## 2022-11-05 DIAGNOSIS — J90 Pleural effusion, not elsewhere classified: Secondary | ICD-10-CM | POA: Diagnosis not present

## 2022-11-05 DIAGNOSIS — K5900 Constipation, unspecified: Secondary | ICD-10-CM | POA: Diagnosis not present

## 2022-11-05 DIAGNOSIS — I714 Abdominal aortic aneurysm, without rupture, unspecified: Secondary | ICD-10-CM | POA: Diagnosis not present

## 2022-11-05 LAB — PROTIME-INR
INR: 1.5 — ABNORMAL HIGH (ref 0.8–1.2)
Prothrombin Time: 18 seconds — ABNORMAL HIGH (ref 11.4–15.2)

## 2022-11-05 MED ORDER — CHLORHEXIDINE GLUCONATE CLOTH 2 % EX PADS
6.0000 | MEDICATED_PAD | Freq: Every day | CUTANEOUS | Status: DC
  Administered 2022-11-06 – 2022-11-11 (×7): 6 via TOPICAL

## 2022-11-05 MED ORDER — STERILE WATER FOR INJECTION IJ SOLN
5.0000 mg | Freq: Once | RESPIRATORY_TRACT | Status: AC
  Administered 2022-11-05: 5 mg via INTRAPLEURAL
  Filled 2022-11-05: qty 5

## 2022-11-05 MED ORDER — BISACODYL 10 MG RE SUPP
10.0000 mg | Freq: Once | RECTAL | Status: DC
  Filled 2022-11-05: qty 1

## 2022-11-05 MED ORDER — SORBITOL 70 % SOLN
30.0000 mL | Status: AC
  Administered 2022-11-05: 30 mL via ORAL
  Filled 2022-11-05: qty 30

## 2022-11-05 MED ORDER — CALCIUM GLUCONATE-NACL 2-0.675 GM/100ML-% IV SOLN
2.0000 g | Freq: Once | INTRAVENOUS | Status: DC
  Filled 2022-11-05: qty 100

## 2022-11-05 MED ORDER — MILK AND MOLASSES ENEMA
1.0000 | Freq: Once | RECTAL | Status: AC
  Administered 2022-11-06: 240 mL via RECTAL
  Filled 2022-11-05: qty 240

## 2022-11-05 MED ORDER — SODIUM CHLORIDE (PF) 0.9 % IJ SOLN
10.0000 mg | Freq: Once | INTRAMUSCULAR | Status: AC
  Administered 2022-11-05: 10 mg via INTRAPLEURAL
  Filled 2022-11-05: qty 10

## 2022-11-05 MED ORDER — SODIUM CHLORIDE 0.9% FLUSH
10.0000 mL | Freq: Three times a day (TID) | INTRAVENOUS | Status: DC
  Administered 2022-11-07 – 2022-11-12 (×13): 10 mL via INTRAPLEURAL

## 2022-11-05 NOTE — Progress Notes (Signed)
[facility] Progress Note   Subjective:  Seen in room.  C/o pleuritic CP related to chest tube.  Very uncomfortable. Repeat chest CT still pending - supposed to be done today.  Afebrile.  Tolerated HD yesterday with 1L UF - wt up but bed weights.  Poor po intake and doesn't feel volume overloaded.  Objective Vitals:   11/04/22 1945 11/04/22 2329 11/05/22 0319 11/05/22 0741  BP: 110/87 (!) 91/59 (!) 94/59 (!) 96/59  Pulse: 77 65 67 68  Resp: (!) 29 (!) 23 20 (!) 27  Temp: (!) 97.5 F (36.4 C) 98.2 F (36.8 C) 98.9 F (37.2 C) 98.6 F (37 C)  TempSrc: Oral Oral Oral Oral  SpO2: 100% 94% 100% 93%  Weight:      Height:       Physical Exam General: Chronically ill appearing man, NAD. Nasal O2 in place. Awake and alert - appearing uncomfortable.  Heart: RRR; no murmur Lungs: L chest tube in place, clear anteriorly Abdomen: soft Extremities: chronic woody changes without pitting edema Dialysis Access: [facility]  c/d/i  Additional Objective Labs: Basic Metabolic Panel: Recent Labs  Lab 11/03/22 0316 11/04/22 0122 11/05/22 0058  NA 136 136 136  K 4.0 4.0 3.7  CL 97* 98 97*  CO2 25 25 26   GLUCOSE 96 110* 112*  BUN 38* 45* 26*  CREATININE 7.04* 7.84* 5.09*  CALCIUM 6.2* 6.3* 6.8*  PHOS 3.1 3.2 2.5   Liver Function Tests: Recent Labs  Lab 10/31/22 0056 11/01/22 0139 11/02/22 0905 11/02/22 1743 11/03/22 0316 11/04/22 0122 11/05/22 0058  AST 6* 6* 7*  --   --   --   --   ALT 8 7 5   --   --   --   --   ALKPHOS 205* 209* 211*  --   --   --   --   BILITOT 0.7 0.4 0.4  --   --   --   --   PROT 6.1* 6.2* 6.0* 6.4*  --   --   --   ALBUMIN 2.4* 2.4* 2.4*  --  2.7* 2.7* 2.8*   No results for input(s): "LIPASE", "AMYLASE" in the last 168 hours.  CBC: Recent Labs  Lab 11/02/22 0905 11/02/22 1743 11/03/22 0316 11/04/22 0122 11/05/22 0058  WBC 12.6* 12.2* 12.0* 13.3* 14.3*  NEUTROABS 11.1*  --  10.4* 11.3* 12.4*  HGB 9.3* 9.9* 9.8* 9.6* 9.6*  HCT 31.3*  32.9* 32.8* 31.9* 31.5*  MCV 97.2 96.8 97.3 97.6 96.3  PLT 184 192 217 204 192   Medications:  albumin human     anticoagulant sodium citrate     azithromycin 500 mg (11/05/22 0827)   calcium gluconate     calcium gluconate     cefTRIAXone (ROCEPHIN)  IV 2 g (11/05/22 1052)    (feeding supplement) PROSource Plus  30 mL Oral BID BM   amiodarone  200 mg Oral Daily   bisacodyl  10 mg Rectal Once   Chlorhexidine Gluconate Cloth  6 each Topical Daily   darbepoetin (ARANESP) injection - DIALYSIS  40 mcg Subcutaneous Q Sat-1800   hydrocerin   Topical Daily   lidocaine  3 patch Transdermal Q24H   midodrine  15 mg Oral TID WC   polyethylene glycol  17 g Oral BID   senna-docusate  1 tablet Oral BID   sevelamer carbonate  4.8 g Oral TID WC   sodium chloride flush  10 mL Intrapleural Q8H   sorbitol  30  mL Oral Q3H    Dialysis Orders: TTS [facility] 4hr, 2K/2Ca, UFP #4, TDC, EDW 66kg, no heparin (allergy) - locks TDC with CaCitrate - No ESA or VDRA - Binder: Renvela pwdr 4.8g TID - Sensipar 180mg  every day at home   Assessment/Plan: Acute hypoxic respiratory failure: In setting of missed HD. CXR with pleural effusion s/p chest tube 7/31. LE now resolved. Loculated L pleural effusion: s/p thoracentesis with cultures.  Afebrile.  CT chest today re eval effusion.  ESRD: usual TTS schedule - declined HD Thurs but did have Friday- UF has been limited by hypotension.  No clear evidence for volume overload and po intake is poor but if CT chest today showing any pulmonary effusion I would recommend extra HD today for fluid removal only - alerted patient of this possibility.  BP/volume: BP low, edema resolved and now well below prior EDW.  Rec'd some albumin boluses yesterday in setting of hypotension and poor po intake.  Increased midodrine 15 TID.  Looks ok but little reserve and don't want to get behind - as above DUF today if CT with edema.  Anemia of ESRD:  Hgb 9s - s/p Aranesp on 7/27.  Hb  stable this AM.  Secondary HPTH: Ca/Phos ok - continue home binders.  Held cinacalcet due to hypoCa - consider resuming at lower dose (was 180 daily) if Ca improves. Nutrition: Alb low, continue supplements. A-fib: On amiodarone + warfarin (on hold for procedure), has IVC filter Hx type B aortic dissection s/p repair (2004) Hypotension:  baseline low BP now much lower s/p chest tube placement -- afebrile.  Suspect pain meds playing role.  Gave albumin for volume support + midodrine - tolerating UF ok.     Estill Bakes MD [facility] Pager [phone]

## 2022-11-05 NOTE — Progress Notes (Signed)
PROGRESS NOTE    [name]  WNU:[id number] DOB: 03-21-1971 DOA: 10/27/2022 PCP: Corey Sessions, NP   Chief Complaint  Patient presents with   Flank Pain   Shoulder Pain    Brief Narrative: Corey [name] is a 52 y.o. male with medical history significant of end-stage renal disease on hemodialysis TTS, paroxysmal atrial fibrillation on chronic Coumadin therapy, history of previous aortic dissection, peripheral vascular disease, history of osteoarthritis, history of neuropathy, who missed hemodialysis twice secondary to nausea vomiting and diarrhea.  Patient came to the ER with shortness of breath. Was found to be hypoxic with new oxygen requirement.  Also significant hyperkalemia.  Nephrology was consulted and patient is being dialyzed.  As part of his workup patient was found to have complex right-sided pleural effusion that looks loculated.  There is worried that this represents possible infection versus other loculated pleural effusion.  Patient is therefore being admitted to the hospital.  Patient currently being dialyzed as indicated.    Currently awaiting improvement in INR for chest tube placement.    Vascular was consulted for PVD.     Assessment & Plan:   Principal Problem:   Pleural effusion Active Problems:   ESRD on dialysis [facility])   Essential hypertension   Chronic diastolic CHF (congestive heart failure) (HCC)   Atrial fibrillation, chronic (HCC)   End-stage renal disease on hemodialysis (HCC)   AAA (abdominal aortic aneurysm) without rupture (HCC)   Hyperkalemia   PVD (peripheral vascular disease) (HCC)   Constipation   Lobar pneumonia (HCC)   Parapneumonic effusion  #1 acute hypoxic respiratory failure secondary to large loculated left pleural effusion/parapneumonic effusion. -Patient noted to have presented with shortness of breath noted to be hypoxic with new oxygen requirement. -Patient seen in consultation by nephrology underwent hemodialysis with no  significant improvement with shortness of breath. -Patient noted to have a complex left-sided pleural effusion which was concerning for loculation. -PCCM consulted and following and was awaiting for improvement with INR prior to chest tube placement and further evaluation. -Patient subsequently underwent chest tube placement per PCCM on 11/02/2022.  -Pleural fluid analysis according to lights criteria consistent with a exudative parapneumonic effusion.  -Pleural fluid cultures and Gram stain pending.  -Patient to have repeat CT chest which is pending.  -Pulmonary recommending 10-day course of Rocephin and 5-day course of azithromycin. -PCCM following and appreciate input and recommendations.  2.  Left-sided pneumonia -Noted on chest x-ray.  Patient also noted to have a loculated left pleural effusion. -Patient underwent chest tube placement, pleural fluid analysis consistent with an exudative parapneumonic effusion. -Sputum Gram stain and culture pending.  -Blood cultures pending.  -Continue IV Rocephin, IV azithromycin. -PCCM/pulmonary following.  3.  ESRD -On HD. -Cinacalcet on hold due to concerns for hypoxia per nephrology. -Patient was to have HD on 11/03/2022, however due to hypotension and adjustment in midodrine patient did not have hemodialysis..  Patient was to have hemodialysis the evening of 11/03/2022, but declined at that time due to blood pressure being low and concerned that he might have a syncopal episode.   -IV albumin x 2 during the hospitalization.  -Patient underwent HD 11/04/2022.  -CT chest pending.  -Nephrology to decide as to whether patient needs another hemodialysis today pending CT scan findings.  -Per nephrology.  4.  Hypotension -Status post IV albumin x 2 and midodrine dose changed to 15 mg 3 times daily on 11/04/2022 per nephrology.   -Patient with improving blood pressure with  systolics in the 90s.    5.  PVD/bilateral lower extremities with chronic skin  changes related to vascular disease -Patient seen in consultation by vascular surgery who recommended Eucerin daily, outpatient follow-up with vascular surgery.  6.  Constipation -Patient still with complaints of constipation.   -Patient noted to have refused MiraLAX and Senokot S on 11/04/2022 however states he took laxatives today.   -Sorbitol p.o. every 3 hours x 2 doses.   -Dulcolax suppository.   -If no BM will need an enema.     7.  Hyperkalemia -Resolved. -On HD.  8.  Paroxysmal atrial fibrillation -Continue amiodarone for rate control. -Coumadin held for chest tube placement/thoracentesis which was done 11/02/2022.  -Pulmonary to advise when anticoagulation may be resumed.  9.  Chronic diastolic CHF -Volume being managed via hemodialysis. -Patient noted to have had hemodialysis yesterday 11/04/2022.  -Patient was on scattered crackles on examination.   -CT chest pending.  -2D echo 2020 with a EF of 60 to 65%, increased LV wall thickness. -Per nephrology may need hemodialysis today if CT scan concerning for volume overload.  10.  Hypertension>>> hypotension -BP noted to have soft blood pressures over the past 2 to 3 days.  -Patient not taking metoprolol at home. -IV albumin ordered per nephrology and patient midodrine changed to 15 mg 3 times daily per nephrology on 11/04/2022.   -BP improving with systolics in the 90s this morning.  11.  Hypocalcemia -Corrected calcium of 7.76.   -Calcium gluconate 2 g IV x 1.      DVT prophylaxis: INR noted at 1.5 on 11/03/2022  Code Status: Full Family Communication: Updated patient.  No family at bedside. Disposition: TBD  Status is: Inpatient Remains inpatient appropriate because: Severity of illness   Consultants:  Nephrology: Dr. Arlean Hopping 10/27/2022 PCCM: Dr. Denese Killings 10/28/2022 Vascular surgery: Dr. Edilia Bo 10/30/2022  Procedures:  CT angiogram chest abdomen and pelvis 11/02/2023 CT chest abdomen and pelvis 10/27/2019 Chest  x-ray 10/27/2022 Vascular ultrasound with ABIs 11/01/2022 30 insertion with image guidance/chest tube placement: Per PCCM, Dr. Denese Killings 11/02/2022 CT chest pending 11/05/2022  Antimicrobials:  Anti-infectives (From admission, onward)    Start     Dose/Rate Route Frequency Ordered Stop   11/03/22 0915  cefTRIAXone (ROCEPHIN) 2 g in sodium chloride 0.9 % 100 mL IVPB        2 g 200 mL/hr over 30 Minutes Intravenous Every 24 hours 11/03/22 0823 11/08/22 0914   11/03/22 0915  azithromycin (ZITHROMAX) 500 mg in sodium chloride 0.9 % 250 mL IVPB        500 mg 250 mL/hr over 60 Minutes Intravenous Every 24 hours 11/03/22 0823 11/08/22 0914         Subjective: Laying in bed.  States just returned from CT chest.  Still with complaints of left-sided pain and some shortness of breath.  Denies any chest pain.  No abdominal pain.  Complains of constipation.  States he took laxatives today.   Objective: Vitals:   11/04/22 1945 11/04/22 2329 11/05/22 0319 11/05/22 0741  BP: 110/87 (!) 91/59 (!) 94/59 (!) 96/59  Pulse: 77 65 67 68  Resp: (!) 29 (!) 23 20 (!) 27  Temp: (!) 97.5 F (36.4 C) 98.2 F (36.8 C) 98.9 F (37.2 C) 98.6 F (37 C)  TempSrc: Oral Oral Oral Oral  SpO2: 100% 94% 100% 93%  Weight:      Height:        Intake/Output Summary (Last 24 hours) at 11/05/2022 1105 Last  data filed at 11/05/2022 0549 Gross per 24 hour  Intake 376.67 ml  Output 1020 ml  Net -643.33 ml   Filed Weights   11/01/22 0744 11/01/22 1030 11/04/22 1518  Weight: 63.6 kg 63 kg 66.4 kg    Examination:  General exam: NAD. Respiratory system: Decreased breath sounds in the left base.  Some scattered diffuse crackles noted.  No wheezing.  Left-sided chest tube in place.   Cardiovascular system: RRR no murmurs rubs or gallops.  No JVD.  Chronic woody changes of lower extremities without pitting edema.     Gastrointestinal system: Abdomen is soft, nondistended, decreased tenderness to palpation left upper  quadrant.  Positive bowel sounds.  No rebound.  No guarding.  Central nervous system: Alert and oriented. No focal neurological deficits. Extremities: Chronic woody changes of lower extremities without pitting edema.    Symmetric 5 x 5 power. Skin: No rashes, lesions or ulcers Psychiatry: Judgement and insight appear normal. Mood & affect appropriate.     Data Reviewed: I have personally reviewed following labs and imaging studies  CBC: Recent Labs  Lab 11/01/22 0139 11/02/22 0905 11/02/22 1743 11/03/22 0316 11/04/22 0122 11/05/22 0058  WBC 11.3* 12.6* 12.2* 12.0* 13.3* 14.3*  NEUTROABS 10.0* 11.1*  --  10.4* 11.3* 12.4*  HGB 9.7* 9.3* 9.9* 9.8* 9.6* 9.6*  HCT 32.6* 31.3* 32.9* 32.8* 31.9* 31.5*  MCV 97.3 97.2 96.8 97.3 97.6 96.3  PLT 208 184 192 217 204 192    Basic Metabolic Panel: Recent Labs  Lab 10/30/22 0052 10/31/22 0056 11/01/22 0139 11/02/22 0905 11/03/22 0316 11/04/22 0122 11/05/22 0058  NA 133* 135 135 136 136 136 136  K 4.1 4.2 4.8 3.9 4.0 4.0 3.7  CL 94* 95* 96* 95* 97* 98 97*  CO2 27 26 23 25 25 25 26   GLUCOSE 105* 92 95 125* 96 110* 112*  BUN 31* 43* 52* 35* 38* 45* 26*  CREATININE 5.76* 7.08* 8.47* 6.27* 7.04* 7.84* 5.09*  CALCIUM 7.8* 7.0* 6.3* 6.3* 6.2* 6.3* 6.8*  MG 1.8 1.9 1.9  --   --   --   --   PHOS 3.6 3.6 3.7  --  3.1 3.2 2.5    GFR: Estimated Creatinine Clearance: 15.9 mL/min (A) (by C-G formula based on SCr of 5.09 mg/dL (H)).  Liver Function Tests: Recent Labs  Lab 10/30/22 0052 10/31/22 0056 11/01/22 0139 11/02/22 0905 11/02/22 1743 11/03/22 0316 11/04/22 0122 11/05/22 0058  AST 8* 6* 6* 7*  --   --   --   --   ALT 6 8 7 5   --   --   --   --   ALKPHOS 213* 205* 209* 211*  --   --   --   --   BILITOT 0.6 0.7 0.4 0.4  --   --   --   --   PROT 6.1* 6.1* 6.2* 6.0* 6.4*  --   --   --   ALBUMIN 2.6* 2.4* 2.4* 2.4*  --  2.7* 2.7* 2.8*    CBG: No results for input(s): "GLUCAP" in the last 168 hours.    Recent Results  (from the past 240 hour(s))  MRSA Next Gen by PCR, Nasal     Status: None   Collection Time: 10/28/22 12:03 AM   Specimen: Nasal Mucosa; Nasal Swab  Result Value Ref Range Status   MRSA by PCR Next Gen NOT DETECTED NOT DETECTED Final    Comment: (NOTE) The GeneXpert MRSA Assay (FDA  approved for NASAL specimens only), is one component of a comprehensive MRSA colonization surveillance program. It is not intended to diagnose MRSA infection nor to guide or monitor treatment for MRSA infections. Test performance is not FDA approved in patients less than 5 years old. Performed at [facility], [address] [place], [place] [address]   Body fluid culture w Gram Stain     Status: None (Preliminary result)   Collection Time: 11/02/22  4:44 PM   Specimen: Pleural Fluid  Result Value Ref Range Status   Specimen Description FLUID PLEURAL  Final   Special Requests NONE  Final   Gram Stain   Final    RARE WBC PRESENT, PREDOMINANTLY PMN NO ORGANISMS SEEN    Culture   Final    NO GROWTH 3 DAYS Performed at [facility], [address], [place], [place] [address]    Report Status PENDING  Incomplete  Acid Fast Smear (AFB)     Status: None   Collection Time: 11/02/22  4:44 PM   Specimen: Pleural, Left; Pleural Fluid  Result Value Ref Range Status   AFB Specimen Processing Concentration  Final   Acid Fast Smear Negative  Final    Comment: (NOTE) Performed At: [facility] [address] [place], [place] [id number] Jolene Schimke MD WG:[id number]    Source (AFB) FLUID  Final    Comment: PLEURAL Performed at [facility], [address], [place], [place] [address]   Culture, blood (routine x 2) Call MD if unable to obtain prior to antibiotics being given     Status: None (Preliminary result)   Collection Time: 11/03/22  8:49 AM   Specimen: BLOOD LEFT ARM  Result Value Ref Range Status   Specimen Description BLOOD LEFT ARM  Final   Special Requests   Final     BOTTLES DRAWN AEROBIC AND ANAEROBIC Blood Culture adequate volume   Culture   Final    NO GROWTH 2 DAYS Performed at [facility], [address] [place], [place] [address]    Report Status PENDING  Incomplete  Culture, blood (routine x 2) Call MD if unable to obtain prior to antibiotics being given     Status: None (Preliminary result)   Collection Time: 11/03/22  8:55 AM   Specimen: BLOOD LEFT ARM  Result Value Ref Range Status   Specimen Description BLOOD LEFT ARM  Final   Special Requests   Final    BOTTLES DRAWN AEROBIC AND ANAEROBIC Blood Culture adequate volume   Culture   Final    NO GROWTH 2 DAYS Performed at [facility], [address] [place], [place] [address]    Report Status PENDING  Incomplete         Radiology Studies: No results found.      Scheduled Meds:  (feeding supplement) PROSource Plus  30 mL Oral BID BM   amiodarone  200 mg Oral Daily   bisacodyl  10 mg Rectal Once   Chlorhexidine Gluconate Cloth  6 each Topical Daily   darbepoetin (ARANESP) injection - DIALYSIS  40 mcg Subcutaneous Q Sat-1800   hydrocerin   Topical Daily   lidocaine  3 patch Transdermal Q24H   midodrine  15 mg Oral TID WC   polyethylene glycol  17 g Oral BID   senna-docusate  1 tablet Oral BID   sevelamer carbonate  4.8 g Oral TID WC   sodium chloride flush  10 mL  Intrapleural Q8H   sorbitol  30 mL Oral Q3H   Continuous Infusions:  albumin human     anticoagulant sodium citrate     azithromycin 500 mg (11/05/22 0827)   calcium gluconate     calcium gluconate     cefTRIAXone (ROCEPHIN)  IV 2 g (11/05/22 1052)     LOS: 9 days    Time spent: 40 minutes    Ramiro Harvest, MD [facility]   To contact the attending provider between 7A-7P or the covering provider during after hours 7P-7A, please log into the web site [email or web address] and access using universal [place] password for that web site. If you do not have the password, please call  the hospital operator.  11/05/2022, 11:05 AM

## 2022-11-05 NOTE — Progress Notes (Signed)
NAME:  Corey [name], MRN:  [id number], DOB:  [birth year], LOS: 9 ADMISSION DATE:  10/27/2022, CONSULTATION DATE:  10/28/2022 REFERRING MD:  Roney Mans, CHIEF COMPLAINT:  pleural effusion.   History of Present Illness:  52 year old man who is on hemodialysis with a left pleural effusion. He has had general decline in the state of health over the last several months with loss of appetite. He had a relatively abrupt onset of left chest discomfort which she like into a sensation of trapped gas in the left side with a pleuritic component.  Mild cough and somewhat increased shortness of breath.  Some subjective fever and chills.  CT shows a moderate to large loculated left pleural effusion.  Pertinent  Medical History   Past Medical History:  Diagnosis Date   Arthritis    hands and shoulders   Blindness and low vision    "Stargardt disease"   Dissection of aorta (HCC) 2004   a. s/p extensive repeair in 2004 in [place] complicated by ESRD, lower extremity paralysis, coma, and extended hospitalization of 2 years   ESRD (end stage renal disease) (HCC)    a. TTS   Headache    History of cardioversion 2014   Hypertension    Neuropathy    Non-healing non-surgical wound 03/2016   PAF (paroxysmal atrial fibrillation) (HCC)    a. s/p DCCV in 2014; b. on Coumadin; c. CHADS2VASc => 2 (HTN, vascular disease)   Paralysis (HCC)    due to dissection of aorta in 2004, lower extremities   Pneumonia      Significant Hospital Events: Including procedures, antibiotic start and stop dates in addition to other pertinent events   7/25 - CT shows loculated left pleural effusion with associated consolidation.  No evidence of lymphadenopathy or mass. 7/28 No acute issues overnight, remains on 3L [place]. INR remains elevated at 2.5, Vitamin K given.   7/31 chest tube placed 8/3 ct chest shows tube is in the lung base with enlarging pleural effusion  Interim History / Subjective:  Sitting up in bed. Says he cannot  breath and feels no better.   Objective   Blood pressure (!) 85/53, pulse 65, temperature 98.6 F (37 C), temperature source Oral, resp. rate (!) 23, height 6\' 3"  (1.905 m), weight 66.4 kg, SpO2 94%.        Intake/Output Summary (Last 24 hours) at 11/05/2022 1612 Last data filed at 11/05/2022 0549 Gross per 24 hour  Intake 376.67 ml  Output 1020 ml  Net -643.33 ml   Filed Weights   11/01/22 0744 11/01/22 1030 11/04/22 1518  Weight: 63.6 kg 63 kg 66.4 kg    Examination: Chronically ill appearing, tachypnic On nasal cannula Breath sounds diminished left lung base No wheeze Faint systolic flow murmur Extremities thin, no edema Left sided chest tube in place with serosanguinous drainage, minimal in the last shift.   Pleural fluid studies reviewed Exudative based on protein/ldh criteria.  No glucose was sent. Will add on today.    Ancillary Tests personally reviewed   Lab Results  Component Value Date   NA 136 11/05/2022   K 3.7 11/05/2022   CO2 26 11/05/2022   GLUCOSE 112 (H) 11/05/2022   BUN 26 (H) 11/05/2022   CREATININE 5.09 (H) 11/05/2022   CALCIUM 6.8 (L) 11/05/2022   EGFR 6 (L) 08/31/2022   GFRNONAA 13 (L) 11/05/2022   Lab Results  Component Value Date   WBC 14.3 (H) 11/05/2022   HGB 9.6 (L) 11/05/2022  HCT 31.5 (L) 11/05/2022   MCV 96.3 11/05/2022   PLT 192 11/05/2022   CT Chest also reviewed.    Assessment & Plan:  Parapneumonic left pleural effusion  End-stage renal disease on hemodialysis Unexplained weight loss Calciphylaxis P: - cytology pending, suspect infectious etiology (empyema still in differential although pleural fluid glucose was not sent - will add on) - CT Chest reviewed - the tip of the pigtail is coiled between atelectatic lung and the diaphragm. Will plan to pull it back. The pleural effusion is persistent to larger. Suspect after pulling back he will benefit from dornase/tpa based on loculations.  Patient is agreeable to proceed.    Durel Salts, MD Pulmonary and Critical Care Medicine [facility] 11/05/2022 4:13 PM Pager: see AMION  If no response to pager, please call critical care on call (see AMION) until 7pm After 7:00 pm call Elink

## 2022-11-05 NOTE — Procedures (Addendum)
Pleural Fibrinolytic Administration Procedure Note  Corey [name]  [id number]  1970/04/25  Date:11/05/22  Time:4:39 PM   Provider Performing: [name]   Procedure: Pleural Fibrinolysis Initial day [phone])  Indication(s) Fibrinolysis of complicated pleural effusion  Consent Risks of the procedure as well as the alternatives and risks of each were explained to the patient and/or caregiver.  Consent for the procedure was obtained.   Anesthesia None   Time Out Verified patient identification, verified procedure, site/side was marked, verified correct patient position, special equipment/implants available, medications/allergies/relevant history reviewed, required imaging and test results available.   Sterile Technique Hand hygiene, gloves   Procedure Description Existing pleural catheter was cleaned and accessed in sterile manner.  The chest tube was initially pulled back 2 inches and resutured due to position noted on CT.  10mg  of tPA in 30cc of saline and 5mg  of dornase in 30cc of sterile water were injected into pleural space using existing pleural catheter.  Catheter will be clamped for 1 hour and then placed back to suction.   Complications/Tolerance None; patient tolerated the procedure well.  EBL None   Specimen(s) None

## 2022-11-06 ENCOUNTER — Inpatient Hospital Stay (HOSPITAL_COMMUNITY): Payer: 59

## 2022-11-06 DIAGNOSIS — J90 Pleural effusion, not elsewhere classified: Secondary | ICD-10-CM | POA: Diagnosis not present

## 2022-11-06 DIAGNOSIS — I714 Abdominal aortic aneurysm, without rupture, unspecified: Secondary | ICD-10-CM | POA: Diagnosis not present

## 2022-11-06 DIAGNOSIS — E875 Hyperkalemia: Secondary | ICD-10-CM | POA: Diagnosis not present

## 2022-11-06 DIAGNOSIS — K5900 Constipation, unspecified: Secondary | ICD-10-CM | POA: Diagnosis not present

## 2022-11-06 LAB — CBC WITH DIFFERENTIAL/PLATELET
Abs Immature Granulocytes: 0.12 10*3/uL — ABNORMAL HIGH (ref 0.00–0.07)
Basophils Absolute: 0 10*3/uL (ref 0.0–0.1)
Basophils Relative: 0 %
Eosinophils Absolute: 0 10*3/uL (ref 0.0–0.5)
Eosinophils Relative: 0 %
HCT: 32.5 % — ABNORMAL LOW (ref 39.0–52.0)
Hemoglobin: 9.6 g/dL — ABNORMAL LOW (ref 13.0–17.0)
Immature Granulocytes: 1 %
Lymphocytes Relative: 4 %
Lymphs Abs: 0.6 10*3/uL — ABNORMAL LOW (ref 0.7–4.0)
MCH: 28.5 pg (ref 26.0–34.0)
MCHC: 29.5 g/dL — ABNORMAL LOW (ref 30.0–36.0)
MCV: 96.4 fL (ref 80.0–100.0)
Monocytes Absolute: 1.2 10*3/uL — ABNORMAL HIGH (ref 0.1–1.0)
Monocytes Relative: 9 %
Neutro Abs: 11.2 10*3/uL — ABNORMAL HIGH (ref 1.7–7.7)
Neutrophils Relative %: 86 %
Platelets: 193 10*3/uL (ref 150–400)
RBC: 3.37 MIL/uL — ABNORMAL LOW (ref 4.22–5.81)
RDW: 15.2 % (ref 11.5–15.5)
WBC: 13.1 10*3/uL — ABNORMAL HIGH (ref 4.0–10.5)
nRBC: 0.2 % (ref 0.0–0.2)

## 2022-11-06 LAB — RENAL FUNCTION PANEL
Albumin: 2.7 g/dL — ABNORMAL LOW (ref 3.5–5.0)
Anion gap: 14 (ref 5–15)
BUN: 38 mg/dL — ABNORMAL HIGH (ref 6–20)
CO2: 24 mmol/L (ref 22–32)
Calcium: 7.1 mg/dL — ABNORMAL LOW (ref 8.9–10.3)
Chloride: 99 mmol/L (ref 98–111)
Creatinine, Ser: 6.37 mg/dL — ABNORMAL HIGH (ref 0.61–1.24)
GFR, Estimated: 10 mL/min — ABNORMAL LOW (ref >60)
Glucose, Bld: 96 mg/dL (ref 70–99)
Phosphorus: 3.3 mg/dL (ref 2.5–4.6)
Potassium: 4 mmol/L (ref 3.5–5.1)
Sodium: 137 mmol/L (ref 135–145)

## 2022-11-06 MED ORDER — STERILE WATER FOR INJECTION IJ SOLN
5.0000 mg | Freq: Once | RESPIRATORY_TRACT | Status: AC
  Administered 2022-11-06: 5 mg via INTRAPLEURAL
  Filled 2022-11-06: qty 5

## 2022-11-06 MED ORDER — CAMPHOR-MENTHOL 0.5-0.5 % EX LOTN: 1.0000 | TOPICAL_LOTION | Freq: Three times a day (TID) | CUTANEOUS | Status: DC | PRN

## 2022-11-06 MED ORDER — DOCUSATE SODIUM 283 MG RE ENEM: 1.0000 | ENEMA | RECTAL | Status: DC | PRN

## 2022-11-06 MED ORDER — HYDROXYZINE HCL 25 MG PO TABS: 25.0000 mg | ORAL_TABLET | Freq: Three times a day (TID) | ORAL | Status: DC | PRN

## 2022-11-06 MED ORDER — SODIUM CHLORIDE (PF) 0.9 % IJ SOLN
10.0000 mg | Freq: Once | INTRAMUSCULAR | Status: AC
  Administered 2022-11-06: 10 mg via INTRAPLEURAL
  Filled 2022-11-06: qty 10

## 2022-11-06 MED ORDER — CALCIUM CARBONATE ANTACID 1250 MG/5ML PO SUSP: 500.0000 mg | Freq: Four times a day (QID) | ORAL | Status: DC | PRN

## 2022-11-06 MED ORDER — ZOLPIDEM TARTRATE 5 MG PO TABS
5.0000 mg | ORAL_TABLET | Freq: Every evening | ORAL | Status: DC | PRN
  Administered 2022-11-18 – 2022-11-28 (×10): 5 mg via ORAL
  Filled 2022-11-06 (×10): qty 1

## 2022-11-06 MED ORDER — SORBITOL 70 % SOLN
30.0000 mL | Status: DC | PRN
  Administered 2022-11-27: 30 mL via ORAL
  Filled 2022-11-06: qty 30

## 2022-11-06 NOTE — Progress Notes (Signed)
Chest Tube unclamped per MDs instructions.

## 2022-11-06 NOTE — Progress Notes (Signed)
NAME:  Corey [name], MRN:  [id number], DOB:  1970-08-02, LOS: 10 ADMISSION DATE:  10/27/2022, CONSULTATION DATE:  10/28/2022 REFERRING MD:  Roney Mans, CHIEF COMPLAINT:  pleural effusion.   History of Present Illness:  52 year old man who is on hemodialysis with a left pleural effusion. He has had general decline in the state of health over the last several months with loss of appetite. He had a relatively abrupt onset of left chest discomfort which she like into a sensation of trapped gas in the left side with a pleuritic component.  Mild cough and somewhat increased shortness of breath.  Some subjective fever and chills.  CT shows a moderate to large loculated left pleural effusion.  Pertinent  Medical History   Past Medical History:  Diagnosis Date   Arthritis    hands and shoulders   Blindness and low vision    "Stargardt disease"   Dissection of aorta (HCC) 2004   a. s/p extensive repeair in 2004 in [place] complicated by ESRD, lower extremity paralysis, coma, and extended hospitalization of 2 years   ESRD (end stage renal disease) (HCC)    a. TTS   Headache    History of cardioversion 2014   Hypertension    Neuropathy    Non-healing non-surgical wound 03/2016   PAF (paroxysmal atrial fibrillation) (HCC)    a. s/p DCCV in 2014; b. on Coumadin; c. CHADS2VASc => 2 (HTN, vascular disease)   Paralysis (HCC)    due to dissection of aorta in 2004, lower extremities   Pneumonia      Significant Hospital Events: Including procedures, antibiotic start and stop dates in addition to other pertinent events   7/25 - CT shows loculated left pleural effusion with associated consolidation.  No evidence of lymphadenopathy or mass. 7/28 No acute issues overnight, remains on 3L [place]. INR remains elevated at 2.5, Vitamin K given.   7/31 chest tube placed 8/3 ct chest shows tube is in the lung base with enlarging pleural effusion, first day of intrapleural fibrinolytics  Interim History /  Subjective:  Laying flat. Still on oxygen. Says breathing is no better. Refused dialysis session today.  Having a lot of pain  Objective   Blood pressure (!) 82/53, pulse 75, temperature 97.6 F (36.4 C), temperature source Oral, resp. rate 16, height 6\' 3"  (1.905 m), weight 66.4 kg, SpO2 (!) 83%.        Intake/Output Summary (Last 24 hours) at 11/06/2022 1444 Last data filed at 11/06/2022 0929 Gross per 24 hour  Intake 25 ml  Output 400 ml  Net -375 ml   Filed Weights   11/01/22 0744 11/01/22 1030 11/04/22 1518  Weight: 63.6 kg 63 kg 66.4 kg    Examination: Chronically ill appearing.  On nasal cannula Thin appears older than state age RRR, breath sounds diminished left lung base Extremities thin, no edema  Chest xray personally reviewed - persistent left sided effusion with chest tube in appropriate position.  I/O reviewed, about 400cc serosanguinous drainage from tube since lytics first administered.    Ancillary Tests personally reviewed   Lab Results  Component Value Date   NA 137 11/06/2022   K 4.0 11/06/2022   CO2 24 11/06/2022   GLUCOSE 96 11/06/2022   BUN 38 (H) 11/06/2022   CREATININE 6.37 (H) 11/06/2022   CALCIUM 7.1 (L) 11/06/2022   EGFR 6 (L) 08/31/2022   GFRNONAA 10 (L) 11/06/2022   Lab Results  Component Value Date   WBC 13.1 (H)  11/06/2022   HGB 9.6 (L) 11/06/2022   HCT 32.5 (L) 11/06/2022   MCV 96.4 11/06/2022   PLT 193 11/06/2022   CT Chest also reviewed.    Assessment & Plan:  Parapneumonic left pleural effusion - strong suspicion for empyema.  End-stage renal disease on hemodialysis Unexplained weight loss Calciphylaxis P: Day 2 intrapleural fibrinolytic therapy. If he has a good response would do a third day and then consider CT Chest.   Pulmonary will continue to follow.   Durel Salts, MD Pulmonary and Critical Care Medicine [facility] 11/06/2022 2:44 PM Pager: see AMION  If no response to pager, please call critical  care on call (see AMION) until 7pm After 7:00 pm call Elink

## 2022-11-06 NOTE — Procedures (Signed)
Pleural Fibrinolytic Administration Procedure Note  Corey [name]  [id number]  1970-07-19  Date:11/06/22  Time:3:08 PM   Provider Performing: [name]   Procedure: Pleural Fibrinolysis Subsequent day [phone])  Indication(s) Fibrinolysis of complicated pleural effusion  Consent Risks of the procedure as well as the alternatives and risks of each were explained to the patient and/or caregiver.  Consent for the procedure was obtained.   Anesthesia None   Time Out Verified patient identification, verified procedure, site/side was marked, verified correct patient position, special equipment/implants available, medications/allergies/relevant history reviewed, required imaging and test results available.   Sterile Technique Hand hygiene, gloves   Procedure Description Existing pleural catheter was cleaned and accessed in sterile manner.  10mg  of tPA in 30cc of saline and 5mg  of dornase in 30cc of sterile water were injected into pleural space using existing pleural catheter.  Catheter will be clamped for 1 hour and then placed back to suction.   Complications/Tolerance None; patient tolerated the procedure well.  EBL None   Specimen(s) None

## 2022-11-06 NOTE — Plan of Care (Signed)
  Problem: Clinical Measurements: Goal: Ability to maintain clinical measurements within normal limits will improve Outcome: Progressing Goal: Will remain free from infection Outcome: Progressing Goal: Diagnostic test results will improve Outcome: Progressing Goal: Respiratory complications will improve Outcome: Progressing Goal: Cardiovascular complication will be avoided Outcome: Progressing   Problem: Health Behavior/Discharge Planning: Goal: Ability to manage health-related needs will improve Outcome: Progressing   Problem: Education: Goal: Knowledge of General Education information will improve Description: Including pain rating scale, medication(s)/side effects and non-pharmacologic comfort measures Outcome: Progressing   Problem: Activity: Goal: Risk for activity intolerance will decrease Outcome: Progressing   Problem: Nutrition: Goal: Adequate nutrition will be maintained Outcome: Progressing   Problem: Coping: Goal: Level of anxiety will decrease Outcome: Progressing   Problem: Elimination: Goal: Will not experience complications related to bowel motility Outcome: Progressing Goal: Will not experience complications related to urinary retention Outcome: Progressing   Problem: Pain Managment: Goal: General experience of comfort will improve Outcome: Progressing   Problem: Safety: Goal: Ability to remain free from injury will improve Outcome: Progressing   Problem: Skin Integrity: Goal: Risk for impaired skin integrity will decrease Outcome: Progressing

## 2022-11-06 NOTE — Plan of Care (Signed)

## 2022-11-06 NOTE — Progress Notes (Signed)
[facility] Progress Note   Subjective:  Seen in room.  C/o pleuritic CP related to chest tube which had tPA instilled yesterday after CT showed residual loculated effusion.  Very uncomfortable. The CT also showed worsening pulm edema though his O2 requirement was stable -- I had recommended and extra session of UF with dialysis which he initially agreed to but later declined.  PO intake remains poor  Objective Vitals:   11/05/22 2100 11/05/22 2302 11/06/22 0331 11/06/22 0727  BP: (!) 91/51 (!) 88/52 99/63 108/63  Pulse: 66 64 66 71  Resp:  16 16   Temp:  98.4 F (36.9 C) 98.3 F (36.8 C) 97.6 F (36.4 C)  TempSrc:  Oral Oral Oral  SpO2: 95% 99% 99% 91%  Weight:      Height:       Physical Exam General: Chronically ill appearing man, NAD. Nasal O2 in place. Awake and alert - appearing uncomfortable.  Heart: RRR; no murmur Lungs: L chest tube in place, clear anteriorly Abdomen: soft Extremities: chronic woody changes without pitting edema Dialysis Access: [facility]  c/d/i  Additional Objective Labs: Basic Metabolic Panel: Recent Labs  Lab 11/04/22 0122 11/05/22 0058 11/06/22 0138  NA 136 136 137  K 4.0 3.7 4.0  CL 98 97* 99  CO2 25 26 24   GLUCOSE 110* 112* 96  BUN 45* 26* 38*  CREATININE 7.84* 5.09* 6.37*  CALCIUM 6.3* 6.8* 7.1*  PHOS 3.2 2.5 3.3   Liver Function Tests: Recent Labs  Lab 10/31/22 0056 11/01/22 0139 11/02/22 0905 11/02/22 1743 11/03/22 0316 11/04/22 0122 11/05/22 0058 11/06/22 0138  AST 6* 6* 7*  --   --   --   --   --   ALT 8 7 5   --   --   --   --   --   ALKPHOS 205* 209* 211*  --   --   --   --   --   BILITOT 0.7 0.4 0.4  --   --   --   --   --   PROT 6.1* 6.2* 6.0* 6.4*  --   --   --   --   ALBUMIN 2.4* 2.4* 2.4*  --    < > 2.7* 2.8* 2.7*   < > = values in this interval not displayed.   No results for input(s): "LIPASE", "AMYLASE" in the last 168 hours.  CBC: Recent Labs  Lab 11/02/22 1743 11/03/22 0316 11/04/22 0122  11/05/22 0058 11/06/22 0138  WBC 12.2* 12.0* 13.3* 14.3* 13.1*  NEUTROABS  --  10.4* 11.3* 12.4* 11.2*  HGB 9.9* 9.8* 9.6* 9.6* 9.6*  HCT 32.9* 32.8* 31.9* 31.5* 32.5*  MCV 96.8 97.3 97.6 96.3 96.4  PLT 192 217 204 192 193   Medications:  albumin human     anticoagulant sodium citrate     azithromycin 500 mg (11/05/22 0827)   calcium gluconate     cefTRIAXone (ROCEPHIN)  IV 2 g (11/05/22 1052)    (feeding supplement) PROSource Plus  30 mL Oral BID BM   amiodarone  200 mg Oral Daily   bisacodyl  10 mg Rectal Once   Chlorhexidine Gluconate Cloth  6 each Topical Daily   Chlorhexidine Gluconate Cloth  6 each Topical Q0600   darbepoetin (ARANESP) injection - DIALYSIS  40 mcg Subcutaneous Q Sat-1800   hydrocerin   Topical Daily   lidocaine  3 patch Transdermal Q24H   midodrine  15 mg Oral TID WC  milk and molasses  1 enema Rectal Once   polyethylene glycol  17 g Oral BID   senna-docusate  1 tablet Oral BID   sevelamer carbonate  4.8 g Oral TID WC   sodium chloride flush  10 mL Intrapleural Q8H   sodium chloride flush  10 mL Intrapleural Q8H    Dialysis Orders: TTS [facility] 4hr, 2K/2Ca, UFP #4, TDC, EDW 66kg, no heparin (allergy) - locks TDC with CaCitrate - No ESA or VDRA - Binder: Renvela pwdr 4.8g TID - Sensipar 180mg  every day at home   Assessment/Plan: Acute hypoxic respiratory failure: In setting of missed HD. CXR with pleural effusion s/p chest tube 7/31. LE now resolved. Loculated L pleural effusion: s/p thoracentesis with cultures.  Afebrile.  CT remains, had tPA infused 8/3. Per pulm. ESRD: usual TTS schedule - declined HD Thurs but did have Friday- UF has been limited by hypotension.  Recommended UF session 8/3 but he refused, will put on schedule for HD tomorrow.   BP/volume: BP low, edema resolved and now well below prior EDW.  8/3 CT chest with ^ pulm edema though O2 requirement stable - declined UF session 8/3.   UF with HD on 8/5 - on midodrine and have been using  albumin support with HD.   Anemia of ESRD:  Hgb 9s - s/p Aranesp on 7/27.  Hb stable this AM.  Secondary HPTH: Ca/Phos ok - continue home binders.  Held cinacalcet due to hypoCa - consider resuming at lower dose (was 180 daily) if Ca improves. Nutrition: Alb low, continue supplements. A-fib: On amiodarone + warfarin (on hold for chest tube), has IVC filter Hx type B aortic dissection s/p repair (2004) Hypotension:  baseline low BP now much lower s/p chest tube placement -- afebrile.  Suspect pain meds playing role.  Gave albumin for volume support + midodrine - tolerating UF ok.     Estill Bakes MD [facility] Pager [phone]

## 2022-11-06 NOTE — Progress Notes (Signed)
PROGRESS NOTE    Corey [name]  QIO:[id number] DOB: 16-Jan-1971 DOA: 10/27/2022 PCP: [name], NP   Chief Complaint  Patient presents with   Flank Pain   Shoulder Pain    Brief Narrative: Corey [name] is a 52 y.o. male with medical history significant of end-stage renal disease on hemodialysis TTS, paroxysmal atrial fibrillation on chronic Coumadin therapy, history of previous aortic dissection, peripheral vascular disease, history of osteoarthritis, history of neuropathy, who missed hemodialysis twice secondary to nausea vomiting and diarrhea.  Patient came to the ER with shortness of breath. Was found to be hypoxic with new oxygen requirement.  Also significant hyperkalemia.  Nephrology was consulted and patient is being dialyzed.  As part of his workup patient was found to have complex right-sided pleural effusion that looks loculated.  There is worried that this represents possible infection versus other loculated pleural effusion.  Patient is therefore being admitted to the hospital.  Patient currently being dialyzed as indicated.    Currently awaiting improvement in INR for chest tube placement.    Vascular was consulted for PVD.     Assessment & Plan:   Principal Problem:   Pleural effusion Active Problems:   ESRD on dialysis [facility])   Essential hypertension   Chronic diastolic CHF (congestive heart failure) (HCC)   Atrial fibrillation, chronic (HCC)   End-stage renal disease on hemodialysis (HCC)   AAA (abdominal aortic aneurysm) without rupture (HCC)   Hyperkalemia   PVD (peripheral vascular disease) (HCC)   Constipation   Lobar pneumonia (HCC)   Parapneumonic effusion  #1 acute hypoxic respiratory failure secondary to large loculated left pleural effusion/parapneumonic effusion. -Patient noted to have presented with shortness of breath noted to be hypoxic with new oxygen requirement. -Patient seen in consultation by nephrology underwent hemodialysis with no  significant improvement with shortness of breath. -Patient noted to have a complex left-sided pleural effusion which was concerning for loculation. -PCCM consulted and following and was awaiting for improvement with INR prior to chest tube placement and further evaluation. -Patient subsequently underwent chest tube placement per PCCM on 11/02/2022.  -Pleural fluid analysis according to lights criteria consistent with a exudative parapneumonic effusion.  -Pleural fluid cultures and Gram stain pending.  -Patient had repeat CT chest done and per pulmonary pigtail was called between atelectatic lung and diaphragm and pleural effusion noted to be persistent to larger.   -Pigtail catheter was suggested.  Pulmonary and patient given dornase/tPA due to loculations per pulmonary on 11/05/2022.  -Pulmonary recommending 10-day course of Rocephin and 5-day course of azithromycin. -PCCM following and appreciate input and recommendations.  2.  Left-sided pneumonia -Noted on chest x-ray.  Patient also noted to have a loculated left pleural effusion. -Patient underwent chest tube placement, pleural fluid analysis consistent with an exudative parapneumonic effusion. -Sputum Gram stain and culture pending.  -Blood cultures pending with no growth to date x 3 days..  -Continue IV Rocephin, IV azithromycin. -PCCM/pulmonary following.  3.  ESRD TTS -On HD. -Cinacalcet on hold due to concerns for hypoxia per nephrology. -Patient was to have HD on 11/03/2022, however due to hypotension and adjustment in midodrine patient did not have hemodialysis..  Patient was to have hemodialysis the evening of 11/03/2022, but declined at that time due to blood pressure being low and concerned that he might have a syncopal episode.   -IV albumin x 2 during the hospitalization.  -Patient underwent HD 11/04/2022.  -CT chest consistent with volume overload..  -Hemodialysis offered  yesterday however patient refused.   -Next hemodialysis  session 11/07/2022 per nephrology.  -Per nephrology.  4.  Hypotension -Status post IV albumin x 2 and midodrine dose changed to 15 mg 3 times daily on 11/04/2022 per nephrology.   -Patient with improving blood pressure with systolics in the 80s-90s this morning..    5.  PVD/bilateral lower extremities with chronic skin changes related to vascular disease -Patient seen in consultation by vascular surgery who recommended Eucerin daily, outpatient follow-up with vascular surgery.  6.  Constipation -Patient still with complaints of constipation.   -Patient noted to have refused MiraLAX and Senokot S on 11/04/2022 however states he took laxatives on 11/05/2022. -Sorbitol every 3 hours x 2 was ordered. -Dulcolax also ordered however patient noted to be refusing laxatives. -Patient states took MiraLAX today. -Milk of molasses enema x 1. -If no BM will need to try smog enema..     7.  Hyperkalemia -Resolved. -On HD.  8.  Paroxysmal atrial fibrillation -Continue amiodarone for rate control. -Coumadin held for chest tube placement/thoracentesis which was done 11/02/2022.  -Pulmonary to advise when anticoagulation may be resumed.  9.  Chronic diastolic CHF -Volume being managed via hemodialysis. -Patient noted to have had hemodialysis, 11/04/2022.   -Patient with crackles noted on examination however patient refused hemodialysis yesterday.   -CT chest done was consistent with some pulmonary edema.  -2D echo, 2020 with a EF of 60 to 65%, increased LV wall thickness. -Per nephrology.  10.  Hypertension>>> hypotension -BP noted to have soft blood pressures over the past few days.  -Patient not taking metoprolol at home. -IV albumin ordered per nephrology and patient midodrine changed to 15 mg 3 times daily per nephrology on 11/04/2022.   -BP improving with systolics in the 90s this morning. -Pain medication also likely leading to soft/hypotensive blood pressures.  11.  Hypocalcemia -Corrected  calcium of 8.14.   -Calcium gluconate 2 g IV x 1 ordered yesterday but not given..  -Repeat labs in the AM.     DVT prophylaxis: INR noted at 1.5 on 11/03/2022  Code Status: Full Family Communication: Updated patient.  No family at bedside. Disposition: TBD  Status is: Inpatient Remains inpatient appropriate because: Severity of illness   Consultants:  Nephrology: Dr. Arlean Hopping 10/27/2022 PCCM: Dr. Denese Killings 10/28/2022 Vascular surgery: Dr. Edilia Bo 10/30/2022  Procedures:  CT angiogram chest abdomen and pelvis 11/02/2023 CT chest abdomen and pelvis 10/27/2019 Chest x-ray 10/27/2022 Vascular ultrasound with ABIs 11/01/2022 30 insertion with image guidance/chest tube placement: Per PCCM, Dr. Denese Killings 11/02/2022 CT chest 11/05/2022 Pleural fibrinolysis per PCCM: Dr. Celine Mans 11/05/2022  Antimicrobials:  Anti-infectives (From admission, onward)    Start     Dose/Rate Route Frequency Ordered Stop   11/03/22 0915  cefTRIAXone (ROCEPHIN) 2 g in sodium chloride 0.9 % 100 mL IVPB        2 g 200 mL/hr over 30 Minutes Intravenous Every 24 hours 11/03/22 0823 11/08/22 0914   11/03/22 0915  azithromycin (ZITHROMAX) 500 mg in sodium chloride 0.9 % 250 mL IVPB        500 mg 250 mL/hr over 60 Minutes Intravenous Every 24 hours 11/03/22 0823 11/08/22 0914         Subjective: Patient laying in bed.  Complains of feeling short of breath which is worsening.  Still with complaints of left-sided upper abdominal pain.  States did not have a significant bowel movement.  Noted to have refused hemodialysis yesterday as well as some of the laxatives.  States he has taken the MiraLAX this morning.    Objective: Vitals:   11/05/22 2100 11/05/22 2302 11/06/22 0331 11/06/22 0727  BP: (!) 91/51 (!) 88/52 99/63 108/63  Pulse: 66 64 66 71  Resp:  16 16   Temp:  98.4 F (36.9 C) 98.3 F (36.8 C) 97.6 F (36.4 C)  TempSrc:  Oral Oral Oral  SpO2: 95% 99% 99% 91%  Weight:      Height:        Intake/Output  Summary (Last 24 hours) at 11/06/2022 1125 Last data filed at 11/06/2022 0929 Gross per 24 hour  Intake 25 ml  Output 400 ml  Net -375 ml   Filed Weights   11/01/22 0744 11/01/22 1030 11/04/22 1518  Weight: 63.6 kg 63 kg 66.4 kg    Examination:  General exam: NAD. Respiratory system: Decreased breath sounds in the left base.  Diffuse crackles.  No wheezing.  Left-sided chest tube in place.   Cardiovascular system: Regular rate rhythm no murmurs rubs or gallops.  No JVD. Chronic woody changes of lower extremities without pitting edema.     Gastrointestinal system: Abdomen is soft, nondistended, decreased tenderness to palpation left upper quadrant.  Positive bowel sounds.  No rebound.  No guarding.  Central nervous system: Alert and oriented. No focal neurological deficits. Extremities: Chronic woody changes of lower extremities without pitting edema.    Symmetric 5 x 5 power. Skin: No rashes, lesions or ulcers Psychiatry: Judgement and insight appear normal. Mood & affect appropriate.     Data Reviewed: I have personally reviewed following labs and imaging studies  CBC: Recent Labs  Lab 11/02/22 0905 11/02/22 1743 11/03/22 0316 11/04/22 0122 11/05/22 0058 11/06/22 0138  WBC 12.6* 12.2* 12.0* 13.3* 14.3* 13.1*  NEUTROABS 11.1*  --  10.4* 11.3* 12.4* 11.2*  HGB 9.3* 9.9* 9.8* 9.6* 9.6* 9.6*  HCT 31.3* 32.9* 32.8* 31.9* 31.5* 32.5*  MCV 97.2 96.8 97.3 97.6 96.3 96.4  PLT 184 192 217 204 192 193    Basic Metabolic Panel: Recent Labs  Lab 10/31/22 0056 11/01/22 0139 11/02/22 0905 11/03/22 0316 11/04/22 0122 11/05/22 0058 11/06/22 0138  NA 135 135 136 136 136 136 137  K 4.2 4.8 3.9 4.0 4.0 3.7 4.0  CL 95* 96* 95* 97* 98 97* 99  CO2 26 23 25 25 25 26 24   GLUCOSE 92 95 125* 96 110* 112* 96  BUN 43* 52* 35* 38* 45* 26* 38*  CREATININE 7.08* 8.47* 6.27* 7.04* 7.84* 5.09* 6.37*  CALCIUM 7.0* 6.3* 6.3* 6.2* 6.3* 6.8* 7.1*  MG 1.9 1.9  --   --   --   --   --   PHOS 3.6  3.7  --  3.1 3.2 2.5 3.3    GFR: Estimated Creatinine Clearance: 12.7 mL/min (A) (by C-G formula based on SCr of 6.37 mg/dL (H)).  Liver Function Tests: Recent Labs  Lab 10/31/22 0056 11/01/22 0139 11/02/22 0905 11/02/22 1743 11/03/22 0316 11/04/22 0122 11/05/22 0058 11/06/22 0138  AST 6* 6* 7*  --   --   --   --   --   ALT 8 7 5   --   --   --   --   --   ALKPHOS 205* 209* 211*  --   --   --   --   --   BILITOT 0.7 0.4 0.4  --   --   --   --   --   PROT 6.1* 6.2* 6.0*  6.4*  --   --   --   --   ALBUMIN 2.4* 2.4* 2.4*  --  2.7* 2.7* 2.8* 2.7*    CBG: No results for input(s): "GLUCAP" in the last 168 hours.    Recent Results (from the past 240 hour(s))  MRSA Next Gen by PCR, Nasal     Status: None   Collection Time: 10/28/22 12:03 AM   Specimen: Nasal Mucosa; Nasal Swab  Result Value Ref Range Status   MRSA by PCR Next Gen NOT DETECTED NOT DETECTED Final    Comment: (NOTE) The GeneXpert MRSA Assay (FDA approved for NASAL specimens only), is one component of a comprehensive MRSA colonization surveillance program. It is not intended to diagnose MRSA infection nor to guide or monitor treatment for MRSA infections. Test performance is not FDA approved in patients less than 62 years old. Performed at [facility], [address] [place], [place] [address]   Body fluid culture w Gram Stain     Status: None   Collection Time: 11/02/22  4:44 PM   Specimen: Pleural Fluid  Result Value Ref Range Status   Specimen Description FLUID PLEURAL  Final   Special Requests NONE  Final   Gram Stain   Final    RARE WBC PRESENT, PREDOMINANTLY PMN NO ORGANISMS SEEN    Culture   Final    NO GROWTH 3 DAYS Performed at [facility], [address] [place], [place] [address]    Report Status 11/06/2022 FINAL  Final  Acid Fast Smear (AFB)     Status: None   Collection Time: 11/02/22  4:44 PM   Specimen: Pleural, Left; Pleural Fluid  Result Value Ref Range Status   AFB  Specimen Processing Concentration  Final   Acid Fast Smear Negative  Final    Comment: (NOTE) Performed At: [facility] [address] [place], [place] [id number] Jolene Schimke MD KG:[id number]    Source (AFB) FLUID  Final    Comment: PLEURAL Performed at [facility], [address], [place], [place] [address]   Culture, blood (routine x 2) Call MD if unable to obtain prior to antibiotics being given     Status: None (Preliminary result)   Collection Time: 11/03/22  8:49 AM   Specimen: BLOOD LEFT ARM  Result Value Ref Range Status   Specimen Description BLOOD LEFT ARM  Final   Special Requests   Final    BOTTLES DRAWN AEROBIC AND ANAEROBIC Blood Culture adequate volume   Culture   Final    NO GROWTH 3 DAYS Performed at [facility], [address] [place], [place] [address]    Report Status PENDING  Incomplete  Culture, blood (routine x 2) Call MD if unable to obtain prior to antibiotics being given     Status: None (Preliminary result)   Collection Time: 11/03/22  8:55 AM   Specimen: BLOOD LEFT ARM  Result Value Ref Range Status   Specimen Description BLOOD LEFT ARM  Final   Special Requests   Final    BOTTLES DRAWN AEROBIC AND ANAEROBIC Blood Culture adequate volume   Culture   Final    NO GROWTH 3 DAYS Performed at [facility], [address], [place], [place] [address]    Report Status PENDING  Incomplete         Radiology Studies: DG Chest Port 1 View  Result Date: 11/06/2022 CLINICAL DATA:  Follow-up chest tube. EXAM:  PORTABLE CHEST 1 VIEW COMPARISON:  CT 11/05/2022 FINDINGS: Stable position of left basilar chest tube. No pneumothorax. Right chest wall dialysis catheter noted with tips in the right atrium. The cardiomediastinal contours are unremarkable. Diffuse increased reticular interstitial opacities are noted throughout both lungs compatible with interstitial edema. The left pleural effusion is unchanged in volume compared with  11/02/2022. Similar appearance of bilateral lower lobe atelectasis and airspace disease. IMPRESSION: 1. Stable position of left basilar chest tube. No pneumothorax. 2. Unchanged left pleural effusion and bilateral lower lobe atelectasis and airspace disease. 3. Diffuse interstitial edema. Electronically Signed   By: Signa Kell M.D.   On: 11/06/2022 09:28   CT CHEST WO CONTRAST  Result Date: 11/05/2022 CLINICAL DATA:  Pneumonia. EXAM: CT CHEST WITHOUT CONTRAST TECHNIQUE: Multidetector CT imaging of the chest was performed following the standard protocol without IV contrast. RADIATION DOSE REDUCTION: This exam was performed according to the departmental dose-optimization program which includes automated exposure control, adjustment of the mA and/or kV according to patient size and/or use of iterative reconstruction technique. COMPARISON:  Chest x-ray dated November 02, 2022. CT chest dated October 27, 2022. FINDINGS: Cardiovascular: Unchanged cardiomegaly. No significant pericardial effusion. Grossly unchanged type B thoracic aortic dissection with aneurysmal dilatation of the proximal descending thoracic aorta up to 4.5 cm in diameter. No ascending thoracic aortic aneurysm. Coronary, aortic arch, and branch vessel atherosclerotic vascular disease. Unchanged tunneled right internal jugular dialysis catheter with tip in the right atrium. Mediastinum/Nodes: No enlarged mediastinal or axillary lymph nodes. Thyroid gland, trachea, and esophagus demonstrate no significant findings. Lungs/Pleura: New left-sided chest tube centrally positioned at the left lung base. Moderate loculated left pleural effusion has mildly increased in size. Unchanged near complete collapse of the left lower lobe. Worsening smooth interlobular septal thickening, particularly in the right lung, with new small ill-defined ground-glass densities. New trace amount of loculated pleural fluid along the right minor fissure. Increasing atelectasis at the  right lung base. Similar atelectasis in the lingula. No consolidation or pneumothorax. Upper Abdomen: New small perihepatic ascites. Musculoskeletal: No acute or significant osseous findings. Sequelae of renal osteodystrophy again noted. IMPRESSION: 1. New left-sided chest tube centrally positioned at the left lung base. Moderate loculated left pleural effusion has mildly increased in size. Correlate with tube function. 2. Unchanged near complete collapse of the left lower lobe. Increasing atelectasis at the right lung base. 3. Worsening pulmonary edema.  New small perihepatic ascites. 4. Grossly unchanged type B thoracic aortic dissection with aneurysmal dilatation of the proximal descending thoracic aorta up to 4.5 cm in diameter. 5.  Aortic atherosclerosis (ICD10-I70.0). Electronically Signed   By: Obie Dredge M.D.   On: 11/05/2022 11:17        Scheduled Meds:  (feeding supplement) PROSource Plus  30 mL Oral BID BM   amiodarone  200 mg Oral Daily   bisacodyl  10 mg Rectal Once   Chlorhexidine Gluconate Cloth  6 each Topical Daily   Chlorhexidine Gluconate Cloth  6 each Topical Q0600   darbepoetin (ARANESP) injection - DIALYSIS  40 mcg Subcutaneous Q Sat-1800   hydrocerin   Topical Daily   lidocaine  3 patch Transdermal Q24H   midodrine  15 mg Oral TID WC   milk and molasses  1 enema Rectal Once   polyethylene glycol  17 g Oral BID   senna-docusate  1 tablet Oral BID   sevelamer carbonate  4.8 g Oral TID WC   sodium chloride flush  10 mL Intrapleural Q8H  sodium chloride flush  10 mL Intrapleural Q8H   Continuous Infusions:  albumin human     anticoagulant sodium citrate     azithromycin 500 mg (11/06/22 1044)   calcium gluconate     cefTRIAXone (ROCEPHIN)  IV 2 g (11/06/22 0955)     LOS: 10 days    Time spent: 40 minutes    Ramiro Harvest, MD [facility]   To contact the attending provider between 7A-7P or the covering provider during after hours 7P-7A,  please log into the web site [email or web address] and access using universal [place] password for that web site. If you do not have the password, please call the hospital operator.  11/06/2022, 11:25 AM

## 2022-11-07 ENCOUNTER — Inpatient Hospital Stay (HOSPITAL_COMMUNITY): Payer: 59

## 2022-11-07 DIAGNOSIS — I714 Abdominal aortic aneurysm, without rupture, unspecified: Secondary | ICD-10-CM | POA: Diagnosis not present

## 2022-11-07 DIAGNOSIS — Z4682 Encounter for fitting and adjustment of non-vascular catheter: Secondary | ICD-10-CM

## 2022-11-07 DIAGNOSIS — K5900 Constipation, unspecified: Secondary | ICD-10-CM | POA: Diagnosis not present

## 2022-11-07 DIAGNOSIS — E875 Hyperkalemia: Secondary | ICD-10-CM | POA: Diagnosis not present

## 2022-11-07 DIAGNOSIS — J90 Pleural effusion, not elsewhere classified: Secondary | ICD-10-CM | POA: Diagnosis not present

## 2022-11-07 DIAGNOSIS — J918 Pleural effusion in other conditions classified elsewhere: Secondary | ICD-10-CM | POA: Diagnosis not present

## 2022-11-07 DIAGNOSIS — J189 Pneumonia, unspecified organism: Secondary | ICD-10-CM | POA: Diagnosis not present

## 2022-11-07 MED ORDER — FENTANYL CITRATE PF 50 MCG/ML IJ SOSY
50.0000 ug | PREFILLED_SYRINGE | Freq: Once | INTRAMUSCULAR | Status: AC
  Administered 2022-11-07: 50 ug via INTRAVENOUS
  Filled 2022-11-07: qty 1

## 2022-11-07 MED ORDER — STERILE WATER FOR INJECTION IJ SOLN
5.0000 mg | Freq: Once | RESPIRATORY_TRACT | Status: AC
  Administered 2022-11-07: 5 mg via INTRAPLEURAL
  Filled 2022-11-07: qty 5

## 2022-11-07 MED ORDER — SODIUM CHLORIDE (PF) 0.9 % IJ SOLN
10.0000 mg | Freq: Once | INTRAMUSCULAR | Status: AC
  Administered 2022-11-07: 10 mg via INTRAPLEURAL
  Filled 2022-11-07: qty 10

## 2022-11-07 NOTE — Progress Notes (Addendum)
NAME:  KAZUTO MARINEZ, MRN:  [id number], DOB:  [birth year], LOS: 11 ADMISSION DATE:  10/27/2022, CONSULTATION DATE:  10/28/2022 REFERRING MD:  Roney Mans, CHIEF COMPLAINT:  pleural effusion.   History of Present Illness:  52 year old man who is on hemodialysis with a left pleural effusion. He has had general decline in the state of health over the last several months with loss of appetite. He had a relatively abrupt onset of left chest discomfort which she like into a sensation of trapped gas in the left side with a pleuritic component.  Mild cough and somewhat increased shortness of breath.  Some subjective fever and chills.  CT shows a moderate to large loculated left pleural effusion.  Pertinent  Medical History   Past Medical History:  Diagnosis Date   Arthritis    hands and shoulders   Blindness and low vision    "Stargardt disease"   Dissection of aorta (HCC) 2004   a. s/p extensive repeair in 2004 in [place] complicated by ESRD, lower extremity paralysis, coma, and extended hospitalization of 2 years   ESRD (end stage renal disease) (HCC)    a. TTS   Headache    History of cardioversion 2014   Hypertension    Neuropathy    Non-healing non-surgical wound 03/2016   PAF (paroxysmal atrial fibrillation) (HCC)    a. s/p DCCV in 2014; b. on Coumadin; c. CHADS2VASc => 2 (HTN, vascular disease)   Paralysis (HCC)    due to dissection of aorta in 2004, lower extremities   Pneumonia      Significant Hospital Events: Including procedures, antibiotic start and stop dates in addition to other pertinent events   7/25 - CT shows loculated left pleural effusion with associated consolidation.  No evidence of lymphadenopathy or mass. 7/28 No acute issues overnight, remains on 3L [place]. INR remains elevated at 2.5, Vitamin K given.   7/31 chest tube placed 8/3 ct chest shows tube is in the lung base with enlarging pleural effusion, first day of intrapleural fibrinolytics 8/4 second dose of  intrapleural lytics  Interim History / Subjective:  Had some pain with intrapleural lytics yesterday, Feels very tired after HD today.    Objective   Blood pressure 100/62, pulse 65, temperature 97.7 F (36.5 C), temperature source Oral, resp. rate 20, height 6\' 3"  (1.905 m), weight 65.9 kg, SpO2 100%.        Intake/Output Summary (Last 24 hours) at 11/07/2022 1136 Last data filed at 11/07/2022 0559 Gross per 24 hour  Intake 626.8 ml  Output 740 ml  Net -113.2 ml   Filed Weights   11/01/22 1030 11/04/22 1518 11/07/22 0759  Weight: 63 kg 66.4 kg 65.9 kg    Examination: Chronically ill appearing man lying in bed in NAD [place]/AT, eyes anicteric Breathing comfortably on [place]. Chest tube with serosanguinous drainage, some chunks in fluid. Chest tube tidaling.  Awake, alert, globally weak. Normal speech, answer questions appropriately Skin warm, dry  CXR personally reviewed> RLL lateral scar persists. Improving left dependent effusion. LLL opacity. Chest tube remains in L dependent hemithorax. RIJ dialysis catheter.  BUN 45 Cr 7.36 WBC 11.5 H/H 10.1/33.8 Platelets 233 Blood cultures NGTD Pleural fluid: NG Pleural fluid AFB negative    Assessment & Plan:  Parapneumonic complicated left pleural effusion; not empyema- no pus, negative cultures/ gram stain. End-stage renal disease on hemodialysis Unexplained weight loss Calciphylaxis Aortic dissection, type B, chronic & stable on imaging. P: -Con't chest tube to suction -Recommend third  dose of intrapleural lytics due to good response yesterday. Although the fluid is serosanguinous, it is not frank blood and his H/H is stable. He has normal platelets. Risk of not giving lytics outweighs potential benefit at this point -fentanyl one time when chest tube meds go in today; have discussed with RN that we will coordinate giving these concurrently. -if he has ongoing pain after chest tube is drained, I suspect he has trapped lung -con't  ceftriaxone; will need to remain on this until the effusion is resolved to sterilize the pleural space, which may require > 7 days    Steffanie Dunn, DO 11/07/22 11:38 AM [facility]  For contact information, see Amion. If no response to pager, please call PCCM consult pager. After hours, 7PM- 7AM, please call [place].

## 2022-11-07 NOTE — Progress Notes (Signed)
Back from hemodialysis by bed awake and alert. 

## 2022-11-07 NOTE — Progress Notes (Signed)
Transported to HD by bed awake and alert. °

## 2022-11-07 NOTE — Progress Notes (Addendum)
[facility] Progress Note   Subjective:  Seen in HD unit. Says he is in pain. 2.5 L off w/ HD this am.   Objective Vitals:   11/07/22 1100 11/07/22 1130 11/07/22 1206 11/07/22 1215  BP: 100/62 103/62 112/73 112/73  Pulse: 65 (!) 51 65 60  Resp: 20 19 18  (!) 24  Temp:    97.8 F (36.6 C)  TempSrc:      SpO2: 100% (!) 84% 93% 100%  Weight:    63.4 kg  Height:       Physical Exam General: Chronically ill appearing man, NAD. Nasal O2 in place. Awake and alert Heart: RRR; no murmur Lungs: L chest tube in place, clear anteriorly Abdomen: soft Extremities: chronic woody changes without pitting edema +cachectic Dialysis Access: TDC  c/d/i  OP HD: TTS [facility] 4h  2/2 bath  66kg   TDC   no heparin (allergy)  lock TDC w/ citrate - No ESA or VDRA - Binder: Renvela pwdr 4.8g TID - Sensipar 180mg  every day at home   Assessment/Plan: Acute hypoxic respiratory failure: In setting of missed HD. CXR with pleural effusion s/p chest tube 7/31. LE now resolved. Loculated L pleural effusion: s/p thoracentesis with cultures.  Afebrile.  Chest tube remains, had TPA infused 8/3. Per pulm. ESRD: usual TTS schedule - declined HD Thurs but did have Friday- UF has been limited by hypotension. Only had HD x 2 last week here. Got 2.5 L off today despite soft BP's. Will keep off schedule MWF for now.   Hypotension/ volume: BP low, LE edema resolved.  8/3 CT chest with ^ pulm edema though O2 requirement stable. As above. Is on midodrine and have been using albumin support with HD.  Anemia of ESRD:  Hgb 9s - s/p Aranesp on 7/27.  Hb stable this AM.  Secondary HPTH: Ca/Phos ok - continue home binders.  Held cinacalcet due to hypoCa - consider resuming at lower dose (was 180 daily) if Ca improves. Nutrition: Alb low, continue supplements. A-fib: On amiodarone + warfarin (on hold for chest tube), has IVC filter Hx type B aortic dissection s/p repair (2004)    Vinson Moselle  MD   [facility] 11/07/2022, 12:55 PM  Recent Labs  Lab 11/06/22 0138 11/07/22 0048  HGB 9.6* 10.1*  ALBUMIN 2.7* 2.6*  CALCIUM 7.1* 7.5*  PHOS 3.3 4.0  CREATININE 6.37* 7.36*  K 4.0 4.4    Inpatient medications:  (feeding supplement) PROSource Plus  30 mL Oral BID BM   amiodarone  200 mg Oral Daily   bisacodyl  10 mg Rectal Once   Chlorhexidine Gluconate Cloth  6 each Topical Daily   Chlorhexidine Gluconate Cloth  6 each Topical Q0600   darbepoetin (ARANESP) injection - DIALYSIS  40 mcg Subcutaneous Q Sat-1800   hydrocerin   Topical Daily   lidocaine  3 patch Transdermal Q24H   midodrine  15 mg Oral TID WC   polyethylene glycol  17 g Oral BID   senna-docusate  1 tablet Oral BID   sevelamer carbonate  4.8 g Oral TID WC   sodium chloride flush  10 mL Intrapleural Q8H   sodium chloride flush  10 mL Intrapleural Q8H    albumin human     anticoagulant sodium citrate 3.8 mL (11/07/22 1212)   azithromycin Stopped (11/06/22 1201)   calcium gluconate     cefTRIAXone (ROCEPHIN)  IV Stopped (11/06/22 0955)   acetaminophen, anticoagulant sodium citrate, calcium carbonate (dosed in mg elemental  calcium), camphor-menthol **AND** hydrOXYzine, docusate sodium, HYDROmorphone (DILAUDID) injection, ondansetron **OR** ondansetron (ZOFRAN) IV, oxyCODONE, sorbitol, zolpidem

## 2022-11-07 NOTE — Progress Notes (Signed)
Chest tube unclamped , pt is asleep denied any pain.

## 2022-11-07 NOTE — Procedures (Signed)
Pleural Fibrinolytic Administration Procedure Note  Corey [name]  [id number]  Sep 25, 1970  Date:11/07/22  Time:2:40 PM   Provider Performing: Audrie Lia   Procedure: Pleural Fibrinolysis Subsequent day [phone])  Indication(s) Fibrinolysis of complicated pleural effusion  Consent Risks of the procedure as well as the alternatives and risks of each were explained to the patient and/or caregiver.  Consent for the procedure was obtained.   Anesthesia None   Time Out Verified patient identification, verified procedure, site/side was marked, verified correct patient position, special equipment/implants available, medications/allergies/relevant history reviewed, required imaging and test results available.   Sterile Technique Hand hygiene, gloves   Procedure Description Existing pleural catheter was cleaned and accessed in sterile manner.  10mg  of tPA in 30cc of saline and 5mg  of dornase in 30cc of sterile water were injected into pleural space using existing pleural catheter.  Catheter will be clamped for 1 hour and then placed back to suction.   Complications/Tolerance Pain with instillation- treated with fentanyl x 2 doses   EBL None   Specimen(s) None  Corey Dunn, DO 11/07/22 2:41 PM [facility]  For contact information, see Amion. If no response to pager, please call PCCM consult pager. After hours, 7PM- 7AM, please call Elink.

## 2022-11-07 NOTE — Progress Notes (Signed)
Chest tube clamped by MD , continue to monitor.

## 2022-11-07 NOTE — Progress Notes (Signed)
   11/07/22 1215  Vitals  Temp 97.8 F (36.6 C)  Pulse Rate 60  Resp (!) 24  BP 112/73  SpO2 100 %  O2 Device Nasal Cannula  Weight 63.4 kg  Type of Weight Post-Dialysis  Oxygen Therapy  O2 Flow Rate (L/min) 4 L/min  Patient Activity (if Appropriate) In bed  Pulse Oximetry Type Continuous  Oximetry Probe Site Changed No  Post Treatment  Dialyzer Clearance Lightly streaked  Duration of HD Treatment -hour(s) 4 hour(s)  Hemodialysis Intake (mL) 0 mL  Liters Processed 71.2  Fluid Removed (mL) 2400 mL  Tolerated HD Treatment Yes   Received patient in bed to unit.  Alert and oriented.  Informed consent signed and in chart.   TX duration:4  Patient tolerated well.  Transported back to the room  Alert, without acute distress.  Hand-off given to patient's nurse.   Access used: [facility] Access issues: lines reversed  Total UF removed: 2400 Medication(s) given: none  Na citrate 4% to bilateral lumens for dwell   [facility]

## 2022-11-07 NOTE — Progress Notes (Signed)
Bil. Lower extremities cleansed with soap and water, dried with towel and Eucerin cream applied. Noted that skin on both legs  are getting soft and cleaner.,with small amount of black  flaking  skin.

## 2022-11-07 NOTE — Progress Notes (Signed)
PROGRESS NOTE    Corey [name]  XWR:[id number] DOB: [birth year] DOA: 10/27/2022 PCP: [name], NP   Chief Complaint  Patient presents with   Flank Pain   Shoulder Pain    Brief Narrative: Corey [name] is a 52 y.o. male with medical history significant of end-stage renal disease on hemodialysis TTS, paroxysmal atrial fibrillation on chronic Coumadin therapy, history of previous aortic dissection, peripheral vascular disease, history of osteoarthritis, history of neuropathy, who missed hemodialysis twice secondary to nausea vomiting and diarrhea.  Patient came to the ER with shortness of breath. Was found to be hypoxic with new oxygen requirement.  Also significant hyperkalemia.  Nephrology was consulted and patient is being dialyzed.  As part of his workup patient was found to have complex right-sided pleural effusion that looks loculated.  There is worried that this represents possible infection versus other loculated pleural effusion.  Patient is therefore being admitted to the hospital.  Patient currently being dialyzed as indicated.    Currently awaiting improvement in INR for chest tube placement.    Vascular was consulted for PVD.     Assessment & Plan:   Principal Problem:   Pleural effusion Active Problems:   ESRD on dialysis [facility])   Essential hypertension   Chronic diastolic CHF (congestive heart failure) (HCC)   Atrial fibrillation, chronic (HCC)   End-stage renal disease on hemodialysis (HCC)   AAA (abdominal aortic aneurysm) without rupture (HCC)   Hyperkalemia   PVD (peripheral vascular disease) (HCC)   Constipation   Lobar pneumonia (HCC)   Parapneumonic effusion  #1 acute hypoxic respiratory failure secondary to large loculated left pleural effusion/parapneumonic effusion. -Patient noted to have presented with shortness of breath noted to be hypoxic with new oxygen requirement. -Patient seen in consultation by nephrology underwent hemodialysis with no  significant improvement with shortness of breath. -Patient noted to have a complex left-sided pleural effusion which was concerning for loculation. -PCCM consulted and following and was awaiting for improvement with INR prior to chest tube placement and further evaluation. -Patient subsequently underwent chest tube placement per PCCM on 11/02/2022.  -Pleural fluid analysis according to lights criteria consistent with a exudative parapneumonic effusion.  -Pleural fluid cultures and Gram stain pending.  -Patient had repeat CT chest done and per pulmonary pigtail was called between atelectatic lung and diaphragm and pleural effusion noted to be persistent to larger.   -Pigtail catheter was suggested.  Pulmonary and patient given dornase/tPA due to loculations per pulmonary on 11/05/2022 and subsequently on 11/06/2018 full.  -Pulmonary recommending 10-day course of Rocephin and 5-day course of azithromycin. -PCCM following and appreciate input and recommendations.  2.  Left-sided pneumonia -Noted on chest x-ray.  Patient also noted to have a loculated left pleural effusion. -Patient underwent chest tube placement, pleural fluid analysis consistent with an exudative parapneumonic effusion. -Sputum Gram stain and culture pending.  -Blood cultures pending with no growth to date x 4 days..  -Continue IV Rocephin, IV azithromycin. -PCCM/pulmonary following.  3.  ESRD TTS -On HD. -Cinacalcet on hold due to concerns for hypoxia per nephrology. -Patient was to have HD on 11/03/2022, however due to hypotension and adjustment in midodrine patient did not have hemodialysis..  Patient was to have hemodialysis the evening of 11/03/2022, but declined at that time due to blood pressure being low and concerned that he might have a syncopal episode.   -IV albumin x 2 during the hospitalization.  -Patient underwent HD 11/04/2022.  -CT chest consistent with  volume overload..  -Hemodialysis offered 11/05/2022 however patient  noted to refuse.  -Currently in hemodialysis this morning, 11/07/2022.   -Per nephrology.  4.  Hypotension -Status post IV albumin x 2 and midodrine dose changed to 15 mg 3 times daily on 11/04/2022 per nephrology.   -Patient with improving blood pressure with systolics in the 90s this morning..    5.  PVD/bilateral lower extremities with chronic skin changes related to vascular disease -Patient seen in consultation by vascular surgery who recommended Eucerin daily, outpatient follow-up with vascular surgery.  6.  Constipation -Patient still with complaints of constipation.   -Patient noted to have refused MiraLAX and Senokot S on 11/04/2022 however states he took laxatives on 11/05/2022. -Sorbitol every 3 hours x 2 was ordered. -Dulcolax also ordered however patient noted to be refusing laxatives. -Patient subsequently agreed to taking laxatives on 11/06/2022.  -Patient also received milk of molasses enema 11/06/2022.   -Patient states small bowel movement.   -Patient hesitant to get another enema, will monitor for the next 24 hours and if still constipated will need enema.  -Continue current bowel regimen.  7.  Hyperkalemia -Resolved. -On HD.  8.  Paroxysmal atrial fibrillation -Amiodarone for rate control.  -Coumadin held for chest tube placement/thoracentesis which was done 11/02/2022.  -Pulmonary to advise when anticoagulation may be resumed.  9.  Chronic diastolic CHF -Volume being managed via hemodialysis. -Patient noted to have had hemodialysis, 11/04/2022.   -Patient with crackles noted on examination however patient refused hemodialysis on 11/05/2022. -CT chest done was consistent with some pulmonary edema.  -2D echo, 2020 with a EF of 60 to 65%, increased LV wall thickness. -Patient currently in hemodialysis today 11/07/2022. -Per nephrology.  10.  Hypertension>>> hypotension -BP noted to have soft blood pressures over the past few days.  -Patient not taking metoprolol at home. -IV  albumin ordered per nephrology and patient midodrine changed to 15 mg 3 times daily per nephrology on 11/04/2022.   -BP improving with systolics in the 90s this morning. -Pain medication also likely leading to soft/hypotensive blood pressures.  11.  Hypocalcemia -Corrected calcium of 8.62. -Patient noted to have received IV calcium gluconate x 2-3 doses during the hospitalization. -Repeat labs in the AM.     DVT prophylaxis: INR noted at 1.5 on 11/03/2022  Code Status: Full Family Communication: Updated patient.  No family at bedside. Disposition: TBD  Status is: Inpatient Remains inpatient appropriate because: Severity of illness   Consultants:  Nephrology: Dr. Arlean Hopping 10/27/2022 PCCM: Dr. Denese Killings 10/28/2022 Vascular surgery: Dr. Edilia Bo 10/30/2022  Procedures:  CT angiogram chest abdomen and pelvis 11/02/2023 CT chest abdomen and pelvis 10/27/2019 Chest x-ray 10/27/2022 Vascular ultrasound with ABIs 11/01/2022 30 insertion with image guidance/chest tube placement: Per PCCM, Dr. Denese Killings 11/02/2022 CT chest 11/05/2022 Pleural fibrinolysis per PCCM x 2: Dr. Celine Mans 11/05/2022, 11/06/2022  Antimicrobials:  Anti-infectives (From admission, onward)    Start     Dose/Rate Route Frequency Ordered Stop   11/03/22 0915  cefTRIAXone (ROCEPHIN) 2 g in sodium chloride 0.9 % 100 mL IVPB        2 g 200 mL/hr over 30 Minutes Intravenous Every 24 hours 11/03/22 0823 11/08/22 0914   11/03/22 0915  azithromycin (ZITHROMAX) 500 mg in sodium chloride 0.9 % 250 mL IVPB        500 mg 250 mL/hr over 60 Minutes Intravenous Every 24 hours 11/03/22 0823 11/08/22 0914         Subjective: In hemodialysis.  Still with  complaints of shortness of breath.  Denies any chest pain.  States no significant change with left-sided upper abdominal pain.  Stated had a small bowel movement yesterday.    Objective: Vitals:   11/07/22 0759 11/07/22 0805 11/07/22 0830 11/07/22 0900  BP: 103/62 111/61 99/64 100/65  Pulse:  64 (!) 59 62 60  Resp: (!) 21 20 17 16   Temp: 97.7 F (36.5 C)     TempSrc: Oral     SpO2: 100% 100% 98% 97%  Weight: 65.9 kg     Height:        Intake/Output Summary (Last 24 hours) at 11/07/2022 0929 Last data filed at 11/07/2022 0559 Gross per 24 hour  Intake 626.8 ml  Output 740 ml  Net -113.2 ml   Filed Weights   11/01/22 1030 11/04/22 1518 11/07/22 0759  Weight: 63 kg 66.4 kg 65.9 kg    Examination:  General exam: NAD. Respiratory system: Diffuse crackles.  Decreased breath sounds in the left base.  No wheezing.  Fair air movement.  Left-sided chest tube in place.   Cardiovascular system: RRR no murmurs rubs or gallops.  No JVD.  Chronic woody changes of lower extremities without pitting edema.    Gastrointestinal system: Abdomen is soft, nondistended, some tenderness to palpation left upper quadrant.  Positive bowel sounds.  No rebound.  No guarding.  Central nervous system: Alert and oriented. No focal neurological deficits. Extremities: Chronic woody changes of lower extremities without pitting edema.    Symmetric 5 x 5 power. Skin: No rashes, lesions or ulcers Psychiatry: Judgement and insight appear normal. Mood & affect appropriate.     Data Reviewed: I have personally reviewed following labs and imaging studies  CBC: Recent Labs  Lab 11/03/22 0316 11/04/22 0122 11/05/22 0058 11/06/22 0138 11/07/22 0048  WBC 12.0* 13.3* 14.3* 13.1* 11.5*  NEUTROABS 10.4* 11.3* 12.4* 11.2* 9.4*  HGB 9.8* 9.6* 9.6* 9.6* 10.1*  HCT 32.8* 31.9* 31.5* 32.5* 33.8*  MCV 97.3 97.6 96.3 96.4 98.0  PLT 217 204 192 193 233    Basic Metabolic Panel: Recent Labs  Lab 11/01/22 0139 11/02/22 0905 11/03/22 0316 11/04/22 0122 11/05/22 0058 11/06/22 0138 11/07/22 0048  NA 135   < > 136 136 136 137 138  K 4.8   < > 4.0 4.0 3.7 4.0 4.4  CL 96*   < > 97* 98 97* 99 98  CO2 23   < > 25 25 26 24 24   GLUCOSE 95   < > 96 110* 112* 96 100*  BUN 52*   < > 38* 45* 26* 38* 45*   CREATININE 8.47*   < > 7.04* 7.84* 5.09* 6.37* 7.36*  CALCIUM 6.3*   < > 6.2* 6.3* 6.8* 7.1* 7.5*  MG 1.9  --   --   --   --   --   --   PHOS 3.7  --  3.1 3.2 2.5 3.3 4.0   < > = values in this interval not displayed.    GFR: Estimated Creatinine Clearance: 10.9 mL/min (A) (by C-G formula based on SCr of 7.36 mg/dL (H)).  Liver Function Tests: Recent Labs  Lab 11/01/22 0139 11/02/22 4098 11/02/22 1743 11/03/22 0316 11/04/22 0122 11/05/22 0058 11/06/22 0138 11/07/22 0048  AST 6* 7*  --   --   --   --   --   --   ALT 7 5  --   --   --   --   --   --  ALKPHOS 209* 211*  --   --   --   --   --   --   BILITOT 0.4 0.4  --   --   --   --   --   --   PROT 6.2* 6.0* 6.4*  --   --   --   --   --   ALBUMIN 2.4* 2.4*  --  2.7* 2.7* 2.8* 2.7* 2.6*    CBG: No results for input(s): "GLUCAP" in the last 168 hours.    Recent Results (from the past 240 hour(s))  Body fluid culture w Gram Stain     Status: None   Collection Time: 11/02/22  4:44 PM   Specimen: Pleural Fluid  Result Value Ref Range Status   Specimen Description FLUID PLEURAL  Final   Special Requests NONE  Final   Gram Stain   Final    RARE WBC PRESENT, PREDOMINANTLY PMN NO ORGANISMS SEEN    Culture   Final    NO GROWTH 3 DAYS Performed at [facility], [address], [place], [place] [address]    Report Status 11/06/2022 FINAL  Final  Acid Fast Smear (AFB)     Status: None   Collection Time: 11/02/22  4:44 PM   Specimen: Pleural, Left; Pleural Fluid  Result Value Ref Range Status   AFB Specimen Processing Concentration  Final   Acid Fast Smear Negative  Final    Comment: (NOTE) Performed At: [facility] [address] [place], [place] [id number] Jolene Schimke MD AO:[id number]    Source (AFB) FLUID  Final    Comment: PLEURAL Performed at [facility], [address] [place], [place] [address]   Culture, blood (routine x 2) Call MD if unable to obtain prior to antibiotics being  given     Status: None (Preliminary result)   Collection Time: 11/03/22  8:49 AM   Specimen: BLOOD LEFT ARM  Result Value Ref Range Status   Specimen Description BLOOD LEFT ARM  Final   Special Requests   Final    BOTTLES DRAWN AEROBIC AND ANAEROBIC Blood Culture adequate volume   Culture   Final    NO GROWTH 4 DAYS Performed at [facility], [address], [place], [place] [address]    Report Status PENDING  Incomplete  Culture, blood (routine x 2) Call MD if unable to obtain prior to antibiotics being given     Status: None (Preliminary result)   Collection Time: 11/03/22  8:55 AM   Specimen: BLOOD LEFT ARM  Result Value Ref Range Status   Specimen Description BLOOD LEFT ARM  Final   Special Requests   Final    BOTTLES DRAWN AEROBIC AND ANAEROBIC Blood Culture adequate volume   Culture   Final    NO GROWTH 4 DAYS Performed at [facility], [address] [place], [place] [address]    Report Status PENDING  Incomplete         Radiology Studies: DG Abd Portable 1V  Result Date: 11/06/2022 CLINICAL DATA:  Abdominal pain EXAM: PORTABLE ABDOMEN - 1 VIEW COMPARISON:  None Available. FINDINGS: Vascular catheter tip at the inferior right atrium. IVC filter in place. Widespread medium to small vessel vascular calcification. Bowel gas pattern does not suggest obstruction. Atelectasis/infiltrate is present in both lower lobes. IMPRESSION: No evidence of bowel obstruction. IVC filter in place. Widespread medium to small vessel vascular calcification. Electronically Signed   By: Loraine Leriche  Shogry M.D.   On: 11/06/2022 17:23   DG Chest Port 1 View  Result Date: 11/06/2022 CLINICAL DATA:  Follow-up chest tube. EXAM: PORTABLE CHEST 1 VIEW COMPARISON:  CT 11/05/2022 FINDINGS: Stable position of left basilar chest tube. No pneumothorax. Right chest wall dialysis catheter noted with tips in the right atrium. The cardiomediastinal contours are unremarkable. Diffuse increased reticular  interstitial opacities are noted throughout both lungs compatible with interstitial edema. The left pleural effusion is unchanged in volume compared with 11/02/2022. Similar appearance of bilateral lower lobe atelectasis and airspace disease. IMPRESSION: 1. Stable position of left basilar chest tube. No pneumothorax. 2. Unchanged left pleural effusion and bilateral lower lobe atelectasis and airspace disease. 3. Diffuse interstitial edema. Electronically Signed   By: Signa Kell M.D.   On: 11/06/2022 09:28   CT CHEST WO CONTRAST  Result Date: 11/05/2022 CLINICAL DATA:  Pneumonia. EXAM: CT CHEST WITHOUT CONTRAST TECHNIQUE: Multidetector CT imaging of the chest was performed following the standard protocol without IV contrast. RADIATION DOSE REDUCTION: This exam was performed according to the departmental dose-optimization program which includes automated exposure control, adjustment of the mA and/or kV according to patient size and/or use of iterative reconstruction technique. COMPARISON:  Chest x-ray dated November 02, 2022. CT chest dated October 27, 2022. FINDINGS: Cardiovascular: Unchanged cardiomegaly. No significant pericardial effusion. Grossly unchanged type B thoracic aortic dissection with aneurysmal dilatation of the proximal descending thoracic aorta up to 4.5 cm in diameter. No ascending thoracic aortic aneurysm. Coronary, aortic arch, and branch vessel atherosclerotic vascular disease. Unchanged tunneled right internal jugular dialysis catheter with tip in the right atrium. Mediastinum/Nodes: No enlarged mediastinal or axillary lymph nodes. Thyroid gland, trachea, and esophagus demonstrate no significant findings. Lungs/Pleura: New left-sided chest tube centrally positioned at the left lung base. Moderate loculated left pleural effusion has mildly increased in size. Unchanged near complete collapse of the left lower lobe. Worsening smooth interlobular septal thickening, particularly in the right lung,  with new small ill-defined ground-glass densities. New trace amount of loculated pleural fluid along the right minor fissure. Increasing atelectasis at the right lung base. Similar atelectasis in the lingula. No consolidation or pneumothorax. Upper Abdomen: New small perihepatic ascites. Musculoskeletal: No acute or significant osseous findings. Sequelae of renal osteodystrophy again noted. IMPRESSION: 1. New left-sided chest tube centrally positioned at the left lung base. Moderate loculated left pleural effusion has mildly increased in size. Correlate with tube function. 2. Unchanged near complete collapse of the left lower lobe. Increasing atelectasis at the right lung base. 3. Worsening pulmonary edema.  New small perihepatic ascites. 4. Grossly unchanged type B thoracic aortic dissection with aneurysmal dilatation of the proximal descending thoracic aorta up to 4.5 cm in diameter. 5.  Aortic atherosclerosis (ICD10-I70.0). Electronically Signed   By: Obie Dredge M.D.   On: 11/05/2022 11:17        Scheduled Meds:  (feeding supplement) PROSource Plus  30 mL Oral BID BM   amiodarone  200 mg Oral Daily   bisacodyl  10 mg Rectal Once   Chlorhexidine Gluconate Cloth  6 each Topical Daily   Chlorhexidine Gluconate Cloth  6 each Topical Q0600   darbepoetin (ARANESP) injection - DIALYSIS  40 mcg Subcutaneous Q Sat-1800   hydrocerin   Topical Daily   lidocaine  3 patch Transdermal Q24H   midodrine  15 mg Oral TID WC   polyethylene glycol  17 g Oral BID   senna-docusate  1 tablet Oral BID   sevelamer carbonate  4.8 g Oral TID WC   sodium chloride flush  10 mL Intrapleural Q8H   sodium chloride flush  10 mL Intrapleural Q8H   Continuous Infusions:  albumin human     anticoagulant sodium citrate     azithromycin Stopped (11/06/22 1201)   calcium gluconate     cefTRIAXone (ROCEPHIN)  IV Stopped (11/06/22 0955)     LOS: 11 days    Time spent: 40 minutes    Ramiro Harvest, MD [facility]   To contact the attending provider between 7A-7P or the covering provider during after hours 7P-7A, please log into the web site [email or web address] and access using universal [place] password for that web site. If you do not have the password, please call the hospital operator.  11/07/2022, 9:29 AM

## 2022-11-08 ENCOUNTER — Inpatient Hospital Stay (HOSPITAL_COMMUNITY): Payer: 59

## 2022-11-08 DIAGNOSIS — I714 Abdominal aortic aneurysm, without rupture, unspecified: Secondary | ICD-10-CM | POA: Diagnosis not present

## 2022-11-08 DIAGNOSIS — J90 Pleural effusion, not elsewhere classified: Secondary | ICD-10-CM | POA: Diagnosis not present

## 2022-11-08 DIAGNOSIS — E875 Hyperkalemia: Secondary | ICD-10-CM | POA: Diagnosis not present

## 2022-11-08 DIAGNOSIS — K5900 Constipation, unspecified: Secondary | ICD-10-CM | POA: Diagnosis not present

## 2022-11-08 DIAGNOSIS — J189 Pneumonia, unspecified organism: Secondary | ICD-10-CM | POA: Diagnosis not present

## 2022-11-08 LAB — CULTURE, BLOOD (ROUTINE X 2)
Culture: NO GROWTH
Culture: NO GROWTH
Special Requests: ADEQUATE
Special Requests: ADEQUATE

## 2022-11-08 MED ORDER — SODIUM CHLORIDE 0.9 % IV SOLN
2.0000 g | INTRAVENOUS | Status: DC
  Administered 2022-11-08 – 2022-11-10 (×3): 2 g via INTRAVENOUS
  Filled 2022-11-08 (×3): qty 20

## 2022-11-08 NOTE — Progress Notes (Signed)
NAME:  Corey [name], MRN:  [id number], DOB:  [birth year], LOS: 12 ADMISSION DATE:  10/27/2022, CONSULTATION DATE:  10/28/2022 REFERRING MD:  Roney Mans, CHIEF COMPLAINT:  pleural effusion.   History of Present Illness:  52 year old man who is on hemodialysis with a left pleural effusion. He has had general decline in the state of health over the last several months with loss of appetite. He had a relatively abrupt onset of left chest discomfort which she like into a sensation of trapped gas in the left side with a pleuritic component.  Mild cough and somewhat increased shortness of breath.  Some subjective fever and chills.  CT shows a moderate to large loculated left pleural effusion.  Pertinent  Medical History   Past Medical History:  Diagnosis Date   Arthritis    hands and shoulders   Blindness and low vision    "Stargardt disease"   Dissection of aorta (HCC) 2004   a. s/p extensive repeair in 2004 in [place] complicated by ESRD, lower extremity paralysis, coma, and extended hospitalization of 2 years   ESRD (end stage renal disease) (HCC)    a. TTS   Headache    History of cardioversion 2014   Hypertension    Neuropathy    Non-healing non-surgical wound 03/2016   PAF (paroxysmal atrial fibrillation) (HCC)    a. s/p DCCV in 2014; b. on Coumadin; c. CHADS2VASc => 2 (HTN, vascular disease)   Paralysis (HCC)    due to dissection of aorta in 2004, lower extremities   Pneumonia      Significant Hospital Events: Including procedures, antibiotic start and stop dates in addition to other pertinent events   7/25 - CT shows loculated left pleural effusion with associated consolidation.  No evidence of lymphadenopathy or mass. 7/28 No acute issues overnight, remains on 3L [place]. INR remains elevated at 2.5, Vitamin K given.   7/31 chest tube placed 8/3 ct chest shows tube is in the lung base with enlarging pleural effusion, first day of intrapleural fibrinolytics 8/4 second dose of  intrapleural lytics  Interim History / Subjective:  He has some pain from the chest tube.  No new complaints.    Objective   Blood pressure 92/62, pulse 68, temperature 97.9 F (36.6 C), temperature source Oral, resp. rate 18, height 6\' 3"  (1.905 m), weight 63.4 kg, SpO2 100%.        Intake/Output Summary (Last 24 hours) at 11/08/2022 1024 Last data filed at 11/08/2022 0900 Gross per 24 hour  Intake 130 ml  Output 2700 ml  Net -2570 ml   Filed Weights   11/04/22 1518 11/07/22 0759 11/07/22 1215  Weight: 66.4 kg 65.9 kg 63.4 kg    Examination: Chronically ill appearing man lying in bed in NAD [place]/AT, eyes anicteric Breathing comfortably on [place], chest tube with less drainage, still chunky and somewhat purulent. New atrium at 90cc mid-day. S1S2, RRR Abd soft, NT Fistula RUE Skin warm, dry, no rashes.  Awake, alert, answering questions appropriately.  CXR personally reviewed> persistent fluid collection or opacity in LLL. Chest tube remains in appropriate position posteriorly.   Chest tube 200cc output during day shift, none recorded overnight. 190cc in atrium by 1pm on dayshift per RN   BUN 23 Cr 4.82 WBC 10.7 H/H 10.2/34.5 Platelets 233 Blood cultures NG final Pleural fluid: NG Pleural fluid AFB negative    Assessment & Plan:  Parapneumonic complicated left pleural effusion; not empyema- no pus, negative cultures/ gram stain. End-stage renal  disease on hemodialysis Unexplained weight loss Calciphylaxis Aortic dissection, type B, chronic & stable on imaging. P: -CT chest > most of effusion is evaluated, just need to wait for drainage to slow before discontinuing tube (90cc in atrium at mid-day today) -con't antibiotics-- still has dense consolidation in the LLL  -pain meds- oxycodone PRN -will reassess chest tube tomorrow.    Steffanie Dunn, DO 11/08/22 4:37 PM [facility]  For contact information, see Amion. If no response to pager, please  call PCCM consult pager. After hours, 7PM- 7AM, please call Elink.

## 2022-11-08 NOTE — Plan of Care (Signed)

## 2022-11-08 NOTE — Progress Notes (Signed)
PROGRESS NOTE    Corey [name]  ZOX:[id number] DOB: [birth year] DOA: 10/27/2022 PCP: [name], NP   Chief Complaint  Patient presents with   Flank Pain   Shoulder Pain    Brief Narrative: Corey [name] is a 52 y.o. male with medical history significant of end-stage renal disease on hemodialysis TTS, paroxysmal atrial fibrillation on chronic Coumadin therapy, history of previous aortic dissection, peripheral vascular disease, history of osteoarthritis, history of neuropathy, who missed hemodialysis twice secondary to nausea vomiting and diarrhea.  Patient came to the ER with shortness of breath. Was found to be hypoxic with new oxygen requirement.  Also significant hyperkalemia.  Nephrology was consulted and patient is being dialyzed.  As part of his workup patient was found to have complex right-sided pleural effusion that looks loculated.  There is worried that this represents possible infection versus other loculated pleural effusion.  Patient is therefore being admitted to the hospital.  Patient currently being dialyzed as indicated.    Currently awaiting improvement in INR for chest tube placement.    Vascular was consulted for PVD.     Assessment & Plan:   Principal Problem:   Pleural effusion Active Problems:   ESRD on dialysis [facility])   Essential hypertension   Chronic diastolic CHF (congestive heart failure) (HCC)   Atrial fibrillation, chronic (HCC)   End-stage renal disease on hemodialysis (HCC)   AAA (abdominal aortic aneurysm) without rupture (HCC)   Hyperkalemia   PVD (peripheral vascular disease) (HCC)   Constipation   Lobar pneumonia (HCC)   Parapneumonic effusion   Need for management of chest tube  #1 acute hypoxic respiratory failure secondary to large loculated left pleural effusion/parapneumonic effusion. -Patient noted to have presented with shortness of breath noted to be hypoxic with new oxygen requirement. -Patient seen in consultation by  nephrology underwent hemodialysis with no significant improvement with shortness of breath. -Patient noted to have a complex left-sided pleural effusion which was concerning for loculation. -PCCM consulted and following and was awaiting for improvement with INR prior to chest tube placement and further evaluation. -Patient subsequently underwent chest tube placement per PCCM on 11/02/2022.  -Pleural fluid analysis according to lights criteria consistent with a exudative parapneumonic effusion.  -Pleural fluid cultures and Gram stain pending.  -Patient had repeat CT chest done and per pulmonary pigtail was called between atelectatic lung and diaphragm and pleural effusion noted to be persistent to larger.   -Pigtail catheter was suggested.  Pulmonary and patient given dornase/tPA due to loculations per pulmonary on 11/05/2022, 11/06/2022, 11/07/2022. -Pulmonary recommending 10-day course of Rocephin and 5-day course of azithromycin. -PCCM following and appreciate input and recommendations.  2.  Left-sided pneumonia -Noted on chest x-ray.  Patient also noted to have a loculated left pleural effusion. -Patient underwent chest tube placement, pleural fluid analysis consistent with an exudative parapneumonic effusion. -Sputum Gram stain and culture pending.  -Blood cultures negative x 5 days.  -Continue IV Rocephin to complete a 10-day course due to loculated parapneumonic effusion. -Status post 5 days IV azithromycin. -PCCM/pulmonary following.  3.  ESRD TTS -On HD. -Cinacalcet on hold due to concerns for hypoxia per nephrology. -Patient was to have HD on 11/03/2022, however due to hypotension and adjustment in midodrine patient did not have hemodialysis..  Patient was to have hemodialysis the evening of 11/03/2022, but declined at that time due to blood pressure being low and concerned that he might have a syncopal episode.   -IV albumin x 2  during the hospitalization.  -Patient underwent HD 11/04/2022.   -CT chest consistent with volume overload..  -Hemodialysis offered 11/05/2022 however patient noted to refuse.  -Patient underwent hemodialysis 11/07/2022.  -Per nephrology resume hemodialysis on Thursday, 11/10/2022 per regular schedule. -Per nephrology.  4.  Hypotension -Status post IV albumin x 2 and midodrine dose changed to 15 mg 3 times daily on 11/04/2022 per nephrology.   -Patient with improving blood pressure with systolics in the 90s.  5.  PVD/bilateral lower extremities with chronic skin changes related to vascular disease -Patient seen in consultation by vascular surgery who recommended Eucerin daily, outpatient follow-up with vascular surgery.  6.  Constipation -Patient still with complaints of constipation.   -Patient noted to have refused MiraLAX and Senokot S on 11/04/2022 however states he took laxatives on 11/05/2022. -Sorbitol every 3 hours x 2 was ordered. -Dulcolax also ordered however patient noted to be refusing laxatives. -Patient subsequently agreed to taking laxatives on 11/06/2022.  -Patient also received milk of molasses enema 11/06/2022.   -Patient states small bowel movement.   -Patient hesitant to get another enema, and as such we will continue current bowel regimen.    7.  Hyperkalemia -Resolved. -On HD.  8.  Paroxysmal atrial fibrillation -Continue amiodarone for rate control.  -Coumadin held for chest tube placement/thoracentesis which was done 11/02/2022.  -Pulmonary to advise when anticoagulation may be resumed.  9.  Chronic diastolic CHF -Volume being managed via hemodialysis. -Patient noted to have had hemodialysis, 11/04/2022.   -Patient with crackles noted on examination however patient refused hemodialysis on 11/05/2022. -CT chest done was consistent with some pulmonary edema.  -2D echo, 2020 with a EF of 60 to 65%, increased LV wall thickness. -Patient had hemodialysis 11/07/2022.  -Per nephrology.  10.  Hypertension>>> hypotension -BP noted to have soft  blood pressures over the past few days.  -Patient not taking metoprolol at home. -IV albumin ordered per nephrology and patient midodrine changed to 15 mg 3 times daily per nephrology on 11/04/2022.   -BP improving with systolics in the 90s. -Pain medication also likely leading to soft/hypotensive blood pressures.  11.  Hypocalcemia -Corrected calcium of 8.9. -Patient noted to have received IV calcium gluconate x 2-3 doses during the hospitalization. -Repeat labs in the AM.     DVT prophylaxis: INR noted at 2.2.  Code Status: Full Family Communication: Updated patient.  No family at bedside. Disposition: TBD  Status is: Inpatient Remains inpatient appropriate because: Severity of illness   Consultants:  Nephrology: Dr. Arlean Hopping 10/27/2022 PCCM: Dr. Denese Killings 10/28/2022 Vascular surgery: Dr. Edilia Bo 10/30/2022  Procedures:  CT angiogram chest abdomen and pelvis 11/02/2023 CT chest abdomen and pelvis 10/27/2019 Chest x-ray 10/27/2022 Vascular ultrasound with ABIs 11/01/2022 30 insertion with image guidance/chest tube placement: Per PCCM, Dr. Denese Killings 11/02/2022 CT chest 11/05/2022 Pleural fibrinolysis per PCCM x 3: 11/05/2022, 11/06/2022, 11/07/2022  Antimicrobials:  Anti-infectives (From admission, onward)    Start     Dose/Rate Route Frequency Ordered Stop   11/03/22 0915  cefTRIAXone (ROCEPHIN) 2 g in sodium chloride 0.9 % 100 mL IVPB        2 g 200 mL/hr over 30 Minutes Intravenous Every 24 hours 11/03/22 0823 11/07/22 1345   11/03/22 0915  azithromycin (ZITHROMAX) 500 mg in sodium chloride 0.9 % 250 mL IVPB        500 mg 250 mL/hr over 60 Minutes Intravenous Every 24 hours 11/03/22 0823 11/07/22 1515         Subjective: Patient lying  in bed.  States does not feel any significant difference with shortness of breath.  Still with complaints of left upper abdominal pain.  States trying to have a bowel movement.  Asking when chest tube will be pulled.    Objective: Vitals:    11/07/22 2311 11/08/22 0246 11/08/22 0729 11/08/22 1045  BP: (!) 96/58  92/62 95/61  Pulse: 63  68 64  Resp: 20 20 18 19   Temp: 97.9 F (36.6 C) 98.2 F (36.8 C) 97.9 F (36.6 C) 98.2 F (36.8 C)  TempSrc: Oral Oral Oral Oral  SpO2: 100%  100% 100%  Weight:      Height:        Intake/Output Summary (Last 24 hours) at 11/08/2022 1056 Last data filed at 11/08/2022 0900 Gross per 24 hour  Intake 130 ml  Output 2700 ml  Net -2570 ml   Filed Weights   11/04/22 1518 11/07/22 0759 11/07/22 1215  Weight: 66.4 kg 65.9 kg 63.4 kg    Examination:  General exam: NAD. Respiratory system: Decreasing diffuse crackles.  Decreased breath sounds in the left base.  No wheezing.  Fair air movement.  Left-sided chest tube in place.   Cardiovascular system: Regular rate rhythm no murmurs rubs or gallops.  No JVD.  Chronic woody changes of lower extremities without pitting edema.    Gastrointestinal system: Abdomen soft, nondistended, some tenderness to palpation left upper quadrant.  Positive bowel sounds.  No rebound.  No guarding.  Central nervous system: Alert and oriented.  Moving extremities spontaneously.  No focal neurological deficits.  Extremities: Chronic woody changes of lower extremities without pitting edema. Symmetric 5 x 5 power. Skin: No rashes, lesions or ulcers Psychiatry: Judgement and insight appear normal. Mood & affect appropriate.     Data Reviewed: I have personally reviewed following labs and imaging studies  CBC: Recent Labs  Lab 11/03/22 0316 11/04/22 0122 11/05/22 0058 11/06/22 0138 11/07/22 0048 11/08/22 0332  WBC 12.0* 13.3* 14.3* 13.1* 11.5* 10.7*  NEUTROABS 10.4* 11.3* 12.4* 11.2* 9.4*  --   HGB 9.8* 9.6* 9.6* 9.6* 10.1* 10.2*  HCT 32.8* 31.9* 31.5* 32.5* 33.8* 34.5*  MCV 97.3 97.6 96.3 96.4 98.0 98.6  PLT 217 204 192 193 233 187    Basic Metabolic Panel: Recent Labs  Lab 11/04/22 0122 11/05/22 0058 11/06/22 0138 11/07/22 0048 11/08/22 0332  NA  136 136 137 138 136  K 4.0 3.7 4.0 4.4 4.0  CL 98 97* 99 98 96*  CO2 25 26 24 24 27   GLUCOSE 110* 112* 96 100* 88  BUN 45* 26* 38* 45* 23*  CREATININE 7.84* 5.09* 6.37* 7.36* 4.82*  CALCIUM 6.3* 6.8* 7.1* 7.5* 7.7*  PHOS 3.2 2.5 3.3 4.0 3.8    GFR: Estimated Creatinine Clearance: 16.1 mL/min (A) (by C-G formula based on SCr of 4.82 mg/dL (H)).  Liver Function Tests: Recent Labs  Lab 11/02/22 6962 11/02/22 1743 11/03/22 0316 11/04/22 0122 11/05/22 0058 11/06/22 0138 11/07/22 0048 11/08/22 0332  AST 7*  --   --   --   --   --   --   --   ALT 5  --   --   --   --   --   --   --   ALKPHOS 211*  --   --   --   --   --   --   --   BILITOT 0.4  --   --   --   --   --   --   --  PROT 6.0* 6.4*  --   --   --   --   --   --   ALBUMIN 2.4*  --    < > 2.7* 2.8* 2.7* 2.6* 2.5*   < > = values in this interval not displayed.    CBG: No results for input(s): "GLUCAP" in the last 168 hours.    Recent Results (from the past 240 hour(s))  Body fluid culture w Gram Stain     Status: None   Collection Time: 11/02/22  4:44 PM   Specimen: Pleural Fluid  Result Value Ref Range Status   Specimen Description FLUID PLEURAL  Final   Special Requests NONE  Final   Gram Stain   Final    RARE WBC PRESENT, PREDOMINANTLY PMN NO ORGANISMS SEEN    Culture   Final    NO GROWTH 3 DAYS Performed at [facility], [address], [place], [place] [address]    Report Status 11/06/2022 FINAL  Final  Acid Fast Smear (AFB)     Status: None   Collection Time: 11/02/22  4:44 PM   Specimen: Pleural, Left; Pleural Fluid  Result Value Ref Range Status   AFB Specimen Processing Concentration  Final   Acid Fast Smear Negative  Final    Comment: (NOTE) Performed At: [facility] [address] [place], [place] [id number] Jolene Schimke MD WN:[id number]    Source (AFB) FLUID  Final    Comment: PLEURAL Performed at [facility], [address], [place], [place] [address]    Culture, blood (routine x 2) Call MD if unable to obtain prior to antibiotics being given     Status: None   Collection Time: 11/03/22  8:49 AM   Specimen: BLOOD LEFT ARM  Result Value Ref Range Status   Specimen Description BLOOD LEFT ARM  Final   Special Requests   Final    BOTTLES DRAWN AEROBIC AND ANAEROBIC Blood Culture adequate volume   Culture   Final    NO GROWTH 5 DAYS Performed at [facility], [address] [place], [place] [address]    Report Status 11/08/2022 FINAL  Final  Culture, blood (routine x 2) Call MD if unable to obtain prior to antibiotics being given     Status: None   Collection Time: 11/03/22  8:55 AM   Specimen: BLOOD LEFT ARM  Result Value Ref Range Status   Specimen Description BLOOD LEFT ARM  Final   Special Requests   Final    BOTTLES DRAWN AEROBIC AND ANAEROBIC Blood Culture adequate volume   Culture   Final    NO GROWTH 5 DAYS Performed at [facility], [address] [place], [place] [address]    Report Status 11/08/2022 FINAL  Final         Radiology Studies: DG CHEST PORT 1 VIEW  Result Date: 11/07/2022 CLINICAL DATA:  Pleural effusion EXAM: PORTABLE CHEST 1 VIEW COMPARISON:  11/06/2022 FINDINGS: Left basilar chest tube remains in place. Small left pleural effusion, slightly decreased. Slightly improving aeration of the left lung base. Bilateral airspace opacities and right basilar atelectasis persist. Stable heart size. Right IJ central venous catheter remains positioned at the level of the right atrium. No pneumothorax. IMPRESSION: 1. Small left pleural effusion, slightly decreased. Slightly improving aeration of the left lung base. 2. Bilateral airspace opacities and right basilar atelectasis persist. Electronically Signed   By: Duanne Guess D.O.   On: 11/07/2022 11:29  DG Abd Portable 1V  Result Date: 11/06/2022 CLINICAL DATA:  Abdominal pain EXAM: PORTABLE ABDOMEN - 1 VIEW COMPARISON:  None Available. FINDINGS: Vascular  catheter tip at the inferior right atrium. IVC filter in place. Widespread medium to small vessel vascular calcification. Bowel gas pattern does not suggest obstruction. Atelectasis/infiltrate is present in both lower lobes. IMPRESSION: No evidence of bowel obstruction. IVC filter in place. Widespread medium to small vessel vascular calcification. Electronically Signed   By: Paulina Fusi M.D.   On: 11/06/2022 17:23        Scheduled Meds:  (feeding supplement) PROSource Plus  30 mL Oral BID BM   amiodarone  200 mg Oral Daily   bisacodyl  10 mg Rectal Once   Chlorhexidine Gluconate Cloth  6 each Topical Daily   Chlorhexidine Gluconate Cloth  6 each Topical Q0600   darbepoetin (ARANESP) injection - DIALYSIS  40 mcg Subcutaneous Q Sat-1800   hydrocerin   Topical Daily   lidocaine  3 patch Transdermal Q24H   midodrine  15 mg Oral TID WC   polyethylene glycol  17 g Oral BID   senna-docusate  1 tablet Oral BID   sevelamer carbonate  4.8 g Oral TID WC   sodium chloride flush  10 mL Intrapleural Q8H   sodium chloride flush  10 mL Intrapleural Q8H   Continuous Infusions:  albumin human     anticoagulant sodium citrate 3.8 mL (11/07/22 1212)   calcium gluconate       LOS: 12 days    Time spent: 35 minutes    Ramiro Harvest, MD [facility]   To contact the attending provider between 7A-7P or the covering provider during after hours 7P-7A, please log into the web site [email or web address] and access using universal [place] password for that web site. If you do not have the password, please call the hospital operator.  11/08/2022, 10:56 AM

## 2022-11-08 NOTE — TOC Progression Note (Signed)
Transition of Care [facility]) - Progression Note    Patient Details  Name: Corey [name] MRN: [id number] Date of Birth: May 03, 1970  Transition of Care [facility]) CM/SW Contact  Leone Haven, RN Phone Number: 11/08/2022, 1:18 PM  Clinical Narrative:    Patient continues with chest tube, for stat CT today, TOC following.   Expected Discharge Plan: Home w Home Health Services Barriers to Discharge: Continued Medical Work up  Expected Discharge Plan and Services       Living arrangements for the past 2 months: Single Family Home                                       Social Determinants of Health (SDOH) Interventions SDOH Screenings   Food Insecurity: No Food Insecurity (10/27/2022)  Housing: Medium Risk (10/27/2022)  Transportation Needs: No Transportation Needs (10/27/2022)  Utilities: Not At Risk (10/27/2022)  Alcohol Screen: Low Risk  (09/26/2022)  Depression (PHQ2-9): Medium Risk (09/26/2022)  Financial Resource Strain: Medium Risk (09/26/2022)  Physical Activity: Inactive (09/26/2022)  Social Connections: Socially Isolated (09/26/2022)  Stress: No Stress Concern Present (09/26/2022)  Tobacco Use: Low Risk  (10/27/2022)    Readmission Risk Interventions     No data to display

## 2022-11-08 NOTE — Progress Notes (Signed)
  [facility] Progress Note   Subjective:  Seen in room, no c/o.   Objective Vitals:   11/07/22 1941 11/07/22 2311 11/08/22 0246 11/08/22 0729  BP: (!) 96/54 (!) 96/58  92/62  Pulse:  63  68  Resp: 20 20 20 18   Temp: 97.6 F (36.4 C) 97.9 F (36.6 C) 98.2 F (36.8 C) 97.9 F (36.6 C)  TempSrc: Oral Oral Oral Oral  SpO2:  100%  100%  Weight:      Height:       Physical Exam General: Chronically ill appearing man, NAD. Nasal O2 in place. Awake and alert Heart: RRR; no murmur Lungs: L chest tube in place, clear anteriorly Abdomen: soft Extremities: chronic woody changes without  edema +cachectic Dialysis Access: TDC  c/d/i  OP HD: TTS [facility] 4h  2/2 bath  66kg   TDC   no heparin (allergy)  lock TDC w/ citrate - No ESA or VDRA - Binder: Renvela pwdr 4.8g TID - Sensipar 180mg  every day at home   Assessment/Plan: Acute hypoxic respiratory failure: In setting of missed HD. CXR with pleural effusion s/p chest tube 7/31. LE now resolved. Loculated L pleural effusion: s/p thoracentesis with cultures.  Afebrile.  Chest tube remains in . Had TPA infused 8/3. Per pulm. ESRD: usual TTS schedule, but is off schedule. Had HD yest 8/05. Will resume TTS with next HD on Thursday.  Hypotension/ volume: BP low, LE edema resolved.  8/3 CT chest with ^ pulm edema though O2 requirement stable. As above. Is on midodrine and have been using albumin support with HD. 3kg under dry wt, lower dry wt at dc.  Anemia of ESRD:  Hgb 9s - s/p Aranesp on 7/27.  Hb stable  Secondary HPTH: Ca/Phos ok - continue home binders.  Held cinacalcet due to hypoCa - consider resuming at lower dose (was 180 daily) if Ca improves. Nutrition: Alb low, continue supplements. A-fib: On amiodarone + warfarin (on hold for chest tube), has IVC filter Hx type B aortic dissection s/p repair (2004)    Vinson Moselle  MD  [facility] 11/08/2022, 10:31 AM  Recent Labs  Lab 11/07/22 0048 11/08/22 0332  HGB 10.1* 10.2*   ALBUMIN 2.6* 2.5*  CALCIUM 7.5* 7.7*  PHOS 4.0 3.8  CREATININE 7.36* 4.82*  K 4.4 4.0    Inpatient medications:  (feeding supplement) PROSource Plus  30 mL Oral BID BM   amiodarone  200 mg Oral Daily   bisacodyl  10 mg Rectal Once   Chlorhexidine Gluconate Cloth  6 each Topical Daily   Chlorhexidine Gluconate Cloth  6 each Topical Q0600   darbepoetin (ARANESP) injection - DIALYSIS  40 mcg Subcutaneous Q Sat-1800   hydrocerin   Topical Daily   lidocaine  3 patch Transdermal Q24H   midodrine  15 mg Oral TID WC   polyethylene glycol  17 g Oral BID   senna-docusate  1 tablet Oral BID   sevelamer carbonate  4.8 g Oral TID WC   sodium chloride flush  10 mL Intrapleural Q8H   sodium chloride flush  10 mL Intrapleural Q8H    albumin human     anticoagulant sodium citrate 3.8 mL (11/07/22 1212)   calcium gluconate     acetaminophen, anticoagulant sodium citrate, calcium carbonate (dosed in mg elemental calcium), camphor-menthol **AND** hydrOXYzine, docusate sodium, HYDROmorphone (DILAUDID) injection, ondansetron **OR** ondansetron (ZOFRAN) IV, oxyCODONE, sorbitol, zolpidem

## 2022-11-09 DIAGNOSIS — J90 Pleural effusion, not elsewhere classified: Secondary | ICD-10-CM | POA: Diagnosis not present

## 2022-11-09 DIAGNOSIS — Z4682 Encounter for fitting and adjustment of non-vascular catheter: Secondary | ICD-10-CM | POA: Diagnosis not present

## 2022-11-09 MED ORDER — CHLORHEXIDINE GLUCONATE CLOTH 2 % EX PADS
6.0000 | MEDICATED_PAD | Freq: Every day | CUTANEOUS | Status: DC
  Administered 2022-11-10: 6 via TOPICAL

## 2022-11-09 NOTE — Telephone Encounter (Signed)
Appt was put in a recall.

## 2022-11-09 NOTE — Plan of Care (Signed)
Pt progressing

## 2022-11-09 NOTE — Progress Notes (Signed)
Patient is alert and oriented x4. Blind in both eyes so announce self when entering the room and explained every intervention. L-chest tube output was 112 mls of serosanguineous drainage. Received pain med last night for rib cage pain. Stated that he would hold off on taking Renvela this morning due to him not being sure if he would eat his breakfast. Will report to day team. Additional needs denied.

## 2022-11-09 NOTE — Progress Notes (Signed)
   NAME:  Corey [name], MRN:  [id number], DOB:  Mar 25, 1971, LOS: 13 ADMISSION DATE:  10/27/2022, CONSULTATION DATE:  10/28/2022 REFERRING MD:  Roney Mans, CHIEF COMPLAINT:  pleural effusion.   History of Present Illness:  52 year old man who is on hemodialysis with a left pleural effusion. He has had general decline in the state of health over the last several months with loss of appetite. He had a relatively abrupt onset of left chest discomfort which she like into a sensation of trapped gas in the left side with a pleuritic component.  Mild cough and somewhat increased shortness of breath.  Some subjective fever and chills.  CT shows a moderate to large loculated left pleural effusion.  Pertinent  Medical History   Past Medical History:  Diagnosis Date   Arthritis    hands and shoulders   Blindness and low vision    "Stargardt disease"   Dissection of aorta (HCC) 2004   a. s/p extensive repeair in 2004 in [place] complicated by ESRD, lower extremity paralysis, coma, and extended hospitalization of 2 years   ESRD (end stage renal disease) (HCC)    a. TTS   Headache    History of cardioversion 2014   Hypertension    Neuropathy    Non-healing non-surgical wound 03/2016   PAF (paroxysmal atrial fibrillation) (HCC)    a. s/p DCCV in 2014; b. on Coumadin; c. CHADS2VASc => 2 (HTN, vascular disease)   Paralysis (HCC)    due to dissection of aorta in 2004, lower extremities   Pneumonia      Significant Hospital Events: Including procedures, antibiotic start and stop dates in addition to other pertinent events   7/25 - CT shows loculated left pleural effusion with associated consolidation.  No evidence of lymphadenopathy or mass. 7/28 No acute issues overnight, remains on 3L [place]. INR remains elevated at 2.5, Vitamin K given.   7/31 chest tube placed 8/3 ct chest shows tube is in the lung base with enlarging pleural effusion, first day of intrapleural fibrinolytics 8/4 second dose of  intrapleural lytics  Interim History / Subjective:  Poorly responsive this morning no chest x-ray available   Objective   Blood pressure 98/61, pulse (!) 56, temperature 97.7 F (36.5 C), temperature source Oral, resp. rate 14, height 6\' 3"  (1.905 m), weight 63.4 kg, SpO2 98%.        Intake/Output Summary (Last 24 hours) at 11/09/2022 7829 Last data filed at 11/09/2022 5621 Gross per 24 hour  Intake 344.3 ml  Output 232 ml  Net 112.3 ml   Filed Weights   11/04/22 1518 11/07/22 0759 11/07/22 1215  Weight: 66.4 kg 65.9 kg 63.4 kg    Examination: Chronically ill-appearing male poorly responsive JVD lymphadenopathy is appreciated Decreased breath sounds throughout Chest tube with approximately 220 cc drainage without air leak Heart sounds are regular regular rate and rhythm Abdomen soft nontender positive bowel sounds Extremities without edema    Assessment & Plan:  Parapneumonic complicated left pleural effusion; not empyema- no pus, negative cultures/ gram stain. End-stage renal disease on hemodialysis Unexplained weight loss Calciphylaxis Aortic dissection, type B, chronic & stable on imaging. P: 220 cc of drainage from chest tube over last 24 hours Continue antibiotics As needed pain medication DC chest tube once drainage is under 150 cc daily    Brett Canales  ACNP Acute Care Nurse Practitioner [facility] Please consult Amion 11/09/2022, 9:53 AM

## 2022-11-09 NOTE — Plan of Care (Signed)

## 2022-11-09 NOTE — Progress Notes (Signed)
  [facility] Progress Note   Subjective:  Seen in room, no c/o.   Objective Vitals:   11/09/22 0037 11/09/22 0510 11/09/22 0723 11/09/22 1136  BP:  (!) 90/58 98/61 (!) 92/57  Pulse:  (!) 56  (!) 57  Resp: 13 14 14  (!) 21  Temp:  97.7 F (36.5 C) 97.7 F (36.5 C) 97.6 F (36.4 C)  TempSrc:  Oral Oral Oral  SpO2:  99% 98% 97%  Weight:      Height:       Physical Exam General: Chronically ill appearing man, NAD. Nasal O2 in place Heart: RRR; no murmur Lungs: L chest tube in place, clear anteriorly Abdomen: soft Extremities: chronic woody changes without  edema +cachectic Dialysis Access: TDC  c/d/i  OP HD: TTS [facility] 4h  2/2 bath  66kg   TDC   no heparin (allergy)  lock TDC w/ citrate - No ESA or VDRA - Binder: Renvela pwdr 4.8g TID - Sensipar 180mg  every day at home   Assessment/Plan: Acute hypoxic respiratory failure: In setting of missed HD. CXR with pleural effusion s/p chest tube 7/31. LE now resolved. Loculated L pleural effusion: s/p thoracentesis with cultures.  Afebrile.  Chest tube remains in. Had TPA infused 8/3. Per pulm. ESRD: usual TTS schedule, but is off schedule. Had HD Monday here. Next HD tomorrow.  Hypotension/ volume: BP low, LE edema resolved.  Is on midodrine. 3kg under dry wt, lower dry wt at dc. Euvolemic now.  Anemia of ESRD:  Hgb 9s - s/p Aranesp on 7/27.  Hb stable  Secondary HPTH: Ca/Phos ok - continue home binders.  Held cinacalcet due to hypoCa - consider resuming at lower dose (was 180 daily) if Ca improves. Nutrition: Alb low, continue supplements. A-fib: On amiodarone + warfarin (on hold for chest tube), has IVC filter Hx type B aortic dissection s/p repair (2004)  Paralysis - due to aortic dissection in 2004    Rob   MD  [facility] 11/09/2022, 3:22 PM  Recent Labs  Lab 11/08/22 0332 11/09/22 0221  HGB 10.2* 9.8*  ALBUMIN 2.5* 2.4*  CALCIUM 7.7* 7.8*  PHOS 3.8 4.3  CREATININE 4.82* 5.52*  K 4.0 4.0     Inpatient medications:  (feeding supplement) PROSource Plus  30 mL Oral BID BM   amiodarone  200 mg Oral Daily   Chlorhexidine Gluconate Cloth  6 each Topical Q0600   darbepoetin (ARANESP) injection - DIALYSIS  40 mcg Subcutaneous Q Sat-1800   hydrocerin   Topical Daily   lidocaine  3 patch Transdermal Q24H   midodrine  15 mg Oral TID WC   polyethylene glycol  17 g Oral BID   senna-docusate  1 tablet Oral BID   sevelamer carbonate  4.8 g Oral TID WC   sodium chloride flush  10 mL Intrapleural Q8H   sodium chloride flush  10 mL Intrapleural Q8H    anticoagulant sodium citrate 100 mL/hr at 11/08/22 1735   cefTRIAXone (ROCEPHIN)  IV 200 mL/hr at 11/08/22 1735   acetaminophen, anticoagulant sodium citrate, calcium carbonate (dosed in mg elemental calcium), camphor-menthol **AND** hydrOXYzine, docusate sodium, HYDROmorphone (DILAUDID) injection, ondansetron **OR** ondansetron (ZOFRAN) IV, oxyCODONE, sorbitol, zolpidem

## 2022-11-09 NOTE — Progress Notes (Signed)
PROGRESS NOTE    Corey [name]  WGN:[id number] DOB: [birth year] DOA: 10/27/2022 PCP: [name], NP     Brief Narrative:  Corey [name] is a 52 y.o. male with medical history significant of end-stage renal disease on hemodialysis TTS, paroxysmal atrial fibrillation on chronic Coumadin therapy, history of previous aortic dissection, peripheral vascular disease, history of osteoarthritis, history of neuropathy, who missed hemodialysis twice secondary to nausea vomiting and diarrhea.  Patient came to the ER with shortness of breath. Was found to be hypoxic with new oxygen requirement.  Also significant hyperkalemia.  Nephrology was consulted and patient is being dialyzed.  As part of his workup patient was found to have complex left-sided pleural effusion that looked loculated.  Patient underwent chest tube placement by PCCM 7/31 and several rounds of tPA.  New events last 24 hours / Subjective: Patient complains of pain at the chest tube site  Assessment & Plan:   Principal Problem:   Pleural effusion Active Problems:   ESRD on dialysis [facility])   Essential hypertension   Chronic diastolic CHF (congestive heart failure) (HCC)   Atrial fibrillation, chronic (HCC)   End-stage renal disease on hemodialysis (HCC)   AAA (abdominal aortic aneurysm) without rupture (HCC)   Hyperkalemia   PVD (peripheral vascular disease) (HCC)   Constipation   Lobar pneumonia (HCC)   Parapneumonic effusion   Need for management of chest tube   Acute respiratory failure secondary to large loculated left-sided pleural effusion/parapneumonic effusion -Status post chest tube placement by [facility] 7/31 -Pleural fluid analysis consistent with exudative parapneumonic effusion -Status post tPA -PCCM following for chest tube management, discontinue chest tube once drainage <150 cc daily -Rocephin, completed azithromycin  ESRD -HD TTS -Nephrology following  Hypotension -Status post IV albumin x 2,  midodrine  Paroxysmal A-fib -Amiodarone -Coumadin currently on hold, ??Resume once okay with PCCM  Chronic diastolic CHF -Volume management with HD  PVD -Outpatient follow-up  Constipation -Bowel regimen   DVT prophylaxis: Coumadin PTA   Code Status: Full Family Communication: None at bedside  Disposition Plan: Home Status is: Inpatient Remains inpatient appropriate because: Chest tube in place    Antimicrobials:  Anti-infectives (From admission, onward)    Start     Dose/Rate Route Frequency Ordered Stop   11/08/22 1800  cefTRIAXone (ROCEPHIN) 2 g in sodium chloride 0.9 % 100 mL IVPB        2 g 200 mL/hr over 30 Minutes Intravenous Every 24 hours 11/08/22 1656 11/13/22 1759   11/03/22 0915  cefTRIAXone (ROCEPHIN) 2 g in sodium chloride 0.9 % 100 mL IVPB        2 g 200 mL/hr over 30 Minutes Intravenous Every 24 hours 11/03/22 0823 11/08/22 1701   11/03/22 0915  azithromycin (ZITHROMAX) 500 mg in sodium chloride 0.9 % 250 mL IVPB        500 mg 250 mL/hr over 60 Minutes Intravenous Every 24 hours 11/03/22 0823 11/08/22 1701        Objective: Vitals:   11/09/22 0037 11/09/22 0510 11/09/22 0723 11/09/22 1136  BP:  (!) 90/58 98/61 (!) 92/57  Pulse:  (!) 56  (!) 57  Resp: 13 14 14  (!) 21  Temp:  97.7 F (36.5 C) 97.7 F (36.5 C) 97.6 F (36.4 C)  TempSrc:  Oral Oral Oral  SpO2:  99% 98% 97%  Weight:      Height:        Intake/Output Summary (Last 24 hours) at 11/09/2022 1231 Last  data filed at 11/09/2022 1143 Gross per 24 hour  Intake 354.3 ml  Output 262 ml  Net 92.3 ml   Filed Weights   11/04/22 1518 11/07/22 0759 11/07/22 1215  Weight: 66.4 kg 65.9 kg 63.4 kg    Examination:  General exam: Appears calm and comfortable  Respiratory system: Clear to auscultation. Respiratory effort normal.  Cardiovascular system: S1 & S2 heard, RRR. No murmurs. No pedal edema. Gastrointestinal system: Abdomen is nondistended, soft Central nervous system: Alert  a Extremities: Symmetric in appearance  Skin: No rashes, lesions or ulcers on exposed skin  Psychiatry: Judgement and insight appear normal. Mood & affect appropriate.   Data Reviewed: I have personally reviewed following labs and imaging studies  CBC: Recent Labs  Lab 11/03/22 0316 11/04/22 0122 11/05/22 0058 11/06/22 0138 11/07/22 0048 11/08/22 0332 11/09/22 0221  WBC 12.0* 13.3* 14.3* 13.1* 11.5* 10.7* 10.1  NEUTROABS 10.4* 11.3* 12.4* 11.2* 9.4*  --   --   HGB 9.8* 9.6* 9.6* 9.6* 10.1* 10.2* 9.8*  HCT 32.8* 31.9* 31.5* 32.5* 33.8* 34.5* 33.5*  MCV 97.3 97.6 96.3 96.4 98.0 98.6 97.7  PLT 217 204 192 193 233 187 169   Basic Metabolic Panel: Recent Labs  Lab 11/05/22 0058 11/06/22 0138 11/07/22 0048 11/08/22 0332 11/09/22 0221  NA 136 137 138 136 135  K 3.7 4.0 4.4 4.0 4.0  CL 97* 99 98 96* 97*  CO2 26 24 24 27 25   GLUCOSE 112* 96 100* 88 87  BUN 26* 38* 45* 23* 31*  CREATININE 5.09* 6.37* 7.36* 4.82* 5.52*  CALCIUM 6.8* 7.1* 7.5* 7.7* 7.8*  PHOS 2.5 3.3 4.0 3.8 4.3   GFR: Estimated Creatinine Clearance: 14 mL/min (A) (by C-G formula based on SCr of 5.52 mg/dL (H)). Liver Function Tests: Recent Labs  Lab 11/02/22 1743 11/03/22 0316 11/05/22 0058 11/06/22 0138 11/07/22 0048 11/08/22 0332 11/09/22 0221  PROT 6.4*  --   --   --   --   --   --   ALBUMIN  --    < > 2.8* 2.7* 2.6* 2.5* 2.4*   < > = values in this interval not displayed.   No results for input(s): "LIPASE", "AMYLASE" in the last 168 hours. No results for input(s): "AMMONIA" in the last 168 hours. Coagulation Profile: Recent Labs  Lab 11/03/22 0316 11/05/22 1243 11/08/22 0332  INR 1.5* 1.5* 2.2*   Cardiac Enzymes: No results for input(s): "CKTOTAL", "CKMB", "CKMBINDEX", "TROPONINI" in the last 168 hours. BNP (last 3 results) No results for input(s): "PROBNP" in the last 8760 hours. HbA1C: No results for input(s): "HGBA1C" in the last 72 hours. CBG: No results for input(s): "GLUCAP"  in the last 168 hours. Lipid Profile: No results for input(s): "CHOL", "HDL", "LDLCALC", "TRIG", "CHOLHDL", "LDLDIRECT" in the last 72 hours. Thyroid Function Tests: No results for input(s): "TSH", "T4TOTAL", "FREET4", "T3FREE", "THYROIDAB" in the last 72 hours. Anemia Panel: No results for input(s): "VITAMINB12", "FOLATE", "FERRITIN", "TIBC", "IRON", "RETICCTPCT" in the last 72 hours. Sepsis Labs: No results for input(s): "PROCALCITON", "LATICACIDVEN" in the last 168 hours.  Recent Results (from the past 240 hour(s))  Body fluid culture w Gram Stain     Status: None   Collection Time: 11/02/22  4:44 PM   Specimen: Pleural Fluid  Result Value Ref Range Status   Specimen Description FLUID PLEURAL  Final   Special Requests NONE  Final   Gram Stain   Final    RARE WBC PRESENT, PREDOMINANTLY PMN NO ORGANISMS  SEEN    Culture   Final    NO GROWTH 3 DAYS Performed at [facility], [address], [place], [place] [address]    Report Status 11/06/2022 FINAL  Final  Fungus Culture With Stain     Status: None (Preliminary result)   Collection Time: 11/02/22  4:44 PM   Specimen: Pleural Fluid  Result Value Ref Range Status   Fungus Stain Final report  Final    Comment: (NOTE) Performed At: [facility] [address] [place], [place] [id number] Jolene Schimke MD UY:[id number]    Fungus (Mycology) Culture PENDING  Incomplete   Fungal Source FLUID  Final    Comment: PLEURAL Performed at [facility], [address], [place], [place] [address]   Acid Fast Smear (AFB)     Status: None   Collection Time: 11/02/22  4:44 PM   Specimen: Pleural, Left; Pleural Fluid  Result Value Ref Range Status   AFB Specimen Processing Concentration  Final   Acid Fast Smear Negative  Final    Comment: (NOTE) Performed At: [facility] [address] [place], [place] [id number] Jolene Schimke MD AC:[id number]    Source (AFB) FLUID  Final    Comment: PLEURAL Performed at  [facility], [address] [place], [place] [address]   Fungus Culture Result     Status: None   Collection Time: 11/02/22  4:44 PM  Result Value Ref Range Status   Result 1 Comment  Final    Comment: (NOTE) KOH/Calcofluor preparation:  no fungus observed. Performed At: [facility] [address] [place], [place] [id number] Jolene Schimke MD RK:[id number]   Culture, blood (routine x 2) Call MD if unable to obtain prior to antibiotics being given     Status: None   Collection Time: 11/03/22  8:49 AM   Specimen: BLOOD LEFT ARM  Result Value Ref Range Status   Specimen Description BLOOD LEFT ARM  Final   Special Requests   Final    BOTTLES DRAWN AEROBIC AND ANAEROBIC Blood Culture adequate volume   Culture   Final    NO GROWTH 5 DAYS Performed at [facility], [address], [place], [place] [address]    Report Status 11/08/2022 FINAL  Final  Culture, blood (routine x 2) Call MD if unable to obtain prior to antibiotics being given     Status: None   Collection Time: 11/03/22  8:55 AM   Specimen: BLOOD LEFT ARM  Result Value Ref Range Status   Specimen Description BLOOD LEFT ARM  Final   Special Requests   Final    BOTTLES DRAWN AEROBIC AND ANAEROBIC Blood Culture adequate volume   Culture   Final    NO GROWTH 5 DAYS Performed at [facility], [address] [place], [place] [address]    Report Status 11/08/2022 FINAL  Final      Radiology Studies: CT CHEST WO CONTRAST  Result Date: 11/08/2022 CLINICAL DATA:  Pneumonia, complications suspected. EXAM: CT CHEST WITHOUT CONTRAST TECHNIQUE: Multidetector CT imaging of the chest was performed following the standard protocol without IV contrast. RADIATION DOSE REDUCTION: This exam was performed according to the departmental dose-optimization program which includes automated exposure control, adjustment of the mA and/or kV according to patient size and/or use of iterative reconstruction technique.  COMPARISON:  Chest radiograph 11/08/2022 and chest CT 11/05/2022 and CT 10/27/2022 FINDINGS: Cardiovascular: Right jugular dialysis catheter with the tip in the right atrium.  Heart is enlarged. Diffuse coronary artery calcifications. Again noted is enlargement of the distal aortic arch and descending thoracic aorta. Again noted is a dissection involving the descending thoracic aorta compatible with a type B aortic dissection. Proximal descending thoracic aorta measures roughly 4.6 cm in diameter and minimally changed. Mid descending thoracic aorta roughly measures 3.8 cm and similar to the previous examination. Limited evaluation of the dissection without intravascular contrast. Distal descending thoracic aorta roughly measures 3.6 cm and minimally changed. Mediastinum/Nodes: Again noted are multiple small mediastinal lymph nodes that are similar to the recent comparison examination. Question enlarged left internal mammary lymph node versus venous structures on image 51/3. Small axillary lymph nodes bilaterally. No gross abnormality to the esophagus. Small amount of low-density material fluid along the anterior mediastinum on image 60/3 is similar to the recent comparison examination. Lungs/Pleura: Left pleural effusion has decreased in size. Small amount of residual loculated left pleural fluid particularly in the left upper chest on image 35/3. The left pigtail chest tube is still in place. Tube has slightly retracted since the previous CT. Aeration in the left lower lobe has slightly improved but there continues to be air bronchograms and consolidation throughout the left lower lobe. Persistent areas of peripheral consolidation in the right lower lobe. Septal thickening with ground-glass densities in both lungs particularly in the right upper lobe. Findings could represent asymmetric pulmonary edema but cannot exclude infection. Upper Abdomen: Limited evaluation of the upper abdominal structures. Musculoskeletal:  Diffuse sclerosis involving the vertebral bodies most compatible with renal osteodystrophy. No acute bone abnormality. IMPRESSION: 1. Loculated left pleural effusion has decreased in size. Small amount of residual left pleural fluid. Left chest tube is still in place but has been slightly retracted. Negative for a pneumothorax. 2. Persistent consolidation in both lower lobes, left side greater than right. Slightly improved aeration in the left lower lobe since 11/05/2022. 3. Ground-glass densities and scattered areas of septal thickening in both lungs particularly in the right upper lobe. These findings could be related edema and/or infection. 4. Mildly prominent lymph nodes throughout the chest are nonspecific but could be reactive. 5. Type B aortic dissection. No significant change in the size or configuration of descending thoracic aorta. Electronically Signed   By: Richarda Overlie M.D.   On: 11/08/2022 15:42   DG CHEST PORT 1 VIEW  Result Date: 11/08/2022 CLINICAL DATA:  52 year old male with left chest tube, type B thoracic aortic dissection, pleural effusion,. EXAM: PORTABLE CHEST 1 VIEW COMPARISON:  Portable chest 11/07/2022 and earlier. FINDINGS: Portable AP semi upright view at 1006 hours. Right chest dual lumen dialysis type catheter and partially visible axillary vascular stent appears stable. Stable lung volumes and mediastinal contours. Stable left lung base pigtail chest tube. No pneumothorax. Ongoing coarse bilateral pulmonary interstitial opacity, and patchy left greater than right lung base opacity is stable from yesterday. Visualized tracheal air column is within normal limits. Stable visualized osseous structures. Negative visible bowel gas. IMPRESSION: 1.  Stable lines and tubes.  No pneumothorax. 2. Stable coarse pulmonary interstitial opacity and left greater than right lung base opacity. Favor a combination of pulmonary interstitial edema, lower lobe collapse or consolidation, and small residual  left pleural effusion. Electronically Signed   By: Odessa Fleming M.D.   On: 11/08/2022 12:53      Scheduled Meds:  (feeding supplement) PROSource Plus  30 mL Oral BID BM   amiodarone  200 mg Oral Daily   bisacodyl  10 mg Rectal Once  Chlorhexidine Gluconate Cloth  6 each Topical Q0600   darbepoetin (ARANESP) injection - DIALYSIS  40 mcg Subcutaneous Q Sat-1800   hydrocerin   Topical Daily   lidocaine  3 patch Transdermal Q24H   midodrine  15 mg Oral TID WC   polyethylene glycol  17 g Oral BID   senna-docusate  1 tablet Oral BID   sevelamer carbonate  4.8 g Oral TID WC   sodium chloride flush  10 mL Intrapleural Q8H   sodium chloride flush  10 mL Intrapleural Q8H   Continuous Infusions:  albumin human     anticoagulant sodium citrate 100 mL/hr at 11/08/22 1735   calcium gluconate     cefTRIAXone (ROCEPHIN)  IV 200 mL/hr at 11/08/22 1735     LOS: 13 days   Time spent: 35 minutes   Noralee Stain, DO [facility] 11/09/2022, 12:31 PM   Available via Epic secure chat 7am-7pm After these hours, please refer to coverage provider listed on [email or web address]

## 2022-11-10 ENCOUNTER — Inpatient Hospital Stay (HOSPITAL_COMMUNITY): Payer: 59

## 2022-11-10 DIAGNOSIS — J90 Pleural effusion, not elsewhere classified: Secondary | ICD-10-CM | POA: Diagnosis not present

## 2022-11-10 MED ORDER — HEPARIN SODIUM (PORCINE) 1000 UNIT/ML IJ SOLN
INTRAMUSCULAR | Status: AC
  Filled 2022-11-10: qty 4

## 2022-11-10 NOTE — TOC Progression Note (Signed)
Transition of Care [facility]) - Progression Note    Patient Details  Name: Corey [name] MRN: [id number] Date of Birth: 1970-12-13  Transition of Care [facility]) CM/SW Contact  Harriet Masson, RN Phone Number: 11/10/2022, 3:57 PM  Clinical Narrative:     Emailed [email or web address] patient's PCS form.   Expected Discharge Plan: Home w Home Health Services Barriers to Discharge: Continued Medical Work up  Expected Discharge Plan and Services       Living arrangements for the past 2 months: Single Family Home                                       Social Determinants of Health (SDOH) Interventions SDOH Screenings   Food Insecurity: No Food Insecurity (10/27/2022)  Housing: Medium Risk (10/27/2022)  Transportation Needs: No Transportation Needs (10/27/2022)  Utilities: Not At Risk (10/27/2022)  Alcohol Screen: Low Risk  (09/26/2022)  Depression (PHQ2-9): Medium Risk (09/26/2022)  Financial Resource Strain: Medium Risk (09/26/2022)  Physical Activity: Inactive (09/26/2022)  Social Connections: Socially Isolated (09/26/2022)  Stress: No Stress Concern Present (09/26/2022)  Tobacco Use: Low Risk  (10/27/2022)    Readmission Risk Interventions     No data to display

## 2022-11-10 NOTE — Progress Notes (Addendum)
   11/10/22 1813  Vitals  Temp 97.6 F (36.4 C)  Pulse Rate (!) 57  Resp 15  BP (!) 102/56  SpO2 94 %  O2 Device Room Air  Type of Weight Post-Dialysis  Oxygen Therapy  O2 Flow Rate (L/min) 4 L/min  Patient Activity (if Appropriate) In bed  Pulse Oximetry Type Continuous  Post Treatment  Dialyzer Clearance Heavily streaked  Duration of HD Treatment -hour(s) 2.58 hour(s)  Fluid Removed (mL) -231.02 mL  Tolerated HD Treatment Yes   Received patient in bed to unit.  Alert and oriented.  Informed consent signed and in chart.   TX duration:2.6hrs due to catheter alarming  Patient tolerated well.  Transported back to the room  Alert, without acute distress.  Hand-off given to patient's nurse.   Access used: [facility] Access issues: none  Total UF removed: Medication(s) given: none    [facility]y T  [facility]

## 2022-11-10 NOTE — Plan of Care (Signed)

## 2022-11-10 NOTE — Progress Notes (Signed)
   NAME:  Corey [name], MRN:  [id number], DOB:  [birth year], LOS: 14 ADMISSION DATE:  10/27/2022, CONSULTATION DATE:  10/28/2022 REFERRING MD:  Roney Mans, CHIEF COMPLAINT:  pleural effusion.   History of Present Illness:  52 year old man who is on hemodialysis with a left pleural effusion. He has had general decline in the state of health over the last several months with loss of appetite. He had a relatively abrupt onset of left chest discomfort which she like into a sensation of trapped gas in the left side with a pleuritic component.  Mild cough and somewhat increased shortness of breath.  Some subjective fever and chills.  CT shows a moderate to large loculated left pleural effusion.  Pertinent  Medical History   Past Medical History:  Diagnosis Date   Arthritis    hands and shoulders   Blindness and low vision    "Stargardt disease"   Dissection of aorta (HCC) 2004   a. s/p extensive repeair in 2004 in [place] complicated by ESRD, lower extremity paralysis, coma, and extended hospitalization of 2 years   ESRD (end stage renal disease) (HCC)    a. TTS   Headache    History of cardioversion 2014   Hypertension    Neuropathy    Non-healing non-surgical wound 03/2016   PAF (paroxysmal atrial fibrillation) (HCC)    a. s/p DCCV in 2014; b. on Coumadin; c. CHADS2VASc => 2 (HTN, vascular disease)   Paralysis (HCC)    due to dissection of aorta in 2004, lower extremities   Pneumonia      Significant Hospital Events: Including procedures, antibiotic start and stop dates in addition to other pertinent events   7/25 - CT shows loculated left pleural effusion with associated consolidation.  No evidence of lymphadenopathy or mass. 7/28 No acute issues overnight, remains on 3L [place]. INR remains elevated at 2.5, Vitamin K given.   7/31 chest tube placed 8/3 ct chest shows tube is in the lung base with enlarging pleural effusion, first day of intrapleural fibrinolytics 8/4 second dose of  intrapleural lytics  Interim History / Subjective:  More responsive   Objective   Blood pressure (!) 87/52, pulse (!) 52, temperature 97.6 F (36.4 C), temperature source Oral, resp. rate 15, height 6\' 3"  (1.905 m), weight 63.4 kg, SpO2 94%.        Intake/Output Summary (Last 24 hours) at 11/10/2022 0925 Last data filed at 11/10/2022 0900 Gross per 24 hour  Intake 160 ml  Output 210 ml  Net -50 ml   Filed Weights   11/04/22 1518 11/07/22 0759 11/07/22 1215  Weight: 66.4 kg 65.9 kg 63.4 kg    Examination: Chronic ill-appearing male requesting chest tube out JVD or lymphadenopathy is appreciated Diminished breath sounds on the left left chest tube without airleak, chest x-ray without significant change  heart sounds are regular Extremities are warm dry   Assessment & Plan:  Parapneumonic complicated left pleural effusion; not empyema- no pus, negative cultures/ gram stain. End-stage renal disease on hemodialysis Unexplained weight loss Calciphylaxis Aortic dissection, type B, chronic & stable on imaging. P: 260 cc of drainage from chest tube during the night Continue chest tube to drainage Continue antibiotics As needed analgesics DC chest tube when chest tube drainage gets under 100 cc daily        Brett Canales  ACNP Acute Care Nurse Practitioner [facility] Please consult Amion 11/10/2022, 9:25 AM

## 2022-11-10 NOTE — Progress Notes (Signed)
Transported to HD by bed awake and alert. °

## 2022-11-10 NOTE — Progress Notes (Signed)
Back from HD by bed awake and alert. °

## 2022-11-10 NOTE — Progress Notes (Signed)
Bil. Lower extremities cleansed with soap and water,  eucerin cream applied, tolerated well.

## 2022-11-10 NOTE — Progress Notes (Signed)
Patient is alert and oriented x4. Complained of ribcage pain and received oxy throughout the night. Bilateral leg wound care performed with eucerin cream and CHG completed. Ftlat affect and denied additional needs. Chest tube output was 120 mls of serosanguineous fluid.

## 2022-11-10 NOTE — Progress Notes (Signed)
PROGRESS NOTE    Corey [name]  ZOX:[id number] DOB: [birth year] DOA: 10/27/2022 PCP: [name], NP     Brief Narrative:  Corey [name] is a 52 y.o. male with medical history significant of end-stage renal disease on hemodialysis TTS, paroxysmal atrial fibrillation on chronic Coumadin therapy, history of previous aortic dissection, peripheral vascular disease, history of osteoarthritis, history of neuropathy, who missed hemodialysis twice secondary to nausea vomiting and diarrhea.  Patient came to the ER with shortness of breath. Was found to be hypoxic with new oxygen requirement.  Also significant hyperkalemia.  Nephrology was consulted and patient is being dialyzed.  As part of his workup patient was found to have complex left-sided pleural effusion that looked loculated.  Patient underwent chest tube placement by PCCM 7/31 and several rounds of tPA.  New events last 24 hours / Subjective: No new issues overnight  Assessment & Plan:   Principal Problem:   Pleural effusion Active Problems:   ESRD on dialysis [facility])   Essential hypertension   Chronic diastolic CHF (congestive heart failure) (HCC)   Atrial fibrillation, chronic (HCC)   End-stage renal disease on hemodialysis (HCC)   AAA (abdominal aortic aneurysm) without rupture (HCC)   Hyperkalemia   PVD (peripheral vascular disease) (HCC)   Constipation   Lobar pneumonia (HCC)   Parapneumonic effusion   Need for management of chest tube   Acute respiratory failure secondary to large loculated left-sided pleural effusion/parapneumonic effusion -Status post chest tube placement by [facility] 7/31 -Pleural fluid analysis consistent with exudative parapneumonic effusion -Status post tPA -PCCM following for chest tube management, discontinue chest tube once drainage <100 cc daily -Rocephin, completed azithromycin  ESRD -HD TTS -Nephrology following  Hypotension -Status post IV albumin x 2, midodrine  Paroxysmal  A-fib -Amiodarone -Coumadin currently on hold, ??Resume once okay with PCCM  Chronic diastolic CHF -Volume management with HD  PVD -Outpatient follow-up  Constipation -Bowel regimen   DVT prophylaxis: Coumadin PTA  Code Status: Full Family Communication: None at bedside  Disposition Plan: Home Status is: Inpatient Remains inpatient appropriate because: Chest tube in place    Antimicrobials:  Anti-infectives (From admission, onward)    Start     Dose/Rate Route Frequency Ordered Stop   11/08/22 1800  cefTRIAXone (ROCEPHIN) 2 g in sodium chloride 0.9 % 100 mL IVPB        2 g 200 mL/hr over 30 Minutes Intravenous Every 24 hours 11/08/22 1656 11/13/22 1759   11/03/22 0915  cefTRIAXone (ROCEPHIN) 2 g in sodium chloride 0.9 % 100 mL IVPB        2 g 200 mL/hr over 30 Minutes Intravenous Every 24 hours 11/03/22 0823 11/08/22 1701   11/03/22 0915  azithromycin (ZITHROMAX) 500 mg in sodium chloride 0.9 % 250 mL IVPB        500 mg 250 mL/hr over 60 Minutes Intravenous Every 24 hours 11/03/22 0823 11/08/22 1701        Objective: Vitals:   11/10/22 0620 11/10/22 0730 11/10/22 1101 11/10/22 1200  BP: (!) 87/52  (!) 105/56   Pulse: (!) 52  (!) 56   Resp: 15  16   Temp: 97.6 F (36.4 C)  97.7 F (36.5 C)   TempSrc: Oral  Oral   SpO2: 99% 94% 92% 92%  Weight:      Height:        Intake/Output Summary (Last 24 hours) at 11/10/2022 1209 Last data filed at 11/10/2022 0900 Gross per 24 hour  Intake 150 ml  Output 190 ml  Net -40 ml   Filed Weights   11/04/22 1518 11/07/22 0759 11/07/22 1215  Weight: 66.4 kg 65.9 kg 63.4 kg    Examination:  General exam: Appears calm and comfortable  Respiratory system: Clear to auscultation. Respiratory effort normal. +CT in place  Cardiovascular system: S1 & S2 heard, RRR. No murmurs. No pedal edema. Gastrointestinal system: Abdomen is nondistended, soft Central nervous system: Alert a Extremities: Symmetric in appearance  Skin: No  rashes, lesions or ulcers on exposed skin  Psychiatry: Judgement and insight appear normal.   Data Reviewed: I have personally reviewed following labs and imaging studies  CBC: Recent Labs  Lab 11/04/22 0122 11/05/22 0058 11/06/22 0138 11/07/22 0048 11/08/22 0332 11/09/22 0221  WBC 13.3* 14.3* 13.1* 11.5* 10.7* 10.1  NEUTROABS 11.3* 12.4* 11.2* 9.4*  --   --   HGB 9.6* 9.6* 9.6* 10.1* 10.2* 9.8*  HCT 31.9* 31.5* 32.5* 33.8* 34.5* 33.5*  MCV 97.6 96.3 96.4 98.0 98.6 97.7  PLT 204 192 193 233 187 169   Basic Metabolic Panel: Recent Labs  Lab 11/05/22 0058 11/06/22 0138 11/07/22 0048 11/08/22 0332 11/09/22 0221  NA 136 137 138 136 135  K 3.7 4.0 4.4 4.0 4.0  CL 97* 99 98 96* 97*  CO2 26 24 24 27 25   GLUCOSE 112* 96 100* 88 87  BUN 26* 38* 45* 23* 31*  CREATININE 5.09* 6.37* 7.36* 4.82* 5.52*  CALCIUM 6.8* 7.1* 7.5* 7.7* 7.8*  PHOS 2.5 3.3 4.0 3.8 4.3   GFR: Estimated Creatinine Clearance: 14 mL/min (A) (by C-G formula based on SCr of 5.52 mg/dL (H)). Liver Function Tests: Recent Labs  Lab 11/05/22 0058 11/06/22 0138 11/07/22 0048 11/08/22 0332 11/09/22 0221  ALBUMIN 2.8* 2.7* 2.6* 2.5* 2.4*   No results for input(s): "LIPASE", "AMYLASE" in the last 168 hours. No results for input(s): "AMMONIA" in the last 168 hours. Coagulation Profile: Recent Labs  Lab 11/05/22 1243 11/08/22 0332  INR 1.5* 2.2*   Cardiac Enzymes: No results for input(s): "CKTOTAL", "CKMB", "CKMBINDEX", "TROPONINI" in the last 168 hours. BNP (last 3 results) No results for input(s): "PROBNP" in the last 8760 hours. HbA1C: No results for input(s): "HGBA1C" in the last 72 hours. CBG: No results for input(s): "GLUCAP" in the last 168 hours. Lipid Profile: No results for input(s): "CHOL", "HDL", "LDLCALC", "TRIG", "CHOLHDL", "LDLDIRECT" in the last 72 hours. Thyroid Function Tests: No results for input(s): "TSH", "T4TOTAL", "FREET4", "T3FREE", "THYROIDAB" in the last 72 hours. Anemia  Panel: No results for input(s): "VITAMINB12", "FOLATE", "FERRITIN", "TIBC", "IRON", "RETICCTPCT" in the last 72 hours. Sepsis Labs: No results for input(s): "PROCALCITON", "LATICACIDVEN" in the last 168 hours.  Recent Results (from the past 240 hour(s))  Body fluid culture w Gram Stain     Status: None   Collection Time: 11/02/22  4:44 PM   Specimen: Pleural Fluid  Result Value Ref Range Status   Specimen Description FLUID PLEURAL  Final   Special Requests NONE  Final   Gram Stain   Final    RARE WBC PRESENT, PREDOMINANTLY PMN NO ORGANISMS SEEN    Culture   Final    NO GROWTH 3 DAYS Performed at [facility], [address] [place], [place] [address]    Report Status 11/06/2022 FINAL  Final  Fungus Culture With Stain     Status: None (Preliminary result)   Collection Time: 11/02/22  4:44 PM   Specimen: Pleural Fluid  Result  Value Ref Range Status   Fungus Stain Final report  Final    Comment: (NOTE) Performed At: [facility] [address] [place], [place] [id number] Jolene Schimke MD IO:[id number]    Fungus (Mycology) Culture PENDING  Incomplete   Fungal Source FLUID  Final    Comment: PLEURAL Performed at [facility], [address], [place], [place] [address]   Acid Fast Smear (AFB)     Status: None   Collection Time: 11/02/22  4:44 PM   Specimen: Pleural, Left; Pleural Fluid  Result Value Ref Range Status   AFB Specimen Processing Concentration  Final   Acid Fast Smear Negative  Final    Comment: (NOTE) Performed At: [facility] [address] [place], [place] [id number] Jolene Schimke MD QI:[id number]    Source (AFB) FLUID  Final    Comment: PLEURAL Performed at [facility], [address] [place], [place] [address]   Fungus Culture Result     Status: None   Collection Time: 11/02/22  4:44 PM  Result Value Ref Range Status   Result 1 Comment  Final    Comment: (NOTE) KOH/Calcofluor preparation:  no fungus  observed. Performed At: [facility] [address] [place], [place] [id number] Jolene Schimke MD ZY:[id number]   Culture, blood (routine x 2) Call MD if unable to obtain prior to antibiotics being given     Status: None   Collection Time: 11/03/22  8:49 AM   Specimen: BLOOD LEFT ARM  Result Value Ref Range Status   Specimen Description BLOOD LEFT ARM  Final   Special Requests   Final    BOTTLES DRAWN AEROBIC AND ANAEROBIC Blood Culture adequate volume   Culture   Final    NO GROWTH 5 DAYS Performed at [facility], [address] [place], [place] [address]    Report Status 11/08/2022 FINAL  Final  Culture, blood (routine x 2) Call MD if unable to obtain prior to antibiotics being given     Status: None   Collection Time: 11/03/22  8:55 AM   Specimen: BLOOD LEFT ARM  Result Value Ref Range Status   Specimen Description BLOOD LEFT ARM  Final   Special Requests   Final    BOTTLES DRAWN AEROBIC AND ANAEROBIC Blood Culture adequate volume   Culture   Final    NO GROWTH 5 DAYS Performed at [facility], [address], [place], [place] [address]    Report Status 11/08/2022 FINAL  Final      Radiology Studies: DG CHEST PORT 1 VIEW  Result Date: 11/10/2022 CLINICAL DATA:  Chest tube. EXAM: PORTABLE CHEST 1 VIEW COMPARISON:  November 08, 2022. FINDINGS: Stable cardiomegaly. Right internal jugular dialysis catheter is unchanged. Stable diffuse reticular opacities are noted concerning for pulmonary edema with bibasilar atelectasis or infiltrates. Stable left-sided pleural drainage catheter without definite pneumothorax. Minimal left pleural effusion may be present. Bony thorax is unremarkable. IMPRESSION: Stable bilateral lung opacities as noted above. Stable position of left-sided pleural drainage catheter without definite pneumothorax. Electronically Signed   By: Lupita Raider M.D.   On: 11/10/2022 08:53      Scheduled Meds:  (feeding supplement) PROSource Plus  30  mL Oral BID BM   amiodarone  200 mg Oral Daily   Chlorhexidine Gluconate Cloth  6 each Topical Q0600   Chlorhexidine Gluconate Cloth  6 each Topical Q0600   darbepoetin (ARANESP) injection - DIALYSIS  40 mcg Subcutaneous Q  Sat-1800   hydrocerin   Topical Daily   lidocaine  3 patch Transdermal Q24H   midodrine  15 mg Oral TID WC   polyethylene glycol  17 g Oral BID   senna-docusate  1 tablet Oral BID   sevelamer carbonate  4.8 g Oral TID WC   sodium chloride flush  10 mL Intrapleural Q8H   sodium chloride flush  10 mL Intrapleural Q8H   Continuous Infusions:  anticoagulant sodium citrate 100 mL/hr at 11/08/22 1735   cefTRIAXone (ROCEPHIN)  IV 2 g (11/09/22 1734)     LOS: 14 days   Time spent: 25 minutes   Noralee Stain, DO [facility] 11/10/2022, 12:09 PM   Available via Epic secure chat 7am-7pm After these hours, please refer to coverage provider listed on [email or web address]

## 2022-11-10 NOTE — Progress Notes (Signed)
  [facility] Progress Note   Subjective:  Seen in room, no c/o.   Objective Vitals:   11/10/22 1400 11/10/22 1443 11/10/22 1500 11/10/22 1530  BP: 98/62 (!) 90/54 (!) 96/54 (!) 93/58  Pulse: (!) 56 (!) 56 (!) 55 69  Resp: 13 13 13 18   Temp: 97.7 F (36.5 C)     TempSrc: Oral     SpO2: 100% 100% 100% 91%  Weight: 61.4 kg     Height:       Physical Exam General: Chronically ill appearing man, NAD. Nasal O2 in place Heart: RRR; no murmur Lungs: L chest tube in place, clear anteriorly Abdomen: soft Extremities: chronic woody changes without  edema +cachectic Dialysis Access: TDC  c/d/i  OP HD: TTS [facility] 4h  2/2 bath  66kg   TDC   no heparin (allergy)  lock TDC w/ citrate - No ESA or VDRA - Binder: Renvela pwdr 4.8g TID - Sensipar 180mg  every day at home   Assessment/Plan: Acute hypoxic respiratory failure: In setting of missed HD. CXR with pleural effusion s/p chest tube 7/31. LE now resolved. Loculated L pleural effusion: s/p thoracentesis with cultures.  Afebrile.  Chest tube remains in. Had TPA infused 8/3. Per pulm. ESRD: usual TTS schedule, but is off schedule. Had HD Monday here. Next HD tomorrow.  Hypotension/ volume: BP low, LE edema resolved.  Is on midodrine. 4kg under dry wt, euvolemic now. 0-1 UF goal today.  Anemia of ESRD:  Hgb 9s - s/p Aranesp on 7/27.  Hb stable  Secondary HPTH: Ca/Phos ok - continue home binders.  Held cinacalcet due to hypoCa - consider resuming at lower dose (was 180 daily) if Ca improves. Nutrition: Alb low, continue supplements. A-fib: On amiodarone + warfarin (on hold for chest tube), has IVC filter Hx type B aortic dissection s/p repair (2004)  Paralysis - due to aortic dissection in 2004    Rob   MD  [facility] 11/10/2022, 3:45 PM  Recent Labs  Lab 11/08/22 0332 11/09/22 0221  HGB 10.2* 9.8*  ALBUMIN 2.5* 2.4*  CALCIUM 7.7* 7.8*  PHOS 3.8 4.3  CREATININE 4.82* 5.52*  K 4.0 4.0    Inpatient  medications:  (feeding supplement) PROSource Plus  30 mL Oral BID BM   amiodarone  200 mg Oral Daily   Chlorhexidine Gluconate Cloth  6 each Topical Q0600   Chlorhexidine Gluconate Cloth  6 each Topical Q0600   darbepoetin (ARANESP) injection - DIALYSIS  40 mcg Subcutaneous Q Sat-1800   hydrocerin   Topical Daily   lidocaine  3 patch Transdermal Q24H   midodrine  15 mg Oral TID WC   polyethylene glycol  17 g Oral BID   senna-docusate  1 tablet Oral BID   sevelamer carbonate  4.8 g Oral TID WC   sodium chloride flush  10 mL Intrapleural Q8H   sodium chloride flush  10 mL Intrapleural Q8H    anticoagulant sodium citrate 100 mL/hr at 11/08/22 1735   cefTRIAXone (ROCEPHIN)  IV 2 g (11/09/22 1734)   acetaminophen, anticoagulant sodium citrate, calcium carbonate (dosed in mg elemental calcium), camphor-menthol **AND** hydrOXYzine, docusate sodium, HYDROmorphone (DILAUDID) injection, ondansetron **OR** ondansetron (ZOFRAN) IV, oxyCODONE, sorbitol, zolpidem

## 2022-11-11 ENCOUNTER — Inpatient Hospital Stay (HOSPITAL_COMMUNITY): Payer: 59

## 2022-11-11 ENCOUNTER — Ambulatory Visit: Payer: 59 | Admitting: Cardiology

## 2022-11-11 DIAGNOSIS — J90 Pleural effusion, not elsewhere classified: Secondary | ICD-10-CM | POA: Diagnosis not present

## 2022-11-11 LAB — PROTIME-INR
INR: 1.2 (ref 0.8–1.2)
Prothrombin Time: 15.7 seconds — ABNORMAL HIGH (ref 11.4–15.2)

## 2022-11-11 MED ORDER — CHLORHEXIDINE GLUCONATE CLOTH 2 % EX PADS
6.0000 | MEDICATED_PAD | Freq: Every day | CUTANEOUS | Status: DC
  Administered 2022-11-07 – 2022-11-25 (×16): 6 via TOPICAL

## 2022-11-11 MED ORDER — AMOXICILLIN-POT CLAVULANATE 500-125 MG PO TABS
1.0000 | ORAL_TABLET | Freq: Every day | ORAL | Status: AC
  Administered 2022-11-11 – 2022-11-16 (×6): 1 via ORAL
  Filled 2022-11-11 (×6): qty 1

## 2022-11-11 MED ORDER — WARFARIN - PHARMACIST DOSING INPATIENT: Freq: Every day | Status: DC

## 2022-11-11 MED ORDER — WARFARIN SODIUM 2 MG PO TABS
2.0000 mg | ORAL_TABLET | Freq: Once | ORAL | Status: AC
  Administered 2022-11-11: 2 mg via ORAL
  Filled 2022-11-11: qty 1

## 2022-11-11 NOTE — Progress Notes (Signed)
Mobility Specialist Progress Note:   11/11/22 1100  Mobility  Activity Transferred from chair to bed  Level of Assistance +2 (takes two people)  Assistive Device None  Activity Response Tolerated well  Mobility Referral Yes  $Mobility charge 1 Mobility  Mobility Specialist Start Time (ACUTE ONLY) 1047  Mobility Specialist Stop Time (ACUTE ONLY) 1102  Mobility Specialist Time Calculation (min) (ACUTE ONLY) 15 min   Pt received in chair, agreeable to get back to bed at RN request. ModA+2 to lift/slide back to bed. C/o of existing pain all over, otherswise asymptomatic. Pt left in bed with call bell and bed alarm on.   D'Vante Earlene Plater Mobility Specialist Please contact via Special educational needs teacher or Rehab office at [phone]

## 2022-11-11 NOTE — Progress Notes (Signed)
  Corey [name] Progress Note   Subjective:  Seen in room, no c/o.   Objective Vitals:   11/11/22 0338 11/11/22 0830 11/11/22 1255 11/11/22 1500  BP: (!) 93/50 102/68 116/67 112/63  Pulse: 67 70  62  Resp: 20 19 (!) 21 (!) 22  Temp: 98.3 F (36.8 C) 98.2 F (36.8 C) 97.9 F (36.6 C) 97.7 F (36.5 C)  TempSrc: Oral Oral Oral Oral  SpO2: 99% 98%  90%  Weight:      Height:       Physical Exam General: Chronically ill appearing man, NAD. Nasal O2 in place Heart: RRR; no murmur Lungs: L chest tube in place, clear anteriorly Abdomen: soft Extremities: chronic woody changes without  edema +cachectic Dialysis Access: TDC  c/d/i  OP HD: TTS [facility] 4h  2/2 bath  66kg   TDC   no heparin (allergy)  lock TDC w/ citrate - No ESA or VDRA - Binder: Renvela pwdr 4.8g TID - Sensipar 180mg  every day at home   Assessment/Plan: Acute hypoxic respiratory failure: In setting of missed HD. CXR with pleural effusion s/p chest tube 7/31. LE now resolved. Loculated L parapneumonic pleural effusion: s/p thoracentesis with cultures.  Afebrile.  Chest tube remains in. Had TPA infused 8/3. Per pulm. ESRD: usual TTS schedule. HD tomorrow.  Heparin allergy - use citrate for cath lock Hypotension/ volume: BP low, LE edema resolved.  Is on midodrine. 4kg under dry wt, euvolemic now. 0-1 UF goal today.  Anemia of ESRD:  Hgb 9s - s/p Aranesp on 7/27.  Hb stable  Secondary HPTH: Ca/Phos ok - continue home binders.  Held cinacalcet due to hypoCa - consider resuming at lower dose (was 180 daily) if Ca improves. Nutrition: Alb low, continue supplements. A-fib: On amiodarone + warfarin (on hold for chest tube), has IVC filter Hx type B aortic dissection s/p repair (2004)  Paralysis - due to aortic dissection in 2004    Rob   MD  [facility] 11/11/2022, 4:15 PM  Recent Labs  Lab 11/08/22 0332 11/09/22 0221 11/11/22 0008  HGB 10.2* 9.8* 10.9*  ALBUMIN 2.5* 2.4*  --   CALCIUM 7.7* 7.8*  8.2*  PHOS 3.8 4.3  --   CREATININE 4.82* 5.52* 4.76*  K 4.0 4.0 3.8    Inpatient medications:  (feeding supplement) PROSource Plus  30 mL Oral BID BM   amiodarone  200 mg Oral Daily   amoxicillin-clavulanate  1 tablet Oral QHS   Chlorhexidine Gluconate Cloth  6 each Topical Q0600   darbepoetin (ARANESP) injection - DIALYSIS  40 mcg Subcutaneous Q Sat-1800   hydrocerin   Topical Daily   lidocaine  3 patch Transdermal Q24H   midodrine  15 mg Oral TID WC   polyethylene glycol  17 g Oral BID   senna-docusate  1 tablet Oral BID   sevelamer carbonate  4.8 g Oral TID WC   sodium chloride flush  10 mL Intrapleural Q8H   sodium chloride flush  10 mL Intrapleural Q8H   Warfarin - Pharmacist Dosing Inpatient   Does not apply q1600    anticoagulant sodium citrate 100 mL/hr at 11/08/22 1735   acetaminophen, anticoagulant sodium citrate, calcium carbonate (dosed in mg elemental calcium), camphor-menthol **AND** hydrOXYzine, docusate sodium, HYDROmorphone (DILAUDID) injection, ondansetron **OR** ondansetron (ZOFRAN) IV, oxyCODONE, sorbitol, zolpidem

## 2022-11-11 NOTE — Progress Notes (Addendum)
NAME:  Corey [name], MRN:  [id number], DOB:  Dec 22, 1970, LOS: 15 ADMISSION DATE:  10/27/2022, CONSULTATION DATE:  10/28/2022 REFERRING MD:  Roney Mans, CHIEF COMPLAINT:  pleural effusion.   History of Present Illness:  52 year old man who is on hemodialysis with a left pleural effusion. He has had general decline in the state of health over the last several months with loss of appetite. He had a relatively abrupt onset of left chest discomfort which she like into a sensation of trapped gas in the left side with a pleuritic component.  Mild cough and somewhat increased shortness of breath.  Some subjective fever and chills.  CT shows a moderate to large loculated left pleural effusion.  Pertinent  Medical History   Past Medical History:  Diagnosis Date   Arthritis    hands and shoulders   Blindness and low vision    "Stargardt disease"   Dissection of aorta (HCC) 2004   a. s/p extensive repeair in 2004 in [place] complicated by ESRD, lower extremity paralysis, coma, and extended hospitalization of 2 years   ESRD (end stage renal disease) (HCC)    a. TTS   Headache    History of cardioversion 2014   Hypertension    Neuropathy    Non-healing non-surgical wound 03/2016   PAF (paroxysmal atrial fibrillation) (HCC)    a. s/p DCCV in 2014; b. on Coumadin; c. CHADS2VASc => 2 (HTN, vascular disease)   Paralysis (HCC)    due to dissection of aorta in 2004, lower extremities   Pneumonia      Significant Hospital Events: Including procedures, antibiotic start and stop dates in addition to other pertinent events   7/25 - CT shows loculated left pleural effusion with associated consolidation.  No evidence of lymphadenopathy or mass. 7/28 No acute issues overnight, remains on 3L [place]. INR remains elevated at 2.5, Vitamin K given.   7/31 chest tube placed 8/3 ct chest shows tube is in the lung base with enlarging pleural effusion, first day of intrapleural fibrinolytics 8/4 second dose of  intrapleural lytics 8/5 third dose of lytics 8/8 CT Chest with left small residual loculated effusion  Interim History / Subjective:   Chest tube ouput 80 cc  Objective   Blood pressure (!) 93/50, pulse 67, temperature 98.3 F (36.8 C), temperature source Oral, resp. rate 20, height 6\' 3"  (1.905 m), weight 61.4 kg, SpO2 99%.        Intake/Output Summary (Last 24 hours) at 11/11/2022 0908 Last data filed at 11/11/2022 0831 Gross per 24 hour  Intake 70 ml  Output -151.02 ml  Net 221.02 ml   Filed Weights   11/07/22 0759 11/07/22 1215 11/10/22 1400  Weight: 65.9 kg 63.4 kg 61.4 kg   Physical Exam: General: Chronically ill-appearing, no acute distress HENT: [place], AT Eyes: EOMI, no scleral icterus, visual impairment Respiratory: Clear to auscultation bilaterally.  No crackles, wheezing or rales. Left chest tube in place Cardiovascular: RRR, -M/R/G, no JVD Extremities:-Edema,-tenderness Neuro: AAO x4, CNII-XII grossly intact Psych: Normal mood, normal affect   Assessment & Plan:  Parapneumonic complicated left pleural effusion - empyema ruled out Acute hypoxemic respiratory failure ESRD with calciphylaxis Unexplained weight loss Paralysis 2/2 aortic dissection in 2004 P: S/p lytics 8/2-8/5 Last 24 hours output: 80cc Remove chest tube today. Will order CXR afterwards Continue antibiotics for total 14 day course. On day #9. End date 8/14. OK to transition to PO augmentin when tolerated As needed analgesics  Pulmonary available as needed.  Care Time: 55 min  Mechele Collin, M.D. [facility] Pulmonary/Critical Care Medicine 11/11/2022 9:08 AM   See Amion for personal pager For hours between 7 PM to 7 AM, please call Elink for urgent questions

## 2022-11-11 NOTE — Evaluation (Signed)
Physical Therapy Evaluation Patient Details Name: Corey [name] MRN: [id number] DOB: [birth year] Today's Date: 11/11/2022  History of Present Illness  52 year old man admitted 7/25 with left pleural effusion.  He has had general decline in the state of health over the last several months with loss of appetite. Pt with large left loculated pleural effusion with CT placed 7/31.  tPa 8/3.  PMH:  ESRD, T,Th, Sat HD; paralysis bil LEs after aortic dissection, blindness  Clinical Impression  Pt admitted with above diagnosis. Pt was able to transfer to the drop arm recliner with min assist, +2 for safety with pt doing a lot of the transfer on his own. Pt reports he lived with brother and they help each other.  Both use wheelchairs.  Pt was Modif I PTA.  Feel that pt would benefit from post acute Rehab > 3 hours day to return to prior level of function.   Pt currently with functional limitations due to the deficits listed below (see PT Problem List). Pt will benefit from acute skilled PT to increase their independence and safety with mobility to allow discharge.           If plan is discharge home, recommend the following: Assistance with cooking/housework;Assist for transportation;Help with stairs or ramp for entrance   Can travel by private vehicle        Equipment Recommendations None recommended by PT  Recommendations for Other Services       Functional Status Assessment Patient has had a recent decline in their functional status and demonstrates the ability to make significant improvements in function in a reasonable and predictable amount of time.     Precautions / Restrictions Precautions Precautions: Fall Precaution Comments: CT in place Restrictions Weight Bearing Restrictions: No      Mobility  Bed Mobility Overal bed mobility: Needs Assistance Bed Mobility: Rolling, Supine to Sit Rolling: Contact guard assist   Supine to sit: Contact guard     General bed mobility comments:  With cues and rail, pt to EOB on his own. Moves slowly but was able to come to EOB.    Transfers Overall transfer level: Needs assistance Equipment used: None Transfers: Bed to chair/wheelchair/BSC            Lateral/Scoot Transfers: Min assist, From elevated surface, +2 safety/equipment General transfer comment: Pt able to scoot to drop arm recliner to his right with min guard most of time with 2 boosts with use of pad to assist to initiate movement. Pt is able to boost buttocks up enough to perform scoots.  Pt fatigued once in chair.    Ambulation/Gait                  Stairs            Wheelchair Mobility     Tilt Bed    Modified Rankin (Stroke Patients Only)       Balance                                             Pertinent Vitals/Pain      Home Living Family/patient expects to be discharged to:: Private residence Living Arrangements: Other relatives Available Help at Discharge: Family;Available PRN/intermittently (Brother but cannot provide much assist) Type of Home: House Home Access: Ramped entrance       Home Layout: One level;Full bath on main  level Home Equipment: BSC/3in1;Wheelchair - manual      Prior Function Prior Level of Function : Needs assist             Mobility Comments: uses a wheelchair for mobility ADLs Comments: sponge bathes the best he can, brother typically does higher level ADLs     Extremity/Trunk Assessment   Upper Extremity Assessment Upper Extremity Assessment: Defer to OT evaluation    Lower Extremity Assessment Lower Extremity Assessment: RLE deficits/detail;LLE deficits/detail RLE Deficits / Details: Hip and knee 2/5, ankle 2+/5, Slight flexion contracture to 10 degrees flexion when pt supine RLE: Unable to fully assess due to pain LLE Deficits / Details: Hip and knee 2/5, ankle 2+/5,  Slight flexion contracture to 10 degrees flexion when pt supine LLE: Unable to fully assess  due to pain    Cervical / Trunk Assessment Cervical / Trunk Assessment: Kyphotic  Communication   Communication Communication: No apparent difficulties  Cognition     Overall Cognitive Status: Within Functional Limits for tasks assessed                                          General Comments General comments (skin integrity, edema, etc.): Bil feet with skin "leathery" with pt c/o pain.  Stiffness noted in feet and legs. VSS with pt on 4LO2 with sats 98%.  States he wore O2 as needed at home    Exercises     Assessment/Plan    PT Assessment Patient needs continued PT services  PT Problem List Decreased strength;Decreased range of motion;Decreased activity tolerance;Decreased balance;Decreased mobility;Decreased knowledge of use of DME;Decreased safety awareness;Decreased knowledge of precautions;Pain;Decreased skin integrity       PT Treatment Interventions DME instruction;Functional mobility training;Therapeutic activities;Therapeutic exercise;Balance training;Patient/family education;Wheelchair mobility training    PT Goals (Current goals can be found in the Care Plan section)  Acute Rehab PT Goals Patient Stated Goal: to go home after rehab PT Goal Formulation: With patient Time For Goal Achievement: 11/25/22 Potential to Achieve Goals: Good    Frequency Min 1X/week     Co-evaluation               AM-PAC PT "6 Clicks" Mobility  Outcome Measure Help needed turning from your back to your side while in a flat bed without using bedrails?: None Help needed moving from lying on your back to sitting on the side of a flat bed without using bedrails?: A Little Help needed moving to and from a bed to a chair (including a wheelchair)?: A Little Help needed standing up from a chair using your arms (e.g., wheelchair or bedside chair)?: Total Help needed to walk in hospital room?: Total Help needed climbing 3-5 steps with a railing? : Total 6 Click  Score: 13    End of Session Equipment Utilized During Treatment: Gait belt;Oxygen Activity Tolerance: Patient limited by fatigue Patient left: in chair;with call bell/phone within reach;with chair alarm set Nurse Communication: Mobility status PT Visit Diagnosis: Muscle weakness (generalized) (M62.81);Pain Pain - Right/Left:  (bil) Pain - part of body: Ankle and joints of foot;Leg    Time: 2952-8413 PT Time Calculation (min) (ACUTE ONLY): 25 min   Charges:   PT Evaluation $PT Eval Moderate Complexity: 1 Mod   PT General Charges $$ ACUTE PT VISIT: 1 Visit          M,PT Acute Rehab Services [phone]  Bevelyn Buckles 11/11/2022, 10:17 AM

## 2022-11-11 NOTE — Progress Notes (Signed)
PROGRESS NOTE    Corey [name]  LOV:[id number] DOB: March 06, 1971 DOA: 10/27/2022 PCP: [name], NP     Brief Narrative:  Corey [name] is a 52 y.o. male with medical history significant of end-stage renal disease on hemodialysis TTS, paroxysmal atrial fibrillation on chronic Coumadin therapy, history of previous aortic dissection, peripheral vascular disease, history of osteoarthritis, history of neuropathy, who missed hemodialysis twice secondary to nausea vomiting and diarrhea.  Patient came to the ER with shortness of breath. Was found to be hypoxic with new oxygen requirement.  Also significant hyperkalemia.  Nephrology was consulted and patient is being dialyzed.  As part of his workup patient was found to have complex left-sided pleural effusion that looked loculated.  Patient underwent chest tube placement by PCCM 7/31 and several rounds of tPA.  New events last 24 hours / Subjective: Complains of some SOB at rest.   Assessment & Plan:   Principal Problem:   Pleural effusion Active Problems:   ESRD on dialysis [facility])   Essential hypertension   Chronic diastolic CHF (congestive heart failure) (HCC)   Atrial fibrillation, chronic (HCC)   End-stage renal disease on hemodialysis (HCC)   AAA (abdominal aortic aneurysm) without rupture (HCC)   Hyperkalemia   PVD (peripheral vascular disease) (HCC)   Constipation   Lobar pneumonia (HCC)   Parapneumonic effusion   Need for management of chest tube   Acute respiratory failure secondary to large loculated left-sided pleural effusion/parapneumonic effusion -Status post chest tube placement by [facility] 7/31 -Pleural fluid analysis consistent with exudative parapneumonic effusion -Status post tPA -PCCM following for chest tube management, planning to remove chest tube with repeat CXR today  -Rocephin, completed azithromycin  ESRD -HD TTS -Nephrology following  Hypotension -Status post IV albumin x 2, midodrine  Paroxysmal  A-fib -Amiodarone -Resume coumadin tonight   Chronic diastolic CHF -Volume management with HD  PVD -Outpatient follow-up  Constipation -Bowel regimen  Weakness -PT OT recommending CIR    DVT prophylaxis: Coumadin  Code Status: Full Family Communication: None at bedside  Disposition Plan: Home Status is: Inpatient Remains inpatient appropriate because: Chest tube removal today, CIR recommended    Antimicrobials:  Anti-infectives (From admission, onward)    Start     Dose/Rate Route Frequency Ordered Stop   11/08/22 1800  cefTRIAXone (ROCEPHIN) 2 g in sodium chloride 0.9 % 100 mL IVPB        2 g 200 mL/hr over 30 Minutes Intravenous Every 24 hours 11/08/22 1656 11/13/22 1759   11/03/22 0915  cefTRIAXone (ROCEPHIN) 2 g in sodium chloride 0.9 % 100 mL IVPB        2 g 200 mL/hr over 30 Minutes Intravenous Every 24 hours 11/03/22 0823 11/08/22 1701   11/03/22 0915  azithromycin (ZITHROMAX) 500 mg in sodium chloride 0.9 % 250 mL IVPB        500 mg 250 mL/hr over 60 Minutes Intravenous Every 24 hours 11/03/22 0823 11/08/22 1701        Objective: Vitals:   11/10/22 1900 11/11/22 0013 11/11/22 0338 11/11/22 0830  BP: 96/79 (!) 96/59 (!) 93/50 102/68  Pulse: (!) 58 60 67 70  Resp: 16 17 20 19   Temp: 97.6 F (36.4 C) 98.3 F (36.8 C) 98.3 F (36.8 C) 98.2 F (36.8 C)  TempSrc: Oral Oral Oral Oral  SpO2: 93% 100% 99% 98%  Weight:      Height:        Intake/Output Summary (Last 24 hours) at  11/11/2022 1210 Last data filed at 11/11/2022 1126 Gross per 24 hour  Intake 10 ml  Output -51.02 ml  Net 61.02 ml   Filed Weights   11/07/22 0759 11/07/22 1215 11/10/22 1400  Weight: 65.9 kg 63.4 kg 61.4 kg    Examination:  General exam: Appears calm and comfortable  Respiratory system: Clear to auscultation. Respiratory effort normal. +CT in place  Cardiovascular system: S1 & S2 heard, RRR. No murmurs. No pedal edema. Gastrointestinal system: Abdomen is nondistended,  soft Central nervous system: Alert  Extremities: Symmetric in appearance  Skin: No rashes, lesions or ulcers on exposed skin  Psychiatry: Judgement and insight appear normal.   Data Reviewed: I have personally reviewed following labs and imaging studies  CBC: Recent Labs  Lab 11/05/22 0058 11/06/22 0138 11/07/22 0048 11/08/22 0332 11/09/22 0221 11/11/22 0008  WBC 14.3* 13.1* 11.5* 10.7* 10.1 9.2  NEUTROABS 12.4* 11.2* 9.4*  --   --   --   HGB 9.6* 9.6* 10.1* 10.2* 9.8* 10.9*  HCT 31.5* 32.5* 33.8* 34.5* 33.5* 37.5*  MCV 96.3 96.4 98.0 98.6 97.7 96.6  PLT 192 193 233 187 169 143*   Basic Metabolic Panel: Recent Labs  Lab 11/05/22 0058 11/06/22 0138 11/07/22 0048 11/08/22 0332 11/09/22 0221 11/11/22 0008  NA 136 137 138 136 135 135  K 3.7 4.0 4.4 4.0 4.0 3.8  CL 97* 99 98 96* 97* 93*  CO2 26 24 24 27 25 26   GLUCOSE 112* 96 100* 88 87 91  BUN 26* 38* 45* 23* 31* 28*  CREATININE 5.09* 6.37* 7.36* 4.82* 5.52* 4.76*  CALCIUM 6.8* 7.1* 7.5* 7.7* 7.8* 8.2*  PHOS 2.5 3.3 4.0 3.8 4.3  --    GFR: Estimated Creatinine Clearance: 15.8 mL/min (A) (by C-G formula based on SCr of 4.76 mg/dL (H)). Liver Function Tests: Recent Labs  Lab 11/05/22 0058 11/06/22 0138 11/07/22 0048 11/08/22 0332 11/09/22 0221  ALBUMIN 2.8* 2.7* 2.6* 2.5* 2.4*   No results for input(s): "LIPASE", "AMYLASE" in the last 168 hours. No results for input(s): "AMMONIA" in the last 168 hours. Coagulation Profile: Recent Labs  Lab 11/05/22 1243 11/08/22 0332 11/11/22 1120  INR 1.5* 2.2* 1.2   Cardiac Enzymes: No results for input(s): "CKTOTAL", "CKMB", "CKMBINDEX", "TROPONINI" in the last 168 hours. BNP (last 3 results) No results for input(s): "PROBNP" in the last 8760 hours. HbA1C: No results for input(s): "HGBA1C" in the last 72 hours. CBG: No results for input(s): "GLUCAP" in the last 168 hours. Lipid Profile: No results for input(s): "CHOL", "HDL", "LDLCALC", "TRIG", "CHOLHDL",  "LDLDIRECT" in the last 72 hours. Thyroid Function Tests: No results for input(s): "TSH", "T4TOTAL", "FREET4", "T3FREE", "THYROIDAB" in the last 72 hours. Anemia Panel: No results for input(s): "VITAMINB12", "FOLATE", "FERRITIN", "TIBC", "IRON", "RETICCTPCT" in the last 72 hours. Sepsis Labs: No results for input(s): "PROCALCITON", "LATICACIDVEN" in the last 168 hours.  Recent Results (from the past 240 hour(s))  Body fluid culture w Gram Stain     Status: None   Collection Time: 11/02/22  4:44 PM   Specimen: Pleural Fluid  Result Value Ref Range Status   Specimen Description FLUID PLEURAL  Final   Special Requests NONE  Final   Gram Stain   Final    RARE WBC PRESENT, PREDOMINANTLY PMN NO ORGANISMS SEEN    Culture   Final    NO GROWTH 3 DAYS Performed at [facility], [address] [place], [place] [address]    Report Status 11/06/2022  FINAL  Final  Fungus Culture With Stain     Status: None (Preliminary result)   Collection Time: 11/02/22  4:44 PM   Specimen: Pleural Fluid  Result Value Ref Range Status   Fungus Stain Final report  Final    Comment: (NOTE) Performed At: [facility] [address] [place], [place] [id number] Jolene Schimke MD [id number]    Fungus (Mycology) Culture PENDING  Incomplete   Fungal Source FLUID  Final    Comment: PLEURAL Performed at [facility], [address], [place], [place] [address]   Acid Fast Smear (AFB)     Status: None   Collection Time: 11/02/22  4:44 PM   Specimen: Pleural, Left; Pleural Fluid  Result Value Ref Range Status   AFB Specimen Processing Concentration  Final   Acid Fast Smear Negative  Final    Comment: (NOTE) Performed At: [facility] [address] [place], [place] [id number] Jolene Schimke MD [id number]    Source (AFB) FLUID  Final    Comment: PLEURAL Performed at [facility], [address] [place], [place] [address]   Fungus Culture Result     Status: None    Collection Time: 11/02/22  4:44 PM  Result Value Ref Range Status   Result 1 Comment  Final    Comment: (NOTE) KOH/Calcofluor preparation:  no fungus observed. Performed At: [facility] [address] [place], [place] [id number] Jolene Schimke MD [id number]   Culture, blood (routine x 2) Call MD if unable to obtain prior to antibiotics being given     Status: None   Collection Time: 11/03/22  8:49 AM   Specimen: BLOOD LEFT ARM  Result Value Ref Range Status   Specimen Description BLOOD LEFT ARM  Final   Special Requests   Final    BOTTLES DRAWN AEROBIC AND ANAEROBIC Blood Culture adequate volume   Culture   Final    NO GROWTH 5 DAYS Performed at [facility], [address], [place], [place] [address]    Report Status 11/08/2022 FINAL  Final  Culture, blood (routine x 2) Call MD if unable to obtain prior to antibiotics being given     Status: None   Collection Time: 11/03/22  8:55 AM   Specimen: BLOOD LEFT ARM  Result Value Ref Range Status   Specimen Description BLOOD LEFT ARM  Final   Special Requests   Final    BOTTLES DRAWN AEROBIC AND ANAEROBIC Blood Culture adequate volume   Culture   Final    NO GROWTH 5 DAYS Performed at [facility], [address] [place], [place] [address]    Report Status 11/08/2022 FINAL  Final      Radiology Studies: DG CHEST PORT 1 VIEW  Result Date: 11/10/2022 CLINICAL DATA:  Chest tube. EXAM: PORTABLE CHEST 1 VIEW COMPARISON:  November 08, 2022. FINDINGS: Stable cardiomegaly. Right internal jugular dialysis catheter is unchanged. Stable diffuse reticular opacities are noted concerning for pulmonary edema with bibasilar atelectasis or infiltrates. Stable left-sided pleural drainage catheter without definite pneumothorax. Minimal left pleural effusion may be present. Bony thorax is unremarkable. IMPRESSION: Stable bilateral lung opacities as noted above. Stable position of left-sided pleural drainage catheter without definite  pneumothorax. Electronically Signed   By: Lupita Raider M.D.   On: 11/10/2022 08:53      Scheduled Meds:  (feeding supplement) PROSource Plus  30 mL Oral BID BM   amiodarone  200 mg Oral Daily  Chlorhexidine Gluconate Cloth  6 each Topical Q0600   darbepoetin (ARANESP) injection - DIALYSIS  40 mcg Subcutaneous Q Sat-1800   hydrocerin   Topical Daily   lidocaine  3 patch Transdermal Q24H   midodrine  15 mg Oral TID WC   polyethylene glycol  17 g Oral BID   senna-docusate  1 tablet Oral BID   sevelamer carbonate  4.8 g Oral TID WC   sodium chloride flush  10 mL Intrapleural Q8H   sodium chloride flush  10 mL Intrapleural Q8H   Continuous Infusions:  anticoagulant sodium citrate 100 mL/hr at 11/08/22 1735   cefTRIAXone (ROCEPHIN)  IV 2 g (11/10/22 1910)     LOS: 15 days   Time spent: 25 minutes   Noralee Stain, DO [facility] 11/11/2022, 12:10 PM   Available via Epic secure chat 7am-7pm After these hours, please refer to coverage provider listed on [email or web address]

## 2022-11-11 NOTE — Progress Notes (Signed)
PCCM Interval Note  Chest tube removed. CXR with no pneumothorax. Patient on room air  PCCM will sign off. Please call for any concerns or questions

## 2022-11-11 NOTE — Progress Notes (Signed)
ANTICOAGULATION CONSULT NOTE - Initial Consult  Pharmacy Consult for warfarin Indication: atrial fibrillation  Allergies  Allergen Reactions   Ciprofloxacin Other (See Comments)    Aortic dissection   Heparin Other (See Comments)    UNSPECIFIED REACTION :  On Coumadin since 2004   HIT panel negative 01/19/17   Doxercalciferol Other (See Comments)   Quinolones Other (See Comments)    unknown    Patient Measurements: Height: 6\' 3"  (190.5 cm) Weight: 61.4 kg (135 lb 5.8 oz) IBW/kg (Calculated) : 84.5  Vital Signs: Temp: 98.2 F (36.8 C) (08/09 0830) Temp Source: Oral (08/09 0830) BP: 102/68 (08/09 0830) Pulse Rate: 70 (08/09 0830)  Labs: Recent Labs    11/09/22 0221 11/11/22 0008 11/11/22 1120  HGB 9.8* 10.9*  --   HCT 33.5* 37.5*  --   PLT 169 143*  --   LABPROT  --   --  15.7*  INR  --   --  1.2  CREATININE 5.52* 4.76*  --     Estimated Creatinine Clearance: 15.8 mL/min (A) (by C-G formula based on SCr of 4.76 mg/dL (H)).   Medical History: Past Medical History:  Diagnosis Date   Arthritis    hands and shoulders   Blindness and low vision    "Stargardt disease"   Dissection of aorta (HCC) 2004   a. s/p extensive repeair in 2004 in [place] complicated by ESRD, lower extremity paralysis, coma, and extended hospitalization of 2 years   ESRD (end stage renal disease) (HCC)    a. TTS   Headache    History of cardioversion 2014   Hypertension    Neuropathy    Non-healing non-surgical wound 03/2016   PAF (paroxysmal atrial fibrillation) (HCC)    a. s/p DCCV in 2014; b. on Coumadin; c. CHADS2VASc => 2 (HTN, vascular disease)   Paralysis (HCC)    due to dissection of aorta in 2004, lower extremities   Pneumonia     Medications:  Medications Prior to Admission  Medication Sig Dispense Refill Last Dose   amiodarone (PACERONE) 200 MG tablet Take 1 tablet (200 mg total) by mouth daily. 90 tablet 3 10/26/2022   cinacalcet (SENSIPAR) 60 MG tablet Take 120 mg by  mouth every evening.   10/26/2022   Multiple Vitamins-Minerals (MULTIVITAMIN WITH MINERALS) tablet Take 1 tablet by mouth daily.   10/26/2022   RENVELA 2.4 g PACK Take 4.8 g by mouth in the morning, at noon, and at bedtime.   10/26/2022   warfarin (COUMADIN) 2 MG tablet Take 1 tablet by mouth daily or as directed by Anticoagulation Clinic. 30 tablet 0 10/26/2022   docusate sodium (COLACE) 100 MG capsule Take 1 capsule (100 mg total) by mouth every 12 (twelve) hours. (Patient not taking: Reported on 10/27/2022) 60 capsule 0 Not Taking   metoprolol tartrate (LOPRESSOR) 25 MG tablet Take 0.5 tablets (12.5 mg total) by mouth 2 (two) times daily. Hold on the AM of dialysis days (Patient not taking: Reported on 10/27/2022) 90 tablet 3 Not Taking   Scheduled:   (feeding supplement) PROSource Plus  30 mL Oral BID BM   amiodarone  200 mg Oral Daily   Chlorhexidine Gluconate Cloth  6 each Topical Q0600   darbepoetin (ARANESP) injection - DIALYSIS  40 mcg Subcutaneous Q Sat-1800   hydrocerin   Topical Daily   lidocaine  3 patch Transdermal Q24H   midodrine  15 mg Oral TID WC   polyethylene glycol  17 g Oral BID  senna-docusate  1 tablet Oral BID   sevelamer carbonate  4.8 g Oral TID WC   sodium chloride flush  10 mL Intrapleural Q8H   sodium chloride flush  10 mL Intrapleural Q8H    Assessment: 52 yo male with pleural effusion d/p chest tube placement and removal on 8/9. He is on warfarin PTA for history of afib and pharmacy consulted to resume warfarin -INR= 1.2, Hg= 10.9, plt= 143  Home regimen: 2mg  /day  Goal of Therapy:  INR 2-3 Monitor platelets by anticoagulation protocol: Yes   Plan:  -Warfarin 2mg  po today -Daily PT/INR  Harland German, PharmD Clinical Pharmacist **Pharmacist phone directory can now be found on [email or web address] ([email or web address]).  Listed under [facility].

## 2022-11-11 NOTE — Progress Notes (Signed)
Inpatient Rehab Admissions Coordinator Note:   Per therapy recommendations patient was screened for CIR candidacy by Stephania Fragmin, PT. At this time, pt appears to be a potential candidate for CIR. I will place an order for rehab consult for full assessment, per our protocol.  Please contact me any with questions.Estill Dooms, PT, DPT [phone] 11/11/22 12:28 PM

## 2022-11-11 NOTE — Evaluation (Signed)
Occupational Therapy Evaluation Patient Details Name: Corey [name] MRN: [id number] DOB: 1970/06/10 Today's Date: 11/11/2022   History of Present Illness 52 year old man admitted 7/25 with left pleural effusion.  He has had general decline in the state of health over the last several months with loss of appetite. Pt with large left loculated pleural effusion with CT placed 7/31.  tPa 8/3.  PMH:  ESRD, T,Th, Sat HD; paralysis bil LEs after aortic dissection, blindness   Clinical Impression   Prior to this admission, patient living with his brother who assisted with IADLs, though his brother also uses a w/c at baseline. Patient could complete his ADLs with extra time and could transfer to his w/c independently. Patient does have a visual impairment at baseline, but reports he can see shadows and is able to use his phone functionally. Currently, patient presenting with pain, generalized weakness, 4L O2 to maintain sats, and need for increased assist to complete all ADLs and functional mobility. Patient was able to transfer to the recliner (drop arm) with min A for safety, and is min-mod A for ADLs. Given his previous level of independence, OT recommending intensive rehab greater than 3 hours in order to return to prior level of function. OT will continue to follow acutely.       If plan is discharge home, recommend the following: A little help with walking and/or transfers;A little help with bathing/dressing/bathroom;Assistance with cooking/housework;Direct supervision/assist for medications management;Direct supervision/assist for financial management;Assist for transportation;Help with stairs or ramp for entrance    Functional Status Assessment  Patient has had a recent decline in their functional status and demonstrates the ability to make significant improvements in function in a reasonable and predictable amount of time.  Equipment Recommendations  Other (comment) (defer to next venue)     Recommendations for Other Services Rehab consult     Precautions / Restrictions Precautions Precautions: Fall Precaution Comments: CT in place Restrictions Weight Bearing Restrictions: No      Mobility Bed Mobility Overal bed mobility: Needs Assistance Bed Mobility: Rolling, Supine to Sit Rolling: Contact guard assist   Supine to sit: Contact guard     General bed mobility comments: With cues and rail, pt to EOB on his own. Moves slowly but was able to come to EOB.    Transfers Overall transfer level: Needs assistance Equipment used: None Transfers: Bed to chair/wheelchair/BSC            Lateral/Scoot Transfers: Min assist, From elevated surface, +2 safety/equipment General transfer comment: Pt able to scoot to drop arm recliner to his right with min guard most of time with 2 boosts with use of pad to assist to initiate movement. Pt is able to boost buttocks up enough to perform scoots.  Pt fatigued once in chair.      Balance Overall balance assessment: Mild deficits observed, not formally tested                                         ADL either performed or assessed with clinical judgement   ADL Overall ADL's : Needs assistance/impaired Eating/Feeding: Set up;Sitting   Grooming: Set up;Sitting   Upper Body Bathing: Set up;Sitting   Lower Body Bathing: Moderate assistance;Sit to/from stand;Sitting/lateral leans Lower Body Bathing Details (indicate cue type and reason): for thoroughness Upper Body Dressing : Set up;Sitting   Lower Body Dressing: Maximal assistance;Sitting/lateral leans Lower Body  Dressing Details (indicate cue type and reason): donning socks, poor skin integrity and painful Toilet Transfer: Minimal assistance;Squat-pivot Toilet Transfer Details (indicate cue type and reason): patient completing incremental scoot to recliner, min A solely for safety Toileting- Clothing Manipulation and Hygiene: Minimal  assistance;Sitting/lateral lean Toileting - Clothing Manipulation Details (indicate cue type and reason): for thoroughness     Functional mobility during ADLs: Moderate assistance;Cueing for sequencing;Cueing for safety General ADL Comments: Patient presenting with pain, generalized weakness, 4L O2 to maintain sats, and need for increased assist to complete all ADLs and functional mobility.     Vision Baseline Vision/History: 2 Legally blind;1 Wears glasses Ability to See in Adequate Light: 3 Highly impaired Patient Visual Report: Other (comment) (only sees shadows per patient, L eye has become a little worse, shadows are worse) Vision Assessment?: No apparent visual deficits Additional Comments: Due to visual impairment, formal assesment not completed, patient is able to see shadows and complete functional tasks, is able to see phone with increased concentration but functional     Perception Perception: Not tested       Praxis Praxis: Not tested       Pertinent Vitals/Pain Pain Assessment Pain Assessment: Faces Faces Pain Scale: Hurts even more Pain Location: "hurts all over" Pain Descriptors / Indicators: Discomfort, Guarding, Grimacing Pain Intervention(s): Limited activity within patient's tolerance, Monitored during session, Repositioned     Extremity/Trunk Assessment Upper Extremity Assessment Upper Extremity Assessment: Generalized weakness;Left hand dominant   Lower Extremity Assessment Lower Extremity Assessment: Defer to PT evaluation RLE Deficits / Details: Hip and knee 2/5, ankle 2+/5, Slight flexion contracture to 10 degrees flexion when pt supine RLE: Unable to fully assess due to pain LLE Deficits / Details: Hip and knee 2/5, ankle 2+/5,  Slight flexion contracture to 10 degrees flexion when pt supine LLE: Unable to fully assess due to pain   Cervical / Trunk Assessment Cervical / Trunk Assessment: Kyphotic   Communication Communication Communication: No  apparent difficulties   Cognition Arousal: Alert Behavior During Therapy: Flat affect Overall Cognitive Status: Within Functional Limits for tasks assessed                                       General Comments  Bil feet with skin "leathery" with pt c/o pain.  Stiffness noted in feet and legs. VSS with pt on 4LO2 with sats 98%.  States he wore O2 as needed at home    Exercises     Shoulder Instructions      Home Living Family/patient expects to be discharged to:: Private residence Living Arrangements: Other relatives Available Help at Discharge: Family;Available 24 hours/day (Brother but cannot provide much assist) Type of Home: House Home Access: Ramped entrance     Home Layout: One level;Full bath on main level           Bathroom Accessibility: Yes How Accessible: Accessible via wheelchair Home Equipment: BSC/3in1;Wheelchair - manual          Prior Functioning/Environment Prior Level of Function : Needs assist       Physical Assist : ADLs (physical);Mobility (physical) Mobility (physical): Transfers (occasionally, but was independent with transfers) ADLs (physical): IADLs Mobility Comments: uses a wheelchair for mobility ADLs Comments: sponge bathes the best he can, brother typically does higher level ADLs        OT Problem List: Decreased strength;Decreased range of motion;Decreased activity tolerance;Impaired balance (sitting  and/or standing);Impaired vision/perception;Decreased coordination;Decreased cognition;Decreased safety awareness;Decreased knowledge of precautions;Cardiopulmonary status limiting activity;Impaired sensation;Pain      OT Treatment/Interventions: Self-care/ADL training;Therapeutic exercise;Neuromuscular education;Energy conservation;Manual therapy;Patient/family education;Balance training;Visual/perceptual remediation/compensation;Therapeutic activities    OT Goals(Current goals can be found in the care plan section)  Acute Rehab OT Goals Patient Stated Goal: to get better before going home OT Goal Formulation: With patient Time For Goal Achievement: 11/25/22 Potential to Achieve Goals: Good  OT Frequency: Min 2X/week    Co-evaluation              AM-PAC OT "6 Clicks" Daily Activity     Outcome Measure Help from another person eating meals?: None Help from another person taking care of personal grooming?: None Help from another person toileting, which includes using toliet, bedpan, or urinal?: A Little Help from another person bathing (including washing, rinsing, drying)?: A Lot Help from another person to put on and taking off regular upper body clothing?: A Little Help from another person to put on and taking off regular lower body clothing?: A Lot 6 Click Score: 18   End of Session Nurse Communication: Mobility status  Activity Tolerance: Patient tolerated treatment well Patient left: in chair;with call bell/phone within reach;with chair alarm set  OT Visit Diagnosis: Unsteadiness on feet (R26.81);Other abnormalities of gait and mobility (R26.89);Muscle weakness (generalized) (M62.81);Low vision, both eyes (H54.2);Pain;Adult, failure to thrive (R62.7) Pain - part of body:  (Generalized)                Time: 2952-8413 OT Time Calculation (min): 25 min Charges:  OT General Charges $OT Visit: 1 Visit OT Evaluation $OT Eval Moderate Complexity: 1 Mod  Pollyann Glen E. , OTR/L Acute Rehabilitation Services [phone]   Cherlyn Cushing 11/11/2022, 10:49 AM

## 2022-11-12 DIAGNOSIS — J90 Pleural effusion, not elsewhere classified: Secondary | ICD-10-CM | POA: Diagnosis not present

## 2022-11-12 MED ORDER — WARFARIN SODIUM 2 MG PO TABS
2.0000 mg | ORAL_TABLET | Freq: Once | ORAL | Status: AC
  Administered 2022-11-12: 2 mg via ORAL
  Filled 2022-11-12 (×2): qty 1

## 2022-11-12 MED ORDER — ANTICOAGULANT SODIUM CITRATE 4% (200MG/5ML) IV SOLN
5.0000 mL | Status: DC | PRN
  Administered 2022-11-12: 3.8 mL
  Filled 2022-11-12: qty 3.8

## 2022-11-12 NOTE — Plan of Care (Signed)
  Problem: Education: Goal: Knowledge of General Education information will improve Description: Including pain rating scale, medication(s)/side effects and non-pharmacologic comfort measures Outcome: Progressing   Problem: Health Behavior/Discharge Planning: Goal: Ability to manage health-related needs will improve Outcome: Progressing   Problem: Activity: Goal: Risk for activity intolerance will decrease Outcome: Progressing   Problem: Pain Managment: Goal: General experience of comfort will improve Outcome: Progressing   Problem: Skin Integrity: Goal: Risk for impaired skin integrity will decrease Outcome: Progressing   

## 2022-11-12 NOTE — Progress Notes (Signed)
PT Cancellation Note  Patient Details Name: Corey [name] MRN: [id number] DOB: Jan 30, 1971   Cancelled Treatment:    Reason Eval/Treat Not Completed: (P) Patient at procedure or test/unavailable. Pt at HD. Will plan to follow-up later as time permits.   Raymond Gurney, PT, DPT Acute Rehabilitation Services  Office: [phone]    Jewel Baize 11/12/2022, 7:57 AM

## 2022-11-12 NOTE — Progress Notes (Signed)
  [facility] Progress Note   Subjective:  Seen in HD, bp's soft , no UF  Objective Vitals:   11/12/22 1130 11/12/22 1151 11/12/22 1156 11/12/22 1230  BP: 107/61 111/65 121/70 115/63  Pulse: (!) 58 61 (!) 58 62  Resp: 16 17 17 15   Temp:   98 F (36.7 C) 98 F (36.7 C)  TempSrc:    Oral  SpO2: 100% 100% 100% 100%  Weight:   61.3 kg   Height:       Physical Exam General: Chronically ill appearing man, NAD. Nasal O2 in place Heart: RRR; no murmur Lungs: L chest tube in place, clear anteriorly Abdomen: soft Extremities: chronic woody changes without  edema +cachectic Dialysis Access: TDC  c/d/i  OP HD: TTS [facility] 4h  2/2 bath  66kg   TDC   no heparin (allergy)  lock TDC w/ citrate - No ESA or VDRA - Binder: Renvela pwdr 4.8g TID - Sensipar 180mg  every day at home   Assessment/Plan: Acute hypoxic respiratory failure: In setting of missed HD, pna w/ effusion. CXR with pleural effusion s/p chest tube 7/31. Resolved. PNA/ L parapneumonic pleural effusion: s/p thoracentesis with cultures. Got IV abx course, now on augmentin thru 8/14.  Afebrile.  CT out.  ESRD: usual TTS schedule. HD today.  Heparin allergy - use citrate for cath lock Hypotension/ volume: BP low, LE edema resolved.  Is on midodrine. 4kg under dry wt, euvolemic now. Unable to pull fluid today. Last  CXR chronic changes.  Anemia of ESRD:  Hgb 9s - s/p Aranesp on 7/27.  Hb stable  Secondary HPTH: Ca/Phos ok - continue home binders.  Held cinacalcet due to hypoCa. Ca++ remains low, cont to hold cinacalcet.  A-fib: On amiodarone + warfarin (on hold for chest tube), has IVC filter Hx type B aortic dissection s/p repair (2004)     Corey Moselle  MD  [facility] 11/12/2022, 12:54 PM  Recent Labs  Lab 11/08/22 0332 11/09/22 0221 11/11/22 0008 11/12/22 0049  HGB 10.2* 9.8* 10.9* 10.7*  ALBUMIN 2.5* 2.4*  --   --   CALCIUM 7.7* 7.8* 8.2* 8.5*  PHOS 3.8 4.3  --   --   CREATININE 4.82* 5.52* 4.76* 6.26*   K 4.0 4.0 3.8 4.1    Inpatient medications:  (feeding supplement) PROSource Plus  30 mL Oral BID BM   amiodarone  200 mg Oral Daily   amoxicillin-clavulanate  1 tablet Oral QHS   Chlorhexidine Gluconate Cloth  6 each Topical Q0600   darbepoetin (ARANESP) injection - DIALYSIS  40 mcg Subcutaneous Q Sat-1800   hydrocerin   Topical Daily   lidocaine  3 patch Transdermal Q24H   midodrine  15 mg Oral TID WC   polyethylene glycol  17 g Oral BID   senna-docusate  1 tablet Oral BID   sevelamer carbonate  4.8 g Oral TID WC   sodium chloride flush  10 mL Intrapleural Q8H   sodium chloride flush  10 mL Intrapleural Q8H   warfarin  2 mg Oral ONCE-1600   Warfarin - Pharmacist Dosing Inpatient   Does not apply q1600    anticoagulant sodium citrate 100 mL/hr at 11/08/22 1735   acetaminophen, anticoagulant sodium citrate, calcium carbonate (dosed in mg elemental calcium), camphor-menthol **AND** hydrOXYzine, docusate sodium, HYDROmorphone (DILAUDID) injection, ondansetron **OR** ondansetron (ZOFRAN) IV, oxyCODONE, sorbitol, zolpidem

## 2022-11-12 NOTE — Progress Notes (Signed)
PROGRESS NOTE    Corey [name]  VWU:[id number] DOB: 19-Apr-1970 DOA: 10/27/2022 PCP: [name], NP     Brief Narrative:  Corey [name] is a 52 y.o. male with medical history significant of end-stage renal disease on hemodialysis TTS, paroxysmal atrial fibrillation on chronic Coumadin therapy, history of previous aortic dissection, peripheral vascular disease, history of osteoarthritis, history of neuropathy, who missed hemodialysis twice secondary to nausea vomiting and diarrhea.  Patient came to the ER with shortness of breath. Was found to be hypoxic with new oxygen requirement.  Also significant hyperkalemia.  Nephrology was consulted and patient is being dialyzed.  As part of his workup patient was found to have complex left-sided pleural effusion that looked loculated.  Patient underwent chest tube placement by PCCM 7/31 and several rounds of tPA.  New events last 24 hours / Subjective: Patient seen in HD. No acute events.   Assessment & Plan:   Principal Problem:   Pleural effusion Active Problems:   ESRD on dialysis [facility])   Essential hypertension   Chronic diastolic CHF (congestive heart failure) (HCC)   Atrial fibrillation, chronic (HCC)   End-stage renal disease on hemodialysis (HCC)   AAA (abdominal aortic aneurysm) without rupture (HCC)   Hyperkalemia   PVD (peripheral vascular disease) (HCC)   Constipation   Lobar pneumonia (HCC)   Parapneumonic effusion   Need for management of chest tube   Acute respiratory failure secondary to large loculated left-sided pleural effusion/parapneumonic effusion -Status post chest tube placement by [facility] 7/31 -Pleural fluid analysis consistent with exudative parapneumonic effusion -Status post tPA -Chest tube removed 8/9  -Rocephin, completed azithromycin --> Augmentin through 8/14   ESRD -HD TTS -Nephrology following  Hypotension -Status post IV albumin x 2, midodrine  Paroxysmal A-fib -Amiodarone, coumadin    Chronic diastolic CHF -Volume management with HD  PVD -Outpatient follow-up  Constipation -Bowel regimen  Weakness -PT OT recommending CIR    DVT prophylaxis: Coumadin  Code Status: Full Family Communication: None at bedside  Disposition Plan: Home Status is: Inpatient Remains inpatient appropriate because: CIR recommended. Medically ready to go when approved.    Antimicrobials:  Anti-infectives (From admission, onward)    Start     Dose/Rate Route Frequency Ordered Stop   11/11/22 2200  amoxicillin-clavulanate (AUGMENTIN) 500-125 MG per tablet 1 tablet        1 tablet Oral Daily at bedtime 11/11/22 1316 11/17/22 2159   11/08/22 1800  cefTRIAXone (ROCEPHIN) 2 g in sodium chloride 0.9 % 100 mL IVPB  Status:  Discontinued        2 g 200 mL/hr over 30 Minutes Intravenous Every 24 hours 11/08/22 1656 11/11/22 1316   11/03/22 0915  cefTRIAXone (ROCEPHIN) 2 g in sodium chloride 0.9 % 100 mL IVPB        2 g 200 mL/hr over 30 Minutes Intravenous Every 24 hours 11/03/22 0823 11/08/22 1701   11/03/22 0915  azithromycin (ZITHROMAX) 500 mg in sodium chloride 0.9 % 250 mL IVPB        500 mg 250 mL/hr over 60 Minutes Intravenous Every 24 hours 11/03/22 0823 11/08/22 1701        Objective: Vitals:   11/12/22 0900 11/12/22 0915 11/12/22 0930 11/12/22 1000  BP: (!) 97/54 (!) 100/57 (!) 98/54 106/64  Pulse: (!) 51 (!) 52 (!) 50 (!) 54  Resp: 12 12 11 12   Temp:      TempSrc:      SpO2: 98% 100% 100% 100%  Weight:      Height:        Intake/Output Summary (Last 24 hours) at 11/12/2022 1012 Last data filed at 11/12/2022 0800 Gross per 24 hour  Intake 120 ml  Output 100 ml  Net 20 ml   Filed Weights   11/07/22 1215 11/10/22 1400 11/12/22 0800  Weight: 63.4 kg 61.4 kg 61.6 kg    Examination:  General exam: Appears calm and comfortable  Respiratory system: Clear to auscultation. Respiratory effort normal. Without increase in WOB  Cardiovascular system: S1 & S2 heard,  brady rate, reg rhythm. No murmurs. No pedal edema. Gastrointestinal system: Abdomen is nondistended, soft Central nervous system: Alert  Extremities: Symmetric in appearance  Skin: No rashes, lesions or ulcers on exposed skin   Data Reviewed: I have personally reviewed following labs and imaging studies  CBC: Recent Labs  Lab 11/06/22 0138 11/07/22 0048 11/08/22 0332 11/09/22 0221 11/11/22 0008 11/12/22 0049  WBC 13.1* 11.5* 10.7* 10.1 9.2 9.4  NEUTROABS 11.2* 9.4*  --   --   --   --   HGB 9.6* 10.1* 10.2* 9.8* 10.9* 10.7*  HCT 32.5* 33.8* 34.5* 33.5* 37.5* 36.4*  MCV 96.4 98.0 98.6 97.7 96.6 97.1  PLT 193 233 187 169 143* 132*   Basic Metabolic Panel: Recent Labs  Lab 11/06/22 0138 11/07/22 0048 11/08/22 0332 11/09/22 0221 11/11/22 0008 11/12/22 0049  NA 137 138 136 135 135 136  K 4.0 4.4 4.0 4.0 3.8 4.1  CL 99 98 96* 97* 93* 96*  CO2 24 24 27 25 26 23   GLUCOSE 96 100* 88 87 91 81  BUN 38* 45* 23* 31* 28* 41*  CREATININE 6.37* 7.36* 4.82* 5.52* 4.76* 6.26*  CALCIUM 7.1* 7.5* 7.7* 7.8* 8.2* 8.5*  PHOS 3.3 4.0 3.8 4.3  --   --    GFR: Estimated Creatinine Clearance: 12 mL/min (A) (by C-G formula based on SCr of 6.26 mg/dL (H)). Liver Function Tests: Recent Labs  Lab 11/06/22 0138 11/07/22 0048 11/08/22 0332 11/09/22 0221  ALBUMIN 2.7* 2.6* 2.5* 2.4*   No results for input(s): "LIPASE", "AMYLASE" in the last 168 hours. No results for input(s): "AMMONIA" in the last 168 hours. Coagulation Profile: Recent Labs  Lab 11/05/22 1243 11/08/22 0332 11/11/22 1120 11/12/22 0049  INR 1.5* 2.2* 1.2 1.4*   Cardiac Enzymes: No results for input(s): "CKTOTAL", "CKMB", "CKMBINDEX", "TROPONINI" in the last 168 hours. BNP (last 3 results) No results for input(s): "PROBNP" in the last 8760 hours. HbA1C: No results for input(s): "HGBA1C" in the last 72 hours. CBG: No results for input(s): "GLUCAP" in the last 168 hours. Lipid Profile: No results for input(s):  "CHOL", "HDL", "LDLCALC", "TRIG", "CHOLHDL", "LDLDIRECT" in the last 72 hours. Thyroid Function Tests: No results for input(s): "TSH", "T4TOTAL", "FREET4", "T3FREE", "THYROIDAB" in the last 72 hours. Anemia Panel: No results for input(s): "VITAMINB12", "FOLATE", "FERRITIN", "TIBC", "IRON", "RETICCTPCT" in the last 72 hours. Sepsis Labs: No results for input(s): "PROCALCITON", "LATICACIDVEN" in the last 168 hours.  Recent Results (from the past 240 hour(s))  Body fluid culture w Gram Stain     Status: None   Collection Time: 11/02/22  4:44 PM   Specimen: Pleural Fluid  Result Value Ref Range Status   Specimen Description FLUID PLEURAL  Final   Special Requests NONE  Final   Gram Stain   Final    RARE WBC PRESENT, PREDOMINANTLY PMN NO ORGANISMS SEEN    Culture   Final    NO GROWTH  3 DAYS Performed at [facility], [address] [place], [place] [address]    Report Status 11/06/2022 FINAL  Final  Fungus Culture With Stain     Status: None (Preliminary result)   Collection Time: 11/02/22  4:44 PM   Specimen: Pleural Fluid  Result Value Ref Range Status   Fungus Stain Final report  Final    Comment: (NOTE) Performed At: [facility] [address] [place], [place] [id number] Jolene Schimke MD [id number]    Fungus (Mycology) Culture PENDING  Incomplete   Fungal Source FLUID  Final    Comment: PLEURAL Performed at [facility], [address], [place], [place] [address]   Acid Fast Smear (AFB)     Status: None   Collection Time: 11/02/22  4:44 PM   Specimen: Pleural, Left; Pleural Fluid  Result Value Ref Range Status   AFB Specimen Processing Concentration  Final   Acid Fast Smear Negative  Final    Comment: (NOTE) Performed At: [facility] [address] [place], [place] [id number] Jolene Schimke MD [id number]    Source (AFB) FLUID  Final    Comment: PLEURAL Performed at [facility], [address], [place], [place] [address]    Fungus Culture Result     Status: None   Collection Time: 11/02/22  4:44 PM  Result Value Ref Range Status   Result 1 Comment  Final    Comment: (NOTE) KOH/Calcofluor preparation:  no fungus observed. Performed At: [facility] [address] [place], [place] [id number] Jolene Schimke MD [id number]   Culture, blood (routine x 2) Call MD if unable to obtain prior to antibiotics being given     Status: None   Collection Time: 11/03/22  8:49 AM   Specimen: BLOOD LEFT ARM  Result Value Ref Range Status   Specimen Description BLOOD LEFT ARM  Final   Special Requests   Final    BOTTLES DRAWN AEROBIC AND ANAEROBIC Blood Culture adequate volume   Culture   Final    NO GROWTH 5 DAYS Performed at [facility], [address] [place], [place] [address]    Report Status 11/08/2022 FINAL  Final  Culture, blood (routine x 2) Call MD if unable to obtain prior to antibiotics being given     Status: None   Collection Time: 11/03/22  8:55 AM   Specimen: BLOOD LEFT ARM  Result Value Ref Range Status   Specimen Description BLOOD LEFT ARM  Final   Special Requests   Final    BOTTLES DRAWN AEROBIC AND ANAEROBIC Blood Culture adequate volume   Culture   Final    NO GROWTH 5 DAYS Performed at [facility], [address], [place], [place] [address]    Report Status 11/08/2022 FINAL  Final      Radiology Studies: DG CHEST PORT 1 VIEW  Result Date: 11/11/2022 CLINICAL DATA:  Chest tube removal. EXAM: PORTABLE CHEST 1 VIEW COMPARISON:  Chest radiograph dated 11/10/2022. FINDINGS: Right-sided catheter in similar position. There is cardiomegaly with vascular congestion and edema. Bibasilar atelectasis or infiltrate. No large pleural effusion. No pneumothorax. No acute osseous pathology. IMPRESSION: Cardiomegaly with vascular congestion and edema.  No pneumothorax. Electronically Signed   By: Elgie Collard M.D.   On: 11/11/2022 17:52      Scheduled Meds:  (feeding  supplement) PROSource Plus  30 mL Oral BID BM   amiodarone  200 mg Oral Daily   amoxicillin-clavulanate  1 tablet Oral QHS   Chlorhexidine Gluconate Cloth  6 each Topical Q0600   darbepoetin (ARANESP) injection - DIALYSIS  40 mcg Subcutaneous Q Sat-1800   hydrocerin   Topical Daily   lidocaine  3 patch Transdermal Q24H   midodrine  15 mg Oral TID WC   polyethylene glycol  17 g Oral BID   senna-docusate  1 tablet Oral BID   sevelamer carbonate  4.8 g Oral TID WC   sodium chloride flush  10 mL Intrapleural Q8H   sodium chloride flush  10 mL Intrapleural Q8H   Warfarin - Pharmacist Dosing Inpatient   Does not apply q1600   Continuous Infusions:  anticoagulant sodium citrate 100 mL/hr at 11/08/22 1735   anticoagulant sodium citrate       LOS: 16 days   Time spent: 25 minutes   Noralee Stain, DO [facility] 11/12/2022, 10:12 AM   Available via Epic secure chat 7am-7pm After these hours, please refer to coverage provider listed on [email or web address]

## 2022-11-12 NOTE — Plan of Care (Signed)
  Problem: Education: Goal: Knowledge of General Education information will improve Description: Including pain rating scale, medication(s)/side effects and non-pharmacologic comfort measures 11/12/2022 0639 by Annett Fabian, RN Outcome: Progressing 11/12/2022 0541 by Annett Fabian, RN Outcome: Progressing   Problem: Health Behavior/Discharge Planning: Goal: Ability to manage health-related needs will improve Outcome: Progressing   Problem: Activity: Goal: Risk for activity intolerance will decrease 11/12/2022 0639 by Annett Fabian, RN Outcome: Progressing 11/12/2022 0541 by Annett Fabian, RN Outcome: Progressing   Problem: Nutrition: Goal: Adequate nutrition will be maintained Outcome: Progressing   Problem: Pain Managment: Goal: General experience of comfort will improve 11/12/2022 0639 by Annett Fabian, RN Outcome: Progressing 11/12/2022 0541 by Annett Fabian, RN Outcome: Progressing   Problem: Skin Integrity: Goal: Risk for impaired skin integrity will decrease 11/12/2022 0639 by Annett Fabian, RN Outcome: Progressing 11/12/2022 0541 by Annett Fabian, RN Outcome: Progressing

## 2022-11-12 NOTE — Progress Notes (Signed)
ANTICOAGULATION CONSULT NOTE - Follow Up Consult  Pharmacy Consult for warfarin Indication: atrial fibrillation  Allergies  Allergen Reactions   Ciprofloxacin Other (See Comments)    Aortic dissection   Heparin Other (See Comments)    UNSPECIFIED REACTION :  On Coumadin since 2004   HIT panel negative 01/19/17   Doxercalciferol Other (See Comments)   Quinolones Other (See Comments)    unknown    Patient Measurements: Height: 6\' 3"  (190.5 cm) Weight: 61.6 kg (135 lb 12.9 oz) IBW/kg (Calculated) : 84.5  Vital Signs: Temp: 98 F (36.7 C) (08/10 0800) Temp Source: Oral (08/10 0738) BP: 105/64 (08/10 0803) Pulse Rate: 52 (08/10 0803)  Labs: Recent Labs    11/11/22 0008 11/11/22 1120 11/12/22 0049  HGB 10.9*  --  10.7*  HCT 37.5*  --  36.4*  PLT 143*  --  132*  LABPROT  --  15.7* 17.1*  INR  --  1.2 1.4*  CREATININE 4.76*  --  6.26*    Estimated Creatinine Clearance: 12 mL/min (A) (by C-G formula based on SCr of 6.26 mg/dL (H)).   Medications:  Scheduled:   (feeding supplement) PROSource Plus  30 mL Oral BID BM   amiodarone  200 mg Oral Daily   amoxicillin-clavulanate  1 tablet Oral QHS   Chlorhexidine Gluconate Cloth  6 each Topical Q0600   darbepoetin (ARANESP) injection - DIALYSIS  40 mcg Subcutaneous Q Sat-1800   hydrocerin   Topical Daily   lidocaine  3 patch Transdermal Q24H   midodrine  15 mg Oral TID WC   polyethylene glycol  17 g Oral BID   senna-docusate  1 tablet Oral BID   sevelamer carbonate  4.8 g Oral TID WC   sodium chloride flush  10 mL Intrapleural Q8H   sodium chloride flush  10 mL Intrapleural Q8H   Warfarin - Pharmacist Dosing Inpatient   Does not apply q1600    Assessment: 64 YOM with pleural effusion. S/p chest tube placement and removal on 8/9. Was on warfarin PTA for history of afib which was resumed yesterday. Of note, patient was on amiodarone PTA.   Patient is not compliant with anticoagulation follow-ups, but last home  regimen was warfarin 2 mg daily. Patient received warfarin 2 mg yesterday and INR increased from 1.2 to 1.4. Hgb 10.7 and PLT 132.   Goal of Therapy:  INR 2-3 Monitor platelets by anticoagulation protocol: Yes   Plan:  Warfarin 2 mg x1 dose today Daily PT/INR   Lennie Muckle, PharmD PGY1 Pharmacy Resident 11/12/2022 9:17 AM

## 2022-11-12 NOTE — Progress Notes (Signed)
Inpatient Rehab Admissions:  Inpatient Rehab Consult received.  I met with patient at the bedside for rehabilitation assessment and to discuss goals and expectations of an inpatient rehab admission.  Discussed average length of stay, insurance authorization requirement, discharge home after completion of CIR. Pt was lethargic and gave permission to contact brother. Called brother [name]. No one answered. Left a message. Awaiting return call. Will continue to follow.   Signed: Wolfgang Phoenix, MS, CCC-SLP Admissions Coordinator [phone]

## 2022-11-12 NOTE — Progress Notes (Signed)
   11/12/22 1156  Vitals  Temp 98 F (36.7 C)  Pulse Rate (!) 58  Resp 17  BP 121/70  SpO2 100 %  O2 Device Nasal Cannula  Weight 61.3 kg  Type of Weight Post-Dialysis  Oxygen Therapy  O2 Flow Rate (L/min) 2 L/min  Patient Activity (if Appropriate) In bed  Pulse Oximetry Type Continuous  Oximetry Probe Site Changed No  Post Treatment  Dialyzer Clearance Lightly streaked  Duration of HD Treatment -hour(s) 3.16 hour(s)  Hemodialysis Intake (mL) 0 mL  Liters Processed 64.8  Fluid Removed (mL) 300 mL  Tolerated HD Treatment Yes   Received patient in bed to unit.  Alert and oriented.  Informed consent signed and in chart.   TX duration:3.25  Patient tolerated well.  Transported back to the room  Alert, without acute distress.  Hand-off given to patient's nurse.   Access used: [facility] Access issues: lines reversed---  Total UF removed: Medication(s) given: none   [facility]

## 2022-11-12 NOTE — Progress Notes (Signed)
Patient with left leg cramp this morning. 8/10 pain, obvious distress. PRN dilaudid given.

## 2022-11-13 DIAGNOSIS — J90 Pleural effusion, not elsewhere classified: Secondary | ICD-10-CM | POA: Diagnosis not present

## 2022-11-13 LAB — PROTIME-INR
INR: 1.4 — ABNORMAL HIGH (ref 0.8–1.2)
Prothrombin Time: 16.9 s — ABNORMAL HIGH (ref 11.4–15.2)

## 2022-11-13 MED ORDER — WARFARIN SODIUM 3 MG PO TABS
3.0000 mg | ORAL_TABLET | Freq: Once | ORAL | Status: AC
  Administered 2022-11-13: 3 mg via ORAL
  Filled 2022-11-13: qty 1

## 2022-11-13 NOTE — Progress Notes (Signed)
Physical Therapy Treatment Patient Details Name: Corey [name] MRN: [id number] DOB: 08/30/1970 Today's Date: 11/13/2022   History of Present Illness 52 year old man admitted 7/25 with left pleural effusion.  He has had general decline in the state of health over the last several months with loss of appetite. Pt with large left loculated pleural effusion with CT placed 7/31.  tPa 8/3.  PMH:  ESRD, T,Th, Sat HD; paralysis bil LEs after aortic dissection, blindness    PT Comments  Pt reports fatigue and left flank pain, however, is motivated to participate and demonstrates good activity tolerance throughout session. Focus today on bed mobility, core/trunk strengthening, and postural re-education. Pt requiring min-mod assist for bed mobility. Demonstrates weakness in deep cervical extensors with tendency for forward head posture. Pt is typically modified independent at home. Suspect will be able to progress back to this level with intensive post acute rehabilitation to address deficits and maximize functional mobility.     If plan is discharge home, recommend the following: Assistance with cooking/housework;Assist for transportation;Help with stairs or ramp for entrance   Can travel by private vehicle        Equipment Recommendations  None recommended by PT    Recommendations for Other Services       Precautions / Restrictions Precautions Precautions: Fall Restrictions Weight Bearing Restrictions: No     Mobility  Bed Mobility Overal bed mobility: Needs Assistance Bed Mobility: Sit to Sidelying, Sidelying to Sit   Sidelying to sit: Min assist     Sit to sidelying: Mod assist General bed mobility comments: Pt received in R sidelying, assist with bed pad to scoot and pivot hips to edge of bed. Pt utilizing bed rails to elevate trunk. ModA for BLE elevation back into bed    Transfers                        Ambulation/Gait                   Stairs              Wheelchair Mobility     Tilt Bed    Modified Rankin (Stroke Patients Only)       Balance Overall balance assessment: Needs assistance Sitting-balance support: Feet supported, No upper extremity supported Sitting balance-Leahy Scale: Fair                                      Cognition Arousal: Alert Behavior During Therapy: Flat affect Overall Cognitive Status: Within Functional Limits for tasks assessed                                          Exercises Other Exercises Other Exercises: Sitting EOB: scapular squeezes x 10, shoulder shrugs x 10, Russian twists x 10, lateral leans onto R/L elbow x 5 each, modified push ups x 10    General Comments        Pertinent Vitals/Pain Pain Assessment Pain Assessment: Faces Faces Pain Scale: Hurts even more Pain Location: L flank Pain Descriptors / Indicators: Discomfort, Guarding, Grimacing Pain Intervention(s): Limited activity within patient's tolerance, Monitored during session, Repositioned    Home Living  Prior Function            PT Goals (current goals can now be found in the care plan section) Acute Rehab PT Goals Patient Stated Goal: to go home after rehab Potential to Achieve Goals: Good    Frequency    Min 1X/week      PT Plan      Co-evaluation              AM-PAC PT "6 Clicks" Mobility   Outcome Measure  Help needed turning from your back to your side while in a flat bed without using bedrails?: A Little Help needed moving from lying on your back to sitting on the side of a flat bed without using bedrails?: A Little Help needed moving to and from a bed to a chair (including a wheelchair)?: A Little Help needed standing up from a chair using your arms (e.g., wheelchair or bedside chair)?: Total Help needed to walk in hospital room?: Total Help needed climbing 3-5 steps with a railing? : Total 6 Click Score: 12     End of Session Equipment Utilized During Treatment: Oxygen Activity Tolerance: Patient tolerated treatment well Patient left: in bed;with call bell/phone within reach   PT Visit Diagnosis: Muscle weakness (generalized) (M62.81);Pain     Time: 1454-1520 PT Time Calculation (min) (ACUTE ONLY): 26 min  Charges:    $Therapeutic Exercise: 8-22 mins $Therapeutic Activity: 8-22 mins PT General Charges $$ ACUTE PT VISIT: 1 Visit                     Lillia Pauls, PT, DPT Acute Rehabilitation Services Office [phone]    Norval Morton 11/13/2022, 3:46 PM

## 2022-11-13 NOTE — Progress Notes (Signed)
ANTICOAGULATION CONSULT NOTE - Follow Up Consult  Pharmacy Consult for warfarin Indication: atrial fibrillation  Allergies  Allergen Reactions   Ciprofloxacin Other (See Comments)    Aortic dissection   Heparin Other (See Comments)    UNSPECIFIED REACTION :  On Coumadin since 2004   HIT panel negative 01/19/17   Doxercalciferol Other (See Comments)   Quinolones Other (See Comments)    unknown    Patient Measurements: Height: 6\' 3"  (190.5 cm) Weight: 61.3 kg (135 lb 2.3 oz) IBW/kg (Calculated) : 84.5  Vital Signs: Temp: 98.2 F (36.8 C) (08/11 1149) Temp Source: Oral (08/11 1149) BP: 77/43 (08/11 1149) Pulse Rate: 63 (08/11 1149)  Labs: Recent Labs    11/11/22 0008 11/11/22 1120 11/12/22 0049 11/13/22 0059  HGB 10.9*  --  10.7*  --   HCT 37.5*  --  36.4*  --   PLT 143*  --  132*  --   LABPROT  --  15.7* 17.1* 16.9*  INR  --  1.2 1.4* 1.4*  CREATININE 4.76*  --  6.26*  --     Estimated Creatinine Clearance: 12 mL/min (A) (by C-G formula based on SCr of 6.26 mg/dL (H)).   Medications:  Scheduled:   (feeding supplement) PROSource Plus  30 mL Oral BID BM   amiodarone  200 mg Oral Daily   amoxicillin-clavulanate  1 tablet Oral QHS   Chlorhexidine Gluconate Cloth  6 each Topical Q0600   darbepoetin (ARANESP) injection - DIALYSIS  40 mcg Subcutaneous Q Sat-1800   hydrocerin   Topical Daily   lidocaine  3 patch Transdermal Q24H   midodrine  15 mg Oral TID WC   polyethylene glycol  17 g Oral BID   senna-docusate  1 tablet Oral BID   sevelamer carbonate  4.8 g Oral TID WC   Warfarin - Pharmacist Dosing Inpatient   Does not apply q1600    Assessment: 1 YOM with pleural effusion. S/p chest tube placement and removal on 8/9. Was on warfarin PTA for history of afib which was resumed 8/9. Of note, patient was on amiodarone PTA.   Patient is not compliant with anticoagulation follow-ups, but last home regimen was warfarin 2 mg daily.   INR today 1.4 (no real  movement past 24 hours). No bleeding noted. Of note, pt is on Augmentin which may potentiate effects of warfarin.  Goal of Therapy:  INR 2-3 Monitor platelets by anticoagulation protocol: Yes   Plan:  Warfarin 3 mg x1 dose today Daily PT/INR   Christoper Fabian, PharmD, BCPS Please see amion for complete clinical pharmacist phone list 11/13/2022 2:48 PM

## 2022-11-13 NOTE — Plan of Care (Signed)

## 2022-11-13 NOTE — Progress Notes (Signed)
  [facility] Progress Note   Subjective:  Seen in room, no c/o's today.   Objective Vitals:   11/12/22 1929 11/13/22 0100 11/13/22 0500 11/13/22 0700  BP: 115/63 (!) 94/53 (!) 93/54 (!) 100/59  Pulse: (!) 59  (!) 56 60  Resp: 17 17 19 19   Temp: 97.7 F (36.5 C) 98 F (36.7 C) 98.1 F (36.7 C) 98.2 F (36.8 C)  TempSrc: Oral Oral  Oral  SpO2: 100%  97% 100%  Weight:      Height:       Physical Exam General: Chronically ill appearing man, NAD. Nasal O2 in place Heart: RRR; no murmur Lungs: chest tube removed, CTA Abdomen: soft Extremities: chronic woody changes without  edema +cachectic Dialysis Access: TDC  c/d/i  OP HD: TTS [facility] 4h  2/2 bath  66kg   TDC   no heparin (allergy)  lock TDC w/ citrate - No ESA or VDRA - Binder: Renvela pwdr 4.8g TID - Sensipar 180mg  every day at home   Assessment/Plan: Acute hypoxic respiratory failure: In setting of missed HD, pna w/ effusion. CXR with pleural effusion s/p chest tube 7/31. Resolved. PNA/ L parapneumonic pleural effusion: s/p thoracentesis with cultures. Got IV abx course, now on augmentin thru 8/14.  Afebrile.  CT out.  ESRD: usual TTS schedule. Next HD 8/13.  Heparin allergy - use citrate for cath lock Hypotension/ volume: BP low, LE edema resolved.  Is on midodrine. 4-5kg under dry wt, euvolemic now. Unable to pull fluid Sat due to low BPs, kept even. Last  CXR chronic changes.  Anemia of ESRD:  Hgb 9s - s/p Aranesp on 7/27.  Hb stable  Secondary HPTH: Ca/Phos ok - continue home binders.  Held cinacalcet due to hypoCa. Ca++ remains low, cont to hold cinacalcet.  A-fib: On amiodarone + warfarin (on hold for chest tube), has IVC filter Hx type B aortic dissection s/p repair (2004)     Corey Moselle  MD  [facility] 11/13/2022, 11:15 AM  Recent Labs  Lab 11/08/22 0332 11/09/22 0221 11/11/22 0008 11/12/22 0049  HGB 10.2* 9.8* 10.9* 10.7*  ALBUMIN 2.5* 2.4*  --   --   CALCIUM 7.7* 7.8* 8.2* 8.5*  PHOS  3.8 4.3  --   --   CREATININE 4.82* 5.52* 4.76* 6.26*  K 4.0 4.0 3.8 4.1    Inpatient medications:  (feeding supplement) PROSource Plus  30 mL Oral BID BM   amiodarone  200 mg Oral Daily   amoxicillin-clavulanate  1 tablet Oral QHS   Chlorhexidine Gluconate Cloth  6 each Topical Q0600   darbepoetin (ARANESP) injection - DIALYSIS  40 mcg Subcutaneous Q Sat-1800   hydrocerin   Topical Daily   lidocaine  3 patch Transdermal Q24H   midodrine  15 mg Oral TID WC   polyethylene glycol  17 g Oral BID   senna-docusate  1 tablet Oral BID   sevelamer carbonate  4.8 g Oral TID WC   Warfarin - Pharmacist Dosing Inpatient   Does not apply q1600    anticoagulant sodium citrate 100 mL/hr at 11/08/22 1735   acetaminophen, anticoagulant sodium citrate, calcium carbonate (dosed in mg elemental calcium), camphor-menthol **AND** hydrOXYzine, docusate sodium, HYDROmorphone (DILAUDID) injection, ondansetron **OR** ondansetron (ZOFRAN) IV, oxyCODONE, sorbitol, zolpidem

## 2022-11-13 NOTE — Progress Notes (Signed)
Inpatient Rehab Admissions Coordinator:  Spoke with pt's brother [name] on the telephone to discuss CIR goals and expectations. Discussed average average length of stay, insurance authorization requirement, discharge home after completion of CIR. He acknowledged understanding. He is supportive of pt pursuing CIR. He confirmed that he will be able to provide 24/7 support for pt after discharge. Will continue to follow.  Wolfgang Phoenix, MS, CCC-SLP Admissions Coordinator [phone]

## 2022-11-13 NOTE — Progress Notes (Signed)
PROGRESS NOTE    Corey [name]  XBM:[id number] DOB: 08/21/1970 DOA: 10/27/2022 PCP: [name], NP     Brief Narrative:  Corey [name] is a 52 y.o. male with medical history significant of end-stage renal disease on hemodialysis TTS, paroxysmal atrial fibrillation on chronic Coumadin therapy, history of previous aortic dissection, peripheral vascular disease, history of osteoarthritis, history of neuropathy, who missed hemodialysis twice secondary to nausea vomiting and diarrhea.  Patient came to the ER with shortness of breath. Was found to be hypoxic with new oxygen requirement.  Also significant hyperkalemia.  Nephrology was consulted and patient is being dialyzed.  As part of his workup patient was found to have complex left-sided pleural effusion that looked loculated.  Patient underwent chest tube placement by PCCM 7/31 and several rounds of tPA.  New events last 24 hours / Subjective: Admits to left-sided pain, this has been ongoing since 2004.  Assessment & Plan:   Principal Problem:   Pleural effusion Active Problems:   ESRD on dialysis [facility])   Essential hypertension   Chronic diastolic CHF (congestive heart failure) (HCC)   Atrial fibrillation, chronic (HCC)   End-stage renal disease on hemodialysis (HCC)   AAA (abdominal aortic aneurysm) without rupture (HCC)   Hyperkalemia   PVD (peripheral vascular disease) (HCC)   Constipation   Lobar pneumonia (HCC)   Parapneumonic effusion   Need for management of chest tube   Acute respiratory failure secondary to large loculated left-sided pleural effusion/parapneumonic effusion -Status post chest tube placement by [facility] 7/31 -Pleural fluid analysis consistent with exudative parapneumonic effusion -Status post tPA -Chest tube removed 8/9  -Rocephin, completed azithromycin --> Augmentin through 8/14   ESRD -HD TTS -Nephrology following  Hypotension -Status post IV albumin x 2, midodrine  Paroxysmal  A-fib -Amiodarone, coumadin   Chronic diastolic CHF -Volume management with HD  PVD -Outpatient follow-up  Constipation -Bowel regimen  Weakness -PT OT recommending CIR    DVT prophylaxis: Coumadin  Code Status: Full Family Communication: None at bedside  Disposition Plan: Home Status is: Inpatient Remains inpatient appropriate because: CIR recommended. Medically ready to go when approved.    Antimicrobials:  Anti-infectives (From admission, onward)    Start     Dose/Rate Route Frequency Ordered Stop   11/11/22 2200  amoxicillin-clavulanate (AUGMENTIN) 500-125 MG per tablet 1 tablet        1 tablet Oral Daily at bedtime 11/11/22 1316 11/17/22 2159   11/08/22 1800  cefTRIAXone (ROCEPHIN) 2 g in sodium chloride 0.9 % 100 mL IVPB  Status:  Discontinued        2 g 200 mL/hr over 30 Minutes Intravenous Every 24 hours 11/08/22 1656 11/11/22 1316   11/03/22 0915  cefTRIAXone (ROCEPHIN) 2 g in sodium chloride 0.9 % 100 mL IVPB        2 g 200 mL/hr over 30 Minutes Intravenous Every 24 hours 11/03/22 0823 11/08/22 1701   11/03/22 0915  azithromycin (ZITHROMAX) 500 mg in sodium chloride 0.9 % 250 mL IVPB        500 mg 250 mL/hr over 60 Minutes Intravenous Every 24 hours 11/03/22 0823 11/08/22 1701        Objective: Vitals:   11/12/22 1929 11/13/22 0100 11/13/22 0500 11/13/22 0700  BP: 115/63 (!) 94/53 (!) 93/54 (!) 100/59  Pulse: (!) 59  (!) 56 60  Resp: 17 17 19 19   Temp: 97.7 F (36.5 C) 98 F (36.7 C) 98.1 F (36.7 C) 98.2 F (36.8 C)  TempSrc: Oral Oral  Oral  SpO2: 100%  97% 100%  Weight:      Height:        Intake/Output Summary (Last 24 hours) at 11/13/2022 1051 Last data filed at 11/12/2022 1156 Gross per 24 hour  Intake --  Output 300 ml  Net -300 ml   Filed Weights   11/10/22 1400 11/12/22 0800 11/12/22 1156  Weight: 61.4 kg 61.6 kg 61.3 kg    Examination:  General exam: Appears calm and comfortable  Respiratory system: Clear to auscultation.  Respiratory effort normal. Without increase in WOB  Cardiovascular system: S1 & S2 heard, RRR. No murmurs. No pedal edema. Gastrointestinal system: Abdomen is nondistended, soft Central nervous system: Alert  Extremities: Symmetric in appearance  Skin: No rashes, lesions or ulcers on exposed skin   Data Reviewed: I have personally reviewed following labs and imaging studies  CBC: Recent Labs  Lab 11/07/22 0048 11/08/22 0332 11/09/22 0221 11/11/22 0008 11/12/22 0049  WBC 11.5* 10.7* 10.1 9.2 9.4  NEUTROABS 9.4*  --   --   --   --   HGB 10.1* 10.2* 9.8* 10.9* 10.7*  HCT 33.8* 34.5* 33.5* 37.5* 36.4*  MCV 98.0 98.6 97.7 96.6 97.1  PLT 233 187 169 143* 132*   Basic Metabolic Panel: Recent Labs  Lab 11/07/22 0048 11/08/22 0332 11/09/22 0221 11/11/22 0008 11/12/22 0049  NA 138 136 135 135 136  K 4.4 4.0 4.0 3.8 4.1  CL 98 96* 97* 93* 96*  CO2 24 27 25 26 23   GLUCOSE 100* 88 87 91 81  BUN 45* 23* 31* 28* 41*  CREATININE 7.36* 4.82* 5.52* 4.76* 6.26*  CALCIUM 7.5* 7.7* 7.8* 8.2* 8.5*  PHOS 4.0 3.8 4.3  --   --    GFR: Estimated Creatinine Clearance: 12 mL/min (A) (by C-G formula based on SCr of 6.26 mg/dL (H)). Liver Function Tests: Recent Labs  Lab 11/07/22 0048 11/08/22 0332 11/09/22 0221  ALBUMIN 2.6* 2.5* 2.4*   No results for input(s): "LIPASE", "AMYLASE" in the last 168 hours. No results for input(s): "AMMONIA" in the last 168 hours. Coagulation Profile: Recent Labs  Lab 11/08/22 0332 11/11/22 1120 11/12/22 0049 11/13/22 0059  INR 2.2* 1.2 1.4* 1.4*   Cardiac Enzymes: No results for input(s): "CKTOTAL", "CKMB", "CKMBINDEX", "TROPONINI" in the last 168 hours. BNP (last 3 results) No results for input(s): "PROBNP" in the last 8760 hours. HbA1C: No results for input(s): "HGBA1C" in the last 72 hours. CBG: No results for input(s): "GLUCAP" in the last 168 hours. Lipid Profile: No results for input(s): "CHOL", "HDL", "LDLCALC", "TRIG", "CHOLHDL",  "LDLDIRECT" in the last 72 hours. Thyroid Function Tests: No results for input(s): "TSH", "T4TOTAL", "FREET4", "T3FREE", "THYROIDAB" in the last 72 hours. Anemia Panel: No results for input(s): "VITAMINB12", "FOLATE", "FERRITIN", "TIBC", "IRON", "RETICCTPCT" in the last 72 hours. Sepsis Labs: No results for input(s): "PROCALCITON", "LATICACIDVEN" in the last 168 hours.  No results found for this or any previous visit (from the past 240 hour(s)).     Radiology Studies: DG CHEST PORT 1 VIEW  Result Date: 11/11/2022 CLINICAL DATA:  Chest tube removal. EXAM: PORTABLE CHEST 1 VIEW COMPARISON:  Chest radiograph dated 11/10/2022. FINDINGS: Right-sided catheter in similar position. There is cardiomegaly with vascular congestion and edema. Bibasilar atelectasis or infiltrate. No large pleural effusion. No pneumothorax. No acute osseous pathology. IMPRESSION: Cardiomegaly with vascular congestion and edema.  No pneumothorax. Electronically Signed   By: Elgie Collard M.D.   On: 11/11/2022 17:52  Scheduled Meds:  (feeding supplement) PROSource Plus  30 mL Oral BID BM   amiodarone  200 mg Oral Daily   amoxicillin-clavulanate  1 tablet Oral QHS   Chlorhexidine Gluconate Cloth  6 each Topical Q0600   darbepoetin (ARANESP) injection - DIALYSIS  40 mcg Subcutaneous Q Sat-1800   hydrocerin   Topical Daily   lidocaine  3 patch Transdermal Q24H   midodrine  15 mg Oral TID WC   polyethylene glycol  17 g Oral BID   senna-docusate  1 tablet Oral BID   sevelamer carbonate  4.8 g Oral TID WC   Warfarin - Pharmacist Dosing Inpatient   Does not apply q1600   Continuous Infusions:  anticoagulant sodium citrate 100 mL/hr at 11/08/22 1735     LOS: 17 days   Time spent: 25 minutes   Noralee Stain, DO [facility] 11/13/2022, 10:51 AM   Available via Epic secure chat 7am-7pm After these hours, please refer to coverage provider listed on [email or web address]

## 2022-11-13 NOTE — PMR Pre-admission (Signed)
PMR Admission Coordinator Pre-Admission Assessment  Patient: Corey [name] is an 52 y.o., male MRN: [id number] DOB: [birth year] Height: 6\' 3"  (190.5 cm) Weight: 58.6 kg (bed)  Insurance Information HMO: yes    PPO:      PCP:      IPA:      80/20:      OTHER:  PRIMARY: U[employer] Medicare      Policy#: [id number]      Subscriber: patient CM Name: Mordecai Maes      Phone#: [phone]     Fax#: [phone] Pre-Cert#: [id number] auth from Lennie with [facility] for admit 8/26 with updates due to fax listed above on 9/2.        Employer: Disabled Benefits:  Phone #: [email or web address]     Name:  Eff. Date: 04/04/22     Deduct: $240 ($240 met)      Out of Pocket Max: [phone] [id number] met)      Life Max: NA CIR: $1,730 co-pay/admission      SNF: 100% coverage days 1-20, $204 co-pay for days 21-100 Outpatient: 80% coverage     Co-Pay: 20% co-insurance Home Health: 100% coverage      Co-Pay:  DME: 80% coverage     Co-Pay: 20% co-insurance Providers: in-network  SECONDARY: Medicaid [place]      Policy#: [id number]     Phone#: [phone]  Financial Counselor:       Phone#:   The "Data Collection Information Summary" for patients in Inpatient Rehabilitation Facilities with attached "Privacy Act Statement-Health Care Records" was provided and verbally reviewed with: Patient  Emergency Contact Information Contact Information     Name Relation Home Work Mobile   Coulston,Timothy Brother   [phone]   Theressa Stamps [phone]  [phone]      Other Contacts   None on File     Current Medical History  Patient Admitting Diagnosis: AKA, debility s/p pleural effusion History of Present Illness: Pt is a 52 y.o. male with past medical history of ESRD (HD TTS), paroxysmal atrial fibrillation on chronic Coumadin therapy, history of previous aortic dissection, peripheral vascular disease, history of osteoarthritis, history of neuropathy, who missed hemodialysis twice secondary to  nausea vomiting and diarrhea.  Patient came to the [facility] ED with shortness of breath 10/27/22. Was found to be hypoxic with new oxygen requirement.  Also significant hyperkalemia.  Nephrology was consulted and pt. Was dialyzed.  As part of his workup patient was found to have complex right-sided pleural effusion that looks loculated.  There is worried that this represents possible infection versus other loculated pleural effusion. S/p chest tube placement 7/31; removed 8/9. Found to have L heel necrosis 8/12 and underwent  L AKA with vascular surgery on 11/17/22. Pt. Was seen by PT/OT and they recommend CIR to assist return to PLOF.     Patient's medical record from [facility] has been reviewed by the rehabilitation admission coordinator and physician.  Past Medical History  Past Medical History:  Diagnosis Date   Arthritis    hands and shoulders   Blindness and low vision    "Stargardt disease"   Dissection of aorta (HCC) 2004   a. s/p extensive repeair in 2004 in [place] complicated by ESRD, lower extremity paralysis, coma, and extended hospitalization of 2 years   ESRD (end stage renal disease) (HCC)    a. TTS   Headache    History of cardioversion 2014   Hypertension    Neuropathy  Non-healing non-surgical wound 03/2016   PAF (paroxysmal atrial fibrillation) (HCC)    a. s/p DCCV in 2014; b. on Coumadin; c. CHADS2VASc => 2 (HTN, vascular disease)   Paralysis (HCC)    due to dissection of aorta in 2004, lower extremities   Pneumonia     Has the patient had major surgery during 100 days prior to admission? Yes  Family History   family history includes Cancer in his father and mother; Hypertension in his father and mother; Stroke in his brother; Thyroid disease in his sister.  Current Medications  Current Facility-Administered Medications:    acetaminophen (TYLENOL) tablet 650 mg, 650 mg, Oral, Q6H WA, Alanda Slim, Taye T, MD, 650 mg at 11/29/22 0112    albumin human 25 % solution 25 g, 25 g, Intravenous, Once, Delano Metz, MD   amiodarone (PACERONE) tablet 200 mg, 200 mg, Oral, Daily, Rhyne, Samantha J, PA-C, 200 mg at 11/28/22 2952   anticoagulant sodium citrate solution 5 mL, 5 mL, Intracatheter, Continuous PRN, Rhyne, Samantha J, PA-C, Last Rate: 100 mL/hr at 11/18/22 1400, 5 mL at 11/19/22 1217   apixaban (ELIQUIS) tablet 2.5 mg, 2.5 mg, Oral, BID, Alanda Slim, Taye T, MD, 2.5 mg at 11/28/22 2037   calcium carbonate (dosed in mg elemental calcium) suspension 500 mg of elemental calcium, 500 mg of elemental calcium, Oral, Q6H PRN, Rhyne, Samantha J, PA-C   camphor-menthol (SARNA) lotion 1 Application, 1 Application, Topical, Q8H PRN **AND** hydrOXYzine (ATARAX) tablet 25 mg, 25 mg, Oral, Q8H PRN, Rhyne, Samantha J, PA-C   Chlorhexidine Gluconate Cloth 2 % PADS 6 each, 6 each, Topical, Q0600, Delano Metz, MD, 6 each at 11/29/22 0622   Chlorhexidine Gluconate Cloth 2 % PADS 6 each, 6 each, Topical, Q0600, Estanislado Emms, MD, 6 each at 11/29/22 0622   [START ON 12/03/2022] Darbepoetin Alfa (ARANESP) injection 100 mcg, 100 mcg, Subcutaneous, Q Sat-1800, Vallery Sa C, MD   docusate sodium (COLACE) capsule 100 mg, 100 mg, Oral, Daily, Rhyne, Samantha J, PA-C, 100 mg at 11/27/22 0946   docusate sodium (ENEMEEZ) enema 283 mg, 1 enema, Rectal, PRN, Rhyne, Samantha J, PA-C   feeding supplement (ENSURE ENLIVE / ENSURE PLUS) liquid 237 mL, 237 mL, Oral, BID BM, Gonfa, Taye T, MD, 237 mL at 11/28/22 1344   gabapentin (NEURONTIN) capsule 100 mg, 100 mg, Oral, BID, Alanda Slim, Taye T, MD, 100 mg at 11/28/22 2038   hydrocerin (EUCERIN) cream, , Topical, Daily, Rhyne, Samantha J, PA-C, Given at 11/28/22 0933   HYDROmorphone (DILAUDID) injection 0.5 mg, 0.5 mg, Intravenous, Q3H PRN, Alanda Slim, Taye T, MD, 0.5 mg at 11/25/22 2200   lidocaine (LIDODERM) 5 % 3 patch, 3 patch, Transdermal, Q24H, Rhyne, Samantha J, PA-C, 3 patch at 11/28/22 1635   methocarbamol (ROBAXIN)  tablet 500 mg, 500 mg, Oral, Q6H PRN, Renford Dills, Amrit, MD, 500 mg at 11/29/22 0600   midodrine (PROAMATINE) tablet 15 mg, 15 mg, Oral, TID WC, Rhyne, Samantha J, PA-C, 15 mg at 11/29/22 0600   multivitamin (RENA-VIT) tablet 1 tablet, 1 tablet, Oral, QHS, Rhyne, Samantha J, PA-C, 1 tablet at 11/28/22 2038   ondansetron (ZOFRAN) tablet 4 mg, 4 mg, Oral, Q6H PRN **OR** ondansetron (ZOFRAN) injection 4 mg, 4 mg, Intravenous, Q6H PRN, Rhyne, Samantha J, PA-C, 4 mg at 11/11/22 1309   oxyCODONE (Oxy IR/ROXICODONE) immediate release tablet 10 mg, 10 mg, Oral, Q6H PRN, Renford Dills, Amrit, MD, 10 mg at 11/26/22 1958   phenol (CHLORASEPTIC) mouth spray 1 spray, 1 spray, Mouth/Throat, PRN, Rhyne, Lelon Mast  J, PA-C   polyethylene glycol (MIRALAX / GLYCOLAX) packet 17 g, 17 g, Oral, BID, Rhyne, Samantha J, PA-C, 17 g at 11/27/22 0946   senna-docusate (Senokot-S) tablet 1 tablet, 1 tablet, Oral, BID, Rhyne, Samantha J, PA-C, 1 tablet at 11/27/22 0946   sevelamer carbonate (RENVELA) powder PACK 4.8 g, 4.8 g, Oral, TID WC, Gonfa, Taye T, MD, 4.8 g at 11/28/22 0610   sorbitol 70 % solution 30 mL, 30 mL, Oral, PRN, Rhyne, Samantha J, PA-C, 30 mL at 11/27/22 0759   zolpidem (AMBIEN) tablet 5 mg, 5 mg, Oral, QHS PRN, Rhyne, Samantha J, PA-C, 5 mg at 11/28/22 2042  Patients Current Diet:  Diet Order             Diet - low sodium heart healthy           Diet regular Room service appropriate? Yes; Fluid consistency: Thin; Fluid restriction: 1200 mL Fluid  Diet effective now                   Precautions / Restrictions Precautions Precautions: Fall, Other (comment) Precaution Comments: s/p L AKA 11/17/22; watch soft BP; low vision Restrictions Weight Bearing Restrictions: Yes LLE Weight Bearing: Non weight bearing (AKA) Other Position/Activity Restrictions: offload R heel   Has the patient had 2 or more falls or a fall with injury in the past year? No  Prior Activity Level Limited Community (1-2x/wk): goes  to dialysis 3 days/week  Prior Functional Level Self Care: Did the patient need help bathing, dressing, using the toilet or eating? Independent  Indoor Mobility: Did the patient need assistance with walking from room to room (with or without device)? NA, pt paralyzed  Stairs: Did the patient need assistance with internal or external stairs (with or without device)? Pt wheelchair bound  Functional Cognition: Did the patient need help planning regular tasks such as shopping or remembering to take medications? Independent  Patient Information Are you of Hispanic, Latino/a,or Spanish origin?: A. No, not of Hispanic, Latino/a, or Spanish origin What is your race?: B. Black or African American Do you need or want an interpreter to communicate with a doctor or health care staff?: 0. No  Patient's Response To:  Health Literacy and Transportation Is the patient able to respond to health literacy and transportation needs?: Yes Health Literacy - How often do you need to have someone help you when you read instructions, pamphlets, or other written material from your doctor or pharmacy?: Always In the past 12 months, has lack of transportation kept you from medical appointments or from getting medications?: Yes (brother reported pt hasn't missed any medical appointments due to transportation issues) In the past 12 months, has lack of transportation kept you from meetings, work, or from getting things needed for daily living?: Yes (brother reported pt hasn't missed any non-medical appointments due to transportation issues.)  Journalist, newspaper / Equipment Home Assistive Devices/Equipment: Wheelchair Home Equipment: BSC/3in1, Wheelchair - manual  Prior Device Use: Indicate devices/aids used by the patient prior to current illness, exacerbation or injury? Manual wheelchair  Current Functional Level Cognition  Overall Cognitive Status: Within Functional Limits for tasks assessed Orientation Level:  Oriented X4 General Comments: Pt AAOx4 and pleasant throughout session.    Extremity Assessment (includes Sensation/Coordination)  Upper Extremity Assessment: Generalized weakness  Lower Extremity Assessment: Defer to PT evaluation RLE Deficits / Details: h/o R knee flexion and hip ER contracture; hip and knee >/ 3/5; notable lower leg skin changes, flakiness RLE:  Unable to fully assess due to pain LLE Deficits / Details: s/p L AKA 11/17/22 LLE: Unable to fully assess due to pain    ADLs  Overall ADL's : Needs assistance/impaired Eating/Feeding: Modified independent, Sitting Eating/Feeding Details (indicate cue type and reason): Orientation secondary to low vision Grooming: Modified independent, Sitting Grooming Details (indicate cue type and reason): Orientation secondary to low vision Upper Body Bathing: Modified independent, Sitting Lower Body Bathing: Contact guard assist, Sitting/lateral leans, Cueing for compensatory techniques Lower Body Bathing Details (indicate cue type and reason): for thoroughness Upper Body Dressing : Modified independent, Sitting Lower Body Dressing: Moderate assistance, Sitting/lateral leans Lower Body Dressing Details (indicate cue type and reason): donning socks, poor skin integrity and painful Toilet Transfer: Minimal assistance, Transfer board, Requires drop arm Toilet Transfer Details (indicate cue type and reason): min A for lateral scoot transfer to recliner Toileting- Clothing Manipulation and Hygiene: Minimal assistance, Sitting/lateral lean Toileting - Clothing Manipulation Details (indicate cue type and reason): for thoroughness Functional mobility during ADLs: Minimal assistance General ADL Comments: Pt with decreased activity tolerance. Pt requires orientation to environment when items are moved in his room secondary to low vision.    Mobility  Overal bed mobility: Needs Assistance Bed Mobility: Supine to Sit Rolling: Supervision Sidelying  to sit: Min assist Supine to sit: Min assist Sit to supine: Supervision, Used rails Sit to sidelying: Contact guard assist General bed mobility comments: pt in chair upon PT arrival    Transfers  Overall transfer level: Needs assistance Equipment used: None Transfers: Bed to chair/wheelchair/BSC Bed to/from chair/wheelchair/BSC transfer type:: Lateral/scoot transfer  Lateral/Scoot Transfers: Min assist General transfer comment: pt deferred going back to bed due to wanting to eat lunch in chair    Ambulation / Gait / Stairs / Wheelchair Mobility  Ambulation/Gait General Gait Details: unable    Posture / Balance Dynamic Sitting Balance Sitting balance - Comments: sitting in recliner, uses UEs to support self when doing R LE exercises Balance Overall balance assessment: Needs assistance Sitting-balance support: Single extremity supported, Bilateral upper extremity supported, Feet supported Sitting balance-Leahy Scale: Fair Sitting balance - Comments: sitting in recliner, uses UEs to support self when doing R LE exercises    Special needs/care consideration Dialysis: Hemodialysis Tuesday, Thursday, and Saturday, Oxygen 4L nasal cannula, and Skin Wound: toe/right; necrotic foot/left   Previous Home Environment (from acute therapy documentation) Living Arrangements: Other relatives (brother)  Lives With: Family (brother) Available Help at Discharge: Family, Available 24 hours/day Type of Home: House Home Layout: One level Home Access: Ramped entrance Bathroom Shower/Tub: Tub/shower unit, Sponge bathes at baseline Bathroom Toilet: Standard Bathroom Accessibility: Yes How Accessible: Accessible via wheelchair Home Care Services: No  Discharge Living Setting Plans for Discharge Living Setting: Patient's home Type of Home at Discharge: House Discharge Home Layout: One level Discharge Home Access: Ramped entrance Discharge Bathroom Shower/Tub: Tub/shower unit Discharge Bathroom  Toilet: Standard (has a seat to make it handicap accessible) Discharge Bathroom Accessibility: Yes How Accessible: Accessible via wheelchair Does the patient have any problems obtaining your medications?: No  Social/Family/Support Systems Anticipated Caregiver: Jequan Landon, brother Anticipated Caregiver's Contact Information: [phone] Ability/Limitations of Caregiver: can provide supervision to possibly Min A Caregiver Availability: 24/7 Discharge Plan Discussed with Primary Caregiver: Yes Is Caregiver In Agreement with Plan?: Yes Does Caregiver/Family have Issues with Lodging/Transportation while Pt is in Rehab?: No  Goals Patient/Family Goal for Rehab: Supervision: PT/OT Expected length of stay: 12-14 days Pt/Family Agrees to Admission and willing to participate: Yes  Program Orientation Provided & Reviewed with Pt/Caregiver Including Roles  & Responsibilities: Yes  Decrease burden of Care through IP rehab admission: NA  Possible need for SNF placement upon discharge: Not anticipated   Patient Condition: I have reviewed medical records from [facility], spoken with CSW, and patient and family member. I met with patient at the bedside and discussed via phone for inpatient rehabilitation assessment.  Patient will benefit from ongoing PT and OT, can actively participate in 3 hours of therapy a day 5 days of the week, and can make measurable gains during the admission.  Patient will also benefit from the coordinated team approach during an Inpatient Acute Rehabilitation admission.  The patient will receive intensive therapy as well as Rehabilitation physician, nursing, social worker, and care management interventions.  Due to safety, skin/wound care, disease management, medication administration, pain management, and patient education the patient requires 24 hour a day rehabilitation nursing.  The patient is currently min assist with mobility and basic ADLs.  Discharge setting and  therapy post discharge at home with home health is anticipated.  Patient has agreed to participate in the Acute Inpatient Rehabilitation Program and will admit today.  Preadmission Screen Completed By:  Domingo Pulse, 11/29/2022 10:35 AM ______________________________________________________________________   Discussed status with Dr. Berline Chough on 11/29/22  at 10:35 AM  and received approval for admission today.  Admission Coordinator:  Domingo Pulse, CCC-SLP, time 10:35 AM Dorna Bloom  11/29/22    Assessment/Plan: Diagnosis: L AKA Does the need for close, 24 hr/day Medical supervision in concert with the patient's rehab needs make it unreasonable for this patient to be served in a less intensive setting? Yes Co-Morbidities requiring supervision/potential complications: ESRd on HD, blindness; Afib on Coumadin; PAD; severe malnutrition; s/p chest tube for pleural effusion; chronic hypotension on max dose midodrine Due to bowel management, safety, skin/wound care, disease management, medication administration, pain management, and patient education, does the patient require 24 hr/day rehab nursing? Yes Does the patient require coordinated care of a physician, rehab nurse, PT, OT, and SLP to address physical and functional deficits in the context of the above medical diagnosis(es)? Yes Addressing deficits in the following areas: balance, endurance, locomotion, strength, transferring, bowel/bladder control, bathing, dressing, feeding, grooming, and toileting Can the patient actively participate in an intensive therapy program of at least 3 hrs of therapy 5 days a week? Yes The potential for patient to make measurable gains while on inpatient rehab is good Anticipated functional outcomes upon discharge from inpatient rehab: supervision PT, supervision OT, n/a SLP Estimated rehab length of stay to reach the above functional goals is: 12-14 days Anticipated discharge destination: Home 10.  Overall Rehab/Functional Prognosis: good   MD Signature:

## 2022-11-14 ENCOUNTER — Inpatient Hospital Stay (HOSPITAL_COMMUNITY): Payer: 59

## 2022-11-14 DIAGNOSIS — J9601 Acute respiratory failure with hypoxia: Secondary | ICD-10-CM

## 2022-11-14 DIAGNOSIS — I96 Gangrene, not elsewhere classified: Secondary | ICD-10-CM | POA: Insufficient documentation

## 2022-11-14 LAB — PHOSPHORUS: Phosphorus: 3.6 mg/dL (ref 2.5–4.6)

## 2022-11-14 LAB — ALBUMIN: Albumin: 2.4 g/dL — ABNORMAL LOW (ref 3.5–5.0)

## 2022-11-14 MED ORDER — WARFARIN SODIUM 3 MG PO TABS
3.0000 mg | ORAL_TABLET | Freq: Once | ORAL | Status: AC
  Administered 2022-11-14: 3 mg via ORAL
  Filled 2022-11-14: qty 1

## 2022-11-14 MED ORDER — IOHEXOL 350 MG/ML SOLN
75.0000 mL | Freq: Once | INTRAVENOUS | Status: AC | PRN
  Administered 2022-11-14: 75 mL via INTRAVENOUS

## 2022-11-14 NOTE — Plan of Care (Signed)
  Problem: Clinical Measurements: Goal: Respiratory complications will improve Outcome: Progressing Goal: Cardiovascular complication will be avoided Outcome: Progressing   Problem: Pain Managment: Goal: General experience of comfort will improve Outcome: Progressing   Problem: Safety: Goal: Ability to remain free from injury will improve Outcome: Progressing   

## 2022-11-14 NOTE — Consult Note (Signed)
WOC Nurse Consult Note: Consult for WOC nursing was discontinued by Dr. Butler Denmark as she has consulted Vascular Surgery. No role for WOC nursing at this time.  WOC nursing team did not see and will not follow, but will remain available to this patient, the nursing and medical teams.  Please re-consult if needed.   Ladona Mow, MSN, RN, CNS, GNP, Leda Min, [employer], [employer] phone:  [phone]

## 2022-11-14 NOTE — Progress Notes (Addendum)
[facility] Progress Note  Patient: Corey [name]     OVF:[id number]  DOA: 10/27/2022   PCP: [name], NP       Brief hospital course: This is a 52 year old male with numerous medical issues including end-stage renal disease, calciphylaxis, paroxysmal atrial fibrillation, type B aortic dissection, peripheral artery disease, chronic lower extremity edema who is malnourished, legally blind and non ambulatory.  He presented to the hospital with shortness of breath and several days of left shoulder, chest and left flank pain. Because of this pain, he had missed 2 dialysis treatments.  EMS discovered that his pulse ox was 85% on room air. In the ED,  CT imaging of chest, abdomen and pelvis revealed a moderate to large loculated left-sided pleural effusion with lower lobe consolidation.  7/26> Pulmonary consult was requested- INR noted to be 3.6 and therefore no intervention to drain effusion was pursued 7/29> INR 2.5 - vitamin K given 7/31>  chest tube placed and serosanguineous fluid drained - felt to be parapneumonic.  The patient was treated with Ceftriaxone and Azithromycin and required multiple rounds of lytics.  8/9 Chest tube removed   He has been transitioned to Augmentin and has stabilized.  PT has recommended CIR. On exam today I have discovered that he has left heel necrosis and have requested a vascular surgery consult.  Vascular surgery has recommended a left AKA but the patient has declined this.  Subjective:  Patient has had a poor appetite.  He feels weak but overall has no complaints.  His brother and 2 cousins are at his bedside and are asking for details regarding his prolonged hospitalization and ongoing weakness.  Assessment and Plan: Principal Problem:   Acute respiratory failure with hypoxia  -Multifactorial and due to left lower lobe pneumonia, parapneumonic effusion and fluid overload - treated with chest tube, antibiotics and fluid removal with  dialysis - Currently receiving Augmentin - end date 8/14  Active Problems:   ESRD on dialysis  Chronic systolic and diastolic heart failure -He receives dialysis on TTS schedule - per nephrology notes, he often skips Tuesday dialysis treatments as outpatient -Appreciate nephrology assistance- next HD tomorrow  Gangrene of left heel Peripheral artery disease -Noted on exam today to have gangrene of left heel - Vascular surgery consult appreciated - AKA recommended by vascular surgery, preferably prior to discharge to CIR, however, the patient is not in agreement  Chronic pedal edema/ Venous stasis Calciphylaxis of LE - Darkened &  hyperkeratotic skin on legs and feet which is noted to be peeling in some areas - also has changes consistent with Calciphylaxis on legs - cont Eucerin and Sarna lotion on legs - Currently has no edema - likely has been adequately diuresed and legs have not been in a dependent position (typically is  in a wheelchair)  Mass in left subclavicular region- Sebaceous cyst?  -Noted on exam today - According to the patient and family, this mass is new, however, according to the CT scan that was performed today, the mass has been present since 2021 and is unchanged  Paroxysmal atrial fibrillation - Continue amiodarone and warfarin -Appreciate pharmacy management -INR is 1.4 today  Chronic hypotension - s/p albumin infusions x 2 -Continue midodrine  Poor appetite -malnourishted- underweight  - BMI16.89 kg/m - have encouraged the patient to take protein supplements which he has been intermittently declining   Type B thoracic aortic dissection with aneurysmal dilatation of the proximal descending thoracic aorta up to 4.5 cm  in diameter. Anemia of chronic disease Legally blind Bedbound/wheelchair-bound Disposition:  CIR recommended by PT  I have had an extensive conversation today with patient and the family in the presence of his RN. I have answered all  questions and explained the plan in detail.     Code Status: Full Code Consultants: Nephrology, pulmonary, vascular surgery Level of Care:  Telemetry Cardiac Total time on patient care: 50 min   Objective:   Vitals:   11/14/22 1122 11/14/22 1200 11/14/22 1400 11/14/22 1546  BP: (!) 94/56 106/66 (!) 144/63 (!) 97/59  Pulse: 62 64 65 60  Resp: 17 (!) 27 20 17   Temp: 98.2 F (36.8 C)   98 F (36.7 C)  TempSrc: Oral   Oral  SpO2: 99% 100% 97% 97%  Weight:      Height:       Filed Weights   11/10/22 1400 11/12/22 0800 11/12/22 1156  Weight: 61.4 kg 61.6 kg 61.3 kg   Exam: General exam: Appears comfortable  HEENT: oral mucosa moist- poor visual acuity Respiratory system: coarse breath sounds bilaterally Cardiovascular system: RRR, S1 & S2 heard  Chest: mass on left chest wall under clavicle measuring about 4x4 inches, firm, mobile and non-tender with a central punctum  Gastrointestinal system: Abdomen soft, non-tender, nondistended. Normal bowel sounds   Extremities: dark,hyperkeratotic, dry, flaking skin- fluctuant necrosis of left heel with foul smell- non- tender Calciphylaxis noted on LE   CBC: Recent Labs  Lab 11/08/22 0332 11/09/22 0221 11/11/22 0008 11/12/22 0049 11/14/22 0038  WBC 10.7* 10.1 9.2 9.4 11.7*  HGB 10.2* 9.8* 10.9* 10.7* 9.7*  HCT 34.5* 33.5* 37.5* 36.4* 32.7*  MCV 98.6 97.7 96.6 97.1 98.8  PLT 187 169 143* 132* 144*   Basic Metabolic Panel: Recent Labs  Lab 11/08/22 0332 11/09/22 0221 11/11/22 0008 11/12/22 0049 11/14/22 0038  NA 136 135 135 136 133*  K 4.0 4.0 3.8 4.1 3.9  CL 96* 97* 93* 96* 96*  CO2 27 25 26 23 27   GLUCOSE 88 87 91 81 101*  BUN 23* 31* 28* 41* 32*  CREATININE 4.82* 5.52* 4.76* 6.26* 5.28*  CALCIUM 7.7* 7.8* 8.2* 8.5* 8.3*  PHOS 3.8 4.3  --   --  3.6      Scheduled Meds:  (feeding supplement) PROSource Plus  30 mL Oral BID BM   amiodarone  200 mg Oral Daily   amoxicillin-clavulanate  1 tablet Oral QHS    Chlorhexidine Gluconate Cloth  6 each Topical Q0600   darbepoetin (ARANESP) injection - DIALYSIS  40 mcg Subcutaneous Q Sat-1800   hydrocerin   Topical Daily   lidocaine  3 patch Transdermal Q24H   midodrine  15 mg Oral TID WC   polyethylene glycol  17 g Oral BID   senna-docusate  1 tablet Oral BID   sevelamer carbonate  4.8 g Oral TID WC   Warfarin - Pharmacist Dosing Inpatient   Does not apply q1600   Continuous Infusions:  anticoagulant sodium citrate 100 mL/hr at 11/08/22 1735   Imaging and lab data was personally reviewed   LOS: 18 days   Author: Calvert Cantor  11/14/2022 9:06 PM  To contact [facility]>   Check the care team in [facility] and look for the attending/consulting [facility] provider listed  Log into [email or web address] and use [place]'s universal password   Go to> "[facility]"  and find provider  If you still have difficulty reaching the provider, please page the [facility] (Director on Call) for  the Hospitalists listed on amion

## 2022-11-14 NOTE — Progress Notes (Signed)
ANTICOAGULATION CONSULT NOTE - Follow Up Consult  Pharmacy Consult for warfarin Indication: atrial fibrillation  Allergies  Allergen Reactions   Ciprofloxacin Other (See Comments)    Aortic dissection   Heparin Other (See Comments)    UNSPECIFIED REACTION :  On Coumadin since 2004   HIT panel negative 01/19/17   Doxercalciferol Other (See Comments)   Quinolones Other (See Comments)    unknown    Patient Measurements: Height: 6\' 3"  (190.5 cm) Weight: 61.3 kg (135 lb 2.3 oz) IBW/kg (Calculated) : 84.5  Vital Signs: Temp: 98.2 F (36.8 C) (08/12 1122) Temp Source: Oral (08/12 1122) BP: 94/56 (08/12 1122) Pulse Rate: 62 (08/12 1122)  Labs: Recent Labs    11/12/22 0049 11/13/22 0059 11/14/22 0038  HGB 10.7*  --  9.7*  HCT 36.4*  --  32.7*  PLT 132*  --  144*  LABPROT 17.1* 16.9* 17.8*  INR 1.4* 1.4* 1.4*  CREATININE 6.26*  --  5.28*    Estimated Creatinine Clearance: 14.2 mL/min (A) (by C-G formula based on SCr of 5.28 mg/dL (H)).   Medications:  Scheduled:   (feeding supplement) PROSource Plus  30 mL Oral BID BM   amiodarone  200 mg Oral Daily   amoxicillin-clavulanate  1 tablet Oral QHS   Chlorhexidine Gluconate Cloth  6 each Topical Q0600   darbepoetin (ARANESP) injection - DIALYSIS  40 mcg Subcutaneous Q Sat-1800   hydrocerin   Topical Daily   lidocaine  3 patch Transdermal Q24H   midodrine  15 mg Oral TID WC   polyethylene glycol  17 g Oral BID   senna-docusate  1 tablet Oral BID   sevelamer carbonate  4.8 g Oral TID WC   Warfarin - Pharmacist Dosing Inpatient   Does not apply q1600    Assessment: 69 YOM with pleural effusion. S/p chest tube placement and removal on 8/9. Was on warfarin PTA for history of afib which was resumed 8/9. Of note, patient was on amiodarone PTA.   Patient is not compliant with anticoagulation follow-ups, but last home regimen was warfarin 2 mg daily.   INR today 1.4 (no real movement past 72 hours).  Of note, pt is on  Augmentin which may potentiate effects of warfarin.  Goal of Therapy:  INR 2-3 Monitor platelets by anticoagulation protocol: Yes   Plan:  Warfarin 3 mg x1 dose today Daily PT/INR   Harland German, PharmD Clinical Pharmacist **Pharmacist phone directory can now be found on [email or web address] ([email or web address]).  Listed under [facility].

## 2022-11-14 NOTE — Plan of Care (Signed)
  Problem: Coping: Goal: Level of anxiety will decrease Outcome: Progressing   Problem: Pain Managment: Goal: General experience of comfort will improve Outcome: Progressing   Problem: Safety: Goal: Ability to remain free from injury will improve Outcome: Progressing   

## 2022-11-14 NOTE — Progress Notes (Signed)
Inpatient Rehab Admissions Coordinator:  Saw pt at bedside. Informed him that began insurance authorization. Will continue to follow.   Wolfgang Phoenix, MS, CCC-SLP Admissions Coordinator [phone]

## 2022-11-14 NOTE — Progress Notes (Signed)
Occupational Therapy Treatment Patient Details Name: Corey [name] MRN: [id number] DOB: 1970-10-14 Today's Date: 11/14/2022   History of present illness 52 year old man admitted 7/25 with left pleural effusion.  He has had general decline in the state of health over the last several months with loss of appetite. Pt with large left loculated pleural effusion with CT placed 7/31.  tPa 8/3.  PMH:  ESRD, T,Th, Sat HD; paralysis bil LEs after aortic dissection, blindness   OT comments  Patient continues to make steady progress towards goals in skilled OT session. Patient's session encompassed bed mobility and ADLs as patient's blood pressure was low for entirety of session with confirmed lightheaded feeling by patient. Patient contact guard for bed mobility, and set up for basic ADLs, however requires increased assist for lower body ADLs. OT continues to highly recommend intensive rehab greater than 3 hours at discharge. OT will continue to follow.   BP in supine: 85/58 (66) BP sitting EOB: 73/48 (58) BP in supine after returning to EOB: 76/44 (53)       If plan is discharge home, recommend the following:  A little help with walking and/or transfers;A little help with bathing/dressing/bathroom;Assistance with cooking/housework;Direct supervision/assist for medications management;Direct supervision/assist for financial management;Assist for transportation;Help with stairs or ramp for entrance   Equipment Recommendations  Other (comment) (defer to next venue)    Recommendations for Other Services      Precautions / Restrictions Precautions Precautions: Fall Restrictions Weight Bearing Restrictions: No       Mobility Bed Mobility Overal bed mobility: Needs Assistance Bed Mobility: Sit to Sidelying, Sidelying to Sit   Sidelying to sit: Contact guard assist     Sit to sidelying: Contact guard assist General bed mobility comments: Pt received in R sidelying, pt utilizing bed rails to  elevate trunk. Patient reporting increased lightheadedness when sitting EOB with BP assessed at 73/48 (58). Patient able to return BLEs into bed without assist.    Transfers                   General transfer comment: unable to attempt due to BP levels     Balance Overall balance assessment: Needs assistance Sitting-balance support: Feet supported, No upper extremity supported Sitting balance-Leahy Scale: Fair                                     ADL either performed or assessed with clinical judgement   ADL Overall ADL's : Needs assistance/impaired Eating/Feeding: Set up;Sitting   Grooming: Set up;Sitting                               Functional mobility during ADLs: Moderate assistance;Cueing for sequencing;Cueing for safety General ADL Comments: Session limited by low BPs when attempting functional activities, however motivated to progress to return to prior level of function.    Extremity/Trunk Assessment              Vision       Perception     Praxis      Cognition Arousal: Alert Behavior During Therapy: Flat affect Overall Cognitive Status: Within Functional Limits for tasks assessed  Exercises      Shoulder Instructions       General Comments      Pertinent Vitals/ Pain       Pain Assessment Pain Assessment: Faces Faces Pain Scale: Hurts little more Pain Location: L flank Pain Descriptors / Indicators: Discomfort, Guarding, Grimacing  Home Living                                          Prior Functioning/Environment              Frequency  Min 2X/week        Progress Toward Goals  OT Goals(current goals can now be found in the care plan section)  Progress towards OT goals: Progressing toward goals  Acute Rehab OT Goals Patient Stated Goal: to get to go home OT Goal Formulation: With patient Time For Goal  Achievement: 11/25/22 Potential to Achieve Goals: Good  Plan      Co-evaluation                 AM-PAC OT "6 Clicks" Daily Activity     Outcome Measure   Help from another person eating meals?: None Help from another person taking care of personal grooming?: None Help from another person toileting, which includes using toliet, bedpan, or urinal?: A Little Help from another person bathing (including washing, rinsing, drying)?: A Lot Help from another person to put on and taking off regular upper body clothing?: A Little Help from another person to put on and taking off regular lower body clothing?: A Lot 6 Click Score: 18    End of Session Equipment Utilized During Treatment: Oxygen  OT Visit Diagnosis: Unsteadiness on feet (R26.81);Other abnormalities of gait and mobility (R26.89);Muscle weakness (generalized) (M62.81);Low vision, both eyes (H54.2);Pain;Adult, failure to thrive (R62.7)   Activity Tolerance Treatment limited secondary to medical complications (Comment) (Low BP)   Patient Left in bed;with call bell/phone within reach;with bed alarm set   Nurse Communication Mobility status        Time: 1610-9604 OT Time Calculation (min): 21 min  Charges: OT General Charges $OT Visit: 1 Visit OT Treatments $Self Care/Home Management : 8-22 mins  Pollyann Glen E. , OTR/L Acute Rehabilitation Services [phone]   Cherlyn Cushing 11/14/2022, 9:34 AM

## 2022-11-14 NOTE — Progress Notes (Signed)
  [facility] Progress Note   Subjective:  Some SOB but overall improved. No other complaints.  Objective Vitals:   11/13/22 2300 11/14/22 0200 11/14/22 0600 11/14/22 0737  BP: 105/61 99/68 (!) 91/56 111/67  Pulse: 61  65 60  Resp: 18  19 18   Temp: 98.2 F (36.8 C)  98 F (36.7 C) 98.6 F (37 C)  TempSrc: Oral  Oral Oral  SpO2: 99%  97% 96%  Weight:      Height:       Physical Exam General: lying in bed, chronically ill appearing, no distress Heart: no rub, normal rate Lungs: bilateral chest rise, no iwob Abdomen: soft Extremities: bilateral venous stasis changes, no edema Dialysis Access: TDC  c/d/i  OP HD: TTS [facility] 4h  2/2 bath  66kg   TDC   no heparin (allergy)  lock TDC w/ citrate - No ESA or VDRA - Binder: Renvela pwdr 4.8g TID - Sensipar 180mg  every day at home   Assessment/Plan: Acute hypoxic respiratory failure: In setting of missed HD, pna w/ effusion. CXR with pleural effusion s/p chest tube 7/31. Resolved. PNA/ L parapneumonic pleural effusion: s/p thoracentesis with cultures. Got IV abx course, now on augmentin thru 8/14.  Afebrile ESRD: usual TTS schedule. Next HD 8/13.  Heparin allergy - use citrate for cath lock Hypotension/ volume: BP low, LE edema resolved.  Is on midodrine. 4-5kg under dry wt. Anemia of ESRD:  Hgb 9-10s - s/p Aranesp on 7/27.  CTM Secondary HPTH: Ca/Phos ok - continue home binders.  Held cinacalcet due to hypoCa. Ca likely correct near normal when accounting for albumin. Add on albumin today but continue holding cinacalcet A-fib: On amiodarone + warfarin, has IVC filter Hx type B aortic dissection s/p repair (2004)     11/14/2022, 9:51 AM  Recent Labs  Lab 11/08/22 0332 11/09/22 0221 11/11/22 0008 11/12/22 0049 11/14/22 0038  HGB 10.2* 9.8*   < > 10.7* 9.7*  ALBUMIN 2.5* 2.4*  --   --   --   CALCIUM 7.7* 7.8*   < > 8.5* 8.3*  PHOS 3.8 4.3  --   --   --   CREATININE 4.82* 5.52*   < > 6.26* 5.28*  K 4.0  4.0   < > 4.1 3.9   < > = values in this interval not displayed.    Inpatient medications:  (feeding supplement) PROSource Plus  30 mL Oral BID BM   amiodarone  200 mg Oral Daily   amoxicillin-clavulanate  1 tablet Oral QHS   Chlorhexidine Gluconate Cloth  6 each Topical Q0600   darbepoetin (ARANESP) injection - DIALYSIS  40 mcg Subcutaneous Q Sat-1800   hydrocerin   Topical Daily   lidocaine  3 patch Transdermal Q24H   midodrine  15 mg Oral TID WC   polyethylene glycol  17 g Oral BID   senna-docusate  1 tablet Oral BID   sevelamer carbonate  4.8 g Oral TID WC   Warfarin - Pharmacist Dosing Inpatient   Does not apply q1600    anticoagulant sodium citrate 100 mL/hr at 11/08/22 1735   acetaminophen, anticoagulant sodium citrate, calcium carbonate (dosed in mg elemental calcium), camphor-menthol **AND** hydrOXYzine, docusate sodium, HYDROmorphone (DILAUDID) injection, ondansetron **OR** ondansetron (ZOFRAN) IV, oxyCODONE, sorbitol, zolpidem

## 2022-11-14 NOTE — Consult Note (Addendum)
Hospital Consult    Reason for Consult:  heel ulcer left and left clavicular swelling Requesting Physician:  Butler Denmark MRN #:  [id number]  History of Present Illness: This is a 52 y.o. male who was seen by Dr. Edilia Bo in consult on 10/30/2022 for PAD and chronic venous insufficiency.  He felt he most likely had chronic infrainguinal arterial occlusive disease.  Pt is non ambulatory and confined to wheelchair.  At that time, he was not having significant sx.  At that time, he did not feel arteriography was indicated as it would not change his overall clinical picture.  His ABI's were non compressible.    Dr. Edilia Bo discussed bilateral amputations at the time if his legs worsened.  He was not interested at the time.    He has a new ulceration of the left heel.  He has contracture of his knees bilaterally as well.  He states he has pain in both feet and is about the same as it has been.   He also has chronic venous insufficiency and hyperkeratosis of the skin of both legs.  Given his PAD, he did not feel pt would be able to tolerate leg elevation or significant compression.  He tried to remove some necrotic skin but pt did not tolerate this.  WOC was consulted and Eucerin to legs daily was recommended.   He has hx of type B aortic dissection and this was unchanged on CTA on 11/08/2022.  The pt is on coumadin for afib.  We were asked to re-consult for left heel ulceration and swelling around left clavicle.  He states he does not recall any sticks around his clavicle.  Says he had a dialysis catheter on the left a couple of years ago.  Primary service has ordered a CT scan for evaluation.   He did have a chest tube insertion on 11/02/2022 for pleural effusion.  Pt does have ESRD on HD.  The pt is not on a statin for cholesterol management.  The pt is not on a daily aspirin.   Other AC:  coumadin The pt is not on medication for hypertension.   The pt is not on medication for diabetes PTA. Tobacco hx:   never  Past Medical History:  Diagnosis Date   Arthritis    hands and shoulders   Blindness and low vision    "Stargardt disease"   Dissection of aorta (HCC) 2004   a. s/p extensive repeair in 2004 in [place] complicated by ESRD, lower extremity paralysis, coma, and extended hospitalization of 2 years   ESRD (end stage renal disease) (HCC)    a. TTS   Headache    History of cardioversion 2014   Hypertension    Neuropathy    Non-healing non-surgical wound 03/2016   PAF (paroxysmal atrial fibrillation) (HCC)    a. s/p DCCV in 2014; b. on Coumadin; c. CHADS2VASc => 2 (HTN, vascular disease)   Paralysis (HCC)    due to dissection of aorta in 2004, lower extremities   Pneumonia     Past Surgical History:  Procedure Laterality Date   APPLICATION OF WOUND VAC Left 04/13/2016   Procedure: APPLICATION OF WOUND VAC;  Surgeon: Fransisco Hertz, MD;  Location: [facility] OR;  Service: Vascular;  Laterality: Left;   APPLICATION OF WOUND VAC Left 04/18/2016   Procedure: APPLICATION OF WOUND VAC;  Surgeon: Maeola Harman, MD;  Location: [facility] OR;  Service: Vascular;  Laterality: Left;  Wound vac change    APPLICATION OF  WOUND VAC Left 04/20/2016   Procedure: WOUND VAC CHANGE;  Surgeon: Fransisco Hertz, MD;  Location: [facility] OR;  Service: Vascular;  Laterality: Left;   AV FISTULA PLACEMENT     BASCILIC VEIN TRANSPOSITION Left 12/09/2015   Procedure: FIRST STAGE BASILIC VEIN TRANSPOSITION LEFT UPPER ARM;  Surgeon: Fransisco Hertz, MD;  Location: [facility] OR;  Service: Vascular;  Laterality: Left;   BASCILIC VEIN TRANSPOSITION Left 03/09/2016   Procedure: SECOND STAGE BASILIC VEIN TRANSPOSITION WITH REVISION OF ANASTOMOSIS LEFT UPPER ARM;  Surgeon: Fransisco Hertz, MD;  Location: [facility] OR;  Service: Vascular;  Laterality: Left;   CARDIOVERSION     CARDIOVERSION N/A 01/04/2019   Procedure: CARDIOVERSION;  Surgeon: Lewayne Bunting, MD;  Location: [facility] ENDOSCOPY;  Service: Cardiovascular;  Laterality: N/A;   CHEST TUBE INSERTION Left  11/02/2022   Procedure: CHEST TUBE INSERTION;  Surgeon: Lynnell Catalan, MD;  Location: [facility] ENDOSCOPY;  Service: Pulmonary;  Laterality: Left;  Please have both Pigtail (COOK) and chest tube tray available   REPAIR OF ACUTE ASCENDING THORACIC AORTIC DISSECTION     REVISON OF ARTERIOVENOUS FISTULA Left 04/20/2016   Procedure: LIGATION OF BASILIC VEIN TRANSPOSITION;  Surgeon: Fransisco Hertz, MD;  Location: [facility] OR;  Service: Vascular;  Laterality: Left;   WOUND DEBRIDEMENT Left 04/13/2016   Procedure: DEBRIDEMENT WOUND;  Surgeon: Fransisco Hertz, MD;  Location: [facility] OR;  Service: Vascular;  Laterality: Left;    Allergies  Allergen Reactions   Ciprofloxacin Other (See Comments)    Aortic dissection   Heparin Other (See Comments)    UNSPECIFIED REACTION :  On Coumadin since 2004   HIT panel negative 01/19/17   Doxercalciferol Other (See Comments)   Quinolones Other (See Comments)    unknown    Prior to Admission medications   Medication Sig Start Date End Date Taking? Authorizing Provider  amiodarone (PACERONE) 200 MG tablet Take 1 tablet (200 mg total) by mouth daily. 08/31/22  Yes Jake Bathe, MD  cinacalcet (SENSIPAR) 60 MG tablet Take 120 mg by mouth every evening. 07/30/20  Yes [provider]  Multiple Vitamins-Minerals (MULTIVITAMIN WITH MINERALS) tablet Take 1 tablet by mouth daily.   Yes [provider]  RENVELA 2.4 g PACK Take 4.8 g by mouth in the morning, at noon, and at bedtime. 09/21/20  Yes [provider]  warfarin (COUMADIN) 2 MG tablet Take 1 tablet by mouth daily or as directed by Anticoagulation Clinic. 09/19/22  Yes Jake Bathe, MD  docusate sodium (COLACE) 100 MG capsule Take 1 capsule (100 mg total) by mouth every 12 (twelve) hours. Patient not taking: Reported on 10/27/2022 12/10/20   Arby Barrette, MD  metoprolol tartrate (LOPRESSOR) 25 MG tablet Take 0.5 tablets (12.5 mg total) by mouth 2 (two) times daily. Hold on the AM of dialysis days Patient  not taking: Reported on 10/27/2022 12/18/20   Jake Bathe, MD    Social History   Socioeconomic History   Marital status: Divorced    Spouse name: Not on file   Number of children: 2   Years of education: Not on file   Highest education level: Not on file  Occupational History   Occupation: DIABLED  Tobacco Use   Smoking status: Never   Smokeless tobacco: Never  Vaping Use   Vaping status: Never Used  Substance and Sexual Activity   Alcohol use: No   Drug use: No   Sexual activity: Not on file  Other Topics Concern  Not on file  Social History Narrative   Not on file   Social Determinants of Health   Financial Resource Strain: Medium Risk (09/26/2022)   Overall Financial Resource Strain (CARDIA)    Difficulty of Paying Living Expenses: Somewhat hard  Food Insecurity: No Food Insecurity (10/27/2022)   Hunger Vital Sign    Worried About Running Out of Food in the Last Year: Never true    Ran Out of Food in the Last Year: Never true  Transportation Needs: No Transportation Needs (10/27/2022)   PRAPARE - Administrator, Civil Service (Medical): No    Lack of Transportation (Non-Medical): No  Physical Activity: Inactive (09/26/2022)   Exercise Vital Sign    Days of Exercise per Week: 0 days    Minutes of Exercise per Session: 0 min  Stress: No Stress Concern Present (09/26/2022)   [employer] of Occupational Health - Occupational Stress Questionnaire    Feeling of Stress : Not at all  Social Connections: Socially Isolated (09/26/2022)   Social Connection and Isolation Panel [NHANES]    Frequency of Communication with Friends and Family: More than three times a week    Frequency of Social Gatherings with Friends and Family: Twice a week    Attends Religious Services: Never    Database administrator or Organizations: No    Attends Banker Meetings: Never    Marital Status: Divorced  Catering manager Violence: Not At Risk (10/27/2022)    Humiliation, Afraid, Rape, and Kick questionnaire    Fear of Current or Ex-Partner: No    Emotionally Abused: No    Physically Abused: No    Sexually Abused: No    Family History  Problem Relation Age of Onset   Cancer Mother    Hypertension Mother    Cancer Father    Hypertension Father    Thyroid disease Sister    Stroke Brother        11    ROS: [x]  Positive   [ ]  Negative   [ ]  All sytems reviewed and are negative  Cardiac: [x]  afib on [facility]   Vascular: []  pain in legs while walking [x]  pain in legs at rest [x]  pain in legs at night [x]  left heel ulcer [x]  hyperkeratosis BLE   Pulmonary: []  asthma/wheezing []  home O2  Neurologic: []  hx of CVA []  mini stroke   Hematologic: []  hx of cancer  Endocrine:   []  diabetes []  thyroid disease  GI []  GERD  GU: [x]  CKD/renal failure [x]  HD--[]  M/W/F or [x]  T/T/S  Psychiatric: []  anxiety []  depression  Musculoskeletal: [x]  arthritis [x]  bilateral knee contractures  Integumentary: []  rashes []  ulcers  Constitutional: []  fever  []  chills  Physical Examination  Vitals:   11/14/22 0737 11/14/22 1122  BP: 111/67 (!) 94/56  Pulse: 60 62  Resp: 18 17  Temp: 98.6 F (37 C) 98.2 F (36.8 C)  SpO2: 96% 99%   Body mass index is 16.89 kg/m.  General:  WDWN in NAD Gait: Not observed HENT: WNL, normocephalic; blindness/decreased vision Pulmonary: normal non-labored breathing Cardiac: regular Abdomen:  soft, NT; aortic pulse is not palpable Skin: without rashes; hyperkeratosis bilateral lower legs Vascular Exam/Pulses: Palpable radial pulses and femoral pulses bilaterally Extremities: contracture bilateral knees  Musculoskeletal: no muscle wasting or atrophy  Neurologic: A&O X 3 Psychiatric:  The pt has Normal affect.   CBC    Component Value Date/Time   WBC 11.7 (H) 11/14/2022 0038  RBC 3.31 (L) 11/14/2022 0038   HGB 9.7 (L) 11/14/2022 0038   HGB 11.4 (L) 08/31/2022 1137   HCT 32.7 (L)  11/14/2022 0038   HCT 34.7 (L) 08/31/2022 1137   PLT 144 (L) 11/14/2022 0038   PLT 135 (L) 08/31/2022 1137   MCV 98.8 11/14/2022 0038   MCV 93 08/31/2022 1137   MCH 29.3 11/14/2022 0038   MCHC 29.7 (L) 11/14/2022 0038   RDW 15.5 11/14/2022 0038   RDW 13.4 08/31/2022 1137   LYMPHSABS 0.8 11/07/2022 0048   LYMPHSABS 1.0 02/13/2017 1425   MONOABS 1.1 (H) 11/07/2022 0048   EOSABS 0.1 11/07/2022 0048   EOSABS 0.1 02/13/2017 1425   BASOSABS 0.0 11/07/2022 0048   BASOSABS 0.0 02/13/2017 1425    BMET    Component Value Date/Time   NA 133 (L) 11/14/2022 0038   NA 143 08/31/2022 1137   K 3.9 11/14/2022 0038   CL 96 (L) 11/14/2022 0038   CO2 27 11/14/2022 0038   GLUCOSE 101 (H) 11/14/2022 0038   BUN 32 (H) 11/14/2022 0038   BUN 57 (H) 08/31/2022 1137   CREATININE 5.28 (H) 11/14/2022 0038   CALCIUM 8.3 (L) 11/14/2022 0038   GFRNONAA 12 (L) 11/14/2022 0038   GFRAA 5 (L) 09/04/2019 1114    COAGS: Lab Results  Component Value Date   INR 1.4 (H) 11/14/2022   INR 1.4 (H) 11/13/2022   INR 1.4 (H) 11/12/2022     Non-Invasive Vascular Imaging:   CTA chest pending    ASSESSMENT/PLAN: This is a 52 y.o. male with hx of aortic dissection with thoracic stent graft in 2004 in [place].  He has combined chronic venous insufficiency and PAD with chronic thickening of his skin with hyperkeratosis and new left heel ulceration.     -pt is non ambulatory and has chronic contracture of bilateral knees.  He would not be a candidate for arteriography most likely and would require amputation if pain/ulceration worsens.  He does not recall this conversation with Dr. Edilia Bo.  Discussed with pt and family to try to float his heels off the bed.  Probably would not tolerate leg elevation or compression.  -as for his left clavicular mass-CTA has been ordered.  No evidence of active bleeding or enlargement.   -pt is on coumadin and INR today is 1.4 -Dr. Randie Heinz to evaluate pt and determine further  plan   Doreatha Massed, PA-C [facility] [phone]  I have independently interviewed and examined patient and agree with PA assessment and plan above.  Left heel is soft appears necrotic and as above needs to float his heels on the bed.  Patient has knee contractures does not walk does not use his legs for standing or pivoting as such would be best served with left above-knee amputation.  This time patient is not willing to concede to amputation although we discussed that this will likely be inevitable but currently not life-threatening or causing significant pain without palpation.  Left infraclavicular mass appears to be sebaceous cyst by my physical exam and does not have any pulsatility.   C. Randie Heinz, MD [facility] [place] Office: [phone] Pager: [phone]

## 2022-11-15 DIAGNOSIS — N186 End stage renal disease: Secondary | ICD-10-CM | POA: Diagnosis not present

## 2022-11-15 DIAGNOSIS — J9601 Acute respiratory failure with hypoxia: Secondary | ICD-10-CM | POA: Diagnosis not present

## 2022-11-15 DIAGNOSIS — I5032 Chronic diastolic (congestive) heart failure: Secondary | ICD-10-CM | POA: Diagnosis not present

## 2022-11-15 DIAGNOSIS — J181 Lobar pneumonia, unspecified organism: Secondary | ICD-10-CM | POA: Diagnosis not present

## 2022-11-15 MED ORDER — ARGATROBAN 50 MG/50ML IV SOLN
1.0000 ug/kg/min | INTRAVENOUS | Status: DC
  Administered 2022-11-15 – 2022-11-16 (×3): 1 ug/kg/min via INTRAVENOUS
  Filled 2022-11-15 (×3): qty 50

## 2022-11-15 MED ORDER — WARFARIN SODIUM 4 MG PO TABS: 4.0000 mg | ORAL_TABLET | Freq: Once | ORAL | Status: DC

## 2022-11-15 MED ORDER — HEPARIN SODIUM (PORCINE) 1000 UNIT/ML IJ SOLN
INTRAMUSCULAR | Status: AC
  Filled 2022-11-15: qty 4

## 2022-11-15 NOTE — Progress Notes (Signed)
Received patient in bed.Awake,alert and oriented x 4. Consent verified.  Access used: Right HD catheter that worked well.Dressing on date.  Duration of treatment : 3.25 hoiurs.  Fluid removed: Met 1 liter UF goal  Hemo comment : Tolerated treatment well.  Hand off to the patient's nurse.

## 2022-11-15 NOTE — Plan of Care (Signed)
  Problem: Education: Goal: Knowledge of General Education information will improve Description: Including pain rating scale, medication(s)/side effects and non-pharmacologic comfort measures Outcome: Progressing   Problem: Elimination: Goal: Will not experience complications related to bowel motility Outcome: Progressing   

## 2022-11-15 NOTE — Progress Notes (Addendum)
PROGRESS NOTE  Corey [name] IEP:[id number] DOB: 1970/04/12   PCP: [name], NP  Patient is from: Home  DOA: 10/27/2022 LOS: 19  Chief complaints Chief Complaint  Patient presents with   Flank Pain   Shoulder Pain     Brief Narrative / Interim history: 52 year old M with PMH of ESRD on HD TTS, calciphylaxis, paroxysmal A-fib on warfarin, type B aortic dissection, PAD, chronic LE edema, severe malnutrition, blindness and nonambulatory status presenting to the hospital with SOB and several days of left shoulder, chest and flank pain.  As a result of pain, he missed 2 HD sessions.  Per EMS, he was saturating at 85% on RA.  CT chest/abdomen/pelvis showed moderate to large loculated left-sided pleural effusion with lower lobe consolidation.  He was admitted for acute respiratory failure with hypoxia due to left lobar pneumonia with parapneumonic effusion.  PCCM consulted, and had a chest tube placed on 7/31 after INR reversal with vitamin K.  He required multiple rounds of lytics.  Pleural fluid cultures NGTD.  Started on ceftriaxone and Zithromax on 8/1.  Transitioned to p.o. Augmentin on 8/9 that he will continue until 8/15.  Chest tube removed on 8/9.  Found to have left heel necrosis on 8/12.  Vascular surgery consulted and recommended left AKA.  Patient is undecided but leaning toward proceeding with surgery after talking to his brother.  Vascular surgery on board.  Therapy recommended CIR.  CIR following.   Subjective: Seen and examined earlier this morning.  No major events overnight of this morning.  Reports left-sided pain mainly at previous chest tube site.  He says he talked to his brother about surgery.  He says his brother advised him to "do what you got to do".  He has questions about timing and details of surgery that I could not answer.  I advised him to discuss this with his vascular surgeon.  Objective: Vitals:   11/15/22 0400 11/15/22 0745 11/15/22 0753 11/15/22  1126  BP: 106/62  111/60 117/66  Pulse: 62  (!) 58 (!) 57  Resp: 19  (!) 22 19  Temp: 98.3 F (36.8 C)  98.4 F (36.9 C) 98.3 F (36.8 C)  TempSrc: Oral  Oral Oral  SpO2: 96% 100% 100% 100%  Weight:      Height:        Examination:  GENERAL: No apparent distress.  Nontoxic. HEENT: MMM.  Hearing grossly intact.  Legally blind. NECK: Supple.  No apparent JVD.  RESP:  No IWOB.  Fair aeration bilaterally. CVS:  RRR. Heart sounds normal.  ABD/GI/GU: BS+. Abd soft, NTND.  MSK/EXT: Significant muscle mass and subcu fat loss.  Dressing over left heel with some stains.  Hypertrophic nails.  Difficult to palpate pulses. SKIN: Tree bark skin is in BLE as seen in picture below. NEURO: Awake, alert and oriented appropriately.  No apparent focal neuro deficit. PSYCH: Calm. Normal affect.     Procedures:  7/31-8/9-left chest tube  Microbiology summarized: 7/26-MRSA PCR screen nonreactive 7/31-pleural fluid culture NGTD 7/31-pleural fluid AFB culture pending.  Smear negative. 7/31-pleural fluid fungal culture pending.  Assessment and plan: Principal Problem:   Acute respiratory failure with hypoxia (HCC) Active Problems:   ESRD on dialysis [facility])   Calciphylaxis   Essential hypertension   Chronic diastolic CHF (congestive heart failure) (HCC)   Atrial fibrillation, chronic (HCC)   End-stage renal disease on hemodialysis (HCC)   AAA (abdominal aortic aneurysm) without rupture (HCC)   Hyperkalemia  PVD (peripheral vascular disease) (HCC)   Constipation   Lobar pneumonia (HCC)   Parapneumonic effusion   Gangrene (HCC)  Acute respiratory failure with hypoxia due to left lower lobe pneumonia with parapneumonic effusion and fluid overload: Resolved.  On room air. -Treated with dialysis, left chest tube placement and antibiotics. -Chest tube removed 8/9. -Initially started on azithromycin and ceftriaxone on 8/1.  Transition to p.o. Augmentin on 8/9. -Fluid management by  dialysis   ESRD on HD TTS: Reportedly missed 2 HD sessions due to pain. -Dialysis per nephrology  Acute on chronic diastolic CHF: TTE in 2020 with LVEF of 60 to 65%,  indeterminate DD and RVSP of 42.8 mmHg.  Currently appears euvolemic. -Fluid management by dialysis per nephrology.    Gangrene of left heel/peripheral artery disease -Vascular surgery recommended AKA.   -Seems to be open to surgery now after talking to his brother. -Hold warfarin.  Chronic pedal edema/ Venous stasis/calciphylaxis of lower extremity Hypertrophic toenails -cont Eucerin and Sarna lotion on legs -May ask podiatry if they can trim toenails on right foot after left AKA.    Mass in left subclavicular region- Sebaceous cyst? According to the patient and family, this mass is new, however, according to the CT scan that was performed today, the mass has been present since 2021 and is unchanged   Paroxysmal atrial fibrillation: Rate controlled -Continue amiodarone -Hold warfarin.  Start heparin per pharmacy.  Addendum Per pharmacy, history of HIT -Plan for argatroban instead of heparin   Chronic hypotension: Normotensive. -s/p albumin infusions x 2 -Continue midodrine  Type B thoracic aortic dissection with aneurysmal dilatation of the proximal descending thoracic aorta up to 4.5 cm in diameter. Type B thoracic aortic dissection  Legally blind   Anemia of chronic disease: Stable. Recent Labs    11/04/22 0122 11/05/22 0058 11/06/22 0138 11/07/22 0048 11/08/22 0332 11/09/22 0221 11/11/22 0008 11/12/22 0049 11/14/22 0038 11/15/22 0108  HGB 9.6* 9.6* 9.6* 10.1* 10.2* 9.8* 10.9* 10.7* 9.7* 9.7*  -Per nephrology  Bedbound/wheelchair-bound Disposition:   Severe malnutrition Body mass index is 16.89 kg/m. -Consult dietitian.          DVT prophylaxis:  On full dose anticoagulation  Code Status: Full code Family Communication: None at bedside Level of care: Telemetry Cardiac Status  is: Inpatient Remains inpatient appropriate because: Respiratory failure with LLL pneumonia and parapneumonic effusion, left heel gangrene/ulcer   Final disposition: CIR Consultants:  Nephrology Vascular surgery  55 minutes with more than 50% spent in reviewing records, counseling patient/family and coordinating care.   Sch Meds:  Scheduled Meds:  (feeding supplement) PROSource Plus  30 mL Oral BID BM   amiodarone  200 mg Oral Daily   amoxicillin-clavulanate  1 tablet Oral QHS   Chlorhexidine Gluconate Cloth  6 each Topical Q0600   darbepoetin (ARANESP) injection - DIALYSIS  40 mcg Subcutaneous Q Sat-1800   hydrocerin   Topical Daily   lidocaine  3 patch Transdermal Q24H   midodrine  15 mg Oral TID WC   polyethylene glycol  17 g Oral BID   senna-docusate  1 tablet Oral BID   sevelamer carbonate  4.8 g Oral TID WC   Continuous Infusions:  anticoagulant sodium citrate 100 mL/hr at 11/15/22 0700   PRN Meds:.acetaminophen, anticoagulant sodium citrate, calcium carbonate (dosed in mg elemental calcium), camphor-menthol **AND** hydrOXYzine, docusate sodium, HYDROmorphone (DILAUDID) injection, ondansetron **OR** ondansetron (ZOFRAN) IV, oxyCODONE, sorbitol, zolpidem  Antimicrobials: Anti-infectives (From admission, onward)    Start  Dose/Rate Route Frequency Ordered Stop   11/11/22 2200  amoxicillin-clavulanate (AUGMENTIN) 500-125 MG per tablet 1 tablet        1 tablet Oral Daily at bedtime 11/11/22 1316 11/17/22 2159   11/08/22 1800  cefTRIAXone (ROCEPHIN) 2 g in sodium chloride 0.9 % 100 mL IVPB  Status:  Discontinued        2 g 200 mL/hr over 30 Minutes Intravenous Every 24 hours 11/08/22 1656 11/11/22 1316   11/03/22 0915  cefTRIAXone (ROCEPHIN) 2 g in sodium chloride 0.9 % 100 mL IVPB        2 g 200 mL/hr over 30 Minutes Intravenous Every 24 hours 11/03/22 0823 11/08/22 1701   11/03/22 0915  azithromycin (ZITHROMAX) 500 mg in sodium chloride 0.9 % 250 mL IVPB        500  mg 250 mL/hr over 60 Minutes Intravenous Every 24 hours 11/03/22 0823 11/08/22 1701        I have personally reviewed the following labs and images: CBC: Recent Labs  Lab 11/09/22 0221 11/11/22 0008 11/12/22 0049 11/14/22 0038 11/15/22 0108  WBC 10.1 9.2 9.4 11.7* 10.3  HGB 9.8* 10.9* 10.7* 9.7* 9.7*  HCT 33.5* 37.5* 36.4* 32.7* 33.3*  MCV 97.7 96.6 97.1 98.8 95.7  PLT 169 143* 132* 144* 133*   BMP &GFR Recent Labs  Lab 11/09/22 0221 11/11/22 0008 11/12/22 0049 11/14/22 0038 11/15/22 0108  NA 135 135 136 133* 136  K 4.0 3.8 4.1 3.9 4.0  CL 97* 93* 96* 96* 98  CO2 25 26 23 27 25   GLUCOSE 87 91 81 101* 87  BUN 31* 28* 41* 32* 43*  CREATININE 5.52* 4.76* 6.26* 5.28* 6.66*  CALCIUM 7.8* 8.2* 8.5* 8.3* 8.7*  PHOS 4.3  --   --  3.6  --    Estimated Creatinine Clearance: 11.2 mL/min (A) (by C-G formula based on SCr of 6.66 mg/dL (H)). Liver & Pancreas: Recent Labs  Lab 11/09/22 0221 11/14/22 0038  ALBUMIN 2.4* 2.4*   No results for input(s): "LIPASE", "AMYLASE" in the last 168 hours. No results for input(s): "AMMONIA" in the last 168 hours. Diabetic: No results for input(s): "HGBA1C" in the last 72 hours. No results for input(s): "GLUCAP" in the last 168 hours. Cardiac Enzymes: No results for input(s): "CKTOTAL", "CKMB", "CKMBINDEX", "TROPONINI" in the last 168 hours. No results for input(s): "PROBNP" in the last 8760 hours. Coagulation Profile: Recent Labs  Lab 11/11/22 1120 11/12/22 0049 11/13/22 0059 11/14/22 0038 11/15/22 0108  INR 1.2 1.4* 1.4* 1.4* 1.4*   Thyroid Function Tests: No results for input(s): "TSH", "T4TOTAL", "FREET4", "T3FREE", "THYROIDAB" in the last 72 hours. Lipid Profile: No results for input(s): "CHOL", "HDL", "LDLCALC", "TRIG", "CHOLHDL", "LDLDIRECT" in the last 72 hours. Anemia Panel: No results for input(s): "VITAMINB12", "FOLATE", "FERRITIN", "TIBC", "IRON", "RETICCTPCT" in the last 72 hours. Urine analysis: No results  found for: "COLORURINE", "APPEARANCEUR", "LABSPEC", "PHURINE", "GLUCOSEU", "HGBUR", "BILIRUBINUR", "KETONESUR", "PROTEINUR", "UROBILINOGEN", "NITRITE", "LEUKOCYTESUR" Sepsis Labs: Invalid input(s): "PROCALCITONIN", "LACTICIDVEN"  Microbiology: No results found for this or any previous visit (from the past 240 hour(s)).  Radiology Studies: CT CHEST W CONTRAST  Result Date: 11/14/2022 CLINICAL DATA:  Chest wall mass EXAM: CT CHEST WITH CONTRAST TECHNIQUE: Multidetector CT imaging of the chest was performed during intravenous contrast administration. RADIATION DOSE REDUCTION: This exam was performed according to the departmental dose-optimization program which includes automated exposure control, adjustment of the mA and/or kV according to patient size and/or use of iterative reconstruction technique. CONTRAST:  75mL OMNIPAQUE IOHEXOL 350 MG/ML SOLN COMPARISON:  Chest CT dated November 08, 2022; CTA chest, abdomen and pelvis dated October 27, 2022 FINDINGS: Cardiovascular: Cardiomegaly with right heart enlargement. Right central venous catheter with tip in the right atrium. Dilated main pulmonary artery, measuring up to 3.6 cm. Severe coronary artery calcifications. Partially visualized dissection of the descending thoracic aorta originating near the takeoff of the left subclavian artery, unchanged when compared with the prior exam. Unchanged dilation of the descending thoracic aorta, measuring up to 4.5 cm. Mediastinum/Nodes: No enlarged lymph nodes seen in the chest. Esophagus and thyroid are unremarkable. Lungs/Pleura: Central airways are patent. Mild diffuse ground-glass opacities and septal thickening. Consolidations of the bilateral lower lungs which are likely due to atelectasis. Trace left-greater-than-right pleural effusions. Upper Abdomen: Innumerable cysts of varying complexity of the partially visualized right kidney, unchanged when compared with the prior exam. Gallstones. No acute abnormality.  Musculoskeletal: Subcutaneous mass of the anterior upper left chest wall measuring 3.2 x 2.2 cm, unchanged in size when compared with multiple prior exams dating back to November 27, 2019. Diffuse osseous sclerosis, likely sequela of chronic renal disease. No aggressive appearing osseous lesions. IMPRESSION: 1. Subcutaneous mass of the anterior upper left chest wall, unchanged when compared with multiple prior exams dating back to November 27, 2019, possibly old hematoma. Recommend soft ultrasound for better evaluation. 2. Pulmonary edema and trace left-greater-than-right pleural effusions. 3. Type B aortic dissection, not significantly changed when compared with the prior CTA. 4. Dilated main pulmonary artery, which can be seen in the setting of pulmonary hypertension. 5. Cardiomegaly and severe coronary artery calcifications. Electronically Signed   By: Allegra Lai M.D.   On: 11/14/2022 19:04       T.  [facility]  If 7PM-7AM, please contact night-coverage [email or web address] 11/15/2022, 12:04 PM

## 2022-11-15 NOTE — Progress Notes (Signed)
  [facility] Progress Note   Subjective:  No complaints. Discussions w/ VVS and pal care regarding suggested AKA  Objective Vitals:   11/14/22 2345 11/15/22 0400 11/15/22 0745 11/15/22 0753  BP: 96/65 106/62  111/60  Pulse: (!) 58 62  (!) 58  Resp: 16 19  (!) 22  Temp: 98.4 F (36.9 C) 98.3 F (36.8 C)  98.4 F (36.9 C)  TempSrc: Oral Oral  Oral  SpO2: 100% 96% 100% 100%  Weight:      Height:       Physical Exam General: lying in bed, chronically ill appearing, no distress Heart: no rub, normal rate Lungs: bilateral chest rise, no iwob Abdomen: soft Extremities: bilateral venous stasis changes, no edema Dialysis Access: TDC  c/d/i  OP HD: TTS [facility] 4h  2/2 bath  66kg   TDC   no heparin (allergy)  lock TDC w/ citrate - No ESA or VDRA - Binder: Renvela pwdr 4.8g TID - Sensipar 180mg  every day at home   Assessment/Plan: Acute hypoxic respiratory failure: In setting of missed HD, pna w/ effusion. CXR with pleural effusion s/p chest tube 7/31. Resolved. PNA/ L parapneumonic pleural effusion: s/p thoracentesis with cultures. Got IV abx course, now on augmentin thru 8/14.  Afebrile ESRD: maintain TTS dialysis schedule Heparin allergy - use citrate for cath lock Hypotension/ volume: BP low, LE edema resolved.  Is on midodrine. Will need lower EDW Anemia of ESRD:  Hgb 9-10s - s/p Aranesp on 7/27.  CTM Secondary HPTH: Ca/Phos ok - continue home binders.  Held cinacalcet due to hypoCa but corrects near normal when accounting for albumin. CTM for now A-fib: On amiodarone + warfarin, has IVC filter Hx type B aortic dissection s/p repair (2004)  Left leg wound - VVS suggesting aka, convos also with pal care. Patient hesitant    11/15/2022, 10:11 AM  Recent Labs  Lab 11/09/22 0221 11/11/22 0008 11/14/22 0038 11/15/22 0108  HGB 9.8*   < > 9.7* 9.7*  ALBUMIN 2.4*  --  2.4*  --   CALCIUM 7.8*   < > 8.3* 8.7*  PHOS 4.3  --  3.6  --   CREATININE 5.52*   < >  5.28* 6.66*  K 4.0   < > 3.9 4.0   < > = values in this interval not displayed.    Inpatient medications:  (feeding supplement) PROSource Plus  30 mL Oral BID BM   amiodarone  200 mg Oral Daily   amoxicillin-clavulanate  1 tablet Oral QHS   Chlorhexidine Gluconate Cloth  6 each Topical Q0600   darbepoetin (ARANESP) injection - DIALYSIS  40 mcg Subcutaneous Q Sat-1800   hydrocerin   Topical Daily   lidocaine  3 patch Transdermal Q24H   midodrine  15 mg Oral TID WC   polyethylene glycol  17 g Oral BID   senna-docusate  1 tablet Oral BID   sevelamer carbonate  4.8 g Oral TID WC   warfarin  4 mg Oral ONCE-1600   Warfarin - Pharmacist Dosing Inpatient   Does not apply q1600    anticoagulant sodium citrate 100 mL/hr at 11/15/22 0700   acetaminophen, anticoagulant sodium citrate, calcium carbonate (dosed in mg elemental calcium), camphor-menthol **AND** hydrOXYzine, docusate sodium, HYDROmorphone (DILAUDID) injection, ondansetron **OR** ondansetron (ZOFRAN) IV, oxyCODONE, sorbitol, zolpidem

## 2022-11-15 NOTE — Progress Notes (Signed)
ANTICOAGULATION CONSULT NOTE - Follow Up Consult  Pharmacy Consult for warfarin>>argatroban Indication: atrial fibrillation  Allergies  Allergen Reactions   Ciprofloxacin Other (See Comments)    Aortic dissection   Heparin Other (See Comments)    UNSPECIFIED REACTION :  On Coumadin since 2004   HIT panel negative 01/19/17   Doxercalciferol Other (See Comments)   Quinolones Other (See Comments)    unknown    Patient Measurements: Height: 6\' 3"  (190.5 cm) Weight: 58.9 kg (129 lb 13.6 oz) IBW/kg (Calculated) : 84.5  Vital Signs: Temp: 98.1 F (36.7 C) (08/13 1952) Temp Source: Oral (08/13 1952) BP: 102/68 (08/13 1952) Pulse Rate: 68 (08/13 1952)  Labs: Recent Labs    11/13/22 0059 11/14/22 0038 11/15/22 0108  HGB  --  9.7* 9.7*  HCT  --  32.7* 33.3*  PLT  --  144* 133*  LABPROT 16.9* 17.8* 17.4*  INR 1.4* 1.4* 1.4*  CREATININE  --  5.28* 6.66*    Estimated Creatinine Clearance: 10.8 mL/min (A) (by C-G formula based on SCr of 6.66 mg/dL (H)).   Medications:  Scheduled:   (feeding supplement) PROSource Plus  30 mL Oral BID BM   amiodarone  200 mg Oral Daily   amoxicillin-clavulanate  1 tablet Oral QHS   Chlorhexidine Gluconate Cloth  6 each Topical Q0600   darbepoetin (ARANESP) injection - DIALYSIS  40 mcg Subcutaneous Q Sat-1800   heparin sodium (porcine)       hydrocerin   Topical Daily   lidocaine  3 patch Transdermal Q24H   midodrine  15 mg Oral TID WC   polyethylene glycol  17 g Oral BID   senna-docusate  1 tablet Oral BID   sevelamer carbonate  4.8 g Oral TID WC    Assessment: 52 YOM with pleural effusion. S/p chest tube placement and removal on 8/9. Was on warfarin PTA for history of afib which was resumed 8/9. Of note, patient was on amiodarone PTA.   -Patient is not compliant with anticoagulation follow-ups, but last home regimen was warfarin 2 mg daily. As outpatient has lasi couple INRs have been 1.5 or less -INR today 1.4 (no real movement).   Of note, pt is on Augmentin which may potentiate effects of warfarin.  Pt may need AKA. Holding coumadin and bridging with argatroban as pt with questionable h/o HIT.  Argatroban ordered at 1500 but never started - pt to HD close to this time. Spoke to RN who will start now ~2300.  Goal of Therapy:  aPTT 50-90 INR 2-3 Monitor platelets by anticoagulation protocol: Yes   Plan:  Argatroban 25mcg/kg/min Check 4 hr PTT then daily  Christoper Fabian, PharmD, BCPS Please see amion for complete clinical pharmacist phone list 11/15/2022 10:39 PM

## 2022-11-15 NOTE — Progress Notes (Signed)
Physical Therapy Treatment Patient Details Name: Corey [name] MRN: [id number] DOB: 01/01/1971 Today's Date: 11/15/2022   History of Present Illness 52 year old man admitted 7/25 with left pleural effusion.  He has had general decline in the state of health over the last several months with loss of appetite. Pt with large left loculated pleural effusion with CT placed 7/31.  tPa 8/3.  PMH:  ESRD, T,Th, Sat HD; paralysis bil LEs after aortic dissection, blindness    PT Comments  Pt received in supine and agreeable to session. Pt able to sit to EOB with min guard, however requires increased time due to L flank pain. Pt reporting dizziness in sitting, however BP remaining stable throughout. Pt able to tolerate seated therex to focus on core control and posture. Pt declining transfer to the chair due to fear of increased dizziness, but is agreeable to remain with the bed in chair position to promote upright posture. Pt continues to benefit from PT services to progress toward functional mobility goals.    If plan is discharge home, recommend the following: Assistance with cooking/housework;Assist for transportation;Help with stairs or ramp for entrance   Can travel by private vehicle        Equipment Recommendations  None recommended by PT    Recommendations for Other Services       Precautions / Restrictions Precautions Precautions: Fall Restrictions Weight Bearing Restrictions: No     Mobility  Bed Mobility Overal bed mobility: Needs Assistance Bed Mobility: Supine to Sit, Sit to Supine     Supine to sit: Contact guard, Used rails Sit to supine: Contact guard assist   General bed mobility comments: Increased time and effort    Transfers                   General transfer comment: Pt deferred due to dizziness       Balance Overall balance assessment: Needs assistance Sitting-balance support: Feet supported, No upper extremity supported Sitting balance-Leahy Scale:  Fair Sitting balance - Comments: sitting EOB                                    Cognition Arousal: Alert Behavior During Therapy: Flat affect Overall Cognitive Status: Within Functional Limits for tasks assessed                                          Exercises Other Exercises Other Exercises: Scapular retraction x5 Other Exercises: lateral leans to R/L elbows x5 each    General Comments General comments (skin integrity, edema, etc.): Pt reporting dizziness in sitting. BP 113/73 when first sitting EOB and 112/67 after 10 mins.      Pertinent Vitals/Pain Pain Assessment Pain Assessment: Faces Faces Pain Scale: Hurts even more Pain Location: L flank Pain Descriptors / Indicators: Discomfort, Guarding, Grimacing Pain Intervention(s): Monitored during session, Limited activity within patient's tolerance, Repositioned     PT Goals (current goals can now be found in the care plan section) Acute Rehab PT Goals Patient Stated Goal: to go home after rehab PT Goal Formulation: With patient Time For Goal Achievement: 11/25/22 Potential to Achieve Goals: Good Progress towards PT goals: Progressing toward goals    Frequency    Min 1X/week      PT Plan  AM-PAC PT "6 Clicks" Mobility   Outcome Measure  Help needed turning from your back to your side while in a flat bed without using bedrails?: A Little Help needed moving from lying on your back to sitting on the side of a flat bed without using bedrails?: A Little Help needed moving to and from a bed to a chair (including a wheelchair)?: A Little Help needed standing up from a chair using your arms (e.g., wheelchair or bedside chair)?: Total Help needed to walk in hospital room?: Total Help needed climbing 3-5 steps with a railing? : Total 6 Click Score: 12    End of Session Equipment Utilized During Treatment: Oxygen Activity Tolerance: Patient tolerated treatment well Patient  left: in bed;with call bell/phone within reach Nurse Communication: Mobility status PT Visit Diagnosis: Muscle weakness (generalized) (M62.81);Pain     Time: 2130-8657 PT Time Calculation (min) (ACUTE ONLY): 43 min  Charges:    $Therapeutic Exercise: 8-22 mins $Therapeutic Activity: 23-37 mins                       Johny Shock, PTA Acute Rehabilitation Services Secure Chat Preferred  [phone]    Johny Shock 11/15/2022, 11:15 AM

## 2022-11-15 NOTE — Progress Notes (Addendum)
ANTICOAGULATION CONSULT NOTE - Follow Up Consult  Pharmacy Consult for warfarin>>argatroban Indication: atrial fibrillation  Allergies  Allergen Reactions   Ciprofloxacin Other (See Comments)    Aortic dissection   Heparin Other (See Comments)    UNSPECIFIED REACTION :  On Coumadin since 2004   HIT panel negative 01/19/17   Doxercalciferol Other (See Comments)   Quinolones Other (See Comments)    unknown    Patient Measurements: Height: 6\' 3"  (190.5 cm) Weight: 61.3 kg (135 lb 2.3 oz) IBW/kg (Calculated) : 84.5  Vital Signs: Temp: 98.4 F (36.9 C) (08/13 0753) Temp Source: Oral (08/13 0753) BP: 111/60 (08/13 0753) Pulse Rate: 58 (08/13 0753)  Labs: Recent Labs    11/13/22 0059 11/14/22 0038 11/15/22 0108  HGB  --  9.7* 9.7*  HCT  --  32.7* 33.3*  PLT  --  144* 133*  LABPROT 16.9* 17.8* 17.4*  INR 1.4* 1.4* 1.4*  CREATININE  --  5.28* 6.66*    Estimated Creatinine Clearance: 11.2 mL/min (A) (by C-G formula based on SCr of 6.66 mg/dL (H)).   Medications:  Scheduled:   (feeding supplement) PROSource Plus  30 mL Oral BID BM   amiodarone  200 mg Oral Daily   amoxicillin-clavulanate  1 tablet Oral QHS   Chlorhexidine Gluconate Cloth  6 each Topical Q0600   darbepoetin (ARANESP) injection - DIALYSIS  40 mcg Subcutaneous Q Sat-1800   hydrocerin   Topical Daily   lidocaine  3 patch Transdermal Q24H   midodrine  15 mg Oral TID WC   polyethylene glycol  17 g Oral BID   senna-docusate  1 tablet Oral BID   sevelamer carbonate  4.8 g Oral TID WC   Warfarin - Pharmacist Dosing Inpatient   Does not apply q1600    Assessment: 70 YOM with pleural effusion. S/p chest tube placement and removal on 8/9. Was on warfarin PTA for history of afib which was resumed 8/9. Of note, patient was on amiodarone PTA.   -Patient is not compliant with anticoagulation follow-ups, but last home regimen was warfarin 2 mg daily. As outpatient has lasi couple INRs have been 1.5 or  less -INR today 1.4 (no real movement).  Of note, pt is on Augmentin which may potentiate effects of warfarin.  Addendum  Pt may need AKA. Heparin was ordered for bridging but pt has a hx of HIT. D/w Alanda Slim and we will use argatroban due to his ESRD.   Goal of Therapy:  aPTT 50-90 INR 2-3 Monitor platelets by anticoagulation protocol: Yes   Plan:  Dc coumadin Argatroban 44mcg/kg/min Check 4 hr PTT then daily  Ulyses Southward, PharmD, BCIDP, AAHIVP, CPP Infectious Disease Pharmacist 11/15/2022 2:10 PM

## 2022-11-15 NOTE — Progress Notes (Addendum)
  Progress Note    11/15/2022 6:39 AM Hospital Day 19  Subjective:  sleeping but wakes easily.    afebrile  Vitals:   11/14/22 2345 11/15/22 0400  BP: 96/65 106/62  Pulse: (!) 58 62  Resp: 16 19  Temp: 98.4 F (36.9 C) 98.3 F (36.8 C)  SpO2: 100% 96%    Physical Exam: General:  no distress Lungs:  non labored Extremities:  left foot wrapped and malodorous  CBC    Component Value Date/Time   WBC 10.3 11/15/2022 0108   RBC 3.48 (L) 11/15/2022 0108   HGB 9.7 (L) 11/15/2022 0108   HGB 11.4 (L) 08/31/2022 1137   HCT 33.3 (L) 11/15/2022 0108   HCT 34.7 (L) 08/31/2022 1137   PLT 133 (L) 11/15/2022 0108   PLT 135 (L) 08/31/2022 1137   MCV 95.7 11/15/2022 0108   MCV 93 08/31/2022 1137   MCH 27.9 11/15/2022 0108   MCHC 29.1 (L) 11/15/2022 0108   RDW 15.6 (H) 11/15/2022 0108   RDW 13.4 08/31/2022 1137   LYMPHSABS 0.8 11/07/2022 0048   LYMPHSABS 1.0 02/13/2017 1425   MONOABS 1.1 (H) 11/07/2022 0048   EOSABS 0.1 11/07/2022 0048   EOSABS 0.1 02/13/2017 1425   BASOSABS 0.0 11/07/2022 0048   BASOSABS 0.0 02/13/2017 1425    BMET    Component Value Date/Time   NA 136 11/15/2022 0108   NA 143 08/31/2022 1137   K 4.0 11/15/2022 0108   CL 98 11/15/2022 0108   CO2 25 11/15/2022 0108   GLUCOSE 87 11/15/2022 0108   BUN 43 (H) 11/15/2022 0108   BUN 57 (H) 08/31/2022 1137   CREATININE 6.66 (H) 11/15/2022 0108   CALCIUM 8.7 (L) 11/15/2022 0108   GFRNONAA 9 (L) 11/15/2022 0108   GFRAA 5 (L) 09/04/2019 1114    INR    Component Value Date/Time   INR 1.4 (H) 11/15/2022 9629     Assessment/Plan:  52 y.o. male with hx of aortic dissection with thoracic stent graft in 2004 in [place]. He has combined chronic venous insufficiency and PAD with chronic thickening of his skin with hyperkeratosis and new left heel ulceration.   Hospital Day 19  -new left heel wound and given pt is non ambulatory with knee contracture and does not walk or use his legs for standing or pivoting,  left AKA is recommended.  Pt continues to be hesitant about amputation.  Discussed with him that this could make him sick and be fatal without amputation.  Would probably benefit from palliative care consult to discuss goals of care.  -continue to float heels -left clavicular mass-subcu mass unchanged on CT scan and has been present since 2021-possible old hematoma. -type B aortic dissection not significantly changed when compared to prior CTA.   Doreatha Massed, PA-C [facility] [phone] 11/15/2022 6:39 AM  I have independently interviewed and examined patient on HD and agree with PA assessment and plan above.  Tentatively plan for left above-knee amputation on Thursday with Dr. Karin Lieu.  Patient desires to talk over with family tomorrow.   C. Randie Heinz, MD [facility] Office: [phone] Pager: [phone]

## 2022-11-15 NOTE — Progress Notes (Signed)
Inpatient Rehab Admissions Coordinator:    Discussed CIR admit and that rehab MD is reluctant to admit pt. While he's declining AKA. Pt. Stated he'd like to talk to MD more about surgery and may be open to procedure. I will follow up with him tomorrow.   Megan Salon, MS, CCC-SLP Rehab Admissions Coordinator  [phone] (celll) [phone] (office)

## 2022-11-16 ENCOUNTER — Inpatient Hospital Stay (HOSPITAL_COMMUNITY): Payer: 59

## 2022-11-16 ENCOUNTER — Other Ambulatory Visit (HOSPITAL_COMMUNITY): Payer: Self-pay

## 2022-11-16 DIAGNOSIS — N186 End stage renal disease: Secondary | ICD-10-CM | POA: Diagnosis not present

## 2022-11-16 DIAGNOSIS — I10 Essential (primary) hypertension: Secondary | ICD-10-CM

## 2022-11-16 DIAGNOSIS — Z01818 Encounter for other preprocedural examination: Secondary | ICD-10-CM | POA: Diagnosis not present

## 2022-11-16 DIAGNOSIS — J9601 Acute respiratory failure with hypoxia: Secondary | ICD-10-CM | POA: Diagnosis not present

## 2022-11-16 DIAGNOSIS — E43 Unspecified severe protein-calorie malnutrition: Secondary | ICD-10-CM | POA: Insufficient documentation

## 2022-11-16 DIAGNOSIS — I5032 Chronic diastolic (congestive) heart failure: Secondary | ICD-10-CM | POA: Diagnosis not present

## 2022-11-16 DIAGNOSIS — J181 Lobar pneumonia, unspecified organism: Secondary | ICD-10-CM | POA: Diagnosis not present

## 2022-11-16 LAB — ECHOCARDIOGRAM COMPLETE
AR max vel: 3.02 cm2
AV Peak grad: 3.5 mmHg
Ao pk vel: 0.93 m/s
Area-P 1/2: 2.8 cm2
Height: 75 in
S' Lateral: 2.3 cm
Weight: 2077.62 oz

## 2022-11-16 MED ORDER — ENSURE ENLIVE PO LIQD
237.0000 mL | Freq: Two times a day (BID) | ORAL | Status: DC
  Administered 2022-11-17 – 2022-11-18 (×2): 237 mL via ORAL

## 2022-11-16 MED ORDER — RENA-VITE PO TABS
1.0000 | ORAL_TABLET | Freq: Every day | ORAL | Status: DC
  Administered 2022-11-16 – 2022-11-28 (×13): 1 via ORAL
  Filled 2022-11-16 (×13): qty 1

## 2022-11-16 NOTE — Progress Notes (Signed)
PT Cancellation Note  Patient Details Name: Corey [name] MRN: [id number] DOB: 21-Sep-1970   Cancelled Treatment:    Reason Eval/Treat Not Completed: Patient declined, no reason specified; reports not in the right "headspace" as he is coping with news he will possibly have amputation tomorrow.  Encouraged him to continue to reach out to family and he felt it may help to speak to chaplain.  RN made aware.  Will continue attempts.    [name] 11/16/2022, 11:39 AM [name], PT Acute Rehabilitation Services Office:[phone] 11/16/2022

## 2022-11-16 NOTE — Progress Notes (Signed)
Initial Nutrition Assessment  DOCUMENTATION CODES:   Underweight, Severe malnutrition in context of chronic illness  INTERVENTION:   - Given SEVERE malnutrition and poor PO intake, liberalize diet to REGULAR to provide maximum options at mealtimes (potassium and phosphorus labs have been WNL since 10/27/22)  - Ensure Enlive po BID between meals, each supplement provides 350 kcal and 20 grams of protein  - Magic Cup TID with meals, each supplement provides 290 kcal and 9 grams of protein  - Renal MVI daily  - Encourage PO intake and provide feeding assistance as needed  - Education provided on the importance of increasing kcal and protein intake to prevent additional dry weight loss and promote post-op healing  - d/c PROSource Plus  NUTRITION DIAGNOSIS:   Severe Malnutrition related to chronic illness (ESRD, CHF, PVD) as evidenced by severe fat depletion, severe muscle depletion, percent weight loss (14.6% weight loss in 3 months).  GOAL:   Patient will meet greater than or equal to 90% of their needs  MONITOR:   PO intake, Supplement acceptance, Labs, Weight trends, Skin, I & O's  REASON FOR ASSESSMENT:   Consult Assessment of nutrition requirement/status  ASSESSMENT:   52 year old male who presented to the ED on 7/25 with chest pain and SOB. PMH of ESRD on HD, HTN, CHF, atrial fibrillation, previous aortic dissection, PVD, osteoarthritis, LUE calciphylaxis, blindness. Pt admitted with large loculated pleural effusion, acute hypoxic respiratory failure.  7/31 - s/p L chest tube placement 8/09 - chest tube removed  Per notes, pt is non-ambulatory with knee contracture and does not walk or use his legs for standing or pivoting. Vascular Surgery has recommended left AKA due to L heel necrosis. Pt has agreed and plan is for left AKA tomorrow in OR with Dr. Karin Lieu.  Last HD was on 8/13 with 2000 ml net UF. Post-HD weight was 58.9 which is well below EDW of 66 kg.  Spoke  with pt and multiple family members at bedside. Pt reports that appetite and PO intake have not been great this admission. Noted both an untouched breakfast meal tray and an untouched lunch meal tray in pt's room. Also noted stacks of food and snacks that appear to have been brought in by visitors.  Discussed importance of increasing kcal and protein intake to promote healing and prevent additional dry weight loss. Explained that pt's diet has been liberalized to Regular. Provided pt with a menu and took pt's lunch order given recent diet liberalization. Explained menu ordering system in detail to pt and family. Encouraged family to bring in outside food per pt's preferences.  Pt was surprised to find out that his current weight is both well below his EDW on admission of 66 kg and well below his admission weight of 62.9 kg. Explained to pt that he has lost dry weight due to inadequate nutrition. Pt became very motivated to improve his nutrition.  Pt willing to consume Ensure supplements and would like to try Magic Cups with meal trays. RD to order. Will also order daily renal MVI.  Reviewed weight history in chart. Pt with a 10.1 kg weight loss since 08/31/22. This is a 14.6% weight loss in less than 3 months which is severe and significant for timeframe. Based on weight loss and NFPE, pt meets criteria for severe malnutrition.  EDW: 66 kg Admit weight: 62.9 kg Current weight: 58.9 kg  Meal Completion:  8/6: 70%, 0%  8/7: no documentation  8/8: 0%, 0%  8/9: 100%  8/10: 0%  8/11: no documentation  8/12: no documentation  8/13: no documentation  Medications reviewed and include: PROSource Plus 30 ml BID, aranesp weekly, miralax, senna, renvela TID with meals, argatroban gtt  Labs reviewed: chloride 97, WBC 11.0, hemoglobin 9.7, platelets 136  NUTRITION - FOCUSED PHYSICAL EXAM:  Flowsheet Row Most Recent Value  Orbital Region Severe depletion  Upper Arm Region Severe depletion  Thoracic  and Lumbar Region Severe depletion  Buccal Region Severe depletion  Temple Region Severe depletion  Clavicle Bone Region Severe depletion  Clavicle and Acromion Bone Region Severe depletion  Scapular Bone Region Severe depletion  Dorsal Hand Severe depletion  Patellar Region Severe depletion  Anterior Thigh Region Severe depletion  Posterior Calf Region Unable to assess  Edema (RD Assessment) Unable to assess  Hair Reviewed  Eyes Reviewed  Mouth Reviewed  Skin Reviewed  Nails Reviewed    Diet Order:   Diet Order             Diet NPO time specified  Diet effective midnight           Diet regular Room service appropriate? Yes; Fluid consistency: Thin; Fluid restriction: 1200 mL Fluid  Diet effective now                   EDUCATION NEEDS:   Education needs have been addressed  Skin:  Skin Assessment: Skin Integrity Issues: Other: necrosis to BLE  Last BM:  11/13/22  Height:   Ht Readings from Last 1 Encounters:  10/27/22 6\' 3"  (1.905 m)    Weight:   Wt Readings from Last 1 Encounters:  11/15/22 58.9 kg    Ideal Body Weight:  89.1 kg  BMI:  Body mass index is 16.23 kg/m.  Estimated Nutritional Needs:   Kcal:  2000-2200  Protein:  95-115 grams  Fluid:  1000 ml + UOP    Mertie Clause, MS, RD, LDN Registered Dietitian II Please see AMiON for contact information.

## 2022-11-16 NOTE — Progress Notes (Signed)
  [facility] Progress Note   Subjective:  No complaints.  Tolerated dialysis well yesterday.  Plans for AKA tomorrow  Objective Vitals:   11/16/22 0500 11/16/22 0600 11/16/22 0606 11/16/22 0831  BP: (!) 82/51 100/65 100/65 (!) 84/53  Pulse: (!) 58 60 60 (!) 59  Resp:    19  Temp:   98.2 F (36.8 C) 97.8 F (36.6 C)  TempSrc:   Oral Oral  SpO2: 96% 96% 98% 94%  Weight:      Height:       Physical Exam General: lying in bed, chronically ill appearing, no distress Heart: no rub, normal rate Lungs: bilateral chest rise, no iwob Abdomen: soft Extremities: bilateral venous stasis changes, left leg with wound,, no edema Dialysis Access: TDC  c/d/i  OP HD: TTS [facility] 4h  2/2 bath  66kg   TDC   no heparin (allergy)  lock TDC w/ citrate - No ESA or VDRA - Binder: Renvela pwdr 4.8g TID - Sensipar 180mg  every day at home   Assessment/Plan: Acute hypoxic respiratory failure: In setting of missed HD, pna w/ effusion. CXR with pleural effusion s/p chest tube 7/31 now removed. Resolved. PNA/ L parapneumonic pleural effusion: s/p thoracentesis with cultures. Got IV abx course, now on augmentin thru 8/14.  ESRD: maintain TTS dialysis schedule Heparin allergy - use citrate for cath lock Hypotension/ volume: BP low, LE edema resolved.  Is on midodrine. Will need lower EDW after discharge Anemia of ESRD:  Hgb 9-10s - s/p Aranesp on 7/27.  CTM Secondary HPTH: Ca/Phos ok - continue home binders.  Held cinacalcet due to hypoCa but corrects near normal when accounting for albumin. CTM for now A-fib: On amiodarone + warfarin, has IVC filter Hx type B aortic dissection s/p repair (2004)  Left leg wound - VVS suggesting aka, plan for the OR tomorrow    11/16/2022, 10:29 AM  Recent Labs  Lab 11/14/22 0038 11/15/22 0108 11/16/22 0722  HGB 9.7* 9.7* 9.7*  ALBUMIN 2.4*  --  2.5*  CALCIUM 8.3* 8.7* 8.8*  PHOS 3.6  --  3.4  CREATININE 5.28* 6.66* 4.38*  K 3.9 4.0 4.0     Inpatient medications:  (feeding supplement) PROSource Plus  30 mL Oral BID BM   amiodarone  200 mg Oral Daily   amoxicillin-clavulanate  1 tablet Oral QHS   Chlorhexidine Gluconate Cloth  6 each Topical Q0600   darbepoetin (ARANESP) injection - DIALYSIS  40 mcg Subcutaneous Q Sat-1800   hydrocerin   Topical Daily   lidocaine  3 patch Transdermal Q24H   midodrine  15 mg Oral TID WC   polyethylene glycol  17 g Oral BID   senna-docusate  1 tablet Oral BID   sevelamer carbonate  4.8 g Oral TID WC    anticoagulant sodium citrate 100 mL/hr at 11/16/22 0600   argatroban 1 mcg/kg/min (11/16/22 0600)   acetaminophen, anticoagulant sodium citrate, calcium carbonate (dosed in mg elemental calcium), camphor-menthol **AND** hydrOXYzine, docusate sodium, HYDROmorphone (DILAUDID) injection, ondansetron **OR** ondansetron (ZOFRAN) IV, oxyCODONE, sorbitol, zolpidem

## 2022-11-16 NOTE — Progress Notes (Signed)
Echocardiogram 2D Echocardiogram has been performed.  Corey [name] 11/16/2022, 3:52 PM

## 2022-11-16 NOTE — Progress Notes (Signed)
Inpatient Rehab Admissions Coordinator:   CIR following. Case withdrawn from insurance, Pt. Possibly pending AKA this week. Will follow up post op  Megan Salon, MS, CCC-SLP Rehab Admissions Coordinator  [phone] (celll) [phone] (office)

## 2022-11-16 NOTE — Progress Notes (Signed)
PROGRESS NOTE  Corey [name] MVH:[id number] DOB: July 12, 1970   PCP: [name], NP  Patient is from: Home  DOA: 10/27/2022 LOS: 20  Chief complaints Chief Complaint  Patient presents with   Flank Pain   Shoulder Pain     Brief Narrative / Interim history: 52 year old M with PMH of ESRD on HD TTS, calciphylaxis, paroxysmal A-fib on warfarin, type B aortic dissection, PAD, chronic LE edema, severe malnutrition, blindness and nonambulatory status presenting to the hospital with SOB and several days of left shoulder, chest and flank pain.  As a result of pain, he missed 2 HD sessions.  Per EMS, he was saturating at 85% on RA.  CT chest/abdomen/pelvis showed moderate to large loculated left-sided pleural effusion with lower lobe consolidation.  He was admitted for acute respiratory failure with hypoxia due to left lobar pneumonia with parapneumonic effusion.  PCCM consulted, and had a chest tube placed on 7/31 after INR reversal with vitamin K.  He required multiple rounds of lytics.  Pleural fluid cultures NGTD.  Started on ceftriaxone and Zithromax on 8/1.  Transitioned to p.o. Augmentin on 8/9 that he will continue until 8/15.  Chest tube removed on 8/9.  Found to have left heel necrosis on 8/12.  Vascular surgery consulted and recommended left AKA which is planned for 8/15.  Therapy recommended CIR.  CIR following.   Subjective: Seen and examined earlier this morning.  No major events overnight of this morning.  Worried about losing his leg.  Objective: Vitals:   11/16/22 0600 11/16/22 0606 11/16/22 0831 11/16/22 1712  BP: 100/65 100/65 (!) 84/53 (!) 90/57  Pulse: 60 60 (!) 59 (!) 58  Resp:   19 18  Temp:  98.2 F (36.8 C) 97.8 F (36.6 C) 98 F (36.7 C)  TempSrc:  Oral Oral Oral  SpO2: 96% 98% 94% 100%  Weight:      Height:        Examination:  GENERAL: No apparent distress.  Nontoxic. HEENT: MMM.  Hearing grossly intact.  Legally blind. NECK: Supple.  No apparent  JVD.  RESP:  No IWOB.  Fair aeration bilaterally. CVS:  RRR. Heart sounds normal.  ABD/GI/GU: BS+. Abd soft, NTND.  MSK/EXT: Significant muscle mass and subcu fat loss.  Dressing over left heel with some stains.  Hypertrophic nails.  Difficult to palpate pulses. SKIN: Tree bark skin is in BLE as seen in picture below. NEURO: Awake, alert and oriented appropriately.  No apparent focal neuro deficit. PSYCH: Calm. Normal affect.     Procedures:  7/31-8/9-left chest tube  Microbiology summarized: 7/26-MRSA PCR screen nonreactive 7/31-pleural fluid culture NGTD 7/31-pleural fluid AFB culture pending.  Smear negative. 7/31-pleural fluid fungal culture pending.  Assessment and plan: Principal Problem:   Acute respiratory failure with hypoxia (HCC) Active Problems:   ESRD on dialysis [facility])   Calciphylaxis   Essential hypertension   Chronic diastolic CHF (congestive heart failure) (HCC)   Atrial fibrillation, chronic (HCC)   End-stage renal disease on hemodialysis (HCC)   AAA (abdominal aortic aneurysm) without rupture (HCC)   Hyperkalemia   PVD (peripheral vascular disease) (HCC)   Constipation   Lobar pneumonia (HCC)   Parapneumonic effusion   Gangrene (HCC)   Protein-calorie malnutrition, severe  Acute respiratory failure with hypoxia due to left lower lobe pneumonia with parapneumonic effusion and fluid overload: TTE with LVEF of 50 to 55% and severely enlarged RV and RVSP of 34.4 mmHg.  Resolved.  On room air. -Treated with  dialysis, left chest tube placement and antibiotics. -Chest tube removed 8/9. -Initially started on azithromycin and ceftriaxone on 8/1.  Transition to p.o. Augmentin on 8/9. -Fluid management by dialysis   ESRD on HD TTS: Reportedly missed 2 HD sessions due to pain. -Dialysis per nephrology  Acute on chronic diastolic CHF/PAH: TTE TTE with LVEF of 50 to 55% and severely enlarged RV and RVSP of 34.4 mmHg (60 to 65%,  indeterminate DD and RVSP of 42.8  mmHg in 2020).  Currently appears euvolemic. -Fluid management by dialysis per nephrology.    Gangrene of left heel/peripheral artery disease -Vascular surgery planning AKA on 8/15. -Continue holding warfarin  Chronic pedal edema/ Venous stasis/calciphylaxis of lower extremity Hypertrophic toenails -cont Eucerin and Sarna lotion on legs -May ask podiatry if they can trim toenails on right foot after left AKA.    Mass in left subclavicular region- Sebaceous cyst? According to the patient and family, this mass is new, however, according to the CT scan that was performed today, the mass has been present since 2021 and is unchanged   Paroxysmal atrial fibrillation: Rate controlled.  History of HIT. -Continue amiodarone -Hold warfarin.  Continue argatroban.   Chronic hypotension: Soft blood pressures.  TTE as above. -Continue midodrine  Type B thoracic aortic dissection with aneurysmal dilatation of the proximal descending thoracic aorta up to 4.5 cm in diameter.  Legally blind  Anemia of chronic disease: Stable. Recent Labs    11/05/22 0058 11/06/22 0138 11/07/22 0048 11/08/22 0332 11/09/22 0221 11/11/22 0008 11/12/22 0049 11/14/22 0038 11/15/22 0108 11/16/22 0722  HGB 9.6* 9.6* 10.1* 10.2* 9.8* 10.9* 10.7* 9.7* 9.7* 9.7*  -Per nephrology  Bedbound/wheelchair-bound -PT/OT after surgery  Severe malnutrition Body mass index is 16.23 kg/m. Nutrition Problem: Severe Malnutrition Etiology: chronic illness (ESRD, CHF, PVD) Signs/Symptoms: severe fat depletion, severe muscle depletion, percent weight loss (14.6% weight loss in 3 months) Percent weight loss: 14.6 % Interventions: Ensure Enlive (each supplement provides 350kcal and 20 grams of protein), Magic cup, MVI, Liberalize Diet, Education   DVT prophylaxis:  On full dose anticoagulation  Code Status: Full code Family Communication: None at bedside.  Updated patient's brother and cousin over the phone with  patient's permission. Level of care: Telemetry Cardiac Status is: Inpatient Remains inpatient appropriate because: Respiratory failure with LLL pneumonia and parapneumonic effusion, left heel gangrene/ulcer   Final disposition: CIR Consultants:  Nephrology Vascular surgery  55 minutes with more than 50% spent in reviewing records, counseling patient/family and coordinating care.   Sch Meds:  Scheduled Meds:  amiodarone  200 mg Oral Daily   amoxicillin-clavulanate  1 tablet Oral QHS   Chlorhexidine Gluconate Cloth  6 each Topical Q0600   darbepoetin (ARANESP) injection - DIALYSIS  40 mcg Subcutaneous Q Sat-1800   feeding supplement  237 mL Oral BID BM   hydrocerin   Topical Daily   lidocaine  3 patch Transdermal Q24H   midodrine  15 mg Oral TID WC   multivitamin  1 tablet Oral QHS   polyethylene glycol  17 g Oral BID   senna-docusate  1 tablet Oral BID   sevelamer carbonate  4.8 g Oral TID WC   Continuous Infusions:  anticoagulant sodium citrate 100 mL/hr at 11/16/22 1402   argatroban 1 mcg/kg/min (11/16/22 1234)   PRN Meds:.acetaminophen, anticoagulant sodium citrate, calcium carbonate (dosed in mg elemental calcium), camphor-menthol **AND** hydrOXYzine, docusate sodium, HYDROmorphone (DILAUDID) injection, ondansetron **OR** ondansetron (ZOFRAN) IV, oxyCODONE, sorbitol, zolpidem  Antimicrobials: Anti-infectives (From admission, onward)  Start     Dose/Rate Route Frequency Ordered Stop   11/11/22 2200  amoxicillin-clavulanate (AUGMENTIN) 500-125 MG per tablet 1 tablet        1 tablet Oral Daily at bedtime 11/11/22 1316 11/17/22 2159   11/08/22 1800  cefTRIAXone (ROCEPHIN) 2 g in sodium chloride 0.9 % 100 mL IVPB  Status:  Discontinued        2 g 200 mL/hr over 30 Minutes Intravenous Every 24 hours 11/08/22 1656 11/11/22 1316   11/03/22 0915  cefTRIAXone (ROCEPHIN) 2 g in sodium chloride 0.9 % 100 mL IVPB        2 g 200 mL/hr over 30 Minutes Intravenous Every 24 hours  11/03/22 0823 11/08/22 1701   11/03/22 0915  azithromycin (ZITHROMAX) 500 mg in sodium chloride 0.9 % 250 mL IVPB        500 mg 250 mL/hr over 60 Minutes Intravenous Every 24 hours 11/03/22 0823 11/08/22 1701        I have personally reviewed the following labs and images: CBC: Recent Labs  Lab 11/11/22 0008 11/12/22 0049 11/14/22 0038 11/15/22 0108 11/16/22 0722  WBC 9.2 9.4 11.7* 10.3 11.0*  HGB 10.9* 10.7* 9.7* 9.7* 9.7*  HCT 37.5* 36.4* 32.7* 33.3* 33.2*  MCV 96.6 97.1 98.8 95.7 95.1  PLT 143* 132* 144* 133* 136*   BMP &GFR Recent Labs  Lab 11/11/22 0008 11/12/22 0049 11/14/22 0038 11/15/22 0108 11/16/22 0722  NA 135 136 133* 136 135  K 3.8 4.1 3.9 4.0 4.0  CL 93* 96* 96* 98 97*  CO2 26 23 27 25 25   GLUCOSE 91 81 101* 87 102*  BUN 28* 41* 32* 43* 26*  CREATININE 4.76* 6.26* 5.28* 6.66* 4.38*  CALCIUM 8.2* 8.5* 8.3* 8.7* 8.8*  MG  --   --   --   --  2.0  PHOS  --   --  3.6  --  3.4   Estimated Creatinine Clearance: 16.4 mL/min (A) (by C-G formula based on SCr of 4.38 mg/dL (H)). Liver & Pancreas: Recent Labs  Lab 11/14/22 0038 11/16/22 0722  ALBUMIN 2.4* 2.5*   No results for input(s): "LIPASE", "AMYLASE" in the last 168 hours. No results for input(s): "AMMONIA" in the last 168 hours. Diabetic: No results for input(s): "HGBA1C" in the last 72 hours. No results for input(s): "GLUCAP" in the last 168 hours. Cardiac Enzymes: No results for input(s): "CKTOTAL", "CKMB", "CKMBINDEX", "TROPONINI" in the last 168 hours. No results for input(s): "PROBNP" in the last 8760 hours. Coagulation Profile: Recent Labs  Lab 11/11/22 1120 11/12/22 0049 11/13/22 0059 11/14/22 0038 11/15/22 0108  INR 1.2 1.4* 1.4* 1.4* 1.4*   Thyroid Function Tests: No results for input(s): "TSH", "T4TOTAL", "FREET4", "T3FREE", "THYROIDAB" in the last 72 hours. Lipid Profile: No results for input(s): "CHOL", "HDL", "LDLCALC", "TRIG", "CHOLHDL", "LDLDIRECT" in the last 72  hours. Anemia Panel: No results for input(s): "VITAMINB12", "FOLATE", "FERRITIN", "TIBC", "IRON", "RETICCTPCT" in the last 72 hours. Urine analysis: No results found for: "COLORURINE", "APPEARANCEUR", "LABSPEC", "PHURINE", "GLUCOSEU", "HGBUR", "BILIRUBINUR", "KETONESUR", "PROTEINUR", "UROBILINOGEN", "NITRITE", "LEUKOCYTESUR" Sepsis Labs: Invalid input(s): "PROCALCITONIN", "LACTICIDVEN"  Microbiology: No results found for this or any previous visit (from the past 240 hour(s)).  Radiology Studies: ECHOCARDIOGRAM COMPLETE  Result Date: 11/16/2022    ECHOCARDIOGRAM REPORT   Patient Name:   [name] Date of Exam: 11/16/2022 Medical Rec #:  [id number]    Height:       75.0 in Accession #:    [id number]  Weight:       129.9 lb Date of Birth:  11/29/1970    BSA:          1.826 m Patient Age:    52 years     BP:           84/53 mmHg Patient Gender: M            HR:           62 bpm. Exam Location:  Inpatient Procedure: 2D Echo, Cardiac Doppler and Color Doppler Indications:    Preoperative evaluation  History:        Patient has prior history of Echocardiogram examinations, most                 recent 05/20/2018. CHF, Arrythmias:Atrial Fibrillation,                 Signs/Symptoms:Chest Pain; Risk Factors:Hypertension. ESRD.  Sonographer:    Lucendia Herrlich Referring Phys: [id number]  T  IMPRESSIONS  1. Left ventricular ejection fraction, by estimation, is 50 to 55%. The left ventricle has low normal function. The left ventricle has no regional wall motion abnormalities. Left ventricular diastolic parameters are indeterminate. There is the interventricular septum is flattened in systole and diastole, consistent with right ventricular pressure and volume overload.  2. Right ventricular systolic function is severely reduced. The right ventricular size is severely enlarged. Tricuspid regurgitation signal is inadequate for assessing PA pressure.  3. Left atrial size was mildly dilated.  4. Right atrial size  was severely dilated.  5. The mitral valve is normal in structure. No evidence of mitral valve regurgitation. No evidence of mitral stenosis.  6. The aortic valve is tricuspid. Aortic valve regurgitation is not visualized. No aortic stenosis is present.  7. The inferior vena cava is normal in size with <50% respiratory variability, suggesting right atrial pressure of 8 mmHg. FINDINGS  Left Ventricle: Left ventricular ejection fraction, by estimation, is 50 to 55%. The left ventricle has low normal function. The left ventricle has no regional wall motion abnormalities. The left ventricular internal cavity size was normal in size. There is no left ventricular hypertrophy. The interventricular septum is flattened in systole and diastole, consistent with right ventricular pressure and volume overload. Left ventricular diastolic parameters are indeterminate. Right Ventricle: The right ventricular size is severely enlarged. No increase in right ventricular wall thickness. Right ventricular systolic function is severely reduced. Tricuspid regurgitation signal is inadequate for assessing PA pressure. The tricuspid regurgitant velocity is 2.57 m/s, and with an assumed right atrial pressure of 8 mmHg, the estimated right ventricular systolic pressure is 34.4 mmHg. Left Atrium: Left atrial size was mildly dilated. Right Atrium: Right atrial size was severely dilated. Pericardium: Trivial pericardial effusion is present. Mitral Valve: The mitral valve is normal in structure. No evidence of mitral valve regurgitation. No evidence of mitral valve stenosis. Tricuspid Valve: The tricuspid valve is normal in structure. Tricuspid valve regurgitation is mild . No evidence of tricuspid stenosis. Aortic Valve: The aortic valve is tricuspid. Aortic valve regurgitation is not visualized. No aortic stenosis is present. Aortic valve peak gradient measures 3.5 mmHg. Pulmonic Valve: The pulmonic valve was normal in structure. Pulmonic valve  regurgitation is trivial. No evidence of pulmonic stenosis. Aorta: The aortic root is normal in size and structure. Venous: The inferior vena cava is normal in size with less than 50% respiratory variability, suggesting right atrial pressure of 8 mmHg. IAS/Shunts: There is left bowing of the interatrial  septum, suggestive of elevated right atrial pressure. No atrial level shunt detected by color flow Doppler.  LEFT VENTRICLE PLAX 2D LVIDd:         3.80 cm   Diastology LVIDs:         2.30 cm   LV e' medial:    6.01 cm/s LV PW:         1.10 cm   LV E/e' medial:  8.7 LV IVS:        1.10 cm   LV e' lateral:   11.30 cm/s LVOT diam:     2.20 cm   LV E/e' lateral: 4.6 LV SV:         47 LV SV Index:   26 LVOT Area:     3.80 cm  RIGHT VENTRICLE             IVC RV S prime:     10.30 cm/s  IVC diam: 2.00 cm TAPSE (M-mode): 1.4 cm LEFT ATRIUM             Index        RIGHT ATRIUM           Index LA diam:        4.40 cm 2.41 cm/m   RA Area:     23.80 cm LA Vol (A2C):   42.9 ml 23.49 ml/m  RA Volume:   83.30 ml  45.61 ml/m LA Vol (A4C):   62.3 ml 34.11 ml/m LA Biplane Vol: 56.2 ml 30.77 ml/m  AORTIC VALVE                 PULMONIC VALVE AV Area (Vmax): 3.02 cm     PR End Diast Vel: 14.14 msec AV Vmax:        92.90 cm/s AV Peak Grad:   3.5 mmHg LVOT Vmax:      73.90 cm/s LVOT Vmean:     44.100 cm/s LVOT VTI:       0.123 m  AORTA Ao Root diam: 3.50 cm Ao Asc diam:  3.10 cm MITRAL VALVE               TRICUSPID VALVE MV Area (PHT): 2.80 cm    TR Peak grad:   26.4 mmHg MV Decel Time: 271 msec    TR Vmax:        257.00 cm/s MV E velocity: 52.20 cm/s MV A velocity: 25.70 cm/s  SHUNTS MV E/A ratio:  2.03        Systemic VTI:  0.12 m                            Systemic Diam: 2.20 cm Olga Millers MD Electronically signed by Olga Millers MD Signature Date/Time: 11/16/2022/4:07:01 PM    Final        T.  [facility]  If 7PM-7AM, please contact night-coverage [email or web address] 11/16/2022, 5:52 PM

## 2022-11-16 NOTE — Progress Notes (Signed)
ANTICOAGULATION CONSULT NOTE - Follow Up Consult  Pharmacy Consult for warfarin>>argatroban Indication: atrial fibrillation  Allergies  Allergen Reactions   Ciprofloxacin Other (See Comments)    Aortic dissection   Heparin Other (See Comments)    UNSPECIFIED REACTION :  On Coumadin since 2004   HIT panel negative 01/19/17   Doxercalciferol Other (See Comments)   Quinolones Other (See Comments)    unknown    Patient Measurements: Height: 6\' 3"  (190.5 cm) Weight: 58.9 kg (129 lb 13.6 oz) IBW/kg (Calculated) : 84.5  Vital Signs: Temp: 98.2 F (36.8 C) (08/14 0606) Temp Source: Oral (08/14 0606) BP: 100/65 (08/14 0606) Pulse Rate: 60 (08/14 0606)  Labs: Recent Labs    11/14/22 0038 11/15/22 0108  HGB 9.7* 9.7*  HCT 32.7* 33.3*  PLT 144* 133*  LABPROT 17.8* 17.4*  INR 1.4* 1.4*  CREATININE 5.28* 6.66*    Estimated Creatinine Clearance: 10.8 mL/min (A) (by C-G formula based on SCr of 6.66 mg/dL (H)).   Medications:  Scheduled:   (feeding supplement) PROSource Plus  30 mL Oral BID BM   amiodarone  200 mg Oral Daily   amoxicillin-clavulanate  1 tablet Oral QHS   Chlorhexidine Gluconate Cloth  6 each Topical Q0600   darbepoetin (ARANESP) injection - DIALYSIS  40 mcg Subcutaneous Q Sat-1800   hydrocerin   Topical Daily   lidocaine  3 patch Transdermal Q24H   midodrine  15 mg Oral TID WC   polyethylene glycol  17 g Oral BID   senna-docusate  1 tablet Oral BID   sevelamer carbonate  4.8 g Oral TID WC    Assessment: 52 YOM with pleural effusion. S/p chest tube placement and removal on 8/9. Was on warfarin PTA for history of afib which was resumed 8/9. Of note, patient was on amiodarone PTA. Patient is not compliant with anticoagulation follow-ups, but last home regimen was warfarin 2 mg daily. As outpatient has lasi couple INRs have been 1.5 or less.  He is now on argatroban (history of HIT noted) for likely AKA on 8/15 and warfarin on hold -INR= 1.4, aPTT 62 and  at goal   Goal of Therapy:  aPTT 50-90 INR 2-3 Monitor platelets by anticoagulation protocol: Yes   Plan:  Continue Argatroban 73mcg/kg/min Daily aPTT, CBC and INR Post procedure consider no anticoagulation bridge (CHADSVASC ~2)  Harland German, PharmD Clinical Pharmacist **Pharmacist phone directory can now be found on [email or web address] ([email or web address]).  Listed under [facility].

## 2022-11-16 NOTE — Progress Notes (Signed)
ANTICOAGULATION CONSULT NOTE - Follow Up Consult  Pharmacy Consult for warfarin>>argatroban Indication: atrial fibrillation  Allergies  Allergen Reactions   Ciprofloxacin Other (See Comments)    Aortic dissection   Heparin Other (See Comments)    UNSPECIFIED REACTION :  On Coumadin since 2004   HIT panel negative 01/19/17   Doxercalciferol Other (See Comments)   Quinolones Other (See Comments)    unknown    Patient Measurements: Height: 6\' 3"  (190.5 cm) Weight: 58.9 kg (129 lb 13.6 oz) IBW/kg (Calculated) : 84.5  Vital Signs: Temp: 98.2 F (36.8 C) (08/14 0606) Temp Source: Oral (08/14 0606) BP: 100/65 (08/14 0606) Pulse Rate: 60 (08/14 0606)  Labs: Recent Labs    11/14/22 0038 11/15/22 0108  HGB 9.7* 9.7*  HCT 32.7* 33.3*  PLT 144* 133*  LABPROT 17.8* 17.4*  INR 1.4* 1.4*  CREATININE 5.28* 6.66*    Estimated Creatinine Clearance: 10.8 mL/min (A) (by C-G formula based on SCr of 6.66 mg/dL (H)).   Medications:  Scheduled:   (feeding supplement) PROSource Plus  30 mL Oral BID BM   amiodarone  200 mg Oral Daily   amoxicillin-clavulanate  1 tablet Oral QHS   Chlorhexidine Gluconate Cloth  6 each Topical Q0600   darbepoetin (ARANESP) injection - DIALYSIS  40 mcg Subcutaneous Q Sat-1800   hydrocerin   Topical Daily   lidocaine  3 patch Transdermal Q24H   midodrine  15 mg Oral TID WC   polyethylene glycol  17 g Oral BID   senna-docusate  1 tablet Oral BID   sevelamer carbonate  4.8 g Oral TID WC    Assessment: 52 YOM with pleural effusion. S/p chest tube placement and removal on 8/9. Was on warfarin PTA for history of afib which was resumed 8/9. Of note, patient was on amiodarone PTA. Patient is not compliant with anticoagulation follow-ups, but last home regimen was warfarin 2 mg daily. As outpatient has lasi couple INRs have been 1.5 or less.  He is now on argatroban (history of HIT noted) for likely AKA on 8/15 and warfarin on hold -INR= 1.4, aPTT 62 and  at goal   Goal of Therapy:  aPTT 50-90 INR 2-3 Monitor platelets by anticoagulation protocol: Yes   Plan:  -Continue Argatroban 62mcg/kg/min -Daily aPTT, CBC and INR -Post procedure consider no anticoagulation bridge (CHADSVASC ~2) -Of note, the cost of apixaban is $0, would consider changing oral agents when restart oral anticoagulation  Harland German, PharmD Clinical Pharmacist **Pharmacist phone directory can now be found on [email or web address] ([email or web address]).  Listed under [facility].

## 2022-11-16 NOTE — TOC Benefit Eligibility Note (Signed)
Patient Product/process development scientist completed.    The patient is insured through [facility]. Patient has Medicare and is not eligible for a copay card, but may be able to apply for patient assistance, if available.    Ran test claim for Eliquis 5 mg and the current 30 day co-pay is $0.00.   This test claim was processed through [facility]- copay amounts may vary at other pharmacies due to pharmacy/plan contracts, or as the patient moves through the different stages of their insurance plan.     Roland Earl, CPHT Pharmacy Patient Advocate Specialist [facility] Patient Advocate Team Direct Number: [phone]  Fax: [phone]

## 2022-11-16 NOTE — Progress Notes (Addendum)
  Progress Note    11/16/2022 6:32 AM Hospital Day 20  Subjective:  says he's not doing well this morning bc they are going to "take off my foot".  When we discussed timing, he didn't realize it could possibly be tomorrow.    afebrile  Vitals:   11/16/22 0600 11/16/22 0606  BP: 100/65 100/65  Pulse: 60 60  Resp:    Temp:  98.2 F (36.8 C)  SpO2: 96% 98%    Physical Exam: General:  no distress Lungs:  non labored   CBC    Component Value Date/Time   WBC 10.3 11/15/2022 0108   RBC 3.48 (L) 11/15/2022 0108   HGB 9.7 (L) 11/15/2022 0108   HGB 11.4 (L) 08/31/2022 1137   HCT 33.3 (L) 11/15/2022 0108   HCT 34.7 (L) 08/31/2022 1137   PLT 133 (L) 11/15/2022 0108   PLT 135 (L) 08/31/2022 1137   MCV 95.7 11/15/2022 0108   MCV 93 08/31/2022 1137   MCH 27.9 11/15/2022 0108   MCHC 29.1 (L) 11/15/2022 0108   RDW 15.6 (H) 11/15/2022 0108   RDW 13.4 08/31/2022 1137   LYMPHSABS 0.8 11/07/2022 0048   LYMPHSABS 1.0 02/13/2017 1425   MONOABS 1.1 (H) 11/07/2022 0048   EOSABS 0.1 11/07/2022 0048   EOSABS 0.1 02/13/2017 1425   BASOSABS 0.0 11/07/2022 0048   BASOSABS 0.0 02/13/2017 1425    BMET    Component Value Date/Time   NA 136 11/15/2022 0108   NA 143 08/31/2022 1137   K 4.0 11/15/2022 0108   CL 98 11/15/2022 0108   CO2 25 11/15/2022 0108   GLUCOSE 87 11/15/2022 0108   BUN 43 (H) 11/15/2022 0108   BUN 57 (H) 08/31/2022 1137   CREATININE 6.66 (H) 11/15/2022 0108   CALCIUM 8.7 (L) 11/15/2022 0108   GFRNONAA 9 (L) 11/15/2022 0108   GFRAA 5 (L) 09/04/2019 1114    INR    Component Value Date/Time   INR 1.4 (H) 11/15/2022 0108     Intake/Output Summary (Last 24 hours) at 11/16/2022 4010 Last data filed at 11/15/2022 1807 Gross per 24 hour  Intake 0 ml  Output 2000 ml  Net -2000 ml     Assessment/Plan:  52 y.o. male with hx of aortic dissection with thoracic stent graft in 2004 in [place]. He has combined chronic venous insufficiency and PAD with chronic thickening  of his skin with hyperkeratosis and new left heel ulceration   Hospital Day 20  -pt seems to be more amendable to amputation but was surprised that it could be tomorrow.  -he is going to further discuss with his family today. -MD will be by to have further discussions later today. -pt on coumadin-INR today is 1.4.  would hold this in anticipation of possible surgery tomorrow.     Doreatha Massed, PA-C [facility] [phone] 11/16/2022 6:32 AM  I have independently interviewed and examined patient and agree with PA assessment and plan above.  Plan for left above-knee amputation tomorrow in the OR with Dr. Karin Lieu.  I discussed with the patient that he is at risk for bilateral amputations in the future and he demonstrates good understanding.   C. Randie Heinz, MD [facility] [place] Office: [phone] Pager: [phone]

## 2022-11-17 ENCOUNTER — Inpatient Hospital Stay (HOSPITAL_COMMUNITY): Payer: 59 | Admitting: Anesthesiology

## 2022-11-17 ENCOUNTER — Encounter (HOSPITAL_COMMUNITY): Payer: Self-pay | Admitting: Internal Medicine

## 2022-11-17 ENCOUNTER — Encounter (HOSPITAL_COMMUNITY)
Admission: EM | Disposition: A | Discharge status: Rehabilitation Facility | Payer: Self-pay | Source: Home / Self Care | Attending: Student

## 2022-11-17 ENCOUNTER — Other Ambulatory Visit (HOSPITAL_COMMUNITY): Payer: Self-pay

## 2022-11-17 ENCOUNTER — Other Ambulatory Visit: Payer: Self-pay

## 2022-11-17 DIAGNOSIS — I132 Hypertensive heart and chronic kidney disease with heart failure and with stage 5 chronic kidney disease, or end stage renal disease: Secondary | ICD-10-CM

## 2022-11-17 DIAGNOSIS — J181 Lobar pneumonia, unspecified organism: Secondary | ICD-10-CM | POA: Diagnosis not present

## 2022-11-17 DIAGNOSIS — I5032 Chronic diastolic (congestive) heart failure: Secondary | ICD-10-CM | POA: Diagnosis not present

## 2022-11-17 DIAGNOSIS — Z992 Dependence on renal dialysis: Secondary | ICD-10-CM

## 2022-11-17 DIAGNOSIS — J9601 Acute respiratory failure with hypoxia: Secondary | ICD-10-CM | POA: Diagnosis not present

## 2022-11-17 DIAGNOSIS — T8754 Necrosis of amputation stump, left lower extremity: Secondary | ICD-10-CM | POA: Diagnosis not present

## 2022-11-17 DIAGNOSIS — N186 End stage renal disease: Secondary | ICD-10-CM | POA: Diagnosis not present

## 2022-11-17 DIAGNOSIS — I998 Other disorder of circulatory system: Secondary | ICD-10-CM

## 2022-11-17 HISTORY — PX: AMPUTATION: SHX166

## 2022-11-17 LAB — BASIC METABOLIC PANEL
Anion gap: 11 (ref 5–15)
BUN: 33 mg/dL — ABNORMAL HIGH (ref 6–20)
CO2: 26 mmol/L (ref 22–32)
Calcium: 9 mg/dL (ref 8.9–10.3)
Chloride: 101 mmol/L (ref 98–111)
Creatinine, Ser: 5.59 mg/dL — ABNORMAL HIGH (ref 0.61–1.24)
GFR, Estimated: 11 mL/min — ABNORMAL LOW (ref >60)
Glucose, Bld: 86 mg/dL (ref 70–99)
Potassium: 4.3 mmol/L (ref 3.5–5.1)
Sodium: 138 mmol/L (ref 135–145)

## 2022-11-17 LAB — CBC
HCT: 33 % — ABNORMAL LOW (ref 39.0–52.0)
Hemoglobin: 9.6 g/dL — ABNORMAL LOW (ref 13.0–17.0)
MCH: 27.6 pg (ref 26.0–34.0)
MCHC: 29.1 g/dL — ABNORMAL LOW (ref 30.0–36.0)
MCV: 94.8 fL (ref 80.0–100.0)
Platelets: 146 10*3/uL — ABNORMAL LOW (ref 150–400)
RBC: 3.48 MIL/uL — ABNORMAL LOW (ref 4.22–5.81)
RDW: 15.6 % — ABNORMAL HIGH (ref 11.5–15.5)
WBC: 9.3 10*3/uL (ref 4.0–10.5)
nRBC: 0.3 % — ABNORMAL HIGH (ref 0.0–0.2)

## 2022-11-17 LAB — POCT I-STAT, CHEM 8
BUN: 33 mg/dL — ABNORMAL HIGH (ref 6–20)
Calcium, Ion: 1.22 mmol/L (ref 1.15–1.40)
Chloride: 100 mmol/L (ref 98–111)
Creatinine, Ser: 5.8 mg/dL — ABNORMAL HIGH (ref 0.61–1.24)
Glucose, Bld: 86 mg/dL (ref 70–99)
HCT: 32 % — ABNORMAL LOW (ref 39.0–52.0)
Hemoglobin: 10.9 g/dL — ABNORMAL LOW (ref 13.0–17.0)
Potassium: 4.3 mmol/L (ref 3.5–5.1)
Sodium: 138 mmol/L (ref 135–145)
TCO2: 27 mmol/L (ref 22–32)

## 2022-11-17 LAB — GLUCOSE, CAPILLARY
Glucose-Capillary: 115 mg/dL — ABNORMAL HIGH (ref 70–99)
Glucose-Capillary: 68 mg/dL — ABNORMAL LOW (ref 70–99)
Glucose-Capillary: 112 mg/dL — ABNORMAL HIGH (ref 70–99)

## 2022-11-17 LAB — TYPE AND SCREEN
ABO/RH(D): A POS
Antibody Screen: NEGATIVE

## 2022-11-17 LAB — APTT: aPTT: 98 s — ABNORMAL HIGH (ref 24–36)

## 2022-11-17 SURGERY — AMPUTATION, ABOVE KNEE
Anesthesia: General | Site: Knee | Laterality: Left

## 2022-11-17 MED ORDER — EPHEDRINE SULFATE-NACL 50-0.9 MG/10ML-% IV SOSY
PREFILLED_SYRINGE | INTRAVENOUS | Status: DC | PRN
  Administered 2022-11-17 (×3): 5 mg via INTRAVENOUS
  Administered 2022-11-17: 10 mg via INTRAVENOUS

## 2022-11-17 MED ORDER — MIDAZOLAM HCL 2 MG/2ML IJ SOLN
INTRAMUSCULAR | Status: AC
  Filled 2022-11-17: qty 2

## 2022-11-17 MED ORDER — OXYCODONE HCL 5 MG PO TABS
5.0000 mg | ORAL_TABLET | ORAL | Status: DC | PRN
  Administered 2022-11-17 – 2022-11-19 (×4): 5 mg via ORAL
  Filled 2022-11-17 (×4): qty 1

## 2022-11-17 MED ORDER — AMISULPRIDE (ANTIEMETIC) 5 MG/2ML IV SOLN: 10.0000 mg | Freq: Once | INTRAVENOUS | Status: DC | PRN

## 2022-11-17 MED ORDER — ARGATROBAN 50 MG/50ML IV SOLN
1.0000 ug/kg/min | INTRAVENOUS | Status: DC
  Administered 2022-11-17 – 2022-11-19 (×5): 1 ug/kg/min via INTRAVENOUS
  Filled 2022-11-17 (×7): qty 50

## 2022-11-17 MED ORDER — ONDANSETRON HCL 4 MG/2ML IJ SOLN: 4.0000 mg | Freq: Once | INTRAMUSCULAR | Status: DC | PRN

## 2022-11-17 MED ORDER — SURGIFLO WITH THROMBIN (HEMOSTATIC MATRIX KIT) OPTIME
TOPICAL | Status: DC | PRN
  Administered 2022-11-17: 1 via TOPICAL

## 2022-11-17 MED ORDER — CHLORHEXIDINE GLUCONATE 0.12 % MT SOLN
OROMUCOSAL | Status: AC
  Administered 2022-11-17: 15 mL via OROMUCOSAL
  Filled 2022-11-17: qty 15

## 2022-11-17 MED ORDER — BUPIVACAINE HCL (PF) 0.5 % IJ SOLN
INTRAMUSCULAR | Status: AC
  Filled 2022-11-17: qty 30

## 2022-11-17 MED ORDER — CEFAZOLIN SODIUM-DEXTROSE 2-3 GM-%(50ML) IV SOLR
INTRAVENOUS | Status: DC | PRN
  Administered 2022-11-17: 2 g via INTRAVENOUS

## 2022-11-17 MED ORDER — DEXAMETHASONE SODIUM PHOSPHATE 10 MG/ML IJ SOLN
INTRAMUSCULAR | Status: DC | PRN
  Administered 2022-11-17: 5 mg via INTRAVENOUS

## 2022-11-17 MED ORDER — DEXTROSE 50 % IV SOLN
INTRAVENOUS | Status: AC
  Administered 2022-11-17: 25 mL via INTRAVENOUS
  Filled 2022-11-17: qty 50

## 2022-11-17 MED ORDER — SODIUM CHLORIDE FLUSH 0.9 % IV SOLN
INTRAVENOUS | Status: DC | PRN
  Administered 2022-11-17: 100 mL

## 2022-11-17 MED ORDER — DEXAMETHASONE SODIUM PHOSPHATE 10 MG/ML IJ SOLN
INTRAMUSCULAR | Status: AC
  Filled 2022-11-17: qty 1

## 2022-11-17 MED ORDER — FENTANYL CITRATE (PF) 250 MCG/5ML IJ SOLN
INTRAMUSCULAR | Status: AC
  Filled 2022-11-17: qty 5

## 2022-11-17 MED ORDER — PHENYLEPHRINE HCL-NACL 20-0.9 MG/250ML-% IV SOLN
INTRAVENOUS | Status: DC | PRN
  Administered 2022-11-17: 30 ug/min via INTRAVENOUS

## 2022-11-17 MED ORDER — HYDROMORPHONE HCL 1 MG/ML IJ SOLN
0.5000 mg | INTRAMUSCULAR | Status: DC | PRN
  Administered 2022-11-18 – 2022-11-19 (×3): 0.5 mg via INTRAVENOUS
  Filled 2022-11-17 (×3): qty 0.5

## 2022-11-17 MED ORDER — MIDAZOLAM HCL 2 MG/2ML IJ SOLN
INTRAMUSCULAR | Status: DC | PRN
  Administered 2022-11-17 (×2): 1 mg via INTRAVENOUS

## 2022-11-17 MED ORDER — OXYCODONE HCL 5 MG PO TABS: 5.0000 mg | ORAL_TABLET | Freq: Once | ORAL | Status: DC | PRN

## 2022-11-17 MED ORDER — LIDOCAINE 2% (20 MG/ML) 5 ML SYRINGE
INTRAMUSCULAR | Status: DC | PRN
  Administered 2022-11-17: 40 mg via INTRAVENOUS

## 2022-11-17 MED ORDER — CHLORHEXIDINE GLUCONATE 0.12 % MT SOLN: 15.0000 mL | Freq: Once | OROMUCOSAL | Status: AC

## 2022-11-17 MED ORDER — ALBUMIN HUMAN 5 % IV SOLN: INTRAVENOUS | Status: DC | PRN

## 2022-11-17 MED ORDER — CEFAZOLIN SODIUM-DEXTROSE 2-4 GM/100ML-% IV SOLN
INTRAVENOUS | Status: AC
  Filled 2022-11-17: qty 100

## 2022-11-17 MED ORDER — ORAL CARE MOUTH RINSE: 15.0000 mL | Freq: Once | OROMUCOSAL | Status: AC

## 2022-11-17 MED ORDER — PHENYLEPHRINE 80 MCG/ML (10ML) SYRINGE FOR IV PUSH (FOR BLOOD PRESSURE SUPPORT)
PREFILLED_SYRINGE | INTRAVENOUS | Status: DC | PRN
  Administered 2022-11-17: 80 ug via INTRAVENOUS

## 2022-11-17 MED ORDER — PROMETHAZINE HCL 25 MG/ML IJ SOLN: 6.2500 mg | INTRAMUSCULAR | Status: DC | PRN

## 2022-11-17 MED ORDER — PHENOL 1.4 % MT LIQD: 1.0000 | OROMUCOSAL | Status: DC | PRN

## 2022-11-17 MED ORDER — PHENYLEPHRINE 80 MCG/ML (10ML) SYRINGE FOR IV PUSH (FOR BLOOD PRESSURE SUPPORT): PREFILLED_SYRINGE | INTRAVENOUS | Status: DC | PRN

## 2022-11-17 MED ORDER — SODIUM CHLORIDE 0.9 % IV SOLN: INTRAVENOUS | Status: DC

## 2022-11-17 MED ORDER — OXYCODONE HCL 5 MG/5ML PO SOLN: 5.0000 mg | Freq: Once | ORAL | Status: DC | PRN

## 2022-11-17 MED ORDER — ONDANSETRON HCL 4 MG/2ML IJ SOLN
INTRAMUSCULAR | Status: DC | PRN
  Administered 2022-11-17: 4 mg via INTRAVENOUS

## 2022-11-17 MED ORDER — DOCUSATE SODIUM 100 MG PO CAPS
100.0000 mg | ORAL_CAPSULE | Freq: Every day | ORAL | Status: DC
  Administered 2022-11-18 – 2022-11-29 (×10): 100 mg via ORAL
  Filled 2022-11-17 (×12): qty 1

## 2022-11-17 MED ORDER — PHENYLEPHRINE HCL-NACL 20-0.9 MG/250ML-% IV SOLN
INTRAVENOUS | Status: AC
  Filled 2022-11-17: qty 500

## 2022-11-17 MED ORDER — BACITRACIN ZINC 500 UNIT/GM EX OINT
TOPICAL_OINTMENT | CUTANEOUS | Status: AC
  Filled 2022-11-17: qty 28.35

## 2022-11-17 MED ORDER — CEFAZOLIN SODIUM-DEXTROSE 2-4 GM/100ML-% IV SOLN
2.0000 g | Freq: Once | INTRAVENOUS | Status: AC
  Administered 2022-11-17: 2 g via INTRAVENOUS

## 2022-11-17 MED ORDER — HYDROMORPHONE HCL 1 MG/ML IJ SOLN
0.2500 mg | INTRAMUSCULAR | Status: DC | PRN
  Administered 2022-11-17 (×4): 0.25 mg via INTRAVENOUS

## 2022-11-17 MED ORDER — BACITRACIN ZINC 500 UNIT/GM EX OINT
TOPICAL_OINTMENT | CUTANEOUS | Status: DC | PRN
  Administered 2022-11-17: 1 via TOPICAL

## 2022-11-17 MED ORDER — PROPOFOL 10 MG/ML IV BOLUS
INTRAVENOUS | Status: DC | PRN
Start: 2022-11-17 — End: 2022-11-17
  Administered 2022-11-17: 200 mg via INTRAVENOUS

## 2022-11-17 MED ORDER — HYDROMORPHONE HCL 1 MG/ML IJ SOLN: 0.2500 mg | INTRAMUSCULAR | Status: DC | PRN

## 2022-11-17 MED ORDER — BUPIVACAINE LIPOSOME 1.3 % IJ SUSP
INTRAMUSCULAR | Status: AC
  Filled 2022-11-17: qty 20

## 2022-11-17 MED ORDER — 0.9 % SODIUM CHLORIDE (POUR BTL) OPTIME
TOPICAL | Status: DC | PRN
  Administered 2022-11-17: 1000 mL

## 2022-11-17 MED ORDER — DEXTROSE 50 % IV SOLN: 25.0000 mL | Freq: Once | INTRAVENOUS | Status: AC

## 2022-11-17 MED ORDER — HYDROMORPHONE HCL 1 MG/ML IJ SOLN
INTRAMUSCULAR | Status: AC
  Filled 2022-11-17: qty 1

## 2022-11-17 MED ORDER — FENTANYL CITRATE (PF) 250 MCG/5ML IJ SOLN
INTRAMUSCULAR | Status: DC | PRN
  Administered 2022-11-17 (×2): 25 ug via INTRAVENOUS

## 2022-11-17 SURGICAL SUPPLY — 64 items
AGENT HMST MTR 8 SURGIFLO (HEMOSTASIS) ×1
BAG COUNTER SPONGE SURGICOUNT (BAG) ×1 IMPLANT
BAG SPNG CNTER NS LX DISP (BAG) ×1
BLADE SAGITTAL (BLADE) ×1
BLADE SAW GIGLI 510 (BLADE) ×1 IMPLANT
BLADE SAW THK.89X75X18XSGTL (BLADE) IMPLANT
BNDG CMPR 5X6 CHSV STRCH STRL (GAUZE/BANDAGES/DRESSINGS) ×1
BNDG CMPR STD VLCR NS LF 5.8X4 (GAUZE/BANDAGES/DRESSINGS) ×2
BNDG COHESIVE 6X5 TAN ST LF (GAUZE/BANDAGES/DRESSINGS) ×1 IMPLANT
BNDG ELASTIC 4X5.8 VLCR NS LF (GAUZE/BANDAGES/DRESSINGS) IMPLANT
BNDG ELASTIC 4X5.8 VLCR STR LF (GAUZE/BANDAGES/DRESSINGS) ×1 IMPLANT
BNDG ELASTIC 6X5.8 VLCR STR LF (GAUZE/BANDAGES/DRESSINGS) ×1 IMPLANT
BNDG GAUZE DERMACEA FLUFF 4 (GAUZE/BANDAGES/DRESSINGS) ×2 IMPLANT
BNDG GZE DERMACEA 4 6PLY (GAUZE/BANDAGES/DRESSINGS) ×2
BUR DISC 0.8X25 (BURR) IMPLANT
CANISTER SUCT 3000ML PPV (MISCELLANEOUS) ×1 IMPLANT
CLIP TI MEDIUM 6 (CLIP) ×1 IMPLANT
CNTNR URN SCR LID CUP LEK RST (MISCELLANEOUS) ×1 IMPLANT
CONT SPEC 4OZ STRL OR WHT (MISCELLANEOUS) ×1
COVER SURGICAL LIGHT HANDLE (MISCELLANEOUS) ×1 IMPLANT
DRAIN CHANNEL 19F RND (DRAIN) IMPLANT
DRAIN RELI 100 BL SUC LF ST (DRAIN)
DRAPE DERMATAC (DRAPES) IMPLANT
DRAPE HALF SHEET 40X57 (DRAPES) ×1 IMPLANT
DRAPE INCISE IOBAN 66X45 STRL (DRAPES) IMPLANT
DRAPE ORTHO SPLIT 77X108 STRL (DRAPES) ×2
DRAPE SURG ORHT 6 SPLT 77X108 (DRAPES) ×2 IMPLANT
DRSG ADAPTIC 3X8 NADH LF (GAUZE/BANDAGES/DRESSINGS) ×1 IMPLANT
ELECT CAUTERY BLADE 6.4 (BLADE) ×1 IMPLANT
ELECT REM PT RETURN 9FT ADLT (ELECTROSURGICAL) ×1
ELECTRODE REM PT RTRN 9FT ADLT (ELECTROSURGICAL) ×1 IMPLANT
EVACUATOR SILICONE 100CC (DRAIN) IMPLANT
GAUZE 4X4 16PLY ~~LOC~~+RFID DBL (SPONGE) ×1 IMPLANT
GAUZE SPONGE 4X4 12PLY STRL (GAUZE/BANDAGES/DRESSINGS) ×2 IMPLANT
GLOVE SURG SS PI 7.5 STRL IVOR (GLOVE) ×3 IMPLANT
GOWN STRL REUS W/ TWL LRG LVL3 (GOWN DISPOSABLE) ×2 IMPLANT
GOWN STRL REUS W/ TWL XL LVL3 (GOWN DISPOSABLE) ×1 IMPLANT
GOWN STRL REUS W/TWL LRG LVL3 (GOWN DISPOSABLE) ×2
GOWN STRL REUS W/TWL XL LVL3 (GOWN DISPOSABLE) ×1
KIT BASIN OR (CUSTOM PROCEDURE TRAY) ×1 IMPLANT
KIT TURNOVER KIT B (KITS) ×1 IMPLANT
NDL 18GX1X1/2 (RX/OR ONLY) (NEEDLE) IMPLANT
NDL HYPO 25GX1X1/2 BEV (NEEDLE) ×1 IMPLANT
NEEDLE 18GX1X1/2 (RX/OR ONLY) (NEEDLE) ×1 IMPLANT
NEEDLE HYPO 25GX1X1/2 BEV (NEEDLE) ×1 IMPLANT
NS IRRIG 1000ML POUR BTL (IV SOLUTION) ×1 IMPLANT
PACK GENERAL/GYN (CUSTOM PROCEDURE TRAY) ×1 IMPLANT
PAD ARMBOARD 7.5X6 YLW CONV (MISCELLANEOUS) ×2 IMPLANT
RASP HELIOCORDIAL MED (MISCELLANEOUS) IMPLANT
SPONGE SURGIFLO 8M (HEMOSTASIS) IMPLANT
STAPLER VISISTAT 35W (STAPLE) ×1 IMPLANT
STOCKINETTE IMPERVIOUS LG (DRAPES) ×1 IMPLANT
SUT ETHILON 3 0 PS 1 (SUTURE) IMPLANT
SUT SILK 0 TIES 10X30 (SUTURE) ×1 IMPLANT
SUT SILK 2 0 (SUTURE)
SUT SILK 2-0 18XBRD TIE 12 (SUTURE) IMPLANT
SUT SILK 3 0 (SUTURE)
SUT SILK 3-0 18XBRD TIE 12 (SUTURE) IMPLANT
SUT VIC AB 2-0 CT1 18 (SUTURE) ×2 IMPLANT
SYR 30ML LL (SYRINGE) IMPLANT
SYR CONTROL 10ML LL (SYRINGE) ×1 IMPLANT
TOWEL GREEN STERILE (TOWEL DISPOSABLE) ×1 IMPLANT
UNDERPAD 30X36 HEAVY ABSORB (UNDERPADS AND DIAPERS) ×1 IMPLANT
WATER STERILE IRR 1000ML POUR (IV SOLUTION) ×1 IMPLANT

## 2022-11-17 NOTE — Progress Notes (Signed)
ANTICOAGULATION CONSULT NOTE - Follow Up Consult  Pharmacy Consult for warfarin>>argatroban Indication: atrial fibrillation  Allergies  Allergen Reactions   Ciprofloxacin Other (See Comments)    Aortic dissection   Heparin Other (See Comments)    UNSPECIFIED REACTION :  On Coumadin since 2004   HIT panel negative 01/19/17   Doxercalciferol Other (See Comments)   Quinolones Other (See Comments)    unknown    Patient Measurements: Height: 6\' 3"  (190.5 cm) Weight: 59.4 kg (130 lb 15.3 oz) IBW/kg (Calculated) : 84.5  Vital Signs: Temp: 97.9 F (36.6 C) (08/16 0833) Temp Source: Oral (08/16 0731) BP: 120/74 (08/16 1000) Pulse Rate: 57 (08/16 1000)  Labs: Recent Labs    11/16/22 0722 11/17/22 0039 11/17/22 0921 11/17/22 0937 11/18/22 0059 11/18/22 0844  HGB 9.7* 9.6* 10.9*  --  9.2*  --   HCT 33.2* 33.0* 32.0*  --  31.2*  --   PLT 136* 146*  --   --  189  --   APTT 94* 98*  --   --  85* 87*  LABPROT  --   --   --   --  32.8*  --   INR  --   --   --   --  3.2*  --   CREATININE 4.38*  --  5.80* 5.59* 6.32*  --     Estimated Creatinine Clearance: 11.5 mL/min (A) (by C-G formula based on SCr of 6.32 mg/dL (H)).   Medications:  Scheduled:   amiodarone  200 mg Oral Daily   Chlorhexidine Gluconate Cloth  6 each Topical Q0600   darbepoetin (ARANESP) injection - DIALYSIS  40 mcg Subcutaneous Q Sat-1800   docusate sodium  100 mg Oral Daily   feeding supplement  237 mL Oral BID BM   hydrocerin   Topical Daily   lidocaine  3 patch Transdermal Q24H   midodrine  15 mg Oral TID WC   multivitamin  1 tablet Oral QHS   polyethylene glycol  17 g Oral BID   senna-docusate  1 tablet Oral BID   sevelamer carbonate  2,400 mg Oral TID WC    Assessment: 52 YOM with pleural effusion and PAD. S/p chest tube placement and removal on 8/9. Patient was on warfarin PTA for history of afib which was resumed 8/9. Warfarin then held for AKA Of note, patient was on amiodarone PTA. Patient  is not compliant with anticoagulation follow-ups, but last home regimen was warfarin 2 mg daily. As outpatient has last couple INRs have been 1.5 or less. Now s/p AKA 8/15. Pharmacy consulted to dose argatroban and warfarin.   aPTT 87 is therapeutic on 1 mcg/kg/min. No sign of bleeding. Per VVS, plan to restart oral anticoagulation in the next days if wound remains stable. OK to switch to apixaban when restarting oral.   Goal of Therapy:  aPTT 50-90 INR 2-3 Monitor platelets by anticoagulation protocol: Yes   Plan:  Continue Argatroban 76mcg/kg/min Daily aPTT, CBC and INR F/u switch from warfarin to apixaban    Alphia Moh, PharmD, BCPS, BCCP Clinical Pharmacist  Please check AMION for all [facility] Pharmacy phone numbers After 10:00 PM, call Main Pharmacy [phone]

## 2022-11-17 NOTE — Anesthesia Postprocedure Evaluation (Signed)
Anesthesia Post Note  Patient: Corey [name]  Procedure(s) Performed: AMPUTATION ABOVE KNEE (Left: Knee)     Patient location during evaluation: PACU Anesthesia Type: General Level of consciousness: awake and alert Pain management: pain level controlled Vital Signs Assessment: post-procedure vital signs reviewed and stable Respiratory status: spontaneous breathing, nonlabored ventilation and respiratory function stable Cardiovascular status: blood pressure returned to baseline and stable Postop Assessment: no apparent nausea or vomiting Anesthetic complications: no   No notable events documented.  Last Vitals:  Vitals:   11/17/22 1145 11/17/22 1200  BP: 107/65 106/63  Pulse: (!) 58 (!) 58  Resp: 19 20  Temp:  (!) 36.4 C  SpO2: 96% 94%    Last Pain:  Vitals:   11/17/22 1130  TempSrc:   PainSc: 4                  [name]

## 2022-11-17 NOTE — Op Note (Signed)
    Patient name: Corey [name] MRN: [id number] DOB: 1970/05/08 Sex: male  11/17/2022 Pre-operative Diagnosis: Ischemic left leg Post-operative diagnosis:  Same Surgeon:  [name] Assistants:  Doreatha Massed,  PA Procedure:   Left above-knee amputation Anesthesia:  General Blood Loss: Minimal Specimens: Left leg  Findings: Healthy appearing tissue at the amputation site  Indications: This is a 52 year old male with multiple medical problems who has a nonsalvageable left foot.  He has a chronic contracture of the left knee.  He comes in today for an above-knee amputation.  Procedure:  The patient was identified in the holding area and taken to [facility] OR ROOM 16  The patient was then placed supine on the table. general anesthesia was administered.  The patient was prepped and draped in the usual sterile fashion.  A time out was called and antibiotics were administered.  A PA was necessary to expedite the procedure and assist with technical details.  She help with exposure by providing suction and retraction.  She help with wound closure and dressing placement.  A fishmouth incision was made just proximal to the patella.  Cautery was used to divide the subcutaneous tissue and muscle.  The IT band was heavily calcified.  I got circumferential control of the femur and transected this with a oscillating saw.  The anterior surface of the bone was beveled.  I then divided the remaining tissue with cautery.  Clamps were placed on the neurovascular bundle which was divided.  The leg was removed as a specimen.  I then dissected out the nerve and ligated it proximal to the cut edge of the femur after infiltrating it with Exparel.  Similarly, I ligated the artery and vein with 2-0 silk ties proximal to the cut edge of the femur.  A rasp was used to smooth the bone surface.  The wound was then irrigated.  Hemostasis was achieved.  The fascia was reapproximated with interrupted 2-0 Vicryl.  The skin was closed  with staples followed by placement of sterile dressings.  There were no immediate complications.   Disposition: To PACU stable.   Juleen China, M.D., [facility] [place] Office: [phone] Pager:  [phone]

## 2022-11-17 NOTE — Anesthesia Procedure Notes (Signed)
Procedure Name: LMA Insertion Date/Time: 11/17/2022 9:35 AM  Performed by: Marena Chancy, CRNAPre-anesthesia Checklist: Patient identified, Emergency Drugs available, Suction available and Patient being monitored Patient Re-evaluated:Patient Re-evaluated prior to induction Oxygen Delivery Method: Circle System Utilized Preoxygenation: Pre-oxygenation with 100% oxygen Induction Type: IV induction Ventilation: Mask ventilation without difficulty LMA: LMA inserted LMA Size: 4.0 Number of attempts: 1 Airway Equipment and Method: Bite block Placement Confirmation: positive ETCO2 Tube secured with: Tape Dental Injury: Teeth and Oropharynx as per pre-operative assessment

## 2022-11-17 NOTE — Interval H&P Note (Signed)
History and Physical Interval Note:  11/17/2022 7:40 AM  Corey [name]  has presented today for surgery, with the diagnosis of Left necrotic heel.  The various methods of treatment have been discussed with the patient and family. After consideration of risks, benefits and other options for treatment, the patient has consented to  Procedure(s): AMPUTATION ABOVE KNEE (Left) as a surgical intervention.  The patient's history has been reviewed, patient examined, no change in status, stable for surgery.  I have reviewed the patient's chart and labs.  Questions were answered to the patient's satisfaction.     [name]

## 2022-11-17 NOTE — Progress Notes (Signed)
[facility] NEPHROLOGY PROGRESS NOTE  Assessment/ Plan: OP HD: TTS [facility] 4h  2/2 bath  66kg   TDC   no heparin (allergy)  lock TDC w/ citrate - No ESA or VDRA - Binder: Renvela pwdr 4.8g TID - Sensipar 180mg  every day at home  # Acute hypoxic respiratory failure: In setting of missed HD, pna w/ effusion. CXR with pleural effusion s/p chest tube 7/31 now removed. Resolved.  # PNA/ L parapneumonic pleural effusion: s/p thoracentesis with cultures. Got IV abx course, now on augmentin thru 8/14.   # ESRD: maintain TTS dialysis schedule. Refuse HD today therefore postponing to tomorrow.   #Heparin allergy - use citrate for cath lock.  #Hypotension/ volume: BP low, LE edema resolved.  Is on midodrine. Will need lower EDW after discharge.  #Anemia of ESRD:  Hgb 9-10s - s/p Aranesp on 7/27.  CTM.  # Secondary HPTH: Ca/Phos ok - continue home binders.  Held cinacalcet due to hypoCa but corrects near normal when accounting for albumin.   #A-fib: On amiodarone + warfarin, has IVC filter  #Hx type B aortic dissection s/p repair (2004)   #Left leg wound - s/p AKA today, per ortho.   D/w multiple family members at bedside.    Subjective:  Seen and examined. He came from left AKA surgery. Reports feeling tired and doesn't want HD today. Agreed for dialysis tomorrow. Denies N/V/CP or SOB. Family members with him at bedside. D/w dialysis nurse as well.  Objective Vital signs in last 24 hours: Vitals:   11/17/22 1130 11/17/22 1145 11/17/22 1200 11/17/22 1327  BP: 107/62 107/65 106/63   Pulse: 60 (!) 58 (!) 58 65  Resp: 18 19 20 16   Temp:   (!) 97.5 F (36.4 C)   TempSrc:    Oral  SpO2: 94% 96% 94% 99%  Weight:      Height:       Weight change:   Intake/Output Summary (Last 24 hours) at 11/17/2022 1431 Last data filed at 11/17/2022 1048 Gross per 24 hour  Intake 817.81 ml  Output 100 ml  Net 717.81 ml       Labs: RENAL PANEL Recent Labs  Lab  11/12/22 0049 11/14/22 0038 11/15/22 0108 11/16/22 0722 11/17/22 0921 11/17/22 0937  NA 136 133* 136 135 138 138  K 4.1 3.9 4.0 4.0 4.3 4.3  CL 96* 96* 98 97* 100 101  CO2 23 27 25 25   --  26  GLUCOSE 81 101* 87 102* 86 86  BUN 41* 32* 43* 26* 33* 33*  CREATININE 6.26* 5.28* 6.66* 4.38* 5.80* 5.59*  CALCIUM 8.5* 8.3* 8.7* 8.8*  --  9.0  MG  --   --   --  2.0  --   --   PHOS  --  3.6  --  3.4  --   --   ALBUMIN  --  2.4*  --  2.5*  --   --     Liver Function Tests: Recent Labs  Lab 11/14/22 0038 11/16/22 0722  ALBUMIN 2.4* 2.5*   No results for input(s): "LIPASE", "AMYLASE" in the last 168 hours. No results for input(s): "AMMONIA" in the last 168 hours. CBC: Recent Labs    11/14/22 0038 11/15/22 0108 11/16/22 0722 11/17/22 0039 11/17/22 0921  HGB 9.7* 9.7* 9.7* 9.6* 10.9*  MCV 98.8 95.7 95.1 94.8  --     Cardiac Enzymes: No results for input(s): "CKTOTAL", "CKMB", "CKMBINDEX", "TROPONINI" in the last 168 hours.  CBG: Recent Labs  Lab 11/17/22 1101 11/17/22 1126 11/17/22 1326  GLUCAP 68* 115* 112*    Iron Studies: No results for input(s): "IRON", "TIBC", "TRANSFERRIN", "FERRITIN" in the last 72 hours. Studies/Results: ECHOCARDIOGRAM COMPLETE  Result Date: 11/16/2022    ECHOCARDIOGRAM REPORT   Patient Name:   Corey [name] Date of Exam: 11/16/2022 Medical Rec #:  [id number]    Height:       75.0 in Accession #:    [id number]   Weight:       129.9 lb Date of Birth:  27-Apr-1970    BSA:          1.826 m Patient Age:    52 years     BP:           84/53 mmHg Patient Gender: M            HR:           62 bpm. Exam Location:  Inpatient Procedure: 2D Echo, Cardiac Doppler and Color Doppler Indications:    Preoperative evaluation  History:        Patient has prior history of Echocardiogram examinations, most                 recent 05/20/2018. CHF, Arrythmias:Atrial Fibrillation,                 Signs/Symptoms:Chest Pain; Risk Factors:Hypertension. ESRD.  Sonographer:    Lucendia Herrlich Referring Phys: [id number] TAYE T GONFA IMPRESSIONS  1. Left ventricular ejection fraction, by estimation, is 50 to 55%. The left ventricle has low normal function. The left ventricle has no regional wall motion abnormalities. Left ventricular diastolic parameters are indeterminate. There is the interventricular septum is flattened in systole and diastole, consistent with right ventricular pressure and volume overload.  2. Right ventricular systolic function is severely reduced. The right ventricular size is severely enlarged. Tricuspid regurgitation signal is inadequate for assessing PA pressure.  3. Left atrial size was mildly dilated.  4. Right atrial size was severely dilated.  5. The mitral valve is normal in structure. No evidence of mitral valve regurgitation. No evidence of mitral stenosis.  6. The aortic valve is tricuspid. Aortic valve regurgitation is not visualized. No aortic stenosis is present.  7. The inferior vena cava is normal in size with <50% respiratory variability, suggesting right atrial pressure of 8 mmHg. FINDINGS  Left Ventricle: Left ventricular ejection fraction, by estimation, is 50 to 55%. The left ventricle has low normal function. The left ventricle has no regional wall motion abnormalities. The left ventricular internal cavity size was normal in size. There is no left ventricular hypertrophy. The interventricular septum is flattened in systole and diastole, consistent with right ventricular pressure and volume overload. Left ventricular diastolic parameters are indeterminate. Right Ventricle: The right ventricular size is severely enlarged. No increase in right ventricular wall thickness. Right ventricular systolic function is severely reduced. Tricuspid regurgitation signal is inadequate for assessing PA pressure. The tricuspid regurgitant velocity is 2.57 m/s, and with an assumed right atrial pressure of 8 mmHg, the estimated right ventricular systolic pressure is 34.4 mmHg.  Left Atrium: Left atrial size was mildly dilated. Right Atrium: Right atrial size was severely dilated. Pericardium: Trivial pericardial effusion is present. Mitral Valve: The mitral valve is normal in structure. No evidence of mitral valve regurgitation. No evidence of mitral valve stenosis. Tricuspid Valve: The tricuspid valve is normal in structure. Tricuspid valve regurgitation is mild . No evidence of tricuspid stenosis.  Aortic Valve: The aortic valve is tricuspid. Aortic valve regurgitation is not visualized. No aortic stenosis is present. Aortic valve peak gradient measures 3.5 mmHg. Pulmonic Valve: The pulmonic valve was normal in structure. Pulmonic valve regurgitation is trivial. No evidence of pulmonic stenosis. Aorta: The aortic root is normal in size and structure. Venous: The inferior vena cava is normal in size with less than 50% respiratory variability, suggesting right atrial pressure of 8 mmHg. IAS/Shunts: There is left bowing of the interatrial septum, suggestive of elevated right atrial pressure. No atrial level shunt detected by color flow Doppler.  LEFT VENTRICLE PLAX 2D LVIDd:         3.80 cm   Diastology LVIDs:         2.30 cm   LV e' medial:    6.01 cm/s LV PW:         1.10 cm   LV E/e' medial:  8.7 LV IVS:        1.10 cm   LV e' lateral:   11.30 cm/s LVOT diam:     2.20 cm   LV E/e' lateral: 4.6 LV SV:         47 LV SV Index:   26 LVOT Area:     3.80 cm  RIGHT VENTRICLE             IVC RV S prime:     10.30 cm/s  IVC diam: 2.00 cm TAPSE (M-mode): 1.4 cm LEFT ATRIUM             Index        RIGHT ATRIUM           Index LA diam:        4.40 cm 2.41 cm/m   RA Area:     23.80 cm LA Vol (A2C):   42.9 ml 23.49 ml/m  RA Volume:   83.30 ml  45.61 ml/m LA Vol (A4C):   62.3 ml 34.11 ml/m LA Biplane Vol: 56.2 ml 30.77 ml/m  AORTIC VALVE                 PULMONIC VALVE AV Area (Vmax): 3.02 cm     PR End Diast Vel: 14.14 msec AV Vmax:        92.90 cm/s AV Peak Grad:   3.5 mmHg LVOT Vmax:       73.90 cm/s LVOT Vmean:     44.100 cm/s LVOT VTI:       0.123 m  AORTA Ao Root diam: 3.50 cm Ao Asc diam:  3.10 cm MITRAL VALVE               TRICUSPID VALVE MV Area (PHT): 2.80 cm    TR Peak grad:   26.4 mmHg MV Decel Time: 271 msec    TR Vmax:        257.00 cm/s MV E velocity: 52.20 cm/s MV A velocity: 25.70 cm/s  SHUNTS MV E/A ratio:  2.03        Systemic VTI:  0.12 m                            Systemic Diam: 2.20 cm Olga Millers MD Electronically signed by Olga Millers MD Signature Date/Time: 11/16/2022/4:07:01 PM    Final     Medications: Infusions:  anticoagulant sodium citrate 100 mL/hr at 11/17/22 0745   argatroban Stopped (11/17/22 0842)    Scheduled Medications:  amiodarone  200 mg  Oral Daily   Chlorhexidine Gluconate Cloth  6 each Topical Q0600   darbepoetin (ARANESP) injection - DIALYSIS  40 mcg Subcutaneous Q Sat-1800   [START ON 11/18/2022] docusate sodium  100 mg Oral Daily   feeding supplement  237 mL Oral BID BM   hydrocerin   Topical Daily   HYDROmorphone       lidocaine  3 patch Transdermal Q24H   midodrine  15 mg Oral TID WC   multivitamin  1 tablet Oral QHS   polyethylene glycol  17 g Oral BID   senna-docusate  1 tablet Oral BID   sevelamer carbonate  4.8 g Oral TID WC    have reviewed scheduled and prn medications.  Physical Exam: General:NAD, comfortable Heart:RRR, s1s2 nl Lungs:clear b/l, no crackle Abdomen:soft, Non-tender, non-distended Extremities:No edema, left AKA Dialysis Access: TDC    Prasad  11/17/2022,2:31 PM  LOS: 21 days

## 2022-11-17 NOTE — Plan of Care (Signed)
  Problem: Education: Goal: Knowledge of General Education information will improve Description: Including pain rating scale, medication(s)/side effects and non-pharmacologic comfort measures Outcome: Progressing   Problem: Clinical Measurements: Goal: Will remain free from infection Outcome: Progressing Goal: Diagnostic test results will improve Outcome: Progressing   

## 2022-11-17 NOTE — Progress Notes (Signed)
PT Cancellation Note  Patient Details Name: Corey [name] MRN: [id number] DOB: Aug 18, 1970   Cancelled Treatment:    Reason Eval/Treat Not Completed: Patient at procedure or test/unavailable; patient down for AKA.  Will attempt again another day.   [name] 11/17/2022, 9:09 AM [name], PT Acute Rehabilitation Services Office:[phone] 11/17/2022

## 2022-11-17 NOTE — Progress Notes (Signed)
  Progress Note    11/17/2022 7:11 AM 15 Days Post-Op  Subjective:  pt resting in his room.  He is scared about surgery.    afebrile  Vitals:   11/17/22 0405 11/17/22 0500  BP: 99/61 104/66  Pulse: (!) 57 (!) 54  Resp: 18 16  Temp: 98.2 F (36.8 C)   SpO2: 99% 99%    Physical Exam: General:  no distress Lungs:  non labored   CBC    Component Value Date/Time   WBC 9.3 11/17/2022 0039   RBC 3.48 (L) 11/17/2022 0039   HGB 9.6 (L) 11/17/2022 0039   HGB 11.4 (L) 08/31/2022 1137   HCT 33.0 (L) 11/17/2022 0039   HCT 34.7 (L) 08/31/2022 1137   PLT 146 (L) 11/17/2022 0039   PLT 135 (L) 08/31/2022 1137   MCV 94.8 11/17/2022 0039   MCV 93 08/31/2022 1137   MCH 27.6 11/17/2022 0039   MCHC 29.1 (L) 11/17/2022 0039   RDW 15.6 (H) 11/17/2022 0039   RDW 13.4 08/31/2022 1137   LYMPHSABS 0.8 11/07/2022 0048   LYMPHSABS 1.0 02/13/2017 1425   MONOABS 1.1 (H) 11/07/2022 0048   EOSABS 0.1 11/07/2022 0048   EOSABS 0.1 02/13/2017 1425   BASOSABS 0.0 11/07/2022 0048   BASOSABS 0.0 02/13/2017 1425    BMET    Component Value Date/Time   NA 135 11/16/2022 0722   NA 143 08/31/2022 1137   K 4.0 11/16/2022 0722   CL 97 (L) 11/16/2022 0722   CO2 25 11/16/2022 0722   GLUCOSE 102 (H) 11/16/2022 0722   BUN 26 (H) 11/16/2022 0722   BUN 57 (H) 08/31/2022 1137   CREATININE 4.38 (H) 11/16/2022 0722   CALCIUM 8.8 (L) 11/16/2022 0722   GFRNONAA 15 (L) 11/16/2022 0722   GFRAA 5 (L) 09/04/2019 1114    INR    Component Value Date/Time   INR 1.4 (H) 11/15/2022 0108     Intake/Output Summary (Last 24 hours) at 11/17/2022 0711 Last data filed at 11/17/2022 0100 Gross per 24 hour  Intake 57.54 ml  Output --  Net 57.54 ml      Assessment/Plan:  52 y.o. male with hx of aortic dissection with thoracic stent graft in 2004 in [place]. He has combined chronic venous insufficiency and PAD with chronic thickening of his skin with hyperkeratosis and new left heel ulceration   Hospital Day  21   -pt questions answered this morning.  He has additional questions for anesthesia.  Discussed with him that his surgery has been moved up and to let his family know.   -discussed AKA vs BKA due to his skin and knee contracture, it would be low likelihood of healing.   -DVT prophylaxis:  he is on argatroban   Doreatha Massed, PA-C [facility] [phone] 11/17/2022 7:11 AM

## 2022-11-17 NOTE — Transfer of Care (Signed)
Immediate Anesthesia Transfer of Care Note  Patient: Corey [name]  Procedure(s) Performed: AMPUTATION ABOVE KNEE (Left: Knee)  Patient Location: PACU  Anesthesia Type:General  Level of Consciousness: drowsy  Airway & Oxygen Therapy: Patient Spontanous Breathing and Patient connected to face mask oxygen  Post-op Assessment: Report given to RN and Post -op Vital signs reviewed and stable  Post vital signs: Reviewed and stable  Last Vitals:  Vitals Value Taken Time  BP 113/72 11/17/22 1054  Temp    Pulse 62 11/17/22 1059  Resp 15 11/17/22 1059  SpO2 100 % 11/17/22 1059  Vitals shown include unfiled device data.  Last Pain:  Vitals:   11/17/22 0758  TempSrc: Oral  PainSc:       Patients Stated Pain Goal: 0 (11/08/22 1147)  Complications: No notable events documented.

## 2022-11-17 NOTE — Progress Notes (Signed)
PROGRESS NOTE  Corey [name] ONG:[id number] DOB: 07/26/1970   PCP: [name], NP  Patient is from: Home  DOA: 10/27/2022 LOS: 21  Chief complaints Chief Complaint  Patient presents with   Flank Pain   Shoulder Pain     Brief Narrative / Interim history: 52 year old M with PMH of ESRD on HD TTS, calciphylaxis, paroxysmal A-fib on warfarin, type B aortic dissection, PAD, chronic LE edema, severe malnutrition, blindness and nonambulatory status presenting to the hospital with SOB and several days of left shoulder, chest and flank pain.  As a result of pain, he missed 2 HD sessions.  Per EMS, he was saturating at 85% on RA.  CT chest/abdomen/pelvis showed moderate to large loculated left-sided pleural effusion with lower lobe consolidation.  He was admitted for acute respiratory failure with hypoxia due to left lobar pneumonia with parapneumonic effusion.  PCCM consulted, and had a chest tube placed on 7/31 after INR reversal with vitamin K.  He required multiple rounds of lytics.  Pleural fluid cultures NGTD.  Started on ceftriaxone and Zithromax on 8/1.  Transitioned to p.o. Augmentin on 8/9 that he will continue until 8/15.  Chest tube removed on 8/9.  Found to have left heel necrosis on 8/12.  Vascular surgery consulted and patient underwent left AKA on 8/15.  Therapy recommended CIR.  CIR following.   Subjective: Seen and examined earlier this afternoon after he returned from surgery.  Reports pain at surgical site.  No other complaints.  Objective: Vitals:   11/17/22 1130 11/17/22 1145 11/17/22 1200 11/17/22 1327  BP: 107/62 107/65 106/63   Pulse: 60 (!) 58 (!) 58   Resp: 18 19 20 16   Temp:   (!) 97.5 F (36.4 C) (!) 97.5 F (36.4 C)  TempSrc:    Oral  SpO2: 94% 96% 94%   Weight:      Height:        Examination:  GENERAL: Appears frail.  No apparent distress.  Nontoxic. HEENT: MMM.  Hearing grossly intact.  Legally blind. NECK: Supple.  No apparent JVD.  RESP:   No IWOB.  Fair aeration bilaterally. CVS:  RRR. Heart sounds normal.  ABD/GI/GU: BS+. Abd soft, NTND.  MSK/EXT: Ace wrap over left AKA DCI. SKIN: Tree bark skin is in BLE as seen in picture below. NEURO: Awake, alert and oriented appropriately.  No apparent focal neuro deficit. PSYCH: Calm. Normal affect.    Procedures:  7/31-8/9-left chest tube 8/15-left AKA  Microbiology summarized: 7/26-MRSA PCR screen nonreactive 7/31-pleural fluid culture NGTD 7/31-pleural fluid AFB culture pending.  Smear negative. 7/31-pleural fluid fungal culture pending.  Assessment and plan: Principal Problem:   Acute respiratory failure with hypoxia (HCC) Active Problems:   ESRD on dialysis [facility])   Calciphylaxis   Essential hypertension   Chronic diastolic CHF (congestive heart failure) (HCC)   Atrial fibrillation, chronic (HCC)   End-stage renal disease on hemodialysis (HCC)   AAA (abdominal aortic aneurysm) without rupture (HCC)   Hyperkalemia   PVD (peripheral vascular disease) (HCC)   Constipation   Lobar pneumonia (HCC)   Parapneumonic effusion   Gangrene (HCC)   Protein-calorie malnutrition, severe  Acute respiratory failure with hypoxia due to left lower lobe pneumonia with parapneumonic effusion and fluid overload: TTE with LVEF of 50 to 55% and severely enlarged RV and RVSP of 34.4 mmHg.  Seems to have resolved.  Intermittently placed on 2 L by [place] although saturating in upper 90s. -Treated with dialysis, left chest tube placement  and antibiotics. -Chest tube removed 8/9. -CTX and Zithromax 8/1>>> p.o. Augmentin on 8/9-8/14. -Fluid management by dialysis -Wean oxygen, incentive spirometry, OOB, PT/OT   ESRD on HD TTS: Reportedly missed 2 HD sessions due to pain. -Dialysis per nephrology  Acute on chronic diastolic CHF/PAH: TTE TTE with LVEF of 50 to 55% and severely enlarged RV and RVSP of 34.4 mmHg (60 to 65%,  indeterminate DD and RVSP of 42.8 mmHg in 2020).  Currently appears  euvolemic. -Fluid management by dialysis per nephrology.    Gangrene of left heel/peripheral artery disease -S/p left AKA by vascular surgery on 8/15. -Resume anticoagulation once okay from surgical standpoint -Pain control per surgery.  Chronic pedal edema/ Venous stasis/calciphylaxis of lower extremity Hypertrophic toenails -Continue Eucerin and Sarna lotion on legs -Podiatry, Dr. Lilian Kapur recommended outpatient follow-up for hypertrophic toenail    Mass in left subclavicular region- Sebaceous cyst? According to the patient and family, this mass is new, however, according to the CT scan that was performed today, the mass has been present since 2021 and is unchanged   Paroxysmal atrial fibrillation: Rate controlled.  History of HIT. -Continue amiodarone -Resume anticoagulation/warfarin when okay from surgical standpoint   Chronic hypotension: Soft blood pressures.  TTE as above. -Continue midodrine  Type B thoracic aortic dissection with aneurysmal dilatation of the proximal descending thoracic aorta up to 4.5 cm in diameter.  Legally blind  Anemia of chronic disease: Stable. Recent Labs    11/07/22 0048 11/08/22 0332 11/09/22 0221 11/11/22 0008 11/12/22 0049 11/14/22 0038 11/15/22 0108 11/16/22 0722 11/17/22 0039 11/17/22 0921  HGB 10.1* 10.2* 9.8* 10.9* 10.7* 9.7* 9.7* 9.7* 9.6* 10.9*  -Per nephrology  Bedbound/wheelchair-bound -PT/OT  Severe malnutrition Body mass index is 16.53 kg/m. Nutrition Problem: Severe Malnutrition Etiology: chronic illness (ESRD, CHF, PVD) Signs/Symptoms: severe fat depletion, severe muscle depletion, percent weight loss (14.6% weight loss in 3 months) Percent weight loss: 14.6 % Interventions: Ensure Enlive (each supplement provides 350kcal and 20 grams of protein), Magic cup, MVI, Liberalize Diet, Education   DVT prophylaxis:  SCD's Start: 11/17/22 1239  Code Status: Full code Family Communication: Updated patient's brother  and cousin over the phone on 8/14.  None at bedside today. Level of care: Telemetry Cardiac Status is: Inpatient Remains inpatient appropriate because: Left foot gangrene/ulcer   Final disposition: CIR Consultants:  Nephrology Vascular surgery Podiatry over the phone  35 minutes with more than 50% spent in reviewing records, counseling patient/family and coordinating care.   Sch Meds:  Scheduled Meds:  amiodarone  200 mg Oral Daily   Chlorhexidine Gluconate Cloth  6 each Topical Q0600   darbepoetin (ARANESP) injection - DIALYSIS  40 mcg Subcutaneous Q Sat-1800   [START ON 11/18/2022] docusate sodium  100 mg Oral Daily   feeding supplement  237 mL Oral BID BM   hydrocerin   Topical Daily   HYDROmorphone       lidocaine  3 patch Transdermal Q24H   midodrine  15 mg Oral TID WC   multivitamin  1 tablet Oral QHS   polyethylene glycol  17 g Oral BID   senna-docusate  1 tablet Oral BID   sevelamer carbonate  4.8 g Oral TID WC   Continuous Infusions:  anticoagulant sodium citrate 100 mL/hr at 11/17/22 0745   argatroban Stopped (11/17/22 0842)   PRN Meds:.acetaminophen, anticoagulant sodium citrate, calcium carbonate (dosed in mg elemental calcium), camphor-menthol **AND** hydrOXYzine, docusate sodium, HYDROmorphone, HYDROmorphone (DILAUDID) injection, ondansetron **OR** ondansetron (ZOFRAN) IV, oxyCODONE, phenol, sorbitol, zolpidem  Antimicrobials: Anti-infectives (From admission, onward)    Start     Dose/Rate Route Frequency Ordered Stop   11/17/22 0915  ceFAZolin (ANCEF) IVPB 2g/100 mL premix        2 g 200 mL/hr over 30 Minutes Intravenous  Once 11/17/22 0905 11/17/22 0934   11/17/22 0910  ceFAZolin (ANCEF) 2-4 GM/100ML-% IVPB       Note to Pharmacy: Samuella Cota O: cabinet override      11/17/22 0910 11/17/22 0945   11/11/22 2200  amoxicillin-clavulanate (AUGMENTIN) 500-125 MG per tablet 1 tablet        1 tablet Oral Daily at bedtime 11/11/22 1316 11/16/22 2202    11/08/22 1800  cefTRIAXone (ROCEPHIN) 2 g in sodium chloride 0.9 % 100 mL IVPB  Status:  Discontinued        2 g 200 mL/hr over 30 Minutes Intravenous Every 24 hours 11/08/22 1656 11/11/22 1316   11/03/22 0915  cefTRIAXone (ROCEPHIN) 2 g in sodium chloride 0.9 % 100 mL IVPB        2 g 200 mL/hr over 30 Minutes Intravenous Every 24 hours 11/03/22 0823 11/08/22 1701   11/03/22 0915  azithromycin (ZITHROMAX) 500 mg in sodium chloride 0.9 % 250 mL IVPB        500 mg 250 mL/hr over 60 Minutes Intravenous Every 24 hours 11/03/22 0823 11/08/22 1701        I have personally reviewed the following labs and images: CBC: Recent Labs  Lab 11/12/22 0049 11/14/22 0038 11/15/22 0108 11/16/22 0722 11/17/22 0039 11/17/22 0921  WBC 9.4 11.7* 10.3 11.0* 9.3  --   HGB 10.7* 9.7* 9.7* 9.7* 9.6* 10.9*  HCT 36.4* 32.7* 33.3* 33.2* 33.0* 32.0*  MCV 97.1 98.8 95.7 95.1 94.8  --   PLT 132* 144* 133* 136* 146*  --    BMP &GFR Recent Labs  Lab 11/12/22 0049 11/14/22 0038 11/15/22 0108 11/16/22 0722 11/17/22 0921 11/17/22 0937  NA 136 133* 136 135 138 138  K 4.1 3.9 4.0 4.0 4.3 4.3  CL 96* 96* 98 97* 100 101  CO2 23 27 25 25   --  26  GLUCOSE 81 101* 87 102* 86 86  BUN 41* 32* 43* 26* 33* 33*  CREATININE 6.26* 5.28* 6.66* 4.38* 5.80* 5.59*  CALCIUM 8.5* 8.3* 8.7* 8.8*  --  9.0  MG  --   --   --  2.0  --   --   PHOS  --  3.6  --  3.4  --   --    Estimated Creatinine Clearance: 13.1 mL/min (A) (by C-G formula based on SCr of 5.59 mg/dL (H)). Liver & Pancreas: Recent Labs  Lab 11/14/22 0038 11/16/22 0722  ALBUMIN 2.4* 2.5*   No results for input(s): "LIPASE", "AMYLASE" in the last 168 hours. No results for input(s): "AMMONIA" in the last 168 hours. Diabetic: No results for input(s): "HGBA1C" in the last 72 hours. Recent Labs  Lab 11/17/22 1101 11/17/22 1126 11/17/22 1326  GLUCAP 68* 115* 112*   Cardiac Enzymes: No results for input(s): "CKTOTAL", "CKMB", "CKMBINDEX", "TROPONINI"  in the last 168 hours. No results for input(s): "PROBNP" in the last 8760 hours. Coagulation Profile: Recent Labs  Lab 11/11/22 1120 11/12/22 0049 11/13/22 0059 11/14/22 0038 11/15/22 0108  INR 1.2 1.4* 1.4* 1.4* 1.4*   Thyroid Function Tests: No results for input(s): "TSH", "T4TOTAL", "FREET4", "T3FREE", "THYROIDAB" in the last 72 hours. Lipid Profile: No results for input(s): "CHOL", "HDL", "LDLCALC", "TRIG", "CHOLHDL", "LDLDIRECT"  in the last 72 hours. Anemia Panel: No results for input(s): "VITAMINB12", "FOLATE", "FERRITIN", "TIBC", "IRON", "RETICCTPCT" in the last 72 hours. Urine analysis: No results found for: "COLORURINE", "APPEARANCEUR", "LABSPEC", "PHURINE", "GLUCOSEU", "HGBUR", "BILIRUBINUR", "KETONESUR", "PROTEINUR", "UROBILINOGEN", "NITRITE", "LEUKOCYTESUR" Sepsis Labs: Invalid input(s): "PROCALCITONIN", "LACTICIDVEN"  Microbiology: No results found for this or any previous visit (from the past 240 hour(s)).  Radiology Studies: ECHOCARDIOGRAM COMPLETE  Result Date: 11/16/2022    ECHOCARDIOGRAM REPORT   Patient Name:   [name] Date of Exam: 11/16/2022 Medical Rec #:  [id number]    Height:       75.0 in Accession #:    [id number]   Weight:       129.9 lb Date of Birth:  05/20/1970    BSA:          1.826 m Patient Age:    52 years     BP:           84/53 mmHg Patient Gender: M            HR:           62 bpm. Exam Location:  Inpatient Procedure: 2D Echo, Cardiac Doppler and Color Doppler Indications:    Preoperative evaluation  History:        Patient has prior history of Echocardiogram examinations, most                 recent 05/20/2018. CHF, Arrythmias:Atrial Fibrillation,                 Signs/Symptoms:Chest Pain; Risk Factors:Hypertension. ESRD.  Sonographer:    Lucendia Herrlich Referring Phys: [id number]  T  IMPRESSIONS  1. Left ventricular ejection fraction, by estimation, is 50 to 55%. The left ventricle has low normal function. The left ventricle has no regional  wall motion abnormalities. Left ventricular diastolic parameters are indeterminate. There is the interventricular septum is flattened in systole and diastole, consistent with right ventricular pressure and volume overload.  2. Right ventricular systolic function is severely reduced. The right ventricular size is severely enlarged. Tricuspid regurgitation signal is inadequate for assessing PA pressure.  3. Left atrial size was mildly dilated.  4. Right atrial size was severely dilated.  5. The mitral valve is normal in structure. No evidence of mitral valve regurgitation. No evidence of mitral stenosis.  6. The aortic valve is tricuspid. Aortic valve regurgitation is not visualized. No aortic stenosis is present.  7. The inferior vena cava is normal in size with <50% respiratory variability, suggesting right atrial pressure of 8 mmHg. FINDINGS  Left Ventricle: Left ventricular ejection fraction, by estimation, is 50 to 55%. The left ventricle has low normal function. The left ventricle has no regional wall motion abnormalities. The left ventricular internal cavity size was normal in size. There is no left ventricular hypertrophy. The interventricular septum is flattened in systole and diastole, consistent with right ventricular pressure and volume overload. Left ventricular diastolic parameters are indeterminate. Right Ventricle: The right ventricular size is severely enlarged. No increase in right ventricular wall thickness. Right ventricular systolic function is severely reduced. Tricuspid regurgitation signal is inadequate for assessing PA pressure. The tricuspid regurgitant velocity is 2.57 m/s, and with an assumed right atrial pressure of 8 mmHg, the estimated right ventricular systolic pressure is 34.4 mmHg. Left Atrium: Left atrial size was mildly dilated. Right Atrium: Right atrial size was severely dilated. Pericardium: Trivial pericardial effusion is present. Mitral Valve: The mitral valve is normal in  structure. No evidence  of mitral valve regurgitation. No evidence of mitral valve stenosis. Tricuspid Valve: The tricuspid valve is normal in structure. Tricuspid valve regurgitation is mild . No evidence of tricuspid stenosis. Aortic Valve: The aortic valve is tricuspid. Aortic valve regurgitation is not visualized. No aortic stenosis is present. Aortic valve peak gradient measures 3.5 mmHg. Pulmonic Valve: The pulmonic valve was normal in structure. Pulmonic valve regurgitation is trivial. No evidence of pulmonic stenosis. Aorta: The aortic root is normal in size and structure. Venous: The inferior vena cava is normal in size with less than 50% respiratory variability, suggesting right atrial pressure of 8 mmHg. IAS/Shunts: There is left bowing of the interatrial septum, suggestive of elevated right atrial pressure. No atrial level shunt detected by color flow Doppler.  LEFT VENTRICLE PLAX 2D LVIDd:         3.80 cm   Diastology LVIDs:         2.30 cm   LV e' medial:    6.01 cm/s LV PW:         1.10 cm   LV E/e' medial:  8.7 LV IVS:        1.10 cm   LV e' lateral:   11.30 cm/s LVOT diam:     2.20 cm   LV E/e' lateral: 4.6 LV SV:         47 LV SV Index:   26 LVOT Area:     3.80 cm  RIGHT VENTRICLE             IVC RV S prime:     10.30 cm/s  IVC diam: 2.00 cm TAPSE (M-mode): 1.4 cm LEFT ATRIUM             Index        RIGHT ATRIUM           Index LA diam:        4.40 cm 2.41 cm/m   RA Area:     23.80 cm LA Vol (A2C):   42.9 ml 23.49 ml/m  RA Volume:   83.30 ml  45.61 ml/m LA Vol (A4C):   62.3 ml 34.11 ml/m LA Biplane Vol: 56.2 ml 30.77 ml/m  AORTIC VALVE                 PULMONIC VALVE AV Area (Vmax): 3.02 cm     PR End Diast Vel: 14.14 msec AV Vmax:        92.90 cm/s AV Peak Grad:   3.5 mmHg LVOT Vmax:      73.90 cm/s LVOT Vmean:     44.100 cm/s LVOT VTI:       0.123 m  AORTA Ao Root diam: 3.50 cm Ao Asc diam:  3.10 cm MITRAL VALVE               TRICUSPID VALVE MV Area (PHT): 2.80 cm    TR Peak grad:   26.4  mmHg MV Decel Time: 271 msec    TR Vmax:        257.00 cm/s MV E velocity: 52.20 cm/s MV A velocity: 25.70 cm/s  SHUNTS MV E/A ratio:  2.03        Systemic VTI:  0.12 m                            Systemic Diam: 2.20 cm Olga Millers MD Electronically signed by Olga Millers MD Signature Date/Time: 11/16/2022/4:07:01 PM    Final   T.  [facility]  If 7PM-7AM, please contact night-coverage [email or web address] 11/17/2022, 1:29 PM

## 2022-11-17 NOTE — Anesthesia Preprocedure Evaluation (Addendum)
Anesthesia Evaluation  Patient identified by MRN, date of birth, ID band Patient awake    Reviewed: Allergy & Precautions, H&P , NPO status , Patient's Chart, lab work & pertinent test results  Airway Mallampati: II  TM Distance: >3 FB Neck ROM: Full    Dental no notable dental hx.    Pulmonary neg pulmonary ROS, pneumonia presenting to the hospital with SOB and several days of left shoulder, chest and flank pain.  As a result of pain, he missed 2 HD sessions.  Per EMS, he was saturating at 85% on RA.  CT chest/abdomen/pelvis showed moderate to large loculated left-sided pleural effusion with lower lobe consolidation.  He was admitted for acute respiratory failure with hypoxia due to left lobar pneumonia with parapneumonic effusion.  PCCM consulted, and had a chest tube placed on 7/31 after INR reversal with vitamin K.  He required multiple rounds of lytics- chest tube removed 11/11/22   Pulmonary exam normal breath sounds clear to auscultation       Cardiovascular hypertension (117/68 preop), + Peripheral Vascular Disease and +CHF (normal LVEF, severe RV dysfunction)  negative cardio ROS Normal cardiovascular exam+ dysrhythmias (coumadin outpt) Atrial Fibrillation  Rhythm:Regular Rate:Normal  Echo 11/16/22: 1. Left ventricular ejection fraction, by estimation, is 50 to 55%. The  left ventricle has low normal function. The left ventricle has no regional  wall motion abnormalities. Left ventricular diastolic parameters are  indeterminate. There is the  interventricular septum is flattened in systole and diastole, consistent  with right ventricular pressure and volume overload.   2. Right ventricular systolic function is severely reduced. The right  ventricular size is severely enlarged. Tricuspid regurgitation signal is  inadequate for assessing PA pressure.   3. Left atrial size was mildly dilated.   4. Right atrial size was severely  dilated.   5. The mitral valve is normal in structure. No evidence of mitral valve  regurgitation. No evidence of mitral stenosis.   6. The aortic valve is tricuspid. Aortic valve regurgitation is not  visualized. No aortic stenosis is present.   7. The inferior vena cava is normal in size with <50% respiratory  variability, suggesting right atrial pressure of 8 mmHg.     Type B thoracic aortic dissection with aneurysmal dilatation of the proximal descending thoracic aorta up to 4.5 cm in diameter.     Neuro/Psych  Headaches  Neuromuscular disease (LE paralysis 2/2 aortic dissection 2004) negative neurological ROS  negative psych ROS   GI/Hepatic negative GI ROS, Neg liver ROS,,,  Endo/Other  negative endocrine ROS    Renal/GU ESRF and DialysisRenal diseasenegative Renal ROS  negative genitourinary   Musculoskeletal negative musculoskeletal ROS (+) Arthritis , Osteoarthritis,    Abdominal   Peds negative pediatric ROS (+)  Hematology negative hematology ROS (+) Blood dyscrasia, anemia Hb 9.6, plt 146   Anesthesia Other Findings blind  Reproductive/Obstetrics negative OB ROS                             Anesthesia Physical Anesthesia Plan  ASA: 4  Anesthesia Plan: General   Post-op Pain Management: Dilaudid IV   Induction: Intravenous  PONV Risk Score and Plan: 2 and Ondansetron, Treatment may vary due to age or medical condition and Midazolam  Airway Management Planned: LMA  Additional Equipment:   Intra-op Plan:   Post-operative Plan: Extubation in OR  Informed Consent:   Plan Discussed with:   Anesthesia Plan Comments:  Anesthesia Quick Evaluation

## 2022-11-17 NOTE — H&P (View-Only) (Signed)
  Progress Note    11/17/2022 7:11 AM 15 Days Post-Op  Subjective:  pt resting in his room.  He is scared about surgery.    afebrile  Vitals:   11/17/22 0405 11/17/22 0500  BP: 99/61 104/66  Pulse: (!) 57 (!) 54  Resp: 18 16  Temp: 98.2 F (36.8 C)   SpO2: 99% 99%    Physical Exam: General:  no distress Lungs:  non labored   CBC    Component Value Date/Time   WBC 9.3 11/17/2022 0039   RBC 3.48 (L) 11/17/2022 0039   HGB 9.6 (L) 11/17/2022 0039   HGB 11.4 (L) 08/31/2022 1137   HCT 33.0 (L) 11/17/2022 0039   HCT 34.7 (L) 08/31/2022 1137   PLT 146 (L) 11/17/2022 0039   PLT 135 (L) 08/31/2022 1137   MCV 94.8 11/17/2022 0039   MCV 93 08/31/2022 1137   MCH 27.6 11/17/2022 0039   MCHC 29.1 (L) 11/17/2022 0039   RDW 15.6 (H) 11/17/2022 0039   RDW 13.4 08/31/2022 1137   LYMPHSABS 0.8 11/07/2022 0048   LYMPHSABS 1.0 02/13/2017 1425   MONOABS 1.1 (H) 11/07/2022 0048   EOSABS 0.1 11/07/2022 0048   EOSABS 0.1 02/13/2017 1425   BASOSABS 0.0 11/07/2022 0048   BASOSABS 0.0 02/13/2017 1425    BMET    Component Value Date/Time   NA 135 11/16/2022 0722   NA 143 08/31/2022 1137   K 4.0 11/16/2022 0722   CL 97 (L) 11/16/2022 0722   CO2 25 11/16/2022 0722   GLUCOSE 102 (H) 11/16/2022 0722   BUN 26 (H) 11/16/2022 0722   BUN 57 (H) 08/31/2022 1137   CREATININE 4.38 (H) 11/16/2022 0722   CALCIUM 8.8 (L) 11/16/2022 0722   GFRNONAA 15 (L) 11/16/2022 0722   GFRAA 5 (L) 09/04/2019 1114    INR    Component Value Date/Time   INR 1.4 (H) 11/15/2022 0108     Intake/Output Summary (Last 24 hours) at 11/17/2022 0711 Last data filed at 11/17/2022 0100 Gross per 24 hour  Intake 57.54 ml  Output --  Net 57.54 ml      Assessment/Plan:  52 y.o. male with hx of aortic dissection with thoracic stent graft in 2004 in [place]. He has combined chronic venous insufficiency and PAD with chronic thickening of his skin with hyperkeratosis and new left heel ulceration   Hospital Day  21   -pt questions answered this morning.  He has additional questions for anesthesia.  Discussed with him that his surgery has been moved up and to let his family know.   -discussed AKA vs BKA due to his skin and knee contracture, it would be low likelihood of healing.   -DVT prophylaxis:  he is on argatroban   Doreatha Massed, PA-C [facility] [phone] 11/17/2022 7:11 AM

## 2022-11-18 ENCOUNTER — Encounter (HOSPITAL_COMMUNITY): Payer: Self-pay | Admitting: Surgery

## 2022-11-18 DIAGNOSIS — J9601 Acute respiratory failure with hypoxia: Secondary | ICD-10-CM | POA: Diagnosis not present

## 2022-11-18 DIAGNOSIS — J181 Lobar pneumonia, unspecified organism: Secondary | ICD-10-CM | POA: Diagnosis not present

## 2022-11-18 DIAGNOSIS — N186 End stage renal disease: Secondary | ICD-10-CM | POA: Diagnosis not present

## 2022-11-18 DIAGNOSIS — I5032 Chronic diastolic (congestive) heart failure: Secondary | ICD-10-CM | POA: Diagnosis not present

## 2022-11-18 LAB — CBC
HCT: 31.2 % — ABNORMAL LOW (ref 39.0–52.0)
Hemoglobin: 9.2 g/dL — ABNORMAL LOW (ref 13.0–17.0)
MCH: 28.5 pg (ref 26.0–34.0)
MCHC: 29.5 g/dL — ABNORMAL LOW (ref 30.0–36.0)
MCV: 96.6 fL (ref 80.0–100.0)
Platelets: 189 10*3/uL (ref 150–400)
RBC: 3.23 MIL/uL — ABNORMAL LOW (ref 4.22–5.81)
RDW: 15.9 % — ABNORMAL HIGH (ref 11.5–15.5)
WBC: 12 10*3/uL — ABNORMAL HIGH (ref 4.0–10.5)
nRBC: 0.2 % (ref 0.0–0.2)

## 2022-11-18 LAB — RENAL FUNCTION PANEL
Albumin: 2.6 g/dL — ABNORMAL LOW (ref 3.5–5.0)
Anion gap: 13 (ref 5–15)
BUN: 42 mg/dL — ABNORMAL HIGH (ref 6–20)
CO2: 24 mmol/L (ref 22–32)
Calcium: 8.4 mg/dL — ABNORMAL LOW (ref 8.9–10.3)
Chloride: 100 mmol/L (ref 98–111)
Creatinine, Ser: 6.32 mg/dL — ABNORMAL HIGH (ref 0.61–1.24)
GFR, Estimated: 10 mL/min — ABNORMAL LOW (ref >60)
Glucose, Bld: 142 mg/dL — ABNORMAL HIGH (ref 70–99)
Phosphorus: 4.1 mg/dL (ref 2.5–4.6)
Potassium: 4.6 mmol/L (ref 3.5–5.1)
Sodium: 137 mmol/L (ref 135–145)

## 2022-11-18 LAB — MAGNESIUM: Magnesium: 2 mg/dL (ref 1.7–2.4)

## 2022-11-18 LAB — SURGICAL PATHOLOGY

## 2022-11-18 LAB — APTT
aPTT: 85 s — ABNORMAL HIGH (ref 24–36)
aPTT: 87 seconds — ABNORMAL HIGH (ref 24–36)

## 2022-11-18 LAB — PROTIME-INR
INR: 3.2 — ABNORMAL HIGH (ref 0.8–1.2)
Prothrombin Time: 32.8 seconds — ABNORMAL HIGH (ref 11.4–15.2)

## 2022-11-18 MED ORDER — SEVELAMER CARBONATE 800 MG PO TABS
4800.0000 mg | ORAL_TABLET | Freq: Three times a day (TID) | ORAL | Status: DC
  Filled 2022-11-18: qty 6

## 2022-11-18 MED ORDER — SEVELAMER CARBONATE 800 MG PO TABS
2400.0000 mg | ORAL_TABLET | Freq: Three times a day (TID) | ORAL | Status: DC
  Administered 2022-11-18 (×2): 2400 mg via ORAL
  Filled 2022-11-18 (×3): qty 3

## 2022-11-18 MED ORDER — GABAPENTIN 100 MG PO CAPS
100.0000 mg | ORAL_CAPSULE | Freq: Two times a day (BID) | ORAL | Status: DC
  Administered 2022-11-18 – 2022-11-29 (×22): 100 mg via ORAL
  Filled 2022-11-18 (×22): qty 1

## 2022-11-18 NOTE — Progress Notes (Signed)
PT Cancellation Note  Patient Details Name: Corey [name] MRN: [id number] DOB: [birth year]   Cancelled Treatment:    Reason Eval/Treat Not Completed: Patient at procedure or test/unavailable. Pt in HD. Will return later.   Angelina Ok [facility] 11/18/2022, 8:50 AM Skip Mayer PT Acute [employer] [phone]

## 2022-11-18 NOTE — Progress Notes (Signed)
Inpatient Rehab Admissions Coordinator:    CIR following. Will see how he does with PT/OT post op and determine candidacy at that point.   Megan Salon, MS, CCC-SLP Rehab Admissions Coordinator  [phone] (celll) [phone] (office)

## 2022-11-18 NOTE — Progress Notes (Signed)
OT Cancellation Note  Patient Details Name: Corey [name] MRN: [id number] DOB: 1970-04-15   Cancelled Treatment:    Reason Eval/Treat Not Completed: Fatigue limiting ability to participate.  Pt politely declined secondary to being fatigued from dialysis earlier today.  Will check back tomorrow or as time permits.  Hosie Sharman OTR/L 11/18/2022, 2:11 PM

## 2022-11-18 NOTE — Progress Notes (Signed)
    Subjective  - POD #1, status post left above-knee amputation  Complaining of pain at the amputation site   Physical Exam:  Dressing is dry without drainage       Assessment/Plan:  POD #1  Plan for dressing change tomorrow  [name] 11/18/2022 7:59 AM --  Vitals:   11/18/22 0325 11/18/22 0731  BP: 112/71 118/74  Pulse: 62 62  Resp: 16   Temp: 97.9 F (36.6 C) 97.9 F (36.6 C)  SpO2: 100% 98%    Intake/Output Summary (Last 24 hours) at 11/18/2022 0759 Last data filed at 11/17/2022 1800 Gross per 24 hour  Intake 752.75 ml  Output 100 ml  Net 652.75 ml     Laboratory CBC    Component Value Date/Time   WBC 12.0 (H) 11/18/2022 0059   HGB 9.2 (L) 11/18/2022 0059   HGB 11.4 (L) 08/31/2022 1137   HCT 31.2 (L) 11/18/2022 0059   HCT 34.7 (L) 08/31/2022 1137   PLT 189 11/18/2022 0059   PLT 135 (L) 08/31/2022 1137    BMET    Component Value Date/Time   NA 137 11/18/2022 0059   NA 143 08/31/2022 1137   K 4.6 11/18/2022 0059   CL 100 11/18/2022 0059   CO2 24 11/18/2022 0059   GLUCOSE 142 (H) 11/18/2022 0059   BUN 42 (H) 11/18/2022 0059   BUN 57 (H) 08/31/2022 1137   CREATININE 6.32 (H) 11/18/2022 0059   CALCIUM 8.4 (L) 11/18/2022 0059   GFRNONAA 10 (L) 11/18/2022 0059   GFRAA 5 (L) 09/04/2019 1114    COAG Lab Results  Component Value Date   INR 3.2 (H) 11/18/2022   INR 1.4 (H) 11/15/2022   INR 1.4 (H) 11/14/2022   No results found for: "PTT"  Antibiotics Anti-infectives (From admission, onward)    Start     Dose/Rate Route Frequency Ordered Stop   11/17/22 0915  ceFAZolin (ANCEF) IVPB 2g/100 mL premix        2 g 200 mL/hr over 30 Minutes Intravenous  Once 11/17/22 0905 11/17/22 0934   11/17/22 0910  ceFAZolin (ANCEF) 2-4 GM/100ML-% IVPB       Note to Pharmacy: Samuella Cota O: cabinet override      11/17/22 0910 11/17/22 0945   11/11/22 2200  amoxicillin-clavulanate (AUGMENTIN) 500-125 MG per tablet 1 tablet        1 tablet Oral  Daily at bedtime 11/11/22 1316 11/16/22 2202   11/08/22 1800  cefTRIAXone (ROCEPHIN) 2 g in sodium chloride 0.9 % 100 mL IVPB  Status:  Discontinued        2 g 200 mL/hr over 30 Minutes Intravenous Every 24 hours 11/08/22 1656 11/11/22 1316   11/03/22 0915  cefTRIAXone (ROCEPHIN) 2 g in sodium chloride 0.9 % 100 mL IVPB        2 g 200 mL/hr over 30 Minutes Intravenous Every 24 hours 11/03/22 0823 11/08/22 1701   11/03/22 0915  azithromycin (ZITHROMAX) 500 mg in sodium chloride 0.9 % 250 mL IVPB        500 mg 250 mL/hr over 60 Minutes Intravenous Every 24 hours 11/03/22 0823 11/08/22 1701        V. Charlena Cross, M.D., [facility] [place] Office: [phone] Pager:  [phone]

## 2022-11-18 NOTE — Progress Notes (Addendum)
[facility] NEPHROLOGY PROGRESS NOTE  Assessment/ Plan: OP HD: TTS [facility] 4h  2/2 bath  66kg   TDC   no heparin (allergy)  lock TDC w/ citrate - No ESA or VDRA - Binder: Renvela pwdr 4.8g TID - Sensipar 180mg  every day at home  # Acute hypoxic respiratory failure: In setting of missed HD, pna w/ effusion. CXR with pleural effusion s/p chest tube 7/31 now removed. Resolved.  # PNA/ L parapneumonic pleural effusion: s/p thoracentesis with cultures. Got IV abx course and finished with augmentin  # ESRD: maintain TTS dialysis schedule. HD today and again tomorrow. Shorten to 3hrs, no UF  #Heparin allergy - use citrate for cath lock.  #Hypotension/ volume: BP low, LE edema resolved.  Is on midodrine. Will need lower EDW after discharge.  #Anemia of ESRD:  Hgb 9-10s - s/p Aranesp on 7/27.  CTM.  # Secondary HPTH: Ca/Phos ok - switched to sevelamer tabs at 2400 tid per pharm recs.  Held cinacalcet due to hypoCa but corrects near normal when accounting for albumin. Consider restarting  #A-fib: On amiodarone + warfarin, has IVC filter  #Hx type B aortic dissection s/p repair (2004)   #Left leg wound - s/p AKA 8/15     Subjective:  Some pain after surgery. Otherwise no complaints. Objective Vital signs in last 24 hours: Vitals:   11/18/22 1000 11/18/22 1030 11/18/22 1100 11/18/22 1130  BP: 120/74 127/78 111/71 125/71  Pulse: (!) 57 60 64 (!) 53  Resp: 12 14 18 17   Temp:      TempSrc:      SpO2: 100% 100% 99% (!) 85%  Weight:      Height:       Weight change:   Intake/Output Summary (Last 24 hours) at 11/18/2022 1211 Last data filed at 11/17/2022 1800 Gross per 24 hour  Intake 2.75 ml  Output --  Net 2.75 ml       Labs: RENAL PANEL Recent Labs  Lab 11/14/22 0038 11/15/22 0108 11/16/22 0722 11/17/22 0921 11/17/22 0937 11/18/22 0059  NA 133* 136 135 138 138 137  K 3.9 4.0 4.0 4.3 4.3 4.6  CL 96* 98 97* 100 101 100  CO2 27 25 25   --  26 24   GLUCOSE 101* 87 102* 86 86 142*  BUN 32* 43* 26* 33* 33* 42*  CREATININE 5.28* 6.66* 4.38* 5.80* 5.59* 6.32*  CALCIUM 8.3* 8.7* 8.8*  --  9.0 8.4*  MG  --   --  2.0  --   --  2.0  PHOS 3.6  --  3.4  --   --  4.1  ALBUMIN 2.4*  --  2.5*  --   --  2.6*    Liver Function Tests: Recent Labs  Lab 11/14/22 0038 11/16/22 0722 11/18/22 0059  ALBUMIN 2.4* 2.5* 2.6*   No results for input(s): "LIPASE", "AMYLASE" in the last 168 hours. No results for input(s): "AMMONIA" in the last 168 hours. CBC: Recent Labs    11/15/22 0108 11/16/22 0722 11/17/22 0039 11/17/22 0921 11/18/22 0059  HGB 9.7* 9.7* 9.6* 10.9* 9.2*  MCV 95.7 95.1 94.8  --  96.6    Cardiac Enzymes: No results for input(s): "CKTOTAL", "CKMB", "CKMBINDEX", "TROPONINI" in the last 168 hours. CBG: Recent Labs  Lab 11/17/22 1101 11/17/22 1126 11/17/22 1326  GLUCAP 68* 115* 112*    Iron Studies: No results for input(s): "IRON", "TIBC", "TRANSFERRIN", "FERRITIN" in the last 72 hours. Studies/Results: ECHOCARDIOGRAM COMPLETE  Result Date: 11/16/2022    ECHOCARDIOGRAM REPORT   Patient Name:   Corey [name] Date of Exam: 11/16/2022 Medical Rec #:  [id number]    Height:       75.0 in Accession #:    [id number]   Weight:       129.9 lb Date of Birth:  26-Aug-1970    BSA:          1.826 m Patient Age:    52 years     BP:           84/53 mmHg Patient Gender: M            HR:           62 bpm. Exam Location:  Inpatient Procedure: 2D Echo, Cardiac Doppler and Color Doppler Indications:    Preoperative evaluation  History:        Patient has prior history of Echocardiogram examinations, most                 recent 05/20/2018. CHF, Arrythmias:Atrial Fibrillation,                 Signs/Symptoms:Chest Pain; Risk Factors:Hypertension. ESRD.  Sonographer:    Lucendia Herrlich Referring Phys: [id number] TAYE T GONFA IMPRESSIONS  1. Left ventricular ejection fraction, by estimation, is 50 to 55%. The left ventricle has low normal function. The left  ventricle has no regional wall motion abnormalities. Left ventricular diastolic parameters are indeterminate. There is the interventricular septum is flattened in systole and diastole, consistent with right ventricular pressure and volume overload.  2. Right ventricular systolic function is severely reduced. The right ventricular size is severely enlarged. Tricuspid regurgitation signal is inadequate for assessing PA pressure.  3. Left atrial size was mildly dilated.  4. Right atrial size was severely dilated.  5. The mitral valve is normal in structure. No evidence of mitral valve regurgitation. No evidence of mitral stenosis.  6. The aortic valve is tricuspid. Aortic valve regurgitation is not visualized. No aortic stenosis is present.  7. The inferior vena cava is normal in size with <50% respiratory variability, suggesting right atrial pressure of 8 mmHg. FINDINGS  Left Ventricle: Left ventricular ejection fraction, by estimation, is 50 to 55%. The left ventricle has low normal function. The left ventricle has no regional wall motion abnormalities. The left ventricular internal cavity size was normal in size. There is no left ventricular hypertrophy. The interventricular septum is flattened in systole and diastole, consistent with right ventricular pressure and volume overload. Left ventricular diastolic parameters are indeterminate. Right Ventricle: The right ventricular size is severely enlarged. No increase in right ventricular wall thickness. Right ventricular systolic function is severely reduced. Tricuspid regurgitation signal is inadequate for assessing PA pressure. The tricuspid regurgitant velocity is 2.57 m/s, and with an assumed right atrial pressure of 8 mmHg, the estimated right ventricular systolic pressure is 34.4 mmHg. Left Atrium: Left atrial size was mildly dilated. Right Atrium: Right atrial size was severely dilated. Pericardium: Trivial pericardial effusion is present. Mitral Valve: The  mitral valve is normal in structure. No evidence of mitral valve regurgitation. No evidence of mitral valve stenosis. Tricuspid Valve: The tricuspid valve is normal in structure. Tricuspid valve regurgitation is mild . No evidence of tricuspid stenosis. Aortic Valve: The aortic valve is tricuspid. Aortic valve regurgitation is not visualized. No aortic stenosis is present. Aortic valve peak gradient measures 3.5 mmHg. Pulmonic Valve: The pulmonic valve was normal in structure. Pulmonic valve regurgitation is  trivial. No evidence of pulmonic stenosis. Aorta: The aortic root is normal in size and structure. Venous: The inferior vena cava is normal in size with less than 50% respiratory variability, suggesting right atrial pressure of 8 mmHg. IAS/Shunts: There is left bowing of the interatrial septum, suggestive of elevated right atrial pressure. No atrial level shunt detected by color flow Doppler.  LEFT VENTRICLE PLAX 2D LVIDd:         3.80 cm   Diastology LVIDs:         2.30 cm   LV e' medial:    6.01 cm/s LV PW:         1.10 cm   LV E/e' medial:  8.7 LV IVS:        1.10 cm   LV e' lateral:   11.30 cm/s LVOT diam:     2.20 cm   LV E/e' lateral: 4.6 LV SV:         47 LV SV Index:   26 LVOT Area:     3.80 cm  RIGHT VENTRICLE             IVC RV S prime:     10.30 cm/s  IVC diam: 2.00 cm TAPSE (M-mode): 1.4 cm LEFT ATRIUM             Index        RIGHT ATRIUM           Index LA diam:        4.40 cm 2.41 cm/m   RA Area:     23.80 cm LA Vol (A2C):   42.9 ml 23.49 ml/m  RA Volume:   83.30 ml  45.61 ml/m LA Vol (A4C):   62.3 ml 34.11 ml/m LA Biplane Vol: 56.2 ml 30.77 ml/m  AORTIC VALVE                 PULMONIC VALVE AV Area (Vmax): 3.02 cm     PR End Diast Vel: 14.14 msec AV Vmax:        92.90 cm/s AV Peak Grad:   3.5 mmHg LVOT Vmax:      73.90 cm/s LVOT Vmean:     44.100 cm/s LVOT VTI:       0.123 m  AORTA Ao Root diam: 3.50 cm Ao Asc diam:  3.10 cm MITRAL VALVE               TRICUSPID VALVE MV Area (PHT): 2.80  cm    TR Peak grad:   26.4 mmHg MV Decel Time: 271 msec    TR Vmax:        257.00 cm/s MV E velocity: 52.20 cm/s MV A velocity: 25.70 cm/s  SHUNTS MV E/A ratio:  2.03        Systemic VTI:  0.12 m                            Systemic Diam: 2.20 cm Olga Millers MD Electronically signed by Olga Millers MD Signature Date/Time: 11/16/2022/4:07:01 PM    Final     Medications: Infusions:  anticoagulant sodium citrate 100 mL/hr at 11/17/22 1800   argatroban 1 mcg/kg/min (11/18/22 0203)    Scheduled Medications:  amiodarone  200 mg Oral Daily   Chlorhexidine Gluconate Cloth  6 each Topical Q0600   darbepoetin (ARANESP) injection - DIALYSIS  40 mcg Subcutaneous Q Sat-1800   docusate sodium  100 mg Oral Daily   feeding  supplement  237 mL Oral BID BM   hydrocerin   Topical Daily   lidocaine  3 patch Transdermal Q24H   midodrine  15 mg Oral TID WC   multivitamin  1 tablet Oral QHS   polyethylene glycol  17 g Oral BID   senna-docusate  1 tablet Oral BID   sevelamer carbonate  2,400 mg Oral TID WC    have reviewed scheduled and prn medications.  Physical Exam: General:NAD, comfortable Heart:normal rate, no rub Lungs:clear b/l, no crackle Abdomen:soft, Non-tender, non-distended Extremities:No edema, left AKA Dialysis Access: [facility]   Bernestine Amass Tailer Volkert 11/18/2022,12:11 PM  LOS: 22 days

## 2022-11-18 NOTE — Progress Notes (Signed)
This chaplain responded to RN-Tamara's page for Pt. spiritual care.  The chaplain listened reflectively as the Pt. shared stories of his life with serious illness. The Pt. highlighted today's pain in the space of wanting to question his faith and recognizing his faith keeps his life balanced. As we continue to talk, the Pt. shares his gratitude for family support and visits.   The chaplain understands the Pt. is questioning "why me?" The Pt. is open to continued spiritual care visits and amputee peer support joining him in this season of self-discovery. This chaplain will contact rehab for information about peer support.  The Pt. accepted the chaplain's invitation for prayer. The chaplain updated the RN and will make a referral to [facility].  Chaplain Stephanie Acre [phone]

## 2022-11-18 NOTE — Progress Notes (Addendum)
  Progress Note    11/18/2022 7:57 AM 1 Day Post-Op  Subjective:  no complaints this morning   Vitals:   11/18/22 0325 11/18/22 0731  BP: 112/71 118/74  Pulse: 62 62  Resp: 16   Temp: 97.9 F (36.6 C) 97.9 F (36.6 C)  SpO2: 100% 98%    Physical Exam: General:  resting comfortably Cardiac:  regular Incisions:  L AKA wrapped and dry   CBC    Component Value Date/Time   WBC 12.0 (H) 11/18/2022 0059   RBC 3.23 (L) 11/18/2022 0059   HGB 9.2 (L) 11/18/2022 0059   HGB 11.4 (L) 08/31/2022 1137   HCT 31.2 (L) 11/18/2022 0059   HCT 34.7 (L) 08/31/2022 1137   PLT 189 11/18/2022 0059   PLT 135 (L) 08/31/2022 1137   MCV 96.6 11/18/2022 0059   MCV 93 08/31/2022 1137   MCH 28.5 11/18/2022 0059   MCHC 29.5 (L) 11/18/2022 0059   RDW 15.9 (H) 11/18/2022 0059   RDW 13.4 08/31/2022 1137   LYMPHSABS 0.8 11/07/2022 0048   LYMPHSABS 1.0 02/13/2017 1425   MONOABS 1.1 (H) 11/07/2022 0048   EOSABS 0.1 11/07/2022 0048   EOSABS 0.1 02/13/2017 1425   BASOSABS 0.0 11/07/2022 0048   BASOSABS 0.0 02/13/2017 1425    BMET    Component Value Date/Time   NA 137 11/18/2022 0059   NA 143 08/31/2022 1137   K 4.6 11/18/2022 0059   CL 100 11/18/2022 0059   CO2 24 11/18/2022 0059   GLUCOSE 142 (H) 11/18/2022 0059   BUN 42 (H) 11/18/2022 0059   BUN 57 (H) 08/31/2022 1137   CREATININE 6.32 (H) 11/18/2022 0059   CALCIUM 8.4 (L) 11/18/2022 0059   GFRNONAA 10 (L) 11/18/2022 0059   GFRAA 5 (L) 09/04/2019 1114    INR    Component Value Date/Time   INR 3.2 (H) 11/18/2022 0059     Intake/Output Summary (Last 24 hours) at 11/18/2022 0757 Last data filed at 11/17/2022 1800 Gross per 24 hour  Intake 752.75 ml  Output 100 ml  Net 652.75 ml      Assessment/Plan:  52 y.o. male is 1 day post op, s/p: left AKA    -Doing fine this morning. Had some pain in the left AKA stump overnight but this is okay this morning -L AKA currently wrapped and dry. Will take down the dressing this  weekend -Hgb with slight drop as expected after surgery. At 9.2 this morning -Mobilize as tolerated with PT/OT  Loel Dubonnet, PA-C [facility] [phone] 11/18/2022 7:57 AM  I have independently interviewed and examined patient and agree with PA assessment and plan above.  Patient currently on dialysis.  Plan for dressing change tomorrow.  Kasey Hansell C. Randie Heinz, MD [facility] [place] Office: [phone] Pager: [phone]

## 2022-11-18 NOTE — Progress Notes (Signed)
PROGRESS NOTE  KOLSON SARAFIN LFY:[id number] DOB: 07/09/1970   PCP: [name], NP  Patient is from: Home  DOA: 10/27/2022 LOS: 22  Chief complaints Chief Complaint  Patient presents with   Flank Pain   Shoulder Pain     Brief Narrative / Interim history: 52 year old M with PMH of ESRD on HD TTS, calciphylaxis, paroxysmal A-fib on warfarin, type B aortic dissection, PAD, chronic LE edema, severe malnutrition, blindness and nonambulatory status presenting to the hospital with SOB and several days of left shoulder, chest and flank pain.  As a result of pain, he missed 2 HD sessions.  Per EMS, he was saturating at 85% on RA.  CT chest/abdomen/pelvis showed moderate to large loculated left-sided pleural effusion with lower lobe consolidation.  He was admitted for acute respiratory failure with hypoxia due to left lobar pneumonia with parapneumonic effusion.  PCCM consulted, and had a chest tube placed on 7/31 after INR reversal with vitamin K.  He required multiple rounds of lytics.  Pleural fluid cultures NGTD.  Started on ceftriaxone and Zithromax on 8/1.  Transitioned to p.o. Augmentin on 8/9 that he will continue until 8/15.  Chest tube removed on 8/9.  Found to have left heel necrosis on 8/12.  Vascular surgery consulted and patient underwent left AKA on 8/15.  Therapy recommended CIR.  CIR following.   Subjective: Seen and examined earlier this afternoon he returned from dialysis.  Endorses pain at surgical site.  Describes the pain as throbbing.  Objective: Vitals:   11/18/22 1505 11/18/22 1506 11/18/22 1559 11/18/22 1600  BP: 106/60 106/60 (!) 102/56 (!) 102/56  Pulse: 66 66 67 67  Resp: 15 14 (!) 0 (!) 0  Temp:  98.2 F (36.8 C)    TempSrc:  Oral    SpO2: 100% 100% 100% 100%  Weight:      Height:        Examination:  GENERAL: Appears frail.  No apparent distress.  Nontoxic. HEENT: MMM.  Hearing grossly intact.  Legally blind. NECK: Supple.  No apparent JVD.   RESP:  No IWOB.  Fair aeration bilaterally. CVS:  RRR. Heart sounds normal.  ABD/GI/GU: BS+. Abd soft, NTND.  MSK/EXT: Ace wrap over left AKA DCI. SKIN: Tree bark skin is in BLE as seen in picture below. NEURO: Awake, alert and oriented appropriately.  No apparent focal neuro deficit. PSYCH: Calm. Normal affect.    Procedures:  7/31-8/9-left chest tube 8/15-left AKA  Microbiology summarized: 7/26-MRSA PCR screen nonreactive 7/31-pleural fluid culture NGTD 7/31-pleural fluid AFB culture pending.  Smear negative. 7/31-pleural fluid fungal culture pending.  Assessment and plan: Principal Problem:   Acute respiratory failure with hypoxia (HCC) Active Problems:   ESRD on dialysis [facility])   Calciphylaxis   Essential hypertension   Chronic diastolic CHF (congestive heart failure) (HCC)   Atrial fibrillation, chronic (HCC)   End-stage renal disease on hemodialysis (HCC)   AAA (abdominal aortic aneurysm) without rupture (HCC)   Hyperkalemia   PVD (peripheral vascular disease) (HCC)   Constipation   Lobar pneumonia (HCC)   Parapneumonic effusion   Gangrene (HCC)   Protein-calorie malnutrition, severe  Acute respiratory failure with hypoxia due to left lower lobe pneumonia with parapneumonic effusion and fluid overload: TTE with LVEF of 50 to 55% and severely enlarged RV and RVSP of 34.4 mmHg.  Seems to have resolved.  Intermittently placed on 2 L by  although saturating in upper 90s. -Treated with dialysis, left chest tube placement and antibiotics. -  Chest tube removed 8/9. -CTX and Zithromax 8/1>>> p.o. Augmentin on 8/9-8/14. -Fluid management by dialysis -Wean oxygen, incentive spirometry, OOB, PT/OT   ESRD on HD TTS: Reportedly missed 2 HD sessions due to pain. -Dialysis per nephrology  Acute on chronic diastolic CHF/PAH: TTE TTE with LVEF of 50 to 55% and severely enlarged RV and RVSP of 34.4 mmHg (60 to 65%,  indeterminate DD and RVSP of 42.8 mmHg in 2020).  Currently  appears euvolemic. -Fluid management by dialysis per nephrology.    Gangrene of left heel/peripheral artery disease -S/p left AKA by vascular surgery on 8/15. -Resume anticoagulation once okay from surgical standpoint -Continue as needed Tylenol, oxycodone and Dilaudid. -Added gabapentin 100 mg twice daily  Chronic pedal edema/ Venous stasis/calciphylaxis of lower extremity Hypertrophic toenails -Continue Eucerin and Sarna lotion on legs -Podiatry, Dr. Lilian Kapur recommended outpatient follow-up for hypertrophic toenail    Mass in left subclavicular region- Sebaceous cyst? According to the patient and family, this mass is new, however, according to the CT scan that was performed today, the mass has been present since 2021 and is unchanged   Paroxysmal atrial fibrillation: Rate controlled.  History of HIT. -Continue amiodarone -Resume anticoagulation/warfarin when okay from surgical standpoint   Chronic hypotension: Soft blood pressures.  TTE as above. -Continue midodrine  Type B thoracic aortic dissection with aneurysmal dilatation of the proximal descending thoracic aorta up to 4.5 cm in diameter.  Legally blind  Anemia of chronic disease: Stable. Recent Labs    11/08/22 0332 11/09/22 0221 11/11/22 0008 11/12/22 0049 11/14/22 0038 11/15/22 0108 11/16/22 0722 11/17/22 0039 11/17/22 0921 11/18/22 0059  HGB 10.2* 9.8* 10.9* 10.7* 9.7* 9.7* 9.7* 9.6* 10.9* 9.2*  -Per nephrology  Bedbound/wheelchair-bound -PT/OT-recommended CIR.  Severe malnutrition Body mass index is 16.15 kg/m. Nutrition Problem: Severe Malnutrition Etiology: chronic illness (ESRD, CHF, PVD) Signs/Symptoms: severe fat depletion, severe muscle depletion, percent weight loss (14.6% weight loss in 3 months) Percent weight loss: 14.6 % Interventions: Ensure Enlive (each supplement provides 350kcal and 20 grams of protein), Magic cup, MVI, Liberalize Diet, Education   DVT prophylaxis:  SCD's Start:  11/17/22 1239  Code Status: Full code Family Communication: None at bedside today. Level of care: Med-Surg Status is: Inpatient Remains inpatient appropriate because: Left foot gangrene/ulcer   Final disposition: CIR Consultants:  Nephrology Vascular surgery Podiatry over the phone  35 minutes with more than 50% spent in reviewing records, counseling patient/family and coordinating care.   Sch Meds:  Scheduled Meds:  amiodarone  200 mg Oral Daily   Chlorhexidine Gluconate Cloth  6 each Topical Q0600   darbepoetin (ARANESP) injection - DIALYSIS  40 mcg Subcutaneous Q Sat-1800   docusate sodium  100 mg Oral Daily   feeding supplement  237 mL Oral BID BM   gabapentin  100 mg Oral BID   hydrocerin   Topical Daily   lidocaine  3 patch Transdermal Q24H   midodrine  15 mg Oral TID WC   multivitamin  1 tablet Oral QHS   polyethylene glycol  17 g Oral BID   senna-docusate  1 tablet Oral BID   sevelamer carbonate  2,400 mg Oral TID WC   Continuous Infusions:  anticoagulant sodium citrate 100 mL/hr at 11/18/22 1400   argatroban Stopped (11/18/22 1551)   PRN Meds:.acetaminophen, anticoagulant sodium citrate, calcium carbonate (dosed in mg elemental calcium), camphor-menthol **AND** hydrOXYzine, docusate sodium, HYDROmorphone (DILAUDID) injection, ondansetron **OR** ondansetron (ZOFRAN) IV, oxyCODONE, phenol, sorbitol, zolpidem  Antimicrobials: Anti-infectives (From admission, onward)  Start     Dose/Rate Route Frequency Ordered Stop   11/17/22 0915  ceFAZolin (ANCEF) IVPB 2g/100 mL premix        2 g 200 mL/hr over 30 Minutes Intravenous  Once 11/17/22 0905 11/17/22 0934   11/17/22 0910  ceFAZolin (ANCEF) 2-4 GM/100ML-% IVPB       Note to Pharmacy: Samuella Cota O: cabinet override      11/17/22 0910 11/17/22 0945   11/11/22 2200  amoxicillin-clavulanate (AUGMENTIN) 500-125 MG per tablet 1 tablet        1 tablet Oral Daily at bedtime 11/11/22 1316 11/16/22 2202   11/08/22  1800  cefTRIAXone (ROCEPHIN) 2 g in sodium chloride 0.9 % 100 mL IVPB  Status:  Discontinued        2 g 200 mL/hr over 30 Minutes Intravenous Every 24 hours 11/08/22 1656 11/11/22 1316   11/03/22 0915  cefTRIAXone (ROCEPHIN) 2 g in sodium chloride 0.9 % 100 mL IVPB        2 g 200 mL/hr over 30 Minutes Intravenous Every 24 hours 11/03/22 0823 11/08/22 1701   11/03/22 0915  azithromycin (ZITHROMAX) 500 mg in sodium chloride 0.9 % 250 mL IVPB        500 mg 250 mL/hr over 60 Minutes Intravenous Every 24 hours 11/03/22 0823 11/08/22 1701        I have personally reviewed the following labs and images: CBC: Recent Labs  Lab 11/14/22 0038 11/15/22 0108 11/16/22 0722 11/17/22 0039 11/17/22 0921 11/18/22 0059  WBC 11.7* 10.3 11.0* 9.3  --  12.0*  HGB 9.7* 9.7* 9.7* 9.6* 10.9* 9.2*  HCT 32.7* 33.3* 33.2* 33.0* 32.0* 31.2*  MCV 98.8 95.7 95.1 94.8  --  96.6  PLT 144* 133* 136* 146*  --  189   BMP &GFR Recent Labs  Lab 11/14/22 0038 11/15/22 0108 11/16/22 0722 11/17/22 0921 11/17/22 0937 11/18/22 0059  NA 133* 136 135 138 138 137  K 3.9 4.0 4.0 4.3 4.3 4.6  CL 96* 98 97* 100 101 100  CO2 27 25 25   --  26 24  GLUCOSE 101* 87 102* 86 86 142*  BUN 32* 43* 26* 33* 33* 42*  CREATININE 5.28* 6.66* 4.38* 5.80* 5.59* 6.32*  CALCIUM 8.3* 8.7* 8.8*  --  9.0 8.4*  MG  --   --  2.0  --   --  2.0  PHOS 3.6  --  3.4  --   --  4.1   Estimated Creatinine Clearance: 11.3 mL/min (A) (by C-G formula based on SCr of 6.32 mg/dL (H)). Liver & Pancreas: Recent Labs  Lab 11/14/22 0038 11/16/22 0722 11/18/22 0059  ALBUMIN 2.4* 2.5* 2.6*   No results for input(s): "LIPASE", "AMYLASE" in the last 168 hours. No results for input(s): "AMMONIA" in the last 168 hours. Diabetic: No results for input(s): "HGBA1C" in the last 72 hours. Recent Labs  Lab 11/17/22 1101 11/17/22 1126 11/17/22 1326  GLUCAP 68* 115* 112*   Cardiac Enzymes: No results for input(s): "CKTOTAL", "CKMB", "CKMBINDEX",  "TROPONINI" in the last 168 hours. No results for input(s): "PROBNP" in the last 8760 hours. Coagulation Profile: Recent Labs  Lab 11/12/22 0049 11/13/22 0059 11/14/22 0038 11/15/22 0108 11/18/22 0059  INR 1.4* 1.4* 1.4* 1.4* 3.2*   Thyroid Function Tests: No results for input(s): "TSH", "T4TOTAL", "FREET4", "T3FREE", "THYROIDAB" in the last 72 hours. Lipid Profile: No results for input(s): "CHOL", "HDL", "LDLCALC", "TRIG", "CHOLHDL", "LDLDIRECT" in the last 72 hours. Anemia Panel: No results  for input(s): "VITAMINB12", "FOLATE", "FERRITIN", "TIBC", "IRON", "RETICCTPCT" in the last 72 hours. Urine analysis: No results found for: "COLORURINE", "APPEARANCEUR", "LABSPEC", "PHURINE", "GLUCOSEU", "HGBUR", "BILIRUBINUR", "KETONESUR", "PROTEINUR", "UROBILINOGEN", "NITRITE", "LEUKOCYTESUR" Sepsis Labs: Invalid input(s): "PROCALCITONIN", "LACTICIDVEN"  Microbiology: No results found for this or any previous visit (from the past 240 hour(s)).  Radiology Studies: No results found.    Rosebud Koenen T. Starlette Thurow [facility]  If 7PM-7AM, please contact night-coverage [email or web address] 11/18/2022, 4:54 PM

## 2022-11-18 NOTE — Progress Notes (Signed)
PT Cancellation Note  Patient Details Name: Corey [name] MRN: [id number] DOB: [birth year]   Cancelled Treatment:    Reason Eval/Treat Not Completed: Fatigue limiting ability to participate;Pain limiting ability to participate. Pt states he is fatigued after HD all morning and painful AKA. Will try again tomorrow.   Angelina Ok [facility] 11/18/2022, 3:43 PM Skip Mayer PT Acute [employer] [phone]

## 2022-11-18 NOTE — Procedures (Signed)
I was present at this dialysis session. I have reviewed the session itself and made appropriate changes.   Filed Weights   11/15/22 1807 11/17/22 0758 11/18/22 0833  Weight: 58.9 kg 60 kg 59.4 kg    Recent Labs  Lab 11/18/22 0059  NA 137  K 4.6  CL 100  CO2 24  GLUCOSE 142*  BUN 42*  CREATININE 6.32*  CALCIUM 8.4*  PHOS 4.1    Recent Labs  Lab 11/16/22 0722 11/17/22 0039 11/17/22 0921 11/18/22 0059  WBC 11.0* 9.3  --  12.0*  HGB 9.7* 9.6* 10.9* 9.2*  HCT 33.2* 33.0* 32.0* 31.2*  MCV 95.1 94.8  --  96.6  PLT 136* 146*  --  189    Scheduled Meds:  amiodarone  200 mg Oral Daily   Chlorhexidine Gluconate Cloth  6 each Topical Q0600   darbepoetin (ARANESP) injection - DIALYSIS  40 mcg Subcutaneous Q Sat-1800   docusate sodium  100 mg Oral Daily   feeding supplement  237 mL Oral BID BM   hydrocerin   Topical Daily   lidocaine  3 patch Transdermal Q24H   midodrine  15 mg Oral TID WC   multivitamin  1 tablet Oral QHS   polyethylene glycol  17 g Oral BID   senna-docusate  1 tablet Oral BID   sevelamer carbonate  2,400 mg Oral TID WC   Continuous Infusions:  anticoagulant sodium citrate 100 mL/hr at 11/17/22 1800   argatroban 1 mcg/kg/min (11/18/22 0203)   PRN Meds:.acetaminophen, anticoagulant sodium citrate, calcium carbonate (dosed in mg elemental calcium), camphor-menthol **AND** hydrOXYzine, docusate sodium, HYDROmorphone (DILAUDID) injection, ondansetron **OR** ondansetron (ZOFRAN) IV, oxyCODONE, phenol, sorbitol, zolpidem   Louie Bun,  MD 11/18/2022, 12:12 PM

## 2022-11-18 NOTE — Progress Notes (Signed)
Received patient in bed.Awake ,alert and oriented x 4.  Access used :Right HD catheter that worked well.Dressing on date.  Medicine given : Midodrine 15 mg.  Duration of treatment:3.25 hours.  Fluid removed:1 liter.  Hemo comment:Tolerated treatment.  Hand off to the patient's nurse:

## 2022-11-19 DIAGNOSIS — N186 End stage renal disease: Secondary | ICD-10-CM | POA: Diagnosis not present

## 2022-11-19 DIAGNOSIS — J181 Lobar pneumonia, unspecified organism: Secondary | ICD-10-CM | POA: Diagnosis not present

## 2022-11-19 DIAGNOSIS — J9601 Acute respiratory failure with hypoxia: Secondary | ICD-10-CM | POA: Diagnosis not present

## 2022-11-19 DIAGNOSIS — I5032 Chronic diastolic (congestive) heart failure: Secondary | ICD-10-CM | POA: Diagnosis not present

## 2022-11-19 LAB — RENAL FUNCTION PANEL
Albumin: 2.5 g/dL — ABNORMAL LOW (ref 3.5–5.0)
Anion gap: 13 (ref 5–15)
BUN: 27 mg/dL — ABNORMAL HIGH (ref 6–20)
CO2: 28 mmol/L (ref 22–32)
Calcium: 8.7 mg/dL — ABNORMAL LOW (ref 8.9–10.3)
Chloride: 94 mmol/L — ABNORMAL LOW (ref 98–111)
Creatinine, Ser: 4.03 mg/dL — ABNORMAL HIGH (ref 0.61–1.24)
GFR, Estimated: 17 mL/min — ABNORMAL LOW (ref >60)
Glucose, Bld: 93 mg/dL (ref 70–99)
Phosphorus: 3.6 mg/dL (ref 2.5–4.6)
Potassium: 3.8 mmol/L (ref 3.5–5.1)
Sodium: 135 mmol/L (ref 135–145)

## 2022-11-19 LAB — CBC
HCT: 28.6 % — ABNORMAL LOW (ref 39.0–52.0)
Hemoglobin: 8.5 g/dL — ABNORMAL LOW (ref 13.0–17.0)
MCH: 27.6 pg (ref 26.0–34.0)
MCHC: 29.7 g/dL — ABNORMAL LOW (ref 30.0–36.0)
MCV: 92.9 fL (ref 80.0–100.0)
Platelets: 155 10*3/uL (ref 150–400)
RBC: 3.08 MIL/uL — ABNORMAL LOW (ref 4.22–5.81)
RDW: 16 % — ABNORMAL HIGH (ref 11.5–15.5)
WBC: 11.8 10*3/uL — ABNORMAL HIGH (ref 4.0–10.5)
nRBC: 0 % (ref 0.0–0.2)

## 2022-11-19 LAB — APTT: aPTT: 78 s — ABNORMAL HIGH (ref 24–36)

## 2022-11-19 LAB — PROTIME-INR
INR: 2.7 — ABNORMAL HIGH (ref 0.8–1.2)
Prothrombin Time: 29.3 seconds — ABNORMAL HIGH (ref 11.4–15.2)

## 2022-11-19 LAB — MAGNESIUM: Magnesium: 1.9 mg/dL (ref 1.7–2.4)

## 2022-11-19 MED ORDER — HYDROMORPHONE HCL 1 MG/ML IJ SOLN
0.5000 mg | INTRAMUSCULAR | Status: DC | PRN
  Administered 2022-11-19 – 2022-11-20 (×6): 0.5 mg via INTRAVENOUS
  Filled 2022-11-19 (×6): qty 0.5

## 2022-11-19 MED ORDER — ACETAMINOPHEN 325 MG PO TABS
650.0000 mg | ORAL_TABLET | Freq: Four times a day (QID) | ORAL | Status: DC
  Administered 2022-11-20 – 2022-11-29 (×32): 650 mg via ORAL
  Filled 2022-11-19 (×32): qty 2

## 2022-11-19 MED ORDER — SEVELAMER CARBONATE 2.4 G PO PACK
4.8000 g | PACK | Freq: Three times a day (TID) | ORAL | Status: DC
  Administered 2022-11-20 – 2022-11-29 (×15): 4.8 g via ORAL
  Filled 2022-11-19 (×21): qty 2

## 2022-11-19 MED ORDER — OXYCODONE HCL 5 MG PO TABS
10.0000 mg | ORAL_TABLET | Freq: Four times a day (QID) | ORAL | Status: DC | PRN
  Administered 2022-11-19 – 2022-11-20 (×3): 10 mg via ORAL
  Filled 2022-11-19 (×3): qty 2

## 2022-11-19 NOTE — Progress Notes (Signed)
ANTICOAGULATION CONSULT NOTE - Follow Up Consult  Pharmacy Consult for warfarin>>argatroban Indication: atrial fibrillation  Allergies  Allergen Reactions   Ciprofloxacin Other (See Comments)    Aortic dissection   Heparin Other (See Comments)    UNSPECIFIED REACTION :  On Coumadin since 2004   HIT panel negative 01/19/17   Doxercalciferol Other (See Comments)   Quinolones Other (See Comments)    unknown    Patient Measurements: Height: 6\' 3"  (190.5 cm) Weight: 58.6 kg (129 lb 3 oz) IBW/kg (Calculated) : 84.5  Vital Signs: Temp: 98.1 F (36.7 C) (08/17 0732) Temp Source: Oral (08/17 0732) BP: 118/72 (08/17 0732) Pulse Rate: 71 (08/17 0732)  Labs: Recent Labs    11/17/22 0039 11/17/22 0039 11/17/22 0921 11/17/22 0937 11/18/22 0059 11/18/22 0844 11/19/22 0339  HGB 9.6*  --  10.9*  --  9.2*  --  8.5*  HCT 33.0*  --  32.0*  --  31.2*  --  28.6*  PLT 146*  --   --   --  189  --  155  APTT 98*  --   --   --  85* 87* 78*  LABPROT  --   --   --   --  32.8*  --  29.3*  INR  --   --   --   --  3.2*  --  2.7*  CREATININE  --    < > 5.80* 5.59* 6.32*  --  4.03*   < > = values in this interval not displayed.    Estimated Creatinine Clearance: 17.8 mL/min (A) (by C-G formula based on SCr of 4.03 mg/dL (H)).   Medications:  Scheduled:   amiodarone  200 mg Oral Daily   Chlorhexidine Gluconate Cloth  6 each Topical Q0600   darbepoetin (ARANESP) injection - DIALYSIS  40 mcg Subcutaneous Q Sat-1800   docusate sodium  100 mg Oral Daily   feeding supplement  237 mL Oral BID BM   gabapentin  100 mg Oral BID   hydrocerin   Topical Daily   lidocaine  3 patch Transdermal Q24H   midodrine  15 mg Oral TID WC   multivitamin  1 tablet Oral QHS   polyethylene glycol  17 g Oral BID   senna-docusate  1 tablet Oral BID   sevelamer carbonate  2,400 mg Oral TID WC    Assessment: 52 YOM with pleural effusion and PAD. S/p chest tube placement and removal on 8/9. Patient was on  warfarin PTA for history of afib which was resumed 8/9. Warfarin then held for AKA Of note, patient was on amiodarone PTA. Patient is not compliant with anticoagulation follow-ups, but last home regimen was warfarin 2 mg daily. As outpatient has last couple INRs have been 1.5 or less. Now s/p AKA 8/15. Pharmacy consulted to dose argatroban and warfarin.   aPTT 78 is therapeutic on 1 mcg/kg/min. No sign of bleeding. Per VVS, plan to restart oral anticoagulation in the next days if wound remains stable. OK to switch to apixaban when restarting oral. CBC stable.  Goal of Therapy:  aPTT 50-90 INR 2-3 Monitor platelets by anticoagulation protocol: Yes   Plan:  Continue Argatroban 45mcg/kg/min Daily aPTT, CBC and INR F/u switch from warfarin to apixaban    Fredonia Highland, PharmD, BCPS, [facility] Clinical Pharmacist [phone] Please check AMION for all [facility] Pharmacy numbers 11/19/2022

## 2022-11-19 NOTE — Progress Notes (Signed)
PROGRESS NOTE  Corey [name]:[id number] DOB: Mar 02, 1971   PCP: [name], NP  Patient is from: Home  DOA: 10/27/2022 LOS: 23  Chief complaints Chief Complaint  Patient presents with   Flank Pain   Shoulder Pain     Brief Narrative / Interim history: 52 year old M with PMH of ESRD on HD TTS, calciphylaxis, paroxysmal A-fib on warfarin, type B aortic dissection, PAD, chronic LE edema, severe malnutrition, blindness and nonambulatory status presenting to the hospital with SOB and several days of left shoulder, chest and flank pain.  As a result of pain, he missed 2 HD sessions.  Per EMS, he was saturating at 85% on RA.  CT chest/abdomen/pelvis showed moderate to large loculated left-sided pleural effusion with lower lobe consolidation.  He was admitted for acute respiratory failure with hypoxia due to left lobar pneumonia with parapneumonic effusion.  PCCM consulted, and had a chest tube placed on 7/31 after INR reversal with vitamin K.  He required multiple rounds of lytics.  Pleural fluid cultures NGTD.  Started on ceftriaxone and Zithromax on 8/1.  Transitioned to p.o. Augmentin on 8/9 that he will continue until 8/15.  Chest tube removed on 8/9.  Found to have left heel necrosis on 8/12.  Vascular surgery consulted and patient underwent left AKA on 8/15.  Anticoagulation resumed with p.o. Eliquis.   Therapy recommended CIR.  CIR following.  Medically stable for discharge.   Subjective: Seen and examined this afternoon. Complaining of "bad" pain at surgical site. No other issues  Objective: Vitals:   11/19/22 1142 11/19/22 1155 11/19/22 1227 11/19/22 1513  BP:  128/70 130/81 97/63  Pulse:  66  69  Resp:  18  20  Temp:  98 F (36.7 C) 97.6 F (36.4 C) 98.1 F (36.7 C)  TempSrc:   Oral Oral  SpO2:  100% 100% 100%  Weight: 54.5 kg     Height:        Examination:  GENERAL: Appears frail.  No apparent distress.  Nontoxic. HEENT: MMM.  Hearing grossly intact.   Legally blind. NECK: Supple.  No apparent JVD.  RESP:  No IWOB.  Fair aeration bilaterally. CVS:  RRR. Heart sounds normal.  ABD/GI/GU: BS+. Abd soft, NTND.  MSK/EXT: left AKA DCI. SKIN: Tree bark skin is in BLE as seen in picture below. NEURO: Awake, alert and oriented appropriately.  No apparent focal neuro deficit. PSYCH: Calm. Normal affect.    Procedures:  7/31-8/9-left chest tube 8/15-left AKA  Microbiology summarized: 7/26-MRSA PCR screen nonreactive 7/31-pleural fluid culture NGTD 7/31-pleural fluid AFB culture pending.  Smear negative. 7/31-pleural fluid fungal culture pending.  Assessment and plan: Principal Problem:   Acute respiratory failure with hypoxia (HCC) Active Problems:   ESRD on dialysis [facility])   Calciphylaxis   Essential hypertension   Chronic diastolic CHF (congestive heart failure) (HCC)   Atrial fibrillation, chronic (HCC)   End-stage renal disease on hemodialysis (HCC)   AAA (abdominal aortic aneurysm) without rupture (HCC)   Hyperkalemia   PVD (peripheral vascular disease) (HCC)   Constipation   Lobar pneumonia (HCC)   Parapneumonic effusion   Gangrene (HCC)   Protein-calorie malnutrition, severe  Acute respiratory failure with hypoxia due to left lower lobe pneumonia with parapneumonic effusion and fluid overload: TTE with LVEF of 50 to 55% and severely enlarged RV and RVSP of 34.4 mmHg.  Seems to have resolved.  Intermittently placed on 2 L by [place] although saturating in upper 90s. -Treated with dialysis, left  chest tube placement and antibiotics. -Chest tube removed 8/9. -CTX and Zithromax 8/1>>> p.o. Augmentin on 8/9-8/14. -Fluid management by dialysis -Wean oxygen, incentive spirometry, OOB, PT/OT   ESRD on HD TTS: Reportedly missed 2 HD sessions due to pain. -Dialysis per nephrology  Acute on chronic diastolic CHF/PAH: TTE TTE with LVEF of 50 to 55% and severely enlarged RV and RVSP of 34.4 mmHg (60 to 65%,  indeterminate DD and RVSP of  42.8 mmHg in 2020).  Currently appears euvolemic. -Fluid management by dialysis per nephrology.    Gangrene of left heel/peripheral artery disease -S/p left AKA by vascular surgery on 8/15. -Anticoagulation resumed with p.o. Eliquis -Scheduled Tylenol and increased oxycodone and Dilaudid for better pain control -Continue gabapentin 100 mg twice daily  Chronic pedal edema/ Venous stasis/calciphylaxis of lower extremity Hypertrophic toenails -Continue Eucerin and Sarna lotion on legs -Podiatry, Dr. Lilian Kapur recommended outpatient follow-up for hypertrophic toenail  Mass in left subclavicular region- Sebaceous cyst? According to the patient and family, this mass is new, however, according to the CT scan that was performed today, the mass has been present since 2021 and is unchanged   Paroxysmal atrial fibrillation: Rate controlled.  History of HIT. -Continue amiodarone -Anticoagulation resumed with p.o. Eliquis   Chronic hypotension: Normotensive.  TTE as above. -Continue midodrine  Anemia of chronic disease: H&H relatively stable. Recent Labs    11/09/22 0221 11/11/22 0008 11/12/22 0049 11/14/22 0038 11/15/22 0108 11/16/22 0722 11/17/22 0039 11/17/22 0921 11/18/22 0059 11/19/22 0339  HGB 9.8* 10.9* 10.7* 9.7* 9.7* 9.7* 9.6* 10.9* 9.2* 8.5*  -Continue monitoring -IV iron and Aranesp per nephrology  Type B thoracic aortic dissection with aneurysmal dilatation of the proximal descending thoracic aorta up to 4.5 cm in diameter. -Continue surveillance  Bedbound/wheelchair-bound/legally blind -PT/OT-recommended CIR.  Severe malnutrition Body mass index is 15.02 kg/m. Nutrition Problem: Severe Malnutrition Etiology: chronic illness (ESRD, CHF, PVD) Signs/Symptoms: severe fat depletion, severe muscle depletion, percent weight loss (14.6% weight loss in 3 months) Percent weight loss: 14.6 % Interventions: Ensure Enlive (each supplement provides 350kcal and 20 grams of  protein), Magic cup, MVI, Liberalize Diet, Education   DVT prophylaxis:  SCD's Start: 11/17/22 1239  Code Status: Full code Family Communication: None at bedside today. Level of care: Med-Surg Status is: Inpatient Remains inpatient appropriate because: Safe disposition/CIR   Final disposition: CIR Consultants:  Nephrology Vascular surgery Podiatry over the phone  35 minutes with more than 50% spent in reviewing records, counseling patient/family and coordinating care.   Sch Meds:  Scheduled Meds:  acetaminophen  650 mg Oral Q6H WA   amiodarone  200 mg Oral Daily   Chlorhexidine Gluconate Cloth  6 each Topical Q0600   darbepoetin (ARANESP) injection - DIALYSIS  40 mcg Subcutaneous Q Sat-1800   docusate sodium  100 mg Oral Daily   feeding supplement  237 mL Oral BID BM   gabapentin  100 mg Oral BID   hydrocerin   Topical Daily   lidocaine  3 patch Transdermal Q24H   midodrine  15 mg Oral TID WC   multivitamin  1 tablet Oral QHS   polyethylene glycol  17 g Oral BID   senna-docusate  1 tablet Oral BID   sevelamer carbonate  4.8 g Oral TID WC   Continuous Infusions:  anticoagulant sodium citrate 100 mL/hr at 11/18/22 1400   argatroban 1 mcg/kg/min (11/19/22 0625)   PRN Meds:.anticoagulant sodium citrate, calcium carbonate (dosed in mg elemental calcium), camphor-menthol **AND** hydrOXYzine, docusate sodium, HYDROmorphone (DILAUDID)  injection, ondansetron **OR** ondansetron (ZOFRAN) IV, oxyCODONE, phenol, sorbitol, zolpidem  Antimicrobials: Anti-infectives (From admission, onward)    Start     Dose/Rate Route Frequency Ordered Stop   11/17/22 0915  ceFAZolin (ANCEF) IVPB 2g/100 mL premix        2 g 200 mL/hr over 30 Minutes Intravenous  Once 11/17/22 0905 11/17/22 0934   11/17/22 0910  ceFAZolin (ANCEF) 2-4 GM/100ML-% IVPB       Note to Pharmacy: Samuella Cota O: cabinet override      11/17/22 0910 11/17/22 0945   11/11/22 2200  amoxicillin-clavulanate (AUGMENTIN)  500-125 MG per tablet 1 tablet        1 tablet Oral Daily at bedtime 11/11/22 1316 11/16/22 2202   11/08/22 1800  cefTRIAXone (ROCEPHIN) 2 g in sodium chloride 0.9 % 100 mL IVPB  Status:  Discontinued        2 g 200 mL/hr over 30 Minutes Intravenous Every 24 hours 11/08/22 1656 11/11/22 1316   11/03/22 0915  cefTRIAXone (ROCEPHIN) 2 g in sodium chloride 0.9 % 100 mL IVPB        2 g 200 mL/hr over 30 Minutes Intravenous Every 24 hours 11/03/22 0823 11/08/22 1701   11/03/22 0915  azithromycin (ZITHROMAX) 500 mg in sodium chloride 0.9 % 250 mL IVPB        500 mg 250 mL/hr over 60 Minutes Intravenous Every 24 hours 11/03/22 0823 11/08/22 1701        I have personally reviewed the following labs and images: CBC: Recent Labs  Lab 11/15/22 0108 11/16/22 0722 11/17/22 0039 11/17/22 0921 11/18/22 0059 11/19/22 0339  WBC 10.3 11.0* 9.3  --  12.0* 11.8*  HGB 9.7* 9.7* 9.6* 10.9* 9.2* 8.5*  HCT 33.3* 33.2* 33.0* 32.0* 31.2* 28.6*  MCV 95.7 95.1 94.8  --  96.6 92.9  PLT 133* 136* 146*  --  189 155   BMP &GFR Recent Labs  Lab 11/14/22 0038 11/15/22 0108 11/16/22 0722 11/17/22 0921 11/17/22 0937 11/18/22 0059 11/19/22 0339  NA 133* 136 135 138 138 137 135  K 3.9 4.0 4.0 4.3 4.3 4.6 3.8  CL 96* 98 97* 100 101 100 94*  CO2 27 25 25   --  26 24 28   GLUCOSE 101* 87 102* 86 86 142* 93  BUN 32* 43* 26* 33* 33* 42* 27*  CREATININE 5.28* 6.66* 4.38* 5.80* 5.59* 6.32* 4.03*  CALCIUM 8.3* 8.7* 8.8*  --  9.0 8.4* 8.7*  MG  --   --  2.0  --   --  2.0 1.9  PHOS 3.6  --  3.4  --   --  4.1 3.6   Estimated Creatinine Clearance: 16.5 mL/min (A) (by C-G formula based on SCr of 4.03 mg/dL (H)). Liver & Pancreas: Recent Labs  Lab 11/14/22 0038 11/16/22 0722 11/18/22 0059 11/19/22 0339  ALBUMIN 2.4* 2.5* 2.6* 2.5*   No results for input(s): "LIPASE", "AMYLASE" in the last 168 hours. No results for input(s): "AMMONIA" in the last 168 hours. Diabetic: No results for input(s): "HGBA1C" in  the last 72 hours. Recent Labs  Lab 11/17/22 1101 11/17/22 1126 11/17/22 1326  GLUCAP 68* 115* 112*   Cardiac Enzymes: No results for input(s): "CKTOTAL", "CKMB", "CKMBINDEX", "TROPONINI" in the last 168 hours. No results for input(s): "PROBNP" in the last 8760 hours. Coagulation Profile: Recent Labs  Lab 11/13/22 0059 11/14/22 0038 11/15/22 0108 11/18/22 0059 11/19/22 0339  INR 1.4* 1.4* 1.4* 3.2* 2.7*   Thyroid Function Tests: No  results for input(s): "TSH", "T4TOTAL", "FREET4", "T3FREE", "THYROIDAB" in the last 72 hours. Lipid Profile: No results for input(s): "CHOL", "HDL", "LDLCALC", "TRIG", "CHOLHDL", "LDLDIRECT" in the last 72 hours. Anemia Panel: No results for input(s): "VITAMINB12", "FOLATE", "FERRITIN", "TIBC", "IRON", "RETICCTPCT" in the last 72 hours. Urine analysis: No results found for: "COLORURINE", "APPEARANCEUR", "LABSPEC", "PHURINE", "GLUCOSEU", "HGBUR", "BILIRUBINUR", "KETONESUR", "PROTEINUR", "UROBILINOGEN", "NITRITE", "LEUKOCYTESUR" Sepsis Labs: Invalid input(s): "PROCALCITONIN", "LACTICIDVEN"  Microbiology: No results found for this or any previous visit (from the past 240 hour(s)).  Radiology Studies: No results found.    Toi Stelly T. Larwence Tu [facility]  If 7PM-7AM, please contact night-coverage [email or web address] 11/19/2022, 4:10 PM

## 2022-11-19 NOTE — Progress Notes (Signed)
PT Cancellation Note  Patient Details Name: Corey [name] MRN: [id number] DOB: 11-Aug-1970   Cancelled Treatment:    Reason Eval/Treat Not Completed: Patient at procedure or test/unavailable. Pt off the floor at HD. PT to return as able to progress mobility.  Corey [name], PT, DPT Acute Rehabilitation Services Secure chat preferred Office #: [phone]    Corey [name] 11/19/2022, 9:03 AM

## 2022-11-19 NOTE — Progress Notes (Signed)
OT Cancellation Note  Patient Details Name: KHAMONI DEUTSCHER MRN: [id number] DOB: [birth year]   Cancelled Treatment:    Reason Eval/Treat Not Completed: Patient at procedure or test/ unavailable (At HD will follow up as schedule allows.)  Tyler Deis, OTR/L [facility] Acute Rehabilitation Office: [phone]  Myrla Halsted 11/19/2022, 10:03 AM

## 2022-11-19 NOTE — Progress Notes (Signed)
   11/19/22 1155  Vitals  Temp 98 F (36.7 C)  Pulse Rate 66  Resp 18  BP 128/70  SpO2 100 %  O2 Device Nasal Cannula  Oxygen Therapy  O2 Flow Rate (L/min) 2 L/min  Patient Activity (if Appropriate) In bed  Pulse Oximetry Type Continuous  Oximetry Probe Site Changed No  Post Treatment  Dialyzer Clearance Lightly streaked  Hemodialysis Intake (mL) 0 mL  Liters Processed 60  Fluid Removed (mL) 500 mL  Tolerated HD Treatment Yes  During Treatment Monitoring  Duration of HD Treatment -hour(s) 2.5 hour(s)   Received patient in bed to unit.  Alert and oriented.  Informed consent signed and in chart.   TX duration:2.5hr  Patient tolerated well.  Transported back to the room  Alert, without acute distress.  Hand-off given to patient's nurse.   Access used: [facility]  Access issues: none  Total UF removed: Medication(s) given: none    [facility]

## 2022-11-19 NOTE — Progress Notes (Signed)
Dr Arlean Hopping rounded changed HD 2.5hrs due to HD yesterday

## 2022-11-19 NOTE — Progress Notes (Signed)
  Progress Note    11/19/2022 3:26 PM 2 Days Post-Op  Subjective:  does not want to see aka   Vitals:   11/19/22 1227 11/19/22 1513  BP: 130/81 97/63  Pulse:  69  Resp:  20  Temp: 97.6 F (36.4 C) 98.1 F (36.7 C)  SpO2: 100% 100%    Physical Exam: Aaox3 Left aka site healing well  CBC    Component Value Date/Time   WBC 11.8 (H) 11/19/2022 0339   RBC 3.08 (L) 11/19/2022 0339   HGB 8.5 (L) 11/19/2022 0339   HGB 11.4 (L) 08/31/2022 1137   HCT 28.6 (L) 11/19/2022 0339   HCT 34.7 (L) 08/31/2022 1137   PLT 155 11/19/2022 0339   PLT 135 (L) 08/31/2022 1137   MCV 92.9 11/19/2022 0339   MCV 93 08/31/2022 1137   MCH 27.6 11/19/2022 0339   MCHC 29.7 (L) 11/19/2022 0339   RDW 16.0 (H) 11/19/2022 0339   RDW 13.4 08/31/2022 1137   LYMPHSABS 0.8 11/07/2022 0048   LYMPHSABS 1.0 02/13/2017 1425   MONOABS 1.1 (H) 11/07/2022 0048   EOSABS 0.1 11/07/2022 0048   EOSABS 0.1 02/13/2017 1425   BASOSABS 0.0 11/07/2022 0048   BASOSABS 0.0 02/13/2017 1425    BMET    Component Value Date/Time   NA 135 11/19/2022 0339   NA 143 08/31/2022 1137   K 3.8 11/19/2022 0339   CL 94 (L) 11/19/2022 0339   CO2 28 11/19/2022 0339   GLUCOSE 93 11/19/2022 0339   BUN 27 (H) 11/19/2022 0339   BUN 57 (H) 08/31/2022 1137   CREATININE 4.03 (H) 11/19/2022 0339   CALCIUM 8.7 (L) 11/19/2022 0339   GFRNONAA 17 (L) 11/19/2022 0339   GFRAA 5 (L) 09/04/2019 1114    INR    Component Value Date/Time   INR 2.7 (H) 11/19/2022 0339     Intake/Output Summary (Last 24 hours) at 11/19/2022 1526 Last data filed at 11/19/2022 1155 Gross per 24 hour  Intake 5.82 ml  Output 500 ml  Net -494.18 ml     Assessment:  52 y.o. male is s/p left aka for necrotic left heal  Plan: Will revisit this Monday  Ok for CIR from vascular standpoint  Will be high risk in the future for R AKA, protect right heal and leg when in bed and with transfers   [place] C. Randie Heinz, MD [facility]  [place] Office: [phone] Pager: [phone]  11/19/2022 3:26 PM

## 2022-11-19 NOTE — Progress Notes (Signed)
[facility] NEPHROLOGY PROGRESS NOTE  Subjective:  Some pain after surgery. Otherwise no complaints.  Objective Vital signs in last 24 hours: Vitals:   11/19/22 1130 11/19/22 1133 11/19/22 1142 11/19/22 1155  BP:  (!) 113/90  128/70  Pulse: 66 67  66  Resp: 12 16  18   Temp:  98 F (36.7 C)  98 F (36.7 C)  TempSrc:  Oral    SpO2: 100% 99%  100%  Weight:   54.5 kg   Height:       Physical Exam: General:NAD, comfortable Heart:normal rate, no rub Lungs:clear b/l, no crackle Abdomen:soft, Non-tender, non-distended Extremities:No edema, left AKA Dialysis Access: TDC     OP HD: TTS [facility] 4h  2/2 bath  66kg   TDC   no heparin (allergy)  lock TDC w/ citrate - No ESA or VDRA - Binder: Renvela pwdr 4.8g TID - Sensipar 180mg  every day at home    Assessment/ Plan # Acute hypoxic respiratory failure: In setting of missed HD, pna w/ effusion. CXR with pleural effusion s/p chest tube 7/31 now removed. Resolved.  # PNA/ L parapneumonic pleural effusion: s/p thoracentesis with cultures. Got IV abx course and finished with augmentin  #Left leg wound - s/p AKA 8/15  # ESRD: maintain TTS dialysis schedule. HD today. Shortened to 3hrs, no UF.   #Heparin allergy - use citrate for cath lock.  #Hypotension/ volume: BP low, LE edema resolved.  Is on midodrine. Will need lower EDW after discharge.  #Anemia of ESRD:  Hgb 9-10s - s/p Aranesp on 7/27.  CTM.  # Secondary HPTH: Ca/Phos ok - switched to sevelamer tabs at 2400 tid per pharm recs.  Held cinacalcet due to hypoCa but corrects near normal when accounting for albumin. Consider restarting  #A-fib: On amiodarone + warfarin, has IVC filter  # H/o type B aortic dissection s/p repair (2004)      Corey Moselle  MD  [facility] 11/19/2022, 12:13 PM  Recent Labs  Lab 11/18/22 0059 11/19/22 0339  HGB 9.2* 8.5*  ALBUMIN 2.6* 2.5*  CALCIUM 8.4* 8.7*  PHOS 4.1 3.6  CREATININE 6.32* 4.03*  K 4.6 3.8    Inpatient  medications:  amiodarone  200 mg Oral Daily   Chlorhexidine Gluconate Cloth  6 each Topical Q0600   darbepoetin (ARANESP) injection - DIALYSIS  40 mcg Subcutaneous Q Sat-1800   docusate sodium  100 mg Oral Daily   feeding supplement  237 mL Oral BID BM   gabapentin  100 mg Oral BID   hydrocerin   Topical Daily   lidocaine  3 patch Transdermal Q24H   midodrine  15 mg Oral TID WC   multivitamin  1 tablet Oral QHS   polyethylene glycol  17 g Oral BID   senna-docusate  1 tablet Oral BID   sevelamer carbonate  2,400 mg Oral TID WC    anticoagulant sodium citrate 100 mL/hr at 11/18/22 1400   argatroban 1 mcg/kg/min (11/19/22 0625)   acetaminophen, anticoagulant sodium citrate, calcium carbonate (dosed in mg elemental calcium), camphor-menthol **AND** hydrOXYzine, docusate sodium, HYDROmorphone (DILAUDID) injection, ondansetron **OR** ondansetron (ZOFRAN) IV, oxyCODONE, phenol, sorbitol, zolpidem

## 2022-11-20 DIAGNOSIS — J181 Lobar pneumonia, unspecified organism: Secondary | ICD-10-CM | POA: Diagnosis not present

## 2022-11-20 DIAGNOSIS — N186 End stage renal disease: Secondary | ICD-10-CM | POA: Diagnosis not present

## 2022-11-20 DIAGNOSIS — I5032 Chronic diastolic (congestive) heart failure: Secondary | ICD-10-CM | POA: Diagnosis not present

## 2022-11-20 DIAGNOSIS — J9601 Acute respiratory failure with hypoxia: Secondary | ICD-10-CM | POA: Diagnosis not present

## 2022-11-20 LAB — CBC
HCT: 28.3 % — ABNORMAL LOW (ref 39.0–52.0)
Hemoglobin: 8.3 g/dL — ABNORMAL LOW (ref 13.0–17.0)
MCH: 27.6 pg (ref 26.0–34.0)
MCHC: 29.3 g/dL — ABNORMAL LOW (ref 30.0–36.0)
MCV: 94 fL (ref 80.0–100.0)
Platelets: 149 10*3/uL — ABNORMAL LOW (ref 150–400)
RBC: 3.01 MIL/uL — ABNORMAL LOW (ref 4.22–5.81)
RDW: 16 % — ABNORMAL HIGH (ref 11.5–15.5)
WBC: 11 10*3/uL — ABNORMAL HIGH (ref 4.0–10.5)
nRBC: 0 % (ref 0.0–0.2)

## 2022-11-20 LAB — APTT: aPTT: 89 s — ABNORMAL HIGH (ref 24–36)

## 2022-11-20 MED ORDER — BOOST / RESOURCE BREEZE PO LIQD CUSTOM
1.0000 | Freq: Three times a day (TID) | ORAL | Status: DC
  Administered 2022-11-20: 1 via ORAL

## 2022-11-20 MED ORDER — APIXABAN 2.5 MG PO TABS
2.5000 mg | ORAL_TABLET | Freq: Two times a day (BID) | ORAL | Status: DC
  Administered 2022-11-20 – 2022-11-29 (×19): 2.5 mg via ORAL
  Filled 2022-11-20 (×19): qty 1

## 2022-11-20 MED ORDER — CINACALCET HCL 30 MG PO TABS
90.0000 mg | ORAL_TABLET | Freq: Every day | ORAL | Status: DC
  Administered 2022-11-20 – 2022-11-27 (×8): 90 mg via ORAL
  Filled 2022-11-20 (×8): qty 3

## 2022-11-20 NOTE — Progress Notes (Signed)
[facility] NEPHROLOGY PROGRESS NOTE  Subjective:  Seen in room, no c/o's.   Objective Vital signs in last 24 hours: Vitals:   11/19/22 1130 11/19/22 1133 11/19/22 1142 11/19/22 1155  BP:  (!) 113/90  128/70  Pulse: 66 67  66  Resp: 12 16  18   Temp:  98 F (36.7 C)  98 F (36.7 C)  TempSrc:  Oral    SpO2: 100% 99%  100%  Weight:   54.5 kg   Height:       Physical Exam: General:NAD, comfortable Heart:normal rate, no rub Lungs:clear b/l, no crackle Abdomen:soft, Non-tender, non-distended Extremities:No edema, left AKA Dialysis Access: TDC     OP HD: TTS [facility] 4h  2/2 bath  66kg   TDC   no heparin (allergy)  lock TDC w/ citrate - No ESA or VDRA - Binder: Renvela pwdr 4.8g TID - Sensipar 180mg  every day at home    Assessment/ Plan # Acute hypoxic respiratory failure: In setting of missed HD, pna w/ effusion. CXR with pleural effusion s/p chest tube 7/31 now removed. Resolved.  # PNA/ L parapneumonic pleural effusion: s/p thoracentesis with cultures. Got IV abx course and finished with augmentin  #Left leg wound - s/p AKA 8/15  # ESRD: maintain TTS dialysis schedule. HD today. Shortened to 3hrs, no UF.   #Heparin allergy - use citrate for cath lock.  #Hypotension/ volume: LE edema resolved.  Is on midodrine. 11 kg under dry wt, but BP's are soft. May be dry.  Keep even next 1-2 HD sessions.   #Anemia of ESRD:  Hgb 9-10s - s/p Aranesp on 7/27.  CTM.  # Secondary HPTH: Ca/Phos ok - switched to sevelamer tabs at 2400 tid per pharm recs.  Held cinacalcet due to hypoCa but corrects near normal when accounting for albumin. Will resume sensipar at 1/2 dose 90mg  daily.   #A-fib: On amiodarone + warfarin, has IVC filter  # H/o type B aortic dissection s/p repair (2004)   # dispo - awaiting rehab facility or CIR   Rob Arlean Hopping  MD  [facility] 11/20/2022, 12:10 PM  Recent Labs  Lab 11/18/22 0059 11/19/22 0339 11/20/22 0047  HGB 9.2* 8.5* 8.3*  ALBUMIN  2.6* 2.5*  --   CALCIUM 8.4* 8.7*  --   PHOS 4.1 3.6  --   CREATININE 6.32* 4.03*  --   K 4.6 3.8  --     Inpatient medications:  acetaminophen  650 mg Oral Q6H WA   amiodarone  200 mg Oral Daily   apixaban  2.5 mg Oral BID   Chlorhexidine Gluconate Cloth  6 each Topical Q0600   darbepoetin (ARANESP) injection - DIALYSIS  40 mcg Subcutaneous Q Sat-1800   docusate sodium  100 mg Oral Daily   feeding supplement  237 mL Oral BID BM   gabapentin  100 mg Oral BID   hydrocerin   Topical Daily   lidocaine  3 patch Transdermal Q24H   midodrine  15 mg Oral TID WC   multivitamin  1 tablet Oral QHS   polyethylene glycol  17 g Oral BID   senna-docusate  1 tablet Oral BID   sevelamer carbonate  4.8 g Oral TID WC    anticoagulant sodium citrate 100 mL/hr at 11/18/22 1400   anticoagulant sodium citrate, calcium carbonate (dosed in mg elemental calcium), camphor-menthol **AND** hydrOXYzine, docusate sodium, HYDROmorphone (DILAUDID) injection, ondansetron **OR** ondansetron (ZOFRAN) IV, oxyCODONE, phenol, sorbitol, zolpidem

## 2022-11-20 NOTE — Evaluation (Signed)
Physical Therapy Re-valuation Patient Details Name: Corey [name] MRN: [id number] DOB: [birth year] Today's Date: 11/20/2022  History of Present Illness  Pt is a 52 y.o. male admitted 10/27/22 with SOB, L shoulder and chest pain missing 2x HD sessions. CT showed mod-large loculated L pleural effusion with lower lobe consolidation. S/p chest tube placement 7/31; removed 8/9. Found to have L heel necrosis 8/12. S/p L AKA 8/15. PMH includes ESRD (HD TTS), PAF on Warfarin, type B aortic dissection, PAD, chronic LE edema, severe malnutrition, blindness.   Clinical Impression  Pt seen for PT Re-Evaluation now s/p L AKA on 11/17/22. PTA, pt mod indep at wheelchair-level, lives with brother who assists with ADL/iADLs as needed. Initiated post-op amputation education re: precautions, positioning, activity recommendations, importance of AROM and mobility. Today, pt requiring minA for mobility, tolerating prolonged sitting EOB activity with intermittent minA for balance, though quick to fatigue; pt declines OOB transfer despite max encouragement from PT and visiting family members; pt reports, "I'll be able to next session." Will plan to initiate wheelchair transfer training next session as pt agreeable. Continue to recommend post-acute rehab services (>3 hrs/day) to maximize functional mobility and independence prior to return home. Will follow acutely to address established goals.  Supine BP 96/60 (71) Sitting BP 81/52 (62) Post-activity BP 105/64 (77)      If plan is discharge home, recommend the following: A little help with walking and/or transfers;A little help with bathing/dressing/bathroom;Assistance with cooking/housework;Assist for transportation;Help with stairs or ramp for entrance   Can travel by private vehicle        Equipment Recommendations Other (comment) (TBD)  Recommendations for Other Services       Functional Status Assessment Patient has had a recent decline in their functional  status and demonstrates the ability to make significant improvements in function in a reasonable and predictable amount of time.     Precautions / Restrictions Precautions Precautions: Fall;Other (comment) Precaution Comments: s/p L AKA 11/17/22; watch soft BP Restrictions Weight Bearing Restrictions: Yes LLE Weight Bearing: Non weight bearing      Mobility  Bed Mobility Overal bed mobility: Needs Assistance Bed Mobility: Supine to Sit, Sit to Supine Rolling: Supervision   Supine to sit: Min assist, HOB elevated, Used rails Sit to supine: Supervision, Used rails   General bed mobility comments: increased time and effort, reliant on bed rail use, minA for HHA to elevate trunk    Transfers                   General transfer comment: pt declined    Ambulation/Gait                  Stairs            Wheelchair Mobility     Tilt Bed    Modified Rankin (Stroke Patients Only)       Balance Overall balance assessment: Needs assistance Sitting-balance support: Feet supported, No upper extremity supported Sitting balance-Leahy Scale: Fair Sitting balance - Comments: quick to fatigue and become reliant on BUE support, requiring cues to maintain trunk/cervical extension as pt tends to lean on R elbow                                     Pertinent Vitals/Pain Pain Assessment Pain Assessment: Faces Faces Pain Scale: Hurts little more Pain Location: L residual limb; phantom limb pain L foot "  itching" Pain Descriptors / Indicators: Discomfort, Guarding, Grimacing Pain Intervention(s): Monitored during session, Limited activity within patient's tolerance    Home Living Family/patient expects to be discharged to:: Private residence Living Arrangements: Other relatives (brother) Available Help at Discharge: Family;Available 24 hours/day Type of Home: House Home Access: Ramped entrance       Home Layout: One level Home Equipment:  BSC/3in1;Wheelchair - manual      Prior Function Prior Level of Function : Needs assist             Mobility Comments: mod indep w/c level mobility ADLs Comments: sponges bathes at baseline; brother assists with higher-level ADLs and iADLs as needed     Extremity/Trunk Assessment   Upper Extremity Assessment Upper Extremity Assessment: Generalized weakness;Left hand dominant    Lower Extremity Assessment Lower Extremity Assessment: RLE deficits/detail;LLE deficits/detail RLE Deficits / Details: h/o R knee flexion and hip ER contracture; hip and knee >/ 3/5; notable lower leg skin changes, flakiness LLE Deficits / Details: s/p L AKA 11/17/22; pt with hip flexor contracture, unable to achieve neutral extension with bed flat; gross hip strength >/ 3/5 with expected post-op pain and weakness    Cervical / Trunk Assessment Cervical / Trunk Assessment: Kyphotic  Communication   Communication Communication: No apparent difficulties  Cognition Arousal: Alert Behavior During Therapy: Flat affect Overall Cognitive Status: Within Functional Limits for tasks assessed                                 General Comments: WFL for simple tasks; brothers present which seemed to help motivation and interaction        General Comments General comments (skin integrity, edema, etc.): pt's brother and close friend present and supportive, good motivating factor for pt. BP down to 81/52 (62) with sitting, pt reports dizziness no worse than 'normal', up to 105/64 (77) with activity. increased time on post-op amputee education, including precautions, positioning (importance of not resting with L hip flexed), AROM, phantom limb pain, importance of OOB mobility. per RN, pt looked at his residual limb for the first time yesterday (2 days post-op)    Exercises     Assessment/Plan    PT Assessment Patient needs continued PT services  PT Problem List Decreased strength;Decreased range of  motion;Decreased activity tolerance;Decreased balance;Decreased mobility;Decreased knowledge of use of DME;Decreased safety awareness;Decreased knowledge of precautions;Pain;Decreased skin integrity;Cardiopulmonary status limiting activity       PT Treatment Interventions DME instruction;Functional mobility training;Therapeutic activities;Therapeutic exercise;Balance training;Patient/family education;Wheelchair mobility training    PT Goals (Current goals can be found in the Care Plan section)  Acute Rehab PT Goals Patient Stated Goal: post-acute rehab at [facility], regain independence PT Goal Formulation: With patient Time For Goal Achievement: 12/04/22 Potential to Achieve Goals: Good    Frequency Min 1X/week     Co-evaluation               AM-PAC PT "6 Clicks" Mobility  Outcome Measure Help needed turning from your back to your side while in a flat bed without using bedrails?: A Little Help needed moving from lying on your back to sitting on the side of a flat bed without using bedrails?: A Little Help needed moving to and from a bed to a chair (including a wheelchair)?: A Little Help needed standing up from a chair using your arms (e.g., wheelchair or bedside chair)?: Total Help needed to walk in hospital room?: Total  Help needed climbing 3-5 steps with a railing? : Total 6 Click Score: 12    End of Session   Activity Tolerance: Patient tolerated treatment well;Patient limited by fatigue;Patient limited by pain Patient left: in bed;with call bell/phone within reach;with family/visitor present Nurse Communication: Mobility status PT Visit Diagnosis: Muscle weakness (generalized) (M62.81);Pain    Time: 1335-1355 PT Time Calculation (min) (ACUTE ONLY): 20 min   Charges:   PT Evaluation $PT Re-evaluation: 1 Re-eval   PT General Charges $$ ACUTE PT VISIT: 1 Visit       Ina Homes, PT, DPT Acute Rehabilitation Services  Personal: Secure Chat Rehab Office:  [phone]  Malachy Chamber 11/20/2022, 4:01 PM

## 2022-11-20 NOTE — Progress Notes (Signed)
Physical Therapy Treatment Patient Details Name: Corey [name] MRN: [id number] DOB: 06-14-1970 Today's Date: 11/20/2022   History of Present Illness Pt is a 52 y.o. male admitted 10/27/22 with SOB, L shoulder and chest pain missing 2x HD sessions. CT showed mod-large loculated L pleural effusion with lower lobe consolidation. S/p chest tube placement 7/31; removed 8/9. Found to have L heel necrosis 8/12. S/p L AKA 8/15. PMH includes ESRD (HD TTS), PAF on Warfarin, type B aortic dissection, PAD, chronic LE edema, severe malnutrition, blindness.   PT Comments  Pt seen for additional session, focused on bed-level ADL task and LE therex (HEP provided). Increased time discussing importance of activity progression, especially if pt pursuing more intensive post-acute rehab services; pt reports understanding and agreeable to w/c training next session. Pt's brother present and supportive. Will continue to follow acutely to address established goals.     If plan is discharge home, recommend the following: A little help with walking and/or transfers;A little help with bathing/dressing/bathroom;Assistance with cooking/housework;Assist for transportation;Help with stairs or ramp for entrance   Can travel by private vehicle        Equipment Recommendations  Other (comment) (TBD)    Recommendations for Other Services       Precautions / Restrictions Precautions Precautions: Fall;Other (comment) Precaution Comments: s/p L AKA 11/17/22; watch soft BP Restrictions Weight Bearing Restrictions: Yes LLE Weight Bearing: Non weight bearing     Mobility  Bed Mobility Overal bed mobility: Needs Assistance Bed Mobility: Rolling Rolling: Supervision   Supine to sit: Min assist, HOB elevated, Used rails Sit to supine: Supervision, Used rails   General bed mobility comments: heavy reliance on bed rail use to roll R/L for bed pan placement; pt tolerating >15 min laying flat without issue to encourage neutral  hip extension    Transfers                   General transfer comment: pt declined    Ambulation/Gait                   Stairs             Wheelchair Mobility     Tilt Bed    Modified Rankin (Stroke Patients Only)       Balance Overall balance assessment: Needs assistance Sitting-balance support: Feet supported, No upper extremity supported Sitting balance-Leahy Scale: Fair Sitting balance - Comments: quick to fatigue and become reliant on BUE support, requiring cues to maintain trunk/cervical extension as pt tends to lean on R elbow                                    Cognition Arousal: Alert Behavior During Therapy: Flat affect Overall Cognitive Status: Within Functional Limits for tasks assessed                                 General Comments: WFL for simple tasks; brothers present which seemed to help motivation and interaction        Exercises Other Exercises Other Exercises: [employer] HEP handout provided - single leg bridge (on RLE) holds, sidelying L hip circles, supine isometric L hip extension holds    General Comments General comments (skin integrity, edema, etc.): pt seen for additional session for bed-level ADLs (using bed pain) requiring assist to complete perineal care while  sidelying. further post-op amputee education completed, including AROM/therex; HEP provided (pt unable to see handout, but given to brother; verbally educ pt on exercises and pt able to demonstrate correct technique)      Pertinent Vitals/Pain Pain Assessment Pain Assessment: Faces Faces Pain Scale: Hurts little more Pain Location: buttocks with bed pan Pain Descriptors / Indicators: Discomfort, Grimacing Pain Intervention(s): Monitored during session, Repositioned    Home Living Family/patient expects to be discharged to:: Private residence Living Arrangements: Other relatives (brother) Available Help at Discharge:  Family;Available 24 hours/day Type of Home: House Home Access: Ramped entrance       Home Layout: One level Home Equipment: BSC/3in1;Wheelchair - manual      Prior Function            PT Goals (current goals can now be found in the care plan section) Acute Rehab PT Goals Patient Stated Goal: post-acute rehab at [facility], regain independence PT Goal Formulation: With patient Time For Goal Achievement: 12/04/22 Potential to Achieve Goals: Good Progress towards PT goals: Progressing toward goals    Frequency    Min 1X/week      PT Plan      Co-evaluation              AM-PAC PT "6 Clicks" Mobility   Outcome Measure  Help needed turning from your back to your side while in a flat bed without using bedrails?: A Little Help needed moving from lying on your back to sitting on the side of a flat bed without using bedrails?: A Little Help needed moving to and from a bed to a chair (including a wheelchair)?: A Little Help needed standing up from a chair using your arms (e.g., wheelchair or bedside chair)?: Total Help needed to walk in hospital room?: Total Help needed climbing 3-5 steps with a railing? : Total 6 Click Score: 12    End of Session   Activity Tolerance: Patient tolerated treatment well;Patient limited by fatigue Patient left: in bed;with call bell/phone within reach;with family/visitor present Nurse Communication: Mobility status PT Visit Diagnosis: Muscle weakness (generalized) (M62.81);Pain     Time: 0102-7253 PT Time Calculation (min) (ACUTE ONLY): 20 min  Charges:    $Self Care/Home Management: 8-22 PT General Charges $$ ACUTE PT VISIT: 1 Visit                     Ina Homes, PT, DPT Acute Rehabilitation Services  Personal: Secure Chat Rehab Office: [phone]  Malachy Chamber 11/20/2022, 4:11 PM

## 2022-11-20 NOTE — Progress Notes (Signed)
PROGRESS NOTE  Corey [name]:[id number] DOB: 1970/08/24   PCP: [name], NP  Patient is from: Home  DOA: 10/27/2022 LOS: 24  Chief complaints Chief Complaint  Patient presents with   Flank Pain   Shoulder Pain     Brief Narrative / Interim history: 52 year old M with PMH of ESRD on HD TTS, calciphylaxis, paroxysmal A-fib on warfarin, type B aortic dissection, PAD, chronic LE edema, severe malnutrition, blindness and nonambulatory status presenting to the hospital with SOB and several days of left shoulder, chest and flank pain.  As a result of pain, he missed 2 HD sessions.  Per EMS, he was saturating at 85% on RA.  CT chest/abdomen/pelvis showed moderate to large loculated left-sided pleural effusion with lower lobe consolidation.  He was admitted for acute respiratory failure with hypoxia due to left lobar pneumonia with parapneumonic effusion.  PCCM consulted, and had a chest tube placed on 7/31 after INR reversal with vitamin K.  He required multiple rounds of lytics.  Pleural fluid cultures NGTD.  Started on ceftriaxone and Zithromax on 8/1.  Transitioned to p.o. Augmentin on 8/9 that he will continue until 8/15.  Chest tube removed on 8/9.  Found to have left heel necrosis on 8/12.  Vascular surgery consulted and patient underwent left AKA on 8/15.  Anticoagulation resumed with p.o. Eliquis.   Therapy recommended CIR.  CIR following.  Medically stable for discharge.   Subjective: Seen and examined this afternoon.  Continues to report significant pain despite up titration of pain medication.  Rates his pain 8/10.  Worse with movement.  Objective: Vitals:   11/20/22 0430 11/20/22 0636 11/20/22 0755 11/20/22 1034  BP:   96/60   Pulse: 69 74 70   Resp: (!) 21 17 13    Temp:   98.3 F (36.8 C) 97.8 F (36.6 C)  TempSrc:   Oral Oral  SpO2: 99% 98% 100% 90%  Weight:      Height:        Examination:  GENERAL: Appears frail.  No apparent distress.   Nontoxic. HEENT: MMM.  Hearing grossly intact.  Legally blind. NECK: Supple.  No apparent JVD.  RESP:  No IWOB.  Fair aeration bilaterally. CVS:  RRR. Heart sounds normal.  ABD/GI/GU: BS+. Abd soft, NTND.  MSK/EXT: left AKA DCI.  Staples in place. SKIN: Tree bark skin is in BLE as seen in picture below. NEURO: Awake, alert and oriented appropriately.  No apparent focal neuro deficit. PSYCH: Calm. Normal affect.    Procedures:  7/31-8/9-left chest tube 8/15-left AKA  Microbiology summarized: 7/26-MRSA PCR screen nonreactive 7/31-pleural fluid culture NGTD 7/31-pleural fluid AFB culture pending.  Smear negative. 7/31-pleural fluid fungal culture pending.  Assessment and plan: Principal Problem:   Acute respiratory failure with hypoxia (HCC) Active Problems:   ESRD on dialysis [facility])   Calciphylaxis   Essential hypertension   Chronic diastolic CHF (congestive heart failure) (HCC)   Atrial fibrillation, chronic (HCC)   End-stage renal disease on hemodialysis (HCC)   AAA (abdominal aortic aneurysm) without rupture (HCC)   Hyperkalemia   PVD (peripheral vascular disease) (HCC)   Constipation   Lobar pneumonia (HCC)   Parapneumonic effusion   Gangrene (HCC)   Protein-calorie malnutrition, severe  Acute respiratory failure with hypoxia due to left lower lobe pneumonia with parapneumonic effusion and fluid overload: TTE with LVEF of 50 to 55% and severely enlarged RV and RVSP of 34.4 mmHg.  Seems to have resolved.  Intermittently placed on 2  L by [place] although saturating in upper 90s. -Treated with dialysis, left chest tube placement and antibiotics. -Chest tube removed 8/9. -CTX and Zithromax 8/1>>> p.o. Augmentin on 8/9-8/14. -Fluid management by dialysis -Wean oxygen, incentive spirometry, OOB, PT/OT   ESRD on HD TTS: Reportedly missed 2 HD sessions due to pain. -Dialysis per nephrology  Acute on chronic diastolic CHF/PAH: TTE TTE with LVEF of 50 to 55% and severely enlarged  RV and RVSP of 34.4 mmHg (60 to 65%,  indeterminate DD and RVSP of 42.8 mmHg in 2020).  Currently appears euvolemic. -Fluid management by dialysis per nephrology.    Gangrene of left heel/peripheral artery disease -S/p left AKA by vascular surgery on 8/15. -Anticoagulation resumed with p.o. Eliquis -Scheduled Tylenol and increased oxycodone and Dilaudid for better pain control on 8/17. -Continue gabapentin 100 mg twice daily  Chronic pedal edema/ Venous stasis/calciphylaxis of lower extremity Hypertrophic toenails -Continue Eucerin and Sarna lotion on legs -Podiatry, Dr. Lilian Kapur recommended outpatient follow-up for hypertrophic toenail  Mass in left subclavicular region- Sebaceous cyst? According to the patient and family, this mass is new, however, according to the CT scan that was performed today, the mass has been present since 2021 and is unchanged   Paroxysmal atrial fibrillation: Rate controlled.  History of HIT. -Continue amiodarone -Anticoagulation resumed with p.o. Eliquis   Chronic hypotension: Normotensive.  TTE as above. -Continue midodrine  Anemia of chronic disease: H&H relatively stable. Recent Labs    11/11/22 0008 11/12/22 0049 11/14/22 0038 11/15/22 0108 11/16/22 0722 11/17/22 0039 11/17/22 0921 11/18/22 0059 11/19/22 0339 11/20/22 0047  HGB 10.9* 10.7* 9.7* 9.7* 9.7* 9.6* 10.9* 9.2* 8.5* 8.3*  -Continue monitoring -IV iron and Aranesp per nephrology  Type B thoracic aortic dissection with aneurysmal dilatation of the proximal descending thoracic aorta up to 4.5 cm in diameter. -Continue surveillance  Bedbound/wheelchair-bound/legally blind -PT/OT-recommended CIR.  Severe malnutrition Body mass index is 15.02 kg/m. Nutrition Problem: Severe Malnutrition Etiology: chronic illness (ESRD, CHF, PVD) Signs/Symptoms: severe fat depletion, severe muscle depletion, percent weight loss (14.6% weight loss in 3 months) Percent weight loss: 14.6  % Interventions: Ensure Enlive (each supplement provides 350kcal and 20 grams of protein), Magic cup, MVI, Liberalize Diet, Education   DVT prophylaxis:  apixaban (ELIQUIS) tablet 2.5 mg Start: 11/20/22 1000 SCD's Start: 11/17/22 1239 apixaban (ELIQUIS) tablet 2.5 mg  Code Status: Full code Family Communication: None at bedside today. Level of care: Med-Surg Status is: Inpatient Remains inpatient appropriate because: Safe disposition/CIR   Final disposition: CIR Consultants:  Nephrology Vascular surgery Podiatry over the phone  35 minutes with more than 50% spent in reviewing records, counseling patient/family and coordinating care.   Sch Meds:  Scheduled Meds:  acetaminophen  650 mg Oral Q6H WA   amiodarone  200 mg Oral Daily   apixaban  2.5 mg Oral BID   Chlorhexidine Gluconate Cloth  6 each Topical Q0600   cinacalcet  90 mg Oral Q supper   darbepoetin (ARANESP) injection - DIALYSIS  40 mcg Subcutaneous Q Sat-1800   docusate sodium  100 mg Oral Daily   feeding supplement  1 Container Oral TID BM   gabapentin  100 mg Oral BID   hydrocerin   Topical Daily   lidocaine  3 patch Transdermal Q24H   midodrine  15 mg Oral TID WC   multivitamin  1 tablet Oral QHS   polyethylene glycol  17 g Oral BID   senna-docusate  1 tablet Oral BID   sevelamer carbonate  4.8  g Oral TID WC   Continuous Infusions:  anticoagulant sodium citrate 100 mL/hr at 11/18/22 1400   PRN Meds:.anticoagulant sodium citrate, calcium carbonate (dosed in mg elemental calcium), camphor-menthol **AND** hydrOXYzine, docusate sodium, HYDROmorphone (DILAUDID) injection, ondansetron **OR** ondansetron (ZOFRAN) IV, oxyCODONE, phenol, sorbitol, zolpidem  Antimicrobials: Anti-infectives (From admission, onward)    Start     Dose/Rate Route Frequency Ordered Stop   11/17/22 0915  ceFAZolin (ANCEF) IVPB 2g/100 mL premix        2 g 200 mL/hr over 30 Minutes Intravenous  Once 11/17/22 0905 11/17/22 0934   11/17/22  0910  ceFAZolin (ANCEF) 2-4 GM/100ML-% IVPB       Note to Pharmacy: Samuella Cota O: cabinet override      11/17/22 0910 11/17/22 0945   11/11/22 2200  amoxicillin-clavulanate (AUGMENTIN) 500-125 MG per tablet 1 tablet        1 tablet Oral Daily at bedtime 11/11/22 1316 11/16/22 2202   11/08/22 1800  cefTRIAXone (ROCEPHIN) 2 g in sodium chloride 0.9 % 100 mL IVPB  Status:  Discontinued        2 g 200 mL/hr over 30 Minutes Intravenous Every 24 hours 11/08/22 1656 11/11/22 1316   11/03/22 0915  cefTRIAXone (ROCEPHIN) 2 g in sodium chloride 0.9 % 100 mL IVPB        2 g 200 mL/hr over 30 Minutes Intravenous Every 24 hours 11/03/22 0823 11/08/22 1701   11/03/22 0915  azithromycin (ZITHROMAX) 500 mg in sodium chloride 0.9 % 250 mL IVPB        500 mg 250 mL/hr over 60 Minutes Intravenous Every 24 hours 11/03/22 0823 11/08/22 1701        I have personally reviewed the following labs and images: CBC: Recent Labs  Lab 11/16/22 0722 11/17/22 0039 11/17/22 0921 11/18/22 0059 11/19/22 0339 11/20/22 0047  WBC 11.0* 9.3  --  12.0* 11.8* 11.0*  HGB 9.7* 9.6* 10.9* 9.2* 8.5* 8.3*  HCT 33.2* 33.0* 32.0* 31.2* 28.6* 28.3*  MCV 95.1 94.8  --  96.6 92.9 94.0  PLT 136* 146*  --  189 155 149*   BMP &GFR Recent Labs  Lab 11/14/22 0038 11/15/22 0108 11/16/22 0722 11/17/22 0921 11/17/22 0937 11/18/22 0059 11/19/22 0339  NA 133* 136 135 138 138 137 135  K 3.9 4.0 4.0 4.3 4.3 4.6 3.8  CL 96* 98 97* 100 101 100 94*  CO2 27 25 25   --  26 24 28   GLUCOSE 101* 87 102* 86 86 142* 93  BUN 32* 43* 26* 33* 33* 42* 27*  CREATININE 5.28* 6.66* 4.38* 5.80* 5.59* 6.32* 4.03*  CALCIUM 8.3* 8.7* 8.8*  --  9.0 8.4* 8.7*  MG  --   --  2.0  --   --  2.0 1.9  PHOS 3.6  --  3.4  --   --  4.1 3.6   Estimated Creatinine Clearance: 16.5 mL/min (A) (by C-G formula based on SCr of 4.03 mg/dL (H)). Liver & Pancreas: Recent Labs  Lab 11/14/22 0038 11/16/22 0722 11/18/22 0059 11/19/22 0339  ALBUMIN 2.4*  2.5* 2.6* 2.5*   No results for input(s): "LIPASE", "AMYLASE" in the last 168 hours. No results for input(s): "AMMONIA" in the last 168 hours. Diabetic: No results for input(s): "HGBA1C" in the last 72 hours. Recent Labs  Lab 11/17/22 1101 11/17/22 1126 11/17/22 1326  GLUCAP 68* 115* 112*   Cardiac Enzymes: No results for input(s): "CKTOTAL", "CKMB", "CKMBINDEX", "TROPONINI" in the last 168 hours. No results  for input(s): "PROBNP" in the last 8760 hours. Coagulation Profile: Recent Labs  Lab 11/14/22 0038 11/15/22 0108 11/18/22 0059 11/19/22 0339  INR 1.4* 1.4* 3.2* 2.7*   Thyroid Function Tests: No results for input(s): "TSH", "T4TOTAL", "FREET4", "T3FREE", "THYROIDAB" in the last 72 hours. Lipid Profile: No results for input(s): "CHOL", "HDL", "LDLCALC", "TRIG", "CHOLHDL", "LDLDIRECT" in the last 72 hours. Anemia Panel: No results for input(s): "VITAMINB12", "FOLATE", "FERRITIN", "TIBC", "IRON", "RETICCTPCT" in the last 72 hours. Urine analysis: No results found for: "COLORURINE", "APPEARANCEUR", "LABSPEC", "PHURINE", "GLUCOSEU", "HGBUR", "BILIRUBINUR", "KETONESUR", "PROTEINUR", "UROBILINOGEN", "NITRITE", "LEUKOCYTESUR" Sepsis Labs: Invalid input(s): "PROCALCITONIN", "LACTICIDVEN"  Microbiology: No results found for this or any previous visit (from the past 240 hour(s)).  Radiology Studies: No results found.    Keola Heninger T. Lashandra Arauz [facility]  If 7PM-7AM, please contact night-coverage [email or web address] 11/20/2022, 1:27 PM

## 2022-11-21 DIAGNOSIS — J9601 Acute respiratory failure with hypoxia: Secondary | ICD-10-CM | POA: Diagnosis not present

## 2022-11-21 DIAGNOSIS — N186 End stage renal disease: Secondary | ICD-10-CM | POA: Diagnosis not present

## 2022-11-21 DIAGNOSIS — J181 Lobar pneumonia, unspecified organism: Secondary | ICD-10-CM | POA: Diagnosis not present

## 2022-11-21 DIAGNOSIS — I5032 Chronic diastolic (congestive) heart failure: Secondary | ICD-10-CM | POA: Diagnosis not present

## 2022-11-21 MED ORDER — OXYCODONE HCL 5 MG PO TABS
5.0000 mg | ORAL_TABLET | Freq: Four times a day (QID) | ORAL | Status: DC | PRN
  Administered 2022-11-22 – 2022-11-25 (×6): 5 mg via ORAL
  Filled 2022-11-21 (×6): qty 1

## 2022-11-21 MED ORDER — CHLORHEXIDINE GLUCONATE CLOTH 2 % EX PADS: 6.0000 | MEDICATED_PAD | Freq: Every day | CUTANEOUS | Status: DC

## 2022-11-21 MED ORDER — HYDROMORPHONE HCL 1 MG/ML IJ SOLN
0.5000 mg | INTRAMUSCULAR | Status: DC | PRN
  Administered 2022-11-24 – 2022-11-25 (×4): 0.5 mg via INTRAVENOUS
  Filled 2022-11-21 (×4): qty 0.5

## 2022-11-21 NOTE — Progress Notes (Signed)
PROGRESS NOTE  Corey [name] SAY:[id number] DOB: [birth year]   PCP: [name], NP  Patient is from: Home  DOA: 10/27/2022 LOS: 25  Chief complaints Chief Complaint  Patient presents with   Flank Pain   Shoulder Pain     Brief Narrative / Interim history: 52 year old M with PMH of ESRD on HD TTS, calciphylaxis, paroxysmal A-fib on warfarin, type B aortic dissection, PAD, chronic LE edema, severe malnutrition, blindness and nonambulatory status presenting to the hospital with SOB and several days of left shoulder, chest and flank pain.  As a result of pain, he missed 2 HD sessions.  Per EMS, he was saturating at 85% on RA.  CT chest/abdomen/pelvis showed moderate to large loculated left-sided pleural effusion with lower lobe consolidation.  He was admitted for acute respiratory failure with hypoxia due to left lobar pneumonia with parapneumonic effusion.  PCCM consulted, and had a chest tube placed on 7/31 after INR reversal with vitamin K.  He required multiple rounds of lytics.  Pleural fluid cultures NGTD.  Started on ceftriaxone and Zithromax on 8/1.  Transitioned to p.o. Augmentin on 8/9 that he will continue until 8/15.  Chest tube removed on 8/9.  Found to have left heel necrosis on 8/12.  Vascular surgery consulted and patient underwent left AKA on 8/15.  Anticoagulation resumed with p.o. Eliquis.   Therapy recommended CIR.  CIR following.  Medically stable for discharge.   Subjective: Seen and examined this afternoon.  Continues to endorse significant pain although he does not appear to be in that much distress.  Hypotensive overnight and this morning.  Not symptomatic.  Discussed the importance of cutting down on pain medication given his blood pressure.  Voiced understanding.  Objective: Vitals:   11/20/22 2044 11/20/22 2339 11/21/22 0410 11/21/22 0749  BP: 97/65 (!) 96/59 (!) 87/58 113/84  Pulse:   69 74  Resp: 16 16 20 20   Temp:  98.2 F (36.8 C) 98.5 F (36.9 C)  97.9 F (36.6 C)  TempSrc:  Oral Oral Oral  SpO2:  92% 100% 96%  Weight:      Height:        Examination:  GENERAL: Appears frail.  No apparent distress.  Nontoxic. HEENT: MMM.  Hearing grossly intact.  Legally blind. NECK: Supple.  No apparent JVD.  RESP:  No IWOB.  Fair aeration bilaterally. CVS:  RRR. Heart sounds normal.  ABD/GI/GU: BS+. Abd soft, NTND.  MSK/EXT: left AKA DCI.  Staples in place. SKIN: Tree bark skin is in BLE as seen in picture below. NEURO: Awake, alert and oriented appropriately.  No apparent focal neuro deficit. PSYCH: Calm. Normal affect.    Procedures:  7/31-8/9-left chest tube 8/15-left AKA  Microbiology summarized: 7/26-MRSA PCR screen nonreactive 7/31-pleural fluid culture NGTD 7/31-pleural fluid AFB culture pending.  Smear negative. 7/31-pleural fluid fungal culture pending.  Assessment and plan: Principal Problem:   Acute respiratory failure with hypoxia (HCC) Active Problems:   ESRD on dialysis [facility])   Calciphylaxis   Essential hypertension   Chronic diastolic CHF (congestive heart failure) (HCC)   Atrial fibrillation, chronic (HCC)   End-stage renal disease on hemodialysis (HCC)   AAA (abdominal aortic aneurysm) without rupture (HCC)   Hyperkalemia   PVD (peripheral vascular disease) (HCC)   Constipation   Lobar pneumonia (HCC)   Parapneumonic effusion   Gangrene (HCC)   Protein-calorie malnutrition, severe  Acute respiratory failure with hypoxia due to left lower lobe pneumonia with parapneumonic effusion and fluid overload:  TTE with LVEF of 50 to 55% and severely enlarged RV and RVSP of 34.4 mmHg.  Seems to have resolved.  Intermittently placed on 2 L by [place] although saturating in upper 90s. -Treated with dialysis, left chest tube placement and antibiotics. -Chest tube removed 8/9. -CTX and Zithromax 8/1>>> p.o. Augmentin on 8/9-8/14. -Fluid management by dialysis -Wean oxygen, incentive spirometry, OOB, PT/OT   ESRD on HD  TTS: Reportedly missed 2 HD sessions due to pain. -Dialysis per nephrology  Acute on chronic diastolic CHF/PAH: TTE TTE with LVEF of 50 to 55% and severely enlarged RV and RVSP of 34.4 mmHg (60 to 65%,  indeterminate DD and RVSP of 42.8 mmHg in 2020).  Currently appears euvolemic. -Fluid management by dialysis per nephrology.    Gangrene of left heel/peripheral artery disease -S/p left AKA by vascular surgery on 8/15. -Anticoagulation resumed with p.o. Eliquis -Scheduled Tylenol  -Decreased oxycodone and Dilaudid given his soft blood pressures/hypotension -Continue gabapentin 100 mg twice daily  Chronic pedal edema/ Venous stasis/calciphylaxis of lower extremity Hypertrophic toenails -Continue Eucerin and Sarna lotion on legs -Podiatry, Dr. Lilian Kapur recommended outpatient follow-up for hypertrophic toenail  Mass in left subclavicular region- Sebaceous cyst? According to the patient and family, this mass is new, however, according to the CT scan that was performed today, the mass has been present since 2021 and is unchanged   Paroxysmal atrial fibrillation: Rate controlled.  History of HIT. -Continue amiodarone -Anticoagulation resumed with p.o. Eliquis   Chronic hypotension:  TTE as above.  On midodrine. -Continue midodrine -Decreased opiate.  Anemia of chronic disease: H&H relatively stable. Recent Labs    11/11/22 0008 11/12/22 0049 11/14/22 0038 11/15/22 0108 11/16/22 0722 11/17/22 0039 11/17/22 0921 11/18/22 0059 11/19/22 0339 11/20/22 0047  HGB 10.9* 10.7* 9.7* 9.7* 9.7* 9.6* 10.9* 9.2* 8.5* 8.3*  -Continue monitoring -IV iron and Aranesp per nephrology  Type B thoracic aortic dissection with aneurysmal dilatation of the proximal descending thoracic aorta up to 4.5 cm in diameter. -Continue surveillance  Bedbound/wheelchair-bound/legally blind -PT/OT-recommended CIR.  Severe malnutrition Body mass index is 15.02 kg/m. Nutrition Problem: Severe  Malnutrition Etiology: chronic illness (ESRD, CHF, PVD) Signs/Symptoms: severe fat depletion, severe muscle depletion, percent weight loss (14.6% weight loss in 3 months) Percent weight loss: 14.6 % Interventions: Ensure Enlive (each supplement provides 350kcal and 20 grams of protein), Magic cup, MVI, Liberalize Diet, Education   DVT prophylaxis:  apixaban (ELIQUIS) tablet 2.5 mg Start: 11/20/22 1000 SCD's Start: 11/17/22 1239 apixaban (ELIQUIS) tablet 2.5 mg  Code Status: Full code Family Communication: None at bedside today. Level of care: Med-Surg Status is: Inpatient Remains inpatient appropriate because: Safe disposition/CIR   Final disposition: CIR Consultants:  Nephrology Vascular surgery Podiatry over the phone  35 minutes with more than 50% spent in reviewing records, counseling patient/family and coordinating care.   Sch Meds:  Scheduled Meds:  acetaminophen  650 mg Oral Q6H WA   amiodarone  200 mg Oral Daily   apixaban  2.5 mg Oral BID   Chlorhexidine Gluconate Cloth  6 each Topical Q0600   [START ON 11/22/2022] Chlorhexidine Gluconate Cloth  6 each Topical Q0600   cinacalcet  90 mg Oral Q supper   darbepoetin (ARANESP) injection - DIALYSIS  40 mcg Subcutaneous Q Sat-1800   docusate sodium  100 mg Oral Daily   feeding supplement  1 Container Oral TID BM   gabapentin  100 mg Oral BID   hydrocerin   Topical Daily   lidocaine  3 patch  Transdermal Q24H   midodrine  15 mg Oral TID WC   multivitamin  1 tablet Oral QHS   polyethylene glycol  17 g Oral BID   senna-docusate  1 tablet Oral BID   sevelamer carbonate  4.8 g Oral TID WC   Continuous Infusions:  anticoagulant sodium citrate 100 mL/hr at 11/18/22 1400   PRN Meds:.anticoagulant sodium citrate, calcium carbonate (dosed in mg elemental calcium), camphor-menthol **AND** hydrOXYzine, docusate sodium, HYDROmorphone (DILAUDID) injection, ondansetron **OR** ondansetron (ZOFRAN) IV, oxyCODONE, phenol, sorbitol,  zolpidem  Antimicrobials: Anti-infectives (From admission, onward)    Start     Dose/Rate Route Frequency Ordered Stop   11/17/22 0915  ceFAZolin (ANCEF) IVPB 2g/100 mL premix        2 g 200 mL/hr over 30 Minutes Intravenous  Once 11/17/22 0905 11/17/22 0934   11/17/22 0910  ceFAZolin (ANCEF) 2-4 GM/100ML-% IVPB       Note to Pharmacy: Samuella Cota O: cabinet override      11/17/22 0910 11/17/22 0945   11/11/22 2200  amoxicillin-clavulanate (AUGMENTIN) 500-125 MG per tablet 1 tablet        1 tablet Oral Daily at bedtime 11/11/22 1316 11/16/22 2202   11/08/22 1800  cefTRIAXone (ROCEPHIN) 2 g in sodium chloride 0.9 % 100 mL IVPB  Status:  Discontinued        2 g 200 mL/hr over 30 Minutes Intravenous Every 24 hours 11/08/22 1656 11/11/22 1316   11/03/22 0915  cefTRIAXone (ROCEPHIN) 2 g in sodium chloride 0.9 % 100 mL IVPB        2 g 200 mL/hr over 30 Minutes Intravenous Every 24 hours 11/03/22 0823 11/08/22 1701   11/03/22 0915  azithromycin (ZITHROMAX) 500 mg in sodium chloride 0.9 % 250 mL IVPB        500 mg 250 mL/hr over 60 Minutes Intravenous Every 24 hours 11/03/22 0823 11/08/22 1701        I have personally reviewed the following labs and images: CBC: Recent Labs  Lab 11/16/22 0722 11/17/22 0039 11/17/22 0921 11/18/22 0059 11/19/22 0339 11/20/22 0047  WBC 11.0* 9.3  --  12.0* 11.8* 11.0*  HGB 9.7* 9.6* 10.9* 9.2* 8.5* 8.3*  HCT 33.2* 33.0* 32.0* 31.2* 28.6* 28.3*  MCV 95.1 94.8  --  96.6 92.9 94.0  PLT 136* 146*  --  189 155 149*   BMP &GFR Recent Labs  Lab 11/15/22 0108 11/16/22 0722 11/17/22 0921 11/17/22 0937 11/18/22 0059 11/19/22 0339  NA 136 135 138 138 137 135  K 4.0 4.0 4.3 4.3 4.6 3.8  CL 98 97* 100 101 100 94*  CO2 25 25  --  26 24 28   GLUCOSE 87 102* 86 86 142* 93  BUN 43* 26* 33* 33* 42* 27*  CREATININE 6.66* 4.38* 5.80* 5.59* 6.32* 4.03*  CALCIUM 8.7* 8.8*  --  9.0 8.4* 8.7*  MG  --  2.0  --   --  2.0 1.9  PHOS  --  3.4  --   --  4.1  3.6   Estimated Creatinine Clearance: 16.5 mL/min (A) (by C-G formula based on SCr of 4.03 mg/dL (H)). Liver & Pancreas: Recent Labs  Lab 11/16/22 0722 11/18/22 0059 11/19/22 0339  ALBUMIN 2.5* 2.6* 2.5*   No results for input(s): "LIPASE", "AMYLASE" in the last 168 hours. No results for input(s): "AMMONIA" in the last 168 hours. Diabetic: No results for input(s): "HGBA1C" in the last 72 hours. Recent Labs  Lab 11/17/22 1101 11/17/22 1126 11/17/22  1326  GLUCAP 68* 115* 112*   Cardiac Enzymes: No results for input(s): "CKTOTAL", "CKMB", "CKMBINDEX", "TROPONINI" in the last 168 hours. No results for input(s): "PROBNP" in the last 8760 hours. Coagulation Profile: Recent Labs  Lab 11/15/22 0108 11/18/22 0059 11/19/22 0339  INR 1.4* 3.2* 2.7*   Thyroid Function Tests: No results for input(s): "TSH", "T4TOTAL", "FREET4", "T3FREE", "THYROIDAB" in the last 72 hours. Lipid Profile: No results for input(s): "CHOL", "HDL", "LDLCALC", "TRIG", "CHOLHDL", "LDLDIRECT" in the last 72 hours. Anemia Panel: No results for input(s): "VITAMINB12", "FOLATE", "FERRITIN", "TIBC", "IRON", "RETICCTPCT" in the last 72 hours. Urine analysis: No results found for: "COLORURINE", "APPEARANCEUR", "LABSPEC", "PHURINE", "GLUCOSEU", "HGBUR", "BILIRUBINUR", "KETONESUR", "PROTEINUR", "UROBILINOGEN", "NITRITE", "LEUKOCYTESUR" Sepsis Labs: Invalid input(s): "PROCALCITONIN", "LACTICIDVEN"  Microbiology: No results found for this or any previous visit (from the past 240 hour(s)).  Radiology Studies: No results found.    Areesha Dehaven T. Ireoluwa Grant [facility]  If 7PM-7AM, please contact night-coverage [email or web address] 11/21/2022, 11:47 AM

## 2022-11-21 NOTE — Progress Notes (Signed)
Right leg cleansed with soap and water, dried with towel with black flaky skin came off in small amount. Eucerin cream applied.

## 2022-11-21 NOTE — Plan of Care (Signed)

## 2022-11-21 NOTE — Discharge Instructions (Signed)

## 2022-11-21 NOTE — Progress Notes (Signed)
[facility] NEPHROLOGY PROGRESS NOTE  Subjective:  Seen in room, no c/o's.   Objective Vital signs in last 24 hours: Vitals:   11/19/22 1130 11/19/22 1133 11/19/22 1142 11/19/22 1155  BP:  (!) 113/90  128/70  Pulse: 66 67  66  Resp: 12 16  18   Temp:  98 F (36.7 C)  98 F (36.7 C)  TempSrc:  Oral    SpO2: 100% 99%  100%  Weight:   54.5 kg   Height:       Physical Exam: General:NAD, comfortable Heart:normal rate, no rub Lungs:clear b/l, no crackle Abdomen:soft, Non-tender, non-distended Extremities:No edema, left AKA Dialysis Access: TDC     OP HD: TTS [facility] 4h  2/2 bath  66kg   TDC   no heparin (allergy)  lock TDC w/ citrate - No ESA or VDRA - Binder: Renvela pwdr 4.8g TID - Sensipar 180mg  every day at home    Assessment/ Plan # Acute hypoxic respiratory failure: In setting of missed HD, pna w/ effusion. CXR with pleural effusion s/p chest tube 7/31 now removed. Resolved.  # PNA/ L parapneumonic pleural effusion: s/p thoracentesis with cultures. Got IV abx course and finished with augmentin  #Left leg wound - s/p AKA 8/15  # ESRD: maintain TTS dialysis schedule. Time shortened to 3hrs. HD tomorrow.   #Heparin allergy - use citrate for cath lock.  #Hypotension/ volume: LE edema resolved.  Is on midodrine. 11 kg under dry wt, but BP's are soft. May be dry.  Keep even next 1-2 HD sessions.   #Anemia of ESRD:  Hgb 9-10s - s/p Aranesp on 7/27.  CTM.  # Secondary HPTH: Ca/Phos ok - switched to sevelamer tabs at 2400 tid per pharm recs.  Held cinacalcet due to hypoCa but corrects near normal when accounting for albumin. Will resume sensipar at 1/2 dose 90mg  daily.   #A-fib: On amiodarone + warfarin, has IVC filter  # H/o type B aortic dissection s/p repair (2004)   # dispo - awaiting rehab facility or CIR   Rob Arlean Hopping  MD  [facility] 11/21/2022, 11:24 AM  Recent Labs  Lab 11/18/22 0059 11/19/22 0339 11/20/22 0047  HGB 9.2* 8.5* 8.3*   ALBUMIN 2.6* 2.5*  --   CALCIUM 8.4* 8.7*  --   PHOS 4.1 3.6  --   CREATININE 6.32* 4.03*  --   K 4.6 3.8  --     Inpatient medications:  acetaminophen  650 mg Oral Q6H WA   amiodarone  200 mg Oral Daily   apixaban  2.5 mg Oral BID   Chlorhexidine Gluconate Cloth  6 each Topical Q0600   cinacalcet  90 mg Oral Q supper   darbepoetin (ARANESP) injection - DIALYSIS  40 mcg Subcutaneous Q Sat-1800   docusate sodium  100 mg Oral Daily   feeding supplement  1 Container Oral TID BM   gabapentin  100 mg Oral BID   hydrocerin   Topical Daily   lidocaine  3 patch Transdermal Q24H   midodrine  15 mg Oral TID WC   multivitamin  1 tablet Oral QHS   polyethylene glycol  17 g Oral BID   senna-docusate  1 tablet Oral BID   sevelamer carbonate  4.8 g Oral TID WC    anticoagulant sodium citrate 100 mL/hr at 11/18/22 1400   anticoagulant sodium citrate, calcium carbonate (dosed in mg elemental calcium), camphor-menthol **AND** hydrOXYzine, docusate sodium, HYDROmorphone (DILAUDID) injection, ondansetron **OR** ondansetron (ZOFRAN) IV, oxyCODONE, phenol,  sorbitol, zolpidem

## 2022-11-21 NOTE — Progress Notes (Signed)
Inpatient Rehab Admissions Coordinator:   CIR Following. Pt. Is currently not a level to tolerate the intensity of CIR. I will follow for progress and participation with therapies.    Megan Salon, MS, CCC-SLP Rehab Admissions Coordinator  [phone] (celll) [phone] (office)

## 2022-11-21 NOTE — Progress Notes (Signed)
Occupational Therapy Treatment Patient Details Name: Corey [name] MRN: [id number] DOB: 07-22-1970 Today's Date: 11/21/2022   History of present illness Pt is a 52 y.o. male admitted 10/27/22 with SOB, L shoulder and chest pain missing 2x HD sessions. CT showed mod-large loculated L pleural effusion with lower lobe consolidation. S/p chest tube placement 7/31; removed 8/9. Found to have L heel necrosis 8/12. S/p L AKA 8/15. PMH includes ESRD (HD TTS), PAF on Warfarin, type B aortic dissection, PAD, chronic LE edema, severe malnutrition, blindness.   OT comments  Pt limited by dizziness/soft BP this session, able to perform bed mobility with supervision-minA, sits EOB x3 min and reports feeling very dizzy, BP soft. Pt returned to supine and able to perform BUE exercises with yellow theraband. Pt presenting with impairments listed below, will follow acutely. Patient will benefit from intensive inpatient follow up therapy, >3 hours/day to maximize safety/ind with ADLs/functional mobility.  BP supine 95/63 (73) BP seated EOB 75/52 (60) BP returning to supine 80/55 (63)        If plan is discharge home, recommend the following:  A little help with walking and/or transfers;A little help with bathing/dressing/bathroom;Assistance with cooking/housework;Direct supervision/assist for medications management;Direct supervision/assist for financial management;Assist for transportation;Help with stairs or ramp for entrance   Equipment Recommendations  Other (comment) (defer to next venue of care)    Recommendations for Other Services Rehab consult    Precautions / Restrictions Precautions Precautions: Fall;Other (comment) Precaution Comments: s/p L AKA 11/17/22; watch soft BP Restrictions Weight Bearing Restrictions: Yes LLE Weight Bearing: Non weight bearing Other Position/Activity Restrictions: offload R heel       Mobility Bed Mobility Overal bed mobility: Needs Assistance Bed Mobility:  Rolling   Sidelying to sit: Min assist   Sit to supine: Supervision   General bed mobility comments: returned to supine due to dizziness and soft BP    Transfers                         Balance Overall balance assessment: Needs assistance Sitting-balance support: Feet supported, No upper extremity supported Sitting balance-Leahy Scale: Fair                                     ADL either performed or assessed with clinical judgement   ADL Overall ADL's : Needs assistance/impaired     Grooming: Set up;Bed level                               Functional mobility during ADLs: Minimal assistance      Extremity/Trunk Assessment Upper Extremity Assessment Upper Extremity Assessment: Generalized weakness   Lower Extremity Assessment Lower Extremity Assessment: Defer to PT evaluation        Vision   Vision Assessment?: No apparent visual deficits Additional Comments: legally blind at baseline   Perception Perception Perception: Not tested   Praxis Praxis Praxis: Not tested    Cognition Arousal: Alert Behavior During Therapy: Flat affect Overall Cognitive Status: Within Functional Limits for tasks assessed                                 General Comments: WFL for simple tasks; brothers present which seemed to help motivation and interaction  Exercises Other Exercises Other Exercises: functional reach with yellow theraband    Shoulder Instructions       General Comments BP soft see note    Pertinent Vitals/ Pain       Pain Assessment Pain Assessment: Faces Pain Score: 4  Faces Pain Scale: Hurts little more Pain Location: LLE Pain Descriptors / Indicators: Discomfort, Grimacing Pain Intervention(s): Limited activity within patient's tolerance, Monitored during session, Repositioned  Home Living                                          Prior Functioning/Environment               Frequency  Min 2X/week        Progress Toward Goals  OT Goals(current goals can now be found in the care plan section)  Progress towards OT goals: Progressing toward goals  Acute Rehab OT Goals Patient Stated Goal: none stated OT Goal Formulation: With patient Time For Goal Achievement: 11/25/22 Potential to Achieve Goals: Good ADL Goals Pt Will Perform Lower Body Bathing: with contact guard assist;sitting/lateral leans Pt Will Perform Lower Body Dressing: with contact guard assist;sitting/lateral leans Pt Will Transfer to Toilet: with supervision;bedside commode;squat pivot transfer Pt Will Perform Toileting - Clothing Manipulation and hygiene: with supervision;sitting/lateral leans Pt/caregiver will Perform Home Exercise Program: Increased strength;Both right and left upper extremity;With theraband;Independently;With written HEP provided  Plan      Co-evaluation                 AM-PAC OT "6 Clicks" Daily Activity     Outcome Measure   Help from another person eating meals?: None Help from another person taking care of personal grooming?: A Little Help from another person toileting, which includes using toliet, bedpan, or urinal?: A Little Help from another person bathing (including washing, rinsing, drying)?: A Lot Help from another person to put on and taking off regular upper body clothing?: A Little Help from another person to put on and taking off regular lower body clothing?: A Lot 6 Click Score: 17    End of Session Equipment Utilized During Treatment: Oxygen  OT Visit Diagnosis: Unsteadiness on feet (R26.81);Other abnormalities of gait and mobility (R26.89);Muscle weakness (generalized) (M62.81);Low vision, both eyes (H54.2);Pain;Adult, failure to thrive (R62.7)   Activity Tolerance Treatment limited secondary to medical complications (Comment) (BP)   Patient Left in bed;with call bell/phone within reach;with bed alarm set   Nurse Communication  Mobility status        Time: 4098-1191 OT Time Calculation (min): 27 min  Charges: OT General Charges $OT Visit: 1 Visit OT Treatments $Therapeutic Activity: 23-37 mins  Carver Fila, OTD, OTR/L SecureChat Preferred Acute Rehab [phone]   Carver Fila Koonce 11/21/2022, 2:37 PM

## 2022-11-21 NOTE — Progress Notes (Signed)
Cardiac monitor d/ced as ordered since 11/18/22. CMMD made aware.

## 2022-11-21 NOTE — Progress Notes (Addendum)
  Progress Note    11/21/2022 7:55 AM 4 Days Post-Op  Subjective:  no complaints   Vitals:   11/21/22 0410 11/21/22 0749  BP: (!) 87/58 113/84  Pulse: 69 74  Resp: 20 20  Temp: 98.5 F (36.9 C) 97.9 F (36.6 C)  SpO2: 100% 96%   Physical Exam: Lungs:  non labored Incisions:  L AKA c/d/i Neurologic: A&O  CBC    Component Value Date/Time   WBC 11.0 (H) 11/20/2022 0047   RBC 3.01 (L) 11/20/2022 0047   HGB 8.3 (L) 11/20/2022 0047   HGB 11.4 (L) 08/31/2022 1137   HCT 28.3 (L) 11/20/2022 0047   HCT 34.7 (L) 08/31/2022 1137   PLT 149 (L) 11/20/2022 0047   PLT 135 (L) 08/31/2022 1137   MCV 94.0 11/20/2022 0047   MCV 93 08/31/2022 1137   MCH 27.6 11/20/2022 0047   MCHC 29.3 (L) 11/20/2022 0047   RDW 16.0 (H) 11/20/2022 0047   RDW 13.4 08/31/2022 1137   LYMPHSABS 0.8 11/07/2022 0048   LYMPHSABS 1.0 02/13/2017 1425   MONOABS 1.1 (H) 11/07/2022 0048   EOSABS 0.1 11/07/2022 0048   EOSABS 0.1 02/13/2017 1425   BASOSABS 0.0 11/07/2022 0048   BASOSABS 0.0 02/13/2017 1425    BMET    Component Value Date/Time   NA 135 11/19/2022 0339   NA 143 08/31/2022 1137   K 3.8 11/19/2022 0339   CL 94 (L) 11/19/2022 0339   CO2 28 11/19/2022 0339   GLUCOSE 93 11/19/2022 0339   BUN 27 (H) 11/19/2022 0339   BUN 57 (H) 08/31/2022 1137   CREATININE 4.03 (H) 11/19/2022 0339   CALCIUM 8.7 (L) 11/19/2022 0339   GFRNONAA 17 (L) 11/19/2022 0339   GFRAA 5 (L) 09/04/2019 1114    INR    Component Value Date/Time   INR 2.7 (H) 11/19/2022 0339     Intake/Output Summary (Last 24 hours) at 11/21/2022 0755 Last data filed at 11/21/2022 0750 Gross per 24 hour  Intake 820 ml  Output --  Net 820 ml     Assessment/Plan:  52 y.o. male is s/p L AKA 4 Days Post-Op   L AKA incision is well appearing Continue dry dressing to L AKA as needed Stump sock ordered Office will arrange staple removal in 4-6 weeks Offload R heel    Emilie Rutter, PA-C [facility] [phone] 11/21/2022 7:55 AM  I agree with the above.  I have seen and evaluated the patient.  He does have some residual phantom pain.  His incisional pain is improving.  If his phantom pain continues, his gabapentin could be increased.  [name]

## 2022-11-22 DIAGNOSIS — I5032 Chronic diastolic (congestive) heart failure: Secondary | ICD-10-CM | POA: Diagnosis not present

## 2022-11-22 DIAGNOSIS — J181 Lobar pneumonia, unspecified organism: Secondary | ICD-10-CM | POA: Diagnosis not present

## 2022-11-22 DIAGNOSIS — J9601 Acute respiratory failure with hypoxia: Secondary | ICD-10-CM | POA: Diagnosis not present

## 2022-11-22 DIAGNOSIS — N186 End stage renal disease: Secondary | ICD-10-CM | POA: Diagnosis not present

## 2022-11-22 MED ORDER — ALBUMIN HUMAN 25 % IV SOLN: 12.5000 g | Freq: Once | INTRAVENOUS | Status: DC

## 2022-11-22 MED ORDER — ENSURE ENLIVE PO LIQD
237.0000 mL | Freq: Two times a day (BID) | ORAL | Status: DC
  Administered 2022-11-22 – 2022-11-29 (×6): 237 mL via ORAL

## 2022-11-22 MED ORDER — ANTICOAGULANT SODIUM CITRATE 4% (200MG/5ML) IV SOLN: 5.0000 mL | Status: DC | PRN

## 2022-11-22 MED ORDER — ANTICOAGULANT SODIUM CITRATE 4% (200MG/5ML) IV SOLN
5.0000 mL | Status: DC | PRN
  Administered 2022-11-22: 5 mL
  Filled 2022-11-22: qty 5

## 2022-11-22 MED ORDER — ALBUMIN HUMAN 25 % IV SOLN
25.0000 g | Freq: Once | INTRAVENOUS | Status: AC
  Administered 2022-11-22: 25 g via INTRAVENOUS
  Filled 2022-11-22: qty 100

## 2022-11-22 NOTE — Progress Notes (Signed)
Received patient in bed.Awake,alert and oriented x 4.Consent verified.  Medicine given : Albumin 25 g.  Access used : Right Hd catheter,that worked well.Dressing changed done today.  Duration of treatment: 3.5 hours.  Fluid removed : Even.  Hemo comment: Low blood pressure at the start of treatment,thus Albumin 25 g  given. Tolerated rest of the treatment.  Hand off to the patient's nurse.

## 2022-11-22 NOTE — Progress Notes (Signed)
[facility] NEPHROLOGY PROGRESS NOTE  Subjective:  Seen in room, no c/o's.   Objective Vital signs in last 24 hours: Vitals:   11/19/22 1130 11/19/22 1133 11/19/22 1142 11/19/22 1155  BP:  (!) 113/90  128/70  Pulse: 66 67  66  Resp: 12 16  18   Temp:  98 F (36.7 C)  98 F (36.7 C)  TempSrc:  Oral    SpO2: 100% 99%  100%  Weight:   54.5 kg   Height:       Physical Exam: General:NAD, comfortable Heart:normal rate, no rub Lungs:clear b/l, no crackle Abdomen:soft, Non-tender, non-distended Extremities:No edema, left AKA Dialysis Access: TDC     OP HD: TTS [facility] 4h  2/2 bath  66kg   TDC   no heparin (allergy)  lock TDC w/ citrate - No ESA or VDRA - Binder: Renvela pwdr 4.8g TID - Sensipar 180mg  every day at home    Assessment/ Plan # Acute hypoxic respiratory failure: In setting of missed HD, pna w/ effusion. CXR with pleural effusion s/p chest tube 7/31 now removed. Resolved.  # PNA/ L parapneumonic pleural effusion: s/p thoracentesis with cultures. Got IV abx course and finished with augmentin  #Left leg wound - s/p AKA 8/15  # ESRD: maintain TTS dialysis schedule. Time shortened to 3hrs. HD today.   #Heparin allergy - use citrate for cath lock.  #Hypotension/ volume: LE edema resolved.  Is on midodrine. Looked dry this weekend and BP's soft so we are keeping even next 1-2 HD sessions and let BP's come up a bit.   #Anemia of ESRD:  Hgb 9-10s - s/p Aranesp on 7/27.  CTM.  # Secondary HPTH: Ca/Phos ok - switched to sevelamer tabs at 2400 tid per pharm recs.  Held cinacalcet due to hypoCa but corrects near normal when accounting for albumin. Have resumed sensipar at 1/2 dose which =  90mg  daily.   #A-fib: On amiodarone + warfarin, has IVC filter  # H/o type B aortic dissection s/p repair (2004)   # dispo - awaiting rehab facility or CIR   Vinson Moselle  MD  [facility] 11/22/2022, 1:17 PM  Recent Labs  Lab 11/18/22 0059 11/19/22 0339  11/20/22 0047  HGB 9.2* 8.5* 8.3*  ALBUMIN 2.6* 2.5*  --   CALCIUM 8.4* 8.7*  --   PHOS 4.1 3.6  --   CREATININE 6.32* 4.03*  --   K 4.6 3.8  --     Inpatient medications:  acetaminophen  650 mg Oral Q6H WA   amiodarone  200 mg Oral Daily   apixaban  2.5 mg Oral BID   Chlorhexidine Gluconate Cloth  6 each Topical Q0600   Chlorhexidine Gluconate Cloth  6 each Topical Q0600   cinacalcet  90 mg Oral Q supper   darbepoetin (ARANESP) injection - DIALYSIS  40 mcg Subcutaneous Q Sat-1800   docusate sodium  100 mg Oral Daily   feeding supplement  1 Container Oral TID BM   gabapentin  100 mg Oral BID   hydrocerin   Topical Daily   lidocaine  3 patch Transdermal Q24H   midodrine  15 mg Oral TID WC   multivitamin  1 tablet Oral QHS   polyethylene glycol  17 g Oral BID   senna-docusate  1 tablet Oral BID   sevelamer carbonate  4.8 g Oral TID WC    anticoagulant sodium citrate 100 mL/hr at 11/18/22 1400   anticoagulant sodium citrate, calcium carbonate (dosed in mg  elemental calcium), camphor-menthol **AND** hydrOXYzine, docusate sodium, HYDROmorphone (DILAUDID) injection, ondansetron **OR** ondansetron (ZOFRAN) IV, oxyCODONE, phenol, sorbitol, zolpidem

## 2022-11-22 NOTE — Progress Notes (Signed)
Nutrition Follow-up  DOCUMENTATION CODES:   Underweight, Severe malnutrition in context of chronic illness  INTERVENTION:   - Continue liberalized Regular diet order  - d/c Boost Breeze  - Reorder Ensure Enlive po BID between meals, each supplement provides 350 kcal and 20 grams of protein   - d/c Magic Cup per pt's preferences   - Continue renal MVI daily   - Encourage PO intake and provide feeding assistance as needed  NUTRITION DIAGNOSIS:   Severe Malnutrition related to chronic illness (ESRD, CHF, PVD) as evidenced by severe fat depletion, severe muscle depletion, percent weight loss (14.6% weight loss in 3 months).  Ongoing, being addressed via diet liberalization and oral nutrition supplements  GOAL:   Patient will meet greater than or equal to 90% of their needs  Progressing  MONITOR:   PO intake, Supplement acceptance, Labs, Weight trends, Skin, I & O's  REASON FOR ASSESSMENT:   Consult Assessment of nutrition requirement/status  ASSESSMENT:   52 year old male who presented to the ED on 7/25 with chest pain and SOB. PMH of ESRD on HD, HTN, CHF, atrial fibrillation, previous aortic dissection, PVD, osteoarthritis, LUE calciphylaxis, blindness. Pt admitted with large loculated pleural effusion, acute hypoxic respiratory failure.  7/31 - s/p L chest tube placement 8/09 - chest tube removed 8/15 - s/p L AKA  Spoke with pt and family members at bedside. Pt reports that he has been doing much better with PO intake since last RD visit. He reports that he is glad to be on a Regular diet. Family has been bringing in food, and pt has been eating this in addition to meal tray food items. Pt had just finished eating 2 large hotdogs from the cafeteria.  Noted Ensure was d/c and Boost Breeze ordered. Pt states that he does not have a preference. Will change back to Ensure to provide the most kcal and protein. Pt does not like [employer]. Will d/c these  supplements.  Encouraged ongoing adequate PO intake with a focus on high-kcal, high-protein foods. Pt and family express understanding.  EDW: 66 kg Admit weight: 62.9 kg Current weight: 55.5 kg  Meal Completions:   8/17: 25%, 0%  8/18: 0%, 15%  8/19: 50%, 60%  Medications reviewed and include: sensipar, aranesp weekly, colace, Boost Breeze TID, rena-vit, miralax, senna, renvela 4.8 grams TID with meals  Labs from 8/18 reviewed: WBC 11.0, hemoglobin 8.3, platelets 149  I/O's: -4.6 L since admit  Diet Order:   Diet Order             Diet regular Room service appropriate? Yes; Fluid consistency: Thin; Fluid restriction: 1200 mL Fluid  Diet effective now                   EDUCATION NEEDS:   Education needs have been addressed  Skin:  Skin Assessment: Skin Integrity Issues: Incisions: L chest, LLE  Last BM:  11/21/22 medium type 6  Height:   Ht Readings from Last 1 Encounters:  11/17/22 6\' 3"  (1.905 m)    Weight:   Wt Readings from Last 1 Encounters:  11/22/22 55.5 kg    Ideal Body Weight:  89.1 kg  BMI:  Body mass index is 15.29 kg/m.  Estimated Nutritional Needs:   Kcal:  2000-2200  Protein:  95-115 grams  Fluid:  1000 ml + UOP    Mertie Clause, MS, RD, LDN Registered Dietitian II Please see AMiON for contact information.

## 2022-11-22 NOTE — Progress Notes (Signed)
Inpatient Rehab Admissions Coordinator:    CIR following. Need to see him transferring out of bed and participating more with therapies. If unable, may need SNF.  Megan Salon, MS, CCC-SLP Rehab Admissions Coordinator  [phone] (celll) [phone] (office)

## 2022-11-22 NOTE — Progress Notes (Signed)
PROGRESS NOTE  Corey [name] LOV:[id number] DOB: January 26, 1971   PCP: [name], NP  Patient is from: Home  DOA: 10/27/2022 LOS: 26  Chief complaints Chief Complaint  Patient presents with   Flank Pain   Shoulder Pain     Brief Narrative / Interim history: 52 year old M with PMH of ESRD on HD TTS, calciphylaxis, paroxysmal A-fib on warfarin, type B aortic dissection, PAD, chronic LE edema, severe malnutrition, blindness and nonambulatory status presenting to the hospital with SOB and several days of left shoulder, chest and flank pain.  As a result of pain, he missed 2 HD sessions.  Per EMS, he was saturating at 85% on RA.  CT chest/abdomen/pelvis showed moderate to large loculated left-sided pleural effusion with lower lobe consolidation.  He was admitted for acute respiratory failure with hypoxia due to left lobar pneumonia with parapneumonic effusion.  PCCM consulted, and had a chest tube placed on 7/31 after INR reversal with vitamin K.  He required multiple rounds of lytics.  Pleural fluid cultures NGTD.  Started on ceftriaxone and Zithromax on 8/1.  Transitioned to p.o. Augmentin on 8/9 that he will continue until 8/15.  Chest tube removed on 8/9.  Found to have left heel necrosis on 8/12.  Vascular surgery consulted and patient underwent left AKA on 8/15.  Anticoagulation resumed with p.o. Eliquis.   Therapy recommended CIR.  CIR following.  Medically stable for discharge.   Subjective: Seen and examined this afternoon.  Continues to endorse significant pain although he does not appear to be in that much distress.  No other complaints.  Objective: Vitals:   11/22/22 1130 11/22/22 1134 11/22/22 1151 11/22/22 1228  BP: 134/71 134/88 124/83 119/79  Pulse: 67 66 67   Resp: 19 (!) 22 19 16   Temp:   98.3 F (36.8 C)   TempSrc:   Oral   SpO2: 100% 100% 100% 100%  Weight:      Height:        Examination:  GENERAL: Appears frail.  No apparent distress.  Nontoxic. HEENT:  MMM.  Hearing grossly intact.  Legally blind. NECK: Supple.  No apparent JVD.  RESP:  No IWOB.  Fair aeration bilaterally. CVS:  RRR. Heart sounds normal.  ABD/GI/GU: BS+. Abd soft, NTND.  MSK/EXT: left AKA DCI.  Staples in place. SKIN: Tree bark skin is in BLE as seen in picture below. NEURO: Awake, alert and oriented appropriately.  No apparent focal neuro deficit. PSYCH: Calm. Normal affect.    Procedures:  7/31-8/9-left chest tube 8/15-left AKA  Microbiology summarized: 7/26-MRSA PCR screen nonreactive 7/31-pleural fluid culture NGTD 7/31-pleural fluid AFB culture pending.  Smear negative. 7/31-pleural fluid fungal culture pending.  Assessment and plan: Principal Problem:   Acute respiratory failure with hypoxia (HCC) Active Problems:   ESRD on dialysis [facility])   Calciphylaxis   Essential hypertension   Chronic diastolic CHF (congestive heart failure) (HCC)   Atrial fibrillation, chronic (HCC)   End-stage renal disease on hemodialysis (HCC)   AAA (abdominal aortic aneurysm) without rupture (HCC)   Hyperkalemia   PVD (peripheral vascular disease) (HCC)   Constipation   Lobar pneumonia (HCC)   Parapneumonic effusion   Gangrene (HCC)   Protein-calorie malnutrition, severe  Acute respiratory failure with hypoxia due to left lower lobe pneumonia with parapneumonic effusion and fluid overload: TTE with LVEF of 50 to 55% and severely enlarged RV and RVSP of 34.4 mmHg.  Seems to have resolved.  Intermittently placed on 2 L by [facility]  although saturating in upper 90s. -Treated with dialysis, left chest tube placement and antibiotics. -Chest tube removed 8/9. -CTX and Zithromax 8/1>>> p.o. Augmentin on 8/9-8/14. -Fluid management by dialysis -Wean oxygen, incentive spirometry, OOB, PT/OT   ESRD on HD TTS: Reportedly missed 2 HD sessions due to pain. -Dialysis per nephrology  Acute on chronic diastolic CHF/PAH: TTE TTE with LVEF of 50 to 55% and severely enlarged RV and RVSP of  34.4 mmHg (60 to 65%,  indeterminate DD and RVSP of 42.8 mmHg in 2020).  Currently appears euvolemic. -Fluid management by dialysis per nephrology.  Gangrene of left heel/peripheral artery disease -S/p left AKA by vascular surgery on 8/15. -Anticoagulation resumed with p.o. Eliquis -Scheduled Tylenol and gabapentin with as needed oxycodone and Dilaudid. -Bowel regimen  Chronic pedal edema/ Venous stasis/calciphylaxis of lower extremity Hypertrophic toenails -Continue Eucerin and Sarna lotion on legs -Podiatry, Dr. Lilian Kapur recommended outpatient follow-up for hypertrophic toenail  Mass in left subclavicular region- Sebaceous cyst? According to the patient and family, this mass is new, however, according to the CT scan that was performed today, the mass has been present since 2021 and is unchanged   Paroxysmal atrial fibrillation: Rate controlled.  History of HIT. -Continue amiodarone -Anticoagulation resumed with p.o. Eliquis   Chronic hypotension:  TTE as above.  On midodrine.  Normotensive today. -Continue midodrine  Anemia of chronic disease: H&H relatively stable. Recent Labs    11/11/22 0008 11/12/22 0049 11/14/22 0038 11/15/22 0108 11/16/22 0722 11/17/22 0039 11/17/22 0921 11/18/22 0059 11/19/22 0339 11/20/22 0047  HGB 10.9* 10.7* 9.7* 9.7* 9.7* 9.6* 10.9* 9.2* 8.5* 8.3*  -Continue monitoring -IV iron and Aranesp per nephrology  Type B thoracic aortic dissection with aneurysmal dilatation of the proximal descending thoracic aorta up to 4.5 cm in diameter. -Continue surveillance  Bedbound/wheelchair-bound/legally blind -PT/OT-recommended CIR.  Severe malnutrition Body mass index is 15.29 kg/m. Nutrition Problem: Severe Malnutrition Etiology: chronic illness (ESRD, CHF, PVD) Signs/Symptoms: severe fat depletion, severe muscle depletion, percent weight loss (14.6% weight loss in 3 months) Percent weight loss: 14.6 % Interventions: Ensure Enlive (each supplement  provides 350kcal and 20 grams of protein), Magic cup, MVI, Liberalize Diet, Education   DVT prophylaxis:  apixaban (ELIQUIS) tablet 2.5 mg Start: 11/20/22 1000 SCD's Start: 11/17/22 1239 apixaban (ELIQUIS) tablet 2.5 mg  Code Status: Full code Family Communication: None at bedside today. Level of care: Med-Surg Status is: Inpatient Remains inpatient appropriate because: Safe disposition/CIR   Final disposition: CIR or SNF Consultants:  Nephrology Vascular surgery Podiatry over the phone  35 minutes with more than 50% spent in reviewing records, counseling patient/family and coordinating care.   Sch Meds:  Scheduled Meds:  acetaminophen  650 mg Oral Q6H WA   amiodarone  200 mg Oral Daily   apixaban  2.5 mg Oral BID   Chlorhexidine Gluconate Cloth  6 each Topical Q0600   Chlorhexidine Gluconate Cloth  6 each Topical Q0600   cinacalcet  90 mg Oral Q supper   darbepoetin (ARANESP) injection - DIALYSIS  40 mcg Subcutaneous Q Sat-1800   docusate sodium  100 mg Oral Daily   feeding supplement  237 mL Oral BID BM   gabapentin  100 mg Oral BID   hydrocerin   Topical Daily   lidocaine  3 patch Transdermal Q24H   midodrine  15 mg Oral TID WC   multivitamin  1 tablet Oral QHS   polyethylene glycol  17 g Oral BID   senna-docusate  1 tablet Oral BID  sevelamer carbonate  4.8 g Oral TID WC   Continuous Infusions:  anticoagulant sodium citrate 100 mL/hr at 11/18/22 1400   PRN Meds:.anticoagulant sodium citrate, calcium carbonate (dosed in mg elemental calcium), camphor-menthol **AND** hydrOXYzine, docusate sodium, HYDROmorphone (DILAUDID) injection, ondansetron **OR** ondansetron (ZOFRAN) IV, oxyCODONE, phenol, sorbitol, zolpidem  Antimicrobials: Anti-infectives (From admission, onward)    Start     Dose/Rate Route Frequency Ordered Stop   11/17/22 0915  ceFAZolin (ANCEF) IVPB 2g/100 mL premix        2 g 200 mL/hr over 30 Minutes Intravenous  Once 11/17/22 0905 11/17/22 0934    11/17/22 0910  ceFAZolin (ANCEF) 2-4 GM/100ML-% IVPB       Note to Pharmacy: Samuella Cota O: cabinet override      11/17/22 0910 11/17/22 0945   11/11/22 2200  amoxicillin-clavulanate (AUGMENTIN) 500-125 MG per tablet 1 tablet        1 tablet Oral Daily at bedtime 11/11/22 1316 11/16/22 2202   11/08/22 1800  cefTRIAXone (ROCEPHIN) 2 g in sodium chloride 0.9 % 100 mL IVPB  Status:  Discontinued        2 g 200 mL/hr over 30 Minutes Intravenous Every 24 hours 11/08/22 1656 11/11/22 1316   11/03/22 0915  cefTRIAXone (ROCEPHIN) 2 g in sodium chloride 0.9 % 100 mL IVPB        2 g 200 mL/hr over 30 Minutes Intravenous Every 24 hours 11/03/22 0823 11/08/22 1701   11/03/22 0915  azithromycin (ZITHROMAX) 500 mg in sodium chloride 0.9 % 250 mL IVPB        500 mg 250 mL/hr over 60 Minutes Intravenous Every 24 hours 11/03/22 0823 11/08/22 1701        I have personally reviewed the following labs and images: CBC: Recent Labs  Lab 11/16/22 0722 11/17/22 0039 11/17/22 0921 11/18/22 0059 11/19/22 0339 11/20/22 0047  WBC 11.0* 9.3  --  12.0* 11.8* 11.0*  HGB 9.7* 9.6* 10.9* 9.2* 8.5* 8.3*  HCT 33.2* 33.0* 32.0* 31.2* 28.6* 28.3*  MCV 95.1 94.8  --  96.6 92.9 94.0  PLT 136* 146*  --  189 155 149*   BMP &GFR Recent Labs  Lab 11/16/22 0722 11/17/22 0921 11/17/22 0937 11/18/22 0059 11/19/22 0339  NA 135 138 138 137 135  K 4.0 4.3 4.3 4.6 3.8  CL 97* 100 101 100 94*  CO2 25  --  26 24 28   GLUCOSE 102* 86 86 142* 93  BUN 26* 33* 33* 42* 27*  CREATININE 4.38* 5.80* 5.59* 6.32* 4.03*  CALCIUM 8.8*  --  9.0 8.4* 8.7*  MG 2.0  --   --  2.0 1.9  PHOS 3.4  --   --  4.1 3.6   Estimated Creatinine Clearance: 16.8 mL/min (A) (by C-G formula based on SCr of 4.03 mg/dL (H)). Liver & Pancreas: Recent Labs  Lab 11/16/22 0722 11/18/22 0059 11/19/22 0339  ALBUMIN 2.5* 2.6* 2.5*   No results for input(s): "LIPASE", "AMYLASE" in the last 168 hours. No results for input(s): "AMMONIA" in the  last 168 hours. Diabetic: No results for input(s): "HGBA1C" in the last 72 hours. Recent Labs  Lab 11/17/22 1101 11/17/22 1126 11/17/22 1326  GLUCAP 68* 115* 112*   Cardiac Enzymes: No results for input(s): "CKTOTAL", "CKMB", "CKMBINDEX", "TROPONINI" in the last 168 hours. No results for input(s): "PROBNP" in the last 8760 hours. Coagulation Profile: Recent Labs  Lab 11/18/22 0059 11/19/22 0339  INR 3.2* 2.7*   Thyroid Function Tests: No results  for input(s): "TSH", "T4TOTAL", "FREET4", "T3FREE", "THYROIDAB" in the last 72 hours. Lipid Profile: No results for input(s): "CHOL", "HDL", "LDLCALC", "TRIG", "CHOLHDL", "LDLDIRECT" in the last 72 hours. Anemia Panel: No results for input(s): "VITAMINB12", "FOLATE", "FERRITIN", "TIBC", "IRON", "RETICCTPCT" in the last 72 hours. Urine analysis: No results found for: "COLORURINE", "APPEARANCEUR", "LABSPEC", "PHURINE", "GLUCOSEU", "HGBUR", "BILIRUBINUR", "KETONESUR", "PROTEINUR", "UROBILINOGEN", "NITRITE", "LEUKOCYTESUR" Sepsis Labs: Invalid input(s): "PROCALCITONIN", "LACTICIDVEN"  Microbiology: No results found for this or any previous visit (from the past 240 hour(s)).  Radiology Studies: No results found.    Linnet Bottari T. Merian Wroe [facility]  If 7PM-7AM, please contact night-coverage [email or web address] 11/22/2022, 2:32 PM

## 2022-11-22 NOTE — Progress Notes (Signed)
PT Cancellation Note  Patient Details Name: Corey [name] MRN: [id number] DOB: [birth year]   Cancelled Treatment:    Reason Eval/Treat Not Completed: (P) Patient declined, no reason specified (Pt reporting he is too fatigued and weak from dialysis earlier. Will continue to follow per PT POC.)   Johny Shock 11/22/2022, 1:47 PM

## 2022-11-22 NOTE — Plan of Care (Signed)

## 2022-11-23 DIAGNOSIS — J9601 Acute respiratory failure with hypoxia: Secondary | ICD-10-CM | POA: Diagnosis not present

## 2022-11-23 MED ORDER — CHLORHEXIDINE GLUCONATE CLOTH 2 % EX PADS
6.0000 | MEDICATED_PAD | Freq: Every day | CUTANEOUS | Status: DC
  Administered 2022-11-24 – 2022-11-29 (×6): 6 via TOPICAL

## 2022-11-23 MED ORDER — METHOCARBAMOL 500 MG PO TABS
500.0000 mg | ORAL_TABLET | Freq: Four times a day (QID) | ORAL | Status: DC | PRN
  Administered 2022-11-23 – 2022-11-29 (×12): 500 mg via ORAL
  Filled 2022-11-23 (×12): qty 1

## 2022-11-23 NOTE — TOC Progression Note (Signed)
Transition of Care [facility]) - Progression Note    Patient Details  Name: Corey [name] MRN: [id number] Date of Birth: Jan 08, 1971  [facility] of [facility] CM/SW Contact  Harriet Masson, RN Phone Number: 11/23/2022, 11:59 AM  Clinical Narrative:    CIR following.   Expected Discharge Plan: Home w Home Health Services Barriers to Discharge: Continued Medical Work up  Expected Discharge Plan and Services       Living arrangements for the past 2 months: Single Family Home                                       Social Determinants of Health (SDOH) Interventions SDOH Screenings   Food Insecurity: No Food Insecurity (10/27/2022)  Housing: Medium Risk (10/27/2022)  Transportation Needs: No Transportation Needs (10/27/2022)  Utilities: Not At Risk (10/27/2022)  Alcohol Screen: Low Risk  (09/26/2022)  Depression (PHQ2-9): Medium Risk (09/26/2022)  Financial Resource Strain: Medium Risk (09/26/2022)  Physical Activity: Inactive (09/26/2022)  Social Connections: Socially Isolated (09/26/2022)  Stress: No Stress Concern Present (09/26/2022)  Tobacco Use: Low Risk  (11/17/2022)    Readmission Risk Interventions     No data to display

## 2022-11-23 NOTE — Plan of Care (Signed)

## 2022-11-23 NOTE — Consult Note (Signed)
Physical Medicine and Rehabilitation Consult Reason for Consult: Left AKA secondary to left heel necrosis  Referring Physician: Burnadette Pop, MD   HPI: Corey [name] is a 52 y.o. male with a history of ESRD on dialysis TTS schedule, calciphylaxis, paroxysmal afib on warfarin, type B aortic dissection, PAD, chronic lower extremity edema, malnutrition, blindness, nonambulatory status, who presented with shortness of breath, left shoulder/chest./flank pain, missed 2 dialysis sessions. He was also hypoxic on presentation. CT chest/abdomen/pelvis showed moderate to large loculated left sided pleural effusion with lower lobe consolidation. S/p chest tube placement 7/31, removed 8/9. Found to have left heel necrosis 8/12 s/p L AKA 8/15. PMH includes ESRD (HD TTS), PAF on warfarin, type B aortic dissection, PAD< chronic LE edema, severe malnutrition, blindness. Physical Medicine & Rehabilitation was consulted to assess candidacy for CIR.     ROS +emotional regarding limb loss Past Medical History:  Diagnosis Date   Arthritis    hands and shoulders   Blindness and low vision    "Stargardt disease"   Dissection of aorta (HCC) 2004   a. s/p extensive repeair in 2004 in [place] complicated by ESRD, lower extremity paralysis, coma, and extended hospitalization of 2 years   ESRD (end stage renal disease) (HCC)    a. TTS   Headache    History of cardioversion 2014   Hypertension    Neuropathy    Non-healing non-surgical wound 03/2016   PAF (paroxysmal atrial fibrillation) (HCC)    a. s/p DCCV in 2014; b. on Coumadin; c. CHADS2VASc => 2 (HTN, vascular disease)   Paralysis (HCC)    due to dissection of aorta in 2004, lower extremities   Pneumonia    Past Surgical History:  Procedure Laterality Date   AMPUTATION Left 11/17/2022   Procedure: AMPUTATION ABOVE KNEE;  Surgeon: Nada Libman, MD;  Location: [facility] OR;  Service: Vascular;  Laterality: Left;   APPLICATION OF WOUND VAC Left 04/13/2016    Procedure: APPLICATION OF WOUND VAC;  Surgeon: Fransisco Hertz, MD;  Location: [facility] OR;  Service: Vascular;  Laterality: Left;   APPLICATION OF WOUND VAC Left 04/18/2016   Procedure: APPLICATION OF WOUND VAC;  Surgeon: Maeola Harman, MD;  Location: [facility] OR;  Service: Vascular;  Laterality: Left;  Wound vac change    APPLICATION OF WOUND VAC Left 04/20/2016   Procedure: WOUND VAC CHANGE;  Surgeon: Fransisco Hertz, MD;  Location: [facility] OR;  Service: Vascular;  Laterality: Left;   AV FISTULA PLACEMENT     BASCILIC VEIN TRANSPOSITION Left 12/09/2015   Procedure: FIRST STAGE BASILIC VEIN TRANSPOSITION LEFT UPPER ARM;  Surgeon: Fransisco Hertz, MD;  Location: [facility] OR;  Service: Vascular;  Laterality: Left;   BASCILIC VEIN TRANSPOSITION Left 03/09/2016   Procedure: SECOND STAGE BASILIC VEIN TRANSPOSITION WITH REVISION OF ANASTOMOSIS LEFT UPPER ARM;  Surgeon: Fransisco Hertz, MD;  Location: [facility] OR;  Service: Vascular;  Laterality: Left;   CARDIOVERSION     CARDIOVERSION N/A 01/04/2019   Procedure: CARDIOVERSION;  Surgeon: Lewayne Bunting, MD;  Location: [facility] ENDOSCOPY;  Service: Cardiovascular;  Laterality: N/A;   CHEST TUBE INSERTION Left 11/02/2022   Procedure: CHEST TUBE INSERTION;  Surgeon: Lynnell Catalan, MD;  Location: [facility] ENDOSCOPY;  Service: Pulmonary;  Laterality: Left;  Please have both Pigtail (COOK) and chest tube tray available   REPAIR OF ACUTE ASCENDING THORACIC AORTIC DISSECTION     REVISON OF ARTERIOVENOUS FISTULA Left 04/20/2016   Procedure: LIGATION OF BASILIC VEIN  TRANSPOSITION;  Surgeon: Fransisco Hertz, MD;  Location: [facility] OR;  Service: Vascular;  Laterality: Left;   WOUND DEBRIDEMENT Left 04/13/2016   Procedure: DEBRIDEMENT WOUND;  Surgeon: Fransisco Hertz, MD;  Location: [facility] OR;  Service: Vascular;  Laterality: Left;   Family History  Problem Relation Age of Onset   Cancer Mother    Hypertension Mother    Cancer Father    Hypertension Father    Thyroid disease Sister    Stroke Brother        38    Social History:  reports that he has never smoked. He has never used smokeless tobacco. He reports that he does not drink alcohol and does not use drugs. Allergies:  Allergies  Allergen Reactions   Ciprofloxacin Other (See Comments)    Aortic dissection   Heparin Other (See Comments)    UNSPECIFIED REACTION :  On Coumadin since 2004   HIT panel negative 01/19/17   Doxercalciferol Other (See Comments)   Quinolones Other (See Comments)    unknown   Medications Prior to Admission  Medication Sig Dispense Refill   amiodarone (PACERONE) 200 MG tablet Take 1 tablet (200 mg total) by mouth daily. 90 tablet 3   cinacalcet (SENSIPAR) 60 MG tablet Take 120 mg by mouth every evening.     Multiple Vitamins-Minerals (MULTIVITAMIN WITH MINERALS) tablet Take 1 tablet by mouth daily.     RENVELA 2.4 g PACK Take 4.8 g by mouth in the morning, at noon, and at bedtime.     warfarin (COUMADIN) 2 MG tablet Take 1 tablet by mouth daily or as directed by Anticoagulation Clinic. 30 tablet 0   docusate sodium (COLACE) 100 MG capsule Take 1 capsule (100 mg total) by mouth every 12 (twelve) hours. (Patient not taking: Reported on 10/27/2022) 60 capsule 0   metoprolol tartrate (LOPRESSOR) 25 MG tablet Take 0.5 tablets (12.5 mg total) by mouth 2 (two) times daily. Hold on the AM of dialysis days (Patient not taking: Reported on 10/27/2022) 90 tablet 3    Home: Home Living Family/patient expects to be discharged to:: Private residence Living Arrangements: Other relatives (brother) Available Help at Discharge: Family, Available 24 hours/day Type of Home: House Home Access: Ramped entrance Home Layout: One level Bathroom Shower/Tub: Tub/shower unit, Sponge bathes at baseline Bathroom Toilet: Standard Bathroom Accessibility: Yes Home Equipment: BSC/3in1, Wheelchair - manual  Lives With: Family (brother)  Functional History: Prior Function Prior Level of Function : Needs assist Physical Assist : ADLs  (physical), Mobility (physical) Mobility (physical): Transfers (occasionally, but was independent with transfers) ADLs (physical): IADLs Mobility Comments: mod indep w/c level mobility ADLs Comments: sponges bathes at baseline; brother assists with higher-level ADLs and iADLs as needed Functional Status:  Mobility: Bed Mobility Overal bed mobility: Needs Assistance Bed Mobility: Rolling Rolling: Supervision Sidelying to sit: Min assist Supine to sit: Min assist, HOB elevated, Used rails Sit to supine: Supervision Sit to sidelying: Contact guard assist General bed mobility comments: returned to supine due to dizziness and soft BP Transfers Overall transfer level: Needs assistance Equipment used: None Transfers: Bed to chair/wheelchair/BSC Bed to/from chair/wheelchair/BSC transfer type:: Lateral/scoot transfer  Lateral/Scoot Transfers: Min assist, From elevated surface, +2 safety/equipment General transfer comment: pt declined      ADL: ADL Overall ADL's : Needs assistance/impaired Eating/Feeding: Set up, Sitting Grooming: Set up, Bed level Upper Body Bathing: Set up, Sitting Lower Body Bathing: Moderate assistance, Sit to/from stand, Sitting/lateral leans Lower Body Bathing Details (indicate cue type and  reason): for thoroughness Upper Body Dressing : Set up, Sitting Lower Body Dressing: Maximal assistance, Sitting/lateral leans Lower Body Dressing Details (indicate cue type and reason): donning socks, poor skin integrity and painful Toilet Transfer: Minimal assistance, Squat-pivot Toilet Transfer Details (indicate cue type and reason): patient completing incremental scoot to recliner, min A solely for safety Toileting- Clothing Manipulation and Hygiene: Minimal assistance, Sitting/lateral lean Toileting - Clothing Manipulation Details (indicate cue type and reason): for thoroughness Functional mobility during ADLs: Minimal assistance General ADL Comments: Session limited by  low BPs when attempting functional activities, however motivated to progress to return to prior level of function.  Cognition: Cognition Overall Cognitive Status: Within Functional Limits for tasks assessed Orientation Level: Oriented X4 Cognition Arousal: Alert Behavior During Therapy: Flat affect Overall Cognitive Status: Within Functional Limits for tasks assessed General Comments: WFL for simple tasks; brothers present which seemed to help motivation and interaction  Blood pressure 90/66, pulse 64, temperature 98.6 F (37 C), temperature source Oral, resp. rate 17, height 6\' 3"  (1.905 m), weight 55.5 kg, SpO2 98%. Physical Exam Gen: no distress, normal appearing HEENT: oral mucosa pink and moist, NCAT Cardio: Reg rate Chest: normal effort, normal rate of breathing Abd: soft, non-distended Ext: no edema Psych: pleasant, normal affect Skin: intact Neuro: Alert and oriented Functional mobility: performing oral hygiene with assistance MSK: Left AKA incision with staples, c/d/i  No results found for this or any previous visit (from the past 24 hour(s)). No results found.  Assessment/Plan: Diagnosis: Left AKA Does the need for close, 24 hr/day medical supervision in concert with the patient's rehab needs make it unreasonable for this patient to be served in a less intensive setting? Yes Co-Morbidities requiring supervision/potential complications:  1) paroxysmal afib 2) Left subclavicular region mass 3) chronic hypotension: continue midodrine 4) ESRD: continue HD 5) Type B aortic dissection: will need to limit the intensity of therapy and focus on ADLs and basic mobility Due to bladder management, bowel management, safety, skin/wound care, disease management, medication administration, pain management, and patient education, does the patient require 24 hr/day rehab nursing? Yes Does the patient require coordinated care of a physician, rehab nurse, therapy disciplines of PT, OT  to address physical and functional deficits in the context of the above medical diagnosis(es)? Yes Addressing deficits in the following areas: balance, endurance, locomotion, strength, transferring, bowel/bladder control, bathing, dressing, feeding, grooming, toileting, and psychosocial support Can the patient actively participate in an intensive therapy program of at least 3 hrs of therapy per day at least 5 days per week? Yes The potential for patient to make measurable gains while on inpatient rehab is excellent Anticipated functional outcomes upon discharge from inpatient rehab are supervision  with PT, supervision with OT, independent with SLP. Estimated rehab length of stay to reach the above functional goals is: 10-14 days Anticipated discharge destination: Home Overall Rehab/Functional Prognosis: excellent  POST ACUTE RECOMMENDATIONS: This patient's condition is appropriate for continued rehabilitative care in the following setting: CIR Patient has agreed to participate in recommended program. Yes Note that insurance prior authorization may be required for reimbursement for recommended care.    I have personally performed a face to face diagnostic evaluation of this patient. Additionally, I have examined the patient's medical record including any pertinent labs and radiographic images. If the physician assistant has documented in this note, I have reviewed and edited or otherwise concur with the physician assistant's documentation.  Thanks,  Horton Chin, MD 11/23/2022

## 2022-11-23 NOTE — Progress Notes (Signed)
Corey [name]  Subjective:  Seen in room, no c/o's.   Objective Vital signs in last 24 hours: Vitals:   11/19/22 1130 11/19/22 1133 11/19/22 1142 11/19/22 1155  BP:  (!) 113/90  128/70  Pulse: 66 67  66  Resp: 12 16  18   Temp:  98 F (36.7 C)  98 F (36.7 C)  TempSrc:  Oral    SpO2: 100% 99%  100%  Weight:   54.5 kg   Height:       Physical Exam: General:NAD, comfortable Heart:normal rate, no rub Lungs:clear b/l, no crackle Abdomen:soft, Non-tender, non-distended Extremities:No edema, left AKA Dialysis Access: TDC     OP HD: TTS [facility] 4h  2/2 bath  66kg   TDC   no heparin (allergy)  lock TDC w/ citrate - No ESA or VDRA - Binder: Renvela pwdr 4.8g TID - Sensipar 180mg  every day at home    Assessment/ Plan # Acute hypoxic respiratory failure: In setting of missed HD, pna w/ effusion. CXR with pleural effusion s/p chest tube 7/31 now removed. Resolved.  # PNA/ L parapneumonic pleural effusion: s/p thoracentesis with cultures. Got IV abx course and finished with augmentin  #Left leg wound - s/p AKA 8/15  # ESRD: maintain TTS dialysis schedule. Time shortened to 3hrs. HD tomorrow.   #Heparin allergy - use citrate for cath lock.  #Hypotension/ volume: LE edema resolved.  Is on midodrine. Looked dry this past weekend and BP's were soft so we are keeping even w/ HD this week, watch for BP to come up.   #Anemia of ESRD:  Hgb 9-10s - s/p Aranesp on 7/27.  CTM.  # Secondary HPTH: Ca/Phos ok - switched to sevelamer tabs at 2400 tid per pharm recs.  Held cinacalcet due to hypoCa but corrects near normal when accounting for albumin. Have resumed sensipar at 1/2 dose which =  90mg  daily.   #A-fib: On amiodarone + warfarin, has IVC filter  # H/o type B aortic dissection s/p repair (2004)   # dispo - awaiting rehab facility or CIR   Rob Arlean Hopping  MD  [facility] 11/23/2022, 5:08 PM  Recent Labs  Lab 11/18/22 0059 11/19/22 0339  11/20/22 0047  HGB 9.2* 8.5* 8.3*  ALBUMIN 2.6* 2.5*  --   CALCIUM 8.4* 8.7*  --   PHOS 4.1 3.6  --   CREATININE 6.32* 4.03*  --   K 4.6 3.8  --     Inpatient medications:  acetaminophen  650 mg Oral Q6H WA   amiodarone  200 mg Oral Daily   apixaban  2.5 mg Oral BID   Chlorhexidine Gluconate Cloth  6 each Topical Q0600   cinacalcet  90 mg Oral Q supper   darbepoetin (ARANESP) injection - DIALYSIS  40 mcg Subcutaneous Q Sat-1800   docusate sodium  100 mg Oral Daily   feeding supplement  237 mL Oral BID BM   gabapentin  100 mg Oral BID   hydrocerin   Topical Daily   lidocaine  3 patch Transdermal Q24H   midodrine  15 mg Oral TID WC   multivitamin  1 tablet Oral QHS   polyethylene glycol  17 g Oral BID   senna-docusate  1 tablet Oral BID   sevelamer carbonate  4.8 g Oral TID WC    anticoagulant sodium citrate 100 mL/hr at 11/18/22 1400   anticoagulant sodium citrate, calcium carbonate (dosed in mg elemental calcium), camphor-menthol **AND** hydrOXYzine, docusate sodium, HYDROmorphone (DILAUDID)  injection, methocarbamol, ondansetron **OR** ondansetron (ZOFRAN) IV, oxyCODONE, phenol, sorbitol, zolpidem

## 2022-11-23 NOTE — Progress Notes (Signed)
Occupational Therapy Treatment Patient Details Name: Corey [name] MRN: [id number] DOB: [birth year] Today's Date: 11/23/2022   History of present illness Pt is a 52 y.o. male admitted 10/27/22 with SOB, L shoulder and chest pain missing 2x HD sessions. CT showed mod-large loculated L pleural effusion with lower lobe consolidation. S/p chest tube placement 7/31; removed 8/9. Found to have L heel necrosis 8/12. S/p L AKA 8/15. PMH includes ESRD (HD TTS), PAF on Warfarin, type B aortic dissection, PAD, chronic LE edema, severe malnutrition, blindness.   OT comments  OT educated pt in B UE AROM exercise to increase strength and activity tolerance for carryover to ADLs and functional transfers. OT also educated pt in techniques for increased safety and independence with ADLs and functional transfers. Pt currently demonstrates ability to complete UB ADLs with Mod I to Set up, LB ADLs with Min to Mod assist, and functional lateral/scoot transfers with Contact guard to Min assist with a drop-arm recliner or BSC. Pt demonstrated good carryover of training from PT session earlier this day, participated well in OT session, and is making progress toward OT goals. Pt is motivated to continue with rehab to increase strength, endurance, and independence. Pt's VSS on 2L continuous O2 through nasal cannula throughout session. Pt will benefit from continued acute skilled OT services to address deficits outlined below and increase safety and independence with functional tasks. Post acute discharge, pt will benefit from intensive inpatient skilled rehab services > 3 hours per day to maximize rehab potential. Pt will also benefit from continued acute chaplain services and participation in support group for people who have had amputations post discharge to support overall mental health.       If plan is discharge home, recommend the following:  A little help with walking and/or transfers;A little help with  bathing/dressing/bathroom;Assistance with cooking/housework;Direct supervision/assist for medications management;Direct supervision/assist for financial management;Assist for transportation;Help with stairs or ramp for entrance   Equipment Recommendations  Other (comment) (Defer to next level of care)    Recommendations for Other Services Rehab consult    Precautions / Restrictions Precautions Precautions: Fall;Other (comment) Precaution Comments: s/p L AKA 11/17/22; watch soft BP Restrictions Weight Bearing Restrictions: Yes LLE Weight Bearing: Non weight bearing Other Position/Activity Restrictions: offload R heel       Mobility Bed Mobility Overal bed mobility: Needs Assistance Bed Mobility: Sit to Supine       Sit to supine: Supervision, Used rails   General bed mobility comments: Pt with good carryover of training from PT session earlier this day with pt requiring Supervision, increased time, and only occasional cues for technique during sit to supine transfer this session. Pt required min assist for positioning B LE once in bed.    Transfers Overall transfer level: Needs assistance Equipment used: None Transfers: Bed to chair/wheelchair/BSC            Lateral/Scoot Transfers: Contact guard assist General transfer comment: Pt requiring increased time. Pt with good carryover of training from PT session earlier this day with pt requiring CGA and only occasional cues for technique with lateral/scoot transfer.     Balance Overall balance assessment: Needs assistance Sitting-balance support: Feet supported, Bilateral upper extremity supported Sitting balance-Leahy Scale: Fair                                     ADL either performed or assessed with clinical judgement  ADL Overall ADL's : Needs assistance/impaired Eating/Feeding: Modified independent;Sitting Eating/Feeding Details (indicate cue type and reason): Orientation secondary to low  vision Grooming: Modified independent;Sitting Grooming Details (indicate cue type and reason): Orientation secondary to low vision Upper Body Bathing: Set up;Sitting   Lower Body Bathing: Minimal assistance;Sitting/lateral leans   Upper Body Dressing : Set up;Sitting   Lower Body Dressing: Moderate assistance;Sitting/lateral leans   Toilet Transfer: Minimal assistance;Squat-pivot;BSC/3in1;Requires drop arm   Toileting- Clothing Manipulation and Hygiene: Minimal assistance;Sitting/lateral lean         General ADL Comments: Pt with decreased activity tolerance.    Extremity/Trunk Assessment Upper Extremity Assessment Upper Extremity Assessment: Generalized weakness   Lower Extremity Assessment Lower Extremity Assessment: Defer to PT evaluation;LLE deficits/detail LLE Deficits / Details: s/p L AKA 11/17/22        Vision       Perception     Praxis      Cognition Arousal: Alert Behavior During Therapy: Flat affect Overall Cognitive Status: Within Functional Limits for tasks assessed                                 General Comments: Pt expressing challenges dealing with emotional aspects of needing to have L AKA unexpectedly. OT affirmed pt's feelings and offered supportive listening. Pt states talking with chaplain was helpful and also states he spoke with a member of his cousin's church for support. Pt reports he would like to continue with chaplain services acutely and is interested in participating in a support group post acute discharge.        Exercises Exercises: General Upper Extremity General Exercises - Upper Extremity Shoulder Flexion: AROM, Strengthening, Both, Other reps (comment), Seated (2 sets of 10 reps) Shoulder Extension: AROM, Strengthening, Both, Other reps (comment), Seated (2 sets of 10 reps) Shoulder ABduction: AROM, Strengthening, Both, Other reps (comment), Seated (2 sets of 10 reps) Shoulder ADduction: AROM, Strengthening, Both,  Other reps (comment), Seated (2 sets of 10 reps) Elbow Flexion: AROM, Strengthening, Both, Other reps (comment), Seated (2 sets of 10 reps) Elbow Extension: AROM, Strengthening, Both, Other reps (comment), Seated (2 sets of 10 reps) Chair Push Up: AROM, Strengthening, Both, Other reps (comment), Other (comment) (2 sets of 5 reps)    Shoulder Instructions       General Comments VSS on 2L O2 throughout session. Pt's L residual limb incision intact with no drainage, Pt with discoloration of R LE.    Pertinent Vitals/ Pain       Pain Assessment Pain Assessment: 0-10 Pain Score: 8  Pain Location: L flank and L LE Pain Descriptors / Indicators: Aching, Dull, Discomfort, Grimacing Pain Intervention(s): Limited activity within patient's tolerance, Monitored during session, Repositioned, Patient requesting pain meds-RN notified, Other (comment) (RN gave muscle relaxer during session)  Home Living                                          Prior Functioning/Environment              Frequency  Min 1X/week        Progress Toward Goals  OT Goals(current goals can now be found in the care plan section)  Progress towards OT goals: Progressing toward goals  Acute Rehab OT Goals Patient Stated Goal: To get stronger and be independent again OT Goal Formulation:  With patient  Plan      Co-evaluation                 AM-PAC OT "6 Clicks" Daily Activity     Outcome Measure   Help from another person eating meals?: None (Orientation secondary to low vision) Help from another person taking care of personal grooming?: None (Orientation secondary to low vision) Help from another person toileting, which includes using toliet, bedpan, or urinal?: A Little Help from another person bathing (including washing, rinsing, drying)?: A Little Help from another person to put on and taking off regular upper body clothing?: A Little Help from another person to put on and  taking off regular lower body clothing?: A Lot 6 Click Score: 19    End of Session Equipment Utilized During Treatment: Oxygen  OT Visit Diagnosis: Unsteadiness on feet (R26.81);Other abnormalities of gait and mobility (R26.89);Muscle weakness (generalized) (M62.81);Adult, failure to thrive (R62.7)   Activity Tolerance Patient tolerated treatment well   Patient Left in bed;with call bell/phone within reach;with bed alarm set   Nurse Communication Mobility status;Other (comment) (Pt participated well. Pt with L LE and L flank pain and requesting medication.)        Time: 1191-4782 OT Time Calculation (min): 47 min  Charges: OT General Charges $OT Visit: 1 Visit OT Treatments $Self Care/Home Management : 8-22 mins $Therapeutic Activity: 8-22 mins $Therapeutic Exercise: 8-22 mins  Corey [name] "Orson Eva., OTR/L, MA Acute Rehab [phone]   Lendon Colonel 11/23/2022, 1:34 PM

## 2022-11-23 NOTE — Progress Notes (Signed)
Physical Therapy Treatment Patient Details Name: Corey [name] MRN: [id number] DOB: 1970-09-15 Today's Date: 11/23/2022   History of Present Illness Pt is a 52 y.o. male admitted 10/27/22 with SOB, L shoulder and chest pain missing 2x HD sessions. CT showed mod-large loculated L pleural effusion with lower lobe consolidation. S/p chest tube placement 7/31; removed 8/9. Found to have L heel necrosis 8/12. S/p L AKA 8/15. PMH includes ESRD (HD TTS), PAF on Warfarin, type B aortic dissection, PAD, chronic LE edema, severe malnutrition, blindness.    PT Comments  Pt emotional today regarding "Losing my leg." Spoke extensively with patient about processing his feeling and the possibility of getting a prosthesis. Pt appreciated the advice and education. Focused on lateral scoot transfer to drop arm recliner, pt requiring modA and max directional cues. Pt to benefit from inpatient rehab program > 3 hours a day to achieve safe mod I w/c level of function. Pt would also benefit from an amputation support group. Acute PT to cont to follow.    If plan is discharge home, recommend the following: A little help with walking and/or transfers;A little help with bathing/dressing/bathroom;Assistance with cooking/housework;Assist for transportation;Help with stairs or ramp for entrance   Can travel by private vehicle        Equipment Recommendations  Wheelchair (measurements PT);Wheelchair cushion (measurements PT);Rolling walker (2 wheels)    Recommendations for Other Services Rehab consult     Precautions / Restrictions Precautions Precautions: Fall;Other (comment) Precaution Comments: s/p L AKA 11/17/22; watch soft BP Restrictions Weight Bearing Restrictions: Yes LLE Weight Bearing: Non weight bearing     Mobility  Bed Mobility Overal bed mobility: Needs Assistance Bed Mobility: Supine to Sit     Supine to sit: Min assist, HOB elevated, Used rails     General bed mobility comments: max  directional verbal cues to achieve long sit via propping up on elbows and then to hands, increased time, pt ultimately used rails to try to pull self over to side    Transfers Overall transfer level: Needs assistance Equipment used: None Transfers: Bed to chair/wheelchair/BSC            Lateral/Scoot Transfers: Mod assist General transfer comment: max directional verbal cues for safe technique, placement of hips prior to scooting over and modA to clear bottom initially however improved with each scoot. Pt with limited WBing tolerance on R LE as well relying on mostly UEs. increased time    Ambulation/Gait                   Stairs             Wheelchair Mobility     Tilt Bed    Modified Rankin (Stroke Patients Only)       Balance Overall balance assessment: Needs assistance Sitting-balance support: Feet supported, Bilateral upper extremity supported Sitting balance-Leahy Scale: Fair Sitting balance - Comments: pt with increased trunk flexion, resting elbows on knees initially                                    Cognition Arousal: Alert Behavior During Therapy: Flat affect Overall Cognitive Status: Within Functional Limits for tasks assessed                                 General Comments: pt emotional today regarding having "lost  my leg. I don't want to cry." Spent extensive time talking to patient about processing his feelings in addition to the posibility of getting a prosthesis once the incision heals. discussed at length phantom limb pain, what it is and how to help manage it and desensitize the residual limb by gentle rubbing it with his hand. pt very appreciative of discussion        Exercises      General Comments General comments (skin integrity, edema, etc.): L residual limb incision intact with no drainage, pt with discoloration of R LE      Pertinent Vitals/Pain Pain Assessment Pain Assessment: 0-10 Pain  Score: 8  Pain Location: LLE Pain Descriptors / Indicators: Discomfort, Grimacing Pain Intervention(s): Monitored during session, Premedicated before session    Home Living                          Prior Function            PT Goals (current goals can now be found in the care plan section) Acute Rehab PT Goals PT Goal Formulation: With patient Time For Goal Achievement: 12/04/22 Potential to Achieve Goals: Good Progress towards PT goals: Progressing toward goals    Frequency    Min 1X/week      PT Plan      Co-evaluation              AM-PAC PT "6 Clicks" Mobility   Outcome Measure  Help needed turning from your back to your side while in a flat bed without using bedrails?: A Little Help needed moving from lying on your back to sitting on the side of a flat bed without using bedrails?: A Little Help needed moving to and from a bed to a chair (including a wheelchair)?: A Little Help needed standing up from a chair using your arms (e.g., wheelchair or bedside chair)?: Total Help needed to walk in hospital room?: Total Help needed climbing 3-5 steps with a railing? : Total 6 Click Score: 12    End of Session Equipment Utilized During Treatment: Oxygen Activity Tolerance: Patient tolerated treatment well;Patient limited by fatigue Patient left: with call bell/phone within reach;in chair;with chair alarm set Nurse Communication: Mobility status PT Visit Diagnosis: Muscle weakness (generalized) (M62.81);Pain Pain - Right/Left: Left (bil) Pain - part of body: Ankle and joints of foot;Leg     Time: 1610-9604 PT Time Calculation (min) (ACUTE ONLY): 24 min  Charges:    $Therapeutic Activity: 23-37 mins PT General Charges $$ ACUTE PT VISIT: 1 Visit                     Lewis Shock, PT, DPT Acute Rehabilitation Services Secure chat preferred Office #: [phone]    Iona Hansen 11/23/2022, 12:14 PM

## 2022-11-23 NOTE — Progress Notes (Signed)
Inpatient Rehab Admissions Coordinator:    I met with pt. To discuss potential CIR admit again. He remains interested. Worked with therapies this AM and transferred to the chair. I will have rehab MD consult on Pt. And send case to insurance.  Megan Salon, MS, CCC-SLP Rehab Admissions Coordinator  [phone] (celll) [phone] (office)

## 2022-11-23 NOTE — Plan of Care (Signed)
  Problem: Clinical Measurements: Goal: Will remain free from infection Outcome: Progressing Goal: Diagnostic test results will improve Outcome: Progressing Goal: Respiratory complications will improve Outcome: Progressing   Problem: Safety: Goal: Ability to remain free from injury will improve Outcome: Progressing

## 2022-11-23 NOTE — Progress Notes (Signed)
   11/23/22 1021  Spiritual Encounters  Type of Visit Initial  Care provided to: Patient   Chaplain attempted to visit but Pt was deep asleep and did not respond to my knock.  Chaplain will follow up again later.  Chaplain services remain available by Spiritual Consult or for emergent cases, paging [phone]  Chaplain Raelene Bott, MDiv [email or web address] [phone]

## 2022-11-23 NOTE — Progress Notes (Signed)
PROGRESS NOTE  Corey [name]  ZOX:[id number] DOB: 1970-12-06 DOA: 10/27/2022 PCP: [name], NP   Brief Narrative: Patient is a 52 year old male with history of ESRD on dialysis on TTS schedule, calciphylaxis, paroxysmal A-fib on warfarin, type B aortic dissection, peripheral artery disease, chronic lower extremity edema, malnutrition, blindness, nonambulatory status who presented with shortness of breath, left shoulder, chest/flank pain, missed 2 dialysis sessions.  He was also hypoxic on presentation.  CT chest/abdomen/pelvis showed moderate to large loculated left-sided pleural effusion with lower lobe consolidation.  Admitted for further management of acute hypoxic respiratory failure.  Had chest tube placed on 7/31, required multiple rounds of lytics.  Pleural fluid culture did not show any growth.  Chest tube removed on 8/9, completed course of antibiotics.  He was found to have Found to have left heel necrosis on 8/12. Vascular surgery consulted and patient underwent left AKA on 8/15. Anticoagulation resumed with Eliquis.  PT/OT recommending CIR.  Medically stable for discharge whenever possible.  Assessment & Plan:  Principal Problem:   Acute respiratory failure with hypoxia (HCC) Active Problems:   ESRD on dialysis [facility])   Calciphylaxis   Essential hypertension   Chronic diastolic CHF (congestive heart failure) (HCC)   Atrial fibrillation, chronic (HCC)   End-stage renal disease on hemodialysis (HCC)   AAA (abdominal aortic aneurysm) without rupture (HCC)   Hyperkalemia   PVD (peripheral vascular disease) (HCC)   Constipation   Lobar pneumonia (HCC)   Parapneumonic effusion   Gangrene (HCC)   Protein-calorie malnutrition, severe   Acute hypoxic respiratory failure due to left lower lobe pneumonia, parapneumonic effusion: CT chest/abdomen/pelvis showed moderate to large loculated left-sided pleural effusion with lower lobe consolidation.  Had chest tube placed on 7/31,  required multiple rounds of lytics.  Pleural fluid culture did not show any growth.  Chest tube removed on 8/9, completed course of antibiotics.  Currently on 2 L of oxygen.  Continue to try to wean.  ESRD on dialysis: Nephrology following for dialysis.  Acute on chronic diastolic congestive heart failure: Volume management as per dialysis.  Echocardiogram showed EF of 50 to 55%.  Appears euvolemic.  Gangrene of the left heel/peripheral artery disease: Status post left AKA on 8/15 by vascular surgery.  Continue Eliquis.  Continue pain management, supportive care.  He has chronic bilateral lower extremity edema, venous stasis and calciphylaxis of lower extremity. Continue Eucerin, Sarna lotion on legs.  Also on Tylenol, gabapentin.  Podiatry also saw the patient and recommended outpatient follow-up for hypertrophic toenail.  Left subclavicular region mass: Presumed to be a sebaceous cyst.  Unchanged.  Management as per outpatient  Paroxysmal A-fib: Currently rate is controlled.  On amiodarone, Eliquis  Chronic hypertension: On midodrine therapy.  Anemia of  chronic disease: Likely secondary to ESRD.  Monitor hemoglobin intermittently.  On IV iron and aranesp  Type B thoracic aortic dissection with aneurysmal dilatation of proximal descending thoracic aorta: 4.5 centimeter in diameter.  Continue monitoring intermittently  Debility/deconditioning/wheelchair-bound: Patient is also legally blind.  PT/OT recommending CIR.  Severe protein-calorie malnutrition: Nutritionist was following   Nutrition Problem: Severe Malnutrition Etiology: chronic illness (ESRD, CHF, PVD)    DVT prophylaxis:apixaban (ELIQUIS) tablet 2.5 mg Start: 11/20/22 1000 SCD's Start: 11/17/22 1239 apixaban (ELIQUIS) tablet 2.5 mg     Code Status: Full Code  Family Communication:   Patient status:Inpatient  Patient is from :Home  Anticipated discharge to:CIR  Estimated DC date:whenever possible   Consultants:  PCCM, vascular surgery, podiatry  Procedures: Chest tube insertion, left AKA  Antimicrobials:  Anti-infectives (From admission, onward)    Start     Dose/Rate Route Frequency Ordered Stop   11/17/22 0915  ceFAZolin (ANCEF) IVPB 2g/100 mL premix        2 g 200 mL/hr over 30 Minutes Intravenous  Once 11/17/22 0905 11/17/22 0934   11/17/22 0910  ceFAZolin (ANCEF) 2-4 GM/100ML-% IVPB       Note to Pharmacy: Samuella Cota O: cabinet override      11/17/22 0910 11/17/22 0945   11/11/22 2200  amoxicillin-clavulanate (AUGMENTIN) 500-125 MG per tablet 1 tablet        1 tablet Oral Daily at bedtime 11/11/22 1316 11/16/22 2202   11/08/22 1800  cefTRIAXone (ROCEPHIN) 2 g in sodium chloride 0.9 % 100 mL IVPB  Status:  Discontinued        2 g 200 mL/hr over 30 Minutes Intravenous Every 24 hours 11/08/22 1656 11/11/22 1316   11/03/22 0915  cefTRIAXone (ROCEPHIN) 2 g in sodium chloride 0.9 % 100 mL IVPB        2 g 200 mL/hr over 30 Minutes Intravenous Every 24 hours 11/03/22 0823 11/08/22 1701   11/03/22 0915  azithromycin (ZITHROMAX) 500 mg in sodium chloride 0.9 % 250 mL IVPB        500 mg 250 mL/hr over 60 Minutes Intravenous Every 24 hours 11/03/22 0823 11/08/22 1701       Subjective: Patient seen and examined at the bedside today.  Appears very weak and deconditioned, lying in her bed.  Complains of pain on the left lower extremity.  Denies any shortness of breath or cough.  On 2 L of oxygen per minute  Objective: Vitals:   11/22/22 1934 11/23/22 0032 11/23/22 0318 11/23/22 0753  BP: 100/65 (!) 84/52 102/68 90/66  Pulse: 65 69 63 64  Resp: 16  16 17   Temp: 98.1 F (36.7 C)  98.4 F (36.9 C) 98.6 F (37 C)  TempSrc: Oral  Oral Oral  SpO2: 96%  100% 98%  Weight:      Height:       No intake or output data in the 24 hours ending 11/23/22 0854 Filed Weights   11/19/22 0845 11/19/22 1142 11/22/22 0755  Weight: 55.7 kg 54.5 kg 55.5 kg    Examination:  General exam: Overall  comfortable, not in distress, thin built, deconditioned, weak HEENT: Legally blind Respiratory system:  no wheezes or crackles  Cardiovascular system: S1 & S2 heard, RRR.  Dialysis catheter on the right chest.  Pansystolic murmur heard Gastrointestinal system: Abdomen is nondistended, soft and nontender. Central nervous system: Alert and oriented Extremities: Left AKA with staples Skin: No rashes, no ulcers,no icterus    Data Reviewed: I have personally reviewed following labs and imaging studies  CBC: Recent Labs  Lab 11/17/22 0039 11/17/22 0921 11/18/22 0059 11/19/22 0339 11/20/22 0047  WBC 9.3  --  12.0* 11.8* 11.0*  HGB 9.6* 10.9* 9.2* 8.5* 8.3*  HCT 33.0* 32.0* 31.2* 28.6* 28.3*  MCV 94.8  --  96.6 92.9 94.0  PLT 146*  --  189 155 149*   Basic Metabolic Panel: Recent Labs  Lab 11/17/22 0921 11/17/22 0937 11/18/22 0059 11/19/22 0339  NA 138 138 137 135  K 4.3 4.3 4.6 3.8  CL 100 101 100 94*  CO2  --  26 24 28   GLUCOSE 86 86 142* 93  BUN 33* 33* 42* 27*  CREATININE 5.80* 5.59* 6.32* 4.03*  CALCIUM  --  9.0 8.4* 8.7*  MG  --   --  2.0 1.9  PHOS  --   --  4.1 3.6     No results found for this or any previous visit (from the past 240 hour(s)).   Radiology Studies: No results found.  Scheduled Meds:  acetaminophen  650 mg Oral Q6H WA   amiodarone  200 mg Oral Daily   apixaban  2.5 mg Oral BID   Chlorhexidine Gluconate Cloth  6 each Topical Q0600   cinacalcet  90 mg Oral Q supper   darbepoetin (ARANESP) injection - DIALYSIS  40 mcg Subcutaneous Q Sat-1800   docusate sodium  100 mg Oral Daily   feeding supplement  237 mL Oral BID BM   gabapentin  100 mg Oral BID   hydrocerin   Topical Daily   lidocaine  3 patch Transdermal Q24H   midodrine  15 mg Oral TID WC   multivitamin  1 tablet Oral QHS   polyethylene glycol  17 g Oral BID   senna-docusate  1 tablet Oral BID   sevelamer carbonate  4.8 g Oral TID WC   Continuous Infusions:  anticoagulant sodium  citrate 100 mL/hr at 11/18/22 1400     LOS: 27 days   Burnadette Pop, MD [facility] P8/21/2024, 8:54 AM

## 2022-11-24 DIAGNOSIS — J9601 Acute respiratory failure with hypoxia: Secondary | ICD-10-CM | POA: Diagnosis not present

## 2022-11-24 MED ORDER — ALBUMIN HUMAN 25 % IV SOLN
25.0000 g | Freq: Once | INTRAVENOUS | Status: DC
  Filled 2022-11-24: qty 100

## 2022-11-24 MED ORDER — ANTICOAGULANT SODIUM CITRATE 4% (200MG/5ML) IV SOLN
5.0000 mL | Status: DC | PRN
  Administered 2022-11-24: 5 mL
  Filled 2022-11-24: qty 5

## 2022-11-24 NOTE — Progress Notes (Signed)
[facility] NEPHROLOGY PROGRESS NOTE  Subjective:  Seen in HD, BP's soft, not pulling fluid. No c/o's.   Objective Vital signs in last 24 hours: Vitals:   11/19/22 1130 11/19/22 1133 11/19/22 1142 11/19/22 1155  BP:  (!) 113/90  128/70  Pulse: 66 67  66  Resp: 12 16  18   Temp:  98 F (36.7 C)  98 F (36.7 C)  TempSrc:  Oral    SpO2: 100% 99%  100%  Weight:   54.5 kg   Height:       Physical Exam: General:NAD, comfortable Heart:normal rate, no rub Lungs:clear b/l, no crackle Abdomen:soft, Non-tender, non-distended Extremities:No edema, left AKA wrapped Dialysis Access: TDC     OP HD: TTS [facility] 4h  2/2 bath  66kg   TDC   no heparin (allergy)  lock TDC w/ citrate - No ESA or VDRA - Binder: Renvela pwdr 4.8g TID - Sensipar 180mg  every day at home    Assessment/ Plan # Acute hypoxic respiratory failure: In setting of missed HD, pna w/ effusion. CXR with pleural effusion s/p chest tube 7/31 now removed. Resolved.  # PNA/ L parapneumonic pleural effusion: s/p thoracentesis with cultures. Got IV abx course and finished with augmentin  #Left leg wound - s/p AKA 8/15  # ESRD: maintain TTS dialysis schedule. Time shortened to 3hrs. HD today.   #Heparin allergy - use citrate for cath lock.  #Hypotension/ volume: LE edema resolved.  Is on midodrine. Looked dry this past weekend and BP's were soft so we are keeping even w/ HD this week  #Anemia of ESRD:  Hgb 9-10s - s/p Aranesp on 7/27.  CTM.  # Secondary HPTH: Ca/Phos ok - switched to sevelamer tabs at 2400 tid per pharm recs.  Held cinacalcet due to hypoCa but corrects near normal when accounting for albumin. Have resumed sensipar at 1/2 dose (90mg  daily).   #A-fib: On amiodarone + warfarin, has IVC filter  # H/o type B aortic dissection s/p repair (2004)   # dispo - awaiting rehab facility or CIR   Rob Corleen Otwell  MD  [facility] 11/24/2022, 1:10 PM  Recent Labs  Lab 11/18/22 0059 11/19/22 0339  11/20/22 0047  HGB 9.2* 8.5* 8.3*  ALBUMIN 2.6* 2.5*  --   CALCIUM 8.4* 8.7*  --   PHOS 4.1 3.6  --   CREATININE 6.32* 4.03*  --   K 4.6 3.8  --     Inpatient medications:  acetaminophen  650 mg Oral Q6H WA   amiodarone  200 mg Oral Daily   apixaban  2.5 mg Oral BID   Chlorhexidine Gluconate Cloth  6 each Topical Q0600   Chlorhexidine Gluconate Cloth  6 each Topical Q0600   cinacalcet  90 mg Oral Q supper   darbepoetin (ARANESP) injection - DIALYSIS  40 mcg Subcutaneous Q Sat-1800   docusate sodium  100 mg Oral Daily   feeding supplement  237 mL Oral BID BM   gabapentin  100 mg Oral BID   hydrocerin   Topical Daily   lidocaine  3 patch Transdermal Q24H   midodrine  15 mg Oral TID WC   multivitamin  1 tablet Oral QHS   polyethylene glycol  17 g Oral BID   senna-docusate  1 tablet Oral BID   sevelamer carbonate  4.8 g Oral TID WC    albumin human     anticoagulant sodium citrate 100 mL/hr at 11/18/22 1400   anticoagulant sodium citrate, calcium carbonate (  dosed in mg elemental calcium), camphor-menthol **AND** hydrOXYzine, docusate sodium, HYDROmorphone (DILAUDID) injection, methocarbamol, ondansetron **OR** ondansetron (ZOFRAN) IV, oxyCODONE, phenol, sorbitol, zolpidem

## 2022-11-24 NOTE — Progress Notes (Signed)
IP rehab admissions - A peer to peer discussion with attending MD and medical director has been offered by insurance carrier.  I have given this information to Dr. Renford Dills.  [phone]

## 2022-11-24 NOTE — Progress Notes (Signed)
PROGRESS NOTE  Corey [name]  RUE:[id number] DOB: 30-Dec-1970 DOA: 10/27/2022 PCP: [name], NP   Brief Narrative: Patient is a 52 year old male with history of ESRD on dialysis on TTS schedule, calciphylaxis, paroxysmal A-fib on warfarin, type B aortic dissection, peripheral artery disease, chronic lower extremity edema, malnutrition, blindness, nonambulatory status who presented with shortness of breath, left shoulder, chest/flank pain, missed 2 dialysis sessions.  He was also hypoxic on presentation.  CT chest/abdomen/pelvis showed moderate to large loculated left-sided pleural effusion with lower lobe consolidation.  Admitted for further management of acute hypoxic respiratory failure.  Had chest tube placed on 7/31, required multiple rounds of lytics.  Pleural fluid culture did not show any growth.  Chest tube removed on 8/9, completed course of antibiotics.  He was found to have Found to have left heel necrosis on 8/12. Vascular surgery consulted and patient underwent left AKA on 8/15. Anticoagulation resumed with Eliquis.  PT/OT recommending CIR.  Medically stable for discharge whenever possible.  Assessment & Plan:  Principal Problem:   Acute respiratory failure with hypoxia (HCC) Active Problems:   ESRD on dialysis [facility])   Calciphylaxis   Essential hypertension   Chronic diastolic CHF (congestive heart failure) (HCC)   Atrial fibrillation, chronic (HCC)   End-stage renal disease on hemodialysis (HCC)   AAA (abdominal aortic aneurysm) without rupture (HCC)   Hyperkalemia   PVD (peripheral vascular disease) (HCC)   Constipation   Lobar pneumonia (HCC)   Parapneumonic effusion   Gangrene (HCC)   Protein-calorie malnutrition, severe   Acute hypoxic respiratory failure due to left lower lobe pneumonia, parapneumonic effusion: CT chest/abdomen/pelvis showed moderate to large loculated left-sided pleural effusion with lower lobe consolidation.  Had chest tube placed on 7/31,  required multiple rounds of lytics.  Pleural fluid culture did not show any growth.  Chest tube removed on 8/9, completed course of antibiotics.  Currently on 2 L of oxygen.  Continue to try to wean.  ESRD on dialysis: Nephrology following for dialysis.  Acute on chronic diastolic congestive heart failure: Volume management as per dialysis.  Echocardiogram showed EF of 50 to 55%.  Appears euvolemic.  Gangrene of the left heel/peripheral artery disease: Status post left AKA on 8/15 by vascular surgery.  Continue Eliquis.  Continue pain management, supportive care.  He has chronic bilateral lower extremity edema, venous stasis and calciphylaxis of lower extremity. Continue Eucerin, Sarna lotion on legs.  Also on Tylenol, gabapentin.  Podiatry also saw the patient and recommended outpatient follow-up for hypertrophic toenail.  Left subclavicular region mass: Presumed to be a sebaceous cyst.  Unchanged.  Management as per outpatient  Paroxysmal A-fib: Currently rate is controlled.  On amiodarone, Eliquis  Chronic hypertension: On midodrine therapy.  Anemia of  chronic disease: Likely secondary to ESRD.  Monitor hemoglobin intermittently.Given  IV iron and aranesp  Type B thoracic aortic dissection with aneurysmal dilatation of proximal descending thoracic aorta: 4.5 centimeter in diameter.  Continue monitoring intermittently  Debility/deconditioning/wheelchair-bound: Patient is also legally blind.  PT/OT recommending CIR.  Severe protein-calorie malnutrition: Nutritionist was following   Nutrition Problem: Severe Malnutrition Etiology: chronic illness (ESRD, CHF, PVD)    DVT prophylaxis:apixaban (ELIQUIS) tablet 2.5 mg Start: 11/20/22 1000 SCD's Start: 11/17/22 1239 apixaban (ELIQUIS) tablet 2.5 mg     Code Status: Full Code  Family Communication: None at bedside  Patient status:Inpatient  Patient is from :Home  Anticipated discharge to:CIR  Estimated DC date:whenever  possible   Consultants: PCCM, vascular surgery, podiatry  Procedures: Chest tube insertion, left AKA  Antimicrobials:  Anti-infectives (From admission, onward)    Start     Dose/Rate Route Frequency Ordered Stop   11/17/22 0915  ceFAZolin (ANCEF) IVPB 2g/100 mL premix        2 g 200 mL/hr over 30 Minutes Intravenous  Once 11/17/22 0905 11/17/22 0934   11/17/22 0910  ceFAZolin (ANCEF) 2-4 GM/100ML-% IVPB       Note to Pharmacy: Samuella Cota O: cabinet override      11/17/22 0910 11/17/22 0945   11/11/22 2200  amoxicillin-clavulanate (AUGMENTIN) 500-125 MG per tablet 1 tablet        1 tablet Oral Daily at bedtime 11/11/22 1316 11/16/22 2202   11/08/22 1800  cefTRIAXone (ROCEPHIN) 2 g in sodium chloride 0.9 % 100 mL IVPB  Status:  Discontinued        2 g 200 mL/hr over 30 Minutes Intravenous Every 24 hours 11/08/22 1656 11/11/22 1316   11/03/22 0915  cefTRIAXone (ROCEPHIN) 2 g in sodium chloride 0.9 % 100 mL IVPB        2 g 200 mL/hr over 30 Minutes Intravenous Every 24 hours 11/03/22 0823 11/08/22 1701   11/03/22 0915  azithromycin (ZITHROMAX) 500 mg in sodium chloride 0.9 % 250 mL IVPB        500 mg 250 mL/hr over 60 Minutes Intravenous Every 24 hours 11/03/22 0823 11/08/22 1701       Subjective: Patient seen and examined at bedside today.  Hemodynamically stable.  Lying in bed.  He was in dialysis suite.  Complains of some pain on the medicine site.  Otherwise remains comfortable.  Any shortness of breath Objective: Vitals:   11/24/22 1000 11/24/22 1030 11/24/22 1100 11/24/22 1130  BP: 105/69 114/72 97/68 116/67  Pulse: 64 62 60 (!) 59  Resp: 20 15 16 19   Temp:      TempSrc:      SpO2: 97% 100% 100% 99%  Weight:      Height:       No intake or output data in the 24 hours ending 11/24/22 1139 Filed Weights   11/19/22 1142 11/22/22 0755 11/24/22 0833  Weight: 54.5 kg 55.5 kg 59.8 kg    Examination:   General exam: Overall comfortable, not in distress, thin  built, deconditioned, weak HEENT: Legally blind Respiratory system:  no wheezes or crackles  Cardiovascular system: S1 & S2 heard, RRR.  Dialysis cath on the right chest, pansystolic murmur Gastrointestinal system: Abdomen is nondistended, soft and nontender. Central nervous system: Alert and oriented Extremities: Left AKA with staples Skin: No rashes, no ulcers,no icterus    Data Reviewed: I have personally reviewed following labs and imaging studies  CBC: Recent Labs  Lab 11/18/22 0059 11/19/22 0339 11/20/22 0047  WBC 12.0* 11.8* 11.0*  HGB 9.2* 8.5* 8.3*  HCT 31.2* 28.6* 28.3*  MCV 96.6 92.9 94.0  PLT 189 155 149*   Basic Metabolic Panel: Recent Labs  Lab 11/18/22 0059 11/19/22 0339  NA 137 135  K 4.6 3.8  CL 100 94*  CO2 24 28  GLUCOSE 142* 93  BUN 42* 27*  CREATININE 6.32* 4.03*  CALCIUM 8.4* 8.7*  MG 2.0 1.9  PHOS 4.1 3.6     No results found for this or any previous visit (from the past 240 hour(s)).   Radiology Studies: No results found.  Scheduled Meds:  acetaminophen  650 mg Oral Q6H WA   amiodarone  200 mg Oral Daily  apixaban  2.5 mg Oral BID   Chlorhexidine Gluconate Cloth  6 each Topical Q0600   Chlorhexidine Gluconate Cloth  6 each Topical Q0600   cinacalcet  90 mg Oral Q supper   darbepoetin (ARANESP) injection - DIALYSIS  40 mcg Subcutaneous Q Sat-1800   docusate sodium  100 mg Oral Daily   feeding supplement  237 mL Oral BID BM   gabapentin  100 mg Oral BID   hydrocerin   Topical Daily   lidocaine  3 patch Transdermal Q24H   midodrine  15 mg Oral TID WC   multivitamin  1 tablet Oral QHS   polyethylene glycol  17 g Oral BID   senna-docusate  1 tablet Oral BID   sevelamer carbonate  4.8 g Oral TID WC   Continuous Infusions:  albumin human     anticoagulant sodium citrate 100 mL/hr at 11/18/22 1400   anticoagulant sodium citrate       LOS: 28 days   Burnadette Pop, MD [facility] P8/22/2024, 11:39 AM

## 2022-11-24 NOTE — Plan of Care (Signed)

## 2022-11-25 DIAGNOSIS — J9601 Acute respiratory failure with hypoxia: Secondary | ICD-10-CM | POA: Diagnosis not present

## 2022-11-25 MED ORDER — CHLORHEXIDINE GLUCONATE CLOTH 2 % EX PADS: 6.0000 | MEDICATED_PAD | Freq: Every day | CUTANEOUS | Status: DC

## 2022-11-25 MED ORDER — OXYCODONE HCL 5 MG PO TABS
10.0000 mg | ORAL_TABLET | Freq: Four times a day (QID) | ORAL | Status: DC | PRN
  Administered 2022-11-25 – 2022-11-26 (×4): 10 mg via ORAL
  Filled 2022-11-25 (×5): qty 2

## 2022-11-25 NOTE — [place] FL2 (Signed)
[place] MEDICAID FL2 LEVEL OF CARE FORM     IDENTIFICATION  Patient Name: Corey [name] Birthdate: Dec 28, 1970 Sex: male Admission Date (Current Location): 10/27/2022  [facility] and [place] Number:  Producer, television/film/video and Address:  [facility] [place], [address] [place], [place] [address]      Provider Number: [id number]  Attending Physician Name and Address:  Burnadette Pop, MD  Relative Name and Phone Number:       Current Level of Care: Hospital Recommended Level of Care: Skilled Nursing Facility Prior Approval Number:    Date Approved/Denied:   PASRR Number: [id number]  Discharge Plan: SNF    Current Diagnoses: Patient Active Problem List   Diagnosis Date Noted   Protein-calorie malnutrition, severe 11/16/2022   Acute respiratory failure with hypoxia (HCC) 11/14/2022   Gangrene (HCC) 11/14/2022   Parapneumonic effusion 11/04/2022   Lobar pneumonia (HCC) 11/03/2022   PVD (peripheral vascular disease) (HCC) 11/02/2022   Constipation 11/02/2022   Hyperkalemia 10/27/2022   AAA (abdominal aortic aneurysm) without rupture (HCC) 12/18/2020   Dissection of thoracic aorta (HCC)    Essential hypertension    Chronic diastolic CHF (congestive heart failure) (HCC)    Atrial fibrillation, chronic (HCC)    End-stage renal disease on hemodialysis (HCC)    Thrombocytopenia (HCC)    Peripheral neuropathy    Thoracoabdominal aortic aneurysm (TAAA) without rupture (HCC)    Chest pain 01/18/2017   Long term (current) use of anticoagulants [Z79.01] 12/30/2016   Calciphylaxis 05/06/2016   Arm wound, left, sequela 04/28/2016   Nonhealing surgical wound 04/13/2016   ESRD on dialysis (HCC) 02/19/2016    Orientation RESPIRATION BLADDER Height & Weight     Self, Time, Situation, Place  O2 ([place] 2) Continent Weight: 131 lb 13.4 oz (59.8 kg) Height:  6\' 3"  (190.5 cm)  BEHAVIORAL SYMPTOMS/MOOD NEUROLOGICAL BOWEL NUTRITION STATUS      Continent Diet (See DC  summary)  AMBULATORY STATUS COMMUNICATION OF NEEDS Skin   Extensive Assist Verbally Surgical wounds (L chest Incision, L AKA)                       Personal Care Assistance Level of Assistance  Bathing, Feeding, Dressing Bathing Assistance: Maximum assistance Feeding assistance: Independent Dressing Assistance: Maximum assistance     Functional Limitations Info  Sight, Hearing, Speech Sight Info: Impaired (Blind/ low vision) Hearing Info: Adequate Speech Info: Adequate    SPECIAL CARE FACTORS FREQUENCY  PT (By licensed PT), OT (By licensed OT)     PT Frequency: 5x week OT Frequency: 5x week            Contractures Contractures Info: Not present    Additional Factors Info  Code Status, Allergies Code Status Info: Full Allergies Info: Ciprofloxacin  Heparin  Doxercalciferol  Quinolones           Current Medications (11/25/2022):  This is the current hospital active medication list Current Facility-Administered Medications  Medication Dose Route Frequency Provider Last Rate Last Admin   acetaminophen (TYLENOL) tablet 650 mg  650 mg Oral Q6H WA Candelaria Stagers T, MD   650 mg at 11/25/22 1253   albumin human 25 % solution 25 g  25 g Intravenous Once Delano Metz, MD       amiodarone (PACERONE) tablet 200 mg  200 mg Oral Daily Rhyne, Samantha J, PA-C   200 mg at 11/25/22 0936   anticoagulant sodium citrate solution 5 mL  5  mL Intracatheter Continuous PRN Dara Lords, PA-C 100 mL/hr at 11/18/22 1400 5 mL at 11/19/22 1217   apixaban (ELIQUIS) tablet 2.5 mg  2.5 mg Oral BID Candelaria Stagers T, MD   2.5 mg at 11/25/22 6644   calcium carbonate (dosed in mg elemental calcium) suspension 500 mg of elemental calcium  500 mg of elemental calcium Oral Q6H PRN Rhyne, Samantha J, PA-C       camphor-menthol (SARNA) lotion 1 Application  1 Application Topical Q8H PRN Rhyne, Samantha J, PA-C       And   hydrOXYzine (ATARAX) tablet 25 mg  25 mg Oral Q8H PRN Rhyne, Samantha J, PA-C        Chlorhexidine Gluconate Cloth 2 % PADS 6 each  6 each Topical Q0600 Dara Lords, PA-C   6 each at 11/25/22 0347   Chlorhexidine Gluconate Cloth 2 % PADS 6 each  6 each Topical Q0600 Delano Metz, MD   6 each at 11/25/22 [phone]   cinacalcet (SENSIPAR) tablet 90 mg  90 mg Oral Q supper Delano Metz, MD   90 mg at 11/24/22 1728   Darbepoetin Alfa (ARANESP) injection 40 mcg  40 mcg Subcutaneous Q Sat-1800 Rhyne, Ames Coupe, PA-C   40 mcg at 11/19/22 2203   docusate sodium (COLACE) capsule 100 mg  100 mg Oral Daily Dara Lords, PA-C   100 mg at 11/25/22 5638   docusate sodium (ENEMEEZ) enema 283 mg  1 enema Rectal PRN Rhyne, Samantha J, PA-C       feeding supplement (ENSURE ENLIVE / ENSURE PLUS) liquid 237 mL  237 mL Oral BID BM Alanda Slim, Taye T, MD   237 mL at 11/25/22 0937   gabapentin (NEURONTIN) capsule 100 mg  100 mg Oral BID Candelaria Stagers T, MD   100 mg at 11/25/22 7564   hydrocerin (EUCERIN) cream   Topical Daily Dara Lords, PA-C   Given at 11/25/22 3329   HYDROmorphone (DILAUDID) injection 0.5 mg  0.5 mg Intravenous Q3H PRN Candelaria Stagers T, MD   0.5 mg at 11/25/22 0937   lidocaine (LIDODERM) 5 % 3 patch  3 patch Transdermal Q24H Dara Lords, PA-C   3 patch at 11/24/22 1813   methocarbamol (ROBAXIN) tablet 500 mg  500 mg Oral Q6H PRN Burnadette Pop, MD   500 mg at 11/25/22 0648   midodrine (PROAMATINE) tablet 15 mg  15 mg Oral TID WC Rhyne, Samantha J, PA-C   15 mg at 11/25/22 1252   multivitamin (RENA-VIT) tablet 1 tablet  1 tablet Oral QHS Rhyne, Ames Coupe, PA-C   1 tablet at 11/24/22 2125   ondansetron (ZOFRAN) tablet 4 mg  4 mg Oral Q6H PRN Rhyne, Samantha J, PA-C       Or   ondansetron (ZOFRAN) injection 4 mg  4 mg Intravenous Q6H PRN Rhyne, Samantha J, PA-C   4 mg at 11/11/22 1309   oxyCODONE (Oxy IR/ROXICODONE) immediate release tablet 10 mg  10 mg Oral Q6H PRN Burnadette Pop, MD   10 mg at 11/25/22 1252   phenol (CHLORASEPTIC) mouth spray 1 spray  1 spray  Mouth/Throat PRN Rhyne, Samantha J, PA-C       polyethylene glycol (MIRALAX / GLYCOLAX) packet 17 g  17 g Oral BID Rhyne, Samantha J, PA-C   17 g at 11/25/22 5188   senna-docusate (Senokot-S) tablet 1 tablet  1 tablet Oral BID Dara Lords, PA-C   1 tablet at 11/25/22 0937   sevelamer  carbonate (RENVELA) powder PACK 4.8 g  4.8 g Oral TID WC Gonfa, Taye T, MD   4.8 g at 11/25/22 0800   sorbitol 70 % solution 30 mL  30 mL Oral PRN Rhyne, Samantha J, PA-C       zolpidem (AMBIEN) tablet 5 mg  5 mg Oral QHS PRN Rhyne, Samantha J, PA-C   5 mg at 11/24/22 2128     Discharge Medications: Please see discharge summary for a list of discharge medications.  Relevant Imaging Results:  Relevant Lab Results:   Additional Information SS# [phone] [place]

## 2022-11-25 NOTE — Progress Notes (Signed)
Occupational Therapy Treatment Patient Details Name: Corey [name] MRN: [id number] DOB: 1971-03-17 Today's Date: 11/25/2022   History of present illness Pt is a 52 y.o. male admitted 10/27/22 with SOB, L shoulder and chest pain missing 2x HD sessions. CT showed mod-large loculated L pleural effusion with lower lobe consolidation. S/p chest tube placement 7/31; removed 8/9. Found to have L heel necrosis 8/12. S/p L AKA 8/15. PMH includes ESRD (HD TTS), PAF on Warfarin, type B aortic dissection, PAD, chronic LE edema, severe malnutrition, blindness.   OT comments  OT educated pt in techniques for increased safety and independence with ADLS and functional transfers with session with pt participating well. Pt currently demonstrates ability to complete UB ADLs with Mod I, LB ADLs Contact guard to Mod assist, and lateral scoot transfers with Contact guard to Min assist. Pt making consistent progress toward goals. Pt progress with LB ADLs has been slow secondary to pain in residual limb and mental health challenges post L AKA. However, pt is motivated to continue with skilled rehab services and demonstrates good participation in skilled OT sessions. Pt will benefit from continued acute skilled OT services to address deficits outlined below and increase safety and independence with functional tasks. Post acute discharge, pt will benefit from intensive inpatient skilled rehab services > 3 hours per day to maximize rehab potential.       If plan is discharge home, recommend the following:  A little help with walking and/or transfers;A little help with bathing/dressing/bathroom;Assistance with cooking/housework;Direct supervision/assist for medications management;Direct supervision/assist for financial management;Assist for transportation;Help with stairs or ramp for entrance   Equipment Recommendations  Other (comment) (defer to next level of care)    Recommendations for Other Services Rehab consult     Precautions / Restrictions Precautions Precautions: Fall;Other (comment) Precaution Comments: s/p L AKA 11/17/22; watch soft BP; low vision Restrictions Weight Bearing Restrictions: Yes LLE Weight Bearing: Non weight bearing Other Position/Activity Restrictions: offload R heel       Mobility Bed Mobility Overal bed mobility: Needs Assistance Bed Mobility: Supine to Sit     Supine to sit: Min assist, HOB elevated, Used rails     General bed mobility comments: Increased time with pt reporting briefly feeling "loopy" due to recent medicaiton in sitting with VSS    Transfers Overall transfer level: Needs assistance Equipment used: None Transfers: Bed to chair/wheelchair/BSC            Lateral/Scoot Transfers: Min assist, +2 physical assistance General transfer comment: Increased time and +2 for safety due to pt reporting lightheadedness from recent medication and pt with signs of increased anxiety with transfers. Pt with decreased anxiety following orientation to environment secondary to low vision.     Balance Overall balance assessment: Needs assistance Sitting-balance support: Single extremity supported, Bilateral upper extremity supported, Feet supported Sitting balance-Leahy Scale: Fair Sitting balance - Comments: sitting EOB and sitting in recliner                                   ADL either performed or assessed with clinical judgement   ADL Overall ADL's : Needs assistance/impaired Eating/Feeding: Modified independent;Sitting   Grooming: Modified independent;Sitting   Upper Body Bathing: Modified independent;Sitting   Lower Body Bathing: Contact guard assist;Sitting/lateral leans;Cueing for compensatory techniques   Upper Body Dressing : Modified independent;Sitting   Lower Body Dressing: Moderate assistance;Sitting/lateral leans   Toilet Transfer: Minimal assistance;Requires drop arm;BSC/3in1 (  lateral scoot transfer)   Toileting-  Clothing Manipulation and Hygiene: Minimal assistance;Sitting/lateral lean         General ADL Comments: Pt with decreased activity tolerance. Pt requires orientation to environment when items are moved in his room secondary to low vision.    Extremity/Trunk Assessment Upper Extremity Assessment Upper Extremity Assessment: Generalized weakness   Lower Extremity Assessment Lower Extremity Assessment: Defer to PT evaluation        Vision       Perception     Praxis      Cognition Arousal: Alert Behavior During Therapy: Flat affect Overall Cognitive Status: Within Functional Limits for tasks assessed                                 General Comments: Pt AAOx4 and pleasant throughout session.        Exercises      Shoulder Instructions       General Comments VSS on 2L continuous O2 through nasal cannula throughout session.    Pertinent Vitals/ Pain       Pain Assessment Pain Assessment: Faces Faces Pain Scale: Hurts even more Pain Location: L residual limb and R foot with mobility Pain Descriptors / Indicators: Aching, Dull, Discomfort, Grimacing Pain Intervention(s): Limited activity within patient's tolerance  Home Living                                          Prior Functioning/Environment              Frequency  Min 1X/week        Progress Toward Goals  OT Goals(current goals can now be found in the care plan section)  Progress towards OT goals: Progressing toward goals  Acute Rehab OT Goals Patient Stated Goal: To get better and have more rehab OT Goal Formulation: With patient Time For Goal Achievement: 12/09/22 Potential to Achieve Goals: Good  Plan      Co-evaluation                 AM-PAC OT "6 Clicks" Daily Activity     Outcome Measure   Help from another person eating meals?: None (orientation due to low vision) Help from another person taking care of personal grooming?: None  (orientation due to low vision) Help from another person toileting, which includes using toliet, bedpan, or urinal?: A Little Help from another person bathing (including washing, rinsing, drying)?: A Little Help from another person to put on and taking off regular upper body clothing?: A Little Help from another person to put on and taking off regular lower body clothing?: A Lot 6 Click Score: 19    End of Session Equipment Utilized During Treatment: Oxygen  OT Visit Diagnosis: Unsteadiness on feet (R26.81);Other abnormalities of gait and mobility (R26.89);Muscle weakness (generalized) (M62.81);Adult, failure to thrive (R62.7)   Activity Tolerance Patient tolerated treatment well   Patient Left in chair;with call bell/phone within reach;with chair alarm set   Nurse Communication Mobility status;Other (comment) (Pt with signs of increased anxiety with transfers with orientation to environment helpful for pt)        Time: 9563-8756 OT Time Calculation (min): 34 min  Charges: OT General Charges $OT Visit: 1 Visit OT Treatments $Self Care/Home Management : 23-37 mins  Haydee Jabbour "Kyle" M., OTR/L, MA Acute Rehab  [phone]   Lendon Colonel 11/25/2022, 7:02 PM

## 2022-11-25 NOTE — Plan of Care (Signed)
  Problem: Education: Goal: Knowledge of General Education information will improve Description: Including pain rating scale, medication(s)/side effects and non-pharmacologic comfort measures Outcome: Progressing   Problem: Health Behavior/Discharge Planning: Goal: Ability to manage health-related needs will improve Outcome: Progressing   Problem: Clinical Measurements: Goal: Will remain free from infection Outcome: Progressing   Problem: Activity: Goal: Risk for activity intolerance will decrease Outcome: Progressing   Problem: Elimination: Goal: Will not experience complications related to bowel motility Outcome: Progressing   Problem: Pain Managment: Goal: General experience of comfort will improve Outcome: Progressing   Problem: Skin Integrity: Goal: Risk for impaired skin integrity will decrease Outcome: Progressing

## 2022-11-25 NOTE — Plan of Care (Signed)
?  Problem: Coping: ?Goal: Level of anxiety will decrease ?Outcome: Progressing ?  ?Problem: Safety: ?Goal: Ability to remain free from injury will improve ?Outcome: Progressing ?  ?

## 2022-11-25 NOTE — Progress Notes (Signed)
Physical Therapy Treatment Patient Details Name: Corey [name] MRN: [id number] DOB: 1970/09/13 Today's Date: 11/25/2022   History of Present Illness Pt is a 52 y.o. male admitted 10/27/22 with SOB, L shoulder and chest pain missing 2x HD sessions. CT showed mod-large loculated L pleural effusion with lower lobe consolidation. S/p chest tube placement 7/31; removed 8/9. Found to have L heel necrosis 8/12. S/p L AKA 8/15. PMH includes ESRD (HD TTS), PAF on Warfarin, type B aortic dissection, PAD, chronic LE edema, severe malnutrition, blindness.    PT Comments  Pt received in supine and agreeable to session. Pt reporting feeling like he is "floating" due to recent medication. Pt able to sit EOB with min A for balance due to one posterior LOB while scooting forward at EOB. Pt participating in a lateral scoot transfer with min A to elevate bottom due to increased fatigue and weakness. Pt requires increased time and a rest break to complete transfer, however is motivated to improve. Pt continues to be appropriate for intensive inpatient follow up therapy, >3 hours/day to maximize progress towards functional mobility goals and return to prior level of functioning. Pt continues to benefit from PT services to progress toward functional mobility goals.     If plan is discharge home, recommend the following: A little help with walking and/or transfers;A little help with bathing/dressing/bathroom;Assistance with cooking/housework;Assist for transportation;Help with stairs or ramp for entrance   Can travel by private vehicle        Equipment Recommendations  Wheelchair (measurements PT);Wheelchair cushion (measurements PT);Rolling walker (2 wheels)    Recommendations for Other Services       Precautions / Restrictions Precautions Precautions: Fall;Other (comment) Precaution Comments: s/p L AKA 11/17/22; watch soft BP Restrictions Weight Bearing Restrictions: Yes LLE Weight Bearing: Non weight  bearing Other Position/Activity Restrictions: offload R heel     Mobility  Bed Mobility Overal bed mobility: Needs Assistance Bed Mobility: Supine to Sit     Supine to sit: Min assist, HOB elevated, Used rails     General bed mobility comments: Increased time and min A for balance due to one posterior LOB.    Transfers Overall transfer level: Needs assistance Equipment used: None Transfers: Bed to chair/wheelchair/BSC            Lateral/Scoot Transfers: Min assist, +2 safety/equipment General transfer comment: Increased time and min A to elevate bottom with bedpad due to fatigue. +2 for safety due to pt reporting lightheadedness from recent medication. pt demonstrating small, short scoots.       Balance Overall balance assessment: Needs assistance Sitting-balance support: Feet supported, Bilateral upper extremity supported Sitting balance-Leahy Scale: Fair Sitting balance - Comments: sitting EOB                                    Cognition Arousal: Alert Behavior During Therapy: Flat affect Overall Cognitive Status: Within Functional Limits for tasks assessed                                          Exercises General Exercises - Lower Extremity Quad Sets: AROM, Seated, Right, 5 reps    General Comments        Pertinent Vitals/Pain Pain Assessment Pain Assessment: Faces Faces Pain Scale: Hurts even more Pain Location: L residual limb and R foot  with mobility Pain Descriptors / Indicators: Aching, Dull, Discomfort, Grimacing Pain Intervention(s): Monitored during session, Limited activity within patient's tolerance, Repositioned     PT Goals (current goals can now be found in the care plan section) Acute Rehab PT Goals Patient Stated Goal: post-acute rehab at [facility], regain independence PT Goal Formulation: With patient Time For Goal Achievement: 12/04/22 Potential to Achieve Goals: Good Progress towards PT goals:  Progressing toward goals    Frequency    Min 1X/week      PT Plan         AM-PAC PT "6 Clicks" Mobility   Outcome Measure  Help needed turning from your back to your side while in a flat bed without using bedrails?: A Little Help needed moving from lying on your back to sitting on the side of a flat bed without using bedrails?: A Little Help needed moving to and from a bed to a chair (including a wheelchair)?: A Little Help needed standing up from a chair using your arms (e.g., wheelchair or bedside chair)?: Total Help needed to walk in hospital room?: Total Help needed climbing 3-5 steps with a railing? : Total 6 Click Score: 12    End of Session Equipment Utilized During Treatment: Oxygen Activity Tolerance: Patient tolerated treatment well;Patient limited by fatigue Patient left: with call bell/phone within reach;in chair;with chair alarm set Nurse Communication: Mobility status PT Visit Diagnosis: Muscle weakness (generalized) (M62.81);Pain     Time: 5643-3295 PT Time Calculation (min) (ACUTE ONLY): 24 min  Charges:    $Therapeutic Activity: 23-37 mins PT General Charges $$ ACUTE PT VISIT: 1 Visit                     Johny Shock, PTA Acute Rehabilitation Services Secure Chat Preferred  [phone]    Johny Shock 11/25/2022, 3:22 PM

## 2022-11-25 NOTE — Progress Notes (Signed)
[facility]PHROLOGY PROGRESS NOTE  Subjective:  Seen in room, says they are having insurance issues. No c/o's.   Objective Vital signs in last 24 hours: Vitals:   11/19/22 1130 11/19/22 1133 11/19/22 1142 11/19/22 1155  BP:  (!) 113/90  128/70  Pulse: 66 67  66  Resp: 12 16  18   Temp:  98 F (36.7 C)  98 F (36.7 C)  TempSrc:  Oral    SpO2: 100% 99%  100%  Weight:   54.5 kg   Height:       Physical Exam: General:NAD, comfortable Heart:normal rate, no rub Lungs:clear b/l, no crackle Abdomen:soft, Non-tender, non-distended Extremities:No edema, left AKA wrapped Dialysis Access: TDC     OP HD: TTS [facility] 4h  2/2 bath  66kg   TDC   no heparin (allergy)  lock TDC w/ citrate - No ESA or VDRA - Binder: Renvela pwdr 4.8g TID - Sensipar 180mg  every day at home    Assessment/ Plan # Acute hypoxic respiratory failure: In setting of missed HD, pna w/ effusion. CXR with pleural effusion s/p chest tube 7/31 now removed. Resolved.  # PNA/ L parapneumonic pleural effusion: s/p thoracentesis with cultures. Got IV abx course and finished with augmentin  #Left leg wound - s/p AKA 8/15  # ESRD: maintain TTS dialysis schedule. HD today.   #Heparin allergy - use citrate for cath lock.  #Hypotension/ volume: LE edema resolved.  Is on midodrine. Looked dry this past weekend and BP's were soft so we kept even and wts are up to 59kg.  1-2 L UF next HD.   #Anemia of ESRD:  Hgb 9-10s - s/p Aranesp on 7/27.  CTM.  # Secondary HPTH: Ca/Phos ok - switched to sevelamer tabs at 2400 tid per pharm recs.  Held cinacalcet due to hypoCa but corrects near normal when accounting for albumin. Have resumed sensipar at 1/2 dose (90mg  daily).   #A-fib: On amiodarone + warfarin, has IVC filter  # H/o type B aortic dissection s/p repair (2004)   # dispo - awaiting rehab facility or CIR   Corey Moselle  MD  [facility] 11/25/2022, 2:08 PM  Recent Labs  Lab 11/19/22 0339 11/20/22 0047   HGB 8.5* 8.3*  ALBUMIN 2.5*  --   CALCIUM 8.7*  --   PHOS 3.6  --   CREATININE 4.03*  --   K 3.8  --     Inpatient medications:  acetaminophen  650 mg Oral Q6H WA   amiodarone  200 mg Oral Daily   apixaban  2.5 mg Oral BID   Chlorhexidine Gluconate Cloth  6 each Topical Q0600   Chlorhexidine Gluconate Cloth  6 each Topical Q0600   cinacalcet  90 mg Oral Q supper   darbepoetin (ARANESP) injection - DIALYSIS  40 mcg Subcutaneous Q Sat-1800   docusate sodium  100 mg Oral Daily   feeding supplement  237 mL Oral BID BM   gabapentin  100 mg Oral BID   hydrocerin   Topical Daily   lidocaine  3 patch Transdermal Q24H   midodrine  15 mg Oral TID WC   multivitamin  1 tablet Oral QHS   polyethylene glycol  17 g Oral BID   senna-docusate  1 tablet Oral BID   sevelamer carbonate  4.8 g Oral TID WC    albumin human     anticoagulant sodium citrate 100 mL/hr at 11/18/22 1400   anticoagulant sodium citrate, calcium carbonate (dosed in mg  elemental calcium), camphor-menthol **AND** hydrOXYzine, docusate sodium, HYDROmorphone (DILAUDID) injection, methocarbamol, ondansetron **OR** ondansetron (ZOFRAN) IV, oxyCODONE, phenol, sorbitol, zolpidem

## 2022-11-25 NOTE — Progress Notes (Signed)
Pt receives out-pt HD at [facility] on TTS 11:45 am chair time. Will assist as needed.   Olivia Canter Renal Navigator [phone]

## 2022-11-25 NOTE — TOC Progression Note (Signed)
Transition of Care [facility]) - Progression Note    Patient Details  Name: Corey [name] MRN: [id number] Date of Birth: [birth year]  Transition of Care [facility] CM/SW Contact  Carley Hammed, LCSW Phone Number: 11/25/2022, 1:35 PM  Clinical Narrative:    CSW notified that insurance has denied for CIR, but pt plans to appeal. CSW spoke with pt regarding SNF placement as a back up. Pt states he is agreeable to SNF, but requests we contact his Earnest Rosier to go over it. CSW discussed Medicare. Gov information with pt and he stated that someone else would need to review that for him. CSW called [facility] and left a VM. CSW to fax pt out for bed offers as a backup, pending CIR insurance Appeal. TOC will continue to follow for DC needs.    Expected Discharge Plan: Home w Home Health Services Barriers to Discharge: Continued Medical Work up  Expected Discharge Plan and Services       Living arrangements for the past 2 months: Single Family Home                                       Social Determinants of Health (SDOH) Interventions SDOH Screenings   Food Insecurity: No Food Insecurity (10/27/2022)  Housing: Medium Risk (10/27/2022)  Transportation Needs: No Transportation Needs (10/27/2022)  Utilities: Not At Risk (10/27/2022)  Alcohol Screen: Low Risk  (09/26/2022)  Depression (PHQ2-9): Medium Risk (09/26/2022)  Financial Resource Strain: Medium Risk (09/26/2022)  Physical Activity: Inactive (09/26/2022)  Social Connections: Socially Isolated (09/26/2022)  Stress: No Stress Concern Present (09/26/2022)  Tobacco Use: Low Risk  (11/17/2022)    Readmission Risk Interventions     No data to display

## 2022-11-25 NOTE — Progress Notes (Addendum)
Inpatient Rehab Admissions Coordinator:  Saw pt at bedside. Informed him that insurance denied authorization for CIR. Pt would like to do an expedited appeal. Will begin appeal once receive denial fax from insurance company. Will continue to follow.   1537: Expedited appeal started.   Wolfgang Phoenix, MS, CCC-SLP Admissions Coordinator [phone]

## 2022-11-25 NOTE — Progress Notes (Signed)
PROGRESS NOTE  Corey [name]  XBM:[id number] DOB: [birth year] DOA: 10/27/2022 PCP: [name], NP   Brief Narrative: Patient is a 52 year old male with history of ESRD on dialysis on TTS schedule, calciphylaxis, paroxysmal A-fib on warfarin, type B aortic dissection, peripheral artery disease, chronic lower extremity edema, malnutrition, blindness, nonambulatory status who presented with shortness of breath, left shoulder, chest/flank pain, missed 2 dialysis sessions.  He was also hypoxic on presentation.  CT chest/abdomen/pelvis showed moderate to large loculated left-sided pleural effusion with lower lobe consolidation.  Admitted for further management of acute hypoxic respiratory failure.  Had chest tube placed on 7/31, required multiple rounds of lytics.  Pleural fluid culture did not show any growth.  Chest tube removed on 8/9, completed course of antibiotics.  He was found to have Found to have left heel necrosis on 8/12. Vascular surgery consulted and patient underwent left AKA on 8/15. Anticoagulation resumed with Eliquis.  PT/OT recommending CIR. his insurance  declined CIR, now the plan is for SNF.  TOC following .medically stable for discharge whenever possible.  Assessment & Plan:  Principal Problem:   Acute respiratory failure with hypoxia (HCC) Active Problems:   ESRD on dialysis [facility])   Calciphylaxis   Essential hypertension   Chronic diastolic CHF (congestive heart failure) (HCC)   Atrial fibrillation, chronic (HCC)   End-stage renal disease on hemodialysis (HCC)   AAA (abdominal aortic aneurysm) without rupture (HCC)   Hyperkalemia   PVD (peripheral vascular disease) (HCC)   Constipation   Lobar pneumonia (HCC)   Parapneumonic effusion   Gangrene (HCC)   Protein-calorie malnutrition, severe   Acute hypoxic respiratory failure due to left lower lobe pneumonia, parapneumonic effusion: CT chest/abdomen/pelvis showed moderate to large loculated left-sided pleural  effusion with lower lobe consolidation.  Had chest tube placed on 7/31, required multiple rounds of lytics.  Pleural fluid culture did not show any growth.  Chest tube removed on 8/9, completed course of antibiotics.  Currently on 2 L of oxygen.  Continue to try to wean.  ESRD on dialysis: Nephrology following for dialysis.  Acute on chronic diastolic congestive heart failure: Volume management as per dialysis.  Echocardiogram showed EF of 50 to 55%.  Appears euvolemic.  Gangrene of the left heel/peripheral artery disease: Status post left AKA on 8/15 by vascular surgery.  Continue Eliquis.  Continue pain management, supportive care.  He has chronic bilateral lower extremity edema, venous stasis and calciphylaxis of lower extremity. Continue Eucerin, Sarna lotion on legs.  Also on Tylenol, gabapentin.  Podiatry also saw the patient and recommended outpatient follow-up for hypertrophic toenail.  Left subclavicular region mass: Presumed to be a sebaceous cyst.  Unchanged.  Management as per outpatient  Paroxysmal A-fib: Currently rate is controlled.  On amiodarone, Eliquis  Chronic hypertension: On midodrine therapy.  Anemia of  chronic disease: Likely secondary to ESRD.  Monitor hemoglobin intermittently.Given  IV iron and aranesp  Type B thoracic aortic dissection with aneurysmal dilatation of proximal descending thoracic aorta: 4.5 centimeter in diameter.  Continue monitoring intermittently  Debility/deconditioning/wheelchair-bound: Patient is also legally blind.  PT/OT recommending CIR. Insurance  declined, now the plan is for SNF  Severe protein-calorie malnutrition: Nutritionist was following   Nutrition Problem: Severe Malnutrition Etiology: chronic illness (ESRD, CHF, PVD)    DVT prophylaxis:apixaban (ELIQUIS) tablet 2.5 mg Start: 11/20/22 1000 SCD's Start: 11/17/22 1239 apixaban (ELIQUIS) tablet 2.5 mg     Code Status: Full Code  Family Communication: None at  bedside  Patient  status:Inpatient  Patient is from :Home  Anticipated discharge to:SNF  Estimated DC date:whenever possible   Consultants: PCCM, vascular surgery, podiatry  Procedures: Chest tube insertion, left AKA  Antimicrobials:  Anti-infectives (From admission, onward)    Start     Dose/Rate Route Frequency Ordered Stop   11/17/22 0915  ceFAZolin (ANCEF) IVPB 2g/100 mL premix        2 g 200 mL/hr over 30 Minutes Intravenous  Once 11/17/22 0905 11/17/22 0934   11/17/22 0910  ceFAZolin (ANCEF) 2-4 GM/100ML-% IVPB       Note to Pharmacy: Samuella Cota O: cabinet override      11/17/22 0910 11/17/22 0945   11/11/22 2200  amoxicillin-clavulanate (AUGMENTIN) 500-125 MG per tablet 1 tablet        1 tablet Oral Daily at bedtime 11/11/22 1316 11/16/22 2202   11/08/22 1800  cefTRIAXone (ROCEPHIN) 2 g in sodium chloride 0.9 % 100 mL IVPB  Status:  Discontinued        2 g 200 mL/hr over 30 Minutes Intravenous Every 24 hours 11/08/22 1656 11/11/22 1316   11/03/22 0915  cefTRIAXone (ROCEPHIN) 2 g in sodium chloride 0.9 % 100 mL IVPB        2 g 200 mL/hr over 30 Minutes Intravenous Every 24 hours 11/03/22 0823 11/08/22 1701   11/03/22 0915  azithromycin (ZITHROMAX) 500 mg in sodium chloride 0.9 % 250 mL IVPB        500 mg 250 mL/hr over 60 Minutes Intravenous Every 24 hours 11/03/22 0823 11/08/22 1701       Subjective: Patient seen and examined at bedside today.  He remains comfortable.  Complains of some pain on the left AKA .  Had a bowel movement today  objective: Vitals:   11/24/22 1534 11/24/22 1600 11/24/22 1944 11/25/22 0711  BP: 97/65 94/63 90/62  98/75  Pulse:   74   Resp: 19  16 18   Temp: 98.2 F (36.8 C)  98.3 F (36.8 C) 98.3 F (36.8 C)  TempSrc: Oral  Oral Oral  SpO2: 96%  97% 97%  Weight:      Height:       No intake or output data in the 24 hours ending 11/25/22 1157 Filed Weights   11/19/22 1142 11/22/22 0755 11/24/22 0833  Weight: 54.5 kg 55.5 kg  59.8 kg    Examination:   General exam: Overall comfortable, not in distress, thin built, deconditioned, appears chronically ill HEENT: Legally blind Respiratory system:  no wheezes or crackles  Cardiovascular system: S1 & S2 heard, RRR.  Dialysis catheter on the right chest, pansystolic murmur Gastrointestinal system: Abdomen is nondistended, soft and nontender. Central nervous system: Alert and oriented Extremities: Left AKA with staples Skin: No rashes, no ulcers,no icterus    Data Reviewed: I have personally reviewed following labs and imaging studies  CBC: Recent Labs  Lab 11/19/22 0339 11/20/22 0047  WBC 11.8* 11.0*  HGB 8.5* 8.3*  HCT 28.6* 28.3*  MCV 92.9 94.0  PLT 155 149*   Basic Metabolic Panel: Recent Labs  Lab 11/19/22 0339  NA 135  K 3.8  CL 94*  CO2 28  GLUCOSE 93  BUN 27*  CREATININE 4.03*  CALCIUM 8.7*  MG 1.9  PHOS 3.6     No results found for this or any previous visit (from the past 240 hour(s)).   Radiology Studies: No results found.  Scheduled Meds:  acetaminophen  650 mg Oral Q6H WA   amiodarone  200 mg  Oral Daily   apixaban  2.5 mg Oral BID   Chlorhexidine Gluconate Cloth  6 each Topical Q0600   Chlorhexidine Gluconate Cloth  6 each Topical Q0600   cinacalcet  90 mg Oral Q supper   darbepoetin (ARANESP) injection - DIALYSIS  40 mcg Subcutaneous Q Sat-1800   docusate sodium  100 mg Oral Daily   feeding supplement  237 mL Oral BID BM   gabapentin  100 mg Oral BID   hydrocerin   Topical Daily   lidocaine  3 patch Transdermal Q24H   midodrine  15 mg Oral TID WC   multivitamin  1 tablet Oral QHS   polyethylene glycol  17 g Oral BID   senna-docusate  1 tablet Oral BID   sevelamer carbonate  4.8 g Oral TID WC   Continuous Infusions:  albumin human     anticoagulant sodium citrate 100 mL/hr at 11/18/22 1400     LOS: 29 days   Burnadette Pop, MD [facility] P8/23/2024, 11:57 AM

## 2022-11-26 DIAGNOSIS — J9601 Acute respiratory failure with hypoxia: Secondary | ICD-10-CM | POA: Diagnosis not present

## 2022-11-26 LAB — BASIC METABOLIC PANEL
Anion gap: 18 — ABNORMAL HIGH (ref 5–15)
BUN: 31 mg/dL — ABNORMAL HIGH (ref 6–20)
CO2: 21 mmol/L — ABNORMAL LOW (ref 22–32)
Calcium: 6.6 mg/dL — ABNORMAL LOW (ref 8.9–10.3)
Chloride: 96 mmol/L — ABNORMAL LOW (ref 98–111)
Creatinine, Ser: 4.93 mg/dL — ABNORMAL HIGH (ref 0.61–1.24)
GFR, Estimated: 13 mL/min — ABNORMAL LOW (ref >60)
Glucose, Bld: 88 mg/dL (ref 70–99)
Potassium: 4.3 mmol/L (ref 3.5–5.1)
Sodium: 135 mmol/L (ref 135–145)

## 2022-11-26 LAB — CBC
HCT: 29.2 % — ABNORMAL LOW (ref 39.0–52.0)
Hemoglobin: 8.4 g/dL — ABNORMAL LOW (ref 13.0–17.0)
MCH: 27.3 pg (ref 26.0–34.0)
MCHC: 28.8 g/dL — ABNORMAL LOW (ref 30.0–36.0)
MCV: 94.8 fL (ref 80.0–100.0)
Platelets: 266 10*3/uL (ref 150–400)
RBC: 3.08 MIL/uL — ABNORMAL LOW (ref 4.22–5.81)
RDW: 16.5 % — ABNORMAL HIGH (ref 11.5–15.5)
WBC: 9.7 10*3/uL (ref 4.0–10.5)
nRBC: 0.2 % (ref 0.0–0.2)

## 2022-11-26 LAB — HEPATITIS B SURFACE ANTIGEN: Hepatitis B Surface Ag: NONREACTIVE

## 2022-11-26 MED ORDER — ANTICOAGULANT SODIUM CITRATE 4% (200MG/5ML) IV SOLN
5.0000 mL | Status: DC | PRN
  Administered 2022-11-26: 3.8 mL
  Filled 2022-11-26: qty 5

## 2022-11-26 NOTE — Progress Notes (Signed)
   11/26/22 1300  Vitals  Temp 97.8 F (36.6 C)  Pulse Rate 94  Resp (!) 26  BP 121/75  SpO2 97 %  O2 Device Nasal Cannula  Weight  (unable to obtain)  Type of Weight Post-Dialysis  Oxygen Therapy  O2 Flow Rate (L/min) 2 L/min  Patient Activity (if Appropriate) In bed  Pulse Oximetry Type Continuous  Oximetry Probe Site Changed No  Post Treatment  Dialyzer Clearance Lightly streaked  Hemodialysis Intake (mL) 0 mL  Liters Processed 73.8  Fluid Removed (mL) 1000 mL  Tolerated HD Treatment Yes   Received patient in bed to unit.  Alert and oriented.  Informed consent signed and in chart.   TX duration:3.30  Patient tolerated well.  Transported back to the room  Alert, without acute distress.  Hand-off given to patient's nurse.   Access used: [facility] Access issues: lines reversed at the onset of tx---350 to 400  Total UF removed: 1000 Medication(s) given: Na Citrate $% to bilateral ports Oxy 10mg  po x 1   [facility]

## 2022-11-26 NOTE — Progress Notes (Signed)
[facility] NEPHROLOGY PROGRESS NOTE  Subjective:  Seen in HD , no c/o's.   Objective Vital signs in last 24 hours: Vitals:   11/19/22 1130 11/19/22 1133 11/19/22 1142 11/19/22 1155  BP:  (!) 113/90  128/70  Pulse: 66 67  66  Resp: 12 16  18   Temp:  98 F (36.7 C)  98 F (36.7 C)  TempSrc:  Oral    SpO2: 100% 99%  100%  Weight:   54.5 kg   Height:       Physical Exam: General:NAD, comfortable Heart:normal rate, no rub Lungs:clear b/l, no crackle Abdomen:soft, Non-tender, non-distended Extremities:No edema, left AKA wrapped Dialysis Access: TDC     OP HD: TTS [facility] 4h  2/2 bath  66kg   TDC   no heparin (allergy)  lock TDC w/ citrate - No ESA or VDRA - Binder: Renvela pwdr 4.8g TID - Sensipar 180mg  every day at home    Assessment/ Plan # Acute hypoxic respiratory failure: In setting of missed HD, pna w/ effusion. CXR with pleural effusion s/p chest tube 7/31 now removed. Resolved.  # PNA/ L parapneumonic pleural effusion: s/p thoracentesis with cultures. Got IV abx course and finished with augmentin  #Left leg wound - s/p AKA 8/15  # ESRD: maintain TTS dialysis schedule. HD today.   #Heparin allergy - use citrate for cath lock.  #Hypotension/ volume: LE edema resolved.  Is on midodrine. Looked dry last weekend and BP's were soft so we kept even and wts are up to 59kg.  1-2 L UF w/ HD today.    #Anemia of ESRD:  Hgb 9-10s - s/p Aranesp on 7/27.  CTM.  # Secondary HPTH: Ca/Phos ok - switched to sevelamer tabs at 2400 tid per pharm recs.  Held cinacalcet due to hypoCa but corrects near normal when accounting for albumin. Have resumed sensipar at 1/2 dose (90mg  daily).   #A-fib: On amiodarone + warfarin, has IVC filter  # H/o type B aortic dissection s/p repair (2004)   # dispo - awaiting rehab facility or CIR   Vinson Moselle  MD  [facility] 11/26/2022, 12:50 PM  Recent Labs  Lab 11/20/22 0047 11/26/22 0330  HGB 8.3* 8.4*  CALCIUM  --  6.6*   CREATININE  --  4.93*  K  --  4.3    Inpatient medications:  acetaminophen  650 mg Oral Q6H WA   amiodarone  200 mg Oral Daily   apixaban  2.5 mg Oral BID   Chlorhexidine Gluconate Cloth  6 each Topical Q0600   cinacalcet  90 mg Oral Q supper   darbepoetin (ARANESP) injection - DIALYSIS  40 mcg Subcutaneous Q Sat-1800   docusate sodium  100 mg Oral Daily   feeding supplement  237 mL Oral BID BM   gabapentin  100 mg Oral BID   hydrocerin   Topical Daily   lidocaine  3 patch Transdermal Q24H   midodrine  15 mg Oral TID WC   multivitamin  1 tablet Oral QHS   polyethylene glycol  17 g Oral BID   senna-docusate  1 tablet Oral BID   sevelamer carbonate  4.8 g Oral TID WC    albumin human     anticoagulant sodium citrate 100 mL/hr at 11/18/22 1400   anticoagulant sodium citrate     anticoagulant sodium citrate, anticoagulant sodium citrate, calcium carbonate (dosed in mg elemental calcium), camphor-menthol **AND** hydrOXYzine, docusate sodium, HYDROmorphone (DILAUDID) injection, methocarbamol, ondansetron **OR** ondansetron (ZOFRAN) IV,  oxyCODONE, phenol, sorbitol, zolpidem

## 2022-11-26 NOTE — Progress Notes (Signed)
PROGRESS NOTE  Corey [name]  ZOX:[id number] DOB: 12-17-1970 DOA: 10/27/2022 PCP: [name], NP   Brief Narrative: Patient is a 52 year old male with history of ESRD on dialysis on TTS schedule, calciphylaxis, paroxysmal A-fib on warfarin, type B aortic dissection, peripheral artery disease, chronic lower extremity edema, malnutrition, blindness, nonambulatory status who presented with shortness of breath, left shoulder, chest/flank pain, missed 2 dialysis sessions.  He was also hypoxic on presentation.  CT chest/abdomen/pelvis showed moderate to large loculated left-sided pleural effusion with lower lobe consolidation.  Admitted for further management of acute hypoxic respiratory failure.  Had chest tube placed on 7/31, required multiple rounds of lytics.  Pleural fluid culture did not show any growth.  Chest tube removed on 8/9, completed course of antibiotics.  He was found to have Found to have left heel necrosis on 8/12. Vascular surgery consulted and patient underwent left AKA on 8/15. Anticoagulation resumed with Eliquis.  PT/OT recommending CIR. his insurance  declined CIR, patient has appealed  TOC following .medically stable for discharge whenever possible.  Assessment & Plan:  Principal Problem:   Acute respiratory failure with hypoxia (HCC) Active Problems:   ESRD on dialysis [facility])   Calciphylaxis   Essential hypertension   Chronic diastolic CHF (congestive heart failure) (HCC)   Atrial fibrillation, chronic (HCC)   End-stage renal disease on hemodialysis (HCC)   AAA (abdominal aortic aneurysm) without rupture (HCC)   Hyperkalemia   PVD (peripheral vascular disease) (HCC)   Constipation   Lobar pneumonia (HCC)   Parapneumonic effusion   Gangrene (HCC)   Protein-calorie malnutrition, severe   Acute hypoxic respiratory failure due to left lower lobe pneumonia, parapneumonic effusion: CT chest/abdomen/pelvis showed moderate to large loculated left-sided pleural effusion  with lower lobe consolidation.  Had chest tube placed on 7/31, required multiple rounds of lytics.  Pleural fluid culture did not show any growth.  Chest tube removed on 8/9, completed course of antibiotics.  Currently on 2 L of oxygen.  Continue to try to wean.  ESRD on dialysis: Nephrology following for dialysis.  Acute on chronic diastolic congestive heart failure: Volume management as per dialysis.  Echocardiogram showed EF of 50 to 55%.  Appears euvolemic.  Gangrene of the left heel/peripheral artery disease: Status post left AKA on 8/15 by vascular surgery.  Continue Eliquis.  Continue pain management, supportive care.  He has chronic bilateral lower extremity edema, venous stasis and calciphylaxis of lower extremity. Continue Eucerin, Sarna lotion on legs.  Also on Tylenol, gabapentin.  Podiatry also saw the patient and recommended outpatient follow-up for hypertrophic toenail.  Left subclavicular region mass: Presumed to be a sebaceous cyst.  Unchanged.  Management as per outpatient  Paroxysmal A-fib: Currently rate is controlled.  On amiodarone, Eliquis  Chronic hypertension: On midodrine therapy.  Anemia of  chronic disease: Likely secondary to ESRD.  Monitor hemoglobin intermittently.Given  IV iron and aranesp  Type B thoracic aortic dissection with aneurysmal dilatation of proximal descending thoracic aorta: 4.5 centimeter in diameter.  Continue monitoring intermittently  Debility/deconditioning/wheelchair-bound: Patient is also legally blind.  PT/OT recommending CIR. Insurance  declined, now the plan is for SNF  Severe protein-calorie malnutrition: Nutritionist was following   Nutrition Problem: Severe Malnutrition Etiology: chronic illness (ESRD, CHF, PVD)    DVT prophylaxis:apixaban (ELIQUIS) tablet 2.5 mg Start: 11/20/22 1000 SCD's Start: 11/17/22 1239 apixaban (ELIQUIS) tablet 2.5 mg     Code Status: Full Code  Family Communication: None at bedside  Patient  status:Inpatient  Patient  is from :Home  Anticipated discharge to:CIR vs SNF vs Home with home heath  Estimated DC date:whenever possible   Consultants: PCCM, vascular surgery, podiatry  Procedures: Chest tube insertion, left AKA  Antimicrobials:  Anti-infectives (From admission, onward)    Start     Dose/Rate Route Frequency Ordered Stop   11/17/22 0915  ceFAZolin (ANCEF) IVPB 2g/100 mL premix        2 g 200 mL/hr over 30 Minutes Intravenous  Once 11/17/22 0905 11/17/22 0934   11/17/22 0910  ceFAZolin (ANCEF) 2-4 GM/100ML-% IVPB       Note to Pharmacy: Samuella Cota O: cabinet override      11/17/22 0910 11/17/22 0945   11/11/22 2200  amoxicillin-clavulanate (AUGMENTIN) 500-125 MG per tablet 1 tablet        1 tablet Oral Daily at bedtime 11/11/22 1316 11/16/22 2202   11/08/22 1800  cefTRIAXone (ROCEPHIN) 2 g in sodium chloride 0.9 % 100 mL IVPB  Status:  Discontinued        2 g 200 mL/hr over 30 Minutes Intravenous Every 24 hours 11/08/22 1656 11/11/22 1316   11/03/22 0915  cefTRIAXone (ROCEPHIN) 2 g in sodium chloride 0.9 % 100 mL IVPB        2 g 200 mL/hr over 30 Minutes Intravenous Every 24 hours 11/03/22 0823 11/08/22 1701   11/03/22 0915  azithromycin (ZITHROMAX) 500 mg in sodium chloride 0.9 % 250 mL IVPB        500 mg 250 mL/hr over 60 Minutes Intravenous Every 24 hours 11/03/22 0823 11/08/22 1701       Subjective: Patient seen and examined at bedside today.  He was in dialysis suite.  Appeared comfortable.  Complains of pain on the right AKA site.  objective: Vitals:   11/26/22 1033 11/26/22 1048 11/26/22 1100 11/26/22 1130  BP: 99/73 112/74 112/72 117/79  Pulse: 71 65 66 65  Resp: (!) 23 16 20  (!) 21  Temp:      TempSrc:      SpO2: 98% 94% 96% 96%  Weight:      Height:        Intake/Output Summary (Last 24 hours) at 11/26/2022 1152 Last data filed at 11/25/2022 1900 Gross per 24 hour  Intake 240 ml  Output --  Net 240 ml   Filed Weights    11/22/22 0755 11/24/22 0833 11/26/22 0845  Weight: 55.5 kg 59.8 kg 63.4 kg    Examination:     General exam: Overall comfortable, not in distress, thin build, appears deconditioned HEENT: Legally blind Respiratory system:  no wheezes or crackles  Cardiovascular system: S1 & S2 heard, RRR.  Dialysis catheter on the right chest, systolic murmur Gastrointestinal system: Abdomen is nondistended, soft and nontender. Central nervous system: Alert and oriented Extremities: Left AKA with staples Skin: No rashes, no ulcers,no icterus    Data Reviewed: I have personally reviewed following labs and imaging studies  CBC: Recent Labs  Lab 11/20/22 0047 11/26/22 0330  WBC 11.0* 9.7  HGB 8.3* 8.4*  HCT 28.3* 29.2*  MCV 94.0 94.8  PLT 149* 266   Basic Metabolic Panel: Recent Labs  Lab 11/26/22 0330  NA 135  K 4.3  CL 96*  CO2 21*  GLUCOSE 88  BUN 31*  CREATININE 4.93*  CALCIUM 6.6*     No results found for this or any previous visit (from the past 240 hour(s)).   Radiology Studies: No results found.  Scheduled Meds:  acetaminophen  650 mg  Oral Q6H WA   amiodarone  200 mg Oral Daily   apixaban  2.5 mg Oral BID   Chlorhexidine Gluconate Cloth  6 each Topical Q0600   cinacalcet  90 mg Oral Q supper   darbepoetin (ARANESP) injection - DIALYSIS  40 mcg Subcutaneous Q Sat-1800   docusate sodium  100 mg Oral Daily   feeding supplement  237 mL Oral BID BM   gabapentin  100 mg Oral BID   hydrocerin   Topical Daily   lidocaine  3 patch Transdermal Q24H   midodrine  15 mg Oral TID WC   multivitamin  1 tablet Oral QHS   polyethylene glycol  17 g Oral BID   senna-docusate  1 tablet Oral BID   sevelamer carbonate  4.8 g Oral TID WC   Continuous Infusions:  albumin human     anticoagulant sodium citrate 100 mL/hr at 11/18/22 1400   anticoagulant sodium citrate       LOS: 30 days   Burnadette Pop, MD [facility] P8/24/2024, 11:52 AM

## 2022-11-27 DIAGNOSIS — J9601 Acute respiratory failure with hypoxia: Secondary | ICD-10-CM | POA: Diagnosis not present

## 2022-11-27 MED ORDER — BISACODYL 10 MG RE SUPP
10.0000 mg | Freq: Once | RECTAL | Status: AC
  Administered 2022-11-27: 10 mg via RECTAL
  Filled 2022-11-27: qty 1

## 2022-11-27 NOTE — Progress Notes (Signed)
Corey [name]  JYN:[id number] DOB: 01/06/1971 DOA: 10/27/2022 PCP: [name], NP   Brief Narrative: Patient is a 52 year old male with history of ESRD on dialysis on TTS schedule, calciphylaxis, paroxysmal A-fib on warfarin, type B aortic dissection, peripheral artery disease, chronic lower extremity edema, malnutrition, blindness, nonambulatory status who presented with shortness of breath, left shoulder, chest/flank pain, missed 2 dialysis sessions.  He was also hypoxic on presentation.  CT chest/abdomen/pelvis showed moderate to large loculated left-sided pleural effusion with lower lobe consolidation.  Admitted for further management of acute hypoxic respiratory failure.  Had chest tube placed on 7/31, required multiple rounds of lytics.  Pleural fluid culture did not show any growth.  Chest tube removed on 8/9, completed course of antibiotics.  He was found to have Found to have left heel necrosis on 8/12. Vascular surgery consulted and patient underwent left AKA on 8/15. Anticoagulation resumed with Eliquis.  PT/OT recommending CIR. his insurance  declined CIR, patient has appealed  TOC following .medically stable for discharge whenever possible.  Assessment & Plan:  Principal Problem:   Acute respiratory failure with hypoxia (HCC) Active Problems:   ESRD on dialysis [facility])   Calciphylaxis   Essential hypertension   Chronic diastolic CHF (congestive heart failure) (HCC)   Atrial fibrillation, chronic (HCC)   End-stage renal disease on hemodialysis (HCC)   AAA (abdominal aortic aneurysm) without rupture (HCC)   Hyperkalemia   PVD (peripheral vascular disease) (HCC)   Constipation   Lobar pneumonia (HCC)   Parapneumonic effusion   Gangrene (HCC)   Protein-calorie malnutrition, severe   Acute hypoxic respiratory failure due to left lower lobe pneumonia, parapneumonic effusion: CT chest/abdomen/pelvis showed moderate to large loculated left-sided pleural effusion  with lower lobe consolidation.  Had chest tube placed on 7/31, required multiple rounds of lytics.  Pleural fluid culture did not show any growth.  Chest tube removed on 8/9, completed course of antibiotics.  Currently on 2 L of oxygen.  Continue to try to wean.  ESRD on dialysis: Nephrology following for dialysis.  Acute on chronic diastolic congestive heart failure: Volume management as per dialysis.  Echocardiogram showed EF of 50 to 55%.  Appears euvolemic.  Gangrene of the left heel/peripheral artery disease: Status post left AKA on 8/15 by vascular surgery.  Continue Eliquis.  Continue pain management, supportive care.  He has chronic bilateral lower extremity edema, venous stasis and calciphylaxis of lower extremity. Continue Eucerin, Sarna lotion on legs.  Also on Tylenol, gabapentin.  Podiatry also saw the patient and recommended outpatient follow-up for hypertrophic toenail.  Left subclavicular region mass: Presumed to be a sebaceous cyst.  Unchanged.  Management as per outpatient  Paroxysmal A-fib: Currently rate is controlled.  On amiodarone, Eliquis  Chronic hypotension: On midodrine therapy.  Anemia of  chronic disease: Likely secondary to ESRD.  Monitor hemoglobin intermittently.Given  IV iron and aranesp  Type B thoracic aortic dissection with aneurysmal dilatation of proximal descending thoracic aorta: 4.5 centimeter in diameter.  Continue monitoring intermittently  Debility/deconditioning/wheelchair-bound: Patient is also legally blind.  PT/OT recommending CIR. Insurance  declined,patient has appealed  Severe protein-calorie malnutrition: Nutritionist was following  Constipation: Continue bowel regimen   Nutrition Problem: Severe Malnutrition Etiology: chronic illness (ESRD, CHF, PVD)    DVT prophylaxis:apixaban (ELIQUIS) tablet 2.5 mg Start: 11/20/22 1000 SCD's Start: 11/17/22 1239 apixaban (ELIQUIS) tablet 2.5 mg     Code Status: Full Code  Family  Communication: None at bedside  Patient status:Inpatient  Patient is from :Home  Anticipated discharge to:CIR vs SNF vs Home with home heath  Estimated DC date:whenever possible   Consultants: PCCM, vascular surgery, podiatry  Procedures: Chest tube insertion, left AKA  Antimicrobials:  Anti-infectives (From admission, onward)    Start     Dose/Rate Route Frequency Ordered Stop   11/17/22 0915  ceFAZolin (ANCEF) IVPB 2g/100 mL premix        2 g 200 mL/hr over 30 Minutes Intravenous  Once 11/17/22 0905 11/17/22 0934   11/17/22 0910  ceFAZolin (ANCEF) 2-4 GM/100ML-% IVPB       Note to Pharmacy: Samuella Cota O: cabinet override      11/17/22 0910 11/17/22 0945   11/11/22 2200  amoxicillin-clavulanate (AUGMENTIN) 500-125 MG per tablet 1 tablet        1 tablet Oral Daily at bedtime 11/11/22 1316 11/16/22 2202   11/08/22 1800  cefTRIAXone (ROCEPHIN) 2 g in sodium chloride 0.9 % 100 mL IVPB  Status:  Discontinued        2 g 200 mL/hr over 30 Minutes Intravenous Every 24 hours 11/08/22 1656 11/11/22 1316   11/03/22 0915  cefTRIAXone (ROCEPHIN) 2 g in sodium chloride 0.9 % 100 mL IVPB        2 g 200 mL/hr over 30 Minutes Intravenous Every 24 hours 11/03/22 0823 11/08/22 1701   11/03/22 0915  azithromycin (ZITHROMAX) 500 mg in sodium chloride 0.9 % 250 mL IVPB        500 mg 250 mL/hr over 60 Minutes Intravenous Every 24 hours 11/03/22 0823 11/08/22 1701       Subjective: Patient seen and examined at bedside today.  He was lying in bed.  On 2 L of oxygen per minute.  Denies shortness of breath or cough.  Complains of abdominal discomfort today.  He had a small bowel movement yesterday.  On examination, abdomen is soft, nontender and not much distended.  We discussed about ordering Dulcolax suppository   objective: Vitals:   11/26/22 1929 11/26/22 2321 11/27/22 0308 11/27/22 0725  BP: 103/69 90/61 (!) 82/65 107/73  Pulse: 64 67 66 64  Resp: 16 16 18 15   Temp: 98.2 F (36.8 C)  98.4 F (36.9 C) 98.2 F (36.8 C) 98.5 F (36.9 C)  TempSrc: Oral Oral Oral Oral  SpO2:      Weight:      Height:        Intake/Output Summary (Last 24 hours) at 11/27/2022 1037 Last data filed at 11/26/2022 2000 Gross per 24 hour  Intake 248.8 ml  Output 1000 ml  Net -751.2 ml   Filed Weights   11/22/22 0755 11/24/22 0833 11/26/22 0845  Weight: 55.5 kg 59.8 kg 63.4 kg    Examination:    General exam: Overall comfortable, not in distress, thin built, deconditioned HEENT: Legally blind Respiratory system:  no wheezes or crackles  Cardiovascular system: S1 & S2 heard, RRR.  Dialysis catheter on the right chest, systolic murmur Gastrointestinal system: Abdomen is nondistended, soft and nontender. Central nervous system: Alert and oriented Extremities: left AKA with staples Skin: No rashes, no ulcers,no icterus    Data Reviewed: I have personally reviewed following labs and imaging studies  CBC: Recent Labs  Lab 11/26/22 0330  WBC 9.7  HGB 8.4*  HCT 29.2*  MCV 94.8  PLT 266   Basic Metabolic Panel: Recent Labs  Lab 11/26/22 0330  NA 135  K 4.3  CL 96*  CO2 21*  GLUCOSE 88  BUN  31*  CREATININE 4.93*  CALCIUM 6.6*     No results found for this or any previous visit (from the past 240 hour(s)).   Radiology Studies: No results found.  Scheduled Meds:  acetaminophen  650 mg Oral Q6H WA   amiodarone  200 mg Oral Daily   apixaban  2.5 mg Oral BID   Chlorhexidine Gluconate Cloth  6 each Topical Q0600   cinacalcet  90 mg Oral Q supper   darbepoetin (ARANESP) injection - DIALYSIS  40 mcg Subcutaneous Q Sat-1800   docusate sodium  100 mg Oral Daily   feeding supplement  237 mL Oral BID BM   gabapentin  100 mg Oral BID   hydrocerin   Topical Daily   lidocaine  3 patch Transdermal Q24H   midodrine  15 mg Oral TID WC   multivitamin  1 tablet Oral QHS   polyethylene glycol  17 g Oral BID   senna-docusate  1 tablet Oral BID   sevelamer carbonate  4.8 g Oral  TID WC   Continuous Infusions:  albumin human     anticoagulant sodium citrate 100 mL/hr at 11/18/22 1400     LOS: 31 days   Burnadette Pop, MD [facility] P8/25/2024, 10:37 AM

## 2022-11-27 NOTE — Progress Notes (Signed)
[facility] NEPHROLOGY PROGRESS NOTE  Subjective:  Seen in room, c/o stomach pains.   Objective Vital signs in last 24 hours: Vitals:   11/19/22 1130 11/19/22 1133 11/19/22 1142 11/19/22 1155  BP:  (!) 113/90  128/70  Pulse: 66 67  66  Resp: 12 16  18   Temp:  98 F (36.7 C)  98 F (36.7 C)  TempSrc:  Oral    SpO2: 100% 99%  100%  Weight:   54.5 kg   Height:       Physical Exam: General:NAD, comfortable Heart:normal rate, no rub Lungs:clear b/l, no crackle Abdomen:soft, Non-tender, non-distended Extremities:No edema, left AKA wrapped Dialysis Access: TDC     OP HD: TTS [facility] 4h  2/2 bath  66kg   TDC   no heparin (allergy)  lock TDC w/ citrate - No ESA or VDRA - Binder: Renvela pwdr 4.8g TID - Sensipar 180mg  every day at home    Assessment/ Plan # Acute hypoxic respiratory failure: In setting of missed HD, pna w/ effusion. CXR with pleural effusion s/p chest tube 7/31 now removed. Resolved.  # PNA/ L parapneumonic pleural effusion: s/p thoracentesis with cultures. Got IV abx course and finished with augmentin  #Left leg wound - s/p AKA 8/15  # ESRD: maintain TTS dialysis schedule. Next HD 8/27.   #Heparin allergy - use citrate for cath lock.  #Hypotension/ volume: LE edema resolved.  Is on midodrine. No pitting edema. 3kg under dry wt. On 2L [place]. UF as tolerated next HD.   #Anemia of ESRD:  Hgb 9-10s - s/p Aranesp on 7/27.  CTM.  # Secondary HPTH: Ca/Phos ok - switched to sevelamer tabs at 2400 tid per pharm recs.  Held cinacalcet due to hypoCa but corrects near normal when accounting for albumin. Have resumed sensipar at 1/2 dose (90mg  daily).   #A-fib: On amiodarone + warfarin, has IVC filter  # H/o type B aortic dissection s/p repair (2004)   # dispo - awaiting rehab facility or CIR   Vinson Moselle  MD  [facility] 11/27/2022, 9:48 AM  Recent Labs  Lab 11/26/22 0330  HGB 8.4*  CALCIUM 6.6*  CREATININE 4.93*  K 4.3    Inpatient  medications:  acetaminophen  650 mg Oral Q6H WA   amiodarone  200 mg Oral Daily   apixaban  2.5 mg Oral BID   bisacodyl  10 mg Rectal Once   Chlorhexidine Gluconate Cloth  6 each Topical Q0600   cinacalcet  90 mg Oral Q supper   darbepoetin (ARANESP) injection - DIALYSIS  40 mcg Subcutaneous Q Sat-1800   docusate sodium  100 mg Oral Daily   feeding supplement  237 mL Oral BID BM   gabapentin  100 mg Oral BID   hydrocerin   Topical Daily   lidocaine  3 patch Transdermal Q24H   midodrine  15 mg Oral TID WC   multivitamin  1 tablet Oral QHS   polyethylene glycol  17 g Oral BID   senna-docusate  1 tablet Oral BID   sevelamer carbonate  4.8 g Oral TID WC    albumin human     anticoagulant sodium citrate 100 mL/hr at 11/18/22 1400   anticoagulant sodium citrate, calcium carbonate (dosed in mg elemental calcium), camphor-menthol **AND** hydrOXYzine, docusate sodium, HYDROmorphone (DILAUDID) injection, methocarbamol, ondansetron **OR** ondansetron (ZOFRAN) IV, oxyCODONE, phenol, sorbitol, zolpidem

## 2022-11-28 ENCOUNTER — Inpatient Hospital Stay (HOSPITAL_COMMUNITY): Payer: 59

## 2022-11-28 DIAGNOSIS — J9601 Acute respiratory failure with hypoxia: Secondary | ICD-10-CM | POA: Diagnosis not present

## 2022-11-28 LAB — RENAL FUNCTION PANEL
Albumin: 2.4 g/dL — ABNORMAL LOW (ref 3.5–5.0)
Anion gap: 14 (ref 5–15)
BUN: 30 mg/dL — ABNORMAL HIGH (ref 6–20)
CO2: 25 mmol/L (ref 22–32)
Calcium: 6.6 mg/dL — ABNORMAL LOW (ref 8.9–10.3)
Chloride: 99 mmol/L (ref 98–111)
Creatinine, Ser: 5.19 mg/dL — ABNORMAL HIGH (ref 0.61–1.24)
GFR, Estimated: 13 mL/min — ABNORMAL LOW (ref >60)
Glucose, Bld: 100 mg/dL — ABNORMAL HIGH (ref 70–99)
Phosphorus: 2 mg/dL — ABNORMAL LOW (ref 2.5–4.6)
Potassium: 3.6 mmol/L (ref 3.5–5.1)
Sodium: 138 mmol/L (ref 135–145)

## 2022-11-28 LAB — CBC
HCT: 30 % — ABNORMAL LOW (ref 39.0–52.0)
Hemoglobin: 8.4 g/dL — ABNORMAL LOW (ref 13.0–17.0)
MCH: 26.8 pg (ref 26.0–34.0)
MCHC: 28 g/dL — ABNORMAL LOW (ref 30.0–36.0)
MCV: 95.5 fL (ref 80.0–100.0)
Platelets: 274 10*3/uL (ref 150–400)
RBC: 3.14 MIL/uL — ABNORMAL LOW (ref 4.22–5.81)
RDW: 16.6 % — ABNORMAL HIGH (ref 11.5–15.5)
WBC: 10.9 10*3/uL — ABNORMAL HIGH (ref 4.0–10.5)
nRBC: 0 % (ref 0.0–0.2)

## 2022-11-28 MED ORDER — CHLORHEXIDINE GLUCONATE CLOTH 2 % EX PADS
6.0000 | MEDICATED_PAD | Freq: Every day | CUTANEOUS | Status: DC
  Administered 2022-11-29: 6 via TOPICAL

## 2022-11-28 MED ORDER — DARBEPOETIN ALFA 100 MCG/0.5ML IJ SOSY: 100.0000 ug | PREFILLED_SYRINGE | INTRAMUSCULAR | Status: DC

## 2022-11-28 NOTE — Progress Notes (Signed)
Physical Therapy Treatment Patient Details Name: Corey [name] MRN: [id number] DOB: 08-08-1970 Today's Date: 11/28/2022   History of Present Illness Pt is a 52 y.o. male admitted 10/27/22 with SOB, L shoulder and chest pain missing 2x HD sessions. CT showed mod-large loculated L pleural effusion with lower lobe consolidation. S/p chest tube placement 7/31; removed 8/9. Found to have L heel necrosis 8/12. S/p L AKA 8/15. PMH includes ESRD (HD TTS), PAF on Warfarin, type B aortic dissection, PAD, chronic LE edema, severe malnutrition, blindness.    PT Comments  Pt with increased sitting tolerance. Geomat ordered and received. Session focused on R LE stretching and strengthening as pt reports "I'm to weak to try to stand up." Pt to greatly benefit from inpatient rehab program > 3 hrs a day to achieve safe mod I w/c level of function as pt will need to use a w/c as his primary mode of mobility at this time until he is able to get a prosthesis for L LE. Acute PT to cont to follow.    If plan is discharge home, recommend the following: A little help with walking and/or transfers;A little help with bathing/dressing/bathroom;Assistance with cooking/housework;Assist for transportation;Help with stairs or ramp for entrance   Can travel by private vehicle        Equipment Recommendations  Wheelchair (measurements PT);Wheelchair cushion (measurements PT);Rolling walker (2 wheels)    Recommendations for Other Services Rehab consult     Precautions / Restrictions Precautions Precautions: Fall;Other (comment) Precaution Comments: s/p L AKA 11/17/22; watch soft BP; low vision Restrictions Weight Bearing Restrictions: Yes LLE Weight Bearing: Non weight bearing Other Position/Activity Restrictions: offload R heel     Mobility  Bed Mobility               General bed mobility comments: pt in chair upon PT arrival    Transfers                   General transfer comment: pt deferred going  back to bed due to wanting to eat lunch in chair    Ambulation/Gait               General Gait Details: unable   Stairs             Wheelchair Mobility     Tilt Bed    Modified Rankin (Stroke Patients Only)       Balance Overall balance assessment: Needs assistance Sitting-balance support: Single extremity supported, Bilateral upper extremity supported, Feet supported Sitting balance-Leahy Scale: Fair Sitting balance - Comments: sitting in recliner, uses UEs to support self when doing R LE exercises                                    Cognition Arousal: Alert Behavior During Therapy: WFL for tasks assessed/performed Overall Cognitive Status: Within Functional Limits for tasks assessed                                 General Comments: Pt AAOx4 and pleasant throughout session.        Exercises General Exercises - Lower Extremity Ankle Circles/Pumps: AAROM, Right, 10 reps, Seated (pt's ankle extremely tight, passive heel cord stretch and gentle mobs to ankle) Long Arc Quad: AROM, Right, 10 reps, Seated (1/2 range of motion due to tight hamstrings) Other Exercises Other  Exercises: chair push ups x 10 reps, x2 sets Other Exercises: passive hamstring stretch to R LE in chair    General Comments General comments (skin integrity, edema, etc.): VSS      Pertinent Vitals/Pain Pain Assessment Pain Assessment: Faces Faces Pain Scale: Hurts little more Breathing: normal Pain Location: L residual limb, R foot, back Pain Descriptors / Indicators: Discomfort    Home Living                          Prior Function            PT Goals (current goals can now be found in the care plan section) Acute Rehab PT Goals Patient Stated Goal: get up to the 4th floor for rehab PT Goal Formulation: With patient Time For Goal Achievement: 12/04/22 Potential to Achieve Goals: Good Progress towards PT goals: Progressing toward  goals    Frequency    Min 1X/week      PT Plan      Co-evaluation              AM-PAC PT "6 Clicks" Mobility   Outcome Measure  Help needed turning from your back to your side while in a flat bed without using bedrails?: A Little Help needed moving from lying on your back to sitting on the side of a flat bed without using bedrails?: A Little Help needed moving to and from a bed to a chair (including a wheelchair)?: A Little Help needed standing up from a chair using your arms (e.g., wheelchair or bedside chair)?: Total Help needed to walk in hospital room?: Total Help needed climbing 3-5 steps with a railing? : Total 6 Click Score: 12    End of Session Equipment Utilized During Treatment: Oxygen Activity Tolerance: Patient tolerated treatment well Patient left: with call bell/phone within reach;in chair;with chair alarm set Nurse Communication: Mobility status PT Visit Diagnosis: Muscle weakness (generalized) (M62.81);Pain Pain - Right/Left: Left (bil) Pain - part of body: Ankle and joints of foot;Leg     Time: 1410-1437 PT Time Calculation (min) (ACUTE ONLY): 27 min  Charges:    $Therapeutic Exercise: 23-37 mins PT General Charges $$ ACUTE PT VISIT: 1 Visit                     Corey [name], PT, DPT Acute Rehabilitation Services Secure chat preferred Office #: [phone]    Corey [name] 11/28/2022, 2:51 PM

## 2022-11-28 NOTE — Progress Notes (Signed)
Inpatient Rehab Admissions Coordinator:   CIR following. I continue to await insurance auth. I spoke with representative at [facility] this AM and was told that appeal was received and decision likely to be rendered today vs tomorrow. I will follow for potential admit pending insurance auth.   Megan Salon, MS, CCC-SLP Rehab Admissions Coordinator  [phone] (celll) [phone] (office)

## 2022-11-28 NOTE — Progress Notes (Signed)
PROGRESS NOTE  Corey [name]  NFA:[id number] DOB: 02/08/1971 DOA: 10/27/2022 PCP: [name], NP   Brief Narrative: Patient is a 52 year old male with history of ESRD on dialysis on TTS schedule, calciphylaxis, paroxysmal A-fib on warfarin, type B aortic dissection, peripheral artery disease, chronic lower extremity edema, malnutrition, blindness, nonambulatory status who presented with shortness of breath, left shoulder, chest/flank pain, missed 2 dialysis sessions.  He was also hypoxic on presentation.  CT chest/abdomen/pelvis showed moderate to large loculated left-sided pleural effusion with lower lobe consolidation.  Admitted for further management of acute hypoxic respiratory failure.  Had chest tube placed on 7/31, required multiple rounds of lytics.  Pleural fluid culture did not show any growth.  Chest tube removed on 8/9, completed course of antibiotics.  He was found to have Found to have left heel necrosis on 8/12. Vascular surgery consulted and patient underwent left AKA on 8/15. Anticoagulation resumed with Eliquis.  PT/OT recommending CIR. his insurance  declined CIR, patient has appealed  TOC following .medically stable for discharge whenever possible.  Assessment & Plan:  Principal Problem:   Acute respiratory failure with hypoxia (HCC) Active Problems:   ESRD on dialysis [facility])   Calciphylaxis   Essential hypertension   Chronic diastolic CHF (congestive heart failure) (HCC)   Atrial fibrillation, chronic (HCC)   End-stage renal disease on hemodialysis (HCC)   AAA (abdominal aortic aneurysm) without rupture (HCC)   Hyperkalemia   PVD (peripheral vascular disease) (HCC)   Constipation   Lobar pneumonia (HCC)   Parapneumonic effusion   Gangrene (HCC)   Protein-calorie malnutrition, severe   Acute hypoxic respiratory failure due to left lower lobe pneumonia, parapneumonic effusion: CT chest/abdomen/pelvis showed moderate to large loculated left-sided pleural effusion  with lower lobe consolidation.  Had chest tube placed on 7/31, required multiple rounds of lytics.  Pleural fluid culture did not show any growth.  Chest tube removed on 8/9, completed course of antibiotics.  Currently on 2 L of oxygen.  Continue to try to wean.  Follow-up chest x-ray today.  ESRD on dialysis: Nephrology following for dialysis.  Acute on chronic diastolic congestive heart failure: Volume management as per dialysis.  Echocardiogram showed EF of 50 to 55%.  Appears euvolemic.  Gangrene of the left heel/peripheral artery disease: Status post left AKA on 8/15 by vascular surgery.  Continue Eliquis.  Continue pain management, supportive care.  He has chronic bilateral lower extremity edema, venous stasis and calciphylaxis of lower extremity. Continue Eucerin, Sarna lotion on legs.  Also on Tylenol, gabapentin.  Podiatry also saw the patient and recommended outpatient follow-up for hypertrophic toenail.  Left subclavicular region mass: Presumed to be a sebaceous cyst.  Unchanged.  Management as per outpatient  Paroxysmal A-fib: Currently rate is controlled.  On amiodarone, Eliquis  Chronic hypotension: On midodrine therapy.  Anemia of  chronic disease: Likely secondary to ESRD.  Monitor hemoglobin intermittently.Given  IV iron and aranesp  Type B thoracic aortic dissection with aneurysmal dilatation of proximal descending thoracic aorta: 4.5 centimeter in diameter.  Continue monitoring intermittently  Debility/deconditioning/wheelchair-bound: Patient is also legally blind.  PT/OT recommending CIR. Insurance  declined,patient has appealed  Severe protein-calorie malnutrition: Nutritionist was following  Constipation: Continue bowel regimen   Nutrition Problem: Severe Malnutrition Etiology: chronic illness (ESRD, CHF, PVD)    DVT prophylaxis:apixaban (ELIQUIS) tablet 2.5 mg Start: 11/20/22 1000 SCD's Start: 11/17/22 1239 apixaban (ELIQUIS) tablet 2.5 mg     Code Status:  Full Code  Family Communication: None at  bedside  Patient status:Inpatient  Patient is from :Home  Anticipated discharge to:CIR vs SNF vs Home with home heath  Estimated DC date:whenever possible   Consultants: PCCM, vascular surgery, podiatry  Procedures: Chest tube insertion, left AKA  Antimicrobials:  Anti-infectives (From admission, onward)    Start     Dose/Rate Route Frequency Ordered Stop   11/17/22 0915  ceFAZolin (ANCEF) IVPB 2g/100 mL premix        2 g 200 mL/hr over 30 Minutes Intravenous  Once 11/17/22 0905 11/17/22 0934   11/17/22 0910  ceFAZolin (ANCEF) 2-4 GM/100ML-% IVPB       Note to Pharmacy: Samuella Cota O: cabinet override      11/17/22 0910 11/17/22 0945   11/11/22 2200  amoxicillin-clavulanate (AUGMENTIN) 500-125 MG per tablet 1 tablet        1 tablet Oral Daily at bedtime 11/11/22 1316 11/16/22 2202   11/08/22 1800  cefTRIAXone (ROCEPHIN) 2 g in sodium chloride 0.9 % 100 mL IVPB  Status:  Discontinued        2 g 200 mL/hr over 30 Minutes Intravenous Every 24 hours 11/08/22 1656 11/11/22 1316   11/03/22 0915  cefTRIAXone (ROCEPHIN) 2 g in sodium chloride 0.9 % 100 mL IVPB        2 g 200 mL/hr over 30 Minutes Intravenous Every 24 hours 11/03/22 0823 11/08/22 1701   11/03/22 0915  azithromycin (ZITHROMAX) 500 mg in sodium chloride 0.9 % 250 mL IVPB        500 mg 250 mL/hr over 60 Minutes Intravenous Every 24 hours 11/03/22 0823 11/08/22 1701       Subjective: Patient seen and examined at bedside today.  Not in any kind of distress.  On 2 L of oxygen.  Denies shortness of breath or cough.  Had a multiple bowel movements yesterday.  Complains of pain on the amputation site  objective: Vitals:   11/27/22 2007 11/28/22 0027 11/28/22 0312 11/28/22 0724  BP: (!) 89/63 104/70 95/61 99/66   Pulse: 65 62 62 65  Resp: 18 16 19 19   Temp: 97.8 F (36.6 C) 98.1 F (36.7 C) 97.9 F (36.6 C) 97.8 F (36.6 C)  TempSrc: Oral Oral Oral Oral  SpO2: 95% 98%  97% 92%  Weight:      Height:        Intake/Output Summary (Last 24 hours) at 11/28/2022 1013 Last data filed at 11/28/2022 0300 Gross per 24 hour  Intake 240 ml  Output --  Net 240 ml   Filed Weights   11/22/22 0755 11/24/22 0833 11/26/22 0845  Weight: 55.5 kg 59.8 kg 63.4 kg    Examination:     General exam: Overall comfortable, not in distress,thin built, appears deconditioned HEENT: Legally blind Respiratory system:  no wheezes or crackles  Cardiovascular system: S1 & S2 heard, RRR.  Gastrointestinal system: Abdomen is nondistended, soft and nontender. Central nervous system: Alert and oriented.  Dialysis catheter on the right chest, systolic murmur Extremities: Left AKA with staples Skin: No rashes, no ulcers,no icterus      Data Reviewed: I have personally reviewed following labs and imaging studies  CBC: Recent Labs  Lab 11/26/22 0330 11/28/22 0900  WBC 9.7 10.9*  HGB 8.4* 8.4*  HCT 29.2* 30.0*  MCV 94.8 95.5  PLT 266 274   Basic Metabolic Panel: Recent Labs  Lab 11/26/22 0330  NA 135  K 4.3  CL 96*  CO2 21*  GLUCOSE 88  BUN 31*  CREATININE 4.93*  CALCIUM 6.6*     No results found for this or any previous visit (from the past 240 hour(s)).   Radiology Studies: No results found.  Scheduled Meds:  acetaminophen  650 mg Oral Q6H WA   amiodarone  200 mg Oral Daily   apixaban  2.5 mg Oral BID   Chlorhexidine Gluconate Cloth  6 each Topical Q0600   [START ON 12/03/2022] darbepoetin (ARANESP) injection - DIALYSIS  100 mcg Subcutaneous Q Sat-1800   docusate sodium  100 mg Oral Daily   feeding supplement  237 mL Oral BID BM   gabapentin  100 mg Oral BID   hydrocerin   Topical Daily   lidocaine  3 patch Transdermal Q24H   midodrine  15 mg Oral TID WC   multivitamin  1 tablet Oral QHS   polyethylene glycol  17 g Oral BID   senna-docusate  1 tablet Oral BID   sevelamer carbonate  4.8 g Oral TID WC   Continuous Infusions:  albumin human      anticoagulant sodium citrate 100 mL/hr at 11/18/22 1400     LOS: 32 days   Burnadette Pop, MD [facility] P8/26/2024, 10:13 AM

## 2022-11-28 NOTE — Plan of Care (Signed)
Problem: Education: Goal: Knowledge of General Education information will improve Description: Including pain rating scale, medication(s)/side effects and non-pharmacologic comfort measures 11/28/2022 0416 by Langley Gauss, RN Outcome: Progressing 11/28/2022 0416 by Langley Gauss, RN Outcome: Progressing   Problem: Health Behavior/Discharge Planning: Goal: Ability to manage health-related needs will improve 11/28/2022 0416 by Langley Gauss, RN Outcome: Progressing 11/28/2022 0416 by Langley Gauss, RN Outcome: Progressing   Problem: Clinical Measurements: Goal: Ability to maintain clinical measurements within normal limits will improve 11/28/2022 0416 by Langley Gauss, RN Outcome: Progressing 11/28/2022 0416 by Langley Gauss, RN Outcome: Progressing Goal: Will remain free from infection 11/28/2022 0416 by Langley Gauss, RN Outcome: Progressing 11/28/2022 0416 by Langley Gauss, RN Outcome: Progressing Goal: Diagnostic test results will improve 11/28/2022 0416 by Langley Gauss, RN Outcome: Progressing 11/28/2022 0416 by Langley Gauss, RN Outcome: Progressing Goal: Respiratory complications will improve 11/28/2022 0416 by Langley Gauss, RN Outcome: Progressing 11/28/2022 0416 by Langley Gauss, RN Outcome: Progressing Goal: Cardiovascular complication will be avoided 11/28/2022 0416 by Langley Gauss, RN Outcome: Progressing 11/28/2022 0416 by Langley Gauss, RN Outcome: Progressing   Problem: Activity: Goal: Risk for activity intolerance will decrease 11/28/2022 0416 by Langley Gauss, RN Outcome: Progressing 11/28/2022 0416 by Langley Gauss, RN Outcome: Progressing   Problem: Nutrition: Goal: Adequate nutrition will be maintained 11/28/2022 0416 by Langley Gauss, RN Outcome: Progressing 11/28/2022 0416 by Langley Gauss, RN Outcome: Progressing   Problem: Coping: Goal: Level of anxiety will decrease 11/28/2022  0416 by Langley Gauss, RN Outcome: Progressing 11/28/2022 0416 by Langley Gauss, RN Outcome: Progressing   Problem: Elimination: Goal: Will not experience complications related to bowel motility 11/28/2022 0416 by Langley Gauss, RN Outcome: Progressing 11/28/2022 0416 by Langley Gauss, RN Outcome: Progressing Goal: Will not experience complications related to urinary retention 11/28/2022 0416 by Langley Gauss, RN Outcome: Progressing 11/28/2022 0416 by Langley Gauss, RN Outcome: Progressing   Problem: Pain Managment: Goal: General experience of comfort will improve 11/28/2022 0416 by Langley Gauss, RN Outcome: Progressing 11/28/2022 0416 by Langley Gauss, RN Outcome: Progressing   Problem: Safety: Goal: Ability to remain free from injury will improve 11/28/2022 0416 by Langley Gauss, RN Outcome: Progressing 11/28/2022 0416 by Langley Gauss, RN Outcome: Progressing   Problem: Skin Integrity: Goal: Risk for impaired skin integrity will decrease 11/28/2022 0416 by Langley Gauss, RN Outcome: Progressing 11/28/2022 0416 by Langley Gauss, RN Outcome: Progressing   Problem: Education: Goal: Knowledge of the prescribed therapeutic regimen will improve 11/28/2022 0416 by Langley Gauss, RN Outcome: Progressing 11/28/2022 0416 by Langley Gauss, RN Outcome: Progressing Goal: Ability to verbalize activity precautions or restrictions will improve 11/28/2022 0416 by Langley Gauss, RN Outcome: Progressing 11/28/2022 0416 by Langley Gauss, RN Outcome: Progressing Goal: Understanding of discharge needs will improve 11/28/2022 0416 by Langley Gauss, RN Outcome: Progressing 11/28/2022 0416 by Langley Gauss, RN Outcome: Progressing   Problem: Activity: Goal: Ability to perform//tolerate increased activity and mobilize with assistive devices will improve 11/28/2022 0416 by Langley Gauss, RN Outcome: Progressing 11/28/2022  0416 by Langley Gauss, RN Outcome: Progressing   Problem: Clinical Measurements: Goal: Postoperative complications will be avoided or minimized 11/28/2022 0416 by Langley Gauss, RN Outcome: Progressing 11/28/2022 0416 by Langley Gauss, RN Outcome: Progressing   Problem: Self-Care: Goal: Ability to meet self-care needs will improve 11/28/2022  1308 by Langley Gauss, RN Outcome: Progressing 11/28/2022 0416 by Langley Gauss, RN Outcome: Progressing   Problem: Self-Concept: Goal: Ability to maintain and perform role responsibilities to the fullest extent possible will improve 11/28/2022 0416 by Langley Gauss, RN Outcome: Progressing 11/28/2022 0416 by Langley Gauss, RN Outcome: Progressing   Problem: Pain Management: Goal: Pain level will decrease with appropriate interventions 11/28/2022 0416 by Langley Gauss, RN Outcome: Progressing 11/28/2022 0416 by Langley Gauss, RN Outcome: Progressing   Problem: Skin Integrity: Goal: Demonstration of wound healing without infection will improve 11/28/2022 0416 by Langley Gauss, RN Outcome: Progressing 11/28/2022 0416 by Langley Gauss, RN Outcome: Progressing

## 2022-11-28 NOTE — Progress Notes (Signed)
[facility] NEPHROLOGY PROGRESS NOTE  Subjective:  Last HD on 8/24 with 1 kg UF.  He has been on 2 liters oxygen. He states that he is appealing his insurance re: rehab.   Review of systems:  He is a little short of breath  No chest pain Has nausea but no vomiting Some cramping with HD    Objective Vital signs in last 24 hours: Vitals:   11/19/22 1130 11/19/22 1133 11/19/22 1142 11/19/22 1155  BP:  (!) 113/90  128/70  Pulse: 66 67  66  Resp: 12 16  18   Temp:  98 F (36.7 C)  98 F (36.7 C)  TempSrc:  Oral    SpO2: 100% 99%  100%  Weight:   54.5 kg   Height:       Physical Exam:  General adult male in bed in no acute distress HEENT normocephalic atraumatic extraocular movements intact sclera anicteric Neck supple trachea midline Lungs clear to auscultation bilaterally normal work of breathing at rest on 2 liters  Heart S1S2 no rub Abdomen soft nontender nondistended Extremities no pitting edema; left AKA with staples  Psych normal mood and affect Neuro - alert and oriented x 3 provides hx and follows commands; visually impaired Access RIJ tunn catheter in place; he points to failed accesses bilateral upper extremities       OP HD: TTS [facility] 4h  2/2 bath  66kg   TDC   no heparin (allergy)  lock TDC w/ citrate - No ESA or VDRA - Binder: Renvela pwdr 4.8g TID - Sensipar 180mg  every day at home    Assessment/ Plan # Acute hypoxic respiratory failure: In setting of missed HD, pna w/ effusion. CXR with pleural effusion s/p chest tube 7/31 now removed. Resolved.  # PNA/ L parapneumonic pleural effusion: s/p thoracentesis with cultures. S/p IV abx course and finished with augmentin  #Left leg wound - s/p AKA 8/15  # ESRD: continue HD per TTS schedule   #Heparin allergy - use citrate for cath lock.  #Hypotension/ volume:  He is on midodrine. UF as tolerated with HD   #Anemia of ESRD:  on aranesp 40 mcg every Saturday - increase to 100 mcg every  Sat  # Secondary HPTH: switched to sevelamer per pharm recs.  At one point cinacalcet was held due to hypocalcemia but then resumed at 90 mg daily (half of his normal dose).  Stop cinacalcet again due to hypocalcemia. Await repeat labs.    #A-fib: On amiodarone and eliquis - per primary team; has IVC filter  # H/o type B aortic dissection s/p repair (2004)   # dispo - awaiting rehab facility or CIR per charting - he states he is appealing his insurance's decision    Recent Labs  Lab 11/26/22 0330  HGB 8.4*  CALCIUM 6.6*  CREATININE 4.93*  K 4.3    Inpatient medications:  acetaminophen  650 mg Oral Q6H WA   amiodarone  200 mg Oral Daily   apixaban  2.5 mg Oral BID   Chlorhexidine Gluconate Cloth  6 each Topical Q0600   cinacalcet  90 mg Oral Q supper   darbepoetin (ARANESP) injection - DIALYSIS  40 mcg Subcutaneous Q Sat-1800   docusate sodium  100 mg Oral Daily   feeding supplement  237 mL Oral BID BM   gabapentin  100 mg Oral BID   hydrocerin   Topical Daily   lidocaine  3 patch Transdermal Q24H   midodrine  15  mg Oral TID WC   multivitamin  1 tablet Oral QHS   polyethylene glycol  17 g Oral BID   senna-docusate  1 tablet Oral BID   sevelamer carbonate  4.8 g Oral TID WC    albumin human     anticoagulant sodium citrate 100 mL/hr at 11/18/22 1400   anticoagulant sodium citrate, calcium carbonate (dosed in mg elemental calcium), camphor-menthol **AND** hydrOXYzine, docusate sodium, HYDROmorphone (DILAUDID) injection, methocarbamol, ondansetron **OR** ondansetron (ZOFRAN) IV, oxyCODONE, phenol, sorbitol, zolpidem  Estanislado Emms, MD 6:39 AM 11/28/2022

## 2022-11-28 NOTE — Progress Notes (Signed)
Occupational Therapy Treatment Patient Details Name: Corey [name] MRN: [id number] DOB: 1971-02-08 Today's Date: 11/28/2022   History of present illness Pt is a 52 y.o. male admitted 10/27/22 with SOB, L shoulder and chest pain missing 2x HD sessions. CT showed mod-large loculated L pleural effusion with lower lobe consolidation. S/p chest tube placement 7/31; removed 8/9. Found to have L heel necrosis 8/12. S/p L AKA 8/15. PMH includes ESRD (HD TTS), PAF on Warfarin, type B aortic dissection, PAD, chronic LE edema, severe malnutrition, blindness.   OT comments  Pt progressing towards goals this session, able to transfer to chair via lateral scoot with minA, bed mildly elevated. Pt needing increased time and x2 seated breaks due to dizziness/fatigue. Pt minA for bed mobility, able to complete UB therex at bed level (with theraband anchored to bed) and while up in chair. Pt presenting with impairments listed below,will follow acutely. Patient will benefit from intensive inpatient follow up therapy, >3 hours/day to maximize safety/ind with ADLs/functional mobility.  BP supine 97/64 (76) BP after transfer to chair 96/65 (74) BP after in chair for ~2 mins with LE elevated 104/85 (92)       If plan is discharge home, recommend the following:  A little help with walking and/or transfers;A little help with bathing/dressing/bathroom;Assistance with cooking/housework;Direct supervision/assist for medications management;Direct supervision/assist for financial management;Assist for transportation;Help with stairs or ramp for entrance   Equipment Recommendations  Other (comment) (defer)    Recommendations for Other Services Rehab consult    Precautions / Restrictions Precautions Precautions: Fall;Other (comment) Precaution Comments: s/p L AKA 11/17/22; watch soft BP; low vision Restrictions Weight Bearing Restrictions: No LLE Weight Bearing: Non weight bearing Other Position/Activity Restrictions:  offload R heel       Mobility Bed Mobility Overal bed mobility: Needs Assistance Bed Mobility: Supine to Sit     Supine to sit: Min assist     General bed mobility comments: incr time secondary to dizziness, cues to scoot to EOB    Transfers Overall transfer level: Needs assistance Equipment used: None Transfers: Bed to chair/wheelchair/BSC            Lateral/Scoot Transfers: Min assist General transfer comment: offloading R heel during transfer     Balance Overall balance assessment: Needs assistance Sitting-balance support: Single extremity supported, Bilateral upper extremity supported, Feet supported Sitting balance-Leahy Scale: Fair Sitting balance - Comments: sitting EOB and sitting in recliner                                   ADL either performed or assessed with clinical judgement   ADL Overall ADL's : Needs assistance/impaired                         Toilet Transfer: Minimal assistance;Transfer board;Requires drop arm Toilet Transfer Details (indicate cue type and reason): min A for lateral scoot transfer to recliner         Functional mobility during ADLs: Minimal assistance      Extremity/Trunk Assessment Upper Extremity Assessment Upper Extremity Assessment: Generalized weakness   Lower Extremity Assessment Lower Extremity Assessment: Defer to PT evaluation        Vision   Additional Comments: legally blind at baseline, can see shadows per pt report   Perception Perception Perception: Not tested   Praxis Praxis Praxis: Not tested    Cognition Arousal: Alert Behavior During Therapy: Flat  affect Overall Cognitive Status: Within Functional Limits for tasks assessed                                 General Comments: Pt AAOx4 and pleasant throughout session.        Exercises General Exercises - Upper Extremity Shoulder Flexion: AROM, Both, 10 reps, Theraband Theraband Level (Shoulder  Flexion): Level 1 (Yellow) Shoulder ABduction: AROM, Both, 10 reps, Theraband Theraband Level (Shoulder Abduction): Level 1 (Yellow) Shoulder ADduction: AROM, Both, 10 reps, Theraband Theraband Level (Shoulder Adduction): Level 1 (Yellow) Chair Push Up: AROM, 5 reps    Shoulder Instructions       General Comments VSS on supplemental O2    Pertinent Vitals/ Pain       Pain Assessment Pain Assessment: Faces Pain Score: 4  Faces Pain Scale: Hurts little more Pain Location: L residual limb, R foot, back Pain Descriptors / Indicators: Discomfort Pain Intervention(s): Limited activity within patient's tolerance, Monitored during session, Repositioned  Home Living                                          Prior Functioning/Environment              Frequency  Min 1X/week        Progress Toward Goals  OT Goals(current goals can now be found in the care plan section)  Progress towards OT goals: Progressing toward goals  Acute Rehab OT Goals Patient Stated Goal: to go to rehab OT Goal Formulation: With patient Time For Goal Achievement: 12/09/22 Potential to Achieve Goals: Good ADL Goals Pt Will Perform Lower Body Bathing: with contact guard assist;sitting/lateral leans Pt Will Perform Lower Body Dressing: with contact guard assist;sitting/lateral leans Pt Will Transfer to Toilet: with supervision;bedside commode;squat pivot transfer Pt Will Perform Toileting - Clothing Manipulation and hygiene: with supervision;sitting/lateral leans Pt/caregiver will Perform Home Exercise Program: Increased strength;Both right and left upper extremity;With theraband;Independently;With written HEP provided  Plan      Co-evaluation                 AM-PAC OT "6 Clicks" Daily Activity     Outcome Measure   Help from another person eating meals?: None Help from another person taking care of personal grooming?: None Help from another person toileting, which  includes using toliet, bedpan, or urinal?: A Little Help from another person bathing (including washing, rinsing, drying)?: A Little Help from another person to put on and taking off regular upper body clothing?: A Little Help from another person to put on and taking off regular lower body clothing?: A Lot 6 Click Score: 19    End of Session Equipment Utilized During Treatment: Oxygen  OT Visit Diagnosis: Unsteadiness on feet (R26.81);Other abnormalities of gait and mobility (R26.89);Muscle weakness (generalized) (M62.81);Adult, failure to thrive (R62.7)   Activity Tolerance Patient tolerated treatment well   Patient Left in chair;with call bell/phone within reach;with chair alarm set   Nurse Communication Mobility status        Time: 1191-4782 OT Time Calculation (min): 26 min  Charges: OT General Charges $OT Visit: 1 Visit OT Treatments $Self Care/Home Management : 8-22 mins $Therapeutic Exercise: 8-22 mins  Carver Fila, OTD, OTR/L SecureChat Preferred Acute Rehab [phone]   Carver Fila Koonce 11/28/2022, 12:06 PM

## 2022-11-29 ENCOUNTER — Inpatient Hospital Stay (HOSPITAL_COMMUNITY)
Admission: RE | Admit: 2022-11-29 | Discharge: 2022-12-14 | DRG: 945 | Disposition: A | Discharge status: Home Health | Payer: 59 | Source: Intra-hospital | Attending: Physical Medicine and Rehabilitation | Admitting: Physical Medicine and Rehabilitation

## 2022-11-29 DIAGNOSIS — K921 Melena: Secondary | ICD-10-CM | POA: Diagnosis not present

## 2022-11-29 DIAGNOSIS — Z8679 Personal history of other diseases of the circulatory system: Secondary | ICD-10-CM

## 2022-11-29 DIAGNOSIS — N186 End stage renal disease: Secondary | ICD-10-CM | POA: Diagnosis present

## 2022-11-29 DIAGNOSIS — M1990 Unspecified osteoarthritis, unspecified site: Secondary | ICD-10-CM

## 2022-11-29 DIAGNOSIS — G8220 Paraplegia, unspecified: Secondary | ICD-10-CM | POA: Diagnosis present

## 2022-11-29 DIAGNOSIS — Z8349 Family history of other endocrine, nutritional and metabolic diseases: Secondary | ICD-10-CM

## 2022-11-29 DIAGNOSIS — Z8249 Family history of ischemic heart disease and other diseases of the circulatory system: Secondary | ICD-10-CM

## 2022-11-29 DIAGNOSIS — G6289 Other specified polyneuropathies: Secondary | ICD-10-CM | POA: Diagnosis not present

## 2022-11-29 DIAGNOSIS — I878 Other specified disorders of veins: Secondary | ICD-10-CM | POA: Diagnosis present

## 2022-11-29 DIAGNOSIS — G8929 Other chronic pain: Secondary | ICD-10-CM | POA: Diagnosis present

## 2022-11-29 DIAGNOSIS — D72829 Elevated white blood cell count, unspecified: Secondary | ICD-10-CM | POA: Diagnosis present

## 2022-11-29 DIAGNOSIS — R5381 Other malaise: Secondary | ICD-10-CM | POA: Diagnosis present

## 2022-11-29 DIAGNOSIS — Z992 Dependence on renal dialysis: Secondary | ICD-10-CM | POA: Diagnosis not present

## 2022-11-29 DIAGNOSIS — Z79899 Other long term (current) drug therapy: Secondary | ICD-10-CM

## 2022-11-29 DIAGNOSIS — Z8673 Personal history of transient ischemic attack (TIA), and cerebral infarction without residual deficits: Secondary | ICD-10-CM

## 2022-11-29 DIAGNOSIS — Z888 Allergy status to other drugs, medicaments and biological substances status: Secondary | ICD-10-CM

## 2022-11-29 DIAGNOSIS — Z681 Body mass index (BMI) 19 or less, adult: Secondary | ICD-10-CM

## 2022-11-29 DIAGNOSIS — M19042 Primary osteoarthritis, left hand: Secondary | ICD-10-CM | POA: Diagnosis present

## 2022-11-29 DIAGNOSIS — Z881 Allergy status to other antibiotic agents status: Secondary | ICD-10-CM

## 2022-11-29 DIAGNOSIS — E43 Unspecified severe protein-calorie malnutrition: Secondary | ICD-10-CM | POA: Diagnosis present

## 2022-11-29 DIAGNOSIS — J181 Lobar pneumonia, unspecified organism: Secondary | ICD-10-CM | POA: Diagnosis present

## 2022-11-29 DIAGNOSIS — H547 Unspecified visual loss: Secondary | ICD-10-CM | POA: Diagnosis present

## 2022-11-29 DIAGNOSIS — R0781 Pleurodynia: Secondary | ICD-10-CM | POA: Diagnosis present

## 2022-11-29 DIAGNOSIS — G546 Phantom limb syndrome with pain: Secondary | ICD-10-CM | POA: Diagnosis present

## 2022-11-29 DIAGNOSIS — I132 Hypertensive heart and chronic kidney disease with heart failure and with stage 5 chronic kidney disease, or end stage renal disease: Secondary | ICD-10-CM | POA: Diagnosis present

## 2022-11-29 DIAGNOSIS — G834 Cauda equina syndrome: Secondary | ICD-10-CM | POA: Diagnosis present

## 2022-11-29 DIAGNOSIS — I482 Chronic atrial fibrillation, unspecified: Secondary | ICD-10-CM | POA: Diagnosis present

## 2022-11-29 DIAGNOSIS — G548 Other nerve root and plexus disorders: Secondary | ICD-10-CM | POA: Diagnosis not present

## 2022-11-29 DIAGNOSIS — N2581 Secondary hyperparathyroidism of renal origin: Secondary | ICD-10-CM | POA: Diagnosis present

## 2022-11-29 DIAGNOSIS — K592 Neurogenic bowel, not elsewhere classified: Secondary | ICD-10-CM | POA: Diagnosis present

## 2022-11-29 DIAGNOSIS — R001 Bradycardia, unspecified: Secondary | ICD-10-CM | POA: Diagnosis not present

## 2022-11-29 DIAGNOSIS — R040 Epistaxis: Secondary | ICD-10-CM | POA: Diagnosis not present

## 2022-11-29 DIAGNOSIS — M545 Low back pain, unspecified: Secondary | ICD-10-CM | POA: Diagnosis present

## 2022-11-29 DIAGNOSIS — I739 Peripheral vascular disease, unspecified: Secondary | ICD-10-CM | POA: Diagnosis present

## 2022-11-29 DIAGNOSIS — D631 Anemia in chronic kidney disease: Secondary | ICD-10-CM | POA: Diagnosis present

## 2022-11-29 DIAGNOSIS — J9601 Acute respiratory failure with hypoxia: Secondary | ICD-10-CM | POA: Diagnosis present

## 2022-11-29 DIAGNOSIS — G629 Polyneuropathy, unspecified: Secondary | ICD-10-CM

## 2022-11-29 DIAGNOSIS — Z1152 Encounter for screening for COVID-19: Secondary | ICD-10-CM

## 2022-11-29 DIAGNOSIS — I5032 Chronic diastolic (congestive) heart failure: Secondary | ICD-10-CM | POA: Diagnosis present

## 2022-11-29 DIAGNOSIS — R110 Nausea: Secondary | ICD-10-CM | POA: Diagnosis present

## 2022-11-29 DIAGNOSIS — Z515 Encounter for palliative care: Secondary | ICD-10-CM | POA: Diagnosis not present

## 2022-11-29 DIAGNOSIS — G4700 Insomnia, unspecified: Secondary | ICD-10-CM | POA: Diagnosis present

## 2022-11-29 DIAGNOSIS — Z4781 Encounter for orthopedic aftercare following surgical amputation: Secondary | ICD-10-CM

## 2022-11-29 DIAGNOSIS — R1032 Left lower quadrant pain: Secondary | ICD-10-CM | POA: Diagnosis present

## 2022-11-29 DIAGNOSIS — I480 Paroxysmal atrial fibrillation: Secondary | ICD-10-CM | POA: Diagnosis present

## 2022-11-29 DIAGNOSIS — E8770 Fluid overload, unspecified: Secondary | ICD-10-CM | POA: Diagnosis present

## 2022-11-29 DIAGNOSIS — Z713 Dietary counseling and surveillance: Secondary | ICD-10-CM

## 2022-11-29 DIAGNOSIS — Z95828 Presence of other vascular implants and grafts: Secondary | ICD-10-CM

## 2022-11-29 DIAGNOSIS — Z7901 Long term (current) use of anticoagulants: Secondary | ICD-10-CM

## 2022-11-29 DIAGNOSIS — R079 Chest pain, unspecified: Secondary | ICD-10-CM | POA: Diagnosis present

## 2022-11-29 DIAGNOSIS — M19041 Primary osteoarthritis, right hand: Secondary | ICD-10-CM | POA: Diagnosis present

## 2022-11-29 DIAGNOSIS — Z823 Family history of stroke: Secondary | ICD-10-CM

## 2022-11-29 DIAGNOSIS — I716 Thoracoabdominal aortic aneurysm, without rupture, unspecified: Secondary | ICD-10-CM | POA: Diagnosis present

## 2022-11-29 DIAGNOSIS — K648 Other hemorrhoids: Secondary | ICD-10-CM | POA: Diagnosis present

## 2022-11-29 DIAGNOSIS — I951 Orthostatic hypotension: Secondary | ICD-10-CM | POA: Diagnosis present

## 2022-11-29 DIAGNOSIS — Z91018 Allergy to other foods: Secondary | ICD-10-CM

## 2022-11-29 DIAGNOSIS — Z89612 Acquired absence of left leg above knee: Secondary | ICD-10-CM | POA: Diagnosis not present

## 2022-11-29 DIAGNOSIS — Z7189 Other specified counseling: Secondary | ICD-10-CM | POA: Diagnosis not present

## 2022-11-29 LAB — BASIC METABOLIC PANEL
Anion gap: 12 (ref 5–15)
BUN: 39 mg/dL — ABNORMAL HIGH (ref 6–20)
CO2: 23 mmol/L (ref 22–32)
Calcium: 6.6 mg/dL — ABNORMAL LOW (ref 8.9–10.3)
Chloride: 102 mmol/L (ref 98–111)
Creatinine, Ser: 5.76 mg/dL — ABNORMAL HIGH (ref 0.61–1.24)
GFR, Estimated: 11 mL/min — ABNORMAL LOW (ref >60)
Glucose, Bld: 92 mg/dL (ref 70–99)
Potassium: 4.3 mmol/L (ref 3.5–5.1)
Sodium: 137 mmol/L (ref 135–145)

## 2022-11-29 LAB — CBC
HCT: 28.2 % — ABNORMAL LOW (ref 39.0–52.0)
Hemoglobin: 8.1 g/dL — ABNORMAL LOW (ref 13.0–17.0)
MCH: 27.2 pg (ref 26.0–34.0)
MCHC: 28.7 g/dL — ABNORMAL LOW (ref 30.0–36.0)
MCV: 94.6 fL (ref 80.0–100.0)
Platelets: 273 10*3/uL (ref 150–400)
RBC: 2.98 MIL/uL — ABNORMAL LOW (ref 4.22–5.81)
RDW: 17.1 % — ABNORMAL HIGH (ref 11.5–15.5)
WBC: 10.3 10*3/uL (ref 4.0–10.5)
nRBC: 0.2 % (ref 0.0–0.2)

## 2022-11-29 LAB — PHOSPHORUS: Phosphorus: 2 mg/dL — ABNORMAL LOW (ref 2.5–4.6)

## 2022-11-29 MED ORDER — SENNOSIDES-DOCUSATE SODIUM 8.6-50 MG PO TABS: 1.0000 | ORAL_TABLET | Freq: Two times a day (BID) | ORAL | Status: DC

## 2022-11-29 MED ORDER — LIDOCAINE 5 % EX PTCH
3.0000 | MEDICATED_PATCH | CUTANEOUS | Status: DC
  Administered 2022-11-29 – 2022-12-12 (×11): 3 via TRANSDERMAL
  Filled 2022-11-29 (×13): qty 3

## 2022-11-29 MED ORDER — GUAIFENESIN-DM 100-10 MG/5ML PO SYRP: 5.0000 mL | ORAL_SOLUTION | Freq: Four times a day (QID) | ORAL | Status: DC | PRN

## 2022-11-29 MED ORDER — ACETAMINOPHEN 325 MG PO TABS
650.0000 mg | ORAL_TABLET | Freq: Three times a day (TID) | ORAL | Status: DC
  Administered 2022-11-29 – 2022-12-14 (×55): 650 mg via ORAL
  Filled 2022-11-29 (×56): qty 2

## 2022-11-29 MED ORDER — PROCHLORPERAZINE EDISYLATE 10 MG/2ML IJ SOLN: 5.0000 mg | Freq: Four times a day (QID) | INTRAMUSCULAR | Status: DC | PRN

## 2022-11-29 MED ORDER — MIDODRINE HCL 5 MG PO TABS
15.0000 mg | ORAL_TABLET | Freq: Three times a day (TID) | ORAL | Status: DC
  Administered 2022-11-29 – 2022-12-14 (×44): 15 mg via ORAL
  Filled 2022-11-29 (×45): qty 3

## 2022-11-29 MED ORDER — AMIODARONE HCL 200 MG PO TABS
200.0000 mg | ORAL_TABLET | Freq: Every day | ORAL | Status: DC
  Administered 2022-11-30 – 2022-12-14 (×15): 200 mg via ORAL
  Filled 2022-11-29 (×15): qty 1

## 2022-11-29 MED ORDER — CALCIUM CARBONATE ANTACID 1250 MG/5ML PO SUSP: 500.0000 mg | Freq: Four times a day (QID) | ORAL | Status: DC | PRN

## 2022-11-29 MED ORDER — PROCHLORPERAZINE 25 MG RE SUPP: 12.5000 mg | Freq: Four times a day (QID) | RECTAL | Status: DC | PRN

## 2022-11-29 MED ORDER — DARBEPOETIN ALFA 100 MCG/0.5ML IJ SOSY
100.0000 ug | PREFILLED_SYRINGE | INTRAMUSCULAR | Status: DC
  Administered 2022-12-03 – 2022-12-10 (×2): 100 ug via SUBCUTANEOUS
  Filled 2022-11-29 (×2): qty 0.5

## 2022-11-29 MED ORDER — PROCHLORPERAZINE MALEATE 5 MG PO TABS: 5.0000 mg | ORAL_TABLET | Freq: Four times a day (QID) | ORAL | Status: DC | PRN

## 2022-11-29 MED ORDER — HYDROXYZINE HCL 25 MG PO TABS: 25.0000 mg | ORAL_TABLET | Freq: Three times a day (TID) | ORAL | Status: DC | PRN

## 2022-11-29 MED ORDER — ACETAMINOPHEN 325 MG PO TABS
325.0000 mg | ORAL_TABLET | ORAL | Status: DC | PRN
  Administered 2022-12-04 – 2022-12-05 (×2): 650 mg via ORAL
  Filled 2022-11-29 (×3): qty 2

## 2022-11-29 MED ORDER — CAMPHOR-MENTHOL 0.5-0.5 % EX LOTN: 1.0000 | TOPICAL_LOTION | Freq: Three times a day (TID) | CUTANEOUS | Status: DC | PRN

## 2022-11-29 MED ORDER — HYDROCERIN EX CREA
TOPICAL_CREAM | Freq: Every day | CUTANEOUS | Status: DC
  Filled 2022-11-29 (×2): qty 113

## 2022-11-29 MED ORDER — RENA-VITE PO TABS
1.0000 | ORAL_TABLET | Freq: Every day | ORAL | Status: DC
  Administered 2022-11-29 – 2022-12-13 (×15): 1 via ORAL
  Filled 2022-11-29 (×14): qty 1

## 2022-11-29 MED ORDER — METHOCARBAMOL 500 MG PO TABS
500.0000 mg | ORAL_TABLET | Freq: Four times a day (QID) | ORAL | Status: DC | PRN
  Administered 2022-11-30 – 2022-12-12 (×14): 500 mg via ORAL
  Filled 2022-11-29 (×14): qty 1

## 2022-11-29 MED ORDER — SENNOSIDES-DOCUSATE SODIUM 8.6-50 MG PO TABS
1.0000 | ORAL_TABLET | Freq: Two times a day (BID) | ORAL | Status: DC
  Administered 2022-11-29 – 2022-12-14 (×16): 1 via ORAL
  Filled 2022-11-29 (×30): qty 1

## 2022-11-29 MED ORDER — APIXABAN 2.5 MG PO TABS: 2.5000 mg | ORAL_TABLET | Freq: Two times a day (BID) | ORAL | Status: DC

## 2022-11-29 MED ORDER — PHENOL 1.4 % MT LIQD: 1.0000 | OROMUCOSAL | Status: DC | PRN

## 2022-11-29 MED ORDER — HYDROCERIN EX CREA: 1.0000 | TOPICAL_CREAM | Freq: Every day | CUTANEOUS | Status: DC

## 2022-11-29 MED ORDER — MILK AND MOLASSES ENEMA: 1.0000 | Freq: Every day | RECTAL | Status: DC | PRN

## 2022-11-29 MED ORDER — CHLORHEXIDINE GLUCONATE CLOTH 2 % EX PADS
6.0000 | MEDICATED_PAD | Freq: Every day | CUTANEOUS | Status: DC
  Administered 2022-11-30: 6 via TOPICAL

## 2022-11-29 MED ORDER — GABAPENTIN 100 MG PO CAPS: 100.0000 mg | ORAL_CAPSULE | Freq: Two times a day (BID) | ORAL | Status: DC

## 2022-11-29 MED ORDER — SORBITOL 70 % SOLN: 30.0000 mL | Status: DC | PRN

## 2022-11-29 MED ORDER — DOCUSATE SODIUM 100 MG PO CAPS
100.0000 mg | ORAL_CAPSULE | Freq: Every day | ORAL | Status: DC
  Administered 2022-12-03 – 2022-12-13 (×9): 100 mg via ORAL
  Filled 2022-11-29 (×16): qty 1

## 2022-11-29 MED ORDER — FLEET ENEMA RE ENEM: 1.0000 | ENEMA | Freq: Once | RECTAL | Status: DC | PRN

## 2022-11-29 MED ORDER — DIPHENHYDRAMINE HCL 25 MG PO CAPS: 25.0000 mg | ORAL_CAPSULE | Freq: Four times a day (QID) | ORAL | Status: DC | PRN

## 2022-11-29 MED ORDER — ALUM & MAG HYDROXIDE-SIMETH 200-200-20 MG/5ML PO SUSP: 30.0000 mL | ORAL | Status: DC | PRN

## 2022-11-29 MED ORDER — OXYCODONE HCL 10 MG PO TABS: 10.0000 mg | ORAL_TABLET | Freq: Four times a day (QID) | ORAL | Status: DC | PRN

## 2022-11-29 MED ORDER — GABAPENTIN 300 MG PO CAPS
300.0000 mg | ORAL_CAPSULE | Freq: Every day | ORAL | Status: DC
  Administered 2022-11-30 – 2022-12-13 (×14): 300 mg via ORAL
  Filled 2022-11-29 (×14): qty 1

## 2022-11-29 MED ORDER — BISACODYL 10 MG RE SUPP: 10.0000 mg | Freq: Every day | RECTAL | Status: DC | PRN

## 2022-11-29 MED ORDER — HYDROXYZINE HCL 25 MG PO TABS
25.0000 mg | ORAL_TABLET | Freq: Three times a day (TID) | ORAL | Status: DC | PRN
  Administered 2022-12-03: 25 mg via ORAL
  Filled 2022-11-29: qty 1

## 2022-11-29 MED ORDER — SEVELAMER CARBONATE 2.4 G PO PACK
4.8000 g | PACK | Freq: Three times a day (TID) | ORAL | Status: DC
  Administered 2022-11-29 – 2022-12-06 (×19): 4.8 g via ORAL
  Filled 2022-11-29 (×22): qty 2

## 2022-11-29 MED ORDER — ZOLPIDEM TARTRATE 5 MG PO TABS
5.0000 mg | ORAL_TABLET | Freq: Every evening | ORAL | Status: DC | PRN
  Administered 2022-11-29 – 2022-12-09 (×8): 5 mg via ORAL
  Filled 2022-11-29 (×8): qty 1

## 2022-11-29 MED ORDER — OXYCODONE HCL 5 MG PO TABS
10.0000 mg | ORAL_TABLET | ORAL | Status: DC | PRN
  Filled 2022-11-29: qty 2

## 2022-11-29 MED ORDER — SIMETHICONE 80 MG PO CHEW: 80.0000 mg | CHEWABLE_TABLET | Freq: Four times a day (QID) | ORAL | Status: DC | PRN

## 2022-11-29 MED ORDER — POLYETHYLENE GLYCOL 3350 17 G PO PACK: 17.0000 g | PACK | Freq: Two times a day (BID) | ORAL | Status: DC

## 2022-11-29 MED ORDER — MIDODRINE HCL 5 MG PO TABS: 15.0000 mg | ORAL_TABLET | Freq: Three times a day (TID) | ORAL | Status: DC

## 2022-11-29 MED ORDER — POLYETHYLENE GLYCOL 3350 17 G PO PACK
17.0000 g | PACK | Freq: Two times a day (BID) | ORAL | Status: DC
  Administered 2022-12-06 – 2022-12-12 (×4): 17 g via ORAL
  Filled 2022-11-29 (×26): qty 1

## 2022-11-29 MED ORDER — APIXABAN 2.5 MG PO TABS
2.5000 mg | ORAL_TABLET | Freq: Two times a day (BID) | ORAL | Status: DC
  Administered 2022-11-29 – 2022-12-14 (×30): 2.5 mg via ORAL
  Filled 2022-11-29 (×30): qty 1

## 2022-11-29 MED ORDER — ENSURE ENLIVE PO LIQD: 237.0000 mL | Freq: Two times a day (BID) | ORAL | Status: DC

## 2022-11-29 NOTE — Progress Notes (Signed)
Inpatient Rehab Admissions Coordinator:    I have insurance approval and a bed available for pt to admit to CIR today. Dr. Renford Dills in agreement.  Will let pt/family and TOC team know.   Estill Dooms, PT, DPT Admissions Coordinator [phone] 11/29/22  10:33 AM

## 2022-11-29 NOTE — Progress Notes (Signed)
Patient provided with verbal discharge instructions. Paper copy of discharge provided to patient. RN answered all questions. VSS at discharge. IV removed. Patient belongings sent with patient. Patient transported via bed to CIR-

## 2022-11-29 NOTE — H&P (Signed)
Physical Medicine and Rehabilitation Admission H&P        Chief Complaint  Patient presents with   Functional deficits due to debility from multiple medical issues   L-AKA      HPI:  Corey [name] is a 52 year old male with history of ESRD-HD TTS, calciphylaxis, stargardt disease with low vision, PAF-chronic coumadin, aortic dissection s/p repair, PAD, neuropathy who missed HD sessions X 2 due to nausea,vomiting and diarrhea. He presented to ED on 10/27/22 with hypoxic respiratory failure, hyperkalemia and complex moderate to large left pleural effusion. He underwent emergent HD.  PCCM recommended chest tube once INR trended down. CT abdomen pelvis done due to abdominal pain and showed stable type B aortic dissection with stable penetrating ulcer. Patient had declined surgery in the past. Dr. Edilia Bo consulted for input on aneurysm as well as BLE with chronic venous stasis changes and evidence of PAD with diminished pulse right femoral artery. As patient non-ambulatory without symptoms, ABI ordered  revealing non compressible veins. He declined recommendations of BLE amputation and Eucerin cream recommended with follow up in a year for CTA abdomen. Left clavicular SQ mass stable since [id number] and felt to be possible old hematoma.     Vitamin K administered 7/30 due to slow drop in INR, CT placed on 07/31 by Dr. Denese Killings. Fluid felt to be parapneumonic with negative cultures and PNA treated with 10 day course of IV rocephin/5 days of Zithromax and transitioned to Augmentin thru 08/14.  Follow up CT chest 08/03 showed mild increase in left pleural effusion with no change in near complete collapse of LLL  and required intrapleural fibrinolysis X 3 doses. He has had issues with hypotension requiring midodrine and additional dose of IV albumin.  Once drainage decreased with decrease in size of left effusion, CT removed on 08/06. SOB improving and therapy was working with patient who has been limited by  weakness and left flank pain. He developed new left heel wound and was agreeable to L-AKA by Dr. Myra Gianotti on 08/15 and felt to be high risk for future R-AKA.  Post op transitioned to Eliquis, oxygen being weaned off, post op pain slowly improving, hypotension continues to be an issue but orthostasis improving. Due to ongoing issues with hypoxia,  CXR ordered 08/26 showed diffuse pulmonary edema. He underwent HD today with IV albumin to assist with 2 kg goal. Therapy has been working with patient who is significantly debilitated due to medical issues and is working on sitting tolerance. CIR recommended due to functional decline.    Pt reports pain is pretty bad- residual limb pain- throbs/exploding feeling.  But phantom pain also bad- cramping in calf and L ankle/foot.  Pain meds took edge off, but stopped taking pain meds most of time due to constipation.    Also having intermittent nausea, no vomiting.  BM finally today x2 this AM- after suppository- finally started going.   Has old fistulas, but needs to use R chest HD catheter to be dialyzed.      Review of Systems  Constitutional:  Negative for chills and fever.  HENT:  Negative for hearing loss and tinnitus.   Eyes:  Positive for blurred vision.  Respiratory:  Positive for cough. Negative for shortness of breath.   Cardiovascular:  Negative for chest pain and palpitations.  Gastrointestinal:  Positive for abdominal pain (severe on LLQ prior to BM/defacating) and nausea. Negative for heartburn.  Musculoskeletal:  Positive for myalgias.  Skin:  Negative for rash.  Neurological:  Positive for sensory change (decreased sensation from saddle down--feels pressure only) and weakness. Negative for dizziness and headaches.  Psychiatric/Behavioral:  The patient has insomnia.   All other systems reviewed and are negative.           Past Medical History:  Diagnosis Date   Arthritis      hands and shoulders   Blindness and low vision       "Stargardt disease"   Dissection of aorta (HCC) 2004    a. s/p extensive repeair in 2004 in [place] complicated by ESRD, lower extremity paralysis, coma, and extended hospitalization of 2 years   ESRD (end stage renal disease) (HCC)      a. TTS   Headache     History of cardioversion 2014   Hypertension     Neuropathy     Non-healing non-surgical wound 03/2016   PAF (paroxysmal atrial fibrillation) (HCC)      a. s/p DCCV in 2014; b. on Coumadin; c. CHADS2VASc => 2 (HTN, vascular disease)   Paralysis (HCC)      due to dissection of aorta in 2004, lower extremities   Pneumonia                 Past Surgical History:  Procedure Laterality Date   AMPUTATION Left 11/17/2022    Procedure: AMPUTATION ABOVE KNEE;  Surgeon: Nada Libman, MD;  Location: [facility] OR;  Service: Vascular;  Laterality: Left;   APPLICATION OF WOUND VAC Left 04/13/2016    Procedure: APPLICATION OF WOUND VAC;  Surgeon: Fransisco Hertz, MD;  Location: [facility] OR;  Service: Vascular;  Laterality: Left;   APPLICATION OF WOUND VAC Left 04/18/2016    Procedure: APPLICATION OF WOUND VAC;  Surgeon: Maeola Harman, MD;  Location: [facility] OR;  Service: Vascular;  Laterality: Left;  Wound vac change    APPLICATION OF WOUND VAC Left 04/20/2016    Procedure: WOUND VAC CHANGE;  Surgeon: Fransisco Hertz, MD;  Location: [facility] OR;  Service: Vascular;  Laterality: Left;   AV FISTULA PLACEMENT       BASCILIC VEIN TRANSPOSITION Left 12/09/2015    Procedure: FIRST STAGE BASILIC VEIN TRANSPOSITION LEFT UPPER ARM;  Surgeon: Fransisco Hertz, MD;  Location: [facility] OR;  Service: Vascular;  Laterality: Left;   BASCILIC VEIN TRANSPOSITION Left 03/09/2016    Procedure: SECOND STAGE BASILIC VEIN TRANSPOSITION WITH REVISION OF ANASTOMOSIS LEFT UPPER ARM;  Surgeon: Fransisco Hertz, MD;  Location: [facility] OR;  Service: Vascular;  Laterality: Left;   CARDIOVERSION       CARDIOVERSION N/A 01/04/2019    Procedure: CARDIOVERSION;  Surgeon: Lewayne Bunting, MD;  Location: [facility] ENDOSCOPY;   Service: Cardiovascular;  Laterality: N/A;   CHEST TUBE INSERTION Left 11/02/2022    Procedure: CHEST TUBE INSERTION;  Surgeon: Lynnell Catalan, MD;  Location: [facility] ENDOSCOPY;  Service: Pulmonary;  Laterality: Left;  Please have both Pigtail (COOK) and chest tube tray available   REPAIR OF ACUTE ASCENDING THORACIC AORTIC DISSECTION       REVISON OF ARTERIOVENOUS FISTULA Left 04/20/2016    Procedure: LIGATION OF BASILIC VEIN TRANSPOSITION;  Surgeon: Fransisco Hertz, MD;  Location: [facility] OR;  Service: Vascular;  Laterality: Left;   WOUND DEBRIDEMENT Left 04/13/2016    Procedure: DEBRIDEMENT WOUND;  Surgeon: Fransisco Hertz, MD;  Location: [facility] OR;  Service: Vascular;  Laterality: Left;               Family History  Problem Relation Age of Onset   Cancer Mother     Hypertension Mother     Cancer Father     Hypertension Father     Thyroid disease Sister     Stroke Brother          67          Social History:  Lives with brother (who has had multiple stroke and limited). 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 Cousin checks in. He  reports that he has never smoked. He has never used smokeless tobacco. He reports that he does not drink alcohol and does not use drugs.     Allergies       Allergies  Allergen Reactions   Ciprofloxacin Other (See Comments)      Aortic dissection   Heparin Other (See Comments)      UNSPECIFIED REACTION :  On Coumadin since 2004     HIT panel negative 01/19/17   Doxercalciferol Other (See Comments)   Quinolones Other (See Comments)      unknown              Medications Prior to Admission  Medication Sig Dispense Refill   amiodarone (PACERONE) 200 MG tablet Take 1 tablet (200 mg total) by mouth daily. 90 tablet 3   cinacalcet (SENSIPAR) 60 MG tablet Take 120 mg by mouth every evening.       Multiple Vitamins-Minerals (MULTIVITAMIN WITH MINERALS) tablet Take 1 tablet by mouth daily.       RENVELA 2.4 g PACK Take 4.8 g by mouth in the  morning, at noon, and at bedtime.       warfarin (COUMADIN) 2 MG tablet Take 1 tablet by mouth daily or as directed by Anticoagulation Clinic. 30 tablet 0   docusate sodium (COLACE) 100 MG capsule Take 1 capsule (100 mg total) by mouth every 12 (twelve) hours. (Patient not taking: Reported on 10/27/2022) 60 capsule 0   metoprolol tartrate (LOPRESSOR) 25 MG tablet Take 0.5 tablets (12.5 mg total) by mouth 2 (two) times daily. Hold on the AM of dialysis days (Patient not taking: Reported on 10/27/2022) 90 tablet 3            Home: Home Living Family/patient expects to be discharged to:: Private residence Living Arrangements: Other relatives (brother) Available Help at Discharge: Family, Available 24 hours/day Type of Home: House Home Access: Ramped entrance Home Layout: One level Bathroom Shower/Tub: Tub/shower unit, Sponge bathes at baseline Bathroom Toilet: Standard Bathroom Accessibility: Yes Home Equipment: BSC/3in1, Wheelchair - manual  Lives With: Family (brother)   Functional History: Prior Function Prior Level of Function : Needs assist Physical Assist : ADLs (physical), Mobility (physical) Mobility (physical): Transfers (occasionally, but was independent with transfers) ADLs (physical): IADLs Mobility Comments: mod indep w/c level mobility ADLs Comments: sponges bathes at baseline; brother assists with higher-level ADLs and iADLs as needed   Functional Status:  Mobility: Bed Mobility Overal bed mobility: Needs Assistance Bed Mobility: Supine to Sit Rolling: Supervision Sidelying to sit: Min assist Supine to sit: Min assist Sit to supine: Supervision, Used rails Sit to sidelying: Contact guard assist General bed mobility comments: pt in chair upon PT arrival Transfers Overall transfer level: Needs assistance Equipment used: None Transfers: Bed to chair/wheelchair/BSC Bed to/from chair/wheelchair/BSC transfer type:: Lateral/scoot transfer  Lateral/Scoot Transfers:  Min assist General transfer comment: pt deferred going back to bed due to wanting to eat lunch in chair Ambulation/Gait General Gait Details:  unable   ADL: ADL Overall ADL's : Needs assistance/impaired Eating/Feeding: Modified independent, Sitting Eating/Feeding Details (indicate cue type and reason): Orientation secondary to low vision Grooming: Modified independent, Sitting Grooming Details (indicate cue type and reason): Orientation secondary to low vision Upper Body Bathing: Modified independent, Sitting Lower Body Bathing: Contact guard assist, Sitting/lateral leans, Cueing for compensatory techniques Lower Body Bathing Details (indicate cue type and reason): for thoroughness Upper Body Dressing : Modified independent, Sitting Lower Body Dressing: Moderate assistance, Sitting/lateral leans Lower Body Dressing Details (indicate cue type and reason): donning socks, poor skin integrity and painful Toilet Transfer: Minimal assistance, Transfer board, Requires drop arm Toilet Transfer Details (indicate cue type and reason): min A for lateral scoot transfer to recliner Toileting- Clothing Manipulation and Hygiene: Minimal assistance, Sitting/lateral lean Toileting - Clothing Manipulation Details (indicate cue type and reason): for thoroughness Functional mobility during ADLs: Minimal assistance General ADL Comments: Pt with decreased activity tolerance. Pt requires orientation to environment when items are moved in his room secondary to low vision.   Cognition: Cognition Overall Cognitive Status: Within Functional Limits for tasks assessed Orientation Level: Oriented X4 Cognition Arousal: Alert Behavior During Therapy: WFL for tasks assessed/performed Overall Cognitive Status: Within Functional Limits for tasks assessed General Comments: Pt AAOx4 and pleasant throughout session.     Blood pressure 103/62, pulse 64, temperature 98 F (36.7 C), temperature source Oral, resp. rate  16, height 6\' 3"  (1.905 m), weight 58.6 kg, SpO2 100%. Physical Exam Vitals and nursing note reviewed.  Constitutional:      Comments: Thin ill appearing male with 2 L oxygen per [place]. NAD Sitting up in bed wearing eyeglasses that don't work per pt- "for show".  C/o pain in L AKA and LLE.   HENT:     Head: Normocephalic and atraumatic.     Right Ear: External ear normal.     Left Ear: External ear normal.     Nose: Nose normal. No congestion.     Mouth/Throat:     Mouth: Mucous membranes are dry.     Pharynx: Oropharynx is clear. No oropharyngeal exudate.  Eyes:     General:        Right eye: No discharge.        Left eye: No discharge.     Comments: Injected. Does not make eye contact but looks in general direction.  Says can see shapes, but nothing clear  Cardiovascular:     Rate and Rhythm: Normal rate and regular rhythm.     Heart sounds: Normal heart sounds. No murmur heard.    No gallop.  Pulmonary:     Effort: Pulmonary effort is normal. No respiratory distress.     Breath sounds: No wheezing, rhonchi or rales.     Comments: Decreased at bases R>L even though chest tube was on L side Abdominal:     General: Abdomen is flat. There is no distension.     Palpations: Abdomen is soft.     Tenderness: There is no abdominal tenderness.  Musculoskeletal:        General: No swelling or tenderness.     Cervical back: Neck supple. No tenderness.     Comments: Amputation site with staples in place and pillow under AKA with multiple dried areas of serous drainage. Right foot with overgrown toe nails.   Ues 5-/5 B/L RLE- HF 4-/5; otherwise DF 0/5 and PF and KE 2-/5 LLE- L AKA- with HF 4-/5   Skin:    General:  Skin is warm and dry.     Comments: Boggy area infra clavicular area--non tender and reported to be stable. Multiple grafts RUE--non functional. R-internal jugular noted. RLE with areas of discoloration and stasis changes.  R HD catheter looks OK 2 IV"s LUE- look OK L aka  has staples intact- dog ears notable, not shaping so far Not significant edema, however Leg/ L AKA- tip elevated on pillow- said per Surgeon  Neurological:     Mental Status: He is alert and oriented to person, place, and time.     Comments: Decreased to absent below R knee  Psychiatric:        Mood and Affect: Mood normal.        Behavior: Behavior normal.        Lab Results Last 48 Hours        Results for orders placed or performed during the hospital encounter of 10/27/22 (from the past 48 hour(s))  Renal function panel     Status: Abnormal    Collection Time: 11/28/22  9:00 AM  Result Value Ref Range    Sodium 138 135 - 145 mmol/L    Potassium 3.6 3.5 - 5.1 mmol/L    Chloride 99 98 - 111 mmol/L    CO2 25 22 - 32 mmol/L    Glucose, Bld 100 (H) 70 - 99 mg/dL      Comment: Glucose reference range applies only to samples taken after fasting for at least 8 hours.    BUN 30 (H) 6 - 20 mg/dL    Creatinine, Ser 4.09 (H) 0.61 - 1.24 mg/dL    Calcium 6.6 (L) 8.9 - 10.3 mg/dL    Phosphorus 2.0 (L) 2.5 - 4.6 mg/dL    Albumin 2.4 (L) 3.5 - 5.0 g/dL    GFR, Estimated 13 (L) >60 mL/min      Comment: (NOTE) Calculated using the CKD-EPI Creatinine Equation (2021)      Anion gap 14 5 - 15      Comment: Performed at [facility] Lab, [address], [place], [place] [address]  CBC     Status: Abnormal    Collection Time: 11/28/22  9:00 AM  Result Value Ref Range    WBC 10.9 (H) 4.0 - 10.5 K/uL    RBC 3.14 (L) 4.22 - 5.81 MIL/uL    Hemoglobin 8.4 (L) 13.0 - 17.0 g/dL    HCT 47.8 (L) 29.5 - 52.0 %    MCV 95.5 80.0 - 100.0 fL    MCH 26.8 26.0 - 34.0 pg    MCHC 28.0 (L) 30.0 - 36.0 g/dL    RDW 62.1 (H) 30.8 - 15.5 %    Platelets 274 150 - 400 K/uL    nRBC 0.0 0.0 - 0.2 %      Comment: Performed at [facility], [address], [place], [place] [address]  Basic metabolic panel     Status: Abnormal    Collection Time: 11/29/22  3:52 AM  Result Value Ref Range    Sodium 137 135  - 145 mmol/L    Potassium 4.3 3.5 - 5.1 mmol/L    Chloride 102 98 - 111 mmol/L    CO2 23 22 - 32 mmol/L    Glucose, Bld 92 70 - 99 mg/dL      Comment: Glucose reference range applies only to samples taken after fasting for at least 8 hours.    BUN 39 (H) 6 - 20 mg/dL  Creatinine, Ser 5.76 (H) 0.61 - 1.24 mg/dL    Calcium 6.6 (L) 8.9 - 10.3 mg/dL    GFR, Estimated 11 (L) >60 mL/min      Comment: (NOTE) Calculated using the CKD-EPI Creatinine Equation (2021)      Anion gap 12 5 - 15      Comment: Performed at [facility], [address] [place], [place] [address]  CBC     Status: Abnormal    Collection Time: 11/29/22  3:52 AM  Result Value Ref Range    WBC 10.3 4.0 - 10.5 K/uL    RBC 2.98 (L) 4.22 - 5.81 MIL/uL    Hemoglobin 8.1 (L) 13.0 - 17.0 g/dL    HCT 09.8 (L) 11.9 - 52.0 %    MCV 94.6 80.0 - 100.0 fL    MCH 27.2 26.0 - 34.0 pg    MCHC 28.7 (L) 30.0 - 36.0 g/dL    RDW 14.7 (H) 82.9 - 15.5 %    Platelets 273 150 - 400 K/uL    nRBC 0.2 0.0 - 0.2 %      Comment: Performed at [facility] Lab, [address], [place], [place] [address]       Imaging Results (Last 48 hours)  DG CHEST PORT 1 VIEW   Result Date: 11/28/2022 CLINICAL DATA:  Hypoxia. EXAM: PORTABLE CHEST 1 VIEW COMPARISON:  11/11/2022 FINDINGS: There is a right chest wall dialysis catheter with tips in the right atrium. Stable cardiomediastinal contours. Diffuse pulmonary edema is unchanged in the interval. Areas of subsegmental atelectasis and scarring is noted within the lower lung zones bilaterally. This appears similar to previous exam. Suspect small left pleural effusion. IMPRESSION: 1. No significant interval change. 2. Diffuse pulmonary edema. 3. Bibasilar subsegmental atelectasis and scarring. Electronically Signed   By: Signa Kell M.D.   On: 11/28/2022 13:03           Blood pressure 103/62, pulse 64, temperature 98 F (36.7 C), temperature source Oral, resp. rate 16, height 6\' 3"  (1.905 m),  weight 58.6 kg, SpO2 100%.   Medical Problem List and Plan: 1. Functional deficits secondary to  L AKA             -patient may not shower due to R HD catheter             -ELOS/Goals: 12-14 days supervision Admit to CIR- suggest mirror therapy for phantom pain control- also likely needs shrinkers x2 2.  Antithrombotics: -DVT/anticoagulation:  Pharmaceutical: Eliquis. IVC filter in place.              -antiplatelet therapy: N/A 3. Pain Management: oxycodone prn. Pt hasn't been taking much since constipation Suggest mirror therapy 4. Mood/Behavior/Sleep: LCSW to follow for evaluation and support.              -antipsychotic agents: N/A 5. Neuropsych/cognition: This patient is capable of making decisions on his own behalf. 6. Skin/Wound Care: Routine pressure relief measure 7. Fluids/Electrolytes/Nutrition:  Strict I/O. Daily weights. Diet changed to Renal per input from nephrology. 8. L-AKA: Monitor incision for healing. Dry dressing.  9. ESRD: Renal diet with 1200 cc FR/HD TTS at the end of the day to help with tolerance of therapy.  10. H/o AAA aneurysm rupture s/p repair:  with resultant paraplegia, sensory deficits from saddle down and cauda equina syndrome             --was able to walk till a few years ago  when left knee gave out.              --has severe LLQ pain prior to defecation. Continue Senna/miralax--discussed suppository every other day but patient wants to think on it "that's too much" 11. Anemia of chronic disease:] 12. L- PNA and parapneumonic effusion s/p CT: Has completed 2 week course of antibiotics.  13. Fluid overload: Continue to monitor for symptoms and wean oxygen as tolerated.  14. Neurogenic bowel: Will monitor for now.   15. Neuropathic pain- will change gabapentin to 300 mg QHS                   Jacquelynn Cree, PA-C 11/29/2022     I have personally performed a face to face diagnostic evaluation of this patient and formulated the key components of  the plan.  Additionally, I have personally reviewed laboratory data, imaging studies, as well as relevant notes and concur with the physician assistant's documentation above.   The patient's status has not changed from the original H&P.  Any changes in documentation from the acute care chart have been noted above.

## 2022-11-29 NOTE — Progress Notes (Signed)
Pt arrived from dialysis after lunch, meds given po. BM in bedpan, alert and oriented. Report given to Tiffanie at [facility]. Pt to go to 4W room 12. VSS on 2L O2 [place].

## 2022-11-29 NOTE — Progress Notes (Signed)
[facility] NEPHROLOGY PROGRESS NOTE  Subjective:  Last HD on 8/24 with 1 kg UF.  States that he still has some leg discomfort associated with the left AKA.  Interim CXR with pulmonary edema   Review of systems:   He is a little short of breath at times No chest pain Denies n/v   Objective Vital signs in last 24 hours: Vitals:   11/19/22 1130 11/19/22 1133 11/19/22 1142 11/19/22 1155  BP:  (!) 113/90  128/70  Pulse: 66 67  66  Resp: 12 16  18   Temp:  98 F (36.7 C)  98 F (36.7 C)  TempSrc:  Oral    SpO2: 100% 99%  100%  Weight:   54.5 kg   Height:       Physical Exam:    General adult male in bed in no acute distress HEENT normocephalic atraumatic extraocular movements intact sclera anicteric Neck supple trachea midline Lungs clear to auscultation bilaterally; reduced breath sounds right lung; normal work of breathing at rest on 2 liters  Heart S1S2 no rub Abdomen soft nontender nondistended Extremities no pitting edema; left AKA  Psych normal mood and affect Neuro - alert and oriented x 3 provides hx and follows commands; visually impaired Access RIJ tunn catheter in place; failed accesses bilateral upper extremities       OP HD: TTS [facility] 4h  2/2 bath  66kg   TDC   no heparin (allergy)  lock TDC w/ citrate - No ESA or VDRA - Binder: Renvela pwdr 4.8g TID - Sensipar 180mg  every day at home    Assessment/ Plan  # Acute hypoxic respiratory failure: In setting of missed HD, pna w/ effusion. CXR with pleural effusion s/p chest tube 7/31 now removed.  Improved.    # PNA/ L parapneumonic pleural effusion: s/p thoracentesis with cultures. S/p IV abx course and finished with augmentin  #Left leg wound - s/p AKA 8/15  # ESRD: continue HD per TTS schedule.  UF as able - albumin today to support goal of 2 kg as tolerated  #Heparin allergy - use citrate for cath lock.  #Hypotension/ volume:  He is on midodrine. UF as tolerated with HD   #Anemia of  ESRD: increased aranesp to 100 mcg every Sat for the 8/31 dose onward  # Secondary HPTH: switched to sevelamer per pharm recs.  At one point cinacalcet was held due to hypocalcemia but then resumed at 90 mg daily (half of his normal dose).  Stopped cinacalcet again on 8/26 due to hypocalcemia.  # Hypocalcemia - see above - holding sensipar  #A-fib: On amiodarone and eliquis - per primary team; has IVC filter  # H/o type B aortic dissection s/p repair (2004)   # dispo - awaiting rehab facility or CIR per charting.   He has appealed for rehab coverage      Recent Labs  Lab 11/28/22 0900 11/29/22 0352  HGB 8.4* 8.1*  ALBUMIN 2.4*  --   CALCIUM 6.6* 6.6*  PHOS 2.0*  --   CREATININE 5.19* 5.76*  K 3.6 4.3    Inpatient medications:  acetaminophen  650 mg Oral Q6H WA   amiodarone  200 mg Oral Daily   apixaban  2.5 mg Oral BID   Chlorhexidine Gluconate Cloth  6 each Topical Q0600   Chlorhexidine Gluconate Cloth  6 each Topical Q0600   [START ON 12/03/2022] darbepoetin (ARANESP) injection - DIALYSIS  100 mcg Subcutaneous Q Sat-1800   docusate sodium  100 mg Oral Daily   feeding supplement  237 mL Oral BID BM   gabapentin  100 mg Oral BID   hydrocerin   Topical Daily   lidocaine  3 patch Transdermal Q24H   midodrine  15 mg Oral TID WC   multivitamin  1 tablet Oral QHS   polyethylene glycol  17 g Oral BID   senna-docusate  1 tablet Oral BID   sevelamer carbonate  4.8 g Oral TID WC    albumin human     anticoagulant sodium citrate 100 mL/hr at 11/18/22 1400   anticoagulant sodium citrate, calcium carbonate (dosed in mg elemental calcium), camphor-menthol **AND** hydrOXYzine, docusate sodium, HYDROmorphone (DILAUDID) injection, methocarbamol, ondansetron **OR** ondansetron (ZOFRAN) IV, oxyCODONE, phenol, sorbitol, zolpidem  Estanislado Emms, MD 6:29 AM 11/29/2022

## 2022-11-29 NOTE — Progress Notes (Signed)
Report given to Dialysis.

## 2022-11-29 NOTE — Discharge Summary (Signed)
Physician Discharge Summary  Corey [name] YQM:[id number] DOB: [birth year] DOA: 10/27/2022  PCP: [name], NP  Admit date: 10/27/2022 Discharge date: 11/29/2022  Admitted From: Home Disposition:  Home  Discharge Condition:Stable CODE STATUS:FULL Diet recommendation: renal diet  Brief/Interim Summary: Patient is a 52 year old male with history of ESRD on dialysis on TTS schedule, calciphylaxis, paroxysmal A-fib on warfarin, type B aortic dissection, peripheral artery disease, chronic lower extremity edema, malnutrition, blindness, nonambulatory status who presented with shortness of breath, left shoulder, chest/flank pain, missed 2 dialysis sessions.  He was also hypoxic on presentation.  CT chest/abdomen/pelvis showed moderate to large loculated left-sided pleural effusion with lower lobe consolidation.  Admitted for further management of acute hypoxic respiratory failure.  Had chest tube placed on 7/31, required multiple rounds of lytics.  Pleural fluid culture did not show any growth.  Chest tube removed on 8/9, completed course of antibiotics.  He was found to have Found to have left heel necrosis on 8/12. Vascular surgery consulted and patient underwent left AKA on 8/15. Anticoagulation resumed with Eliquis.  PT/OT recommending CIR. medically stable for discharge Following problems were addressed during the hospitalization:  Acute hypoxic respiratory failure due to left lower lobe pneumonia, parapneumonic effusion: CT chest/abdomen/pelvis showed moderate to large loculated left-sided pleural effusion with lower lobe consolidation.  Had chest tube placed on 7/31, required multiple rounds of lytics.  Pleural fluid culture did not show any growth.  Chest tube removed on 8/9, completed course of antibiotics.  Currently on 2 L of oxygen.  Continue to try to wean.  Follow-up chest x-ray on 8/26 showed pulmonary edema, volume management as per dialysis.   ESRD on dialysis: Nephrology was   following for dialysis.   Acute on chronic diastolic congestive heart failure: Volume management as per dialysis.  Echocardiogram showed EF of 50 to 55%.  Appears euvolemic today.   Gangrene of the left heel/peripheral artery disease: Status post left AKA on 8/15 by vascular surgery.  Continue Eliquis. He had chronic bilateral lower extremity edema, venous stasis and calciphylaxis of lower extremity. Continue Eucerin,Tylenol, gabapentin.  Podiatry also saw the patient and recommended outpatient follow-up for hypertrophic toenail.  Follow-up with vascular surgery as an outpatient   Left subclavicular region mass: Presumed to be a sebaceous cyst.  Unchanged.  Management as per outpatient   Paroxysmal A-fib: Currently rate is controlled.  On amiodarone, Eliquis   Chronic hypotension: On midodrine therapy.   Anemia of  chronic disease: Likely secondary to ESRD.  Monitor hemoglobin intermittently.Given  IV iron and aranesp   Type B thoracic aortic dissection with aneurysmal dilatation of proximal descending thoracic aorta: 4.5 centimeter in diameter.  Continue monitoring intermittently   Debility/deconditioning/wheelchair-bound: Patient is also legally blind.  PT/OT recommending CIR.   Severe protein-calorie malnutrition: Nutritionist was following   Constipation: Continue bowel regimen   Discharge Diagnoses:  Principal Problem:   Acute respiratory failure with hypoxia (HCC) Active Problems:   ESRD on dialysis [facility])   Calciphylaxis   Essential hypertension   Chronic diastolic CHF (congestive heart failure) (HCC)   Atrial fibrillation, chronic (HCC)   End-stage renal disease on hemodialysis (HCC)   AAA (abdominal aortic aneurysm) without rupture (HCC)   Hyperkalemia   PVD (peripheral vascular disease) (HCC)   Constipation   Lobar pneumonia (HCC)   Parapneumonic effusion   Gangrene (HCC)   Protein-calorie malnutrition, severe    Discharge Instructions  Discharge Instructions      Diet - low sodium heart healthy  Complete by: As directed    Discharge instructions   Complete by: As directed    1)Please take your medications as instructed 2)Follow up with podiatry, vascular surgery as an outpatient.  Name and number of the providers have been attached   Discharge wound care:   Complete by: As directed    As per wound care   Increase activity slowly   Complete by: As directed       Allergies as of 11/29/2022       Reactions   Ciprofloxacin Other (See Comments)   Aortic dissection   Heparin Other (See Comments)   UNSPECIFIED REACTION :  On Coumadin since 2004 HIT panel negative 01/19/17   Doxercalciferol Other (See Comments)   Quinolones Other (See Comments)   unknown        Medication List     STOP taking these medications    cinacalcet 60 MG tablet Commonly known as: SENSIPAR   docusate sodium 100 MG capsule Commonly known as: COLACE   metoprolol tartrate 25 MG tablet Commonly known as: LOPRESSOR   warfarin 2 MG tablet Commonly known as: COUMADIN       TAKE these medications    amiodarone 200 MG tablet Commonly known as: PACERONE Take 1 tablet (200 mg total) by mouth daily.   apixaban 2.5 MG Tabs tablet Commonly known as: ELIQUIS Take 1 tablet (2.5 mg total) by mouth 2 (two) times daily.   camphor-menthol lotion Commonly known as: SARNA Apply 1 Application topically every 8 (eight) hours as needed for itching.   feeding supplement Liqd Take 237 mLs by mouth 2 (two) times daily between meals.   gabapentin 100 MG capsule Commonly known as: NEURONTIN Take 1 capsule (100 mg total) by mouth 2 (two) times daily.   hydrocerin Crea Apply 1 Application topically daily.   midodrine 5 MG tablet Commonly known as: PROAMATINE Take 3 tablets (15 mg total) by mouth 3 (three) times daily with meals.   multivitamin with minerals tablet Take 1 tablet by mouth daily.   Oxycodone HCl 10 MG Tabs Take 1 tablet (10 mg total) by  mouth every 6 (six) hours as needed for severe pain.   polyethylene glycol 17 g packet Commonly known as: MIRALAX / GLYCOLAX Take 17 g by mouth 2 (two) times daily.   Renvela 2.4 g Pack Generic drug: sevelamer carbonate Take 4.8 g by mouth in the morning, at noon, and at bedtime.   senna-docusate 8.6-50 MG tablet Commonly known as: Senokot-S Take 1 tablet by mouth 2 (two) times daily.               Discharge Care Instructions  (From admission, onward)           Start     Ordered   11/29/22 0000  Discharge wound care:       Comments: As per wound care   11/29/22 1034            Follow-up Information     [facility] Follow up in 4 week(s).   Specialty: Vascular Surgery Why: Office will call you to arrange your appt (sent). Contact information: [address] [place] [address] [phone]        Edwin Cap, DPM. Schedule an appointment as soon as possible for a visit in 4 week(s).   Specialty: Podiatry Why: For hypertrophic toenail after discharge from CIR Contact information: [address] [place] [address] [phone]  Allergies  Allergen Reactions   Ciprofloxacin Other (See Comments)    Aortic dissection   Heparin Other (See Comments)    UNSPECIFIED REACTION :  On Coumadin since 2004   HIT panel negative 01/19/17   Doxercalciferol Other (See Comments)   Quinolones Other (See Comments)    unknown    Consultations: Nephrology, PCCM, vascular surgery, podiatry   Procedures/Studies: DG CHEST PORT 1 VIEW  Result Date: 11/28/2022 CLINICAL DATA:  Hypoxia. EXAM: PORTABLE CHEST 1 VIEW COMPARISON:  11/11/2022 FINDINGS: There is a right chest wall dialysis catheter with tips in the right atrium. Stable cardiomediastinal contours. Diffuse pulmonary edema is unchanged in the interval. Areas of subsegmental atelectasis and scarring is noted within the lower  lung zones bilaterally. This appears similar to previous exam. Suspect small left pleural effusion. IMPRESSION: 1. No significant interval change. 2. Diffuse pulmonary edema. 3. Bibasilar subsegmental atelectasis and scarring. Electronically Signed   By: Signa Kell M.D.   On: 11/28/2022 13:03   ECHOCARDIOGRAM COMPLETE  Result Date: 11/16/2022    ECHOCARDIOGRAM REPORT   Patient Name:   [name] Date of Exam: 11/16/2022 Medical Rec #:  [id number]    Height:       75.0 in Accession #:    [id number]   Weight:       129.9 lb Date of Birth:  Apr 09, 1970    BSA:          1.826 m Patient Age:    52 years     BP:           84/53 mmHg Patient Gender: M            HR:           62 bpm. Exam Location:  Inpatient Procedure: 2D Echo, Cardiac Doppler and Color Doppler Indications:    Preoperative evaluation  History:        Patient has prior history of Echocardiogram examinations, most                 recent 05/20/2018. CHF, Arrythmias:Atrial Fibrillation,                 Signs/Symptoms:Chest Pain; Risk Factors:Hypertension. ESRD.  Sonographer:    Lucendia Herrlich Referring Phys: [id number] TAYE T GONFA IMPRESSIONS  1. Left ventricular ejection fraction, by estimation, is 50 to 55%. The left ventricle has low normal function. The left ventricle has no regional wall motion abnormalities. Left ventricular diastolic parameters are indeterminate. There is the interventricular septum is flattened in systole and diastole, consistent with right ventricular pressure and volume overload.  2. Right ventricular systolic function is severely reduced. The right ventricular size is severely enlarged. Tricuspid regurgitation signal is inadequate for assessing PA pressure.  3. Left atrial size was mildly dilated.  4. Right atrial size was severely dilated.  5. The mitral valve is normal in structure. No evidence of mitral valve regurgitation. No evidence of mitral stenosis.  6. The aortic valve is tricuspid. Aortic valve regurgitation is not  visualized. No aortic stenosis is present.  7. The inferior vena cava is normal in size with <50% respiratory variability, suggesting right atrial pressure of 8 mmHg. FINDINGS  Left Ventricle: Left ventricular ejection fraction, by estimation, is 50 to 55%. The left ventricle has low normal function. The left ventricle has no regional wall motion abnormalities. The left ventricular internal cavity size was normal in size. There is no left ventricular hypertrophy. The interventricular septum is flattened in systole and diastole,  consistent with right ventricular pressure and volume overload. Left ventricular diastolic parameters are indeterminate. Right Ventricle: The right ventricular size is severely enlarged. No increase in right ventricular wall thickness. Right ventricular systolic function is severely reduced. Tricuspid regurgitation signal is inadequate for assessing PA pressure. The tricuspid regurgitant velocity is 2.57 m/s, and with an assumed right atrial pressure of 8 mmHg, the estimated right ventricular systolic pressure is 34.4 mmHg. Left Atrium: Left atrial size was mildly dilated. Right Atrium: Right atrial size was severely dilated. Pericardium: Trivial pericardial effusion is present. Mitral Valve: The mitral valve is normal in structure. No evidence of mitral valve regurgitation. No evidence of mitral valve stenosis. Tricuspid Valve: The tricuspid valve is normal in structure. Tricuspid valve regurgitation is mild . No evidence of tricuspid stenosis. Aortic Valve: The aortic valve is tricuspid. Aortic valve regurgitation is not visualized. No aortic stenosis is present. Aortic valve peak gradient measures 3.5 mmHg. Pulmonic Valve: The pulmonic valve was normal in structure. Pulmonic valve regurgitation is trivial. No evidence of pulmonic stenosis. Aorta: The aortic root is normal in size and structure. Venous: The inferior vena cava is normal in size with less than 50% respiratory variability,  suggesting right atrial pressure of 8 mmHg. IAS/Shunts: There is left bowing of the interatrial septum, suggestive of elevated right atrial pressure. No atrial level shunt detected by color flow Doppler.  LEFT VENTRICLE PLAX 2D LVIDd:         3.80 cm   Diastology LVIDs:         2.30 cm   LV e' medial:    6.01 cm/s LV PW:         1.10 cm   LV E/e' medial:  8.7 LV IVS:        1.10 cm   LV e' lateral:   11.30 cm/s LVOT diam:     2.20 cm   LV E/e' lateral: 4.6 LV SV:         47 LV SV Index:   26 LVOT Area:     3.80 cm  RIGHT VENTRICLE             IVC RV S prime:     10.30 cm/s  IVC diam: 2.00 cm TAPSE (M-mode): 1.4 cm LEFT ATRIUM             Index        RIGHT ATRIUM           Index LA diam:        4.40 cm 2.41 cm/m   RA Area:     23.80 cm LA Vol (A2C):   42.9 ml 23.49 ml/m  RA Volume:   83.30 ml  45.61 ml/m LA Vol (A4C):   62.3 ml 34.11 ml/m LA Biplane Vol: 56.2 ml 30.77 ml/m  AORTIC VALVE                 PULMONIC VALVE AV Area (Vmax): 3.02 cm     PR End Diast Vel: 14.14 msec AV Vmax:        92.90 cm/s AV Peak Grad:   3.5 mmHg LVOT Vmax:      73.90 cm/s LVOT Vmean:     44.100 cm/s LVOT VTI:       0.123 m  AORTA Ao Root diam: 3.50 cm Ao Asc diam:  3.10 cm MITRAL VALVE               TRICUSPID VALVE MV Area (PHT): 2.80 cm  TR Peak grad:   26.4 mmHg MV Decel Time: 271 msec    TR Vmax:        257.00 cm/s MV E velocity: 52.20 cm/s MV A velocity: 25.70 cm/s  SHUNTS MV E/A ratio:  2.03        Systemic VTI:  0.12 m                            Systemic Diam: 2.20 cm Olga Millers MD Electronically signed by Olga Millers MD Signature Date/Time: 11/16/2022/4:07:01 PM    Final    CT CHEST W CONTRAST  Result Date: 11/14/2022 CLINICAL DATA:  Chest wall mass EXAM: CT CHEST WITH CONTRAST TECHNIQUE: Multidetector CT imaging of the chest was performed during intravenous contrast administration. RADIATION DOSE REDUCTION: This exam was performed according to the departmental dose-optimization program which includes  automated exposure control, adjustment of the mA and/or kV according to patient size and/or use of iterative reconstruction technique. CONTRAST:  75mL OMNIPAQUE IOHEXOL 350 MG/ML SOLN COMPARISON:  Chest CT dated November 08, 2022; CTA chest, abdomen and pelvis dated October 27, 2022 FINDINGS: Cardiovascular: Cardiomegaly with right heart enlargement. Right central venous catheter with tip in the right atrium. Dilated main pulmonary artery, measuring up to 3.6 cm. Severe coronary artery calcifications. Partially visualized dissection of the descending thoracic aorta originating near the takeoff of the left subclavian artery, unchanged when compared with the prior exam. Unchanged dilation of the descending thoracic aorta, measuring up to 4.5 cm. Mediastinum/Nodes: No enlarged lymph nodes seen in the chest. Esophagus and thyroid are unremarkable. Lungs/Pleura: Central airways are patent. Mild diffuse ground-glass opacities and septal thickening. Consolidations of the bilateral lower lungs which are likely due to atelectasis. Trace left-greater-than-right pleural effusions. Upper Abdomen: Innumerable cysts of varying complexity of the partially visualized right kidney, unchanged when compared with the prior exam. Gallstones. No acute abnormality. Musculoskeletal: Subcutaneous mass of the anterior upper left chest wall measuring 3.2 x 2.2 cm, unchanged in size when compared with multiple prior exams dating back to November 27, 2019. Diffuse osseous sclerosis, likely sequela of chronic renal disease. No aggressive appearing osseous lesions. IMPRESSION: 1. Subcutaneous mass of the anterior upper left chest wall, unchanged when compared with multiple prior exams dating back to November 27, 2019, possibly old hematoma. Recommend soft ultrasound for better evaluation. 2. Pulmonary edema and trace left-greater-than-right pleural effusions. 3. Type B aortic dissection, not significantly changed when compared with the prior CTA. 4.  Dilated main pulmonary artery, which can be seen in the setting of pulmonary hypertension. 5. Cardiomegaly and severe coronary artery calcifications. Electronically Signed   By: Allegra Lai M.D.   On: 11/14/2022 19:04   DG CHEST PORT 1 VIEW  Result Date: 11/11/2022 CLINICAL DATA:  Chest tube removal. EXAM: PORTABLE CHEST 1 VIEW COMPARISON:  Chest radiograph dated 11/10/2022. FINDINGS: Right-sided catheter in similar position. There is cardiomegaly with vascular congestion and edema. Bibasilar atelectasis or infiltrate. No large pleural effusion. No pneumothorax. No acute osseous pathology. IMPRESSION: Cardiomegaly with vascular congestion and edema.  No pneumothorax. Electronically Signed   By: Elgie Collard M.D.   On: 11/11/2022 17:52   DG CHEST PORT 1 VIEW  Result Date: 11/10/2022 CLINICAL DATA:  Chest tube. EXAM: PORTABLE CHEST 1 VIEW COMPARISON:  November 08, 2022. FINDINGS: Stable cardiomegaly. Right internal jugular dialysis catheter is unchanged. Stable diffuse reticular opacities are noted concerning for pulmonary edema with bibasilar atelectasis or infiltrates. Stable left-sided pleural  drainage catheter without definite pneumothorax. Minimal left pleural effusion may be present. Bony thorax is unremarkable. IMPRESSION: Stable bilateral lung opacities as noted above. Stable position of left-sided pleural drainage catheter without definite pneumothorax. Electronically Signed   By: Lupita Raider M.D.   On: 11/10/2022 08:53   CT CHEST WO CONTRAST  Result Date: 11/08/2022 CLINICAL DATA:  Pneumonia, complications suspected. EXAM: CT CHEST WITHOUT CONTRAST TECHNIQUE: Multidetector CT imaging of the chest was performed following the standard protocol without IV contrast. RADIATION DOSE REDUCTION: This exam was performed according to the departmental dose-optimization program which includes automated exposure control, adjustment of the mA and/or kV according to patient size and/or use of iterative  reconstruction technique. COMPARISON:  Chest radiograph 11/08/2022 and chest CT 11/05/2022 and CT 10/27/2022 FINDINGS: Cardiovascular: Right jugular dialysis catheter with the tip in the right atrium. Heart is enlarged. Diffuse coronary artery calcifications. Again noted is enlargement of the distal aortic arch and descending thoracic aorta. Again noted is a dissection involving the descending thoracic aorta compatible with a type B aortic dissection. Proximal descending thoracic aorta measures roughly 4.6 cm in diameter and minimally changed. Mid descending thoracic aorta roughly measures 3.8 cm and similar to the previous examination. Limited evaluation of the dissection without intravascular contrast. Distal descending thoracic aorta roughly measures 3.6 cm and minimally changed. Mediastinum/Nodes: Again noted are multiple small mediastinal lymph nodes that are similar to the recent comparison examination. Question enlarged left internal mammary lymph node versus venous structures on image 51/3. Small axillary lymph nodes bilaterally. No gross abnormality to the esophagus. Small amount of low-density material fluid along the anterior mediastinum on image 60/3 is similar to the recent comparison examination. Lungs/Pleura: Left pleural effusion has decreased in size. Small amount of residual loculated left pleural fluid particularly in the left upper chest on image 35/3. The left pigtail chest tube is still in place. Tube has slightly retracted since the previous CT. Aeration in the left lower lobe has slightly improved but there continues to be air bronchograms and consolidation throughout the left lower lobe. Persistent areas of peripheral consolidation in the right lower lobe. Septal thickening with ground-glass densities in both lungs particularly in the right upper lobe. Findings could represent asymmetric pulmonary edema but cannot exclude infection. Upper Abdomen: Limited evaluation of the upper abdominal  structures. Musculoskeletal: Diffuse sclerosis involving the vertebral bodies most compatible with renal osteodystrophy. No acute bone abnormality. IMPRESSION: 1. Loculated left pleural effusion has decreased in size. Small amount of residual left pleural fluid. Left chest tube is still in place but has been slightly retracted. Negative for a pneumothorax. 2. Persistent consolidation in both lower lobes, left side greater than right. Slightly improved aeration in the left lower lobe since 11/05/2022. 3. Ground-glass densities and scattered areas of septal thickening in both lungs particularly in the right upper lobe. These findings could be related edema and/or infection. 4. Mildly prominent lymph nodes throughout the chest are nonspecific but could be reactive. 5. Type B aortic dissection. No significant change in the size or configuration of descending thoracic aorta. Electronically Signed   By: Richarda Overlie M.D.   On: 11/08/2022 15:42   DG CHEST PORT 1 VIEW  Result Date: 11/08/2022 CLINICAL DATA:  52 year old male with left chest tube, type B thoracic aortic dissection, pleural effusion,. EXAM: PORTABLE CHEST 1 VIEW COMPARISON:  Portable chest 11/07/2022 and earlier. FINDINGS: Portable AP semi upright view at 1006 hours. Right chest dual lumen dialysis type catheter and partially visible axillary  vascular stent appears stable. Stable lung volumes and mediastinal contours. Stable left lung base pigtail chest tube. No pneumothorax. Ongoing coarse bilateral pulmonary interstitial opacity, and patchy left greater than right lung base opacity is stable from yesterday. Visualized tracheal air column is within normal limits. Stable visualized osseous structures. Negative visible bowel gas. IMPRESSION: 1.  Stable lines and tubes.  No pneumothorax. 2. Stable coarse pulmonary interstitial opacity and left greater than right lung base opacity. Favor a combination of pulmonary interstitial edema, lower lobe collapse or  consolidation, and small residual left pleural effusion. Electronically Signed   By: Odessa Fleming M.D.   On: 11/08/2022 12:53   DG CHEST PORT 1 VIEW  Result Date: 11/07/2022 CLINICAL DATA:  Pleural effusion EXAM: PORTABLE CHEST 1 VIEW COMPARISON:  11/06/2022 FINDINGS: Left basilar chest tube remains in place. Small left pleural effusion, slightly decreased. Slightly improving aeration of the left lung base. Bilateral airspace opacities and right basilar atelectasis persist. Stable heart size. Right IJ central venous catheter remains positioned at the level of the right atrium. No pneumothorax. IMPRESSION: 1. Small left pleural effusion, slightly decreased. Slightly improving aeration of the left lung base. 2. Bilateral airspace opacities and right basilar atelectasis persist. Electronically Signed   By: Duanne Guess D.O.   On: 11/07/2022 11:29   DG Abd Portable 1V  Result Date: 11/06/2022 CLINICAL DATA:  Abdominal pain EXAM: PORTABLE ABDOMEN - 1 VIEW COMPARISON:  None Available. FINDINGS: Vascular catheter tip at the inferior right atrium. IVC filter in place. Widespread medium to small vessel vascular calcification. Bowel gas pattern does not suggest obstruction. Atelectasis/infiltrate is present in both lower lobes. IMPRESSION: No evidence of bowel obstruction. IVC filter in place. Widespread medium to small vessel vascular calcification. Electronically Signed   By: Paulina Fusi M.D.   On: 11/06/2022 17:23   DG Chest Port 1 View  Result Date: 11/06/2022 CLINICAL DATA:  Follow-up chest tube. EXAM: PORTABLE CHEST 1 VIEW COMPARISON:  CT 11/05/2022 FINDINGS: Stable position of left basilar chest tube. No pneumothorax. Right chest wall dialysis catheter noted with tips in the right atrium. The cardiomediastinal contours are unremarkable. Diffuse increased reticular interstitial opacities are noted throughout both lungs compatible with interstitial edema. The left pleural effusion is unchanged in volume compared  with 11/02/2022. Similar appearance of bilateral lower lobe atelectasis and airspace disease. IMPRESSION: 1. Stable position of left basilar chest tube. No pneumothorax. 2. Unchanged left pleural effusion and bilateral lower lobe atelectasis and airspace disease. 3. Diffuse interstitial edema. Electronically Signed   By: Signa Kell M.D.   On: 11/06/2022 09:28   CT CHEST WO CONTRAST  Result Date: 11/05/2022 CLINICAL DATA:  Pneumonia. EXAM: CT CHEST WITHOUT CONTRAST TECHNIQUE: Multidetector CT imaging of the chest was performed following the standard protocol without IV contrast. RADIATION DOSE REDUCTION: This exam was performed according to the departmental dose-optimization program which includes automated exposure control, adjustment of the mA and/or kV according to patient size and/or use of iterative reconstruction technique. COMPARISON:  Chest x-ray dated November 02, 2022. CT chest dated October 27, 2022. FINDINGS: Cardiovascular: Unchanged cardiomegaly. No significant pericardial effusion. Grossly unchanged type B thoracic aortic dissection with aneurysmal dilatation of the proximal descending thoracic aorta up to 4.5 cm in diameter. No ascending thoracic aortic aneurysm. Coronary, aortic arch, and branch vessel atherosclerotic vascular disease. Unchanged tunneled right internal jugular dialysis catheter with tip in the right atrium. Mediastinum/Nodes: No enlarged mediastinal or axillary lymph nodes. Thyroid gland, trachea, and esophagus demonstrate no significant  findings. Lungs/Pleura: New left-sided chest tube centrally positioned at the left lung base. Moderate loculated left pleural effusion has mildly increased in size. Unchanged near complete collapse of the left lower lobe. Worsening smooth interlobular septal thickening, particularly in the right lung, with new small ill-defined ground-glass densities. New trace amount of loculated pleural fluid along the right minor fissure. Increasing atelectasis at  the right lung base. Similar atelectasis in the lingula. No consolidation or pneumothorax. Upper Abdomen: New small perihepatic ascites. Musculoskeletal: No acute or significant osseous findings. Sequelae of renal osteodystrophy again noted. IMPRESSION: 1. New left-sided chest tube centrally positioned at the left lung base. Moderate loculated left pleural effusion has mildly increased in size. Correlate with tube function. 2. Unchanged near complete collapse of the left lower lobe. Increasing atelectasis at the right lung base. 3. Worsening pulmonary edema.  New small perihepatic ascites. 4. Grossly unchanged type B thoracic aortic dissection with aneurysmal dilatation of the proximal descending thoracic aorta up to 4.5 cm in diameter. 5.  Aortic atherosclerosis (ICD10-I70.0). Electronically Signed   By: Obie Dredge M.D.   On: 11/05/2022 11:17   DG CHEST PORT 1 VIEW  Result Date: 11/02/2022 CLINICAL DATA:  Status post chest tube placement EXAM: PORTABLE CHEST 1 VIEW COMPARISON:  Chest x-ray 10/27/2022 FINDINGS: New left pleural drainage catheter is seen in the lower left hemithorax. Small left pleural effusion persists. Tiny component of pneumothorax is seen in the left costophrenic angle adjacent to the catheter likely related to recent catheter placement. There is a small left pleural effusion which is unchanged. Left mid and lower lung airspace disease and right basilar atelectasis appear stable. There is diffuse interstitial prominence throughout both lungs similar to the prior study. The cardiomediastinal silhouette is unchanged, the aorta is tortuous in the heart is enlarged. Right-sided central venous catheter tip projects over the distal SVC. Vascular stent is seen in the right upper arm. No acute fractures are identified. IMPRESSION: 1. New left pleural drainage catheter in place with persistent small left pleural effusion and tiny pneumothorax in the left costophrenic angle. 2. Stable left mid and  lower lung airspace disease and right basilar atelectasis. 3. Stable diffuse interstitial prominence throughout both lungs. Electronically Signed   By: Darliss Cheney M.D.   On: 11/02/2022 17:23   VAS [place] ABI WITH/WO TBI  Result Date: 10/31/2022  LOWER EXTREMITY DOPPLER STUDY Patient Name:  GARHETT VALLA  Date of Exam:   10/31/2022 Medical Rec #: [id number]     Accession #:    [id number] Date of Birth: 26-Jul-1970     Patient Gender: M Patient Age:   3 years Exam Location:  [facility] Procedure:      VAS [place] ABI WITH/WO TBI Referring Phys: Cristal Deer DICKSON --------------------------------------------------------------------------------  Indications: Peripheral artery disease. High Risk Factors: Hypertension. Other Factors: TAAA without rupture, AAA, ESRD.  Limitations: Today's exam was limited due to skin changes. Technically difficult              examination. Performing Technologist: Jean Rosenthal RDMS RVT  Examination Guidelines: A complete evaluation includes at minimum, Doppler waveform signals and systolic blood pressure reading at the level of bilateral brachial, anterior tibial, and posterior tibial arteries, when vessel segments are accessible. Bilateral testing is considered an integral part of a complete examination. Photoelectric Plethysmograph (PPG) waveforms and toe systolic pressure readings are included as required and additional duplex testing as needed. Limited examinations for reoccurring indications may be performed as noted.  ABI Findings: +---------+------------------+-----+----------+--------------------------------+  Right    Rt Pressure (mmHg)IndexWaveform  Comment                          +---------+------------------+-----+----------+--------------------------------+ Brachial                        triphasic History of AVFs- unable to                                                 obtain pressure                   +---------+------------------+-----+----------+--------------------------------+ PTA                             monophasic                                 +---------+------------------+-----+----------+--------------------------------+ DP                              monophasic                                 +---------+------------------+-----+----------+--------------------------------+ Great Toe                                 Unable to obtain signal                                                    secondary to toenail overgrowth  +---------+------------------+-----+----------+--------------------------------+ +---------+------------------+-----+------------------+------------------------+ Left     Lt Pressure (mmHg)IndexWaveform          Comment                  +---------+------------------+-----+------------------+------------------------+ Brachial 101                    triphasic                                  +---------+------------------+-----+------------------+------------------------+ PTA                             audibly monophasicPoor signal secondary to                                                   overlying skin                                                             thickening               +---------+------------------+-----+------------------+------------------------+  DP                              monophasic                                 +---------+------------------+-----+------------------+------------------------+ Great Toe                                         Unable to obtain signal                                                    secondary to toenail                                                       overgrowth               +---------+------------------+-----+------------------+------------------------+ Arterial wall calcification precludes accurate ankle pressures and ABIs.  Summary:  Right: Resting right ankle-brachial index indicates noncompressible right lower extremity arteries. Left: Resting left ankle-brachial index indicates noncompressible left lower extremity arteries. *See table(s) above for measurements and observations.  Electronically signed by Gerarda Fraction on 10/31/2022 at 6:01:15 PM.    Final       Subjective: Patient seen and examined at bedside today.  Hemodynamically stable.  Lying in bed.  Seen at dialysis suite.  Complains of some pain on the left  AKA stump.  I again called his brother [name] for update about discharge, call not received  Discharge Exam: Vitals:   11/29/22 1000 11/29/22 1030  BP: 103/65 103/62  Pulse: 68 64  Resp: 20 16  Temp:    SpO2: 99% 100%   Vitals:   11/29/22 0900 11/29/22 0930 11/29/22 1000 11/29/22 1030  BP: 101/66 (!) 97/58 103/65 103/62  Pulse: 64 68 68 64  Resp: 19 (!) 24 20 16   Temp:      TempSrc:      SpO2: 100% 100% 99% 100%  Weight:      Height:        General: Pt is alert, awake, not in acute distress Cardiovascular: RRR, S1/S2 +, no rubs, no gallops.  Dialysis catheter on the right chest Respiratory: CTA bilaterally, no wheezing, no rhonchi Abdominal: Soft, NT, ND, bowel sounds + Extremities: no edema, no cyanosis.  Left AKA    The results of significant diagnostics from this hospitalization (including imaging, microbiology, ancillary and laboratory) are listed below for reference.     Microbiology: No results found for this or any previous visit (from the past 240 hour(s)).   Labs: BNP (last 3 results) No results for input(s): "BNP" in the last 8760 hours. Basic Metabolic Panel: Recent Labs  Lab 11/26/22 0330 11/28/22 0900 11/29/22 0352  NA 135 138 137  K 4.3 3.6 4.3  CL 96* 99 102  CO2 21* 25 23  GLUCOSE 88 100* 92  BUN 31* 30* 39*  CREATININE 4.93* 5.19* 5.76*  CALCIUM 6.6* 6.6* 6.6*  PHOS  --  2.0*  --  Liver Function Tests: Recent Labs  Lab 11/28/22 0900  ALBUMIN 2.4*    No results for input(s): "LIPASE", "AMYLASE" in the last 168 hours. No results for input(s): "AMMONIA" in the last 168 hours. CBC: Recent Labs  Lab 11/26/22 0330 11/28/22 0900 11/29/22 0352  WBC 9.7 10.9* 10.3  HGB 8.4* 8.4* 8.1*  HCT 29.2* 30.0* 28.2*  MCV 94.8 95.5 94.6  PLT 266 274 273   Cardiac Enzymes: No results for input(s): "CKTOTAL", "CKMB", "CKMBINDEX", "TROPONINI" in the last 168 hours. BNP: Invalid input(s): "POCBNP" CBG: No results for input(s): "GLUCAP" in the last 168 hours. D-Dimer No results for input(s): "DDIMER" in the last 72 hours. Hgb A1c No results for input(s): "HGBA1C" in the last 72 hours. Lipid Profile No results for input(s): "CHOL", "HDL", "LDLCALC", "TRIG", "CHOLHDL", "LDLDIRECT" in the last 72 hours. Thyroid function studies No results for input(s): "TSH", "T4TOTAL", "T3FREE", "THYROIDAB" in the last 72 hours.  Invalid input(s): "FREET3" Anemia work up No results for input(s): "VITAMINB12", "FOLATE", "FERRITIN", "TIBC", "IRON", "RETICCTPCT" in the last 72 hours. Urinalysis No results found for: "COLORURINE", "APPEARANCEUR", "LABSPEC", "PHURINE", "GLUCOSEU", "HGBUR", "BILIRUBINUR", "KETONESUR", "PROTEINUR", "UROBILINOGEN", "NITRITE", "LEUKOCYTESUR" Sepsis Labs Recent Labs  Lab 11/26/22 0330 11/28/22 0900 11/29/22 0352  WBC 9.7 10.9* 10.3   Microbiology No results found for this or any previous visit (from the past 240 hour(s)).  Please note: You were cared for by a hospitalist during your hospital stay. Once you are discharged, your primary care physician will handle any further medical issues. Please note that NO REFILLS for any discharge medications will be authorized once you are discharged, as it is imperative that you return to your primary care physician (or establish a relationship with a primary care physician if you do not have one) for your post hospital discharge needs so that they can reassess your need for medications and  monitor your lab values.    Time coordinating discharge: 40 minutes  SIGNED:   Burnadette Pop, MD  [facility] 11/29/2022, 10:39 AM Pager [phone]  If 7PM-7AM, please contact night-coverage [email or web address] Password [email or web address]

## 2022-11-29 NOTE — Plan of Care (Signed)
Problem: Education: Goal: Knowledge of General Education information will improve Description: Including pain rating scale, medication(s)/side effects and non-pharmacologic comfort measures Outcome: Progressing   Problem: Health Behavior/Discharge Planning: Goal: Ability to manage health-related needs will improve Outcome: Progressing   Problem: Clinical Measurements: Goal: Ability to maintain clinical measurements within normal limits will improve Outcome: Progressing Goal: Will remain free from infection Outcome: Progressing Goal: Diagnostic test results will improve Outcome: Progressing Goal: Respiratory complications will improve Outcome: Progressing Goal: Cardiovascular complication will be avoided Outcome: Progressing   Problem: Activity: Goal: Risk for activity intolerance will decrease Outcome: Progressing   Problem: Nutrition: Goal: Adequate nutrition will be maintained Outcome: Progressing   Problem: Coping: Goal: Level of anxiety will decrease Outcome: Progressing   Problem: Elimination: Goal: Will not experience complications related to bowel motility Outcome: Progressing Goal: Will not experience complications related to urinary retention Outcome: Progressing   Problem: Pain Managment: Goal: General experience of comfort will improve Outcome: Progressing   Problem: Safety: Goal: Ability to remain free from injury will improve Outcome: Progressing   Problem: Skin Integrity: Goal: Risk for impaired skin integrity will decrease Outcome: Progressing   Problem: Education: Goal: Knowledge of the prescribed therapeutic regimen will improve Outcome: Progressing Goal: Ability to verbalize activity precautions or restrictions will improve Outcome: Progressing Goal: Understanding of discharge needs will improve Outcome: Progressing   Problem: Activity: Goal: Ability to perform//tolerate increased activity and mobilize with assistive devices will  improve Outcome: Progressing   Problem: Clinical Measurements: Goal: Postoperative complications will be avoided or minimized Outcome: Progressing   Problem: Self-Care: Goal: Ability to meet self-care needs will improve Outcome: Progressing   Problem: Self-Concept: Goal: Ability to maintain and perform role responsibilities to the fullest extent possible will improve Outcome: Progressing   Problem: Pain Management: Goal: Pain level will decrease with appropriate interventions Outcome: Progressing   Problem: Skin Integrity: Goal: Demonstration of wound healing without infection will improve Outcome: Progressing   Problem: Education: Goal: Knowledge of General Education information will improve Description: Including pain rating scale, medication(s)/side effects and non-pharmacologic comfort measures Outcome: Progressing   Problem: Health Behavior/Discharge Planning: Goal: Ability to manage health-related needs will improve Outcome: Progressing   Problem: Clinical Measurements: Goal: Ability to maintain clinical measurements within normal limits will improve Outcome: Progressing Goal: Will remain free from infection Outcome: Progressing Goal: Diagnostic test results will improve Outcome: Progressing Goal: Respiratory complications will improve Outcome: Progressing Goal: Cardiovascular complication will be avoided Outcome: Progressing   Problem: Activity: Goal: Risk for activity intolerance will decrease Outcome: Progressing   Problem: Nutrition: Goal: Adequate nutrition will be maintained Outcome: Progressing   Problem: Coping: Goal: Level of anxiety will decrease Outcome: Progressing   Problem: Elimination: Goal: Will not experience complications related to bowel motility Outcome: Progressing Goal: Will not experience complications related to urinary retention Outcome: Progressing   Problem: Pain Managment: Goal: General experience of comfort will  improve Outcome: Progressing   Problem: Safety: Goal: Ability to remain free from injury will improve Outcome: Progressing   Problem: Skin Integrity: Goal: Risk for impaired skin integrity will decrease Outcome: Progressing   Problem: Education: Goal: Knowledge of the prescribed therapeutic regimen will improve Outcome: Progressing Goal: Ability to verbalize activity precautions or restrictions will improve Outcome: Progressing Goal: Understanding of discharge needs will improve Outcome: Progressing   Problem: Activity: Goal: Ability to perform//tolerate increased activity and mobilize with assistive devices will improve Outcome: Progressing   Problem: Clinical Measurements: Goal: Postoperative complications will be avoided or minimized Outcome: Progressing  Problem: Self-Care: Goal: Ability to meet self-care needs will improve Outcome: Progressing   Problem: Self-Concept: Goal: Ability to maintain and perform role responsibilities to the fullest extent possible will improve Outcome: Progressing   Problem: Pain Management: Goal: Pain level will decrease with appropriate interventions Outcome: Progressing   Problem: Skin Integrity: Goal: Demonstration of wound healing without infection will improve Outcome: Progressing

## 2022-11-29 NOTE — Progress Notes (Signed)
Inpatient Rehabilitation Admission Medication Review by a Pharmacist   A complete drug regimen review was completed for this patient to identify any potential clinically significant medication issues.   High Risk Drug Classes Is patient taking? Indication by Medication  Antipsychotic No Compazine - N/V  Anticoagulant Yes Apixaban - AF  Antibiotic No    Opioid Yes Oxycodone - acute pain  Antiplatelet No    Hypoglycemics/insulin No    Vasoactive Medication No    Chemotherapy No    Other Yes Acetaminophen - mild pain Amiodarone - AF Ambien - sleep Benadryl/hydroxizine - itching Aranesp - anemia Gabapentin - neuropathy Lidocaine patch - musculoskeletal pain Robaxin - spasms Milk of molasses - constipation Renvela - phos binder MVI - supplement Calcium carbonate/Maalox - indigestion  Midodrine - BP support Senna-doc, miralax, docusate, bisacodyl, milk of molassa enema, fleet enema        Type of Medication Issue Identified Description of Issue Recommendation(s)  Drug Interaction(s) (clinically significant)        Duplicate Therapy    Calcium carbonate and maalox both for indigestion Milk and molassas and fleet enema both for severe constipation   Discontinue maalox and fleet enema given ESRD  Allergy        No Medication Administration End Date        Incorrect Dose        Additional Drug Therapy Needed        Significant med changes from prior encounter (inform family/care partners about these prior to discharge).  NEW apixaban, gabapentin, midodrine  STOP warfarin   Review at discharge  Other            Clinically significant medication issues were identified that warrant physician communication and completion of prescribed/recommended actions by midnight of the next day:  No   Name of provider notified for urgent issues identified:    Provider Method of Notification:        Pharmacist comments:    Time spent performing this drug regimen review (minutes):   20   Alphia Moh, PharmD, BCPS, [facility] Clinical Pharmacist  Please check AMION for all [facility] Pharmacy phone numbers After 10:00 PM, call Main Pharmacy [phone]

## 2022-11-29 NOTE — Progress Notes (Signed)
Received patient in bed to unit.  Alert and oriented.  Informed consent signed and in chart.   TX duration:3.5   Patient tolerated well.  Transported back to the room  Alert, without acute distress.  Hand-off given to patient's nurse.   Access used: R [facility] Access issues: NA  Total UF removed: 2L  Medication(s) given: None   11/29/22 1150  Vitals  Temp 98.6 F (37 C)  Temp Source Oral  BP 110/71  MAP (mmHg) 81  Pulse Rate 72  ECG Heart Rate 68  Resp 12  Oxygen Therapy  SpO2 91 %  O2 Device Nasal Cannula  O2 Flow Rate (L/min) 2 L/min  Patient Activity (if Appropriate) In bed  Pulse Oximetry Type Continuous  During Treatment Monitoring  HD Safety Checks Performed Yes  Intra-Hemodialysis Comments Tx completed  Dialysis Fluid Bolus Normal Saline  Bolus Amount (mL) 300 mL      Lerry Liner LPN [facility]

## 2022-11-29 NOTE — TOC Transition Note (Signed)
Transition of Care [facility]) - CM/SW Discharge Note   Patient Details  Name: Corey [name] MRN: [id number] Date of Birth: 1970/05/20  [facility] CM/SW Contact:  Leone Haven, RN Phone Number: 11/29/2022, 1:07 PM   Clinical Narrative:    Per Otto Herb with CIR they have a bed today for patient.      Barriers to Discharge: Continued Medical Work up   Patient Goals and CMS Choice      Discharge Placement                         Discharge Plan and Services Additional resources added to the After Visit Summary for                                       Social Determinants of Health (SDOH) Interventions SDOH Screenings   Food Insecurity: No Food Insecurity (10/27/2022)  Housing: Medium Risk (10/27/2022)  Transportation Needs: No Transportation Needs (10/27/2022)  Utilities: Not At Risk (10/27/2022)  Alcohol Screen: Low Risk  (09/26/2022)  Depression (PHQ2-9): Medium Risk (09/26/2022)  Financial Resource Strain: Medium Risk (09/26/2022)  Physical Activity: Inactive (09/26/2022)  Social Connections: Socially Isolated (09/26/2022)  Stress: No Stress Concern Present (09/26/2022)  Tobacco Use: Low Risk  (11/17/2022)     Readmission Risk Interventions     No data to display

## 2022-11-29 NOTE — Progress Notes (Signed)
Patient is alert and oriented x4. Refuses q2h turns due to intolerance. Swallows pills one by one without issues. Takes robaxin and tylenol for pain. Refused narcotics and stool softeners. Patient stated that the narcotics were causing constipation and although the stool softeners would help, the cycle would began again afterwards. Patient had several bowel movements on yesterday. Denied additional needs.

## 2022-11-29 NOTE — Progress Notes (Signed)
Clarified x2 that pt is to DC with both peripheral ivs with Tiffanie.

## 2022-11-29 NOTE — H&P (Signed)
Physical Medicine and Rehabilitation Admission H&P    Chief Complaint  Patient presents with   Functional deficits due to debility from multiple medical issues   L-AKA    HPI:  Corey [name] is a 52 year old male with history of ESRD-HD TTS, calciphylaxis, stargardt disease with low vision, PAF-chronic coumadin, aortic dissection s/p repair, PAD, neuropathy who missed HD sessions X 2 due to nausea,vomiting and diarrhea. He presented to ED on 10/27/22 with hypoxic respiratory failure, hyperkalemia and complex moderate to large left pleural effusion. He underwent emergent HD.  PCCM recommended chest tube once INR trended down. CT abdomen pelvis done due to abdominal pain and showed stable type B aortic dissection with stable penetrating ulcer. Patient had declined surgery in the past. Dr. Edilia Bo consulted for input on aneurysm as well as BLE with chronic venous stasis changes and evidence of PAD with diminished pulse right femoral artery. As patient non-ambulatory without symptoms, ABI ordered  revealing non compressible veins. He declined recommendations of BLE amputation and Eucerin cream recommended with follow up in a year for CTA abdomen. Left clavicular SQ mass stable since [id number] and felt to be possible old hematoma.    Vitamin K administered 7/30 due to slow drop in INR, CT placed on 07/31 by Dr. Denese Killings. Fluid felt to be parapneumonic with negative cultures and PNA treated with 10 day course of IV rocephin/5 days of Zithromax and transitioned to Augmentin thru 08/14.  Follow up CT chest 08/03 showed mild increase in left pleural effusion with no change in near complete collapse of LLL  and required intrapleural fibrinolysis X 3 doses. He has had issues with hypotension requiring midodrine and additional dose of IV albumin.  Once drainage decreased with decrease in size of left effusion, CT removed on 08/06. SOB improving and therapy was working with patient who has been limited by weakness  and left flank pain. He developed new left heel wound and was agreeable to L-AKA by Dr. Myra Gianotti on 08/15 and felt to be high risk for future R-AKA.  Post op transitioned to Eliquis, oxygen being weaned off, post op pain slowly improving, hypotension continues to be an issue but orthostasis improving. Due to ongoing issues with hypoxia,  CXR ordered 08/26 showed diffuse pulmonary edema. He underwent HD today with IV albumin to assist with 2 kg goal. Therapy has been working with patient who is significantly debilitated due to medical issues and is working on sitting tolerance. CIR recommended due to functional decline.   Pt reports pain is pretty bad- residual limb pain- throbs/exploding feeling.  But phantom pain also bad- cramping in calf and L ankle/foot.  Pain meds took edge off, but stopped taking pain meds most of time due to constipation.   Also having intermittent nausea, no vomiting.  BM finally today x2 this AM- after suppository- finally started going.   Has old fistulas, but needs to use R chest HD catheter to be dialyzed.     Review of Systems  Constitutional:  Negative for chills and fever.  HENT:  Negative for hearing loss and tinnitus.   Eyes:  Positive for blurred vision.  Respiratory:  Positive for cough. Negative for shortness of breath.   Cardiovascular:  Negative for chest pain and palpitations.  Gastrointestinal:  Positive for abdominal pain (severe on LLQ prior to BM/defacating) and nausea. Negative for heartburn.  Musculoskeletal:  Positive for myalgias.  Skin:  Negative for rash.  Neurological:  Positive for sensory change (decreased  sensation from saddle down--feels pressure only) and weakness. Negative for dizziness and headaches.  Psychiatric/Behavioral:  The patient has insomnia.   All other systems reviewed and are negative.    Past Medical History:  Diagnosis Date   Arthritis    hands and shoulders   Blindness and low vision    "Stargardt disease"    Dissection of aorta (HCC) 2004   a. s/p extensive repeair in 2004 in [place] complicated by ESRD, lower extremity paralysis, coma, and extended hospitalization of 2 years   ESRD (end stage renal disease) (HCC)    a. TTS   Headache    History of cardioversion 2014   Hypertension    Neuropathy    Non-healing non-surgical wound 03/2016   PAF (paroxysmal atrial fibrillation) (HCC)    a. s/p DCCV in 2014; b. on Coumadin; c. CHADS2VASc => 2 (HTN, vascular disease)   Paralysis (HCC)    due to dissection of aorta in 2004, lower extremities   Pneumonia     Past Surgical History:  Procedure Laterality Date   AMPUTATION Left 11/17/2022   Procedure: AMPUTATION ABOVE KNEE;  Surgeon: Nada Libman, MD;  Location: [facility] OR;  Service: Vascular;  Laterality: Left;   APPLICATION OF WOUND VAC Left 04/13/2016   Procedure: APPLICATION OF WOUND VAC;  Surgeon: Fransisco Hertz, MD;  Location: [facility] OR;  Service: Vascular;  Laterality: Left;   APPLICATION OF WOUND VAC Left 04/18/2016   Procedure: APPLICATION OF WOUND VAC;  Surgeon: Maeola Harman, MD;  Location: [facility] OR;  Service: Vascular;  Laterality: Left;  Wound vac change    APPLICATION OF WOUND VAC Left 04/20/2016   Procedure: WOUND VAC CHANGE;  Surgeon: Fransisco Hertz, MD;  Location: [facility] OR;  Service: Vascular;  Laterality: Left;   AV FISTULA PLACEMENT     BASCILIC VEIN TRANSPOSITION Left 12/09/2015   Procedure: FIRST STAGE BASILIC VEIN TRANSPOSITION LEFT UPPER ARM;  Surgeon: Fransisco Hertz, MD;  Location: [facility] OR;  Service: Vascular;  Laterality: Left;   BASCILIC VEIN TRANSPOSITION Left 03/09/2016   Procedure: SECOND STAGE BASILIC VEIN TRANSPOSITION WITH REVISION OF ANASTOMOSIS LEFT UPPER ARM;  Surgeon: Fransisco Hertz, MD;  Location: [facility] OR;  Service: Vascular;  Laterality: Left;   CARDIOVERSION     CARDIOVERSION N/A 01/04/2019   Procedure: CARDIOVERSION;  Surgeon: Lewayne Bunting, MD;  Location: [facility] ENDOSCOPY;  Service: Cardiovascular;  Laterality: N/A;   CHEST TUBE  INSERTION Left 11/02/2022   Procedure: CHEST TUBE INSERTION;  Surgeon: Lynnell Catalan, MD;  Location: [facility] ENDOSCOPY;  Service: Pulmonary;  Laterality: Left;  Please have both Pigtail (COOK) and chest tube tray available   REPAIR OF ACUTE ASCENDING THORACIC AORTIC DISSECTION     REVISON OF ARTERIOVENOUS FISTULA Left 04/20/2016   Procedure: LIGATION OF BASILIC VEIN TRANSPOSITION;  Surgeon: Fransisco Hertz, MD;  Location: [facility] OR;  Service: Vascular;  Laterality: Left;   WOUND DEBRIDEMENT Left 04/13/2016   Procedure: DEBRIDEMENT WOUND;  Surgeon: Fransisco Hertz, MD;  Location: [facility] OR;  Service: Vascular;  Laterality: Left;    Family History  Problem Relation Age of Onset   Cancer Mother    Hypertension Mother    Cancer Father    Hypertension Father    Thyroid disease Sister    Stroke Brother        67    Social History:  Lives with brother (who has had multiple stroke and limited). 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 Cousin checks in. He  reports that he has never smoked. He has never used smokeless tobacco. He reports that he does not drink alcohol and does not use drugs.   Allergies  Allergen Reactions   Ciprofloxacin Other (See Comments)    Aortic dissection   Heparin Other (See Comments)    UNSPECIFIED REACTION :  On Coumadin since 2004   HIT panel negative 01/19/17   Doxercalciferol Other (See Comments)   Quinolones Other (See Comments)    unknown    Medications Prior to Admission  Medication Sig Dispense Refill   amiodarone (PACERONE) 200 MG tablet Take 1 tablet (200 mg total) by mouth daily. 90 tablet 3   cinacalcet (SENSIPAR) 60 MG tablet Take 120 mg by mouth every evening.     Multiple Vitamins-Minerals (MULTIVITAMIN WITH MINERALS) tablet Take 1 tablet by mouth daily.     RENVELA 2.4 g PACK Take 4.8 g by mouth in the morning, at noon, and at bedtime.     warfarin (COUMADIN) 2 MG tablet Take 1 tablet by mouth daily or as directed by Anticoagulation  Clinic. 30 tablet 0   docusate sodium (COLACE) 100 MG capsule Take 1 capsule (100 mg total) by mouth every 12 (twelve) hours. (Patient not taking: Reported on 10/27/2022) 60 capsule 0   metoprolol tartrate (LOPRESSOR) 25 MG tablet Take 0.5 tablets (12.5 mg total) by mouth 2 (two) times daily. Hold on the AM of dialysis days (Patient not taking: Reported on 10/27/2022) 90 tablet 3     Home: Home Living Family/patient expects to be discharged to:: Private residence Living Arrangements: Other relatives (brother) Available Help at Discharge: Family, Available 24 hours/day Type of Home: House Home Access: Ramped entrance Home Layout: One level Bathroom Shower/Tub: Tub/shower unit, Sponge bathes at baseline Bathroom Toilet: Standard Bathroom Accessibility: Yes Home Equipment: BSC/3in1, Wheelchair - manual  Lives With: Family (brother)   Functional History: Prior Function Prior Level of Function : Needs assist Physical Assist : ADLs (physical), Mobility (physical) Mobility (physical): Transfers (occasionally, but was independent with transfers) ADLs (physical): IADLs Mobility Comments: mod indep w/c level mobility ADLs Comments: sponges bathes at baseline; brother assists with higher-level ADLs and iADLs as needed  Functional Status:  Mobility: Bed Mobility Overal bed mobility: Needs Assistance Bed Mobility: Supine to Sit Rolling: Supervision Sidelying to sit: Min assist Supine to sit: Min assist Sit to supine: Supervision, Used rails Sit to sidelying: Contact guard assist General bed mobility comments: pt in chair upon PT arrival Transfers Overall transfer level: Needs assistance Equipment used: None Transfers: Bed to chair/wheelchair/BSC Bed to/from chair/wheelchair/BSC transfer type:: Lateral/scoot transfer  Lateral/Scoot Transfers: Min assist General transfer comment: pt deferred going back to bed due to wanting to eat lunch in chair Ambulation/Gait General Gait Details:  unable    ADL: ADL Overall ADL's : Needs assistance/impaired Eating/Feeding: Modified independent, Sitting Eating/Feeding Details (indicate cue type and reason): Orientation secondary to low vision Grooming: Modified independent, Sitting Grooming Details (indicate cue type and reason): Orientation secondary to low vision Upper Body Bathing: Modified independent, Sitting Lower Body Bathing: Contact guard assist, Sitting/lateral leans, Cueing for compensatory techniques Lower Body Bathing Details (indicate cue type and reason): for thoroughness Upper Body Dressing : Modified independent, Sitting Lower Body Dressing: Moderate assistance, Sitting/lateral leans Lower Body Dressing Details (indicate cue type and reason): donning socks, poor skin integrity and painful Toilet Transfer: Minimal assistance, Transfer board, Requires drop arm Toilet Transfer Details (indicate cue type and reason): min A for lateral scoot  transfer to recliner Toileting- Clothing Manipulation and Hygiene: Minimal assistance, Sitting/lateral lean Toileting - Clothing Manipulation Details (indicate cue type and reason): for thoroughness Functional mobility during ADLs: Minimal assistance General ADL Comments: Pt with decreased activity tolerance. Pt requires orientation to environment when items are moved in his room secondary to low vision.  Cognition: Cognition Overall Cognitive Status: Within Functional Limits for tasks assessed Orientation Level: Oriented X4 Cognition Arousal: Alert Behavior During Therapy: WFL for tasks assessed/performed Overall Cognitive Status: Within Functional Limits for tasks assessed General Comments: Pt AAOx4 and pleasant throughout session.   Blood pressure 103/62, pulse 64, temperature 98 F (36.7 C), temperature source Oral, resp. rate 16, height 6\' 3"  (1.905 m), weight 58.6 kg, SpO2 100%. Physical Exam Vitals and nursing note reviewed.  Constitutional:      Comments: Thin ill  appearing male with 2 L oxygen per Rule. NAD Sitting up in bed wearing eyeglasses that don't work per pt- "for show".  C/o pain in L AKA and LLE.   HENT:     Head: Normocephalic and atraumatic.     Right Ear: External ear normal.     Left Ear: External ear normal.     Nose: Nose normal. No congestion.     Mouth/Throat:     Mouth: Mucous membranes are dry.     Pharynx: Oropharynx is clear. No oropharyngeal exudate.  Eyes:     General:        Right eye: No discharge.        Left eye: No discharge.     Comments: Injected. Does not make eye contact but looks in general direction.  Says can see shapes, but nothing clear  Cardiovascular:     Rate and Rhythm: Normal rate and regular rhythm.     Heart sounds: Normal heart sounds. No murmur heard.    No gallop.  Pulmonary:     Effort: Pulmonary effort is normal. No respiratory distress.     Breath sounds: No wheezing, rhonchi or rales.     Comments: Decreased at bases R>L even though chest tube was on L side Abdominal:     General: Abdomen is flat. There is no distension.     Palpations: Abdomen is soft.     Tenderness: There is no abdominal tenderness.  Musculoskeletal:        General: No swelling or tenderness.     Cervical back: Neck supple. No tenderness.     Comments: Amputation site with staples in place and pillow under AKA with multiple dried areas of serous drainage. Right foot with overgrown toe nails.   Ues 5-/5 B/L RLE- HF 4-/5; otherwise DF 0/5 and PF and KE 2-/5 LLE- L AKA- with HF 4-/5   Skin:    General: Skin is warm and dry.     Comments: Boggy area infra clavicular area--non tender and reported to be stable. Multiple grafts RUE--non functional. R-internal jugular noted. RLE with areas of discoloration and stasis changes.  R HD catheter looks OK 2 IV"s LUE- look OK L aka has staples intact- dog ears notable, not shaping so far Not significant edema, however Leg/ L AKA- tip elevated on pillow- said per Surgeon   Neurological:     Mental Status: He is alert and oriented to person, place, and time.     Comments: Decreased to absent below R knee  Psychiatric:        Mood and Affect: Mood normal.        Behavior: Behavior  normal.     Results for orders placed or performed during the hospital encounter of 10/27/22 (from the past 48 hour(s))  Renal function panel     Status: Abnormal   Collection Time: 11/28/22  9:00 AM  Result Value Ref Range   Sodium 138 135 - 145 mmol/L   Potassium 3.6 3.5 - 5.1 mmol/L   Chloride 99 98 - 111 mmol/L   CO2 25 22 - 32 mmol/L   Glucose, Bld 100 (H) 70 - 99 mg/dL    Comment: Glucose reference range applies only to samples taken after fasting for at least 8 hours.   BUN 30 (H) 6 - 20 mg/dL   Creatinine, Ser 1.61 (H) 0.61 - 1.24 mg/dL   Calcium 6.6 (L) 8.9 - 10.3 mg/dL   Phosphorus 2.0 (L) 2.5 - 4.6 mg/dL   Albumin 2.4 (L) 3.5 - 5.0 g/dL   GFR, Estimated 13 (L) >60 mL/min    Comment: (NOTE) Calculated using the CKD-EPI Creatinine Equation (2021)    Anion gap 14 5 - 15    Comment: Performed at [facility], [address] [place], [place] [address]  CBC     Status: Abnormal   Collection Time: 11/28/22  9:00 AM  Result Value Ref Range   WBC 10.9 (H) 4.0 - 10.5 K/uL   RBC 3.14 (L) 4.22 - 5.81 MIL/uL   Hemoglobin 8.4 (L) 13.0 - 17.0 g/dL   HCT 54.0 (L) 98.1 - 19.1 %   MCV 95.5 80.0 - 100.0 fL   MCH 26.8 26.0 - 34.0 pg   MCHC 28.0 (L) 30.0 - 36.0 g/dL   RDW 47.8 (H) 29.5 - 62.1 %   Platelets 274 150 - 400 K/uL   nRBC 0.0 0.0 - 0.2 %    Comment: Performed at [facility], [address] [place], [place] [address]  Basic metabolic panel     Status: Abnormal   Collection Time: 11/29/22  3:52 AM  Result Value Ref Range   Sodium 137 135 - 145 mmol/L   Potassium 4.3 3.5 - 5.1 mmol/L   Chloride 102 98 - 111 mmol/L   CO2 23 22 - 32 mmol/L   Glucose, Bld 92 70 - 99 mg/dL    Comment: Glucose reference range applies only to samples taken after  fasting for at least 8 hours.   BUN 39 (H) 6 - 20 mg/dL   Creatinine, Ser 7.84 (H) 0.61 - 1.24 mg/dL   Calcium 6.6 (L) 8.9 - 10.3 mg/dL   GFR, Estimated 11 (L) >60 mL/min    Comment: (NOTE) Calculated using the CKD-EPI Creatinine Equation (2021)    Anion gap 12 5 - 15    Comment: Performed at [facility], [address], [place], [place] [address]  CBC     Status: Abnormal   Collection Time: 11/29/22  3:52 AM  Result Value Ref Range   WBC 10.3 4.0 - 10.5 K/uL   RBC 2.98 (L) 4.22 - 5.81 MIL/uL   Hemoglobin 8.1 (L) 13.0 - 17.0 g/dL   HCT 52.8 (L) 41.3 - 24.4 %   MCV 94.6 80.0 - 100.0 fL   MCH 27.2 26.0 - 34.0 pg   MCHC 28.7 (L) 30.0 - 36.0 g/dL   RDW 01.0 (H) 27.2 - 53.6 %   Platelets 273 150 - 400 K/uL   nRBC 0.2 0.0 - 0.2 %    Comment: Performed at [facility], [address],  [place], [place] [address]   DG CHEST PORT 1 VIEW  Result Date: 11/28/2022 CLINICAL DATA:  Hypoxia. EXAM: PORTABLE CHEST 1 VIEW COMPARISON:  11/11/2022 FINDINGS: There is a right chest wall dialysis catheter with tips in the right atrium. Stable cardiomediastinal contours. Diffuse pulmonary edema is unchanged in the interval. Areas of subsegmental atelectasis and scarring is noted within the lower lung zones bilaterally. This appears similar to previous exam. Suspect small left pleural effusion. IMPRESSION: 1. No significant interval change. 2. Diffuse pulmonary edema. 3. Bibasilar subsegmental atelectasis and scarring. Electronically Signed   By: Signa Kell M.D.   On: 11/28/2022 13:03      Blood pressure 103/62, pulse 64, temperature 98 F (36.7 C), temperature source Oral, resp. rate 16, height 6\' 3"  (1.905 m), weight 58.6 kg, SpO2 100%.  Medical Problem List and Plan: 1. Functional deficits secondary to  L AKA  -patient may not shower due to R HD catheter  -ELOS/Goals: 12-14 days supervision Admit to CIR- suggest mirror therapy for phantom pain control- also likely needs shrinkers  x2 2.  Antithrombotics: -DVT/anticoagulation:  Pharmaceutical: Eliquis. IVC filter in place.   -antiplatelet therapy: N/A 3. Pain Management: oxycodone prn. Pt hasn't been taking much since constipation Suggest mirror therapy 4. Mood/Behavior/Sleep: LCSW to follow for evaluation and support.   -antipsychotic agents: N/A 5. Neuropsych/cognition: This patient is capable of making decisions on his own behalf. 6. Skin/Wound Care: Routine pressure relief measure 7. Fluids/Electrolytes/Nutrition:  Strict I/O. Daily weights. Diet changed to Renal per input from nephrology. 8. L-AKA: Monitor incision for healing. Dry dressing.  9. ESRD: Renal diet with 1200 cc FR/HD TTS at the end of the day to help with tolerance of therapy.  10. H/o AAA aneurysm rupture s/p repair:  with resultant paraplegia, sensory deficits from saddle down and cauda equina syndrome  --was able to walk till a few years ago when left knee gave out.   --has severe LLQ pain prior to defecation. Continue Senna/miralax--discussed suppository every other day but patient wants to think on it "that's too much" 11. Anemia of chronic disease:] 12. L- PNA and parapneumonic effusion s/p CT: Has completed 2 week course of antibiotics.  13. Fluid overload: Continue to monitor for symptoms and wean oxygen as tolerated.  14. Neurogenic bowel: Will monitor for now.   15. Neuropathic pain- will change gabapentin to 300 mg QHS          Jacquelynn Cree, PA-C 11/29/2022   I have personally performed a face to face diagnostic evaluation of this patient and formulated the key components of the plan.  Additionally, I have personally reviewed laboratory data, imaging studies, as well as relevant notes and concur with the physician assistant's documentation above.   The patient's status has not changed from the original H&P.  Any changes in documentation from the acute care chart have been noted above.

## 2022-11-29 NOTE — Procedures (Signed)
Seen and examined on dialysis.  Procedure supervised.  Blood pressure 103/65 and HR 67.  Tolerating goal.    Estanislado Emms, MD 11/29/2022  10:12 AM

## 2022-11-30 ENCOUNTER — Encounter (HOSPITAL_COMMUNITY): Payer: Self-pay | Admitting: Physical Medicine and Rehabilitation

## 2022-11-30 ENCOUNTER — Other Ambulatory Visit: Payer: Self-pay

## 2022-11-30 DIAGNOSIS — Z89612 Acquired absence of left leg above knee: Secondary | ICD-10-CM | POA: Diagnosis not present

## 2022-11-30 MED ORDER — AMITRIPTYLINE HCL 10 MG PO TABS
10.0000 mg | ORAL_TABLET | Freq: Every day | ORAL | Status: DC
  Administered 2022-11-30: 10 mg via ORAL
  Filled 2022-11-30: qty 1

## 2022-11-30 MED ORDER — CHLORHEXIDINE GLUCONATE CLOTH 2 % EX PADS
6.0000 | MEDICATED_PAD | Freq: Two times a day (BID) | CUTANEOUS | Status: DC
  Administered 2022-11-30 – 2022-12-14 (×25): 6 via TOPICAL

## 2022-11-30 MED ORDER — BACITRACIN ZINC 500 UNIT/GM EX OINT
TOPICAL_OINTMENT | Freq: Two times a day (BID) | CUTANEOUS | Status: DC
  Administered 2022-11-30 – 2022-12-04 (×4): 31.5 via TOPICAL
  Administered 2022-12-05: 1 via TOPICAL
  Administered 2022-12-05 – 2022-12-07 (×4): 31.5 via TOPICAL
  Administered 2022-12-07: 1 via TOPICAL
  Administered 2022-12-08 – 2022-12-14 (×10): 31.5 via TOPICAL
  Filled 2022-11-30: qty 28.4

## 2022-11-30 MED ORDER — CHLORHEXIDINE GLUCONATE CLOTH 2 % EX PADS: 6.0000 | MEDICATED_PAD | Freq: Every day | CUTANEOUS | Status: DC

## 2022-11-30 MED ORDER — ENSURE ENLIVE PO LIQD
237.0000 mL | Freq: Two times a day (BID) | ORAL | Status: DC
  Administered 2022-11-30 – 2022-12-13 (×11): 237 mL via ORAL

## 2022-11-30 NOTE — Progress Notes (Incomplete)
Patient ID: Corey [name], male   DOB: 1971/03/08, 52 y.o.   MRN: [id number]  This SW covering for primary SW, [name].   SW spoke with pt brother [name] to provide updates from team conference, and d/c date 9/8. He is concerned that his brother will need support and should not be doing anything to strenuous and would like for him to have an aide. SW shared can place referral for PCS and explained limitations as services can take up to 45 days to be put in place. He intends to discuss in more detail with his brother. States there are no other supports other than him sinc eall their family is in [place].   *SW met with pt in room to provide updates from team conference.   26- SW spoke with pt dtr [name] to discuss discharhge plan. She confirms pt will d/c to her mother's home in [place].   [name], MSW, LCSWA Office: [phone] Cell: [phone] Fax: [phone]

## 2022-11-30 NOTE — Evaluation (Signed)
Physical Therapy Assessment and Plan  Patient Details  Name: Corey [name] MRN: [id number] Date of Birth: 1971-01-27  PT Diagnosis: Abnormal posture, Abnormality of gait, Contracture of joint: L hip, Difficulty walking, Edema, Impaired sensation, Muscle weakness, and Pain in L residual limb Rehab Potential: Good ELOS: 10-12 days   Today's Date: 11/30/2022 PT Individual Time: 0815-0928 PT Individual Time Calculation (min): 73 min    Hospital Problem: Principal Problem:   S/P AKA (above knee amputation), left (HCC) Active Problems:   ESRD on dialysis (HCC)   Protein-calorie malnutrition, severe   Debility   Past Medical History:  Past Medical History:  Diagnosis Date   Arthritis    hands and shoulders   Blindness and low vision    "Stargardt disease"   Dissection of aorta (HCC) 2004   a. s/p extensive repeair in 2004 in [place] complicated by ESRD, lower extremity paralysis, coma, and extended hospitalization of 2 years   ESRD (end stage renal disease) (HCC)    a. TTS   Headache    History of cardioversion 2014   Hypertension    Neuropathy    Non-healing non-surgical wound 03/2016   PAF (paroxysmal atrial fibrillation) (HCC)    a. s/p DCCV in 2014; b. on Coumadin; c. CHADS2VASc => 2 (HTN, vascular disease)   Paralysis (HCC)    due to dissection of aorta in 2004, lower extremities   Pneumonia    Past Surgical History:  Past Surgical History:  Procedure Laterality Date   AMPUTATION Left 11/17/2022   Procedure: AMPUTATION ABOVE KNEE;  Surgeon: Nada Libman, MD;  Location: [facility] OR;  Service: Vascular;  Laterality: Left;   APPLICATION OF WOUND VAC Left 04/13/2016   Procedure: APPLICATION OF WOUND VAC;  Surgeon: Fransisco Hertz, MD;  Location: [facility] OR;  Service: Vascular;  Laterality: Left;   APPLICATION OF WOUND VAC Left 04/18/2016   Procedure: APPLICATION OF WOUND VAC;  Surgeon: Maeola Harman, MD;  Location: [facility] OR;  Service: Vascular;  Laterality: Left;  Wound vac change     APPLICATION OF WOUND VAC Left 04/20/2016   Procedure: WOUND VAC CHANGE;  Surgeon: Fransisco Hertz, MD;  Location: [facility] OR;  Service: Vascular;  Laterality: Left;   AV FISTULA PLACEMENT     BASCILIC VEIN TRANSPOSITION Left 12/09/2015   Procedure: FIRST STAGE BASILIC VEIN TRANSPOSITION LEFT UPPER ARM;  Surgeon: Fransisco Hertz, MD;  Location: [facility] OR;  Service: Vascular;  Laterality: Left;   BASCILIC VEIN TRANSPOSITION Left 03/09/2016   Procedure: SECOND STAGE BASILIC VEIN TRANSPOSITION WITH REVISION OF ANASTOMOSIS LEFT UPPER ARM;  Surgeon: Fransisco Hertz, MD;  Location: [facility] OR;  Service: Vascular;  Laterality: Left;   CARDIOVERSION     CARDIOVERSION N/A 01/04/2019   Procedure: CARDIOVERSION;  Surgeon: Lewayne Bunting, MD;  Location: [facility] ENDOSCOPY;  Service: Cardiovascular;  Laterality: N/A;   CHEST TUBE INSERTION Left 11/02/2022   Procedure: CHEST TUBE INSERTION;  Surgeon: Lynnell Catalan, MD;  Location: [facility] ENDOSCOPY;  Service: Pulmonary;  Laterality: Left;  Please have both Pigtail (COOK) and chest tube tray available   REPAIR OF ACUTE ASCENDING THORACIC AORTIC DISSECTION     REVISON OF ARTERIOVENOUS FISTULA Left 04/20/2016   Procedure: LIGATION OF BASILIC VEIN TRANSPOSITION;  Surgeon: Fransisco Hertz, MD;  Location: [facility] OR;  Service: Vascular;  Laterality: Left;   WOUND DEBRIDEMENT Left 04/13/2016   Procedure: DEBRIDEMENT WOUND;  Surgeon: Fransisco Hertz, MD;  Location: [facility] OR;  Service: Vascular;  Laterality: Left;  Assessment & Plan Clinical Impression: Patient is a 52 y.o. year old male with history of ESRD-HD TTS, calciphylaxis, stargardt disease with low vision, PAF-chronic coumadin, aortic dissection s/p repair, PAD, neuropathy who missed HD sessions X 2 due to nausea,vomiting and diarrhea. He presented to ED on 10/27/22 with hypoxic respiratory failure, hyperkalemia and complex moderate to large left pleural effusion. He underwent emergent HD.  PCCM recommended chest tube once INR trended down. CT abdomen pelvis  done due to abdominal pain and showed stable type B aortic dissection with stable penetrating ulcer. Patient had declined surgery in the past. Dr. Edilia Bo consulted for input on aneurysm as well as BLE with chronic venous stasis changes and evidence of PAD with diminished pulse right femoral artery. As patient non-ambulatory without symptoms, ABI ordered  revealing non compressible veins. He declined recommendations of BLE amputation and Eucerin cream recommended with follow up in a year for CTA abdomen. Left clavicular SQ mass stable since 11914 and felt to be possible old hematoma.     Vitamin K administered 7/30 due to slow drop in INR, CT placed on 07/31 by Dr. Denese Killings. Fluid felt to be parapneumonic with negative cultures and PNA treated with 10 day course of IV rocephin/5 days of Zithromax and transitioned to Augmentin thru 08/14.  Follow up CT chest 08/03 showed mild increase in left pleural effusion with no change in near complete collapse of LLL  and required intrapleural fibrinolysis X 3 doses. He has had issues with hypotension requiring midodrine and additional dose of IV albumin.  Once drainage decreased with decrease in size of left effusion, CT removed on 08/06. SOB improving and therapy was working with patient who has been limited by weakness and left flank pain. He developed new left heel wound and was agreeable to L-AKA by Dr. Myra Gianotti on 08/15 and felt to be high risk for future R-AKA.  Post op transitioned to Eliquis, oxygen being weaned off, post op pain slowly improving, hypotension continues to be an issue but orthostasis improving. Due to ongoing issues with hypoxia,  CXR ordered 08/26 showed diffuse pulmonary edema. He underwent HD today with IV albumin to assist with 2 kg goal. Therapy has been working with patient who is significantly debilitated due to medical issues and is working on sitting tolerance. CIR recommended due to functional decline.   Patient currently requires min with  mobility secondary to muscle weakness and muscle joint tightness, decreased cardiorespiratoy endurance and decreased oxygen support, and decreased standing balance, decreased postural control, decreased balance strategies, and difficulty maintaining precautions.  Prior to hospitalization, patient was modified independent  with mobility and lived with Family (brother) in a House home.  Home access is  Ramped entrance.  Patient will benefit from skilled PT intervention to maximize safe functional mobility, minimize fall risk, and decrease caregiver burden for planned discharge home with 24 hour supervision.  Anticipate patient will benefit from follow up HH at discharge.  PT - End of Session Activity Tolerance: Tolerates 30+ min activity with multiple rests Endurance Deficit: Yes Endurance Deficit Description: frequent rest breaks needed PT Assessment Rehab Potential (ACUTE/IP ONLY): Good PT Barriers to Discharge: Decreased caregiver support;Neurogenic Bowel & Bladder;Wound Care;Hemodialysis;Weight bearing restrictions;Other (comments) PT Barriers to Discharge Comments: pain, HD Tu/Th/Sat, lives with brother who is unable to assist physically, and NWB LLE. PT Patient demonstrates impairments in the following area(s): Balance;Edema;Endurance;Motor;Nutrition;Pain;Perception;Sensory;Skin Integrity PT Transfers Functional Problem(s): Bed Mobility;Bed to Chair;Car;Furniture PT Locomotion Functional Problem(s): Ambulation;Wheelchair Mobility;Stairs PT Plan PT Intensity: Minimum of  1-2 x/day ,45 to 90 minutes PT Frequency: 5 out of 7 days PT Duration Estimated Length of Stay: 10-12 days PT Treatment/Interventions: Ambulation/gait training;Discharge planning;Functional mobility training;Psychosocial support;Therapeutic Activities;Visual/perceptual remediation/compensation;Balance/vestibular training;Disease management/prevention;Neuromuscular re-education;Skin care/wound management;Therapeutic  Exercise;Wheelchair propulsion/positioning;DME/adaptive equipment instruction;Pain management;Splinting/orthotics;UE/LE Strength taining/ROM;Community reintegration;Patient/family education;Stair training;UE/LE Coordination activities PT Transfers Anticipated Outcome(s): mod I with LRAD PT Locomotion Anticipated Outcome(s): N/A PT Recommendation Recommendations for Other Services: Neuropsych consult;Therapeutic Recreation consult Therapeutic Recreation Interventions: Stress management Follow Up Recommendations: Home health PT Patient destination: Home Equipment Recommended: To be determined  PT Evaluation Precautions/Restrictions Precautions Precautions: Fall;Other (comment) Precaution Comments: watch BP; blind in L eye Restrictions Weight Bearing Restrictions: Yes LLE Weight Bearing: Non weight bearing Pain Interference Pain Interference Pain Effect on Sleep: 4. Almost constantly Pain Interference with Therapy Activities: 2. Occasionally Pain Interference with Day-to-Day Activities: 1. Rarely or not at all Home Living/Prior Functioning Home Living Available Help at Discharge: Family;Available 24 hours/day (bother is limited phyiscally (had multiple strokes and doesn't ambulate well)) Type of Home: House Home Access: Ramped entrance Home Layout: One level Bathroom Shower/Tub: Tub/shower unit;Sponge bathes at baseline [employer]: Standard (has push up rails and built up seat) Bathroom Accessibility: Yes Additional Comments: pt has WC that he got ~1 year ago, rollator, and crutches at home. Pt reports he has not been ambulatory for year prior to amputation due to "blowing his knee out"  Lives With: Family (brother) Prior Function Level of Independence: Requires assistive device for independence  Able to Take Stairs?: No Driving: No Vision/Perception  Vision - History Ability to See in Adequate Light: 4 Severely impaired Perception Perception: Impaired Preception  Impairment Details: Spatial orientation Perception-Other Comments: blindness limiting perception; follows voice but often over reaches Praxis Praxis: WFL  Cognition Overall Cognitive Status: Within Functional Limits for tasks assessed Arousal/Alertness: Awake/alert Orientation Level: Oriented X4 Memory: Appears intact Awareness: Appears intact Problem Solving: Appears intact Safety/Judgment: Appears intact Sensation Sensation Light Touch: Impaired Detail Light Touch Impaired Details: Impaired RLE;Impaired LLE Hot/Cold: Not tested Proprioception: Impaired by gross assessment Stereognosis: Not tested Additional Comments: Pt reports phantom pain, severe BLE neuropathy described as legs being "on fire" and "explosions of pain going from legs up to lower back". Impaired sensation LLE>RLE. Coordination Gross Motor Movements are Fluid and Coordinated: No Fine Motor Movements are Fluid and Coordinated: No Coordination and Movement Description: grossly uncoordinated due to altered balance strategies from L AKA, being non ambulatory for years prior to ampuation, and pain Finger Nose Finger Test: impaired bilaterally due to blindness Heel Shin Test: unable to perform on LLE due to AKA Motor  Motor Motor: Abnormal postural alignment and control Motor - Skilled Clinical Observations: altered balance strategies from L AKA, pain, and blind in L eye  Trunk/Postural Assessment  Cervical Assessment Cervical Assessment: Exceptions to [facility] (forward head) Thoracic Assessment Thoracic Assessment: Exceptions to [facility] (thoracic rounding) Lumbar Assessment Lumbar Assessment: Exceptions to [facility] (posterior pelvic tilt) Postural Control Postural Control: Deficits on evaluation Protective Responses: delayed on L  Balance Balance Balance Assessed: Yes Static Sitting Balance Static Sitting - Balance Support: Feet supported;Bilateral upper extremity supported Static Sitting - Level of Assistance: 6:  Modified independent (Device/Increase time) Dynamic Sitting Balance Dynamic Sitting - Balance Support: Feet supported;No upper extremity supported Dynamic Sitting - Level of Assistance: 5: Stand by assistance (supervision) Extremity Assessment  RLE Assessment RLE Assessment: Not tested General Strength Comments: grossly 3/5 LLE Assessment LLE Assessment: Not tested (pain)  Care Tool Care Tool Bed Mobility Roll left and right activity  Roll left and right assist level: Supervision/Verbal cueing    Sit to lying activity        Lying to sitting on side of bed activity   Lying to sitting on side of bed assist level: the ability to move from lying on the back to sitting on the side of the bed with no back support.: Minimal Assistance - Patient > 75%     Care Tool Transfers Sit to stand transfer Sit to stand activity did not occur: Safety/medical concerns (pain, fear of falling)      Chair/bed transfer   Chair/bed transfer assist level: Minimal Assistance - Patient > 75% (slideboard)     Toilet transfer   Assist Level: Minimal Assistance - Patient > 75% (slideboard)    Licensed conveyancer transfer activity did not occur: Safety/medical concerns (pain, fear of falling)        Care Tool Locomotion Ambulation Ambulation activity did not occur: Safety/medical concerns (pain, fear of falling, decreased balance, protection of contralateral limb)        Walk 10 feet activity Walk 10 feet activity did not occur: Safety/medical concerns (pain, fear of falling, decreased balance, protection of contralateral limb)       Walk 50 feet with 2 turns activity Walk 50 feet with 2 turns activity did not occur: Safety/medical concerns (pain, fear of falling, decreased balance, protection of contralateral limb)      Walk 150 feet activity Walk 150 feet activity did not occur: Safety/medical concerns (pain, fear of falling, decreased balance, protection of contralateral limb)      Walk 10 feet  on uneven surfaces activity Walk 10 feet on uneven surfaces activity did not occur: Safety/medical concerns (pain, fear of falling, decreased balance, protection of contralateral limb)      Stairs Stair activity did not occur: Safety/medical concerns (pain, fear of falling, decreased balance, protection of contralateral limb)        Walk up/down 1 step activity Walk up/down 1 step or curb (drop down) activity did not occur: Safety/medical concerns (pain, fear of falling, decreased balance, protection of contralateral limb)      Walk up/down 4 steps activity Walk up/down 4 steps activity did not occur: Safety/medical concerns (pain, fear of falling, decreased balance, protection of contralateral limb)      Walk up/down 12 steps activity Walk up/down 12 steps activity did not occur: Safety/medical concerns (pain, fear of falling, decreased balance, protection of contralateral limb)      Pick up small objects from floor Pick up small object from the floor (from standing position) activity did not occur: Safety/medical concerns (pain, fear of falling, decreased balance)      Wheelchair Is the patient using a wheelchair?: Yes Type of Wheelchair: Manual Wheelchair activity did not occur: Safety/medical concerns (pain)      Wheel 50 feet with 2 turns activity Wheelchair 50 feet with 2 turns activity did not occur: Safety/medical concerns (pain)    Wheel 150 feet activity Wheelchair 150 feet activity did not occur: Safety/medical concerns (pain)      Refer to Care Plan for Long Term Goals  SHORT TERM GOAL WEEK 1 PT Short Term Goal 1 (Week 1): pt will perform bed mobility with supervision PT Short Term Goal 2 (Week 1): pt will transfer bed<>chair with LRAD and CGA PT Short Term Goal 3 (Week 1): pt will transfer sit<>stand with LRAD and min A  Recommendations for other services: Neuropsych and Therapeutic Recreation  Stress management  Skilled Therapeutic  Intervention Evaluation  completed (see details above and below) with education on PT POC and goals and individual treatment initiated with focus on functional mobility/transfers, generalized strengthening and endurance, and dressing. Received pt semi-reclined in bed, pt educated on PT evaluation, CIR policies, and therapy schedule and agreeable. Pt reported pain 8/10 in L residual limb but declined pain medication and reports he hasn't slept in past 4-5 nights. Pt on 2L O2 via [place] with SPO2 98% at rest. Pt also reports new onset pain in genital region, upon inspection noted small ulcer with mild drainage - plan for urine sample/culture to determine infection. Provided pt with 18x18 manual WC with L amputee support pad and Roho cushion for improved skin integrity while RN administered medication and MD arrived for morning rounds.  Requested shrinker for L residual limb (noted residual limb longer as pt is 37ft 3in). Ace wrapped L residual limb and donned pants in supine with max A to thread LEs through but pt able to pull pants over hips without assist. Pt transferred semi-reclined<>sitting EOB with HOB slightly elevated and min A for trunk support. Pt transferred bed<>WC via slideboard with min A overall. Initally started transfer without board, but due to fear and anticipation of pain, retreived board. Concluded session with pt sitting in WC, needs within reach, and seatbelt alarm on. Pt let on 1L O2 with SPO2 93%. Safety plan updated and RN notified.   Mobility Bed Mobility Bed Mobility: Rolling Left;Supine to Sit Rolling Left: Supervision/Verbal cueing Supine to Sit: Minimal Assistance - Patient > 75% Transfers Transfers: Lateral/Scoot Transfers Lateral/Scoot Transfers: Minimal Assistance - Patient > 75% Transfer (Assistive device): Other (Comment) (slideboard) Locomotion  Gait Ambulation: No Gait Gait: No Stairs / Additional Locomotion Stairs: No Wheelchair Mobility Wheelchair Mobility: No   Discharge Criteria:  Patient will be discharged from PT if patient refuses treatment 3 consecutive times without medical reason, if treatment goals not met, if there is a change in medical status, if patient makes no progress towards goals or if patient is discharged from hospital.  The above assessment, treatment plan, treatment alternatives and goals were discussed and mutually agreed upon: by patient  Huntley Dec PT, DPT 11/30/2022, 12:16 PM

## 2022-11-30 NOTE — Progress Notes (Signed)
Inpatient Rehabilitation  Patient information reviewed and entered into eRehab system by Kelly Gentry, OTR/L, Rehab Quality Coordinator.   Information including medical coding, functional ability and quality indicators will be reviewed and updated through discharge.   

## 2022-11-30 NOTE — Evaluation (Signed)
Occupational Therapy Assessment and Plan  Patient Details  Name: ARRON ZAPATA MRN: [id number] Date of Birth: 1970/04/28  OT Diagnosis: abnormal posture, acute pain, blindness and low vision, muscle weakness (generalized), and swelling of limb Rehab Potential: Rehab Potential (ACUTE ONLY): Fair ELOS: 7-10 days   Today's Date: 11/30/2022 OT Individual Time: 1035-1130 OT Individual Time Calculation (min): 55 min     Hospital Problem: Principal Problem:   S/P AKA (above knee amputation), left (HCC) Active Problems:   ESRD on dialysis (HCC)   Protein-calorie malnutrition, severe   Debility   Past Medical History:  Past Medical History:  Diagnosis Date   Arthritis    hands and shoulders   Blindness and low vision    "Stargardt disease"   Dissection of aorta (HCC) 2004   a. s/p extensive repeair in 2004 in [place] complicated by ESRD, lower extremity paralysis, coma, and extended hospitalization of 2 years   ESRD (end stage renal disease) (HCC)    a. TTS   Headache    History of cardioversion 2014   Hypertension    Neuropathy    Non-healing non-surgical wound 03/2016   PAF (paroxysmal atrial fibrillation) (HCC)    a. s/p DCCV in 2014; b. on Coumadin; c. CHADS2VASc => 2 (HTN, vascular disease)   Paralysis (HCC)    due to dissection of aorta in 2004, lower extremities   Pneumonia    Past Surgical History:  Past Surgical History:  Procedure Laterality Date   AMPUTATION Left 11/17/2022   Procedure: AMPUTATION ABOVE KNEE;  Surgeon: Nada Libman, MD;  Location: [facility] OR;  Service: Vascular;  Laterality: Left;   APPLICATION OF WOUND VAC Left 04/13/2016   Procedure: APPLICATION OF WOUND VAC;  Surgeon: Fransisco Hertz, MD;  Location: [facility] OR;  Service: Vascular;  Laterality: Left;   APPLICATION OF WOUND VAC Left 04/18/2016   Procedure: APPLICATION OF WOUND VAC;  Surgeon: Maeola Harman, MD;  Location: [facility] OR;  Service: Vascular;  Laterality: Left;  Wound vac change    APPLICATION OF  WOUND VAC Left 04/20/2016   Procedure: WOUND VAC CHANGE;  Surgeon: Fransisco Hertz, MD;  Location: [facility] OR;  Service: Vascular;  Laterality: Left;   AV FISTULA PLACEMENT     BASCILIC VEIN TRANSPOSITION Left 12/09/2015   Procedure: FIRST STAGE BASILIC VEIN TRANSPOSITION LEFT UPPER ARM;  Surgeon: Fransisco Hertz, MD;  Location: [facility] OR;  Service: Vascular;  Laterality: Left;   BASCILIC VEIN TRANSPOSITION Left 03/09/2016   Procedure: SECOND STAGE BASILIC VEIN TRANSPOSITION WITH REVISION OF ANASTOMOSIS LEFT UPPER ARM;  Surgeon: Fransisco Hertz, MD;  Location: [facility] OR;  Service: Vascular;  Laterality: Left;   CARDIOVERSION     CARDIOVERSION N/A 01/04/2019   Procedure: CARDIOVERSION;  Surgeon: Lewayne Bunting, MD;  Location: [facility] ENDOSCOPY;  Service: Cardiovascular;  Laterality: N/A;   CHEST TUBE INSERTION Left 11/02/2022   Procedure: CHEST TUBE INSERTION;  Surgeon: Lynnell Catalan, MD;  Location: [facility] ENDOSCOPY;  Service: Pulmonary;  Laterality: Left;  Please have both Pigtail (COOK) and chest tube tray available   REPAIR OF ACUTE ASCENDING THORACIC AORTIC DISSECTION     REVISON OF ARTERIOVENOUS FISTULA Left 04/20/2016   Procedure: LIGATION OF BASILIC VEIN TRANSPOSITION;  Surgeon: Fransisco Hertz, MD;  Location: [facility] OR;  Service: Vascular;  Laterality: Left;   WOUND DEBRIDEMENT Left 04/13/2016   Procedure: DEBRIDEMENT WOUND;  Surgeon: Fransisco Hertz, MD;  Location: [facility] OR;  Service: Vascular;  Laterality: Left;    Assessment &  Plan Clinical Impression:  NICOL OHLMANN is a 52 year old male with history of ESRD-HD TTS, calciphylaxis, stargardt disease with low vision, PAF-chronic coumadin, aortic dissection s/p repair, PAD, neuropathy who missed HD sessions X 2 due to nausea,vomiting and diarrhea. He presented to ED on 10/27/22 with hypoxic respiratory failure, hyperkalemia and complex moderate to large left pleural effusion. He underwent emergent HD.  PCCM recommended chest tube once INR trended down. CT abdomen pelvis done due to  abdominal pain and showed stable type B aortic dissection with stable penetrating ulcer. Patient had declined surgery in the past. Dr. Edilia Bo consulted for input on aneurysm as well as BLE with chronic venous stasis changes and evidence of PAD with diminished pulse right femoral artery. As patient non-ambulatory without symptoms, ABI ordered  revealing non compressible veins. He declined recommendations of BLE amputation and Eucerin cream recommended with follow up in a year for CTA abdomen. Left clavicular SQ mass stable since 84132 and felt to be possible old hematoma.     Vitamin K administered 7/30 due to slow drop in INR, CT placed on 07/31 by Dr. Denese Killings. Fluid felt to be parapneumonic with negative cultures and PNA treated with 10 day course of IV rocephin/5 days of Zithromax and transitioned to Augmentin thru 08/14.  Follow up CT chest 08/03 showed mild increase in left pleural effusion with no change in near complete collapse of LLL  and required intrapleural fibrinolysis X 3 doses. He has had issues with hypotension requiring midodrine and additional dose of IV albumin.  Once drainage decreased with decrease in size of left effusion, CT removed on 08/06. SOB improving and therapy was working with patient who has been limited by weakness and left flank pain. He developed new left heel wound and was agreeable to L-AKA by Dr. Myra Gianotti on 08/15 and felt to be high risk for future R-AKA.  Post op transitioned to Eliquis, oxygen being weaned off, post op pain slowly improving, hypotension continues to be an issue but orthostasis improving. Due to ongoing issues with hypoxia,  CXR ordered 08/26 showed diffuse pulmonary edema. He underwent HD today with IV albumin to assist with 2 kg goal. Therapy has been working with patient who is significantly debilitated due to medical issues and is working on sitting tolerance. CIR recommended due to functional decline. Patient transferred to CIR on 11/29/2022 .     Patient currently requires mod with basic self-care skills secondary to muscle weakness, decreased cardiorespiratoy endurance and decreased oxygen support, decreased coordination, decreased visual acuity, and decreased sitting balance.  Prior to hospitalization, patient could complete ADLs with mod I.  Patient will benefit from skilled intervention to decrease level of assist with basic self-care skills and increase independence with basic self-care skills prior to discharge home with care partner.  Anticipate patient will require 24 hour supervision and follow up home health.  OT - End of Session Activity Tolerance: Tolerates < 10 min activity, no significant change in vital signs Endurance Deficit: Yes Endurance Deficit Description: frequent rest breaks needed OT Assessment Rehab Potential (ACUTE ONLY): Fair OT Barriers to Discharge: Home environment access/layout;Decreased caregiver support;Wound Care;Hemodialysis;Weight bearing restrictions;New oxygen OT Patient demonstrates impairments in the following area(s): Balance;Edema;Endurance;Motor;Safety;Pain OT Basic ADL's Functional Problem(s): Bathing;Dressing;Toileting OT Transfers Functional Problem(s): Toilet;Tub/Shower OT Additional Impairment(s): None OT Plan OT Intensity: Minimum of 1-2 x/day, 45 to 90 minutes OT Frequency: 5 out of 7 days OT Duration/Estimated Length of Stay: 7-10 days OT Treatment/Interventions: Balance/vestibular training;Discharge planning;Pain management;Self Care/advanced ADL  retraining;Therapeutic Activities;UE/LE Coordination activities;Disease mangement/prevention;Functional mobility training;Patient/family education;Skin care/wound managment;Therapeutic Exercise;DME/adaptive equipment instruction;Neuromuscular re-education;UE/LE Strength taining/ROM;Wheelchair propulsion/positioning;Psychosocial support OT Self Feeding Anticipated Outcome(s): Set up A OT Basic Self-Care Anticipated Outcome(s):  Supervision/mod I OT Toileting Anticipated Outcome(s): Supervision OT Bathroom Transfers Anticipated Outcome(s): Supervision OT Recommendation Recommendations for Other Services: Neuropsych consult;Therapeutic Recreation consult Therapeutic Recreation Interventions: Pet therapy Patient destination: Home Follow Up Recommendations: 24 hour supervision/assistance;Home health OT Equipment Recommended: To be determined   OT Evaluation Precautions/Restrictions  Precautions Precautions: Fall;Other (comment) Precaution Comments: watch BP; blind in L eye Restrictions Weight Bearing Restrictions: Yes LLE Weight Bearing: Non weight bearing Home Living/Prior Functioning Home Living Family/patient expects to be discharged to:: Private residence Living Arrangements: Other relatives (brother) Available Help at Discharge: Family, Available 24 hours/day (bother is limited phyiscally (had multiple strokes and doesn't ambulate well)) Type of Home: House Home Access: Ramped entrance Home Layout: One level Bathroom Shower/Tub: Tub/shower unit, Sponge bathes at baseline [employer]: Standard (has push up rails and built up seat) Bathroom Accessibility: Yes Additional Comments: pt has WC that he got ~1 year ago, rollator, and crutches at home. Pt reports he has not been ambulatory for year prior to amputation due to "blowing his knee out"  Lives With: Family (brother) IADL History Homemaking Responsibilities: Yes Meal Prep Responsibility: Primary Homemaking Comments: reports he primarly did meals, and would like to do at home but anticipate he will need assist for it Occupation: On disability Leisure and Hobbies: listen to music- old [employer] Prior Function Level of Independence: Requires assistive device for independence  Able to Take Stairs?: No Driving: No Vision Baseline Vision/History: 2 Legally blind;6 Macular Degeneration;1 Wears glasses (wears glasses for comfort only) Ability to See in  Adequate Light: 4 Severely impaired Patient Visual Report: Other (comment) (reports that the ability to see "shapes" are less clear and more fuzzy but has had severely impaired vision at baseline since 2016) Perception  Perception: Impaired Perception-Other Comments: blindness limiting perception; follows voice but often over reaches Praxis Praxis: [facility] Cognition Cognition Overall Cognitive Status: Within Functional Limits for tasks assessed Arousal/Alertness: Awake/alert Orientation Level: Person;Place;Situation Person: Oriented Place: Oriented Situation: Oriented Memory: Appears intact Awareness: Appears intact Problem Solving: Appears intact Safety/Judgment: Appears intact Brief Interview for Mental Status (BIMS) Repetition of Three Words (First Attempt): 3 Temporal Orientation: Year: Correct Temporal Orientation: Month: Missed by 6 days to 1 month Temporal Orientation: Day: Correct Recall: "Sock": Yes, no cue required Recall: "Blue": Yes, no cue required Recall: "Bed": Yes, no cue required BIMS Summary Score: 14 Sensation Sensation Light Touch: Impaired Detail Light Touch Impaired Details: Impaired RLE;Impaired LLE Hot/Cold: Not tested Proprioception: Impaired by gross assessment Stereognosis: Not tested Additional Comments: Pt reports phantom pain, severe BLE neuropathy described as legs being "on fire" and "explosions of pain going from legs up to lower back". Impaired sensation LLE>RLE. Coordination Gross Motor Movements are Fluid and Coordinated: No Fine Motor Movements are Fluid and Coordinated: No Finger Nose Finger Test: impaired bilaterally due to blindness Heel Shin Test: unable to perform on LLE due to AKA Motor  Motor Motor: Abnormal postural alignment and control Motor - Skilled Clinical Observations: altered balance strategies from L AKA, pain, and blind in L eye  Trunk/Postural Assessment  Cervical Assessment Cervical Assessment: Exceptions to [facility]  (forward head) Thoracic Assessment Thoracic Assessment: Exceptions to [facility] (thoracic rounding) Lumbar Assessment Lumbar Assessment: Exceptions to [facility] (posterior pelvic tilt) Postural Control Postural Control: Deficits on evaluation Protective Responses: delayed on L  Balance Balance  Balance Assessed: Yes Static Sitting Balance Static Sitting - Balance Support: Feet supported;Bilateral upper extremity supported Static Sitting - Level of Assistance: 6: Modified independent (Device/Increase time) Dynamic Sitting Balance Dynamic Sitting - Balance Support: Feet supported;No upper extremity supported Dynamic Sitting - Level of Assistance: 5: Stand by assistance (supervision) Extremity/Trunk Assessment RUE Assessment RUE Assessment: Within Functional Limits LUE Assessment LUE Assessment: Within Functional Limits  Care Tool Care Tool Self Care Eating   Eating Assist Level: Set up assist    Oral Care    Oral Care Assist Level: Set up assist    Bathing   Body parts bathed by patient: Right arm;Left arm;Chest;Abdomen;Front perineal area;Buttocks;Right upper leg;Left upper leg;Face;Right lower leg   Body parts n/a: Left lower leg Assist Level: Minimal Assistance - Patient > 75%    Upper Body Dressing(including orthotics)   What is the patient wearing?: Pull over shirt   Assist Level: Minimal Assistance - Patient > 75%    Lower Body Dressing (excluding footwear)   What is the patient wearing?: Incontinence brief;Pants Assist for lower body dressing: Moderate Assistance - Patient 50 - 74%    Putting on/Taking off footwear   What is the patient wearing?: Non-skid slipper socks Assist for footwear: Dependent - Patient 0%       Care Tool Toileting Toileting activity   Assist for toileting: Moderate Assistance - Patient 50 - 74%     Care Tool Bed Mobility Roll left and right activity   Roll left and right assist level: Supervision/Verbal cueing    Sit to lying activity         Lying to sitting on side of bed activity   Lying to sitting on side of bed assist level: the ability to move from lying on the back to sitting on the side of the bed with no back support.: Minimal Assistance - Patient > 75%     Care Tool Transfers Sit to stand transfer Sit to stand activity did not occur: Safety/medical concerns (pain, fear of falling)      Chair/bed transfer   Chair/bed transfer assist level: Minimal Assistance - Patient > 75% (slideboard)     Toilet transfer   Assist Level: Minimal Assistance - Patient > 75% (slideboard)     Care Tool Cognition  Expression of Ideas and Wants Expression of Ideas and Wants: 3. Some difficulty - exhibits some difficulty with expressing needs and ideas (e.g, some words or finishing thoughts) or speech is not clear  Understanding Verbal and Non-Verbal Content Understanding Verbal and Non-Verbal Content: 4. Understands (complex and basic) - clear comprehension without cues or repetitions   Memory/Recall Ability     Refer to Care Plan for Long Term Goals  SHORT TERM GOAL WEEK 1 OT Short Term Goal 1 (Week 1): Pt will complete LB dressing with CGA OT Short Term Goal 2 (Week 1): Pt will report < 5/10 pain for 2 consecutive sessions OT Short Term Goal 3 (Week 1): Pt will complete toileting with CGA  Recommendations for other services: Neuropsych and Therapeutic Recreation  Pet therapy   Skilled Therapeutic Intervention Patient received seated in w/c upon therapy arrival and agreeable to participate in OT evaluation. Education provided on OT purpose, therapy schedule, goals for therapy, and safety policy while in rehab. 9/10 pain reported incisional region on Rt limb; declined pain meds; OT offered repositioning and rest breaks for pain relief.  Patient demonstrates global endurance, standing balance, GMC/FMC, vision and strength deficits resulting in difficulty completing BADL tasks  without increased physical assist. Pt will benefit from  skilled OT services to focus on mentioned deficits. Pt continent of soft BM; charted.  Cues needed throughout session for safety, hand positioning and SB technique. Education provided on pain management, however pt more concerned about be constipated. MD notified of pt request to trial meds with natural supplement if able. See below for ADL and functional transfer performance. Pt remained in Rt side lying in bed with pillows in between legs, and under buttocks to off load sacral pain, at conclusion of session with bed alarm on and all needs met at end of session.    ADL ADL Eating: Set up Where Assessed-Eating: Wheelchair Grooming: Supervision/safety Where Assessed-Grooming: Wheelchair Upper Body Bathing: Supervision/safety (simulated) Where Assessed-Upper Body Bathing: Wheelchair Lower Body Bathing: Moderate assistance (simulated) Where Assessed-Lower Body Bathing: Wheelchair Upper Body Dressing: Minimal assistance Where Assessed-Upper Body Dressing: Other (Comment) (BSC) Lower Body Dressing: Moderate assistance Where Assessed-Lower Body Dressing: Other (Comment) (BSC) Toileting: Moderate assistance Where Assessed-Toileting: Bedside Commode Toilet Transfer: Minimal assistance Toilet Transfer Method: Scientist, research (life sciences): Drop arm bedside commode Tub/Shower Transfer: Unable to assess Tub/Shower Transfer Method: Unable to assess Film/video editor: Unable to assess Film/video editor Method: Unable to assess Mobility  Bed Mobility Bed Mobility: Rolling Left;Supine to Sit Rolling Left: Supervision/Verbal cueing Supine to Sit: Minimal Assistance - Patient > 75%   Discharge Criteria: Patient will be discharged from OT if patient refuses treatment 3 consecutive times without medical reason, if treatment goals not met, if there is a change in medical status, if patient makes no progress towards goals or if patient is discharged from hospital.  The above  assessment, treatment plan, treatment alternatives and goals were discussed and mutually agreed upon: by patient  Melvyn Novas, MS, OTR/L  11/30/2022, 12:09 PM

## 2022-11-30 NOTE — Progress Notes (Addendum)
PROGRESS NOTE   Subjective/Complaints: C/o pain in penis that started yesterday and is still present when he moves. Says he has not had sensation there for many years. Does not want UA/UC since he does not want to be catheterized  ROS: +penile pain   Objective:   No results found. Recent Labs    11/28/22 0900 11/29/22 0352  WBC 10.9* 10.3  HGB 8.4* 8.1*  HCT 30.0* 28.2*  PLT 274 273   Recent Labs    11/28/22 0900 11/29/22 0352  NA 138 137  K 3.6 4.3  CL 99 102  CO2 25 23  GLUCOSE 100* 92  BUN 30* 39*  CREATININE 5.19* 5.76*  CALCIUM 6.6* 6.6*    Intake/Output Summary (Last 24 hours) at 11/30/2022 0915 Last data filed at 11/30/2022 0754 Gross per 24 hour  Intake 118 ml  Output --  Net 118 ml        Physical Exam: Vital Signs Blood pressure 109/70, pulse 68, temperature 98.9 F (37.2 C), temperature source Oral, resp. rate 16, weight 58.4 kg, SpO2 98%. Gen: no distress, normal appearing, BMI is 16.09 Eyes:     General:        Right eye: No discharge.        Left eye: No discharge.     Comments: Injected. Does not make eye contact but looks in general direction.  Says can see shapes, but nothing clear  Cardiovascular:     Rate and Rhythm: Normal rate and regular rhythm.     Heart sounds: Normal heart sounds. No murmur heard.    No gallop.  Pulmonary:     Effort: Pulmonary effort is normal. No respiratory distress.     Breath sounds: No wheezing, rhonchi or rales.     Comments: Decreased at bases R>L even though chest tube was on L side Abdominal:     General: Abdomen is flat. There is no distension.     Palpations: Abdomen is soft.     Tenderness: There is no abdominal tenderness.  Musculoskeletal:        General: No swelling or tenderness.     Cervical back: Neck supple. No tenderness.     Comments: Amputation site with staples in place and pillow under AKA with multiple dried areas of serous  drainage. Right foot with overgrown toe nails.   Ues 5-/5 B/L RLE- HF 4-/5; otherwise DF 0/5 and PF and KE 2-/5 LLE- L AKA- with HF 4-/5   Skin:    General: Skin is warm and dry.     Comments: Boggy area infra clavicular area--non tender and reported to be stable. Multiple grafts RUE--non functional. R-internal jugular noted. RLE with areas of discoloration and stasis changes.  R HD catheter looks OK 2 IV"s LUE- look OK L aka has staples intact- dog ears notable, not shaping so far Not significant edema, however Leg/ L AKA- tip elevated on pillow- said per Surgeon  Neurological:     Mental Status: He is alert and oriented to person, place, and time.     Comments: Decreased to absent below R knee  Psychiatric:        Mood and Affect:  Mood normal.        Behavior: Behavior normal.   Assessment/Plan: 1. Functional deficits which require 3+ hours per day of interdisciplinary therapy in a comprehensive inpatient rehab setting. Physiatrist is providing close team supervision and 24 hour management of active medical problems listed below. Physiatrist and rehab team continue to assess barriers to discharge/monitor patient progress toward functional and medical goals  Care Tool:  Bathing              Bathing assist       Upper Body Dressing/Undressing Upper body dressing        Upper body assist      Lower Body Dressing/Undressing Lower body dressing            Lower body assist       Toileting Toileting    Toileting assist       Transfers Chair/bed transfer  Transfers assist           Locomotion Ambulation   Ambulation assist              Walk 10 feet activity   Assist           Walk 50 feet activity   Assist           Walk 150 feet activity   Assist           Walk 10 feet on uneven surface  activity   Assist           Wheelchair     Assist               Wheelchair 50 feet with 2 turns  activity    Assist            Wheelchair 150 feet activity     Assist          Blood pressure 109/70, pulse 68, temperature 98.9 F (37.2 C), temperature source Oral, resp. rate 16, weight 58.4 kg, SpO2 98%.  Medical Problem List and Plan: 1. Functional deficits secondary to  L AKA secondary to left foot wound 2/2 PAD             -patient may not shower due to R HD catheter             -ELOS/Goals: 12-14 days supervision Admit to CIR- suggest mirror therapy for phantom pain control- also likely needs shrinkers x2 2.  Antithrombotics: -DVT/anticoagulation:  Pharmaceutical: Eliquis. IVC filter in place.              -antiplatelet therapy: N/A 3. Penile pain: topical bacitracin ordered 4. Insomnia: add amitriptyline 10mg  HS 5. Neuropsych/cognition: This patient is capable of making decisions on his own behalf. 6. Skin/Wound Care: Routine pressure relief measure 7. Fluids/Electrolytes/Nutrition:  Strict I/O. Daily weights. Diet changed to Renal per input from nephrology. 8. L-AKA: Monitor incision for healing. Dry dressing.  9. ESRD: Renal diet with 1200 cc FR/HD TTS at the end of the day to help with tolerance of therapy.  10. H/o AAA aneurysm rupture s/p repair:  with resultant paraplegia, sensory deficits from saddle down and cauda equina syndrome             --was able to walk till a few years ago when left knee gave out.              --has severe LLQ pain prior to defecation. Continue Senna/miralax--discussed suppository every other day but patient wants to think on it "that's too  much" 11. Anemia of chronic disease:] 12. L- PNA and parapneumonic effusion s/p CT: Has completed 2 week course of antibiotics.  13. Fluid overload: Continue to monitor for symptoms and wean oxygen as tolerated.  14. Neurogenic bowel: Will monitor for now.   15. Neuropathic pain- will change gabapentin to 300 mg at bedtime, add amitriptyline 10mg  HS  16. Leukocytosis: WBC reviewed and has  normalized        LOS: 1 days A FACE TO FACE EVALUATION WAS PERFORMED  Madisan Bice P Jisele Price 11/30/2022, 9:15 AM

## 2022-11-30 NOTE — Patient Care Conference (Signed)
Inpatient RehabilitationTeam Conference and Plan of Care Update Date: 11/30/2022   Time: 11:44 AM    Patient Name: Corey [name]      Medical Record Number: [id number]  Date of Birth: [birth year] Sex: Male         Room/Bed: 4W12C/4W12C-02 Payor Info: Payor: [employer]vertising copy[employer] MEDICARE / Plan: Suan Halter DUAL COMPLETE / Product Type: *No Product type* /    Admit Date/Time:  11/29/2022  4:01 PM  Primary Diagnosis:  S/P AKA (above knee amputation), left [facility])  Hospital Problems: Principal Problem:   S/P AKA (above knee amputation), left (HCC) Active Problems:   ESRD on dialysis (HCC)   Protein-calorie malnutrition, severe   Debility    Expected Discharge Date: Expected Discharge Date: 12/11/22  Team Members Present: Physician leading conference: Dr. Sula Soda Social Worker Present: Dossie Der, LCSW Nurse Present: Chana Bode, RN PT Present: Raechel Chute, PT OT Present: Candee Furbish, OT     Current Status/Progress Goal Weekly Team Focus  Bowel/Bladder   Pt is continent of Bowel. Anuric d/t being a dialysis pt. LBM: 08/27   Remain continent of bowel.   Assist w/ toileting needs q shift & PRN.    Swallow/Nutrition/ Hydration               ADL's   Min A UB, Mod A LB, Mod A toileting   Supervision/mod I   Barriers- endurance, low vision, pain management    Mobility   bed mobility min A, slideboard transfers min A  supervision/mod I  barriers: pain, complex medical hx, global weakness/deconditioning, contralateral limb not in good condition    Communication                Safety/Cognition/ Behavioral Observations               Pain   Rates surgical/phantom pain on L leg as a 8/10.   Will rate pain < 4/10.   Assess pain q shift & PRN.    Skin   Incision to L leg s/p AKA - staples (OTA). R leg - dry and flaky   Incisional area will heal in a timely manner w/o complications.  Assess skin q shift & PRN.      Discharge Planning:  New  evaluation today will need to confirm plan for discharge   Team Discussion: Patient post AKA on ESRD. Reported pain; ulcer noted, WBC WNL per MD.  Limited by pain, phantom pain and fear of falling.  Patient on target to meet rehab goals: Evals pending; needs min assist to use a slide-board for transfers. Has been non-ambulatory for years. Needs min assist for upper body care and mod assist for lower body care. Goals for discharge set for supervision - Mod I overall.  *See Care Plan and progress notes for long and short-term goals.   Revisions to Treatment Plan:  Custom wheelchair consult   Teaching Needs: Safety, medications, limb care, skin care, dietary modifications, transfers, toileting, etc.  Current Barriers to Discharge: Decreased caregiver support  Possible Resolutions to Barriers: Family education     Medical Summary Current Status: tachypnea, hypotension, underweight, penile pain, phantom limb pain, constipation  Barriers to Discharge: Medical stability  Barriers to Discharge Comments: tachypnea, hypotension, underweight, penile pain, phantom limb pain, constipation Possible Resolutions to [employer] Focus: continue to monitor respiratory rate, continue to monitor blood pressure, continue to monitor weight, bacitracin ordered, amitriptyline ordered, continue bowel program   Continued Need for Acute Rehabilitation Level of Care: The patient  requires daily medical management by a physician with specialized training in physical medicine and rehabilitation for the following reasons: Direction of a multidisciplinary physical rehabilitation program to maximize functional independence : Yes Medical management of patient stability for increased activity during participation in an intensive rehabilitation regime.: Yes Analysis of laboratory values and/or radiology reports with any subsequent need for medication adjustment and/or medical intervention. : Yes   I attest that I  was present, lead the team conference, and concur with the assessment and plan of the team.   Chana Bode B 11/30/2022, 3:13 PM

## 2022-11-30 NOTE — Discharge Instructions (Addendum)
Inpatient Rehab Discharge Instructions  Corey [name] Discharge date and time:  12/14/22  Activities/Precautions/ Functional Status: Activity: no lifting, driving, or strenuous exercise till cleared by MD Diet: renal diet Keep fluids limited to 5 cups a day.  Wound Care: keep wound clean and dry. Contact Dr. Myra Gianotti if you develop any problems with your incision/wound--redness, swelling, increase in pain, drainage or if you develop fever or chills.    Functional status:  ___ No restrictions      ___ Walk up steps independently ___ 24/7 supervision/assistance   ___ Walk up steps with assistance ___ Intermittent supervision/assistance  ___ Bathe/dress independently ___ Walk with walker     _X__ Bathe/dress with assistance ___ Walk Independently     ___ Shower independently ___ Walk with assistance     ___ Shower with assistance _X__ No alcohol      ___ Return to work/school ________  COMMUNITY REFERRALS UPON DISCHARGE:    Home Health:   PT     OT     ST                  Agency: [facility] Phone: [phone]    Medical Equipment/Items Ordered:Home 02 Set up, Portable Concentrator, ELR, Hospital Bed, Drop Arm Bedside Commode, Transfer Board                                                 Agency/Supplier: [employer] [phone]   Special Instructions:    My questions have been answered and I understand these instructions. I will adhere to these goals and the provided educational materials after my discharge from the hospital.  Patient/Caregiver Signature _______________________________ Date __________  Clinician Signature _______________________________________ Date __________  Please bring this form and your medication list with you to all your follow-up doctor's appointments.   Information on my medicine - ELIQUIS (apixaban)  This medication education was reviewed with me or my healthcare representative as part of my discharge preparation.  The pharmacist that spoke with me during  my hospital stay was:  Leander Rams, [facility]  Why was Eliquis prescribed for you? Eliquis was prescribed for you to reduce the risk of a blood clot forming that can cause a stroke if you have a medical condition called atrial fibrillation (a type of irregular heartbeat).  What do You need to know about Eliquis ? Take your Eliquis TWICE DAILY - one tablet in the morning and one tablet in the evening with or without food. If you have difficulty swallowing the tablet whole please discuss with your pharmacist how to take the medication safely.  Take Eliquis exactly as prescribed by your doctor and DO NOT stop taking Eliquis without talking to the doctor who prescribed the medication.  Stopping may increase your risk of developing a stroke.  Refill your prescription before you run out.  After discharge, you should have regular check-up appointments with your healthcare provider that is prescribing your Eliquis.  In the future your dose may need to be changed if your kidney function or weight changes by a significant amount or as you get older.  What do you do if you miss a dose? If you miss a dose, take it as soon as you remember on the same day and resume taking twice daily.  Do not take more than one dose of ELIQUIS at the same time  to make up a missed dose.  Important Safety Information A possible side effect of Eliquis is bleeding. You should call your healthcare provider right away if you experience any of the following: Bleeding from an injury or your nose that does not stop. Unusual colored urine (red or dark brown) or unusual colored stools (red or black). Unusual bruising for unknown reasons. A serious fall or if you hit your head (even if there is no bleeding).  Some medicines may interact with Eliquis and might increase your risk of bleeding or clotting while on Eliquis. To help avoid this, consult your healthcare provider or pharmacist prior to using any new prescription or  non-prescription medications, including herbals, vitamins, non-steroidal anti-inflammatory drugs (NSAIDs) and supplements.  This website has more information on Eliquis (apixaban): http://www.eliquis.com/eliquis/home  =============================================== Atrial Fibrillation    Atrial fibrillation is a type of heartbeat that is irregular or fast. If you have this condition, your heart beats without any order. This makes it hard for your heart to pump blood in a normal way. Atrial fibrillation may come and go, or it may become a long-lasting problem. If this condition is not treated, it can put you at higher risk for stroke, heart failure, and other heart problems.  What are the causes? This condition may be caused by diseases that damage the heart. They include: High blood pressure. Heart failure. Heart valve disease. Heart surgery. Other causes include: Diabetes. Thyroid disease. Being overweight. Kidney disease. Sometimes the cause is not known.  What increases the risk? You are more likely to develop this condition if: You are older. You smoke. You exercise often and very hard. You have a family history of this condition. You are a man. You use drugs. You drink a lot of alcohol. You have lung conditions, such as emphysema, pneumonia, or COPD. You have sleep apnea.  What are the signs or symptoms? Common symptoms of this condition include: A feeling that your heart is beating very fast. Chest pain or discomfort. Feeling short of breath. Suddenly feeling light-headed or weak. Getting tired easily during activity. Fainting. Sweating. In some cases, there are no symptoms.  How is this treated? Treatment for this condition depends on underlying conditions and how you feel when you have atrial fibrillation. They include: Medicines to: Prevent blood clots. Treat heart rate or heart rhythm problems. Using devices, such as a pacemaker, to correct heart rhythm  problems. Doing surgery to remove the part of the heart that sends bad signals. Closing an area where clots can form in the heart (left atrial appendage). In some cases, your doctor will treat other underlying conditions.  Follow these instructions at home:  Medicines Take over-the-counter and prescription medicines only as told by your doctor. Do not take any new medicines without first talking to your doctor. If you are taking blood thinners: Talk with your doctor before you take any medicines that have aspirin or NSAIDs, such as ibuprofen, in them. Take your medicine exactly as told by your doctor. Take it at the same time each day. Avoid activities that could hurt or bruise you. Follow instructions about how to prevent falls. Wear a bracelet that says you are taking blood thinners. Or, carry a card that lists what medicines you take. Lifestyle         Do not use any products that have nicotine or tobacco in them. These include cigarettes, e-cigarettes, and chewing tobacco. If you need help quitting, ask your doctor. Eat heart-healthy foods. Talk with  your doctor about the right eating plan for you. Exercise regularly as told by your doctor. Do not drink alcohol. Lose weight if you are overweight. Do not use drugs, including cannabis.  General instructions If you have a condition that causes breathing to stop for a short period of time (apnea), treat it as told by your doctor. Keep a healthy weight. Do not use diet pills unless your doctor says they are safe for you. Diet pills may make heart problems worse. Keep all follow-up visits as told by your doctor. This is important.  Contact a doctor if: You notice a change in the speed, rhythm, or strength of your heartbeat. You are taking a blood-thinning medicine and you get more bruising. You get tired more easily when you move or exercise. You have a sudden change in weight.  Get help right away if:    You have pain in your  chest or your belly (abdomen). You have trouble breathing. You have side effects of blood thinners, such as blood in your vomit, poop (stool), or pee (urine), or bleeding that cannot stop. You have any signs of a stroke. "BE FAST" is an easy way to remember the main warning signs: B - Balance. Signs are dizziness, sudden trouble walking, or loss of balance. E - Eyes. Signs are trouble seeing or a change in how you see. F - Face. Signs are sudden weakness or loss of feeling in the face, or the face or eyelid drooping on one side. A - Arms. Signs are weakness or loss of feeling in an arm. This happens suddenly and usually on one side of the body. S - Speech. Signs are sudden trouble speaking, slurred speech, or trouble understanding what people say. T - Time. Time to call emergency services. Write down what time symptoms started. You have other signs of a stroke, such as: A sudden, very bad headache with no known cause. Feeling like you may vomit (nausea). Vomiting. A seizure.  These symptoms may be an emergency. Do not wait to see if the symptoms will go away. Get medical help right away. Call your local emergency services (911 in the [place] Do not drive yourself to the hospital. Summary Atrial fibrillation is a type of heartbeat that is irregular or fast. You are at higher risk of this condition if you smoke, are older, have diabetes, or are overweight. Follow your doctor's instructions about medicines, diet, exercise, and follow-up visits. Get help right away if you have signs or symptoms of a stroke. Get help right away if you cannot catch your breath, or you have chest pain or discomfort. This information is not intended to replace advice given to you by your health care provider. Make sure you discuss any questions you have with your health care provider. Document Revised: 09/12/2018 Document Reviewed: 09/12/2018 Elsevier Patient Education  2020 [employer].   Information on my  medicine - ELIQUIS (apixaban)   Why was Eliquis prescribed for you? Eliquis was prescribed for you to reduce the risk of a blood clot forming that can cause a stroke if you have a medical condition called atrial fibrillation (a type of irregular heartbeat).  What do You need to know about Eliquis ? Take your Eliquis TWICE DAILY - one tablet in the morning and one tablet in the evening with or without food. If you have difficulty swallowing the tablet whole please discuss with your pharmacist how to take the medication safely.  Take Eliquis exactly as prescribed by  your doctor and DO NOT stop taking Eliquis without talking to the doctor who prescribed the medication.  Stopping may increase your risk of developing a stroke.  Refill your prescription before you run out.  After discharge, you should have regular check-up appointments with your healthcare provider that is prescribing your Eliquis.  In the future your dose may need to be changed if your kidney function or weight changes by a significant amount or as you get older.  What do you do if you miss a dose? If you miss a dose, take it as soon as you remember on the same day and resume taking twice daily.  Do not take more than one dose of ELIQUIS at the same time to make up a missed dose.  Important Safety Information A possible side effect of Eliquis is bleeding. You should call your healthcare provider right away if you experience any of the following: Bleeding from an injury or your nose that does not stop. Unusual colored urine (red or dark brown) or unusual colored stools (red or black). Unusual bruising for unknown reasons. A serious fall or if you hit your head (even if there is no bleeding).  Some medicines may interact with Eliquis and might increase your risk of bleeding or clotting while on Eliquis. To help avoid this, consult your healthcare provider or pharmacist prior to using any new prescription or  non-prescription medications, including herbals, vitamins, non-steroidal anti-inflammatory drugs (NSAIDs) and supplements.  This website has more information on Eliquis (apixaban): http://www.eliquis.com/eliquis/home

## 2022-11-30 NOTE — Progress Notes (Addendum)
Initial Nutrition Assessment  DOCUMENTATION CODES:   Underweight  INTERVENTION:  - Continue Reg diet, 1200 mL fluid.   - Continue Ensure Enlive po BID, each supplement provides 350 kcal and 20 grams of protein.  NUTRITION DIAGNOSIS:   Increased nutrient needs related to chronic illness as evidenced by estimated needs.  GOAL:   Patient will meet greater than or equal to 90% of their needs  MONITOR:   PO intake, Supplement acceptance  REASON FOR ASSESSMENT:   Malnutrition Screening Tool    ASSESSMENT:   52 y.o. male admits to CIR related to functional deficits secondary to L AKA. PMH includes: ESRD on HD, neuropathy, PAF, paralysis.  Meds reviewed: colace, rena-vit, senokot, miralax, renvela. Labs reviewed: BUN/Creatinine elevated.   RD attempted to see pt in-person yesterday but pt was in HD. Pt ate 100% of his breakfast this am. Per record, pt has eaten mostly 100% of his meals since admission. Pt with very low BMI.  Pt is likely meeting his needs at this time. Pt has Ensure BID ordered. RD will continue to closely monitor PO intakes.   NUTRITION - FOCUSED PHYSICAL EXAM:  Remote assessment.  Diet Order:   Diet Order             Diet regular Room service appropriate? Yes; Fluid consistency: Thin; Fluid restriction: 1200 mL Fluid  Diet effective now                   EDUCATION NEEDS:   Not appropriate for education at this time  Skin:  Skin Assessment: Skin Integrity Issues: Skin Integrity Issues:: Incisions Incisions: L chest; L leg  Last BM:  8/29 - type 6  Height:   Ht Readings from Last 1 Encounters:  11/17/22 6\' 3"  (1.905 m)    Weight:   Wt Readings from Last 1 Encounters:  12/02/22 50 kg    Ideal Body Weight:     BMI:  Body mass index is 13.78 kg/m.  Estimated Nutritional Needs:   Kcal:  2000-2200 kcals  Protein:  95-115 gm  Fluid:  1000 mL +UOP  Bethann Humble, RD, LDN, CNSC.

## 2022-11-30 NOTE — Progress Notes (Signed)
Physical Therapy Session Note  Patient Details  Name: Corey [name] MRN: [id number] Date of Birth: 1970-07-31  Today's Date: 11/30/2022 PT Individual Time: 1400-1456 PT Individual Time Calculation (min): 56 min   Short Term Goals: Week 1:  PT Short Term Goal 1 (Week 1): pt will perform bed mobility with supervision PT Short Term Goal 2 (Week 1): pt will transfer bed<>chair with LRAD and CGA PT Short Term Goal 3 (Week 1): pt will transfer sit<>stand with LRAD and min A  Skilled Therapeutic Interventions/Progress Updates:   Received pt sidelying in bed, pt agreeable to PT treatment, and reported pain 9/10 in L residual limb. Pt on 1.5L O2 with SPO2 100% at rest. Pt reported ace wrap this therapist applied earlier was too tight and nursing helped him remove it - no shrinker in room yet but educated pt on importance of wearing shrinker. Session with emphasis on functional mobility/transfers, generalized strengthening and endurance, limb loss education, and WC mobility.   Discussed getting air mattress for pressure relief on buttocks - secure chatted MD. Pt transferred semi-reclined<>sitting L EOB with HOB elevated and CGA using bedrails. Pt politely declined standing this afternoon due to pain in RLE and requested to use slideboard. Pt transferred bed<>WC via slideboard with CGA and assist to place/remove board.   Pt performed WC mobility 147ft using BUE and supervision to dayroom with emphasis on UE strength. Pt performed seated RLE strengthening on Kinetron at 20 cm/sec for 1 minute x 4 trials with therapist providing manual counter resistance with emphasis on glute/quad strength - towel placed under R heel for comfort. Attempted WC pushups, however pt only able to perform x8 reps due to increased pressure/pain on posterior aspect of residual limb. Transitioned to seated bicep curls with 4lb dowel 2x15 and overhead chest press with 4lb dowel 2x10 with emphasis on UE strength. Decreased to 1L and SPO2  96%, then further decreased to RA and SPO2 94-100%. Pt reported mild lightheadedness and SOB during session but improved with increased time and seated rest. Returned to room and concluded session with pt sitting in WC, needs within reach, and seatbelt alarm on. RN notified that pt left on RA with SPO2 95%.   Therapy Documentation Precautions:  Precautions Precautions: Fall, Other (comment) Precaution Comments: watch BP; blind in L eye Restrictions Weight Bearing Restrictions: Yes LLE Weight Bearing: Non weight bearing  Therapy/Group: Individual Therapy [name] [name] PT, DPT 11/30/2022, 7:18 AM

## 2022-11-30 NOTE — Care Management (Signed)
Inpatient Rehabilitation Center Individual Statement of Services  Patient Name:  Corey [name]  Date:  11/30/2022  Welcome to the Inpatient Rehabilitation Center.  Our goal is to provide you with an individualized program based on your diagnosis and situation, designed to meet your specific needs.  With this comprehensive rehabilitation program, you will be expected to participate in at least 3 hours of rehabilitation therapies Monday-Friday, with modified therapy programming on the weekends.  Your rehabilitation program will include the following services:  Physical Therapy (PT), Occupational Therapy (OT), 24 hour per day rehabilitation nursing, Therapeutic Recreaction (TR), Psychology, Neuropsychology, Care Coordinator, Rehabilitation Medicine, Nutrition Services, Pharmacy Services, and Other  Weekly team conferences will be held on Wednesdays to discuss your progress.  Your Inpatient Rehabilitation Care Coordinator will talk with you frequently to get your input and to update you on team discussions.  Team conferences with you and your family in attendance may also be held.  Expected length of stay: 7-10  days   Overall anticipated outcome: Supervision  Depending on your progress and recovery, your program may change. Your Inpatient Rehabilitation Care Coordinator will coordinate services and will keep you informed of any changes. Your Inpatient Rehabilitation Care Coordinator's name and contact numbers are listed  below.  The following services may also be recommended but are not provided by the Inpatient Rehabilitation Center:  Driving Evaluations Home Health Rehabiltiation Services Outpatient Rehabilitation Services Vocational Rehabilitation   Arrangements will be made to provide these services after discharge if needed.  Arrangements include referral to agencies that provide these services.  Your insurance has been verified to be:  [employer] Dual Complete  Your primary doctor  is:  Gwinda Passe  Pertinent information will be shared with your doctor and your insurance company.  Inpatient Rehabilitation Care Coordinator:  [name], [place] [phone] or [phone]  Information discussed with and copy given to patient by: Gretchen Short, 11/30/2022, 2:27 PM

## 2022-11-30 NOTE — Plan of Care (Signed)
  Problem: RH Balance Goal: LTG: Patient will maintain dynamic sitting balance (OT) Description: LTG:  Patient will maintain dynamic sitting balance with assistance during activities of daily living (OT) Flowsheets (Taken 11/30/2022 1210) LTG: Pt will maintain dynamic sitting balance during ADLs with: Independent with assistive device Goal: LTG Patient will maintain dynamic standing with ADLs (OT) Description: LTG:  Patient will maintain dynamic standing balance with assist during activities of daily living (OT)  Flowsheets (Taken 11/30/2022 1210) LTG: Pt will maintain dynamic standing balance during ADLs with: Supervision/Verbal cueing   Problem: Sit to Stand Goal: LTG:  Patient will perform sit to stand in prep for activites of daily living with assistance level (OT) Description: LTG:  Patient will perform sit to stand in prep for activites of daily living with assistance level (OT) Flowsheets (Taken 11/30/2022 1210) LTG: PT will perform sit to stand in prep for activites of daily living with assistance level: Contact Guard/Touching assist   Problem: RH Bathing Goal: LTG Patient will bathe all body parts with assist levels (OT) Description: LTG: Patient will bathe all body parts with assist levels (OT) Flowsheets (Taken 11/30/2022 1210) LTG: Pt will perform bathing with assistance level/cueing: Supervision/Verbal cueing   Problem: RH Dressing Goal: LTG Patient will perform upper body dressing (OT) Description: LTG Patient will perform upper body dressing with assist, with/without cues (OT). Flowsheets (Taken 11/30/2022 1210) LTG: Pt will perform upper body dressing with assistance level of: Set up assist Goal: LTG Patient will perform lower body dressing w/assist (OT) Description: LTG: Patient will perform lower body dressing with assist, with/without cues in positioning using equipment (OT) Flowsheets (Taken 11/30/2022 1210) LTG: Pt will perform lower body dressing with assistance level  of: Set up assist   Problem: RH Toileting Goal: LTG Patient will perform toileting task (3/3 steps) with assistance level (OT) Description: LTG: Patient will perform toileting task (3/3 steps) with assistance level (OT)  Flowsheets (Taken 11/30/2022 1210) LTG: Pt will perform toileting task (3/3 steps) with assistance level: Supervision/Verbal cueing   Problem: RH Toilet Transfers Goal: LTG Patient will perform toilet transfers w/assist (OT) Description: LTG: Patient will perform toilet transfers with assist, with/without cues using equipment (OT) Flowsheets (Taken 11/30/2022 1210) LTG: Pt will perform toilet transfers with assistance level of: Supervision/Verbal cueing

## 2022-11-30 NOTE — Progress Notes (Signed)
Orthopedic Tech Progress Note Patient Details:  Corey [name] June 13, 1970 [id number] Small BK shrinker x3 has been ordered from [facility]  Patient ID: Corey [name], male   DOB: 06/14/1970, 52 y.o.   MRN: [id number]  [name] 11/30/2022, 1:45 PM

## 2022-11-30 NOTE — Progress Notes (Signed)
[facility] NEPHROLOGY PROGRESS NOTE  Subjective:   he was transferred to CIR.  He last had HD on 8/27 with 2 kg UF.  States he pulls 2kg or 3 kg at the most with HD.  On 2 liters oxygen.     Review of systems:    He is a little short of breath at times No chest pain Denies n/v   Objective Vital signs in last 24 hours: Vitals:   11/19/22 1130 11/19/22 1133 11/19/22 1142 11/19/22 1155  BP:  (!) 113/90  128/70  Pulse: 66 67  66  Resp: 12 16  18   Temp:  98 F (36.7 C)  98 F (36.7 C)  TempSrc:  Oral    SpO2: 100% 99%  100%  Weight:   54.5 kg   Height:       Physical Exam:      General adult male in bed in no acute distress HEENT normocephalic atraumatic extraocular movements intact sclera anicteric Neck supple trachea midline Lungs clear to auscultation bilaterally; reduced breath sounds right lung; normal work of breathing at rest on 2 liters  Heart S1S2 no rub Abdomen soft nontender nondistended Extremities no pitting edema; left AKA  Psych normal mood and affect Neuro - alert and oriented x 3 provides hx and follows commands; visually impaired Access RIJ tunn catheter in place; failed accesses bilateral upper extremities       OP HD: TTS [facility] 4h  2/2 bath  66kg   TDC   no heparin (allergy)  lock TDC w/ citrate - No ESA or VDRA - Binder: Renvela pwdr 4.8g TID - Sensipar 180mg  every day at home    Assessment/ Plan  # Acute hypoxic respiratory failure: In setting of missed HD, pna w/ effusion. CXR with pleural effusion s/p chest tube 7/31 now removed.  Improved.    # PNA/ L parapneumonic pleural effusion: s/p thoracentesis with cultures. S/p IV abx course and finished with augmentin  #Left leg wound - s/p AKA 8/15  # ESRD: continue HD per TTS schedule.  UF as able with support of albumin  #Heparin allergy - use citrate for cath lock.  #Hypotension/ volume:  He is on midodrine. UF as tolerated with HD   #Anemia of ESRD: increased aranesp to  100 mcg every Sat for the 8/31 dose onward  # Secondary HPTH: switched to sevelamer per pharm recs.  At one point cinacalcet was held due to hypocalcemia but then resumed at 90 mg daily (half of his normal dose).  Stopped cinacalcet again on 8/26 due to hypocalcemia.  # Hypocalcemia - see above - holding sensipar  #A-fib: On amiodarone and eliquis - per primary team; has IVC filter  # H/o type B aortic dissection s/p repair (2004)   just transferred to rehab.  Dispo per primary team      Recent Labs  Lab 11/28/22 0900 11/29/22 0352  HGB 8.4* 8.1*  ALBUMIN 2.4*  --   CALCIUM 6.6* 6.6*  PHOS 2.0* 2.0*  CREATININE 5.19* 5.76*  K 3.6 4.3    Inpatient medications:  acetaminophen  650 mg Oral TID WC & HS   amiodarone  200 mg Oral Daily   amitriptyline  10 mg Oral QHS   apixaban  2.5 mg Oral BID   bacitracin   Topical BID   Chlorhexidine Gluconate Cloth  6 each Topical Q12H   [START ON 12/03/2022] darbepoetin (ARANESP) injection - DIALYSIS  100 mcg Subcutaneous Q Sat-1800   docusate  sodium  100 mg Oral Daily   feeding supplement  237 mL Oral BID BM   gabapentin  300 mg Oral QHS   hydrocerin   Topical Daily   lidocaine  3 patch Transdermal Q24H   midodrine  15 mg Oral TID WC   multivitamin  1 tablet Oral QHS   polyethylene glycol  17 g Oral BID   senna-docusate  1 tablet Oral BID   sevelamer carbonate  4.8 g Oral TID WC     acetaminophen, bisacodyl, calcium carbonate (dosed in mg elemental calcium), camphor-menthol **AND** hydrOXYzine, diphenhydrAMINE, guaiFENesin-dextromethorphan, methocarbamol, milk and molasses, oxyCODONE, phenol, prochlorperazine **OR** prochlorperazine **OR** prochlorperazine, simethicone, sorbitol, zolpidem  Estanislado Emms, MD 6:44 PM 11/30/2022

## 2022-12-01 LAB — RENAL FUNCTION PANEL
Albumin: 2.6 g/dL — ABNORMAL LOW (ref 3.5–5.0)
Anion gap: 15 (ref 5–15)
BUN: 35 mg/dL — ABNORMAL HIGH (ref 6–20)
CO2: 25 mmol/L (ref 22–32)
Calcium: 8.1 mg/dL — ABNORMAL LOW (ref 8.9–10.3)
Chloride: 99 mmol/L (ref 98–111)
Creatinine, Ser: 5.42 mg/dL — ABNORMAL HIGH (ref 0.61–1.24)
GFR, Estimated: 12 mL/min — ABNORMAL LOW (ref >60)
Glucose, Bld: 91 mg/dL (ref 70–99)
Phosphorus: 2.5 mg/dL (ref 2.5–4.6)
Potassium: 3.8 mmol/L (ref 3.5–5.1)
Sodium: 139 mmol/L (ref 135–145)

## 2022-12-01 LAB — CBC
HCT: 28.9 % — ABNORMAL LOW (ref 39.0–52.0)
Hemoglobin: 8.3 g/dL — ABNORMAL LOW (ref 13.0–17.0)
MCH: 27.1 pg (ref 26.0–34.0)
MCHC: 28.7 g/dL — ABNORMAL LOW (ref 30.0–36.0)
MCV: 94.4 fL (ref 80.0–100.0)
Platelets: 231 10*3/uL (ref 150–400)
RBC: 3.06 MIL/uL — ABNORMAL LOW (ref 4.22–5.81)
RDW: 17.9 % — ABNORMAL HIGH (ref 11.5–15.5)
WBC: 9 10*3/uL (ref 4.0–10.5)
nRBC: 0 % (ref 0.0–0.2)

## 2022-12-01 LAB — VITAMIN D 25 HYDROXY (VIT D DEFICIENCY, FRACTURES): Vit D, 25-Hydroxy: 43.35 ng/mL (ref 30–100)

## 2022-12-01 MED ORDER — LIDOCAINE HCL (PF) 1 % IJ SOLN: 5.0000 mL | INTRAMUSCULAR | Status: DC | PRN

## 2022-12-01 MED ORDER — ANTICOAGULANT SODIUM CITRATE 4% (200MG/5ML) IV SOLN
5.0000 mL | Status: DC | PRN
  Administered 2022-12-01: 5 mL
  Filled 2022-12-01: qty 5

## 2022-12-01 MED ORDER — TRAMADOL HCL 50 MG PO TABS
50.0000 mg | ORAL_TABLET | Freq: Four times a day (QID) | ORAL | Status: DC | PRN
  Administered 2022-12-01 – 2022-12-12 (×24): 50 mg via ORAL
  Filled 2022-12-01 (×26): qty 1

## 2022-12-01 MED ORDER — ALTEPLASE 2 MG IJ SOLR: 2.0000 mg | Freq: Once | INTRAMUSCULAR | Status: DC | PRN

## 2022-12-01 MED ORDER — LIDOCAINE HCL URETHRAL/MUCOSAL 2 % EX GEL
Freq: Three times a day (TID) | CUTANEOUS | Status: DC
  Administered 2022-12-04: 6 via TOPICAL
  Administered 2022-12-05: 1 via TOPICAL
  Administered 2022-12-06: 6 via TOPICAL
  Administered 2022-12-12: 1 via TOPICAL
  Filled 2022-12-01 (×10): qty 6

## 2022-12-01 MED ORDER — OXYCODONE HCL 5 MG PO TABS: 5.0000 mg | ORAL_TABLET | ORAL | Status: DC | PRN

## 2022-12-01 MED ORDER — AMITRIPTYLINE HCL 25 MG PO TABS
25.0000 mg | ORAL_TABLET | Freq: Every day | ORAL | Status: DC
  Administered 2022-12-01: 25 mg via ORAL
  Filled 2022-12-01: qty 1

## 2022-12-01 MED ORDER — PENTAFLUOROPROP-TETRAFLUOROETH EX AERO: 1.0000 | INHALATION_SPRAY | CUTANEOUS | Status: DC | PRN

## 2022-12-01 MED ORDER — LIDOCAINE-PRILOCAINE 2.5-2.5 % EX CREA: 1.0000 | TOPICAL_CREAM | CUTANEOUS | Status: DC | PRN

## 2022-12-01 MED ORDER — ACETAMINOPHEN 325 MG PO TABS: 650.0000 mg | ORAL_TABLET | Freq: Three times a day (TID) | ORAL | Status: DC

## 2022-12-01 NOTE — Progress Notes (Signed)
Met with the patient to review current situation, rehab process, team conference and plan of care. Discussed current medications and diet with 1200 CC FR.  Patient noted limited by pain; "throbbing" like it is going to explode" and phantom pain with meds on board. Able to tolerate therapy sessions. Reported concerns regarding discharge. Forwarded to SW for discharge planning with regards to set up of HD in [place], PCP and insurance transfer. Daughter is aware and will be coming in for the weekend to review with the patient. Patient reported he has a fairly new W/C and therapy will add items for new left AKA; . Assisted pt to reconnect access to My Chart; await information on MyChart user name in his personal email. Aware that he can requests records sent to a new MD/PCP once established via My Chart. Patient with low vision; notebook for reference left with the patient to share with his family to use to help provide care. Continue to follow along to address educational needs to facilitate preparation for discharge. Corey [name]

## 2022-12-01 NOTE — Progress Notes (Addendum)
   12/01/22 1755  Vitals  Temp (!) 97.4 F (36.3 C)  Pulse Rate 67  Resp (!) 22  BP 109/72  SpO2 96 %  O2 Device Room Air  Weight  (bed unable to weigh)  Oxygen Therapy  Patient Activity (if Appropriate) In bed  Pulse Oximetry Type Continuous  Post Treatment  Dialyzer Clearance Lightly streaked  Hemodialysis Intake (mL) 0 mL  Liters Processed 79.7  Fluid Removed (mL) 2800 mL (mLs)  Tolerated HD Treatment Yes  Post-Hemodialysis Comments tolerated well. Vitals stable/pt alert  During Treatment Monitoring  Duration of HD Treatment -hour(s) 3.15 hour(s)   Received patient in bed to unit.  Alert and oriented.  Informed consent signed and in chart.   TX duration:3hr 15 minutes. Patient requested to stop treatment 12 minutes early. AMA signed  Patient tolerated well.  Transported back to the room  Alert, without acute distress.  Hand-off given to patient's nurse.   Access used: [facility] Access issues: none  Total UF removed: 2.8L Medication(s) given: none    [facility]

## 2022-12-01 NOTE — Progress Notes (Signed)
Physical Therapy Session Note  Patient Details  Name: Corey [name] MRN: [id number] Date of Birth: 01/13/1971  Today's Date: 12/01/2022 PT Individual Time: 1115-1155 PT Individual Time Calculation (min): 40 min   Short Term Goals: Week 1:  PT Short Term Goal 1 (Week 1): pt will perform bed mobility with supervision PT Short Term Goal 2 (Week 1): pt will transfer bed<>chair with LRAD and CGA PT Short Term Goal 3 (Week 1): pt will transfer sit<>stand with LRAD and min A  Skilled Therapeutic Interventions/Progress Updates:    Pt seated in w/c on arrival and agreeable to therapy. Pt reports up to 8/10 pain in his LLE and side, premedicated. Rest and positioning provided as needed.   Pt propelled w/c with BUE 2 x 60-80 ft with min A for steering as w/c is pulling to L side. Pt reports some chest tightness at this time so transported remaining distance. Pt found to have O2 sats as low as 92 but rising quickly. Pt on RA and largely tolerating well. Monitored throughout and pt found to have O2 sats as low as 88 that recovered quickly to >95% when pt was cued to breathe deeply.  Pt performed w/c pushups 3 x 10 with cues to hold push up longer on last sets. Pt was able to clear sacrum but not sits bones. During rest breaks, discussed that since pt has altered sensation in his buttocks (pressure only) he will need to monitor his skin and shift positions or perform pressure relief regularly.   Pt returned to room and required light min A and assist to place board for slideboard transfer to bed. CGA sit>supine. Pt was able to use 3 extremities and assist with pulling up in the bed. Pt remained and was left with all needs in reach.   Therapy Documentation Precautions:  Precautions Precautions: Fall, Other (comment) Precaution Comments: watch BP; blind in L eye Restrictions Weight Bearing Restrictions: Yes LLE Weight Bearing: Non weight bearing General:       Therapy/Group: Individual  Therapy  Juluis Rainier 12/01/2022, 12:07 PM

## 2022-12-01 NOTE — Progress Notes (Signed)
Discussed patient with Dr. Laverle Patter who has reviewed photo and question calciphylaxis. He felt that this was likely chronic and to use xeroform guaze with gauze to cover it and allow for debridement. To call  if it decline and to follow up with GU if no improvement.

## 2022-12-01 NOTE — Progress Notes (Signed)
Physical Therapy Session Note  Patient Details  Name: Corey [name] MRN: [id number] Date of Birth: 12-15-1970  Today's Date: 12/01/2022 PT Individual Time: 0930-1041 PT Individual Time Calculation (min): 71 min   Short Term Goals: Week 1:  PT Short Term Goal 1 (Week 1): pt will perform bed mobility with supervision PT Short Term Goal 2 (Week 1): pt will transfer bed<>chair with LRAD and CGA PT Short Term Goal 3 (Week 1): pt will transfer sit<>stand with LRAD and min A  Skilled Therapeutic Interventions/Progress Updates:   Received pt sitting in WC, pt agreeable to PT treatment, and reported pain 8/10 in L residual limb (premedicated). Care coordinator present helping pt set up MyChart and CSW arrived for brief update on D/C plan - pt moving to [place] to live with daughter. Session with emphasis on functional mobility/transfers, generalized strengthening and endurance, limb loss education, and simulated car transfers. SPO2 100% on 2L - decreased to RA and SPO2 remained  91>% throughout remainder of session.   Pt performed WC mobility ~137ft using BUE and supervision with supervision to fatigue - emphasis on UE strength. Pt transported remainder of way to ortho gym and performed simulated car transfer via slideboard with CGA and cues for technique. Pt unsure what kind of car his daughter's husband will be picking him up in - plans to find out. Pt then performed seated BUE strengthening on UBE at level 2 alternating 1 minute forwards and 1 minute backwards for 3 minutes to fatigue. Pt transferred to/from mat via slideboard with CGA and performed the following exercises with emphasis on UE/LE strength and ROM: -R hip flexion 2x10 with 1.5lb ankle weight -lateral trunk rotations with 1lb medicine ball 2x10 bilaterally -bicep curls with 1.5lb dumbbell 2x15 bilaterally -horizontal chest press with 1.5lb dumbbell 2x10 -R knee extension 2x10 with 1.5lb ankle weight - limited ROM  -passive hamstring  stretch with overpressure x30 second hold - noted contracture in R knee Of note, pt sits with extreme kyphosis, thoracic rounding, and posterior pelvic tilt - able to correct but unable to maintain optimal posture due to back pain. Scott from [place] arrived with shrinkers but unable to don while in gym due to tight fitting pants. Removed non-skid sock on RLE to inspect skin, noted curling of toenails with hard, callused, and flaky skin but no sores noted. Returned to room and concluded session with pt sitting in WC, needs within reach, and seatbelt alarm on. Pt left on RA and RN notified.   Therapy Documentation Precautions:  Precautions Precautions: Fall, Other (comment) Precaution Comments: watch BP; blind in L eye Restrictions Weight Bearing Restrictions: Yes LLE Weight Bearing: Non weight bearing   Therapy/Group: Individual Therapy [name] [name] PT, DPT 12/01/2022, 6:50 AM

## 2022-12-01 NOTE — Progress Notes (Signed)
[facility] NEPHROLOGY PROGRESS NOTE  Subjective:  Seen and examined on dialysis.  Procedure supervised.  Blood pressure 98/65 and HR 66.  Tolerating goal.  RIJ tunn catheter in use. HD SW is working on getting him an outpatient HD unit.  He plans to move to [place], [place] after discharge from rehab.   Review of systems:   He is a little short of breath at times No chest pain Denies n/v   Objective Vital signs in last 24 hours: Vitals:   11/19/22 1130 11/19/22 1133 11/19/22 1142 11/19/22 1155  BP:  (!) 113/90  128/70  Pulse: 66 67  66  Resp: 12 16  18   Temp:  98 F (36.7 C)  98 F (36.7 C)  TempSrc:  Oral    SpO2: 100% 99%  100%  Weight:   54.5 kg   Height:       Physical Exam:      General adult male in bed in no acute distress HEENT normocephalic atraumatic extraocular movements intact sclera anicteric Neck supple trachea midline Lungs clear to auscultation bilaterally; reduced breath sounds right lung; normal work of breathing at rest Heart S1S2 no rub Abdomen soft nontender nondistended Extremities no pitting edema; left AKA  Psych normal mood and affect Neuro - alert and oriented x 3 provides hx and follows commands; visually impaired Access RIJ tunn catheter in place; failed accesses bilateral upper extremities       OP HD: TTS [facility] 4h  2/2 bath  66kg   TDC   no heparin (allergy)  lock TDC w/ citrate - No ESA or VDRA - Binder: Renvela pwdr 4.8g TID - Sensipar 180mg  every day at home    Assessment/ Plan  # Acute hypoxic respiratory failure: In setting of missed HD, pna w/ effusion. CXR with pleural effusion s/p chest tube 7/31 now removed.  Improved.    # PNA/ L parapneumonic pleural effusion: s/p thoracentesis with cultures. S/p IV abx course and finished with augmentin  #Left leg wound - s/p AKA 8/15  # ESRD: continue HD per TTS schedule.  UF as able with support of albumin  #Heparin allergy - use citrate for cath  lock.  #Hypotension/ volume:  He is on midodrine. UF as tolerated with HD   #Anemia of ESRD: increased aranesp to 100 mcg every Sat for the 8/31 dose onward  # Secondary HPTH: switched to sevelamer per pharm recs.  At one point cinacalcet was held due to hypocalcemia but then resumed at 90 mg daily (half of his normal dose).  Stopped cinacalcet again on 8/26 due to hypocalcemia.  # Hypocalcemia - see above - holding sensipar  #A-fib: On amiodarone and eliquis - per primary team; has IVC filter  # H/o type B aortic dissection s/p repair (2004)   Dispo - We are getting him a new outpatient HD unit.  He plans to relocate to [place], [place] after discharge.     Recent Labs  Lab 11/28/22 0900 11/29/22 0352 12/01/22 0437  HGB 8.4* 8.1* 8.3*  ALBUMIN 2.4*  --  2.6*  CALCIUM 6.6* 6.6* 8.1*  PHOS 2.0* 2.0* 2.5  CREATININE 5.19* 5.76* 5.42*  K 3.6 4.3 3.8    Inpatient medications:  acetaminophen  650 mg Oral TID WC & HS   amiodarone  200 mg Oral Daily   amitriptyline  25 mg Oral QHS   apixaban  2.5 mg Oral BID   bacitracin   Topical BID   Chlorhexidine Gluconate Cloth  6 each Topical Q12H   [START ON 12/03/2022] darbepoetin (ARANESP) injection - DIALYSIS  100 mcg Subcutaneous Q Sat-1800   docusate sodium  100 mg Oral Daily   feeding supplement  237 mL Oral BID BM   gabapentin  300 mg Oral QHS   hydrocerin   Topical Daily   lidocaine  3 patch Transdermal Q24H   lidocaine   Topical TID   midodrine  15 mg Oral TID WC   multivitamin  1 tablet Oral QHS   polyethylene glycol  17 g Oral BID   senna-docusate  1 tablet Oral BID   sevelamer carbonate  4.8 g Oral TID WC    anticoagulant sodium citrate      acetaminophen, alteplase, anticoagulant sodium citrate, bisacodyl, calcium carbonate (dosed in mg elemental calcium), camphor-menthol **AND** hydrOXYzine, diphenhydrAMINE, guaiFENesin-dextromethorphan, lidocaine (PF), lidocaine-prilocaine, methocarbamol, milk and molasses,  pentafluoroprop-tetrafluoroeth, phenol, prochlorperazine **OR** prochlorperazine **OR** prochlorperazine, simethicone, sorbitol, traMADol, zolpidem  Estanislado Emms, MD 2:49 PM 12/01/2022

## 2022-12-01 NOTE — Progress Notes (Signed)
Contacted by rehab CSW yesterday to be advised that pt will need a HD clinic in [place], [place] when d/c from rehab. Met with pt this morning to obtain address for where pt will be living at d/c. Pt states he plans to move to [facility] permanently. Clinicals and transfer request submitted to [facility] admissions today. Also contacted pt's local clinic to make them aware of request for transfer. Will update nephrology providers as well. Pt states he will need transportation to HD in [place]. This info provided to rehab CSW. Will assist as needed.   Olivia Canter Renal Navigator [phone]

## 2022-12-01 NOTE — Progress Notes (Signed)
Occupational Therapy Session Note  Patient Details  Name: Corey [name] MRN: [id number] Date of Birth: 03/29/1971  Today's Date: 12/01/2022 OT Individual Time: 6295-2841 OT Individual Time Calculation (min): 75 min    Short Term Goals: Week 1:  OT Short Term Goal 1 (Week 1): Pt will complete LB dressing with CGA OT Short Term Goal 2 (Week 1): Pt will report < 5/10 pain for 2 consecutive sessions OT Short Term Goal 3 (Week 1): Pt will complete toileting with CGA  Skilled Therapeutic Interventions/Progress Updates:  Pt greeted asleep in supine needing increased time to arouse. Pt on 2L during session with Spo2 > 90% throughout session. Pt then completed supine<sit to L side of bed with light MIN A to elevate trunk. Pt requesting air be added to roho cushion. Retrieved pump and inserted additional air for comfort. Pt declined donning fresh clothes this AM. Pt completed SB transfer to w/c to R side with total A for board placement, pt able to complete SB to R side with MINA.   Once in w/c, doffed ace wrap on residual limb, nurse entered to assess incision, applying non adherent bandage to incision and donning fresh ace wrap. Primary PT confirmed that shrinker to be delivered today. Education provided on importance of compression to residual limb for eventual prosthetic as pt c/o pain from compression. Additional education provided on touching end of residual limb to decrease phantom pain/sensation. Pt reports he's not ready to touch it yet.   Pt completed w/c propulsion to sink with supervision, pt completed seated grooming at sink with supervision. Total A transport to gym where pt completed multiple slide board transfer from EOM<>w/c with an emphasis on placing board, pt able to place board with MIN cues for positioning and completed transfers with CGA. Pt reports he doesn't feel like he has the strength to stand on his RLE, encouraged pt to attempt later with PT.   Total A transport back to bed  with pt left up in w/c eating breakfast with all needs within reach and safety belt activated.  Therapy Documentation Precautions:  Precautions Precautions: Fall, Other (comment) Precaution Comments: watch BP; blind in L eye Restrictions Weight Bearing Restrictions: Yes LLE Weight Bearing: Non weight bearing  Pain: unrated pain reported in chest from chest tube and L AKA, rest breaks and repositioning utilized as needed    Therapy/Group: Individual Therapy  Pollyann Glen [facility] 12/01/2022, 10:09 AM

## 2022-12-01 NOTE — Progress Notes (Signed)
PROGRESS NOTE   Subjective/Complaints: No new complaints this morning Labs reviewed and are stable Did not note benefits or side effects from amitriptyline last night  ROS: +penile pain, +residual limb pain   Objective:   No results found. Recent Labs    11/29/22 0352 12/01/22 0437  WBC 10.3 9.0  HGB 8.1* 8.3*  HCT 28.2* 28.9*  PLT 273 231   Recent Labs    11/29/22 0352 12/01/22 0437  NA 137 139  K 4.3 3.8  CL 102 99  CO2 23 25  GLUCOSE 92 91  BUN 39* 35*  CREATININE 5.76* 5.42*  CALCIUM 6.6* 8.1*    Intake/Output Summary (Last 24 hours) at 12/01/2022 0954 Last data filed at 11/30/2022 1318 Gross per 24 hour  Intake 472 ml  Output --  Net 472 ml        Physical Exam: Vital Signs Blood pressure 97/64, pulse 71, temperature 98.9 F (37.2 C), temperature source Oral, resp. rate 14, weight 53 kg, SpO2 97%. Gen: no distress, normal appearing, BMI is 14.60 Eyes:     General:        Right eye: No discharge.        Left eye: No discharge.     Comments: Injected. Does not make eye contact but looks in general direction.  Says can see shapes, but nothing clear  Cardiovascular:     Rate and Rhythm: Normal rate and regular rhythm.     Heart sounds: Normal heart sounds. No murmur heard.    No gallop.  Pulmonary:     Effort: Pulmonary effort is normal. No respiratory distress.     Breath sounds: No wheezing, rhonchi or rales.     Comments: Decreased at bases R>L even though chest tube was on L side Abdominal:     General: Abdomen is flat. There is no distension.     Palpations: Abdomen is soft.     Tenderness: There is no abdominal tenderness.  Musculoskeletal:        General: No swelling or tenderness.     Cervical back: Neck supple. No tenderness.     Comments: Amputation site with staples in place and pillow under AKA with multiple dried areas of serous drainage. Right foot with overgrown toe nails.    Ues 5-/5 B/L RLE- HF 4-/5; otherwise DF 0/5 and PF and KE 2-/5 LLE- L AKA- with HF 4-/5   Skin:    General: Skin is warm and dry.     Comments: Boggy area infra clavicular area--non tender and reported to be stable. Multiple grafts RUE--non functional. R-internal jugular noted. RLE with areas of discoloration and stasis changes.  R HD catheter looks OK 2 IV"s LUE- look OK L aka has staples intact- dog ears notable, not shaping so far Not significant edema, however Leg/ L AKA- tip elevated on pillow- said per Surgeon  Neurological:     Mental Status: He is alert and oriented to person, place, and time.     Comments: Decreased to absent below R knee  Psychiatric:        Mood and Affect: Mood normal.        Behavior: Behavior normal.  Assessment/Plan: 1. Functional deficits which require 3+ hours per day of interdisciplinary therapy in a comprehensive inpatient rehab setting. Physiatrist is providing close team supervision and 24 hour management of active medical problems listed below. Physiatrist and rehab team continue to assess barriers to discharge/monitor patient progress toward functional and medical goals  Care Tool:  Bathing    Body parts bathed by patient: Right arm, Left arm, Chest, Abdomen, Front perineal area, Buttocks, Right upper leg, Left upper leg, Face, Right lower leg     Body parts n/a: Left lower leg   Bathing assist Assist Level: Minimal Assistance - Patient > 75%     Upper Body Dressing/Undressing Upper body dressing   What is the patient wearing?: Pull over shirt    Upper body assist Assist Level: Minimal Assistance - Patient > 75%    Lower Body Dressing/Undressing Lower body dressing      What is the patient wearing?: Incontinence brief, Pants     Lower body assist Assist for lower body dressing: Moderate Assistance - Patient 50 - 74%     Toileting Toileting    Toileting assist Assist for toileting: Moderate Assistance - Patient 50 -  74%     Transfers Chair/bed transfer  Transfers assist     Chair/bed transfer assist level: Minimal Assistance - Patient > 75% (slideboard)     Locomotion Ambulation   Ambulation assist   Ambulation activity did not occur: Safety/medical concerns (pain, fear of falling, decreased balance, protection of contralateral limb)          Walk 10 feet activity   Assist  Walk 10 feet activity did not occur: Safety/medical concerns (pain, fear of falling, decreased balance, protection of contralateral limb)        Walk 50 feet activity   Assist Walk 50 feet with 2 turns activity did not occur: Safety/medical concerns (pain, fear of falling, decreased balance, protection of contralateral limb)         Walk 150 feet activity   Assist Walk 150 feet activity did not occur: Safety/medical concerns (pain, fear of falling, decreased balance, protection of contralateral limb)         Walk 10 feet on uneven surface  activity   Assist Walk 10 feet on uneven surfaces activity did not occur: Safety/medical concerns (pain, fear of falling, decreased balance, protection of contralateral limb)         Wheelchair     Assist Is the patient using a wheelchair?: Yes Type of Wheelchair: Manual Wheelchair activity did not occur: Safety/medical concerns (pain)         Wheelchair 50 feet with 2 turns activity    Assist    Wheelchair 50 feet with 2 turns activity did not occur: Safety/medical concerns (pain)       Wheelchair 150 feet activity     Assist  Wheelchair 150 feet activity did not occur: Safety/medical concerns (pain)       Blood pressure 97/64, pulse 71, temperature 98.9 F (37.2 C), temperature source Oral, resp. rate 14, weight 53 kg, SpO2 97%.  Medical Problem List and Plan: 1. Functional deficits secondary to  L AKA secondary to left foot wound 2/2 PAD             -patient may not shower due to R HD catheter             -ELOS/Goals:  12-14 days supervision Continue CIR   2.  Antithrombotics: -DVT/anticoagulation:  Pharmaceutical: Eliquis. IVC filter in place.              -  antiplatelet therapy: N/A 3. Penile pain: topical bacitracin ordered 4. Insomnia: add amitriptyline 10mg  HS 5. Neuropsych/cognition: This patient is capable of making decisions on his own behalf. 6. Skin/Wound Care: Routine pressure relief measure 7. Fluids/Electrolytes/Nutrition:  Strict I/O. Daily weights. Diet changed to Renal per input from nephrology. 8. L-AKA: Monitor incision for healing. Dry dressing. Shrinkers ordered  9. ESRD: Renal diet with 1200 cc FR/HD TTS at the end of the day to help with tolerance of therapy.  10. H/o AAA aneurysm rupture s/p repair:  with resultant paraplegia, sensory deficits from saddle down and cauda equina syndrome             --was able to walk till a few years ago when left knee gave out.              --has severe LLQ pain prior to defecation. Continue Senna/miralax--discussed suppository every other day but patient wants to think on it "that's too much" 11. Anemia of chronic disease:] 12. L- PNA and parapneumonic effusion s/p CT: Has completed 2 week course of antibiotics.  13. Fluid overload: Continue to monitor for symptoms and wean oxygen as tolerated.  14. Neurogenic bowel: Will monitor for now.    15. Neuropathic pain- will change gabapentin to 300 mg at bedtime, increase amitriptyline to 25mg  BID  16. Leukocytosis: WBC reviewed and has normalized  17. Underweight: provide dietary education  18. Screening for vitamin D deficiency: add on vitamin D level today  I spent >73mins performing patient care related activities, including face to face time, documentation time, med management, discussion of meds and lab orders with patient, and overall coordination of care.      LOS: 2 days A FACE TO FACE EVALUATION WAS PERFORMED  Drema Pry Larri Brewton 12/01/2022, 9:54 AM

## 2022-12-01 NOTE — Progress Notes (Addendum)
Patient ID: Corey [name], male   DOB: 1970-12-03, 52 y.o.   MRN: [id number]  This SW covering for primary SW, [name].   SW met with pt during PT session. Pt already has a w/c and obtained 1.5 yrs ago. Will need left amputee pad.   SW received updates from Dialysis Coord- Olivia Canter reporting request for transfer was submitted to [facility] in [place] and waiting on follow-up.    SW spoke with Leeroy Bock with [facility] to make an effort to schedule new pt appointment but informed that current insurance plan comes up as a [facility] HMO and they only accept HMO PPO plans. They also confirm they do not accept Medicaid as well.   1447- SW left message for pt dtr [name] to inform on above about trouble with obtaining PCP and suggested another insurance plan when considering to transfer to a plan in the area. Should consider calling Medicare with this new issue.   SW ordered left amputee pad with [employer] via parachute.   *Unable to enter assessment since in dialysis and chart for CIR is not accessible.   [name], MSW, LCSWA Office: [phone] Cell: [phone] Fax: [phone]

## 2022-12-02 MED ORDER — AMITRIPTYLINE HCL 10 MG PO TABS
10.0000 mg | ORAL_TABLET | Freq: Every day | ORAL | Status: DC
  Administered 2022-12-02 – 2022-12-03 (×2): 10 mg via ORAL
  Filled 2022-12-02 (×2): qty 1

## 2022-12-02 MED ORDER — CHLORHEXIDINE GLUCONATE CLOTH 2 % EX PADS
6.0000 | MEDICATED_PAD | Freq: Every day | CUTANEOUS | Status: DC
  Administered 2022-12-06 – 2022-12-10 (×4): 6 via TOPICAL

## 2022-12-02 NOTE — Progress Notes (Signed)
[facility] NEPHROLOGY PROGRESS NOTE  Subjective:  Last HD on 8/29 with 2.8 kg UF.  Sats are charted as 81% and staff came into the room and recheck him for me and got 98-100% on 2 liters.  Appreciate their assistance.   Review of systems:   He is a little short of breath at times but thinks breathing today is better than yesterday  No chest pain Denies n/v   Objective Vital signs in last 24 hours:    12/02/2022    1:28 PM 12/02/2022    6:05 AM 12/01/2022    8:14 PM  Vitals with BMI  Weight  110 lbs 4 oz   BMI  13.78   Systolic 87 110 124  Diastolic 58 72 76  Pulse 74 74 68     Physical Exam:      General adult male in bed in no acute distress HEENT normocephalic atraumatic extraocular movements intact sclera anicteric Neck supple trachea midline Lungs clear to auscultation bilaterally; reduced breath sounds right lung; normal work of breathing at rest on 2 liters Heart S1S2 no rub Abdomen soft nontender nondistended Extremities trace edema; left AKA  Psych normal mood and affect Neuro - alert and oriented x 3 provides hx and follows commands; visually impaired Access RIJ tunn catheter in place; failed accesses bilateral upper extremities       OP HD: TTS [facility] 4h  2/2 bath  66kg   TDC   no heparin (allergy)  lock TDC w/ citrate - No ESA or VDRA - Binder: Renvela pwdr 4.8g TID - Sensipar 180mg  every day at home    Assessment/ Plan  # Acute hypoxic respiratory failure: In setting of missed HD, pna w/ effusion. CXR with pleural effusion s/p chest tube 7/31 now removed.  Improved.    # PNA/ L parapneumonic pleural effusion: s/p thoracentesis with cultures. S/p IV abx course and finished with augmentin  #Left leg wound - s/p AKA 8/15  # ESRD: continue HD per TTS schedule.  UF as able with support of albumin.  Hypotension limits UF.  - note that he will need a lower EDW given the amputation.  Listed as 50 kg on 12/02/22 AM (the morning after HD).     #Heparin allergy - use citrate for cath lock.  #Hypotension/ volume:  He is on midodrine. UF as tolerated with HD   #Anemia of ESRD: have increased aranesp to 100 mcg every Sat for the 8/31 dose onward  # Secondary HPTH: switched to sevelamer per pharm recs.  At one point cinacalcet was held due to hypocalcemia but then resumed at 90 mg daily (half of his normal dose).  Stopped cinacalcet again on 8/26 due to hypocalcemia.  # Hypocalcemia - see above - holding sensipar.  Improved after stopping sensipar.  May be able to start back at a lower dose if needed soon.  #A-fib: On amiodarone and eliquis - per primary team; has IVC filter  # H/o type B aortic dissection s/p repair (2004)   Dispo - We are getting him a new outpatient HD unit.  He plans to relocate to [place], [place] after discharge.     Recent Labs  Lab 11/28/22 0900 11/29/22 0352 12/01/22 0437  HGB 8.4* 8.1* 8.3*  ALBUMIN 2.4*  --  2.6*  CALCIUM 6.6* 6.6* 8.1*  PHOS 2.0* 2.0* 2.5  CREATININE 5.19* 5.76* 5.42*  K 3.6 4.3 3.8    Inpatient medications:  acetaminophen  650 mg Oral TID WC &  HS   amiodarone  200 mg Oral Daily   amitriptyline  10 mg Oral QHS   apixaban  2.5 mg Oral BID   bacitracin   Topical BID   Chlorhexidine Gluconate Cloth  6 each Topical Q12H   [START ON 12/03/2022] darbepoetin (ARANESP) injection - DIALYSIS  100 mcg Subcutaneous Q Sat-1800   docusate sodium  100 mg Oral Daily   feeding supplement  237 mL Oral BID BM   gabapentin  300 mg Oral QHS   hydrocerin   Topical Daily   lidocaine  3 patch Transdermal Q24H   lidocaine   Topical TID   midodrine  15 mg Oral TID WC   multivitamin  1 tablet Oral QHS   polyethylene glycol  17 g Oral BID   senna-docusate  1 tablet Oral BID   sevelamer carbonate  4.8 g Oral TID WC      acetaminophen, bisacodyl, calcium carbonate (dosed in mg elemental calcium), camphor-menthol **AND** hydrOXYzine, diphenhydrAMINE, guaiFENesin-dextromethorphan,  methocarbamol, milk and molasses, phenol, prochlorperazine **OR** prochlorperazine **OR** prochlorperazine, simethicone, sorbitol, traMADol, zolpidem  Estanislado Emms, MD 2:50 PM 12/02/2022

## 2022-12-02 NOTE — Progress Notes (Signed)
Physical Therapy Session Note  Patient Details  Name: Corey [name] MRN: [id number] Date of Birth: 08-01-1970  Today's Date: 12/02/2022 PT Individual Time: 0720-0815 PT Individual Time Calculation (min): 55 min   Short Term Goals: Week 1:  PT Short Term Goal 1 (Week 1): pt will perform bed mobility with supervision PT Short Term Goal 2 (Week 1): pt will transfer bed<>chair with LRAD and CGA PT Short Term Goal 3 (Week 1): pt will transfer sit<>stand with LRAD and min A  Skilled Therapeutic Interventions/Progress Updates:     Chart reviewed and pt agreeable to therapy. Pt received semi-reclined in bed with 9/10 c/o pain in L chest area and surgical site, premedicated. Session focused on bed mobility, functional transfers, and WC mobility to promote STGs and advance towards safe home access. Pt initiated session with transfer to EOB using SBA. Pt then completed slide board transfer using light CGA progressing to S. Pt able to manage WC parts and move to sink for self care at S level seated. Pt then completed 167ft WC propulsion with S. Pt noted to need increased time but able to manage pathway without physical assistance. Of note, pt found on 3L O2 via [place]. Pt SpO2 monitored t/o session. Pt placed on RA with SpO2 of 99% at rest. Pt then completed 122ft WC propulsion and SpO2 found to be 89%, so pt instructed on PLB and able to recover SpO2 to 97% in less than 1 min on RA. At end of session, pt was left seated in [facility] on RA with alarm engaged, nurse call bell and all needs in reach.    Therapy Documentation Precautions:  Precautions Precautions: Fall, Other (comment) Precaution Comments: watch BP; blind in L eye Restrictions Weight Bearing Restrictions: Yes LLE Weight Bearing: Non weight bearing General:      Therapy/Group: Individual Therapy  Dionne Milo, PT, DPT 12/02/2022, 8:20 AM

## 2022-12-02 NOTE — Progress Notes (Signed)
Inpatient Rehabilitation Care Coordinator Assessment and Plan Patient Details  Name: Corey [name] MRN: [id number] Date of Birth: 1971/02/28  Today's Date: 12/02/2022  Hospital Problems: Principal Problem:   S/P AKA (above knee amputation), left (HCC) Active Problems:   ESRD on dialysis (HCC)   Protein-calorie malnutrition, severe   Debility  Past Medical History:  Past Medical History:  Diagnosis Date   Arthritis    hands and shoulders   Blindness and low vision    "Stargardt disease"   Dissection of aorta (HCC) 2004   a. s/p extensive repeair in 2004 in [place] complicated by ESRD, lower extremity paralysis, coma, and extended hospitalization of 2 years   ESRD (end stage renal disease) (HCC)    a. TTS   Headache    History of cardioversion 2014   Hypertension    Neuropathy    Non-healing non-surgical wound 03/2016   PAF (paroxysmal atrial fibrillation) (HCC)    a. s/p DCCV in 2014; b. on Coumadin; c. CHADS2VASc => 2 (HTN, vascular disease)   Paralysis (HCC)    due to dissection of aorta in 2004, lower extremities   Pneumonia    Past Surgical History:  Past Surgical History:  Procedure Laterality Date   AMPUTATION Left 11/17/2022   Procedure: AMPUTATION ABOVE KNEE;  Surgeon: Nada Libman, MD;  Location: [facility] OR;  Service: Vascular;  Laterality: Left;   APPLICATION OF WOUND VAC Left 04/13/2016   Procedure: APPLICATION OF WOUND VAC;  Surgeon: Fransisco Hertz, MD;  Location: [facility] OR;  Service: Vascular;  Laterality: Left;   APPLICATION OF WOUND VAC Left 04/18/2016   Procedure: APPLICATION OF WOUND VAC;  Surgeon: Maeola Harman, MD;  Location: [facility] OR;  Service: Vascular;  Laterality: Left;  Wound vac change    APPLICATION OF WOUND VAC Left 04/20/2016   Procedure: WOUND VAC CHANGE;  Surgeon: Fransisco Hertz, MD;  Location: [facility] OR;  Service: Vascular;  Laterality: Left;   AV FISTULA PLACEMENT     BASCILIC VEIN TRANSPOSITION Left 12/09/2015   Procedure: FIRST STAGE BASILIC VEIN  TRANSPOSITION LEFT UPPER ARM;  Surgeon: Fransisco Hertz, MD;  Location: [facility] OR;  Service: Vascular;  Laterality: Left;   BASCILIC VEIN TRANSPOSITION Left 03/09/2016   Procedure: SECOND STAGE BASILIC VEIN TRANSPOSITION WITH REVISION OF ANASTOMOSIS LEFT UPPER ARM;  Surgeon: Fransisco Hertz, MD;  Location: [facility] OR;  Service: Vascular;  Laterality: Left;   CARDIOVERSION     CARDIOVERSION N/A 01/04/2019   Procedure: CARDIOVERSION;  Surgeon: Lewayne Bunting, MD;  Location: [facility] ENDOSCOPY;  Service: Cardiovascular;  Laterality: N/A;   CHEST TUBE INSERTION Left 11/02/2022   Procedure: CHEST TUBE INSERTION;  Surgeon: Lynnell Catalan, MD;  Location: [facility] ENDOSCOPY;  Service: Pulmonary;  Laterality: Left;  Please have both Pigtail (COOK) and chest tube tray available   REPAIR OF ACUTE ASCENDING THORACIC AORTIC DISSECTION     REVISON OF ARTERIOVENOUS FISTULA Left 04/20/2016   Procedure: LIGATION OF BASILIC VEIN TRANSPOSITION;  Surgeon: Fransisco Hertz, MD;  Location: [facility] OR;  Service: Vascular;  Laterality: Left;   WOUND DEBRIDEMENT Left 04/13/2016   Procedure: DEBRIDEMENT WOUND;  Surgeon: Fransisco Hertz, MD;  Location: [facility] OR;  Service: Vascular;  Laterality: Left;   Social History:  reports that he has never smoked. He has never used smokeless tobacco. He reports that he does not drink alcohol and does not use drugs.  Family / Support Systems Marital Status: Divorced How Long?: 15 years Patient Roles: Parent Spouse/Significant Other:  Divorced Children: two adult dtrs- [name] (lives in [place] [phone]), and Inez Pilgrim- lives in [place]. Other Supports: brother [name] Anticipated Caregiver: TBD Ability/Limitations of Caregiver: Pt reports that he may d/c to home with his dtr [name] who lives in [place]. If pt remains in [place], pt will d/c to home with his brother [name] who cannot provide any physical assistance. 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 Education: high school grad Health Literacy - How often do you need to have someone help you when you read instructions, pamphlets, or other written material from your doctor or pharmacy?: Never Writes: Yes Employment Status: Disabled Date Retired/Disabled/Unemployed: 2004 Legal History/Current Legal Issues: Denies Guardian/Conservator: N/A. Nothing in place. SW   Abuse/Neglect Abuse/Neglect Assessment Can Be Completed: Yes Physical Abuse: Denies Verbal Abuse: Denies Sexual Abuse: Denies Exploitation of patient/patient's resources: Denies Self-Neglect: Denies  Patient response to: Social Isolation - How often do you feel lonely or isolated from those around you?: Never  Emotional Status Pt's affect, behavior and adjustment status: Pt in good spirits at time of visit. Pt trying to figure out discharge plan. Likely discharging to [place]. Recent Psychosocial Issues: Denies Psychiatric History: Denies Substance Abuse History: Denies  Patient / Family Perceptions, Expectations & Goals Pt/Family understanding of illness & functional limitations: Pt has general understanding of pt care needs Premorbid pt/family roles/activities: Independent Anticipated changes in roles/activities/participation: Assistance with ADLs/IADLs Pt/family expectations/goals: Pt goal is to work on strength and stamina to be as independent as possible  Building surveyor: None Premorbid Home Care/DME Agencies: None Transportation available at discharge: TBD Is the patient able to respond to transportation needs?: Yes In the past 12 months, has lack of transportation kept you from medical appointments or from getting medications?: No In the past 12 months, has lack of transportation kept you from meetings, work, or  from getting things needed for daily living?: No Resource referrals recommended: Neuropsychology  Discharge Planning Living Arrangements: Other relatives Support Systems: Other relatives Type of Residence: Private residence Insurance Resources: Media planner (specify), Medicaid (specify county) (UHC Medicare; [place] DSS) Financial Resources: [employer] Financial Screen Referred: No Living Expenses: Psychologist, sport and exercise Management: Patient Does the patient have any problems obtaining your medications?: No Home Management: Pt reports he and his brother manage home care needs Patient/Family Preliminary Plans: TBD Care Coordinator Barriers to Discharge: Decreased caregiver support, Lack of/limited family support, Insurance for SNF coverage, Hemodialysis Care Coordinator Anticipated Follow Up Needs: HH/OP Expected length of stay: D/c 9/8  Clinical Impression This SW covering for primary SW, [employer].   SW met with pt in room to introduce self, explain role, and discuss discharge process. Pt is not a Cytogeneticist. No HCPOA. DME: crutches, and rollator.  Gretchen Short 12/02/2022, 9:54 AM

## 2022-12-02 NOTE — Progress Notes (Signed)
PROGRESS NOTE   Subjective/Complaints: No new complaints this morning Has not found benefit from amitritpyline in helping his sleep or phantom limb pain- will decrease dose and wean off  ROS: +penile pain, +residual limb pain, +phantom limb pain   Objective:   No results found. Recent Labs    12/01/22 0437  WBC 9.0  HGB 8.3*  HCT 28.9*  PLT 231   Recent Labs    12/01/22 0437  NA 139  K 3.8  CL 99  CO2 25  GLUCOSE 91  BUN 35*  CREATININE 5.42*  CALCIUM 8.1*    Intake/Output Summary (Last 24 hours) at 12/02/2022 0914 Last data filed at 12/01/2022 1755 Gross per 24 hour  Intake --  Output 2800 ml  Net -2800 ml        Physical Exam: Vital Signs Blood pressure 110/72, pulse 74, temperature 98.7 F (37.1 C), resp. rate 17, weight 50 kg, SpO2 100%. Gen: no distress, normal appearing, BMI is 13.78 Eyes:     General:        Right eye: No discharge.        Left eye: No discharge.     Comments: Injected. Does not make eye contact but looks in general direction.  Says can see shapes, but nothing clear  Cardiovascular:     Rate and Rhythm: Normal rate and regular rhythm.     Heart sounds: Normal heart sounds. No murmur heard.    No gallop.  Pulmonary:     Effort: Pulmonary effort is normal. No respiratory distress.     Breath sounds: No wheezing, rhonchi or rales.     Comments: Decreased at bases R>L even though chest tube was on L side Abdominal:     General: Abdomen is flat. There is no distension.     Palpations: Abdomen is soft.     Tenderness: There is no abdominal tenderness.  Musculoskeletal:        General: No swelling or tenderness.     Cervical back: Neck supple. No tenderness.     Comments: Amputation site with staples in place and pillow under AKA with multiple dried areas of serous drainage. Right foot with overgrown toe nails.   Ues 5-/5 B/L RLE- HF 4-/5; otherwise DF 0/5 and PF and KE  2-/5 LLE- L AKA- with HF 4-/5   Skin:    General: Skin is warm and dry.     Comments: Boggy area infra clavicular area--non tender and reported to be stable. Multiple grafts RUE--non functional. R-internal jugular noted. RLE with areas of discoloration and stasis changes.  R HD catheter looks OK 2 IV"s LUE- look OK L aka has staples intact- dog ears notable, not shaping so far Not significant edema, however Leg/ L AKA- tip elevated on pillow- said per Surgeon  Neurological:     Mental Status: He is alert and oriented to person, place, and time.     Comments: Decreased to absent below R knee  Psychiatric:        Mood and Affect: Mood normal.        Behavior: Behavior normal.   Assessment/Plan: 1. Functional deficits which require 3+ hours per day  of interdisciplinary therapy in a comprehensive inpatient rehab setting. Physiatrist is providing close team supervision and 24 hour management of active medical problems listed below. Physiatrist and rehab team continue to assess barriers to discharge/monitor patient progress toward functional and medical goals  Care Tool:  Bathing    Body parts bathed by patient: Right arm, Left arm, Chest, Abdomen, Front perineal area, Buttocks, Right upper leg, Left upper leg, Face, Right lower leg     Body parts n/a: Left lower leg   Bathing assist Assist Level: Minimal Assistance - Patient > 75%     Upper Body Dressing/Undressing Upper body dressing   What is the patient wearing?: Pull over shirt    Upper body assist Assist Level: Minimal Assistance - Patient > 75%    Lower Body Dressing/Undressing Lower body dressing      What is the patient wearing?: Incontinence brief, Pants     Lower body assist Assist for lower body dressing: Moderate Assistance - Patient 50 - 74%     Toileting Toileting    Toileting assist Assist for toileting: Moderate Assistance - Patient 50 - 74%     Transfers Chair/bed transfer  Transfers assist      Chair/bed transfer assist level: Contact Guard/Touching assist (slideboard)     Locomotion Ambulation   Ambulation assist   Ambulation activity did not occur: Safety/medical concerns (pain, fear of falling, decreased balance, protection of contralateral limb)          Walk 10 feet activity   Assist  Walk 10 feet activity did not occur: Safety/medical concerns (pain, fear of falling, decreased balance, protection of contralateral limb)        Walk 50 feet activity   Assist Walk 50 feet with 2 turns activity did not occur: Safety/medical concerns (pain, fear of falling, decreased balance, protection of contralateral limb)         Walk 150 feet activity   Assist Walk 150 feet activity did not occur: Safety/medical concerns (pain, fear of falling, decreased balance, protection of contralateral limb)         Walk 10 feet on uneven surface  activity   Assist Walk 10 feet on uneven surfaces activity did not occur: Safety/medical concerns (pain, fear of falling, decreased balance, protection of contralateral limb)         Wheelchair     Assist Is the patient using a wheelchair?: Yes Type of Wheelchair: Manual Wheelchair activity did not occur: Safety/medical concerns (pain)  Wheelchair assist level: Supervision/Verbal cueing Max wheelchair distance: 134ft    Wheelchair 50 feet with 2 turns activity    Assist    Wheelchair 50 feet with 2 turns activity did not occur: Safety/medical concerns (pain)   Assist Level: Supervision/Verbal cueing   Wheelchair 150 feet activity     Assist  Wheelchair 150 feet activity did not occur: Safety/medical concerns (pain)       Blood pressure 110/72, pulse 74, temperature 98.7 F (37.1 C), resp. rate 17, weight 50 kg, SpO2 100%.  Medical Problem List and Plan: 1. Functional deficits secondary to  L AKA secondary to left foot wound 2/2 PAD             -patient may not shower due to R HD catheter              -ELOS/Goals: 12-14 days supervision Continue CIR   2.  Antithrombotics: -DVT/anticoagulation:  Pharmaceutical: Eliquis. IVC filter in place.              -  antiplatelet therapy: N/A 3. Penile pain: topical bacitracin ordered  4. Insomnia: decrease amitriptyline to 10mg  since not helping with his sleep and cannot increase further given cardiac history  5. Neuropsych/cognition: This patient is capable of making decisions on his own behalf. 6. Skin/Wound Care: Routine pressure relief measure 7. Fluids/Electrolytes/Nutrition:  Strict I/O. Daily weights. Diet changed to Renal per input from nephrology. 8. L-AKA: Monitor incision for healing. Dry dressing. Shrinkers ordered  9. ESRD: Renal diet with 1200 cc FR/HD TTS at the end of the day to help with tolerance of therapy.  10. H/o AAA aneurysm rupture s/p repair:  with resultant paraplegia, sensory deficits from saddle down and cauda equina syndrome             --was able to walk till a few years ago when left knee gave out.              --has severe LLQ pain prior to defecation. Continue Senna/miralax--discussed suppository every other day but patient wants to think on it "that's too much" 11. Anemia of chronic disease:] 12. L- PNA and parapneumonic effusion s/p CT: Has completed 2 week course of antibiotics.  13. Fluid overload: Continue to monitor for symptoms and wean oxygen as tolerated.  14. Neurogenic bowel: Will monitor for now.    15. Neuropathic pain- will change gabapentin to 300 mg at bedtime, decrease amitriptyline to 10mg  since not helping with sleep and we cannot increase further due to cardiac history  16. Leukocytosis: WBC reviewed and has normalized  17. Underweight: provide dietary education  18. Suboptimal vitamin D: will not start supplement due to allergy to doxecalciferol. Will order grounds pass so he can have sunlight exposure.   19. Chest pain, chronic: renal is aware, continue ultrafiltration with  dialysis  I spent >72mins performing patient care related activities, including face to face time, documentation time, med management, discussion of meds and lab orders with patient, and overall coordination of care.      LOS: 3 days A FACE TO FACE EVALUATION WAS PERFORMED  Drema Pry Jerrett Baldinger 12/02/2022, 9:14 AM

## 2022-12-02 NOTE — Progress Notes (Signed)
Physical Therapy Session Note  Patient Details  Name: Corey [name] MRN: [id number] Date of Birth: 12-28-1970  Today's Date: 12/02/2022 PT Individual Time: 0930-1011 PT Individual Time Calculation (min): 41 min  Today's Date: 12/02/2022 PT Missed Time: 19 Minutes Missed Time Reason: Pain  Short Term Goals: Week 1:  PT Short Term Goal 1 (Week 1): pt will perform bed mobility with supervision PT Short Term Goal 2 (Week 1): pt will transfer bed<>chair with LRAD and CGA PT Short Term Goal 3 (Week 1): pt will transfer sit<>stand with LRAD and min A  Skilled Therapeutic Interventions/Progress Updates:   Received pt sitting in WC and reported pain 9/10 in L residual limb - RN notified but reported pt up to date on all pain medication. Pt also reported feeling nauseous - provided with ginger ale (cleared by RN). Noted pt not wearing shrinker but had ace wrap on. Educated pt on importance of wearing shrinker to shape limb for prosthetic and for gradual pain relief over time and pt reluctantly agreed.  Doffed/donned pants with max A via WC push ups and lateal leans. Donned 3XL shrinker dependently, but noted gapping and lack of compression at top. Removed shrinker and donned gray AKA shrinker with belt with total A with increased time due to severe increase in pain. Able to flip and begin 2nd layer of compression but unable to pull all the way up due to pain - however, shrinker still providing adequate compression at this time.  Pt reported SOB - SPO2 87% increasing to 95% with cues for pursed lip breathing. Pt politely declined going to gym or participating any further in session due to pain and requested to return to bed. Transferred WC<>bed via slideboard with CGA and assist to place/remove board. Pt transferred sit<>supine with supervision and scooted to [facility] pulling on bedrails without assist. Increased time spent repositioning in bed for comfort. Concluded session with pt semi-reclined in bed, needs  within reach, and bed alarm on. 19 minutes missed of skilled physical therapy due to pain.   Therapy Documentation Precautions:  Precautions Precautions: Fall, Other (comment) Precaution Comments: watch BP; blind in L eye Restrictions Weight Bearing Restrictions: Yes LLE Weight Bearing: Non weight bearing  Therapy/Group: Individual Therapy [name] [name] PT, DPT 12/02/2022, 6:51 AM

## 2022-12-02 NOTE — Progress Notes (Signed)
Occupational Therapy Session Note  Patient Details  Name: Corey [name] MRN: [id number] Date of Birth: [birth year]  Today's Date: 12/02/2022 OT Individual Time: 4098-1191 OT Individual Time Calculation (min): 70 min    Short Term Goals: Week 1:  OT Short Term Goal 1 (Week 1): Pt will complete LB dressing with CGA OT Short Term Goal 2 (Week 1): Pt will report < 5/10 pain for 2 consecutive sessions OT Short Term Goal 3 (Week 1): Pt will complete toileting with CGA  Skilled Therapeutic Interventions/Progress Updates:  Skilled OT intervention completed with focus on functional transfers, O2 management, w/c mobility, BUE strengthening. Pt received slouched down in bed, with family present, pt agreeable to session. 9.5/10 pain reported in Lt residual limb; pt declined meds. OT offered rest breaks and repositioning throughout for pain reduction.  Pt received on RA, eating lunch but noted to be acting spacey. Vitals assessed with BP 87/56, 72% O2 sat with pulse ox. Warmed pt's fingers due to suspected inaccurate reading however confirmed with dynamp and warm fingers. Donned 1L, with pt only increasing to 80%, requiring 2L and several mins to obtain >96% sat. MD notified of status with orders to keep pt above 96% and maintain 2L for now 2/2 status. Pt remained on 2L through and at end of session.  Pt transitioned to EOB with min A for further vital sign check. 5/10 dizziness reported upon bed mobility however BP 106/79. Pt agreeable to trial transfer > w/c due to symptoms not worsening. Total A placement of board, min A transfer on board > w/c > Rt side.   Daughter with questions about steps for pt to get a prosthetic and inquiring if it would be a good idea to have pt move to [facility] with her after DC or wait for the prosthetic. Education provided about requirements to be considered for a prosthetic including general health/stability, integrity/status of the contralateral leg (which is very poor with  suspected AKA soon to happen on it), and several months before considered WB therefore advised wherever pt will have the most support/supervision would be best for pt as well as accessible.   Pt self-propelled in w/c > gym with supervision for navigating 2/2 low vision. Did desat to 79% on 2L with 2 mins needed to rebound. Pt completed the following exercises to promote strength needed for w/c mobility and BADLs (3 lb dumbbell): -bicep curls 2x15, each arm -chest press 2x15, each arm  Transported dependently in w/c back to room. Pt remained seated in w/c, with belt alarm on/activated, and with all needs in reach at end of session.   Therapy Documentation Precautions:  Precautions Precautions: Fall, Other (comment) Precaution Comments: watch BP; blind in L eye Restrictions Weight Bearing Restrictions: Yes LLE Weight Bearing: Non weight bearing    Therapy/Group: Individual Therapy  Melvyn Novas, MS, OTR/L  12/02/2022, 3:36 PM

## 2022-12-02 NOTE — IPOC Note (Signed)
Overall Plan of Care [facility]) Patient Details Name: Corey [name] MRN: [id number] DOB: 1970/07/29  Admitting Diagnosis: S/P AKA (above knee amputation), left [facility])  Hospital Problems: Principal Problem:   S/P AKA (above knee amputation), left (HCC) Active Problems:   ESRD on dialysis (HCC)   Protein-calorie malnutrition, severe   Debility     Functional Problem List: Nursing Bowel, Endurance, Medication Management, Safety, Pain  PT Balance, Edema, Endurance, Motor, Nutrition, Pain, Perception, Sensory, Skin Integrity  OT Balance, Edema, Endurance, Motor, Safety, Pain  SLP    TR         Basic ADL's: OT Bathing, Dressing, Toileting     Advanced  ADL's: OT       Transfers: PT Bed Mobility, Bed to Chair, Car, Occupational psychologist, Research scientist (life sciences): PT Ambulation, Psychologist, prison and probation services, Stairs     Additional Impairments: OT None  SLP        TR      Anticipated Outcomes Item Anticipated Outcome  Self Feeding Set up A  Swallowing      Basic self-care  Supervision/mod I  Engineer, technical sales Transfers Supervision  Bowel/Bladder  manage bowel w mod I assist  Transfers  mod I with LRAD  Locomotion  N/A  Communication     Cognition     Pain  < 4 with prns  Safety/Judgment  manage w cues   Therapy Plan: PT Intensity: Minimum of 1-2 x/day ,45 to 90 minutes PT Frequency: 5 out of 7 days PT Duration Estimated Length of Stay: 10-12 days OT Intensity: Minimum of 1-2 x/day, 45 to 90 minutes OT Frequency: 5 out of 7 days OT Duration/Estimated Length of Stay: 7-10 days     Team Interventions: Nursing Interventions Disease Management/Prevention, Medication Management, Discharge Planning, Bowel Management, Patient/Family Education  PT interventions Ambulation/gait training, Discharge planning, Functional mobility training, Psychosocial support, Therapeutic Activities, Visual/perceptual remediation/compensation, Balance/vestibular training,  Disease management/prevention, Neuromuscular re-education, Skin care/wound management, Therapeutic Exercise, Wheelchair propulsion/positioning, DME/adaptive equipment instruction, Pain management, Splinting/orthotics, UE/LE Strength taining/ROM, Firefighter, Equities trader education, Museum/gallery curator, UE/LE Coordination activities  OT Interventions Warden/ranger, Discharge planning, Pain management, Self Care/advanced ADL retraining, Therapeutic Activities, UE/LE Coordination activities, Disease mangement/prevention, Functional mobility training, Patient/family education, Skin care/wound managment, Therapeutic Exercise, DME/adaptive equipment instruction, Neuromuscular re-education, UE/LE Strength taining/ROM, Wheelchair propulsion/positioning, Psychosocial support  SLP Interventions    TR Interventions    SW/CM Interventions Discharge Planning, Psychosocial Support, Patient/Family Education   Barriers to Discharge MD  Medical stability  Nursing Decreased caregiver support, Hemodialysis, Wound Care 1 level ramped entry w brother, has w/c and 3N1  PT Decreased caregiver support, Neurogenic Bowel & Bladder, Wound Care, Hemodialysis, Weight bearing restrictions, Other (comments) pain, HD Tu/Th/Sat, lives with brother who is unable to assist physically, and NWB LLE.  OT Home environment access/layout, Decreased caregiver support, Wound Care, Hemodialysis, Weight bearing restrictions, New oxygen    SLP      SW       Team Discharge Planning: Destination: PT-Home ,OT- Home , SLP-  Projected Follow-up: PT-Home health PT, OT-  24 hour supervision/assistance, Home health OT, SLP-  Projected Equipment Needs: PT-To be determined, OT- To be determined, SLP-  Equipment Details: PT- , OT-  Patient/family involved in discharge planning: PT- Patient,  OT-Patient, SLP-   MD ELOS: 12 days Medical Rehab Prognosis:  Excellent Assessment: The patient has been admitted for CIR therapies  with the diagnosis of left AKA.The team will be addressing functional  mobility, strength, stamina, balance, safety, adaptive techniques and equipment, self-care, bowel and bladder mgt, patient and caregiver education. Goals have been set at modI/S. Anticipated discharge destination is home.        See Team Conference Notes for weekly updates to the plan of care

## 2022-12-03 LAB — CBC
HCT: 31.5 % — ABNORMAL LOW (ref 39.0–52.0)
Hemoglobin: 9 g/dL — ABNORMAL LOW (ref 13.0–17.0)
MCH: 28 pg (ref 26.0–34.0)
MCHC: 28.6 g/dL — ABNORMAL LOW (ref 30.0–36.0)
MCV: 98.1 fL (ref 80.0–100.0)
Platelets: 206 10*3/uL (ref 150–400)
RBC: 3.21 MIL/uL — ABNORMAL LOW (ref 4.22–5.81)
RDW: 18.5 % — ABNORMAL HIGH (ref 11.5–15.5)
WBC: 9.8 10*3/uL (ref 4.0–10.5)
nRBC: 0 % (ref 0.0–0.2)

## 2022-12-03 LAB — RENAL FUNCTION PANEL
Albumin: 2.9 g/dL — ABNORMAL LOW (ref 3.5–5.0)
Anion gap: 12 (ref 5–15)
BUN: 40 mg/dL — ABNORMAL HIGH (ref 6–20)
CO2: 27 mmol/L (ref 22–32)
Calcium: 8.6 mg/dL — ABNORMAL LOW (ref 8.9–10.3)
Chloride: 101 mmol/L (ref 98–111)
Creatinine, Ser: 5.46 mg/dL — ABNORMAL HIGH (ref 0.61–1.24)
GFR, Estimated: 12 mL/min — ABNORMAL LOW (ref >60)
Glucose, Bld: 93 mg/dL (ref 70–99)
Phosphorus: 3.3 mg/dL (ref 2.5–4.6)
Potassium: 4.6 mmol/L (ref 3.5–5.1)
Sodium: 140 mmol/L (ref 135–145)

## 2022-12-03 MED ORDER — LIDOCAINE-PRILOCAINE 2.5-2.5 % EX CREA
1.0000 | TOPICAL_CREAM | CUTANEOUS | Status: DC | PRN
  Filled 2022-12-03: qty 5

## 2022-12-03 MED ORDER — LIDOCAINE HCL (PF) 1 % IJ SOLN: 5.0000 mL | INTRAMUSCULAR | Status: DC | PRN

## 2022-12-03 MED ORDER — ALTEPLASE 2 MG IJ SOLR
2.0000 mg | Freq: Once | INTRAMUSCULAR | Status: DC | PRN
  Filled 2022-12-03: qty 2

## 2022-12-03 MED ORDER — ANTICOAGULANT SODIUM CITRATE 4% (200MG/5ML) IV SOLN
5.0000 mL | Status: DC | PRN
  Administered 2022-12-03: 3.8 mL
  Filled 2022-12-03 (×4): qty 5

## 2022-12-03 MED ORDER — AMITRIPTYLINE HCL 10 MG PO TABS
10.0000 mg | ORAL_TABLET | Freq: Every day | ORAL | Status: DC
  Administered 2022-12-04: 10 mg via ORAL
  Filled 2022-12-03 (×2): qty 1

## 2022-12-03 MED ORDER — PENTAFLUOROPROP-TETRAFLUOROETH EX AERO: 1.0000 | INHALATION_SPRAY | CUTANEOUS | Status: DC | PRN

## 2022-12-03 MED ORDER — AMITRIPTYLINE HCL 25 MG PO TABS: 25.0000 mg | ORAL_TABLET | Freq: Every day | ORAL | Status: DC

## 2022-12-03 MED ORDER — ALBUMIN HUMAN 25 % IV SOLN
25.0000 g | Freq: Once | INTRAVENOUS | Status: AC
  Administered 2022-12-03: 25 g via INTRAVENOUS
  Filled 2022-12-03: qty 100

## 2022-12-03 NOTE — Progress Notes (Signed)
PROGRESS NOTE   Subjective/Complaints: No new complaints this morning Complains of mostly phantom limb pains, keeping him up at nighttime, making it difficult for him to sleep.  Has been working on manual massage and other tactile cues with therapy to reduce symptoms.  Does also endorse some aching pain secondary to edema, but is tolerating current Ace wrap better than compression sleeve.  No complaints of penile pain today.  ROS: +penile pain, +residual limb pain, +phantom limb pain.    Objective:   No results found. Recent Labs    12/01/22 0437 12/03/22 1139  WBC 9.0 9.8  HGB 8.3* 9.0*  HCT 28.9* 31.5*  PLT 231 206   Recent Labs    12/01/22 0437 12/03/22 1139  NA 139 140  K 3.8 4.6  CL 99 101  CO2 25 27  GLUCOSE 91 93  BUN 35* 40*  CREATININE 5.42* 5.46*  CALCIUM 8.1* 8.6*    Intake/Output Summary (Last 24 hours) at 12/03/2022 2319 Last data filed at 12/03/2022 1719 Gross per 24 hour  Intake 120 ml  Output 1300 ml  Net -1180 ml        Physical Exam: Vital Signs Blood pressure 105/65, pulse 71, temperature 98.4 F (36.9 C), resp. rate 20, weight 54 kg, SpO2 98%. Gen: no distress, normal appearing, BMI is 13.78 Eyes: Pupils equal and round.  Looks in general direction.  Says can see shapes, but nothing clear  Cardiovascular:     Rate and Rhythm: Normal rate and regular rhythm.     Heart sounds: Normal heart sounds. No murmur heard.    No gallop.  Pulmonary:     Effort: Pulmonary effort is normal. No respiratory distress.     Breath sounds: No wheezing, rhonchi or rales.     Comments: Decreased at bases, otherwise clear to auscultation. Abdominal:     General: Abdomen is flat. There is no distension.     Palpations: Abdomen is soft.     Tenderness: There is no abdominal tenderness.  Musculoskeletal:        General: No swelling or tenderness.     Cervical back: Neck supple. No tenderness.      Comments: Amputation site well wrapped in figure-of-eight for Ace bandage, no drainage on dressing.  No tenderness to palpation, good amount of compression.    Right foot with overgrown toe nails.   Ues 5-/5 B/L RLE- HF 4-/5; otherwise DF 0/5 and PF and KE 2-/5 LLE- L AKA- with HF 4-/5   Skin:     General: Skin is warm and dry.     Comments: Boggy area infra clavicular area--non tender and reported to be stable. Multiple grafts RUE--non functional. R-internal jugular noted. RLE with areas of discoloration and stasis changes.  R HD catheter looks OK 2 IV"s LUE- look OK L aka has staples intact- dog ears notable, not shaping so far Not significant edema, however Leg/ L AKA- tip elevated on pillow- said per Surgeon  Neurological:     Mental Status: He is alert and oriented to person, place, and time.     Comments: Decreased to absent below R knee  Psychiatric:  Mood and Affect: Mood normal.        Behavior: Behavior normal.   Assessment/Plan: 1. Functional deficits which require 3+ hours per day of interdisciplinary therapy in a comprehensive inpatient rehab setting. Physiatrist is providing close team supervision and 24 hour management of active medical problems listed below. Physiatrist and rehab team continue to assess barriers to discharge/monitor patient progress toward functional and medical goals  Care Tool:  Bathing    Body parts bathed by patient: Right arm, Left arm, Chest, Abdomen, Front perineal area, Buttocks, Right upper leg, Left upper leg, Face, Right lower leg     Body parts n/a: Left lower leg   Bathing assist Assist Level: Supervision/Verbal cueing     Upper Body Dressing/Undressing Upper body dressing   What is the patient wearing?: Pull over shirt    Upper body assist Assist Level: Minimal Assistance - Patient > 75%    Lower Body Dressing/Undressing Lower body dressing      What is the patient wearing?: Incontinence brief, Pants, Ace wrap/stump  shrinker     Lower body assist Assist for lower body dressing: Moderate Assistance - Patient 50 - 74%     Toileting Toileting    Toileting assist Assist for toileting: Moderate Assistance - Patient 50 - 74%     Transfers Chair/bed transfer  Transfers assist  Chair/bed transfer activity did not occur: Safety/medical concerns  Chair/bed transfer assist level: Contact Guard/Touching assist (slideboard)     Locomotion Ambulation   Ambulation assist   Ambulation activity did not occur: Safety/medical concerns (pain, fear of falling, decreased balance, protection of contralateral limb)          Walk 10 feet activity   Assist  Walk 10 feet activity did not occur: Safety/medical concerns (pain, fear of falling, decreased balance, protection of contralateral limb)        Walk 50 feet activity   Assist Walk 50 feet with 2 turns activity did not occur: Safety/medical concerns (pain, fear of falling, decreased balance, protection of contralateral limb)         Walk 150 feet activity   Assist Walk 150 feet activity did not occur: Safety/medical concerns (pain, fear of falling, decreased balance, protection of contralateral limb)         Walk 10 feet on uneven surface  activity   Assist Walk 10 feet on uneven surfaces activity did not occur: Safety/medical concerns (pain, fear of falling, decreased balance, protection of contralateral limb)         Wheelchair     Assist Is the patient using a wheelchair?: Yes Type of Wheelchair: Manual Wheelchair activity did not occur: Safety/medical concerns (pain)  Wheelchair assist level: Supervision/Verbal cueing Max wheelchair distance: 132ft    Wheelchair 50 feet with 2 turns activity    Assist    Wheelchair 50 feet with 2 turns activity did not occur: Safety/medical concerns (pain)   Assist Level: Supervision/Verbal cueing   Wheelchair 150 feet activity     Assist  Wheelchair 150 feet  activity did not occur: Safety/medical concerns (pain)       Blood pressure 105/65, pulse 71, temperature 98.4 F (36.9 C), resp. rate 20, weight 54 kg, SpO2 98%.  Medical Problem List and Plan: 1. Functional deficits secondary to  L AKA secondary to left foot wound 2/2 PAD             -patient may not shower due to R HD catheter             -  ELOS/Goals: 12-14 days supervision Continue CIR   2.  Antithrombotics: -DVT/anticoagulation:  Pharmaceutical: Eliquis. IVC filter in place.              -antiplatelet therapy: N/A 3. Penile pain: topical bacitracin ordered  4. Insomnia complicated by phantom pain: decrease amitriptyline to 10mg  since not helping with his sleep and cannot increase further given cardiac history  -Continues to be limited by phantom pain with difficulty sleeping; already on max dose gabapentin for renal function and with reducing amitriptyline as above.  Could trial transition to Lyrica 25 mg; will defer this to primary team.  5. Neuropsych/cognition: This patient is capable of making decisions on his own behalf. 6. Skin/Wound Care: Routine pressure relief measure 7. Fluids/Electrolytes/Nutrition:  Strict I/O. Daily weights. Diet changed to Renal per input from nephrology. 8. L-AKA: Monitor incision for healing. Dry dressing. Shrinkers ordered  -8-31: Per patient, not tolerating sugars, but tolerating figure-of-eight ace wrap.  Continue.  9. ESRD: Renal diet with 1200 cc FR/HD TTS at the end of the day to help with tolerance of therapy.  10. H/o AAA aneurysm rupture s/p repair:  with resultant paraplegia, sensory deficits from saddle down and cauda equina syndrome             --was able to walk till a few years ago when left knee gave out.              --has severe LLQ pain prior to defecation. Continue Senna/miralax--discussed suppository every other day but patient wants to think on it "that's too much"  11. Anemia of chronic disease:] 12. L- PNA and  parapneumonic effusion s/p CT: Has completed 2 week course of antibiotics.  13. Fluid overload: Continue to monitor for symptoms and wean oxygen as tolerated.  14. Neurogenic bowel: Will monitor for now.  Last bowel movement 8-31    15. Neuropathic pain- will change gabapentin to 300 mg at bedtime, decrease amitriptyline to 10mg  since not helping with sleep and we cannot increase further due to cardiac history  - As above, consider transition to Lyrica, defer to primary team.  If chronic pain management needed, could also consider Butrans patch as this remains stable through dialysis.  16. Leukocytosis: WBC reviewed and has normalized  17. Underweight: provide dietary education  18. Suboptimal vitamin D: will not start supplement due to allergy to doxecalciferol. Will order grounds pass so he can have sunlight exposure.   19. Chest pain, chronic: renal is aware, continue ultrafiltration with dialysis    LOS: 4 days A FACE TO FACE EVALUATION WAS PERFORMED  Corey [name] 12/03/2022, 11:19 PM

## 2022-12-03 NOTE — Plan of Care (Signed)
  Problem: RH BOWEL ELIMINATION Goal: RH STG MANAGE BOWEL WITH ASSISTANCE Description STG Manage Bowel with Assistance. Outcome: Progressing   Problem: RH SKIN INTEGRITY Goal: RH STG SKIN FREE OF INFECTION/BREAKDOWN Outcome: Progressing

## 2022-12-03 NOTE — Progress Notes (Signed)
Occupational Therapy Session Note  Patient Details  Name: Corey [name] MRN: [id number] Date of Birth: 20-Oct-1970  Today's Date: 12/03/2022 OT Individual Time: 0865-7846 OT Individual Time Calculation (min): 70 min    Short Term Goals: Week 1:  OT Short Term Goal 1 (Week 1): Pt will complete LB dressing with CGA OT Short Term Goal 2 (Week 1): Pt will report < 5/10 pain for 2 consecutive sessions OT Short Term Goal 3 (Week 1): Pt will complete toileting with CGA  Skilled Therapeutic Interventions/Progress Updates:  Skilled OT intervention completed with focus on ADL retraining, functional transfers, residual limb education. Pt received supine in bed, agreeable to session. 8/10 pain reported in Lt residual limb; pre-medicated. OT offered rest breaks and repositioning throughout for pain reduction.  Reported not sleeping well with preference to sleep, but agreeable to freshen up and change clothes. Doffed pants with min A in bed with rolls R<>L with mod I. Lt AKA wrap noted to be 1/2 way off therefore OT removed. Pt not agreeable to shrinker wear as it caused a lot of pain yesterday. Re-education provided on importance of wear for edema and shaping (per his LTG of a prosthesis). Pt agreeable to changing out ACE wrap for wound coverage and integrity. Non-adherent pad applied to middle of incision due to small opening with drainage, then ACE in a figure 8 for even pressure. Washed periarea with set up A, then donned underwear/pants with min A for threading over RLE due to difficulty and SOB. Pt required extended breaks for fatigue and dyspnea with time needed also to rebound O2 sats (see below).  Transitioned to EOB with supervision; denied dizziness. Mod A for board placement, Min A fading to close supervision transfer on board > w/c. OT fixed arm rest of w/c to allow smooth glide for self-propulsion.   Seated at sink, pt completed oral care, UB bathing/dressing with supervision with cues needed to  redonn O2 [place] after removal. Education provided on low vision strategies such as feeling caps to distinguish differences in types/purposes. Pt remained seated in w/c with nurse present/notified of status with O2. Belt alarm on/activated, and with all needs in reach at end of session.  Vitals -Received on 2.5L via , SPO2 86% rebounded to 93% with sitting upright and diaphragmatic breathing -97% sitting EOB, titrated to 1L -80% with O2 off for UB dressing, but also with activity with 1L; needed extended breathing breaks and 2.5 L to maintain 96% or above per order    Therapy Documentation Precautions:  Precautions Precautions: Fall, Other (comment) Precaution Comments: watch BP; blind in L eye Restrictions Weight Bearing Restrictions: Yes LLE Weight Bearing: Non weight bearing    Therapy/Group: Individual Therapy  Melvyn Novas, MS, OTR/L  12/03/2022, 10:54 AM

## 2022-12-03 NOTE — Progress Notes (Signed)
Physical Therapy Session Note  Patient Details  Name: Corey [name] MRN: [id number] Date of Birth: [birth year]  Today's Date: 12/03/2022 PT Individual Time: 8295-6213 PT Individual Time Calculation (min): 70 min   Short Term Goals: Week 1:  PT Short Term Goal 1 (Week 1): pt will perform bed mobility with supervision PT Short Term Goal 2 (Week 1): pt will transfer bed<>chair with LRAD and CGA PT Short Term Goal 3 (Week 1): pt will transfer sit<>stand with LRAD and min A  Skilled Therapeutic Interventions/Progress Updates:    Chart reviewed and pt agreeable to therapy. Pt received seated in WC with no c/o pain. Also of note, pt on 3L O2 via [place]. Pt weaned to RA with SpO2 97%. Session focused on WC mobility and O2 management. Pt initiated session with 353ft of WC propulsion using S. Pt SpO2 checked and found to be 88%. Pt instructed on PLB and required strong VC to appropriately demonstrate. SpO2 raised to 95% in less than 1 min with PLB. Pt then completed WC mobility around objects with S + VC for visual guidance as needed. Pt also completed  2x10 WC pushups with ability to lift hips but not clear sits bones. Pt SpO2 checked again with same reading as prior check. Pt again practice PLB with VC needed for correct sequencing. Pt SpO2 noted to remain around 86-88% after 3 mins of PLB, so pt returned to 1L O2 via [place]. Pt then completed WC propulsion to room. In room, lab technician entered for <5 mins for blood work. Pt then completed 3x10 shoulder press with 3# bar. Pt also instructed on AROM shoulder press OH with no bar in hands to practice between sessions and promote ROM. At end of session, pt was left seated in recliner with alarm engaged, nurse call bell and all needs in reach.     Therapy Documentation Precautions:  Precautions Precautions: Fall, Other (comment) Precaution Comments: watch BP; blind in L eye Restrictions Weight Bearing Restrictions: Yes LLE Weight Bearing: Non weight  bearing General:      Therapy/Group: Individual Therapy  Dionne Milo, PT, DPT 12/03/2022, 12:07 PM

## 2022-12-03 NOTE — Progress Notes (Signed)
   12/03/22 1719  Vitals  Temp 97.8 F (36.6 C)  Pulse Rate 68  Resp (!) 26  BP 107/70  SpO2 95 %  O2 Device Nasal Cannula  Weight  (unable to weigh)  Type of Weight Post-Dialysis  Oxygen Therapy  O2 Flow Rate (L/min) 2 L/min  Patient Activity (if Appropriate) In bed  Pulse Oximetry Type Continuous  Oximetry Probe Site Changed No  Post Treatment  Dialyzer Clearance Other (Comment) (very dark--clotting off)  Hemodialysis Intake (mL) 100 mL  Liters Processed 38.4  Fluid Removed (mL) 1300 mL  Tolerated HD Treatment  (see above)  Post-Hemodialysis Comments see above  During Treatment Monitoring  Duration of HD Treatment -hour(s) 1.59 hour(s)   Received patient in bed to unit.  Alert and oriented.  Informed consent signed and in chart.   TX duration:1 hour 55 minutes---pt opted not to be put back on tx when he started to clot off--signed the AMA form--Dr. Kathrene Bongo made aware--no new orders at this time  Patient tolerated well.  Transported back to the room  Alert, without acute distress.  Hand-off given to patient's nurse.   Access used: [facility] Access issues: lines reversed  Total UF removed: 1200 Medication(s) given: albumin 25% IV x 1 midrun--atarax 25mg  po x 1--mididrine 15mg  po x 1 [facility]

## 2022-12-03 NOTE — Progress Notes (Signed)
[facility] NEPHROLOGY PROGRESS NOTE  Subjective:  Last HD on 8/29 with 2.8 kg UF.  Hasn't yet been called to HD today.  Floor is trying to wean oxygen as able.  Short of breath on room air with activity earlier today and high 80's per therapy.   Review of systems:    A bit short of breath at times but thinks breathing today and over time No chest pain Denies n/v   Objective Vital signs in last 24 hours:    12/03/2022    5:01 AM 12/02/2022    7:47 PM 12/02/2022    1:28 PM  Vitals with BMI  Weight 119 lbs 1 oz    BMI 14.88    Systolic 106 108 87  Diastolic 70 71 58  Pulse 64 69 74     Physical Exam:       General adult male in chair in no acute distress HEENT normocephalic atraumatic extraocular movements intact sclera anicteric Neck supple trachea midline Lungs clear to auscultation bilaterally; reduced breath sounds right lung; normal work of breathing at rest on 1 liter Heart S1S2 no rub Abdomen soft nontender nondistended Extremities trace to 1+ edema; left AKA  Psych normal mood and affect Neuro - alert and oriented x 3 provides hx and follows commands; visually impaired Access RIJ tunn catheter in place; failed accesses bilateral upper extremities       OP HD: TTS [facility] 4h  2/2 bath  66kg   TDC   no heparin (allergy)  lock TDC w/ citrate - No ESA or VDRA - Binder: Renvela pwdr 4.8g TID - Sensipar 180mg  every day at home    Assessment/ Plan  # Acute hypoxic respiratory failure: In setting of missed HD, pna w/ effusion. CXR with pleural effusion s/p chest tube 7/31 now removed.  Improved. Optimize volume status with HD   # PNA/ L parapneumonic pleural effusion: s/p thoracentesis with cultures. S/p IV abx course and finished with augmentin  #Left leg wound - s/p AKA 8/15  # ESRD: continue HD per TTS schedule.  UF as able with support of albumin.  Hypotension limits UF.  - note that he will need a lower EDW given the amputation.  Listed as 50 kg  on 12/02/22 AM (the morning after HD).  Outpatient would try 50 kg as EDW and continue to challenge as needed - fluid restrictions are in place  #Heparin allergy - use citrate for cath lock.  #Hypotension/ volume:  He is on midodrine. UF as tolerated with HD   #Anemia of ESRD: have increased aranesp to 100 mcg every Sat for the 8/31 dose onward.  Anticipate need to further increase  # Secondary HPTH: switched to sevelamer per pharm recs.  At one point cinacalcet was held due to hypocalcemia but then resumed at 90 mg daily (half of his normal dose).  Stopped cinacalcet again on 8/26 due to hypocalcemia.  # Hypocalcemia - see above - holding sensipar.  Improved after stopping sensipar.  May be able to start back at a lower dose if needed soon.  #A-fib: On amiodarone and eliquis - per primary team; has IVC filter  # H/o type B aortic dissection s/p repair (2004)   Dispo - I have spoken to SW about getting him a new outpatient HD unit.  He plans to relocate to [place], [place] after discharge.  Need to confirm new unit in place prior to discharge    Recent Labs  Lab 11/28/22 0900 11/29/22  3086 12/01/22 0437  HGB 8.4* 8.1* 8.3*  ALBUMIN 2.4*  --  2.6*  CALCIUM 6.6* 6.6* 8.1*  PHOS 2.0* 2.0* 2.5  CREATININE 5.19* 5.76* 5.42*  K 3.6 4.3 3.8    Inpatient medications:  acetaminophen  650 mg Oral TID WC & HS   amiodarone  200 mg Oral Daily   amitriptyline  10 mg Oral QHS   apixaban  2.5 mg Oral BID   bacitracin   Topical BID   Chlorhexidine Gluconate Cloth  6 each Topical Q12H   Chlorhexidine Gluconate Cloth  6 each Topical Q0600   darbepoetin (ARANESP) injection - DIALYSIS  100 mcg Subcutaneous Q Sat-1800   docusate sodium  100 mg Oral Daily   feeding supplement  237 mL Oral BID BM   gabapentin  300 mg Oral QHS   hydrocerin   Topical Daily   lidocaine  3 patch Transdermal Q24H   lidocaine   Topical TID   midodrine  15 mg Oral TID WC   multivitamin  1 tablet Oral QHS    polyethylene glycol  17 g Oral BID   senna-docusate  1 tablet Oral BID   sevelamer carbonate  4.8 g Oral TID WC      acetaminophen, bisacodyl, calcium carbonate (dosed in mg elemental calcium), camphor-menthol **AND** hydrOXYzine, diphenhydrAMINE, guaiFENesin-dextromethorphan, methocarbamol, milk and molasses, phenol, prochlorperazine **OR** prochlorperazine **OR** prochlorperazine, simethicone, sorbitol, traMADol, zolpidem  Estanislado Emms, MD 11:32 AM 12/03/2022

## 2022-12-03 NOTE — Progress Notes (Signed)
Physical Therapy Session Note  Patient Details  Name: Corey [name] MRN: [id number] Date of Birth: [birth year]  Today's Date: 12/03/2022 PT Individual Time: 7829-5621 PT Individual Time Calculation (min): 45 min   Short Term Goals: Week 1:  PT Short Term Goal 1 (Week 1): pt will perform bed mobility with supervision PT Short Term Goal 2 (Week 1): pt will transfer bed<>chair with LRAD and CGA PT Short Term Goal 3 (Week 1): pt will transfer sit<>stand with LRAD and min A  Skilled Therapeutic Interventions/Progress Updates:   Pt received sitting in wheelchair; reports pain to be 8/10 L lower extremity. Pt states nurse gave him pain meds about 15 min prior; pt agreeable to therapy.  Therex: R LAQ x 20, R iso LAQ with hip abduction/adduction x 20; glute set seated x 20; w/c pushups x 20; L hip flexion x 20; L hip circles into ER x 20; w/c propulsion performed forward x 318 ft and backward x 318 ft with rest breaks needed. PT explained the importance of retro w/c propulsion for posterior chain strengthening. Pt stated understanding.  Pt left in wheelchair, seat belt alarm on, call light and all needs within reach. No reports of pain, only fatigue post tx.      Therapy Documentation Precautions:  Precautions Precautions: Fall, Other (comment) Precaution Comments: watch BP; blind in L eye Restrictions Weight Bearing Restrictions: Yes LLE Weight Bearing: Non weight bearing   Therapy/Group: Individual Therapy  Luna Fuse 12/03/2022, 7:30 AM

## 2022-12-04 NOTE — Progress Notes (Signed)
PROGRESS NOTE   Subjective/Complaints: No events overnight.  Vitals generally stable, intermittent use of nasal cannula 1 to 2 L. Eating 100% of meals per documentation Continent bowel and bladder, last bowel movement 8-31, medium  Evaluated patient in room, family at bedside, adult children and grandchild present.  Daughter expresses that the patient has had been having some overnight nosebleeds with his current oxygen.  Phantom limb pains at nighttime remains severe and causing difficulty sleeping.  Patient overall pleasant mood today, discussed dispo planning and states he will be spending a lot of time with his grandchild, which is a positive thing.   ROS: Phantom limb pain, nosebleeds, as per HPI above. Denies fevers, chills, N/V, abdominal pain, constipation, diarrhea, SOB, cough, chest pain, new weakness or paraesthesias.     Objective:   No results found. Recent Labs    12/03/22 1139  WBC 9.8  HGB 9.0*  HCT 31.5*  PLT 206   Recent Labs    12/03/22 1139  NA 140  K 4.6  CL 101  CO2 27  GLUCOSE 93  BUN 40*  CREATININE 5.46*  CALCIUM 8.6*    Intake/Output Summary (Last 24 hours) at 12/04/2022 0924 Last data filed at 12/04/2022 0981 Gross per 24 hour  Intake 240 ml  Output 1300 ml  Net -1060 ml        Physical Exam: Vital Signs Blood pressure 109/68, pulse 71, temperature 98.6 F (37 C), temperature source Oral, resp. rate 20, weight 50 kg, SpO2 96%. Gen: no distress, normal appearing, BMI is 13.78 Eyes: Pupils equal and round.  Looks in general direction.  Says can see shapes, but nothing clear  Cardiovascular:     Rate and Rhythm: Normal rate and regular rhythm.     Heart sounds: Normal heart sounds. No murmur heard.    No gallop.  Pulmonary:     Effort: Pulmonary effort is normal. No respiratory distress.     Breath sounds: No wheezing, rhonchi or rales.     Comments: Decreased at bases, otherwise  clear to auscultation. Abdominal:     General: Abdomen is flat. There is no distension.     Palpations: Abdomen is soft.     Tenderness: There is no abdominal tenderness.  Musculoskeletal:        General: No swelling or tenderness.     Cervical back: Neck supple. No tenderness.     Comments: Amputation site well wrapped in figure-of-eight for Ace bandage, loose and dog-eared; rewrapped  Right foot with overgrown toe nails.   Ues 5-/5 B/L RLE- HF 4-/5; otherwise DF 0/5 and PF and KE 2-/5 LLE- L AKA- with HF 4-/5   Skin:     General: Skin is warm and dry.     Comments: Boggy area infra clavicular area--non tender and reported to be stable. Multiple grafts RUE--non functional. R-internal jugular noted. RLE with areas of discoloration and stasis changes.  R HD catheter looks OK 2 IV"s LUE- look OK  Leg/ L AKA- tip elevated on pillow-dressing with some dried blood, staples appear well-approximated and intact.  No apparent drainage or edema.  Healing well.  Neurological:     Mental Status:  He is alert and oriented to person, place, and time.     Comments: Decreased to absent below R knee  Psychiatric:        Mood and Affect: Mood normal.        Behavior: Behavior normal.   Assessment/Plan: 1. Functional deficits which require 3+ hours per day of interdisciplinary therapy in a comprehensive inpatient rehab setting. Physiatrist is providing close team supervision and 24 hour management of active medical problems listed below. Physiatrist and rehab team continue to assess barriers to discharge/monitor patient progress toward functional and medical goals  Care Tool:  Bathing    Body parts bathed by patient: Right arm, Left arm, Chest, Abdomen, Front perineal area, Buttocks, Right upper leg, Left upper leg, Face, Right lower leg     Body parts n/a: Left lower leg   Bathing assist Assist Level: Supervision/Verbal cueing     Upper Body Dressing/Undressing Upper body dressing   What  is the patient wearing?: Pull over shirt    Upper body assist Assist Level: Minimal Assistance - Patient > 75%    Lower Body Dressing/Undressing Lower body dressing      What is the patient wearing?: Incontinence brief, Pants, Ace wrap/stump shrinker     Lower body assist Assist for lower body dressing: Moderate Assistance - Patient 50 - 74%     Toileting Toileting    Toileting assist Assist for toileting: Moderate Assistance - Patient 50 - 74%     Transfers Chair/bed transfer  Transfers assist  Chair/bed transfer activity did not occur: Safety/medical concerns  Chair/bed transfer assist level: Contact Guard/Touching assist (slideboard)     Locomotion Ambulation   Ambulation assist   Ambulation activity did not occur: Safety/medical concerns (pain, fear of falling, decreased balance, protection of contralateral limb)          Walk 10 feet activity   Assist  Walk 10 feet activity did not occur: Safety/medical concerns (pain, fear of falling, decreased balance, protection of contralateral limb)        Walk 50 feet activity   Assist Walk 50 feet with 2 turns activity did not occur: Safety/medical concerns (pain, fear of falling, decreased balance, protection of contralateral limb)         Walk 150 feet activity   Assist Walk 150 feet activity did not occur: Safety/medical concerns (pain, fear of falling, decreased balance, protection of contralateral limb)         Walk 10 feet on uneven surface  activity   Assist Walk 10 feet on uneven surfaces activity did not occur: Safety/medical concerns (pain, fear of falling, decreased balance, protection of contralateral limb)         Wheelchair     Assist Is the patient using a wheelchair?: Yes Type of Wheelchair: Manual Wheelchair activity did not occur: Safety/medical concerns (pain)  Wheelchair assist level: Supervision/Verbal cueing Max wheelchair distance: 126ft    Wheelchair 50  feet with 2 turns activity    Assist    Wheelchair 50 feet with 2 turns activity did not occur: Safety/medical concerns (pain)   Assist Level: Supervision/Verbal cueing   Wheelchair 150 feet activity     Assist  Wheelchair 150 feet activity did not occur: Safety/medical concerns (pain)       Blood pressure 109/68, pulse 71, temperature 98.6 F (37 C), temperature source Oral, resp. rate 20, weight 50 kg, SpO2 96%.  Medical Problem List and Plan: 1. Functional deficits secondary to  L AKA secondary to left  foot wound 2/2 PAD             -patient may not shower due to R HD catheter             -ELOS/Goals: 12-14 days supervision Continue CIR   2.  Antithrombotics: -DVT/anticoagulation:  Pharmaceutical: Eliquis. IVC filter in place.              -antiplatelet therapy: N/A 3. Penile pain: topical bacitracin ordered  4. Insomnia complicated by phantom pain: decrease amitriptyline to 10mg  since not helping with his sleep and cannot increase further given cardiac history  -Continues to be limited by phantom pain with difficulty sleeping; already getting as needed Ambien, would not further dose increase sleep medications  5. Neuropsych/cognition: This patient is capable of making decisions on his own behalf. 6. Skin/Wound Care: Routine pressure relief measure 7. Fluids/Electrolytes/Nutrition:  Strict I/O. Daily weights. Diet changed to Renal per input from nephrology. 8. L-AKA: Monitor incision for healing. Dry dressing. Shrinkers ordered  -8-31, 9/1: Per patient, not tolerating shrinkers s, but tolerating figure-of-eight ace wrap.  Continue.  9. ESRD: Renal diet with 1200 cc FR/HD TTS at the end of the day to help with tolerance of therapy.  10. H/o AAA aneurysm rupture s/p repair:  with resultant paraplegia, sensory deficits from saddle down and cauda equina syndrome             --was able to walk till a few years ago when left knee gave out.              --has severe LLQ  pain prior to defecation. Continue Senna/miralax--discussed suppository every other day but patient wants to think on it "that's too much"  11. Anemia of chronic disease:] 12. L- PNA and parapneumonic effusion s/p CT: Has completed 2 week course of antibiotics.  13. Fluid overload: Continue to monitor for symptoms and wean oxygen as tolerated.   -Weights downtrending Filed Weights   12/03/22 0501 12/04/22 0600  Weight: 54 kg 50 kg    14. Neurogenic bowel: Will monitor for now.  Last bowel movement 8-31    15. Neuropathic pain- will change gabapentin to 300 mg at bedtime, decrease amitriptyline to 10mg  since not helping with sleep and we cannot increase further due to cardiac history  9/1: already on max dose gabapentin for renal function and with reducing amitriptyline as above.  Discussed with patient transition to Lyrica, however he states he had side effects to this in the past and that gabapentin works better.  No medication changes.  16. Leukocytosis: WBC reviewed and has normalized  17. Underweight: provide dietary education  18. Suboptimal vitamin D: will not start supplement due to allergy to doxecalciferol. Will order grounds pass so he can have sunlight exposure.   19. Chest pain, chronic: renal is aware, continue ultrafiltration with dialysis  20.  Overnight epistaxis.  Reordered oxygen is humidified.  LOS: 5 days A FACE TO FACE EVALUATION WAS PERFORMED  Angelina Sheriff 12/04/2022, 9:24 AM

## 2022-12-04 NOTE — Progress Notes (Signed)
[facility] NEPHROLOGY PROGRESS NOTE  Subjective:  Last HD on 8/31 with 1.3 kg UF.   He feels ok today.  He wants to make sure that he can get transportation set up to and from his new outpatient HD unit.  His daughter, son-in-law, and grandson are here visiting today.  Grandson was excited to see a doctor.   Review of systems:     Shortness of breath has gotten better over time.  No chest pain Denies n/v   Objective Vital signs in last 24 hours:    12/04/2022    6:00 AM 12/03/2022    7:54 PM 12/03/2022    6:47 PM  Vitals with BMI  Weight 110 lbs 4 oz    BMI 13.78    Systolic 109 105 829  Diastolic 68 65 80  Pulse 71 71 71   Medications reviewed  Physical Exam:       General adult male in chair in no acute distress HEENT normocephalic atraumatic extraocular movements intact sclera anicteric Neck supple trachea midline Lungs clear to auscultation bilaterally; basilar crackles; normal work of breathing at rest on 1 liter Heart S1S2 no rub Abdomen soft nontender nondistended Extremities trace to 1+ edema right leg; left AKA  Psych normal mood and affect Neuro - alert and oriented x 3 provides hx and follows commands; visually impaired Access RIJ tunn catheter in place; failed accesses bilateral upper extremities       OP HD: TTS [facility] 4h  2/2 bath  66kg   TDC   no heparin (allergy)  lock TDC w/ citrate - No ESA or VDRA - Binder: Renvela pwdr 4.8g TID - Sensipar 180mg  every day at home    Assessment/ Plan  # Acute hypoxic respiratory failure: In setting of missed HD, pna w/ effusion. CXR with pleural effusion s/p chest tube 7/31 now removed.  Improved. Optimize volume status with HD   # PNA/ L parapneumonic pleural effusion: s/p thoracentesis with cultures. S/p IV abx course and finished with augmentin  #Left leg wound - s/p AKA 8/15  # ESRD: continue HD per TTS schedule.  UF as able with support of albumin.  Hypotension limits UF.  - note that he  will need a lower EDW given the amputation.  For now would consider 50 kg as EDW and continue to challenge here and outpatient as needed - fluid restrictions are in place  #Heparin allergy - use citrate for cath lock.  #Hypotension/ volume:  He is on midodrine. UF as tolerated with HD   #Anemia of ESRD: have increased aranesp to 100 mcg every Sat for the 8/31 dose onward.  Anticipate need to further increase  # Secondary HPTH: switched to sevelamer per pharm recs.  At one point cinacalcet was held due to hypocalcemia but then resumed at 90 mg daily (half of his normal dose).  Stopped cinacalcet again on 8/26 due to hypocalcemia.  Check intact PTH.  # Hypocalcemia - see above - holding sensipar.  Improved after stopping sensipar.  May be able to start back at a lower dose if needed soon.  #A-fib: On amiodarone and eliquis - per primary team; has IVC filter  # H/o type B aortic dissection s/p repair (2004)   Dispo - I have spoken to SW about getting him a new outpatient HD unit.  He plans to relocate to [place], [place] after discharge.  Need to confirm that a new outpatient HD unit is in place prior to discharge.  As above, he would also like to make sure that transportation to/from dialysis is set up    Recent Labs  Lab 12/01/22 0437 12/03/22 1139  HGB 8.3* 9.0*  ALBUMIN 2.6* 2.9*  CALCIUM 8.1* 8.6*  PHOS 2.5 3.3  CREATININE 5.42* 5.46*  K 3.8 4.6    Inpatient medications:  acetaminophen  650 mg Oral TID WC & HS   amiodarone  200 mg Oral Daily   amitriptyline  10 mg Oral QHS   apixaban  2.5 mg Oral BID   bacitracin   Topical BID   Chlorhexidine Gluconate Cloth  6 each Topical Q12H   Chlorhexidine Gluconate Cloth  6 each Topical Q0600   darbepoetin (ARANESP) injection - DIALYSIS  100 mcg Subcutaneous Q Sat-1800   docusate sodium  100 mg Oral Daily   feeding supplement  237 mL Oral BID BM   gabapentin  300 mg Oral QHS   hydrocerin   Topical Daily   lidocaine  3 patch  Transdermal Q24H   lidocaine   Topical TID   midodrine  15 mg Oral TID WC   multivitamin  1 tablet Oral QHS   polyethylene glycol  17 g Oral BID   senna-docusate  1 tablet Oral BID   sevelamer carbonate  4.8 g Oral TID WC      acetaminophen, bisacodyl, calcium carbonate (dosed in mg elemental calcium), camphor-menthol **AND** hydrOXYzine, diphenhydrAMINE, guaiFENesin-dextromethorphan, methocarbamol, milk and molasses, phenol, prochlorperazine **OR** prochlorperazine **OR** prochlorperazine, simethicone, sorbitol, traMADol, zolpidem  Estanislado Emms, MD 10:07 AM 12/04/2022

## 2022-12-05 ENCOUNTER — Inpatient Hospital Stay (HOSPITAL_COMMUNITY): Payer: 59

## 2022-12-05 LAB — CBC
HCT: 29.6 % — ABNORMAL LOW (ref 39.0–52.0)
Hemoglobin: 8.3 g/dL — ABNORMAL LOW (ref 13.0–17.0)
MCH: 27.2 pg (ref 26.0–34.0)
MCHC: 28 g/dL — ABNORMAL LOW (ref 30.0–36.0)
MCV: 97 fL (ref 80.0–100.0)
Platelets: 179 10*3/uL (ref 150–400)
RBC: 3.05 MIL/uL — ABNORMAL LOW (ref 4.22–5.81)
RDW: 18.6 % — ABNORMAL HIGH (ref 11.5–15.5)
WBC: 8.9 10*3/uL (ref 4.0–10.5)
nRBC: 0 % (ref 0.0–0.2)

## 2022-12-05 LAB — RENAL FUNCTION PANEL
Albumin: 2.9 g/dL — ABNORMAL LOW (ref 3.5–5.0)
Anion gap: 13 (ref 5–15)
BUN: 54 mg/dL — ABNORMAL HIGH (ref 6–20)
CO2: 26 mmol/L (ref 22–32)
Calcium: 9.4 mg/dL (ref 8.9–10.3)
Chloride: 99 mmol/L (ref 98–111)
Creatinine, Ser: 6.29 mg/dL — ABNORMAL HIGH (ref 0.61–1.24)
GFR, Estimated: 10 mL/min — ABNORMAL LOW (ref >60)
Glucose, Bld: 87 mg/dL (ref 70–99)
Phosphorus: 3.2 mg/dL (ref 2.5–4.6)
Potassium: 4.8 mmol/L (ref 3.5–5.1)
Sodium: 138 mmol/L (ref 135–145)

## 2022-12-05 LAB — SARS CORONAVIRUS 2 BY RT PCR: SARS Coronavirus 2 by RT PCR: NEGATIVE

## 2022-12-05 MED ORDER — CHLORHEXIDINE GLUCONATE CLOTH 2 % EX PADS: 6.0000 | MEDICATED_PAD | Freq: Every day | CUTANEOUS | Status: DC

## 2022-12-05 MED ORDER — MELATONIN 3 MG PO TABS
3.0000 mg | ORAL_TABLET | Freq: Every day | ORAL | Status: DC
  Administered 2022-12-05 – 2022-12-13 (×9): 3 mg via ORAL
  Filled 2022-12-05 (×9): qty 1

## 2022-12-05 MED ORDER — ALBUMIN HUMAN 25 % IV SOLN
25.0000 g | Freq: Once | INTRAVENOUS | Status: AC
  Administered 2022-12-08: 25 g via INTRAVENOUS
  Filled 2022-12-05 (×2): qty 100

## 2022-12-05 NOTE — Progress Notes (Signed)
Physical Therapy Session Note  Patient Details  Name: Corey [name] MRN: [id number] Date of Birth: 04-09-1970  Today's Date: 12/05/2022 PT Individual Time: 6578-4696 PT Individual Time Calculation (min): 71 min   Short Term Goals: Week 1:  PT Short Term Goal 1 (Week 1): pt will perform bed mobility with supervision PT Short Term Goal 2 (Week 1): pt will transfer bed<>chair with LRAD and CGA PT Short Term Goal 3 (Week 1): pt will transfer sit<>stand with LRAD and min A  Skilled Therapeutic Interventions/Progress Updates:   Received pt sitting in WC. Pt reported "not doing well" rating pain 8.5/10 in L residual limb - RN present to administer medication. Pt reported not sleeping in 3 days due to pain and feels very "weak". Pt on 2L with SPO2 98% - titrated to 1L and SPO2 dropped as low as 90% but only able to recover to 94%. BP assessed and 94/59 with pt reported feeling lightheaded - RN notified and MD aware.   Nephrology PA present for brief assessment. Pt politely refused wearing shrinker today due to pain and declined need to rewrap residual limb, stating nursing wrapped it last night and it was still on properly. Pt performed WC mobility 107ft using BUE and supervision to dayroom - noted WC pulling to the L and armrest getting stuck on side of chair. Pt transferred WC<>mat via lateral scoot with CGA and therapist switched out current WC for new one and inflated Roho per pt request (although Roho seemed to have plenty of air). Pt reported increased back pain sitting unsupported on mat and requested to try back brace - MD notified. Pt reporting increased dizziness and requested to lie down - transferred sit<>supine on wedge and MD arrived for morning rounds. Discussed current limitations and MD suggested running additional tests. Pt falling in/out of sleep while on mat but motivated to "do something". Transferred semi-reclined<>long sitting with mod A for trunk support and transferred mat<>slideboard  with supervision. Pt performed the following exercises with emphasis on UE strength/ROM: -lateral trunk rotations with 2.2lb medicine ball 2x20 bilaterally -horizontal chest press with 2.2lb medicine ball 2x10 bilaterally  -overhead chest press with 2.2lb medicine ball 2x10 bilaterally Transported back to room in [facility] dependently and suggested returning to bed to rest prior to next OT session. Pt transferred WC<>bed via slideboard with CGA and sit<>supine with supervision. Pt scooted to [facility] with supervision pulling on headboard and concluded session with pt semi-reclined in bed with all needs within reach. Pt unable to reach 96% or higher on 1L and orders for O2 >96%, therefore increased to 2L and pt left on 2L. Pt asleep before therapist could exit room.   Therapy Documentation Precautions:  Precautions Precautions: Fall, Other (comment) Precaution Comments: watch BP; blind in L eye Restrictions Weight Bearing Restrictions: Yes LLE Weight Bearing: Non weight bearing  Therapy/Group: Individual Therapy [name] [name] PT, DPT 12/05/2022, 6:51 AM

## 2022-12-05 NOTE — Progress Notes (Signed)
Occupational Therapy Session Note  Patient Details  Name: Corey [name] MRN: [id number] Date of Birth: 12/29/1970  Today's Date: 12/05/2022 OT Individual Time: 1120-1200 OT Individual Time Calculation (min): 40 min  OT missed time: 30 min Missed time reason: fatigue; sleeping    Short Term Goals: Week 1:  OT Short Term Goal 1 (Week 1): Pt will complete LB dressing with CGA OT Short Term Goal 2 (Week 1): Pt will report < 5/10 pain for 2 consecutive sessions OT Short Term Goal 3 (Week 1): Pt will complete toileting with CGA  Skilled Therapeutic Interventions/Progress Updates:  Session 1 Skilled OT intervention completed with focus on activity tolerance, BUE endurance. Pt received semi supine in bed, reporting feeling very weak and in pain this AM. 8/10 pain reported in Lt residual limb and flank; pre-medicated. OT offered rest breaks, repositioning throughout for pain reduction.  Pt requesting to remain in bed due to fatigue and pain, politely declining self-care needs. Pt agreeable to bed level exercises with pt's music genre of choice to boost moral which pt really enjoyed.  (With yellow theraband anchored on bed rail) 12 reps each arm Horizontal abduction/adduction Shoulder flexion Bicep flexion Chest presses Shoulder external/internal rotation Shoulder extension -low vision cues and strategies needed  Nurse present for meds. Pt remained semi upright in bed, with bed alarm on/activated, and with all needs in reach at end of session.  Vitals -Received on 2L via [place], SPO2 91% -88% after activity on 2L but rebounded to 93%  Session 2 Pt received with nursing, having just woken pt for meds. Per nursing and pt, pt hasn't slept well/at all in 3 days and politely requested for pt to defer therapy until later time when pt is able to sleep some. Pt missed 30 mins of OT intervention secondary to fatigue; OT will make up missed time as able.    Therapy Documentation Precautions:   Precautions Precautions: Fall, Other (comment) Precaution Comments: watch BP; blind in L eye Restrictions Weight Bearing Restrictions: Yes LLE Weight Bearing: Non weight bearing    Therapy/Group: Individual Therapy  Melvyn Novas, MS, OTR/L  12/05/2022, 3:14 PM

## 2022-12-05 NOTE — Progress Notes (Signed)
Orthopedic Tech Progress Note Patient Details:  Corey [name] Apr 22, 1970 [id number]  Dropped off   Ortho Devices Type of Ortho Device: Lumbar corsett Ortho Device/Splint Location: BACK Ortho Device/Splint Interventions: Ordered   Post Interventions Patient Tolerated: Well Instructions Provided: Care of device  [name] 12/05/2022, 11:27 AM

## 2022-12-05 NOTE — Progress Notes (Signed)
Physical Therapy Session Note  Patient Details  Name: Corey [name] MRN: [id number] Date of Birth: 01-16-1971  Today's Date: 12/05/2022 PT Individual Time: 1303-1400 PT Individual Time Calculation (min): 57 min   Short Term Goals: Week 1:  PT Short Term Goal 1 (Week 1): pt will perform bed mobility with supervision PT Short Term Goal 2 (Week 1): pt will transfer bed<>chair with LRAD and CGA PT Short Term Goal 3 (Week 1): pt will transfer sit<>stand with LRAD and min A  Skilled Therapeutic Interventions/Progress Updates: Pt presents supine in bed and agreeable to therapy.  Pt performed HS/hip flexion 3 x 10, abd/add 3 x10 w/ AAROM RLE.  O2 monitored during performance on 2LPM, w/ 1 episode of decreasing to 89% and not returning to > 96% so O2 increased to 3 LPM x 1' w/ PLB education.  Pt performed horizontal abd/add and shoulder flexion 3 x 10.  Pt transfers sup to sit w/ supervision using side rails.  Pt performed R LAQ 2 x 10, scapular pinches, c/spine flex/ext and rotation.  Pt maintains flexed head w/o cueing.  Pt unable to maintain upright head 2/2 pain.  Pt returned to sidelying and rolled side to side for re-adjustment of chux pad.  Pt able to pull self to [facility] using side rails.  Pt remained supine w/ all needs in reach.     Therapy Documentation Precautions:  Precautions Precautions: Fall, Other (comment) Precaution Comments: watch BP; blind in L eye Restrictions Weight Bearing Restrictions: Yes LLE Weight Bearing: Non weight bearing General:   Vital Signs:   Pain:8/10 residual limb and into L trunk    Therapy/Group: Individual Therapy  Lucio Edward 12/05/2022, 2:03 PM

## 2022-12-05 NOTE — Progress Notes (Signed)
PROGRESS NOTE   Subjective/Complaints: Feeling unwell this morning Satting at 98 percent on 2L so was brought down to 1L and is satting 94%. Did not use oxygen at home   ROS: +Phantom limb pain, nosebleeds, +malaise Denies fevers, chills, N/V, abdominal pain, constipation, diarrhea, SOB, cough, chest pain, new weakness or paraesthesias.     Objective:   No results found. Recent Labs    12/03/22 1139 12/05/22 0637  WBC 9.8 8.9  HGB 9.0* 8.3*  HCT 31.5* 29.6*  PLT 206 179   Recent Labs    12/03/22 1139 12/05/22 0637  NA 140 138  K 4.6 4.8  CL 101 99  CO2 27 26  GLUCOSE 93 87  BUN 40* 54*  CREATININE 5.46* 6.29*  CALCIUM 8.6* 9.4    Intake/Output Summary (Last 24 hours) at 12/05/2022 1017 Last data filed at 12/05/2022 0800 Gross per 24 hour  Intake 1080 ml  Output --  Net 1080 ml        Physical Exam: Vital Signs Blood pressure 116/70, pulse 63, temperature 98.1 F (36.7 C), temperature source Oral, resp. rate 20, weight 54 kg, SpO2 92%. Gen: no distress, normal appearing, BMI is 13.78 Eyes: Pupils equal and round.  Looks in general direction.  Says can see shapes, but nothing clear  Cardiovascular:     Rate and Rhythm: Normal rate and regular rhythm.     Heart sounds: Normal heart sounds. No murmur heard.    No gallop.  Pulmonary:     Effort: Pulmonary effort is normal. No respiratory distress.     Breath sounds: No wheezing, rhonchi or rales.     Comments: Decreased at bases, otherwise clear to auscultation. Abdominal:     General: Abdomen is flat. There is no distension.     Palpations: Abdomen is soft.     Tenderness: There is no abdominal tenderness.  Musculoskeletal:        General: No swelling or tenderness.     Cervical back: Neck supple. No tenderness.     Comments: Amputation site well wrapped in figure-of-eight for Ace bandage, loose and dog-eared; rewrapped  Right foot with overgrown  toe nails.   Ues 5-/5 B/L RLE- HF 4-/5; otherwise DF 0/5 and PF and KE 2-/5 LLE- L AKA- with HF 4-/5   Skin:     General: Skin is warm and dry.     Comments: Boggy area infra clavicular area--non tender and reported to be stable. Multiple grafts RUE--non functional. R-internal jugular noted. RLE with areas of discoloration and stasis changes.  R HD catheter looks OK 2 IV"s LUE- look OK  Leg/ L AKA- tip elevated on pillow-dressing with some dried blood, staples appear well-approximated and intact.  No apparent drainage or edema.  Healing well.  Neurological:     Mental Status: He is alert and oriented to person, place, and time.     Comments: Decreased to absent below R knee  Psychiatric:        Mood and Affect: Mood normal.        Behavior: Behavior normal.   Assessment/Plan: 1. Functional deficits which require 3+ hours per day of interdisciplinary therapy in a  comprehensive inpatient rehab setting. Physiatrist is providing close team supervision and 24 hour management of active medical problems listed below. Physiatrist and rehab team continue to assess barriers to discharge/monitor patient progress toward functional and medical goals  Care Tool:  Bathing    Body parts bathed by patient: Right arm, Left arm, Chest, Abdomen, Front perineal area, Buttocks, Right upper leg, Left upper leg, Face, Right lower leg     Body parts n/a: Left lower leg   Bathing assist Assist Level: Supervision/Verbal cueing     Upper Body Dressing/Undressing Upper body dressing   What is the patient wearing?: Pull over shirt    Upper body assist Assist Level: Minimal Assistance - Patient > 75%    Lower Body Dressing/Undressing Lower body dressing      What is the patient wearing?: Incontinence brief, Pants, Ace wrap/stump shrinker     Lower body assist Assist for lower body dressing: Moderate Assistance - Patient 50 - 74%     Toileting Toileting    Toileting assist Assist for  toileting: Moderate Assistance - Patient 50 - 74%     Transfers Chair/bed transfer  Transfers assist  Chair/bed transfer activity did not occur: Safety/medical concerns  Chair/bed transfer assist level: Contact Guard/Touching assist (slideboard)     Locomotion Ambulation   Ambulation assist   Ambulation activity did not occur: Safety/medical concerns (pain, fear of falling, decreased balance, protection of contralateral limb)          Walk 10 feet activity   Assist  Walk 10 feet activity did not occur: Safety/medical concerns (pain, fear of falling, decreased balance, protection of contralateral limb)        Walk 50 feet activity   Assist Walk 50 feet with 2 turns activity did not occur: Safety/medical concerns (pain, fear of falling, decreased balance, protection of contralateral limb)         Walk 150 feet activity   Assist Walk 150 feet activity did not occur: Safety/medical concerns (pain, fear of falling, decreased balance, protection of contralateral limb)         Walk 10 feet on uneven surface  activity   Assist Walk 10 feet on uneven surfaces activity did not occur: Safety/medical concerns (pain, fear of falling, decreased balance, protection of contralateral limb)         Wheelchair     Assist Is the patient using a wheelchair?: Yes Type of Wheelchair: Manual Wheelchair activity did not occur: Safety/medical concerns (pain)  Wheelchair assist level: Supervision/Verbal cueing Max wheelchair distance: 166ft    Wheelchair 50 feet with 2 turns activity    Assist    Wheelchair 50 feet with 2 turns activity did not occur: Safety/medical concerns (pain)   Assist Level: Supervision/Verbal cueing   Wheelchair 150 feet activity     Assist  Wheelchair 150 feet activity did not occur: Safety/medical concerns (pain)       Blood pressure 116/70, pulse 63, temperature 98.1 F (36.7 C), temperature source Oral, resp. rate 20,  weight 54 kg, SpO2 92%.  Medical Problem List and Plan: 1. Functional deficits secondary to  L AKA secondary to left foot wound 2/2 PAD             -patient may not shower due to R HD catheter             -ELOS/Goals: 12-14 days supervision Continue CIR, communicated plan with PT and OT  2.  Antithrombotics: -DVT/anticoagulation:  Pharmaceutical: Eliquis. IVC filter in place.              -  antiplatelet therapy: N/A 3. Penile pain: topical bacitracin ordered  4. Insomnia complicated by phantom pain: d/c amitriptyline since not helping with his sleep and cannot increase further given cardiac history. Add melatonin 3mg  HS  -Continues to be limited by phantom pain with difficulty sleeping; already getting as needed Ambien, would not further dose increase sleep medications  5. Neuropsych/cognition: This patient is capable of making decisions on his own behalf. 6. Skin/Wound Care: Routine pressure relief measure 7. Fluids/Electrolytes/Nutrition:  Strict I/O. Daily weights. Diet changed to Renal per input from nephrology. 8. L-AKA: Monitor incision for healing. Dry dressing. Shrinkers ordered  -8-31, 9/1: Per patient, not tolerating shrinkers s, but tolerating figure-of-eight ace wrap.  Continue.  9. ESRD: Renal diet with 1200 cc FR/HD TTS at the end of the day to help with tolerance of therapy.  10. H/o AAA aneurysm rupture s/p repair:  with resultant paraplegia, sensory deficits from saddle down and cauda equina syndrome             --was able to walk till a few years ago when left knee gave out.              --has severe LLQ pain prior to defecation. Continue Senna/miralax--discussed suppository every other day but patient wants to think on it "that's too much"  11. Anemia of chronic disease:] 12. L- PNA and parapneumonic effusion s/p CT: Has completed 2 week course of antibiotics.  13. Fluid overload: Continue to monitor for symptoms and wean oxygen as tolerated.   -Weights up to baseline  on 9/2 Filed Weights   12/03/22 0501 12/04/22 0600 12/05/22 0430  Weight: 54 kg 50 kg 54 kg    14. Neurogenic bowel: Will monitor for now.  Last bowel movement 8-31    15. Neuropathic pain- will change gabapentin to 300 mg at bedtime, decrease amitriptyline to 10mg  since not helping with sleep and we cannot increase further due to cardiac history  D/c amitriptyline since it is not helping and given cardiac risk factors  16. Leukocytosis: WBC reviewed and has normalized  17. Underweight: provide dietary education  18. Suboptimal vitamin D: will not start supplement due to allergy to doxecalciferol. Will order grounds pass so he can have sunlight exposure.   19. Chest pain, chronic: renal is aware, continue ultrafiltration with dialysis  20.  Overnight epistaxis.  Reordered oxygen to be humidified.  21. Malaise: oxygen reviewed and is satting well, weekend notes and nephrology notes reviewed, CXR and COVID test ordered to r/o infection  I spent >63mins performing patient care related activities, including face to face time, documentation time, med management, discussion of meds and lab orders with patient, and overall coordination of care.    LOS: 6 days A FACE TO FACE EVALUATION WAS PERFORMED  Drema Pry Tripp Goins 12/05/2022, 10:17 AM

## 2022-12-05 NOTE — Progress Notes (Signed)
[facility] Progress Note   Subjective:   Not feeling well this AM, he reports he was dizzy earlier. Was on 2L O2, titrated down to 1L. Reports his SOB was a little bit better after last dialysis. Denies CP, palpitations, N/V/D.  Objective Vitals:   12/04/22 1403 12/04/22 1918 12/05/22 0244 12/05/22 0430  BP: 100/60 115/76 116/70   Pulse: 70 65 63   Resp: 18 18 20    Temp: 97.6 F (36.4 C) 97.6 F (36.4 C) 98.1 F (36.7 C)   TempSrc: Oral  Oral   SpO2: 100% 98% 92%   Weight:    54 kg   Physical Exam General:  Alert male in wheelchair, NAD Heart: RRR, no murmurs, rubs or gallops Lungs: CTA bilaterally, on O2 1L via [place] Abdomen: Soft, non-distended, +BS Extremities: 1+ edema RLE Dialysis Access: R internal jugular [facility]  Additional Objective Labs: Basic Metabolic Panel: Recent Labs  Lab 12/01/22 0437 12/03/22 1139 12/05/22 0637  NA 139 140 138  K 3.8 4.6 4.8  CL 99 101 99  CO2 25 27 26   GLUCOSE 91 93 87  BUN 35* 40* 54*  CREATININE 5.42* 5.46* 6.29*  CALCIUM 8.1* 8.6* 9.4  PHOS 2.5 3.3 3.2   Liver Function Tests: Recent Labs  Lab 12/01/22 0437 12/03/22 1139 12/05/22 0637  ALBUMIN 2.6* 2.9* 2.9*   No results for input(s): "LIPASE", "AMYLASE" in the last 168 hours. CBC: Recent Labs  Lab 11/29/22 0352 12/01/22 0437 12/03/22 1139 12/05/22 0637  WBC 10.3 9.0 9.8 8.9  HGB 8.1* 8.3* 9.0* 8.3*  HCT 28.2* 28.9* 31.5* 29.6*  MCV 94.6 94.4 98.1 97.0  PLT 273 231 206 179   Blood Culture    Component Value Date/Time   SDES BLOOD LEFT ARM 11/03/2022 0855   SPECREQUEST  11/03/2022 0855    BOTTLES DRAWN AEROBIC AND ANAEROBIC Blood Culture adequate volume   CULT  11/03/2022 0855    NO GROWTH 5 DAYS Performed at [facility], [address], [place], [place] [address]    REPTSTATUS 11/08/2022 FINAL 11/03/2022 0855    Cardiac Enzymes: No results for input(s): "CKTOTAL", "CKMB", "CKMBINDEX", "TROPONINI" in the last 168 hours. CBG: No  results for input(s): "GLUCAP" in the last 168 hours. Iron Studies: No results for input(s): "IRON", "TIBC", "TRANSFERRIN", "FERRITIN" in the last 72 hours. @lablastinr3 @ Studies/Results: No results found. Medications:   acetaminophen  650 mg Oral TID WC & HS   amiodarone  200 mg Oral Daily   apixaban  2.5 mg Oral BID   bacitracin   Topical BID   Chlorhexidine Gluconate Cloth  6 each Topical Q12H   Chlorhexidine Gluconate Cloth  6 each Topical Q0600   darbepoetin (ARANESP) injection - DIALYSIS  100 mcg Subcutaneous Q Sat-1800   docusate sodium  100 mg Oral Daily   feeding supplement  237 mL Oral BID BM   gabapentin  300 mg Oral QHS   hydrocerin   Topical Daily   lidocaine  3 patch Transdermal Q24H   lidocaine   Topical TID   melatonin  3 mg Oral QHS   midodrine  15 mg Oral TID WC   multivitamin  1 tablet Oral QHS   polyethylene glycol  17 g Oral BID   senna-docusate  1 tablet Oral BID   sevelamer carbonate  4.8 g Oral TID WC    OP Dialysis Orders: TTS [facility] 4h  2/2 bath  66kg   TDC   no heparin (allergy)  lock  TDC w/ citrate - No ESA or VDRA - Binder: Renvela pwdr 4.8g TID - Sensipar 180mg  every day at home  Assessment/Plan: # Acute hypoxic respiratory failure: In setting of missed HD, pna w/ effusion. CXR with pleural effusion s/p chest tube 7/31 now removed. On 1L O2 now. Optimize volume status with HD    # PNA/ L parapneumonic pleural effusion: s/p thoracentesis with cultures. S/p IV abx course and finished with augmentin   #Left leg wound - s/p AKA 8/15   # ESRD: continue HD per TTS schedule.  UF as able with support of albumin.  Hypotension limits UF.  - note that he will need a lower EDW given the amputation.  For now would consider 50 kg as EDW and continue to challenge here and outpatient as needed - fluid restrictions are in place   #Heparin allergy - use citrate for cath lock.   #Hypotension/ volume:  He is on midodrine. UF as tolerated with HD    #Anemia of  ESRD: Hgb 8.3. have increased aranesp to 100 mcg every Sat for the 8/31 dose onward.  Anticipate need to further increase   # Secondary HPTH: switched to sevelamer per pharm recs.  At one point cinacalcet was held due to hypocalcemia but then resumed at 90 mg daily (half of his normal dose).  Stopped cinacalcet again on 8/26 due to hypocalcemia.  Checking PTH with next labs   # Hypocalcemia - see above - holding sensipar.  Improved after stopping sensipar.  May be able to start back at a lower dose if needed soon.   #A-fib: On amiodarone and eliquis - per primary team; has IVC filter   # H/o type B aortic dissection s/p repair (2004)    Dispo -He plans to relocate to [place], [place] after discharge.  Need to confirm that a new outpatient HD unit is in place prior to discharge.  As above, he would also like to make sure that transportation to/from dialysis is set up  Rogers Blocker, PA-C 12/05/2022, 10:23 AM  [facility] Pager: [phone]

## 2022-12-05 NOTE — Progress Notes (Signed)
Patient ID: Corey [name], male   DOB: 1970-10-06, 52 y.o.   MRN: [id number]  No questions or concerns from patient currently.

## 2022-12-06 DIAGNOSIS — Z7189 Other specified counseling: Secondary | ICD-10-CM

## 2022-12-06 DIAGNOSIS — Z515 Encounter for palliative care: Secondary | ICD-10-CM

## 2022-12-06 LAB — CBC
HCT: 30.1 % — ABNORMAL LOW (ref 39.0–52.0)
Hemoglobin: 8.4 g/dL — ABNORMAL LOW (ref 13.0–17.0)
MCH: 27.2 pg (ref 26.0–34.0)
MCHC: 27.9 g/dL — ABNORMAL LOW (ref 30.0–36.0)
MCV: 97.4 fL (ref 80.0–100.0)
Platelets: 216 10*3/uL (ref 150–400)
RBC: 3.09 MIL/uL — ABNORMAL LOW (ref 4.22–5.81)
RDW: 18.9 % — ABNORMAL HIGH (ref 11.5–15.5)
WBC: 10.2 10*3/uL (ref 4.0–10.5)
nRBC: 0 % (ref 0.0–0.2)

## 2022-12-06 LAB — RENAL FUNCTION PANEL
Albumin: 2.9 g/dL — ABNORMAL LOW (ref 3.5–5.0)
Anion gap: 19 — ABNORMAL HIGH (ref 5–15)
BUN: 70 mg/dL — ABNORMAL HIGH (ref 6–20)
CO2: 24 mmol/L (ref 22–32)
Calcium: 9.6 mg/dL (ref 8.9–10.3)
Chloride: 98 mmol/L (ref 98–111)
Creatinine, Ser: 7.55 mg/dL — ABNORMAL HIGH (ref 0.61–1.24)
GFR, Estimated: 8 mL/min — ABNORMAL LOW (ref >60)
Glucose, Bld: 93 mg/dL (ref 70–99)
Phosphorus: 4 mg/dL (ref 2.5–4.6)
Potassium: 5.3 mmol/L — ABNORMAL HIGH (ref 3.5–5.1)
Sodium: 141 mmol/L (ref 135–145)

## 2022-12-06 MED ORDER — PENTAFLUOROPROP-TETRAFLUOROETH EX AERO: 1.0000 | INHALATION_SPRAY | CUTANEOUS | Status: DC | PRN

## 2022-12-06 MED ORDER — ALTEPLASE 2 MG IJ SOLR: 2.0000 mg | Freq: Once | INTRAMUSCULAR | Status: DC | PRN

## 2022-12-06 MED ORDER — LIDOCAINE-PRILOCAINE 2.5-2.5 % EX CREA: 1.0000 | TOPICAL_CREAM | CUTANEOUS | Status: DC | PRN

## 2022-12-06 MED ORDER — ANTICOAGULANT SODIUM CITRATE 4% (200MG/5ML) IV SOLN
5.0000 mL | Status: DC | PRN
  Administered 2022-12-06: 3.8 mL
  Filled 2022-12-06: qty 5

## 2022-12-06 MED ORDER — ANTICOAGULANT SODIUM CITRATE 4% (200MG/5ML) IV SOLN: 5.0000 mL | Status: DC | PRN

## 2022-12-06 MED ORDER — SUCROFERRIC OXYHYDROXIDE 500 MG PO CHEW
500.0000 mg | CHEWABLE_TABLET | Freq: Three times a day (TID) | ORAL | Status: DC
  Administered 2022-12-06 – 2022-12-14 (×19): 500 mg via ORAL
  Filled 2022-12-06 (×22): qty 1

## 2022-12-06 MED ORDER — LIDOCAINE HCL (PF) 1 % IJ SOLN: 5.0000 mL | INTRAMUSCULAR | Status: DC | PRN

## 2022-12-06 NOTE — Progress Notes (Signed)
[facility] Progress Note   Subjective:   Reports feeling better this AM. On 1L O2. Reports he had some dizziness with therapy, none at present. Denies CP, nausea,  vomiting. He is having trouble with binders, does not like texture. Is open to trying a chewable phos binder.   Objective Vitals:   12/05/22 0430 12/05/22 1432 12/05/22 2013 12/06/22 0506  BP:  109/71 125/81 109/71  Pulse:  70 65 65  Resp:  17 18 18   Temp:  98.7 F (37.1 C) 98.4 F (36.9 C) 98.3 F (36.8 C)  TempSrc:  Oral Oral Oral  SpO2:  92% 96% 96%  Weight: 54 kg      Physical Exam General: Alert male in NAD Heart:RRR, no murmurs, rubs or gallops Lungs: On O2 1L via [place], lungs CTA bilaterally Abdomen: Soft, non-distended, +BS Extremities: 1+ edema RLE Dialysis Access: R internal jugular [facility]  Additional Objective Labs: Basic Metabolic Panel: Recent Labs  Lab 12/01/22 0437 12/03/22 1139 12/05/22 0637  NA 139 140 138  K 3.8 4.6 4.8  CL 99 101 99  CO2 25 27 26   GLUCOSE 91 93 87  BUN 35* 40* 54*  CREATININE 5.42* 5.46* 6.29*  CALCIUM 8.1* 8.6* 9.4  PHOS 2.5 3.3 3.2   Liver Function Tests: Recent Labs  Lab 12/01/22 0437 12/03/22 1139 12/05/22 0637  ALBUMIN 2.6* 2.9* 2.9*   No results for input(s): "LIPASE", "AMYLASE" in the last 168 hours. CBC: Recent Labs  Lab 12/01/22 0437 12/03/22 1139 12/05/22 0637  WBC 9.0 9.8 8.9  HGB 8.3* 9.0* 8.3*  HCT 28.9* 31.5* 29.6*  MCV 94.4 98.1 97.0  PLT 231 206 179   Blood Culture    Component Value Date/Time   SDES BLOOD LEFT ARM 11/03/2022 0855   SPECREQUEST  11/03/2022 0855    BOTTLES DRAWN AEROBIC AND ANAEROBIC Blood Culture adequate volume   CULT  11/03/2022 0855    NO GROWTH 5 DAYS Performed at [facility], [address], [place], [place] [address]    REPTSTATUS 11/08/2022 FINAL 11/03/2022 0855    Cardiac Enzymes: No results for input(s): "CKTOTAL", "CKMB", "CKMBINDEX", "TROPONINI" in the last 168 hours. CBG: No  results for input(s): "GLUCAP" in the last 168 hours. Iron Studies: No results for input(s): "IRON", "TIBC", "TRANSFERRIN", "FERRITIN" in the last 72 hours. @lablastinr3 @ Studies/Results: DG Chest 2 View  Result Date: 12/05/2022 CLINICAL DATA:  Shortness of breath. EXAM: CHEST - 2 VIEW COMPARISON:  11/28/2022 FINDINGS: Right-sided dialysis catheter remains in place. Stable heart size and mediastinal contours. Similar fine interstitial opacities suspicious for pulmonary edema. Bandlike areas of scarring in the lung bases, left greater than right, unchanged. No large pleural effusion or pneumothorax. Advanced vascular calcifications. IMPRESSION: No change from prior exam. Similar fine interstitial opacities suspicious for pulmonary edema. Electronically Signed   By: Narda Rutherford M.D.   On: 12/05/2022 21:17   Medications:  albumin human     anticoagulant sodium citrate     anticoagulant sodium citrate      acetaminophen  650 mg Oral TID WC & HS   amiodarone  200 mg Oral Daily   apixaban  2.5 mg Oral BID   bacitracin   Topical BID   Chlorhexidine Gluconate Cloth  6 each Topical Q12H   Chlorhexidine Gluconate Cloth  6 each Topical Q0600   darbepoetin (ARANESP) injection - DIALYSIS  100 mcg Subcutaneous Q Sat-1800   docusate sodium  100 mg Oral Daily   feeding supplement  237 mL Oral BID BM   gabapentin  300 mg Oral QHS   hydrocerin   Topical Daily   lidocaine  3 patch Transdermal Q24H   lidocaine   Topical TID   melatonin  3 mg Oral QHS   midodrine  15 mg Oral TID WC   multivitamin  1 tablet Oral QHS   polyethylene glycol  17 g Oral BID   senna-docusate  1 tablet Oral BID   sevelamer carbonate  4.8 g Oral TID WC    OP Dialysis Orders: TTS [facility] 4h  2/2 bath  66kg   TDC   no heparin (allergy)  lock TDC w/ citrate - No ESA or VDRA - Binder: Renvela pwdr 4.8g TID - Sensipar 180mg  every day at home  Assessment/Plan: # Acute hypoxic respiratory failure: In setting of missed HD, pna  w/ effusion. CXR with pleural effusion s/p chest tube 7/31 now removed. On 1L O2 now. Optimize volume status with HD    # PNA/ L parapneumonic pleural effusion: s/p thoracentesis with cultures. S/p IV abx course and finished with augmentin   #Left leg wound - s/p AKA 8/15   # ESRD: continue HD per TTS schedule.  UF as able with support of albumin.  Hypotension limits UF.  - note that he will need a lower EDW given the amputation.  For now would consider 50 kg as EDW and continue to challenge here and outpatient as needed - fluid restrictions are in place   #Heparin allergy - use citrate for cath lock.   #Hypotension/ volume:  He is on midodrine. UF as tolerated with HD    #Anemia of ESRD: Hgb 8.3. have increased aranesp to 100 mcg every Sat for the 8/31 dose onward.  Anticipate need to further increase   # Secondary HPTH: switched to sevelamer per pharm recs.  At one point cinacalcet was held due to hypocalcemia but then resumed at 90 mg daily (half of his normal dose).  Stopped cinacalcet again on 8/26 due to hypocalcemia.  Checking PTH with next labs. He is having trouble dealing with the texture of the renvela powder. Will try chewable velphoro 1 per meal.    # Hypocalcemia - see above - holding sensipar.  Improved after stopping sensipar.  May be able to start back at a lower dose if needed soon.   #A-fib: On amiodarone and eliquis - per primary team; has IVC filter   # H/o type B aortic dissection s/p repair (2004)    Dispo -He plans to relocate to [place], [place] after discharge.  Need to confirm that a new outpatient HD unit is in place prior to discharge.  As above, he would also like to make sure that transportation to/from dialysis is set up  Rogers Blocker, PA-C 12/06/2022, 10:10 AM  [facility] Pager: [phone]

## 2022-12-06 NOTE — Consult Note (Signed)
Palliative Medicine Inpatient Consult Note  Consulting Provider: Horton Chin, MD   Reason for consult:   Palliative Care Consult Services Palliative Medicine Consult  Reason for Consult? Goals of care discussion   12/06/2022  HPI:  Per intake H&P -->   Corey [name] is a 52 y.o. male with medical history significant of end-stage renal disease on hemodialysis TTS, paroxysmal atrial fibrillation on chronic Coumadin therapy, history of previous aortic dissection, peripheral vascular disease, history of osteoarthritis, history of neuropathy, who missed hemodialysis twice secondary to nausea vomiting and diarrhea.    Endured a lengthy hospitalization in the setting of L AKA. Has been at CIR to promote rehabilitation.   Palliative care has been asked to get involved to further discuss goals of care given the significant life change that he has endured with his amputation.   Clinical Assessment/Goals of Care:  *Please note that this is a verbal dictation therefore any spelling or grammatical errors are due to the "Dragon Medical One" system interpretation.  I have reviewed medical records including EPIC notes, labs and imaging, received report from bedside RN, assessed the patient who is sitting up in the wheelchair.   I met with Corey [name] to further discuss diagnosis prognosis, GOC, EOL wishes, disposition and options.   I introduced Palliative Medicine as specialized medical care for people living with serious illness. It focuses on providing relief from the symptoms and stress of a serious illness. The goal is to improve quality of life for both the patient and the family.  Medical History Review and Understanding:  Discussed Corey [name]'s ESRD on HD, P-AFIB, OA, aortic dissection, arthritis, HTN, neuropathy, L eye blind, and recent LLE AKA.   Social History:  Epic is from [place] originally.  He moved to [place] to be closer to his family members.  He shares he has 2 daughters 1  who lives in [place] and 1 who lives in [place].  He has 1 grandson who lives in [place] and he plans to move after this hospitalization to have an active role in his life.  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  He shares he is a man of faith.  Functional and Nutritional State:  Preceding hospitalization Corey [name] was able to help with self-care.  Advance Directives:  A detailed discussion was had today regarding advanced directives.  Corey [name] defers to his brother Corey [name] as his Management consultant at this time.   Code Status:  Concepts specific to code status, artifical feeding and hydration, continued IV antibiotics and rehospitalization was had.  The difference between a aggressive medical intervention path  and a palliative comfort care path for this patient at this time was had.   Encouraged patient/family to consider DNR/DNI status understanding evidenced based poor outcomes in similar hospitalized patient, as the cause of arrest is likely associated with advanced chronic/terminal illness rather than an easily reversible acute cardio-pulmonary event. I explained that DNR/DNI does not change the medical plan and it only comes into effect after a person has arrested (died).  It is a protective measure to keep [place] from harming the patient in their last moments of life.   Patients shared that he "wants to live" he is accepting of the risks associated with resuscitative efforts.   Discussion:  Corey [name] and I discussed his clinical state and the life change she is endured in the setting of his left AKA.  He shares that he was disappointed with  having to lose his leg though now is in the situation he is in and is dealing with it.  We discussed the idea of him moving which she does seem enthusiastic in regards to most especially being closer to his daughter and grandson.  Patient is aware of his current health and the possibility of an immanent death should he have  another aortic dissection event. He shares that it is something he lives with everyday though re-emphasizes that he "wants to live".  Patient's goals are to get to a point of independence again ideally with a prosthetic.  Discussed the importance of continued conversation with family and their  medical providers regarding overall plan of care and treatment options, ensuring decisions are within the context of the patients values and GOCs.  Decision Maker: Corey [name],Corey [name] (Brother) [phone] (Mobile)   SUMMARY OF RECOMMENDATIONS   Full Code/Full Scope of Care  Continue current care  Patient's goals are for improvement  Incremental Palliative care support  Code Status/Advance Care Planning: FULL CODE  Palliative Prophylaxis:  Aspiration, Bowel Regimen, Delirium Protocol, Frequent Pain Assessment, Oral Care, Palliative Wound Care, and Turn Reposition  Additional Recommendations (Limitations, Scope, Preferences): Continue current care  Psycho-social/Spiritual:  Desire for further Chaplaincy support: Yes Additional Recommendations: Education on disease trajectory   Prognosis: High 12 month mortality risk.   Discharge Planning: Discharge home - will travel to [place].   Vitals:   12/06/22 1419 12/06/22 1430  BP: 119/80 111/77  Pulse: (!) 58 (!) 57  Resp: 15 18  Temp:    SpO2: 99% 100%    Intake/Output Summary (Last 24 hours) at 12/06/2022 1436 Last data filed at 12/06/2022 1240 Gross per 24 hour  Intake 590 ml  Output --  Net 590 ml   Last Weight  Most recent update: 12/06/2022  2:09 PM    Weight  59 kg (130 lb 1.1 oz)            Gen:  Middled aged AA M in NAD HEENT: moist mucous membranes CV: Regular rate and rhythm PULM:  On RA, breathing is even and nonlabored ABD: soft/nontender  EXT: L AKA Neuro: Alert and oriented x3   PPS: 50%   This conversation/these recommendations were discussed with patient primary care team, Dr. Carlis Abbott  Billing based on MDM:  High  Problems Addressed: One acute or chronic illness or injury that poses a threat to life or bodily function  Amount and/or Complexity of Data: Category 3:Discussion of management or test interpretation with external physician/other qualified health care professional/appropriate source (not separately reported)  Risks: Decision regarding hospitalization or escalation of hospital care and Decision not to resuscitate or to de-escalate care because of poor prognosis ______________________________________________________ Lamarr Lulas [facility] Palliative Medicine Team Team Cell Phone: [phone] Please utilize secure chat with additional questions, if there is no response within 30 minutes please call the above phone number  Palliative Medicine Team providers are available by phone from 7am to 7pm daily and can be reached through the team cell phone.  Should this patient require assistance outside of these hours, please call the patient's attending physician.

## 2022-12-06 NOTE — Progress Notes (Signed)
Pt has been accepted at the [facility] [phone] [place], [place], [place] [address]) clinic at d/c from rehab. Pt will have a TTS 1:15 pm chair time. Pt can start on Tuesday, Sept 10 and will need to arrive at 12:30 pm to complete paperwork prior to treatment. Update provided to rehab CSW, nephrologist, and renal PA. Will f/u with pt tomorrow morning regarding arrangements due to pt heading to HD soon today. Will assist as needed.   Olivia Canter Renal Navigator [phone]

## 2022-12-06 NOTE — Progress Notes (Addendum)
   Palliative Medicine Inpatient Follow Up Note   The PMT has acknowledged the consultation for Corey [name].   Corey [name] is at dialysis this afternoon therefore the initial consult has not been completed.   Consult will be done once patient returns this late afternoon versus tomorrow morning.  No Charge ______________________________________________________________________________________ Corey [name] [facility] Palliative Medicine Team Team Cell Phone: [phone] Please utilize secure chat with additional questions, if there is no response within 30 minutes please call the above phone number  Palliative Medicine Team providers are available by phone from 7am to 7pm daily and can be reached through the team cell phone.  Should this patient require assistance outside of these hours, please call the patient's attending physician.

## 2022-12-06 NOTE — Progress Notes (Signed)
Physical Therapy Session Note  Patient Details  Name: Corey [name] MRN: [id number] Date of Birth: 14-Feb-1971  Today's Date: 12/06/2022 PT Individual Time:  1104-1200  PT Individual Time Calculation (min): 56 min  Short Term Goals: Week 1:  PT Short Term Goal 1 (Week 1): pt will perform bed mobility with supervision PT Short Term Goal 2 (Week 1): pt will transfer bed<>chair with LRAD and CGA PT Short Term Goal 3 (Week 1): pt will transfer sit<>stand with LRAD and min A   Skilled Therapeutic Interventions/Progress Updates:  Patient seated upright in w/c on entrance to room. Patient alert and agreeable to PT session.   Patient with pain complaint at LLE at start of session. Pt provided with education re: pain and need for brain retraining to actual vs perceived pain as brain is currently incorrectly interpreting pain signals from LE. Assisted pt with gentle touching/ tapping to all areas around LE including gentle taps to end of residual limb.   Therapeutic Activity: Transfers: Pt performed slide board transfer w/c <> mat table with light CGA/ close supervision following ModA for placement of board.   Pt's O2 watched throughout session with best readings from R thumb. Otherwise SpO2 readings jump from 60s to 90s. From R thumb, readings maintain above 90% during activity and >95% at rest on 1-2L of O2.   Pt guided in slow increase of WB to RLE with progressive rise of mat table with x5 seat push up from EOM at each rise to next position. Pt's leg positioning to near 130* with increased WB to L foot. Performs all with CGA to R knee to progress weight shift forward, increase tolerance to WB and progress rise to stand transfers.   Wheelchair Mobility:  Pt propelled wheelchair 20 feet with BUE only. Provided vc/ tc for hand positioning.  Patient seated upright in w/c at end of session with brakes locked, belt alarm set, and all needs within reach.   Therapy Documentation Precautions:   Precautions Precautions: Fall, Other (comment) Precaution Comments: watch BP; blind in L eye Restrictions Weight Bearing Restrictions: Yes LLE Weight Bearing: Non weight bearing  Pain:  Pain related at 9/10 at start of session, but does not indicate 9/10 functionally throughout session as pt is able to provide good effort in all activities throughout.   Therapy/Group: Individual Therapy  Loel Dubonnet PT, DPT, CSRS 12/06/2022, 10:34 AM

## 2022-12-06 NOTE — Progress Notes (Signed)
Physical Therapy Session Note  Patient Details  Name: Corey [name] MRN: [id number] Date of Birth: 07-26-1970  Today's Date: 12/06/2022 PT Individual Time: 0732-0843 PT Individual Time Calculation (min): 71 min   Short Term Goals: Week 1:  PT Short Term Goal 1 (Week 1): pt will perform bed mobility with supervision PT Short Term Goal 2 (Week 1): pt will transfer bed<>chair with LRAD and CGA PT Short Term Goal 3 (Week 1): pt will transfer sit<>stand with LRAD and min A  Skilled Therapeutic Interventions/Progress Updates:   Received pt sitting in WC, pt agreeable to PT treatment, and reported pain 8.5/10 in L residual limb - RN notified and present to administer pain medication. Pt on 2L with SPO2 96% initally. Session with emphasis on functional mobility/transfers, generalized strengthening and endurance, and limb loss education.   Pt performed WC mobility 165ft using BUE and supervision to main therapy gym - emphasis on UE strength. Attempted to titrate pt to 1L however SPO2 bouncing from 46% to 83% (unsure of accuracy as pt denied any SOB) - transitioned back to 2L and SPO2 increased to 95%. Pt transferred WC<>mat via lateral scoot with CGA and sit<>supine on wedge with supervision. Pt agreeable to try wearing shrinker - removed boxers and pants via bridging and rolling without assist. Donned grey shrinker with total A but unable to double layer due to increased pain. Pt then began feeling SOB and requested to sit up - semi-reclined<>sitting EOM with mod A and SPO2 dropped to 87%. Increased to 3L and SPO2 fluctuating from 88-98% with exercises (see below) and cues for pursed lip breathing. Retrieved higher wedge and pt transitioned back into supine on wedge with supervision (more comfortable with higher wedge). Pt performed the following exercises with emphasis on LE and core strength, ROM, and contracture prevention: -SLR 2x10 on LLE -hip abduction 2x10 on LLE -R single leg bridge 2x10 (unable to  perform to full ROM, resulting in pelvic tilts) -shoulder extensions into physioball 2x10 with 5 second isometric hold to activate abdominals Pt transferred semi-reclined<>sitting EOM with supervision and increased time/effort, then performed slideboard transfer mat<>WC with supervision. Transported back to room in [facility] dependently and concluded session with pt sitting in WC, needs within reach, and seatbelt alarm on.   Therapy Documentation Precautions:  Precautions Precautions: Fall, Other (comment) Precaution Comments: watch BP; blind in L eye Restrictions Weight Bearing Restrictions: Yes LLE Weight Bearing: Non weight bearing  Therapy/Group: Individual Therapy [name] [name] PT, DPT 12/06/2022, 6:48 AM

## 2022-12-06 NOTE — Progress Notes (Signed)
Patient ID: Corey [name], male   DOB: 04/13/1970, 52 y.o.   MRN: [id number]  Sw informed by Olivia Canter  Pt has been accepted at the [facility] clinic on TTS schedule. pt can start at that clinic on Tuesday, 9/10. With a d/c arranged for 9/8.

## 2022-12-06 NOTE — Progress Notes (Signed)
Received patient in bed to unit.  Alert and oriented.  Informed consent signed and in chart.   TX duration:3.5 hours  Patient tolerated well.  Transported back to the room  Alert, without acute distress.  Hand-off given to patient's nurse.   Access used: RHDCath Access issues: none  Total UF removed: Medication(s) given: Tramadol, Tylenol, midodrine, see MAR   12/06/22 1800  Vitals  Temp 98 F (36.7 C)  Temp Source Oral  BP 111/68  MAP (mmHg) 82  Pulse Rate 60  ECG Heart Rate 64  Resp 19  Level of Consciousness  Level of Consciousness Alert  MEWS COLOR  MEWS Score Color Green  Oxygen Therapy  SpO2 98 %  O2 Device Nasal Cannula  O2 Flow Rate (L/min) 2 L/min  Pain Assessment  Pain Scale 0-10  Pain Score 8  Pain Type Acute pain  Pain Location Leg  Pain Orientation Left  Pain Descriptors / Indicators Throbbing  Pain Frequency Occasional  Pain Onset Gradual  Pain Intervention(s) Other (Comment) (Scheduled Tylenol Already given, see MAR)  PCA/Epidural/Spinal Assessment  Respiratory Pattern Regular;Unlabored  ECG Monitoring  Cardiac Rhythm NSR  MEWS Score  MEWS Temp 0  MEWS Systolic 0  MEWS Pulse 0  MEWS RR 0  MEWS LOC 0  MEWS Score 0     Stacie Glaze LPN [facility]

## 2022-12-06 NOTE — Plan of Care (Signed)
  Problem: Consults Goal: RH LIMB LOSS PATIENT EDUCATION Description: Description: See Patient Education module for eduction specifics. Outcome: Progressing   Problem: RH BOWEL ELIMINATION Goal: RH STG MANAGE BOWEL WITH ASSISTANCE Description: STG Manage Bowel with Assistance. Outcome: Progressing   Problem: RH SKIN INTEGRITY Goal: RH STG SKIN FREE OF INFECTION/BREAKDOWN Outcome: Progressing Goal: RH STG ABLE TO PERFORM INCISION/WOUND CARE W/ASSISTANCE Description: STG Able To Perform Incision/Wound Care With Assistance. Outcome: Progressing

## 2022-12-06 NOTE — Progress Notes (Signed)
Occupational Therapy Session Note  Patient Details  Name: Corey [name] MRN: [id number] Date of Birth: 1970-10-02  Today's Date: 12/06/2022 OT Individual Time: 1005-1100 OT Individual Time Calculation (min): 55 min    Short Term Goals: Week 1:  OT Short Term Goal 1 (Week 1): Pt will complete LB dressing with CGA OT Short Term Goal 2 (Week 1): Pt will report < 5/10 pain for 2 consecutive sessions OT Short Term Goal 3 (Week 1): Pt will complete toileting with CGA  Skilled Therapeutic Interventions/Progress Updates:  Skilled OT intervention completed with focus on cardiovascular/BUE endurance. Pt received seated in w/c, agreeable to session. 9/10 pain reported in Lt residual limb; pre-medicated. OT offered rest breaks, repositioning, distraction with music throughout for pain reduction.  Pt frantically trying to find medical insurance card; needed assist to locate but able to find it and encouraged he keep in safe spot. Pt declined self-care needs. Nurse present for meds. Transported dependently in w/c <> gym for time.  Pt completed the following intervals while seated at the SCIFIT to promote global/BUE endurance needed for independence with BADLs and functional transfers: -3 min, level 2, forwards -3 min, level 4, forwards -3 min, level 4, backwards -3 min, level 5, backwards Extended rest breaks needed for fatigue and diaphragmatic breathing with below results for O2 sats. Utilized pt's preference of music for boosting moral.  Back in room, pt remained seated in w/c, with chair alarm on/activated, and with all needs in reach at end of session.  Vitals -Received on 2.5L via [place], SPO2 86% originally detected but pt with poor circulation to fingers therefore OT warmed them and got 97% reading and pt without distress or SOB -97% after activity on 2L -Attempted titration to 1L however pt unable to maintain 96% (vs 90%) or above per order therefore remained on 2L during activity    Therapy  Documentation Precautions:  Precautions Precautions: Fall, Other (comment) Precaution Comments: watch BP; blind in L eye Restrictions Weight Bearing Restrictions: Yes LLE Weight Bearing: Non weight bearing    Therapy/Group: Individual Therapy  Ahri Olson E Marilouise Densmore, MS, OTR/L  12/06/2022, 11:06 AM

## 2022-12-06 NOTE — Progress Notes (Signed)
PROGRESS NOTE   Subjective/Complaints: No new complaints this morning On the phone Palliative care consulted to discuss goals of care   ROS: +Phantom limb pain- continues, nosebleeds, +malaise Denies fevers, chills, N/V, abdominal pain, constipation, diarrhea, SOB, cough, chest pain, new weakness or paraesthesias.     Objective:   DG Chest 2 View  Result Date: 12/05/2022 CLINICAL DATA:  Shortness of breath. EXAM: CHEST - 2 VIEW COMPARISON:  11/28/2022 FINDINGS: Right-sided dialysis catheter remains in place. Stable heart size and mediastinal contours. Similar fine interstitial opacities suspicious for pulmonary edema. Bandlike areas of scarring in the lung bases, left greater than right, unchanged. No large pleural effusion or pneumothorax. Advanced vascular calcifications. IMPRESSION: No change from prior exam. Similar fine interstitial opacities suspicious for pulmonary edema. Electronically Signed   By: Narda Rutherford M.D.   On: 12/05/2022 21:17   Recent Labs    12/05/22 0637 12/06/22 1430  WBC 8.9 10.2  HGB 8.3* 8.4*  HCT 29.6* 30.1*  PLT 179 216   Recent Labs    12/05/22 0637  NA 138  K 4.8  CL 99  CO2 26  GLUCOSE 87  BUN 54*  CREATININE 6.29*  CALCIUM 9.4    Intake/Output Summary (Last 24 hours) at 12/06/2022 1500 Last data filed at 12/06/2022 1240 Gross per 24 hour  Intake 590 ml  Output --  Net 590 ml        Physical Exam: Vital Signs Blood pressure 111/77, pulse (!) 57, temperature 97.6 F (36.4 C), temperature source Oral, resp. rate 18, weight 59 kg, SpO2 100%. Gen: no distress, normal appearing, BMI is 13.78 Eyes: Pupils equal and round.  Looks in general direction.  Says can see shapes, but nothing clear  Cardiovascular:    Bradycardia    Heart sounds: Normal heart sounds. No murmur heard.    No gallop.  Pulmonary:     Effort: Pulmonary effort is normal. No respiratory distress.     Breath  sounds: No wheezing, rhonchi or rales.     Comments: Decreased at bases, otherwise clear to auscultation. Abdominal:     General: Abdomen is flat. There is no distension.     Palpations: Abdomen is soft.     Tenderness: There is no abdominal tenderness.  Musculoskeletal:        General: No swelling or tenderness.     Cervical back: Neck supple. No tenderness.     Comments: Amputation site well wrapped in figure-of-eight for Ace bandage, loose and dog-eared; rewrapped  Right foot with overgrown toe nails.   Ues 5-/5 B/L RLE- HF 4-/5; otherwise DF 0/5 and PF and KE 2-/5 LLE- L AKA- with HF 4-/5   Skin:     General: Skin is warm and dry.     Comments: Boggy area infra clavicular area--non tender and reported to be stable. Multiple grafts RUE--non functional. R-internal jugular noted. RLE with areas of discoloration and stasis changes.  R HD catheter looks OK 2 IV"s LUE- look OK  Leg/ L AKA- tip elevated on pillow-dressing with some dried blood, staples appear well-approximated and intact.  No apparent drainage or edema.  Healing well.  Neurological:  Mental Status: He is alert and oriented to person, place, and time.     Comments: Decreased to absent below R knee  Psychiatric:        Mood and Affect: Mood normal.        Behavior: Behavior normal.   Assessment/Plan: 1. Functional deficits which require 3+ hours per day of interdisciplinary therapy in a comprehensive inpatient rehab setting. Physiatrist is providing close team supervision and 24 hour management of active medical problems listed below. Physiatrist and rehab team continue to assess barriers to discharge/monitor patient progress toward functional and medical goals  Care Tool:  Bathing    Body parts bathed by patient: Right arm, Left arm, Chest, Abdomen, Front perineal area, Buttocks, Right upper leg, Left upper leg, Face, Right lower leg     Body parts n/a: Left lower leg   Bathing assist Assist Level:  Supervision/Verbal cueing     Upper Body Dressing/Undressing Upper body dressing   What is the patient wearing?: Pull over shirt    Upper body assist Assist Level: Minimal Assistance - Patient > 75%    Lower Body Dressing/Undressing Lower body dressing      What is the patient wearing?: Incontinence brief, Pants, Ace wrap/stump shrinker     Lower body assist Assist for lower body dressing: Moderate Assistance - Patient 50 - 74%     Toileting Toileting    Toileting assist Assist for toileting: Moderate Assistance - Patient 50 - 74%     Transfers Chair/bed transfer  Transfers assist  Chair/bed transfer activity did not occur: Safety/medical concerns  Chair/bed transfer assist level: Supervision/Verbal cueing (supervision)     Locomotion Ambulation   Ambulation assist   Ambulation activity did not occur: Safety/medical concerns (pain, fear of falling, decreased balance, protection of contralateral limb)          Walk 10 feet activity   Assist  Walk 10 feet activity did not occur: Safety/medical concerns (pain, fear of falling, decreased balance, protection of contralateral limb)        Walk 50 feet activity   Assist Walk 50 feet with 2 turns activity did not occur: Safety/medical concerns (pain, fear of falling, decreased balance, protection of contralateral limb)         Walk 150 feet activity   Assist Walk 150 feet activity did not occur: Safety/medical concerns (pain, fear of falling, decreased balance, protection of contralateral limb)         Walk 10 feet on uneven surface  activity   Assist Walk 10 feet on uneven surfaces activity did not occur: Safety/medical concerns (pain, fear of falling, decreased balance, protection of contralateral limb)         Wheelchair     Assist Is the patient using a wheelchair?: Yes Type of Wheelchair: Manual Wheelchair activity did not occur: Safety/medical concerns (pain)  Wheelchair  assist level: Supervision/Verbal cueing Max wheelchair distance: 159ft    Wheelchair 50 feet with 2 turns activity    Assist    Wheelchair 50 feet with 2 turns activity did not occur: Safety/medical concerns (pain)   Assist Level: Supervision/Verbal cueing   Wheelchair 150 feet activity     Assist  Wheelchair 150 feet activity did not occur: Safety/medical concerns (pain)       Blood pressure 111/77, pulse (!) 57, temperature 97.6 F (36.4 C), temperature source Oral, resp. rate 18, weight 59 kg, SpO2 100%.  Medical Problem List and Plan: 1. Functional deficits secondary to  L AKA secondary  to left foot wound 2/2 PAD             -patient may not shower due to R HD catheter             -ELOS/Goals: 12-14 days supervision Continue CIR, communicated plan with PT and OT  2.  Antithrombotics: -DVT/anticoagulation:  Pharmaceutical: Eliquis. IVC filter in place.              -antiplatelet therapy: N/A 3. Penile pain: topical bacitracin ordered  4. Insomnia complicated by phantom pain: d/c amitriptyline since not helping with his sleep and cannot increase further given cardiac history. Add melatonin 3mg  HS  -Continues to be limited by phantom pain with difficulty sleeping; already getting as needed Ambien, would not further dose increase sleep medications  5. Neuropsych/cognition: This patient is capable of making decisions on his own behalf. 6. Skin/Wound Care: Routine pressure relief measure 7. Fluids/Electrolytes/Nutrition:  Strict I/O. Daily weights. Diet changed to Renal per input from nephrology. 8. L-AKA: Monitor incision for healing. Dry dressing. Shrinkers ordered  -8-31, 9/1: Per patient, not tolerating shrinkers s, but tolerating figure-of-eight ace wrap.  Continue.  9. ESRD: Renal diet with 1200 cc FR/HD TTS at the end of the day to help with tolerance of therapy.  10. H/o AAA aneurysm rupture s/p repair:  with resultant paraplegia, sensory deficits from saddle  down and cauda equina syndrome             --was able to walk till a few years ago when left knee gave out.              --has severe LLQ pain prior to defecation.Continue Senna/miralax--discussed suppository every other day but patient wants to think on it "that's too much"  11. Anemia of chronic disease:] 12. L- PNA and parapneumonic effusion s/p CT: Has completed 2 week course of antibiotics.  13. Fluid overload: Continue to monitor for symptoms and wean oxygen as tolerated.   -Weights up to baseline on 9/2 Filed Weights   12/04/22 0600 12/05/22 0430 12/06/22 1409  Weight: 50 kg 54 kg 59 kg    14. Neurogenic bowel: Will monitor for now.  Last bowel movement 9/2, d/c bisacodyl suppository    15. Neuropathic pain- will change gabapentin to 300 mg at bedtime, decrease amitriptyline to 10mg  since not helping with sleep and we cannot increase further due to cardiac history  D/c amitriptyline since it is not helping and given cardiac risk factors  16. Leukocytosis: WBC reviewed and has normalized  17. Underweight: provide dietary education  18. Suboptimal vitamin D: will not start supplement due to allergy to doxecalciferol. Will order grounds pass so he can have sunlight exposure.   19. Chest pain, chronic: renal is aware, continue ultrafiltration with dialysis  20.  Overnight epistaxis.  Reordered oxygen to be humidified.  21. Malaise: oxygen reviewed and is satting well, weekend notes and nephrology notes reviewed, CXR and COVID test ordered to r/o infection  22. Bradycardia: continue to monitor HR TID    LOS: 7 days A FACE TO FACE EVALUATION WAS PERFORMED  Lithzy Bernard P Glendal Cassaday 12/06/2022, 3:00 PM

## 2022-12-07 LAB — PARATHYROID HORMONE, INTACT (NO CA)
PTH: 806 pg/mL — ABNORMAL HIGH (ref 15–65)
PTH: 1125 pg/mL — ABNORMAL HIGH (ref 15–65)

## 2022-12-07 MED ORDER — HYDROCORTISONE ACETATE 25 MG RE SUPP
25.0000 mg | Freq: Two times a day (BID) | RECTAL | Status: AC
  Administered 2022-12-07 – 2022-12-08 (×2): 25 mg via RECTAL
  Filled 2022-12-07 (×3): qty 1

## 2022-12-07 MED ORDER — CHLORHEXIDINE GLUCONATE CLOTH 2 % EX PADS
6.0000 | MEDICATED_PAD | Freq: Every day | CUTANEOUS | Status: DC
  Administered 2022-12-08 – 2022-12-10 (×3): 6 via TOPICAL

## 2022-12-07 NOTE — Progress Notes (Signed)
Advised by rehab CSW that pt's d/c plan has changed and pt to remain in [place] until end of Sept. Contacted [facility] admissions and [facility] to be made aware of this change and to inquire if pt's chair still available at [facility]. Awaiting a response.   Olivia Canter Renal Navigator [phone]

## 2022-12-07 NOTE — Progress Notes (Signed)
Patient ID: Corey [name], male   DOB: [birth year], 52 y.o.   MRN: [id number]  Team Conference Report to Patient/Family  Team Conference discussion was reviewed with the patient and caregiver, including goals, any changes in plan of care and target discharge date.  Patient and caregiver express understanding and are in agreement.  The patient has a target discharge date of 12/11/22.  Sw spoke with daughter, [name] at [phone] and provided team conference updates. Daughter informed HD switched to [facility] clinic. Sw will confirm with pt and daughter on the determination of 02 at home. Daughter expressed that it is now the plan is now for the patient to remain in [place] until the end of the month. Daughter prefers patient to remain in [place] until his insurance transitions to [facility]. Sw will update the team. Sw will call daughter back tomorrow to confirm updated changes and recommendations. Daughter will discuss recommendations and care needs with brother and pt's cousin.     Sw met with patient, brother and friend and provided team conference updates. SW informed pt of the new plan in place to remain in [place]. Patient aware. Brother at bedside expressed that he plans to assist patient scheduling [facility] [place] for HD at d/c. Brother plans to assist patient at d/c until transition to [facility]. No additional questions or concerns currently. Sw will FU with patient and daughter with changes and recommendations.  Andria Rhein 12/07/2022, 1:56 PM

## 2022-12-07 NOTE — Progress Notes (Signed)
[facility] Progress Note   Subjective:   Seen in room, reports he has been mildly SOB since yesterday. Denies CP, palpitations, nausea. Says he had some dizziness on HD yesterday.   Objective Vitals:   12/06/22 1804 12/06/22 2107 12/07/22 0416 12/07/22 0500  BP:  102/68 97/67   Pulse:   62   Resp:  18 17   Temp:  98.2 F (36.8 C) 98 F (36.7 C)   TempSrc:  Oral Oral   SpO2:   96%   Weight: 56 kg   55.8 kg   Physical Exam General: Alert male in NAD Heart:RRR, no murmurs, rubs or gallops Lungs: On O2 2L via [place], lungs CTA bilaterally Abdomen: Soft, non-distended, +BS Extremities: 1+ edema RLE Dialysis Access: R internal jugular [facility]  Additional Objective Labs: Basic Metabolic Panel: Recent Labs  Lab 12/03/22 1139 12/05/22 0637 12/06/22 1430  NA 140 138 141  K 4.6 4.8 5.3*  CL 101 99 98  CO2 27 26 24   GLUCOSE 93 87 93  BUN 40* 54* 70*  CREATININE 5.46* 6.29* 7.55*  CALCIUM 8.6* 9.4 9.6  PHOS 3.3 3.2 4.0   Liver Function Tests: Recent Labs  Lab 12/03/22 1139 12/05/22 0637 12/06/22 1430  ALBUMIN 2.9* 2.9* 2.9*   No results for input(s): "LIPASE", "AMYLASE" in the last 168 hours. CBC: Recent Labs  Lab 12/01/22 0437 12/03/22 1139 12/05/22 0637 12/06/22 1430  WBC 9.0 9.8 8.9 10.2  HGB 8.3* 9.0* 8.3* 8.4*  HCT 28.9* 31.5* 29.6* 30.1*  MCV 94.4 98.1 97.0 97.4  PLT 231 206 179 216   Blood Culture    Component Value Date/Time   SDES BLOOD LEFT ARM 11/03/2022 0855   SPECREQUEST  11/03/2022 0855    BOTTLES DRAWN AEROBIC AND ANAEROBIC Blood Culture adequate volume   CULT  11/03/2022 0855    NO GROWTH 5 DAYS Performed at [facility], [address] [place], [place] [address]    REPTSTATUS 11/08/2022 FINAL 11/03/2022 0855    Cardiac Enzymes: No results for input(s): "CKTOTAL", "CKMB", "CKMBINDEX", "TROPONINI" in the last 168 hours. CBG: No results for input(s): "GLUCAP" in the last 168 hours. Iron Studies: No results for  input(s): "IRON", "TIBC", "TRANSFERRIN", "FERRITIN" in the last 72 hours. @lablastinr3 @ Studies/Results: DG Chest 2 View  Result Date: 12/05/2022 CLINICAL DATA:  Shortness of breath. EXAM: CHEST - 2 VIEW COMPARISON:  11/28/2022 FINDINGS: Right-sided dialysis catheter remains in place. Stable heart size and mediastinal contours. Similar fine interstitial opacities suspicious for pulmonary edema. Bandlike areas of scarring in the lung bases, left greater than right, unchanged. No large pleural effusion or pneumothorax. Advanced vascular calcifications. IMPRESSION: No change from prior exam. Similar fine interstitial opacities suspicious for pulmonary edema. Electronically Signed   By: Narda Rutherford M.D.   On: 12/05/2022 21:17   Medications:  albumin human      acetaminophen  650 mg Oral TID WC & HS   amiodarone  200 mg Oral Daily   apixaban  2.5 mg Oral BID   bacitracin   Topical BID   Chlorhexidine Gluconate Cloth  6 each Topical Q12H   Chlorhexidine Gluconate Cloth  6 each Topical Q0600   darbepoetin (ARANESP) injection - DIALYSIS  100 mcg Subcutaneous Q Sat-1800   docusate sodium  100 mg Oral Daily   feeding supplement  237 mL Oral BID BM   gabapentin  300 mg Oral QHS   hydrocerin   Topical Daily   lidocaine  3 patch Transdermal  Q24H   lidocaine   Topical TID   melatonin  3 mg Oral QHS   midodrine  15 mg Oral TID WC   multivitamin  1 tablet Oral QHS   polyethylene glycol  17 g Oral BID   senna-docusate  1 tablet Oral BID   sucroferric oxyhydroxide  500 mg Oral TID WC    Outpatient Dialysis Orders: TTS [facility] 4h  2/2 bath  66kg   TDC   no heparin (allergy)  lock TDC w/ citrate - No ESA or VDRA - Binder: Renvela pwdr 4.8g TID - Sensipar 180mg  every day at home  Assessment/Plan: # Acute hypoxic respiratory failure: In setting of missed HD, pna w/ effusion. CXR with pleural effusion s/p chest tube 7/31 now removed. On 1L O2 now. Optimize volume status with HD    # PNA/ L  parapneumonic pleural effusion: s/p thoracentesis with cultures. S/p IV abx course and finished with augmentin   #Left leg wound - s/p AKA 8/15   # ESRD: continue HD per TTS schedule.  UF as able with support of albumin.  Hypotension limits UF.  - note that he will need a lower EDW given the amputation.  For now would consider ~50 kg as EDW  - fluid restrictions are in place   #Heparin allergy - use citrate for cath lock.   #Hypotension/ volume:  He is on midodrine. UF as tolerated with HD    #Anemia of ESRD: Hgb 8.3. have increased aranesp to 100 mcg every Sat for the 8/31 dose onward.  Anticipate need to further increase   # Secondary HPTH: switched to sevelamer per pharm recs.  At one point cinacalcet was held due to hypocalcemia but then resumed at 90 mg daily (half of his normal dose).  Stopped cinacalcet again on 8/26 due to hypocalcemia.  Checking PTH with next labs. He is having trouble dealing with the texture of the renvela powder. Will try chewable velphoro 1 per meal.    # Hypocalcemia - see above - holding sensipar.  Improved after stopping sensipar.  May be able to start back at a lower dose if needed soon.   #A-fib: On amiodarone and eliquis - per primary team; has IVC filter   # H/o type B aortic dissection s/p repair (2004)    Dispo -He plans to relocate to [place], [place] after discharge.  Need to confirm that a new outpatient HD unit is in place prior to discharge.  As above, he would also like to make sure that transportation to/from dialysis is set up  Rogers Blocker, PA-C 12/07/2022, 11:21 AM  [facility] Pager: [phone]

## 2022-12-07 NOTE — Patient Care Conference (Signed)
Inpatient RehabilitationTeam Conference and Plan of Care Update Date: 12/07/2022   Time: 11:32 AM    Patient Name: Corey [name]      Medical Record Number: [id number]  Date of Birth: 04-04-1971 Sex: Male         Room/Bed: 4W12C/4W12C-02 Payor Info: Payor: Advertising copywriter MEDICARE / Plan: Suan Halter DUAL COMPLETE / Product Type: *No Product type* /    Admit Date/Time:  11/29/2022  4:01 PM  Primary Diagnosis:  S/P AKA (above knee amputation), left [facility])  Hospital Problems: Principal Problem:   S/P AKA (above knee amputation), left (HCC) Active Problems:   ESRD on dialysis (HCC)   Protein-calorie malnutrition, severe   Debility    Expected Discharge Date: Expected Discharge Date: 12/11/22  Team Members Present: Physician leading conference: Dr. Sula Soda Social Worker Present: [name], BSW Nurse Present: Chana Bode, RN PT Present: Wynelle Link, PT OT Present: Roney Mans, OT SLP Present: Jeannie Done, SLP PPS Coordinator present : Edson Snowball, PT     Current Status/Progress Goal Weekly Team Focus  Bowel/Bladder   Pt is continent of bowel and anuric. Last BM 9/3   Remain continent of bowel   Assisting with toileting as needed    Swallow/Nutrition/ Hydration               ADL's   Supervision UB, Min/mod A LB, Mod A toileting   Supervision/mod I   Barriers- low endurance, not very complaint with wlimb care, supplemetnal O2 dependency, low vision during mobility    Mobility   bed mobility supervision using bed features, slideboard transfers CGA/supervision, WC mobility 152ft supervision   supervision/mod I  barriers: pain, lack of sleep, supplemental O2 requirements, global deconditioning/weakness, contralateral limb not in good condition, unable to tolerate wearing shrinker    Communication                Safety/Cognition/ Behavioral Observations               Pain   Pain 8/10 to sx site on left leg   Pain below  4/10   Continue assessing pain q dhift and as needed    Skin   Sx incision to left leg, dressing in place   Incision site will heal with no infection  Asssess skin q shift and PRN      Discharge Planning:  Discharging home with daughter in [place]. HD transfer to [facility]. Patient will require a PCP, transition of Medicaid to [facility] and reccomended contacting social security to iniciate Medicare.   Team Discussion: Patient with phantom pain; on max dosage of Neurontin for ESRD/HD patient. Limited by malaise and pain with hypotension, poor endurance and oxygen dependence with low vision.  Patient on target to meet rehab goals: yes, currently needs supervision for upper body care and min -mod assist for toileting. Completes slide - board transfers with CGA as not standing due to weakness and pain.   *See Care Plan and progress notes for long and short-term goals.   Revisions to Treatment Plan:  Palliative care consult Shrinker don/doff/wear training Mirror therapy TENs unit therapy   Teaching Needs: Safety, medications, skin care, transfers, toileting, etc.   Current Barriers to Discharge: Decreased caregiver support and Home enviroment access/layout  Possible Resolutions to Barriers: Family setting up HD in [place] with PCP and medicaid transfers application pending Patient plans to stay local until the end of the month before relocating to [place] DME: bariatric DABSC , amputee support pad, slide-board  Medical Summary Current Status: phantom limb pain, bradycardia  Barriers to Discharge: Medical stability  Barriers to Discharge Comments: phantom limb pain, bradycardia Possible Resolutions to Barriers/Weekly Focus: weaned off amitriptyline since was not helping and given cardiac risks, continue gabapentin, trial of tens/e-stim today, continue to monitor vitals   Continued Need for Acute Rehabilitation Level of Care: The patient requires daily medical management by a  physician with specialized training in physical medicine and rehabilitation for the following reasons: Direction of a multidisciplinary physical rehabilitation program to maximize functional independence : Yes Medical management of patient stability for increased activity during participation in an intensive rehabilitation regime.: Yes Analysis of laboratory values and/or radiology reports with any subsequent need for medication adjustment and/or medical intervention. : Yes   I attest that I was present, lead the team conference, and concur with the assessment and plan of the team.   Chana Bode B 12/07/2022, 7:33 PM

## 2022-12-07 NOTE — Progress Notes (Signed)
Physical Therapy Session Note  Patient Details  Name: Corey [name] MRN: [id number] Date of Birth: 03-05-1971  Today's Date: 12/07/2022 PT Individual Time: 1300-1415 PT Individual Time Calculation (min): 75 min   Short Term Goals: Week 1:  PT Short Term Goal 1 (Week 1): pt will perform bed mobility with supervision PT Short Term Goal 2 (Week 1): pt will transfer bed<>chair with LRAD and CGA PT Short Term Goal 3 (Week 1): pt will transfer sit<>stand with LRAD and min A  Skilled Therapeutic Interventions/Progress Updates: Pt presents sitting in w/c and agreeable to therapy.  Pt wheeled 100' w/ BUES before fatiguing.  PT completed remainder to small gym.  Pt unsure of what car will be D/C'd to.  Pt performed car transfer to 22" height in/out w/ close supervision and able to A w/ placement of SB.  Pt then performed same transfers to a 24.5" height w/ CGA/close sup.  O2 monitored during session on 2 LPM always > 96% w/ activity.  Pt performed UBE 3' forward and 3" backwards w/ rest break between 2/2 UE fatigue.  Pt performed w/o trunk support.  Pt performed seated shoulder flexion holding small T-ball w/ cues for watching ball overhead to improve trunk and C/spine strength.  Pt remains sitting in w/c w/ chair alarm on and all needs in reach.     Therapy Documentation Precautions:  Precautions Precautions: Fall, Other (comment) Precaution Comments: watch BP; blind in L eye Restrictions Weight Bearing Restrictions: Yes LLE Weight Bearing: Non weight bearing General:   Vital Signs:   Pain:9/10 Pain Assessment Pain Scale: 0-10 Pain Score: 4  Pain Type: Acute pain Pain Location: Leg Pain Orientation: Left Pain Descriptors / Indicators: Throbbing Pain Frequency: Intermittent Pain Onset: On-going Pain Intervention(s): Medication (See eMAR)    Therapy/Group: Individual Therapy  Lucio Edward 12/07/2022, 2:16 PM

## 2022-12-07 NOTE — Progress Notes (Signed)
Patient ID: Corey [name], male   DOB: 1970/06/10, 52 y.o.   MRN: [id number]  Drop arm commode and transfer board ordered through [employer].

## 2022-12-07 NOTE — Progress Notes (Signed)
PROGRESS NOTE   Subjective/Complaints: Continues to complain of phantom limb pain, discussed trial of tens unit and he is agreeable Tolerated therapy well today   ROS: +Phantom limb pain- continues, nosebleeds, +malaise Denies fevers, chills, N/V, abdominal pain, constipation, diarrhea, SOB, cough, chest pain, new weakness or paraesthesias.     Objective:   DG Chest 2 View  Result Date: 12/05/2022 CLINICAL DATA:  Shortness of breath. EXAM: CHEST - 2 VIEW COMPARISON:  11/28/2022 FINDINGS: Right-sided dialysis catheter remains in place. Stable heart size and mediastinal contours. Similar fine interstitial opacities suspicious for pulmonary edema. Bandlike areas of scarring in the lung bases, left greater than right, unchanged. No large pleural effusion or pneumothorax. Advanced vascular calcifications. IMPRESSION: No change from prior exam. Similar fine interstitial opacities suspicious for pulmonary edema. Electronically Signed   By: Narda Rutherford M.D.   On: 12/05/2022 21:17   Recent Labs    12/05/22 0637 12/06/22 1430  WBC 8.9 10.2  HGB 8.3* 8.4*  HCT 29.6* 30.1*  PLT 179 216   Recent Labs    12/05/22 0637 12/06/22 1430  NA 138 141  K 4.8 5.3*  CL 99 98  CO2 26 24  GLUCOSE 87 93  BUN 54* 70*  CREATININE 6.29* 7.55*  CALCIUM 9.4 9.6    Intake/Output Summary (Last 24 hours) at 12/07/2022 1123 Last data filed at 12/07/2022 0839 Gross per 24 hour  Intake 718 ml  Output 2500 ml  Net -1782 ml        Physical Exam: Vital Signs Blood pressure 97/67, pulse 62, temperature 98 F (36.7 C), temperature source Oral, resp. rate 17, weight 55.8 kg, SpO2 96%. Gen: no distress, normal appearing, BMI is 15.38 Eyes: Pupils equal and round.  Looks in general direction.  Says can see shapes, but nothing clear  Cardiovascular:    Bradycardia    Heart sounds: Normal heart sounds. No murmur heard.    No gallop.  Pulmonary:      Effort: Pulmonary effort is normal. No respiratory distress.     Breath sounds: No wheezing, rhonchi or rales.     Comments: Decreased at bases, otherwise clear to auscultation. Abdominal:     General: Abdomen is flat. There is no distension.     Palpations: Abdomen is soft.     Tenderness: There is no abdominal tenderness.  Musculoskeletal:        General: No swelling or tenderness.     Cervical back: Neck supple. No tenderness.     Comments: Amputation site well wrapped in figure-of-eight for Ace bandage, loose and dog-eared; rewrapped  Right foot with overgrown toe nails.   Ues 5-/5 B/L RLE- HF 4-/5; otherwise DF 0/5 and PF and KE 2-/5 LLE- L AKA- with HF 4-/5   Skin:     General: Skin is warm and dry.     Comments: Boggy area infra clavicular area--non tender and reported to be stable. Multiple grafts RUE--non functional. R-internal jugular noted. RLE with areas of discoloration and stasis changes.  R HD catheter looks OK 2 IV"s LUE- look OK  Leg/ L AKA- tip elevated on pillow-dressing with some dried blood, staples appear well-approximated and intact.  No apparent drainage or edema.  Healing well.  Neurological:     Mental Status: He is alert and oriented to person, place, and time.     Comments: Decreased to absent below R knee  Psychiatric:        Mood and Affect: Mood normal.        Behavior: Behavior normal.   Assessment/Plan: 1. Functional deficits which require 3+ hours per day of interdisciplinary therapy in a comprehensive inpatient rehab setting. Physiatrist is providing close team supervision and 24 hour management of active medical problems listed below. Physiatrist and rehab team continue to assess barriers to discharge/monitor patient progress toward functional and medical goals  Care Tool:  Bathing    Body parts bathed by patient: Right arm, Left arm, Chest, Abdomen, Front perineal area, Buttocks, Right upper leg, Left upper leg, Face, Right lower leg      Body parts n/a: Left lower leg   Bathing assist Assist Level: Supervision/Verbal cueing     Upper Body Dressing/Undressing Upper body dressing   What is the patient wearing?: Pull over shirt    Upper body assist Assist Level: Minimal Assistance - Patient > 75%    Lower Body Dressing/Undressing Lower body dressing      What is the patient wearing?: Incontinence brief, Pants, Ace wrap/stump shrinker     Lower body assist Assist for lower body dressing: Moderate Assistance - Patient 50 - 74%     Toileting Toileting    Toileting assist Assist for toileting: Moderate Assistance - Patient 50 - 74%     Transfers Chair/bed transfer  Transfers assist  Chair/bed transfer activity did not occur: Safety/medical concerns  Chair/bed transfer assist level: Supervision/Verbal cueing (supervision)     Locomotion Ambulation   Ambulation assist   Ambulation activity did not occur: Safety/medical concerns (pain, fear of falling, decreased balance, protection of contralateral limb)          Walk 10 feet activity   Assist  Walk 10 feet activity did not occur: Safety/medical concerns (pain, fear of falling, decreased balance, protection of contralateral limb)        Walk 50 feet activity   Assist Walk 50 feet with 2 turns activity did not occur: Safety/medical concerns (pain, fear of falling, decreased balance, protection of contralateral limb)         Walk 150 feet activity   Assist Walk 150 feet activity did not occur: Safety/medical concerns (pain, fear of falling, decreased balance, protection of contralateral limb)         Walk 10 feet on uneven surface  activity   Assist Walk 10 feet on uneven surfaces activity did not occur: Safety/medical concerns (pain, fear of falling, decreased balance, protection of contralateral limb)         Wheelchair     Assist Is the patient using a wheelchair?: Yes Type of Wheelchair: Manual Wheelchair activity  did not occur: Safety/medical concerns (pain)  Wheelchair assist level: Supervision/Verbal cueing Max wheelchair distance: 173ft    Wheelchair 50 feet with 2 turns activity    Assist    Wheelchair 50 feet with 2 turns activity did not occur: Safety/medical concerns (pain)   Assist Level: Supervision/Verbal cueing   Wheelchair 150 feet activity     Assist  Wheelchair 150 feet activity did not occur: Safety/medical concerns (pain)       Blood pressure 97/67, pulse 62, temperature 98 F (36.7 C), temperature source Oral, resp. rate 17, weight 55.8 kg, SpO2 96%.  Medical  Problem List and Plan: 1. Functional deficits secondary to  L AKA secondary to left foot wound 2/2 PAD             -patient may not shower due to R HD catheter             -ELOS/Goals: 12-14 days supervision Continue CIR, communicated plan with PT and OT  2.  Antithrombotics: -DVT/anticoagulation:  Pharmaceutical: Eliquis. IVC filter in place.              -antiplatelet therapy: N/A 3. Penile pain: topical bacitracin ordered  4. Insomnia complicated by phantom pain: d/c amitriptyline since not helping with his sleep and cannot increase further given cardiac history. Add melatonin 3mg  HS  -Continues to be limited by phantom pain with difficulty sleeping; already getting as needed Ambien, would not further dose increase sleep medications  5. Neuropsych/cognition: This patient is capable of making decisions on his own behalf. 6. Skin/Wound Care: Routine pressure relief measure 7. Fluids/Electrolytes/Nutrition:  Strict I/O. Daily weights. Diet changed to Renal per input from nephrology. 8. L-AKA: Monitor incision for healing. Dry dressing. Shrinkers ordered  -8-31, 9/1: Per patient, not tolerating shrinkers s, but tolerating figure-of-eight ace wrap.  Continue.  9. ESRD: Renal diet with 1200 cc FR/HD TTS at the end of the day to help with tolerance of therapy.  10. H/o AAA aneurysm rupture s/p repair:   with resultant paraplegia, sensory deficits from saddle down and cauda equina syndrome             --was able to walk till a few years ago when left knee gave out.              --has severe LLQ pain prior to defecation.Continue Senna/miralax--discussed suppository every other day but patient wants to think on it "that's too much"  11. Anemia of chronic disease:] 12. L- PNA and parapneumonic effusion s/p CT: Has completed 2 week course of antibiotics.  13. Fluid overload: Continue to monitor for symptoms and wean oxygen as tolerated.   -Weights up to baseline on 9/2 Filed Weights   12/06/22 1409 12/06/22 1804 12/07/22 0500  Weight: 59 kg 56 kg 55.8 kg    14. Neurogenic bowel: Will monitor for now.  Last bowel movement 9/2, d/c bisacodyl suppository    15. Neuropathic pain- will change gabapentin to 300 mg at bedtime, decrease amitriptyline to 10mg  since not helping with sleep and we cannot increase further due to cardiac history  D/c amitriptyline since it is not helping and given cardiac risk factors  Prescribing Home [employer] NexWave Stimulator Device and supplies as needed. IFC, NMES and TENS medically necessary Treatment Rx: Daily @ 30-40 minutes per treatment PRN. [employer] NexWave only, no substitutions. Treatment Goals: 1) To reduce and/or eliminate pain 2) To improve functional capacity and Activities of daily living 3) To reduce or prevent the need for oral medications 4) To improve circulation in the injured region 5) To decrease or prevent muscle spasm and muscle atrophy 6) To provide a self-management tool to the patient The patient has not sufficiently improved with conservative care. Numerous studies indexed by Medline and PubMed.gov have shown Neuromuscular, Interferential, and TENS stimulators to reduce pain, improve function, and reduce medication use in injured patients. Continued use of this evidence based, safe, drug free treatment is both reasonable and medically necessary at this  time.   16. Leukocytosis: WBC reviewed and has normalized  17. Underweight: provide dietary education  18. Suboptimal vitamin D: will  not start supplement due to allergy to doxecalciferol. Will order grounds pass so he can have sunlight exposure.   19. Chest pain, chronic: renal is aware, continue ultrafiltration with dialysis  20.  Overnight epistaxis.  Reordered oxygen to be humidified. Resolved  21. Malaise: improved, oxygen reviewed and is satting well, weekend notes and nephrology notes reviewed, CXR and COVID test ordered to r/o infection, discussed that both are negative for new pathology  22. Bradycardia: continue to monitor HR TID, improved  I spent >33mins performing patient care related activities, including face to face time, documentation time, med management, discussion of meds and lab orders with patient, and overall coordination of care.    LOS: 8 days A FACE TO FACE EVALUATION WAS PERFORMED  Clint Bolder P Whitt Auletta 12/07/2022, 11:23 AM

## 2022-12-07 NOTE — Progress Notes (Signed)
Occupational Therapy Session Note  Patient Details  Name: Corey [name] MRN: [id number] Date of Birth: Sep 23, 1970  Today's Date: 12/07/2022 OT Individual Time: 5643-3295 OT Individual Time Calculation (min): 56 min    Short Term Goals: Week 1:  OT Short Term Goal 1 (Week 1): Pt will complete LB dressing with CGA OT Short Term Goal 2 (Week 1): Pt will report < 5/10 pain for 2 consecutive sessions OT Short Term Goal 3 (Week 1): Pt will complete toileting with CGA  Skilled Therapeutic Interventions/Progress Updates:  Pt greeted seated in w/c, pt agreeable to OT intervention. Pt reports fatigue from earlier sessions. Education provided on residual limb care with pt reporting he had not changed his shrinker in 2 days. Pt able to doff shrinker with supervision but MIN cues for technique. Retrieved inspection mirror with education provided on checking incision daily for signs of infection. Pt able to state 2 signs of infection, pt reports he has a mirror that he can use at home to check residual limb. Additional education provided on importance of touching residual limb to decrease phantom pain/sensation.   Pt with non adherent on his incision, nurse entered to apply new dressing. Donned new shrinker iwht MIN A with pt needing assist to hold shrinker open while pt donned. Pt reports increased pain after donning with nurse providing pain meds.   Remainder of session focused on increasing overall global strength/endurance with pt using 2lb weighted bar to toss beach ball back and forth. Pt completed task for 4 mins total but needed a rest break after 1 min and again at 2 min [name]. Pt on 2L [facility] [place] with SpO2 99% after completing task.   Ended session with pt seated in w/c with family present and all needs within reach.   Therapy Documentation Precautions:  Precautions Precautions: Fall, Other (comment) Precaution Comments: watch BP; blind in L eye Restrictions Weight Bearing Restrictions: Yes LLE Weight  Bearing: Non weight bearing  Pain: Unrated pain reported in LLE, rest breaks, repositioning, deep breathing and pain meds utilized.    Therapy/Group: Individual Therapy  [name] 12/07/2022, 12:12 PM

## 2022-12-07 NOTE — Plan of Care (Signed)
  Problem: Consults Goal: RH LIMB LOSS PATIENT EDUCATION Description: Description: See Patient Education module for eduction specifics. Outcome: Progressing   Problem: RH BOWEL ELIMINATION Goal: RH STG MANAGE BOWEL WITH ASSISTANCE Description: STG Manage Bowel with Assistance. Outcome: Progressing   Problem: RH SKIN INTEGRITY Goal: RH STG SKIN FREE OF INFECTION/BREAKDOWN Outcome: Progressing Goal: RH STG ABLE TO PERFORM INCISION/WOUND CARE W/ASSISTANCE Description: STG Able To Perform Incision/Wound Care With Assistance. Outcome: Progressing   Problem: RH SAFETY Goal: RH STG ADHERE TO SAFETY PRECAUTIONS W/ASSISTANCE/DEVICE Description: STG Adhere to Safety Precautions With Assistance/Device. Outcome: Progressing   Problem: RH PAIN MANAGEMENT Goal: RH STG PAIN MANAGED AT OR BELOW PT'S PAIN GOAL Outcome: Progressing   Problem: RH KNOWLEDGE DEFICIT LIMB LOSS Goal: RH STG INCREASE KNOWLEDGE OF SELF CARE AFTER LIMB LOSS Outcome: Progressing

## 2022-12-07 NOTE — Progress Notes (Signed)
Physical Therapy Session Note  Patient Details  Name: Corey [name] MRN: [id number] Date of Birth: 19-Jun-1970  Today's Date: 12/07/2022 PT Individual Time: 0800-0827 + [id number] PT Individual Time Calculation (min): 27 min  + 43 min  Short Term Goals: Week 1:  PT Short Term Goal 1 (Week 1): pt will perform bed mobility with supervision PT Short Term Goal 2 (Week 1): pt will transfer bed<>chair with LRAD and CGA PT Short Term Goal 3 (Week 1): pt will transfer sit<>stand with LRAD and min A  Skilled Therapeutic Interventions/Progress Updates:      1st session: Direct handoff of care from NT with patient finishing up on the drop arm BSC. Pt agreeable to handoff and has no c/o pain. Assisted from [facility] to wheelchair with SB transfer (totalA for board placement) and CGA for transferring into wheelchair - pt with fair understanding of sequencing and setup, able to laterally lean sufficiently for board placement. Propelled himself with supervision in w/c from his room to ortho rehab gym, ~78ft. Completed Kinetron at wheelchair level with 30cm/sec resistance for x8 minutes with his RLE only. Played pt selected music to encourage mood, effort, and keep to cadence. Pt propelled himself back to his room with supervision and ended session seated in w/c with all needs met. Pt tolerated 2L O2 during treatment.    2nd session: Pt sitting in w/c on arrival - rep from [employer] present for trialing e-stim device on L AKA pain - pt needing assist for setup to roll his sweat pants and AKA shrinker above limb to apply e-stim pads. Pt completing seated there-ex at w/c level while e-stim being applied: -4# dumbbell 2x8 shoulder press -4# dumbbell 2x8 shoulder fly's -4# dumbbell 2x8 horizontal shoulder abduction/adduction -4# dumbbell 2x8 front shoulder raise -4# dumbbell 2x8 tricep ext *rest breaks as needed  Session concluded with patient sitting in w/c, on wall O2 at 2L. All needs met.    Therapy  Documentation Precautions:  Precautions Precautions: Fall, Other (comment) Precaution Comments: watch BP; blind in L eye Restrictions Weight Bearing Restrictions: Yes LLE Weight Bearing: Non weight bearing General:     Therapy/Group: Individual Therapy  Raylon Lamson P Anderia Lorenzo PT 12/07/2022, 7:59 AM

## 2022-12-07 NOTE — Progress Notes (Signed)
Occupational Therapy Weekly Progress Note  Patient Details  Name: Corey [name] MRN: [id number] Date of Birth: 19-Dec-1970  Beginning of progress report period: November 30, 2022 End of progress report period: December 07, 2022  Today's Date: 12/07/2022  Patient has met 0 of 3 short term goals.  Pt making gradual progress towards OT goals with pt currently requiring supervision for bathing from sink level, MIN A for UB/LB dressing, MOD A for 3/3 toileting tasks, supervision- MIN A for slideboard transfers. No family education set at this time.    Patient continues to demonstrate the following deficits: muscle weakness and acute pain, decreased cardiorespiratoy endurance and decreased oxygen support, and decreased standing balance, decreased balance strategies, and difficulty maintaining precautions and therefore will continue to benefit from skilled OT intervention to enhance overall performance with BADL and Reduce care partner burden.  Patient progressing toward long term goals..  Continue plan of care.  OT Short Term Goals Week 1:  OT Short Term Goal 1 (Week 1): Pt will complete LB dressing with CGA OT Short Term Goal 1 - Progress (Week 1): Progressing toward goal OT Short Term Goal 2 (Week 1): Pt will report < 5/10 pain for 2 consecutive sessions OT Short Term Goal 2 - Progress (Week 1): Progressing toward goal OT Short Term Goal 3 (Week 1): Pt will complete toileting with CGA OT Short Term Goal 3 - Progress (Week 1): Progressing toward goal Week 2:  OT Short Term Goal 1 (Week 2): LTG= STGs   Therapy Documentation Precautions:  Precautions Precautions: Fall, Other (comment) Precaution Comments: watch BP; blind in L eye Restrictions Weight Bearing Restrictions: Yes LLE Weight Bearing: Non weight bearing    Therapy/Group: Individual Therapy  [name] 12/07/2022, 12:44 PM

## 2022-12-07 NOTE — Progress Notes (Signed)
Pt called staff to the room to get off the [facility]. Inside the [facility] was a mucous blood clot. Pt stated he wasn't having any pain nor feeling any different. Wendi Maya notified. MD stated to give night time eliquis order and MD to place other orders

## 2022-12-07 NOTE — Progress Notes (Addendum)
Alerted of patient passing "quarter size" blood clot along with some mucous. RN stated patient reportedly had non-bloody bowel movement today. Reviewed flowsheet. NT charted red and brown color to stool this morning and "notified RN". I do not see any further documentation in regards to this.  RN stated on exam, no obvious external hemorrhoids seen. Patient without complaints. Suspect internal hemorrhoid. On Elquis for pAF. H and H yesterday stable. Aranesp dose adjusted per nephrology. Monitor for further bleeding. Anusol supp for 3 doses. Check CBC in AM.

## 2022-12-08 LAB — CBC
HCT: 31.9 % — ABNORMAL LOW (ref 39.0–52.0)
Hemoglobin: 9.1 g/dL — ABNORMAL LOW (ref 13.0–17.0)
MCH: 28 pg (ref 26.0–34.0)
MCHC: 28.5 g/dL — ABNORMAL LOW (ref 30.0–36.0)
MCV: 98.2 fL (ref 80.0–100.0)
Platelets: 203 10*3/uL (ref 150–400)
RBC: 3.25 MIL/uL — ABNORMAL LOW (ref 4.22–5.81)
RDW: 19.3 % — ABNORMAL HIGH (ref 11.5–15.5)
WBC: 8.2 10*3/uL (ref 4.0–10.5)
nRBC: 0.4 % — ABNORMAL HIGH (ref 0.0–0.2)

## 2022-12-08 LAB — RENAL FUNCTION PANEL
Albumin: 3.1 g/dL — ABNORMAL LOW (ref 3.5–5.0)
Anion gap: 15 (ref 5–15)
BUN: 53 mg/dL — ABNORMAL HIGH (ref 6–20)
CO2: 23 mmol/L (ref 22–32)
Calcium: 9.4 mg/dL (ref 8.9–10.3)
Chloride: 97 mmol/L — ABNORMAL LOW (ref 98–111)
Creatinine, Ser: 6.31 mg/dL — ABNORMAL HIGH (ref 0.61–1.24)
GFR, Estimated: 10 mL/min — ABNORMAL LOW (ref >60)
Glucose, Bld: 87 mg/dL (ref 70–99)
Phosphorus: 3.5 mg/dL (ref 2.5–4.6)
Potassium: 4.5 mmol/L (ref 3.5–5.1)
Sodium: 135 mmol/L (ref 135–145)

## 2022-12-08 MED ORDER — ANTICOAGULANT SODIUM CITRATE 4% (200MG/5ML) IV SOLN
5.0000 mL | Freq: Once | Status: AC
  Administered 2022-12-08: 5 mL
  Filled 2022-12-08: qty 5

## 2022-12-08 MED ORDER — CINACALCET HCL 30 MG PO TABS
30.0000 mg | ORAL_TABLET | Freq: Every day | ORAL | Status: DC
  Administered 2022-12-09 – 2022-12-13 (×5): 30 mg via ORAL
  Filled 2022-12-08 (×7): qty 1

## 2022-12-08 NOTE — Plan of Care (Signed)
  Problem: Consults Goal: RH LIMB LOSS PATIENT EDUCATION Description: Description: See Patient Education module for eduction specifics. Outcome: Progressing   Problem: RH BOWEL ELIMINATION Goal: RH STG MANAGE BOWEL WITH ASSISTANCE Description: STG Manage Bowel with Assistance. Outcome: Progressing   Problem: RH SKIN INTEGRITY Goal: RH STG SKIN FREE OF INFECTION/BREAKDOWN Outcome: Progressing Goal: RH STG ABLE TO PERFORM INCISION/WOUND CARE W/ASSISTANCE Description: STG Able To Perform Incision/Wound Care With Assistance. Outcome: Progressing   Problem: RH SAFETY Goal: RH STG ADHERE TO SAFETY PRECAUTIONS W/ASSISTANCE/DEVICE Description: STG Adhere to Safety Precautions With Assistance/Device. Outcome: Progressing   Problem: RH PAIN MANAGEMENT Goal: RH STG PAIN MANAGED AT OR BELOW PT'S PAIN GOAL Outcome: Progressing   Problem: RH KNOWLEDGE DEFICIT LIMB LOSS Goal: RH STG INCREASE KNOWLEDGE OF SELF CARE AFTER LIMB LOSS Outcome: Progressing   Problem: Education: Goal: Knowledge of the prescribed therapeutic regimen will improve Outcome: Progressing Goal: Ability to verbalize activity precautions or restrictions will improve Outcome: Progressing Goal: Understanding of discharge needs will improve Outcome: Progressing   Problem: Activity: Goal: Ability to perform//tolerate increased activity and mobilize with assistive devices will improve Outcome: Progressing   Problem: Clinical Measurements: Goal: Postoperative complications will be avoided or minimized Outcome: Progressing   Problem: Self-Care: Goal: Ability to meet self-care needs will improve Outcome: Progressing   Problem: Self-Concept: Goal: Ability to maintain and perform role responsibilities to the fullest extent possible will improve Outcome: Progressing   Problem: Pain Management: Goal: Pain level will decrease with appropriate interventions Outcome: Progressing   Problem: Skin Integrity: Goal:  Demonstration of wound healing without infection will improve Outcome: Progressing   Problem: Education: Goal: Knowledge of General Education information will improve Description: Including pain rating scale, medication(s)/side effects and non-pharmacologic comfort measures Outcome: Progressing   Problem: Health Behavior/Discharge Planning: Goal: Ability to manage health-related needs will improve Outcome: Progressing   Problem: Clinical Measurements: Goal: Ability to maintain clinical measurements within normal limits will improve Outcome: Progressing Goal: Will remain free from infection Outcome: Progressing Goal: Diagnostic test results will improve Outcome: Progressing Goal: Respiratory complications will improve Outcome: Progressing Goal: Cardiovascular complication will be avoided Outcome: Progressing   Problem: Activity: Goal: Risk for activity intolerance will decrease Outcome: Progressing   Problem: Nutrition: Goal: Adequate nutrition will be maintained Outcome: Progressing   Problem: Coping: Goal: Level of anxiety will decrease Outcome: Progressing   Problem: Elimination: Goal: Will not experience complications related to bowel motility Outcome: Progressing Goal: Will not experience complications related to urinary retention Outcome: Progressing

## 2022-12-08 NOTE — Progress Notes (Signed)
[facility] Progress Note   Subjective:   Seen in room, reports he is having some pleuritic pain with deep breaths on the L side and is having L sided leg pain. Reports stable SOB. Denies dizziness and nausea.   Objective Vitals:   12/07/22 1940 12/07/22 2214 12/08/22 0422 12/08/22 0601  BP: 118/71   104/64  Pulse: 61   64  Resp: 16   16  Temp: 97.8 F (36.6 C)   98.8 F (37.1 C)  TempSrc: Oral   Oral  SpO2: 100%   94%  Weight:   54 kg   Height:  6\' 3"  (1.905 m)     Physical Exam General: Alert male in NAD Heart: RRR, no murmurs, rubs or gallops Lungs: + crackles LLL. On O2 2L via [place], respirations unlabored Abdomen: Soft, non-distended, +BS Extremities: 1+ edema LLE Dialysis Access: R internal jugular [facility]  Additional Objective Labs: Basic Metabolic Panel: Recent Labs  Lab 12/03/22 1139 12/05/22 0637 12/06/22 1430  NA 140 138 141  K 4.6 4.8 5.3*  CL 101 99 98  CO2 27 26 24   GLUCOSE 93 87 93  BUN 40* 54* 70*  CREATININE 5.46* 6.29* 7.55*  CALCIUM 8.6* 9.4 9.6  PHOS 3.3 3.2 4.0   Liver Function Tests: Recent Labs  Lab 12/03/22 1139 12/05/22 0637 12/06/22 1430  ALBUMIN 2.9* 2.9* 2.9*   No results for input(s): "LIPASE", "AMYLASE" in the last 168 hours. CBC: Recent Labs  Lab 12/03/22 1139 12/05/22 0637 12/06/22 1430  WBC 9.8 8.9 10.2  HGB 9.0* 8.3* 8.4*  HCT 31.5* 29.6* 30.1*  MCV 98.1 97.0 97.4  PLT 206 179 216   Blood Culture    Component Value Date/Time   SDES BLOOD LEFT ARM 11/03/2022 0855   SPECREQUEST  11/03/2022 0855    BOTTLES DRAWN AEROBIC AND ANAEROBIC Blood Culture adequate volume   CULT  11/03/2022 0855    NO GROWTH 5 DAYS Performed at [facility], [address], [place], [place] [address]    REPTSTATUS 11/08/2022 FINAL 11/03/2022 0855    Cardiac Enzymes: No results for input(s): "CKTOTAL", "CKMB", "CKMBINDEX", "TROPONINI" in the last 168 hours. CBG: No results for input(s): "GLUCAP" in the last 168  hours. Iron Studies: No results for input(s): "IRON", "TIBC", "TRANSFERRIN", "FERRITIN" in the last 72 hours. @lablastinr3 @ Studies/Results: No results found. Medications:  albumin human      acetaminophen  650 mg Oral TID WC & HS   amiodarone  200 mg Oral Daily   apixaban  2.5 mg Oral BID   bacitracin   Topical BID   Chlorhexidine Gluconate Cloth  6 each Topical Q12H   Chlorhexidine Gluconate Cloth  6 each Topical Q0600   Chlorhexidine Gluconate Cloth  6 each Topical Q0600   darbepoetin (ARANESP) injection - DIALYSIS  100 mcg Subcutaneous Q Sat-1800   docusate sodium  100 mg Oral Daily   feeding supplement  237 mL Oral BID BM   gabapentin  300 mg Oral QHS   hydrocerin   Topical Daily   hydrocortisone  25 mg Rectal BID   lidocaine  3 patch Transdermal Q24H   lidocaine   Topical TID   melatonin  3 mg Oral QHS   midodrine  15 mg Oral TID WC   multivitamin  1 tablet Oral QHS   polyethylene glycol  17 g Oral BID   senna-docusate  1 tablet Oral BID   sucroferric oxyhydroxide  500 mg Oral TID WC  Outpatient Dialysis Orders: TTS [facility] 4h  2/2 bath  66kg   TDC   no heparin (allergy)  lock TDC w/ citrate - No ESA or VDRA - Binder: Renvela pwdr 4.8g TID - Sensipar 180mg  every day at home  Assessment/Plan: # Acute hypoxic respiratory failure: In setting of missed HD, pna w/ effusion. CXR with pleural effusion s/p chest tube 7/31 now removed. He appears to be getting volume overloaded again, UF with HD as tolerated with albumin for BP support   # PNA/ L parapneumonic pleural effusion: s/p thoracentesis with cultures. S/p IV abx course and finished with augmentin   #Left leg wound - s/p AKA 8/15   # ESRD: continue HD per TTS schedule.  UF as able with support of albumin.  Hypotension limits UF.  - note that he will need a lower EDW given the amputation.  For now would consider ~50 kg as EDW  - fluid restrictions are in place   #Heparin allergy - use citrate for cath lock.    #Hypotension/ volume:  He is on midodrine. UF as tolerated with HD    #Anemia of ESRD: Hgb 8.4. have increased aranesp to 100 mcg every Sat for the 8/31 dose onward.  Anticipate need to further increase   # Secondary HPTH: switched to sevelamer per pharm recs.  At one point cinacalcet was held due to hypocalcemia but then resumed at 90 mg daily (half of his normal dose).  Stopped cinacalcet again on 8/26 due to hypocalcemia, Calcium now slightly high and PTH is 1125. Will restart sensipar 30mg  for now and follow trend. He was having trouble dealing with the texture of the renvela powder. Trying chewable velphoro 1 per meal.    # Hypocalcemia - see above - held sensipar, resolved, restarting at lower dose   #A-fib: On amiodarone and eliquis - per primary team; has IVC filter   # H/o type B aortic dissection s/p repair (2004)      Rogers Blocker, PA-C 12/08/2022, 8:57 AM  [facility] Pager: [phone]

## 2022-12-08 NOTE — Progress Notes (Signed)
PROGRESS NOTE   Subjective/Complaints: No new complaints  Feels he would benefit from staying here longer than Sunday to get stronger, messaged team to discuss   ROS: +Phantom limb pain- continues, nosebleeds, +malaise Denies fevers, chills, N/V, abdominal pain, constipation, diarrhea, SOB, cough, chest pain, new weakness or paraesthesias.  +bloody clots with stool last night   Objective:   No results found. Recent Labs    12/06/22 1430  WBC 10.2  HGB 8.4*  HCT 30.1*  PLT 216   Recent Labs    12/06/22 1430  NA 141  K 5.3*  CL 98  CO2 24  GLUCOSE 93  BUN 70*  CREATININE 7.55*  CALCIUM 9.6    Intake/Output Summary (Last 24 hours) at 12/08/2022 1147 Last data filed at 12/07/2022 1752 Gross per 24 hour  Intake 484 ml  Output --  Net 484 ml        Physical Exam: Vital Signs Blood pressure 104/64, pulse 64, temperature 98.8 F (37.1 C), temperature source Oral, resp. rate 16, height 6\' 3"  (1.905 m), weight 54 kg, SpO2 94%. Gen: no distress, normal appearing, BMI is 14.88 Eyes: Pupils equal and round.  Looks in general direction.  Says can see shapes, but nothing clear  Cardiovascular:    Bradycardia    Heart sounds: Normal heart sounds. No murmur heard.    No gallop.  Pulmonary:     Effort: Pulmonary effort is normal. No respiratory distress.     Breath sounds: No wheezing, rhonchi or rales.     Comments: Decreased at bases, otherwise clear to auscultation. Abdominal:     General: Abdomen is flat. There is no distension.     Palpations: Abdomen is soft.     Tenderness: There is no abdominal tenderness.  Musculoskeletal:        General: No swelling or tenderness.     Cervical back: Neck supple. No tenderness.     Comments: Amputation site well wrapped in figure-of-eight for Ace bandage, loose and dog-eared; rewrapped  Right foot with overgrown toe nails.   Ues 5-/5 B/L RLE- HF 4-/5; otherwise DF 0/5  and PF and KE 2-/5 LLE- L AKA- with HF 4-/5  GU: no bloody clots in stool this morning Skin:     General: Skin is warm and dry.     Comments: Boggy area infra clavicular area--non tender and reported to be stable. Multiple grafts RUE--non functional. R-internal jugular noted. RLE with areas of discoloration and stasis changes.  R HD catheter looks OK 2 IV"s LUE- look OK  Leg/ L AKA- tip elevated on pillow-dressing with some dried blood, staples appear well-approximated and intact.  No apparent drainage or edema.  Healing well.  Neurological:     Mental Status: He is alert and oriented to person, place, and time.     Comments: Decreased to absent below R knee  Psychiatric:        Mood and Affect: Mood normal.        Behavior: Behavior normal.   Assessment/Plan: 1. Functional deficits which require 3+ hours per day of interdisciplinary therapy in a comprehensive inpatient rehab setting. Physiatrist is providing close team supervision and 24  hour management of active medical problems listed below. Physiatrist and rehab team continue to assess barriers to discharge/monitor patient progress toward functional and medical goals  Care Tool:  Bathing    Body parts bathed by patient: Right arm, Left arm, Chest, Abdomen, Face (UB bathing only)     Body parts n/a: Front perineal area, Buttocks, Right upper leg, Left upper leg, Right lower leg, Left lower leg   Bathing assist Assist Level: Supervision/Verbal cueing     Upper Body Dressing/Undressing Upper body dressing   What is the patient wearing?: Pull over shirt    Upper body assist Assist Level: Set up assist    Lower Body Dressing/Undressing Lower body dressing      What is the patient wearing?: Ace wrap/stump shrinker     Lower body assist Assist for lower body dressing: Minimal Assistance - Patient > 75%     Toileting Toileting    Toileting assist Assist for toileting: Moderate Assistance - Patient 50 - 74%      Transfers Chair/bed transfer  Transfers assist  Chair/bed transfer activity did not occur: Safety/medical concerns  Chair/bed transfer assist level: Supervision/Verbal cueing (slideboard)     Locomotion Ambulation   Ambulation assist   Ambulation activity did not occur: Safety/medical concerns (pain, fear of falling, decreased balance, protection of contralateral limb)          Walk 10 feet activity   Assist  Walk 10 feet activity did not occur: Safety/medical concerns (pain, fear of falling, decreased balance, protection of contralateral limb)        Walk 50 feet activity   Assist Walk 50 feet with 2 turns activity did not occur: Safety/medical concerns (pain, fear of falling, decreased balance, protection of contralateral limb)         Walk 150 feet activity   Assist Walk 150 feet activity did not occur: Safety/medical concerns (pain, fear of falling, decreased balance, protection of contralateral limb)         Walk 10 feet on uneven surface  activity   Assist Walk 10 feet on uneven surfaces activity did not occur: Safety/medical concerns (pain, fear of falling, decreased balance, protection of contralateral limb)         Wheelchair     Assist Is the patient using a wheelchair?: Yes Type of Wheelchair: Manual Wheelchair activity did not occur: Safety/medical concerns (pain)  Wheelchair assist level: Supervision/Verbal cueing Max wheelchair distance: 100    Wheelchair 50 feet with 2 turns activity    Assist    Wheelchair 50 feet with 2 turns activity did not occur: Safety/medical concerns (pain)   Assist Level: Supervision/Verbal cueing   Wheelchair 150 feet activity     Assist  Wheelchair 150 feet activity did not occur: Safety/medical concerns (pain)       Blood pressure 104/64, pulse 64, temperature 98.8 F (37.1 C), temperature source Oral, resp. rate 16, height 6\' 3"  (1.905 m), weight 54 kg, SpO2 94%.  Medical  Problem List and Plan: 1. Functional deficits secondary to  L AKA secondary to left foot wound 2/2 PAD             -patient may not shower due to R HD catheter             -ELOS/Goals: 12-14 days supervision Continue CIR, communicated plan with PT and OT Messaged team regarding patient's desire for extension of stay to get stronger  2.  Antithrombotics: -DVT/anticoagulation:  Pharmaceutical: Continue Eliquis. IVC filter in place.              -  antiplatelet therapy: N/A  3. Penile pain: topical bacitracin ordered, continue  4. Insomnia complicated by phantom pain: d/c amitriptyline since not helping with his sleep and cannot increase further given cardiac history. Add melatonin 3mg  HS  -Continues to be limited by phantom pain with difficulty sleeping; already getting as needed Ambien, would not further dose increase sleep medications  5. Neuropsych/cognition: This patient is capable of making decisions on his own behalf. 6. Skin/Wound Care: Routine pressure relief measure 7. Fluids/Electrolytes/Nutrition:  Strict I/O. Daily weights. Diet changed to Renal per input from nephrology. 8. L-AKA: Monitor incision for healing. Dry dressing. Shrinkers ordered  -8-31, 9/1: Per patient, not tolerating shrinkers s, but tolerating figure-of-eight ace wrap.  Continue.  9. ESRD: Renal diet with 1200 cc FR/HD TTS at the end of the day to help with tolerance of therapy.  10. H/o AAA aneurysm rupture s/p repair:  with resultant paraplegia, sensory deficits from saddle down and cauda equina syndrome             --was able to walk till a few years ago when left knee gave out.              --has severe LLQ pain prior to defecation.Continue Senna/miralax--discussed suppository every other day but patient wants to think on it "that's too much"  11. Anemia of chronic disease:] 12. L- PNA and parapneumonic effusion s/p CT: Has completed 2 week course of antibiotics.  13. Fluid overload: Continue to monitor for  symptoms and wean oxygen as tolerated.   -Weights up to baseline on 9/2 Filed Weights   12/06/22 1804 12/07/22 0500 12/08/22 0422  Weight: 56 kg 55.8 kg 54 kg    14. Neurogenic bowel: Will monitor for now.  Last bowel movement 9/2, d/c bisacodyl suppository    15. Neuropathic pain- will change gabapentin to 300 mg at bedtime, decrease amitriptyline to 10mg  since not helping with sleep and we cannot increase further due to cardiac history  D/c amitriptyline since it is not helping and given cardiac risk factors  Prescribing Home Zynex NexWave Stimulator Device and supplies as needed. IFC, NMES and TENS medically necessary Treatment Rx: Daily @ 30-40 minutes per treatment PRN. Zynex NexWave only, no substitutions. Treatment Goals: 1) To reduce and/or eliminate pain 2) To improve functional capacity and Activities of daily living 3) To reduce or prevent the need for oral medications 4) To improve circulation in the injured region 5) To decrease or prevent muscle spasm and muscle atrophy 6) To provide a self-management tool to the patient The patient has not sufficiently improved with conservative care. Numerous studies indexed by Medline and PubMed.gov have shown Neuromuscular, Interferential, and TENS stimulators to reduce pain, improve function, and reduce medication use in injured patients. Continued use of this evidence based, safe, drug free treatment is both reasonable and medically necessary at this time.   16. Leukocytosis: WBC reviewed and has normalized  17. Underweight: provide dietary education  18. Suboptimal vitamin D: will not start supplement due to allergy to doxecalciferol. Will order grounds pass so he can have sunlight exposure.   19. Chest pain, chronic: renal is aware, continue ultrafiltration with dialysis  20.  Overnight epistaxis.  Reordered oxygen to be humidified. Resolved  21. Malaise: improved, oxygen reviewed and is satting well, weekend notes and nephrology notes  reviewed, CXR and COVID test ordered to r/o infection, discussed that both are negative for new pathology  22. Bradycardia: continue to monitor HR TID, resolved  23. Bloody  clots in stool: anusol ordered, discussed to let us know if happens again  LOS: 9 days A FACE TO FACE EVALUATION WAS PERFORMED  Drema Pry Theodosia Bahena 12/08/2022, 11:47 AM

## 2022-12-08 NOTE — Progress Notes (Signed)
Patient ID: Corey [name], male   DOB: May 01, 1970, 52 y.o.   MRN: [id number]  Patient d/c moved to 9/11.

## 2022-12-08 NOTE — Progress Notes (Signed)
Occupational Therapy Session Note  Patient Details  Name: Corey [name] MRN: [id number] Date of Birth: 12/03/1970  Today's Date: 12/08/2022 OT Individual Time: 4098-1191 OT Individual Time Calculation (min): 60 min    Short Term Goals: Week 2:  OT Short Term Goal 1 (Week 2): LTG= STGs  Skilled Therapeutic Interventions/Progress Updates:    OT intervention with focus on UB bathing/dressing and grooming at w/c level to increase pt's independence with BADLs. O2 sats >90% on 2L O2 throughout session. Pt completed UB bathing/dressing tasks at w/c level with supervision/setup assist 2/2 visual deficits. Pt requires rest breaks throughout session and commented on how "exhausted" he was after the "simple tasks." Pt requires more then a reasonable amount of time to complete tasks. Pt remained in w/c with belt alarm activated. All needs within reach.   Therapy Documentation Precautions:  Precautions Precautions: Fall, Other (comment) Precaution Comments: watch BP; blind in L eye Restrictions Weight Bearing Restrictions: Yes LLE Weight Bearing: Non weight bearing Pain: Pt reports increased pain in Lt torso/flank radiating into LLE (unrated); Pt reports he just received pain meds   Therapy/Group: Individual Therapy  Rich Brave 12/08/2022, 9:35 AM

## 2022-12-08 NOTE — Progress Notes (Signed)
Received patient in bed to unit.  Alert and oriented.  Informed consent signed and in chart.   TX duration:2.16.Marland KitchenMarland KitchenPatient self terminated session, AMA paperwork signed.  PA Collins informed.  Patient tolerated well.  Transported back to the room  Alert, without acute distress.  Hand-off given to patient's nurse.   Access used: R HD Cath Access issues: none  Total UF removed: Medication(s) given: Tramadol, Albumin, midodrine, see MAR   12/08/22 1837  Vitals  Temp 97.9 F (36.6 C)  Temp Source Oral  BP 99/63  MAP (mmHg) 74  Pulse Rate 65  ECG Heart Rate 64  Resp (!) 22  Oxygen Therapy  SpO2 97 %  O2 Device Nasal Cannula  O2 Flow Rate (L/min) 2 L/min  During Treatment Monitoring  Duration of HD Treatment -hour(s) 2.26 hour(s)  Cumulative Fluid Removed (mL) per Treatment  1570.07  HD Safety Checks Performed Yes  Intra-Hemodialysis Comments  (Patient erminated Dialysis session, AMA paperwork signed.)  Hemodialysis Catheter Right Subclavian Double-lumen  Placement Date/Time: 12/09/15 0900   Maximum sterile barrier precautions: Hand hygiene;Mask  Site Prep: Chlorhexidine  Orientation: Right  Access Location: Subclavian  Hemodialysis Catheter Type: Double-lumen  Site Condition No complications  Blue Lumen Status Heparin locked;Flushed;Dead end cap in place  Red Lumen Status Heparin locked;Dead end cap in place;Flushed  Purple Lumen Status N/A  Catheter fill solution 4% Sodium Citrate  Catheter fill volume (Arterial) 1.9 cc  Catheter fill volume (Venous) 1.9  Dressing Type Transparent  Dressing Status Antimicrobial disc in place;Clean, Dry, Intact  Interventions  (deaccessed)  Drainage Description None  Dressing Change Due 12/13/22  Post treatment catheter status Capped and Clamped     Stacie Glaze LPN [facility]

## 2022-12-08 NOTE — Progress Notes (Signed)
Patient ID: Corey [name], male   DOB: 1970/08/18, 52 y.o.   MRN: [id number]  [facility] referral sent to [facility]

## 2022-12-08 NOTE — Progress Notes (Signed)
Physical Therapy Session Note  Patient Details  Name: Corey [name] MRN: [id number] Date of Birth: 02-17-1971  Today's Date: 12/08/2022 PT Individual Time: [phone] and [id number] PT Individual Time Calculation (min): 56 min and 71 min  Short Term Goals: Week 1:  PT Short Term Goal 1 (Week 1): pt will perform bed mobility with supervision PT Short Term Goal 2 (Week 1): pt will transfer bed<>chair with LRAD and CGA PT Short Term Goal 3 (Week 1): pt will transfer sit<>stand with LRAD and min A  Skilled Therapeutic Interventions/Progress Updates:   Treatment Session 1 Received pt supine in bed, pt agreeable to PT treatment, and reported pain 9/10 in L residual limb - RN notified and present to administer medication. Pt on 1.5L O2 via [place] with SPO2 96% - titrated to 1L and SPO2 89% increasing to 96% with pursed lip breathing - pt asymptomatic. Session with emphasis on functional mobility/transfers, D/C plan, and education on HEP.  Pt requesting more air in Roho cushion despite adequate inflation; therefore pumped up cushion per pt preference. Pt transferred supine<>sitting L EOB with supervision and performed slideboard transfer bed<>WC with supervision and assist to place board. Breakfast tray arrived and while pt ate, RN arrived to check dialysis catheter line and discussed D/C plan. Pt will now discharge home with brother here in [place], then move to [place] at end of month. Confirmed home set up - pt has ramp to enter with level entry once inside. Pt has old WC (unsure of size) but needs L amputee support pad and Roho cushion for skin integrity - updated CSW. Pt reports his cousin Irene Limbo will pick him up and has 3 different cars (2 trucks and 1 low sitting car but unsure of kind of car). Encouraged pt to call [name] and ensure he can bring lowest car to use slideboard - pt agreed. Discussed HEP and having brother walking him through exercises due to blindness. Provided pt with HEP (see below) and  discussed HHPT upon D/C: -half sit ups 2x10 with 5 second hold -glute squeezes x20 -sidelying hip flexion/extension 3x10 -sidelying hip abduction 3x10 -supine hip adduction/adduction 3x10 -supine hip flexion 3x10 -single leg bridge 3x10 -hamstring stretch 3x10 with 10 second hold -quad sets 3x10 with 5 second isometric hold -wheelchair Push-Up (AKA)  - 1 x daily - 7 x weekly - 3 sets - 10 reps -seated Long Arc Quad  - 1 x daily - 7 x weekly - 3 sets - 10 reps -seated March  - 1 x daily - 7 x weekly - 3 sets - 10 reps Concluded session with pt sitting in WC, needs within reach, and seatbelt alarm on awaiting upcoming OT session.   Treatment Session 2 Received pt sitting in WC, pt agreeable to PT treatment, and reported pain 9/10 in L residual limb (premedicated). Pt on 2L O2 via [place] with SPO2 93% at rest, dropping to 89% with activity, but able to recover to >96% with extended rest. Session with emphasis on functional mobility/transfers, generalized strengthening and endurance, and WC mobility.  Pt performed WC mobility 127ft x 2 trials using BUE and supervision to/from dayroom - emphasis on UE strength. Pt requested to work on UE strength - therefore transferred to/from Nustep via lateral scoot with supervision and performed BUE and RLE strengthening on Nustep at workload 3 decreasing to 2 for 7 minutes for a total of 363 steps - pt required 2 rest breaks due to fatigue/SOB. Pt then performed the following UE, core, and  LE exercises: -"driving the car" with 2lb dumbbell 2x30 second intervals  -bicep curls with 2lb dumbbell 2x12 bilaterally  -R passive hamstring stretch with overpressure for ~5 minutes - pt with significant knee flexion contracture -R hip flexion 2x15 -R LAQ 2x15 Pt reported 8/10 fatigue after exercising. Pt then performed seated ball toss with 1lb dowel x 20 increasing to 2lb dowel for additional 3x20 with emphasis on UE strength, coordination, and reaction time. Concluded  session with pt sitting in WC, needs within reach, and seatbelt alarm on.   Therapy Documentation Precautions:  Precautions Precautions: Fall, Other (comment) Precaution Comments: watch BP; blind in L eye Restrictions Weight Bearing Restrictions: Yes LLE Weight Bearing: Non weight bearing  Therapy/Group: Individual Therapy [name] [name] PT, DPT 12/08/2022, 6:59 AM

## 2022-12-08 NOTE — Progress Notes (Signed)
Spoke to staff at [facility]. Pt can resume his regular schedule at d/c from rehab. Pt will resume TTS 11:30 arrival for 11:45 chair time. Clinic advised of pt's d/c date of 9/11 and that pt should resume on 9/12. Clinic asked that rehab CSW please contact [facility] with any address changes at d/c and to advise transportation that pt will resume at clinic on 9/12. Update provided to rehab CSW and renal PA. Will assist as needed.   Olivia Canter Renal Navigator [phone]

## 2022-12-08 NOTE — Plan of Care (Signed)
Problem: Consults Goal: RH LIMB LOSS PATIENT EDUCATION Description: Description: See Patient Education module for eduction specifics. Outcome: Progressing   Problem: RH BOWEL ELIMINATION Goal: RH STG MANAGE BOWEL WITH ASSISTANCE Description: STG Manage Bowel with Assistance. Outcome: Progressing   Problem: RH SKIN INTEGRITY Goal: RH STG SKIN FREE OF INFECTION/BREAKDOWN Outcome: Progressing Goal: RH STG ABLE TO PERFORM INCISION/WOUND CARE W/ASSISTANCE Description: STG Able To Perform Incision/Wound Care With Assistance. Outcome: Progressing   Problem: RH SAFETY Goal: RH STG ADHERE TO SAFETY PRECAUTIONS W/ASSISTANCE/DEVICE Description: STG Adhere to Safety Precautions With Assistance/Device. Outcome: Progressing   Problem: RH PAIN MANAGEMENT Goal: RH STG PAIN MANAGED AT OR BELOW PT'S PAIN GOAL Outcome: Progressing   Problem: RH KNOWLEDGE DEFICIT LIMB LOSS Goal: RH STG INCREASE KNOWLEDGE OF SELF CARE AFTER LIMB LOSS Outcome: Progressing   Problem: Education: Goal: Knowledge of the prescribed therapeutic regimen will improve Outcome: Progressing Goal: Ability to verbalize activity precautions or restrictions will improve Outcome: Progressing Goal: Understanding of discharge needs will improve Outcome: Progressing   Problem: Activity: Goal: Ability to perform//tolerate increased activity and mobilize with assistive devices will improve Outcome: Progressing   Problem: Clinical Measurements: Goal: Postoperative complications will be avoided or minimized Outcome: Progressing   Problem: Self-Care: Goal: Ability to meet self-care needs will improve Outcome: Progressing   Problem: Self-Concept: Goal: Ability to maintain and perform role responsibilities to the fullest extent possible will improve Outcome: Progressing   Problem: Pain Management: Goal: Pain level will decrease with appropriate interventions Outcome: Progressing   Problem: Skin Integrity: Goal:  Demonstration of wound healing without infection will improve Outcome: Progressing   Problem: Education: Goal: Knowledge of General Education information will improve Description: Including pain rating scale, medication(s)/side effects and non-pharmacologic comfort measures Outcome: Progressing   Problem: Health Behavior/Discharge Planning: Goal: Ability to manage health-related needs will improve Outcome: Progressing   Problem: Clinical Measurements: Goal: Ability to maintain clinical measurements within normal limits will improve Outcome: Progressing Goal: Will remain free from infection Outcome: Progressing Goal: Diagnostic test results will improve Outcome: Progressing Goal: Respiratory complications will improve Outcome: Progressing Goal: Cardiovascular complication will be avoided Outcome: Progressing   Problem: Activity: Goal: Risk for activity intolerance will decrease Outcome: Progressing   Problem: Nutrition: Goal: Adequate nutrition will be maintained Outcome: Progressing   Problem: Coping: Goal: Level of anxiety will decrease Outcome: Progressing   Problem: Elimination: Goal: Will not experience complications related to bowel motility Outcome: Progressing Goal: Will not experience complications related to urinary retention Outcome: Progressing   Problem: Consults Goal: RH LIMB LOSS PATIENT EDUCATION Description: Description: See Patient Education module for eduction specifics. Outcome: Progressing   Problem: RH BOWEL ELIMINATION Goal: RH STG MANAGE BOWEL WITH ASSISTANCE Description: STG Manage Bowel with Assistance. Outcome: Progressing   Problem: RH SKIN INTEGRITY Goal: RH STG SKIN FREE OF INFECTION/BREAKDOWN Outcome: Progressing Goal: RH STG ABLE TO PERFORM INCISION/WOUND CARE W/ASSISTANCE Description: STG Able To Perform Incision/Wound Care With Assistance. Outcome: Progressing   Problem: RH SAFETY Goal: RH STG ADHERE TO SAFETY PRECAUTIONS  W/ASSISTANCE/DEVICE Description: STG Adhere to Safety Precautions With Assistance/Device. Outcome: Progressing   Problem: RH PAIN MANAGEMENT Goal: RH STG PAIN MANAGED AT OR BELOW PT'S PAIN GOAL Outcome: Progressing   Problem: RH KNOWLEDGE DEFICIT LIMB LOSS Goal: RH STG INCREASE KNOWLEDGE OF SELF CARE AFTER LIMB LOSS Outcome: Progressing   Problem: Education: Goal: Knowledge of the prescribed therapeutic regimen will improve Outcome: Progressing Goal: Ability to verbalize activity precautions or restrictions will improve Outcome: Progressing Goal: Understanding of discharge  needs will improve Outcome: Progressing   Problem: Activity: Goal: Ability to perform//tolerate increased activity and mobilize with assistive devices will improve Outcome: Progressing   Problem: Clinical Measurements: Goal: Postoperative complications will be avoided or minimized Outcome: Progressing   Problem: Self-Care: Goal: Ability to meet self-care needs will improve Outcome: Progressing   Problem: Self-Concept: Goal: Ability to maintain and perform role responsibilities to the fullest extent possible will improve Outcome: Progressing   Problem: Pain Management: Goal: Pain level will decrease with appropriate interventions Outcome: Progressing   Problem: Skin Integrity: Goal: Demonstration of wound healing without infection will improve Outcome: Progressing   Problem: Education: Goal: Knowledge of General Education information will improve Description: Including pain rating scale, medication(s)/side effects and non-pharmacologic comfort measures Outcome: Progressing   Problem: Health Behavior/Discharge Planning: Goal: Ability to manage health-related needs will improve Outcome: Progressing   Problem: Clinical Measurements: Goal: Ability to maintain clinical measurements within normal limits will improve Outcome: Progressing Goal: Will remain free from infection Outcome:  Progressing Goal: Diagnostic test results will improve Outcome: Progressing Goal: Respiratory complications will improve Outcome: Progressing Goal: Cardiovascular complication will be avoided Outcome: Progressing   Problem: Activity: Goal: Risk for activity intolerance will decrease Outcome: Progressing   Problem: Nutrition: Goal: Adequate nutrition will be maintained Outcome: Progressing   Problem: Coping: Goal: Level of anxiety will decrease Outcome: Progressing   Problem: Elimination: Goal: Will not experience complications related to bowel motility Outcome: Progressing Goal: Will not experience complications related to urinary retention Outcome: Progressing

## 2022-12-09 MED ORDER — DULOXETINE HCL 20 MG PO CPEP
20.0000 mg | ORAL_CAPSULE | Freq: Every day | ORAL | Status: DC
  Administered 2022-12-09 – 2022-12-14 (×6): 20 mg via ORAL
  Filled 2022-12-09 (×6): qty 1

## 2022-12-09 NOTE — Progress Notes (Signed)
Physical Therapy Session Note  Patient Details  Name: Corey [name] MRN: [id number] Date of Birth: 1970/05/28  Today's Date: 12/09/2022 PT Individual Time: 0920-1000 PT Individual Time Calculation (min): 40 min   Short Term Goals: Week 2:  PT Short Term Goal 1 (Week 2): STG=LTG due to LOS  Skilled Therapeutic Interventions/Progress Updates:  pt received in w/c and agreeable to therapy. Pt reports increased, unrated pain in his LLE at rest and RLE when attempting standing. Pt recd morning meds during session, but not yet due for tramadol, nsg aware. Pt remained on 2L wall O2 throughout session.   Pt performed the following exercises to promote LE strength and endurance:  Attempted standing to RW, but was limited by RLE pain, so discontinued W/c push ups 4 x 8 for UE strength and endurance alternated with LAQ on RLE x 20 with emphasis on stretching into extension as pt is lack~20-30 deg Super set of 3 x 10 of each exercise with YTB- BUE rows, BUE chest press, BUE shoulder flexion. Required 2-3  min rest breaks for muscle recovery with these exercises.  2 x 15 modified sit ups in chair with therapist holding RLE for support  Pt remained in w/c and was left with all needs in reach and alarm active.    Therapy Documentation Precautions:  Precautions Precautions: Fall, Other (comment) Precaution Comments: watch BP; blind in L eye Restrictions Weight Bearing Restrictions: Yes LLE Weight Bearing: Non weight bearing General:       Therapy/Group: Individual Therapy  Juluis Rainier 12/09/2022, 9:29 AM

## 2022-12-09 NOTE — Progress Notes (Signed)
Patient ID: Corey [name], male   DOB: May 01, 1970, 52 y.o.   MRN: [id number]  Patient approved by [facility] orders sent.

## 2022-12-09 NOTE — Progress Notes (Signed)
Physical Therapy Session Note  Patient Details  Name: Corey [name] MRN: [id number] Date of Birth: 1970-10-25  Today's Date: 12/09/2022 PT Individual Time: 1030-1109 and 1330-1413 PT Individual Time Calculation (min): 39 min and 43 min  Short Term Goals: Week 1:  PT Short Term Goal 1 (Week 1): pt will perform bed mobility with supervision PT Short Term Goal 2 (Week 1): pt will transfer bed<>chair with LRAD and CGA PT Short Term Goal 3 (Week 1): pt will transfer sit<>stand with LRAD and min A  Skilled Therapeutic Interventions/Progress Updates:   Treatment Session 1 Received pt sitting in WC, pt agreeable to PT treatment, and reported pain 9/10 in L residual limb - RN notified and present to administer medication at end of session. Session with emphasis on functional mobility/transfers, generalized strengthening and endurance, and WC mobility. Pt on 2L with SPO2 100% at rest and >87% throughout session with activity. Pt required frequent rest breaks due to fatigue and SOB but O2 sats improved with pursed lip breathing. Pt transported to/from room in [facility] dependently for time management purposes. Pt performed BUE strengthening on UBE at level 3 alternating 1 minute forwards and 1 minute backwards for 4 minutes with emphasis on UE strength and cardiovascular endurance - 1 rest break halfway through. Pt requesting therapist tech him how to don back brace (LSO). Verbal/visual/tactile cues provided and pt required min A overall to don LSO. Concluded session with pt sitting in WC, needs within reach, and seatbelt alarm on. RN present at bedside.   Treatment Session 2 Received pt sitting in WC, pt agreeable to PT treatment, and reported pain 9/10 in L residual limb (premedicated). Session with emphasis on functional mobility/transfers, generalized strengthening and endurance, and WC mobility. Pt on 2L and denying any SOB but pulse ox inaccurate (reading 70%-77% initially) but eventually read 98%+ when placed  on L 3rd digit.    Pt transported to/from room in [facility] dependently for time management purposes. Pt then performed the following exercises with emphasis on UE, LE, and core strength: -seated trunk rotation with 4.4lb medicine ball 2x10 bilaterally -shoulder extensions into physioball 10x5 second hold -shoulder ER with red TB 2x10 -rows with red TB 2x10 -WC pushups 2x8 -hip adduction pillow squeezes 2x10 with 5 second isometric hold  Returned to room and concluded session with pt sitting in WC, needs within reach, and seatbelt alarm on. Pt reporting 8/10 fatigue at end of session but remains motivated to continue getting stronger.   Therapy Documentation Precautions:  Precautions Precautions: Fall, Other (comment) Precaution Comments: watch BP; blind in L eye Restrictions Weight Bearing Restrictions: Yes LLE Weight Bearing: Non weight bearing  Therapy/Group: Individual Therapy [name] [name] PT, DPT 12/09/2022, 7:04 AM

## 2022-12-09 NOTE — Progress Notes (Signed)
Physical Therapy Weekly Progress Note  Patient Details  Name: Corey [name] MRN: [id number] Date of Birth: Aug 11, 1970  Beginning of progress report period: November 30, 2022 End of progress report period: December 09, 2022  Patient has met 2 of 3 short term goals. Pt demonstrates slow progress towards long term goals. Pt is currently able to perform bed mobility with supervision using bed features, slideboard transfers with supervision (assist to place/remove board), and WC mobility up to 172ft using BUE and supervision. Pt has been unable to stand due to poor condition of contralateral limb and R knee flexion contracture. Pt continues to be limited by high pain levels in L residual limb, decreased oxygen support (requiring 1-2L supplemental O2), and global weakness/deconditioning. Pt's D/C date extended to 9/11 and D/C plan has now changed and pt will D/C home with brother, then move to [place] at end of month.   Patient continues to demonstrate the following deficits muscle weakness and muscle joint tightness, decreased cardiorespiratoy endurance and decreased oxygen support, decreased visual acuity, and decreased standing balance, decreased postural control, decreased balance strategies, and difficulty maintaining precautions and therefore will continue to benefit from skilled PT intervention to increase functional independence with mobility.  Patient progressing toward long term goals..  Continue plan of care.  PT Short Term Goals Week 1:  PT Short Term Goal 1 (Week 1): pt will perform bed mobility with supervision PT Short Term Goal 1 - Progress (Week 1): Met PT Short Term Goal 2 (Week 1): pt will transfer bed<>chair with LRAD and CGA PT Short Term Goal 2 - Progress (Week 1): Met PT Short Term Goal 3 (Week 1): pt will transfer sit<>stand with LRAD and min A PT Short Term Goal 3 - Progress (Week 1): Not met Week 2:  PT Short Term Goal 1 (Week 2): STG=LTG due to LOS  Skilled Therapeutic  Interventions/Progress Updates:  Ambulation/gait training;Discharge planning;Functional mobility training;Psychosocial support;Therapeutic Activities;Visual/perceptual remediation/compensation;Balance/vestibular training;Disease management/prevention;Neuromuscular re-education;Skin care/wound management;Therapeutic Exercise;Wheelchair propulsion/positioning;DME/adaptive equipment instruction;Pain management;Splinting/orthotics;UE/LE Strength taining/ROM;Community reintegration;Patient/family education;Stair training;UE/LE Coordination activities   Therapy Documentation Precautions:  Precautions Precautions: Fall, Other (comment) Precaution Comments: watch BP; blind in L eye Restrictions Weight Bearing Restrictions: Yes LLE Weight Bearing: Non weight bearing  Therapy/Group: Individual Therapy [name] [name] PT, DPT 12/09/2022, 7:07 AM

## 2022-12-09 NOTE — Progress Notes (Signed)
[facility] Progress Note   Subjective:    Seen and examined patient at bedside. Noted he signed off HD early yesterday and 1.6L was removed. He denies any acute issues. Next HD 12/10/22.  Objective Vitals:   12/08/22 2000 12/09/22 0442 12/09/22 0443 12/09/22 1421  BP: 115/70 105/68  104/68  Pulse: 66 61  (!) 57  Resp: 16 16  16   Temp: 98.9 F (37.2 C) 98.2 F (36.8 C)  (!) 97.4 F (36.3 C)  TempSrc: Oral     SpO2: 100% 100%  100%  Weight:   56 kg   Height:       Physical Exam General: Alert male in NAD Heart: RRR, no murmurs, rubs or gallops Lungs: + crackles LLL. On O2 2L via [place], respirations unlabored Abdomen: Soft, non-distended, +BS Extremities: 1+ edema LLE Dialysis Access: R internal jugular [facility]   Filed Weights   12/08/22 1549 12/08/22 1843 12/09/22 0443  Weight: 56 kg 55 kg 56 kg    Intake/Output Summary (Last 24 hours) at 12/09/2022 1423 Last data filed at 12/09/2022 1251 Gross per 24 hour  Intake 472 ml  Output 1600 ml  Net -1128 ml    Additional Objective Labs: Basic Metabolic Panel: Recent Labs  Lab 12/05/22 0637 12/06/22 1430 12/08/22 1208  NA 138 141 135  K 4.8 5.3* 4.5  CL 99 98 97*  CO2 26 24 23   GLUCOSE 87 93 87  BUN 54* 70* 53*  CREATININE 6.29* 7.55* 6.31*  CALCIUM 9.4 9.6 9.4  PHOS 3.2 4.0 3.5   Liver Function Tests: Recent Labs  Lab 12/05/22 0637 12/06/22 1430 12/08/22 1208  ALBUMIN 2.9* 2.9* 3.1*   No results for input(s): "LIPASE", "AMYLASE" in the last 168 hours. CBC: Recent Labs  Lab 12/03/22 1139 12/05/22 0637 12/06/22 1430 12/08/22 1208  WBC 9.8 8.9 10.2 8.2  HGB 9.0* 8.3* 8.4* 9.1*  HCT 31.5* 29.6* 30.1* 31.9*  MCV 98.1 97.0 97.4 98.2  PLT 206 179 216 203   Blood Culture    Component Value Date/Time   SDES BLOOD LEFT ARM 11/03/2022 0855   SPECREQUEST  11/03/2022 0855    BOTTLES DRAWN AEROBIC AND ANAEROBIC Blood Culture adequate volume   CULT  11/03/2022 0855    NO GROWTH 5 DAYS Performed  at [facility], [address] [place], [place] [address]    REPTSTATUS 11/08/2022 FINAL 11/03/2022 0855    Cardiac Enzymes: No results for input(s): "CKTOTAL", "CKMB", "CKMBINDEX", "TROPONINI" in the last 168 hours. CBG: No results for input(s): "GLUCAP" in the last 168 hours. Iron Studies: No results for input(s): "IRON", "TIBC", "TRANSFERRIN", "FERRITIN" in the last 72 hours. Lab Results  Component Value Date   INR 2.7 (H) 11/19/2022   INR 3.2 (H) 11/18/2022   INR 1.4 (H) 11/15/2022   Studies/Results: No results found.  Medications:   acetaminophen  650 mg Oral TID WC & HS   amiodarone  200 mg Oral Daily   apixaban  2.5 mg Oral BID   bacitracin   Topical BID   Chlorhexidine Gluconate Cloth  6 each Topical Q12H   Chlorhexidine Gluconate Cloth  6 each Topical Q0600   Chlorhexidine Gluconate Cloth  6 each Topical Q0600   cinacalcet  30 mg Oral Q supper   darbepoetin (ARANESP) injection - DIALYSIS  100 mcg Subcutaneous Q Sat-1800   docusate sodium  100 mg Oral Daily   DULoxetine  20 mg Oral Daily   feeding supplement  237 mL  Oral BID BM   gabapentin  300 mg Oral QHS   hydrocerin   Topical Daily   lidocaine  3 patch Transdermal Q24H   lidocaine   Topical TID   melatonin  3 mg Oral QHS   midodrine  15 mg Oral TID WC   multivitamin  1 tablet Oral QHS   polyethylene glycol  17 g Oral BID   senna-docusate  1 tablet Oral BID   sucroferric oxyhydroxide  500 mg Oral TID WC    Dialysis Orders: TTS [facility] 4h  2/2 bath  66kg   TDC   no heparin (allergy)  lock TDC w/ citrate - No ESA or VDRA - Binder: Renvela pwdr 4.8g TID - Sensipar 180mg  every day at home  Assessment/Plan: # Acute hypoxic respiratory failure: In setting of missed HD, pna w/ effusion. CXR with pleural effusion s/p chest tube 7/31 now removed. Continue UF with HD as tolerated with albumin for BP support   # PNA/ L parapneumonic pleural effusion: s/p thoracentesis with cultures. S/p IV abx course and  finished with augmentin   #Left leg wound - s/p AKA 8/15   # ESRD: continue HD per TTS schedule.  UF as able with support of albumin.  Hypotension limits UF.  - note that he will need a lower EDW given the amputation.  For now would consider ~50 kg as EDW  - fluid restrictions are in place   #Heparin allergy - use citrate for cath lock.   #Hypotension/ volume:  He is on midodrine. UF as tolerated with HD    #Anemia of ESRD: Hgb 8.4. have increased aranesp to 100 mcg every Sat for the 8/31 dose onward.  Anticipate need to further increase   # Secondary HPTH: switched to sevelamer per pharm recs.  At one point cinacalcet was held due to hypocalcemia but then resumed at 90 mg daily (half of his normal dose).  Stopped cinacalcet again on 8/26 due to hypocalcemia, Calcium now slightly high and PTH is 1125. Will restart sensipar 30mg  for now and follow trend. He was having trouble dealing with the texture of the renvela powder. Trying chewable velphoro 1 per meal.    # Hypocalcemia - see above - held sensipar, resolved, restarting at lower dose   #A-fib: On amiodarone and eliquis - per primary team; has IVC filter   # H/o type B aortic dissection s/p repair (2004)   Salome Holmes, NP [facility] 12/09/2022,2:23 PM  LOS: 10 days

## 2022-12-09 NOTE — Progress Notes (Signed)
PROGRESS NOTE   Subjective/Complaints: No new complaints this morning Continues to complain of phantom limb pain, discussed Cymbalta and he would like to try   ROS: +Phantom limb pain- continues, nosebleeds, +malaise Denies fevers, chills, N/V, abdominal pain, constipation, diarrhea, SOB, cough, chest pain, new weakness or paraesthesias.  +bloody clots with stool last night   Objective:   No results found. Recent Labs    12/06/22 1430 12/08/22 1208  WBC 10.2 8.2  HGB 8.4* 9.1*  HCT 30.1* 31.9*  PLT 216 203   Recent Labs    12/06/22 1430 12/08/22 1208  NA 141 135  K 5.3* 4.5  CL 98 97*  CO2 24 23  GLUCOSE 93 87  BUN 70* 53*  CREATININE 7.55* 6.31*  CALCIUM 9.6 9.4    Intake/Output Summary (Last 24 hours) at 12/09/2022 1256 Last data filed at 12/09/2022 1251 Gross per 24 hour  Intake 472 ml  Output 1600 ml  Net -1128 ml        Physical Exam: Vital Signs Blood pressure 105/68, pulse 61, temperature 98.2 F (36.8 C), resp. rate 16, height 6\' 3"  (1.905 m), weight 56 kg, SpO2 100%. Gen: no distress, normal appearing, BMI is 15.43 Eyes: Pupils equal and round.  Looks in general direction.  Says can see shapes, but nothing clear  Cardiovascular:    Bradycardia    Heart sounds: Normal heart sounds. No murmur heard.    No gallop.  Pulmonary:     Effort: Pulmonary effort is normal. No respiratory distress.     Breath sounds: No wheezing, rhonchi or rales.     Comments: Decreased at bases, otherwise clear to auscultation. Abdominal:     General: Abdomen is flat. There is no distension.     Palpations: Abdomen is soft.     Tenderness: There is no abdominal tenderness.  Musculoskeletal:        General: No swelling or tenderness.     Cervical back: Neck supple. No tenderness.     Comments: Amputation site well wrapped in figure-of-eight for Ace bandage, loose and dog-eared; rewrapped  Right foot with overgrown  toe nails.   Ues 5-/5 B/L RLE- HF 4-/5; otherwise DF 0/5 and PF and KE 2-/5 LLE- L AKA- with HF 4-/5  GU: no bloody clots in stool this morning Skin:     General: Skin is warm and dry.     Comments: Boggy area infra clavicular area--non tender and reported to be stable. Multiple grafts RUE--non functional. R-internal jugular noted. RLE with areas of discoloration and stasis changes.  R HD catheter looks OK 2 IV"s LUE- look OK  Leg/ L AKA- tip elevated on pillow-dressing with some dried blood, staples appear well-approximated and intact.  No apparent drainage or edema.  Healing well.  Neurological:     Mental Status: He is alert and oriented to person, place, and time.     Comments: Decreased to absent below R knee  Psychiatric:        Mood and Affect: Mood normal.        Behavior: Behavior normal.   Assessment/Plan: 1. Functional deficits which require 3+ hours per day of interdisciplinary therapy in  a comprehensive inpatient rehab setting. Physiatrist is providing close team supervision and 24 hour management of active medical problems listed below. Physiatrist and rehab team continue to assess barriers to discharge/monitor patient progress toward functional and medical goals  Care Tool:  Bathing    Body parts bathed by patient: Right arm, Left arm, Chest, Abdomen, Face (UB bathing only)     Body parts n/a: Front perineal area, Buttocks, Right upper leg, Left upper leg, Right lower leg, Left lower leg   Bathing assist Assist Level: Supervision/Verbal cueing     Upper Body Dressing/Undressing Upper body dressing   What is the patient wearing?: Pull over shirt    Upper body assist Assist Level: Set up assist    Lower Body Dressing/Undressing Lower body dressing      What is the patient wearing?: Ace wrap/stump shrinker     Lower body assist Assist for lower body dressing: Minimal Assistance - Patient > 75%     Toileting Toileting    Toileting assist Assist for  toileting: Moderate Assistance - Patient 50 - 74%     Transfers Chair/bed transfer  Transfers assist  Chair/bed transfer activity did not occur: Safety/medical concerns  Chair/bed transfer assist level: Supervision/Verbal cueing (slideboard)     Locomotion Ambulation   Ambulation assist   Ambulation activity did not occur: Safety/medical concerns (pain, fear of falling, decreased balance, protection of contralateral limb)          Walk 10 feet activity   Assist  Walk 10 feet activity did not occur: Safety/medical concerns (pain, fear of falling, decreased balance, protection of contralateral limb)        Walk 50 feet activity   Assist Walk 50 feet with 2 turns activity did not occur: Safety/medical concerns (pain, fear of falling, decreased balance, protection of contralateral limb)         Walk 150 feet activity   Assist Walk 150 feet activity did not occur: Safety/medical concerns (pain, fear of falling, decreased balance, protection of contralateral limb)         Walk 10 feet on uneven surface  activity   Assist Walk 10 feet on uneven surfaces activity did not occur: Safety/medical concerns (pain, fear of falling, decreased balance, protection of contralateral limb)         Wheelchair     Assist Is the patient using a wheelchair?: Yes Type of Wheelchair: Manual Wheelchair activity did not occur: Safety/medical concerns (pain)  Wheelchair assist level: Supervision/Verbal cueing Max wheelchair distance: 100    Wheelchair 50 feet with 2 turns activity    Assist    Wheelchair 50 feet with 2 turns activity did not occur: Safety/medical concerns (pain)   Assist Level: Supervision/Verbal cueing   Wheelchair 150 feet activity     Assist  Wheelchair 150 feet activity did not occur: Safety/medical concerns (pain)       Blood pressure 105/68, pulse 61, temperature 98.2 F (36.8 C), resp. rate 16, height 6\' 3"  (1.905 m), weight  56 kg, SpO2 100%.  Medical Problem List and Plan: 1. Functional deficits secondary to  L AKA secondary to left foot wound 2/2 PAD             -patient may not shower due to R HD catheter             -ELOS/Goals: 15 days supervision, extended to Wednesday  2.  Antithrombotics: -DVT/anticoagulation:  Pharmaceutical: Continue Eliquis. IVC filter in place.              -  antiplatelet therapy: N/A  3. Penile pain: topical bacitracin ordered, continue  4. Insomnia complicated by phantom pain: d/c amitriptyline since not helping with his sleep and cannot increase further given cardiac history. Add melatonin 3mg  HS  -Continues to be limited by phantom pain with difficulty sleeping; already getting as needed Ambien, would not further dose increase sleep medications  5. Neuropsych/cognition: This patient is capable of making decisions on his own behalf. 6. Skin/Wound Care: Routine pressure relief measure 7. Fluids/Electrolytes/Nutrition:  Strict I/O. Daily weights. Diet changed to Renal per input from nephrology. 8. L-AKA: Monitor incision for healing. Dry dressing. Shrinkers ordered  -8-31, 9/1: Per patient, not tolerating shrinkers s, but tolerating figure-of-eight ace wrap.  Continue.  9. ESRD: Renal diet with 1200 cc FR/HD TTS at the end of the day to help with tolerance of therapy.  10. H/o AAA aneurysm rupture s/p repair:  with resultant paraplegia, sensory deficits from saddle down and cauda equina syndrome             --was able to walk till a few years ago when left knee gave out.              --has severe LLQ pain prior to defecation.Continue Senna/miralax--discussed suppository every other day but patient wants to think on it "that's too much"  11. Anemia of chronic disease:] 12. L- PNA and parapneumonic effusion s/p CT: Has completed 2 week course of antibiotics.  13. Fluid overload: Continue to monitor for symptoms and wean oxygen as tolerated.   -Weights up to baseline on 9/2 Filed  Weights   12/08/22 1549 12/08/22 1843 12/09/22 0443  Weight: 56 kg 55 kg 56 kg    14. Neurogenic bowel: Will monitor for now.  Last bowel movement 9/2, d/c bisacodyl suppository    15. Neuropathic pain- will change gabapentin to 300 mg at bedtime, decrease amitriptyline to 10mg  since not helping with sleep and we cannot increase further due to cardiac history  D/c amitriptyline since it is not helping and given cardiac risk factors  Prescribing Home Zynex NexWave Stimulator Device and supplies as needed. IFC, NMES and TENS medically necessary Treatment Rx: Daily @ 30-40 minutes per treatment PRN. Zynex NexWave only, no substitutions. Treatment Goals: 1) To reduce and/or eliminate pain 2) To improve functional capacity and Activities of daily living 3) To reduce or prevent the need for oral medications 4) To improve circulation in the injured region 5) To decrease or prevent muscle spasm and muscle atrophy 6) To provide a self-management tool to the patient The patient has not sufficiently improved with conservative care. Numerous studies indexed by Medline and PubMed.gov have shown Neuromuscular, Interferential, and TENS stimulators to reduce pain, improve function, and reduce medication use in injured patients. Continued use of this evidence based, safe, drug free treatment is both reasonable and medically necessary at this time.   -cymbalta 20mg  daily started  16. Leukocytosis: WBC reviewed and has normalized  17. Underweight: provide dietary education, monitor weights  18. Suboptimal vitamin D: will not start supplement due to allergy to doxecalciferol. Will order grounds pass so he can have sunlight exposure.   19. Chest pain, chronic: renal is aware, continue ultrafiltration with dialysis  20.  Overnight epistaxis.  Reordered oxygen to be humidified. Resolved  21. Malaise: improved, oxygen reviewed and is satting well, weekend notes and nephrology notes reviewed, CXR and COVID test  ordered to r/o infection, discussed that both are negative for new pathology  22. Bradycardia: continue to  monitor HR TID, resolved  23. Bloody clots in stool: anusol ordered, discussed to let us know if happens again, discussed possibly secondary to internal hemorrhoids  LOS: 10 days A FACE TO FACE EVALUATION WAS PERFORMED  Drema Pry Rosha Cocker 12/09/2022, 12:56 PM

## 2022-12-09 NOTE — Progress Notes (Addendum)
Patient ID: Corey [name], male   DOB: Sep 04, 1970, 52 y.o.   MRN: [id number]  SW informed patient can resume his regular schedule at d/c with [facility]. Will resume TTS on 9/12 11:30 arrival for 11:45 chair time.   Sw left detailed VM with patient brother, [name] and daughter, [name]. SW will wait for FU.

## 2022-12-09 NOTE — Progress Notes (Signed)
Occupational Therapy Session Note  Patient Details  Name: Corey [name] MRN: [id number] Date of Birth: 03-13-1971  Today's Date: 12/09/2022 OT Individual Time: 7829-5621 OT Individual Time Calculation (min): 55 min    Short Term Goals: Week 2:  OT Short Term Goal 1 (Week 2): LTG= STGs  Skilled Therapeutic Interventions/Progress Updates:    Patient received up in wheelchair and fully dressed in clean clothing.  Per patient he washed and dressed this am.  Patient with pain in left leg - but reports he is not due for pain medication.  Patient agreeable to transport himself to sink, and effectively moves wheelchair around in his room.  Patient on 3.5 Liters of O2, although therapy notes indicate patient has been using 1.5-2 Liters in therapy.  Patient with 97% O2 SAT, so decreased to 2 Liters - rechecked at 100%.   Patient completed grooming at sink, and returned to bedside.  Completed upper extremity exercises with 3lb weighted bar.  Patient declined opportunity to practice tub/transfer again, stating - "I think I got it."  Patient left up in wheelchair with chair pad alarm in place and engaged and call bell/ tray table in reach.   Therapy Documentation Precautions:  Precautions Precautions: Fall, Other (comment) Precaution Comments: watch BP; blind in L eye Restrictions Weight Bearing Restrictions: Yes LLE Weight Bearing: Non weight bearing   Pain: Pain Assessment Pain Scale: 0-10 Pain Score: 5  Pain Type: Acute pain Pain Location: Leg Pain Orientation: Left Pain Intervention(s): Medication (See eMAR)     Therapy/Group: Individual Therapy  [name] 12/09/2022, 1:07 PM

## 2022-12-10 DIAGNOSIS — K921 Melena: Secondary | ICD-10-CM

## 2022-12-10 DIAGNOSIS — G548 Other nerve root and plexus disorders: Secondary | ICD-10-CM

## 2022-12-10 DIAGNOSIS — R52 Pain, unspecified: Secondary | ICD-10-CM

## 2022-12-10 DIAGNOSIS — K592 Neurogenic bowel, not elsewhere classified: Secondary | ICD-10-CM

## 2022-12-10 LAB — CBC WITH DIFFERENTIAL/PLATELET
Abs Immature Granulocytes: 0.04 K/uL (ref 0.00–0.07)
Basophils Absolute: 0.1 K/uL (ref 0.0–0.1)
Basophils Relative: 1 %
Eosinophils Absolute: 0.2 K/uL (ref 0.0–0.5)
Eosinophils Relative: 2 %
HCT: 31.5 % — ABNORMAL LOW (ref 39.0–52.0)
Hemoglobin: 8.7 g/dL — ABNORMAL LOW (ref 13.0–17.0)
Immature Granulocytes: 1 %
Lymphocytes Relative: 11 %
Lymphs Abs: 0.8 K/uL (ref 0.7–4.0)
MCH: 27.1 pg (ref 26.0–34.0)
MCHC: 27.6 g/dL — ABNORMAL LOW (ref 30.0–36.0)
MCV: 98.1 fL (ref 80.0–100.0)
Monocytes Absolute: 0.6 K/uL (ref 0.1–1.0)
Monocytes Relative: 8 %
Neutro Abs: 5.9 K/uL (ref 1.7–7.7)
Neutrophils Relative %: 77 %
Platelets: 213 K/uL (ref 150–400)
RBC: 3.21 MIL/uL — ABNORMAL LOW (ref 4.22–5.81)
RDW: 19.5 % — ABNORMAL HIGH (ref 11.5–15.5)
WBC: 7.6 K/uL (ref 4.0–10.5)
nRBC: 0.3 % — ABNORMAL HIGH (ref 0.0–0.2)

## 2022-12-10 LAB — RENAL FUNCTION PANEL
Albumin: 2.9 g/dL — ABNORMAL LOW (ref 3.5–5.0)
Anion gap: 13 (ref 5–15)
BUN: 42 mg/dL — ABNORMAL HIGH (ref 6–20)
CO2: 25 mmol/L (ref 22–32)
Calcium: 9.2 mg/dL (ref 8.9–10.3)
Chloride: 98 mmol/L (ref 98–111)
Creatinine, Ser: 6.01 mg/dL — ABNORMAL HIGH (ref 0.61–1.24)
GFR, Estimated: 11 mL/min — ABNORMAL LOW (ref >60)
Glucose, Bld: 89 mg/dL (ref 70–99)
Phosphorus: 3 mg/dL (ref 2.5–4.6)
Potassium: 4.8 mmol/L (ref 3.5–5.1)
Sodium: 136 mmol/L (ref 135–145)

## 2022-12-10 MED ORDER — ALBUMIN HUMAN 25 % IV SOLN
25.0000 g | Freq: Once | INTRAVENOUS | Status: AC
  Administered 2022-12-10: 25 g via INTRAVENOUS
  Filled 2022-12-10: qty 100

## 2022-12-10 MED ORDER — ANTICOAGULANT SODIUM CITRATE 4% (200MG/5ML) IV SOLN
5.0000 mL | Freq: Once | Status: AC
  Administered 2022-12-13: 3.8 mL
  Filled 2022-12-10: qty 5
  Filled 2022-12-10: qty 3.8
  Filled 2022-12-10: qty 5

## 2022-12-10 MED ORDER — ALBUMIN HUMAN 25 % IV SOLN: 12.5000 g | Freq: Once | INTRAVENOUS | Status: DC

## 2022-12-10 NOTE — Progress Notes (Addendum)
PROGRESS NOTE   Subjective/Complaints: No acute events overnight.  Patient had hemodialysis today, feels tired after this.  Continues to have residual limb and phantom pain in his left lower extremity.   ROS: +Phantom limb pain- continues, nosebleeds, +malaise  Denies fevers, chills, N/V, abdominal pain, constipation, diarrhea, SOB, cough, chest pain, new weakness or paraesthesias.  +bloody clots with stool last night Review of Systems  Constitutional:  Positive for malaise/fatigue. Negative for chills and fever.  HENT:  Negative for congestion.   Respiratory:  Negative for shortness of breath.   Cardiovascular:  Negative for chest pain.  Gastrointestinal:  Negative for nausea and vomiting.  Musculoskeletal:  Positive for joint pain.  Neurological:  Positive for weakness.     Objective:   No results found. Recent Labs    12/08/22 1208 12/10/22 0654  WBC 8.2 7.6  HGB 9.1* 8.7*  HCT 31.9* 31.5*  PLT 203 213   Recent Labs    12/08/22 1208 12/10/22 0654  NA 135 136  K 4.5 4.8  CL 97* 98  CO2 23 25  GLUCOSE 87 89  BUN 53* 42*  CREATININE 6.31* 6.01*  CALCIUM 9.4 9.2    Intake/Output Summary (Last 24 hours) at 12/10/2022 1715 Last data filed at 12/10/2022 1420 Gross per 24 hour  Intake 310 ml  Output 2000 ml  Net -1690 ml        Physical Exam: Vital Signs Blood pressure 109/84, pulse 64, temperature 98.1 F (36.7 C), temperature source Oral, resp. rate 18, height 6\' 3"  (1.905 m), weight 52 kg, SpO2 100%. Gen: no distress, normal appearing, BMI is 15.43 Eyes: Pupils equal and round.  Looks in general direction.  Says can see shapes, but nothing clear  Cardiovascular:    RRR Pulmonary: CTAB, nonlabored Abdominal:     General: Abdomen is flat. There is no distension.     Palpations: Abdomen is soft.     Tenderness: There is no abdominal tenderness.  Musculoskeletal:        General: No swelling or  tenderness.     Cervical back: Neck supple. No tenderness.     Comments: Amputation site well wrapped in figure-of-eight for Ace bandage, loose and dog-eared; rewrapped  Right foot with overgrown toe nails.   Ues 5-/5 B/L RLE- HF 4-/5; otherwise DF 0/5 and PF and KE 2-/5 LLE- L AKA- with HF 4-/5  GU: no bloody clots in stool this morning Skin:     General: Skin is warm and dry.     Comments: Boggy area infra clavicular area--non tender and reported to be stable. Multiple grafts RUE--non functional. R-internal jugular noted. RLE with areas of discoloration and stasis changes.  R HD catheter looks OK 2 IV"s LUE- look OK  Leg/ L AKA- tip elevated on pillow-dressing with some dried blood, staples appear well-approximated and intact.  No apparent drainage or edema.  Healing well.  Neurological:     Mental Status: He is alert and oriented to person, place, and time.     Comments: Decreased to absent below R knee  Psychiatric:     Cooperative and appropriate  Assessment/Plan: 1. Functional deficits which require 3+ hours per  day of interdisciplinary therapy in a comprehensive inpatient rehab setting. Physiatrist is providing close team supervision and 24 hour management of active medical problems listed below. Physiatrist and rehab team continue to assess barriers to discharge/monitor patient progress toward functional and medical goals  Care Tool:  Bathing    Body parts bathed by patient: Right arm, Left arm, Chest, Abdomen, Face (UB bathing only)     Body parts n/a: Front perineal area, Buttocks, Right upper leg, Left upper leg, Right lower leg, Left lower leg   Bathing assist Assist Level: Supervision/Verbal cueing     Upper Body Dressing/Undressing Upper body dressing   What is the patient wearing?: Pull over shirt    Upper body assist Assist Level: Set up assist    Lower Body Dressing/Undressing Lower body dressing      What is the patient wearing?: Ace wrap/stump  shrinker     Lower body assist Assist for lower body dressing: Minimal Assistance - Patient > 75%     Toileting Toileting    Toileting assist Assist for toileting: Moderate Assistance - Patient 50 - 74%     Transfers Chair/bed transfer  Transfers assist  Chair/bed transfer activity did not occur: Safety/medical concerns  Chair/bed transfer assist level: Supervision/Verbal cueing (slideboard)     Locomotion Ambulation   Ambulation assist   Ambulation activity did not occur: Safety/medical concerns (pain, fear of falling, decreased balance, protection of contralateral limb)          Walk 10 feet activity   Assist  Walk 10 feet activity did not occur: Safety/medical concerns (pain, fear of falling, decreased balance, protection of contralateral limb)        Walk 50 feet activity   Assist Walk 50 feet with 2 turns activity did not occur: Safety/medical concerns (pain, fear of falling, decreased balance, protection of contralateral limb)         Walk 150 feet activity   Assist Walk 150 feet activity did not occur: Safety/medical concerns (pain, fear of falling, decreased balance, protection of contralateral limb)         Walk 10 feet on uneven surface  activity   Assist Walk 10 feet on uneven surfaces activity did not occur: Safety/medical concerns (pain, fear of falling, decreased balance, protection of contralateral limb)         Wheelchair     Assist Is the patient using a wheelchair?: Yes Type of Wheelchair: Manual Wheelchair activity did not occur: Safety/medical concerns (pain)  Wheelchair assist level: Supervision/Verbal cueing Max wheelchair distance: 100    Wheelchair 50 feet with 2 turns activity    Assist    Wheelchair 50 feet with 2 turns activity did not occur: Safety/medical concerns (pain)   Assist Level: Supervision/Verbal cueing   Wheelchair 150 feet activity     Assist  Wheelchair 150 feet activity did  not occur: Safety/medical concerns (pain)       Blood pressure 109/84, pulse 64, temperature 98.1 F (36.7 C), temperature source Oral, resp. rate 18, height 6\' 3"  (1.905 m), weight 52 kg, SpO2 100%.  Medical Problem List and Plan: 1. Functional deficits secondary to  L AKA secondary to left foot wound 2/2 PAD             -patient may not shower due to R HD catheter             -ELOS/Goals: 15 days supervision, extended to Wednesday  -Continue CIR  2.  Antithrombotics: -DVT/anticoagulation:  Pharmaceutical: Continue Eliquis.  IVC filter in place.              -antiplatelet therapy: N/A  3. Penile pain: topical bacitracin ordered, continue  4. Insomnia complicated by phantom pain: d/c amitriptyline since not helping with his sleep and cannot increase further given cardiac history. Add melatonin 3mg  HS  -Continues to be limited by phantom pain with difficulty sleeping; already getting as needed Ambien, would not further dose increase sleep medications  5. Neuropsych/cognition: This patient is capable of making decisions on his own behalf. 6. Skin/Wound Care: Routine pressure relief measure 7. Fluids/Electrolytes/Nutrition:  Strict I/O. Daily weights. Diet changed to Renal per input from nephrology. 8. L-AKA: Monitor incision for healing. Dry dressing. Shrinkers ordered  -8-31, 9/1: Per patient, not tolerating shrinkers s, but tolerating figure-of-eight ace wrap.  Continue.  9. ESRD: Renal diet with 1200 cc FR/HD TTS at the end of the day to help with tolerance of therapy.  10. H/o AAA aneurysm rupture s/p repair:  with resultant paraplegia, sensory deficits from saddle down and cauda equina syndrome             --was able to walk till a few years ago when left knee gave out.              --has severe LLQ pain prior to defecation.Continue Senna/miralax--discussed suppository every other day but patient wants to think on it "that's too much"  11. Anemia of chronic disease:] 12. L- PNA  and parapneumonic effusion s/p CT: Has completed 2 week course of antibiotics.  13. Fluid overload: Continue to monitor for symptoms and wean oxygen as tolerated.   -Weights up to baseline on 9/2 Filed Weights   12/10/22 0408 12/10/22 1013 12/10/22 1414  Weight: 56 kg 54 kg 52 kg    14. Neurogenic bowel: Will monitor for now.  Last bowel movement 9/2, d/c bisacodyl suppository  -9/7 last BM today    15. Neuropathic pain- will change gabapentin to 300 mg at bedtime, decrease amitriptyline to 10mg  since not helping with sleep and we cannot increase further due to cardiac history  D/c amitriptyline since it is not helping and given cardiac risk factors  Prescribing Home Zynex NexWave Stimulator Device and supplies as needed. IFC, NMES and TENS medically necessary Treatment Rx: Daily @ 30-40 minutes per treatment PRN. Zynex NexWave only, no substitutions. Treatment Goals: 1) To reduce and/or eliminate pain 2) To improve functional capacity and Activities of daily living 3) To reduce or prevent the need for oral medications 4) To improve circulation in the injured region 5) To decrease or prevent muscle spasm and muscle atrophy 6) To provide a self-management tool to the patient The patient has not sufficiently improved with conservative care. Numerous studies indexed by Medline and PubMed.gov have shown Neuromuscular, Interferential, and TENS stimulators to reduce pain, improve function, and reduce medication use in injured patients. Continued use of this evidence based, safe, drug free treatment is both reasonable and medically necessary at this time.  -cymbalta 20mg  daily started  -9/7 continue gabapentin 300 mg at bedtime, dosing options limited by ESRD, Cymbalta 20 mg started yesterday may take a few days for full response  16. Leukocytosis: WBC reviewed and has normalized  17. Underweight: provide dietary education, monitor weights  18. Suboptimal vitamin D: will not start supplement due to  allergy to doxecalciferol. Will order grounds pass so he can have sunlight exposure.   19. Chest pain, chronic: renal is aware, continue ultrafiltration with dialysis  20.  Overnight epistaxis.  Reordered oxygen to be humidified. Resolved  21. Malaise: improved, oxygen reviewed and is satting well, weekend notes and nephrology notes reviewed, CXR and COVID test ordered to r/o infection, discussed that both are negative for new pathology  22. Bradycardia: continue to monitor HR TID, resolved  -9/7 heart rate stable in the 60s     12/10/2022    3:21 PM 12/10/2022    2:14 PM 12/10/2022    2:12 PM  Vitals with BMI  Weight  114 lbs 10 oz   BMI  14.33   Systolic 109 121 130  Diastolic 84 75 76  Pulse 64 64 64     23. Bloody clots in stool: anusol ordered, discussed to let us know if happens again, discussed possibly secondary to internal hemorrhoids  -9/7 BM today-not described as bloody  LOS: 11 days A FACE TO FACE EVALUATION WAS PERFORMED  Fanny Dance 12/10/2022, 5:15 PM

## 2022-12-10 NOTE — Progress Notes (Signed)
Received patient in bed to unit.  Alert and oriented.  Informed consent signed and in chart.   TX duration:3.5  Patient tolerated well.  Transported back to the room  Alert, without acute distress.  Hand-off given to patient's nurse.   Access used: R HD Cath Access issues: none  Total UF removed: Medication(s) given: Albumin, midodrine, Tramadol and Tylenol, see MAR   12/10/22 1412  Vitals  Temp 98.3 F (36.8 C)  Temp Source Oral  BP 117/76  MAP (mmHg) 88  Pulse Rate 64  ECG Heart Rate 65  Resp 16  MEWS COLOR  MEWS Score Color Green  Oxygen Therapy  SpO2 93 %  O2 Device Nasal Cannula  O2 Flow Rate (L/min) 3 L/min  MEWS Score  MEWS Temp 0  MEWS Systolic 0  MEWS Pulse 0  MEWS RR 0  MEWS LOC 0  MEWS Score 0     Stacie Glaze LPN [facility]

## 2022-12-10 NOTE — Progress Notes (Signed)
[facility] Progress Note   Subjective:    Seen and examined patient on HD. Tolerating UFG 2L. BP is 104/66. He reports left stump pain. Weaned down to RA.  Objective Vitals:   12/10/22 1400 12/10/22 1412 12/10/22 1414 12/10/22 1521  BP:  117/76 121/75 109/84  Pulse: 65 64 64 64  Resp:  16  18  Temp:  98.3 F (36.8 C)  98.1 F (36.7 C)  TempSrc:  Oral  Oral  SpO2: 93% 93% 98% 100%  Weight:   52 kg   Height:       Physical Exam General: Alert male in NAD, on RA Heart: RRR, no murmurs, rubs or gallops Lungs: clear anteriorly, respirations unlabored Abdomen: Soft, non-distended, +BS Extremities: 1+ edema L stump Dialysis Access: R internal jugular [facility]   Filed Weights   12/10/22 0408 12/10/22 1013 12/10/22 1414  Weight: 56 kg 54 kg 52 kg    Intake/Output Summary (Last 24 hours) at 12/10/2022 1536 Last data filed at 12/10/2022 1420 Gross per 24 hour  Intake 428 ml  Output 2000 ml  Net -1572 ml    Additional Objective Labs: Basic Metabolic Panel: Recent Labs  Lab 12/06/22 1430 12/08/22 1208 12/10/22 0654  NA 141 135 136  K 5.3* 4.5 4.8  CL 98 97* 98  CO2 24 23 25   GLUCOSE 93 87 89  BUN 70* 53* 42*  CREATININE 7.55* 6.31* 6.01*  CALCIUM 9.6 9.4 9.2  PHOS 4.0 3.5 3.0   Liver Function Tests: Recent Labs  Lab 12/06/22 1430 12/08/22 1208 12/10/22 0654  ALBUMIN 2.9* 3.1* 2.9*   No results for input(s): "LIPASE", "AMYLASE" in the last 168 hours. CBC: Recent Labs  Lab 12/05/22 0637 12/06/22 1430 12/08/22 1208 12/10/22 0654  WBC 8.9 10.2 8.2 7.6  NEUTROABS  --   --   --  5.9  HGB 8.3* 8.4* 9.1* 8.7*  HCT 29.6* 30.1* 31.9* 31.5*  MCV 97.0 97.4 98.2 98.1  PLT 179 216 203 213   Blood Culture    Component Value Date/Time   SDES BLOOD LEFT ARM 11/03/2022 0855   SPECREQUEST  11/03/2022 0855    BOTTLES DRAWN AEROBIC AND ANAEROBIC Blood Culture adequate volume   CULT  11/03/2022 0855    NO GROWTH 5 DAYS Performed at [facility]  Lab, [address] [place], [place] [address]    REPTSTATUS 11/08/2022 FINAL 11/03/2022 0855    Cardiac Enzymes: No results for input(s): "CKTOTAL", "CKMB", "CKMBINDEX", "TROPONINI" in the last 168 hours. CBG: No results for input(s): "GLUCAP" in the last 168 hours. Iron Studies: No results for input(s): "IRON", "TIBC", "TRANSFERRIN", "FERRITIN" in the last 72 hours. Lab Results  Component Value Date   INR 2.7 (H) 11/19/2022   INR 3.2 (H) 11/18/2022   INR 1.4 (H) 11/15/2022   Studies/Results: No results found.  Medications:  anticoagulant sodium citrate      acetaminophen  650 mg Oral TID WC & HS   amiodarone  200 mg Oral Daily   apixaban  2.5 mg Oral BID   bacitracin   Topical BID   Chlorhexidine Gluconate Cloth  6 each Topical Q12H   Chlorhexidine Gluconate Cloth  6 each Topical Q0600   Chlorhexidine Gluconate Cloth  6 each Topical Q0600   cinacalcet  30 mg Oral Q supper   darbepoetin (ARANESP) injection - DIALYSIS  100 mcg Subcutaneous Q Sat-1800   docusate sodium  100 mg Oral Daily   DULoxetine  20 mg Oral Daily  feeding supplement  237 mL Oral BID BM   gabapentin  300 mg Oral QHS   hydrocerin   Topical Daily   lidocaine  3 patch Transdermal Q24H   lidocaine   Topical TID   melatonin  3 mg Oral QHS   midodrine  15 mg Oral TID WC   multivitamin  1 tablet Oral QHS   polyethylene glycol  17 g Oral BID   senna-docusate  1 tablet Oral BID   sucroferric oxyhydroxide  500 mg Oral TID WC    Dialysis Orders: TTS [facility] 4h  2/2 bath  66kg   TDC   no heparin (allergy)  lock TDC w/ citrate - No ESA or VDRA - Binder: Renvela pwdr 4.8g TID - Sensipar 180mg  every day at home  Assessment/Plan: # Acute hypoxic respiratory failure: In setting of missed HD, pna w/ effusion. CXR with pleural effusion s/p chest tube 7/31 now removed. Continue UF with HD as tolerated with albumin for BP support   # PNA/ L parapneumonic pleural effusion: s/p thoracentesis with cultures. S/p IV abx  course and finished with augmentin   #Left leg wound - s/p AKA 8/15   # ESRD: continue HD per TTS schedule.  UF as able with support of albumin.  Hypotension limits UF.  - note that he will need a lower EDW given the amputation.  For now would consider ~50 kg as EDW  - fluid restrictions are in place   #Heparin allergy - use citrate for cath lock.   #Hypotension/ volume:  He is on midodrine. UF as tolerated with HD    #Anemia of ESRD: Hgb 8.7. Aranesp raised to 100 mcg every Sat for the 8/31 dose onward.  Anticipate need to further increase   # Secondary HPTH: switched to sevelamer per pharm recs.  At one point cinacalcet was held due to hypocalcemia but then resumed at 90 mg daily (half of his normal dose).  Stopped cinacalcet again on 8/26 due to hypocalcemia, Calcium now slightly high and PTH is 1125. Will restart sensipar 30mg  for now and follow trend. He was having trouble dealing with the texture of the renvela powder. Trying chewable velphoro 1 per meal.    # Hypocalcemia - see above - held sensipar, resolved, restarting at lower dose   #A-fib: On amiodarone and eliquis - per primary team; has IVC filter   # H/o type B aortic dissection s/p repair (2004)   Salome Holmes, NP [facility] 12/10/2022,3:36 PM  LOS: 11 days

## 2022-12-11 ENCOUNTER — Inpatient Hospital Stay (HOSPITAL_COMMUNITY): Payer: 59

## 2022-12-11 DIAGNOSIS — Z992 Dependence on renal dialysis: Secondary | ICD-10-CM

## 2022-12-11 DIAGNOSIS — N186 End stage renal disease: Secondary | ICD-10-CM

## 2022-12-11 LAB — TROPONIN I (HIGH SENSITIVITY)
Troponin I (High Sensitivity): 21 ng/L — ABNORMAL HIGH (ref <18)
Troponin I (High Sensitivity): 20 ng/L — ABNORMAL HIGH (ref <18)

## 2022-12-11 MED ORDER — SODIUM CHLORIDE 0.9 % IV SOLN: INTRAVENOUS | Status: DC

## 2022-12-11 NOTE — Progress Notes (Signed)
Physical Therapy Note  Patient Details  Name: Corey [name] MRN: [id number] Date of Birth: 04/09/1970 Today's Date: 12/11/2022    Pt unable to participate w/ therapy this date, c/o fatigue, weakness and "not feeling well."  Pt missed 75 min of pt services.   Lucio Edward 12/11/2022, 2:30 PM

## 2022-12-11 NOTE — Progress Notes (Signed)
Occupational Therapy Note  Patient Details  Name: ZAEDEN SELDERS MRN: [id number] Date of Birth: 06/17/1970  Today's Date: 12/11/2022    and Today's Date: 12/11/2022 OT Missed Time: 45 Minutes Missed Time Reason: Patient fatigue (Pt reports severe fatigue from dialysis). Pt reports not feeling well d/t dialysis yesterday. Will make up time as able.   Velia Meyer, OTD, OTR/L 12/11/2022, 3:37 PM

## 2022-12-11 NOTE — Progress Notes (Addendum)
PROGRESS NOTE   Subjective/Complaints: Patient felt tired this morning, he feels that may be too much fluid was removed during dialysis.  Nephrology gave him 500 cc cc normal saline bolus.  Addendum, later in afternoon reported to have chest pain   ROS: +Phantom limb pain- continues, nosebleeds, +malaise  Denies fevers, chills, N/V, abdominal pain, constipation, diarrhea, SOB, cough, chest pain, new weakness or paraesthesias.  +bloody clots with stool last night Review of Systems  Constitutional:  Positive for malaise/fatigue. Negative for chills and fever.  HENT:  Negative for congestion.   Respiratory:  Negative for shortness of breath.   Cardiovascular:  Negative for chest pain.  Gastrointestinal:  Negative for abdominal pain, nausea and vomiting.  Musculoskeletal:  Positive for joint pain.  Neurological:  Positive for weakness.     Objective:   No results found. Recent Labs    12/10/22 0654  WBC 7.6  HGB 8.7*  HCT 31.5*  PLT 213   Recent Labs    12/10/22 0654  NA 136  K 4.8  CL 98  CO2 25  GLUCOSE 89  BUN 42*  CREATININE 6.01*  CALCIUM 9.2    Intake/Output Summary (Last 24 hours) at 12/11/2022 1656 Last data filed at 12/11/2022 0900 Gross per 24 hour  Intake 240 ml  Output --  Net 240 ml        Physical Exam: Vital Signs Blood pressure 107/71, pulse 60, temperature 98.2 F (36.8 C), temperature source Oral, resp. rate 20, height 6\' 3"  (1.905 m), weight 52 kg, SpO2 100%. Gen: no distress, lying in bed Eyes: Pupils equal and round.  Looks in general direction.  Says can see shapes, but nothing clear  Cardiovascular:    RRR Pulmonary: CTAB, nonlabored Abdominal:     General: Abdomen is flat. There is no distension.     Palpations: Abdomen is soft.     Tenderness: There is no abdominal tenderness.  Musculoskeletal:        General: No swelling or tenderness.     Cervical back: Neck supple. No  tenderness.     Comments: Amputation site well wrapped in figure-of-eight for Ace bandage, loose and dog-eared; rewrapped  Right foot with overgrown toe nails.   Ues 5-/5 B/L RLE- HF 4-/5; otherwise DF 0/5 and PF and KE 2-/5 LLE- L AKA- with HF 4-/5 Skin:     General: Skin is warm and dry.     Comments: Boggy area infra clavicular area--non tender and reported to be stable. Multiple grafts RUE--non functional. R-internal jugular noted. RLE with areas of discoloration and stasis changes.  1+ edema right residual limb. R HD catheter looks OK   Leg/ L AKA- tip elevated on pillow-dressing with some dried blood, staples appear well-approximated and intact.  No apparent drainage or edema.  Healing well.  Neurological:     Mental Status: Sleeping initially but wakes to voice.  He is alert and oriented to person, place, and time.     Comments: Decreased to absent below R knee  Psychiatric:     Cooperative and appropriate  Assessment/Plan: 1. Functional deficits which require 3+ hours per day of interdisciplinary therapy in a comprehensive inpatient  rehab setting. Physiatrist is providing close team supervision and 24 hour management of active medical problems listed below. Physiatrist and rehab team continue to assess barriers to discharge/monitor patient progress toward functional and medical goals  Care Tool:  Bathing    Body parts bathed by patient: Right arm, Left arm, Chest, Abdomen, Face (UB bathing only)     Body parts n/a: Front perineal area, Buttocks, Right upper leg, Left upper leg, Right lower leg, Left lower leg   Bathing assist Assist Level: Supervision/Verbal cueing     Upper Body Dressing/Undressing Upper body dressing   What is the patient wearing?: Pull over shirt    Upper body assist Assist Level: Set up assist    Lower Body Dressing/Undressing Lower body dressing      What is the patient wearing?: Ace wrap/stump shrinker     Lower body assist Assist for  lower body dressing: Minimal Assistance - Patient > 75%     Toileting Toileting    Toileting assist Assist for toileting: Moderate Assistance - Patient 50 - 74%     Transfers Chair/bed transfer  Transfers assist  Chair/bed transfer activity did not occur: Safety/medical concerns  Chair/bed transfer assist level: Supervision/Verbal cueing (slideboard)     Locomotion Ambulation   Ambulation assist   Ambulation activity did not occur: Safety/medical concerns (pain, fear of falling, decreased balance, protection of contralateral limb)          Walk 10 feet activity   Assist  Walk 10 feet activity did not occur: Safety/medical concerns (pain, fear of falling, decreased balance, protection of contralateral limb)        Walk 50 feet activity   Assist Walk 50 feet with 2 turns activity did not occur: Safety/medical concerns (pain, fear of falling, decreased balance, protection of contralateral limb)         Walk 150 feet activity   Assist Walk 150 feet activity did not occur: Safety/medical concerns (pain, fear of falling, decreased balance, protection of contralateral limb)         Walk 10 feet on uneven surface  activity   Assist Walk 10 feet on uneven surfaces activity did not occur: Safety/medical concerns (pain, fear of falling, decreased balance, protection of contralateral limb)         Wheelchair     Assist Is the patient using a wheelchair?: Yes Type of Wheelchair: Manual Wheelchair activity did not occur: Safety/medical concerns (pain)  Wheelchair assist level: Supervision/Verbal cueing Max wheelchair distance: 100    Wheelchair 50 feet with 2 turns activity    Assist    Wheelchair 50 feet with 2 turns activity did not occur: Safety/medical concerns (pain)   Assist Level: Supervision/Verbal cueing   Wheelchair 150 feet activity     Assist  Wheelchair 150 feet activity did not occur: Safety/medical concerns (pain)        Blood pressure 107/71, pulse 60, temperature 98.2 F (36.8 C), temperature source Oral, resp. rate 20, height 6\' 3"  (1.905 m), weight 52 kg, SpO2 100%.  Medical Problem List and Plan: 1. Functional deficits secondary to  L AKA secondary to left foot wound 2/2 PAD             -patient may not shower due to R HD catheter             -ELOS/Goals: 15 days supervision, extended to Wednesday  -Continue CIR  2.  Antithrombotics: -DVT/anticoagulation:  Pharmaceutical: Continue Eliquis. IVC filter in place.              -  antiplatelet therapy: N/A  3. Penile pain: topical bacitracin ordered, continue  4. Insomnia complicated by phantom pain: d/c amitriptyline since not helping with his sleep and cannot increase further given cardiac history. Add melatonin 3mg  HS  -Continues to be limited by phantom pain with difficulty sleeping; already getting as needed Ambien, would not further dose increase sleep medications  5. Neuropsych/cognition: This patient is capable of making decisions on his own behalf. 6. Skin/Wound Care: Routine pressure relief measure 7. Fluids/Electrolytes/Nutrition:  Strict I/O. Daily weights. Diet changed to Renal per input from nephrology. 8. L-AKA: Monitor incision for healing. Dry dressing. Shrinkers ordered  -8-31, 9/1: Per patient, not tolerating shrinkers s, but tolerating figure-of-eight ace wrap.  Continue.  9. ESRD: Renal diet with 1200 cc FR/HD TTS at the end of the day to help with tolerance of therapy.   -9/8 nephrology ordered 500 normal saline bolus today 10. H/o AAA aneurysm rupture s/p repair:  with resultant paraplegia, sensory deficits from saddle down and cauda equina syndrome             --was able to walk till a few years ago when left knee gave out.              --has severe LLQ pain prior to defecation.Continue Senna/miralax--discussed suppository every other day but patient wants to think on it "that's too much"  11. Anemia of chronic disease:] 12.  L- PNA and parapneumonic effusion s/p CT: Has completed 2 week course of antibiotics.  13. Fluid overload: Continue to monitor for symptoms and wean oxygen as tolerated.   -Weights up to baseline on 9/2 Filed Weights   12/10/22 0408 12/10/22 1013 12/10/22 1414  Weight: 56 kg 54 kg 52 kg    14. Neurogenic bowel: Will monitor for now.  Last bowel movement 9/2, d/c bisacodyl suppository  -9/8 last BM yesterday, continue to monitor    15. Neuropathic pain- will change gabapentin to 300 mg at bedtime, decrease amitriptyline to 10mg  since not helping with sleep and we cannot increase further due to cardiac history  D/c amitriptyline since it is not helping and given cardiac risk factors  Prescribing Home Zynex NexWave Stimulator Device and supplies as needed. IFC, NMES and TENS medically necessary Treatment Rx: Daily @ 30-40 minutes per treatment PRN. Zynex NexWave only, no substitutions. Treatment Goals: 1) To reduce and/or eliminate pain 2) To improve functional capacity and Activities of daily living 3) To reduce or prevent the need for oral medications 4) To improve circulation in the injured region 5) To decrease or prevent muscle spasm and muscle atrophy 6) To provide a self-management tool to the patient The patient has not sufficiently improved with conservative care. Numerous studies indexed by Medline and PubMed.gov have shown Neuromuscular, Interferential, and TENS stimulators to reduce pain, improve function, and reduce medication use in injured patients. Continued use of this evidence based, safe, drug free treatment is both reasonable and medically necessary at this time.  -cymbalta 20mg  daily started  -9/7 continue gabapentin 300 mg at bedtime, dosing options limited by ESRD, Cymbalta 20 mg started yesterday may take a few days for full response  -9/8 discussed may take a few days for Cymbalta take full effect  16. Leukocytosis: WBC reviewed and has normalized  17. Underweight: provide  dietary education, monitor weights  18. Suboptimal vitamin D: will not start supplement due to allergy to doxecalciferol. Will order grounds pass so he can have sunlight exposure.   19. Chest pain, chronic: renal  is aware, continue ultrafiltration with dialysis  -9/8 called regarding chest discomfort.  Discomfort worsened with deep breathing however no shortness of breath.  EKG/troponins ordered.  Will check chest x-ray  Addendum, pt was noted to  have some chest wall soreness right side, can try lidocaine patch to this area  20.  Overnight epistaxis.  Reordered oxygen to be humidified. Resolved  21. Malaise: improved, oxygen reviewed and is satting well, weekend notes and nephrology notes reviewed, CXR and COVID test ordered to r/o infection, discussed that both are negative for new pathology  22. Bradycardia: continue to monitor HR TID, resolved  -9/7-8 heart rate stable in the 60s      12/11/2022    3:57 PM 12/11/2022    3:12 AM 12/10/2022    9:54 PM  Vitals with BMI  Systolic 107 106 132  Diastolic 71 70 67  Pulse 60 64 64     23. Bloody clots in stool: anusol ordered, discussed to let us know if happens again, discussed possibly secondary to internal hemorrhoids  -9/7 BM today-not described as bloody  LOS: 12 days A FACE TO FACE EVALUATION WAS PERFORMED  Fanny Dance 12/11/2022, 4:56 PM

## 2022-12-11 NOTE — Progress Notes (Addendum)
[facility] Progress Note   Subjective:    Seen and examined patient at bedside. He reports feeling weak and dizzy since HD yesterday. Noted net UF of 2L. Blood pressures on softer side and on Midodrine. Plan to give him a NS bolus today.  Objective Vitals:   12/10/22 1521 12/10/22 1952 12/10/22 2154 12/11/22 0312  BP: 109/84 (!) 92/57 105/67 106/70  Pulse: 64 63 64 64  Resp: 18   18  Temp: 98.1 F (36.7 C) 97.6 F (36.4 C)  98.5 F (36.9 C)  TempSrc: Oral Oral    SpO2: 100% 100%  100%  Weight:      Height:       Physical Exam General: Awake, alert, feels weak and dizzy, NAD, on RA Heart: RRR, no murmurs, rubs or gallops Lungs: clear anteriorly, respirations unlabored Abdomen: Soft, non-distended, +BS Extremities: No edema L stump or RLE Dialysis Access: R internal jugular [facility]   Filed Weights   12/10/22 0408 12/10/22 1013 12/10/22 1414  Weight: 56 kg 54 kg 52 kg    Intake/Output Summary (Last 24 hours) at 12/11/2022 1430 Last data filed at 12/11/2022 0900 Gross per 24 hour  Intake 240 ml  Output --  Net 240 ml    Additional Objective Labs: Basic Metabolic Panel: Recent Labs  Lab 12/06/22 1430 12/08/22 1208 12/10/22 0654  NA 141 135 136  K 5.3* 4.5 4.8  CL 98 97* 98  CO2 24 23 25   GLUCOSE 93 87 89  BUN 70* 53* 42*  CREATININE 7.55* 6.31* 6.01*  CALCIUM 9.6 9.4 9.2  PHOS 4.0 3.5 3.0   Liver Function Tests: Recent Labs  Lab 12/06/22 1430 12/08/22 1208 12/10/22 0654  ALBUMIN 2.9* 3.1* 2.9*   No results for input(s): "LIPASE", "AMYLASE" in the last 168 hours. CBC: Recent Labs  Lab 12/05/22 0637 12/06/22 1430 12/08/22 1208 12/10/22 0654  WBC 8.9 10.2 8.2 7.6  NEUTROABS  --   --   --  5.9  HGB 8.3* 8.4* 9.1* 8.7*  HCT 29.6* 30.1* 31.9* 31.5*  MCV 97.0 97.4 98.2 98.1  PLT 179 216 203 213   Blood Culture    Component Value Date/Time   SDES BLOOD LEFT ARM 11/03/2022 0855   SPECREQUEST  11/03/2022 0855    BOTTLES DRAWN  AEROBIC AND ANAEROBIC Blood Culture adequate volume   CULT  11/03/2022 0855    NO GROWTH 5 DAYS Performed at [facility], [address], [place], [place] [address]    REPTSTATUS 11/08/2022 FINAL 11/03/2022 0855    Cardiac Enzymes: No results for input(s): "CKTOTAL", "CKMB", "CKMBINDEX", "TROPONINI" in the last 168 hours. CBG: No results for input(s): "GLUCAP" in the last 168 hours. Iron Studies: No results for input(s): "IRON", "TIBC", "TRANSFERRIN", "FERRITIN" in the last 72 hours. Lab Results  Component Value Date   INR 2.7 (H) 11/19/2022   INR 3.2 (H) 11/18/2022   INR 1.4 (H) 11/15/2022   Studies/Results: No results found.  Medications:  anticoagulant sodium citrate      acetaminophen  650 mg Oral TID WC & HS   amiodarone  200 mg Oral Daily   apixaban  2.5 mg Oral BID   bacitracin   Topical BID   Chlorhexidine Gluconate Cloth  6 each Topical Q12H   Chlorhexidine Gluconate Cloth  6 each Topical Q0600   Chlorhexidine Gluconate Cloth  6 each Topical Q0600   cinacalcet  30 mg Oral Q supper   darbepoetin (ARANESP) injection - DIALYSIS  100 mcg Subcutaneous Q Sat-1800   docusate sodium  100 mg Oral Daily   DULoxetine  20 mg Oral Daily   feeding supplement  237 mL Oral BID BM   gabapentin  300 mg Oral QHS   hydrocerin   Topical Daily   lidocaine  3 patch Transdermal Q24H   lidocaine   Topical TID   melatonin  3 mg Oral QHS   midodrine  15 mg Oral TID WC   multivitamin  1 tablet Oral QHS   polyethylene glycol  17 g Oral BID   senna-docusate  1 tablet Oral BID   sucroferric oxyhydroxide  500 mg Oral TID WC    Dialysis Orders: TTS [facility] 4h  2/2 bath  66kg   TDC   no heparin (allergy)  lock TDC w/ citrate - No ESA or VDRA - Binder: Renvela pwdr 4.8g TID - Sensipar 180mg  every day at home  Assessment/Plan: # Acute hypoxic respiratory failure: In setting of missed HD, pna w/ effusion. CXR with pleural effusion s/p chest tube 7/31 now removed. Continue UF with HD  as tolerated with albumin for BP support   # PNA/ L parapneumonic pleural effusion: s/p thoracentesis with cultures. S/p IV abx course and finished with augmentin   #Left leg wound - s/p AKA 8/15   # ESRD: continue HD per TTS schedule.  UF as able with support of albumin.  Hypotension limits UF.  - note that he will need a lower EDW given the amputation.  For now would consider ~50 kg as EDW  - fluid restrictions are in place -he reports feeling weak and dizzy since HD yestreday. Blood pressures are on the softer side and on Midodrine. Doesn't appear overloaded and appears alittle dry. Will give him a NS bolus today. Will also repeat another CXR tomorrow AM. May need to run him even at next HD.   #Heparin allergy - use citrate for cath lock.   #Hypotension/ volume:  He is on midodrine. UF as tolerated with HD    #Anemia of ESRD: Hgb 8.7. Aranesp raised to 100 mcg every Sat for the 8/31 dose onward.  Anticipate need to further increase   # Secondary HPTH: switched to sevelamer per pharm recs.  At one point cinacalcet was held due to hypocalcemia but then resumed at 90 mg daily (half of his normal dose).  Stopped cinacalcet again on 8/26 due to hypocalcemia, Calcium now slightly high and PTH is 1125. Will restart sensipar 30mg  for now and follow trend. He was having trouble dealing with the texture of the renvela powder. Trying chewable velphoro 1 per meal.    # Hypocalcemia - see above - held sensipar, resolved, restarting at lower dose   #A-fib: On amiodarone and eliquis - per primary team; has IVC filter   # H/o type B aortic dissection s/p repair (2004)    Salome Holmes, NP [facility] 12/11/2022,2:30 PM  LOS: 12 days

## 2022-12-12 NOTE — Progress Notes (Signed)
Occupational Therapy Session Note  Patient Details  Name: COE SERIGHT MRN: [id number] Date of Birth: Jan 26, 1971  Today's Date: 12/12/2022 OT Individual Time: 1445-1530 OT Individual Time Calculation (min): 45 min    Short Term Goals: Week 2:  OT Short Term Goal 1 (Week 2): LTG= STGs  Skilled Therapeutic Interventions/Progress Updates:   Pt asleep in w/c upon OT arrival. P was on 2 ltrs O2 via [place] with Spo2 97%. Self propelled w/c 25 ft x 3 sets with seated rests and cues for breathing recovery rests with O2 sats 93% on portable O2 2 ltrs. Once OT completed transport to apt, pt educated on seated push ups and weight shifts for skin protection and pain mngt. 3 reps x 3 sets with rest. Removed O2 for trial tband for triceps x 2 sets 10 reps with cues for breathing with O2 sats to 89%. Returned O2 to pt and assisted back to room. Sats on wall O2 2 ltrs 98%.    Pain: 7/10 L LE and L chest tube site with relief with rest and repositioning   Therapy Documentation Precautions:  Precautions Precautions: Fall, Other (comment) Precaution Comments: watch BP; blind in L eye Restrictions Weight Bearing Restrictions: Yes LLE Weight Bearing: Non weight bearing     Therapy/Group: Individual Therapy  Vicenta Dunning 12/12/2022, 7:42 AM

## 2022-12-12 NOTE — Progress Notes (Signed)
PROGRESS NOTE   Subjective/Complaints: No new complaints this morning Says he felt unwell over the weekend and felt that too much fluids was removed.  No complaints otherwise this morning   ROS: +Phantom limb pain- continues, nosebleeds, +malaise- feels too much fluid was removed with dialysis  Denies fevers, chills, N/V, abdominal pain, constipation, diarrhea, SOB, cough, chest pain, new weakness or paraesthesias.   Review of Systems  Constitutional:  Positive for malaise/fatigue. Negative for chills and fever.  HENT:  Negative for congestion.   Respiratory:  Negative for shortness of breath.   Cardiovascular:  Negative for chest pain.  Gastrointestinal:  Negative for abdominal pain, nausea and vomiting.  Musculoskeletal:  Positive for joint pain.  Neurological:  Positive for weakness.     Objective:   DG CHEST PORT 1 VIEW  Result Date: 12/11/2022 CLINICAL DATA:  Fatigue and shortness of breath. EXAM: PORTABLE CHEST 1 VIEW COMPARISON:  Chest radiograph dated 12/05/2022. FINDINGS: Dialysis catheter in similar position. There is cardiomegaly with vascular congestion and possible edema. Overall no significant interval change in the appearance of the lungs since the prior radiograph. No pneumothorax. No acute osseous pathology. IMPRESSION: No significant interval change since the prior radiograph. Electronically Signed   By: Elgie Collard M.D.   On: 12/11/2022 20:29   Recent Labs    12/10/22 0654  WBC 7.6  HGB 8.7*  HCT 31.5*  PLT 213   Recent Labs    12/10/22 0654  NA 136  K 4.8  CL 98  CO2 25  GLUCOSE 89  BUN 42*  CREATININE 6.01*  CALCIUM 9.2    Intake/Output Summary (Last 24 hours) at 12/12/2022 1141 Last data filed at 12/12/2022 0900 Gross per 24 hour  Intake 740.46 ml  Output --  Net 740.46 ml        Physical Exam: Vital Signs Blood pressure 109/70, pulse 63, temperature 98.1 F (36.7 C),  temperature source Oral, resp. rate 18, height 6\' 3"  (1.905 m), weight 57 kg, SpO2 100%. Gen: no distress, lying in bed, BMI 15.71 Eyes: Pupils equal and round.  Looks in general direction.  Says can see shapes, but nothing clear  Cardiovascular:    RRR Pulmonary: CTAB, nonlabored Abdominal:     General: Abdomen is flat. There is no distension.     Palpations: Abdomen is soft.     Tenderness: There is no abdominal tenderness.  Musculoskeletal:        General: No swelling or tenderness.     Cervical back: Neck supple. No tenderness.     Comments: Amputation site well wrapped in figure-of-eight for Ace bandage, loose and dog-eared; rewrapped  Right foot with overgrown toe nails.   Ues 5-/5 B/L RLE- HF 4-/5; otherwise DF 0/5 and PF and KE 2-/5 LLE- L AKA- with HF 4-/5 Skin:     General: Skin is warm and dry.     Comments: Boggy area infra clavicular area--non tender and reported to be stable. Multiple grafts RUE--non functional. R-internal jugular noted. RLE with areas of discoloration and stasis changes.  1+ edema right residual limb. R HD catheter looks OK   Leg/ L AKA- tip elevated on pillow-dressing with  some dried blood, staples appear well-approximated and intact.  No apparent drainage or edema.  Healing well.  Neurological:     Mental Status: Sleeping initially but wakes to voice.  He is alert and oriented to person, place, and time.     Comments: Decreased to absent below R knee  Psychiatric:     Cooperative and appropriate  Assessment/Plan: 1. Functional deficits which require 3+ hours per day of interdisciplinary therapy in a comprehensive inpatient rehab setting. Physiatrist is providing close team supervision and 24 hour management of active medical problems listed below. Physiatrist and rehab team continue to assess barriers to discharge/monitor patient progress toward functional and medical goals  Care Tool:  Bathing    Body parts bathed by patient: Right arm,  Left arm, Chest, Abdomen, Face (UB bathing only)     Body parts n/a: Front perineal area, Buttocks, Right upper leg, Left upper leg, Right lower leg, Left lower leg   Bathing assist Assist Level: Supervision/Verbal cueing     Upper Body Dressing/Undressing Upper body dressing   What is the patient wearing?: Pull over shirt    Upper body assist Assist Level: Set up assist    Lower Body Dressing/Undressing Lower body dressing      What is the patient wearing?: Ace wrap/stump shrinker     Lower body assist Assist for lower body dressing: Minimal Assistance - Patient > 75%     Toileting Toileting    Toileting assist Assist for toileting: Moderate Assistance - Patient 50 - 74%     Transfers Chair/bed transfer  Transfers assist  Chair/bed transfer activity did not occur: Safety/medical concerns  Chair/bed transfer assist level: Supervision/Verbal cueing (slideboard)     Locomotion Ambulation   Ambulation assist   Ambulation activity did not occur: Safety/medical concerns (pain, fear of falling, decreased balance, protection of contralateral limb)          Walk 10 feet activity   Assist  Walk 10 feet activity did not occur: Safety/medical concerns (pain, fear of falling, decreased balance, protection of contralateral limb)        Walk 50 feet activity   Assist Walk 50 feet with 2 turns activity did not occur: Safety/medical concerns (pain, fear of falling, decreased balance, protection of contralateral limb)         Walk 150 feet activity   Assist Walk 150 feet activity did not occur: Safety/medical concerns (pain, fear of falling, decreased balance, protection of contralateral limb)         Walk 10 feet on uneven surface  activity   Assist Walk 10 feet on uneven surfaces activity did not occur: Safety/medical concerns (pain, fear of falling, decreased balance, protection of contralateral limb)         Wheelchair     Assist Is the  patient using a wheelchair?: Yes Type of Wheelchair: Manual Wheelchair activity did not occur: Safety/medical concerns (pain)  Wheelchair assist level: Supervision/Verbal cueing Max wheelchair distance: 100    Wheelchair 50 feet with 2 turns activity    Assist    Wheelchair 50 feet with 2 turns activity did not occur: Safety/medical concerns (pain)   Assist Level: Supervision/Verbal cueing   Wheelchair 150 feet activity     Assist  Wheelchair 150 feet activity did not occur: Safety/medical concerns (pain)       Blood pressure 109/70, pulse 63, temperature 98.1 F (36.7 C), temperature source Oral, resp. rate 18, height 6\' 3"  (1.905 m), weight 57 kg, SpO2 100%.  Medical Problem List and Plan: 1. Functional deficits secondary to  L AKA secondary to left foot wound 2/2 PAD             -patient may not shower due to R HD catheter             -ELOS/Goals: 15 days supervision, extended to Wednesday  -Continue CIR  2.  Antithrombotics: -DVT/anticoagulation:  Pharmaceutical: Continue Eliquis. IVC filter in place.              -antiplatelet therapy: N/A  3. Penile pain: topical bacitracin ordered, continue  4. Insomnia complicated by phantom pain: d/c amitriptyline since not helping with his sleep and cannot increase further given cardiac history. Add melatonin 3mg  HS  -Continues to be limited by phantom pain with difficulty sleeping; already getting as needed Ambien, would not further dose increase sleep medications  5. Neuropsych/cognition: This patient is capable of making decisions on his own behalf. 6. Skin/Wound Care: Routine pressure relief measure 7. Fluids/Electrolytes/Nutrition:  Strict I/O. Daily weights. Diet changed to Renal per input from nephrology. 8. L-AKA: Monitor incision for healing. Dry dressing. Shrinkers ordered  -8-31, 9/1: Per patient, not tolerating shrinkers s, but tolerating figure-of-eight ace wrap.  Continue.  9. ESRD: Renal diet with 1200 cc  FR/HD TTS at the end of the day to help with tolerance of therapy.   -9/8 nephrology ordered 500 normal saline bolus today 10. H/o AAA aneurysm rupture s/p repair:  with resultant paraplegia, sensory deficits from saddle down and cauda equina syndrome             --was able to walk till a few years ago when left knee gave out.              --has severe LLQ pain prior to defecation.Continue Senna/miralax--discussed suppository every other day but patient wants to think on it "that's too much"  11. Anemia of chronic disease:] 12. L- PNA and parapneumonic effusion s/p CT: Has completed 2 week course of antibiotics.  13. Fluid overload: Continue to monitor for symptoms and wean oxygen as tolerated.   -Weights up to baseline on 9/2 Filed Weights   12/10/22 1013 12/10/22 1414 12/12/22 0500  Weight: 54 kg 52 kg 57 kg    14. Neurogenic bowel: Will monitor for now.  Last bowel movement 9/2, d/c bisacodyl suppository  -9/8 last BM yesterday, continue to monitor    15. Neuropathic pain- will change gabapentin to 300 mg at bedtime, decrease amitriptyline to 10mg  since not helping with sleep and we cannot increase further due to cardiac history  D/c amitriptyline since it is not helping and given cardiac risk factors  Prescribing Home Zynex NexWave Stimulator Device and supplies as needed. IFC, NMES and TENS medically necessary Treatment Rx: Daily @ 30-40 minutes per treatment PRN. Zynex NexWave only, no substitutions. Treatment Goals: 1) To reduce and/or eliminate pain 2) To improve functional capacity and Activities of daily living 3) To reduce or prevent the need for oral medications 4) To improve circulation in the injured region 5) To decrease or prevent muscle spasm and muscle atrophy 6) To provide a self-management tool to the patient The patient has not sufficiently improved with conservative care. Numerous studies indexed by Medline and PubMed.gov have shown Neuromuscular, Interferential, and TENS  stimulators to reduce pain, improve function, and reduce medication use in injured patients. Continued use of this evidence based, safe, drug free treatment is both reasonable and medically necessary at this time.  -cymbalta  20mg  daily started  -9/7 continue gabapentin 300 mg at bedtime, dosing options limited by ESRD, Cymbalta 20 mg started yesterday may take a few days for full response  -9/8 discussed may take a few days for Cymbalta take full effect  16. Leukocytosis: WBC reviewed and has normalized  17. Underweight: provide dietary education, monitor weights  18. Suboptimal vitamin D: will not start supplement due to allergy to doxecalciferol. Will order grounds pass so he can have sunlight exposure.   19. Chest pain, chronic: renal is aware, continue ultrafiltration with dialysis  -9/8 called regarding chest discomfort.  Discomfort worsened with deep breathing however no shortness of breath.  EKG/troponins ordered.  Will check chest x-ray  Addendum, pt was noted to  have some chest wall soreness right side, can try lidocaine patch to this area, continue  20.  Overnight epistaxis.  Reordered oxygen to be humidified. Resolved  21. Malaise: discussed that he feels too much fluid was removed with dialysis, oxygen reviewed and is satting well, weekend notes and nephrology notes reviewed, CXR and COVID test ordered to r/o infection, discussed that both are negative for new pathology  22. Bradycardia: continue to monitor HR TID, resolved      12/12/2022    5:00 AM 12/12/2022    3:47 AM 12/11/2022    7:29 PM  Vitals with BMI  Weight 125 lbs 11 oz    BMI 15.71    Systolic  109 108  Diastolic  70 73  Pulse  63 60     23. Bloody clots in stool: anusol ordered, continue, discussed to let us know if happens again, discussed possibly secondary to internal hemorrhoids  -9/7 BM today-not described as bloody  LOS: 13 days A FACE TO FACE EVALUATION WAS PERFORMED  Corey [name] 12/12/2022,  11:41 AM

## 2022-12-12 NOTE — Discharge Summary (Signed)
Physician Discharge Summary  Patient ID: Corey [name] MRN: [id number] DOB/AGE: [birth year] 52 y.o.  Admit date: 11/29/2022 Discharge date: 12/14/2022  Discharge Diagnoses:  Principal Problem:   S/P AKA (above knee amputation), left (HCC) Active Problems:   ESRD on dialysis (HCC)   Long term (current) use of anticoagulants [Z79.01]   Chest pain   Thoracoabdominal aortic aneurysm (TAAA) without rupture (HCC)   Chronic diastolic CHF (congestive heart failure) (HCC)   Atrial fibrillation, chronic (HCC)   End-stage renal disease on hemodialysis (HCC)   Peripheral neuropathy   Lobar pneumonia (HCC)   Protein-calorie malnutrition, severe   Debility   Arthritis   Discharged Condition: stable  Significant Diagnostic Studies: DG CHEST PORT 1 VIEW  Result Date: 12/11/2022 CLINICAL DATA:  Fatigue and shortness of breath. EXAM: PORTABLE CHEST 1 VIEW COMPARISON:  Chest radiograph dated 12/05/2022. FINDINGS: Dialysis catheter in similar position. There is cardiomegaly with vascular congestion and possible edema. Overall no significant interval change in the appearance of the lungs since the prior radiograph. No pneumothorax. No acute osseous pathology. IMPRESSION: No significant interval change since the prior radiograph. Electronically Signed   By: Elgie Collard M.D.   On: 12/11/2022 20:29   DG Chest 2 View  Result Date: 12/05/2022 CLINICAL DATA:  Shortness of breath. EXAM: CHEST - 2 VIEW COMPARISON:  11/28/2022 FINDINGS: Right-sided dialysis catheter remains in place. Stable heart size and mediastinal contours. Similar fine interstitial opacities suspicious for pulmonary edema. Bandlike areas of scarring in the lung bases, left greater than right, unchanged. No large pleural effusion or pneumothorax. Advanced vascular calcifications. IMPRESSION: No change from prior exam. Similar fine interstitial opacities suspicious for pulmonary edema. Electronically Signed   By: Narda Rutherford M.D.   On:  12/05/2022 21:17       Labs:  Basic Metabolic Panel: Recent Labs  Lab 12/08/22 1208 12/10/22 0654  NA 135 136  K 4.5 4.8  CL 97* 98  CO2 23 25  GLUCOSE 87 89  BUN 53* 42*  CREATININE 6.31* 6.01*  CALCIUM 9.4 9.2  PHOS 3.5 3.0    CBC: Recent Labs  Lab 12/08/22 1208 12/10/22 0654  WBC 8.2 7.6  NEUTROABS  --  5.9  HGB 9.1* 8.7*  HCT 31.9* 31.5*  MCV 98.2 98.1  PLT 203 213    CBG: No results for input(s): "GLUCAP" in the last 168 hours.  Brief HPI:   Corey [name] is a 52 y.o. male with history of ESRD, Stargardt's disease, PAF, aortic dissection s/p repair, PAD, and neuropathy, heel ulcers; who was admitted on 10/27/2022 with hypoxic respiratory failure, hyperkalemia and complex moderate to large left pleural effusion with reports of having missed hemodialysis sessions x 2.  He underwent emergent hemodialysis and once INR trended downwards chest tube was placed on 07/31.  PNA was treated with 10-day course of antibiotics.  Fluid felt to be parapneumonic with negative cultures.    He has had issues with hypotension requiring midodrine for BP support.  He did develop new left heel wound and was agreeable for left AKA on 08/15.[name]  He was transition to Eliquis and continued to require supplemental oxygen due to issues with ongoing hypoxia.  Chest x-ray on 08/26 showed pulmonary edema and he underwent hemodialysis with IV albumin to assist to KG goal.  Patient was noted to be significantly debilitated due to medical issues and was working on sitting tolerance at edge of bed.  CIR was recommended due to functional decline. [name]  Hospital  Course: Corey [name] was admitted to rehab 11/29/2022 for inpatient therapies to consist of PT and OT at least three hours five days a week. Past admission physiatrist, therapy team and rehab RN have worked together to provide customized collaborative inpatient rehab. Blood pressures were monitored on TID basis and was noted to be stable with midodrine on  board. Episode of rectal bleeding felt to e due to constipation and has resolved with use of anusol.  He did develop mild epistaxis and afrin added X 3 days as well as saline nose drops to help maintain hydration.   L-AKA incision is healing well and shrinkers were ordered to help with edema but he was unable to tolerate them so figure 8 ace wraps started which were tolerable.  He continued to have chronic chest pain. He did report worsening of symptoms on 09/08 and EKG/troponin were negative. He was covid negative on testing. CBC showed H/H to be stable and leucocytosis has resolved. He is tolerating HD on TTS.  Postop pain management with as needed use of tramadol.  He continued to require 2 L oxygen and  follow up CXR showed  no significant changes.  Miralax was added to help manage constipation. He continued to be limited by neuropathic pain and low dose elavil added without improvement in symptoms therefore it was discontinued. Gabapentin was added and titrated to 300 mg HS. Cymbalta 20 mg was added additionally. Due to ongoing symptoms, Zynex ordered and he has been set for Qutenza treatment after discharge.  He has made good gains and supervision is recommended for safety.  He will continue to receive follow-up home health PT and OT after discharge.     Rehab course: During patient's stay in rehab weekly team conferences were held to monitor patient's progress, set goals and discuss barriers to discharge. At admission, patient required min assist with mobility and mod assist with ADL tasks. He  has had improvement in activity tolerance, balance, postural control as well as ability to compensate for deficits.  He is able to complete ADL tasks with supervision.  He is independent for lateral scoot transfers with use of sliding board.  Family education has been completed.  Disposition:  Home  Diet: Carb Modified/Renal/Limit fluids to 5 cups per day.(1200 cc)  Special Instructions: Has been set for  Qutenza 01/10/23 at 10 am.   Discharge Instructions     Ambulatory referral to Physical Medicine Rehab   Complete by: As directed       Allergies as of 12/14/2022       Reactions   Ciprofloxacin Other (See Comments)   Aortic dissection   Heparin Other (See Comments)   UNSPECIFIED REACTION :  On Coumadin since 2004 HIT panel negative 01/19/17   Doxercalciferol Other (See Comments)   Tomato    Quinolones Other (See Comments)   unknown        Medication List     STOP taking these medications    feeding supplement Liqd   Oxycodone HCl 10 MG Tabs   polyethylene glycol 17 g packet Commonly known as: MIRALAX / GLYCOLAX Replaced by: polyethylene glycol powder 17 GM/SCOOP powder   Renvela 2.4 g Pack Generic drug: sevelamer carbonate       TAKE these medications    acetaminophen 325 MG tablet Commonly known as: TYLENOL Take 2 tablets (650 mg total) by mouth 4 (four) times daily -  with meals and at bedtime.   amiodarone 200 MG tablet Commonly known as: PACERONE  Take 1 tablet (200 mg total) by mouth daily.   camphor-menthol lotion Commonly known as: SARNA Apply 1 Application topically every 8 (eight) hours as needed for itching.   cinacalcet 30 MG tablet Commonly known as: SENSIPAR Take 1 tablet (30 mg total) by mouth daily with supper.   diclofenac Sodium 1 % Gel Commonly known as: VOLTAREN Apply 2 g topically 4 (four) times daily as needed (Arthritis).   docusate sodium 100 MG capsule Commonly known as: COLACE Take 1 capsule (100 mg total) by mouth daily.   DULoxetine 20 MG capsule Commonly known as: CYMBALTA Take 1 capsule (20 mg total) by mouth daily.   Eliquis 2.5 MG Tabs tablet Generic drug: apixaban Take 1 tablet (2.5 mg total) by mouth 2 (two) times daily.   FT Antibiotic ointment Generic drug: bacitracin Apply topically 2 (two) times daily.   gabapentin 300 MG capsule Commonly known as: NEURONTIN Take 1 capsule (300 mg total) by mouth  at bedtime. What changed:  medication strength how much to take when to take this   hydrocerin Crea Apply 1 Application topically daily.   lidocaine 5 % Commonly known as: LIDODERM Place 3 patches onto the skin daily. Remove & Discard patch within 12 hours or as directed by MD Notes to patient: For pain--has to be off for at least 12 hours or longer   melatonin 3 MG Tabs tablet Take 1 tablet (3 mg total) by mouth at bedtime. Notes to patient: For sleep   methocarbamol 500 MG tablet Commonly known as: ROBAXIN Take 1 tablet (500 mg total) by mouth every 6 (six) hours as needed for muscle spasms.   midodrine 5 MG tablet Commonly known as: PROAMATINE Take 3 tablets (15 mg total) by mouth 3 (three) times daily with meals.   multivitamin with minerals tablet Take 1 tablet by mouth daily.   polyethylene glycol powder 17 GM/SCOOP powder Commonly known as: GLYCOLAX/MIRALAX Take 1 capful (17 g) with water by mouth 2 (two) times daily. Replaces: polyethylene glycol 17 g packet   Senexon-S 8.6-50 MG tablet Generic drug: senna-docusate Take 1 tablet by mouth 2 (two) times daily.   traMADol 50 MG tablet--Rx # 28 pills. Commonly known as: ULTRAM Take 1 tablet (50 mg total) by mouth 4 (four) times daily as needed for severe pain.   Velphoro 500 MG chewable tablet Generic drug: sucroferric oxyhydroxide Chew 1 tablet (500 mg total) by mouth 3 (three) times daily with meals.   zolpidem 5 MG tablet Commonly known as: AMBIEN Take 1 tablet (5 mg total) by mouth at bedtime as needed for sleep (Insomnia).        Follow-up Information     [name], NP Follow up.   Specialty: Internal Medicine Why: Call in 1-2 days for post hospital follow up Contact information: [address] [place] [address] [phone]         Nada Libman, MD Follow up.   Specialties: Vascular Surgery, Cardiology Why: Call in 1-2 days for post hospital follow up Contact  information: [address] [place] [address] [phone]         Horton Chin, MD. Call.   Specialty: Physical Medicine and Rehabilitation Why: As needed--follow up with PMR in area for continued therapy Contact information: [address] [place] [address] [phone]         [facility] Go on 12/15/2022.   Why: Schedule is Tuesday, Thursday, Saturday. Arrive at 11:30 am  for 11:45 am chair time. Contact information: [address] [place] [address] [phone]                 Signed: Jacquelynn Cree 12/14/2022, 12:13 PM

## 2022-12-12 NOTE — Progress Notes (Signed)
Physical Therapy Session Note  Patient Details  Name: Corey [name] MRN: [id number] Date of Birth: 15-Nov-1970  Today's Date: 12/12/2022 PT Individual Time: 3664-4034 PT Individual Time Calculation (min): 41 min   Short Term Goals: Week 1:  PT Short Term Goal 1 (Week 1): pt will perform bed mobility with supervision PT Short Term Goal 1 - Progress (Week 1): Met PT Short Term Goal 2 (Week 1): pt will transfer bed<>chair with LRAD and CGA PT Short Term Goal 2 - Progress (Week 1): Met PT Short Term Goal 3 (Week 1): pt will transfer sit<>stand with LRAD and min A PT Short Term Goal 3 - Progress (Week 1): Not met Week 2:  PT Short Term Goal 1 (Week 2): STG=LTG due to LOS  Skilled Therapeutic Interventions/Progress Updates:   Received pt sitting in WC with black shrinker on! Pt agreeable to PT treatment but reported feeling "weak" and reported pain 8/10 in L residual limb (premedicated). Pt on 2L O2 via [place] with SPO2 100% at rest. Session with emphasis on functional mobility and generalized strengthening and endurance. Placed lunch order and pt transported to/from room in [facility] dependently for time management purposes. Pt requested to work on UE strength and performed seated "rowing" with 1lb dowel for 30 seconds, progressing to 2lb dowel for 30 seconds, then 2lb dowel for 1 minute with emphasis on UE strength. Pt then performed overhead chest press<>horizontal chest press at 90 degrees with 2.75lb dowel 2x10 bilaterally, WC push ups 2x5, and shoulder extensions with tidal tank 32 seconds x 1 and 49 seconds x 1 to exhaustion.   Discussed car transfer again - pt reporting not having talked to cousin [name] about which car he will bring to pick pt up in. Reinforced that [name] will need to bring one of his lower sitting cars, not his truck, to use slideboard and pt verbalized understanding. Returned to room and concluded session with pt sitting in WC, needs within reach, and seatbelt alarm on.   Therapy  Documentation Precautions:  Precautions Precautions: Fall, Other (comment) Precaution Comments: watch BP; blind in L eye Restrictions Weight Bearing Restrictions: Yes LLE Weight Bearing: Non weight bearing  Therapy/Group: Individual Therapy [name] [name] PT, DPT 12/12/2022, 6:50 AM

## 2022-12-12 NOTE — Progress Notes (Signed)
Patient ID: Corey [name], male   DOB: May 18, 1970, 52 y.o.   MRN: [id number]  Hospital bed and Home 02 ordered through [employer].

## 2022-12-12 NOTE — Progress Notes (Addendum)
Patient ID: Corey [name], male   DOB: 03/01/1971, 52 y.o.   MRN: [id number]  SW met with patient to discuss questions or concerns. Sw informed patient he will resume his regular TTS HD schedule at d/c. Patient will resume on 9/12.   SW informed patient of recommendation of a HB and home 02 set up. No additional questions or concerns.

## 2022-12-12 NOTE — Progress Notes (Signed)
Physical Therapy Session Note  Patient Details  Name: Corey [name] MRN: [id number] Date of Birth: November 26, 1970   Today's Date: 12/12/2022 PT Individual Time: 1300-1409 PT Individual Time Calculation (min): 69 min   Short Term Goals: Week 2:  PT Short Term Goal 1 (Week 2): STG=LTG due to LOS  Skilled Therapeutic Interventions/Progress Updates:       Pt sitting in w/c to start - on wall O2 at 2L. Pt denies pain but reports high levels of fatigue. Has flat affect during treatment session and played patient selected music to help boost mood.   Pt propelled himself mod I at wheelchair level from his room to day room rehab gym. SB transfer achieved with CGA, assist for board placement only - pt appears to have good understanding of setup and w/c management.   Assisted supine on flat mat table without assist. Worked on BLE stretching as patient lacks ~15 deg hip extension on L and lacks ~20 deg knee extension on R. Added 14# weight to distal limb of L AKA to promote prolonged stretch. Improved tolerance and ability to reach ~5deg lacking on hip extension on L.   On R sidelying, continued to work on L hip extension, passively and actively. Also added L hip abd/add 2x10. Returned to supine position and incorporated glut sets and single leg bridges on R (unable to clear buttock from table). 2x15 SLR on L, 2x15 hip abd/add on L.   MinA for supine to sitting for trunk support. SB transfer with CGA (assist for board placement) back into wheelchair. Propel's himself mod I at w/c level back to his room, ~140ft and concluded session connected to wall O2. All needs met.       Therapy Documentation Precautions:  Precautions Precautions: Fall, Other (comment) Precaution Comments: watch BP; blind in L eye Restrictions Weight Bearing Restrictions: Yes LLE Weight Bearing: Non weight bearing General:       Therapy/Group: Individual Therapy  Jmichael Gille P Kavina Cantave 12/12/2022, 11:54 AM

## 2022-12-12 NOTE — Progress Notes (Signed)
[facility] Progress Note   Subjective:    No c/o this AM.  Sitting at EOB eating breakfast Says too much UF on 12/10/22.  Has 2L UF  Objective Vitals:   12/11/22 1913 12/11/22 1929 12/12/22 0347 12/12/22 0500  BP: 106/68 108/73 109/70   Pulse: 62 60 63   Resp: 19 17 18    Temp: 98.9 F (37.2 C) 98.9 F (37.2 C) 98.1 F (36.7 C)   TempSrc: Oral Oral Oral   SpO2: 100% 96% 100%   Weight:    57 kg  Height:       Physical Exam General: Awake, alert,  NAD, on RA Heart: RRR, no murmurs, rubs or gallops Lungs: clear anteriorly, respirations unlabored Abdomen: Soft, non-distended, +BS Extremities: No edema L stump or RLE Dialysis Access: R internal jugular [facility]   Filed Weights   12/10/22 1013 12/10/22 1414 12/12/22 0500  Weight: 54 kg 52 kg 57 kg    Intake/Output Summary (Last 24 hours) at 12/12/2022 1019 Last data filed at 12/12/2022 0900 Gross per 24 hour  Intake 740.46 ml  Output --  Net 740.46 ml    Additional Objective Labs: Basic Metabolic Panel: Recent Labs  Lab 12/06/22 1430 12/08/22 1208 12/10/22 0654  NA 141 135 136  K 5.3* 4.5 4.8  CL 98 97* 98  CO2 24 23 25   GLUCOSE 93 87 89  BUN 70* 53* 42*  CREATININE 7.55* 6.31* 6.01*  CALCIUM 9.6 9.4 9.2  PHOS 4.0 3.5 3.0   Liver Function Tests: Recent Labs  Lab 12/06/22 1430 12/08/22 1208 12/10/22 0654  ALBUMIN 2.9* 3.1* 2.9*   No results for input(s): "LIPASE", "AMYLASE" in the last 168 hours. CBC: Recent Labs  Lab 12/06/22 1430 12/08/22 1208 12/10/22 0654  WBC 10.2 8.2 7.6  NEUTROABS  --   --  5.9  HGB 8.4* 9.1* 8.7*  HCT 30.1* 31.9* 31.5*  MCV 97.4 98.2 98.1  PLT 216 203 213   Blood Culture    Component Value Date/Time   SDES BLOOD LEFT ARM 11/03/2022 0855   SPECREQUEST  11/03/2022 0855    BOTTLES DRAWN AEROBIC AND ANAEROBIC Blood Culture adequate volume   CULT  11/03/2022 0855    NO GROWTH 5 DAYS Performed at [facility], [address] [place], [place]  [address]    REPTSTATUS 11/08/2022 FINAL 11/03/2022 0855    Cardiac Enzymes: No results for input(s): "CKTOTAL", "CKMB", "CKMBINDEX", "TROPONINI" in the last 168 hours. CBG: No results for input(s): "GLUCAP" in the last 168 hours. Iron Studies: No results for input(s): "IRON", "TIBC", "TRANSFERRIN", "FERRITIN" in the last 72 hours. Lab Results  Component Value Date   INR 2.7 (H) 11/19/2022   INR 3.2 (H) 11/18/2022   INR 1.4 (H) 11/15/2022   Studies/Results: DG CHEST PORT 1 VIEW  Result Date: 12/11/2022 CLINICAL DATA:  Fatigue and shortness of breath. EXAM: PORTABLE CHEST 1 VIEW COMPARISON:  Chest radiograph dated 12/05/2022. FINDINGS: Dialysis catheter in similar position. There is cardiomegaly with vascular congestion and possible edema. Overall no significant interval change in the appearance of the lungs since the prior radiograph. No pneumothorax. No acute osseous pathology. IMPRESSION: No significant interval change since the prior radiograph. Electronically Signed   By: Elgie Collard M.D.   On: 12/11/2022 20:29    Medications:  sodium chloride Stopped (12/11/22 1758)   anticoagulant sodium citrate      acetaminophen  650 mg Oral TID WC & HS   amiodarone  200 mg  Oral Daily   apixaban  2.5 mg Oral BID   bacitracin   Topical BID   Chlorhexidine Gluconate Cloth  6 each Topical Q12H   cinacalcet  30 mg Oral Q supper   darbepoetin (ARANESP) injection - DIALYSIS  100 mcg Subcutaneous Q Sat-1800   docusate sodium  100 mg Oral Daily   DULoxetine  20 mg Oral Daily   feeding supplement  237 mL Oral BID BM   gabapentin  300 mg Oral QHS   hydrocerin   Topical Daily   lidocaine  3 patch Transdermal Q24H   lidocaine   Topical TID   melatonin  3 mg Oral QHS   midodrine  15 mg Oral TID WC   multivitamin  1 tablet Oral QHS   polyethylene glycol  17 g Oral BID   senna-docusate  1 tablet Oral BID   sucroferric oxyhydroxide  500 mg Oral TID WC    Dialysis Orders: TTS [facility] 4h  2/2 bath   66kg   TDC   no heparin (allergy)  lock TDC w/ citrate - No ESA or VDRA - Binder: Renvela pwdr 4.8g TID - Sensipar 180mg  every day at home  Assessment/Plan: # Acute hypoxic respiratory failure: In setting of missed HD, pna w/ effusion. CXR with pleural effusion s/p chest tube 7/31 now removed. Continue UF with HD as tolerated with albumin for BP support   # PNA/ L parapneumonic pleural effusion: s/p thoracentesis with cultures. S/p IV abx course and finished with augmentin   #Left leg wound - s/p AKA 8/15   # ESRD: continue HD per TTS schedule.  UF as able with support of albumin.  Hypotension limits UF.  - note that he will need a lower EDW given the amputation.  For now would consider ~50 kg as EDW  - fluid restrictions are in place    #Heparin allergy - use citrate for cath lock.   #Hypotension/ volume:  He is on midodrine. UF as tolerated with HD    #Anemia of ESRD: Hgb 8.7. Aranesp raised to 100 mcg every Sat for the 8/31 dose onward.  Anticipate need to further increase   # Secondary HPTH: switched to sevelamer per pharm recs.  At one point cinacalcet was held due to hypocalcemia but then resumed at 90 mg daily (half of his normal dose).  Stopped cinacalcet again on 8/26 due to hypocalcemia, Calcium now slightly high and PTH is 1125. Will restart sensipar 30mg  for now and follow trend. He was having trouble dealing with the texture of the renvela powder. Trying chewable velphoro 1 per meal.    # Hypocalcemia - see above - held sensipar, resolved, restarting at lower dose   #A-fib: On amiodarone and eliquis - per primary team; has IVC filter   # H/o type B aortic dissection s/p repair (2004)    Arita Miss, MD  [facility] 12/12/2022,10:19 AM  LOS: 13 days

## 2022-12-12 NOTE — Progress Notes (Signed)
Occupational Therapy Session Note  Patient Details  Name: Corey [name] MRN: [id number] Date of Birth: 1971-03-27  Today's Date: 12/12/2022 OT Individual Time: 0805-0900 OT Individual Time Calculation (min): 55 min    Short Term Goals: Week 2:  OT Short Term Goal 1 (Week 2): LTG= STGs  Skilled Therapeutic Interventions/Progress Updates:  Skilled OT intervention completed with focus on self-care, DC planning, DME education and BUE strengthening. Pt received seated in w/c with nursing administering meds, pt reporting his breakfast was delivered cold and was initially apprehensive about therapy prior to eating, but agreeable to session in room until breakfast arrived. 8/10 pain reported in Lt residual limb; medicated at start of session. OT offered rest breaks and repositioning throughout for pain reduction.  Pt received on 3.5L supplemental O2 via [place] with 99% sat. Lowered to 2L to trial weaning, with continued 99% sat, therefore tried 1L however after several mins, pt unable to maintain sat > 96% (per order). Maintained on 2L during and at conclusion of session.  Pt self-propelled in w/c with mod I to sink. Set up A for oral care and facial hygiene, with cues needed to locate items on sink for low vision strategy in unfamiliar environment. Pt declined body bathing or changing clothes.  Breakfast delivered; pt agreeable to DC planning and education during meal. Discussed how plans changed to DC to prior residence with his brother then to his daughters home once insurance coverage processes. Team updated. Discussed DME needs. Pt has hospital bed at home that he received several years ago, but lots of features don't work including the elevating [facility] which pt needs from a breathing/O2 sat standpoint as pt desats when supine and is uncomfortable breathing wise. Advised that pt might qualify for new hospital bed in working condition and have it delivered to his daughter's home who has a standard bed only in  prep for long term plan for DC. Discussed BSC options including drop arm feature for slide board transfers. Education provided about [facility] use at night at bed side or to accommodate toilet transfers if unable to access toilet in his daughters home at w/c level. Pt agreeable. CSW notified. Handouts issued for [facility] liners as a way to self-dispose of waste.  Pt completed the following exercises to promote strength needed for BADLs: (With yellow theraband) -shoulder horizontal abduction x15 -elbow flexion x15 each arm  Pt remained seated in w/c, with belt alarm on/activated, and with all needs in reach at end of session.   Therapy Documentation Precautions:  Precautions Precautions: Fall, Other (comment) Precaution Comments: watch BP; blind in L eye Restrictions Weight Bearing Restrictions: Yes LLE Weight Bearing: Non weight bearing    Therapy/Group: Individual Therapy  Melvyn Novas, MS, OTR/L  12/12/2022, 12:12 PM

## 2022-12-13 DIAGNOSIS — M1990 Unspecified osteoarthritis, unspecified site: Secondary | ICD-10-CM

## 2022-12-13 DIAGNOSIS — G6289 Other specified polyneuropathies: Secondary | ICD-10-CM

## 2022-12-13 MED ORDER — BACITRACIN ZINC 500 UNIT/GM EX OINT
TOPICAL_OINTMENT | Freq: Two times a day (BID) | CUTANEOUS | 0 refills | Status: DC
  Filled 2022-12-13: qty 28.4, 14d supply, fill #0

## 2022-12-13 MED ORDER — DOCUSATE SODIUM 100 MG PO CAPS
100.0000 mg | ORAL_CAPSULE | Freq: Every day | ORAL | 0 refills | Status: DC
  Filled 2022-12-13: qty 100, 100d supply, fill #0

## 2022-12-13 MED ORDER — POLYETHYLENE GLYCOL 3350 17 GM/SCOOP PO POWD
17.0000 g | Freq: Two times a day (BID) | ORAL | 0 refills | Status: DC
  Filled 2022-12-13: qty 238, 7d supply, fill #0

## 2022-12-13 MED ORDER — CINACALCET HCL 30 MG PO TABS
30.0000 mg | ORAL_TABLET | Freq: Every day | ORAL | 0 refills | Status: DC
Start: 2022-12-13 — End: 2023-10-05
  Filled 2022-12-13: qty 30, 30d supply, fill #0

## 2022-12-13 MED ORDER — SENNOSIDES-DOCUSATE SODIUM 8.6-50 MG PO TABS
1.0000 | ORAL_TABLET | Freq: Two times a day (BID) | ORAL | 0 refills | Status: DC
  Filled 2022-12-13: qty 30, 15d supply, fill #0

## 2022-12-13 MED ORDER — GABAPENTIN 300 MG PO CAPS
300.0000 mg | ORAL_CAPSULE | Freq: Every day | ORAL | 0 refills | Status: DC
  Filled 2022-12-13: qty 30, 30d supply, fill #0

## 2022-12-13 MED ORDER — APIXABAN 2.5 MG PO TABS
2.5000 mg | ORAL_TABLET | Freq: Two times a day (BID) | ORAL | 0 refills | Status: DC
  Filled 2022-12-13: qty 60, 30d supply, fill #0

## 2022-12-13 MED ORDER — SUCROFERRIC OXYHYDROXIDE 500 MG PO CHEW
500.0000 mg | CHEWABLE_TABLET | Freq: Three times a day (TID) | ORAL | 0 refills | Status: DC
Start: 2022-12-13 — End: 2023-09-30
  Filled 2022-12-13: qty 36, 12d supply, fill #0

## 2022-12-13 MED ORDER — DICLOFENAC SODIUM 1 % EX GEL
2.0000 g | Freq: Four times a day (QID) | CUTANEOUS | Status: DC | PRN
  Administered 2022-12-13: 2 g via TOPICAL
  Filled 2022-12-13: qty 100

## 2022-12-13 MED ORDER — DICLOFENAC SODIUM 1 % EX GEL
2.0000 g | Freq: Four times a day (QID) | CUTANEOUS | 0 refills | Status: DC | PRN
  Filled 2022-12-13: qty 100, 13d supply, fill #0

## 2022-12-13 MED ORDER — MELATONIN 3 MG PO TABS
3.0000 mg | ORAL_TABLET | Freq: Every day | ORAL | 0 refills | Status: DC
  Filled 2022-12-13: qty 60, 60d supply, fill #0

## 2022-12-13 MED ORDER — ZOLPIDEM TARTRATE 5 MG PO TABS
5.0000 mg | ORAL_TABLET | Freq: Every evening | ORAL | 0 refills | Status: DC | PRN
  Filled 2022-12-13: qty 30, 30d supply, fill #0

## 2022-12-13 MED ORDER — MIDODRINE HCL 5 MG PO TABS
15.0000 mg | ORAL_TABLET | Freq: Three times a day (TID) | ORAL | 0 refills | Status: DC
Start: 2022-12-13 — End: 2023-01-20
  Filled 2022-12-13: qty 270, 30d supply, fill #0

## 2022-12-13 MED ORDER — DULOXETINE HCL 20 MG PO CPEP
20.0000 mg | ORAL_CAPSULE | Freq: Every day | ORAL | 0 refills | Status: DC
  Filled 2022-12-13: qty 30, 30d supply, fill #0

## 2022-12-13 MED ORDER — LIDOCAINE 5 % EX PTCH
3.0000 | MEDICATED_PATCH | CUTANEOUS | 0 refills | Status: DC
Start: 2022-12-13 — End: 2023-10-05
  Filled 2022-12-13: qty 90, 30d supply, fill #0

## 2022-12-13 MED ORDER — METHOCARBAMOL 500 MG PO TABS
500.0000 mg | ORAL_TABLET | Freq: Four times a day (QID) | ORAL | 0 refills | Status: DC | PRN
  Filled 2022-12-13: qty 40, 10d supply, fill #0

## 2022-12-13 MED ORDER — TRAMADOL HCL 50 MG PO TABS
50.0000 mg | ORAL_TABLET | Freq: Four times a day (QID) | ORAL | 0 refills | Status: DC | PRN
  Filled 2022-12-13: qty 28, 5d supply, fill #0

## 2022-12-13 NOTE — Progress Notes (Signed)
Patient ID: Corey [name], male   DOB: Aug 03, 1970, 52 y.o.   MRN: [id number]  Sw met with patient to discuss d/c questions and concerns. Patient informed SW that his cousin will be picking him up for d/c tomorrow. HB and 02 concentrator ordered through [employer] and arranged to be delivered today. SW confirmed d/c address of [address] with patient. Portable concentrator and LR will be delivered to pt's room today. No additional questions or concerns.

## 2022-12-13 NOTE — Plan of Care (Signed)
  Problem: RH Balance Goal: LTG: Patient will maintain dynamic sitting balance (OT) Description: LTG:  Patient will maintain dynamic sitting balance with assistance during activities of daily living (OT) Outcome: Completed/Met   Problem: RH Bathing Goal: LTG Patient will bathe all body parts with assist levels (OT) Description: LTG: Patient will bathe all body parts with assist levels (OT) Outcome: Completed/Met   Problem: RH Dressing Goal: LTG Patient will perform upper body dressing (OT) Description: LTG Patient will perform upper body dressing with assist, with/without cues (OT). Outcome: Completed/Met Goal: LTG Patient will perform lower body dressing w/assist (OT) Description: LTG: Patient will perform lower body dressing with assist, with/without cues in positioning using equipment (OT) Outcome: Completed/Met   Problem: RH Toileting Goal: LTG Patient will perform toileting task (3/3 steps) with assistance level (OT) Description: LTG: Patient will perform toileting task (3/3 steps) with assistance level (OT)  Outcome: Completed/Met   Problem: RH Toilet Transfers Goal: LTG Patient will perform toilet transfers w/assist (OT) Description: LTG: Patient will perform toilet transfers with assist, with/without cues using equipment (OT) Outcome: Completed/Met

## 2022-12-13 NOTE — Progress Notes (Signed)
Unable to obtain bed scale not operate but had wt early this am 0 UF as ordered

## 2022-12-13 NOTE — Progress Notes (Signed)
Nutrition Follow-up  DOCUMENTATION CODES:   Underweight  INTERVENTION:  - Continue Regular diet, 1200 mL.   - Continue Ensure Enlive po BID, each supplement provides 350 kcal and 20 grams of protein.  NUTRITION DIAGNOSIS:   Increased nutrient needs related to chronic illness as evidenced by estimated needs.  GOAL:   Patient will meet greater than or equal to 90% of their needs - Met with PO intakes.   MONITOR:   PO intake, Supplement acceptance  REASON FOR ASSESSMENT:   Malnutrition Screening Tool    ASSESSMENT:   52 y.o. male admits to CIR related to functional deficits secondary to L AKA. PMH includes: ESRD on HD, neuropathy, PAF, paralysis.  Meds reviewed: colace, MVI, miralax, senokot. Labs reviewed: BUN/Creatinine elevated.   Pt ate ate 100% of his breakfast this am. Per record, has mostly been eating 60-100% of his meals with the exception of 2 meals at 25%. The pt is likely meeting his needs at this time. RD will continue to monitor PO intakes.   Diet Order:   Diet Order             Diet regular Room service appropriate? Yes; Fluid consistency: Thin; Fluid restriction: 1200 mL Fluid  Diet effective now                   EDUCATION NEEDS:   Not appropriate for education at this time  Skin:  Skin Assessment: Skin Integrity Issues: Skin Integrity Issues:: Incisions Incisions: L chest; L leg  Last BM:  9/10 - type 6  Height:   Ht Readings from Last 1 Encounters:  12/07/22 6\' 3"  (1.905 m)    Weight:   Wt Readings from Last 1 Encounters:  12/13/22 59 kg    Ideal Body Weight:     BMI:  Body mass index is 16.26 kg/m.  Estimated Nutritional Needs:   Kcal:  2000-2200 kcals  Protein:  95-115 gm  Fluid:  1000 mL +UOP  Bethann Humble, RD, LDN, CNSC.

## 2022-12-13 NOTE — Progress Notes (Signed)
Occupational Therapy Discharge Summary  Patient Details  Name: Corey [name] MRN: [id number] Date of Birth: 12/28/1970  Date of Discharge from OT service:December 13, 2022   Patient has met 6 of 6 long term goals due to improved activity tolerance, improved balance, ability to compensate for deficits, functional use of  RIGHT upper and LEFT upper extremity, and improved coordination.  Patient to discharge at overall Supervision level.  Patient's care partner is available to provide the necessary physical assistance at discharge. Pt to discharge home with brother, who is disabled and only able to provide supervision. Pt plans on moving to [place] at end of month.   All goals met  Recommendation:  Patient will benefit from ongoing skilled OT services in home health setting to continue to advance functional skills in the area of BADL and Reduce care partner burden.  Equipment: Drop arm BSC  Reasons for discharge: treatment goals met and discharge from hospital  Patient/family agrees with progress made and goals achieved: Yes  OT Discharge Precautions/Restrictions  Precautions Precautions: Fall;Other (comment) Precaution Comments: blind in L eye, monitor O2 Restrictions Weight Bearing Restrictions: Yes LLE Weight Bearing: Non weight bearing ADL ADL Eating: Modified independent Where Assessed-Eating: Wheelchair Grooming: Modified independent Where Assessed-Grooming: Sitting at sink Upper Body Bathing: Setup Where Assessed-Upper Body Bathing: Sitting at sink Lower Body Bathing: Supervision/safety Where Assessed-Lower Body Bathing: Sitting at sink Upper Body Dressing: Setup Where Assessed-Upper Body Dressing: Wheelchair Lower Body Dressing: Setup Where Assessed-Lower Body Dressing: Bed level Toileting: Supervision Where Assessed-Toileting: Bedside Commode Toilet Transfer: Close supervision Toilet Transfer Method: Scientist, research (life sciences): Drop arm bedside  commode Tub/Shower Transfer: Other (comment) (Na due to pt sponge bathing only at baseline 2/2 HD port) Tub/Shower Transfer Method: Unable to assess Film/video editor: Unable to assess Film/video editor Method: Unable to assess Vision Baseline Vision/History: 2 Legally blind;6 Macular Degeneration;1 Wears glasses Patient Visual Report: No change from baseline Vision Assessment?: Vision impaired- to be further tested in functional context Additional Comments: legally blind at baseline Perception  Perception: Impaired Praxis Praxis: WFL Cognition Cognition Overall Cognitive Status: Within Functional Limits for tasks assessed Arousal/Alertness: Awake/alert Memory: Appears intact Awareness: Appears intact Problem Solving: Appears intact Safety/Judgment: Appears intact Sensation Sensation Light Touch: Impaired Detail Light Touch Impaired Details: Absent RLE;Impaired LLE Hot/Cold: Not tested Stereognosis: Not tested Additional Comments: absent sensation from L3 dermatome and below on RLE and absent sensation along proximal aspect of L residual limb and "tingling" along incision. Pt reported numbness/tingling/burning along entire RLE and phantom pain in L residual limb. Coordination Gross Motor Movements are Fluid and Coordinated: No Fine Motor Movements are Fluid and Coordinated: No Coordination and Movement Description: uncoordinated due to altered balance strategies from L AKA, pain, and blindness Finger Nose Finger Test: impaired bilaterally due to blindness Heel Shin Test: unable to perform on LLE due to AKA Motor  Motor Motor: Abnormal postural alignment and control Motor - Skilled Clinical Observations: altered balance strategies due to L AKA, pain, and blindness in L eye Mobility  Bed Mobility Bed Mobility: Rolling Right;Rolling Left;Sit to Supine Rolling Right: Independent with assistive device Rolling Left: Independent with assistive device Supine to Sit:  Independent with assistive device Sit to Supine: Independent with assistive device  Trunk/Postural Assessment  Cervical Assessment Cervical Assessment: Exceptions to [facility] (cervical flexion) Thoracic Assessment Thoracic Assessment: Exceptions to [facility] (kyphosis) Lumbar Assessment Lumbar Assessment: Exceptions to [facility] (posterior pelvic tilt in sitting) Postural Control Postural Control: Deficits on evaluation Righting Reactions: delayed  and inadequate on L side Protective Responses: delayed and inadequate on L side  Balance Balance Balance Assessed: Yes Static Sitting Balance Static Sitting - Balance Support: Feet supported;Bilateral upper extremity supported Static Sitting - Level of Assistance: 7: Independent Dynamic Sitting Balance Dynamic Sitting - Balance Support: Feet supported;No upper extremity supported Dynamic Sitting - Level of Assistance: 6: Modified independent (Device/Increase time) Extremity/Trunk Assessment RUE Assessment RUE Assessment: Within Functional Limits LUE Assessment LUE Assessment: Within Functional Limits   Stella Encarnacion E Darothy Courtright, MS, OTR/L  12/13/2022, 12:20 PM

## 2022-12-13 NOTE — Plan of Care (Signed)
  Problem: RH Balance Goal: LTG Patient will maintain dynamic standing balance (PT) Description: LTG:  Patient will maintain dynamic standing balance with assistance during mobility activities (PT) Outcome: Not Applicable Flowsheets (Taken 12/13/2022 0712) LTG: Pt will maintain dynamic standing balance during mobility activities with:: (D/C) -- Note: D/C   Problem: Sit to Stand Goal: LTG:  Patient will perform sit to stand with assistance level (PT) Description: LTG:  Patient will perform sit to stand with assistance level (PT) Outcome: Not Applicable Flowsheets (Taken 12/13/2022 0712) LTG: PT will perform sit to stand in preparation for functional mobility with assistance level: (D/C) -- Note: D/C

## 2022-12-13 NOTE — Progress Notes (Signed)
PROGRESS NOTE   Subjective/Complaints: C/o neuropathy and arthritis in hands, also has neuropathy in feet, discussed voltaren gel ordered prn for arthritis Discussed Qutenza for neuropathy   ROS: +Phantom limb pain- continues, nosebleeds, +peripheral neuropathy, +malaise- feels too much fluid was removed with dialysis  Denies fevers, chills, N/V, abdominal pain, constipation, diarrhea, SOB, cough, chest pain, new weakness or paraesthesias.   Review of Systems  Constitutional:  Positive for malaise/fatigue. Negative for chills and fever.  HENT:  Negative for congestion.   Respiratory:  Negative for shortness of breath.   Cardiovascular:  Negative for chest pain.  Gastrointestinal:  Negative for abdominal pain, nausea and vomiting.  Musculoskeletal:  Positive for joint pain.  Neurological:  Positive for weakness.     Objective:   DG CHEST PORT 1 VIEW  Result Date: 12/11/2022 CLINICAL DATA:  Fatigue and shortness of breath. EXAM: PORTABLE CHEST 1 VIEW COMPARISON:  Chest radiograph dated 12/05/2022. FINDINGS: Dialysis catheter in similar position. There is cardiomegaly with vascular congestion and possible edema. Overall no significant interval change in the appearance of the lungs since the prior radiograph. No pneumothorax. No acute osseous pathology. IMPRESSION: No significant interval change since the prior radiograph. Electronically Signed   By: Elgie Collard M.D.   On: 12/11/2022 20:29   No results for input(s): "WBC", "HGB", "HCT", "PLT" in the last 72 hours.  No results for input(s): "NA", "K", "CL", "CO2", "GLUCOSE", "BUN", "CREATININE", "CALCIUM" in the last 72 hours.   Intake/Output Summary (Last 24 hours) at 12/13/2022 1023 Last data filed at 12/13/2022 0755 Gross per 24 hour  Intake 360 ml  Output --  Net 360 ml        Physical Exam: Vital Signs Blood pressure 97/63, pulse 60, temperature 97.8 F (36.6 C),  temperature source Oral, resp. rate 16, height 6\' 3"  (1.905 m), weight 59 kg, SpO2 100%. Gen: no distress, lying in bed, BMI 16.26 Eyes: Pupils equal and round.  Looks in general direction.  Says can see shapes, but nothing clear  Cardiovascular:    RRR Pulmonary: CTAB, nonlabored Abdominal:     General: Abdomen is flat. There is no distension.     Palpations: Abdomen is soft.     Tenderness: There is no abdominal tenderness.  Musculoskeletal:        General: No swelling or tenderness.     Cervical back: Neck supple. No tenderness.     Comments: Amputation site well wrapped in figure-of-eight for Ace bandage, loose and dog-eared; rewrapped  Right foot with overgrown toe nails.   Ues 5-/5 B/L RLE- HF 4-/5; otherwise DF 0/5 and PF and KE 2-/5 LLE- L AKA- with HF 4-/5 Skin:     General: Skin is warm and dry.     Comments: Boggy area infra clavicular area--non tender and reported to be stable. Multiple grafts RUE--non functional. R-internal jugular noted. RLE with areas of discoloration and stasis changes.  1+ edema right residual limb. R HD catheter looks OK   Leg/ L AKA- tip elevated on pillow-dressing with some dried blood, staples appear well-approximated and intact.  No apparent drainage or edema.  Healing well.  Neurological:     Mental  Status: Sleeping initially but wakes to voice.  He is alert and oriented to person, place, and time.     Comments: Decreased to absent below R knee, Decreased sensation in hands  Psychiatric:     Cooperative and appropriate  Assessment/Plan: 1. Functional deficits which require 3+ hours per day of interdisciplinary therapy in a comprehensive inpatient rehab setting. Physiatrist is providing close team supervision and 24 hour management of active medical problems listed below. Physiatrist and rehab team continue to assess barriers to discharge/monitor patient progress toward functional and medical goals  Care Tool:  Bathing    Body parts  bathed by patient: Right arm, Left arm, Chest, Abdomen, Face (UB bathing only)     Body parts n/a: Front perineal area, Buttocks, Right upper leg, Left upper leg, Right lower leg, Left lower leg   Bathing assist Assist Level: Supervision/Verbal cueing     Upper Body Dressing/Undressing Upper body dressing   What is the patient wearing?: Pull over shirt    Upper body assist Assist Level: Set up assist    Lower Body Dressing/Undressing Lower body dressing      What is the patient wearing?: Ace wrap/stump shrinker     Lower body assist Assist for lower body dressing: Minimal Assistance - Patient > 75%     Toileting Toileting    Toileting assist Assist for toileting: Moderate Assistance - Patient 50 - 74%     Transfers Chair/bed transfer  Transfers assist  Chair/bed transfer activity did not occur: Safety/medical concerns  Chair/bed transfer assist level: Independent with assistive device Chair/bed transfer assistive device: Other (slideboard)   Locomotion Ambulation   Ambulation assist   Ambulation activity did not occur: Safety/medical concerns (pain, poor condition of RLE)          Walk 10 feet activity   Assist  Walk 10 feet activity did not occur: Safety/medical concerns (pain, poor condition of RLE)        Walk 50 feet activity   Assist Walk 50 feet with 2 turns activity did not occur: Safety/medical concerns (pain, poor condition of RLE)         Walk 150 feet activity   Assist Walk 150 feet activity did not occur: Safety/medical concerns (pain, poor condition of RLE)         Walk 10 feet on uneven surface  activity   Assist Walk 10 feet on uneven surfaces activity did not occur: Safety/medical concerns (pain, poor condition of RLE)         Wheelchair     Assist Is the patient using a wheelchair?: Yes Type of Wheelchair: Manual Wheelchair activity did not occur: Safety/medical concerns (pain)  Wheelchair assist level:  Independent Max wheelchair distance: 178ft    Wheelchair 50 feet with 2 turns activity    Assist    Wheelchair 50 feet with 2 turns activity did not occur: Safety/medical concerns (pain)   Assist Level: Independent   Wheelchair 150 feet activity     Assist  Wheelchair 150 feet activity did not occur: Safety/medical concerns (pain)   Assist Level: Independent   Blood pressure 97/63, pulse 60, temperature 97.8 F (36.6 C), temperature source Oral, resp. rate 16, height 6\' 3"  (1.905 m), weight 59 kg, SpO2 100%.  Medical Problem List and Plan: 1. Functional deficits secondary to  L AKA secondary to left foot wound 2/2 PAD             -patient may not shower due to R HD catheter             -  ELOS/Goals: 15 days supervision, extended to Wednesday  -Continue CIR  Discussed plan for d/c tomorrow  2.  Antithrombotics: -DVT/anticoagulation:  Pharmaceutical: Continue Eliquis. IVC filter in place.              -antiplatelet therapy: N/A  3. Penile pain: topical bacitracin ordered, continue  4. Insomnia complicated by phantom pain: d/c amitriptyline since not helping with his sleep and cannot increase further given cardiac history. Add melatonin 3mg  HS  -Continues to be limited by phantom pain with difficulty sleeping; already getting as needed Ambien, would not further dose increase sleep medications  5. Neuropsych/cognition: This patient is capable of making decisions on his own behalf. 6. Skin/Wound Care: Routine pressure relief measure 7. Fluids/Electrolytes/Nutrition:  Strict I/O. Daily weights. Diet changed to Renal per input from nephrology. 8. L-AKA: Monitor incision for healing. Dry dressing. Shrinkers ordered  -8-31, 9/1: Per patient, not tolerating shrinkers s, but tolerating figure-of-eight ace wrap.  Continue.  9. ESRD: Renal diet with 1200 cc FR/HD TTS at the end of the day to help with tolerance of therapy.   -9/8 nephrology ordered 500 normal saline bolus  today 10. H/o AAA aneurysm rupture s/p repair:  with resultant paraplegia, sensory deficits from saddle down and cauda equina syndrome             --was able to walk till a few years ago when left knee gave out.              --has severe LLQ pain prior to defecation.Continue Senna/miralax--discussed suppository every other day but patient wants to think on it "that's too much"  11. Anemia of chronic disease:] 12. L- PNA and parapneumonic effusion s/p CT: Has completed 2 week course of antibiotics.  13. Fluid overload: Continue to monitor for symptoms and wean oxygen as tolerated.   -Weights up to baseline on 9/2 Filed Weights   12/10/22 1414 12/12/22 0500 12/13/22 0500  Weight: 52 kg 57 kg 59 kg    14. Neurogenic bowel: Will monitor for now.  Last bowel movement 9/2, d/c bisacodyl suppository  -9/8 last BM yesterday, continue to monitor    15. Neuropathic pain- will change gabapentin to 300 mg at bedtime, decrease amitriptyline to 10mg  since not helping with sleep and we cannot increase further due to cardiac history  D/c amitriptyline since it is not helping and given cardiac risk factors  Prescribing Home Zynex NexWave Stimulator Device and supplies as needed. IFC, NMES and TENS medically necessary Treatment Rx: Daily @ 30-40 minutes per treatment PRN. Zynex NexWave only, no substitutions. Treatment Goals: 1) To reduce and/or eliminate pain 2) To improve functional capacity and Activities of daily living 3) To reduce or prevent the need for oral medications 4) To improve circulation in the injured region 5) To decrease or prevent muscle spasm and muscle atrophy 6) To provide a self-management tool to the patient The patient has not sufficiently improved with conservative care. Numerous studies indexed by Medline and PubMed.gov have shown Neuromuscular, Interferential, and TENS stimulators to reduce pain, improve function, and reduce medication use in injured patients. Continued use of this  evidence based, safe, drug free treatment is both reasonable and medically necessary at this time.  -cymbalta 20mg  daily started  -9/7 continue gabapentin 300 mg at bedtime, dosing options limited by ESRD, Cymbalta 20 mg started yesterday may take a few days for full response  -9/8 discussed may take a few days for Cymbalta take full effect  16. Leukocytosis: WBC reviewed  and has normalized  17. Underweight: provide dietary education, monitor weights  18. Suboptimal vitamin D: will not start supplement due to allergy to doxecalciferol. Will order grounds pass so he can have sunlight exposure.   19. Chest pain, chronic: renal is aware, continue ultrafiltration with dialysis  -9/8 called regarding chest discomfort.  Discomfort worsened with deep breathing however no shortness of breath.  EKG/troponins ordered.  Will check chest x-ray  Addendum, pt was noted to  have some chest wall soreness right side, can try lidocaine patch to this area, continue  20.  Overnight epistaxis.  Reordered oxygen to be humidified. Resolved  21. Malaise: discussed that he feels too much fluid was removed with dialysis, oxygen reviewed and is satting well, weekend notes and nephrology notes reviewed, CXR and COVID test ordered to r/o infection, discussed that both are negative for new pathology  22. Bradycardia: continue to monitor HR TID, resolved      12/13/2022    5:22 AM 12/13/2022    5:00 AM 12/12/2022    7:28 PM  Vitals with BMI  Weight  130 lbs 1 oz   BMI  16.26   Systolic 97  92  Diastolic 63  57  Pulse 60  59     23. Bloody clots in stool: anusol ordered, continue, discussed to let us know if happens again, discussed possibly secondary to internal hemorrhoids  -9/7 BM today-not described as bloody  24. Peripheral neuropathy: -Discussed Qutenza as an option for neuropathic pain control. Discussed that this is a capsaicin patch, stronger than capsaicin cream. Discussed that it is currently approved  for diabetic peripheral neuropathy and post-herpetic neuralgia, but that it has also shown benefit in treating other forms of neuropathy. Provided patient with link to site to learn more about the patch: https://www.clark.biz/. Discussed that the patch would be placed in office and benefits usually last 3 months. Discussed that unintended exposure to capsaicin can cause severe irritation of eyes, mucous membranes, respiratory tract, and skin, but that Qutenza is a local treatment and does not have the systemic side effects of other nerve medications. Discussed that there may be pain, itching, erythema, and decreased sensory function associated with the application of Qutenza. Side effects usually subside within 1 week. A cold pack of analgesic medications can help with these side effects. Blood pressure can also be increased due to pain associated with administration of the patch. Messaged clinic staff to schedule him for this procedure  25. Arthritis: prn voltaren gel ordered  >50 minutes spent in discussion of Qutenza for peripheral neuropathy, messaging clinic staff to schedule him for this procedure, discussed that I have ordered voltaren gel for his arthritis, discussed plan for d/c tomorrow, vitals and weight reviewed LOS: 14 days A FACE TO FACE EVALUATION WAS PERFORMED  Clint Bolder P Mohamadou Maciver 12/13/2022, 10:23 AM

## 2022-12-13 NOTE — Progress Notes (Signed)
Inpatient Rehabilitation Care Coordinator Discharge Note   Patient Details  Name: Corey [name] MRN: [id number] Date of Birth: 1970-08-01   Discharge location: Home with brother  Length of Stay: 15 Days  Discharge activity level: Sup/Min  Home/community participation: Brother, cousin and family.  Patient response KX:FGHWEX Literacy - How often do you need to have someone help you when you read instructions, pamphlets, or other written material from your doctor or pharmacy?: Rarely  Patient response HB:ZJIRCV Isolation - How often do you feel lonely or isolated from those around you?: Sometimes  Services provided included: SW, Neuropsych, Pharmacy, TR, RN, CM, SLP, OT, PT, RD, MD  Financial Services:  Financial Services Utilized: Private Insurance [facility] Medicare  Choices offered to/list presented to: patient  Follow-up services arranged:  Home Health, DME Home Health Agency: [facility] OT SLP    DME : :Home 02 Set up, Portable Concentrator, ELR, Hospital Bed, Drop Arm Bedside Commode, Transfer Board    Patient response to transportation need: Is the patient able to respond to transportation needs?: Yes In the past 12 months, has lack of transportation kept you from medical appointments or from getting medications?: No In the past 12 months, has lack of transportation kept you from meetings, work, or from getting things needed for daily living?: No   Patient/Family verbalized understanding of follow-up arrangements:  Yes  Individual responsible for coordination of the follow-up plan: self or brother, [name] [phone]  Confirmed correct DME delivered: Andria Rhein 12/13/2022    Comments (or additional information):  Summary of Stay    Date/Time Discharge Planning CSW  12/13/22 1437 Patient discharging home tomorrow. HH established. HD set. DME in place. CJB  12/06/22 1334 Discharging home with daughter in [place]. HD transfer to [facility]. Patient will  require a PCP, transition of Medicaid to [facility] and reccomended contacting social security to iniciate Medicare. CJB  11/30/22 1049 New evaluation today will need to confirm plan for discharge RGD  11/30/22 0954 TBA. AAC       Andria Rhein

## 2022-12-13 NOTE — Progress Notes (Signed)
Inpatient Rehabilitation Discharge Medication Review by a Pharmacist   A complete drug regimen review was completed for this patient to identify any potential clinically significant medication issues.   High Risk Drug Classes Is patient taking? Indication by Medication  Antipsychotic No   Anticoagulant Yes Apixaban - AF  Antibiotic No    Opioid yes Tramadol PRN- acute pain   Antiplatelet No    Hypoglycemics/insulin No    Vasoactive Medication No Amiodarone - AF Midodrine - BP support  Chemotherapy No    Other Yes Benadryl/hydroxizine - itching Aranesp - anemia Gabapentin - neuropathy Velphoro, cinacalcet- CKD-MBD  MVI - supplement Duloxetine-  limb pain  Ambien PRN- insomnia         Type of Medication Issue Identified Description of Issue Recommendation(s)  Drug Interaction(s) (clinically significant)        Duplicate Therapy       Allergy        No Medication Administration End Date        Incorrect Dose        Additional Drug Therapy Needed        Significant med changes from prior encounter (inform family/care partners about these prior to discharge).  NEW apixaban (educated 8/19), gabapentin, midodrine  STOP warfarin, renvela   Review at discharge  Other            Clinically significant medication issues were identified that warrant physician communication and completion of prescribed/recommended actions by midnight of the next day:  No   Name of provider notified for urgent issues identified:    Provider Method of Notification:        Jani Gravel, PharmD Clinical Pharmacist  12/14/2022 9:49 AM

## 2022-12-13 NOTE — Progress Notes (Signed)
Occupational Therapy Session Note  Patient Details  Name: Corey [name] MRN: [id number] Date of Birth: 22-Feb-1971  Today's Date: 12/13/2022 OT Individual Time: 0102-7253 & 1050-1200 OT Individual Time Calculation (min): 40 min & 70 min   Short Term Goals: Week 2:  OT Short Term Goal 1 (Week 2): LTG= STGs  Skilled Therapeutic Interventions/Progress Updates:  Session 1 Skilled OT intervention completed with focus on ADL retraining and DC planning. Pt received seated in w/c with [place] over ears but not in nose therefore on RA but unaware. Pt agreeable to session. 8/10 pain reported in Lt residual limb; nurse provided meds at start of session. OT offered rest breaks, repositioning throughout for pain reduction.  On RA, pt's O2 sat was at 84%, however with re-donning of [place] with 1.5L pt rebounded to 94% therefore remained on 1.5L during session.  Pt requesting to freshen up at sink. Mod I for oral care and UB bathing/dressing. Pt declined washing LB as "staff already did it." Extensive conversation about limited assist by his brother and how he plans to manage all self-care needs. Poor insight noted with education provided on keeping [facility] and w/c accessible to bed at night for mobility/toileting needs, importance of having supervision for transfers especially toilet transfers and how to manage unforseen situations such as a fall as his brother has physical limitations. CSW notified of pt request for in person visit to discuss home health assistance options and other DC plans.   OT applied medicated lotion per nursing request to RLE only. Discussed signs of poor circulation or infection on the contralateral limb as pt reports   [facility] contacted for smaller AKA shrinkers as current size 3 is noted to be falling off his Lt residual limb due to decreased edema.  Pt remained seated in w/c, with chair alarm on/activated, and with all needs in reach at end of session.  Session 2 Skilled OT intervention  completed with focus on toilet transfer and toileting education, BUE strengthening. Pt received seated in w/c, agreeable to session. 8/10 pain reported in Lt residual limb; pre-medicated. OT offered rest breaks, repositioning throughout for pain reduction.  Pt received with 1.5L supplemental O2 via , with 99% sat. Remained on 1.5L during session. Pt's portable tank was delivered to room, pt with poor awareness about his plan for managing O2 at home independently, therefore clarified with CSW about wall unit with 50 ft cord that will be set up A home. Discussed how he needs to manage the cord and checking his O2 sat frequently.   DABSC and slideboard delivered to room and pt agreeable to practice transfers with low vision strategies to prep for DC. OT utilized verbal and tactile cues for locating all items of the BSC (bucket etc), drop arm feature and slide board placement. Pt completed board placement then transfer on board from w/c <> DABSC x3 with assist fading from mod cues/CGA to close supervision with no cues but did require lots of time to process for low vision strategies.  Seated on BSC, pt completed the following activities to simulate toileting including reaching/wiping with pericare and LB clothing management: -donn/doff looped theraband from upper thighs <> belly button; Supervision/CGA donning due to challenge with balance and weight shifting to clear band under buttocks.  Seated in w/c, pt completed the following BUE exercises to promote strength needed for functional transfers: (With yellow theraband) 12 reps Horizontal abduction Self-anchored shoulder flexion each arm Self-anchored bicep flexion each arm Self-anchored tricep extension each arm Shoulder  external rotation  Pt remained seated in w/c, with nurse present at direct care handoff.   Therapy Documentation Precautions:  Precautions Precautions: Fall, Other (comment) Precaution Comments: blind in L eye, monitor  O2 Restrictions Weight Bearing Restrictions: Yes LLE Weight Bearing: Non weight bearing    Therapy/Group: Individual Therapy  Melvyn Novas, MS, OTR/L  12/13/2022, 12:18 PM

## 2022-12-13 NOTE — Progress Notes (Signed)
Physical Therapy Discharge Summary  Patient Details  Name: Corey [name] MRN: [id number] Date of Birth: [birth year]  Date of Discharge from PT service:December 13, 2022  Today's Date: 12/13/2022 PT Individual Time: 7425-9563 PT Individual Time Calculation (min): 70 min   Patient has met 5 of 5 long term goals due to improved activity tolerance, improved balance, improved postural control, increased strength, increased range of motion, ability to compensate for deficits, improved awareness, and improved coordination. Patient to discharge at a wheelchair level Modified Independent using slideboard. Patient's care partner requires assistance to provide the necessary physical assistance at discharge. Pt to discharge home with brother, who is disabled and only able to provide supervision. Pt plans on moving to [place] at end of month.   All goals met   Recommendation:  Patient will benefit from ongoing skilled PT services in home health setting to continue to advance safe functional mobility, address ongoing impairments in transfers, generalized strengthening and endurance, dynamic standing balance/coordination, limb loss education, and to minimize fall risk.  Equipment: 24in slideboard, L amputee support pad, 18x18 Roho cushion, and hospital bed  Reasons for discharge: treatment goals met and discharge from hospital  Patient/family agrees with progress made and goals achieved: Yes  Today's Interventions: Received pt sitting in WC, pt agreeable to PT treatment, and reported pain 8/10 in L residual Limb (premedicated). Pt on 2L with SPO2 100% at rest. Session with emphasis on discharge planning, functional mobility/transfers, generalized strengthening and endurance, and simulated car transfers.   Went through sensation, MMT, and pain interference questionnaire in preparation for discharge. MD arrived for morning rounds and discussed pt's CLOF as pt upset that he hasn't made more progress here.  Informed pt that he is currently at a mod I level and able to safety transfer, has been issued an extensive HEP, and will continue receiving HHPT upon D/C. Secure chatted CSW regarding L amputee support pad and Roho cushion not delivered. Pt performed WC mobility 163ft using BUE and mod I to fatigue - emphasis on UE strength. Pt transported remainder of way to ortho gym in [facility] dependently. Pt performed simulated car transfer via slideboard with supervision (pt able to place/remove board without assist). Educated pt on donning/doffing legrests - pt able to remove legrests with supervision but required total A to don legrests due to blindness; reports his brother or cousin will be able to assist with this. Pt then transferred on/off Nustep via slideboard mod I and performed seated BUE and RLE strengthening on Nustep at workload 4 for 3 minutes and 15 seconds with emphasis on cardiovascular endurance. Pt required 1 rest break due to SOB but O2 remained >98%. Stopped activity as pt reported feeling "worn out". Transported back to room and concluded session with pt sitting in [facility] with all needs within reach awaiting upcoming OT session.   PT Discharge Precautions/Restrictions Precautions Precautions: Fall;Other (comment) Precaution Comments: blind in L eye, monitor O2 Restrictions Weight Bearing Restrictions: Yes LLE Weight Bearing: Non weight bearing Pain Interference Pain Interference Pain Effect on Sleep: 4. Almost constantly Pain Interference with Therapy Activities: 3. Frequently Pain Interference with Day-to-Day Activities: 2. Occasionally Cognition Overall Cognitive Status: Within Functional Limits for tasks assessed Arousal/Alertness: Awake/alert Orientation Level: Oriented X4 Memory: Appears intact Awareness: Appears intact Problem Solving: Appears intact Safety/Judgment: Appears intact Sensation Sensation Light Touch: Impaired Detail Light Touch Impaired Details: Absent RLE;Impaired  LLE Hot/Cold: Not tested Stereognosis: Not tested Additional Comments: absent sensation from L3 dermatome and below on RLE and absent  sensation along proximal aspect of L residual limb and "tingling" along incision. Pt reported numbness/tingling/burning along entire RLE and phantom pain in L residual limb. Coordination Gross Motor Movements are Fluid and Coordinated: No Fine Motor Movements are Fluid and Coordinated: No Coordination and Movement Description: uncoordinated due to altered balance strategies from L AKA, pain, and blindness Finger Nose Finger Test: impaired bilaterally due to blindness Heel Shin Test: unable to perform on LLE due to AKA Motor  Motor Motor: Abnormal postural alignment and control Motor - Skilled Clinical Observations: altered balance strategies due to L AKA, pain, and blindness in L eye  Mobility Bed Mobility Bed Mobility: Rolling Right;Rolling Left;Sit to Supine Rolling Right: Independent with assistive device Rolling Left: Independent with assistive device Supine to Sit: Independent with assistive device Sit to Supine: Independent with assistive device Transfers Transfers: Lateral/Scoot Transfers Lateral/Scoot Transfers: Independent with assistive device Transfer (Assistive device): Other (Comment) (slideboard) Locomotion  Gait Ambulation: No Gait Gait: No Stairs / Additional Locomotion Stairs: No Corporate treasurer: Yes Wheelchair Assistance: Independent with Scientist, research (life sciences): Both upper extremities Wheelchair Parts Management: Needs assistance (due to blindness) Distance: 189ft  Trunk/Postural Assessment  Cervical Assessment Cervical Assessment: Exceptions to [facility] (cervical flexion) Thoracic Assessment Thoracic Assessment: Exceptions to [facility] (kyphosis) Lumbar Assessment Lumbar Assessment: Exceptions to [facility] (posterior pelvic tilt in sitting) Postural Control Postural Control: Deficits on  evaluation Righting Reactions: delayed and inadequate on L side Protective Responses: delayed and inadequate on L side  Balance Balance Balance Assessed: Yes Standardized Balance Assessment Standardized Balance Assessment:  (AMPnoPRO score: 6/39 - K0 classification level) Static Sitting Balance Static Sitting - Balance Support: Feet supported;Bilateral upper extremity supported Static Sitting - Level of Assistance: 7: Independent Dynamic Sitting Balance Dynamic Sitting - Balance Support: Feet supported;No upper extremity supported Dynamic Sitting - Level of Assistance: 6: Modified independent (Device/Increase time) Extremity Assessment  RLE Assessment RLE Assessment: Exceptions to [facility] General Strength Comments: tested sitting in WC RLE Strength RLE Overall Strength Comments: tested sitting in WC Right Hip Flexion: 3+/5 Right Hip ABduction: 3+/5 Right Hip ADduction: 3+/5 Right Knee Flexion: 3+/5 Right Knee Extension: 2-/5 Right Ankle Dorsiflexion: 0/5 Right Ankle Plantar Flexion: 2-/5 LLE Assessment LLE Assessment: Exceptions to [facility] General Strength Comments: tested sitting in WC LLE Strength Left Hip Flexion: 3/5 Left Hip Extension: 3/5 Left Hip ABduction: 3/5 Left Hip ADduction: 3/5   Makye Radle M Zaunegger [name] PT, DPT 12/13/2022, 6:55 AM

## 2022-12-13 NOTE — Plan of Care (Signed)
  Problem: RH Balance Goal: LTG Patient will maintain dynamic standing with ADLs (OT) Description: LTG:  Patient will maintain dynamic standing balance with assist during activities of daily living (OT)  12/13/2022 1015 by Melvyn Novas, OT Outcome: Not Applicable Flowsheets (Taken 12/13/2022 1015) LTG: Pt will maintain dynamic standing balance during ADLs with: (Goal DC due to pt intolerance with standing on RLE as well as due to poor wound healing and risk of amputation therefore no longer applicable) Supervision/Verbal cueing   Problem: Sit to Stand Goal: LTG:  Patient will perform sit to stand in prep for activites of daily living with assistance level (OT) Description: LTG:  Patient will perform sit to stand in prep for activites of daily living with assistance level (OT) 12/13/2022 1015 by Melvyn Novas, OT Outcome: Not Applicable Flowsheets (Taken 12/13/2022 1015) LTG: PT will perform sit to stand in prep for activites of daily living with assistance level: (Goal DC due to pt intolerance with standing on RLE as well as due to poor wound healing and risk of amputation therefore no longer applicable) Supervision/Verbal cueing

## 2022-12-13 NOTE — Progress Notes (Signed)
[facility] Progress Note   Subjective:   Patient reports feeling tired today, reiterates that he feels too much fluid was removed with last HD. Denies SOB, CP, dizziness, nausea.   Objective Vitals:   12/12/22 1926 12/12/22 1928 12/13/22 0500 12/13/22 0522  BP: (!) 88/59 (!) 92/57  97/63  Pulse: (!) 58 (!) 59  60  Resp: 16   16  Temp: 97.9 F (36.6 C)   97.8 F (36.6 C)  TempSrc: Oral   Oral  SpO2: 100% 98%  100%  Weight:   59 kg   Height:       Physical Exam General: Awake, alert,  NAD, on RA Heart: RRR, no murmurs, rubs or gallops Lungs: clear anteriorly, respirations unlabored Abdomen: Soft, non-distended, +BS Extremities: No edema L stump or RLE Dialysis Access: R internal jugular [facility]   Additional Objective Labs: Basic Metabolic Panel: Recent Labs  Lab 12/06/22 1430 12/08/22 1208 12/10/22 0654  NA 141 135 136  K 5.3* 4.5 4.8  CL 98 97* 98  CO2 24 23 25   GLUCOSE 93 87 89  BUN 70* 53* 42*  CREATININE 7.55* 6.31* 6.01*  CALCIUM 9.6 9.4 9.2  PHOS 4.0 3.5 3.0   Liver Function Tests: Recent Labs  Lab 12/06/22 1430 12/08/22 1208 12/10/22 0654  ALBUMIN 2.9* 3.1* 2.9*   No results for input(s): "LIPASE", "AMYLASE" in the last 168 hours. CBC: Recent Labs  Lab 12/06/22 1430 12/08/22 1208 12/10/22 0654  WBC 10.2 8.2 7.6  NEUTROABS  --   --  5.9  HGB 8.4* 9.1* 8.7*  HCT 30.1* 31.9* 31.5*  MCV 97.4 98.2 98.1  PLT 216 203 213   Blood Culture    Component Value Date/Time   SDES BLOOD LEFT ARM 11/03/2022 0855   SPECREQUEST  11/03/2022 0855    BOTTLES DRAWN AEROBIC AND ANAEROBIC Blood Culture adequate volume   CULT  11/03/2022 0855    NO GROWTH 5 DAYS Performed at [facility], [address] [place], [place] [address]    REPTSTATUS 11/08/2022 FINAL 11/03/2022 0855    Cardiac Enzymes: No results for input(s): "CKTOTAL", "CKMB", "CKMBINDEX", "TROPONINI" in the last 168 hours. CBG: No results for input(s): "GLUCAP" in the  last 168 hours. Iron Studies: No results for input(s): "IRON", "TIBC", "TRANSFERRIN", "FERRITIN" in the last 72 hours. @lablastinr3 @ Studies/Results: DG CHEST PORT 1 VIEW  Result Date: 12/11/2022 CLINICAL DATA:  Fatigue and shortness of breath. EXAM: PORTABLE CHEST 1 VIEW COMPARISON:  Chest radiograph dated 12/05/2022. FINDINGS: Dialysis catheter in similar position. There is cardiomegaly with vascular congestion and possible edema. Overall no significant interval change in the appearance of the lungs since the prior radiograph. No pneumothorax. No acute osseous pathology. IMPRESSION: No significant interval change since the prior radiograph. Electronically Signed   By: Elgie Collard M.D.   On: 12/11/2022 20:29   Medications:  sodium chloride Stopped (12/11/22 1758)   anticoagulant sodium citrate      acetaminophen  650 mg Oral TID WC & HS   amiodarone  200 mg Oral Daily   apixaban  2.5 mg Oral BID   bacitracin   Topical BID   Chlorhexidine Gluconate Cloth  6 each Topical Q12H   cinacalcet  30 mg Oral Q supper   darbepoetin (ARANESP) injection - DIALYSIS  100 mcg Subcutaneous Q Sat-1800   docusate sodium  100 mg Oral Daily   DULoxetine  20 mg Oral Daily   feeding supplement  237 mL Oral BID BM  gabapentin  300 mg Oral QHS   hydrocerin   Topical Daily   lidocaine  3 patch Transdermal Q24H   lidocaine   Topical TID   melatonin  3 mg Oral QHS   midodrine  15 mg Oral TID WC   multivitamin  1 tablet Oral QHS   polyethylene glycol  17 g Oral BID   senna-docusate  1 tablet Oral BID   sucroferric oxyhydroxide  500 mg Oral TID WC    Dialysis Orders: TTS [facility] 4h  2/2 bath  66kg   TDC   no heparin (allergy)  lock TDC w/ citrate - No ESA or VDRA - Binder: Renvela pwdr 4.8g TID - Sensipar 180mg  every day at home    Assessment/Plan: # Acute hypoxic respiratory failure: In setting of missed HD, pna w/ effusion. CXR with pleural effusion s/p chest tube 7/31 now removed. Continue UF with  HD as tolerated with albumin for BP support- UF goal decreased today as he felt bad post HD Saturday   # PNA/ L parapneumonic pleural effusion: s/p thoracentesis with cultures. S/p IV abx course and finished with augmentin   #Left leg wound - s/p AKA 8/15   # ESRD: continue HD per TTS schedule.  UF as able with support of albumin.  Hypotension limits UF.  - note that he will need a lower EDW given the amputation.   - fluid restrictions are in place   #Heparin allergy - use citrate for cath lock.   #Hypotension/ volume:  He is on midodrine. UF as tolerated with HD - goal 1-1.5L today   #Anemia of ESRD: Hgb 8.7. Aranesp raised to 100 mcg every Sat for the 8/31 dose onward.  Anticipate need to further increase   # Secondary HPTH: switched to sevelamer per pharm recs.  At one point cinacalcet was held due to hypocalcemia but then resumed at 90 mg daily (half of his normal dose).  Stopped cinacalcet again on 8/26 due to hypocalcemia, Calcium now slightly high and PTH is 1125. Restarted sensipar 30mg  for now and follow trend. He was having trouble dealing with the texture of the renvela powder. Trying chewable velphoro 1 per meal- toelrating.    # Hypocalcemia - see above - held sensipar, resolved, restarting at lower dose   #A-fib: On amiodarone and eliquis - per primary team; has IVC filter   # H/o type B aortic dissection s/p repair (2004)   Rogers Blocker, PA-C 12/13/2022, 10:56 AM  [facility] Pager: [phone]

## 2022-12-14 ENCOUNTER — Other Ambulatory Visit (HOSPITAL_COMMUNITY): Payer: Self-pay

## 2022-12-14 MED ORDER — OXYMETAZOLINE HCL 0.05 % NA SOLN
1.0000 | Freq: Two times a day (BID) | NASAL | 0 refills | Status: DC
  Filled 2022-12-14: qty 30, 30d supply, fill #0

## 2022-12-14 MED ORDER — OXYMETAZOLINE HCL 0.05 % NA SOLN
1.0000 | Freq: Two times a day (BID) | NASAL | Status: DC
  Filled 2022-12-14: qty 30

## 2022-12-14 NOTE — Progress Notes (Signed)
PROGRESS NOTE   Subjective/Complaints: No new complaints this morning Discussed spinal brace for his back pain and he would like to try Discussed plan for d/c today   ROS: +Phantom limb pain- continues, nosebleeds, +peripheral neuropathy, +malaise- feels too much fluid was removed with dialysis, +low back pain Denies fevers, chills, N/V, abdominal pain, constipation, diarrhea, SOB, cough, chest pain, new weakness or paraesthesias.   Review of Systems  Constitutional:  Positive for malaise/fatigue. Negative for chills and fever.  HENT:  Negative for congestion.   Respiratory:  Negative for shortness of breath.   Cardiovascular:  Negative for chest pain.  Gastrointestinal:  Negative for abdominal pain, nausea and vomiting.  Musculoskeletal:  Positive for joint pain.  Neurological:  Positive for weakness.     Objective:   No results found. No results for input(s): "WBC", "HGB", "HCT", "PLT" in the last 72 hours.  No results for input(s): "NA", "K", "CL", "CO2", "GLUCOSE", "BUN", "CREATININE", "CALCIUM" in the last 72 hours.   Intake/Output Summary (Last 24 hours) at 12/14/2022 0933 Last data filed at 12/14/2022 0745 Gross per 24 hour  Intake 240 ml  Output 0 ml  Net 240 ml        Physical Exam: Vital Signs Blood pressure 109/71, pulse 61, temperature 98.2 F (36.8 C), resp. rate 17, height 6\' 3"  (1.905 m), weight 59 kg, SpO2 100%. Gen: no distress, lying in bed, BMI 16.26 Eyes: Pupils equal and round.  Looks in general direction.  Says can see shapes, but nothing clear  Cardiovascular:    RRR Pulmonary: CTAB, nonlabored Abdominal:     General: Abdomen is flat. There is no distension.     Palpations: Abdomen is soft.     Tenderness: There is no abdominal tenderness.  Musculoskeletal:        General: No swelling or tenderness.     Cervical back: Neck supple. No tenderness.     Comments: Amputation site well  wrapped in figure-of-eight for Ace bandage, loose and dog-eared; rewrapped  Right foot with overgrown toe nails.   Ues 5-/5 B/L RLE- HF 4-/5; otherwise DF 0/5 and PF and KE 2-/5 LLE- L AKA- with HF 4-/5 Low back TTP Skin:     General: Skin is warm and dry.     Comments: Boggy area infra clavicular area--non tender and reported to be stable. Multiple grafts RUE--non functional. R-internal jugular noted. RLE with areas of discoloration and stasis changes.  1+ edema right residual limb. R HD catheter looks OK   Leg/ L AKA- tip elevated on pillow-dressing with some dried blood, staples appear well-approximated and intact.  No apparent drainage or edema.  Healing well.  Neurological:     Mental Status: Sleeping initially but wakes to voice.  He is alert and oriented to person, place, and time.     Comments: Decreased to absent below R knee, Decreased sensation in hands  Psychiatric:     Cooperative and appropriate  Assessment/Plan: 1. Functional deficits which require 3+ hours per day of interdisciplinary therapy in a comprehensive inpatient rehab setting. Physiatrist is providing close team supervision and 24 hour management of active medical problems listed below. Physiatrist and rehab team  continue to assess barriers to discharge/monitor patient progress toward functional and medical goals  Care Tool:  Bathing    Body parts bathed by patient: Right arm, Left arm, Chest, Abdomen, Front perineal area, Buttocks, Right upper leg, Left upper leg, Left lower leg, Face     Body parts n/a: Left lower leg   Bathing assist Assist Level: Supervision/Verbal cueing     Upper Body Dressing/Undressing Upper body dressing   What is the patient wearing?: Pull over shirt    Upper body assist Assist Level: Set up assist    Lower Body Dressing/Undressing Lower body dressing      What is the patient wearing?: Pants, Ace wrap/stump shrinker     Lower body assist Assist for lower body  dressing: Set up assist     Toileting Toileting    Toileting assist Assist for toileting: Supervision/Verbal cueing     Transfers Chair/bed transfer  Transfers assist  Chair/bed transfer activity did not occur: Safety/medical concerns  Chair/bed transfer assist level: Independent with assistive device Chair/bed transfer assistive device: Other (slideboard)   Locomotion Ambulation   Ambulation assist   Ambulation activity did not occur: Safety/medical concerns (pain, poor condition of RLE)          Walk 10 feet activity   Assist  Walk 10 feet activity did not occur: Safety/medical concerns (pain, poor condition of RLE)        Walk 50 feet activity   Assist Walk 50 feet with 2 turns activity did not occur: Safety/medical concerns (pain, poor condition of RLE)         Walk 150 feet activity   Assist Walk 150 feet activity did not occur: Safety/medical concerns (pain, poor condition of RLE)         Walk 10 feet on uneven surface  activity   Assist Walk 10 feet on uneven surfaces activity did not occur: Safety/medical concerns (pain, poor condition of RLE)         Wheelchair     Assist Is the patient using a wheelchair?: Yes Type of Wheelchair: Manual Wheelchair activity did not occur: Safety/medical concerns (pain)  Wheelchair assist level: Independent Max wheelchair distance: 163ft    Wheelchair 50 feet with 2 turns activity    Assist    Wheelchair 50 feet with 2 turns activity did not occur: Safety/medical concerns (pain)   Assist Level: Independent   Wheelchair 150 feet activity     Assist  Wheelchair 150 feet activity did not occur: Safety/medical concerns (pain)   Assist Level: Independent   Blood pressure 109/71, pulse 61, temperature 98.2 F (36.8 C), resp. rate 17, height 6\' 3"  (1.905 m), weight 59 kg, SpO2 100%.  Medical Problem List and Plan: 1. Functional deficits secondary to  L AKA secondary to left foot  wound 2/2 PAD             -patient may not shower due to R HD catheter             -ELOS/Goals: 15 days supervision, extended to Wednesday  D/c today  2.  Antithrombotics: -DVT/anticoagulation:  Pharmaceutical: Continue Eliquis. IVC filter in place.              -antiplatelet therapy: N/A  3. Penile pain: topical bacitracin ordered, continue  4. Insomnia complicated by phantom pain: d/c amitriptyline since not helping with his sleep and cannot increase further given cardiac history. Add melatonin 3mg  HS  -Continues to be limited by phantom pain with difficulty  sleeping; already getting as needed Ambien, would not further dose increase sleep medications  5. Neuropsych/cognition: This patient is capable of making decisions on his own behalf. 6. Skin/Wound Care: Routine pressure relief measure 7. Fluids/Electrolytes/Nutrition:  Strict I/O. Daily weights. Diet changed to Renal per input from nephrology. 8. L-AKA: Monitor incision for healing. Dry dressing. Shrinkers ordered  -8-31, 9/1: Per patient, not tolerating shrinkers s, but tolerating figure-of-eight ace wrap.  Continue.  9. ESRD: Renal diet with 1200 cc FR/HD TTS at the end of the day to help with tolerance of therapy.   -9/8 nephrology ordered 500 normal saline bolus today 10. H/o AAA aneurysm rupture s/p repair:  with resultant paraplegia, sensory deficits from saddle down and cauda equina syndrome             --was able to walk till a few years ago when left knee gave out.              --has severe LLQ pain prior to defecation.Continue Senna/miralax--discussed suppository every other day but patient wants to think on it "that's too much"  11. Anemia of chronic disease:] 12. L- PNA and parapneumonic effusion s/p CT: Has completed 2 week course of antibiotics.  13. Fluid overload: Continue to monitor for symptoms and wean oxygen as tolerated.   -Weights up to baseline on 9/2 Filed Weights   12/10/22 1414 12/12/22 0500 12/13/22  0500  Weight: 52 kg 57 kg 59 kg    14. Neurogenic bowel: Will monitor for now.  Last bowel movement 9/2, d/c bisacodyl suppository  -9/8 last BM yesterday, continue to monitor    15. Neuropathic pain- will change gabapentin to 300 mg at bedtime, decrease amitriptyline to 10mg  since not helping with sleep and we cannot increase further due to cardiac history  D/c amitriptyline since it is not helping and given cardiac risk factors  Prescribing Home Zynex NexWave Stimulator Device and supplies as needed. IFC, NMES and TENS medically necessary Treatment Rx: Daily @ 30-40 minutes per treatment PRN. Zynex NexWave only, no substitutions. Treatment Goals: 1) To reduce and/or eliminate pain 2) To improve functional capacity and Activities of daily living 3) To reduce or prevent the need for oral medications 4) To improve circulation in the injured region 5) To decrease or prevent muscle spasm and muscle atrophy 6) To provide a self-management tool to the patient The patient has not sufficiently improved with conservative care. Numerous studies indexed by Medline and PubMed.gov have shown Neuromuscular, Interferential, and TENS stimulators to reduce pain, improve function, and reduce medication use in injured patients. Continued use of this evidence based, safe, drug free treatment is both reasonable and medically necessary at this time.  -cymbalta 20mg  daily started  -9/7 continue gabapentin 300 mg at bedtime, dosing options limited by ESRD, Cymbalta 20 mg started yesterday may take a few days for full response  -9/8 discussed may take a few days for Cymbalta take full effect  16. Leukocytosis: WBC reviewed and has normalized  17. Underweight: provide dietary education, monitor weights  18. Suboptimal vitamin D: will not start supplement due to allergy to doxecalciferol. Will order grounds pass so he can have sunlight exposure.   19. Chest pain, chronic: renal is aware, continue ultrafiltration with  dialysis  -9/8 called regarding chest discomfort.  Discomfort worsened with deep breathing however no shortness of breath.  EKG/troponins ordered.  Will check chest x-ray  Addendum, pt was noted to  have some chest wall soreness right side, can try  lidocaine patch to this area, continue  20.  Overnight epistaxis.  Reordered oxygen to be humidified. Resolved  21. Malaise: discussed that he feels too much fluid was removed with dialysis, oxygen reviewed and is satting well, weekend notes and nephrology notes reviewed, CXR and COVID test ordered to r/o infection, discussed that both are negative for new pathology  22. Bradycardia: continue to monitor HR TID, resolved      12/14/2022    5:13 AM 12/13/2022    7:49 PM 12/13/2022    7:02 PM  Vitals with BMI  Systolic 109 115 409  Diastolic 71 72 71  Pulse 61 61 62     23. Bloody clots in stool: anusol ordered, continue, discussed to let us know if happens again, discussed possibly secondary to internal hemorrhoids  BM 9/11, continue miralax  24. Peripheral neuropathy: -Discussed Qutenza as an option for neuropathic pain control. Discussed that this is a capsaicin patch, stronger than capsaicin cream. Discussed that it is currently approved for diabetic peripheral neuropathy and post-herpetic neuralgia, but that it has also shown benefit in treating other forms of neuropathy. Provided patient with link to site to learn more about the patch: https://www.clark.biz/. Discussed that the patch would be placed in office and benefits usually last 3 months. Discussed that unintended exposure to capsaicin can cause severe irritation of eyes, mucous membranes, respiratory tract, and skin, but that Qutenza is a local treatment and does not have the systemic side effects of other nerve medications. Discussed that there may be pain, itching, erythema, and decreased sensory function associated with the application of Qutenza. Side effects usually subside within 1  week. A cold pack of analgesic medications can help with these side effects. Blood pressure can also be increased due to pain associated with administration of the patch. Messaged clinic staff to schedule him for this procedure -continue gabapentin  25. Arthritis: prn voltaren gel ordered, discussed he would have to ask for this if he wants it  26. Low back pain: prescribed zynex spinal brace   >30 minutes spent in discharge of patient including review of medications and follow-up appointments, physical examination, and in answering all patient's questions   LOS: 15 days A FACE TO FACE EVALUATION WAS PERFORMED  Drema Pry Kadence Mimbs 12/14/2022, 9:33 AM

## 2022-12-14 NOTE — Progress Notes (Signed)
[facility] Progress Note   Subjective:   Going home today. Still having some R sided rib pain with deep breaths but no SOB, CP, dizziness, nausea.   Objective Vitals:   12/13/22 1840 12/13/22 1902 12/13/22 1949 12/14/22 0513  BP: 99/63 115/71 115/72 109/71  Pulse: 64 62 61 61  Resp: 16 18 18 17   Temp: 98.1 F (36.7 C) 97.6 F (36.4 C) 98.4 F (36.9 C) 98.2 F (36.8 C)  TempSrc:   Oral   SpO2: 100% 100% 100% 100%  Weight:      Height:       Physical Exam General: Awake, alert,  NAD, on RA Heart: RRR, no murmurs, rubs or gallops Lungs: clear, respirations unlabored Abdomen: non-distended Extremities: No edema L stump or RLE Dialysis Access: R internal jugular TDC   Additional Objective Labs: Basic Metabolic Panel: Recent Labs  Lab 12/08/22 1208 12/10/22 0654  NA 135 136  K 4.5 4.8  CL 97* 98  CO2 23 25  GLUCOSE 87 89  BUN 53* 42*  CREATININE 6.31* 6.01*  CALCIUM 9.4 9.2  PHOS 3.5 3.0   Liver Function Tests: Recent Labs  Lab 12/08/22 1208 12/10/22 0654  ALBUMIN 3.1* 2.9*   No results for input(s): "LIPASE", "AMYLASE" in the last 168 hours. CBC: Recent Labs  Lab 12/08/22 1208 12/10/22 0654  WBC 8.2 7.6  NEUTROABS  --  5.9  HGB 9.1* 8.7*  HCT 31.9* 31.5*  MCV 98.2 98.1  PLT 203 213   Blood Culture    Component Value Date/Time   SDES BLOOD LEFT ARM 11/03/2022 0855   SPECREQUEST  11/03/2022 0855    BOTTLES DRAWN AEROBIC AND ANAEROBIC Blood Culture adequate volume   CULT  11/03/2022 0855    NO GROWTH 5 DAYS Performed at [facility], [address], [place], [place] [address]    REPTSTATUS 11/08/2022 FINAL 11/03/2022 0855    Cardiac Enzymes: No results for input(s): "CKTOTAL", "CKMB", "CKMBINDEX", "TROPONINI" in the last 168 hours. CBG: No results for input(s): "GLUCAP" in the last 168 hours. Iron Studies: No results for input(s): "IRON", "TIBC", "TRANSFERRIN", "FERRITIN" in the last 72  hours. @lablastinr3 @ Studies/Results: No results found. Medications:  sodium chloride Stopped (12/11/22 1758)    acetaminophen  650 mg Oral TID WC & HS   amiodarone  200 mg Oral Daily   apixaban  2.5 mg Oral BID   bacitracin   Topical BID   Chlorhexidine Gluconate Cloth  6 each Topical Q12H   cinacalcet  30 mg Oral Q supper   darbepoetin (ARANESP) injection - DIALYSIS  100 mcg Subcutaneous Q Sat-1800   docusate sodium  100 mg Oral Daily   DULoxetine  20 mg Oral Daily   feeding supplement  237 mL Oral BID BM   gabapentin  300 mg Oral QHS   hydrocerin   Topical Daily   lidocaine  3 patch Transdermal Q24H   lidocaine   Topical TID   melatonin  3 mg Oral QHS   midodrine  15 mg Oral TID WC   multivitamin  1 tablet Oral QHS   polyethylene glycol  17 g Oral BID   senna-docusate  1 tablet Oral BID   sucroferric oxyhydroxide  500 mg Oral TID WC    Outpatient Dialysis Orders: TTS [facility] 4h  2/2 bath  66kg   TDC   no heparin (allergy)  lock TDC w/ citrate - No ESA or VDRA - Binder: Renvela pwdr 4.8g TID - Sensipar  180mg  every day at home  Assessment/Plan: # Acute hypoxic respiratory failure: In setting of missed HD, pna w/ effusion. CXR with pleural effusion s/p chest tube 7/31 now removed. Continue UF with HD as tolerated with albumin for BP support. New EDW seems to be around 59kg but bed weights have been variable, may need to adjust EDW outpatient.    # PNA/ L parapneumonic pleural effusion: s/p thoracentesis with cultures. S/p IV abx course and finished with augmentin   #Left leg wound - s/p AKA 8/15   # ESRD: continue HD per TTS schedule.  UF as able with support of albumin.  Hypotension limits UF.    #Heparin allergy - use citrate for cath lock.   #Hypotension/ volume:  He is on midodrine. UF as tolerated with HD   #Anemia of ESRD: Hgb 8.7. Aranesp raised to 100 mcg every Sat for the 8/31 dose onward.  Anticipate need to further increase   # Secondary HPTH: switched to  sevelamer per pharm recs.  At one point cinacalcet was held due to hypocalcemia but then resumed at 90 mg daily (half of his normal dose).  Stopped cinacalcet again on 8/26 due to hypocalcemia, Calcium now slightly high and PTH is 1125. Restarted sensipar 30mg  for now and follow trend. He was having trouble dealing with the texture of the renvela powder. Trying chewable velphoro 1 per meal- toelrating.    # Hypocalcemia - see above - held sensipar, resolved, restarting at lower dose   #A-fib: On amiodarone and eliquis - per primary team; has IVC filter   # H/o type B aortic dissection s/p repair (2004)   Rogers Blocker, PA-C 12/14/2022, 9:51 AM  [facility] Pager: [phone]

## 2022-12-14 NOTE — Discharge Planning (Signed)
[facility] Patient Discharge Orders- [facility] CLINIC: [place]  Patient's name: Corey [name] Admit/DC Dates: 11/29/2022 - 12/14/22, @ [facility] 10/27/22-11/29/22  Discharge Diagnoses: Debility  Acute hypoxic resp failure d/t pneumonia/pleural effusion/pulm edema  S/p L AKA 11/17/22  Aranesp: Given: Yes   Date and amount of last dose: on 12/10/22 Last Hgb: 8.7 PRBC's Given: No Date/# of units: N/A ESA dose for discharge: mircera 200 mcg IV q 2 weeks  IV Iron dose at discharge: None  Heparin change: None- heparin allergy, use citrate lock  EDW Change: Yes New EDW: 59kg  Bath Change: No  Access intervention/Change: No Details:  Hectorol/Calcitriol change: No  Discharge Labs: Calcium 9.2 Phosphorus 3.0 Albumin 2.9 K+ 4.8  IV Antibiotics: No Details:  On Coumadin?: No Last INR: Next INR: Managed By:   OTHER/APPTS/LAB ORDERS: Home sensipar dose decreased to 30mg  due to hypercalcemia, binder changed to velphoro    D/C Meds to be reconciled by nurse after every discharge.  Completed By: Rogers Blocker, PA-C 12/14/2022, 10:23 AM  [facility] Pager: [phone]    Reviewed by: MD:______ RN_______

## 2022-12-14 NOTE — Progress Notes (Signed)
Pt to d/c today. Contacted [facility] to advise clinic that pt will d/c today as planned and should resume care tomorrow.   Olivia Canter Renal Navigator [phone]

## 2022-12-15 ENCOUNTER — Telehealth: Payer: Self-pay

## 2022-12-15 NOTE — Transitions of Care (Post Inpatient/ED Visit) (Signed)
   12/15/2022  Name: Corey [name] MRN: [id number] DOB: Jun 13, 1970  Today's TOC FU Call Status: Today's TOC FU Call Status:: Successful TOC FU Call Completed TOC FU Call Complete Date: 12/15/22 Patient's Name and Date of Birth confirmed.  Transition Care Management Follow-up Telephone Call Date of Discharge: 12/14/22 Discharge Facility: [facility]) Type of Discharge: Inpatient Admission Primary Inpatient Discharge Diagnosis:: s/p left AKA- in patient rehab How have you been since you were released from the hospital?: Same (He said he is still getting used to the amputation. He said that the surgical site is healing and he has a sleeve on that they gave him in the hospital.) Any questions or concerns?: No  Items Reviewed: Did you receive and understand the discharge instructions provided?: Yes Medications obtained,verified, and reconciled?: Partial Review Completed Reason for Partial Mediation Review: He said he has all of his medications and did not have any questions about the med regime and he stated that his brother assists with medication management Any new allergies since your discharge?: No Dietary orders reviewed?: No Do you have support at home?: Yes People in Home: sibling(s) Name of Support/Comfort Primary Source: he stays with his brother.  Medications Reviewed Today: Medications Reviewed Today   Medications were not reviewed in this encounter     Home Care and Equipment/Supplies: Were Home Health Services Ordered?: Yes Name of Home Health Agency:: [facility] Has Agency set up a time to come to your home?: Yes First Home Health Visit Date: 12/16/22 Any new equipment or medical supplies ordered?: Yes Name of Medical supply agency?: [facility] - O2, hospital bed, BSC, transfer board.  He has the O2 concentrator and portable tanks. Were you able to get the equipment/medical supplies?: Yes Do you have any questions related to the use of the equipment/supplies?:  No  Functional Questionnaire: Do you need assistance with bathing/showering or dressing?: Yes (his brother assists as needed) Do you need assistance with meal preparation?: Yes (His brother assists as needed) Do you need assistance with eating?: No Do you have difficulty maintaining continence: No Do you need assistance with getting out of bed/getting out of a chair/moving?: Yes (He has a sliding board and needs assistance with transfers.  Dependent on wheelchair for mobility.) Do you have difficulty managing or taking your medications?: Yes  Follow up appointments reviewed: PCP Follow-up appointment confirmed?: Yes Date of PCP follow-up appointment?: 12/28/22 Follow-up Provider: Gwinda Passe, NP Specialist Hospital Follow-up appointment confirmed?: Yes Date of Specialist follow-up appointment?: 01/11/23 Follow-Up Specialty Provider:: GI, 01/13/2023- PMR  He attends dialysis T/T/S Do you need transportation to your follow-up appointment?: No (uses [facility]) Do you understand care options if your condition(s) worsen?: Yes-patient verbalized understanding    SIGNATURE Robyne Peers, RN

## 2022-12-15 NOTE — TOC Transition Note (Signed)
Transition of Care - Initial Contact from Inpatient Facility  Date of discharge: 12/14/22 Date of contact: 12/15/22  Method: Phone Spoke to: Patient  Patient contacted to discuss transition of care from recent inpatient hospitalization. Patient was admitted to [facility] from 11/29/22-12/14/22 with discharge diagnosis of s/p L AKA, debility, and acute hypoxic respiratory failure  The discharge medication list was reviewed. Patient understands the changes and has no concerns.   Patient currently on HD at [facility]. Next HD 12/17/22.  Salome Holmes, NP

## 2022-12-16 ENCOUNTER — Telehealth (INDEPENDENT_AMBULATORY_CARE_PROVIDER_SITE_OTHER): Payer: Self-pay | Admitting: Primary Care

## 2022-12-16 ENCOUNTER — Telehealth (INDEPENDENT_AMBULATORY_CARE_PROVIDER_SITE_OTHER): Payer: Self-pay

## 2022-12-16 NOTE — Telephone Encounter (Signed)
Returned call and provided verbal orders.

## 2022-12-16 NOTE — Telephone Encounter (Signed)
Returned call to [place] to give verbal orders lvm providing orders and if she has any questions or concerns to give me a call

## 2022-12-16 NOTE — Telephone Encounter (Signed)
Home Health Verbal Orders - Caller/Agency:  Brett Canales calling from [facility] Number: [phone] Verbal ok VM Requesting OT/PT/Skilled Nursing/Social Work/Speech Therapy: PT Frequency: 1 W 3  Nursing eval and  HHA for 2 W 4

## 2022-12-16 NOTE — Telephone Encounter (Signed)
Copied from CRM [id number]. Topic: Quick Communication - Home Health Verbal Orders >> Dec 16, 2022 12:57 PM Everette C wrote: Caller/Agency: [facility] Simons  Callback Number:  [phone] Requesting OT/PT/Skilled Nursing/Social Work/Speech Therapy: OT Frequency: 1w5

## 2022-12-21 ENCOUNTER — Telehealth (INDEPENDENT_AMBULATORY_CARE_PROVIDER_SITE_OTHER): Payer: Self-pay

## 2022-12-21 NOTE — Telephone Encounter (Signed)
Copied from CRM [id number]. Topic: General - Other >> Dec 21, 2022  2:46 PM Ja-Kwan M wrote: Reason for CRM: Judeth Cornfield with [facility] reports that she went out for a nursing evaluation today but they will not be able to continue services with the pt. Per Judeth Cornfield pt uses oxygen and the brother smokes in the home. Judeth Cornfield stated she spoke with the brother about smoking in the home but he told her "don't get it twisted it's his home". Judeth Cornfield also reports that there was alcohol throughout the home and she does not feel safe going back out. Judeth Cornfield will also contact social services to report this. Cb# [phone]

## 2022-12-22 NOTE — Telephone Encounter (Signed)
Looks like referral was placed by hospital on 12/07/22 and per message in referral "Called pt to schedule referral appt but unable to leave a v/m due to mailbox being full, sent SMS message and postcard.

## 2022-12-28 ENCOUNTER — Ambulatory Visit (INDEPENDENT_AMBULATORY_CARE_PROVIDER_SITE_OTHER): Payer: 59 | Admitting: Primary Care

## 2023-01-02 ENCOUNTER — Ambulatory Visit (INDEPENDENT_AMBULATORY_CARE_PROVIDER_SITE_OTHER): Payer: 59 | Admitting: Physician Assistant

## 2023-01-02 VITALS — BP 123/72 | HR 65 | Temp 98.3°F | Resp 18 | Ht 75.0 in

## 2023-01-02 DIAGNOSIS — Z89612 Acquired absence of left leg above knee: Secondary | ICD-10-CM

## 2023-01-02 MED ORDER — OXYCODONE-ACETAMINOPHEN 5-325 MG PO TABS
1.0000 | ORAL_TABLET | Freq: Four times a day (QID) | ORAL | 0 refills | Status: DC | PRN
Start: 2023-01-02 — End: 2023-03-07

## 2023-01-02 NOTE — Progress Notes (Signed)
POST OPERATIVE OFFICE NOTE    CC:  F/u for surgery  HPI:  This is a 52 y.o. male who is s/p left above knee amputation on 11/17/22 by Dr. Myra Gianotti. He says he has constant burning and stinging pain along incision. He has been washing the incision with saline and wrapping it with ACE bandage. He lives at home by himself and is legally blind so he is unable to know if there has been any drainage. He says he has been using his right leg for transfers and has been trying to do the exercises that he was taught at hospital. He is mostly in wheel chair. He is oxygen dependent on 2L.   Allergies  Allergen Reactions   Ciprofloxacin Other (See Comments)    Aortic dissection   Heparin Other (See Comments)    UNSPECIFIED REACTION :  On Coumadin since 2004   HIT panel negative 01/19/17   Doxercalciferol Other (See Comments)   Tomato    Quinolones Other (See Comments)    unknown    Current Outpatient Medications  Medication Sig Dispense Refill   acetaminophen (TYLENOL) 325 MG tablet Take 2 tablets (650 mg total) by mouth 4 (four) times daily -  with meals and at bedtime.     amiodarone (PACERONE) 200 MG tablet Take 1 tablet (200 mg total) by mouth daily. 90 tablet 3   apixaban (ELIQUIS) 2.5 MG TABS tablet Take 1 tablet (2.5 mg total) by mouth 2 (two) times daily. 60 tablet 0   bacitracin ointment Apply topically 2 (two) times daily. 120 g 0   camphor-menthol (SARNA) lotion Apply 1 Application topically every 8 (eight) hours as needed for itching.     cinacalcet (SENSIPAR) 30 MG tablet Take 1 tablet (30 mg total) by mouth daily with supper. 30 tablet 0   diclofenac Sodium (VOLTAREN) 1 % GEL Apply 2 g topically 4 (four) times daily as needed (Arthritis). 100 g 0   docusate sodium (COLACE) 100 MG capsule Take 1 capsule (100 mg total) by mouth daily. 100 capsule 0   DULoxetine (CYMBALTA) 20 MG capsule Take 1 capsule (20 mg total) by mouth daily. 30 capsule 0   gabapentin (NEURONTIN) 300 MG capsule  Take 1 capsule (300 mg total) by mouth at bedtime. 30 capsule 0   hydrocerin (EUCERIN) CREA Apply 1 Application topically daily.     lidocaine (LIDODERM) 5 % Place 3 patches onto the skin daily. Remove & Discard patch within 12 hours or as directed by MD 90 patch 0   melatonin 3 MG TABS tablet Take 1 tablet (3 mg total) by mouth at bedtime. 60 tablet 0   methocarbamol (ROBAXIN) 500 MG tablet Take 1 tablet (500 mg total) by mouth every 6 (six) hours as needed for muscle spasms. 40 tablet 0   midodrine (PROAMATINE) 5 MG tablet Take 3 tablets (15 mg total) by mouth 3 (three) times daily with meals. 270 tablet 0   Multiple Vitamins-Minerals (MULTIVITAMIN WITH MINERALS) tablet Take 1 tablet by mouth daily.     oxymetazoline (AFRIN) 0.05 % nasal spray Place 1 spray into both nostrils 2 (two) times daily. 30 mL 0   polyethylene glycol powder (GLYCOLAX/MIRALAX) 17 GM/SCOOP powder Take 1 capful (17 g) with water by mouth 2 (two) times daily. 238 g 0   senna-docusate (SENOKOT-S) 8.6-50 MG tablet Take 1 tablet by mouth 2 (two) times daily. 60 tablet 0   sucroferric oxyhydroxide (VELPHORO) 500 MG chewable tablet Chew 1 tablet (500 mg total)  by mouth 3 (three) times daily with meals. 90 tablet 0   traMADol (ULTRAM) 50 MG tablet Take 1 tablet (50 mg total) by mouth 4 (four) times daily as needed for severe pain. 28 tablet 0   zolpidem (AMBIEN) 5 MG tablet Take 1 tablet (5 mg total) by mouth at bedtime as needed for sleep (Insomnia). 30 tablet 0   No current facility-administered medications for this visit.     ROS:  See HPI  Physical Exam:  Vitals:   01/02/23 1447  BP: 123/72  Pulse: 65  Resp: 18  Temp: 98.3 F (36.8 C)  SpO2: 98%   Incision:  Left AKA with staples, intact. There is a lot of dry scaly skin and scabbing along incision line. Some of the central staples are somewhat embedded. The incision line is very tender especially midline. Not able to express any drainage. No appreciable fluid  collections Neuro: alert and oriented  Assessment/Plan:  This is a 52 y.o. male who is s/p: left above knee amputation on 11/17/22 by Dr. Myra Gianotti. The left AKA is intact. Flaps viable. He is having a lot of burning pain along incision line. Staples have been in almost 6 weeks so they are a little embedded especially in the mid portion of the AKA. I was able to take out several on the medial and lateral aspects of the incision but he was not able to tolerate this and certainly wont tolerate getting the ones out in the mid section without taking pain medication prior. I have offered to have him return soon and I will send in prescription for a couple Percocet to take 1 hr-30 min prior to arrival at his next visit. We will try to get him back in later this week to attempt to take the staples out. He dialyzes on TTS so will try to get him back in either on Wednesday or Friday to get the staples out   Nathanial Rancher, [facility] [phone]   Clinic MD:  Myra Gianotti

## 2023-01-09 ENCOUNTER — Ambulatory Visit (INDEPENDENT_AMBULATORY_CARE_PROVIDER_SITE_OTHER): Payer: 59

## 2023-01-09 ENCOUNTER — Other Ambulatory Visit (HOSPITAL_COMMUNITY): Payer: Self-pay

## 2023-01-09 ENCOUNTER — Encounter: Payer: Self-pay | Admitting: Physician Assistant

## 2023-01-09 VITALS — BP 120/75 | HR 70 | Temp 98.3°F | Resp 18 | Ht 75.0 in

## 2023-01-09 DIAGNOSIS — Z89612 Acquired absence of left leg above knee: Secondary | ICD-10-CM

## 2023-01-09 NOTE — Progress Notes (Addendum)
POST OPERATIVE OFFICE NOTE    CC:  F/u for surgery  HPI:  This is a 52 y.o. male who is s/p left above-the-knee amputation on 11/17/2022 by Dr. Myra Gianotti.  He was seen in the office on 01/02/2023.  At that time he had some of the staples removed from his left above-the-knee amputation.  He continues to have sharp shooting stabbing pains as well as phantom pains.  He denies any drainage from his incision however states he is unsure.  He denies fevers, chills, nausea/vomiting.  Allergies  Allergen Reactions   Ciprofloxacin Other (See Comments)    Aortic dissection   Heparin Other (See Comments)    UNSPECIFIED REACTION :  On Coumadin since 2004   HIT panel negative 01/19/17   Doxercalciferol Other (See Comments)   Tomato    Quinolones Other (See Comments)    unknown    Current Outpatient Medications  Medication Sig Dispense Refill   acetaminophen (TYLENOL) 325 MG tablet Take 2 tablets (650 mg total) by mouth 4 (four) times daily -  with meals and at bedtime.     amiodarone (PACERONE) 200 MG tablet Take 1 tablet (200 mg total) by mouth daily. 90 tablet 3   apixaban (ELIQUIS) 2.5 MG TABS tablet Take 1 tablet (2.5 mg total) by mouth 2 (two) times daily. 60 tablet 0   bacitracin ointment Apply topically 2 (two) times daily. 120 g 0   camphor-menthol (SARNA) lotion Apply 1 Application topically every 8 (eight) hours as needed for itching.     cinacalcet (SENSIPAR) 30 MG tablet Take 1 tablet (30 mg total) by mouth daily with supper. 30 tablet 0   diclofenac Sodium (VOLTAREN) 1 % GEL Apply 2 g topically 4 (four) times daily as needed (Arthritis). 100 g 0   docusate sodium (COLACE) 100 MG capsule Take 1 capsule (100 mg total) by mouth daily. 100 capsule 0   DULoxetine (CYMBALTA) 20 MG capsule Take 1 capsule (20 mg total) by mouth daily. 30 capsule 0   gabapentin (NEURONTIN) 300 MG capsule Take 1 capsule (300 mg total) by mouth at bedtime. 30 capsule 0   hydrocerin (EUCERIN) CREA Apply 1  Application topically daily.     lidocaine (LIDODERM) 5 % Place 3 patches onto the skin daily. Remove & Discard patch within 12 hours or as directed by MD 90 patch 0   melatonin 3 MG TABS tablet Take 1 tablet (3 mg total) by mouth at bedtime. 60 tablet 0   methocarbamol (ROBAXIN) 500 MG tablet Take 1 tablet (500 mg total) by mouth every 6 (six) hours as needed for muscle spasms. 40 tablet 0   midodrine (PROAMATINE) 5 MG tablet Take 3 tablets (15 mg total) by mouth 3 (three) times daily with meals. 270 tablet 0   Multiple Vitamins-Minerals (MULTIVITAMIN WITH MINERALS) tablet Take 1 tablet by mouth daily.     oxyCODONE-acetaminophen (PERCOCET) 5-325 MG tablet Take 1 tablet by mouth every 6 (six) hours as needed for severe pain. 6 tablet 0   oxymetazoline (AFRIN) 0.05 % nasal spray Place 1 spray into both nostrils 2 (two) times daily. 30 mL 0   polyethylene glycol powder (GLYCOLAX/MIRALAX) 17 GM/SCOOP powder Take 1 capful (17 g) with water by mouth 2 (two) times daily. 238 g 0   senna-docusate (SENOKOT-S) 8.6-50 MG tablet Take 1 tablet by mouth 2 (two) times daily. 60 tablet 0   sucroferric oxyhydroxide (VELPHORO) 500 MG chewable tablet Chew 1 tablet (500 mg total) by mouth 3 (three)  times daily with meals. 90 tablet 0   traMADol (ULTRAM) 50 MG tablet Take 1 tablet (50 mg total) by mouth 4 (four) times daily as needed for severe pain. 28 tablet 0   zolpidem (AMBIEN) 5 MG tablet Take 1 tablet (5 mg total) by mouth at bedtime as needed for sleep (Insomnia). 30 tablet 0   No current facility-administered medications for this visit.     ROS:  See HPI  Physical Exam:  Vitals:   01/09/23 1257  BP: 120/75  Pulse: 70  Resp: 18  Temp: 98.3 F (36.8 C)  TempSrc: Temporal  SpO2: 96%  Height: 6\' 3"  (1.905 m)    Incision: Abundant dead skin as well as dry eschar along the incision of the above-the-knee amputation Neuro: A&O  Assessment/Plan:  This is a 52 y.o. male who is s/p: Left AKA  The  remainder of the staples were removed today.  He has dry eschar along most of the incision.  I asked him to cleanse his incision with soap and water at least daily.  He can continue dry dressing changes as needed.  He will notify our office if the incision fails to heal completely.  He is aware he would need a surgical revision if the incision is not completely healed.  He can otherwise follow-up for ABIs in 6 months.  Patient was also referred to the pain clinic for management of ongoing phantom pain.  He was made aware that we will no longer prescribe pain medication related to his left above-the-knee amputation surgery.  Emilie Rutter, PA-C [facility] [phone]  Clinic MD:  Myra Gianotti

## 2023-01-10 ENCOUNTER — Ambulatory Visit: Payer: 59 | Admitting: Physical Medicine and Rehabilitation

## 2023-01-11 ENCOUNTER — Telehealth: Payer: Self-pay | Admitting: Primary Care

## 2023-01-11 ENCOUNTER — Telehealth (INDEPENDENT_AMBULATORY_CARE_PROVIDER_SITE_OTHER): Payer: Self-pay | Admitting: Primary Care

## 2023-01-11 ENCOUNTER — Ambulatory Visit: Payer: 59 | Admitting: Gastroenterology

## 2023-01-11 ENCOUNTER — Ambulatory Visit (INDEPENDENT_AMBULATORY_CARE_PROVIDER_SITE_OTHER): Payer: 59 | Admitting: Primary Care

## 2023-01-11 NOTE — Telephone Encounter (Signed)
Home Health Verbal Orders - Caller/Agency: [facility] Number: [phone]  Requesting OT/PT Frequency: ?

## 2023-01-11 NOTE — Telephone Encounter (Signed)
Medication Refill - Medication: gabapetin 300 mg/tramadol 50mg /sucroferric oxyhydroxide (VELPHORO) 500 MG chewable tablet/ eloquis 2.5 / diclofenac Sodium (VOLTAREN) 1 %  Has the patient contacted their pharmacy? no Pt requested this while on phone for appt  Preferred Pharmacy (with phone number or street name):  [facility] - [place], [place] - [address] Phone: [phone]  Fax: [phone]     Has the patient been seen for an appointment in the last year OR does the patient have an upcoming appointment? yes  Agent: Please be advised that RX refills may take up to 3 business days. We ask that you follow-up with your pharmacy.

## 2023-01-11 NOTE — Telephone Encounter (Signed)
Copied from CRM [id number]. Topic: General - Other >> Jan 11, 2023 11:23 AM Turkey B wrote: Reason for CRM: pt called in asking to get a rx for portable Oxygen machine

## 2023-01-11 NOTE — Telephone Encounter (Signed)
Copied from CRM [id number]. Topic: Appointment Scheduling - Scheduling Inquiry for Clinic >> Jan 11, 2023 11:08 AM Benetta Spar B wrote: Reason for CRM: pt called in says he was told by the hospital that he doesn't have a liver problem, so he is wondering why he has an appt with the GI Dr today. Please cb

## 2023-01-12 NOTE — Telephone Encounter (Signed)
Will forward to provider  

## 2023-01-12 NOTE — Telephone Encounter (Signed)
Pt didn't show up to previous scheduled hospital f/u. Pt is scheduled for 10/18.

## 2023-01-13 ENCOUNTER — Encounter: Payer: 59 | Attending: Physical Medicine and Rehabilitation | Admitting: Physical Medicine and Rehabilitation

## 2023-01-13 DIAGNOSIS — G629 Polyneuropathy, unspecified: Secondary | ICD-10-CM

## 2023-01-16 NOTE — Telephone Encounter (Signed)
Pt has an appt in 10/18 will call to give verbals orders when patient comes to appt

## 2023-01-16 NOTE — Telephone Encounter (Signed)
I sent form back to you. IDK what I'm suppose to check and I had Marcelino Duster sign it already. Was not able to find a recent height and weight on him

## 2023-01-17 NOTE — Telephone Encounter (Signed)
Filled form out and have faxed to [facility]

## 2023-01-20 ENCOUNTER — Other Ambulatory Visit: Payer: Self-pay | Admitting: Cardiology

## 2023-01-20 ENCOUNTER — Ambulatory Visit (INDEPENDENT_AMBULATORY_CARE_PROVIDER_SITE_OTHER): Payer: 59 | Admitting: Primary Care

## 2023-01-20 ENCOUNTER — Telehealth (INDEPENDENT_AMBULATORY_CARE_PROVIDER_SITE_OTHER): Payer: Self-pay

## 2023-01-20 VITALS — BP 143/81 | HR 61 | Temp 98.1°F | Resp 16

## 2023-01-20 DIAGNOSIS — G629 Polyneuropathy, unspecified: Secondary | ICD-10-CM | POA: Diagnosis not present

## 2023-01-20 MED ORDER — MIDODRINE HCL 5 MG PO TABS: 15.0000 mg | ORAL_TABLET | Freq: Three times a day (TID) | ORAL | 2 refills | Status: DC

## 2023-01-20 MED ORDER — DICLOFENAC SODIUM 1 % EX GEL: 2.0000 g | Freq: Four times a day (QID) | CUTANEOUS | 1 refills | Status: DC | PRN

## 2023-01-20 MED ORDER — AMIODARONE HCL 200 MG PO TABS: 200.0000 mg | ORAL_TABLET | Freq: Every day | ORAL | 2 refills | Status: DC

## 2023-01-20 MED ORDER — DULOXETINE HCL 20 MG PO CPEP: 20.0000 mg | ORAL_CAPSULE | Freq: Every day | ORAL | 0 refills | Status: DC

## 2023-01-20 MED ORDER — APIXABAN 2.5 MG PO TABS: 2.5000 mg | ORAL_TABLET | Freq: Two times a day (BID) | ORAL | 5 refills | Status: DC

## 2023-01-20 MED ORDER — GABAPENTIN 300 MG PO CAPS
300.0000 mg | ORAL_CAPSULE | Freq: Every day | ORAL | 1 refills | Status: DC
Start: 2023-01-20 — End: 2023-06-07

## 2023-01-20 NOTE — Telephone Encounter (Signed)
*  STAT* If patient is at the pharmacy, call can be transferred to refill team.   1. Which medications need to be refilled? (please list name of each medication and dose if known)  amiodarone (PACERONE) 200 MG tablet  apixaban (ELIQUIS) 2.5 MG TABS tablet  midodrine (PROAMATINE) 5 MG tablet   2. Would you like to learn more about the convenience, safety, & potential cost savings by using the [facility]? No     3. Are you open to using the [facility] (Type [facility]. No   4. Which pharmacy/location (including street and city if local pharmacy) is medication to be sent to?  [facility] - [place] - [address]     5. Do they need a 30 day or 90 day supply? 90 day

## 2023-01-20 NOTE — Progress Notes (Unsigned)
[facility]  Corey [name], is a 52 y.o. male  [id number]  [id number]  DOB - [birth year]  No chief complaint on file.      Subjective:   Corey [name] is a 52 y.o. male here today for a follow up visit. Patient has No headache, No chest pain, No abdominal pain - No Nausea, No new weakness tingling or numbness, No Cough - shortness of breath. Requestion medication refills. Called cardiology to refill cardiac medication message will be referred to nurse Ann Held. Told patient needed to call for cardiology appt. Dr. Anne Fu , his PA is advise next week.    No problems updated.  Allergies  Allergen Reactions   Ciprofloxacin Other (See Comments)    Aortic dissection   Heparin Other (See Comments)    UNSPECIFIED REACTION :  On Coumadin since 2004   HIT panel negative 01/19/17   Doxercalciferol Other (See Comments)   Tomato    Quinolones Other (See Comments)    unknown    Past Medical History:  Diagnosis Date   Arthritis    hands and shoulders   Blindness and low vision    "Stargardt disease"   Dissection of aorta (HCC) 2004   a. s/p extensive repeair in 2004 in [place] complicated by ESRD, lower extremity paralysis, coma, and extended hospitalization of 2 years   ESRD (end stage renal disease) (HCC)    a. TTS   Headache    History of cardioversion 2014   Hypertension    Neuropathy    Non-healing non-surgical wound 03/2016   PAF (paroxysmal atrial fibrillation) (HCC)    a. s/p DCCV in 2014; b. on Coumadin; c. CHADS2VASc => 2 (HTN, vascular disease)   Paralysis (HCC)    due to dissection of aorta in 2004, lower extremities   Pneumonia     Current Outpatient Medications on File Prior to Visit  Medication Sig Dispense Refill   acetaminophen (TYLENOL) 325 MG tablet Take 2 tablets (650 mg total) by mouth 4 (four) times daily -  with meals and at bedtime.     amiodarone (PACERONE) 200 MG tablet Take 1 tablet (200 mg total) by mouth daily. 90 tablet 3    apixaban (ELIQUIS) 2.5 MG TABS tablet Take 1 tablet (2.5 mg total) by mouth 2 (two) times daily. 60 tablet 0   bacitracin ointment Apply topically 2 (two) times daily. 120 g 0   camphor-menthol (SARNA) lotion Apply 1 Application topically every 8 (eight) hours as needed for itching.     cinacalcet (SENSIPAR) 30 MG tablet Take 1 tablet (30 mg total) by mouth daily with supper. 30 tablet 0   diclofenac Sodium (VOLTAREN) 1 % GEL Apply 2 g topically 4 (four) times daily as needed (Arthritis). 100 g 0   docusate sodium (COLACE) 100 MG capsule Take 1 capsule (100 mg total) by mouth daily. 100 capsule 0   DULoxetine (CYMBALTA) 20 MG capsule Take 1 capsule (20 mg total) by mouth daily. 30 capsule 0   gabapentin (NEURONTIN) 300 MG capsule Take 1 capsule (300 mg total) by mouth at bedtime. 30 capsule 0   hydrocerin (EUCERIN) CREA Apply 1 Application topically daily.     lidocaine (LIDODERM) 5 % Place 3 patches onto the skin daily. Remove & Discard patch within 12 hours or as directed by MD 90 patch 0   melatonin 3 MG TABS tablet Take 1 tablet (3 mg total) by mouth at bedtime. 60 tablet 0   methocarbamol (ROBAXIN) 500 MG tablet  Take 1 tablet (500 mg total) by mouth every 6 (six) hours as needed for muscle spasms. 40 tablet 0   midodrine (PROAMATINE) 5 MG tablet Take 3 tablets (15 mg total) by mouth 3 (three) times daily with meals. 270 tablet 0   Multiple Vitamins-Minerals (MULTIVITAMIN WITH MINERALS) tablet Take 1 tablet by mouth daily.     oxyCODONE-acetaminophen (PERCOCET) 5-325 MG tablet Take 1 tablet by mouth every 6 (six) hours as needed for severe pain. 6 tablet 0   oxymetazoline (AFRIN) 0.05 % nasal spray Place 1 spray into both nostrils 2 (two) times daily. 30 mL 0   polyethylene glycol powder (GLYCOLAX/MIRALAX) 17 GM/SCOOP powder Take 1 capful (17 g) with water by mouth 2 (two) times daily. 238 g 0   senna-docusate (SENOKOT-S) 8.6-50 MG tablet Take 1 tablet by mouth 2 (two) times daily. 60 tablet 0    sucroferric oxyhydroxide (VELPHORO) 500 MG chewable tablet Chew 1 tablet (500 mg total) by mouth 3 (three) times daily with meals. 90 tablet 0   traMADol (ULTRAM) 50 MG tablet Take 1 tablet (50 mg total) by mouth 4 (four) times daily as needed for severe pain. 28 tablet 0   zolpidem (AMBIEN) 5 MG tablet Take 1 tablet (5 mg total) by mouth at bedtime as needed for sleep (Insomnia). 30 tablet 0   No current facility-administered medications on file prior to visit.    Objective:   Vitals:   01/20/23 1131  BP: (!) 143/81  Pulse: 61  Resp: 16  Temp: 98.1 F (36.7 C)  SpO2: 93%    Comprehensive ROS Pertinent positive and negative noted in HPI   Data Review No results found for: "HGBA1C"  Assessment & Plan   There are no diagnoses linked to this encounter. There are no diagnoses linked to this encounter.   Patient have been counseled extensively about nutrition and exercise. Other issues discussed during this visit include: low cholesterol diet, weight control and daily exercise, foot care, annual eye examinations at Ophthalmology, importance of adherence with medications and regular follow-up. We also discussed long term complications of uncontrolled diabetes and hypertension.   No follow-ups on file.  The patient was given clear instructions to go to ER or return to medical center if symptoms don't improve, worsen or new problems develop. The patient verbalized understanding. The patient was told to call to get lab results if they haven't heard anything in the next week.   This note has been created with Education officer, environmental. Any transcriptional errors are unintentional.   [name], NP 01/20/2023, 12:16 PM

## 2023-01-20 NOTE — Telephone Encounter (Signed)
This is not the number to contact home health to give verbal orders this is the pt number

## 2023-01-20 NOTE — Telephone Encounter (Signed)
Pt's medications were sent to pt's pharmacy as requested. Confirmation received.  

## 2023-01-20 NOTE — Telephone Encounter (Signed)
Copied from CRM [id number]. Topic: General - Inquiry >> Jan 19, 2023  9:48 AM De Blanch wrote: Reason for CRM: Cala Bradford Case Manager is requesting PCP send a referral for personal care services. Please send referral to [facility]  Pt recently had an amputation and is dealing with some drepression. Asking for a referral to a counselor (saddness not feeling like harming himself).  [facility]   Callback [phone] Fax - [phone]    Please advise.

## 2023-01-20 NOTE — Telephone Encounter (Signed)
Prescription refill request for Eliquis received. Indication:afib Last office visit:5/24 Scr:5.76  8/24 Age: 52 Weight:59  kg  Prescription refilled

## 2023-01-23 ENCOUNTER — Telehealth (INDEPENDENT_AMBULATORY_CARE_PROVIDER_SITE_OTHER): Payer: Self-pay | Admitting: Primary Care

## 2023-01-23 NOTE — Telephone Encounter (Signed)
Pt is calling to report that he was last seen in office on 01/20/23. Pt forgot to request order for HHA.  Also requesting a order for in home PT. Pleas advise CB- [phone]

## 2023-01-23 NOTE — Telephone Encounter (Signed)
Will forward to provider  

## 2023-01-24 ENCOUNTER — Other Ambulatory Visit (INDEPENDENT_AMBULATORY_CARE_PROVIDER_SITE_OTHER): Payer: Self-pay | Admitting: Primary Care

## 2023-01-24 ENCOUNTER — Telehealth: Payer: Self-pay

## 2023-01-24 DIAGNOSIS — Z89612 Acquired absence of left leg above knee: Secondary | ICD-10-CM

## 2023-01-24 NOTE — Telephone Encounter (Signed)
Request for [facility] service RE:  PT and HHA Sent to   [facility] (faxed) [facility] (Community message) [facility] (Community message) [facility] (Community message) [facility] (faxed)  Awaiting response.

## 2023-01-24 NOTE — Telephone Encounter (Signed)
Call received from [facility]. They are unable to accept patient d/t staffing.

## 2023-01-25 ENCOUNTER — Telehealth (INDEPENDENT_AMBULATORY_CARE_PROVIDER_SITE_OTHER): Payer: Self-pay | Admitting: Primary Care

## 2023-01-25 ENCOUNTER — Ambulatory Visit (INDEPENDENT_AMBULATORY_CARE_PROVIDER_SITE_OTHER): Payer: Self-pay

## 2023-01-25 ENCOUNTER — Encounter (INDEPENDENT_AMBULATORY_CARE_PROVIDER_SITE_OTHER): Payer: Self-pay

## 2023-01-25 NOTE — Telephone Encounter (Signed)
Routing to SW. [id number] for a referral to a counselor (saddness not feeling like harming himself).

## 2023-01-25 NOTE — Telephone Encounter (Signed)
Copied from CRM [id number]. Topic: General - Other >> Jan 25, 2023 12:07 PM Tiffany B wrote: Home Health Verbal Orders - Caller/Agency: PT from [facility]  Callback Number:  [phone]  Requesting PT Frequency: 1x 9

## 2023-01-25 NOTE — Telephone Encounter (Signed)
PT Frequency: 1x 9 and Home health Aide V.O. given to Renee Rival RN with [facility]

## 2023-01-25 NOTE — Telephone Encounter (Signed)
Received. Spoke with pt earlier he stated that he prays a lot, has family support, and is very sad abut his leg amputation. Pt is willing to have a therapist but we would need to refer him out due to his dialysis schedule and LCSWA. Pt received a list of counseling services, via MyChart

## 2023-01-25 NOTE — Telephone Encounter (Signed)
  Chief Complaint: pain foot Symptoms: pain , numbness, ulcer on heel Frequency: chronic recent hospitalization  Pertinent Negatives: Patient denies fever Disposition: [] ED /[] Urgent Care (no appt availability in office) / [x] Appointment(In office/virtual)/ []  [facility]/ [] Home Care/ [] Refused Recommended Disposition /[]  Mobile Bus/ []  Follow-up with PCP Additional Notes: warm transferred call to [facility]-  Reason for Disposition  [1] SEVERE pain (e.g., excruciating, unable to do any normal activities) AND [2] not improved after 2 hours of pain medicine  Answer Assessment - Initial Assessment Questions 1. ONSET: "When did the pain start?"      chronic 2. LOCATION: "Where is the pain located?"      Right foot *No Answer* 3. PAIN: "How bad is the pain?"    (Scale 1-10; or mild, moderate, severe)  - MILD (1-3): doesn't interfere with normal activities.   - MODERATE (4-7): interferes with normal activities (e.g., work or school) or awakens from sleep, limping.   - SEVERE (8-10): excruciating pain, unable to do any normal activities, unable to walk.      moderate 4. WORK OR EXERCISE: "Has there been any recent work or exercise that involved this part of the body?"      no 5. CAUSE: "What do you think is causing the foot pain?"     Ulcer on heel-  6. OTHER SYMPTOMS: "Do you have any other symptoms?" (e.g., leg pain, rash, fever, numbness)     Long toenails 7. PREGNANCY: "Is there any chance you are pregnant?" "When was your last menstrual period?"     N/a  Protocols used: Foot Pain-[facility]

## 2023-01-25 NOTE — Telephone Encounter (Addendum)
Patient has been excepted by [facility] and HHA V.O. given to sherry miles [employer]

## 2023-01-27 NOTE — Telephone Encounter (Signed)
Noted  

## 2023-01-27 NOTE — Telephone Encounter (Signed)
noted 

## 2023-01-31 ENCOUNTER — Telehealth (INDEPENDENT_AMBULATORY_CARE_PROVIDER_SITE_OTHER): Payer: Self-pay | Admitting: Primary Care

## 2023-01-31 NOTE — Telephone Encounter (Signed)
Called pt to move to virtual.

## 2023-01-31 NOTE — Telephone Encounter (Signed)
Spoke to pt. Will be at apt.  

## 2023-02-01 ENCOUNTER — Telehealth (INDEPENDENT_AMBULATORY_CARE_PROVIDER_SITE_OTHER): Payer: 59 | Admitting: Primary Care

## 2023-02-01 NOTE — Progress Notes (Deleted)
Sent link no respnds. Called patient at 1:59 LVM

## 2023-02-03 ENCOUNTER — Other Ambulatory Visit: Payer: Self-pay

## 2023-02-03 DIAGNOSIS — I739 Peripheral vascular disease, unspecified: Secondary | ICD-10-CM

## 2023-02-06 ENCOUNTER — Ambulatory Visit (INDEPENDENT_AMBULATORY_CARE_PROVIDER_SITE_OTHER): Payer: 59 | Admitting: Podiatry

## 2023-02-06 ENCOUNTER — Ambulatory Visit (INDEPENDENT_AMBULATORY_CARE_PROVIDER_SITE_OTHER): Payer: 59

## 2023-02-06 ENCOUNTER — Encounter: Payer: Self-pay | Admitting: Podiatry

## 2023-02-06 DIAGNOSIS — B351 Tinea unguium: Secondary | ICD-10-CM | POA: Diagnosis not present

## 2023-02-06 DIAGNOSIS — L97912 Non-pressure chronic ulcer of unspecified part of right lower leg with fat layer exposed: Secondary | ICD-10-CM

## 2023-02-06 DIAGNOSIS — M778 Other enthesopathies, not elsewhere classified: Secondary | ICD-10-CM | POA: Diagnosis not present

## 2023-02-06 DIAGNOSIS — Z7901 Long term (current) use of anticoagulants: Secondary | ICD-10-CM

## 2023-02-06 DIAGNOSIS — L97511 Non-pressure chronic ulcer of other part of right foot limited to breakdown of skin: Secondary | ICD-10-CM | POA: Diagnosis not present

## 2023-02-08 NOTE — Progress Notes (Signed)
Subjective:   Patient ID: Corey [name], male   DOB: 52 y.o.   MRN: [id number]   HPI Chief Complaint  Patient presents with   Nail Problem    Right foot pain toe nail issues as well.    52 year old male presents the office for the above concerns.  He recently just underwent left above-knee amputation.  Patient has very thick, long nails on the right foot and he could not and that often they are trimmed.  He also recently states he burned his leg on a heater.  He has been keeping a bandage on this.  Denies any drainage or pus.  No open wounds to his feet that he reports otherwise.  No fevers or chills.  Patient is blind.   Review of Systems  All other systems reviewed and are negative.  Past Medical History:  Diagnosis Date   Arthritis    hands and shoulders   Blindness and low vision    "Stargardt disease"   Dissection of aorta (HCC) 2004   a. s/p extensive repeair in 2004 in [place] complicated by ESRD, lower extremity paralysis, coma, and extended hospitalization of 2 years   ESRD (end stage renal disease) (HCC)    a. TTS   Headache    History of cardioversion 2014   Hypertension    Neuropathy    Non-healing non-surgical wound 03/2016   PAF (paroxysmal atrial fibrillation) (HCC)    a. s/p DCCV in 2014; b. on Coumadin; c. CHADS2VASc => 2 (HTN, vascular disease)   Paralysis (HCC)    due to dissection of aorta in 2004, lower extremities   Pneumonia     Past Surgical History:  Procedure Laterality Date   AMPUTATION Left 11/17/2022   Procedure: AMPUTATION ABOVE KNEE;  Surgeon: Nada Libman, MD;  Location: [facility] OR;  Service: Vascular;  Laterality: Left;   APPLICATION OF WOUND VAC Left 04/13/2016   Procedure: APPLICATION OF WOUND VAC;  Surgeon: Fransisco Hertz, MD;  Location: [facility] OR;  Service: Vascular;  Laterality: Left;   APPLICATION OF WOUND VAC Left 04/18/2016   Procedure: APPLICATION OF WOUND VAC;  Surgeon: Maeola Harman, MD;  Location: [facility] OR;  Service: Vascular;   Laterality: Left;  Wound vac change    APPLICATION OF WOUND VAC Left 04/20/2016   Procedure: WOUND VAC CHANGE;  Surgeon: Fransisco Hertz, MD;  Location: [facility] OR;  Service: Vascular;  Laterality: Left;   AV FISTULA PLACEMENT     BASCILIC VEIN TRANSPOSITION Left 12/09/2015   Procedure: FIRST STAGE BASILIC VEIN TRANSPOSITION LEFT UPPER ARM;  Surgeon: Fransisco Hertz, MD;  Location: [facility] OR;  Service: Vascular;  Laterality: Left;   BASCILIC VEIN TRANSPOSITION Left 03/09/2016   Procedure: SECOND STAGE BASILIC VEIN TRANSPOSITION WITH REVISION OF ANASTOMOSIS LEFT UPPER ARM;  Surgeon: Fransisco Hertz, MD;  Location: [facility] OR;  Service: Vascular;  Laterality: Left;   CARDIOVERSION     CARDIOVERSION N/A 01/04/2019   Procedure: CARDIOVERSION;  Surgeon: Lewayne Bunting, MD;  Location: [facility] ENDOSCOPY;  Service: Cardiovascular;  Laterality: N/A;   CHEST TUBE INSERTION Left 11/02/2022   Procedure: CHEST TUBE INSERTION;  Surgeon: Lynnell Catalan, MD;  Location: [facility] ENDOSCOPY;  Service: Pulmonary;  Laterality: Left;  Please have both Pigtail (COOK) and chest tube tray available   REPAIR OF ACUTE ASCENDING THORACIC AORTIC DISSECTION     REVISON OF ARTERIOVENOUS FISTULA Left 04/20/2016   Procedure: LIGATION OF BASILIC VEIN TRANSPOSITION;  Surgeon: Fransisco Hertz, MD;  Location:  [facility] OR;  Service: Vascular;  Laterality: Left;   WOUND DEBRIDEMENT Left 04/13/2016   Procedure: DEBRIDEMENT WOUND;  Surgeon: Fransisco Hertz, MD;  Location: [facility] OR;  Service: Vascular;  Laterality: Left;     Current Outpatient Medications:    acetaminophen (TYLENOL) 325 MG tablet, Take 2 tablets (650 mg total) by mouth 4 (four) times daily -  with meals and at bedtime., Disp: , Rfl:    amiodarone (PACERONE) 200 MG tablet, Take 1 tablet (200 mg total) by mouth daily., Disp: 90 tablet, Rfl: 2   apixaban (ELIQUIS) 2.5 MG TABS tablet, Take 1 tablet (2.5 mg total) by mouth 2 (two) times daily., Disp: 60 tablet, Rfl: 5   bacitracin ointment, Apply topically 2 (two) times  daily., Disp: 120 g, Rfl: 0   camphor-menthol (SARNA) lotion, Apply 1 Application topically every 8 (eight) hours as needed for itching., Disp: , Rfl:    cinacalcet (SENSIPAR) 30 MG tablet, Take 1 tablet (30 mg total) by mouth daily with supper., Disp: 30 tablet, Rfl: 0   diclofenac Sodium (VOLTAREN) 1 % GEL, Apply 2 g topically 4 (four) times daily as needed (Arthritis)., Disp: 400 g, Rfl: 1   docusate sodium (COLACE) 100 MG capsule, Take 1 capsule (100 mg total) by mouth daily., Disp: 100 capsule, Rfl: 0   DULoxetine (CYMBALTA) 20 MG capsule, Take 1 capsule (20 mg total) by mouth daily., Disp: 90 capsule, Rfl: 0   gabapentin (NEURONTIN) 300 MG capsule, Take 1 capsule (300 mg total) by mouth at bedtime., Disp: 90 capsule, Rfl: 1   hydrocerin (EUCERIN) CREA, Apply 1 Application topically daily., Disp: , Rfl:    lidocaine (LIDODERM) 5 %, Place 3 patches onto the skin daily. Remove & Discard patch within 12 hours or as directed by MD, Disp: 90 patch, Rfl: 0   melatonin 3 MG TABS tablet, Take 1 tablet (3 mg total) by mouth at bedtime., Disp: 60 tablet, Rfl: 0   methocarbamol (ROBAXIN) 500 MG tablet, Take 1 tablet (500 mg total) by mouth every 6 (six) hours as needed for muscle spasms., Disp: 40 tablet, Rfl: 0   midodrine (PROAMATINE) 5 MG tablet, Take 3 tablets (15 mg total) by mouth 3 (three) times daily with meals., Disp: 810 tablet, Rfl: 2   Multiple Vitamins-Minerals (MULTIVITAMIN WITH MINERALS) tablet, Take 1 tablet by mouth daily., Disp: , Rfl:    oxyCODONE-acetaminophen (PERCOCET) 5-325 MG tablet, Take 1 tablet by mouth every 6 (six) hours as needed for severe pain., Disp: 6 tablet, Rfl: 0   oxymetazoline (AFRIN) 0.05 % nasal spray, Place 1 spray into both nostrils 2 (two) times daily., Disp: 30 mL, Rfl: 0   polyethylene glycol powder (GLYCOLAX/MIRALAX) 17 GM/SCOOP powder, Take 1 capful (17 g) with water by mouth 2 (two) times daily., Disp: 238 g, Rfl: 0   senna-docusate (SENOKOT-S) 8.6-50 MG  tablet, Take 1 tablet by mouth 2 (two) times daily., Disp: 60 tablet, Rfl: 0   sucroferric oxyhydroxide (VELPHORO) 500 MG chewable tablet, Chew 1 tablet (500 mg total) by mouth 3 (three) times daily with meals., Disp: 90 tablet, Rfl: 0   zolpidem (AMBIEN) 5 MG tablet, Take 1 tablet (5 mg total) by mouth at bedtime as needed for sleep (Insomnia)., Disp: 30 tablet, Rfl: 0  Allergies  Allergen Reactions   Ciprofloxacin Other (See Comments)    Aortic dissection   Heparin Other (See Comments)    UNSPECIFIED REACTION :  On Coumadin since 2004   HIT panel negative 01/19/17  Doxercalciferol Other (See Comments)   Tomato    Quinolones Other (See Comments)    unknown           Objective:  Physical Exam  General: AAO x3, NAD  Dermatological: The nails on the right foot are significantly hypertrophic, dystrophic and elongated.  In particular the right hallux toenail is very thick and putting pressure on the second toe and there is significant buildup underneath this.  On the fourth toe the nail is curving underneath putting pressure onto the skin distally. Upon debridement of this with the nail was removed from the skin there was some bleeding.  There is no laceration from the debridement itself but it was from where the nail was very long and curving in to the skin. There is an ulceration noted along the medial leg as pictured below. No drainage or purulence.     Vascular: Unable to palpate pulse on right.   Neruologic: Decreased  Musculoskeletal: In wheelchair, left AKA.     Assessment:   Significant onychomycosis/onychodystrophy with ulceration right fourth toe, medial leg    Plan:  -Treatment options discussed including all alternatives, risks, and complications -Etiology of symptoms were discussed -X-rays obtained reviewed of the right foot.  2 views obtained.  Significant vessel calcification.  Osteopenia present.  Chronic changes likely to the digits.  -Sharply debrided the  nails without any complication x 5 on the right foot.  There is significantly thickened.  Upon debridement minimal bleeding occurred to the fourth toe from where the nail was curving deep into the skin.  Antibiotic ointment is applied followed by dressing.  Recommend daily dressing changes.  Recommend daily dressing changes with antibiotic ointment for now.  Referral to the wound care center. -Follow-up with vascular surgery  -Monitor for any clinical signs or symptoms of infection and directed to call the office immediately should any occur or go to the ER.  Vivi Barrack DPM

## 2023-02-10 ENCOUNTER — Ambulatory Visit (INDEPENDENT_AMBULATORY_CARE_PROVIDER_SITE_OTHER): Payer: Self-pay | Admitting: *Deleted

## 2023-02-10 NOTE — Telephone Encounter (Signed)
  Chief Complaint: Pt. Asking if Gwinda Passe, NP would send/refill rx for Tramadol and Ambien. Symptoms: Having nerve pain where he had his leg surgically amputated.    Having trouble sleeping.   Has taken the Ambien and would like to have it refilled. Frequency: N/A Pertinent Negatives: Patient denies N/A Disposition: [] ED /[] Urgent Care (no appt availability in office) / [] Appointment(In office/virtual)/ []  [place] [facility]/ [] Home Care/ [] Refused Recommended Disposition /[] [facility]/ [x]  Follow-up with PCP Additional Notes: Message sent to Gwinda Passe, NP

## 2023-02-10 NOTE — Telephone Encounter (Signed)
Message from [place] C sent at 02/10/2023 11:35 AM EST  Summary: rx req   The patient shares that they were recently seen in the hospital and prescribed Rx #: [id number] traMADol (ULTRAM) 50 MG tablet [[id number]] the patient would like to continue taking the medication for their discomfort  Please contact the patient further when possible to discuss a refill          Call History  Contact Date/Time Type Contact Phone/Fax User  02/10/2023 11:35 AM EST Phone (Incoming) Azlan, Augusto (Self) [phone] Judie Petit) Izora Ribas, Everette A   Reason for Disposition  [1] Caller has URGENT medicine question about med that PCP or specialist prescribed AND [2] triager unable to answer question    Requesting refill of the Tramadol 50 mg and Ambien.  Both non delegated refills.  Answer Assessment - Initial Assessment Questions 1. NAME of MEDICINE: "What medicine(s) are you calling about?"     Tramadol Ambien  2. QUESTION: "What is your question?" (e.g., double dose of medicine, side effect)     I would like a refill of the Tramadol.   It really helps with my pain.   I had pneumonia and was in the hospital.   I had a chest tube.   They found out I had a sore on my left heel and I ended up with an amputation of my left leg.   The nerves are what's hurting and causing the discomfort.    I have pain in my lower back and right leg too.   The Tramadol they gave me in the hospital really helped.  Would Marcelino Duster be willing to call that in for me?  I'm also having trouble sleeping.   Can she refill the Ambien too?   He asked about melatonin and I let him know he could purchase that OTC.   He thanked me for telling him that.   "I didn't know that".    Message sent to Gwinda Passe, NP.   3. PRESCRIBER: "Who prescribed the medicine?" Reason: if prescribed by specialist, call should be referred to that group.     Gwinda Passe, NP  4. SYMPTOMS: "Do you have any symptoms?" If Yes, ask: "What symptoms are you  having?"  "How bad are the symptoms (e.g., mild, moderate, severe)     See above     5. PREGNANCY:  "Is there any chance that you are pregnant?" "When was your last menstrual period?"     N/A  Protocols used: Medication Question Call-A-AH

## 2023-02-10 NOTE — Telephone Encounter (Signed)
Provider doesn't prescribed Ambien and or Tramadol. Will forward to provider

## 2023-02-14 ENCOUNTER — Encounter (INDEPENDENT_AMBULATORY_CARE_PROVIDER_SITE_OTHER): Payer: Self-pay | Admitting: Primary Care

## 2023-02-20 ENCOUNTER — Other Ambulatory Visit: Payer: Self-pay

## 2023-02-20 DIAGNOSIS — I482 Chronic atrial fibrillation, unspecified: Secondary | ICD-10-CM

## 2023-02-20 MED ORDER — APIXABAN 2.5 MG PO TABS: 2.5000 mg | ORAL_TABLET | Freq: Two times a day (BID) | ORAL | 5 refills | Status: DC

## 2023-02-20 NOTE — Telephone Encounter (Signed)
Prescription refill request for Eliquis received. Indication: Afib  Last office visit: 08/31/22 Anne Fu)  Scr: 6.01 (12/10/22)  Age: 52 Weight: 59kg  Appropriate dose. Refill sent.

## 2023-02-21 ENCOUNTER — Other Ambulatory Visit (INDEPENDENT_AMBULATORY_CARE_PROVIDER_SITE_OTHER): Payer: Self-pay | Admitting: Primary Care

## 2023-02-21 DIAGNOSIS — G629 Polyneuropathy, unspecified: Secondary | ICD-10-CM

## 2023-02-21 NOTE — Telephone Encounter (Signed)
Patient called and advised tramadol is not on his current list but percocet is. He says percocet is too strong and he would prefer tramadol. Advised I will send this request to [facility] for ordering.

## 2023-02-21 NOTE — Telephone Encounter (Signed)
Medication Refill -  Most Recent Primary Care Visit:  Provider: [name]  Department: [facility]  Visit Type: OFFICE VISIT  Date: 01/20/2023  Medication: midodrine (PROAMATINE) 5 MG tablet [[id number]]  methocarbamol (ROBAXIN) 500 MG tablet [[id number]  melatonin tablet 3 mg   [[id number]]midodrine (PROAMATINE) 5 MG tablet [[id number]]  traMADol (ULTRAM) 50 MG tablet [[id number]]   gabapentin (NEURONTIN) 300 MG capsule [[id number]]  diclofenac Sodium (VOLTAREN) 1 % GEL [[id number]]     Has the patient contacted their pharmacy? No (  Is this the correct pharmacy for this prescription? Yes If no, delete pharmacy and type the correct one.  This is the patient's preferred pharmacy:  [facility] [address] [place], [place] - [address] [place] [address] Phone: [phone] Fax: [phone]   Has the prescription been filled recently? Yes  Is the patient out of the medication? Yes  Has the patient been seen for an appointment in the last year OR does the patient have an upcoming appointment? Yes  Can we respond through MyChart? Yes  Agent: Please be advised that Rx refills may take up to 3 business days. We ask that you follow-up with your pharmacy.

## 2023-02-21 NOTE — Telephone Encounter (Signed)
[facility] called and spoke to [place], Pensions consultant about the refill(s) gabapentin and diclofenac gel requested. Advised both were sent on 01/20/23 with 1 refill. She says the diclofenac is now an OTC medication, so it cannot be filled and she will let the patient know when he comes in for a pickup; gabapentin is too early to refill, earliest is 03/30/23.

## 2023-02-22 ENCOUNTER — Telehealth (INDEPENDENT_AMBULATORY_CARE_PROVIDER_SITE_OTHER): Payer: Self-pay

## 2023-02-22 NOTE — Telephone Encounter (Signed)
Will forward to provider  

## 2023-02-22 NOTE — Telephone Encounter (Signed)
Office has made pt aware that provider doesn't prescribe Ambien or Tramadol.

## 2023-02-22 NOTE — Telephone Encounter (Signed)
Patient called back again stated he feels like no one cares about how he feel or his health. He just needs someone from the practice to cal him back in reference to refills with his Ambien and Tramadol. Please f/u with patient

## 2023-02-22 NOTE — Telephone Encounter (Signed)
Copied from CRM [id number]. Topic: General - Other >> Feb 22, 2023 11:02 AM Eve[place] C wrote: Reason for CRM: The patient would like to speak directly with a member of practice staff when possible regarding their previously requested refills of Ambien and/or tramadol   Please contact the patient further when possible  The urgency of their request for contact has been stressed by the patient

## 2023-02-22 NOTE — Telephone Encounter (Signed)
Requested medication (s) are due for refill today: Yes  Requested medication (s) are on the active medication list: Yes (tramadol is not on current list)  Last refill:  12/13/22 and 01/20/23  Future visit scheduled: No  Notes to clinic:  Unable to refill per protocol, last refill by another provider.      Requested Prescriptions  Pending Prescriptions Disp Refills   midodrine (PROAMATINE) 5 MG tablet 810 tablet 2    Sig: Take 3 tablets (15 mg total) by mouth 3 (three) times daily with meals.     Not Delegated - Cardiovascular: Midodrine Failed - 02/21/2023 11:03 AM      Failed - This refill cannot be delegated      Failed - Cr in normal range and within 360 days    Creatinine, Ser  Date Value Ref Range Status  12/10/2022 6.01 (H) 0.61 - 1.24 mg/dL Final         Failed - AST in normal range and within 360 days    AST  Date Value Ref Range Status  11/02/2022 7 (L) 15 - 41 U/L Final         Failed - Last BP in normal range    BP Readings from Last 1 Encounters:  01/20/23 (!) 143/81         Passed - ALT in normal range and within 360 days    ALT  Date Value Ref Range Status  11/02/2022 5 0 - 44 U/L Final         Passed - Valid encounter within last 12 months    Recent Outpatient Visits           1 month ago Peripheral polyneuropathy   [place] [facility] [name], NP   4 months ago Physical deconditioning   [place] [facility] [name], NP   1 year ago Paralysis [facility])   [place] [facility] [name], NP   1 year ago Colon cancer screening   [place] [facility] [name], NP   5 years ago Dissection of aorta, unspecified portion of aorta [facility])   [place] [facility] Loletta Specter, PA-C       Future Appointments             In 3 weeks Maxwell Caul, MD [facility] - A Dept Of [place]. [facility], [facility]             methocarbamol (ROBAXIN) 500 MG tablet 40 tablet 0    Sig: Take 1 tablet (500 mg total) by mouth every 6 (six) hours as needed for muscle spasms.     Not Delegated - Analgesics:  Muscle Relaxants Failed - 02/21/2023 11:03 AM      Failed - This refill cannot be delegated      Passed - Valid encounter within last 6 months    Recent Outpatient Visits           1 month ago Peripheral polyneuropathy   [place] [facility] [name], NP   4 months ago Physical deconditioning   [place] [facility] [name], NP   1 year ago Paralysis [facility])   [place] [facility] [name], NP   1 year ago Colon cancer screening   [employer] [facility] [name], NP   5 years ago Dissection  of aorta, unspecified portion of aorta [facility])   [place] [facility] Loletta Specter, PA-C       Future Appointments             In 3 weeks Maxwell Caul, MD [facility] - A Dept Of [place]. [facility], [facility]             melatonin 3 MG TABS tablet 60 tablet 0    Sig: Take 1 tablet (3 mg total) by mouth at bedtime.     Over the Counter:  OTC Passed - 02/21/2023 11:03 AM      Passed - Valid encounter within last 12 months    Recent Outpatient Visits           1 month ago Peripheral polyneuropathy   [place] [facility] [name], NP   4 months ago Physical deconditioning   [place] [facility] [name], NP   1 year ago Paralysis [facility])   [place] [facility] [name], NP   1 year ago Colon cancer screening   [place] [facility] [name], NP   5 years ago Dissection of aorta, unspecified portion of aorta [facility])   [place] [facility] Loletta Specter, PA-C       Future  Appointments             In 3 weeks Maxwell Caul, MD [facility] - A Dept Of [place]. [facility], [facility]             traMADol (ULTRAM) 50 MG tablet 28 tablet 0    Sig: Take 1 tablet (50 mg total) by mouth 4 (four) times daily as needed for severe pain (pain score 7-10).     Not Delegated - Analgesics:  Opioid Agonists Failed - 02/21/2023 11:03 AM      Failed - This refill cannot be delegated      Failed - Urine Drug Screen completed in last 360 days      Passed - Valid encounter within last 3 months    Recent Outpatient Visits           1 month ago Peripheral polyneuropathy   [place] [facility] [name], NP   4 months ago Physical deconditioning   [place] [facility] [name], NP   1 year ago Paralysis [facility])   [place] [facility] [name], NP   1 year ago Colon cancer screening   [place] [facility] [name], NP   5 years ago Dissection of aorta, unspecified portion of aorta [facility])   [place] [facility] Loletta Specter, PA-C       Future Appointments             In 3 weeks Maxwell Caul, MD [facility] - A Dept Of [place]. [employer], [facility]

## 2023-02-24 ENCOUNTER — Telehealth: Payer: Self-pay | Admitting: Primary Care

## 2023-02-24 NOTE — Telephone Encounter (Signed)
Returned call and lvm giving verbal orders

## 2023-02-24 NOTE — Telephone Encounter (Signed)
Copied from CRM [id number]. Topic: Quick Communication - Home Health Verbal Orders >> Feb 22, 2023 11:19 AM Clide Dales wrote: Caller/Agency: [facility] Callback Number: [phone] Service Requested: Physical Therapy Frequency: Requesting a nursing evaluation for wound care Any new concerns about the patient? Yes

## 2023-02-28 ENCOUNTER — Telehealth: Payer: Self-pay | Admitting: *Deleted

## 2023-02-28 NOTE — Telephone Encounter (Signed)
Pt called concern of  amputation incision came open. Has a little drainage but not warm to touch .Pt has a appointment for incision check  03/06/23 @ 9:30 am with PA. Pt informed to check site clean and covered and to  called office for further questions or concerns.

## 2023-03-06 ENCOUNTER — Encounter: Payer: 59 | Attending: Physical Medicine and Rehabilitation | Admitting: Physical Medicine and Rehabilitation

## 2023-03-06 ENCOUNTER — Ambulatory Visit (INDEPENDENT_AMBULATORY_CARE_PROVIDER_SITE_OTHER): Payer: 59 | Admitting: Physician Assistant

## 2023-03-06 VITALS — BP 143/85 | HR 65 | Temp 98.7°F | Ht 75.0 in | Wt 130.0 lb

## 2023-03-06 DIAGNOSIS — S78112A Complete traumatic amputation at level between left hip and knee, initial encounter: Secondary | ICD-10-CM | POA: Diagnosis not present

## 2023-03-06 NOTE — Progress Notes (Signed)
HISTORY AND PHYSICAL     CC:  follow up. Requesting Provider:  [name], NP  HPI: This is a 52 y.o. male who is here today for follow up.  He was seen by Dr. Edilia Bo in consult on 10/30/2022 for PAD and chronic venous insufficiency. He felt he most likely had chronic infrainguinal arterial occlusive disease. Pt is non ambulatory and confined to wheelchair. At that time, he was not having significant sx. At that time, he did not feel arteriography was indicated as it would not change his overall clinical picture. His ABI's were non compressible. Dr. Edilia Bo discussed bilateral amputations at the time if his legs worsened. He was not interested at the time. He also has chronic venous insufficiency and hyperkeratosis of the skin of both legs. Given his PAD, he did not feel pt would be able to tolerate leg elevation or significant compression. He tried to remove some necrotic skin but pt did not tolerate this. WOC was consulted and Eucerin to legs daily was recommended.   He has hx of ESRD with hx of left 1st stage BVT 12/09/2015 and 2nd stage on 03/09/2016 by Dr. Imogene Burn.  On 23-Apr-2016, he underwent I&D of dead skin of the LUA and vac placement by Dr. Imogene Burn.  On 04/18/2016, he underwent exam under anesthesia of the left arm and wound vac placement of the negative pressure dressing by Dr. Randie Heinz and Dr. Leta Baptist.  On 04/20/2016, he had ligation of left BVT   He has hx of type B aortic dissection and this was unchanged on CTA on 11/08/2022.   He was seen in consult again in the hospital on 11/14/2022 and at that time, he had a new left heel ulceration and ultimately underwent left AKA on 11/17/2022 by Dr. Myra Gianotti.    Pt was last seen 01/09/2023 and at that time, he was still having some sharp shooting pains as well as phantom pains.  He was not having any drainage from his incision.  He did have a dry eschar along most of the incision and he was instructed to clean with soap and water daily and  continue dressing changes.  He was also referred to the pain clinic for management of his ongoing phantom pain.    He did see podiatry on 02/06/2023 and had a burn on the right leg and this did heal.  He states he also had a wound between his toes on the left and this also healed.    The pt returns today for follow up.  He states that he has a wound on his left AKA stump.  He is blind and unable to see the wound.  He does not recall injuring this area.  He states that it is painful.  He has not had any fever or chills.  He states that he lives at home with his brother who helps care for him.  He states HHRN came out on Saturday and cleansed the wound and dressed it.  He states that he was started on Doxycycline by her.  He states he has had a wound vac in the past on his left arm when he had issues with a wound there.   The pt is not on a statin for cholesterol management.    The pt is not on an aspirin.    Other AC:  Eliquis for afib The pt is not on medication for hypertension.  The pt is not on medication for diabetes. Tobacco hx:  never  Past Medical History:  Diagnosis Date   Arthritis    hands and shoulders   Blindness and low vision    "Stargardt disease"   Dissection of aorta (HCC) 2004   a. s/p extensive repeair in 2004 in [place] complicated by ESRD, lower extremity paralysis, coma, and extended hospitalization of 2 years   ESRD (end stage renal disease) (HCC)    a. TTS   Headache    History of cardioversion 2014   Hypertension    Neuropathy    Non-healing non-surgical wound 03/2016   PAF (paroxysmal atrial fibrillation) (HCC)    a. s/p DCCV in 2014; b. on Coumadin; c. CHADS2VASc => 2 (HTN, vascular disease)   Paralysis (HCC)    due to dissection of aorta in 2004, lower extremities   Pneumonia     Past Surgical History:  Procedure Laterality Date   AMPUTATION Left 11/17/2022   Procedure: AMPUTATION ABOVE KNEE;  Surgeon: Nada Libman, MD;  Location: [facility] OR;  Service:  Vascular;  Laterality: Left;   APPLICATION OF WOUND VAC Left 04/13/2016   Procedure: APPLICATION OF WOUND VAC;  Surgeon: Fransisco Hertz, MD;  Location: [facility] OR;  Service: Vascular;  Laterality: Left;   APPLICATION OF WOUND VAC Left 04/18/2016   Procedure: APPLICATION OF WOUND VAC;  Surgeon: Maeola Harman, MD;  Location: [facility] OR;  Service: Vascular;  Laterality: Left;  Wound vac change    APPLICATION OF WOUND VAC Left 04/20/2016   Procedure: WOUND VAC CHANGE;  Surgeon: Fransisco Hertz, MD;  Location: [facility] OR;  Service: Vascular;  Laterality: Left;   AV FISTULA PLACEMENT     BASCILIC VEIN TRANSPOSITION Left 12/09/2015   Procedure: FIRST STAGE BASILIC VEIN TRANSPOSITION LEFT UPPER ARM;  Surgeon: Fransisco Hertz, MD;  Location: [facility] OR;  Service: Vascular;  Laterality: Left;   BASCILIC VEIN TRANSPOSITION Left 03/09/2016   Procedure: SECOND STAGE BASILIC VEIN TRANSPOSITION WITH REVISION OF ANASTOMOSIS LEFT UPPER ARM;  Surgeon: Fransisco Hertz, MD;  Location: [facility] OR;  Service: Vascular;  Laterality: Left;   CARDIOVERSION     CARDIOVERSION N/A 01/04/2019   Procedure: CARDIOVERSION;  Surgeon: Lewayne Bunting, MD;  Location: [facility] ENDOSCOPY;  Service: Cardiovascular;  Laterality: N/A;   CHEST TUBE INSERTION Left 11/02/2022   Procedure: CHEST TUBE INSERTION;  Surgeon: Lynnell Catalan, MD;  Location: [facility] ENDOSCOPY;  Service: Pulmonary;  Laterality: Left;  Please have both Pigtail (COOK) and chest tube tray available   REPAIR OF ACUTE ASCENDING THORACIC AORTIC DISSECTION     REVISON OF ARTERIOVENOUS FISTULA Left 04/20/2016   Procedure: LIGATION OF BASILIC VEIN TRANSPOSITION;  Surgeon: Fransisco Hertz, MD;  Location: [facility] OR;  Service: Vascular;  Laterality: Left;   WOUND DEBRIDEMENT Left 04/13/2016   Procedure: DEBRIDEMENT WOUND;  Surgeon: Fransisco Hertz, MD;  Location: [facility] OR;  Service: Vascular;  Laterality: Left;    Allergies  Allergen Reactions   Ciprofloxacin Other (See Comments)    Aortic dissection   Heparin Other (See  Comments)    UNSPECIFIED REACTION :  On Coumadin since 2004   HIT panel negative 01/19/17   Doxercalciferol Other (See Comments)   Tomato    Quinolones Other (See Comments)    unknown    Current Outpatient Medications  Medication Sig Dispense Refill   acetaminophen (TYLENOL) 325 MG tablet Take 2 tablets (650 mg total) by mouth 4 (four) times daily -  with meals and at bedtime.     amiodarone (PACERONE) 200 MG tablet Take  1 tablet (200 mg total) by mouth daily. 90 tablet 2   apixaban (ELIQUIS) 2.5 MG TABS tablet Take 1 tablet (2.5 mg total) by mouth 2 (two) times daily. 60 tablet 5   bacitracin ointment Apply topically 2 (two) times daily. 120 g 0   camphor-menthol (SARNA) lotion Apply 1 Application topically every 8 (eight) hours as needed for itching.     cinacalcet (SENSIPAR) 30 MG tablet Take 1 tablet (30 mg total) by mouth daily with supper. 30 tablet 0   diclofenac Sodium (VOLTAREN) 1 % GEL Apply 2 g topically 4 (four) times daily as needed (Arthritis). 400 g 1   docusate sodium (COLACE) 100 MG capsule Take 1 capsule (100 mg total) by mouth daily. 100 capsule 0   DULoxetine (CYMBALTA) 20 MG capsule Take 1 capsule (20 mg total) by mouth daily. 90 capsule 0   gabapentin (NEURONTIN) 300 MG capsule Take 1 capsule (300 mg total) by mouth at bedtime. 90 capsule 1   hydrocerin (EUCERIN) CREA Apply 1 Application topically daily.     lidocaine (LIDODERM) 5 % Place 3 patches onto the skin daily. Remove & Discard patch within 12 hours or as directed by MD 90 patch 0   melatonin 3 MG TABS tablet Take 1 tablet (3 mg total) by mouth at bedtime. 60 tablet 0   methocarbamol (ROBAXIN) 500 MG tablet Take 1 tablet (500 mg total) by mouth every 6 (six) hours as needed for muscle spasms. 40 tablet 0   midodrine (PROAMATINE) 5 MG tablet Take 3 tablets (15 mg total) by mouth 3 (three) times daily with meals. 810 tablet 2   Multiple Vitamins-Minerals (MULTIVITAMIN WITH MINERALS) tablet Take 1 tablet by  mouth daily.     oxyCODONE-acetaminophen (PERCOCET) 5-325 MG tablet Take 1 tablet by mouth every 6 (six) hours as needed for severe pain. 6 tablet 0   oxymetazoline (AFRIN) 0.05 % nasal spray Place 1 spray into both nostrils 2 (two) times daily. 30 mL 0   polyethylene glycol powder (GLYCOLAX/MIRALAX) 17 GM/SCOOP powder Take 1 capful (17 g) with water by mouth 2 (two) times daily. 238 g 0   senna-docusate (SENOKOT-S) 8.6-50 MG tablet Take 1 tablet by mouth 2 (two) times daily. 60 tablet 0   sucroferric oxyhydroxide (VELPHORO) 500 MG chewable tablet Chew 1 tablet (500 mg total) by mouth 3 (three) times daily with meals. 90 tablet 0   zolpidem (AMBIEN) 5 MG tablet Take 1 tablet (5 mg total) by mouth at bedtime as needed for sleep (Insomnia). 30 tablet 0   No current facility-administered medications for this visit.    Family History  Problem Relation Age of Onset   Cancer Mother    Hypertension Mother    Cancer Father    Hypertension Father    Thyroid disease Sister    Stroke Brother        48    Social History   Socioeconomic History   Marital status: Divorced    Spouse name: Not on file   Number of children: 2   Years of education: Not on file   Highest education level: Not on file  Occupational History   Occupation: DIABLED  Tobacco Use   Smoking status: Never   Smokeless tobacco: Never  Vaping Use   Vaping status: Never Used  Substance and Sexual Activity   Alcohol use: No   Drug use: No   Sexual activity: Not on file  Other Topics Concern   Not on file  Social  History Narrative   Not on file   Social Determinants of Health   Financial Resource Strain: Medium Risk (09/26/2022)   Overall Financial Resource Strain (CARDIA)    Difficulty of Paying Living Expenses: Somewhat hard  Food Insecurity: No Food Insecurity (10/27/2022)   Hunger Vital Sign    Worried About Running Out of Food in the Last Year: Never true    Ran Out of Food in the Last Year: Never true   Transportation Needs: No Transportation Needs (10/27/2022)   PRAPARE - Administrator, Civil Service (Medical): No    Lack of Transportation (Non-Medical): No  Physical Activity: Inactive (09/26/2022)   Exercise Vital Sign    Days of Exercise per Week: 0 days    Minutes of Exercise per Session: 0 min  Stress: No Stress Concern Present (09/26/2022)   [employer] of Occupational Health - Occupational Stress Questionnaire    Feeling of Stress : Not at all  Social Connections: Socially Isolated (09/26/2022)   Social Connection and Isolation Panel [NHANES]    Frequency of Communication with Friends and Family: More than three times a week    Frequency of Social Gatherings with Friends and Family: Twice a week    Attends Religious Services: Never    Database administrator or Organizations: No    Attends Banker Meetings: Never    Marital Status: Divorced  Catering manager Violence: Not At Risk (10/27/2022)   Humiliation, Afraid, Rape, and Kick questionnaire    Fear of Current or Ex-Partner: No    Emotionally Abused: No    Physically Abused: No    Sexually Abused: No     REVIEW OF SYSTEMS:   [X]  denotes positive finding, [ ]  denotes negative finding Cardiac  Comments:  Chest pain or chest pressure:    Shortness of breath upon exertion:    Short of breath when lying flat:    Irregular heart rhythm:        Vascular    Pain in calf, thigh, or hip brought on by ambulation:    Pain in feet at night that wakes you up from your sleep:     Blood clot in your veins:    Leg swelling:         Pulmonary    Oxygen at home:    Productive cough:     Wheezing:         Neurologic    Sudden weakness in arms or legs:     Sudden numbness in arms or legs:     Sudden onset of difficulty speaking or slurred speech:    Temporary loss of vision in one eye:     Problems with dizziness:         Gastrointestinal    Blood in stool:     Vomited blood:          Genitourinary    Burning when urinating:     Blood in urine:        Psychiatric    Major depression:         Hematologic    Bleeding problems:    Problems with blood clotting too easily:        Skin    Rashes or ulcers: x       Constitutional    Fever or chills:      PHYSICAL EXAMINATION:  Today's Vitals   03/06/23 0845  BP: (!) 143/85  Pulse: 65  Temp: 98.7 F (37.1  C)  TempSrc: Temporal  SpO2: 93%  Weight: 130 lb (59 kg)  Height: 6\' 3"  (1.905 m)  PainSc: 8    Body mass index is 16.25 kg/m.   General:  WDWN in NAD; vital signs documented above Gait: Not observed HENT: WNL, normocephalic Pulmonary: normal non-labored breathing , without wheezing Cardiac: regular HR Extremities: burn on right leg has healed.  Left AKA with new wound with fibrinous exudate.  Wound probed and does not probe deep.  Bone is not visualized.       Musculoskeletal: no muscle wasting or atrophy  Neurologic: A&O X 3 Psychiatric:  The pt has Normal affect.   Non-Invasive Vascular Imaging:   Previous ABI's/TBI's on 10/31/2022: Right:  [place]  Left:  [place]     ASSESSMENT/PLAN:: 52 y.o. male here for follow up for PAD with hx ESRD with PAD and non compressible vessels with hx of left AKA on 11/17/2022 who presents today for new wound on the left AKA stump   -wound probed on left AKA site-did not probe deep.  The bone is not visualized.  There is no purulence but there is fibrinous exudate that was debrided today.  Wound relatively clean and pt has been afebrile.  Pt does have HHRN coming to the house and he has been started on Doxycycline.  Discussed doing wet to dry dressings to the left AKA site daily for a couple of weeks and he will return for wound check in a couple of weeks when MD is in the office.  We discussed that if this does not improve, he may need debridement in the OR or revision with possible vac placement.  He is familiar with vac given he had this on his left arm in 2018.    He will call sooner if there are any issues.   -he was given supplies for wet to dry dressing changes. -percocet 5/325 one po q6h prn pain #15 no refill given since he has wound on his stump.   Doreatha Massed, [facility] [phone]  Clinic MD:   Karin Lieu on call MD

## 2023-03-07 MED ORDER — OXYCODONE-ACETAMINOPHEN 5-325 MG PO TABS: 1.0000 | ORAL_TABLET | Freq: Four times a day (QID) | ORAL | 0 refills | Status: DC | PRN

## 2023-03-07 NOTE — Addendum Note (Signed)
Addended by: Dara Lords on: 03/07/2023 09:59 AM   Modules accepted: Orders

## 2023-03-10 ENCOUNTER — Telehealth (INDEPENDENT_AMBULATORY_CARE_PROVIDER_SITE_OTHER): Payer: Self-pay | Admitting: Primary Care

## 2023-03-14 ENCOUNTER — Telehealth: Payer: Self-pay

## 2023-03-14 NOTE — Telephone Encounter (Signed)
Tammy with [facility] called to let us know pt's wound is worsening. She is unsure if he is on Doxycycline, as he stated he was at his last office visit here. Wound worsening and now has odor. Pt's wound check has been moved up to this week. Pt is aware.

## 2023-03-17 ENCOUNTER — Ambulatory Visit: Payer: 59 | Admitting: Physician Assistant

## 2023-03-17 VITALS — BP 112/71 | HR 67 | Temp 99.2°F | Resp 22 | Ht 75.0 in | Wt 140.8 lb

## 2023-03-17 DIAGNOSIS — T8189XS Other complications of procedures, not elsewhere classified, sequela: Secondary | ICD-10-CM | POA: Diagnosis not present

## 2023-03-17 DIAGNOSIS — S78112A Complete traumatic amputation at level between left hip and knee, initial encounter: Secondary | ICD-10-CM

## 2023-03-17 NOTE — Progress Notes (Signed)
POST OPERATIVE OFFICE NOTE    CC:  F/u for surgery  HPI:  This is a 52 y.o. male who is here for wound check. HH called with concerns of worsening wound and malodor. He is s/p left AKA on 11/17/22 by Dr. Myra Gianotti for left heel ulceration with known history of infrainguinal arterial occlusive disease with no revascularization options.Patient is non ambulatory and confined to wheel chair prior to amputation.   He was last seen on 03/06/23 for follow up.  He had wound present on left AKA. He is blind and unable to see the wound. HHRN had been coming out Saturdays to clean the wound and dress it. He was started on Doxycycline. The wound was probed in office and it did not tunnel. No bone visualized. Wet to dry dressing changes were recommended with close interval follow up.   Today he is here with his brother. He reports continued pain in left AKA. He is not really able to tell me how often the dressings are changed but he says [facility] RN comes out every other week. He says they are working to get them out 2x/ week. He is unsure about drainage. He denies any fever or chills. He is still taking his Doxycycline. According to his brother he has about 2 weeks left.  He is scheduled to go to the wound care center on Monday 12/16 for evaluation of his right leg and possible hyperbaric therapy.   The pt is not on a statin for cholesterol management.    The pt is not on an aspirin.    Other AC:  Eliquis for afib The pt is not on medication for hypertension.  The pt is not on medication for diabetes. Tobacco hx:  never  Allergies  Allergen Reactions   Ciprofloxacin Other (See Comments)    Aortic dissection   Heparin Other (See Comments)    UNSPECIFIED REACTION :  On Coumadin since 2004   HIT panel negative 01/19/17   Doxercalciferol Other (See Comments)   Tomato    Quinolones Other (See Comments)    unknown    Current Outpatient Medications  Medication Sig Dispense Refill   acetaminophen (TYLENOL)  325 MG tablet Take 2 tablets (650 mg total) by mouth 4 (four) times daily -  with meals and at bedtime.     amiodarone (PACERONE) 200 MG tablet Take 1 tablet (200 mg total) by mouth daily. 90 tablet 2   apixaban (ELIQUIS) 2.5 MG TABS tablet Take 1 tablet (2.5 mg total) by mouth 2 (two) times daily. 60 tablet 5   bacitracin ointment Apply topically 2 (two) times daily. 120 g 0   camphor-menthol (SARNA) lotion Apply 1 Application topically every 8 (eight) hours as needed for itching.     cinacalcet (SENSIPAR) 30 MG tablet Take 1 tablet (30 mg total) by mouth daily with supper. 30 tablet 0   diclofenac Sodium (VOLTAREN) 1 % GEL Apply 2 g topically 4 (four) times daily as needed (Arthritis). 400 g 1   docusate sodium (COLACE) 100 MG capsule Take 1 capsule (100 mg total) by mouth daily. 100 capsule 0   DULoxetine (CYMBALTA) 20 MG capsule Take 1 capsule (20 mg total) by mouth daily. 90 capsule 0   gabapentin (NEURONTIN) 300 MG capsule Take 1 capsule (300 mg total) by mouth at bedtime. 90 capsule 1   hydrocerin (EUCERIN) CREA Apply 1 Application topically daily.     lidocaine (LIDODERM) 5 % Place 3 patches onto the skin daily. Remove &  Discard patch within 12 hours or as directed by MD 90 patch 0   melatonin 3 MG TABS tablet Take 1 tablet (3 mg total) by mouth at bedtime. 60 tablet 0   methocarbamol (ROBAXIN) 500 MG tablet Take 1 tablet (500 mg total) by mouth every 6 (six) hours as needed for muscle spasms. 40 tablet 0   midodrine (PROAMATINE) 5 MG tablet Take 3 tablets (15 mg total) by mouth 3 (three) times daily with meals. 810 tablet 2   Multiple Vitamins-Minerals (MULTIVITAMIN WITH MINERALS) tablet Take 1 tablet by mouth daily.     oxyCODONE-acetaminophen (PERCOCET) 5-325 MG tablet Take 1 tablet by mouth every 6 (six) hours as needed for severe pain (pain score 7-10). 15 tablet 0   oxymetazoline (AFRIN) 0.05 % nasal spray Place 1 spray into both nostrils 2 (two) times daily. 30 mL 0   polyethylene  glycol powder (GLYCOLAX/MIRALAX) 17 GM/SCOOP powder Take 1 capful (17 g) with water by mouth 2 (two) times daily. 238 g 0   senna-docusate (SENOKOT-S) 8.6-50 MG tablet Take 1 tablet by mouth 2 (two) times daily. 60 tablet 0   sucroferric oxyhydroxide (VELPHORO) 500 MG chewable tablet Chew 1 tablet (500 mg total) by mouth 3 (three) times daily with meals. 90 tablet 0   zolpidem (AMBIEN) 5 MG tablet Take 1 tablet (5 mg total) by mouth at bedtime as needed for sleep (Insomnia). 30 tablet 0   No current facility-administered medications for this visit.     ROS:  See HPI  Physical Exam:  Vitals:   03/17/23 0859  BP: 112/71  Pulse: 67  Resp: (!) 22  Temp: 99.2 F (37.3 C)  SpO2: 93%     Incision:  Left AKA wound as shown below. Not overtly infected. No expressible purulence. Wound bed appears relatively healthy. Wet to dry dressings applied followed by kerlix and ACE  Extremities: RLE with calciphylaxis  Neuro: alert and oriented Abdomen:  soft  Assessment/Plan:  This is a 52 y.o. male who is s/p: left AKA on 11/17/22 by Dr. Myra Gianotti. Left AKA wound dehiscence occurred over past several weeks. Has been getting HH intermittently for wound dressing changes. They called with some concern about it looking worse and malodor. I think this is partially due to inconsistent dressing changes. I discussed that the options are for surgical debridement in OR with VAC application vs more diligent wound care to hopefully get it healed.   - I discussed with patient and his brother, who is willing to do his wound dressing changes, the importance of doing the wet to dry dressings every day and cleaning the wound. They voiced their understanding  - he is scheduled to go to the wound care center on Monday 12/16 and I have recommended that he ask to have his AKA evaluated while he is there - will have him follow up in 2- 3 weeks for wound check    Nathanial Rancher, PA-C [facility] [phone]   Clinic MD:  Hetty Blend

## 2023-03-20 ENCOUNTER — Encounter (HOSPITAL_BASED_OUTPATIENT_CLINIC_OR_DEPARTMENT_OTHER): Payer: 59 | Attending: Internal Medicine | Admitting: Internal Medicine

## 2023-03-20 DIAGNOSIS — N186 End stage renal disease: Secondary | ICD-10-CM | POA: Insufficient documentation

## 2023-03-20 DIAGNOSIS — I132 Hypertensive heart and chronic kidney disease with heart failure and with stage 5 chronic kidney disease, or end stage renal disease: Secondary | ICD-10-CM | POA: Insufficient documentation

## 2023-03-20 DIAGNOSIS — I509 Heart failure, unspecified: Secondary | ICD-10-CM | POA: Diagnosis not present

## 2023-03-20 DIAGNOSIS — L97828 Non-pressure chronic ulcer of other part of left lower leg with other specified severity: Secondary | ICD-10-CM | POA: Insufficient documentation

## 2023-03-20 DIAGNOSIS — I87311 Chronic venous hypertension (idiopathic) with ulcer of right lower extremity: Secondary | ICD-10-CM | POA: Diagnosis present

## 2023-03-20 DIAGNOSIS — Z4789 Encounter for other orthopedic aftercare: Secondary | ICD-10-CM | POA: Diagnosis not present

## 2023-03-20 NOTE — Progress Notes (Signed)
DUPRI, Corey [name] ([id number]) [id number] Page 1 of 4 Visit Report for 03/20/2023 Abuse Risk Screen Details Patient Name: Date of Service: Corey [name], Corey [name]. 03/20/2023 12:30 PM Medical Record Number: [id number] Patient Account Number: [id number] Date of Birth/Sex: Treating RN: June 09, 1970 (52 y.o. Corey [name] Yaretsi Humphres: Gwinda Passe Other Clinician: Referring Geryl Dohn: Treating Rosemond Lyttle/Extender: Aurelio Brash in Treatment: 0 Abuse Risk Screen Items Answer ABUSE RISK SCREEN: Has anyone close to you tried to hurt or harm you recentlyo No Do you feel uncomfortable with anyone in your familyo No Has anyone forced you do things that you didnt want to doo No Electronic Signature(s) Signed: 03/20/2023 4:36:59 PM By: Redmond Pulling RN, BSN Entered By: Redmond Pulling on 03/20/2023 13:06:20 -------------------------------------------------------------------------------- Activities of Daily Living Details Patient Name: Date of Service: [name], Corey [name]. 03/20/2023 12:30 PM Medical Record Number: [id number] Patient Account Number: [id number] Date of Birth/Sex: Treating RN: [birth year] (52 y.o. Corey [name] Dary Dilauro: Gwinda Passe Other Clinician: Referring Deserai Cansler: Treating Andry Bogden/Extender: Aurelio Brash in Treatment: 0 Activities of Daily Living Items Answer Activities of Daily Living (Please select one for each item) Drive Automobile Not Able T Medications ake Completely Able Use T elephone Completely Able [name] for Appearance Completely Able Use T oilet Completely Able Bath / Shower Completely Able Dress Self Completely Able Feed Self Completely Able Walk Not Able Get In / Out Bed Completely Able Housework Need Assistance Prepare Meals Need Assistance Handle Money Completely Able Shop for Self Need Assistance Electronic Signature(s) Signed: 03/20/2023  4:36:59 PM By: Redmond Pulling RN, BSN Entered By: Redmond Pulling on 03/20/2023 13:09:05 [name] ([id number]) [phone] [id number] Page 2 of 4 -------------------------------------------------------------------------------- Education Screening Details Patient Name: Date of Service: Corey [name], Corey [name]. 03/20/2023 12:30 PM Medical Record Number: [id number] Patient Account Number: [id number] Date of Birth/Sex: Treating RN: 06-18-1970 (52 y.o. Corey [name]: Gwinda Passe Other Clinician: Referring Lennix Rotundo: Treating Waynesha Rammel/Extender: Aurelio Brash in Treatment: 0 Learning Preferences/Education Level/Primary Language Learning Preference: Explanation, Demonstration, Printed Material Preferred Language: English Cognitive Barrier Language Barrier: No Translator Needed: No Memory Deficit: No Emotional Barrier: No Cultural/Religious Beliefs Affecting Medical [name]: No Physical Barrier Impaired Vision: Yes Legally Blind Impaired Hearing: No Decreased Hand dexterity: No Knowledge/Comprehension Knowledge Level: Medium Comprehension Level: High Ability to understand written instructions: High Ability to understand verbal instructions: High Motivation Anxiety Level: Calm Cooperation: Cooperative Education Importance: Acknowledges Need Interest in Health Problems: Asks Questions Perception: Coherent Willingness to Engage in Self-Management High Activities: Readiness to Engage in Self-Management High Activities: Electronic Signature(s) Signed: 03/20/2023 4:36:59 PM By: Redmond Pulling RN, BSN Entered By: Redmond Pulling on 03/20/2023 13:09:43 -------------------------------------------------------------------------------- Fall Risk Assessment Details Patient Name: Date of Service: Corey [name], Corey [name]. 03/20/2023 12:30 PM Medical Record Number: [id number] Patient Account Number: [id number] Date of Birth/Sex:  Treating RN: 1970/04/11 (52 y.o. Corey [name]: Gwinda Passe Other Clinician: Referring Sopheap Basic: Treating Ivan Lacher/Extender: Aurelio Brash in Treatment: 0 Fall Risk Assessment Items Have you had 2 or more falls in the last 12 monthso 0 No Have you had any fall that resulted in injury in the last 12 monthso 0 No FALLS RISK SCREEN Corey [name], Corey [name] ([id number]) [id number] Page 3 of 4 History of falling - immediate or within 3 months 0 No Secondary diagnosis (Do you have 2 or more medical diagnoseso) 0 No Ambulatory aid None/bed rest/wheelchair/nurse 0 Yes  Crutches/cane/walker 0 No Furniture 0 No Intravenous therapy Access/Saline/Heparin Lock 0 No Gait/Transferring Normal/ bed rest/ wheelchair 0 Yes Weak (short steps with or without shuffle, stooped but able to lift head while walking, may seek 0 No support from furniture) Impaired (short steps with shuffle, may have difficulty arising from chair, head down, impaired 0 No balance) Mental Status Oriented to own ability 0 Yes Electronic Signature(s) Signed: 03/20/2023 4:36:59 PM By: Redmond Pulling RN, BSN Entered By: Redmond Pulling on 03/20/2023 13:10:04 -------------------------------------------------------------------------------- Foot Assessment Details Patient Name: Date of Service: Corey [name], Corey [name]. 03/20/2023 12:30 PM Medical Record Number: [id number] Patient Account Number: [id number] Date of Birth/Sex: Treating RN: June 08, 1970 (52 y.o. Corey [name] Quinn Quam: Gwinda Passe Other Clinician: Referring Wally Behan: Treating Kelii Chittum/Extender: Aurelio Brash in Treatment: 0 Foot Assessment Items [x]  Unable to perform left foot assessment due to amputation Site Locations + = Sensation present, - = Sensation absent, C = Callus, U = Ulcer R = Redness, W = Warmth, M = Maceration, PU = Pre-ulcerative  lesion F = Fissure, S = Swelling, D = Dryness Assessment Right: Left: Other Deformity: No Prior Foot Ulcer: No Prior Amputation: No Charcot Joint: No Ambulatory Status: Non-ambulatory Assistance Device: Wheelchair GaitZAIAH, Corey [name] ([id number]) [phone].pdf Page 4 of 4 Electronic Signature(s) Signed: 03/20/2023 4:36:59 PM By: Redmond Pulling RN, BSN Entered By: Redmond Pulling on 03/20/2023 13:11:39 -------------------------------------------------------------------------------- Nutrition Risk Screening Details Patient Name: Date of Service: [facility] [name], Corey [name]. 03/20/2023 12:30 PM Medical Record Number: [id number] Patient Account Number: [id number] Date of Birth/Sex: Treating RN: Jul 30, 1970 (52 y.o. Corey [name] Eily Louvier: Gwinda Passe Other Clinician: Referring Jeshawn Melucci: Treating Bijan Ridgley/Extender: Aurelio Brash in Treatment: 0 Height (in): Weight (lbs): Body Mass Index (BMI): Nutrition Risk Screening Items Score Screening NUTRITION RISK SCREEN: I have an illness or condition that made me change the kind and/or amount of food I eat 0 No I eat fewer than two meals per day 0 No I eat few fruits and vegetables, or milk products 0 No I have three or more drinks of beer, liquor or wine almost every day 0 No I have tooth or mouth problems that make it hard for me to eat 0 No I don't always have enough money to buy the food I need 0 No I eat alone most of the time 0 No I take three or more different prescribed or over-the-counter drugs [name] day 1 Yes Without wanting to, I have lost or gained 10 pounds in the last six months 0 No I am not always physically able to shop, cook and/or feed myself 0 No Nutrition Protocols Good Risk Protocol Moderate Risk Protocol High Risk Proctocol Risk Level: Good Risk Score: 1 Electronic Signature(s) Signed: 03/20/2023 4:36:59 PM By: Redmond Pulling RN,  BSN Entered By: Redmond Pulling on 03/20/2023 13:10:29

## 2023-03-22 ENCOUNTER — Ambulatory Visit: Payer: 59

## 2023-03-22 NOTE — Progress Notes (Signed)
Corey [name] ([id number]) [id number] Page 1 of 11 Visit Report for 03/20/2023 Chief Complaint Document Details Patient Name: Date of Service: Corey [name] [name], [place] RK [name]. 03/20/2023 12:30 PM Medical Record Number: [id number] Patient Account Number: [id number] Date of Birth/Sex: Treating RN: [birth year] (52 y.o. M) Primary Care Provider: Gwinda Passe Other Clinician: Referring Provider: Treating Provider/Extender: Aurelio Brash in Treatment: 0 Information Obtained from: Patient Chief Complaint 03/20/2023; patient is here for review of wounds on the left AKA stump and also an area on the right lower leg. Electronic Signature(s) Signed: 03/20/2023 4:04:15 PM By: Baltazar Najjar MD Entered By: Baltazar Najjar on 03/20/2023 11:12:24 -------------------------------------------------------------------------------- Debridement Details Patient Name: Date of Service: Corey NES, [name]. 03/20/2023 12:30 PM Medical Record Number: [id number] Patient Account Number: [id number] Date of Birth/Sex: Treating RN: 03-Feb-1971 (52 y.o. Cline Cools Primary Care Provider: Gwinda Passe Other Clinician: Referring Provider: Treating Provider/Extender: Aurelio Brash in Treatment: 0 Debridement Performed for Assessment: Wound #1 Left Amputation Site - Above Knee Performed By: Clinician Redmond Pulling, RN Debridement Type: Chemical/Enzymatic/Mechanical Agent Used: wound cleanser, gauze Level of Consciousness (Pre-procedure): Awake and Alert Pre-procedure Verification/Time Out No Taken: Pain Control: Lidocaine 4% Topical Solution Percent of Wound Bed Debrided: Instrument: Other : gauze Bleeding: None Response to Treatment: Procedure was tolerated well Level of Consciousness (Post- Awake and Alert procedure): Post Debridement Measurements of Total Wound Length: (cm) 1.5 Width: (cm) 3.5 Depth: (cm) 1 Volume: (cm)  4.123 Character of Wound/Ulcer Post Debridement: Improved Post Procedure Diagnosis Same as Pre-procedure Electronic Signature(s) Signed: 03/20/2023 4:04:15 PM By: Baltazar Najjar MD Signed: 03/20/2023 4:36:59 PM By: Redmond Pulling RN, BSN Corey [name], Corey [name] ([id number]) [phone].pdf Page 2 of 11 Entered By: Redmond Pulling on 03/20/2023 12:06:44 -------------------------------------------------------------------------------- Debridement Details Patient Name: Date of Service: [name], [place] RK [name]. 03/20/2023 12:30 PM Medical Record Number: [id number] Patient Account Number: [id number] Date of Birth/Sex: Treating RN: [birth year] (52 y.o. Cline Cools Primary Care Provider: Gwinda Passe Other Clinician: Referring Provider: Treating Provider/Extender: Aurelio Brash in Treatment: 0 Debridement Performed for Assessment: Wound #2 Left,Lateral Amputation Site - Above Knee Performed By: Clinician Redmond Pulling, RN Debridement Type: Chemical/Enzymatic/Mechanical Agent Used: wound cleanser, gauze Level of Consciousness (Pre-procedure): Awake and Alert Pre-procedure Verification/Time Out No Taken: Pain Control: Lidocaine 4% Topical Solution Percent of Wound Bed Debrided: Instrument: Other : gauze Bleeding: None Response to Treatment: Procedure was tolerated well Level of Consciousness (Post- Awake and Alert procedure): Post Debridement Measurements of Total Wound Length: (cm) 1 Width: (cm) 0.2 Depth: (cm) 0.1 Volume: (cm) 0.016 Character of Wound/Ulcer Post Debridement: Improved Post Procedure Diagnosis Same as Pre-procedure Electronic Signature(s) Signed: 03/20/2023 4:04:15 PM By: Baltazar Najjar MD Signed: 03/20/2023 4:36:59 PM By: Redmond Pulling RN, BSN Entered By: Redmond Pulling on 03/20/2023 12:45:36 -------------------------------------------------------------------------------- HPI Details Patient Name: Date of Service: Corey  NES, [name]. 03/20/2023 12:30 PM Medical Record Number: [id number] Patient Account Number: [id number] Date of Birth/Sex: Treating RN: 12-17-1970 (52 y.o. M) Primary Care Provider: Gwinda Passe Other Clinician: Referring Provider: Treating Provider/Extender: Aurelio Brash in Treatment: 0 History of Present Illness HPI Description: ADMISSION 03/20/2023 This patient is [name] 52 year old man with multiple medical problems. This includes severe PAD and chronic venous insufficiency. He is felt to have chronic infrainguinal arterial occlusive disease. He had [name] on 11/17/2022 secondary to [name] necrotic left heel/foot wound. This was done by Dr. Myra Gianotti. He was seen by podiatry on February 16, 2023 and  was noted at that time to have [name] right lower extremity wound. Since that time however it appears that the right leg wound is healed. The wound on the left AKA stump actually looks quite healthy today. They have been using wet-to-dry dressings. His brother lives with him and is the person changing the dressings. Corey [name] ([id number]) [id number] Page 3 of 11 The patient has chronic renal failure on dialysis. He is not [name] diabetic. He has been on dialysis for about 20 years now. 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 In fact he thinks the area on the right leg also was calciphylaxis however it appears to have healed quite well. He continues to complain of bilateral lower extremity pain including the left AKA which sounds [name] lot like phantom limb pain. Past medical history includes chronic renal failure on dialysis presumably secondary to nephrosclerosis. He has lower extremity neurologic issues apparently related to [name] dissecting type to be thoracic aortic aneurysm. He has hypertension but not diabetes. ABIs were not done in our clinic there is no open wound on the right leg Electronic  Signature(s) Signed: 03/20/2023 4:04:15 PM By: Baltazar Najjar MD Entered By: Baltazar Najjar on 03/20/2023 11:16:34 -------------------------------------------------------------------------------- Physical Exam Details Patient Name: Date of Service: Corey NES, [name]. 03/20/2023 12:30 PM Medical Record Number: [id number] Patient Account Number: [id number] Date of Birth/Sex: Treating RN: July 04, 1970 (52 y.o. M) Primary Care Provider: Gwinda Passe Other Clinician: Referring Provider: Treating Provider/Extender: Aurelio Brash in Treatment: 0 Constitutional Sitting or standing Blood Pressure is within target range for patient.. Pulse regular and within target range for patient.[name] Respirations regular, non-labored and within target range.. Temperature is normal and within the target range for the patient.[name] Appears in no distress. Respiratory Appeared normal. He has oxygen on at 2 L via concentrator. Cardiovascular Femoral pulse was brisk on the left. Pedal pulses were reduced but palpable in the right foot. Musculoskeletal Significant right knee flexion contracture. Psychiatric appears at normal baseline. Notes Wound exam; Left AKA stump. At the tip right in the midline [name] clean oval-shaped wound. No debridement is required. There was no probing and no tunneling. Skin around this area is somewhat dark but no evidence of infection. The area appears to be reasonably well-perfused I inspected the right leg there is edema what I think is signs of chronic venous disease but no wound. The area was on the anterior medial part of the right leg but there is nothing open here now Electronic Signature(s) Signed: 03/20/2023 4:04:15 PM By: Baltazar Najjar MD Entered By: Baltazar Najjar on 03/20/2023 11:19:00 -------------------------------------------------------------------------------- Physician Orders Details Patient Name: Date of Service: Corey NES, [name]. 03/20/2023  12:30 PM Medical Record Number: [id number] Patient Account Number: [id number] Date of Birth/Sex: Treating RN: 08-Aug-1970 (52 y.o. Cline Cools Primary Care Provider: Gwinda Passe Other Clinician: Referring Provider: Treating Provider/Extender: Aurelio Brash in Treatment: 0 Verbal / Phone Orders: No Corey [name] ([id number]) [id number] Page 4 of 11 Diagnosis Coding Follow-up Appointments ppointment in 1 week. - Dr Leanord Hawking 10:45 Monday 03/27/23 Return [name] Anesthetic (In clinic) Topical Lidocaine 4% applied to wound bed Bathing/ Shower/ Hygiene May shower and wash wound with soap and water. - Shower every other day. Only before dressing change. Do not get dressing wet Wound Treatment Wound #1 - Amputation Site - Above Knee Wound Laterality: Left Cleanser: Soap and Water Every Other Day/15 Days Discharge Instructions: May  shower and wash wound with dial antibacterial soap and water prior to dressing change. Cleanser: Vashe 5.8 (oz) Every Other Day/15 Days Discharge Instructions: Cleanse the wound with Vashe prior to applying [name] clean dressing using gauze sponges, not tissue or cotton balls. Peri-Wound Care: Skin Prep (Generic) Every Other Day/15 Days Discharge Instructions: Use skin prep as directed Topical: Skintegrity Hydrogel 4 (oz) (DME) (Generic) Every Other Day/15 Days Discharge Instructions: Apply hydrogel as directed Prim Dressing: Promogran Prisma Matrix, 4.34 (sq in) (silver collagen) (DME) (Generic) Every Other Day/15 Days ary Discharge Instructions: Moisten collagen with saline or hydrogel Secondary Dressing: Woven Gauze Sponge, Non-Sterile 4x4 in (DME) (Generic) Every Other Day/15 Days Discharge Instructions: Apply over primary dressing as directed. Secondary Dressing: Zetuvit Plus Silicone Border Dressing 4x4 (in/in) (DME) (Generic) Every Other Day/15 Days Discharge Instructions: Apply silicone border over primary  dressing as directed. Wound #2 - Amputation Site - Above Knee Wound Laterality: Left, Lateral Cleanser: Soap and Water Every Other Day/15 Days Discharge Instructions: May shower and wash wound with dial antibacterial soap and water prior to dressing change. Cleanser: Vashe 5.8 (oz) Every Other Day/15 Days Discharge Instructions: Cleanse the wound with Vashe prior to applying [name] clean dressing using gauze sponges, not tissue or cotton balls. Peri-Wound Care: Skin Prep Every Other Day/15 Days Discharge Instructions: Use skin prep as directed Topical: Skintegrity Hydrogel 4 (oz) Every Other Day/15 Days Discharge Instructions: Apply hydrogel as directed Prim Dressing: Promogran Prisma Matrix, 4.34 (sq in) (silver collagen) Every Other Day/15 Days ary Discharge Instructions: Moisten collagen with saline or hydrogel Secondary Dressing: Zetuvit Plus Silicone Border Dressing 3x3 (in/in) (DME) (Generic) Every Other Day/15 Days Discharge Instructions: Apply silicone border over primary dressing as directed. Patient Medications llergies: Cipro, heparin, doxercalciferol, tomato, oxyquinoline, mushroom [name] Notifications Medication Indication Start End 03/21/2023 lidocaine DOSE topical 4 % cream - cream topical once daily Electronic Signature(s) Signed: 03/20/2023 4:04:15 PM By: Baltazar Najjar MD Signed: 03/20/2023 4:36:59 PM By: Redmond Pulling RN, BSN Entered By: Redmond Pulling on 03/20/2023 12:46:21 Corey [name] ([id number]) [phone].pdf Page 5 of 11 -------------------------------------------------------------------------------- Problem List Details Patient Name: Date of Service: Corey [name] [name], [place] RK [name]. 03/20/2023 12:30 PM Medical Record Number: [id number] Patient Account Number: [id number] Date of Birth/Sex: Treating RN: Jul 31, 1970 (52 y.o. M) Primary Care Provider: Gwinda Passe Other Clinician: Referring Provider: Treating Provider/Extender: Aurelio Brash in Treatment: 0 Active Problems ICD-10 Encounter Code Description Active Date MDM Diagnosis L97.828 Non-pressure chronic ulcer of other part of left lower leg with other specified 03/20/2023 No Yes severity Z47.81 Encounter for orthopedic aftercare following surgical amputation 03/20/2023 No Yes I87.311 Chronic venous hypertension (idiopathic) with ulcer of right lower extremity 03/20/2023 No Yes E83.59 Other disorders of calcium metabolism 03/20/2023 No Yes I13.2 Hypertensive heart and chronic kidney disease with heart failure and with 03/20/2023 No Yes stage 5 chronic kidney disease, or end stage renal disease Inactive Problems Resolved Problems Electronic Signature(s) Signed: 03/20/2023 4:04:15 PM By: Baltazar Najjar MD Entered By: Baltazar Najjar on 03/20/2023 11:10:55 -------------------------------------------------------------------------------- Progress Note Details Patient Name: Date of Service: Corey NES, [name]. 03/20/2023 12:30 PM Medical Record Number: [id number] Patient Account Number: [id number] Date of Birth/Sex: Treating RN: 05-08-1970 (52 y.o. M) Primary Care Provider: Gwinda Passe Other Clinician: Referring Provider: Treating Provider/Extender: Aurelio Brash in Treatment: 0 Subjective Chief Complaint Information obtained from Patient 03/20/2023; patient is here for review of wounds on the left AKA stump and also an area on the right lower leg. Corey [name],  Corey [name] ([id number]) [id number] Page 6 of 11 History of Present Illness (HPI) ADMISSION 03/20/2023 This patient is [name] 52 year old man with multiple medical problems. This includes severe PAD and chronic venous insufficiency. He is felt to have chronic infrainguinal arterial occlusive disease. He had [name] on 11/17/2022 secondary to [name] necrotic left heel/foot wound. This was done by Dr. Myra Gianotti. He was seen by podiatry on February 16, 2023  and was noted at that time to have [name] right lower extremity wound. Since that time however it appears that the right leg wound is healed. The wound on the left AKA stump actually looks quite healthy today. They have been using wet-to-dry dressings. His brother lives with him and is the person changing the dressings. The patient has chronic renal failure on dialysis. He is not [name] diabetic. He has been on dialysis for about 20 years now. 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 In fact he thinks the area on the right leg also was calciphylaxis however it appears to have healed quite well. He continues to complain of bilateral lower extremity pain including the left AKA which sounds [name] lot like phantom limb pain. Past medical history includes chronic renal failure on dialysis presumably secondary to nephrosclerosis. He has lower extremity neurologic issues apparently related to [name] dissecting type to be thoracic aortic aneurysm. He has hypertension but not diabetes. ABIs were not done in our clinic there is no open wound on the right leg Patient History Information obtained from Patient. Allergies Cipro, heparin, doxercalciferol, tomato, oxyquinoline, mushroom Family History Cancer - Mother,Father, Heart Disease, Hypertension - Mother,Father, Kidney Disease - Siblings, Stroke - Siblings, Thyroid Problems - Siblings, No family history of Diabetes, Seizures. Social History Never smoker, Marital Status - Divorced, Alcohol Use - Never, Drug Use - No History, Caffeine Use - Daily. Medical History Cardiovascular Patient has history of Arrhythmia - [name]. fib, Hypertension, Peripheral Arterial Disease - monophasic waveforms- vein and vascular follows Genitourinary Patient has history of End Stage Renal Disease - hemodialysis Musculoskeletal Patient has history of Osteoarthritis Neurologic Patient has history of Neuropathy Hospitalization/Surgery History -  11/17/22 left AKA dr. Myra Gianotti [facility]. - 7/24 chest tube insertion. - 01/2019 cardioversion. - fistula left 04/2016. Medical [name] Surgical History Notes nd Constitutional Symptoms (General Health) Dissection of thoracic aorta Eyes legally blind (Stargardt disease) Respiratory damage to lungs due to pneumonia Cardiovascular cardioversion Thoracoabdominal aortic aneurysm (TAAA) without rupture [facility]) Musculoskeletal Calciphylaxis Psychiatric depression Review of Systems (ROS) Constitutional Symptoms (General Health) Denies complaints or symptoms of Fatigue, Fever, Chills, Marked Weight Change. Eyes Complains or has symptoms of Glasses / Contacts. Ear/Nose/Mouth/Throat Denies complaints or symptoms of Chronic sinus problems or rhinitis. Gastrointestinal Denies complaints or symptoms of Frequent diarrhea, Nausea, Vomiting. Endocrine Denies complaints or symptoms of Heat/cold intolerance. Integumentary (Skin) Complains or has symptoms of Wounds - left amp site. Musculoskeletal Complains or has symptoms of Muscle Weakness. Neurologic Complains or has symptoms of Numbness/parasthesias - legs. Objective Constitutional Sitting or standing Blood Pressure is within target range for patient.. Pulse regular and within target range for patient.[name] Respirations regular, non-labored and within target range.. Temperature is normal and within the target range for the patient.[name] Appears in no distress. Corey [name] ([id number]) [id number] Page 7 of 11 Vitals Time Taken: 12:45 PM, Temperature: 98.3 F, Pulse: 70 bpm, Respiratory Rate: 18 breaths/min, Blood Pressure: 116/72 mmHg. Respiratory Appeared normal. He has oxygen on at 2 L via concentrator. Cardiovascular Femoral  pulse was brisk on the left. Pedal pulses were reduced but palpable in the right foot. Musculoskeletal Significant right knee flexion contracture. Psychiatric appears at normal baseline. General Notes: Wound  exam;  Left AKA stump. At the tip right in the midline [name] clean oval-shaped wound. No debridement is required. There was no probing and no tunneling. Skin around this area is somewhat dark but no evidence of infection. The area appears to be reasonably well-perfused I inspected the right leg there is edema what I think is signs of chronic venous disease but no wound. The area was on the anterior medial part of the right leg but there is nothing open here now Integumentary (Hair, Skin) Wound #1 status is Open. Original cause of wound was Gradually Appeared. The date acquired was: 11/17/2022. The wound is located on the Left Amputation Site - Above Knee. The wound measures 1.5cm length x 3.5cm width x 1cm depth; 4.123cm^2 area and 4.123cm^3 volume. There is Fat Layer (Subcutaneous Tissue) exposed. There is no tunneling or undermining noted. There is [name] medium amount of serosanguineous drainage noted. There is medium (34-66%) red granulation within the wound bed. There is [name] medium (34-66%) amount of necrotic tissue within the wound bed including Adherent Slough. The periwound skin appearance had no abnormalities noted for texture. The periwound skin appearance did not exhibit: Dry/Scaly, Maceration, Atrophie Blanche, Cyanosis, Ecchymosis, Hemosiderin Staining, Mottled, Pallor, Rubor, Erythema. Periwound temperature was noted as Hot. The periwound has tenderness on palpation. Wound #2 status is Open. Original cause of wound was Other Lesion. The date acquired was: 02/01/2023. The wound is located on the Left,Lateral Amputation Site - Above Knee. The wound measures 1cm length x 0.2cm width x 0.1cm depth; 0.157cm^2 area and 0.016cm^3 volume. There is Fat Layer (Subcutaneous Tissue) exposed. There is no tunneling or undermining noted. There is [name] medium amount of serosanguineous drainage noted. There is medium (34-66%) red granulation within the wound bed. There is [name] medium (34-66%) amount of necrotic tissue  within the wound bed including Adherent Slough. Assessment Active Problems ICD-10 Non-pressure chronic ulcer of other part of left lower leg with other specified severity Encounter for orthopedic aftercare following surgical amputation Chronic venous hypertension (idiopathic) with ulcer of right lower extremity Other disorders of calcium metabolism Hypertensive heart and chronic kidney disease with heart failure and with stage 5 chronic kidney disease, or end stage renal disease Procedures Wound #1 Pre-procedure diagnosis of Wound #1 is an Open Surgical Wound located on the Left Amputation Site - Above Knee . There was [name] Chemical/Enzymatic/Mechanical debridement performed by Redmond Pulling, RN. With the following instrument(s): gauze after achieving pain control using Lidocaine 4% Topical Solution. Other agent used was wound cleanser, gauze. There was no bleeding. The procedure was tolerated well. Post Debridement Measurements: 1.5cm length x 3.5cm width x 1cm depth; 4.123cm^3 volume. Character of Wound/Ulcer Post Debridement is improved. Post procedure Diagnosis Wound #1: Same as Pre-Procedure Wound #2 Pre-procedure diagnosis of Wound #2 is [name] Dehisced Wound located on the Left,Lateral Amputation Site - Above Knee . There was [name] Chemical/Enzymatic/Mechanical debridement performed by Redmond Pulling, RN. With the following instrument(s): gauze after achieving pain control using Lidocaine 4% Topical Solution. Other agent used was wound cleanser, gauze. There was no bleeding. The procedure was tolerated well. Post Debridement Measurements: 1cm length x 0.2cm width x 0.1cm depth; 0.016cm^3 volume. Character of Wound/Ulcer Post Debridement is improved. Post procedure Diagnosis Wound #2: Same as Pre-Procedure Plan Follow-up Appointments: Return Appointment in 1 week. - Dr  Leanord Hawking 10:45 Monday 03/27/23 Anesthetic: (In clinic) Topical Lidocaine 4% applied to wound bed Bathing/ Shower/ Hygiene: May  shower and wash wound with soap and water. - Shower every other day. Only before dressing change. Do not get dressing wet The following medication(s) was prescribed: lidocaine topical 4 % cream cream topical once daily was prescribed at facility WOUND #1: - Amputation Site - Above Knee Wound Laterality: Left Corey [name], Corey [name] ([id number]) [phone].pdf Page 8 of 11 Cleanser: Soap and Water Every Other Day/15 Days Discharge Instructions: May shower and wash wound with dial antibacterial soap and water prior to dressing change. Cleanser: Vashe 5.8 (oz) Every Other Day/15 Days Discharge Instructions: Cleanse the wound with Vashe prior to applying [name] clean dressing using gauze sponges, not tissue or cotton balls. Peri-Wound Care: Skin Prep (Generic) Every Other Day/15 Days Discharge Instructions: Use skin prep as directed Topical: Skintegrity Hydrogel 4 (oz) (DME) (Generic) Every Other Day/15 Days Discharge Instructions: Apply hydrogel as directed Prim Dressing: Promogran Prisma Matrix, 4.34 (sq in) (silver collagen) (DME) (Generic) Every Other Day/15 Days ary Discharge Instructions: Moisten collagen with saline or hydrogel Secondary Dressing: Woven Gauze Sponge, Non-Sterile 4x4 in (DME) (Generic) Every Other Day/15 Days Discharge Instructions: Apply over primary dressing as directed. Secondary Dressing: Zetuvit Plus Silicone Border Dressing 4x4 (in/in) (DME) (Generic) Every Other Day/15 Days Discharge Instructions: Apply silicone border over primary dressing as directed. WOUND #2: - Amputation Site - Above Knee Wound Laterality: Left, Lateral Cleanser: Soap and Water Every Other Day/15 Days Discharge Instructions: May shower and wash wound with dial antibacterial soap and water prior to dressing change. Cleanser: Vashe 5.8 (oz) Every Other Day/15 Days Discharge Instructions: Cleanse the wound with Vashe prior to applying [name] clean dressing using gauze sponges, not tissue or  cotton balls. Peri-Wound Care: Skin Prep Every Other Day/15 Days Discharge Instructions: Use skin prep as directed Topical: Skintegrity Hydrogel 4 (oz) Every Other Day/15 Days Discharge Instructions: Apply hydrogel as directed Prim Dressing: Promogran Prisma Matrix, 4.34 (sq in) (silver collagen) Every Other Day/15 Days ary Discharge Instructions: Moisten collagen with saline or hydrogel Secondary Dressing: Zetuvit Plus Silicone Border Dressing 3x3 (in/in) (DME) (Generic) Every Other Day/15 Days Discharge Instructions: Apply silicone border over primary dressing as directed. 1. We will use moistened silver collagen on the left leg, with [name] border foam cover. I think he has enough blood flow to the stump to heal this. 2. There is nothing open on the right leg. I would find it unusual for [name] wound secondary to calciphylaxis to heal in 1 month however it is certainly possible. He also has what appears to be severe chronic venous disease. The edema in the right leg is not well-controlled. He had no compression here. 3. We advised skin moisturizer twice [name] day to the skin on the right leg from the tip of his toes to the knee. Electronic Signature(s) Signed: 03/20/2023 5:10:17 PM By: Shawn Stall RN, BSN Signed: 03/21/2023 4:32:13 PM By: Baltazar Najjar MD Previous Signature: 03/20/2023 4:04:15 PM Version By: Baltazar Najjar MD Entered By: Shawn Stall on 03/20/2023 14:08:22 -------------------------------------------------------------------------------- HxROS Details Patient Name: Date of Service: Corey NES, [name]. 03/20/2023 12:30 PM Medical Record Number: [id number] Patient Account Number: [id number] Date of Birth/Sex: Treating RN: Jan 30, 1971 (52 y.o. Corey [name] Primary Care Provider: Gwinda Passe Other Clinician: Referring Provider: Treating Provider/Extender: Aurelio Brash in Treatment: 0 Information Obtained From Patient Constitutional Symptoms  (General Health) Complaints and Symptoms: Negative for: Fatigue; Fever; Chills; Marked Weight  Change Medical History: Past Medical History Notes: Dissection of thoracic aorta Eyes Complaints and Symptoms: Positive for: Glasses / Contacts Medical History: Past Medical History Notes: legally blind (Stargardt disease) Ear/Nose/Mouth/Throat Complaints and Symptoms: Corey [name], Corey [name] ([id number]) [id number] Page 9 of 11 Negative for: Chronic sinus problems or rhinitis Gastrointestinal Complaints and Symptoms: Negative for: Frequent diarrhea; Nausea; Vomiting Endocrine Complaints and Symptoms: Negative for: Heat/cold intolerance Integumentary (Skin) Complaints and Symptoms: Positive for: Wounds - left amp site Musculoskeletal Complaints and Symptoms: Positive for: Muscle Weakness Medical History: Positive for: Osteoarthritis Past Medical History Notes: Calciphylaxis Neurologic Complaints and Symptoms: Positive for: Numbness/parasthesias - legs Medical History: Positive for: Neuropathy Hematologic/Lymphatic Respiratory Medical History: Past Medical History Notes: damage to lungs due to pneumonia Cardiovascular Medical History: Positive for: Arrhythmia - [name]. fib; Hypertension; Peripheral Arterial Disease - monophasic waveforms- vein and vascular follows Past Medical History Notes: cardioversion Thoracoabdominal aortic aneurysm (TAAA) without rupture [facility]) Genitourinary Medical History: Positive for: End Stage Renal Disease - hemodialysis Immunological Oncologic Psychiatric Medical History: Past Medical History Notes: depression Immunizations Pneumococcal Vaccine: Received Pneumococcal Vaccination: Yes Received Pneumococcal Vaccination On or After 60th Birthday: No Implantable Devices No devices added Hospitalization / Surgery History Type of Hospitalization/Surgery 11/17/22 left [facility] dr. Myra Gianotti [facility] Corey [name], Corey [name] ([id number])  [phone].pdf Page 10 of 11 7/24 chest tube insertion 01/2019 cardioversion fistula left 04/2016 Family and Social History Cancer: Yes - Mother,Father; Diabetes: No; Heart Disease: Yes; Hypertension: Yes - Mother,Father; Kidney Disease: Yes - Siblings; Seizures: No; Stroke: Yes - Siblings; Thyroid Problems: Yes - Siblings; Never smoker; Marital Status - Divorced; Alcohol Use: Never; Drug Use: No History; Caffeine Use: Daily Social Determinants of Health (SDOH) 1. In the past 2 months, did you or others you live with eat smaller meals or skip meals because you didn't have money for foodo : No 2. Are you homeless or worried that you might be in the futureo : No 3. Do you have trouble paying for your utilities (gas, electricity, phone)o : Yes 4. Do you have trouble finding or paying for [name] rideo : No 5. Do you need daycare, or better daycare, for your kidso : No 6. Are you unemployed or without regular incomeo : No 7. Do you need help finding [name] better jobo : No 8. Do you need help getting more educationo : No 9. Are you concerned about someone in your home using drugs or alcoholo : No 10. Do you feel unsafe in your daily lifeo : No 11. Is anyone in your home threatening or abusing youo : No 12. Do you lack quality relationships that make you feel valued and supportedo : No 13. Do you need help getting cultural information in [name] language you understando : No 14. Do you need help getting internet accesso : No Advanced Directives and Instructions Advanced Directives: No Do not resuscitate: No Living Will: No Medical Power of Attorney: Yes; Andi Macbride - brother Copy Provided: No Electronic Signature(s) Signed: 03/20/2023 4:04:15 PM By: Baltazar Najjar MD Signed: 03/20/2023 4:36:59 PM By: Redmond Pulling RN, BSN Signed: 03/20/2023 5:10:17 PM By: Shawn Stall RN, BSN Entered By: Redmond Pulling on 03/20/2023  10:06:03 -------------------------------------------------------------------------------- SuperBill Details Patient Name: Date of Service: Corey NES, [name]. 03/20/2023 Medical Record Number: [id number] Patient Account Number: [id number] Date of Birth/Sex: Treating RN: 1971-01-29 (52 y.o. Cline Cools Primary Care Provider: Gwinda Passe Other Clinician: Referring Provider: Treating Provider/Extender: Aurelio Brash in Treatment: 0 Diagnosis Coding ICD-10 Codes Code Description [phone] Non-pressure  chronic ulcer of other part of left lower leg with other specified severity Z47.81 Encounter for orthopedic aftercare following surgical amputation I87.311 Chronic venous hypertension (idiopathic) with ulcer of right lower extremity E83.59 Other disorders of calcium metabolism I13.2 Hypertensive heart and chronic kidney disease with heart failure and with stage 5 chronic kidney disease, or end stage renal disease Facility Procedures : CPT4 Code: [id number] Description: 99213 - WOUND CARE VISIT-LEV 3 EST PT Modifier: Quantity: 1 Physician Procedures : CPT4 Code Description Modifier [id number] WC PHYS LEVEL 3 NEW PT ICD-10 Diagnosis Description L97.828 Non-pressure chronic ulcer of other part of left lower leg with other specified severity Corey [name], Corey [name] ([id number])  [phone].pdf P Z47.81 Encounter for orthopedic aftercare following surgical amputation I87.311 Chronic venous hypertension (idiopathic) with ulcer of right lower extremity E83.59 Other disorders of calcium metabolism Quantity: 1 age [age] Electronic Signature(s) Signed: 03/20/2023 4:04:15 PM By: Baltazar Najjar MD Entered By: Baltazar Najjar on 03/20/2023 11:22:42

## 2023-03-23 NOTE — Progress Notes (Signed)
Corey [name] ([id number]) [id number] Page 1 of 10 Visit Report for 03/20/2023 Allergy List Details Patient Name: Date of Service: Corey [name] [name], [place] RK [name]. 03/20/2023 12:30 PM Medical Record Number: [id number] Patient Account Number: [id number] Date of Birth/Sex: Treating RN: [birth year] (52 y.o. Tammy Sours Primary Care Lula Michaux: Gwinda Passe Other Clinician: Referring Tanijah Morais: Treating Lerone Onder/Extender: Aurelio Brash in Treatment: 0 Allergies Active Allergies Cipro heparin doxercalciferol tomato oxyquinoline mushroom Allergy Notes Electronic Signature(s) Signed: 03/20/2023 4:36:59 PM By: Redmond Pulling RN, BSN Entered By: Redmond Pulling on 03/20/2023 12:48:16 -------------------------------------------------------------------------------- Arrival Information Details Patient Name: Date of Service: Corey NES, MA RK [name]. 03/20/2023 12:30 PM Medical Record Number: [id number] Patient Account Number: [id number] Date of Birth/Sex: Treating RN: 1971-01-06 (52 y.o. Corey [name] Primary Care Isaiyah Feldhaus: Gwinda Passe Other Clinician: Referring Gracynn Rajewski: Treating Zimir Corey [name]/Extender: Aurelio Brash in Treatment: 0 Visit Information Patient Arrived: Wheel Chair Arrival Time: 12:40 Accompanied By: brother Transfer Assistance: None Patient Identification Verified: Yes Secondary Verification Process Completed: Yes Electronic Signature(s) Signed: 03/20/2023 4:36:59 PM By: Redmond Pulling RN, BSN Entered By: Redmond Pulling on 03/20/2023 12:47:18 Corey [name] ([id number]) [id number] Page 2 of 10 -------------------------------------------------------------------------------- Clinic Level of Care Assessment Details Patient Name: Date of Service: Corey [name] [name], [place] RK [name]. 03/20/2023 12:30 PM Medical Record Number: [id number] Patient Account Number: [id number] Date of Birth/Sex: Treating  RN: [birth year] (52 y.o. Corey [name] Primary Care Joselyn Edling: Gwinda Passe Other Clinician: Referring Meta Kroenke: Treating Tyra Michelle/Extender: Aurelio Brash in Treatment: 0 Clinic Level of Care Assessment Items TOOL 4 Quantity Score X- 1 0 Use when only an EandM is performed on FOLLOW-UP visit ASSESSMENTS - Nursing Assessment / Reassessment X- 1 10 Reassessment of Co-morbidities (includes updates in patient status) X- 1 5 Reassessment of Adherence to Treatment Plan ASSESSMENTS - Wound and Skin [name] ssessment / Reassessment []  - 0 Simple Wound Assessment / Reassessment - one wound X- 2 5 Complex Wound Assessment / Reassessment - multiple wounds []  - 0 Dermatologic / Skin Assessment (not related to wound area) ASSESSMENTS - Focused Assessment []  - 0 Circumferential Edema Measurements - multi extremities []  - 0 Nutritional Assessment / Counseling / Intervention []  - 0 Lower Extremity Assessment (monofilament, tuning fork, pulses) []  - 0 Peripheral Arterial Disease Assessment (using hand held doppler) ASSESSMENTS - Ostomy and/or Continence Assessment and Care []  - 0 Incontinence Assessment and Management []  - 0 Ostomy Care Assessment and Management (repouching, etc.) PROCESS - Coordination of Care []  - 0 Simple Patient / Family Education for ongoing care []  - 0 Complex (extensive) Patient / Family Education for ongoing care X- 1 10 Staff obtains Chiropractor, Records, T Results / Process Orders est []  - 0 Staff telephones HHA, Nursing Homes / Clarify orders / etc []  - 0 Routine Transfer to another Facility (non-emergent condition) []  - 0 Routine Hospital Admission (non-emergent condition) X- 1 15 New Admissions / Manufacturing engineer / Ordering NPWT Apligraf, etc. , []  - 0 Emergency Hospital Admission (emergent condition) []  - 0 Simple Discharge Coordination []  - 0 Complex (extensive) Discharge Coordination PROCESS - Special Needs []   - 0 Pediatric / Minor Patient Management []  - 0 Isolation Patient Management []  - 0 Hearing / Language / Visual special needs []  - 0 Assessment of Community assistance (transportation, D/C planning, etc.) []  - 0 Additional assistance / Altered mentation []  - 0 Support Surface(s) Assessment (bed, cushion, seat, etc.) INTERVENTIONS - Wound Cleansing / Measurement Lamagna, Ermon [name] ([id number]) [id number] Page 3 of  10 []  - 0 Simple Wound Cleansing - one wound X- 2 5 Complex Wound Cleansing - multiple wounds X- 1 5 Wound Imaging (photographs - any number of wounds) []  - 0 Wound Tracing (instead of photographs) []  - 0 Simple Wound Measurement - one wound X- 2 5 Complex Wound Measurement - multiple wounds INTERVENTIONS - Wound Dressings X - Small Wound Dressing one or multiple wounds 1 10 []  - 0 Medium Wound Dressing one or multiple wounds []  - 0 Large Wound Dressing one or multiple wounds X- 1 5 Application of Medications - topical []  - 0 Application of Medications - injection INTERVENTIONS - Miscellaneous []  - 0 External ear exam []  - 0 Specimen Collection (cultures, biopsies, blood, body fluids, etc.) []  - 0 Specimen(s) / Culture(s) sent or taken to Lab for analysis []  - 0 Patient Transfer (multiple staff / Nurse, adult / Similar devices) []  - 0 Simple Staple / Suture removal (25 or less) []  - 0 Complex Staple / Suture removal (26 or more) []  - 0 Hypo / Hyperglycemic Management (close monitor of Blood Glucose) []  - 0 Ankle / Brachial Index (ABI) - do not check if billed separately X- 1 5 Vital Signs Has the patient been seen at the hospital within the last three years: Yes Total Score: 95 Level Of Care: New/Established - Level 3 Electronic Signature(s) Signed: 03/20/2023 4:36:59 PM By: Redmond Pulling RN, BSN Entered By: Redmond Pulling on 03/20/2023  14:02:25 -------------------------------------------------------------------------------- Encounter Discharge Information Details Patient Name: Date of Service: Corey NES, MA RK [name]. 03/20/2023 12:30 PM Medical Record Number: [id number] Patient Account Number: [id number] Date of Birth/Sex: Treating RN: Aug 20, 1970 (52 y.o. Corey [name] Primary Care Nezar Buckles: Gwinda Passe Other Clinician: Referring Sumiye Hirth: Treating Lashonne Shull/Extender: Aurelio Brash in Treatment: 0 Encounter Discharge Information Items Post Procedure Vitals Discharge Condition: Stable Temperature (F): 98.3 Ambulatory Status: Wheelchair Pulse (bpm): 70 Discharge Destination: Home Respiratory Rate (breaths/min): 18 Transportation: Private Auto Blood Pressure (mmHg): 116/72 Accompanied By: brother Schedule Follow-up Appointment: Yes Clinical Summary of Care: Patient Declined Electronic Signature(s) Signed: 03/20/2023 4:36:59 PM By: Redmond Pulling RN, BSN [place], Erion [name] ([id number]) [phone].pdf Page 4 of 10 Entered By: Redmond Pulling on 03/20/2023 15:48:26 -------------------------------------------------------------------------------- Lower Extremity Assessment Details Patient Name: Date of Service: Corey [name] [facility] [name], [place] RK [name]. 03/20/2023 12:30 PM Medical Record Number: [id number] Patient Account Number: [id number] Date of Birth/Sex: Treating RN: 05/14/1970 (52 y.o. Corey [name] Primary Care Leshay Desaulniers: Gwinda Passe Other Clinician: Referring Ziggy Reveles: Treating Naylani Bradner/Extender: Aurelio Brash in Treatment: 0 Electronic Signature(s) Signed: 03/20/2023 4:36:59 PM By: Redmond Pulling RN, BSN Entered By: Redmond Pulling on 03/20/2023 13:11:47 -------------------------------------------------------------------------------- Multi Wound Chart Details Patient Name: Date of Service: Corey NES, MA RK [name]. 03/20/2023 12:30 PM Medical Record Number:  [id number] Patient Account Number: [id number] Date of Birth/Sex: Treating RN: 11-07-1970 (52 y.o. M) Primary Care Roxana Lai: Gwinda Passe Other Clinician: Referring Nikola Blackston: Treating Corey [name]/Extender: Aurelio Brash in Treatment: 0 Vital Signs Height(in): Pulse(bpm): 70 Weight(lbs): Blood Pressure(mmHg): 116/72 Body Mass Index(BMI): Temperature(F): 98.3 Respiratory Rate(breaths/min): 18 [1:Photos:] [2:No Photos] [N/[name]:N/[name]] Left Amputation Site - Above Knee Left, Lateral Amputation Site - Above N/[name] Wound Location: Knee Gradually Appeared Other Lesion N/[name] Wounding Event: Open Surgical Wound Dehisced Wound N/[name] Primary Etiology: Arrhythmia, Hypertension, Peripheral Arrhythmia, Hypertension, Peripheral N/[name] Comorbid History: Arterial Disease, End Stage Renal Arterial Disease, End Stage Renal Disease, Osteoarthritis Disease, Osteoarthritis, Neuropathy 11/17/2022 02/01/2023 N/[name] Date Acquired: 0 0 N/[name] Weeks of Treatment: Open Open N/[name] Wound Status:  No No N/[name] Wound Recurrence: 1.5x3.5x1 1x0.2x0.1 N/[name] Measurements L x W x D (cm) 4.123 0.157 N/[name] [name] (cm) : rea 4.123 0.016 N/[name] Volume (cm) : Full Thickness Without Exposed Full Thickness Without Exposed N/[name] Classification: Support Structures Support Structures Medium Medium N/[name] Exudate Amount: Serosanguineous Serosanguineous N/[name] Exudate Type: red, brown red, brown N/[name] Exudate ColorVANNA, Corey [name] [name] ([id number]) [id number] Page 5 of 10 Medium (34-66%) Medium (34-66%) N/[name] Granulation Amount: Red Red N/[name] Granulation Quality: Medium (34-66%) Medium (34-66%) N/[name] Necrotic Amount: Fat Layer (Subcutaneous Tissue): Yes Fat Layer (Subcutaneous Tissue): Yes N/[name] Exposed Structures: Fascia: No Tendon: No Muscle: No Joint: No Bone: No None None N/[name] Epithelialization: Excoriation: No N/[name] Periwound Skin Texture: Induration: No Callus: No Crepitus: No Rash: No Scarring:  No Maceration: No N/[name] Periwound Skin Moisture: Dry/Scaly: No Atrophie Blanche: No N/[name] Periwound Skin Color: Cyanosis: No Ecchymosis: No Erythema: No Hemosiderin Staining: No Mottled: No Pallor: No Rubor: No Hot N/[name] N/[name] Temperature: Yes N/[name] N/[name] Tenderness on Palpation: Treatment Notes Electronic Signature(s) Signed: 03/20/2023 4:04:15 PM By: Baltazar Najjar MD Entered By: Baltazar Najjar on 03/20/2023 14:11:02 -------------------------------------------------------------------------------- Multi-Disciplinary Care Plan Details Patient Name: Date of Service: Corey [name] NES, MA RK [name]. 03/20/2023 12:30 PM Medical Record Number: [id number] Patient Account Number: [id number] Date of Birth/Sex: Treating RN: Jun 10, 1970 (52 y.o. Corey [name] Primary Care Amena Dockham: Gwinda Passe Other Clinician: Referring Oluwaseun Bruyere: Treating Najia Hurlbutt/Extender: Aurelio Brash in Treatment: 0 Active Inactive Orientation to the Wound Care Program Nursing Diagnoses: Knowledge deficit related to the wound healing center program Goals: Patient/caregiver will verbalize understanding of the Wound Healing Center Program Date Initiated: 03/20/2023 Target Resolution Date: 04/03/2023 Goal Status: Active Interventions: Provide education on orientation to the wound center Notes: Wound/Skin Impairment Nursing Diagnoses: Impaired tissue integrity Knowledge deficit related to ulceration/compromised skin integrity Goals: Patient/caregiver will verbalize understanding of skin care regimen [place], DIRKSEN [name] ([id number]) [id number] Page 6 of 10 Date Initiated: 03/20/2023 Target Resolution Date: 05/04/2023 Goal Status: Active Ulcer/skin breakdown will have [name] volume reduction of 30% by week 4 Date Initiated: 03/20/2023 Target Resolution Date: 04/17/2023 Goal Status: Active Interventions: Assess patient/caregiver ability to obtain necessary supplies Assess  patient/caregiver ability to perform ulcer/skin care regimen upon admission and as needed Assess ulceration(s) every visit Provide education on ulcer and skin care Notes: Electronic Signature(s) Signed: 03/20/2023 4:36:59 PM By: Redmond Pulling RN, BSN Entered By: Redmond Pulling on 03/20/2023 14:00:16 -------------------------------------------------------------------------------- Pain Assessment Details Patient Name: Date of Service: Corey NES, MA RK [name]. 03/20/2023 12:30 PM Medical Record Number: [id number] Patient Account Number: [id number] Date of Birth/Sex: Treating RN: 1971/03/23 (52 y.o. Corey [name] Primary Care Dustin Bumbaugh: Gwinda Passe Other Clinician: Referring Farzad Tibbetts: Treating Cayci Mcnabb/Extender: Aurelio Brash in Treatment: 0 Active Problems Location of Pain Severity and Description of Pain Patient Has Paino Yes Site Locations Pain Location: Generalized Pain Rate the pain. Current Pain Level: 8 Character of Pain Describe the Pain: Aching, Sharp Pain Management and Medication Current Pain Management: Medication: Yes Electronic Signature(s) Signed: 03/20/2023 4:36:59 PM By: Redmond Pulling RN, BSN Entered By: Redmond Pulling on 03/20/2023 13:12:45 Corey [name] ([id number]) [id number] Page 7 of 10 -------------------------------------------------------------------------------- Patient/Caregiver Education Details Patient Name: Date of Service: [facility] [name], [place] RK [name]. 12/16/2024andnbsp12:30 PM Medical Record Number: [id number] Patient Account Number: [id number] Date of Birth/Gender: Treating RN: [birth year] (52 y.o. Corey [name] Primary Care Physician: Gwinda Passe Other Clinician: Referring Physician: Treating Physician/Extender: Aurelio Brash in Treatment: 0 Education Assessment Education Provided To: Patient Education Topics  Provided Welcome T [facility]-New Patient  Packet: o Methods: Explain/Verbal Responses: State content correctly Wound/Skin Impairment: Methods: Explain/Verbal Responses: State content correctly Electronic Signature(s) Signed: 03/20/2023 4:36:59 PM By: Redmond Pulling RN, BSN Entered By: Redmond Pulling on 03/20/2023 14:00:31 -------------------------------------------------------------------------------- Wound Assessment Details Patient Name: Date of Service: Corey NES, MA RK [name]. 03/20/2023 12:30 PM Medical Record Number: [id number] Patient Account Number: [id number] Date of Birth/Sex: Treating RN: 05/23/1970 (52 y.o. M) Primary Care Tmya Wigington: Gwinda Passe Other Clinician: Referring Haleem Hanner: Treating Alexsis Kathman/Extender: Aurelio Brash in Treatment: 0 Wound Status Wound Number: 1 Primary Open Surgical Wound Etiology: Wound Location: Left Amputation Site - Above Knee Wound Open Wounding Event: Gradually Appeared Status: Date Acquired: 11/17/2022 Comorbid Arrhythmia, Hypertension, Peripheral Arterial Disease, End Stage Weeks Of Treatment: 0 History: Renal Disease, Osteoarthritis Clustered Wound: No Photos Wound Measurements Length: (cm) 1.5 Corey [name], Corey [name] [name] ([id number]) Width: (cm) 3.5 Depth: (cm) 1 Area: (cm) 4.123 Volume: (cm) 4.123 % Reduction in Area: [id number] Page 8 of 10 % Reduction in Volume: Epithelialization: None Tunneling: No Undermining: No Wound Description Classification: Full Thickness Without Exposed Support Structures Exudate Amount: Medium Exudate Type: Serosanguineous Exudate Color: red, brown Foul Odor After Cleansing: No Slough/Fibrino Yes Wound Bed Granulation Amount: Medium (34-66%) Exposed Structure Granulation Quality: Red Fascia Exposed: No Necrotic Amount: Medium (34-66%) Fat Layer (Subcutaneous Tissue) Exposed: Yes Necrotic Quality: Adherent Slough Tendon Exposed: No Muscle Exposed: No Joint Exposed: No Bone Exposed:  No Periwound Skin Texture Texture Color No Abnormalities Noted: Yes No Abnormalities Noted: No Atrophie Blanche: No Moisture Cyanosis: No No Abnormalities Noted: No Ecchymosis: No Dry / Scaly: No Erythema: No Maceration: No Hemosiderin Staining: No Mottled: No Pallor: No Rubor: No Temperature / Pain Temperature: Hot Tenderness on Palpation: Yes Treatment Notes Wound #1 (Amputation Site - Above Knee) Wound Laterality: Left Cleanser Soap and Water Discharge Instruction: May shower and wash wound with dial antibacterial soap and water prior to dressing change. Vashe 5.8 (oz) Discharge Instruction: Cleanse the wound with Vashe prior to applying [name] clean dressing using gauze sponges, not tissue or cotton balls. Peri-Wound Care Skin Prep Discharge Instruction: Use skin prep as directed Topical Skintegrity Hydrogel 4 (oz) Discharge Instruction: Apply hydrogel as directed Primary Dressing Promogran Prisma Matrix, 4.34 (sq in) (silver collagen) Discharge Instruction: Moisten collagen with saline or hydrogel Secondary Dressing Woven Gauze Sponge, Non-Sterile 4x4 in Discharge Instruction: Apply over primary dressing as directed. Zetuvit Plus Silicone Border Dressing 4x4 (in/in) Discharge Instruction: Apply silicone border over primary dressing as directed. Secured With Compression Wrap Compression Stockings Facilities manager) Signed: 03/22/2023 5:28:18 PM By: Ronnald Ramp, Rusell [name] ([id number]) [id number] Page 9 of 10 Entered By: Thayer Dallas on 03/20/2023 13:08:46 -------------------------------------------------------------------------------- Wound Assessment Details Patient Name: Date of Service: Corey [name] [name], [place] RK [name]. 03/20/2023 12:30 PM Medical Record Number: [id number] Patient Account Number: [id number] Date of Birth/Sex: Treating RN: [birth year] (52 y.o. Corey [name] Primary Care Tremane Spurgeon: Gwinda Passe Other  Clinician: Referring Makaylie Dedeaux: Treating Jessa Stinson/Extender: Aurelio Brash in Treatment: 0 Wound Status Wound Number: 2 Primary Dehisced Wound Etiology: Wound Location: Left, Lateral Amputation Site - Above Knee Wound Open Wounding Event: Other Lesion Status: Date Acquired: 02/01/2023 Comorbid Arrhythmia, Hypertension, Peripheral Arterial Disease, End Stage Weeks Of Treatment: 0 History: Renal Disease, Osteoarthritis, Neuropathy Clustered Wound: No Wound Measurements Length: (cm) 1 Width: (cm) 0.2 Depth: (cm) 0.1 Area: (cm) 0.157 Volume: (cm) 0.016 % Reduction in Area: % Reduction in Volume: Epithelialization: None Tunneling: No Undermining: No Wound  Description Classification: Full Thickness Without Exposed Support Structures Exudate Amount: Medium Exudate Type: Serosanguineous Exudate Color: red, brown Foul Odor After Cleansing: No Slough/Fibrino Yes Wound Bed Granulation Amount: Medium (34-66%) Exposed Structure Granulation Quality: Red Fat Layer (Subcutaneous Tissue) Exposed: Yes Necrotic Amount: Medium (34-66%) Necrotic Quality: Adherent Slough Periwound Skin Texture Texture Color No Abnormalities Noted: No No Abnormalities Noted: No Moisture No Abnormalities Noted: No Treatment Notes Wound #2 (Amputation Site - Above Knee) Wound Laterality: Left, Lateral Cleanser Soap and Water Discharge Instruction: May shower and wash wound with dial antibacterial soap and water prior to dressing change. Vashe 5.8 (oz) Discharge Instruction: Cleanse the wound with Vashe prior to applying [name] clean dressing using gauze sponges, not tissue or cotton balls. Peri-Wound Care Skin Prep Discharge Instruction: Use skin prep as directed Topical Skintegrity Hydrogel 4 (oz) Discharge Instruction: Apply hydrogel as directed Primary Dressing Promogran Prisma Matrix, 4.34 (sq in) (silver collagen) Discharge Instruction: Moisten collagen with saline or  hydrogel [place], Jackson [name] ([id number]) [id number] Page 10 of 10 Secondary Dressing Zetuvit Plus Silicone Border Dressing 3x3 (in/in) Discharge Instruction: Apply silicone border over primary dressing as directed. Secured With Compression Wrap Compression Stockings Facilities manager) Signed: 03/20/2023 4:36:59 PM By: Redmond Pulling RN, BSN Entered By: Redmond Pulling on 03/20/2023 13:26:37 -------------------------------------------------------------------------------- Vitals Details Patient Name: Date of Service: Corey NES, MA RK [name]. 03/20/2023 12:30 PM Medical Record Number: [id number] Patient Account Number: [id number] Date of Birth/Sex: Treating RN: Apr 28, 1970 (52 y.o. Corey [name] Primary Care Trevyon Swor: Gwinda Passe Other Clinician: Referring Tram Wrenn: Treating Monserrat Vidaurri/Extender: Aurelio Brash in Treatment: 0 Vital Signs Time Taken: 12:45 Temperature (F): 98.3 Pulse (bpm): 70 Respiratory Rate (breaths/min): 18 Blood Pressure (mmHg): 116/72 Reference Range: 80 - 120 mg / dl Electronic Signature(s) Signed: 03/20/2023 4:36:59 PM By: Redmond Pulling RN, BSN Entered By: Redmond Pulling on 03/20/2023 13:58:54

## 2023-03-27 ENCOUNTER — Ambulatory Visit (HOSPITAL_BASED_OUTPATIENT_CLINIC_OR_DEPARTMENT_OTHER): Payer: 59 | Admitting: Internal Medicine

## 2023-03-31 ENCOUNTER — Ambulatory Visit (INDEPENDENT_AMBULATORY_CARE_PROVIDER_SITE_OTHER): Payer: Self-pay

## 2023-03-31 NOTE — Telephone Encounter (Signed)
  Chief Complaint: medication problem Symptoms: BKA pain Frequency:  Pertinent Negatives: Patient doesn't like the effects from Percocet, doesn't feel in control when taking them Disposition: [] ED /[] Urgent Care (no appt availability in office) / [] Appointment(In office/virtual)/ []  [place] [facility]/ [] Home Care/ [] Refused Recommended Disposition /[] [place] [facility]/ []  Follow-up with PCP Additional Notes: Tammy, LPN with [facility] calling in for pt, pt present with her. pt wanting to switch back to Tramadol for pain rather than Percocet. He tolerates Tramadol better. Dr. Haskell Riling prescribed Percocet with [facility], advised Tammy, LPN and pt to call their office to ask for switch. Pt states he has appt Monday will ask about it then.    Reason for Disposition  [1] Caller has URGENT medicine question about med that PCP or specialist prescribed AND [2] triager unable to answer question  Answer Assessment - Initial Assessment Questions 1. NAME of MEDICINE: "What medicine(s) are you calling about?"     Percocet and Tramadol  2. QUESTION: "What is your question?" (e.g., double dose of medicine, side effect)     Pt doesn't like the effects of Percocet and wanting to switch back to Tramadol that was previously prescribed in hospital for BKA 3. PRESCRIBER: "Who prescribed the medicine?" Reason: if prescribed by specialist, call should be referred to that group.     Tramadol last prescribed Pam Love PA 4. SYMPTOMS: "Do you have any symptoms?" If Yes, ask: "What symptoms are you having?"  "How bad are the symptoms (e.g., mild, moderate, severe)     BKA pain  Protocols used: Medication Question Call-A-AH

## 2023-04-03 ENCOUNTER — Ambulatory Visit: Payer: 59

## 2023-04-05 DIAGNOSIS — N186 End stage renal disease: Secondary | ICD-10-CM | POA: Diagnosis not present

## 2023-04-05 DIAGNOSIS — T8781 Dehiscence of amputation stump: Secondary | ICD-10-CM | POA: Diagnosis not present

## 2023-04-05 DIAGNOSIS — Z992 Dependence on renal dialysis: Secondary | ICD-10-CM | POA: Diagnosis not present

## 2023-04-05 DIAGNOSIS — Z7901 Long term (current) use of anticoagulants: Secondary | ICD-10-CM | POA: Diagnosis not present

## 2023-04-05 DIAGNOSIS — G629 Polyneuropathy, unspecified: Secondary | ICD-10-CM | POA: Diagnosis not present

## 2023-04-05 DIAGNOSIS — H5410 Blindness, one eye, low vision other eye, unspecified eyes: Secondary | ICD-10-CM | POA: Diagnosis not present

## 2023-04-05 DIAGNOSIS — T8744 Infection of amputation stump, left lower extremity: Secondary | ICD-10-CM | POA: Diagnosis not present

## 2023-04-05 DIAGNOSIS — Z993 Dependence on wheelchair: Secondary | ICD-10-CM | POA: Diagnosis not present

## 2023-04-05 DIAGNOSIS — I120 Hypertensive chronic kidney disease with stage 5 chronic kidney disease or end stage renal disease: Secondary | ICD-10-CM | POA: Diagnosis not present

## 2023-04-05 DIAGNOSIS — I480 Paroxysmal atrial fibrillation: Secondary | ICD-10-CM | POA: Diagnosis not present

## 2023-04-07 DIAGNOSIS — H5410 Blindness, one eye, low vision other eye, unspecified eyes: Secondary | ICD-10-CM | POA: Diagnosis not present

## 2023-04-07 DIAGNOSIS — L89893 Pressure ulcer of other site, stage 3: Secondary | ICD-10-CM | POA: Diagnosis not present

## 2023-04-07 DIAGNOSIS — Z7901 Long term (current) use of anticoagulants: Secondary | ICD-10-CM | POA: Diagnosis not present

## 2023-04-07 DIAGNOSIS — T8744 Infection of amputation stump, left lower extremity: Secondary | ICD-10-CM | POA: Diagnosis not present

## 2023-04-07 DIAGNOSIS — N186 End stage renal disease: Secondary | ICD-10-CM | POA: Diagnosis not present

## 2023-04-07 DIAGNOSIS — G629 Polyneuropathy, unspecified: Secondary | ICD-10-CM | POA: Diagnosis not present

## 2023-04-07 DIAGNOSIS — I480 Paroxysmal atrial fibrillation: Secondary | ICD-10-CM | POA: Diagnosis not present

## 2023-04-07 DIAGNOSIS — T8781 Dehiscence of amputation stump: Secondary | ICD-10-CM | POA: Diagnosis not present

## 2023-04-07 DIAGNOSIS — Z992 Dependence on renal dialysis: Secondary | ICD-10-CM | POA: Diagnosis not present

## 2023-04-07 DIAGNOSIS — Z993 Dependence on wheelchair: Secondary | ICD-10-CM | POA: Diagnosis not present

## 2023-04-07 DIAGNOSIS — I120 Hypertensive chronic kidney disease with stage 5 chronic kidney disease or end stage renal disease: Secondary | ICD-10-CM | POA: Diagnosis not present

## 2023-04-08 DIAGNOSIS — Z992 Dependence on renal dialysis: Secondary | ICD-10-CM | POA: Diagnosis not present

## 2023-04-08 DIAGNOSIS — D689 Coagulation defect, unspecified: Secondary | ICD-10-CM | POA: Diagnosis not present

## 2023-04-08 DIAGNOSIS — T8249XD Other complication of vascular dialysis catheter, subsequent encounter: Secondary | ICD-10-CM | POA: Diagnosis not present

## 2023-04-08 DIAGNOSIS — N186 End stage renal disease: Secondary | ICD-10-CM | POA: Diagnosis not present

## 2023-04-08 DIAGNOSIS — D509 Iron deficiency anemia, unspecified: Secondary | ICD-10-CM | POA: Diagnosis not present

## 2023-04-08 DIAGNOSIS — D631 Anemia in chronic kidney disease: Secondary | ICD-10-CM | POA: Diagnosis not present

## 2023-04-08 DIAGNOSIS — N2581 Secondary hyperparathyroidism of renal origin: Secondary | ICD-10-CM | POA: Diagnosis not present

## 2023-04-10 ENCOUNTER — Ambulatory Visit (HOSPITAL_BASED_OUTPATIENT_CLINIC_OR_DEPARTMENT_OTHER): Payer: 59 | Admitting: Internal Medicine

## 2023-04-10 DIAGNOSIS — H5410 Blindness, one eye, low vision other eye, unspecified eyes: Secondary | ICD-10-CM | POA: Diagnosis not present

## 2023-04-10 DIAGNOSIS — I120 Hypertensive chronic kidney disease with stage 5 chronic kidney disease or end stage renal disease: Secondary | ICD-10-CM | POA: Diagnosis not present

## 2023-04-10 DIAGNOSIS — T8781 Dehiscence of amputation stump: Secondary | ICD-10-CM | POA: Diagnosis not present

## 2023-04-10 DIAGNOSIS — I480 Paroxysmal atrial fibrillation: Secondary | ICD-10-CM | POA: Diagnosis not present

## 2023-04-10 DIAGNOSIS — T8744 Infection of amputation stump, left lower extremity: Secondary | ICD-10-CM | POA: Diagnosis not present

## 2023-04-10 DIAGNOSIS — N186 End stage renal disease: Secondary | ICD-10-CM | POA: Diagnosis not present

## 2023-04-10 DIAGNOSIS — G629 Polyneuropathy, unspecified: Secondary | ICD-10-CM | POA: Diagnosis not present

## 2023-04-10 DIAGNOSIS — Z7901 Long term (current) use of anticoagulants: Secondary | ICD-10-CM | POA: Diagnosis not present

## 2023-04-10 DIAGNOSIS — Z992 Dependence on renal dialysis: Secondary | ICD-10-CM | POA: Diagnosis not present

## 2023-04-10 DIAGNOSIS — Z993 Dependence on wheelchair: Secondary | ICD-10-CM | POA: Diagnosis not present

## 2023-04-13 DIAGNOSIS — N2581 Secondary hyperparathyroidism of renal origin: Secondary | ICD-10-CM | POA: Diagnosis not present

## 2023-04-13 DIAGNOSIS — D509 Iron deficiency anemia, unspecified: Secondary | ICD-10-CM | POA: Diagnosis not present

## 2023-04-13 DIAGNOSIS — T8249XD Other complication of vascular dialysis catheter, subsequent encounter: Secondary | ICD-10-CM | POA: Diagnosis not present

## 2023-04-13 DIAGNOSIS — Z992 Dependence on renal dialysis: Secondary | ICD-10-CM | POA: Diagnosis not present

## 2023-04-13 DIAGNOSIS — N186 End stage renal disease: Secondary | ICD-10-CM | POA: Diagnosis not present

## 2023-04-13 DIAGNOSIS — D689 Coagulation defect, unspecified: Secondary | ICD-10-CM | POA: Diagnosis not present

## 2023-04-13 DIAGNOSIS — D631 Anemia in chronic kidney disease: Secondary | ICD-10-CM | POA: Diagnosis not present

## 2023-04-14 ENCOUNTER — Other Ambulatory Visit (INDEPENDENT_AMBULATORY_CARE_PROVIDER_SITE_OTHER): Payer: Self-pay | Admitting: Primary Care

## 2023-04-14 DIAGNOSIS — Z993 Dependence on wheelchair: Secondary | ICD-10-CM | POA: Diagnosis not present

## 2023-04-14 DIAGNOSIS — G629 Polyneuropathy, unspecified: Secondary | ICD-10-CM | POA: Diagnosis not present

## 2023-04-14 DIAGNOSIS — I480 Paroxysmal atrial fibrillation: Secondary | ICD-10-CM | POA: Diagnosis not present

## 2023-04-14 DIAGNOSIS — I96 Gangrene, not elsewhere classified: Secondary | ICD-10-CM | POA: Diagnosis not present

## 2023-04-14 DIAGNOSIS — Z7901 Long term (current) use of anticoagulants: Secondary | ICD-10-CM | POA: Diagnosis not present

## 2023-04-14 DIAGNOSIS — N186 End stage renal disease: Secondary | ICD-10-CM | POA: Diagnosis not present

## 2023-04-14 DIAGNOSIS — I5032 Chronic diastolic (congestive) heart failure: Secondary | ICD-10-CM | POA: Diagnosis not present

## 2023-04-14 DIAGNOSIS — Z992 Dependence on renal dialysis: Secondary | ICD-10-CM | POA: Diagnosis not present

## 2023-04-14 DIAGNOSIS — T8744 Infection of amputation stump, left lower extremity: Secondary | ICD-10-CM | POA: Diagnosis not present

## 2023-04-14 DIAGNOSIS — I120 Hypertensive chronic kidney disease with stage 5 chronic kidney disease or end stage renal disease: Secondary | ICD-10-CM | POA: Diagnosis not present

## 2023-04-14 DIAGNOSIS — T8781 Dehiscence of amputation stump: Secondary | ICD-10-CM | POA: Diagnosis not present

## 2023-04-14 DIAGNOSIS — Z89612 Acquired absence of left leg above knee: Secondary | ICD-10-CM | POA: Diagnosis not present

## 2023-04-14 DIAGNOSIS — H5410 Blindness, one eye, low vision other eye, unspecified eyes: Secondary | ICD-10-CM | POA: Diagnosis not present

## 2023-04-18 DIAGNOSIS — Z992 Dependence on renal dialysis: Secondary | ICD-10-CM | POA: Diagnosis not present

## 2023-04-18 DIAGNOSIS — N186 End stage renal disease: Secondary | ICD-10-CM | POA: Diagnosis not present

## 2023-04-18 DIAGNOSIS — N2581 Secondary hyperparathyroidism of renal origin: Secondary | ICD-10-CM | POA: Diagnosis not present

## 2023-04-18 DIAGNOSIS — D631 Anemia in chronic kidney disease: Secondary | ICD-10-CM | POA: Diagnosis not present

## 2023-04-18 DIAGNOSIS — L89893 Pressure ulcer of other site, stage 3: Secondary | ICD-10-CM | POA: Diagnosis not present

## 2023-04-18 DIAGNOSIS — D509 Iron deficiency anemia, unspecified: Secondary | ICD-10-CM | POA: Diagnosis not present

## 2023-04-18 DIAGNOSIS — T8249XD Other complication of vascular dialysis catheter, subsequent encounter: Secondary | ICD-10-CM | POA: Diagnosis not present

## 2023-04-18 DIAGNOSIS — D689 Coagulation defect, unspecified: Secondary | ICD-10-CM | POA: Diagnosis not present

## 2023-04-19 DIAGNOSIS — H5410 Blindness, one eye, low vision other eye, unspecified eyes: Secondary | ICD-10-CM | POA: Diagnosis not present

## 2023-04-19 DIAGNOSIS — Z992 Dependence on renal dialysis: Secondary | ICD-10-CM | POA: Diagnosis not present

## 2023-04-19 DIAGNOSIS — T8781 Dehiscence of amputation stump: Secondary | ICD-10-CM | POA: Diagnosis not present

## 2023-04-19 DIAGNOSIS — Z7901 Long term (current) use of anticoagulants: Secondary | ICD-10-CM | POA: Diagnosis not present

## 2023-04-19 DIAGNOSIS — G629 Polyneuropathy, unspecified: Secondary | ICD-10-CM | POA: Diagnosis not present

## 2023-04-19 DIAGNOSIS — Z993 Dependence on wheelchair: Secondary | ICD-10-CM | POA: Diagnosis not present

## 2023-04-19 DIAGNOSIS — N186 End stage renal disease: Secondary | ICD-10-CM | POA: Diagnosis not present

## 2023-04-19 DIAGNOSIS — I480 Paroxysmal atrial fibrillation: Secondary | ICD-10-CM | POA: Diagnosis not present

## 2023-04-19 DIAGNOSIS — T8744 Infection of amputation stump, left lower extremity: Secondary | ICD-10-CM | POA: Diagnosis not present

## 2023-04-19 DIAGNOSIS — I120 Hypertensive chronic kidney disease with stage 5 chronic kidney disease or end stage renal disease: Secondary | ICD-10-CM | POA: Diagnosis not present

## 2023-04-20 DIAGNOSIS — Z89612 Acquired absence of left leg above knee: Secondary | ICD-10-CM | POA: Diagnosis not present

## 2023-04-20 DIAGNOSIS — I96 Gangrene, not elsewhere classified: Secondary | ICD-10-CM | POA: Diagnosis not present

## 2023-04-21 DIAGNOSIS — H5410 Blindness, one eye, low vision other eye, unspecified eyes: Secondary | ICD-10-CM | POA: Diagnosis not present

## 2023-04-21 DIAGNOSIS — G629 Polyneuropathy, unspecified: Secondary | ICD-10-CM | POA: Diagnosis not present

## 2023-04-21 DIAGNOSIS — Z7901 Long term (current) use of anticoagulants: Secondary | ICD-10-CM | POA: Diagnosis not present

## 2023-04-21 DIAGNOSIS — T8744 Infection of amputation stump, left lower extremity: Secondary | ICD-10-CM | POA: Diagnosis not present

## 2023-04-21 DIAGNOSIS — Z993 Dependence on wheelchair: Secondary | ICD-10-CM | POA: Diagnosis not present

## 2023-04-21 DIAGNOSIS — I480 Paroxysmal atrial fibrillation: Secondary | ICD-10-CM | POA: Diagnosis not present

## 2023-04-21 DIAGNOSIS — Z992 Dependence on renal dialysis: Secondary | ICD-10-CM | POA: Diagnosis not present

## 2023-04-21 DIAGNOSIS — I120 Hypertensive chronic kidney disease with stage 5 chronic kidney disease or end stage renal disease: Secondary | ICD-10-CM | POA: Diagnosis not present

## 2023-04-21 DIAGNOSIS — T8781 Dehiscence of amputation stump: Secondary | ICD-10-CM | POA: Diagnosis not present

## 2023-04-21 DIAGNOSIS — N186 End stage renal disease: Secondary | ICD-10-CM | POA: Diagnosis not present

## 2023-04-22 DIAGNOSIS — G8929 Other chronic pain: Secondary | ICD-10-CM | POA: Diagnosis not present

## 2023-04-22 DIAGNOSIS — T8249XD Other complication of vascular dialysis catheter, subsequent encounter: Secondary | ICD-10-CM | POA: Diagnosis not present

## 2023-04-22 DIAGNOSIS — D509 Iron deficiency anemia, unspecified: Secondary | ICD-10-CM | POA: Diagnosis not present

## 2023-04-22 DIAGNOSIS — N2581 Secondary hyperparathyroidism of renal origin: Secondary | ICD-10-CM | POA: Diagnosis not present

## 2023-04-22 DIAGNOSIS — D631 Anemia in chronic kidney disease: Secondary | ICD-10-CM | POA: Diagnosis not present

## 2023-04-22 DIAGNOSIS — N186 End stage renal disease: Secondary | ICD-10-CM | POA: Diagnosis not present

## 2023-04-22 DIAGNOSIS — Z992 Dependence on renal dialysis: Secondary | ICD-10-CM | POA: Diagnosis not present

## 2023-04-22 DIAGNOSIS — D689 Coagulation defect, unspecified: Secondary | ICD-10-CM | POA: Diagnosis not present

## 2023-04-22 DIAGNOSIS — M19041 Primary osteoarthritis, right hand: Secondary | ICD-10-CM | POA: Diagnosis not present

## 2023-04-26 DIAGNOSIS — Z7901 Long term (current) use of anticoagulants: Secondary | ICD-10-CM | POA: Diagnosis not present

## 2023-04-26 DIAGNOSIS — I120 Hypertensive chronic kidney disease with stage 5 chronic kidney disease or end stage renal disease: Secondary | ICD-10-CM | POA: Diagnosis not present

## 2023-04-26 DIAGNOSIS — Z993 Dependence on wheelchair: Secondary | ICD-10-CM | POA: Diagnosis not present

## 2023-04-26 DIAGNOSIS — N186 End stage renal disease: Secondary | ICD-10-CM | POA: Diagnosis not present

## 2023-04-26 DIAGNOSIS — I480 Paroxysmal atrial fibrillation: Secondary | ICD-10-CM | POA: Diagnosis not present

## 2023-04-26 DIAGNOSIS — T8744 Infection of amputation stump, left lower extremity: Secondary | ICD-10-CM | POA: Diagnosis not present

## 2023-04-26 DIAGNOSIS — H5410 Blindness, one eye, low vision other eye, unspecified eyes: Secondary | ICD-10-CM | POA: Diagnosis not present

## 2023-04-26 DIAGNOSIS — G629 Polyneuropathy, unspecified: Secondary | ICD-10-CM | POA: Diagnosis not present

## 2023-04-26 DIAGNOSIS — Z992 Dependence on renal dialysis: Secondary | ICD-10-CM | POA: Diagnosis not present

## 2023-04-26 DIAGNOSIS — T8781 Dehiscence of amputation stump: Secondary | ICD-10-CM | POA: Diagnosis not present

## 2023-04-27 DIAGNOSIS — Z992 Dependence on renal dialysis: Secondary | ICD-10-CM | POA: Diagnosis not present

## 2023-04-27 DIAGNOSIS — D509 Iron deficiency anemia, unspecified: Secondary | ICD-10-CM | POA: Diagnosis not present

## 2023-04-27 DIAGNOSIS — D689 Coagulation defect, unspecified: Secondary | ICD-10-CM | POA: Diagnosis not present

## 2023-04-27 DIAGNOSIS — D631 Anemia in chronic kidney disease: Secondary | ICD-10-CM | POA: Diagnosis not present

## 2023-04-27 DIAGNOSIS — N2581 Secondary hyperparathyroidism of renal origin: Secondary | ICD-10-CM | POA: Diagnosis not present

## 2023-04-27 DIAGNOSIS — N186 End stage renal disease: Secondary | ICD-10-CM | POA: Diagnosis not present

## 2023-04-27 DIAGNOSIS — T8249XD Other complication of vascular dialysis catheter, subsequent encounter: Secondary | ICD-10-CM | POA: Diagnosis not present

## 2023-04-28 DIAGNOSIS — T8744 Infection of amputation stump, left lower extremity: Secondary | ICD-10-CM | POA: Diagnosis not present

## 2023-04-28 DIAGNOSIS — Z7901 Long term (current) use of anticoagulants: Secondary | ICD-10-CM | POA: Diagnosis not present

## 2023-04-28 DIAGNOSIS — H5410 Blindness, one eye, low vision other eye, unspecified eyes: Secondary | ICD-10-CM | POA: Diagnosis not present

## 2023-04-28 DIAGNOSIS — N186 End stage renal disease: Secondary | ICD-10-CM | POA: Diagnosis not present

## 2023-04-28 DIAGNOSIS — T8781 Dehiscence of amputation stump: Secondary | ICD-10-CM | POA: Diagnosis not present

## 2023-04-28 DIAGNOSIS — Z992 Dependence on renal dialysis: Secondary | ICD-10-CM | POA: Diagnosis not present

## 2023-04-28 DIAGNOSIS — I480 Paroxysmal atrial fibrillation: Secondary | ICD-10-CM | POA: Diagnosis not present

## 2023-04-28 DIAGNOSIS — L89893 Pressure ulcer of other site, stage 3: Secondary | ICD-10-CM | POA: Diagnosis not present

## 2023-04-28 DIAGNOSIS — I120 Hypertensive chronic kidney disease with stage 5 chronic kidney disease or end stage renal disease: Secondary | ICD-10-CM | POA: Diagnosis not present

## 2023-04-28 DIAGNOSIS — G629 Polyneuropathy, unspecified: Secondary | ICD-10-CM | POA: Diagnosis not present

## 2023-04-28 DIAGNOSIS — Z993 Dependence on wheelchair: Secondary | ICD-10-CM | POA: Diagnosis not present

## 2023-05-02 DIAGNOSIS — D631 Anemia in chronic kidney disease: Secondary | ICD-10-CM | POA: Diagnosis not present

## 2023-05-02 DIAGNOSIS — N186 End stage renal disease: Secondary | ICD-10-CM | POA: Diagnosis not present

## 2023-05-02 DIAGNOSIS — D689 Coagulation defect, unspecified: Secondary | ICD-10-CM | POA: Diagnosis not present

## 2023-05-02 DIAGNOSIS — Z992 Dependence on renal dialysis: Secondary | ICD-10-CM | POA: Diagnosis not present

## 2023-05-02 DIAGNOSIS — D509 Iron deficiency anemia, unspecified: Secondary | ICD-10-CM | POA: Diagnosis not present

## 2023-05-02 DIAGNOSIS — T8249XD Other complication of vascular dialysis catheter, subsequent encounter: Secondary | ICD-10-CM | POA: Diagnosis not present

## 2023-05-02 DIAGNOSIS — N2581 Secondary hyperparathyroidism of renal origin: Secondary | ICD-10-CM | POA: Diagnosis not present

## 2023-05-03 DIAGNOSIS — G629 Polyneuropathy, unspecified: Secondary | ICD-10-CM | POA: Diagnosis not present

## 2023-05-03 DIAGNOSIS — I480 Paroxysmal atrial fibrillation: Secondary | ICD-10-CM | POA: Diagnosis not present

## 2023-05-03 DIAGNOSIS — H5410 Blindness, one eye, low vision other eye, unspecified eyes: Secondary | ICD-10-CM | POA: Diagnosis not present

## 2023-05-03 DIAGNOSIS — T8744 Infection of amputation stump, left lower extremity: Secondary | ICD-10-CM | POA: Diagnosis not present

## 2023-05-03 DIAGNOSIS — I120 Hypertensive chronic kidney disease with stage 5 chronic kidney disease or end stage renal disease: Secondary | ICD-10-CM | POA: Diagnosis not present

## 2023-05-03 DIAGNOSIS — Z7901 Long term (current) use of anticoagulants: Secondary | ICD-10-CM | POA: Diagnosis not present

## 2023-05-03 DIAGNOSIS — Z992 Dependence on renal dialysis: Secondary | ICD-10-CM | POA: Diagnosis not present

## 2023-05-03 DIAGNOSIS — T8781 Dehiscence of amputation stump: Secondary | ICD-10-CM | POA: Diagnosis not present

## 2023-05-03 DIAGNOSIS — N186 End stage renal disease: Secondary | ICD-10-CM | POA: Diagnosis not present

## 2023-05-03 DIAGNOSIS — Z993 Dependence on wheelchair: Secondary | ICD-10-CM | POA: Diagnosis not present

## 2023-05-03 NOTE — Telephone Encounter (Signed)
error

## 2023-05-05 DIAGNOSIS — H5410 Blindness, one eye, low vision other eye, unspecified eyes: Secondary | ICD-10-CM | POA: Diagnosis not present

## 2023-05-05 DIAGNOSIS — I120 Hypertensive chronic kidney disease with stage 5 chronic kidney disease or end stage renal disease: Secondary | ICD-10-CM | POA: Diagnosis not present

## 2023-05-05 DIAGNOSIS — Z993 Dependence on wheelchair: Secondary | ICD-10-CM | POA: Diagnosis not present

## 2023-05-05 DIAGNOSIS — I480 Paroxysmal atrial fibrillation: Secondary | ICD-10-CM | POA: Diagnosis not present

## 2023-05-05 DIAGNOSIS — Z7901 Long term (current) use of anticoagulants: Secondary | ICD-10-CM | POA: Diagnosis not present

## 2023-05-05 DIAGNOSIS — T8781 Dehiscence of amputation stump: Secondary | ICD-10-CM | POA: Diagnosis not present

## 2023-05-05 DIAGNOSIS — T8744 Infection of amputation stump, left lower extremity: Secondary | ICD-10-CM | POA: Diagnosis not present

## 2023-05-05 DIAGNOSIS — N186 End stage renal disease: Secondary | ICD-10-CM | POA: Diagnosis not present

## 2023-05-05 DIAGNOSIS — Z992 Dependence on renal dialysis: Secondary | ICD-10-CM | POA: Diagnosis not present

## 2023-05-05 DIAGNOSIS — I129 Hypertensive chronic kidney disease with stage 1 through stage 4 chronic kidney disease, or unspecified chronic kidney disease: Secondary | ICD-10-CM | POA: Diagnosis not present

## 2023-05-05 DIAGNOSIS — G629 Polyneuropathy, unspecified: Secondary | ICD-10-CM | POA: Diagnosis not present

## 2023-05-06 DIAGNOSIS — D631 Anemia in chronic kidney disease: Secondary | ICD-10-CM | POA: Diagnosis not present

## 2023-05-06 DIAGNOSIS — Z992 Dependence on renal dialysis: Secondary | ICD-10-CM | POA: Diagnosis not present

## 2023-05-06 DIAGNOSIS — D509 Iron deficiency anemia, unspecified: Secondary | ICD-10-CM | POA: Diagnosis not present

## 2023-05-06 DIAGNOSIS — T8249XD Other complication of vascular dialysis catheter, subsequent encounter: Secondary | ICD-10-CM | POA: Diagnosis not present

## 2023-05-06 DIAGNOSIS — D689 Coagulation defect, unspecified: Secondary | ICD-10-CM | POA: Diagnosis not present

## 2023-05-06 DIAGNOSIS — N186 End stage renal disease: Secondary | ICD-10-CM | POA: Diagnosis not present

## 2023-05-06 DIAGNOSIS — N2581 Secondary hyperparathyroidism of renal origin: Secondary | ICD-10-CM | POA: Diagnosis not present

## 2023-05-08 ENCOUNTER — Encounter (HOSPITAL_BASED_OUTPATIENT_CLINIC_OR_DEPARTMENT_OTHER): Payer: 59 | Attending: Internal Medicine | Admitting: Internal Medicine

## 2023-05-08 DIAGNOSIS — Z89612 Acquired absence of left leg above knee: Secondary | ICD-10-CM

## 2023-05-08 DIAGNOSIS — I132 Hypertensive heart and chronic kidney disease with heart failure and with stage 5 chronic kidney disease, or end stage renal disease: Secondary | ICD-10-CM | POA: Insufficient documentation

## 2023-05-08 DIAGNOSIS — L97828 Non-pressure chronic ulcer of other part of left lower leg with other specified severity: Secondary | ICD-10-CM | POA: Diagnosis not present

## 2023-05-08 DIAGNOSIS — I87311 Chronic venous hypertension (idiopathic) with ulcer of right lower extremity: Secondary | ICD-10-CM | POA: Insufficient documentation

## 2023-05-08 DIAGNOSIS — Z4781 Encounter for orthopedic aftercare following surgical amputation: Secondary | ICD-10-CM | POA: Insufficient documentation

## 2023-05-09 DIAGNOSIS — D631 Anemia in chronic kidney disease: Secondary | ICD-10-CM | POA: Diagnosis not present

## 2023-05-09 DIAGNOSIS — D689 Coagulation defect, unspecified: Secondary | ICD-10-CM | POA: Diagnosis not present

## 2023-05-09 DIAGNOSIS — D509 Iron deficiency anemia, unspecified: Secondary | ICD-10-CM | POA: Diagnosis not present

## 2023-05-09 DIAGNOSIS — T8249XD Other complication of vascular dialysis catheter, subsequent encounter: Secondary | ICD-10-CM | POA: Diagnosis not present

## 2023-05-09 DIAGNOSIS — Z992 Dependence on renal dialysis: Secondary | ICD-10-CM | POA: Diagnosis not present

## 2023-05-09 DIAGNOSIS — N186 End stage renal disease: Secondary | ICD-10-CM | POA: Diagnosis not present

## 2023-05-09 DIAGNOSIS — N2581 Secondary hyperparathyroidism of renal origin: Secondary | ICD-10-CM | POA: Diagnosis not present

## 2023-05-10 DIAGNOSIS — Z992 Dependence on renal dialysis: Secondary | ICD-10-CM | POA: Diagnosis not present

## 2023-05-10 DIAGNOSIS — H5410 Blindness, one eye, low vision other eye, unspecified eyes: Secondary | ICD-10-CM | POA: Diagnosis not present

## 2023-05-10 DIAGNOSIS — I120 Hypertensive chronic kidney disease with stage 5 chronic kidney disease or end stage renal disease: Secondary | ICD-10-CM | POA: Diagnosis not present

## 2023-05-10 DIAGNOSIS — N186 End stage renal disease: Secondary | ICD-10-CM | POA: Diagnosis not present

## 2023-05-10 DIAGNOSIS — T8744 Infection of amputation stump, left lower extremity: Secondary | ICD-10-CM | POA: Diagnosis not present

## 2023-05-10 DIAGNOSIS — G629 Polyneuropathy, unspecified: Secondary | ICD-10-CM | POA: Diagnosis not present

## 2023-05-10 DIAGNOSIS — Z993 Dependence on wheelchair: Secondary | ICD-10-CM | POA: Diagnosis not present

## 2023-05-10 DIAGNOSIS — I480 Paroxysmal atrial fibrillation: Secondary | ICD-10-CM | POA: Diagnosis not present

## 2023-05-10 DIAGNOSIS — T8781 Dehiscence of amputation stump: Secondary | ICD-10-CM | POA: Diagnosis not present

## 2023-05-10 DIAGNOSIS — Z7901 Long term (current) use of anticoagulants: Secondary | ICD-10-CM | POA: Diagnosis not present

## 2023-05-11 DIAGNOSIS — S78122A Partial traumatic amputation at level between left hip and knee, initial encounter: Secondary | ICD-10-CM | POA: Diagnosis not present

## 2023-05-11 DIAGNOSIS — S81002A Unspecified open wound, left knee, initial encounter: Secondary | ICD-10-CM | POA: Diagnosis not present

## 2023-05-12 DIAGNOSIS — T8781 Dehiscence of amputation stump: Secondary | ICD-10-CM | POA: Diagnosis not present

## 2023-05-12 DIAGNOSIS — H5410 Blindness, one eye, low vision other eye, unspecified eyes: Secondary | ICD-10-CM | POA: Diagnosis not present

## 2023-05-12 DIAGNOSIS — Z993 Dependence on wheelchair: Secondary | ICD-10-CM | POA: Diagnosis not present

## 2023-05-12 DIAGNOSIS — I120 Hypertensive chronic kidney disease with stage 5 chronic kidney disease or end stage renal disease: Secondary | ICD-10-CM | POA: Diagnosis not present

## 2023-05-12 DIAGNOSIS — N186 End stage renal disease: Secondary | ICD-10-CM | POA: Diagnosis not present

## 2023-05-12 DIAGNOSIS — I480 Paroxysmal atrial fibrillation: Secondary | ICD-10-CM | POA: Diagnosis not present

## 2023-05-12 DIAGNOSIS — Z7901 Long term (current) use of anticoagulants: Secondary | ICD-10-CM | POA: Diagnosis not present

## 2023-05-12 DIAGNOSIS — G629 Polyneuropathy, unspecified: Secondary | ICD-10-CM | POA: Diagnosis not present

## 2023-05-12 DIAGNOSIS — T8744 Infection of amputation stump, left lower extremity: Secondary | ICD-10-CM | POA: Diagnosis not present

## 2023-05-12 DIAGNOSIS — Z992 Dependence on renal dialysis: Secondary | ICD-10-CM | POA: Diagnosis not present

## 2023-05-13 DIAGNOSIS — N2581 Secondary hyperparathyroidism of renal origin: Secondary | ICD-10-CM | POA: Diagnosis not present

## 2023-05-13 DIAGNOSIS — D689 Coagulation defect, unspecified: Secondary | ICD-10-CM | POA: Diagnosis not present

## 2023-05-13 DIAGNOSIS — D509 Iron deficiency anemia, unspecified: Secondary | ICD-10-CM | POA: Diagnosis not present

## 2023-05-13 DIAGNOSIS — Z992 Dependence on renal dialysis: Secondary | ICD-10-CM | POA: Diagnosis not present

## 2023-05-13 DIAGNOSIS — D631 Anemia in chronic kidney disease: Secondary | ICD-10-CM | POA: Diagnosis not present

## 2023-05-13 DIAGNOSIS — N186 End stage renal disease: Secondary | ICD-10-CM | POA: Diagnosis not present

## 2023-05-13 DIAGNOSIS — T8249XD Other complication of vascular dialysis catheter, subsequent encounter: Secondary | ICD-10-CM | POA: Diagnosis not present

## 2023-05-15 DIAGNOSIS — I5032 Chronic diastolic (congestive) heart failure: Secondary | ICD-10-CM | POA: Diagnosis not present

## 2023-05-15 DIAGNOSIS — I96 Gangrene, not elsewhere classified: Secondary | ICD-10-CM | POA: Diagnosis not present

## 2023-05-15 DIAGNOSIS — Z89612 Acquired absence of left leg above knee: Secondary | ICD-10-CM | POA: Diagnosis not present

## 2023-05-16 DIAGNOSIS — N2581 Secondary hyperparathyroidism of renal origin: Secondary | ICD-10-CM | POA: Diagnosis not present

## 2023-05-16 DIAGNOSIS — N186 End stage renal disease: Secondary | ICD-10-CM | POA: Diagnosis not present

## 2023-05-16 DIAGNOSIS — Z992 Dependence on renal dialysis: Secondary | ICD-10-CM | POA: Diagnosis not present

## 2023-05-16 DIAGNOSIS — D631 Anemia in chronic kidney disease: Secondary | ICD-10-CM | POA: Diagnosis not present

## 2023-05-16 DIAGNOSIS — T8249XD Other complication of vascular dialysis catheter, subsequent encounter: Secondary | ICD-10-CM | POA: Diagnosis not present

## 2023-05-16 DIAGNOSIS — D689 Coagulation defect, unspecified: Secondary | ICD-10-CM | POA: Diagnosis not present

## 2023-05-16 DIAGNOSIS — D509 Iron deficiency anemia, unspecified: Secondary | ICD-10-CM | POA: Diagnosis not present

## 2023-05-17 DIAGNOSIS — H5410 Blindness, one eye, low vision other eye, unspecified eyes: Secondary | ICD-10-CM | POA: Diagnosis not present

## 2023-05-17 DIAGNOSIS — Z992 Dependence on renal dialysis: Secondary | ICD-10-CM | POA: Diagnosis not present

## 2023-05-17 DIAGNOSIS — N186 End stage renal disease: Secondary | ICD-10-CM | POA: Diagnosis not present

## 2023-05-17 DIAGNOSIS — I120 Hypertensive chronic kidney disease with stage 5 chronic kidney disease or end stage renal disease: Secondary | ICD-10-CM | POA: Diagnosis not present

## 2023-05-17 DIAGNOSIS — Z7901 Long term (current) use of anticoagulants: Secondary | ICD-10-CM | POA: Diagnosis not present

## 2023-05-17 DIAGNOSIS — T8781 Dehiscence of amputation stump: Secondary | ICD-10-CM | POA: Diagnosis not present

## 2023-05-17 DIAGNOSIS — I480 Paroxysmal atrial fibrillation: Secondary | ICD-10-CM | POA: Diagnosis not present

## 2023-05-17 DIAGNOSIS — G629 Polyneuropathy, unspecified: Secondary | ICD-10-CM | POA: Diagnosis not present

## 2023-05-17 DIAGNOSIS — Z993 Dependence on wheelchair: Secondary | ICD-10-CM | POA: Diagnosis not present

## 2023-05-17 DIAGNOSIS — T8744 Infection of amputation stump, left lower extremity: Secondary | ICD-10-CM | POA: Diagnosis not present

## 2023-05-19 DIAGNOSIS — N186 End stage renal disease: Secondary | ICD-10-CM | POA: Diagnosis not present

## 2023-05-19 DIAGNOSIS — Z993 Dependence on wheelchair: Secondary | ICD-10-CM | POA: Diagnosis not present

## 2023-05-19 DIAGNOSIS — Z7901 Long term (current) use of anticoagulants: Secondary | ICD-10-CM | POA: Diagnosis not present

## 2023-05-19 DIAGNOSIS — I120 Hypertensive chronic kidney disease with stage 5 chronic kidney disease or end stage renal disease: Secondary | ICD-10-CM | POA: Diagnosis not present

## 2023-05-19 DIAGNOSIS — T8744 Infection of amputation stump, left lower extremity: Secondary | ICD-10-CM | POA: Diagnosis not present

## 2023-05-19 DIAGNOSIS — H5410 Blindness, one eye, low vision other eye, unspecified eyes: Secondary | ICD-10-CM | POA: Diagnosis not present

## 2023-05-19 DIAGNOSIS — I480 Paroxysmal atrial fibrillation: Secondary | ICD-10-CM | POA: Diagnosis not present

## 2023-05-19 DIAGNOSIS — Z992 Dependence on renal dialysis: Secondary | ICD-10-CM | POA: Diagnosis not present

## 2023-05-19 DIAGNOSIS — T8781 Dehiscence of amputation stump: Secondary | ICD-10-CM | POA: Diagnosis not present

## 2023-05-19 DIAGNOSIS — G629 Polyneuropathy, unspecified: Secondary | ICD-10-CM | POA: Diagnosis not present

## 2023-05-20 DIAGNOSIS — Z992 Dependence on renal dialysis: Secondary | ICD-10-CM | POA: Diagnosis not present

## 2023-05-20 DIAGNOSIS — D689 Coagulation defect, unspecified: Secondary | ICD-10-CM | POA: Diagnosis not present

## 2023-05-20 DIAGNOSIS — D631 Anemia in chronic kidney disease: Secondary | ICD-10-CM | POA: Diagnosis not present

## 2023-05-20 DIAGNOSIS — N2581 Secondary hyperparathyroidism of renal origin: Secondary | ICD-10-CM | POA: Diagnosis not present

## 2023-05-20 DIAGNOSIS — N186 End stage renal disease: Secondary | ICD-10-CM | POA: Diagnosis not present

## 2023-05-20 DIAGNOSIS — D509 Iron deficiency anemia, unspecified: Secondary | ICD-10-CM | POA: Diagnosis not present

## 2023-05-20 DIAGNOSIS — T8249XD Other complication of vascular dialysis catheter, subsequent encounter: Secondary | ICD-10-CM | POA: Diagnosis not present

## 2023-05-21 DIAGNOSIS — Z89612 Acquired absence of left leg above knee: Secondary | ICD-10-CM | POA: Diagnosis not present

## 2023-05-21 DIAGNOSIS — I96 Gangrene, not elsewhere classified: Secondary | ICD-10-CM | POA: Diagnosis not present

## 2023-05-22 ENCOUNTER — Ambulatory Visit (HOSPITAL_BASED_OUTPATIENT_CLINIC_OR_DEPARTMENT_OTHER): Payer: 59 | Admitting: Internal Medicine

## 2023-05-22 ENCOUNTER — Telehealth: Payer: Self-pay

## 2023-05-22 NOTE — Telephone Encounter (Signed)
Copied from CRM [id number]. Topic: General - Other >> May 18, 2023 11:57 AM Geroge Baseman wrote: Reason for CRM: Corey [name] is going to be offered case management services, benefit he got with [employer]. Corey luellen requested a referral for personal care services, or caregiver services. Nurse [phone] ex [phone] Amil Amen with [employer]. Please call to discuss as she is the nurse that is assigned to him through his insurance.

## 2023-05-22 NOTE — Telephone Encounter (Signed)
PCS request for services was sent for the patient on 01/23/2023.

## 2023-05-23 DIAGNOSIS — T8249XD Other complication of vascular dialysis catheter, subsequent encounter: Secondary | ICD-10-CM | POA: Diagnosis not present

## 2023-05-23 DIAGNOSIS — N2581 Secondary hyperparathyroidism of renal origin: Secondary | ICD-10-CM | POA: Diagnosis not present

## 2023-05-23 DIAGNOSIS — N186 End stage renal disease: Secondary | ICD-10-CM | POA: Diagnosis not present

## 2023-05-23 DIAGNOSIS — M19041 Primary osteoarthritis, right hand: Secondary | ICD-10-CM | POA: Diagnosis not present

## 2023-05-23 DIAGNOSIS — Z992 Dependence on renal dialysis: Secondary | ICD-10-CM | POA: Diagnosis not present

## 2023-05-23 DIAGNOSIS — D631 Anemia in chronic kidney disease: Secondary | ICD-10-CM | POA: Diagnosis not present

## 2023-05-23 DIAGNOSIS — D509 Iron deficiency anemia, unspecified: Secondary | ICD-10-CM | POA: Diagnosis not present

## 2023-05-23 DIAGNOSIS — G8929 Other chronic pain: Secondary | ICD-10-CM | POA: Diagnosis not present

## 2023-05-23 DIAGNOSIS — D689 Coagulation defect, unspecified: Secondary | ICD-10-CM | POA: Diagnosis not present

## 2023-05-24 DIAGNOSIS — H5410 Blindness, one eye, low vision other eye, unspecified eyes: Secondary | ICD-10-CM | POA: Diagnosis not present

## 2023-05-24 DIAGNOSIS — Z993 Dependence on wheelchair: Secondary | ICD-10-CM | POA: Diagnosis not present

## 2023-05-24 DIAGNOSIS — I120 Hypertensive chronic kidney disease with stage 5 chronic kidney disease or end stage renal disease: Secondary | ICD-10-CM | POA: Diagnosis not present

## 2023-05-24 DIAGNOSIS — T8744 Infection of amputation stump, left lower extremity: Secondary | ICD-10-CM | POA: Diagnosis not present

## 2023-05-24 DIAGNOSIS — I480 Paroxysmal atrial fibrillation: Secondary | ICD-10-CM | POA: Diagnosis not present

## 2023-05-24 DIAGNOSIS — N186 End stage renal disease: Secondary | ICD-10-CM | POA: Diagnosis not present

## 2023-05-24 DIAGNOSIS — M19041 Primary osteoarthritis, right hand: Secondary | ICD-10-CM | POA: Diagnosis not present

## 2023-05-24 DIAGNOSIS — G629 Polyneuropathy, unspecified: Secondary | ICD-10-CM | POA: Diagnosis not present

## 2023-05-24 DIAGNOSIS — T8781 Dehiscence of amputation stump: Secondary | ICD-10-CM | POA: Diagnosis not present

## 2023-05-24 DIAGNOSIS — G8929 Other chronic pain: Secondary | ICD-10-CM | POA: Diagnosis not present

## 2023-05-24 DIAGNOSIS — Z992 Dependence on renal dialysis: Secondary | ICD-10-CM | POA: Diagnosis not present

## 2023-05-24 DIAGNOSIS — Z7901 Long term (current) use of anticoagulants: Secondary | ICD-10-CM | POA: Diagnosis not present

## 2023-05-25 NOTE — Telephone Encounter (Signed)
Sent form back with michelle signature

## 2023-05-27 DIAGNOSIS — D631 Anemia in chronic kidney disease: Secondary | ICD-10-CM | POA: Diagnosis not present

## 2023-05-27 DIAGNOSIS — D509 Iron deficiency anemia, unspecified: Secondary | ICD-10-CM | POA: Diagnosis not present

## 2023-05-27 DIAGNOSIS — Z992 Dependence on renal dialysis: Secondary | ICD-10-CM | POA: Diagnosis not present

## 2023-05-27 DIAGNOSIS — N2581 Secondary hyperparathyroidism of renal origin: Secondary | ICD-10-CM | POA: Diagnosis not present

## 2023-05-27 DIAGNOSIS — T8249XD Other complication of vascular dialysis catheter, subsequent encounter: Secondary | ICD-10-CM | POA: Diagnosis not present

## 2023-05-27 DIAGNOSIS — D689 Coagulation defect, unspecified: Secondary | ICD-10-CM | POA: Diagnosis not present

## 2023-05-27 DIAGNOSIS — N186 End stage renal disease: Secondary | ICD-10-CM | POA: Diagnosis not present

## 2023-05-30 DIAGNOSIS — N2581 Secondary hyperparathyroidism of renal origin: Secondary | ICD-10-CM | POA: Diagnosis not present

## 2023-05-30 DIAGNOSIS — N186 End stage renal disease: Secondary | ICD-10-CM | POA: Diagnosis not present

## 2023-05-30 DIAGNOSIS — T8249XD Other complication of vascular dialysis catheter, subsequent encounter: Secondary | ICD-10-CM | POA: Diagnosis not present

## 2023-05-30 DIAGNOSIS — D689 Coagulation defect, unspecified: Secondary | ICD-10-CM | POA: Diagnosis not present

## 2023-05-30 DIAGNOSIS — D509 Iron deficiency anemia, unspecified: Secondary | ICD-10-CM | POA: Diagnosis not present

## 2023-05-30 DIAGNOSIS — Z992 Dependence on renal dialysis: Secondary | ICD-10-CM | POA: Diagnosis not present

## 2023-05-30 DIAGNOSIS — D631 Anemia in chronic kidney disease: Secondary | ICD-10-CM | POA: Diagnosis not present

## 2023-05-31 DIAGNOSIS — Z993 Dependence on wheelchair: Secondary | ICD-10-CM | POA: Diagnosis not present

## 2023-05-31 DIAGNOSIS — T8744 Infection of amputation stump, left lower extremity: Secondary | ICD-10-CM | POA: Diagnosis not present

## 2023-05-31 DIAGNOSIS — I480 Paroxysmal atrial fibrillation: Secondary | ICD-10-CM | POA: Diagnosis not present

## 2023-05-31 DIAGNOSIS — N186 End stage renal disease: Secondary | ICD-10-CM | POA: Diagnosis not present

## 2023-05-31 DIAGNOSIS — S81002A Unspecified open wound, left knee, initial encounter: Secondary | ICD-10-CM | POA: Diagnosis not present

## 2023-05-31 DIAGNOSIS — Z992 Dependence on renal dialysis: Secondary | ICD-10-CM | POA: Diagnosis not present

## 2023-05-31 DIAGNOSIS — T8781 Dehiscence of amputation stump: Secondary | ICD-10-CM | POA: Diagnosis not present

## 2023-05-31 DIAGNOSIS — I120 Hypertensive chronic kidney disease with stage 5 chronic kidney disease or end stage renal disease: Secondary | ICD-10-CM | POA: Diagnosis not present

## 2023-05-31 DIAGNOSIS — Z7901 Long term (current) use of anticoagulants: Secondary | ICD-10-CM | POA: Diagnosis not present

## 2023-05-31 DIAGNOSIS — G629 Polyneuropathy, unspecified: Secondary | ICD-10-CM | POA: Diagnosis not present

## 2023-05-31 DIAGNOSIS — L89893 Pressure ulcer of other site, stage 3: Secondary | ICD-10-CM | POA: Diagnosis not present

## 2023-05-31 DIAGNOSIS — H5410 Blindness, one eye, low vision other eye, unspecified eyes: Secondary | ICD-10-CM | POA: Diagnosis not present

## 2023-06-02 DIAGNOSIS — T8781 Dehiscence of amputation stump: Secondary | ICD-10-CM | POA: Diagnosis not present

## 2023-06-02 DIAGNOSIS — T8744 Infection of amputation stump, left lower extremity: Secondary | ICD-10-CM | POA: Diagnosis not present

## 2023-06-02 DIAGNOSIS — Z7901 Long term (current) use of anticoagulants: Secondary | ICD-10-CM | POA: Diagnosis not present

## 2023-06-02 DIAGNOSIS — I120 Hypertensive chronic kidney disease with stage 5 chronic kidney disease or end stage renal disease: Secondary | ICD-10-CM | POA: Diagnosis not present

## 2023-06-02 DIAGNOSIS — H5410 Blindness, one eye, low vision other eye, unspecified eyes: Secondary | ICD-10-CM | POA: Diagnosis not present

## 2023-06-02 DIAGNOSIS — I129 Hypertensive chronic kidney disease with stage 1 through stage 4 chronic kidney disease, or unspecified chronic kidney disease: Secondary | ICD-10-CM | POA: Diagnosis not present

## 2023-06-02 DIAGNOSIS — I480 Paroxysmal atrial fibrillation: Secondary | ICD-10-CM | POA: Diagnosis not present

## 2023-06-02 DIAGNOSIS — Z992 Dependence on renal dialysis: Secondary | ICD-10-CM | POA: Diagnosis not present

## 2023-06-02 DIAGNOSIS — N186 End stage renal disease: Secondary | ICD-10-CM | POA: Diagnosis not present

## 2023-06-02 DIAGNOSIS — Z993 Dependence on wheelchair: Secondary | ICD-10-CM | POA: Diagnosis not present

## 2023-06-02 DIAGNOSIS — G629 Polyneuropathy, unspecified: Secondary | ICD-10-CM | POA: Diagnosis not present

## 2023-06-03 DIAGNOSIS — Z992 Dependence on renal dialysis: Secondary | ICD-10-CM | POA: Diagnosis not present

## 2023-06-03 DIAGNOSIS — N186 End stage renal disease: Secondary | ICD-10-CM | POA: Diagnosis not present

## 2023-06-03 DIAGNOSIS — N2581 Secondary hyperparathyroidism of renal origin: Secondary | ICD-10-CM | POA: Diagnosis not present

## 2023-06-03 DIAGNOSIS — T8249XD Other complication of vascular dialysis catheter, subsequent encounter: Secondary | ICD-10-CM | POA: Diagnosis not present

## 2023-06-03 DIAGNOSIS — D631 Anemia in chronic kidney disease: Secondary | ICD-10-CM | POA: Diagnosis not present

## 2023-06-06 ENCOUNTER — Other Ambulatory Visit (INDEPENDENT_AMBULATORY_CARE_PROVIDER_SITE_OTHER): Payer: Self-pay | Admitting: Primary Care

## 2023-06-06 DIAGNOSIS — G629 Polyneuropathy, unspecified: Secondary | ICD-10-CM

## 2023-06-07 ENCOUNTER — Telehealth (HOSPITAL_BASED_OUTPATIENT_CLINIC_OR_DEPARTMENT_OTHER): Admitting: Family Medicine

## 2023-06-07 ENCOUNTER — Ambulatory Visit (INDEPENDENT_AMBULATORY_CARE_PROVIDER_SITE_OTHER): Payer: Self-pay | Admitting: Primary Care

## 2023-06-07 DIAGNOSIS — G8929 Other chronic pain: Secondary | ICD-10-CM | POA: Diagnosis not present

## 2023-06-07 DIAGNOSIS — G629 Polyneuropathy, unspecified: Secondary | ICD-10-CM | POA: Diagnosis not present

## 2023-06-07 DIAGNOSIS — N186 End stage renal disease: Secondary | ICD-10-CM | POA: Diagnosis not present

## 2023-06-07 DIAGNOSIS — T8781 Dehiscence of amputation stump: Secondary | ICD-10-CM | POA: Diagnosis not present

## 2023-06-07 DIAGNOSIS — G546 Phantom limb syndrome with pain: Secondary | ICD-10-CM | POA: Diagnosis not present

## 2023-06-07 DIAGNOSIS — I120 Hypertensive chronic kidney disease with stage 5 chronic kidney disease or end stage renal disease: Secondary | ICD-10-CM | POA: Diagnosis not present

## 2023-06-07 DIAGNOSIS — Z7901 Long term (current) use of anticoagulants: Secondary | ICD-10-CM | POA: Diagnosis not present

## 2023-06-07 DIAGNOSIS — Z89612 Acquired absence of left leg above knee: Secondary | ICD-10-CM | POA: Diagnosis not present

## 2023-06-07 DIAGNOSIS — H5410 Blindness, one eye, low vision other eye, unspecified eyes: Secondary | ICD-10-CM | POA: Diagnosis not present

## 2023-06-07 DIAGNOSIS — I480 Paroxysmal atrial fibrillation: Secondary | ICD-10-CM | POA: Diagnosis not present

## 2023-06-07 DIAGNOSIS — G548 Other nerve root and plexus disorders: Secondary | ICD-10-CM

## 2023-06-07 DIAGNOSIS — T8744 Infection of amputation stump, left lower extremity: Secondary | ICD-10-CM | POA: Diagnosis not present

## 2023-06-07 DIAGNOSIS — Z992 Dependence on renal dialysis: Secondary | ICD-10-CM | POA: Diagnosis not present

## 2023-06-07 DIAGNOSIS — Z993 Dependence on wheelchair: Secondary | ICD-10-CM | POA: Diagnosis not present

## 2023-06-07 MED ORDER — DICLOFENAC SODIUM 1 % EX GEL: 2.0000 g | Freq: Four times a day (QID) | CUTANEOUS | 1 refills | Status: DC | PRN

## 2023-06-07 MED ORDER — GABAPENTIN 300 MG PO CAPS: 300.0000 mg | ORAL_CAPSULE | Freq: Every day | ORAL | 1 refills | Status: DC

## 2023-06-07 MED ORDER — DULOXETINE HCL 20 MG PO CPEP: 20.0000 mg | ORAL_CAPSULE | Freq: Every day | ORAL | 0 refills | Status: DC

## 2023-06-07 NOTE — Progress Notes (Signed)
 Virtual Visit via Video Note  I connected with Corey [name], on 06/07/2023 at 5:12 PM by video enabled telemedicine device and verified that I am speaking with the correct person using two identifiers.   Consent: I discussed the limitations, risks, security and privacy concerns of performing an evaluation and management service by telemedicine and the availability of in person appointments. I also discussed with the patient that there may be a patient responsible charge related to this service. The patient expressed understanding and agreed to proceed.   Location of Patient: Home  Location of Provider: Clinic   Persons participating in Telemedicine visit: Corey [name] Dr. Alvis Lemmings    Discussed the use of AI scribe software for clinical note transcription with the patient, who gave verbal consent to proceed.  History of Present Illness Corey [name], a visually impaired male with a history of peripheral arterial disease, left above knee amputation, seizure renal disease, atrial fibrillation, and chronic paraplegia, presents for an acute visit due to needing refills on his medications. He reports being in a lot of pain and has been previously prescribed duloxetine, gabapentin, and Voltaren gel for pain management. He also mentions midodrine, which is used to help his blood pressure stay up. He has been in contact with his cardiologist's clinic regarding his cardiac medications. He also expresses a need for a home health aide to assist him.      Past Medical History:  Diagnosis Date   Arthritis    hands and shoulders   Blindness and low vision    "Stargardt disease"   Dissection of aorta (HCC) 2004   a. s/p extensive repeair in 2004 in [place] complicated by ESRD, lower extremity paralysis, coma, and extended hospitalization of 2 years   ESRD (end stage renal disease) (HCC)    a. TTS   Headache    History of cardioversion 2014   Hypertension    Neuropathy    Non-healing non-surgical  wound 03/2016   PAF (paroxysmal atrial fibrillation) (HCC)    a. s/p DCCV in 2014; b. on Coumadin; c. CHADS2VASc => 2 (HTN, vascular disease)   Paralysis (HCC)    due to dissection of aorta in 2004, lower extremities   Pneumonia    Allergies  Allergen Reactions   Ciprofloxacin Other (See Comments)    Aortic dissection   Heparin Other (See Comments)    UNSPECIFIED REACTION :  On Coumadin since 2004   HIT panel negative 01/19/17   Doxercalciferol Other (See Comments)   Tomato    Quinolones Other (See Comments)    unknown    Current Outpatient Medications on File Prior to Visit  Medication Sig Dispense Refill   acetaminophen (TYLENOL) 325 MG tablet Take 2 tablets (650 mg total) by mouth 4 (four) times daily -  with meals and at bedtime.     amiodarone (PACERONE) 200 MG tablet Take 1 tablet (200 mg total) by mouth daily. 90 tablet 2   apixaban (ELIQUIS) 2.5 MG TABS tablet Take 1 tablet (2.5 mg total) by mouth 2 (two) times daily. 60 tablet 5   bacitracin ointment Apply topically 2 (two) times daily. 120 g 0   camphor-menthol (SARNA) lotion Apply 1 Application topically every 8 (eight) hours as needed for itching.     cinacalcet (SENSIPAR) 30 MG tablet Take 1 tablet (30 mg total) by mouth daily with supper. 30 tablet 0   docusate sodium (COLACE) 100 MG capsule Take 1 capsule (100 mg total) by mouth daily. 100  capsule 0   hydrocerin (EUCERIN) CREA Apply 1 Application topically daily.     lidocaine (LIDODERM) 5 % Place 3 patches onto the skin daily. Remove & Discard patch within 12 hours or as directed by MD 90 patch 0   melatonin 3 MG TABS tablet Take 1 tablet (3 mg total) by mouth at bedtime. 60 tablet 0   methocarbamol (ROBAXIN) 500 MG tablet Take 1 tablet (500 mg total) by mouth every 6 (six) hours as needed for muscle spasms. 40 tablet 0   midodrine (PROAMATINE) 5 MG tablet Take 3 tablets (15 mg total) by mouth 3 (three) times daily with meals. 810 tablet 2   Multiple  Vitamins-Minerals (MULTIVITAMIN WITH MINERALS) tablet Take 1 tablet by mouth daily.     oxyCODONE-acetaminophen (PERCOCET) 5-325 MG tablet Take 1 tablet by mouth every 6 (six) hours as needed for severe pain (pain score 7-10). 15 tablet 0   oxymetazoline (AFRIN) 0.05 % nasal spray Place 1 spray into both nostrils 2 (two) times daily. 30 mL 0   polyethylene glycol powder (GLYCOLAX/MIRALAX) 17 GM/SCOOP powder Take 1 capful (17 g) with water by mouth 2 (two) times daily. 238 g 0   senna-docusate (SENOKOT-S) 8.6-50 MG tablet Take 1 tablet by mouth 2 (two) times daily. 60 tablet 0   sucroferric oxyhydroxide (VELPHORO) 500 MG chewable tablet Chew 1 tablet (500 mg total) by mouth 3 (three) times daily with meals. 90 tablet 0   zolpidem (AMBIEN) 5 MG tablet Take 1 tablet (5 mg total) by mouth at bedtime as needed for sleep (Insomnia). 30 tablet 0   No current facility-administered medications on file prior to visit.    ROS: See HPI  Observations/Objective: Awake, alert, oriented x3 Not in acute distress Normal mood      Latest Ref Rng & Units 12/10/2022    6:54 AM 12/08/2022   12:08 PM 12/06/2022    2:30 PM  CMP  Glucose 70 - 99 mg/dL 89  87  93   BUN 6 - 20 mg/dL 42  53  70   Creatinine 0.61 - 1.24 mg/dL 1.61  0.96  0.45   Sodium 135 - 145 mmol/L 136  135  141   Potassium 3.5 - 5.1 mmol/L 4.8  4.5  5.3   Chloride 98 - 111 mmol/L 98  97  98   CO2 22 - 32 mmol/L 25  23  24    Calcium 8.9 - 10.3 mg/dL 9.2  9.4  9.6     Lipid Panel     Component Value Date/Time   CHOL 150 05/20/2018 0243   TRIG 148 05/20/2018 0243   HDL 27 (L) 05/20/2018 0243   CHOLHDL 5.6 05/20/2018 0243   VLDL 30 05/20/2018 0243   LDLCALC 93 05/20/2018 0243    No results found for: "HGBA1C"    Assessment & Plan Chronic Pain/phantom pain Chronic pain likely due to paraplegia and amputation. Managed with duloxetine, gabapentin, and Voltaren gel. Tramadol inquiry noted. - Refer to pain management clinic for  evaluation. - Advise discussion of tramadol with primary care provider.  Medication Refill Needs refills for current pain management medications. Tramadol requires further discussion with PCP as he is requesting this and I do not see it on his med list. - Refill duloxetine, gabapentin, and Voltaren gel. - Schedule appointment with primary care provider for pain management and tramadol discussion.  Home Health Aide Assistance Requires home assistance due to medical conditions and visual impairment. - Request primary care provider  to complete forms for home health aide, I have sent PCP a message.      Meds ordered this encounter  Medications   DULoxetine (CYMBALTA) 20 MG capsule    Sig: Take 1 capsule (20 mg total) by mouth daily.    Dispense:  90 capsule    Refill:  0   gabapentin (NEURONTIN) 300 MG capsule    Sig: Take 1 capsule (300 mg total) by mouth at bedtime.    Dispense:  90 capsule    Refill:  1   diclofenac Sodium (VOLTAREN) 1 % GEL    Sig: Apply 2 g topically 4 (four) times daily as needed (Arthritis).    Dispense:  400 g    Refill:  1    Follow Up Instructions: With PCP   I discussed the assessment and treatment plan with the patient. The patient was provided an opportunity to ask questions and all were answered. The patient agreed with the plan and demonstrated an understanding of the instructions.   The patient was advised to call back or seek an in-person evaluation if the symptoms worsen or if the condition fails to improve as anticipated.     I provided 13 minutes total of Telehealth time during this encounter including median intraservice time, reviewing previous notes, investigations, ordering medications, medical decision making, coordinating care and patient verbalized understanding at the end of the visit.     Hoy Register, MD, FAAFP. [facility] [place], [place] [phone]   06/07/2023, 5:12 PM

## 2023-06-07 NOTE — Telephone Encounter (Signed)
  Chief Complaint: generalized pain Symptoms: chronic issue Frequency: chronic Pertinent Negatives: Patient denies fever, urinary symptoms, CP, SOB Disposition: [] ED /[] Urgent Care (no appt availability in office) / [x] Appointment(In office/virtual)/ []  [facility]/ [] Home Care/ [] Refused Recommended Disposition /[] [facility] [facility]/ []  Follow-up with PCP Additional Notes: LPN Tammy from [facility] called on behalf of pt. Tammy states pt is having 9/10 generalized pain and that Tramadol has been effective in the past. Pt is out of Tramadol. Pt has been taking Tylenol with no effect. Tammy also states that pt has a wound to his L BKA but that the wound has improved tremendously and his nearly fully healed. Pt denies flu-like symptoms, fever, CP, SOB. Denies urinary symptoms, but states he makes minimal urine. Per protocol for severe pain RN advised pt he should be seen within the next 4 hours. Pt has to arrange transport in advance which takes a day. Pt also has dialysis tomorrow. RN scheduled pt a virtual appt for today at 1510. Tammy and pt verbalized understanding. RN advised Tammy that she or the pt should call us back if pt worsens before that appt, to which she verbalized understanding.   Copied from CRM [id number]. Topic: Clinical - Red Word Triage >> Jun 07, 2023 10:22 AM Gildardo Pounds wrote: Red Word that prompted transfer to Nurse Triage: Tammy, LPN with [facility], 9 out of 10 generalized pain. Tramadol has worked in the past. wound to left BKA. callback number is [phone] Reason for Disposition  [1] SEVERE pain (e.g., excruciating, unable to do any normal activities) AND [2] not improved 2 hours after pain medicine  Answer Assessment - Initial Assessment Questions 1. ONSET: "When did the muscle aches or body pains start?"      Ongoing for quite a while 2. LOCATION: "What part of your body is hurting?" (e.g., entire body, arms, legs)       "Everywhere" 3. SEVERITY: "How bad is the pain?" (Scale 1-10; or mild, moderate, severe)   - MILD (1-3): doesn't interfere with normal activities    - MODERATE (4-7): interferes with normal activities or awakens from sleep    - SEVERE (8-10):  excruciating pain, unable to do any normal activities      9/10 4. CAUSE: "What do you think is causing the pains?"     No falls, no injuries, not sure. Pt has stopped working out. 5. FEVER: "Have you been having fever?"     No 6. OTHER SYMPTOMS: "Do you have any other symptoms?" (e.g., chest pain, weakness, rash, cold or flu symptoms, weight loss)     L BKA wound is almost healed per LPN Tammy (no infection, almost healed up). No flu or cold-like symptoms. Pt has tried Tylenol, not effective. No CP or SOB. No rashes.  Protocols used: Muscle Aches and Body Pain-A-AH

## 2023-06-08 ENCOUNTER — Other Ambulatory Visit (INDEPENDENT_AMBULATORY_CARE_PROVIDER_SITE_OTHER): Payer: Self-pay | Admitting: Primary Care

## 2023-06-08 ENCOUNTER — Telehealth: Payer: Self-pay | Admitting: Family Medicine

## 2023-06-08 DIAGNOSIS — N186 End stage renal disease: Secondary | ICD-10-CM | POA: Diagnosis not present

## 2023-06-08 DIAGNOSIS — D631 Anemia in chronic kidney disease: Secondary | ICD-10-CM | POA: Diagnosis not present

## 2023-06-08 DIAGNOSIS — Z992 Dependence on renal dialysis: Secondary | ICD-10-CM | POA: Diagnosis not present

## 2023-06-08 DIAGNOSIS — T8249XD Other complication of vascular dialysis catheter, subsequent encounter: Secondary | ICD-10-CM | POA: Diagnosis not present

## 2023-06-08 DIAGNOSIS — N2581 Secondary hyperparathyroidism of renal origin: Secondary | ICD-10-CM | POA: Diagnosis not present

## 2023-06-08 DIAGNOSIS — Z89612 Acquired absence of left leg above knee: Secondary | ICD-10-CM

## 2023-06-08 NOTE — Telephone Encounter (Signed)
 He is requesting a home health aide.  Thanks.

## 2023-06-09 DIAGNOSIS — I120 Hypertensive chronic kidney disease with stage 5 chronic kidney disease or end stage renal disease: Secondary | ICD-10-CM | POA: Diagnosis not present

## 2023-06-09 DIAGNOSIS — Z993 Dependence on wheelchair: Secondary | ICD-10-CM | POA: Diagnosis not present

## 2023-06-09 DIAGNOSIS — I480 Paroxysmal atrial fibrillation: Secondary | ICD-10-CM | POA: Diagnosis not present

## 2023-06-09 DIAGNOSIS — G629 Polyneuropathy, unspecified: Secondary | ICD-10-CM | POA: Diagnosis not present

## 2023-06-09 DIAGNOSIS — Z992 Dependence on renal dialysis: Secondary | ICD-10-CM | POA: Diagnosis not present

## 2023-06-09 DIAGNOSIS — H5410 Blindness, one eye, low vision other eye, unspecified eyes: Secondary | ICD-10-CM | POA: Diagnosis not present

## 2023-06-09 DIAGNOSIS — T8744 Infection of amputation stump, left lower extremity: Secondary | ICD-10-CM | POA: Diagnosis not present

## 2023-06-09 DIAGNOSIS — N186 End stage renal disease: Secondary | ICD-10-CM | POA: Diagnosis not present

## 2023-06-09 DIAGNOSIS — Z7901 Long term (current) use of anticoagulants: Secondary | ICD-10-CM | POA: Diagnosis not present

## 2023-06-09 DIAGNOSIS — T8781 Dehiscence of amputation stump: Secondary | ICD-10-CM | POA: Diagnosis not present

## 2023-06-10 DIAGNOSIS — N186 End stage renal disease: Secondary | ICD-10-CM | POA: Diagnosis not present

## 2023-06-10 DIAGNOSIS — D631 Anemia in chronic kidney disease: Secondary | ICD-10-CM | POA: Diagnosis not present

## 2023-06-10 DIAGNOSIS — N2581 Secondary hyperparathyroidism of renal origin: Secondary | ICD-10-CM | POA: Diagnosis not present

## 2023-06-10 DIAGNOSIS — T8249XD Other complication of vascular dialysis catheter, subsequent encounter: Secondary | ICD-10-CM | POA: Diagnosis not present

## 2023-06-10 DIAGNOSIS — Z992 Dependence on renal dialysis: Secondary | ICD-10-CM | POA: Diagnosis not present

## 2023-06-12 DIAGNOSIS — I96 Gangrene, not elsewhere classified: Secondary | ICD-10-CM | POA: Diagnosis not present

## 2023-06-12 DIAGNOSIS — I5032 Chronic diastolic (congestive) heart failure: Secondary | ICD-10-CM | POA: Diagnosis not present

## 2023-06-12 DIAGNOSIS — Z89612 Acquired absence of left leg above knee: Secondary | ICD-10-CM | POA: Diagnosis not present

## 2023-06-12 NOTE — Telephone Encounter (Signed)
 I sent form back to [facility] 2/20 and she stated that pt was needing an appt because it has been longer than 90 days. That is fine doing pcs since he seen Dr. Alvis Lemmings  Thank You

## 2023-06-13 NOTE — Telephone Encounter (Signed)
 Patient seen by Dr. Alvis Lemmings on 06/07/2023. PCS form signed and dated and faxed to [employer]. Confirmation of receipt  received.

## 2023-06-13 NOTE — Telephone Encounter (Signed)
 noted

## 2023-06-14 DIAGNOSIS — G8929 Other chronic pain: Secondary | ICD-10-CM | POA: Diagnosis not present

## 2023-06-14 DIAGNOSIS — Z79891 Long term (current) use of opiate analgesic: Secondary | ICD-10-CM | POA: Diagnosis not present

## 2023-06-15 DIAGNOSIS — T8249XD Other complication of vascular dialysis catheter, subsequent encounter: Secondary | ICD-10-CM | POA: Diagnosis not present

## 2023-06-15 DIAGNOSIS — N186 End stage renal disease: Secondary | ICD-10-CM | POA: Diagnosis not present

## 2023-06-15 DIAGNOSIS — D631 Anemia in chronic kidney disease: Secondary | ICD-10-CM | POA: Diagnosis not present

## 2023-06-15 DIAGNOSIS — N2581 Secondary hyperparathyroidism of renal origin: Secondary | ICD-10-CM | POA: Diagnosis not present

## 2023-06-15 DIAGNOSIS — Z992 Dependence on renal dialysis: Secondary | ICD-10-CM | POA: Diagnosis not present

## 2023-06-16 DIAGNOSIS — Z993 Dependence on wheelchair: Secondary | ICD-10-CM | POA: Diagnosis not present

## 2023-06-16 DIAGNOSIS — Z992 Dependence on renal dialysis: Secondary | ICD-10-CM | POA: Diagnosis not present

## 2023-06-16 DIAGNOSIS — H5410 Blindness, one eye, low vision other eye, unspecified eyes: Secondary | ICD-10-CM | POA: Diagnosis not present

## 2023-06-16 DIAGNOSIS — G629 Polyneuropathy, unspecified: Secondary | ICD-10-CM | POA: Diagnosis not present

## 2023-06-16 DIAGNOSIS — I480 Paroxysmal atrial fibrillation: Secondary | ICD-10-CM | POA: Diagnosis not present

## 2023-06-16 DIAGNOSIS — N186 End stage renal disease: Secondary | ICD-10-CM | POA: Diagnosis not present

## 2023-06-16 DIAGNOSIS — T8744 Infection of amputation stump, left lower extremity: Secondary | ICD-10-CM | POA: Diagnosis not present

## 2023-06-16 DIAGNOSIS — Z7901 Long term (current) use of anticoagulants: Secondary | ICD-10-CM | POA: Diagnosis not present

## 2023-06-16 DIAGNOSIS — T8781 Dehiscence of amputation stump: Secondary | ICD-10-CM | POA: Diagnosis not present

## 2023-06-16 DIAGNOSIS — I120 Hypertensive chronic kidney disease with stage 5 chronic kidney disease or end stage renal disease: Secondary | ICD-10-CM | POA: Diagnosis not present

## 2023-06-17 DIAGNOSIS — N2581 Secondary hyperparathyroidism of renal origin: Secondary | ICD-10-CM | POA: Diagnosis not present

## 2023-06-17 DIAGNOSIS — Z992 Dependence on renal dialysis: Secondary | ICD-10-CM | POA: Diagnosis not present

## 2023-06-17 DIAGNOSIS — N186 End stage renal disease: Secondary | ICD-10-CM | POA: Diagnosis not present

## 2023-06-17 DIAGNOSIS — D631 Anemia in chronic kidney disease: Secondary | ICD-10-CM | POA: Diagnosis not present

## 2023-06-17 DIAGNOSIS — T8249XD Other complication of vascular dialysis catheter, subsequent encounter: Secondary | ICD-10-CM | POA: Diagnosis not present

## 2023-06-18 DIAGNOSIS — Z89612 Acquired absence of left leg above knee: Secondary | ICD-10-CM | POA: Diagnosis not present

## 2023-06-18 DIAGNOSIS — I96 Gangrene, not elsewhere classified: Secondary | ICD-10-CM | POA: Diagnosis not present

## 2023-06-21 DIAGNOSIS — T8781 Dehiscence of amputation stump: Secondary | ICD-10-CM | POA: Diagnosis not present

## 2023-06-21 DIAGNOSIS — G629 Polyneuropathy, unspecified: Secondary | ICD-10-CM | POA: Diagnosis not present

## 2023-06-21 DIAGNOSIS — T8744 Infection of amputation stump, left lower extremity: Secondary | ICD-10-CM | POA: Diagnosis not present

## 2023-06-21 DIAGNOSIS — Z7901 Long term (current) use of anticoagulants: Secondary | ICD-10-CM | POA: Diagnosis not present

## 2023-06-21 DIAGNOSIS — Z993 Dependence on wheelchair: Secondary | ICD-10-CM | POA: Diagnosis not present

## 2023-06-21 DIAGNOSIS — I120 Hypertensive chronic kidney disease with stage 5 chronic kidney disease or end stage renal disease: Secondary | ICD-10-CM | POA: Diagnosis not present

## 2023-06-21 DIAGNOSIS — N186 End stage renal disease: Secondary | ICD-10-CM | POA: Diagnosis not present

## 2023-06-21 DIAGNOSIS — Z992 Dependence on renal dialysis: Secondary | ICD-10-CM | POA: Diagnosis not present

## 2023-06-21 DIAGNOSIS — H5410 Blindness, one eye, low vision other eye, unspecified eyes: Secondary | ICD-10-CM | POA: Diagnosis not present

## 2023-06-21 DIAGNOSIS — I480 Paroxysmal atrial fibrillation: Secondary | ICD-10-CM | POA: Diagnosis not present

## 2023-06-22 DIAGNOSIS — D631 Anemia in chronic kidney disease: Secondary | ICD-10-CM | POA: Diagnosis not present

## 2023-06-22 DIAGNOSIS — G8929 Other chronic pain: Secondary | ICD-10-CM | POA: Diagnosis not present

## 2023-06-22 DIAGNOSIS — T8249XD Other complication of vascular dialysis catheter, subsequent encounter: Secondary | ICD-10-CM | POA: Diagnosis not present

## 2023-06-22 DIAGNOSIS — N186 End stage renal disease: Secondary | ICD-10-CM | POA: Diagnosis not present

## 2023-06-22 DIAGNOSIS — Z992 Dependence on renal dialysis: Secondary | ICD-10-CM | POA: Diagnosis not present

## 2023-06-22 DIAGNOSIS — M19041 Primary osteoarthritis, right hand: Secondary | ICD-10-CM | POA: Diagnosis not present

## 2023-06-22 DIAGNOSIS — N2581 Secondary hyperparathyroidism of renal origin: Secondary | ICD-10-CM | POA: Diagnosis not present

## 2023-06-23 DIAGNOSIS — M19041 Primary osteoarthritis, right hand: Secondary | ICD-10-CM | POA: Diagnosis not present

## 2023-06-23 DIAGNOSIS — Z992 Dependence on renal dialysis: Secondary | ICD-10-CM | POA: Diagnosis not present

## 2023-06-23 DIAGNOSIS — T8781 Dehiscence of amputation stump: Secondary | ICD-10-CM | POA: Diagnosis not present

## 2023-06-23 DIAGNOSIS — S81002A Unspecified open wound, left knee, initial encounter: Secondary | ICD-10-CM | POA: Diagnosis not present

## 2023-06-23 DIAGNOSIS — G8929 Other chronic pain: Secondary | ICD-10-CM | POA: Diagnosis not present

## 2023-06-23 DIAGNOSIS — Z7901 Long term (current) use of anticoagulants: Secondary | ICD-10-CM | POA: Diagnosis not present

## 2023-06-23 DIAGNOSIS — N186 End stage renal disease: Secondary | ICD-10-CM | POA: Diagnosis not present

## 2023-06-23 DIAGNOSIS — Z993 Dependence on wheelchair: Secondary | ICD-10-CM | POA: Diagnosis not present

## 2023-06-23 DIAGNOSIS — T8744 Infection of amputation stump, left lower extremity: Secondary | ICD-10-CM | POA: Diagnosis not present

## 2023-06-23 DIAGNOSIS — I480 Paroxysmal atrial fibrillation: Secondary | ICD-10-CM | POA: Diagnosis not present

## 2023-06-23 DIAGNOSIS — G629 Polyneuropathy, unspecified: Secondary | ICD-10-CM | POA: Diagnosis not present

## 2023-06-23 DIAGNOSIS — H5410 Blindness, one eye, low vision other eye, unspecified eyes: Secondary | ICD-10-CM | POA: Diagnosis not present

## 2023-06-23 DIAGNOSIS — I120 Hypertensive chronic kidney disease with stage 5 chronic kidney disease or end stage renal disease: Secondary | ICD-10-CM | POA: Diagnosis not present

## 2023-06-24 DIAGNOSIS — T8249XD Other complication of vascular dialysis catheter, subsequent encounter: Secondary | ICD-10-CM | POA: Diagnosis not present

## 2023-06-24 DIAGNOSIS — Z992 Dependence on renal dialysis: Secondary | ICD-10-CM | POA: Diagnosis not present

## 2023-06-24 DIAGNOSIS — D631 Anemia in chronic kidney disease: Secondary | ICD-10-CM | POA: Diagnosis not present

## 2023-06-24 DIAGNOSIS — N186 End stage renal disease: Secondary | ICD-10-CM | POA: Diagnosis not present

## 2023-06-24 DIAGNOSIS — N2581 Secondary hyperparathyroidism of renal origin: Secondary | ICD-10-CM | POA: Diagnosis not present

## 2023-06-28 DIAGNOSIS — T8744 Infection of amputation stump, left lower extremity: Secondary | ICD-10-CM | POA: Diagnosis not present

## 2023-06-28 DIAGNOSIS — I480 Paroxysmal atrial fibrillation: Secondary | ICD-10-CM | POA: Diagnosis not present

## 2023-06-28 DIAGNOSIS — T8781 Dehiscence of amputation stump: Secondary | ICD-10-CM | POA: Diagnosis not present

## 2023-06-28 DIAGNOSIS — Z993 Dependence on wheelchair: Secondary | ICD-10-CM | POA: Diagnosis not present

## 2023-06-28 DIAGNOSIS — Z992 Dependence on renal dialysis: Secondary | ICD-10-CM | POA: Diagnosis not present

## 2023-06-28 DIAGNOSIS — N186 End stage renal disease: Secondary | ICD-10-CM | POA: Diagnosis not present

## 2023-06-28 DIAGNOSIS — Z7901 Long term (current) use of anticoagulants: Secondary | ICD-10-CM | POA: Diagnosis not present

## 2023-06-28 DIAGNOSIS — I120 Hypertensive chronic kidney disease with stage 5 chronic kidney disease or end stage renal disease: Secondary | ICD-10-CM | POA: Diagnosis not present

## 2023-06-28 DIAGNOSIS — H5410 Blindness, one eye, low vision other eye, unspecified eyes: Secondary | ICD-10-CM | POA: Diagnosis not present

## 2023-06-28 DIAGNOSIS — G629 Polyneuropathy, unspecified: Secondary | ICD-10-CM | POA: Diagnosis not present

## 2023-06-29 DIAGNOSIS — N186 End stage renal disease: Secondary | ICD-10-CM | POA: Diagnosis not present

## 2023-06-29 DIAGNOSIS — N2581 Secondary hyperparathyroidism of renal origin: Secondary | ICD-10-CM | POA: Diagnosis not present

## 2023-06-29 DIAGNOSIS — Z992 Dependence on renal dialysis: Secondary | ICD-10-CM | POA: Diagnosis not present

## 2023-06-29 DIAGNOSIS — D631 Anemia in chronic kidney disease: Secondary | ICD-10-CM | POA: Diagnosis not present

## 2023-06-29 DIAGNOSIS — T8249XD Other complication of vascular dialysis catheter, subsequent encounter: Secondary | ICD-10-CM | POA: Diagnosis not present

## 2023-06-30 DIAGNOSIS — Z7901 Long term (current) use of anticoagulants: Secondary | ICD-10-CM | POA: Diagnosis not present

## 2023-06-30 DIAGNOSIS — H5410 Blindness, one eye, low vision other eye, unspecified eyes: Secondary | ICD-10-CM | POA: Diagnosis not present

## 2023-06-30 DIAGNOSIS — Z992 Dependence on renal dialysis: Secondary | ICD-10-CM | POA: Diagnosis not present

## 2023-06-30 DIAGNOSIS — Z993 Dependence on wheelchair: Secondary | ICD-10-CM | POA: Diagnosis not present

## 2023-06-30 DIAGNOSIS — I120 Hypertensive chronic kidney disease with stage 5 chronic kidney disease or end stage renal disease: Secondary | ICD-10-CM | POA: Diagnosis not present

## 2023-06-30 DIAGNOSIS — T8781 Dehiscence of amputation stump: Secondary | ICD-10-CM | POA: Diagnosis not present

## 2023-06-30 DIAGNOSIS — G629 Polyneuropathy, unspecified: Secondary | ICD-10-CM | POA: Diagnosis not present

## 2023-06-30 DIAGNOSIS — N186 End stage renal disease: Secondary | ICD-10-CM | POA: Diagnosis not present

## 2023-06-30 DIAGNOSIS — I480 Paroxysmal atrial fibrillation: Secondary | ICD-10-CM | POA: Diagnosis not present

## 2023-06-30 DIAGNOSIS — T8744 Infection of amputation stump, left lower extremity: Secondary | ICD-10-CM | POA: Diagnosis not present

## 2023-07-01 DIAGNOSIS — Z992 Dependence on renal dialysis: Secondary | ICD-10-CM | POA: Diagnosis not present

## 2023-07-01 DIAGNOSIS — N186 End stage renal disease: Secondary | ICD-10-CM | POA: Diagnosis not present

## 2023-07-01 DIAGNOSIS — N2581 Secondary hyperparathyroidism of renal origin: Secondary | ICD-10-CM | POA: Diagnosis not present

## 2023-07-01 DIAGNOSIS — T8249XD Other complication of vascular dialysis catheter, subsequent encounter: Secondary | ICD-10-CM | POA: Diagnosis not present

## 2023-07-01 DIAGNOSIS — D631 Anemia in chronic kidney disease: Secondary | ICD-10-CM | POA: Diagnosis not present

## 2023-07-10 ENCOUNTER — Ambulatory Visit (HOSPITAL_COMMUNITY)
Admission: RE | Admit: 2023-07-10 | Discharge: 2023-07-10 | Disposition: A | Discharge status: Home / Self Care | Payer: 59 | Source: Ambulatory Visit | Attending: Surgery | Admitting: Surgery

## 2023-07-10 ENCOUNTER — Ambulatory Visit (INDEPENDENT_AMBULATORY_CARE_PROVIDER_SITE_OTHER): Payer: 59 | Admitting: Physician Assistant

## 2023-07-10 VITALS — BP 133/81 | Temp 98.8°F | Ht 75.0 in | Wt 140.0 lb

## 2023-07-10 DIAGNOSIS — I739 Peripheral vascular disease, unspecified: Secondary | ICD-10-CM | POA: Diagnosis present

## 2023-07-10 LAB — VAS US ABI WITH/WO TBI

## 2023-07-10 NOTE — Progress Notes (Signed)
 Office Note   History of Present Illness   Corey [name] is a 53 y.o. (1971-03-31) male who presents for follow-up.  He has a history of left AKA on 11/17/2022 by Dr. Myra [name].  This was done for a left heel ulceration with no revascularization options.  The patient is nonambulatory and has been confined to a wheelchair prior to his amputation.  Postoperatively he had some wound dehiscence of his left AKA, which required wet-to-dry dressing changes.  He also has a history of chronic venous insufficiency with hyperkeratosis.  He returns today for follow-up.  He states that his left AKA is now completely healed.  He endorses occasional phantom pains in his left AKA.  He also has chronic back pain that shoots down into the right leg occasionally.  He denies any rest pain.  He is unsure if he has any wounds in his right lower extremity.  He is blind and is unable to evaluate his leg.  He denies any recent fevers or chills. Current Outpatient Medications  Medication Sig Dispense Refill   acetaminophen (TYLENOL) 325 MG tablet Take 2 tablets (650 mg total) by mouth 4 (four) times daily -  with meals and at bedtime.     amiodarone (PACERONE) 200 MG tablet Take 1 tablet (200 mg total) by mouth daily. 90 tablet 2   apixaban (ELIQUIS) 2.5 MG TABS tablet Take 1 tablet (2.5 mg total) by mouth 2 (two) times daily. 60 tablet 5   bacitracin ointment Apply topically 2 (two) times daily. 120 g 0   camphor-menthol (SARNA) lotion Apply 1 Application topically every 8 (eight) hours as needed for itching.     cinacalcet (SENSIPAR) 30 MG tablet Take 1 tablet (30 mg total) by mouth daily with supper. 30 tablet 0   diclofenac Sodium (VOLTAREN) 1 % GEL Apply 2 g topically 4 (four) times daily as needed (Arthritis). 400 g 1   docusate sodium (COLACE) 100 MG capsule Take 1 capsule (100 mg total) by mouth daily. 100 capsule 0   DULoxetine (CYMBALTA) 20 MG capsule Take 1 capsule (20 mg total) by mouth daily. 90 capsule 0    gabapentin (NEURONTIN) 300 MG capsule Take 1 capsule (300 mg total) by mouth at bedtime. 90 capsule 1   hydrocerin (EUCERIN) CREA Apply 1 Application topically daily.     lidocaine (LIDODERM) 5 % Place 3 patches onto the skin daily. Remove & Discard patch within 12 hours or as directed by MD 90 patch 0   melatonin 3 MG TABS tablet Take 1 tablet (3 mg total) by mouth at bedtime. 60 tablet 0   methocarbamol (ROBAXIN) 500 MG tablet Take 1 tablet (500 mg total) by mouth every 6 (six) hours as needed for muscle spasms. 40 tablet 0   midodrine (PROAMATINE) 5 MG tablet Take 3 tablets (15 mg total) by mouth 3 (three) times daily with meals. 810 tablet 2   Multiple Vitamins-Minerals (MULTIVITAMIN WITH MINERALS) tablet Take 1 tablet by mouth daily.     oxymetazoline (AFRIN) 0.05 % nasal spray Place 1 spray into both nostrils 2 (two) times daily. 30 mL 0   polyethylene glycol powder (GLYCOLAX/MIRALAX) 17 GM/SCOOP powder Take 1 capful (17 g) with water by mouth 2 (two) times daily. 238 g 0   senna-docusate (SENOKOT-S) 8.6-50 MG tablet Take 1 tablet by mouth 2 (two) times daily. 60 tablet 0   sucroferric oxyhydroxide (VELPHORO) 500 MG chewable tablet Chew 1 tablet (500 mg total) by mouth 3 (three)  times daily with meals. 90 tablet 0   oxyCODONE-acetaminophen (PERCOCET) 5-325 MG tablet Take 1 tablet by mouth every 6 (six) hours as needed for severe pain (pain score 7-10). (Patient not taking: Reported on 07/10/2023) 15 tablet 0   zolpidem (AMBIEN) 5 MG tablet Take 1 tablet (5 mg total) by mouth at bedtime as needed for sleep (Insomnia). (Patient not taking: Reported on 07/10/2023) 30 tablet 0   No current facility-administered medications for this visit.    REVIEW OF SYSTEMS (negative unless checked):   Cardiac:  []  Chest pain or chest pressure? []  Shortness of breath upon activity? []  Shortness of breath when lying flat? []  Irregular heart rhythm?  Vascular:  []  Pain in calf, thigh, or hip brought on by  walking? []  Pain in feet at night that wakes you up from your sleep? []  Blood clot in your veins? [x]  Leg swelling?  Pulmonary:  [x]  Oxygen at home? []  Productive cough? []  Wheezing?  Neurologic:  []  Sudden weakness in arms or legs? []  Sudden numbness in arms or legs? []  Sudden onset of difficult speaking or slurred speech? []  Temporary loss of vision in one eye? []  Problems with dizziness?  Gastrointestinal:  []  Blood in stool? []  Vomited blood?  Genitourinary:  []  Burning when urinating? []  Blood in urine?  Psychiatric:  []  Major depression  Hematologic:  []  Bleeding problems? []  Problems with blood clotting?  Dermatologic:  []  Rashes or ulcers?  Constitutional:  []  Fever or chills?  Ear/Nose/Throat:  []  Change in hearing? []  Nose bleeds? []  Sore throat?  Musculoskeletal:  [x]  Back pain? []  Joint pain? []  Muscle pain?   Physical Examination   Vitals:   07/10/23 1344  BP: 133/81  Temp: 98.8 F (37.1 C)  TempSrc: Temporal  SpO2: 93%  Weight: 140 lb (63.5 kg)  Height: 6\' 3"  (1.905 m)   Body mass index is 17.5 kg/m.  General:  chronically ill appearing, in wheelchair, NAD Gait: Not observed HENT: WNL, normocephalic Pulmonary: normal non-labored breathing, on supplemental oxygen Cardiac: regular Abdomen: soft, NT, no masses Vascular Exam/Pulses: difficult to insonate due to thickened skin, however there is present DP/PT doppler signals on the right Extremities: swelling of the right foot and right lower leg with associated hyperkeratosis. No open wounds Musculoskeletal: no muscle wasting or atrophy  Neurologic: A&O X 3;  No focal weakness or paresthesias are detected Psychiatric:  The pt has Normal affect.  Non-Invasive Vascular imaging   ABI (07/10/2023) R:  ABI: [place] PT: mono DP: mono  L AKA  Medical Decision Making   Corey [name] is a 53 y.o. male who presents for follow up   Based on the patient's vascular studies, his ABIs on the  right remain noncompressible He has a history of left above-knee amputation in 2024 for a heel ulceration with no revascularization options.  He did have some wound dehiscence postoperatively, however this is now healed. He does have chronic right lower extremity swelling with extensive hyperkeratosis.  He denies any rest pain in the right lower extremity.  On exam his doppler signals are difficult to insonate due to his thickened skin, however he appears to have monophasic DP/PT Doppler signals.  He has no tissue loss on the right He is aware if he should develop a nonhealing wound of his right lower extremity, he would most likely require amputation.  He can follow-up with our office as needed   Loel Dubonnet PA-C [facility] Office: [phone]  Clinic MD: Corey [name]

## 2023-07-19 ENCOUNTER — Ambulatory Visit: Payer: Self-pay

## 2023-07-19 NOTE — Telephone Encounter (Signed)
 No ma'am. I have looked through the faxes I have already faxed and I have looked in our paperwork bin for this week

## 2023-07-19 NOTE — Telephone Encounter (Signed)
 Chief Complaint: oxygen equipment malfunction Symptoms: mild SOB, hypoxia Frequency: x this morning Pertinent Negatives: Patient denies chest pain, fever. Disposition: [] ED /[] Urgent Care (no appt availability in office) / [] Appointment(In office/virtual)/ []  [facility]/ [] Home Care/ [] Refused Recommended Disposition /[] [facility]/ [x]  Follow-up with PCP Additional Notes: Patient calling in requesting a prescription for oxygen meter to go to [facility]. He states he is having an issue with the equipment, called [facility] and they state they can not send anyone to fix it until they receive the new prescription. He is supposed to be on 3L but is on 1.5L, he noticed this morning. SpO2 is 86% and mild SOB. Patient speaking in full sentences no labored breathing noted. Called [employer] to advise and they state send high priority CRM to have the clinic RN review and call patient back for further disposition and advice. Patient aware he will be receiving a call back from the clinic.  Copied from CRM [id number]. Topic: Clinical - Red Word Triage >> Jul 19, 2023 10:21 AM Elle L wrote: Red Word that prompted transfer to Nurse Triage: The patient called requesting a new prescription for an oxygen meter to go to [facility]. However, the patient advised me that he is light headed as his oxygen is stuck on 1 1/2 meters and he is supposed to be on 3 meters. Reason for Disposition  Oxygen level (e.g., pulse oximetry) 90 percent or lower  Answer Assessment - Initial Assessment Questions 1. MAIN CONCERN OR SYMPTOM: "What's your main concern?" (e.g., low oxygen level, breathing difficulty) "What question do you have?"     Patient states his oxygen is supposed to be at 3L but he noticed it is at 1.5 L.  2. ONSET: "When did you notice this ?"      This morning.  3. OXYGEN THERAPY:    - "Do you currently use home oxygen?"    - If Yes, ask: "What is your oxygen source?" (e.g., O2 tank, O2  concentrator).    - If Yes, ask: "How do you get the oxygen?" (e.g., nasal prongs, face mask).    - If Yes, ask: "How much oxygen are you supposed to use?" (e.g., 1-2 L )     Yes. He states he is on a concentrator, nasal prongs and he is supposed to use 3L.  4.  OXYGEN EQUIPMENT:  "Are you having any trouble with your oxygen equipment?"  (e.g., cannula, mask, tubing, tank, concentrator)     Yes, he states he tried to turn up his concentrator as high as it goes. He has reset it a couple of times. He also states he has turned it off and on and it will not go above 1.5L.  5. OXYGEN SATURATION MONITOR:     - "Do you use an oxygen saturation monitor (pulse oximeter) at home?"    - If Yes, ask: "Where do you place the probe?" (e.g., fingertip, ear lobe)     Yes, on fingertip.  6. OXYGEN LEVEL: "What is your reading (oxygen level) today?" "What is your usual oxygen saturation reading?" (e.g., 95%)     86%. Usually is 96%.  7. VSS MONITORING: "Do you monitor/measure your oxygen level or vital signs?" (e.g., yes, no, measurements are automatically sent to provider/call center). Document CURRENT and NORMAL BASELINE values if available.     -  P: "What is your pulse rate per minute?"   -  RR: "What is your respiratory rate per minute?"  N/A.  8. BREATHING DIFFICULTY: "Are you having any difficulty breathing?" If Yes, ask: "How bad is it?"  (e.g., none, mild, moderate, severe)    - MILD: No SOB at rest, mild SOB with walking, speaks normally in sentences, able to lie down, no retractions, pulse < 100.    - MODERATE: SOB at rest, SOB with minimal exertion and prefers to sit, cannot lie down flat, speaks in phrases, mild retractions, audible wheezing, pulse 100-120.    - SEVERE: Very SOB at rest, speaks in single words, struggling to breathe, sitting hunched forward, retractions, pulse > 120      He states he feels a little SOB. Patient reports mild.  9. OTHER SYMPTOMS: "Do you have any other  symptoms?" (e.g., fever, change in sputum)     Mild lightheaded.  10. SMOKING: "Do you smoke currently?" "Is there anyone that smokes around you?"  (Note: smoking around oxygen is dangerous!)       No.  Protocols used: Oxygen Monitoring and Hypoxia-A-AH

## 2023-07-19 NOTE — Telephone Encounter (Signed)
 Would you be able to assist me with this.... the only thing I see about pulmonary is where the pt was in the hospital and a chest tube had to be placed.

## 2023-07-20 ENCOUNTER — Ambulatory Visit: Payer: Self-pay

## 2023-07-20 ENCOUNTER — Telehealth: Payer: Self-pay

## 2023-07-20 NOTE — Telephone Encounter (Signed)
 Noted.

## 2023-07-20 NOTE — Telephone Encounter (Signed)
 Noted will wait on documents

## 2023-07-20 NOTE — Telephone Encounter (Signed)
 Chief Complaint: Oxygen Machine Malfunction Symptoms: Lightheaded Frequency: Yesterday Pertinent Negatives: Patient denies fever, CP, SOB Disposition: [] ED /[] Urgent Care (no appt availability in office) / [x] Appointment(In office/virtual)/ []  [place] [facility]/ [] Home Care/ [] Refused Recommended Disposition /[] [place] [facility]/ []  Follow-up with PCP Additional Notes: Pt reports he is awaiting repair on his oxygen concentrator, notes new onset intermittent lightheadedness. Pt denies SOB, CP. Current SpO2 is 98%, pt reports levels reached 86% yesterday. No OV available, pt needs appt. This RN educated pt on home care, new-worsening symptoms, when to call back/seek emergent care. Pt verbalized understanding and agrees to plan.    Copied from CRM [id number]. Topic: Clinical - Red Word Triage >> Jul 20, 2023  2:58 PM Ethelle Herb L wrote: Red Word that prompted transfer to Nurse Triage: following up on oxygen machine not working properly, still light headed sometimes but not worsening. Reason for Disposition  [1] MODERATE dizziness (e.g., interferes with normal activities) AND [2] has NOT been evaluated by doctor (or NP/PA) for this  (Exception: Dizziness caused by heat exposure, sudden standing, or poor fluid intake.)  Answer Assessment - Initial Assessment Questions 1. DESCRIPTION: "Describe your dizziness."     Lightheaded 2. LIGHTHEADED: "Do you feel lightheaded?" (e.g., somewhat faint, woozy, weak upon standing)     Woozy 4. SEVERITY: "How bad is it?"  "Do you feel like you are going to faint?" "Can you stand and walk?"   - MILD: Feels slightly dizzy, but walking normally.   - MODERATE: Feels unsteady when walking, but not falling; interferes with normal activities (e.g., school, work).   - SEVERE: Unable to walk without falling, or requires assistance to walk without falling; feels like passing out now.      None 5. ONSET:  "When did the dizziness begin?"     Yesterday  8. CAUSE:  "What do you think is causing the dizziness?"     Pt reports concentrator not working effectively 9. RECURRENT SYMPTOM: "Have you had dizziness before?" If Yes, ask: "When was the last time?" "What happened that time?"     No, new symptoms 10. OTHER SYMPTOMS: "Do you have any other symptoms?" (e.g., fever, chest pain, vomiting, diarrhea, bleeding)       None  Protocols used: Dizziness - Lightheadedness-A-AH

## 2023-07-20 NOTE — Telephone Encounter (Signed)
 Spoke  with patient. Patient is using his portable oxygen tank now. Patient has  not been using the portable. Voices that he does not feel safe. Concentrator  is broken. [employer] will not come out the fix until required documentation is received with PCP signature. Patient is aware . Voiced he will try to use portable tanks until the concentrator is fix. Advised of the oxygen precaution. Patient voiced understanding.

## 2023-07-20 NOTE — Telephone Encounter (Signed)
 I spoke to [facility] and explained that I have not received the CMN for the provider to sign.    He was able to review the patient's account and determined that a CMN is not needed for the O2 and and he is not sure why the account was locked. He was able to put in a request for a tech to see the patient tonight to assess the O2 concentrator.   I called the patient and he said that the [employer] technician has already contacted him and will be out to his house tonight.

## 2023-07-21 NOTE — Telephone Encounter (Signed)
 Noted.

## 2023-08-03 DIAGNOSIS — D631 Anemia in chronic kidney disease: Secondary | ICD-10-CM | POA: Diagnosis not present

## 2023-08-03 DIAGNOSIS — T8249XD Other complication of vascular dialysis catheter, subsequent encounter: Secondary | ICD-10-CM | POA: Diagnosis not present

## 2023-08-03 DIAGNOSIS — N186 End stage renal disease: Secondary | ICD-10-CM | POA: Diagnosis not present

## 2023-08-03 DIAGNOSIS — D509 Iron deficiency anemia, unspecified: Secondary | ICD-10-CM | POA: Diagnosis not present

## 2023-08-03 DIAGNOSIS — N2581 Secondary hyperparathyroidism of renal origin: Secondary | ICD-10-CM | POA: Diagnosis not present

## 2023-08-03 DIAGNOSIS — Z992 Dependence on renal dialysis: Secondary | ICD-10-CM | POA: Diagnosis not present

## 2023-08-04 DIAGNOSIS — Z89612 Acquired absence of left leg above knee: Secondary | ICD-10-CM | POA: Diagnosis not present

## 2023-08-04 DIAGNOSIS — I5032 Chronic diastolic (congestive) heart failure: Secondary | ICD-10-CM | POA: Diagnosis not present

## 2023-08-04 DIAGNOSIS — I96 Gangrene, not elsewhere classified: Secondary | ICD-10-CM | POA: Diagnosis not present

## 2023-08-05 DIAGNOSIS — T8249XD Other complication of vascular dialysis catheter, subsequent encounter: Secondary | ICD-10-CM | POA: Diagnosis not present

## 2023-08-05 DIAGNOSIS — D509 Iron deficiency anemia, unspecified: Secondary | ICD-10-CM | POA: Diagnosis not present

## 2023-08-05 DIAGNOSIS — N2581 Secondary hyperparathyroidism of renal origin: Secondary | ICD-10-CM | POA: Diagnosis not present

## 2023-08-05 DIAGNOSIS — D631 Anemia in chronic kidney disease: Secondary | ICD-10-CM | POA: Diagnosis not present

## 2023-08-05 DIAGNOSIS — Z992 Dependence on renal dialysis: Secondary | ICD-10-CM | POA: Diagnosis not present

## 2023-08-05 DIAGNOSIS — N186 End stage renal disease: Secondary | ICD-10-CM | POA: Diagnosis not present

## 2023-08-10 DIAGNOSIS — Z992 Dependence on renal dialysis: Secondary | ICD-10-CM | POA: Diagnosis not present

## 2023-08-10 DIAGNOSIS — T8249XD Other complication of vascular dialysis catheter, subsequent encounter: Secondary | ICD-10-CM | POA: Diagnosis not present

## 2023-08-10 DIAGNOSIS — D509 Iron deficiency anemia, unspecified: Secondary | ICD-10-CM | POA: Diagnosis not present

## 2023-08-10 DIAGNOSIS — N186 End stage renal disease: Secondary | ICD-10-CM | POA: Diagnosis not present

## 2023-08-10 DIAGNOSIS — D631 Anemia in chronic kidney disease: Secondary | ICD-10-CM | POA: Diagnosis not present

## 2023-08-10 DIAGNOSIS — N2581 Secondary hyperparathyroidism of renal origin: Secondary | ICD-10-CM | POA: Diagnosis not present

## 2023-08-12 DIAGNOSIS — T8249XD Other complication of vascular dialysis catheter, subsequent encounter: Secondary | ICD-10-CM | POA: Diagnosis not present

## 2023-08-12 DIAGNOSIS — D631 Anemia in chronic kidney disease: Secondary | ICD-10-CM | POA: Diagnosis not present

## 2023-08-12 DIAGNOSIS — D509 Iron deficiency anemia, unspecified: Secondary | ICD-10-CM | POA: Diagnosis not present

## 2023-08-12 DIAGNOSIS — Z992 Dependence on renal dialysis: Secondary | ICD-10-CM | POA: Diagnosis not present

## 2023-08-12 DIAGNOSIS — N186 End stage renal disease: Secondary | ICD-10-CM | POA: Diagnosis not present

## 2023-08-12 DIAGNOSIS — N2581 Secondary hyperparathyroidism of renal origin: Secondary | ICD-10-CM | POA: Diagnosis not present

## 2023-08-17 ENCOUNTER — Ambulatory Visit (HOSPITAL_BASED_OUTPATIENT_CLINIC_OR_DEPARTMENT_OTHER): Admitting: Internal Medicine

## 2023-08-17 DIAGNOSIS — N2581 Secondary hyperparathyroidism of renal origin: Secondary | ICD-10-CM | POA: Diagnosis not present

## 2023-08-17 DIAGNOSIS — D509 Iron deficiency anemia, unspecified: Secondary | ICD-10-CM | POA: Diagnosis not present

## 2023-08-17 DIAGNOSIS — N186 End stage renal disease: Secondary | ICD-10-CM | POA: Diagnosis not present

## 2023-08-17 DIAGNOSIS — D631 Anemia in chronic kidney disease: Secondary | ICD-10-CM | POA: Diagnosis not present

## 2023-08-17 DIAGNOSIS — T8249XD Other complication of vascular dialysis catheter, subsequent encounter: Secondary | ICD-10-CM | POA: Diagnosis not present

## 2023-08-17 DIAGNOSIS — Z992 Dependence on renal dialysis: Secondary | ICD-10-CM | POA: Diagnosis not present

## 2023-08-22 DIAGNOSIS — N2581 Secondary hyperparathyroidism of renal origin: Secondary | ICD-10-CM | POA: Diagnosis not present

## 2023-08-22 DIAGNOSIS — T8249XD Other complication of vascular dialysis catheter, subsequent encounter: Secondary | ICD-10-CM | POA: Diagnosis not present

## 2023-08-22 DIAGNOSIS — N186 End stage renal disease: Secondary | ICD-10-CM | POA: Diagnosis not present

## 2023-08-22 DIAGNOSIS — D631 Anemia in chronic kidney disease: Secondary | ICD-10-CM | POA: Diagnosis not present

## 2023-08-22 DIAGNOSIS — Z992 Dependence on renal dialysis: Secondary | ICD-10-CM | POA: Diagnosis not present

## 2023-08-22 DIAGNOSIS — D509 Iron deficiency anemia, unspecified: Secondary | ICD-10-CM | POA: Diagnosis not present

## 2023-08-22 DIAGNOSIS — M19041 Primary osteoarthritis, right hand: Secondary | ICD-10-CM | POA: Diagnosis not present

## 2023-08-22 DIAGNOSIS — G8929 Other chronic pain: Secondary | ICD-10-CM | POA: Diagnosis not present

## 2023-08-26 DIAGNOSIS — Z992 Dependence on renal dialysis: Secondary | ICD-10-CM | POA: Diagnosis not present

## 2023-08-26 DIAGNOSIS — D509 Iron deficiency anemia, unspecified: Secondary | ICD-10-CM | POA: Diagnosis not present

## 2023-08-26 DIAGNOSIS — D631 Anemia in chronic kidney disease: Secondary | ICD-10-CM | POA: Diagnosis not present

## 2023-08-26 DIAGNOSIS — T8249XD Other complication of vascular dialysis catheter, subsequent encounter: Secondary | ICD-10-CM | POA: Diagnosis not present

## 2023-08-26 DIAGNOSIS — N186 End stage renal disease: Secondary | ICD-10-CM | POA: Diagnosis not present

## 2023-08-26 DIAGNOSIS — N2581 Secondary hyperparathyroidism of renal origin: Secondary | ICD-10-CM | POA: Diagnosis not present

## 2023-08-31 DIAGNOSIS — N186 End stage renal disease: Secondary | ICD-10-CM | POA: Diagnosis not present

## 2023-08-31 DIAGNOSIS — N2581 Secondary hyperparathyroidism of renal origin: Secondary | ICD-10-CM | POA: Diagnosis not present

## 2023-08-31 DIAGNOSIS — T8249XD Other complication of vascular dialysis catheter, subsequent encounter: Secondary | ICD-10-CM | POA: Diagnosis not present

## 2023-08-31 DIAGNOSIS — D509 Iron deficiency anemia, unspecified: Secondary | ICD-10-CM | POA: Diagnosis not present

## 2023-08-31 DIAGNOSIS — D631 Anemia in chronic kidney disease: Secondary | ICD-10-CM | POA: Diagnosis not present

## 2023-08-31 DIAGNOSIS — Z992 Dependence on renal dialysis: Secondary | ICD-10-CM | POA: Diagnosis not present

## 2023-09-02 DIAGNOSIS — I129 Hypertensive chronic kidney disease with stage 1 through stage 4 chronic kidney disease, or unspecified chronic kidney disease: Secondary | ICD-10-CM | POA: Diagnosis not present

## 2023-09-02 DIAGNOSIS — Z992 Dependence on renal dialysis: Secondary | ICD-10-CM | POA: Diagnosis not present

## 2023-09-02 DIAGNOSIS — N186 End stage renal disease: Secondary | ICD-10-CM | POA: Diagnosis not present

## 2023-09-04 DIAGNOSIS — Z89612 Acquired absence of left leg above knee: Secondary | ICD-10-CM | POA: Diagnosis not present

## 2023-09-04 DIAGNOSIS — I96 Gangrene, not elsewhere classified: Secondary | ICD-10-CM | POA: Diagnosis not present

## 2023-09-05 DIAGNOSIS — D631 Anemia in chronic kidney disease: Secondary | ICD-10-CM | POA: Diagnosis not present

## 2023-09-05 DIAGNOSIS — D509 Iron deficiency anemia, unspecified: Secondary | ICD-10-CM | POA: Diagnosis not present

## 2023-09-05 DIAGNOSIS — R52 Pain, unspecified: Secondary | ICD-10-CM | POA: Diagnosis not present

## 2023-09-05 DIAGNOSIS — N2581 Secondary hyperparathyroidism of renal origin: Secondary | ICD-10-CM | POA: Diagnosis not present

## 2023-09-05 DIAGNOSIS — T8249XD Other complication of vascular dialysis catheter, subsequent encounter: Secondary | ICD-10-CM | POA: Diagnosis not present

## 2023-09-05 DIAGNOSIS — N186 End stage renal disease: Secondary | ICD-10-CM | POA: Diagnosis not present

## 2023-09-05 DIAGNOSIS — Z992 Dependence on renal dialysis: Secondary | ICD-10-CM | POA: Diagnosis not present

## 2023-09-07 ENCOUNTER — Telehealth (INDEPENDENT_AMBULATORY_CARE_PROVIDER_SITE_OTHER): Payer: Self-pay | Admitting: Primary Care

## 2023-09-07 NOTE — Telephone Encounter (Signed)
 Called pt to confirm an appt that could be rescheduled. Please call us  back so that we can reschedule appt with pt.

## 2023-09-08 DIAGNOSIS — I5022 Chronic systolic (congestive) heart failure: Secondary | ICD-10-CM | POA: Diagnosis not present

## 2023-09-08 DIAGNOSIS — M5451 Vertebrogenic low back pain: Secondary | ICD-10-CM | POA: Diagnosis not present

## 2023-09-08 DIAGNOSIS — N189 Chronic kidney disease, unspecified: Secondary | ICD-10-CM | POA: Diagnosis not present

## 2023-09-08 DIAGNOSIS — R531 Weakness: Secondary | ICD-10-CM | POA: Diagnosis not present

## 2023-09-09 DIAGNOSIS — T8249XD Other complication of vascular dialysis catheter, subsequent encounter: Secondary | ICD-10-CM | POA: Diagnosis not present

## 2023-09-09 DIAGNOSIS — D509 Iron deficiency anemia, unspecified: Secondary | ICD-10-CM | POA: Diagnosis not present

## 2023-09-09 DIAGNOSIS — N2581 Secondary hyperparathyroidism of renal origin: Secondary | ICD-10-CM | POA: Diagnosis not present

## 2023-09-09 DIAGNOSIS — D631 Anemia in chronic kidney disease: Secondary | ICD-10-CM | POA: Diagnosis not present

## 2023-09-09 DIAGNOSIS — N186 End stage renal disease: Secondary | ICD-10-CM | POA: Diagnosis not present

## 2023-09-09 DIAGNOSIS — R52 Pain, unspecified: Secondary | ICD-10-CM | POA: Diagnosis not present

## 2023-09-09 DIAGNOSIS — Z992 Dependence on renal dialysis: Secondary | ICD-10-CM | POA: Diagnosis not present

## 2023-09-14 DIAGNOSIS — R52 Pain, unspecified: Secondary | ICD-10-CM | POA: Diagnosis not present

## 2023-09-14 DIAGNOSIS — D509 Iron deficiency anemia, unspecified: Secondary | ICD-10-CM | POA: Diagnosis not present

## 2023-09-14 DIAGNOSIS — D631 Anemia in chronic kidney disease: Secondary | ICD-10-CM | POA: Diagnosis not present

## 2023-09-14 DIAGNOSIS — Z992 Dependence on renal dialysis: Secondary | ICD-10-CM | POA: Diagnosis not present

## 2023-09-14 DIAGNOSIS — T8249XD Other complication of vascular dialysis catheter, subsequent encounter: Secondary | ICD-10-CM | POA: Diagnosis not present

## 2023-09-14 DIAGNOSIS — N2581 Secondary hyperparathyroidism of renal origin: Secondary | ICD-10-CM | POA: Diagnosis not present

## 2023-09-14 DIAGNOSIS — N186 End stage renal disease: Secondary | ICD-10-CM | POA: Diagnosis not present

## 2023-09-16 DIAGNOSIS — Z992 Dependence on renal dialysis: Secondary | ICD-10-CM | POA: Diagnosis not present

## 2023-09-16 DIAGNOSIS — R52 Pain, unspecified: Secondary | ICD-10-CM | POA: Diagnosis not present

## 2023-09-16 DIAGNOSIS — N2581 Secondary hyperparathyroidism of renal origin: Secondary | ICD-10-CM | POA: Diagnosis not present

## 2023-09-16 DIAGNOSIS — N186 End stage renal disease: Secondary | ICD-10-CM | POA: Diagnosis not present

## 2023-09-16 DIAGNOSIS — T8249XD Other complication of vascular dialysis catheter, subsequent encounter: Secondary | ICD-10-CM | POA: Diagnosis not present

## 2023-09-16 DIAGNOSIS — D631 Anemia in chronic kidney disease: Secondary | ICD-10-CM | POA: Diagnosis not present

## 2023-09-16 DIAGNOSIS — D509 Iron deficiency anemia, unspecified: Secondary | ICD-10-CM | POA: Diagnosis not present

## 2023-09-21 DIAGNOSIS — N2581 Secondary hyperparathyroidism of renal origin: Secondary | ICD-10-CM | POA: Diagnosis not present

## 2023-09-21 DIAGNOSIS — D631 Anemia in chronic kidney disease: Secondary | ICD-10-CM | POA: Diagnosis not present

## 2023-09-21 DIAGNOSIS — Z992 Dependence on renal dialysis: Secondary | ICD-10-CM | POA: Diagnosis not present

## 2023-09-21 DIAGNOSIS — R52 Pain, unspecified: Secondary | ICD-10-CM | POA: Diagnosis not present

## 2023-09-21 DIAGNOSIS — T8249XD Other complication of vascular dialysis catheter, subsequent encounter: Secondary | ICD-10-CM | POA: Diagnosis not present

## 2023-09-21 DIAGNOSIS — N186 End stage renal disease: Secondary | ICD-10-CM | POA: Diagnosis not present

## 2023-09-21 DIAGNOSIS — D509 Iron deficiency anemia, unspecified: Secondary | ICD-10-CM | POA: Diagnosis not present

## 2023-09-22 DIAGNOSIS — G8929 Other chronic pain: Secondary | ICD-10-CM | POA: Diagnosis not present

## 2023-09-22 DIAGNOSIS — M19041 Primary osteoarthritis, right hand: Secondary | ICD-10-CM | POA: Diagnosis not present

## 2023-09-23 DIAGNOSIS — N2581 Secondary hyperparathyroidism of renal origin: Secondary | ICD-10-CM | POA: Diagnosis not present

## 2023-09-23 DIAGNOSIS — R52 Pain, unspecified: Secondary | ICD-10-CM | POA: Diagnosis not present

## 2023-09-23 DIAGNOSIS — N186 End stage renal disease: Secondary | ICD-10-CM | POA: Diagnosis not present

## 2023-09-23 DIAGNOSIS — D509 Iron deficiency anemia, unspecified: Secondary | ICD-10-CM | POA: Diagnosis not present

## 2023-09-23 DIAGNOSIS — Z992 Dependence on renal dialysis: Secondary | ICD-10-CM | POA: Diagnosis not present

## 2023-09-23 DIAGNOSIS — D631 Anemia in chronic kidney disease: Secondary | ICD-10-CM | POA: Diagnosis not present

## 2023-09-23 DIAGNOSIS — T8249XD Other complication of vascular dialysis catheter, subsequent encounter: Secondary | ICD-10-CM | POA: Diagnosis not present

## 2023-09-28 ENCOUNTER — Encounter (HOSPITAL_COMMUNITY): Payer: Self-pay | Admitting: Emergency Medicine

## 2023-09-28 ENCOUNTER — Emergency Department (HOSPITAL_COMMUNITY)

## 2023-09-28 ENCOUNTER — Inpatient Hospital Stay (HOSPITAL_COMMUNITY)
Admission: EM | Admit: 2023-09-28 | Discharge: 2023-11-03 | DRG: 871 | Disposition: E | Attending: Internal Medicine | Admitting: Internal Medicine

## 2023-09-28 ENCOUNTER — Inpatient Hospital Stay (HOSPITAL_COMMUNITY)

## 2023-09-28 ENCOUNTER — Other Ambulatory Visit: Payer: Self-pay

## 2023-09-28 DIAGNOSIS — J9811 Atelectasis: Secondary | ICD-10-CM | POA: Diagnosis present

## 2023-09-28 DIAGNOSIS — G9341 Metabolic encephalopathy: Secondary | ICD-10-CM | POA: Diagnosis not present

## 2023-09-28 DIAGNOSIS — D631 Anemia in chronic kidney disease: Secondary | ICD-10-CM | POA: Diagnosis present

## 2023-09-28 DIAGNOSIS — I482 Chronic atrial fibrillation, unspecified: Secondary | ICD-10-CM | POA: Diagnosis present

## 2023-09-28 DIAGNOSIS — I4892 Unspecified atrial flutter: Secondary | ICD-10-CM | POA: Diagnosis present

## 2023-09-28 DIAGNOSIS — I9589 Other hypotension: Secondary | ICD-10-CM | POA: Diagnosis present

## 2023-09-28 DIAGNOSIS — I2489 Other forms of acute ischemic heart disease: Secondary | ICD-10-CM | POA: Diagnosis not present

## 2023-09-28 DIAGNOSIS — I739 Peripheral vascular disease, unspecified: Secondary | ICD-10-CM | POA: Diagnosis not present

## 2023-09-28 DIAGNOSIS — L03317 Cellulitis of buttock: Secondary | ICD-10-CM | POA: Diagnosis present

## 2023-09-28 DIAGNOSIS — L97516 Non-pressure chronic ulcer of other part of right foot with bone involvement without evidence of necrosis: Secondary | ICD-10-CM | POA: Diagnosis present

## 2023-09-28 DIAGNOSIS — Z7901 Long term (current) use of anticoagulants: Secondary | ICD-10-CM

## 2023-09-28 DIAGNOSIS — H548 Legal blindness, as defined in USA: Secondary | ICD-10-CM | POA: Diagnosis present

## 2023-09-28 DIAGNOSIS — J9622 Acute and chronic respiratory failure with hypercapnia: Secondary | ICD-10-CM | POA: Diagnosis not present

## 2023-09-28 DIAGNOSIS — R5381 Other malaise: Secondary | ICD-10-CM | POA: Diagnosis present

## 2023-09-28 DIAGNOSIS — Z8679 Personal history of other diseases of the circulatory system: Secondary | ICD-10-CM

## 2023-09-28 DIAGNOSIS — D6959 Other secondary thrombocytopenia: Secondary | ICD-10-CM | POA: Diagnosis present

## 2023-09-28 DIAGNOSIS — E46 Unspecified protein-calorie malnutrition: Secondary | ICD-10-CM | POA: Diagnosis present

## 2023-09-28 DIAGNOSIS — E8779 Other fluid overload: Secondary | ICD-10-CM | POA: Diagnosis not present

## 2023-09-28 DIAGNOSIS — Z79899 Other long term (current) drug therapy: Secondary | ICD-10-CM

## 2023-09-28 DIAGNOSIS — L03116 Cellulitis of left lower limb: Secondary | ICD-10-CM

## 2023-09-28 DIAGNOSIS — Z66 Do not resuscitate: Secondary | ICD-10-CM | POA: Diagnosis not present

## 2023-09-28 DIAGNOSIS — N186 End stage renal disease: Secondary | ICD-10-CM | POA: Diagnosis present

## 2023-09-28 DIAGNOSIS — I71019 Dissection of thoracic aorta, unspecified: Secondary | ICD-10-CM | POA: Diagnosis present

## 2023-09-28 DIAGNOSIS — T8149XA Infection following a procedure, other surgical site, initial encounter: Secondary | ICD-10-CM | POA: Diagnosis not present

## 2023-09-28 DIAGNOSIS — J9611 Chronic respiratory failure with hypoxia: Secondary | ICD-10-CM | POA: Diagnosis present

## 2023-09-28 DIAGNOSIS — I4510 Unspecified right bundle-branch block: Secondary | ICD-10-CM | POA: Diagnosis not present

## 2023-09-28 DIAGNOSIS — I480 Paroxysmal atrial fibrillation: Secondary | ICD-10-CM | POA: Diagnosis present

## 2023-09-28 DIAGNOSIS — E872 Acidosis, unspecified: Secondary | ICD-10-CM | POA: Diagnosis present

## 2023-09-28 DIAGNOSIS — Z681 Body mass index (BMI) 19 or less, adult: Secondary | ICD-10-CM | POA: Diagnosis not present

## 2023-09-28 DIAGNOSIS — R6520 Severe sepsis without septic shock: Principal | ICD-10-CM | POA: Diagnosis present

## 2023-09-28 DIAGNOSIS — L97909 Non-pressure chronic ulcer of unspecified part of unspecified lower leg with unspecified severity: Secondary | ICD-10-CM | POA: Diagnosis not present

## 2023-09-28 DIAGNOSIS — I700 Atherosclerosis of aorta: Secondary | ICD-10-CM | POA: Diagnosis not present

## 2023-09-28 DIAGNOSIS — J811 Chronic pulmonary edema: Secondary | ICD-10-CM | POA: Diagnosis not present

## 2023-09-28 DIAGNOSIS — I96 Gangrene, not elsewhere classified: Secondary | ICD-10-CM | POA: Diagnosis present

## 2023-09-28 DIAGNOSIS — Z7189 Other specified counseling: Secondary | ICD-10-CM | POA: Diagnosis not present

## 2023-09-28 DIAGNOSIS — I517 Cardiomegaly: Secondary | ICD-10-CM | POA: Diagnosis not present

## 2023-09-28 DIAGNOSIS — A419 Sepsis, unspecified organism: Principal | ICD-10-CM | POA: Diagnosis present

## 2023-09-28 DIAGNOSIS — L8990 Pressure ulcer of unspecified site, unspecified stage: Secondary | ICD-10-CM | POA: Insufficient documentation

## 2023-09-28 DIAGNOSIS — I953 Hypotension of hemodialysis: Secondary | ICD-10-CM | POA: Diagnosis not present

## 2023-09-28 DIAGNOSIS — R0902 Hypoxemia: Secondary | ICD-10-CM | POA: Diagnosis not present

## 2023-09-28 DIAGNOSIS — I4891 Unspecified atrial fibrillation: Secondary | ICD-10-CM | POA: Diagnosis not present

## 2023-09-28 DIAGNOSIS — E162 Hypoglycemia, unspecified: Secondary | ICD-10-CM | POA: Diagnosis not present

## 2023-09-28 DIAGNOSIS — I70261 Atherosclerosis of native arteries of extremities with gangrene, right leg: Secondary | ICD-10-CM | POA: Diagnosis present

## 2023-09-28 DIAGNOSIS — M869 Osteomyelitis, unspecified: Secondary | ICD-10-CM | POA: Diagnosis present

## 2023-09-28 DIAGNOSIS — S81801A Unspecified open wound, right lower leg, initial encounter: Secondary | ICD-10-CM | POA: Diagnosis not present

## 2023-09-28 DIAGNOSIS — R627 Adult failure to thrive: Secondary | ICD-10-CM | POA: Diagnosis present

## 2023-09-28 DIAGNOSIS — R636 Underweight: Secondary | ICD-10-CM | POA: Diagnosis present

## 2023-09-28 DIAGNOSIS — Z992 Dependence on renal dialysis: Secondary | ICD-10-CM | POA: Diagnosis not present

## 2023-09-28 DIAGNOSIS — I77810 Thoracic aortic ectasia: Secondary | ICD-10-CM | POA: Diagnosis present

## 2023-09-28 DIAGNOSIS — E875 Hyperkalemia: Secondary | ICD-10-CM | POA: Diagnosis not present

## 2023-09-28 DIAGNOSIS — Z89612 Acquired absence of left leg above knee: Secondary | ICD-10-CM | POA: Diagnosis not present

## 2023-09-28 DIAGNOSIS — Z8701 Personal history of pneumonia (recurrent): Secondary | ICD-10-CM

## 2023-09-28 DIAGNOSIS — I441 Atrioventricular block, second degree: Secondary | ICD-10-CM | POA: Diagnosis present

## 2023-09-28 DIAGNOSIS — K429 Umbilical hernia without obstruction or gangrene: Secondary | ICD-10-CM | POA: Diagnosis not present

## 2023-09-28 DIAGNOSIS — Z823 Family history of stroke: Secondary | ICD-10-CM

## 2023-09-28 DIAGNOSIS — L0390 Cellulitis, unspecified: Secondary | ICD-10-CM | POA: Diagnosis present

## 2023-09-28 DIAGNOSIS — R34 Anuria and oliguria: Secondary | ICD-10-CM | POA: Diagnosis present

## 2023-09-28 DIAGNOSIS — Z8349 Family history of other endocrine, nutritional and metabolic diseases: Secondary | ICD-10-CM

## 2023-09-28 DIAGNOSIS — Z881 Allergy status to other antibiotic agents status: Secondary | ICD-10-CM

## 2023-09-28 DIAGNOSIS — G8389 Other specified paralytic syndromes: Secondary | ICD-10-CM | POA: Diagnosis present

## 2023-09-28 DIAGNOSIS — H3553 Other dystrophies primarily involving the sensory retina: Secondary | ICD-10-CM | POA: Diagnosis present

## 2023-09-28 DIAGNOSIS — J810 Acute pulmonary edema: Secondary | ICD-10-CM | POA: Diagnosis not present

## 2023-09-28 DIAGNOSIS — J9621 Acute and chronic respiratory failure with hypoxia: Secondary | ICD-10-CM | POA: Diagnosis not present

## 2023-09-28 DIAGNOSIS — Z515 Encounter for palliative care: Secondary | ICD-10-CM

## 2023-09-28 DIAGNOSIS — G629 Polyneuropathy, unspecified: Secondary | ICD-10-CM | POA: Diagnosis present

## 2023-09-28 DIAGNOSIS — M8580 Other specified disorders of bone density and structure, unspecified site: Secondary | ICD-10-CM | POA: Diagnosis present

## 2023-09-28 DIAGNOSIS — L97219 Non-pressure chronic ulcer of right calf with unspecified severity: Secondary | ICD-10-CM | POA: Diagnosis not present

## 2023-09-28 DIAGNOSIS — L03315 Cellulitis of perineum: Secondary | ICD-10-CM | POA: Diagnosis present

## 2023-09-28 DIAGNOSIS — R102 Pelvic and perineal pain: Secondary | ICD-10-CM

## 2023-09-28 DIAGNOSIS — Z888 Allergy status to other drugs, medicaments and biological substances status: Secondary | ICD-10-CM

## 2023-09-28 DIAGNOSIS — R531 Weakness: Secondary | ICD-10-CM | POA: Diagnosis present

## 2023-09-28 DIAGNOSIS — I120 Hypertensive chronic kidney disease with stage 5 chronic kidney disease or end stage renal disease: Secondary | ICD-10-CM | POA: Diagnosis present

## 2023-09-28 DIAGNOSIS — I498 Other specified cardiac arrhythmias: Secondary | ICD-10-CM | POA: Diagnosis not present

## 2023-09-28 DIAGNOSIS — G9349 Other encephalopathy: Secondary | ICD-10-CM | POA: Diagnosis not present

## 2023-09-28 DIAGNOSIS — R918 Other nonspecific abnormal finding of lung field: Secondary | ICD-10-CM | POA: Diagnosis not present

## 2023-09-28 DIAGNOSIS — I129 Hypertensive chronic kidney disease with stage 1 through stage 4 chronic kidney disease, or unspecified chronic kidney disease: Secondary | ICD-10-CM | POA: Diagnosis not present

## 2023-09-28 DIAGNOSIS — Z9981 Dependence on supplemental oxygen: Secondary | ICD-10-CM

## 2023-09-28 DIAGNOSIS — L03115 Cellulitis of right lower limb: Secondary | ICD-10-CM | POA: Diagnosis present

## 2023-09-28 DIAGNOSIS — S99921A Unspecified injury of right foot, initial encounter: Secondary | ICD-10-CM | POA: Diagnosis not present

## 2023-09-28 DIAGNOSIS — I83008 Varicose veins of unspecified lower extremity with ulcer other part of lower leg: Secondary | ICD-10-CM | POA: Diagnosis not present

## 2023-09-28 DIAGNOSIS — N250 Renal osteodystrophy: Secondary | ICD-10-CM | POA: Diagnosis not present

## 2023-09-28 DIAGNOSIS — L089 Local infection of the skin and subcutaneous tissue, unspecified: Secondary | ICD-10-CM

## 2023-09-28 DIAGNOSIS — M85861 Other specified disorders of bone density and structure, right lower leg: Secondary | ICD-10-CM | POA: Diagnosis not present

## 2023-09-28 DIAGNOSIS — Z8249 Family history of ischemic heart disease and other diseases of the circulatory system: Secondary | ICD-10-CM

## 2023-09-28 DIAGNOSIS — I70242 Atherosclerosis of native arteries of left leg with ulceration of calf: Secondary | ICD-10-CM | POA: Diagnosis not present

## 2023-09-28 DIAGNOSIS — Z5320 Procedure and treatment not carried out because of patient's decision for unspecified reasons: Secondary | ICD-10-CM | POA: Diagnosis not present

## 2023-09-28 DIAGNOSIS — Z91158 Patient's noncompliance with renal dialysis for other reason: Secondary | ICD-10-CM

## 2023-09-28 DIAGNOSIS — I10 Essential (primary) hypertension: Secondary | ICD-10-CM | POA: Diagnosis not present

## 2023-09-28 LAB — COMPREHENSIVE METABOLIC PANEL WITH GFR
ALT: 9 U/L (ref 0–44)
AST: 14 U/L — ABNORMAL LOW (ref 15–41)
Albumin: 3.3 g/dL — ABNORMAL LOW (ref 3.5–5.0)
Alkaline Phosphatase: 230 U/L — ABNORMAL HIGH (ref 38–126)
Anion gap: 19 — ABNORMAL HIGH (ref 5–15)
BUN: 67 mg/dL — ABNORMAL HIGH (ref 6–20)
CO2: 25 mmol/L (ref 22–32)
Calcium: 9 mg/dL (ref 8.9–10.3)
Chloride: 100 mmol/L (ref 98–111)
Creatinine, Ser: 10.36 mg/dL — ABNORMAL HIGH (ref 0.61–1.24)
GFR, Estimated: 5 mL/min — ABNORMAL LOW (ref >60)
Glucose, Bld: 95 mg/dL (ref 70–99)
Potassium: 4.6 mmol/L (ref 3.5–5.1)
Sodium: 144 mmol/L (ref 135–145)
Total Bilirubin: 1 mg/dL (ref 0.0–1.2)
Total Protein: 7.2 g/dL (ref 6.5–8.1)

## 2023-09-28 LAB — CBC WITH DIFFERENTIAL/PLATELET
Abs Immature Granulocytes: 0.03 10*3/uL (ref 0.00–0.07)
Basophils Absolute: 0 10*3/uL (ref 0.0–0.1)
Basophils Relative: 0 %
Eosinophils Absolute: 0.1 10*3/uL (ref 0.0–0.5)
Eosinophils Relative: 1 %
HCT: 38.7 % — ABNORMAL LOW (ref 39.0–52.0)
Hemoglobin: 11.1 g/dL — ABNORMAL LOW (ref 13.0–17.0)
Immature Granulocytes: 0 %
Lymphocytes Relative: 9 %
Lymphs Abs: 0.6 10*3/uL — ABNORMAL LOW (ref 0.7–4.0)
MCH: 27.8 pg (ref 26.0–34.0)
MCHC: 28.7 g/dL — ABNORMAL LOW (ref 30.0–36.0)
MCV: 96.8 fL (ref 80.0–100.0)
Monocytes Absolute: 0.5 10*3/uL (ref 0.1–1.0)
Monocytes Relative: 7 %
Neutro Abs: 6.1 10*3/uL (ref 1.7–7.7)
Neutrophils Relative %: 83 %
Platelets: 127 10*3/uL — ABNORMAL LOW (ref 150–400)
RBC: 4 MIL/uL — ABNORMAL LOW (ref 4.22–5.81)
RDW: 16.9 % — ABNORMAL HIGH (ref 11.5–15.5)
WBC: 7.3 10*3/uL (ref 4.0–10.5)
nRBC: 0 % (ref 0.0–0.2)

## 2023-09-28 LAB — HEPATITIS B SURFACE ANTIGEN: Hepatitis B Surface Ag: NONREACTIVE

## 2023-09-28 LAB — C-REACTIVE PROTEIN: CRP: 2.1 mg/dL — ABNORMAL HIGH (ref <1.0)

## 2023-09-28 LAB — I-STAT CG4 LACTIC ACID, ED
Lactic Acid, Venous: 3.2 mmol/L (ref 0.5–1.9)
Lactic Acid, Venous: 3 mmol/L (ref 0.5–1.9)

## 2023-09-28 LAB — PROTIME-INR
INR: 1.6 — ABNORMAL HIGH (ref 0.8–1.2)
Prothrombin Time: 19.8 s — ABNORMAL HIGH (ref 11.4–15.2)

## 2023-09-28 LAB — APTT: aPTT: 40 s — ABNORMAL HIGH (ref 24–36)

## 2023-09-28 LAB — SEDIMENTATION RATE: Sed Rate: 13 mm/h (ref 0–16)

## 2023-09-28 MED ORDER — FENTANYL CITRATE PF 50 MCG/ML IJ SOSY
25.0000 ug | PREFILLED_SYRINGE | INTRAMUSCULAR | Status: DC | PRN
  Administered 2023-09-29 – 2023-10-01 (×3): 25 ug via INTRAVENOUS
  Filled 2023-09-28 (×2): qty 1

## 2023-09-28 MED ORDER — VANCOMYCIN HCL 750 MG/150ML IV SOLN
750.0000 mg | INTRAVENOUS | Status: DC
  Filled 2023-09-28: qty 150

## 2023-09-28 MED ORDER — LACTATED RINGERS IV SOLN: INTRAVENOUS | Status: DC

## 2023-09-28 MED ORDER — IOHEXOL 350 MG/ML SOLN
75.0000 mL | Freq: Once | INTRAVENOUS | Status: AC | PRN
  Administered 2023-09-28: 75 mL via INTRAVENOUS

## 2023-09-28 MED ORDER — ARGATROBAN 50 MG/50ML IV SOLN
0.6000 ug/kg/min | INTRAVENOUS | Status: DC
  Administered 2023-09-28: 1 ug/kg/min via INTRAVENOUS
  Filled 2023-09-28 (×4): qty 50

## 2023-09-28 MED ORDER — ACETAMINOPHEN 650 MG RE SUPP: 650.0000 mg | Freq: Four times a day (QID) | RECTAL | Status: DC | PRN

## 2023-09-28 MED ORDER — CHLORHEXIDINE GLUCONATE CLOTH 2 % EX PADS
6.0000 | MEDICATED_PAD | Freq: Every day | CUTANEOUS | Status: DC
  Administered 2023-09-29 – 2023-10-03 (×5): 6 via TOPICAL

## 2023-09-28 MED ORDER — FENTANYL CITRATE PF 50 MCG/ML IJ SOSY
50.0000 ug | PREFILLED_SYRINGE | Freq: Once | INTRAMUSCULAR | Status: AC
  Administered 2023-09-28: 50 ug via INTRAVENOUS
  Filled 2023-09-28: qty 1

## 2023-09-28 MED ORDER — OXYCODONE-ACETAMINOPHEN 5-325 MG PO TABS
1.0000 | ORAL_TABLET | ORAL | Status: DC | PRN
  Administered 2023-09-28 – 2023-10-04 (×13): 1 via ORAL
  Filled 2023-09-28 (×13): qty 1

## 2023-09-28 MED ORDER — SODIUM CHLORIDE 0.9% FLUSH
3.0000 mL | Freq: Two times a day (BID) | INTRAVENOUS | Status: DC
  Administered 2023-09-28 – 2023-10-03 (×9): 3 mL via INTRAVENOUS

## 2023-09-28 MED ORDER — VANCOMYCIN HCL 1250 MG/250ML IV SOLN
1250.0000 mg | Freq: Once | INTRAVENOUS | Status: AC
  Administered 2023-09-28: 1250 mg via INTRAVENOUS
  Filled 2023-09-28: qty 250

## 2023-09-28 MED ORDER — ACETAMINOPHEN 325 MG PO TABS
650.0000 mg | ORAL_TABLET | Freq: Four times a day (QID) | ORAL | Status: DC | PRN
  Administered 2023-10-02: 650 mg via ORAL
  Filled 2023-09-28: qty 2

## 2023-09-28 MED ORDER — ALBUTEROL SULFATE (2.5 MG/3ML) 0.083% IN NEBU: 2.5000 mg | INHALATION_SOLUTION | RESPIRATORY_TRACT | Status: DC | PRN

## 2023-09-28 MED ORDER — ONDANSETRON HCL 4 MG/2ML IJ SOLN
4.0000 mg | Freq: Once | INTRAMUSCULAR | Status: AC
  Administered 2023-09-28: 4 mg via INTRAVENOUS
  Filled 2023-09-28: qty 2

## 2023-09-28 MED ORDER — MIDODRINE HCL 5 MG PO TABS
15.0000 mg | ORAL_TABLET | Freq: Three times a day (TID) | ORAL | Status: DC
  Administered 2023-09-28 – 2023-10-01 (×7): 15 mg via ORAL
  Filled 2023-09-28 (×9): qty 3

## 2023-09-28 MED ORDER — SODIUM CHLORIDE 0.9 % IV SOLN
1.0000 g | INTRAVENOUS | Status: DC
  Administered 2023-09-29: 1 g via INTRAVENOUS
  Filled 2023-09-28 (×2): qty 10

## 2023-09-28 MED ORDER — SODIUM CHLORIDE 0.9 % IV SOLN
2.0000 g | Freq: Once | INTRAVENOUS | Status: AC
  Administered 2023-09-28: 2 g via INTRAVENOUS
  Filled 2023-09-28: qty 12.5

## 2023-09-28 MED ORDER — SODIUM CHLORIDE 0.9 % IV BOLUS
500.0000 mL | Freq: Once | INTRAVENOUS | Status: AC
  Administered 2023-09-28: 500 mL via INTRAVENOUS

## 2023-09-28 MED ORDER — ALBUMIN HUMAN 25 % IV SOLN
25.0000 g | INTRAVENOUS | Status: AC | PRN
  Administered 2023-09-29: 25 g via INTRAVENOUS

## 2023-09-28 NOTE — ED Triage Notes (Signed)
 Patient BIB [facility] from home c/o wound on right leg that has been getting progressively worse over the past week.  Patient has been feeling weak and fatigued.  Patient did miss dialysis today because he was too weak to go.  Patient has trialysis cath.  4 L [place] [facility].    104/67 90 hr 100% 130 cbg

## 2023-09-28 NOTE — ED Provider Notes (Signed)
 [place] EMERGENCY DEPARTMENT AT [facility] Provider Note   CSN: [id number] Arrival date & time: 09/28/23  1326     Patient presents with: Wound Infection   Corey [name] is a 53 y.o. male.    Patient with history of ESRD (last session 5 days ago), hypertension, heart failure, chronic A-fib, calciphylaxis, chronic respiratory failure on oxygen --presents with worsening leg wound over the past several weeks.  He has had increased drainage.  He has had generalized weakness, hot flashes.  Pain and drainage were worse today, prompting emergency department visit.  Denies documented fevers.  Patient reports last dialysis session was 6/21.  He does not feel appreciably short of breath.   Previous ABG done April 2025 --technically difficult due to thickened skin of lower extremity.  Patient reports (later in stay) a lesion on the tip of his penis and some tenderness to the perineal area.  States that the penile lesion has been present for several months and that the perineal area has been more sore over the past couple of weeks.       Prior to Admission medications   Medication Sig Start Date End Date Taking? Authorizing Provider  acetaminophen  (TYLENOL ) 325 MG tablet Take 2 tablets (650 mg total) by mouth 4 (four) times daily -  with meals and at bedtime. 12/01/22   Love, Sharlet RAMAN, PA-C  amiodarone  (PACERONE ) 200 MG tablet Take 1 tablet (200 mg total) by mouth daily. 01/20/23   Jeffrie Oneil BROCKS, MD  apixaban  (ELIQUIS ) 2.5 MG TABS tablet Take 1 tablet (2.5 mg total) by mouth 2 (two) times daily. 02/20/23   Jeffrie Oneil BROCKS, MD  bacitracin  ointment Apply topically 2 (two) times daily. 12/13/22   Love, Sharlet RAMAN, PA-C  camphor-menthol  [facility]) lotion Apply 1 Application topically every 8 (eight) hours as needed for itching. 11/29/22   Jillian Buttery, MD  cinacalcet  (SENSIPAR ) 30 MG tablet Take 1 tablet (30 mg total) by mouth daily with supper. 12/13/22   Love, Sharlet RAMAN, PA-C  diclofenac   Sodium (VOLTAREN ) 1 % GEL Apply 2 g topically 4 (four) times daily as needed (Arthritis). 06/07/23   Newlin, Enobong, MD  docusate sodium  (COLACE) 100 MG capsule Take 1 capsule (100 mg total) by mouth daily. 12/14/22   Love, Sharlet RAMAN, PA-C  DULoxetine  (CYMBALTA ) 20 MG capsule Take 1 capsule (20 mg total) by mouth daily. 06/07/23   Newlin, Enobong, MD  gabapentin  (NEURONTIN ) 300 MG capsule Take 1 capsule (300 mg total) by mouth at bedtime. 06/07/23   Newlin, Enobong, MD  hydrocerin (EUCERIN) CREA Apply 1 Application topically daily. 11/29/22   Jillian Buttery, MD  lidocaine  (LIDODERM ) 5 % Place 3 patches onto the skin daily. Remove & Discard patch within 12 hours or as directed by MD 12/13/22   Love, Sharlet RAMAN, PA-C  melatonin 3 MG TABS tablet Take 1 tablet (3 mg total) by mouth at bedtime. 12/13/22   Love, Sharlet RAMAN, PA-C  methocarbamol  (ROBAXIN ) 500 MG tablet Take 1 tablet (500 mg total) by mouth every 6 (six) hours as needed for muscle spasms. 12/13/22   Love, Sharlet RAMAN, PA-C  midodrine  (PROAMATINE ) 5 MG tablet Take 3 tablets (15 mg total) by mouth 3 (three) times daily with meals. 01/20/23   Jeffrie Oneil BROCKS, MD  Multiple Vitamins-Minerals (MULTIVITAMIN WITH MINERALS) tablet Take 1 tablet by mouth daily.    [provider]  oxyCODONE -acetaminophen  (PERCOCET) 5-325 MG tablet Take 1 tablet by mouth every 6 (six) hours as needed for  severe pain (pain score 7-10). Patient not taking: Reported on 07/10/2023 03/07/23 03/06/24  Rhyne, Samantha J, PA-C  oxymetazoline  (AFRIN) 0.05 % nasal spray Place 1 spray into both nostrils 2 (two) times daily. 12/14/22   Love, Sharlet RAMAN, PA-C  polyethylene glycol powder (GLYCOLAX /MIRALAX ) 17 GM/SCOOP powder Take 1 capful (17 g) with water  by mouth 2 (two) times daily. 12/13/22   Love, Sharlet RAMAN, PA-C  senna-docusate (SENOKOT-S) 8.6-50 MG tablet Take 1 tablet by mouth 2 (two) times daily. 12/13/22   Love, Sharlet RAMAN, PA-C  sucroferric oxyhydroxide (VELPHORO ) 500 MG chewable tablet Chew 1  tablet (500 mg total) by mouth 3 (three) times daily with meals. 12/13/22   Love, Sharlet RAMAN, PA-C  zolpidem  (AMBIEN ) 5 MG tablet Take 1 tablet (5 mg total) by mouth at bedtime as needed for sleep (Insomnia). Patient not taking: Reported on 07/10/2023 12/13/22   Maurice Sharlet RAMAN, PA-C    Allergies: Ciprofloxacin, Heparin , Doxercalciferol , Tomato, and Quinolones    Review of Systems  Updated Vital Signs BP 91/63   Pulse 90   Temp 98 F (36.7 C)   Resp 11   Wt 64 kg   SpO2 100%   BMI 17.64 kg/m   Physical Exam Vitals and nursing note reviewed.  Constitutional:      General: He is not in acute distress.    Appearance: He is well-developed.  HENT:     Head: Normocephalic and atraumatic.     Nose: Nose normal.     Mouth/Throat:     Mouth: Mucous membranes are moist.   Eyes:     General:        Right eye: No discharge.        Left eye: No discharge.     Conjunctiva/sclera: Conjunctivae normal.    Cardiovascular:     Rate and Rhythm: Normal rate and regular rhythm.     Heart sounds: Normal heart sounds.  Pulmonary:     Effort: Pulmonary effort is normal. No respiratory distress.     Breath sounds: Normal breath sounds.  Abdominal:     Palpations: Abdomen is soft.     Tenderness: There is no abdominal tenderness.  Genitourinary:    Comments: Tenderness and pustule on perineum, present for several weeks per patient. No obvious Fournier's.   Hyperkeratotic lesion on tip of penis.     Musculoskeletal:     Cervical back: Normal range of motion and neck supple.   Skin:    General: Skin is warm and dry.     Comments: Previous left lower leg amputation.  Right lower extremity: Chronic changes of calciphylaxis.  There are associated open wounds with drainage, areas are very tender.  Skin is very edematous and scaling.  Generalized edema noted.    Neurological:     Mental Status: He is alert.             (all labs ordered are listed, but only abnormal results are  displayed) Labs Reviewed  COMPREHENSIVE METABOLIC PANEL WITH GFR - Abnormal; Notable for the following components:      Result Value   BUN 67 (*)    Creatinine, Ser 10.36 (*)    Albumin  3.3 (*)    AST 14 (*)    Alkaline Phosphatase 230 (*)    GFR, Estimated 5 (*)    Anion gap 19 (*)    All other components within normal limits  CBC WITH DIFFERENTIAL/PLATELET - Abnormal; Notable for the following components:   RBC 4.00 (*)  Hemoglobin 11.1 (*)    HCT 38.7 (*)    MCHC 28.7 (*)    RDW 16.9 (*)    Platelets 127 (*)    Lymphs Abs 0.6 (*)    All other components within normal limits  PROTIME-INR - Abnormal; Notable for the following components:   Prothrombin Time 19.8 (*)    INR 1.6 (*)    All other components within normal limits  I-STAT CG4 LACTIC ACID, ED - Abnormal; Notable for the following components:   Lactic Acid, Venous 3.2 (*)    All other components within normal limits  CULTURE, BLOOD (ROUTINE X 2)  CULTURE, BLOOD (ROUTINE X 2)   ED ECG REPORT   Date: 09/28/2023  Rate: 92  Rhythm: atrial flutter  QRS Axis: right  Intervals: normal  ST/T Wave abnormalities: nonspecific T wave changes  Conduction Disutrbances:right bundle branch block  Narrative Interpretation:   Old EKG Reviewed: unchanged  I have personally reviewed the EKG tracing and agree with the computerized printout as noted.   Radiology: DG Tibia/Fibula Right Result Date: 09/28/2023 CLINICAL DATA:  Possible sepsis wound right leg EXAM: RIGHT TIBIA AND FIBULA - 2 VIEW COMPARISON:  None Available. FINDINGS: Advanced vascular calcifications. Diffuse osteopenia limits assessment of osseous structures. Large wound or ulcer along the medial and posterior aspect of the mid to lower leg. Nonspecific mild periosteal reaction at the shafts of the distal tibia and fibula. IMPRESSION: Large wound or ulcer along the medial and posterior aspect of the mid to lower leg. Diffuse osteopenia limits assessment of osseous  structures. Nonspecific mild periosteal reaction at the shafts of the distal tibia and fibula. MRI may be obtained if suspicion for osseous infection Electronically Signed   By: Luke Bun M.D.   On: 09/28/2023 15:03   DG Foot Complete Right Result Date: 09/28/2023 CLINICAL DATA:  Wound EXAM: RIGHT FOOT COMPLETE - 3+ VIEW COMPARISON:  None Available. FINDINGS: Extensive vascular calcification. Marked osteopenia limits the exam. Large dorsal defect/deformity at the distal foot presumably due to wound or ulcer. Probable ulceration at the tip of the first distal phalanx. Suspicion of bony resorptive changes involving the first distal phalanx, second distal and middle phalanx, third distal and middle phalanx as well as distal portion of third proximal phalanx, and distal phalanx of fourth digit. No definite soft tissue gas. IMPRESSION: Large dorsal defect/deformity at the distal foot presumably due to wound or ulcer. Probable ulceration at the tip of the first distal phalanx. Limited assessment of osseous structures due to marked osteopenia. Bony resorptive/destructive changes involving the first through fourth digits as described above concerning for osteomyelitis. Electronically Signed   By: Luke Bun M.D.   On: 09/28/2023 15:01   DG Chest Port 1 View Result Date: 09/28/2023 CLINICAL DATA:  Possible sepsis EXAM: PORTABLE CHEST 1 VIEW COMPARISON:  12/11/2022, 10/27/2022 FINDINGS: Right-sided central venous catheter tip at the right atrial IVC junction. Cardiomegaly with aortic atherosclerosis. Diffuse increased interstitial and ground-glass opacity probably represents edema. Linear atelectasis or scarring in the lower lungs. No pleural effusion or pneumothorax. IMPRESSION: Cardiomegaly with diffuse increased interstitial and ground-glass opacity probably representing edema. Atelectasis and or scarring at the bases Electronically Signed   By: Luke Bun M.D.   On: 09/28/2023 14:54     Procedures    Medications Ordered in the ED  lactated ringers  infusion ( Intravenous New Bag/Given 09/28/23 1409)  vancomycin  (VANCOREADY) IVPB 1250 mg/250 mL (has no administration in time range)  ceFEPIme  (MAXIPIME ) 2 g  in sodium chloride  0.9 % 100 mL IVPB (2 g Intravenous New Bag/Given 09/28/23 1449)  fentaNYL  (SUBLIMAZE ) injection 50 mcg (50 mcg Intravenous Given 09/28/23 1409)  ondansetron  (ZOFRAN ) injection 4 mg (4 mg Intravenous Given 09/28/23 1411)  sodium chloride  0.9 % bolus 500 mL (0 mLs Intravenous Stopped 09/28/23 1448)   ED Course  Patient seen and examined. History obtained directly from patient.   Labs/EKG: Ordered sepsis order set including CBC, CMP, lactate, PT/INR as part of order set, blood cultures.  Imaging: Ordered lower extremity x-rays, chest x-ray.  Medications/Fluids: Ordered: Fentanyl /Zofran , will cover with Vanco and cefepime  for diabetic lower limb infection, 500 cc fluid bolus --will monitor carefully for signs of fluid overload.  Most recent vital signs reviewed and are as follows: BP 91/63   Pulse 90   Temp 98 F (36.7 C)   Resp 11   Wt 64 kg   SpO2 100%   BMI 17.64 kg/m   Initial impression: History of calciphylaxis and chronic wounds, worsening, open wounds with drainage concerning for infected wound.  Lactate is elevated.  Will give 500 cc IV fluid bolus.  Avoiding 30 cc/kg at this point due to history of ESRD, no dialysis in 5 days.  No hypoxia at this time.  3:17 PM Reassessment performed. Patient appears stable.   Labs personally reviewed and interpreted including: CBC with white blood cell count 7.3, hemoglobin at 11.1 which is actually better than the patient's baseline of 8-9, possibly some dilution due to missing dialysis recently; CMP with normal potassium of 4.6, creatinine 10.36 with a BUN of 67, anion gap 19; lactate 3.2; PT/INR 19.8 and 1.6; cultures pending.  Imaging personally visualized and interpreted including: Chest x-ray with likely some  degree of edema; x-ray of the foot and tib-fib on the right with soft tissue changes consistent with ulcerations and possible areas concerning for osteomyelitis involving the 1st-4th toes, distal tib-fib.  Reviewed pertinent lab work and imaging with patient at bedside. Questions answered.   Most current vital signs reviewed and are as follows: BP 91/63   Pulse 90   Temp 98 F (36.7 C)   Resp 11   Wt 64 kg   SpO2 100%   BMI 17.64 kg/m   Plan: Will admit to the hospitalist.  IV antibiotics already ordered.  Patient does have some perineal tenderness with a small pustule, CT ordered to evaluate for abscess.  Exam is not clinically concerning for Fournier's gangrene at this time.  3:35 PM Discussed with Dr. Claudene with [facility], requested ortho consult due to question of osteo/chronic wounds.   Spoke with Michael Jeffery PA-C and requested ortho input.   CRITICAL CARE Performed by: Fonda Ruby PA-C Total critical care time: 40 minutes Critical care time was exclusive of separately billable procedures and treating other patients. Critical care was necessary to treat or prevent imminent or life-threatening deterioration. Critical care was time spent personally by me on the following activities: development of treatment plan with patient and/or surrogate as well as nursing, discussions with consultants, evaluation of patient's response to treatment, examination of patient, obtaining history from patient or surrogate, ordering and performing treatments and interventions, ordering and review of laboratory studies, ordering and review of radiographic studies, pulse oximetry and re-evaluation of patient's condition.                                   Medical Decision Making Amount and/or  Complexity of Data Reviewed Labs: ordered. Radiology: ordered.  Risk Prescription drug management.   Patient with history of ESRD, several missed dialysis sessions, presents with weakness, apparent  right lower extremity wound.  Also with perineal pain.  Suspect sepsis due to infected ulcerations.  Patient will need routine HD.  Potassium is normal.  Question of some edema on the chest x-ray, however patient is satting well on his normal supplemental oxygen .    Final diagnoses:  Sepsis with acute organ dysfunction without septic shock, due to unspecified organism, unspecified organ dysfunction type (HCC)  Wound infection  ESRD (end stage renal disease) on dialysis [facility])  Perineum pain, male    ED Discharge Orders     None          Desiderio Chew, DEVONNA 09/28/23 1538    Doretha Folks, MD 09/29/23 1044

## 2023-09-28 NOTE — Progress Notes (Signed)
 ED Pharmacy Antibiotic Sign Off An antibiotic consult was received from an ED provider for cefepime and vancomycin  per pharmacy dosing for sepsis. A chart review was completed to assess appropriateness.  The following one time order(s) were placed per pharmacy consult:  cefepime 2000 mg x 1 dose vancomycin  1250 mg x 1 dose  Further antibiotic and/or antibiotic pharmacy consults should be ordered by the admitting provider if indicated.   Thank you for allowing pharmacy to be a part of this patient's care.   Dorn Buttner, PharmD, BCPS 09/28/2023 2:25 PM ED Clinical Pharmacist -  [phone]

## 2023-09-28 NOTE — Consult Note (Signed)
 [facility] OF [place]  ASSESSMENT / PLAN: 53 y.o. male with right lower extremity mixed ulceration. His wounds appear typical of venous ulceration. He has known peripheral arterial disease. He is not a revascularization candidate. His XR is read as suspicious for osteomyelitis of the toes, but I am not confident in this looking at this foot.  Recommend trial of Unna boot therapy to induce healing of the venous ulcers; if this is unsuccessful after several weeks, he should consider above knee amputation.  Given his overall presentation, I worry he is near the end of his life. Palliative care consult may be helpful.  CHIEF COMPLAINT: right leg wounds  HISTORY OF PRESENT ILLNESS: Corey [name] is a 53 y.o. male with ESRD, calciphlaxis, L AKA, blindness, aortic dissection, AF, lower extremity weakness, etc. Admitted to the hospital for wrosening right leg wound. The wound has been worsening over the past several weeks. The wound is painful. HE is well known to our service for dialysis access and L AKA performed last year. This was very slow to heal.    Past Medical History:  Diagnosis Date   Arthritis    hands and shoulders   Blindness and low vision    Stargardt disease   Dissection of aorta (HCC) 2004   a. s/p extensive repeair in 2004 in [place] complicated by ESRD, lower extremity paralysis, coma, and extended hospitalization of 2 years   ESRD (end stage renal disease) (HCC)    a. TTS   Headache    History of cardioversion 2014   Hypertension    Neuropathy    Non-healing non-surgical wound 03/2016   PAF (paroxysmal atrial fibrillation) (HCC)    a. s/p DCCV in 2014; b. on Coumadin ; c. CHADS2VASc => 2 (HTN, vascular disease)   Paralysis (HCC)    due to dissection of aorta in 2004, lower extremities   Pneumonia     Past Surgical History:  Procedure Laterality Date   AMPUTATION Left 11/17/2022   Procedure: AMPUTATION ABOVE KNEE;  Surgeon: Serene Gaile ORN, MD;   [facility]: [facility] OR;  Service: Vascular;  Laterality: Left;   APPLICATION OF WOUND VAC Left 04/13/2016   Procedure: APPLICATION OF WOUND VAC;  Surgeon: Redell LITTIE Door, MD;  Location[facility] OR;  Service: Vascular;  Laterality: Left;   APPLICATION OF WOUND VAC Left 04/18/2016   Procedure: APPLICATION OF WOUND VAC;  Surgeon: Penne Lonni Colorado, MD;  Location: [facility] OR;  Service: Vascular;  Laterality: Left;  Wound vac change    APPLICATION OF WOUND VAC Left 04/20/2016   Procedure: WOUND VAC CHANGE;  Surgeon: Redell LITTIE Door, MD;  Location: [facility] OR;  Service: Vascular;  Laterality: Left;   AV FISTULA PLACEMENT     BASCILIC VEIN TRANSPOSITION Left 12/09/2015   Procedure: FIRST STAGE BASILIC VEIN TRANSPOSITION LEFT UPPER ARM;  Surgeon: Redell LITTIE Door, MD;  Location: [facility] OR;  Service: Vascular;  Laterality: Left;   BASCILIC VEIN TRANSPOSITION Left 03/09/2016   Procedure: SECOND STAGE BASILIC VEIN TRANSPOSITION WITH REVISION OF ANASTOMOSIS LEFT UPPER ARM;  Surgeon: Redell LITTIE Door, MD;  Location: [facility] OR;  Service: Vascular;  Laterality: Left;   CARDIOVERSION     CARDIOVERSION N/A 01/04/2019   Procedure: CARDIOVERSION;  Surgeon: Pietro Redell RAMAN, MD;  Location: [facility] ENDOSCOPY;  Service: Cardiovascular;  Laterality: N/A;   CHEST TUBE INSERTION Left 11/02/2022   Procedure: CHEST TUBE INSERTION;  Surgeon: Arlinda Ranks, MD;  Location: [facility] ENDOSCOPY;  Service: Pulmonary;  Laterality: Left;  Please  have both Pigtail (COOK) and chest tube tray available   REPAIR OF ACUTE ASCENDING THORACIC AORTIC DISSECTION     REVISON OF ARTERIOVENOUS FISTULA Left 04/20/2016   Procedure: LIGATION OF BASILIC VEIN TRANSPOSITION;  Surgeon: Redell LITTIE Door, MD;  Location: [facility] OR;  Service: Vascular;  Laterality: Left;   WOUND DEBRIDEMENT Left 04/13/2016   Procedure: DEBRIDEMENT WOUND;  Surgeon: Redell LITTIE Door, MD;  Location: [facility] OR;  Service: Vascular;  Laterality: Left;    Family History  Problem Relation Age of Onset   Cancer Mother    Hypertension Mother     Cancer Father    Hypertension Father    Thyroid  disease Sister    Stroke Brother        63    Social History   Socioeconomic History   Marital status: Divorced    Spouse name: Not on file   Number of children: 2   Years of education: Not on file   Highest education level: Not on file  Occupational History   Occupation: DIABLED  Tobacco Use   Smoking status: Never   Smokeless tobacco: Never  Vaping Use   Vaping status: Never Used  Substance and Sexual Activity   Alcohol use: No   Drug use: No   Sexual activity: Not on file  Other Topics Concern   Not on file  Social History Narrative   Not on file   Social Drivers of Health   Financial Resource Strain: Medium Risk (09/26/2022)   Overall Financial Resource Strain (CARDIA)    Difficulty of Paying Living Expenses: Somewhat hard  Food Insecurity: No Food Insecurity (10/27/2022)   Hunger Vital Sign    Worried About Running Out of Food in the Last Year: Never true    Ran Out of Food in the Last Year: Never true  Transportation Needs: No Transportation Needs (10/27/2022)   PRAPARE - Administrator, Civil Service (Medical): No    Lack of Transportation (Non-Medical): No  Physical Activity: Inactive (09/26/2022)   Exercise Vital Sign    Days of Exercise per Week: 0 days    Minutes of Exercise per Session: 0 min  Stress: No Stress Concern Present (09/26/2022)   [employer] of Occupational Health - Occupational Stress Questionnaire    Feeling of Stress : Not at all  Social Connections: Socially Isolated (09/26/2022)   Social Connection and Isolation Panel    Frequency of Communication with Friends and Family: More than three times a week    Frequency of Social Gatherings with Friends and Family: Twice a week    Attends Religious Services: Never    Database administrator or Organizations: No    Attends Banker Meetings: Never    Marital Status: Divorced  Catering manager Violence: Not At Risk  (10/27/2022)   Humiliation, Afraid, Rape, and Kick questionnaire    Fear of Current or Ex-Partner: No    Emotionally Abused: No    Physically Abused: No    Sexually Abused: No    Allergies  Allergen Reactions   Ciprofloxacin Other (See Comments)    Aortic dissection   Heparin  Other (See Comments)    UNSPECIFIED REACTION :  On Coumadin  since 2004   HIT panel negative 01/19/17   Doxercalciferol  Other (See Comments)   Tomato    Quinolones Other (See Comments)    unknown    Current Facility-Administered Medications  Medication Dose Route Frequency Provider Last Rate Last Admin   acetaminophen  (TYLENOL ) tablet  650 mg  650 mg Oral Q6H PRN Smith, Rondell A, MD       Or   acetaminophen  (TYLENOL ) suppository 650 mg  650 mg Rectal Q6H PRN Claudene Maximino LABOR, MD       albumin  human 25 % solution 25 g  25 g Intravenous Q1H PRN Geralynn Charleston, MD       albuterol (PROVENTIL) (2.5 MG/3ML) 0.083% nebulizer solution 2.5 mg  2.5 mg Nebulization Q4H PRN Smith, Rondell A, MD       [START ON 09/29/2023] Chlorhexidine  Gluconate Cloth 2 % PADS 6 each  6 each Topical Q0600 Geralynn Charleston, MD       fentaNYL  (SUBLIMAZE ) injection 25 mcg  25 mcg Intravenous Q2H PRN Smith, Rondell A, MD       midodrine  (PROAMATINE ) tablet 15 mg  15 mg Oral TID WC Smith, Rondell A, MD       oxyCODONE -acetaminophen  (PERCOCET/ROXICET) 5-325 MG per tablet 1 tablet  1 tablet Oral Q4H PRN Smith, Rondell A, MD       sodium chloride  flush (NS) 0.9 % injection 3 mL  3 mL Intravenous Q12H Claudene Maximino LABOR, MD       Current Outpatient Medications  Medication Sig Dispense Refill   acetaminophen  (TYLENOL ) 325 MG tablet Take 2 tablets (650 mg total) by mouth 4 (four) times daily -  with meals and at bedtime.     amiodarone  (PACERONE ) 200 MG tablet Take 1 tablet (200 mg total) by mouth daily. 90 tablet 2   apixaban  (ELIQUIS ) 2.5 MG TABS tablet Take 1 tablet (2.5 mg total) by mouth 2 (two) times daily. 60 tablet 5   bacitracin  ointment  Apply topically 2 (two) times daily. 120 g 0   camphor-menthol  (SARNA) lotion Apply 1 Application topically every 8 (eight) hours as needed for itching.     cinacalcet  (SENSIPAR ) 30 MG tablet Take 1 tablet (30 mg total) by mouth daily with supper. 30 tablet 0   diclofenac  Sodium (VOLTAREN ) 1 % GEL Apply 2 g topically 4 (four) times daily as needed (Arthritis). 400 g 1   docusate sodium  (COLACE) 100 MG capsule Take 1 capsule (100 mg total) by mouth daily. 100 capsule 0   DULoxetine  (CYMBALTA ) 20 MG capsule Take 1 capsule (20 mg total) by mouth daily. 90 capsule 0   gabapentin  (NEURONTIN ) 300 MG capsule Take 1 capsule (300 mg total) by mouth at bedtime. 90 capsule 1   hydrocerin (EUCERIN) CREA Apply 1 Application topically daily.     lidocaine  (LIDODERM ) 5 % Place 3 patches onto the skin daily. Remove & Discard patch within 12 hours or as directed by MD 90 patch 0   melatonin 3 MG TABS tablet Take 1 tablet (3 mg total) by mouth at bedtime. 60 tablet 0   methocarbamol  (ROBAXIN ) 500 MG tablet Take 1 tablet (500 mg total) by mouth every 6 (six) hours as needed for muscle spasms. 40 tablet 0   midodrine  (PROAMATINE ) 5 MG tablet Take 3 tablets (15 mg total) by mouth 3 (three) times daily with meals. 810 tablet 2   Multiple Vitamins-Minerals (MULTIVITAMIN WITH MINERALS) tablet Take 1 tablet by mouth daily.     oxyCODONE -acetaminophen  (PERCOCET) 5-325 MG tablet Take 1 tablet by mouth every 6 (six) hours as needed for severe pain (pain score 7-10). (Patient not taking: Reported on 07/10/2023) 15 tablet 0   oxymetazoline  (AFRIN) 0.05 % nasal spray Place 1 spray into both nostrils 2 (two) times daily. 30 mL 0  polyethylene glycol powder (GLYCOLAX /MIRALAX ) 17 GM/SCOOP powder Take 1 capful (17 g) with water  by mouth 2 (two) times daily. 238 g 0   senna-docusate (SENOKOT-S) 8.6-50 MG tablet Take 1 tablet by mouth 2 (two) times daily. 60 tablet 0   sucroferric oxyhydroxide (VELPHORO ) 500 MG chewable tablet Chew 1  tablet (500 mg total) by mouth 3 (three) times daily with meals. 90 tablet 0   zolpidem  (AMBIEN ) 5 MG tablet Take 1 tablet (5 mg total) by mouth at bedtime as needed for sleep (Insomnia). (Patient not taking: Reported on 07/10/2023) 30 tablet 0    PHYSICAL EXAM Vitals:   09/28/23 1331 09/28/23 1348 09/28/23 1526 09/28/23 1527  BP:  91/63 (!) 112/49 (!) 112/49  Pulse:  90  86  Resp:  11 16 16   Temp:  98 F (36.7 C)    SpO2:  100%  92%  Weight: 64 kg      Chronically ill appearing much older than stated age. TDC in place Unlabored breathing Regular rate and rhythm L AKA No pedal pulses Hyperkeratosis and matted dirt about the right foot and ankle. Venous ulceration of the calf with slough  PERTINENT LABORATORY AND RADIOLOGIC DATA  Most recent CBC    Latest Ref Rng & Units 09/28/2023    2:02 PM 12/10/2022    6:54 AM 12/08/2022   12:08 PM  CBC  WBC 4.0 - 10.5 K/uL 7.3  7.6  8.2   Hemoglobin 13.0 - 17.0 g/dL 88.8  8.7  9.1   Hematocrit 39.0 - 52.0 % 38.7  31.5  31.9   Platelets 150 - 400 K/uL 127  213  203      Most recent CMP    Latest Ref Rng & Units 09/28/2023    2:02 PM 12/10/2022    6:54 AM 12/08/2022   12:08 PM  CMP  Glucose 70 - 99 mg/dL 95  89  87   BUN 6 - 20 mg/dL 67  42  53   Creatinine 0.61 - 1.24 mg/dL 89.63  3.98  3.68   Sodium 135 - 145 mmol/L 144  136  135   Potassium 3.5 - 5.1 mmol/L 4.6  4.8  4.5   Chloride 98 - 111 mmol/L 100  98  97   CO2 22 - 32 mmol/L 25  25  23    Calcium  8.9 - 10.3 mg/dL 9.0  9.2  9.4   Total Protein 6.5 - 8.1 g/dL 7.2     Total Bilirubin 0.0 - 1.2 mg/dL 1.0     Alkaline Phos 38 - 126 U/L 230     AST 15 - 41 U/L 14     ALT 0 - 44 U/L 9       Renal function Estimated Creatinine Clearance: 7.5 mL/min (A) (by C-G formula based on SCr of 10.36 mg/dL (H)).  No results found for: HGBA1C  LDL Cholesterol  Date Value Ref Range Status  05/20/2018 93 0 - 99 mg/dL Final    Comment:           Total Cholesterol/HDL:CHD Risk Coronary  Heart Disease Risk Table                     Men   Women  1/2 Average Risk   3.4   3.3  Average Risk       5.0   4.4  2 X Average Risk   9.6   7.1  3 X Average Risk  23.4  11.0        Use the calculated Patient Ratio above and the CHD Risk Table to determine the patient's CHD Risk.        ATP III CLASSIFICATION (LDL):  <100     mg/dL   Optimal  899-870  mg/dL   Near or Above                    Optimal  130-159  mg/dL   Borderline  839-810  mg/dL   High  >809     mg/dL   Very High Performed at [facility], [address], [place], [place] [address]      Debby SAILOR. Magda, MD [facility] Vascular and Vein Specialists of [facility] Phone Number: [phone] 09/28/2023 5:56 PM   Total time spent on preparing this encounter including chart review, data review, collecting history, examining the patient, and coordinating care: 60 minutes  Portions of this report may have been transcribed using voice recognition software.  Every effort has been made to ensure accuracy; however, inadvertent computerized transcription errors may still be present.

## 2023-09-28 NOTE — Consult Note (Signed)
 Renal Service Consult Note [facility]  Corey [name] 09/28/2023 Corey JONETTA Fret, MD Requesting Physician: Dr. FABIENE Sharps  Reason for Consult: ESRD patient with worsening wounds of the right leg HPI: The patient is a 53 y.o. year-old w/ PMH as below who presented to ED this afternoon reporting wounds on his right leg that are chronic and have been getting progressively worse over the past week.  Also feeling weak and tired.  Missed dialysis today because he was too weak to go.  In ED BP 104/67, HR 90, 100% on 4 L.  Potassium 4.6, creatinine 10.3.  Patient was admitted and started on IV antibiotics with Maxipime and vancomycin , also got a 500 cc normal saline bolus.  Patient was admitted.  We are asked to see for dialysis   Pt seen in ED room.  Patient is alert and pleasant, main issues are fatigue, right leg pain, some hot and cold flashes.  No shortness of breath.  Patient wears 4 L nasal cannula oxygen  at home chronically.   ROS - denies CP, no joint pain, no HA, no blurry vision, no rash, no diarrhea, no nausea/ vomiting   Past Medical History  Past Medical History:  Diagnosis Date   Arthritis    hands and shoulders   Blindness and low vision    Stargardt disease   Dissection of aorta (HCC) 2004   a. s/p extensive repeair in 2004 in [place] complicated by ESRD, lower extremity paralysis, coma, and extended hospitalization of 2 years   ESRD (end stage renal disease) (HCC)    a. TTS   Headache    History of cardioversion 2014   Hypertension    Neuropathy    Non-healing non-surgical wound 03/2016   PAF (paroxysmal atrial fibrillation) (HCC)    a. s/p DCCV in 2014; b. on Coumadin ; c. CHADS2VASc => 2 (HTN, vascular disease)   Paralysis (HCC)    due to dissection of aorta in 2004, lower extremities   Pneumonia    Past Surgical History  Past Surgical History:  Procedure Laterality Date   AMPUTATION Left 11/17/2022   Procedure: AMPUTATION ABOVE KNEE;  Surgeon:  Serene Gaile ORN, MD;  Location: [facility] OR;  Service: Vascular;  Laterality: Left;   APPLICATION OF WOUND VAC Left 04/13/2016   Procedure: APPLICATION OF WOUND VAC;  Surgeon: Redell LITTIE Door, MD;  Location: [facility] OR;  Service: Vascular;  Laterality: Left;   APPLICATION OF WOUND VAC Left 04/18/2016   Procedure: APPLICATION OF WOUND VAC;  Surgeon: Penne Lonni Colorado, MD;  Location: [facility] OR;  Service: Vascular;  Laterality: Left;  Wound vac change    APPLICATION OF WOUND VAC Left 04/20/2016   Procedure: WOUND VAC CHANGE;  Surgeon: Redell LITTIE Door, MD;  Location: [facility] OR;  Service: Vascular;  Laterality: Left;   AV FISTULA PLACEMENT     BASCILIC VEIN TRANSPOSITION Left 12/09/2015   Procedure: FIRST STAGE BASILIC VEIN TRANSPOSITION LEFT UPPER ARM;  Surgeon: Redell LITTIE Door, MD;  Location: [facility] OR;  Service: Vascular;  Laterality: Left;   BASCILIC VEIN TRANSPOSITION Left 03/09/2016   Procedure: SECOND STAGE BASILIC VEIN TRANSPOSITION WITH REVISION OF ANASTOMOSIS LEFT UPPER ARM;  Surgeon: Redell LITTIE Door, MD;  Location: [facility] OR;  Service: Vascular;  Laterality: Left;   CARDIOVERSION     CARDIOVERSION N/A 01/04/2019   Procedure: CARDIOVERSION;  Surgeon: Pietro Redell RAMAN, MD;  Location: [facility] ENDOSCOPY;  Service: Cardiovascular;  Laterality: N/A;   CHEST TUBE INSERTION Left 11/02/2022  Procedure: CHEST TUBE INSERTION;  Surgeon: Arlinda Ranks, MD;  Location: [facility] ENDOSCOPY;  Service: Pulmonary;  Laterality: Left;  Please have both Pigtail (COOK) and chest tube tray available   REPAIR OF ACUTE ASCENDING THORACIC AORTIC DISSECTION     REVISON OF ARTERIOVENOUS FISTULA Left 04/20/2016   Procedure: LIGATION OF BASILIC VEIN TRANSPOSITION;  Surgeon: Redell LITTIE Door, MD;  Location: [facility] OR;  Service: Vascular;  Laterality: Left;   WOUND DEBRIDEMENT Left 04/13/2016   Procedure: DEBRIDEMENT WOUND;  Surgeon: Redell LITTIE Door, MD;  Location: [facility] OR;  Service: Vascular;  Laterality: Left;   Family History  Family History  Problem Relation Age of Onset    Cancer Mother    Hypertension Mother    Cancer Father    Hypertension Father    Thyroid  disease Sister    Stroke Brother        54   Social History  reports that he has never smoked. He has never used smokeless tobacco. He reports that he does not drink alcohol and does not use drugs. Allergies  Allergies  Allergen Reactions   Ciprofloxacin Other (See Comments)    Aortic dissection   Heparin  Other (See Comments)    UNSPECIFIED REACTION :  On Coumadin  since 2004   HIT panel negative 01/19/17   Doxercalciferol  Other (See Comments)   Tomato    Quinolones Other (See Comments)    unknown   Home medications Prior to Admission medications   Medication Sig Start Date End Date Taking? Authorizing Provider  acetaminophen  (TYLENOL ) 325 MG tablet Take 2 tablets (650 mg total) by mouth 4 (four) times daily -  with meals and at bedtime. 12/01/22   Love, Sharlet RAMAN, PA-C  amiodarone  (PACERONE ) 200 MG tablet Take 1 tablet (200 mg total) by mouth daily. 01/20/23   Jeffrie Oneil BROCKS, MD  apixaban  (ELIQUIS ) 2.5 MG TABS tablet Take 1 tablet (2.5 mg total) by mouth 2 (two) times daily. 02/20/23   Jeffrie Oneil BROCKS, MD  bacitracin  ointment Apply topically 2 (two) times daily. 12/13/22   Love, Sharlet RAMAN, PA-C  camphor-menthol  [facility]) lotion Apply 1 Application topically every 8 (eight) hours as needed for itching. 11/29/22   Jillian Buttery, MD  cinacalcet  (SENSIPAR ) 30 MG tablet Take 1 tablet (30 mg total) by mouth daily with supper. 12/13/22   Love, Sharlet RAMAN, PA-C  diclofenac  Sodium (VOLTAREN ) 1 % GEL Apply 2 g topically 4 (four) times daily as needed (Arthritis). 06/07/23   Newlin, Enobong, MD  docusate sodium  (COLACE) 100 MG capsule Take 1 capsule (100 mg total) by mouth daily. 12/14/22   Love, Sharlet RAMAN, PA-C  DULoxetine  (CYMBALTA ) 20 MG capsule Take 1 capsule (20 mg total) by mouth daily. 06/07/23   Newlin, Enobong, MD  gabapentin  (NEURONTIN ) 300 MG capsule Take 1 capsule (300 mg total) by mouth at bedtime. 06/07/23    Newlin, Enobong, MD  hydrocerin (EUCERIN) CREA Apply 1 Application topically daily. 11/29/22   Jillian Buttery, MD  lidocaine  (LIDODERM ) 5 % Place 3 patches onto the skin daily. Remove & Discard patch within 12 hours or as directed by MD 12/13/22   Love, Sharlet RAMAN, PA-C  melatonin 3 MG TABS tablet Take 1 tablet (3 mg total) by mouth at bedtime. 12/13/22   Love, Sharlet RAMAN, PA-C  methocarbamol  (ROBAXIN ) 500 MG tablet Take 1 tablet (500 mg total) by mouth every 6 (six) hours as needed for muscle spasms. 12/13/22   Love, Sharlet RAMAN, PA-C  midodrine  (PROAMATINE ) 5 MG tablet Take  3 tablets (15 mg total) by mouth 3 (three) times daily with meals. 01/20/23   Jeffrie Oneil BROCKS, MD  Multiple Vitamins-Minerals (MULTIVITAMIN WITH MINERALS) tablet Take 1 tablet by mouth daily.    [provider]  oxyCODONE -acetaminophen  (PERCOCET) 5-325 MG tablet Take 1 tablet by mouth every 6 (six) hours as needed for severe pain (pain score 7-10). Patient not taking: Reported on 07/10/2023 03/07/23 03/06/24  Rhyne, Samantha J, PA-C  oxymetazoline  (AFRIN) 0.05 % nasal spray Place 1 spray into both nostrils 2 (two) times daily. 12/14/22   Love, Sharlet RAMAN, PA-C  polyethylene glycol powder (GLYCOLAX /MIRALAX ) 17 GM/SCOOP powder Take 1 capful (17 g) with water  by mouth 2 (two) times daily. 12/13/22   Love, Sharlet RAMAN, PA-C  senna-docusate (SENOKOT-S) 8.6-50 MG tablet Take 1 tablet by mouth 2 (two) times daily. 12/13/22   Love, Sharlet RAMAN, PA-C  sucroferric oxyhydroxide (VELPHORO ) 500 MG chewable tablet Chew 1 tablet (500 mg total) by mouth 3 (three) times daily with meals. 12/13/22   Love, Sharlet RAMAN, PA-C  zolpidem  (AMBIEN ) 5 MG tablet Take 1 tablet (5 mg total) by mouth at bedtime as needed for sleep (Insomnia). Patient not taking: Reported on 07/10/2023 12/13/22   Maurice Sharlet RAMAN, [place]     Vitals:   09/28/23 1331 09/28/23 1348 09/28/23 1526 09/28/23 1527  BP:  91/63 (!) 112/49 (!) 112/49  Pulse:  90  86  Resp:  11 16 16   Temp:  98 F (36.7 C)     SpO2:  100%  92%  Weight: 64 kg      Exam Gen alert, no distress, 4L [place] O2 No rash, cyanosis or gangrene Sclera anicteric, throat clear  No jvd or bruits Chest clear bilat to bases, no rales/ wheezing RRR no MRG Abd soft ntnd no mass or ascites +bs GU deferred MS L AKA Ext RLE w/ diffuse skin inflammation/ discoloration w/ some ulceration Neuro is alert, Ox 3 , nf    RIJ TDC clean exit site   CXR 6/26 - IMPRESSION: Cardiomegaly with diffuse increased interstitial and ground-glass opacity probably representing edema. Atelectasis and or scarring at the bases   OP HD: TTS [place] From sept 2024-> 4h  66kg TDC  No heparin  (allergic)  lock TDC w/ citrate    Assessment/ Plan: R lower extremity wounds: per pmd, starting IV abx vanc/ cefepime.  ESRD: on HD TTS. Due for HD today and looks wet on CXR. Plan HD for tonight.  BP: chronic hypotension on midodrine  15 tid. Cont while here. Volume: lungs clear, CXR + IS edema, not symptomatic. 4L [place] here as at home.  Anemia of esrd: Hb 11, follow      Myer Fret  MD [facility] 09/28/2023, 4:24 PM  Recent Labs  Lab 09/28/23 1402  HGB 11.1*  ALBUMIN  3.3*  CALCIUM  9.0  CREATININE 10.36*  K 4.6   Inpatient medications:   vancomycin  1,250 mg (09/28/23 1523)

## 2023-09-28 NOTE — Progress Notes (Signed)
 Orthopedic Tech Progress Note Patient Details:  CORDERIUS SARACENI 1970/12/22 [id number]  Ortho Devices Type of Ortho Device: Radio broadcast assistant Ortho Device/Splint Location: RLE Ortho Device/Splint Interventions: Ordered, Adjustment, Application   Post Interventions Patient Tolerated: Well Instructions Provided: Care of device  Lile Mccurley L Dessire Grimes 09/28/2023, 10:15 PM

## 2023-09-28 NOTE — Plan of Care (Signed)
  Problem: Education: Goal: Knowledge of General Education information will improve Description: Including pain rating scale, medication(s)/side effects and non-pharmacologic comfort measures Outcome: Progressing   Problem: Clinical Measurements: Goal: Ability to maintain clinical measurements within normal limits will improve Outcome: Progressing Goal: Diagnostic test results will improve Outcome: Progressing Goal: Respiratory complications will improve Outcome: Progressing Goal: Cardiovascular complication will be avoided Outcome: Progressing   Problem: Activity: Goal: Risk for activity intolerance will decrease Outcome: Progressing   Problem: Nutrition: Goal: Adequate nutrition will be maintained Outcome: Progressing   Problem: Coping: Goal: Level of anxiety will decrease Outcome: Progressing   Problem: Elimination: Goal: Will not experience complications related to bowel motility Outcome: Progressing   Problem: Pain Managment: Goal: General experience of comfort will improve and/or be controlled Outcome: Progressing   Problem: Safety: Goal: Ability to remain free from injury will improve Outcome: Progressing

## 2023-09-28 NOTE — Progress Notes (Signed)
 Pharmacy Antibiotic Note  Corey [name] is a 53 y.o. male for which pharmacy has been consulted for cefepime and vancomycin  dosing for wound infection.  38 yom with a history of ESRD on HD TTS, calciphylaxis, AF on eliquis , Type B aortic dissection, PAD, chronic LEE, blindness presenting with worsening leg wound.   Nephrology note states planning for HD tonight.  WBC 7.3; LA 3.2>3; T 98; HR 86; RR 16  Plan: Cefepime 2g given in the ED -- Will resume with 1g cefepime q24h in the evenings Vancomycin  1250 mg once given in the ED ---plan for 750 mg with HD Monitor WBC, fever, renal function, cultures De-escalate when able F/u Nephrology plan  Weight: 64 kg (141 lb 1.5 oz)  Temp (24hrs), Avg:98 F (36.7 C), Min:98 F (36.7 C), Max:98 F (36.7 C)  Recent Labs  Lab 09/28/23 1402 09/28/23 1407 09/28/23 1611  WBC 7.3  --   --   CREATININE 10.36*  --   --   LATICACIDVEN  --  3.2* 3.0*    Estimated Creatinine Clearance: 7.5 mL/min (A) (by C-G formula based on SCr of 10.36 mg/dL (H)).    Allergies  Allergen Reactions   Ciprofloxacin Other (See Comments)    Aortic dissection   Heparin  Other (See Comments)    UNSPECIFIED REACTION :  On Coumadin  since 2004   HIT panel negative 01/19/17   Doxercalciferol  Other (See Comments)   Tomato    Quinolones Other (See Comments)    unknown    Microbiology results: Pending  Thank you for allowing pharmacy to be a part of this patient's care.  Dorn Buttner, PharmD, BCPS 09/28/2023 4:40 PM ED Clinical Pharmacist -  [phone]

## 2023-09-28 NOTE — ED Notes (Signed)
 Patient transported to CT

## 2023-09-28 NOTE — Progress Notes (Addendum)
 PHARMACY - ANTICOAGULATION CONSULT NOTE  Pharmacy Consult for Argatroban  Indication: Holding eliquis  for possible surgical intervention  Allergies  Allergen Reactions   Ciprofloxacin Other (See Comments)    Aortic dissection   Heparin  Other (See Comments)    UNSPECIFIED REACTION :  On Coumadin  since 2004   HIT panel negative 01/19/17   Doxercalciferol  Other (See Comments)   Tomato    Quinolones Other (See Comments)    unknown    Patient Measurements: Weight: 64 kg (141 lb 1.5 oz)  Vital Signs: Temp: 98 F (36.7 C) (06/26 1348) BP: 112/49 (06/26 1527) Pulse Rate: 86 (06/26 1527)  Labs: Recent Labs    09/28/23 1402  HGB 11.1*  HCT 38.7*  PLT 127*  LABPROT 19.8*  INR 1.6*  CREATININE 10.36*    Estimated Creatinine Clearance: 7.5 mL/min (A) (by C-G formula based on SCr of 10.36 mg/dL (H)).   Medical History: Past Medical History:  Diagnosis Date   Arthritis    hands and shoulders   Blindness and low vision    Stargardt disease   Dissection of aorta (HCC) 2004   a. s/p extensive repeair in 2004 in [place] complicated by ESRD, lower extremity paralysis, coma, and extended hospitalization of 2 years   ESRD (end stage renal disease) (HCC)    a. TTS   Headache    History of cardioversion 2014   Hypertension    Neuropathy    Non-healing non-surgical wound 03/2016   PAF (paroxysmal atrial fibrillation) (HCC)    a. s/p DCCV in 2014; b. on Coumadin ; c. CHADS2VASc => 2 (HTN, vascular disease)   Paralysis (HCC)    due to dissection of aorta in 2004, lower extremities   Pneumonia     Assessment: 53 yom with a history of ESRD on HD, calciphylaxis, AF on eliquis , Type B aortic dissection, PAD, chronic LEE, blindness presenting with worsening leg wound. Agatroban per pharmacy consult placed for bridge therapy for apixaban  as may need surgical intervention and patient has questionable history of HIT.  Heparin  induced platelet Ab 01/19/17 - 0.355  Apixaban  last taken  6/26 am per patient.  Hgb 11.1; plt 127 PT/INR 19.8/1.6 AST/ALT 14/9  Goal of Therapy:  aPTT 50-90 seconds Monitor platelets by anticoagulation protocol: Yes   Plan:  Argatroban  1mcg/kg/min at 10pm today Check 4 hr PTT then daily Monitor CBC and for s/s of hemorrhage  Dorn Buttner, PharmD, BCPS 09/28/2023 5:06 PM ED Clinical Pharmacist -  [phone]

## 2023-09-28 NOTE — H&P (Signed)
 History and Physical    Patient: Corey [name] FMW:[id number] DOB: [birth year] DOA: 09/28/2023 DOS: the patient was seen and examined on 09/28/2023 PCP: Celestia Rosaline SQUIBB, NP  Patient coming from: Home with his brother who has access primary care provider  Chief Complaint:  Chief Complaint  Patient presents with   Wound Infection   HPI: Corey [name] is a 52 y.o. male with medical history significant of ESRD on HD, calciphylaxis, paroxysmal atrial fibrillation on Coumadin , type B aortic dissection, peripheral artery disease, chronic lower extremity edema, blindness,  and nonambulatory presents with a worsening right leg wound.  He has been experiencing a worsening wound on his right leg over the past couple of weeks, with a significant increase in severity noted last night. The wound is leaking and causing significant pain, and the dressing has dissolved into the flesh. He is currently followed by wound care and vascular surgery for his leg issues.  He is on oxygen  therapy at home, using four liters per minute. No increased shortness of breath. He undergoes dialysis on Tuesday, Thursday, and Saturday, but did not receive dialysis today.  He has a history of two tears in his main aorta artery, which led to surgery and a subsequent coma lasting two and a half months.  In the emergency department patient was noted to be afebrile with stable vital signs.  Labs significant for WBC 7.3, hemoglobin 11, platelets 127, potassium 4.6, BUN 67, creatinine 10.36,  and lactic acid 3.2.  X-rays of the right leg and footFor  X-rays of the right foot noted large dorsal defect to be due to wound, limited assessment of osseous structures due to osteopenia, and bony reabsorption/destructive changes involving the 1st through 4th digits concerning for osteomyelitis.  Chest x-ray notedCardiomegaly with diffuse increased interstitial and groundglass opacities thought to represent edema with atelectasis and or scarring  the bases.    Review of Systems: As mentioned in the history of present illness. All other systems reviewed and are negative. Past Medical History:  Diagnosis Date   Arthritis    hands and shoulders   Blindness and low vision    Stargardt disease   Dissection of aorta (HCC) 2004   a. s/p extensive repeair in 2004 in [place] complicated by ESRD, lower extremity paralysis, coma, and extended hospitalization of 2 years   ESRD (end stage renal disease) (HCC)    a. TTS   Headache    History of cardioversion 2014   Hypertension    Neuropathy    Non-healing non-surgical wound 03/2016   PAF (paroxysmal atrial fibrillation) (HCC)    a. s/p DCCV in 2014; b. on Coumadin ; c. CHADS2VASc => 2 (HTN, vascular disease)   Paralysis (HCC)    due to dissection of aorta in 2004, lower extremities   Pneumonia    Past Surgical History:  Procedure Laterality Date   AMPUTATION Left 11/17/2022   Procedure: AMPUTATION ABOVE KNEE;  Surgeon: Serene Gaile ORN, MD;  Location: [facility] OR;  Service: Vascular;  Laterality: Left;   APPLICATION OF WOUND VAC Left 04/13/2016   Procedure: APPLICATION OF WOUND VAC;  Surgeon: Redell LITTIE Door, MD;  Location: [facility] OR;  Service: Vascular;  Laterality: Left;   APPLICATION OF WOUND VAC Left 04/18/2016   Procedure: APPLICATION OF WOUND VAC;  Surgeon: Penne Lonni Colorado, MD;  Location: [facility] OR;  Service: Vascular;  Laterality: Left;  Wound vac change    APPLICATION OF WOUND VAC Left 04/20/2016   Procedure: WOUND VAC CHANGE;  Surgeon: Redell LITTIE Door, MD;  Location: [facility] OR;  Service: Vascular;  Laterality: Left;   AV FISTULA PLACEMENT     BASCILIC VEIN TRANSPOSITION Left 12/09/2015   Procedure: FIRST STAGE BASILIC VEIN TRANSPOSITION LEFT UPPER ARM;  Surgeon: Redell LITTIE Door, MD;  Location: [facility] OR;  Service: Vascular;  Laterality: Left;   BASCILIC VEIN TRANSPOSITION Left 03/09/2016   Procedure: SECOND STAGE BASILIC VEIN TRANSPOSITION WITH REVISION OF ANASTOMOSIS LEFT UPPER ARM;  Surgeon: Redell LITTIE Door, MD;  Location: [facility] OR;  Service: Vascular;  Laterality: Left;   CARDIOVERSION     CARDIOVERSION N/A 01/04/2019   Procedure: CARDIOVERSION;  Surgeon: Pietro Redell RAMAN, MD;  Location: [facility] ENDOSCOPY;  Service: Cardiovascular;  Laterality: N/A;   CHEST TUBE INSERTION Left 11/02/2022   Procedure: CHEST TUBE INSERTION;  Surgeon: Arlinda Ranks, MD;  Location: [facility] ENDOSCOPY;  Service: Pulmonary;  Laterality: Left;  Please have both Pigtail (COOK) and chest tube tray available   REPAIR OF ACUTE ASCENDING THORACIC AORTIC DISSECTION     REVISON OF ARTERIOVENOUS FISTULA Left 04/20/2016   Procedure: LIGATION OF BASILIC VEIN TRANSPOSITION;  Surgeon: Redell LITTIE Door, MD;  Location: [facility] OR;  Service: Vascular;  Laterality: Left;   WOUND DEBRIDEMENT Left 04/13/2016   Procedure: DEBRIDEMENT WOUND;  Surgeon: Redell LITTIE Door, MD;  Location: [facility] OR;  Service: Vascular;  Laterality: Left;   Social History:  reports that he has never smoked. He has never used smokeless tobacco. He reports that he does not drink alcohol and does not use drugs.  Allergies  Allergen Reactions   Ciprofloxacin Other (See Comments)    Aortic dissection   Heparin  Other (See Comments)    UNSPECIFIED REACTION :  On Coumadin  since 2004   HIT panel negative 01/19/17   Doxercalciferol  Other (See Comments)   Tomato    Quinolones Other (See Comments)    unknown    Family History  Problem Relation Age of Onset   Cancer Mother    Hypertension Mother    Cancer Father    Hypertension Father    Thyroid  disease Sister    Stroke Brother        11    Prior to Admission medications   Medication Sig Start Date End Date Taking? Authorizing Provider  acetaminophen  (TYLENOL ) 325 MG tablet Take 2 tablets (650 mg total) by mouth 4 (four) times daily -  with meals and at bedtime. 12/01/22   Love, Sharlet RAMAN, PA-C  amiodarone  (PACERONE ) 200 MG tablet Take 1 tablet (200 mg total) by mouth daily. 01/20/23   Jeffrie Oneil BROCKS, MD  apixaban  (ELIQUIS ) 2.5 MG  TABS tablet Take 1 tablet (2.5 mg total) by mouth 2 (two) times daily. 02/20/23   Jeffrie Oneil BROCKS, MD  bacitracin  ointment Apply topically 2 (two) times daily. 12/13/22   Love, Sharlet RAMAN, PA-C  camphor-menthol  [facility]) lotion Apply 1 Application topically every 8 (eight) hours as needed for itching. 11/29/22   Jillian Buttery, MD  cinacalcet  (SENSIPAR ) 30 MG tablet Take 1 tablet (30 mg total) by mouth daily with supper. 12/13/22   Love, Sharlet RAMAN, PA-C  diclofenac  Sodium (VOLTAREN ) 1 % GEL Apply 2 g topically 4 (four) times daily as needed (Arthritis). 06/07/23   Newlin, Enobong, MD  docusate sodium  (COLACE) 100 MG capsule Take 1 capsule (100 mg total) by mouth daily. 12/14/22   Love, Sharlet RAMAN, PA-C  DULoxetine  (CYMBALTA ) 20 MG capsule Take 1 capsule (20 mg total) by mouth daily. 06/07/23   Newlin, Enobong,  MD  gabapentin  (NEURONTIN ) 300 MG capsule Take 1 capsule (300 mg total) by mouth at bedtime. 06/07/23   Newlin, Enobong, MD  hydrocerin (EUCERIN) CREA Apply 1 Application topically daily. 11/29/22   Jillian Buttery, MD  lidocaine  (LIDODERM ) 5 % Place 3 patches onto the skin daily. Remove & Discard patch within 12 hours or as directed by MD 12/13/22   Love, Sharlet RAMAN, PA-C  melatonin 3 MG TABS tablet Take 1 tablet (3 mg total) by mouth at bedtime. 12/13/22   Love, Sharlet RAMAN, PA-C  methocarbamol  (ROBAXIN ) 500 MG tablet Take 1 tablet (500 mg total) by mouth every 6 (six) hours as needed for muscle spasms. 12/13/22   Love, Sharlet RAMAN, PA-C  midodrine  (PROAMATINE ) 5 MG tablet Take 3 tablets (15 mg total) by mouth 3 (three) times daily with meals. 01/20/23   Jeffrie Oneil BROCKS, MD  Multiple Vitamins-Minerals (MULTIVITAMIN WITH MINERALS) tablet Take 1 tablet by mouth daily.    [provider]  oxyCODONE -acetaminophen  (PERCOCET) 5-325 MG tablet Take 1 tablet by mouth every 6 (six) hours as needed for severe pain (pain score 7-10). Patient not taking: Reported on 07/10/2023 03/07/23 03/06/24  Rhyne, Samantha J, PA-C   oxymetazoline  (AFRIN) 0.05 % nasal spray Place 1 spray into both nostrils 2 (two) times daily. 12/14/22   Love, Sharlet RAMAN, PA-C  polyethylene glycol powder (GLYCOLAX /MIRALAX ) 17 GM/SCOOP powder Take 1 capful (17 g) with water  by mouth 2 (two) times daily. 12/13/22   Love, Sharlet RAMAN, PA-C  senna-docusate (SENOKOT-S) 8.6-50 MG tablet Take 1 tablet by mouth 2 (two) times daily. 12/13/22   Love, Sharlet RAMAN, PA-C  sucroferric oxyhydroxide (VELPHORO ) 500 MG chewable tablet Chew 1 tablet (500 mg total) by mouth 3 (three) times daily with meals. 12/13/22   Love, Sharlet RAMAN, PA-C  zolpidem  (AMBIEN ) 5 MG tablet Take 1 tablet (5 mg total) by mouth at bedtime as needed for sleep (Insomnia). Patient not taking: Reported on 07/10/2023 12/13/22   Maurice Sharlet RAMAN, PA-C    Physical Exam: Vitals:   09/28/23 1331 09/28/23 1348 09/28/23 1526  BP:  91/63 (!) 112/49  Pulse:  90   Resp:  11 16  Temp:  98 F (36.7 C)   SpO2:  100%   Weight: 64 kg      Constitutional: Chronically ill-appearing middle-age male currently in no acute distress Eyes: PERRL, lids and conjunctivae normal ENMT: Mucous membranes are moist.  Neck: normal, supple  Respiratory: Normal respiratory effort with decreased aeration currently on 4 L of nasal cannula oxygen . Cardiovascular: Regular rate and rhythm, no murmurs / rubs / gallops.  Significant swelling noted of the right lower extremity.  Unable to obtain pulses.  Right chest hemodialysis catheter present Abdomen: no tenderness, no masses palpated.   Musculoskeletal: no clubbing / cyanosis.  Left AKA. Skin: Blackened appearance of left foot with swelling and open wounds present.  Mass noted of the left subclavicular region.  Swelling and wound noted over inguinal region and buttock Neurologic: CN 2-12 grossly intact.  Strength 5/5 in all 4.  Psychiatric: Normal judgment and insight. Alert and oriented x 3. Normal mood.   Data Reviewed:  EKG revealed atrial flutter with 2-1 AV block at 92  bpm. Reviewed labs, imaging, and pertinent records as documented.  Assessment and Plan:  Suspected gangrene of the right leg Calciphylaxis Patient presents with worsening wound of the right lower extremity related to calciphylaxis.  Records note ABI with and without TBI from 10/2022 noted noncompressible right lower  extremity arteries.  Previously had gangrene of the left leg requiring amputation 10/2022.  X-rays of the right lower extremity if concern for possibility of osteomyelitis. - Admit to a telemetry bed - Follow-up blood cultures - Add on ESR and CRP - Continue empiric antibiotics of vancomycin  and cefepime  - Oxycodone /fentanyl  as needed for moderate to severe pain. - Vascular surgery consulted, we will follow-up for any further recommendations  Cellulitis  Acute.  Patient noted to have wound and swelling of his inguinal and buttock.  Questioned possibility of underlying abscess. - Check CT scan of the abdomen pelvis - Antibiotics as noted above  Lactic acidosis Lactic acid elevated at 3.2 on admission.  Patient had initially been bolused 500 mL of normal saline IV fluids.  IV fluid boluses withheld due  to ESRD. - Continue to trend lactic acid levels  ESRD on HD Patient on a Tuesday, Thursday, Saturday schedule is due for dialysis today.  Labs noted potassium 4.6, CO2 25, BUN 67, creatinine 10.36, and anion gap 19.  Chest x-ray noted cardiomegaly with diffuse interstitial opacities concerning for edema atelectasis and or scarring at the bases. - Nephrology consulted for likely need of dialysis during hospitalization  Chronic respiratory failure with hypoxia Patient is on 4 L of nasal cannula oxygen  at baseline.  O2 saturations currently maintained on home 4 L of oxygen . - Continue nasal cannula oxygen  to maintain O2 saturation greater than 92%.   - Breathing treatments as needed.  Chronic hypotension On admission blood pressures noted to be soft 91/63 to 112/49. - Continue  midodrine   Atrial fibrillation/flutter on chronic anticoagulation Patient seen to be in atrial flutter with 2-1 AV block at 92 bpm.  Heparin  is on patient's allergy list although HIT panel appear to be negative.  - Continue amiodarone  - Agatroban per pharmacy after discussing allergy warning  Type B thoracic aortic dissection with aneurysmal dilatation of the proximal descending thoracic aorta  Status post left AKA  Debility Legally blind Patient is legally blind and nonambulatory.  Underweight BMI 17.64 kg/m  DVT prophylaxis: argatroban   Advance Care Planning:   Code Status: Full Code    Consults: Vascular surgery, nephrology  Family Communication: Patient's brother updated at bedside  Severity of Illness: The appropriate patient status for this patient is INPATIENT. Inpatient status is judged to be reasonable and necessary in order to provide the required intensity of service to ensure the patient's safety. The patient's presenting symptoms, physical exam findings, and initial radiographic and laboratory data in the context of their chronic comorbidities is felt to place them at high risk for further clinical deterioration. Furthermore, it is not anticipated that the patient will be medically stable for discharge from the hospital within 2 midnights of admission.   * I certify that at the point of admission it is my clinical judgment that the patient will require inpatient hospital care spanning beyond 2 midnights from the point of admission due to high intensity of service, high risk for further deterioration and high frequency of surveillance required.*  Author: Maximino DELENA Sharps, MD 09/28/2023 3:26 PM  For on call review [email or web address].

## 2023-09-28 NOTE — Sepsis Progress Note (Signed)
 Elink will follow per sepsis protocol.

## 2023-09-29 DIAGNOSIS — A419 Sepsis, unspecified organism: Secondary | ICD-10-CM | POA: Diagnosis not present

## 2023-09-29 DIAGNOSIS — R6520 Severe sepsis without septic shock: Secondary | ICD-10-CM | POA: Diagnosis not present

## 2023-09-29 LAB — PROTIME-INR
INR: 4.2 (ref 0.8–1.2)
Prothrombin Time: 41.9 s — ABNORMAL HIGH (ref 11.4–15.2)

## 2023-09-29 LAB — CBC
HCT: 42 % (ref 39.0–52.0)
Hemoglobin: 11.7 g/dL — ABNORMAL LOW (ref 13.0–17.0)
MCH: 27.5 pg (ref 26.0–34.0)
MCHC: 27.9 g/dL — ABNORMAL LOW (ref 30.0–36.0)
MCV: 98.6 fL (ref 80.0–100.0)
Platelets: 105 10*3/uL — ABNORMAL LOW (ref 150–400)
RBC: 4.26 MIL/uL (ref 4.22–5.81)
RDW: 17.2 % — ABNORMAL HIGH (ref 11.5–15.5)
WBC: 8.8 10*3/uL (ref 4.0–10.5)
nRBC: 0 % (ref 0.0–0.2)

## 2023-09-29 LAB — RENAL FUNCTION PANEL
Albumin: 3 g/dL — ABNORMAL LOW (ref 3.5–5.0)
Anion gap: 25 — ABNORMAL HIGH (ref 5–15)
BUN: 70 mg/dL — ABNORMAL HIGH (ref 6–20)
CO2: 17 mmol/L — ABNORMAL LOW (ref 22–32)
Calcium: 9.1 mg/dL (ref 8.9–10.3)
Chloride: 101 mmol/L (ref 98–111)
Creatinine, Ser: 10.17 mg/dL — ABNORMAL HIGH (ref 0.61–1.24)
GFR, Estimated: 6 mL/min — ABNORMAL LOW (ref >60)
Glucose, Bld: 83 mg/dL (ref 70–99)
Phosphorus: 9.7 mg/dL — ABNORMAL HIGH (ref 2.5–4.6)
Potassium: 5.3 mmol/L — ABNORMAL HIGH (ref 3.5–5.1)
Sodium: 143 mmol/L (ref 135–145)

## 2023-09-29 LAB — APTT
aPTT: 116 s — ABNORMAL HIGH (ref 24–36)
aPTT: 66 s — ABNORMAL HIGH (ref 24–36)

## 2023-09-29 LAB — GLUCOSE, CAPILLARY: Glucose-Capillary: 90 mg/dL (ref 70–99)

## 2023-09-29 MED ORDER — NEPRO/CARBSTEADY PO LIQD: 237.0000 mL | ORAL | Status: DC | PRN

## 2023-09-29 MED ORDER — ALBUMIN HUMAN 25 % IV SOLN
INTRAVENOUS | Status: AC
  Filled 2023-09-29: qty 100

## 2023-09-29 MED ORDER — LIDOCAINE HCL (PF) 1 % IJ SOLN: 5.0000 mL | INTRAMUSCULAR | Status: DC | PRN

## 2023-09-29 MED ORDER — PENTAFLUOROPROP-TETRAFLUOROETH EX AERO: 1.0000 | INHALATION_SPRAY | CUTANEOUS | Status: DC | PRN

## 2023-09-29 MED ORDER — CHLORHEXIDINE GLUCONATE CLOTH 2 % EX PADS
6.0000 | MEDICATED_PAD | Freq: Every day | CUTANEOUS | Status: DC
  Administered 2023-09-30 – 2023-10-01 (×2): 6 via TOPICAL

## 2023-09-29 MED ORDER — APIXABAN 2.5 MG PO TABS
2.5000 mg | ORAL_TABLET | Freq: Two times a day (BID) | ORAL | Status: DC
  Administered 2023-09-29 – 2023-10-03 (×9): 2.5 mg via ORAL
  Filled 2023-09-29 (×9): qty 1

## 2023-09-29 MED ORDER — FENTANYL CITRATE PF 50 MCG/ML IJ SOSY
PREFILLED_SYRINGE | INTRAMUSCULAR | Status: AC
  Filled 2023-09-29: qty 1

## 2023-09-29 MED ORDER — LIDOCAINE-PRILOCAINE 2.5-2.5 % EX CREA: 1.0000 | TOPICAL_CREAM | CUTANEOUS | Status: DC | PRN

## 2023-09-29 MED ORDER — VANCOMYCIN HCL 750 MG/150ML IV SOLN: 750.0000 mg | INTRAVENOUS | Status: DC

## 2023-09-29 MED ORDER — VANCOMYCIN HCL 750 MG/150ML IV SOLN
750.0000 mg | Freq: Once | INTRAVENOUS | Status: AC
  Administered 2023-09-29: 750 mg via INTRAVENOUS
  Filled 2023-09-29: qty 150

## 2023-09-29 MED ORDER — ANTICOAGULANT SODIUM CITRATE 4% (200MG/5ML) IV SOLN
5.0000 mL | Status: DC | PRN
  Administered 2023-09-29: 3800 mL
  Filled 2023-09-29: qty 5

## 2023-09-29 MED ORDER — ALTEPLASE 2 MG IJ SOLR: 2.0000 mg | Freq: Once | INTRAMUSCULAR | Status: DC | PRN

## 2023-09-29 NOTE — Plan of Care (Signed)

## 2023-09-29 NOTE — Progress Notes (Signed)
   09/29/23 1250  Vitals  Pulse Rate 67  Resp 20  BP 106/67  SpO2 93 %  O2 Device Nasal Cannula  Oxygen  Therapy  O2 Flow Rate (L/min) 4 L/min  Post Treatment  Dialyzer Clearance Clear  Liters Processed 84  Fluid Removed (mL) 2500 mL  Tolerated HD Treatment Yes   Received patient in bed to unit.  Alert and oriented.  Informed consent signed and in chart.    TX duration: 3.5hrs   Patient tolerated well.  Transported back to the room  Alert, without acute distress.  Hand-off given to patient's nurse.    Access used: CVC  Access issues: none   Total UF removed: 2.5L Medication(s) given:see eMAR  Woodfin LITTIE Dolores, RN  [facility]

## 2023-09-29 NOTE — Progress Notes (Signed)
 PHARMACY - ANTICOAGULATION  Pharmacy Consult for Argatroban  Indication: atrial fibrillation Brief A/P: aPTT supratherapeutic Decrease argatroban  rate   Allergies  Allergen Reactions   Ciprofloxacin Other (See Comments)    Aortic dissection   Heparin  Other (See Comments)    UNSPECIFIED REACTION :  On Coumadin  since 2004   HIT panel negative 01/19/17   Doxercalciferol  Other (See Comments)   Tomato    Quinolones Other (See Comments)    unknown    Patient Measurements: Weight: 64 kg (141 lb 1.5 oz)  Vital Signs: Temp: 97.6 F (36.4 C) (06/27 0416) BP: 100/62 (06/27 0416) Pulse Rate: 74 (06/27 0416)  Labs: Recent Labs    09/28/23 1402 09/28/23 1957 09/29/23 0349  HGB 11.1*  --  11.7*  HCT 38.7*  --  42.0  PLT 127*  --  105*  APTT  --  40* 116*  LABPROT 19.8*  --  41.9*  INR 1.6*  --  4.2*  CREATININE 10.36*  --  10.17*    Estimated Creatinine Clearance: 7.6 mL/min (A) (by C-G formula based on SCr of 10.17 mg/dL (H)).  Assessment: 53 y.o. male with h/o Afib and Elquis on hold, possible h/o HIT, for argatroban   Goal of Therapy:  aPTT 50-90 seconds Monitor platelets by anticoagulation protocol: Yes   Plan:  Decrease argatroban  0.6 mcg/kg/min  Check 4 hr PTT  Cathlyn Arrant, PharmD, BCPS

## 2023-09-29 NOTE — Progress Notes (Signed)
 PROGRESS NOTE    Corey [name]  FMW:[id number] DOB: Sep 15, 1970 DOA: 09/28/2023 PCP: Bari Morgans, NP   Brief Narrative: 53 year old with Past medical history significant for ESRD on hemodialysis, calciphylaxis, paroxysmal A-fib on midodrine , type IB aortic dissection, peripheral artery disease, chronic lower extremity edema, blindness, nonambulatory, on chronic 4 L of oxygen , presented with worsening right leg wound over the last couple of weeks.  The wound has been leaking and Significant Pain.  Evaluation in the ED he was noted to be afebrile, lactic acid 3.2, x-ray right leg:  large dorsal defect to be due to wound. , limited assessment of osseous structures due to osteopenia, and bony reabsorption/destructive changes involving the 1st through 4th digits concerning for osteomyelitis.   Patient admitted for further care lower extremity:   Assessment & Plan:   Principal Problem:   Leg wound, right Active Problems:   Calciphylaxis   Gangrene (HCC)   Cellulitis   Lactic acidosis   End-stage renal disease on hemodialysis (HCC)   Chronic respiratory failure with hypoxia (HCC)   Long term (current) use of anticoagulants [Z79.01]   Atrial fibrillation, chronic (HCC)   Dissection of thoracic aorta (HCC)   S/P AKA (above knee amputation), left (HCC)   Debility   Legally blind   Underweight  1-Gangrene of the right leg Calciphylaxis -Patient presented with worsening wound of the right lower extremity related to calciphylaxis - X-ray right lower extremity concern for possible osteomyelitis - Vascular has been consulted - Continue IV vancomycin  and cefepime  -CRP 2.1, ESR 13. - Patient was evaluated by vascular Dr. Magda, who recommended trial of Unna boot therapy to induce healing of the venous ulcer, if unsuccessful after several weeks, he should consider above-knee amputation. He is not a candidate for revascularization.  -ID consulted for guidance of antibiotics.   Cellulitis,  Inguinal and Buttocks area:  -CT Pelvis: Skin thickening in the inferior medial left buttocks extending to the level of the anus compatible with cellulitis. No enhancing fluid collection or soft tissue gas. Nonspecific subcutaneous edema in the inferior mid buttocks bilaterally without fluid collection. Having pain. CT scan negative for abscess.  Continue with IV antibiotics.   Lactic acidosis: -In setting infection Continue with IV antibiotics.   Thrombocytopenia:  In setting of infection. Monitor.   ESRD on hemodialysis Hyperkalemia - Nephrology  consulted. -See in HD today.   Chronic hypoxic respiratory failure On 4 L of oxygen  at home Chest x-ray noted cardiomegaly with diffuse interstitial opacity concerning for edema atelectasis or scaring at the bases. Stable. Volume manage with HD  Chronic hypotension: Continue with midodrine   A-fib a flutter on chronic anticoagulation - Continue amiodarone  - Heparin  on allergy list - Argatroban  per pharmacy  Type B thoracic aortic dissection with aneurysmal dilation of the proximal descending thoracic aorta  S/P Left AKA  Debility, legally blind  Underweight   Estimated body mass index is 17.64 kg/m as calculated from the following:   Height as of 07/10/23: 6' 3 (1.905 m).   Weight as of this encounter: 64 kg.   DVT prophylaxis: Argatroban  Code Status: Full code Family Communication: care discussed with patient.  Disposition Plan:  Status is: Inpatient Remains inpatient appropriate because: management of Left LE wound    Consultants:  Vascular Nephrology   Procedures:    Antimicrobials:    Subjective: Seen in hd, he is complaining  of pain buttock area. Denies worsening dyspnea.   Objective: Vitals:   09/28/23 1931 09/28/23 2008 09/29/23 0059 09/29/23  0416  BP: (!) 87/58 97/61 100/69 100/62  Pulse: 86 (!) 59 66 74  Resp: 14 17 17 17   Temp: 98.4 F (36.9 C) 98.2 F (36.8 C) 97.6 F (36.4 C) 97.6 F  (36.4 C)  SpO2: 93% 95% 95% 95%  Weight:        Intake/Output Summary (Last 24 hours) at 09/29/2023 0655 Last data filed at 09/29/2023 0300 Gross per 24 hour  Intake 254.42 ml  Output --  Net 254.42 ml   Filed Weights   09/28/23 1331  Weight: 64 kg    Examination:  General exam: Appears calm and comfortable , chronic ill appearing.  Respiratory system: Clear to auscultation. Respiratory effort normal. Cardiovascular system: S1 & S2 heard, RRR. Gastrointestinal system: Abdomen is nondistended, soft and nontender. No organomegaly or masses felt. Normal bowel sounds heard. Central nervous system: Alert and oriented. Extremities: left AKA. Right leg with unna boot.    Data Reviewed: I have personally reviewed following labs and imaging studies  CBC: Recent Labs  Lab 09/28/23 1402 09/29/23 0349  WBC 7.3 8.8  NEUTROABS 6.1  --   HGB 11.1* 11.7*  HCT 38.7* 42.0  MCV 96.8 98.6  PLT 127* 105*   Basic Metabolic Panel: Recent Labs  Lab 09/28/23 1402 09/29/23 0349  NA 144 143  K 4.6 5.3*  CL 100 101  CO2 25 17*  GLUCOSE 95 83  BUN 67* 70*  CREATININE 10.36* 10.17*  CALCIUM  9.0 9.1  PHOS  --  9.7*   GFR: Estimated Creatinine Clearance: 7.6 mL/min (A) (by C-G formula based on SCr of 10.17 mg/dL (H)). Liver Function Tests: Recent Labs  Lab 09/28/23 1402 09/29/23 0349  AST 14*  --   ALT 9  --   ALKPHOS 230*  --   BILITOT 1.0  --   PROT 7.2  --   ALBUMIN  3.3* 3.0*   No results for input(s): LIPASE, AMYLASE in the last 168 hours. No results for input(s): AMMONIA in the last 168 hours. Coagulation Profile: Recent Labs  Lab 09/28/23 1402 09/29/23 0349  INR 1.6* 4.2*   Cardiac Enzymes: No results for input(s): CKTOTAL, CKMB, CKMBINDEX, TROPONINI in the last 168 hours. BNP (last 3 results) No results for input(s): PROBNP in the last 8760 hours. HbA1C: No results for input(s): HGBA1C in the last 72 hours. CBG: No results for input(s):  GLUCAP in the last 168 hours. Lipid Profile: No results for input(s): CHOL, HDL, LDLCALC, TRIG, CHOLHDL, LDLDIRECT in the last 72 hours. Thyroid  Function Tests: No results for input(s): TSH, T4TOTAL, FREET4, T3FREE, THYROIDAB in the last 72 hours. Anemia Panel: No results for input(s): VITAMINB12, FOLATE, FERRITIN, TIBC, IRON, RETICCTPCT in the last 72 hours. Sepsis Labs: Recent Labs  Lab 09/28/23 1407 09/28/23 1611  LATICACIDVEN 3.2* 3.0*    No results found for this or any previous visit (from the past 240 hours).       Radiology Studies: CT PELVIS W CONTRAST Result Date: 09/28/2023 CLINICAL DATA:  Soft tissue infection suspected. EXAM: CT PELVIS WITH CONTRAST TECHNIQUE: Multidetector CT imaging of the pelvis was performed using the standard protocol following the bolus administration of intravenous contrast. RADIATION DOSE REDUCTION: This exam was performed according to the departmental dose-optimization program which includes automated exposure control, adjustment of the mA and/or kV according to patient size and/or use of iterative reconstruction technique. CONTRAST:  75mL OMNIPAQUE  IOHEXOL  350 MG/ML SOLN COMPARISON:  CT chest abdomen and pelvis 10/27/2022. FINDINGS: Urinary Tract: Bladder is completely decompressed  and not well evaluated. Bowel: Unremarkable visualized pelvic bowel loops. Appendix is within normal limits. Vascular/Lymphatic: There are extensive peripheral vascular calcifications. Aortic calcifications and atherosclerotic plaque are also seen. Info IVC filter is partially visualized. No enlarged lymph nodes are identified. Reproductive:  Prostate gland within normal limits. Other: There is a small amount of free fluid in the pelvis. There is a fat containing umbilical hernia, small in size. Musculoskeletal: There is skin thickening in the inferior medial left buttocks extending to the level of the anus. No enhancing fluid collection or  soft tissue gas. There is also some subcutaneous edema in the inferior mid buttocks bilaterally without fluid collection. Diffuse osseous sclerosis is again seen likely sequelae from chronic renal osteodystrophy. Degenerative changes affect the spine. IMPRESSION: 1. Skin thickening in the inferior medial left buttocks extending to the level of the anus compatible with cellulitis. No enhancing fluid collection or soft tissue gas. 2. Nonspecific subcutaneous edema in the inferior mid buttocks bilaterally without fluid collection. 3. Small amount of free fluid in the pelvis. 4. Aortic atherosclerosis. Aortic Atherosclerosis (ICD10-I70.0). Electronically Signed   By: Greig Pique M.D.   On: 09/28/2023 16:39   DG Tibia/Fibula Right Result Date: 09/28/2023 CLINICAL DATA:  Possible sepsis wound right leg EXAM: RIGHT TIBIA AND FIBULA - 2 VIEW COMPARISON:  None Available. FINDINGS: Advanced vascular calcifications. Diffuse osteopenia limits assessment of osseous structures. Large wound or ulcer along the medial and posterior aspect of the mid to lower leg. Nonspecific mild periosteal reaction at the shafts of the distal tibia and fibula. IMPRESSION: Large wound or ulcer along the medial and posterior aspect of the mid to lower leg. Diffuse osteopenia limits assessment of osseous structures. Nonspecific mild periosteal reaction at the shafts of the distal tibia and fibula. MRI may be obtained if suspicion for osseous infection Electronically Signed   By: Luke Bun M.D.   On: 09/28/2023 15:03   DG Foot Complete Right Result Date: 09/28/2023 CLINICAL DATA:  Wound EXAM: RIGHT FOOT COMPLETE - 3+ VIEW COMPARISON:  None Available. FINDINGS: Extensive vascular calcification. Marked osteopenia limits the exam. Large dorsal defect/deformity at the distal foot presumably due to wound or ulcer. Probable ulceration at the tip of the first distal phalanx. Suspicion of bony resorptive changes involving the first distal phalanx,  second distal and middle phalanx, third distal and middle phalanx as well as distal portion of third proximal phalanx, and distal phalanx of fourth digit. No definite soft tissue gas. IMPRESSION: Large dorsal defect/deformity at the distal foot presumably due to wound or ulcer. Probable ulceration at the tip of the first distal phalanx. Limited assessment of osseous structures due to marked osteopenia. Bony resorptive/destructive changes involving the first through fourth digits as described above concerning for osteomyelitis. Electronically Signed   By: Luke Bun M.D.   On: 09/28/2023 15:01   DG Chest Port 1 View Result Date: 09/28/2023 CLINICAL DATA:  Possible sepsis EXAM: PORTABLE CHEST 1 VIEW COMPARISON:  12/11/2022, 10/27/2022 FINDINGS: Right-sided central venous catheter tip at the right atrial IVC junction. Cardiomegaly with aortic atherosclerosis. Diffuse increased interstitial and ground-glass opacity probably represents edema. Linear atelectasis or scarring in the lower lungs. No pleural effusion or pneumothorax. IMPRESSION: Cardiomegaly with diffuse increased interstitial and ground-glass opacity probably representing edema. Atelectasis and or scarring at the bases Electronically Signed   By: Luke Bun M.D.   On: 09/28/2023 14:54        Scheduled Meds:  Chlorhexidine  Gluconate Cloth  6 each Topical Q0600  midodrine   15 mg Oral TID WC   sodium chloride  flush  3 mL Intravenous Q12H   Continuous Infusions:  albumin  human     anticoagulant sodium citrate      argatroban  1 mcg/kg/min (09/28/23 2314)   ceFEPime  (MAXIPIME ) IV     [START ON 09/30/2023] vancomycin        LOS: 1 day    Time spent: 35 minutes    Jowan Skillin A Lewi Drost, MD [facility]   If 7PM-7AM, please contact night-coverage [email or web address]  09/29/2023, 6:55 AM

## 2023-09-29 NOTE — Progress Notes (Signed)
 [facility] Progress Note  Subjective:  Seen in HD unit Lethargic but interacting BP's high 80s in HD   Vitals:   09/29/23 0930 09/29/23 1000 09/29/23 1030 09/29/23 1100  BP: 111/64 111/64 100/68 101/76  Pulse: 69 71 72 73  Resp: 14 18 16 19   Temp:      TempSrc:      SpO2: (!) 82% 97% 95% 95%  Weight:        Exam: Gen alert, no distress, 4L [place] O2 No jvd or bruits Chest clear bilat to bases RRR no MRG Abd soft ntnd no mass or ascites +bs Ext L AKA, RLE w/ diffuse skin inflammation/ discoloration Neuro is alert, Ox 3 , nf    RIJ TDC intact    CXR 6/26 - IMPRESSION: Cardiomegaly with diffuse increased interstitial and ground-glass opacity probably representing edema. Atelectasis and or scarring at the bases    OP HD: TTS [place] From sept 2024-> 4h  66kg TDC  No heparin  (allergic)  lock TDC w/ citrate       Assessment/ Plan: R lower extremity wounds: per pmd, starting IV abx vanc/ cefepime .  ESRD: on HD TTS. HD today off schedule (r/o from yesterday). Next HD Sat vs Monday.   BP/ chronic hypotension: on midodrine  15 tid. Usual BP are in the upper 80s at OP unit.  Volume: lungs clear, CXR + IS edema, not symptomatic. 4L [place] here as at home. Trying to get volume off as BP's will tolerate.  Anemia of esrd: Hb 11, follow      Myer Fret MD  [facility] 09/29/2023, 11:16 AM  Recent Labs  Lab 09/28/23 1402 09/29/23 0349  HGB 11.1* 11.7*  ALBUMIN  3.3* 3.0*  CALCIUM  9.0 9.1  PHOS  --  9.7*  CREATININE 10.36* 10.17*  K 4.6 5.3*   No results for input(s): IRON, TIBC, FERRITIN in the last 168 hours. Inpatient medications:  Chlorhexidine  Gluconate Cloth  6 each Topical Q0600   midodrine   15 mg Oral TID WC   sodium chloride  flush  3 mL Intravenous Q12H    anticoagulant sodium citrate      argatroban  0.6 mcg/kg/min (09/29/23 0742)   ceFEPime  (MAXIPIME ) IV     [START ON 09/30/2023] vancomycin      acetaminophen  **OR** acetaminophen , albuterol , alteplase ,  anticoagulant sodium citrate , feeding supplement (NEPRO CARB STEADY), fentaNYL  (SUBLIMAZE ) injection, lidocaine  (PF), lidocaine -prilocaine , oxyCODONE -acetaminophen , pentafluoroprop-tetrafluoroeth

## 2023-09-30 DIAGNOSIS — R6520 Severe sepsis without septic shock: Secondary | ICD-10-CM | POA: Diagnosis not present

## 2023-09-30 DIAGNOSIS — A419 Sepsis, unspecified organism: Secondary | ICD-10-CM | POA: Diagnosis not present

## 2023-09-30 DIAGNOSIS — I739 Peripheral vascular disease, unspecified: Secondary | ICD-10-CM

## 2023-09-30 DIAGNOSIS — M869 Osteomyelitis, unspecified: Secondary | ICD-10-CM

## 2023-09-30 DIAGNOSIS — L97909 Non-pressure chronic ulcer of unspecified part of unspecified lower leg with unspecified severity: Secondary | ICD-10-CM | POA: Diagnosis not present

## 2023-09-30 DIAGNOSIS — I83008 Varicose veins of unspecified lower extremity with ulcer other part of lower leg: Secondary | ICD-10-CM

## 2023-09-30 DIAGNOSIS — L03315 Cellulitis of perineum: Secondary | ICD-10-CM

## 2023-09-30 DIAGNOSIS — L03317 Cellulitis of buttock: Secondary | ICD-10-CM | POA: Diagnosis not present

## 2023-09-30 LAB — RENAL FUNCTION PANEL
Albumin: 3.2 g/dL — ABNORMAL LOW (ref 3.5–5.0)
Anion gap: 19 — ABNORMAL HIGH (ref 5–15)
BUN: 43 mg/dL — ABNORMAL HIGH (ref 6–20)
CO2: 23 mmol/L (ref 22–32)
Calcium: 9.5 mg/dL (ref 8.9–10.3)
Chloride: 96 mmol/L — ABNORMAL LOW (ref 98–111)
Creatinine, Ser: 6.87 mg/dL — ABNORMAL HIGH (ref 0.61–1.24)
GFR, Estimated: 9 mL/min — ABNORMAL LOW (ref >60)
Glucose, Bld: 65 mg/dL — ABNORMAL LOW (ref 70–99)
Phosphorus: 8.3 mg/dL — ABNORMAL HIGH (ref 2.5–4.6)
Potassium: 4.6 mmol/L (ref 3.5–5.1)
Sodium: 138 mmol/L (ref 135–145)

## 2023-09-30 LAB — GLUCOSE, CAPILLARY
Glucose-Capillary: 54 mg/dL — ABNORMAL LOW (ref 70–99)
Glucose-Capillary: 71 mg/dL (ref 70–99)
Glucose-Capillary: 61 mg/dL — ABNORMAL LOW (ref 70–99)
Glucose-Capillary: 75 mg/dL (ref 70–99)

## 2023-09-30 LAB — CORTISOL-AM, BLOOD: Cortisol - AM: 25.5 ug/dL — ABNORMAL HIGH (ref 6.7–22.6)

## 2023-09-30 LAB — HEPATITIS B SURFACE ANTIBODY, QUANTITATIVE: Hep B S AB Quant (Post): 36.2 m[IU]/mL

## 2023-09-30 MED ORDER — DEXTROSE 10 % IV SOLN: INTRAVENOUS | Status: DC

## 2023-09-30 MED ORDER — DEXTROSE 50 % IV SOLN
INTRAVENOUS | Status: AC
  Administered 2023-09-30: 50 mL
  Filled 2023-09-30: qty 50

## 2023-09-30 MED ORDER — DEXTROSE 50 % IV SOLN: 25.0000 mL | Freq: Once | INTRAVENOUS | Status: AC

## 2023-09-30 MED ORDER — BOOST / RESOURCE BREEZE PO LIQD CUSTOM
1.0000 | Freq: Three times a day (TID) | ORAL | Status: DC
  Administered 2023-09-30 – 2023-10-03 (×2): 1 via ORAL

## 2023-09-30 MED ORDER — SEVELAMER CARBONATE 800 MG PO TABS
3200.0000 mg | ORAL_TABLET | Freq: Three times a day (TID) | ORAL | Status: DC
  Administered 2023-09-30: 3200 mg via ORAL
  Filled 2023-09-30 (×6): qty 4

## 2023-09-30 MED ORDER — RENA-VITE PO TABS: 1.0000 | ORAL_TABLET | Freq: Every day | ORAL | Status: DC

## 2023-09-30 MED ORDER — METRONIDAZOLE 500 MG PO TABS
500.0000 mg | ORAL_TABLET | Freq: Two times a day (BID) | ORAL | Status: DC
  Administered 2023-09-30: 500 mg via ORAL
  Filled 2023-09-30: qty 1

## 2023-09-30 NOTE — Progress Notes (Signed)
 PROGRESS NOTE    Corey [name]  FMW:[id number] DOB: 1970-11-11 DOA: 09/28/2023 PCP: Bari Morgans, NP   Brief Narrative: 53 year old with Past medical history significant for ESRD on hemodialysis, calciphylaxis, paroxysmal A-fib on midodrine , type IB aortic dissection, peripheral artery disease, chronic lower extremity edema, blindness, nonambulatory, on chronic 4 L of oxygen , presented with worsening right leg wound over the last couple of weeks.  The wound has been leaking and Significant Pain.  Evaluation in the ED he was noted to be afebrile, lactic acid 3.2, x-ray right leg:  large dorsal defect to be due to wound. , limited assessment of osseous structures due to osteopenia, and bony reabsorption/destructive changes involving the 1st through 4th digits concerning for osteomyelitis.   Patient admitted for further care lower extremity wound, infection.    Assessment & Plan:   Principal Problem:   Leg wound, right Active Problems:   Calciphylaxis   Gangrene (HCC)   Cellulitis   Lactic acidosis   End-stage renal disease on hemodialysis (HCC)   Chronic respiratory failure with hypoxia (HCC)   Long term (current) use of anticoagulants [Z79.01]   Atrial fibrillation, chronic (HCC)   Dissection of thoracic aorta (HCC)   S/P AKA (above knee amputation), left (HCC)   Debility   Legally blind   Underweight   Osteomyelitis (HCC)  1-Right LE wound, infection, mixed Venous and vascular ulceration.  Calciphylaxis Presume Osteomyelitis 1 thorough 4 digits  -Patient presented with worsening wound of the right lower extremity related to calciphylaxis - X-ray right lower extremity concern for possible osteomyelitis - Vascular has been consulted - Continue IV vancomycin  and cefepime  -CRP 2.1, ESR 13. - Patient was evaluated by vascular Dr. Magda, who recommended trial of Unna boot therapy to induce healing of the venous ulcer, if unsuccessful after several weeks, he should consider  above-knee amputation. He is not a candidate for revascularization.  -ID consulted for guidance of antibiotics. Recommend 6 weeks IV vancomycin  cefepime  with HD. Flagyl for 7 days.   Cellulitis, Inguinal and Buttocks area:  -CT Pelvis: Skin thickening in the inferior medial left buttocks extending to the level of the anus compatible with cellulitis. No enhancing fluid collection or soft tissue gas. Nonspecific subcutaneous edema in the inferior mid buttocks bilaterally without fluid collection. CT scan negative for abscess.  Continue with IV antibiotics.   Lactic acidosis: -In setting infection Continue with IV antibiotics.   Thrombocytopenia:  In setting of infection. Monitor.   ESRD on hemodialysis Hyperkalemia - Nephrology  consulted. Appreciate HD assistance.   Chronic hypoxic respiratory failure On 4 L of oxygen  at home Chest x-ray noted cardiomegaly with diffuse interstitial opacity concerning for edema atelectasis or scaring at the bases. Stable. Volume manage with HD  Chronic hypotension: Continue with midodrine   A-fib a flutter on chronic anticoagulation - Continue amiodarone  - Heparin  on allergy list - Argatroban  per pharmacy  Type B thoracic aortic dissection with aneurysmal dilation of the proximal descending thoracic aorta  S/P Left AKA  Debility, legally blind  Underweight Start Breeze, nutrition consult.   Goals of Care:  Patient deconditioned, multiples medical problems. Multiples consultant recommend palliative care evaluation.  Discussed with patient, he agrees to speak with palliative  Estimated body mass index is 17.83 kg/m as calculated from the following:   Height as of this encounter: 6' 3 (1.905 m).   Weight as of this encounter: 64.7 kg.   DVT prophylaxis: Argatroban  Code Status: Full code Family Communication: care discussed with patient.  Disposition  Plan:  Status is: Inpatient Remains inpatient appropriate because: management of  Left LE wound    Consultants:  Vascular Nephrology   Procedures:    Antimicrobials:    Subjective: Appears very weak and tired. He agrees to speak with palliative care. We discussed importance of good nutrition.   Objective: Vitals:   09/29/23 1646 09/29/23 1937 09/29/23 2200 09/30/23 0339  BP: 104/66  (!) 98/54 92/77  Pulse: (!) 101   86  Resp: 18  16 20   Temp: 98 F (36.7 C)  (!) 97.5 F (36.4 C) 97.9 F (36.6 C)  TempSrc:   Oral   SpO2: 100%  100% 98%  Weight:      Height:  6' 3 (1.905 m)      Intake/Output Summary (Last 24 hours) at 09/30/2023 0910 Last data filed at 09/29/2023 2354 Gross per 24 hour  Intake 276.12 ml  Output 2500 ml  Net -2223.88 ml   Filed Weights   09/28/23 1331 09/29/23 0844  Weight: 64 kg 64.7 kg    Examination:  General exam: Chronic Ill appearing Respiratory system: Crackles bases.  Cardiovascular system: S 1, S 2 RRR Gastrointestinal system: BS present, soft, nt Central nervous system: Alert.  Extremities: left AKA. Right leg with unna boot.    Data Reviewed: I have personally reviewed following labs and imaging studies  CBC: Recent Labs  Lab 09/28/23 1402 09/29/23 0349  WBC 7.3 8.8  NEUTROABS 6.1  --   HGB 11.1* 11.7*  HCT 38.7* 42.0  MCV 96.8 98.6  PLT 127* 105*   Basic Metabolic Panel: Recent Labs  Lab 09/28/23 1402 09/29/23 0349 09/30/23 0453  NA 144 143 138  K 4.6 5.3* 4.6  CL 100 101 96*  CO2 25 17* 23  GLUCOSE 95 83 65*  BUN 67* 70* 43*  CREATININE 10.36* 10.17* 6.87*  CALCIUM  9.0 9.1 9.5  PHOS  --  9.7* 8.3*   GFR: Estimated Creatinine Clearance: 11.4 mL/min (A) (by C-G formula based on SCr of 6.87 mg/dL (H)). Liver Function Tests: Recent Labs  Lab 09/28/23 1402 09/29/23 0349 09/30/23 0453  AST 14*  --   --   ALT 9  --   --   ALKPHOS 230*  --   --   BILITOT 1.0  --   --   PROT 7.2  --   --   ALBUMIN  3.3* 3.0* 3.2*   No results for input(s): LIPASE, AMYLASE in the last 168  hours. No results for input(s): AMMONIA in the last 168 hours. Coagulation Profile: Recent Labs  Lab 09/28/23 1402 09/29/23 0349  INR 1.6* 4.2*   Cardiac Enzymes: No results for input(s): CKTOTAL, CKMB, CKMBINDEX, TROPONINI in the last 168 hours. BNP (last 3 results) No results for input(s): PROBNP in the last 8760 hours. HbA1C: No results for input(s): HGBA1C in the last 72 hours. CBG: Recent Labs  Lab 09/29/23 0915  GLUCAP 90   Lipid Profile: No results for input(s): CHOL, HDL, LDLCALC, TRIG, CHOLHDL, LDLDIRECT in the last 72 hours. Thyroid  Function Tests: No results for input(s): TSH, T4TOTAL, FREET4, T3FREE, THYROIDAB in the last 72 hours. Anemia Panel: No results for input(s): VITAMINB12, FOLATE, FERRITIN, TIBC, IRON, RETICCTPCT in the last 72 hours. Sepsis Labs: Recent Labs  Lab 09/28/23 1407 09/28/23 1611  LATICACIDVEN 3.2* 3.0*    Recent Results (from the past 240 hours)  Blood Culture (routine x 2)     Status: None (Preliminary result)   Collection Time: 09/28/23  2:05  PM   Specimen: BLOOD LEFT ARM  Result Value Ref Range Status   Specimen Description BLOOD LEFT ARM  Final   Special Requests   Final    BOTTLES DRAWN AEROBIC AND ANAEROBIC Blood Culture results may not be optimal due to an inadequate volume of blood received in culture bottles   Culture   Final    NO GROWTH 2 DAYS Performed at [facility], [address] [place], [place] [address]    Report Status PENDING  Incomplete  Blood Culture (routine x 2)     Status: None (Preliminary result)   Collection Time: 09/28/23  2:12 PM   Specimen: BLOOD LEFT FOREARM  Result Value Ref Range Status   Specimen Description BLOOD LEFT FOREARM  Final   Special Requests   Final    BOTTLES DRAWN AEROBIC AND ANAEROBIC Blood Culture adequate volume   Culture   Final    NO GROWTH 2 DAYS Performed at [facility], [address], [place], [place]  [address]    Report Status PENDING  Incomplete         Radiology Studies: CT PELVIS W CONTRAST Result Date: 09/28/2023 CLINICAL DATA:  Soft tissue infection suspected. EXAM: CT PELVIS WITH CONTRAST TECHNIQUE: Multidetector CT imaging of the pelvis was performed using the standard protocol following the bolus administration of intravenous contrast. RADIATION DOSE REDUCTION: This exam was performed according to the departmental dose-optimization program which includes automated exposure control, adjustment of the mA and/or kV according to patient size and/or use of iterative reconstruction technique. CONTRAST:  75mL OMNIPAQUE  IOHEXOL  350 MG/ML SOLN COMPARISON:  CT chest abdomen and pelvis 10/27/2022. FINDINGS: Urinary Tract: Bladder is completely decompressed and not well evaluated. Bowel: Unremarkable visualized pelvic bowel loops. Appendix is within normal limits. Vascular/Lymphatic: There are extensive peripheral vascular calcifications. Aortic calcifications and atherosclerotic plaque are also seen. Info IVC filter is partially visualized. No enlarged lymph nodes are identified. Reproductive:  Prostate gland within normal limits. Other: There is a small amount of free fluid in the pelvis. There is a fat containing umbilical hernia, small in size. Musculoskeletal: There is skin thickening in the inferior medial left buttocks extending to the level of the anus. No enhancing fluid collection or soft tissue gas. There is also some subcutaneous edema in the inferior mid buttocks bilaterally without fluid collection. Diffuse osseous sclerosis is again seen likely sequelae from chronic renal osteodystrophy. Degenerative changes affect the spine. IMPRESSION: 1. Skin thickening in the inferior medial left buttocks extending to the level of the anus compatible with cellulitis. No enhancing fluid collection or soft tissue gas. 2. Nonspecific subcutaneous edema in the inferior mid buttocks bilaterally without fluid  collection. 3. Small amount of free fluid in the pelvis. 4. Aortic atherosclerosis. Aortic Atherosclerosis (ICD10-I70.0). Electronically Signed   By: Greig Pique M.D.   On: 09/28/2023 16:39   DG Tibia/Fibula Right Result Date: 09/28/2023 CLINICAL DATA:  Possible sepsis wound right leg EXAM: RIGHT TIBIA AND FIBULA - 2 VIEW COMPARISON:  None Available. FINDINGS: Advanced vascular calcifications. Diffuse osteopenia limits assessment of osseous structures. Large wound or ulcer along the medial and posterior aspect of the mid to lower leg. Nonspecific mild periosteal reaction at the shafts of the distal tibia and fibula. IMPRESSION: Large wound or ulcer along the medial and posterior aspect of the mid to lower leg. Diffuse osteopenia limits assessment of osseous structures. Nonspecific mild periosteal reaction at the shafts of the distal tibia and fibula. MRI may be  obtained if suspicion for osseous infection Electronically Signed   By: Luke Bun M.D.   On: 09/28/2023 15:03   DG Foot Complete Right Result Date: 09/28/2023 CLINICAL DATA:  Wound EXAM: RIGHT FOOT COMPLETE - 3+ VIEW COMPARISON:  None Available. FINDINGS: Extensive vascular calcification. Marked osteopenia limits the exam. Large dorsal defect/deformity at the distal foot presumably due to wound or ulcer. Probable ulceration at the tip of the first distal phalanx. Suspicion of bony resorptive changes involving the first distal phalanx, second distal and middle phalanx, third distal and middle phalanx as well as distal portion of third proximal phalanx, and distal phalanx of fourth digit. No definite soft tissue gas. IMPRESSION: Large dorsal defect/deformity at the distal foot presumably due to wound or ulcer. Probable ulceration at the tip of the first distal phalanx. Limited assessment of osseous structures due to marked osteopenia. Bony resorptive/destructive changes involving the first through fourth digits as described above concerning for  osteomyelitis. Electronically Signed   By: Luke Bun M.D.   On: 09/28/2023 15:01   DG Chest Port 1 View Result Date: 09/28/2023 CLINICAL DATA:  Possible sepsis EXAM: PORTABLE CHEST 1 VIEW COMPARISON:  12/11/2022, 10/27/2022 FINDINGS: Right-sided central venous catheter tip at the right atrial IVC junction. Cardiomegaly with aortic atherosclerosis. Diffuse increased interstitial and ground-glass opacity probably represents edema. Linear atelectasis or scarring in the lower lungs. No pleural effusion or pneumothorax. IMPRESSION: Cardiomegaly with diffuse increased interstitial and ground-glass opacity probably representing edema. Atelectasis and or scarring at the bases Electronically Signed   By: Luke Bun M.D.   On: 09/28/2023 14:54        Scheduled Meds:  apixaban   2.5 mg Oral BID   Chlorhexidine  Gluconate Cloth  6 each Topical Q0600   Chlorhexidine  Gluconate Cloth  6 each Topical Q0600   feeding supplement  1 Container Oral TID BM   metroNIDAZOLE  500 mg Oral Q12H   midodrine   15 mg Oral TID WC   sodium chloride  flush  3 mL Intravenous Q12H   Continuous Infusions:  ceFEPime  (MAXIPIME ) IV Stopped (09/29/23 2049)   vancomycin        LOS: 2 days    Time spent: 35 minutes    Yitzchak Kothari A Pharrell Ledford, MD [facility]   If 7PM-7AM, please contact night-coverage [email or web address]  09/30/2023, 9:10 AM

## 2023-09-30 NOTE — Consult Note (Addendum)
 [facility]                                                                                        Patient Identification: Patient Name: Corey [name] MRN: [id number] Admit Date: 09/28/2023  1:26 PM Today's Date: 09/30/2023 Reason for consult: Osteomyelitis Requesting provider: Dr. Madelyne  Principal Problem:   Leg wound, right Active Problems:   Calciphylaxis   Long term (current) use of anticoagulants [Z79.01]   Dissection of thoracic aorta (HCC)   Atrial fibrillation, chronic (HCC)   End-stage renal disease on hemodialysis (HCC)   Gangrene (HCC)   Debility   S/P AKA (above knee amputation), left (HCC)   Lactic acidosis   Chronic respiratory failure with hypoxia (HCC)   Legally blind   Underweight   Cellulitis   Antibiotics:  Vancomycin  6/26- Cefepime  6/26-  Lines/Hardware:  Assessment # Venous ulcers of Rt lower extremity in the setting of known PAD admitted with worsening wound, pain, leaking, Xray with concerns for osteomyelitis opf 1st through 4th digits - Seen by vascular and considered not a revascularization candidate.  Possible venous ulceration with known PAD.  Recommended palliative care consult.  Recommended trial of penumbra therapy to induce healing of the venous ulcers.  If this is unsuccessful after several weeks, should consider AKA.  - cannot r/o infection/osteomyelitis currently, do not think MRI will be helpful as planned for conservative management and eventual need for amputation if fails. - ESR wnl and CRP  unimpressive   # Perineal/Buttock cellulitis - CT with no abscess or fournier's - on antibiotics as above - improving  Recommendations  - will presumptively treat for osteomyelitis.  Vancomycin  and cefepime  with HD for 6 weeks, EOT 8/6 To be coordinated with nephrology - Will start metronidazole 500 Mg p.o. twice daily for 7 days  - Management of  venous ulcer, PAD per vascular - Monitor CBC, BMP and vancomycin  trough, ESR CRP weekly - Agree with need for palliative care consult and discussion about GOC.  - Fu in the clinic arranged - Universal/standard isolation precaution - ID will sign off, recall back with questions or concerns  Rest of the management as per the primary team. Please call with questions or concerns.  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  Wound was worsening over the last couple of weeks prior to ED presentation with wound leaking, causing significant pain.  Is followed by wound care and vascular surgery outpatient.  Missed several rounds of HD due to pain and wound issues.   At ED afebrile Labs remarkable for WBC 7.3, lactic acid 3.2, ALP 230, AST 14, platelets 127 6/26 blood cultures no growth in less than 24 hours Imagings as below and reviewed Received IVF, IV fentanyl , Zofran , vancomycin  and cefepime    ROS:  General- Denies fever, chills, loss of appetite and loss of weight HEENT - Denies headache, blurry vision, neck pain, sinus pain Chest - Denies any chest pain, SOB or cough CVS- Denies any dizziness/lightheadedness, syncopal attacks, palpitations Abdomen- Denies any nausea, vomiting, abdominal pain, hematochezia and diarrhea Neuro - Denies any weakness, numbness, tingling sensation Psych - Denies any changes in mood irritability or depressive symptoms GU- Denies any burning, dysuria, hematuria or increased frequency of  urination Skin - denies any rashes/lesions MSK - denies any joint pain/swelling or restricted ROM   Past Medical History:  Diagnosis Date   Arthritis    hands and shoulders   Blindness and low vision    Stargardt disease   Dissection of aorta (HCC) 2004   a. s/p extensive repeair in 2004 in [place] complicated by ESRD, lower extremity paralysis, coma, and extended hospitalization of 2 years   ESRD (end stage renal disease) (HCC)    a. TTS   Headache    History of cardioversion 2014   Hypertension    Neuropathy    Non-healing non-surgical wound 03/2016   PAF (paroxysmal atrial fibrillation) (HCC)    a. s/p DCCV in 2014; b. on Coumadin ; c. CHADS2VASc => 2 (HTN, vascular disease)   Paralysis (HCC)    due to dissection of aorta in 2004, lower extremities   Pneumonia    Past Surgical History:  Procedure Laterality Date   AMPUTATION Left 11/17/2022   Procedure: AMPUTATION ABOVE KNEE;  Surgeon: Serene Gaile ORN, MD;  Location: [facility] OR;  Service: Vascular;  Laterality: Left;   APPLICATION OF WOUND VAC Left 04/13/2016   Procedure: APPLICATION OF WOUND VAC;  Surgeon: Redell LITTIE Door, MD;  Location: [facility] OR;  Service: Vascular;  Laterality: Left;   APPLICATION OF WOUND VAC Left 04/18/2016   Procedure: APPLICATION OF WOUND VAC;  Surgeon: Penne Lonni Colorado, MD;  Location: [facility] OR;  Service: Vascular;  Laterality: Left;  Wound vac change    APPLICATION OF WOUND VAC Left 04/20/2016   Procedure: WOUND VAC CHANGE;  Surgeon: Redell LITTIE Door, MD;  Location: [facility] OR;  Service: Vascular;  Laterality: Left;   AV FISTULA PLACEMENT     BASCILIC VEIN TRANSPOSITION Left 12/09/2015   Procedure: FIRST STAGE BASILIC VEIN TRANSPOSITION LEFT UPPER ARM;  Surgeon: Redell LITTIE Door, MD;  Location: [facility] OR;  Service: Vascular;  Laterality: Left;   BASCILIC VEIN TRANSPOSITION Left 03/09/2016   Procedure: SECOND STAGE BASILIC VEIN TRANSPOSITION WITH REVISION OF ANASTOMOSIS LEFT UPPER ARM;  Surgeon: Redell LITTIE Door, MD;  Location: [facility] OR;   Service: Vascular;  Laterality: Left;   CARDIOVERSION     CARDIOVERSION N/A 01/04/2019   Procedure: CARDIOVERSION;  Surgeon: Pietro Redell RAMAN, MD;  Location: [facility] ENDOSCOPY;  Service: Cardiovascular;  Laterality: N/A;   CHEST TUBE INSERTION Left 11/02/2022   Procedure: CHEST TUBE INSERTION;  Surgeon: Arlinda Ranks, MD;  Location: [facility] ENDOSCOPY;  Service: Pulmonary;  Laterality: Left;  Please have both Pigtail (COOK) and chest tube tray available   REPAIR OF ACUTE ASCENDING THORACIC AORTIC DISSECTION     REVISON OF ARTERIOVENOUS FISTULA Left 04/20/2016   Procedure: LIGATION OF BASILIC VEIN TRANSPOSITION;  Surgeon: Redell LITTIE Door, MD;  Location: [facility] OR;  Service: Vascular;  Laterality: Left;   WOUND DEBRIDEMENT Left 04/13/2016   Procedure: DEBRIDEMENT WOUND;  Surgeon: Redell LITTIE Door, MD;  Location: [facility] OR;  Service: Vascular;  Laterality: Left;     Scheduled Meds:  apixaban   2.5 mg Oral BID   Chlorhexidine   Gluconate Cloth  6 each Topical Q0600   Chlorhexidine  Gluconate Cloth  6 each Topical Q0600   midodrine   15 mg Oral TID WC   sodium chloride  flush  3 mL Intravenous Q12H   Continuous Infusions:  ceFEPime  (MAXIPIME ) IV Stopped (09/29/23 2049)   vancomycin      PRN Meds:.acetaminophen  **OR** acetaminophen , albuterol , fentaNYL  (SUBLIMAZE ) injection, oxyCODONE -acetaminophen   Allergies  Allergen Reactions   Ciprofloxacin Other (See Comments)    Aortic dissection   Heparin  Other (See Comments)    UNSPECIFIED REACTION :  On Coumadin  since 2004   HIT panel negative 01/19/17   Doxercalciferol  Other (See Comments)   Tomato    Quinolones Other (See Comments)    Unknown reaction   Social History   Socioeconomic History   Marital status: Divorced    Spouse name: Not on file   Number of children: 2   Years of education: Not on file   Highest education level: Not on file  Occupational History   Occupation: DIABLED  Tobacco Use   Smoking status: Never   Smokeless tobacco: Never  Vaping Use    Vaping status: Never Used  Substance and Sexual Activity   Alcohol use: No   Drug use: No   Sexual activity: Not on file  Other Topics Concern   Not on file  Social History Narrative   Not on file   Social Drivers of Health   Financial Resource Strain: Medium Risk (09/26/2022)   Overall Financial Resource Strain (CARDIA)    Difficulty of Paying Living Expenses: Somewhat hard  Food Insecurity: No Food Insecurity (09/28/2023)   Hunger Vital Sign    Worried About Running Out of Food in the Last Year: Never true    Ran Out of Food in the Last Year: Never true  Transportation Needs: No Transportation Needs (09/28/2023)   PRAPARE - Administrator, Civil Service (Medical): No    Lack of Transportation (Non-Medical): No  Physical Activity: Inactive (09/26/2022)   Exercise Vital Sign    Days of Exercise per Week: 0 days    Minutes of Exercise per Session: 0 min  Stress: No Stress Concern Present (09/26/2022)   [employer] of Occupational Health - Occupational Stress Questionnaire    Feeling of Stress : Not at all  Social Connections: Socially Isolated (09/26/2022)   Social Connection and Isolation Panel    Frequency of Communication with Friends and Family: More than three times a week    Frequency of Social Gatherings with Friends and Family: Twice a week    Attends Religious Services: Never    Database administrator or Organizations: No    Attends Banker Meetings: Never    Marital Status: Divorced  Catering manager Violence: Not At Risk (09/28/2023)   Humiliation, Afraid, Rape, and Kick questionnaire    Fear of Current or Ex-Partner: No    Emotionally Abused: No    Physically Abused: No    Sexually Abused: No   Family History  Problem Relation Age of Onset   Cancer Mother    Hypertension Mother    Cancer Father    Hypertension Father    Thyroid  disease Sister    Stroke Brother        11    Vitals BP 92/77 (BP Location: Left Arm)   Pulse 86    Temp 97.9 F (36.6 C)   Resp 20   Ht 6' 3 (1.905 m)   Wt 64.7 kg   SpO2  98%   BMI 17.83 kg/m    Physical Exam Constitutional: Chronically ill-appearing male lying in the bed, fragile, debilitated    Comments: Not in acute distress, HEENT WNL  Cardiovascular:     Rate and Rhythm: Normal rate and regular rhythm.     Heart sounds: s1s2  Pulmonary:     Effort: Pulmonary effort is normal on nasal cannula    Comments: Normal breath sounds  Abdominal:     Palpations: Abdomen is soft.     Tenderness: Nondistended and nontender  GU exam deferred   Musculoskeletal:        General: S/p left AKA, right leg is wrapped in a Unna boot  Skin:    Comments: No rashes, right subclavian HD catheter with no signs of infection, ? Left UE AVF  Neurological:     General: Awake alert and oriented but very minimally interactive   Pertinent Microbiology Results for orders placed or performed during the hospital encounter of 09/28/23  Blood Culture (routine x 2)     Status: None (Preliminary result)   Collection Time: 09/28/23  2:05 PM   Specimen: BLOOD LEFT ARM  Result Value Ref Range Status   Specimen Description BLOOD LEFT ARM  Final   Special Requests   Final    BOTTLES DRAWN AEROBIC AND ANAEROBIC Blood Culture results may not be optimal due to an inadequate volume of blood received in culture bottles   Culture   Final    NO GROWTH < 24 HOURS Performed at [facility], [address], [place], [place] [address]    Report Status PENDING  Incomplete  Blood Culture (routine x 2)     Status: None (Preliminary result)   Collection Time: 09/28/23  2:12 PM   Specimen: BLOOD LEFT FOREARM  Result Value Ref Range Status   Specimen Description BLOOD LEFT FOREARM  Final   Special Requests   Final    BOTTLES DRAWN AEROBIC AND ANAEROBIC Blood Culture adequate volume   Culture   Final    NO GROWTH < 24 HOURS Performed at [facility], [address] [place], [place] [address]     Report Status PENDING  Incomplete   Pertinent Lab seen by me:    Latest Ref Rng & Units 09/29/2023    3:49 AM 09/28/2023    2:02 PM 12/10/2022    6:54 AM  CBC  WBC 4.0 - 10.5 K/uL 8.8  7.3  7.6   Hemoglobin 13.0 - 17.0 g/dL 88.2  88.8  8.7   Hematocrit 39.0 - 52.0 % 42.0  38.7  31.5   Platelets 150 - 400 K/uL 105  127  213       Latest Ref Rng & Units 09/30/2023    4:53 AM 09/29/2023    3:49 AM 09/28/2023    2:02 PM  CMP  Glucose 70 - 99 mg/dL 65  83  95   BUN 6 - 20 mg/dL 43  70  67   Creatinine 0.61 - 1.24 mg/dL 3.12  89.82  89.63   Sodium 135 - 145 mmol/L 138  143  144   Potassium 3.5 - 5.1 mmol/L 4.6  5.3  4.6   Chloride 98 - 111 mmol/L 96  101  100   CO2 22 - 32 mmol/L 23  17  25    Calcium  8.9 - 10.3 mg/dL 9.5  9.1  9.0   Total Protein 6.5 - 8.1 g/dL   7.2   Total  Bilirubin 0.0 - 1.2 mg/dL   1.0   Alkaline Phos 38 - 126 U/L   230   AST 15 - 41 U/L   14   ALT 0 - 44 U/L   9      Pertinent Imagings/Other Imagings Plain films and CT images have been personally visualized and interpreted; radiology reports have been reviewed. Decision making incorporated into the Impression / Recommendations.  CT PELVIS W CONTRAST Result Date: 09/28/2023 CLINICAL DATA:  Soft tissue infection suspected. EXAM: CT PELVIS WITH CONTRAST TECHNIQUE: Multidetector CT imaging of the pelvis was performed using the standard protocol following the bolus administration of intravenous contrast. RADIATION DOSE REDUCTION: This exam was performed according to the departmental dose-optimization program which includes automated exposure control, adjustment of the mA and/or kV according to patient size and/or use of iterative reconstruction technique. CONTRAST:  75mL OMNIPAQUE  IOHEXOL  350 MG/ML SOLN COMPARISON:  CT chest abdomen and pelvis 10/27/2022. FINDINGS: Urinary Tract: Bladder is completely decompressed and not well evaluated. Bowel: Unremarkable visualized pelvic bowel loops. Appendix is within normal  limits. Vascular/Lymphatic: There are extensive peripheral vascular calcifications. Aortic calcifications and atherosclerotic plaque are also seen. Info IVC filter is partially visualized. No enlarged lymph nodes are identified. Reproductive:  Prostate gland within normal limits. Other: There is a small amount of free fluid in the pelvis. There is a fat containing umbilical hernia, small in size. Musculoskeletal: There is skin thickening in the inferior medial left buttocks extending to the level of the anus. No enhancing fluid collection or soft tissue gas. There is also some subcutaneous edema in the inferior mid buttocks bilaterally without fluid collection. Diffuse osseous sclerosis is again seen likely sequelae from chronic renal osteodystrophy. Degenerative changes affect the spine. IMPRESSION: 1. Skin thickening in the inferior medial left buttocks extending to the level of the anus compatible with cellulitis. No enhancing fluid collection or soft tissue gas. 2. Nonspecific subcutaneous edema in the inferior mid buttocks bilaterally without fluid collection. 3. Small amount of free fluid in the pelvis. 4. Aortic atherosclerosis. Aortic Atherosclerosis (ICD10-I70.0). Electronically Signed   By: Greig Pique M.D.   On: 09/28/2023 16:39   DG Tibia/Fibula Right Result Date: 09/28/2023 CLINICAL DATA:  Possible sepsis wound right leg EXAM: RIGHT TIBIA AND FIBULA - 2 VIEW COMPARISON:  None Available. FINDINGS: Advanced vascular calcifications. Diffuse osteopenia limits assessment of osseous structures. Large wound or ulcer along the medial and posterior aspect of the mid to lower leg. Nonspecific mild periosteal reaction at the shafts of the distal tibia and fibula. IMPRESSION: Large wound or ulcer along the medial and posterior aspect of the mid to lower leg. Diffuse osteopenia limits assessment of osseous structures. Nonspecific mild periosteal reaction at the shafts of the distal tibia and fibula. MRI may be  obtained if suspicion for osseous infection Electronically Signed   By: Luke Bun M.D.   On: 09/28/2023 15:03   DG Foot Complete Right Result Date: 09/28/2023 CLINICAL DATA:  Wound EXAM: RIGHT FOOT COMPLETE - 3+ VIEW COMPARISON:  None Available. FINDINGS: Extensive vascular calcification. Marked osteopenia limits the exam. Large dorsal defect/deformity at the distal foot presumably due to wound or ulcer. Probable ulceration at the tip of the first distal phalanx. Suspicion of bony resorptive changes involving the first distal phalanx, second distal and middle phalanx, third distal and middle phalanx as well as distal portion of third proximal phalanx, and distal phalanx of fourth digit. No definite soft tissue gas. IMPRESSION: Large dorsal defect/deformity at the distal  foot presumably due to wound or ulcer. Probable ulceration at the tip of the first distal phalanx. Limited assessment of osseous structures due to marked osteopenia. Bony resorptive/destructive changes involving the first through fourth digits as described above concerning for osteomyelitis. Electronically Signed   By: Luke Bun M.D.   On: 09/28/2023 15:01   DG Chest Port 1 View Result Date: 09/28/2023 CLINICAL DATA:  Possible sepsis EXAM: PORTABLE CHEST 1 VIEW COMPARISON:  12/11/2022, 10/27/2022 FINDINGS: Right-sided central venous catheter tip at the right atrial IVC junction. Cardiomegaly with aortic atherosclerosis. Diffuse increased interstitial and ground-glass opacity probably represents edema. Linear atelectasis or scarring in the lower lungs. No pleural effusion or pneumothorax. IMPRESSION: Cardiomegaly with diffuse increased interstitial and ground-glass opacity probably representing edema. Atelectasis and or scarring at the bases Electronically Signed   By: Luke Bun M.D.   On: 09/28/2023 14:54     I spent 85 minutes involved in face-to-face and non-face-to-face activities for this patient on the day of the visit.  Professional time spent includes the following activities: Preparing to see the patient (review of tests), Obtaining and reviewing separately obtained history (admission/discharge record), Performing a medically appropriate examination and evaluation , Ordering medications/labs, referring and communicating with other health care professionals, Documenting clinical information in the EMR, Independently interpreting results (not separately reported), Communicating results to the patient/family, Counseling and educating the patient/family and Care coordination (not separately reported).  Electronically signed by:   Plan d/w requesting provider as well as ID pharm D  Of note, portions of this note may have been created with voice recognition software. While this note has been edited for accuracy, occasional wrong-word or 'sound-a-like' substitutions may have occurred due to the inherent limitations of voice recognition software.   Annalee Orem, MD Infectious Disease Physician [facility] Pager: [phone]

## 2023-09-30 NOTE — Progress Notes (Signed)
  Daily Progress Note Subjective: Resting in bed this morning, having R leg pain  Objective: Vitals:   09/30/23 1234 09/30/23 1236  BP: (!) 143/116 (!) 132/53  Pulse: 91 70  Resp: 18 18  Temp: (!) 97.5 F (36.4 C) (!) 97.5 F (36.4 C)  SpO2: 100%     Physical Examination HDS Nonlabored breathing Unna boot in place on RLE  ASSESSMENT/PLAN:  53 y.o. male with right lower extremity mixed ulceration. His wounds appear typical of venous ulceration. He has known peripheral arterial disease. He is not a revascularization candidate.  XR concerning for osteo of 1st through 4th digits. Would require amputation for definitive management. Plan to continue unna boot therapy for venous ulcers and recommend palliative consult as he originally was not interested in above knee amputation when discussed with Dr Magda.    Norman GORMAN Serve MD [facility] [phone] 09/30/2023  2:34 PM

## 2023-09-30 NOTE — Plan of Care (Signed)

## 2023-10-01 ENCOUNTER — Encounter (HOSPITAL_COMMUNITY): Payer: Self-pay | Admitting: Internal Medicine

## 2023-10-01 ENCOUNTER — Inpatient Hospital Stay (HOSPITAL_COMMUNITY)

## 2023-10-01 DIAGNOSIS — R6520 Severe sepsis without septic shock: Secondary | ICD-10-CM

## 2023-10-01 DIAGNOSIS — J9621 Acute and chronic respiratory failure with hypoxia: Secondary | ICD-10-CM | POA: Diagnosis not present

## 2023-10-01 DIAGNOSIS — J9622 Acute and chronic respiratory failure with hypercapnia: Secondary | ICD-10-CM

## 2023-10-01 DIAGNOSIS — Z7189 Other specified counseling: Secondary | ICD-10-CM

## 2023-10-01 DIAGNOSIS — Z515 Encounter for palliative care: Secondary | ICD-10-CM | POA: Diagnosis not present

## 2023-10-01 DIAGNOSIS — S81801A Unspecified open wound, right lower leg, initial encounter: Secondary | ICD-10-CM | POA: Diagnosis not present

## 2023-10-01 DIAGNOSIS — I4891 Unspecified atrial fibrillation: Secondary | ICD-10-CM

## 2023-10-01 DIAGNOSIS — A419 Sepsis, unspecified organism: Secondary | ICD-10-CM

## 2023-10-01 LAB — CBC
HCT: 41.6 % (ref 39.0–52.0)
HCT: 38.9 % — ABNORMAL LOW (ref 39.0–52.0)
Hemoglobin: 12 g/dL — ABNORMAL LOW (ref 13.0–17.0)
Hemoglobin: 11.4 g/dL — ABNORMAL LOW (ref 13.0–17.0)
MCH: 27.9 pg (ref 26.0–34.0)
MCH: 28.4 pg (ref 26.0–34.0)
MCHC: 28.8 g/dL — ABNORMAL LOW (ref 30.0–36.0)
MCHC: 29.3 g/dL — ABNORMAL LOW (ref 30.0–36.0)
MCV: 96.7 fL (ref 80.0–100.0)
MCV: 96.8 fL (ref 80.0–100.0)
Platelets: 118 10*3/uL — ABNORMAL LOW (ref 150–400)
Platelets: 118 10*3/uL — ABNORMAL LOW (ref 150–400)
RBC: 4.3 MIL/uL (ref 4.22–5.81)
RBC: 4.02 MIL/uL — ABNORMAL LOW (ref 4.22–5.81)
RDW: 17.2 % — ABNORMAL HIGH (ref 11.5–15.5)
RDW: 17.3 % — ABNORMAL HIGH (ref 11.5–15.5)
WBC: 13 10*3/uL — ABNORMAL HIGH (ref 4.0–10.5)
WBC: 13.8 10*3/uL — ABNORMAL HIGH (ref 4.0–10.5)
nRBC: 0.4 % — ABNORMAL HIGH (ref 0.0–0.2)
nRBC: 0.3 % — ABNORMAL HIGH (ref 0.0–0.2)

## 2023-10-01 LAB — GLUCOSE, CAPILLARY
Glucose-Capillary: 111 mg/dL — ABNORMAL HIGH (ref 70–99)
Glucose-Capillary: 112 mg/dL — ABNORMAL HIGH (ref 70–99)
Glucose-Capillary: 217 mg/dL — ABNORMAL HIGH (ref 70–99)
Glucose-Capillary: 32 mg/dL — CL (ref 70–99)
Glucose-Capillary: 50 mg/dL — ABNORMAL LOW (ref 70–99)
Glucose-Capillary: 52 mg/dL — ABNORMAL LOW (ref 70–99)
Glucose-Capillary: 56 mg/dL — ABNORMAL LOW (ref 70–99)
Glucose-Capillary: 67 mg/dL — ABNORMAL LOW (ref 70–99)
Glucose-Capillary: 70 mg/dL (ref 70–99)
Glucose-Capillary: 76 mg/dL (ref 70–99)
Glucose-Capillary: 84 mg/dL (ref 70–99)

## 2023-10-01 LAB — BLOOD GAS, VENOUS
Acid-base deficit: 4.7 mmol/L — ABNORMAL HIGH (ref 0.0–2.0)
Acid-base deficit: 7 mmol/L — ABNORMAL HIGH (ref 0.0–2.0)
Bicarbonate: 25.8 mmol/L (ref 20.0–28.0)
Bicarbonate: 22.6 mmol/L (ref 20.0–28.0)
O2 Saturation: 43.2 %
O2 Saturation: 33.9 %
Patient temperature: 36.4
Patient temperature: 37
pCO2, Ven: 72 mmHg (ref 44–60)
pCO2, Ven: 65 mmHg — ABNORMAL HIGH (ref 44–60)
pH, Ven: 7.16 — CL (ref 7.25–7.43)
pH, Ven: 7.15 — CL (ref 7.25–7.43)
pO2, Ven: 32 mmHg (ref 32–45)
pO2, Ven: <31 mmHg — CL (ref 32–45)

## 2023-10-01 LAB — COMPREHENSIVE METABOLIC PANEL WITH GFR
ALT: 12 U/L (ref 0–44)
AST: 23 U/L (ref 15–41)
Albumin: 2.7 g/dL — ABNORMAL LOW (ref 3.5–5.0)
Alkaline Phosphatase: 195 U/L — ABNORMAL HIGH (ref 38–126)
Anion gap: 16 — ABNORMAL HIGH (ref 5–15)
BUN: 57 mg/dL — ABNORMAL HIGH (ref 6–20)
CO2: 23 mmol/L (ref 22–32)
Calcium: 8.5 mg/dL — ABNORMAL LOW (ref 8.9–10.3)
Chloride: 99 mmol/L (ref 98–111)
Creatinine, Ser: 7.53 mg/dL — ABNORMAL HIGH (ref 0.61–1.24)
GFR, Estimated: 8 mL/min — ABNORMAL LOW (ref >60)
Glucose, Bld: 99 mg/dL (ref 70–99)
Potassium: 4.3 mmol/L (ref 3.5–5.1)
Sodium: 138 mmol/L (ref 135–145)
Total Bilirubin: 0.9 mg/dL (ref 0.0–1.2)
Total Protein: 6 g/dL — ABNORMAL LOW (ref 6.5–8.1)

## 2023-10-01 LAB — TSH: TSH: 3.88 u[IU]/mL (ref 0.350–4.500)

## 2023-10-01 LAB — RENAL FUNCTION PANEL
Albumin: 2.9 g/dL — ABNORMAL LOW (ref 3.5–5.0)
Anion gap: 18 — ABNORMAL HIGH (ref 5–15)
BUN: 58 mg/dL — ABNORMAL HIGH (ref 6–20)
CO2: 23 mmol/L (ref 22–32)
Calcium: 8.7 mg/dL — ABNORMAL LOW (ref 8.9–10.3)
Chloride: 94 mmol/L — ABNORMAL LOW (ref 98–111)
Creatinine, Ser: 7.84 mg/dL — ABNORMAL HIGH (ref 0.61–1.24)
GFR, Estimated: 8 mL/min — ABNORMAL LOW (ref >60)
Glucose, Bld: 147 mg/dL — ABNORMAL HIGH (ref 70–99)
Phosphorus: 8.6 mg/dL — ABNORMAL HIGH (ref 2.5–4.6)
Potassium: 4.7 mmol/L (ref 3.5–5.1)
Sodium: 135 mmol/L (ref 135–145)

## 2023-10-01 LAB — BLOOD GAS, ARTERIAL
Acid-base deficit: 6.5 mmol/L — ABNORMAL HIGH (ref 0.0–2.0)
Bicarbonate: 21.7 mmol/L (ref 20.0–28.0)
Drawn by: 55062
O2 Saturation: 93.5 %
Patient temperature: 36.4
pCO2 arterial: 52 mmHg — ABNORMAL HIGH (ref 32–48)
pH, Arterial: 7.23 — ABNORMAL LOW (ref 7.35–7.45)
pO2, Arterial: 71 mmHg — ABNORMAL LOW (ref 83–108)

## 2023-10-01 LAB — BASIC METABOLIC PANEL WITH GFR
Anion gap: 20 — ABNORMAL HIGH (ref 5–15)
BUN: 56 mg/dL — ABNORMAL HIGH (ref 6–20)
CO2: 21 mmol/L — ABNORMAL LOW (ref 22–32)
Calcium: 9.1 mg/dL (ref 8.9–10.3)
Chloride: 95 mmol/L — ABNORMAL LOW (ref 98–111)
Creatinine, Ser: 7.65 mg/dL — ABNORMAL HIGH (ref 0.61–1.24)
GFR, Estimated: 8 mL/min — ABNORMAL LOW (ref >60)
Glucose, Bld: 98 mg/dL (ref 70–99)
Potassium: 4.8 mmol/L (ref 3.5–5.1)
Sodium: 136 mmol/L (ref 135–145)

## 2023-10-01 LAB — TROPONIN I (HIGH SENSITIVITY)
Troponin I (High Sensitivity): 186 ng/L (ref <18)
Troponin I (High Sensitivity): 61 ng/L — ABNORMAL HIGH (ref <18)

## 2023-10-01 LAB — GLUCOSE, RANDOM: Glucose, Bld: 194 mg/dL — ABNORMAL HIGH (ref 70–99)

## 2023-10-01 LAB — CORTISOL-AM, BLOOD: Cortisol - AM: 28.3 ug/dL — ABNORMAL HIGH (ref 6.7–22.6)

## 2023-10-01 LAB — MAGNESIUM: Magnesium: 2.4 mg/dL (ref 1.7–2.4)

## 2023-10-01 MED ORDER — SODIUM CHLORIDE 0.9 % IV SOLN: 250.0000 mL | INTRAVENOUS | Status: AC

## 2023-10-01 MED ORDER — DEXTROSE 50 % IV SOLN
INTRAVENOUS | Status: AC
  Administered 2023-10-01: 25 mL
  Filled 2023-10-01: qty 50

## 2023-10-01 MED ORDER — ALTEPLASE 2 MG IJ SOLR: 2.0000 mg | Freq: Once | INTRAMUSCULAR | Status: DC | PRN

## 2023-10-01 MED ORDER — DEXMEDETOMIDINE HCL IN NACL 400 MCG/100ML IV SOLN
0.0000 ug/kg/h | INTRAVENOUS | Status: DC
  Administered 2023-10-01: 0.4 ug/kg/h via INTRAVENOUS
  Filled 2023-10-01: qty 100

## 2023-10-01 MED ORDER — AMIODARONE HCL IN DEXTROSE 360-4.14 MG/200ML-% IV SOLN
60.0000 mg/h | INTRAVENOUS | Status: AC
  Administered 2023-10-01 (×2): 60 mg/h via INTRAVENOUS
  Filled 2023-10-01: qty 200

## 2023-10-01 MED ORDER — THIAMINE HCL 100 MG/ML IJ SOLN
100.0000 mg | Freq: Every day | INTRAMUSCULAR | Status: DC
  Administered 2023-10-01 – 2023-10-03 (×3): 100 mg via INTRAVENOUS
  Filled 2023-10-01 (×3): qty 2

## 2023-10-01 MED ORDER — MIDODRINE HCL 5 MG PO TABS
10.0000 mg | ORAL_TABLET | ORAL | Status: AC
  Administered 2023-10-01: 10 mg via ORAL

## 2023-10-01 MED ORDER — ANTICOAGULANT SODIUM CITRATE 4% (200MG/5ML) IV SOLN
5.0000 mL | Status: DC | PRN
Start: 2023-10-01 — End: 2023-10-04
  Administered 2023-10-01: 3800 mL
  Filled 2023-10-01 (×2): qty 5

## 2023-10-01 MED ORDER — VANCOMYCIN VARIABLE DOSE PER UNSTABLE RENAL FUNCTION (PHARMACIST DOSING): Status: DC

## 2023-10-01 MED ORDER — DEXTROSE 50 % IV SOLN
50.0000 mL | INTRAVENOUS | Status: DC | PRN
  Administered 2023-10-01 – 2023-10-04 (×13): 50 mL via INTRAVENOUS
  Filled 2023-10-01 (×11): qty 50

## 2023-10-01 MED ORDER — AMIODARONE HCL IN DEXTROSE 360-4.14 MG/200ML-% IV SOLN
INTRAVENOUS | Status: AC
  Filled 2023-10-01: qty 200

## 2023-10-01 MED ORDER — ALBUMIN HUMAN 25 % IV SOLN
25.0000 g | Freq: Once | INTRAVENOUS | Status: AC
  Administered 2023-10-01: 12.5 g via INTRAVENOUS
  Filled 2023-10-01: qty 100

## 2023-10-01 MED ORDER — METRONIDAZOLE 500 MG/100ML IV SOLN
500.0000 mg | Freq: Two times a day (BID) | INTRAVENOUS | Status: DC
  Administered 2023-10-01: 500 mg via INTRAVENOUS
  Filled 2023-10-01: qty 100

## 2023-10-01 MED ORDER — DEXTROSE 50 % IV SOLN: 12.5000 g | Freq: Once | INTRAVENOUS | Status: AC

## 2023-10-01 MED ORDER — SODIUM CHLORIDE 0.9 % IV SOLN
1.0000 g | Freq: Once | INTRAVENOUS | Status: AC
  Administered 2023-10-01: 1 g via INTRAVENOUS
  Filled 2023-10-01: qty 20

## 2023-10-01 MED ORDER — CHLORHEXIDINE GLUCONATE CLOTH 2 % EX PADS: 6.0000 | MEDICATED_PAD | Freq: Every day | CUTANEOUS | Status: DC

## 2023-10-01 MED ORDER — SODIUM CHLORIDE 0.9 % IV SOLN
500.0000 mg | INTRAVENOUS | Status: DC
  Administered 2023-10-02: 500 mg via INTRAVENOUS
  Filled 2023-10-01: qty 10

## 2023-10-01 MED ORDER — NOREPINEPHRINE 4 MG/250ML-% IV SOLN
0.0000 ug/min | INTRAVENOUS | Status: DC
  Administered 2023-10-01: 9 ug/min via INTRAVENOUS
  Administered 2023-10-01: 3 ug/min via INTRAVENOUS
  Administered 2023-10-02: 2 ug/min via INTRAVENOUS
  Administered 2023-10-03 (×2): 3 ug/min via INTRAVENOUS
  Filled 2023-10-01 (×5): qty 250

## 2023-10-01 MED ORDER — AMIODARONE IV BOLUS ONLY 150 MG/100ML
150.0000 mg | Freq: Once | INTRAVENOUS | Status: AC
  Administered 2023-10-01: 150 mg via INTRAVENOUS

## 2023-10-01 MED ORDER — VANCOMYCIN HCL 750 MG/150ML IV SOLN
750.0000 mg | Freq: Once | INTRAVENOUS | Status: AC
  Administered 2023-10-01: 750 mg via INTRAVENOUS
  Filled 2023-10-01 (×2): qty 150

## 2023-10-01 MED ORDER — ALBUMIN HUMAN 25 % IV SOLN
25.0000 g | INTRAVENOUS | Status: DC | PRN
  Administered 2023-10-01: 25 g via INTRAVENOUS
  Filled 2023-10-01: qty 100

## 2023-10-01 MED ORDER — SODIUM CHLORIDE 0.9 % IV SOLN: 2.0000 g | INTRAVENOUS | Status: DC

## 2023-10-01 MED ORDER — AMIODARONE HCL IN DEXTROSE 360-4.14 MG/200ML-% IV SOLN
60.0000 mg/h | INTRAVENOUS | Status: DC
  Administered 2023-10-01: 30 mg/h via INTRAVENOUS
  Administered 2023-10-02 – 2023-10-04 (×10): 60 mg/h via INTRAVENOUS
  Filled 2023-10-01 (×4): qty 200
  Filled 2023-10-01: qty 400
  Filled 2023-10-01: qty 200
  Filled 2023-10-01: qty 400
  Filled 2023-10-01 (×2): qty 200

## 2023-10-01 NOTE — Progress Notes (Signed)
 PT Cancellation Note  Patient Details Name: Corey [name] MRN: [id number] DOB: 06-04-1970   Cancelled Treatment:    Reason Eval/Treat Not Completed: Fatigue/lethargy limiting ability to participate (Pt lethargic and requiring up to 20 seconds to process and respond to yes/no questions. pt unable to remain alert for more than 20 seconds. SpO2 83-84% on 6L/min HFNC, adjusted to 8L and SpO2 >90%. RN notified.)   Vernell KNIGHTS PT, DPT Acute Rehabilitation Services Office [phone]  10/01/23 10:31 AM

## 2023-10-01 NOTE — Progress Notes (Addendum)
 TRH, hospitalist service, overnight cross coverage.    Received a call from bedside RN regarding the patient being lethargic, a change from yesterday.  Presented at bedside, the patient is somnolent but arousable to voices.  Slow to respond to questions, however follows commands.  Diffuse bilateral rales noted on lungs auscultation.  Stat VBG revealed pH 7.16 and PCO2 of 72.  The patient declined to use the BiPAP.  Became a little more arousable later.  Repeated VBG showed pH of 7.23 and PCO2 of 52.    Stat chest x-ray showed significant increase in pulmonary vascularity consistent with pulmonary edema.  The patient was on slow rate D10w for recurrent hypoglycemia.  IV fluid DC'd.  IV D50 as needed and CBG every 2 hours added.  Per RN his fingers have been cold.  CBG 67 and serum glucose 147, unclear if CBG was inaccurate at that time.  The patient is anuric and will likely not respond to IV diuretics.  He will benefit from urgent hemodialysis.  Curb sided with PCCM who also recommends urgent hemodialysis.  Nephrology notified.  Due to concern for cefepime  possibly contributing to his encephalopathy, cefepime  was held and replaced by Merrem to reduce neurotoxicity risk.   Critical care time: 35 minutes.

## 2023-10-01 NOTE — Progress Notes (Signed)
 eLink Physician-Brief Progress Note Patient Name: ELIU BATCH DOB: 11-30-1970 MRN: [id number]   Date of Service  10/01/2023  HPI/Events of Note  53 year old with end-stage renal disease on hemodialysis and chronic respiratory failure on home oxygen  and paroxysmal atrial fibrillation on chronic midodrine .  She is finished HD 20 minutes ago with 3 L coming off.  Precedex is currently at 0.2 and amiodarone  infusion is running at 30.  He developed A-fib with RVR at a rate of 165 bpm.  eICU Interventions  Will administer albumin  25% bolus, increase amiodarone  back to 60 mg dosing for now     Intervention Category Intermediate Interventions: Arrhythmia - evaluation and management  Azalie Harbeck 10/01/2023, 9:33 PM

## 2023-10-01 NOTE — Plan of Care (Signed)

## 2023-10-01 NOTE — Consult Note (Signed)
 NAME:  Corey [name], MRN:  [id number], DOB:  31-Oct-1970, LOS: 3 ADMISSION DATE:  09/28/2023, CONSULTATION DATE:  10/01/23 REFERRING MD:  Dr. Madelyne, CHIEF COMPLAINT:  AMS/ hypoxia   History of Present Illness:  63 yoM with PMH significant for chronic hypoxic resp failure on 4L [place], ESRD, calciphylaxis, PAF, chronic hypotension on midodrine , PAD with previous left AKA, aortic dissection s/p repair 2004, and blindness who presented with worsening RLE wound for several weeks, admitted to [facility] 6/26 with cellulitis with presumed osteomyelitis of 1st through 4th digits, also noted to inguinal and buttocks area.  Seen by vascular, ID, nephrology, and pending PMT consult.  Overnight became more encephalopathic, noted on VBG to be hypercarbic with increased rales.  Pt refused BiPAP and surprisingly repeat VBG improved along with mental status transiently.  CXR c/w pulmonary edema.  Cefepime  was also changed to meropenem given concern for neurotoxicity.  Pt repeated became lethargic with desaturations now requiring 9L with worsening hypotension. RRT initiated. Also noted that rhythm had changed from afib/flutter to [facility], started on amiodarone .  PCCM consulted for further recs.   Pertinent  Medical History   Past Medical History:  Diagnosis Date   Arthritis    hands and shoulders   Blindness and low vision    Stargardt disease   Dissection of aorta (HCC) 2004   a. s/p extensive repeair in 2004 in [place] complicated by ESRD, lower extremity paralysis, coma, and extended hospitalization of 2 years   ESRD (end stage renal disease) (HCC)    a. TTS   Headache    History of cardioversion 2014   Hypertension    Neuropathy    Non-healing non-surgical wound 03/2016   PAF (paroxysmal atrial fibrillation) (HCC)    a. s/p DCCV in 2014; b. on Coumadin ; c. CHADS2VASc => 2 (HTN, vascular disease)   Paralysis (HCC)    due to dissection of aorta in 2004, lower extremities   Pneumonia    Significant Hospital  Events: Including procedures, antibiotic start and stop dates in addition to other pertinent events     Interim History / Subjective:   Objective    Blood pressure 107/69, pulse (!) 51, temperature 98.5 F (36.9 C), temperature source Oral, resp. rate (!) 27, height 6' 3 (1.905 m), weight 61.4 kg, SpO2 95%.    Vent Mode: BIPAP;PCV FiO2 (%):  [4 %-100 %] 100 % Set Rate:  [18 bmp] 18 bmp PEEP:  [6 cmH20] 6 cmH20   Intake/Output Summary (Last 24 hours) at 10/01/2023 1106 Last data filed at 10/01/2023 0745 Gross per 24 hour  Intake 505.5 ml  Output --  Net 505.5 ml   Filed Weights   09/28/23 1331 09/29/23 0844 10/01/23 0431  Weight: 64 kg 64.7 kg 61.4 kg   Examination: General:  AoC ill appearing cachetic male in bed, agitated on bipap, trying to pull off HEENT: pupils 3/r, some JVD Neuro: Awake, MAE, intermittently will f/c but more agitated  CV: wide complex appearing tachycardia in 130s, R TDC PULM:  tachypnea, increased WOB, fine scattered rales > on BiPAP, currently getting around 300-400 volumes GI: soft, +bs, NT Extremities: warm/dry, LITTIE COLT, RLE Unna   Resolved problem list   Assessment and Plan   Acute on chronic hypoxic and hypercarbic respiratory failure in setting of pulmonary edema - tx to ICU, airway watch, currently protecting airway - BiPAP, precedex prn to tolerate vs intubation - NPO - volume removal with planned emergent dialysis - goal sat > 88%, caution  with high peep with RV failure - prn CXR/ VBG   Acute metabolic encephalopathy - airway protection as above - serial neuro exams, currently oriented to person/ place and follows commands - cefepime  changed to meropenem for possible neurotoxicity  - supportive care/ delirium precautions    Chronic hypotension on midodrine   - peripheral NE vasopressor support prn, MAP goal > 60, consider 55 given ESRD WITH ok mental status  - consider stress dose steroids if pressors started, cortisol ordered  -  HD has been limited at times due to hypotension - cont midodrine  as resp/ mental status allows - previous echo 8/24 with EF 50-55%, with severely reduced RV function   Afib/ flutter WCT - briefly in WCT and converted back to aflutter after respiratory support and oxygenation - cont amio - optimize electrolytes  - renal dosed eliquis > may need to change if unable to take PO   ESRD on HD - per Nephrology - plans for iHD with prn levophed, may need CRRT vs ongoing goals of care has BP has limited HD   Cellulitis of RLE, calciphylaxis, and presumed osteomyelitis 1st thru 4th digits in setting of severe PAD - s/p vascular consult> unna boots with abx treatment and then will reassess for possible AKA, not a candidate for revascularization  - abx per ID> vanc/ meropenem (6weeks duration), flagyl for 7 days - BCxs remain ngtd x 3 days, afebrile - WOC/ maximize nutrition as able  - caution w/ sedating meds, tylenol  prn    Debility  Malnutrition  Blindness - will need cortrak vs ongoing care - add thiamine, pending RD consult   Anemia of chronic disease  Chronic thrombocytopenia  - trend CBC  GOC - pending PMT consult, remains full code currently   Best Practice (right click and Reselect all SmartList Selections daily)   Diet/type: NPO DVT prophylaxis other Pressure ulcer(s): pressure ulcer assessment deferred  GI prophylaxis: N/A Lines: Dialysis Catheter Foley:  N/A Code Status:  full code Last date of multidisciplinary goals of care discussion [pending] No family at bedside   Labs   CBC: Recent Labs  Lab 09/28/23 1402 09/29/23 0349 10/01/23 0126  WBC 7.3 8.8 13.0*  NEUTROABS 6.1  --   --   HGB 11.1* 11.7* 12.0*  HCT 38.7* 42.0 41.6  MCV 96.8 98.6 96.7  PLT 127* 105* 118*    Basic Metabolic Panel: Recent Labs  Lab 09/28/23 1402 09/29/23 0349 09/30/23 0453 10/01/23 0126 10/01/23 0456  NA 144 143 138 136 135  K 4.6 5.3* 4.6 4.8 4.7  CL 100 101 96*  95* 94*  CO2 25 17* 23 21* 23  GLUCOSE 95 83 65* 98 147*  BUN 67* 70* 43* 56* 58*  CREATININE 10.36* 10.17* 6.87* 7.65* 7.84*  CALCIUM  9.0 9.1 9.5 9.1 8.7*  PHOS  --  9.7* 8.3*  --  8.6*   GFR: Estimated Creatinine Clearance: 9.5 mL/min (A) (by C-G formula based on SCr of 7.84 mg/dL (H)). Recent Labs  Lab 09/28/23 1402 09/28/23 1407 09/28/23 1611 09/29/23 0349 10/01/23 0126  WBC 7.3  --   --  8.8 13.0*  LATICACIDVEN  --  3.2* 3.0*  --   --     Liver Function Tests: Recent Labs  Lab 09/28/23 1402 09/29/23 0349 09/30/23 0453 10/01/23 0456  AST 14*  --   --   --   ALT 9  --   --   --   ALKPHOS 230*  --   --   --  BILITOT 1.0  --   --   --   PROT 7.2  --   --   --   ALBUMIN  3.3* 3.0* 3.2* 2.9*   No results for input(s): LIPASE, AMYLASE in the last 168 hours. No results for input(s): AMMONIA in the last 168 hours.  ABG    Component Value Date/Time   PHART 7.23 (L) 10/01/2023 0450   PCO2ART 52 (H) 10/01/2023 0450   PO2ART 71 (L) 10/01/2023 0450   HCO3 21.7 10/01/2023 0450   TCO2 27 11/17/2022 0921   ACIDBASEDEF 6.5 (H) 10/01/2023 0450   O2SAT 93.5 10/01/2023 0450     Coagulation Profile: Recent Labs  Lab 09/28/23 1402 09/29/23 0349  INR 1.6* 4.2*    Cardiac Enzymes: No results for input(s): CKTOTAL, CKMB, CKMBINDEX, TROPONINI in the last 168 hours.  HbA1C: No results found for: HGBA1C  CBG: Recent Labs  Lab 10/01/23 0439 10/01/23 0559 10/01/23 0742 10/01/23 0806 10/01/23 1005  GLUCAP 67* 84 50* 70 112*    Review of Systems:   Unable   Past Medical History:  He,  has a past medical history of Arthritis, Blindness and low vision, Dissection of aorta (HCC) (2004), ESRD (end stage renal disease) (HCC), Headache, History of cardioversion (2014), Hypertension, Neuropathy, Non-healing non-surgical wound (03/2016), PAF (paroxysmal atrial fibrillation) (HCC), Paralysis (HCC), and Pneumonia.   Surgical History:   Past Surgical  History:  Procedure Laterality Date   AMPUTATION Left 11/17/2022   Procedure: AMPUTATION ABOVE KNEE;  Surgeon: Serene Gaile ORN, MD;  Location: [facility] OR;  Service: Vascular;  Laterality: Left;   APPLICATION OF WOUND VAC Left 04/13/2016   Procedure: APPLICATION OF WOUND VAC;  Surgeon: Redell LITTIE Door, MD;  Location: [facility] OR;  Service: Vascular;  Laterality: Left;   APPLICATION OF WOUND VAC Left 04/18/2016   Procedure: APPLICATION OF WOUND VAC;  Surgeon: Penne Lonni Colorado, MD;  Location: [facility] OR;  Service: Vascular;  Laterality: Left;  Wound vac change    APPLICATION OF WOUND VAC Left 04/20/2016   Procedure: WOUND VAC CHANGE;  Surgeon: Redell LITTIE Door, MD;  Location: [facility] OR;  Service: Vascular;  Laterality: Left;   AV FISTULA PLACEMENT     BASCILIC VEIN TRANSPOSITION Left 12/09/2015   Procedure: FIRST STAGE BASILIC VEIN TRANSPOSITION LEFT UPPER ARM;  Surgeon: Redell LITTIE Door, MD;  Location: [facility] OR;  Service: Vascular;  Laterality: Left;   BASCILIC VEIN TRANSPOSITION Left 03/09/2016   Procedure: SECOND STAGE BASILIC VEIN TRANSPOSITION WITH REVISION OF ANASTOMOSIS LEFT UPPER ARM;  Surgeon: Redell LITTIE Door, MD;  Location[facility] OR;  Service: Vascular;  Laterality: Left;   CARDIOVERSION     CARDIOVERSION N/A 01/04/2019   Procedure: CARDIOVERSION;  Surgeon: Pietro Redell RAMAN, MD;  Location[facility] ENDOSCOPY;  Service: Cardiovascular;  Laterality: N/A;   CHEST TUBE INSERTION Left 11/02/2022   Procedure: CHEST TUBE INSERTION;  Surgeon: Arlinda Ranks, MD;  Location: [facility] ENDOSCOPY;  Service: Pulmonary;  Laterality: Left;  Please have both Pig[place] (COOK) and chest tube tray available   REPAIR OF ACUTE ASCENDING THORACIC AORTIC DISSECTION     REVISON OF ARTERIOVENOUS FISTULA Left 04/20/2016   Procedure: LIGATION OF BASILIC VEIN TRANSPOSITION;  Surgeon: Redell LITTIE Door, MD;  Location: [facility] OR;  Service: Vascular;  Laterality: Left;   WOUND DEBRIDEMENT Left 04/13/2016   Procedure: DEBRIDEMENT WOUND;  Surgeon: Redell LITTIE Door, MD;  Location: [facility]  OR;  Service: Vascular;  Laterality: Left;     Social History:   reports that he has  never smoked. He has never used smokeless tobacco. He reports that he does not drink alcohol and does not use drugs.   Family History:  His family history includes Cancer in his father and mother; Hypertension in his father and mother; Stroke in his brother; Thyroid  disease in his sister.   Allergies Allergies  Allergen Reactions   Ciprofloxacin Other (See Comments)    Aortic dissection   Heparin  Other (See Comments)    UNSPECIFIED REACTION :  On Coumadin  since 2004   HIT panel negative 01/19/17   Doxercalciferol  Other (See Comments)   Tomato    Quinolones Other (See Comments)    Unknown reaction     Home Medications  Prior to Admission medications   Medication Sig Start Date End Date Taking? Authorizing Provider  sevelamer  carbonate (RENVELA ) 2.4 g PACK Take 4.8 g by mouth 3 (three) times daily with meals.   Yes [provider]  acetaminophen  (TYLENOL ) 325 MG tablet Take 2 tablets (650 mg total) by mouth 4 (four) times daily -  with meals and at bedtime. 12/01/22   Love, Sharlet RAMAN, PA-C  amiodarone  (PACERONE ) 200 MG tablet Take 1 tablet (200 mg total) by mouth daily. 01/20/23   Jeffrie Oneil BROCKS, MD  apixaban  (ELIQUIS ) 2.5 MG TABS tablet Take 1 tablet (2.5 mg total) by mouth 2 (two) times daily. 02/20/23   Jeffrie Oneil BROCKS, MD  bacitracin  ointment Apply topically 2 (two) times daily. 12/13/22   Love, Sharlet RAMAN, PA-C  camphor-menthol  [facility]) lotion Apply 1 Application topically every 8 (eight) hours as needed for itching. 11/29/22   Jillian Buttery, MD  cinacalcet  (SENSIPAR ) 30 MG tablet Take 1 tablet (30 mg total) by mouth daily with supper. 12/13/22   Love, Sharlet RAMAN, PA-C  diclofenac  Sodium (VOLTAREN ) 1 % GEL Apply 2 g topically 4 (four) times daily as needed (Arthritis). 06/07/23   Newlin, Enobong, MD  docusate sodium  (COLACE) 100 MG capsule Take 1 capsule (100 mg total) by mouth daily. 12/14/22    Love, Sharlet RAMAN, PA-C  DULoxetine  (CYMBALTA ) 20 MG capsule Take 1 capsule (20 mg total) by mouth daily. 06/07/23   Newlin, Enobong, MD  gabapentin  (NEURONTIN ) 300 MG capsule Take 1 capsule (300 mg total) by mouth at bedtime. 06/07/23   Newlin, Enobong, MD  hydrocerin (EUCERIN) CREA Apply 1 Application topically daily. 11/29/22   Jillian Buttery, MD  lidocaine  (LIDODERM ) 5 % Place 3 patches onto the skin daily. Remove & Discard patch within 12 hours or as directed by MD 12/13/22   Love, Sharlet RAMAN, PA-C  linezolid (ZYVOX) 600 MG tablet SMARTSIG:1 Tablet(s) By Mouth Every 12 Hours 09/22/23   [provider]  LOKELMA 10 g PACK packet Take 1 packet by mouth. 09/21/23   [provider]  melatonin 3 MG TABS tablet Take 1 tablet (3 mg total) by mouth at bedtime. 12/13/22   Love, Sharlet RAMAN, PA-C  methocarbamol  (ROBAXIN ) 500 MG tablet Take 1 tablet (500 mg total) by mouth every 6 (six) hours as needed for muscle spasms. 12/13/22   Love, Sharlet RAMAN, PA-C  midodrine  (PROAMATINE ) 5 MG tablet Take 3 tablets (15 mg total) by mouth 3 (three) times daily with meals. 01/20/23   Jeffrie Oneil BROCKS, MD  Multiple Vitamins-Minerals (MULTIVITAMIN WITH MINERALS) tablet Take 1 tablet by mouth daily.    [provider]  oxyCODONE -acetaminophen  (PERCOCET) 5-325 MG tablet Take 1 tablet by mouth every 6 (six) hours as needed for severe pain (pain score 7-10). 03/07/23 03/06/24  Rhyne,  Samantha J, PA-C  oxymetazoline  (AFRIN) 0.05 % nasal spray Place 1 spray into both nostrils 2 (two) times daily. 12/14/22   Love, Sharlet RAMAN, PA-C  polyethylene glycol powder (GLYCOLAX /MIRALAX ) 17 GM/SCOOP powder Take 1 capful (17 g) with water  by mouth 2 (two) times daily. 12/13/22   Love, Sharlet RAMAN, PA-C  senna-docusate (SENOKOT-S) 8.6-50 MG tablet Take 1 tablet by mouth 2 (two) times daily. 12/13/22   Love, Sharlet RAMAN, PA-C  zolpidem  (AMBIEN ) 5 MG tablet Take 1 tablet (5 mg total) by mouth at bedtime as needed for sleep (Insomnia). 12/13/22   Maurice Sharlet RAMAN, PA-C     Critical care time: 55 mins       Lyle Pesa, MSN, AG-ACNP-BC [facility] 10/01/2023, 11:07 AM  See Amion for pager If no response to pager , please call [phone] until 7pm After 7:00 pm call Elink  [phone]

## 2023-10-01 NOTE — Significant Event (Addendum)
 Rapid Response Event Note   Reason for Call :  Desaturation/lethargy  Initial Focused Assessment:  Older than age, ill-appearing male lying in bed, minimally interactive. Answers some questions, A&Ox2, unwilling? Vs unable to follow commands. Skin warm and dry, left AKA noted. Some rales heard bilaterally.   80/51 (59) HR 134  RR 25-35 O2 75-90% NRB   Interventions/Plan of Care:  Attempted NRB, NRB + 6L, Bipap; pt pulling off devices EKG already done d/t recent rhythm change PIV placed, labs drawn from site Amio bolus followed by gtt CCM consult, to bedside  Tx 2H13, start levo  Plan for HD at bedside  Event Summary:  MD Notified: Madelyne MD, Harold MD, B. Simpson PA Call Time: 1038 Arrival Time: 1044 End Time: 1145  Tonna Chiquita POUR, RN

## 2023-10-01 NOTE — Progress Notes (Signed)
 Pt removed bipap mask, RN explained risk and benefits to pt. Dr. Shona DO notified, RT aware

## 2023-10-01 NOTE — Progress Notes (Signed)
 PT transported pt from 6E to 2H13 on BiPAP without complications

## 2023-10-01 NOTE — Progress Notes (Signed)
 PROGRESS NOTE    Corey [name]  FMW:[id number] DOB: 04-29-1970 DOA: 09/28/2023 PCP: Bari Morgans, NP   Brief Narrative: 53 year old with Past medical history significant for ESRD on hemodialysis, calciphylaxis, paroxysmal A-fib on midodrine , type IB aortic dissection, peripheral artery disease, chronic lower extremity edema, blindness, nonambulatory, on chronic 4 L of oxygen , presented with worsening right leg wound over the last couple of weeks.  The wound has been leaking and Significant Pain.  Evaluation in the ED he was noted to be afebrile, lactic acid 3.2, x-ray right leg:  large dorsal defect to be due to wound. , limited assessment of osseous structures due to osteopenia, and bony reabsorption/destructive changes involving the 1st through 4th digits concerning for osteomyelitis.   Patient admitted for further care lower extremity wound, infection.    Assessment & Plan:   Principal Problem:   Leg wound, right Active Problems:   Calciphylaxis   Gangrene (HCC)   Cellulitis   Lactic acidosis   End-stage renal disease on hemodialysis (HCC)   Chronic respiratory failure with hypoxia (HCC)   Long term (current) use of anticoagulants [Z79.01]   Atrial fibrillation, chronic (HCC)   Dissection of thoracic aorta (HCC)   S/P AKA (above knee amputation), left (HCC)   Debility   Legally blind   Underweight   Osteomyelitis (HCC)  1-Right LE wound, infection, mixed Venous and vascular ulceration.  Calciphylaxis Presume Osteomyelitis 1 thorough 4 digits  -Patient presented with worsening wound of the right lower extremity related to calciphylaxis - X-ray right lower extremity concern for possible osteomyelitis - Vascular has been consulted - Continue IV vancomycin  and cefepime  -CRP 2.1, ESR 13. - Patient was evaluated by vascular Dr. Magda, who recommended trial of Unna boot therapy to induce healing of the venous ulcer, if unsuccessful after several weeks, he should consider  above-knee amputation. He is not a candidate for revascularization.  -ID consulted for guidance of antibiotics. Recommend 6 weeks IV vancomycin  cefepime  with HD. Flagyl for 7 days.   Acute Hypoxic, Hypercapnic Respiratory failure:  Acute Metabolic Encephalopathy:  -Patient became more lethargic earlier this morning, ABG PH 7.1---subsequently PH increased to 7.2. he was more alert.  -This am, on evaluation he would open eyes, would say few words, would answer few questions.  -Patient Subsequently became more sleepy, BP drop to 80, Oxygen  sat drop to 70. He was placed on NB mask, he had some EKG changes wide complex rhythm. He denies chest pain initially. IV amiodarone  order. IV bolus order initially while awaiting IV amiodarone . Subsequently fluid stop. CCM consulted emergently at bedside for concern of impending intubation.  Nephrology contacted for emergent HD.  Patient was transfer to 2 H.  Brother contacted and updated.    Cellulitis, Inguinal and Buttocks area:  -CT Pelvis: Skin thickening in the inferior medial left buttocks extending to the level of the anus compatible with cellulitis. No enhancing fluid collection or soft tissue gas. Nonspecific subcutaneous edema in the inferior mid buttocks bilaterally without fluid collection. CT scan negative for abscess.  Continue with IV antibiotics.   Lactic acidosis: -In setting infection Continue with IV antibiotics.   Thrombocytopenia:  In setting of infection. Monitor.   ESRD on hemodialysis Hyperkalemia - Nephrology  consulted. Appreciate HD assistance.   Chronic hypoxic respiratory failure On 4 L of oxygen  at home Chest x-ray noted cardiomegaly with diffuse interstitial opacity concerning for edema atelectasis or scaring at the bases. Stable. Volume manage with HD  Chronic hypotension: Continue with midodrine   A-fib a flutter on chronic anticoagulation - Continue amiodarone  - Heparin  on allergy list - Argatroban  per  pharmacy  Type B thoracic aortic dissection with aneurysmal dilation of the proximal descending thoracic aorta  S/P Left AKA  Debility, legally blind  Underweight Start Breeze, nutrition consult.   Goals of Care:  Patient deconditioned, multiples medical problems. Multiples consultant recommend palliative care evaluation.  Discussed with patient, he agrees to speak with palliative  Estimated body mass index is 16.92 kg/m as calculated from the following:   Height as of this encounter: 6' 3 (1.905 m).   Weight as of this encounter: 61.4 kg.   DVT prophylaxis: Argatroban  Code Status: Full code Family Communication: care discussed with patient.  Disposition Plan:  Status is: Inpatient Remains inpatient appropriate because: management of Left LE wound    Consultants:  Vascular Nephrology   Procedures:    Antimicrobials:    Subjective: Open eyes to voice, say few words. Goes back to sleep.  Subsequently page by PT, Nurse patient with worsening hypoxia. Placed on 9 L oxygen .  Came to bedside. He was awake, he report pain all over. He said  I am here, he had Rhythm change, Wide complex rhythm.   Objective: Vitals:   10/01/23 0239 10/01/23 0338 10/01/23 0431 10/01/23 0743  BP:  103/68 101/60 (!) 98/57  Pulse: 78 65 83 77  Resp: (!) 22 14  16   Temp:  98.2 F (36.8 C) 97.7 F (36.5 C) 98.5 F (36.9 C)  TempSrc:   Oral Oral  SpO2: 98% (!) 68% 100% 98%  Weight:   61.4 kg   Height:        Intake/Output Summary (Last 24 hours) at 10/01/2023 0840 Last data filed at 10/01/2023 0617 Gross per 24 hour  Intake 524.34 ml  Output --  Net 524.34 ml   Filed Weights   09/28/23 1331 09/29/23 0844 10/01/23 0431  Weight: 64 kg 64.7 kg 61.4 kg    Examination:  General exam: chronic ill appearing, sleepy Respiratory system: BL air movement.  Cardiovascular system:S 1, S 2 RRR Gastrointestinal system: BS present, soft, nt Extremities: left AKA. Right leg with unna  boot.    Data Reviewed: I have personally reviewed following labs and imaging studies  CBC: Recent Labs  Lab 09/28/23 1402 09/29/23 0349 10/01/23 0126  WBC 7.3 8.8 13.0*  NEUTROABS 6.1  --   --   HGB 11.1* 11.7* 12.0*  HCT 38.7* 42.0 41.6  MCV 96.8 98.6 96.7  PLT 127* 105* 118*   Basic Metabolic Panel: Recent Labs  Lab 09/28/23 1402 09/29/23 0349 09/30/23 0453 10/01/23 0126 10/01/23 0456  NA 144 143 138 136 135  K 4.6 5.3* 4.6 4.8 4.7  CL 100 101 96* 95* 94*  CO2 25 17* 23 21* 23  GLUCOSE 95 83 65* 98 147*  BUN 67* 70* 43* 56* 58*  CREATININE 10.36* 10.17* 6.87* 7.65* 7.84*  CALCIUM  9.0 9.1 9.5 9.1 8.7*  PHOS  --  9.7* 8.3*  --  8.6*   GFR: Estimated Creatinine Clearance: 9.5 mL/min (A) (by C-G formula based on SCr of 7.84 mg/dL (H)). Liver Function Tests: Recent Labs  Lab 09/28/23 1402 09/29/23 0349 09/30/23 0453 10/01/23 0456  AST 14*  --   --   --   ALT 9  --   --   --   ALKPHOS 230*  --   --   --   BILITOT 1.0  --   --   --  PROT 7.2  --   --   --   ALBUMIN  3.3* 3.0* 3.2* 2.9*   No results for input(s): LIPASE, AMYLASE in the last 168 hours. No results for input(s): AMMONIA in the last 168 hours. Coagulation Profile: Recent Labs  Lab 09/28/23 1402 09/29/23 0349  INR 1.6* 4.2*   Cardiac Enzymes: No results for input(s): CKTOTAL, CKMB, CKMBINDEX, TROPONINI in the last 168 hours. BNP (last 3 results) No results for input(s): PROBNP in the last 8760 hours. HbA1C: No results for input(s): HGBA1C in the last 72 hours. CBG: Recent Labs  Lab 10/01/23 0003 10/01/23 0439 10/01/23 0559 10/01/23 0742 10/01/23 0806  GLUCAP 76 67* 84 50* 70   Lipid Profile: No results for input(s): CHOL, HDL, LDLCALC, TRIG, CHOLHDL, LDLDIRECT in the last 72 hours. Thyroid  Function Tests: Recent Labs    10/01/23 0126  TSH 3.880   Anemia Panel: No results for input(s): VITAMINB12, FOLATE, FERRITIN, TIBC, IRON,  RETICCTPCT in the last 72 hours. Sepsis Labs: Recent Labs  Lab 09/28/23 1407 09/28/23 1611  LATICACIDVEN 3.2* 3.0*    Recent Results (from the past 240 hours)  Blood Culture (routine x 2)     Status: None (Preliminary result)   Collection Time: 09/28/23  2:05 PM   Specimen: BLOOD LEFT ARM  Result Value Ref Range Status   Specimen Description BLOOD LEFT ARM  Final   Special Requests   Final    BOTTLES DRAWN AEROBIC AND ANAEROBIC Blood Culture results may not be optimal due to an inadequate volume of blood received in culture bottles   Culture   Final    NO GROWTH 2 DAYS Performed at [facility], [address], [place], [place] [address]    Report Status PENDING  Incomplete  Blood Culture (routine x 2)     Status: None (Preliminary result)   Collection Time: 09/28/23  2:12 PM   Specimen: BLOOD LEFT FOREARM  Result Value Ref Range Status   Specimen Description BLOOD LEFT FOREARM  Final   Special Requests   Final    BOTTLES DRAWN AEROBIC AND ANAEROBIC Blood Culture adequate volume   Culture   Final    NO GROWTH 2 DAYS Performed at [facility], [address], [place], [place] [address]    Report Status PENDING  Incomplete         Radiology Studies: DG CHEST PORT 1 VIEW Result Date: 10/01/2023 CLINICAL DATA:  53 year old male with altered mental status and abnormal pulmonary auscultation. EXAM: PORTABLE CHEST 1 VIEW COMPARISON:  Portable chest 09/28/2023 and earlier. FINDINGS: Portable AP semi upright view at 0341 hours. Stable cardiomegaly and mediastinal contours. Stable right chest dual lumen vascular catheter. Right axillary vascular stent redemonstrated. Symmetric increased reticulonodular opacity. Chronic but increased streaking curvilinear lower lung and right minor fissure opacity most resembling atelectasis. No pneumothorax. New veiling opacity also at the right lung base, right hemidiaphragm now obscured. Chronic sclerosis throughout the visible spine,  previously thought to be renal osteodystrophy related. Paucity of bowel gas. Diffuse vascular calcified atherosclerosis throughout the chest. IMPRESSION: 1. Symmetrically increased pulmonary interstitial opacity favored to be Acute Interstitial Edema. Viral/atypical respiratory infection felt less likely. 2. Evidence of small new or increased right pleural effusion. Increased bilateral lower lung atelectasis. 3. Underlying chronic cardiomegaly, abnormal aorta, diffuse calcified atherosclerosis, renal osteodystrophy. Electronically Signed   By: VEAR Hurst M.D.   On: 10/01/2023 03:55        Scheduled Meds:  apixaban   2.5  mg Oral BID   Chlorhexidine  Gluconate Cloth  6 each Topical Q0600   Chlorhexidine  Gluconate Cloth  6 each Topical Q0600   feeding supplement  1 Container Oral TID BM   midodrine   15 mg Oral TID WC   multivitamin  1 tablet Oral QHS   sevelamer  carbonate  3,200 mg Oral TID WC   sodium chloride  flush  3 mL Intravenous Q12H   Continuous Infusions:  [START ON 10/02/2023] meropenem (MERREM) IV     vancomycin        LOS: 3 days    Time spent: 35 minutes    Joanne Brander A Sowmya Partridge, MD [facility]   If 7PM-7AM, please contact night-coverage [email or web address]  10/01/2023, 8:40 AM

## 2023-10-01 NOTE — Procedures (Signed)
 HD Note:  Some information was entered later than the data was gathered due to patient care needs. The stated time with the data is accurate.  Patient treatment done at bedside.   Patient not responsive to voice, does not initiate interaction,  eyes are closed.  Informed consent signed and in chart.   Access used: Upper right chest tunneled HD catheter Access issues: None  Albumin  given at the beginning of treatment, see MAR.  BP support used during treatment, see MAR  Informed during report that the O2 monitoring has not been picking up readings.  Sporadic readings began about 45 min into treatment.  TX duration:  4 hours and 10 min.  Time was added to treatment to accommodate administration of antibiotic, see MAR   Total UF removed: 3000 ml  Hand-off given to patient's nurse.    Firman Petrow L. Lenon, RN [facility].

## 2023-10-01 NOTE — Progress Notes (Signed)
 RT NOTE:  0230: Pt placed on BIPAP per MD order. Pt repeatedly pulling mask off. Pt states he can not breathe. Pt is getting appropriate volumes and pressure when he relaxes and leaves mask alone. RN made aware that patient is constantly pulling at mask.   2: RN called to report patient had pulled off mask again. RT advised RN to contact doctor.

## 2023-10-01 NOTE — Consult Note (Signed)
 Palliative Care Consult Note                                  Date: 10/01/2023   Patient Name: Corey [name]  DOB: 1970-06-25  MRN: [id number]  Age / Sex: 53 y.o., male  PCP: Bari Morgans, NP Referring Physician: Madelyne Owen DELENA, MD  Reason for Consultation: Establishing goals of care  HPI/Patient Profile: 53 y.o. male  with past medical history of ESRD on hemodialysis, calciphylaxis, paroxysmal A-fib on midodrine , type IB aortic dissection, peripheral artery disease, chronic lower extremity edema, blindness, nonambulatory, on chronic 4 L of oxygen  admitted on 09/28/2023 with worsening right leg wound over the last couple of weeks .   Evaluation in the ED he was noted to be afebrile, lactic acid 3.2, x-ray right leg: large dorsal defect to be due to wound, limited assessment of osseous structures due to osteopenia, and bony reabsorption/destructive changes involving the 1st through 4th digits concerning for osteomyelitis.     Past Medical History:  Diagnosis Date   Arthritis    hands and shoulders   Blindness and low vision    Stargardt disease   Dissection of aorta (HCC) 2004   a. s/p extensive repeair in 2004 in [place] complicated by ESRD, lower extremity paralysis, coma, and extended hospitalization of 2 years   ESRD (end stage renal disease) (HCC)    a. TTS   Headache    History of cardioversion 2014   Hypertension    Neuropathy    Non-healing non-surgical wound 03/2016   PAF (paroxysmal atrial fibrillation) (HCC)    a. s/p DCCV in 2014; b. on Coumadin ; c. CHADS2VASc => 2 (HTN, vascular disease)   Paralysis (HCC)    due to dissection of aorta in 2004, lower extremities   Pneumonia     Subjective:   I have reviewed medical records including EPIC notes, labs and imaging, received updates from nursing and attending physician.   Today's Discussion: Assessed the patient. Patient was very lethargic. He kept closing his eyes  and appearing to go back to sleep. He was unable to answer general questions or questions about goals of care. During a previous admission he had shared with PMT that he would want his brother Corey [name] to be his proxy decision maker. I asked Corey [name] if I could call his brother to discuss his care and he responded yes.  Called patient's brother Corey [name]. I introduced Palliative Medicine as specialized medical care for people living with serious illness. It focuses on providing relief from symptoms and stress of a serious illness. The goal is to improve quality of life for both the patient and the family. Corey [name] lives with the patient and took him to the hospital for his leg to be evaluated. The patient had felt so bad recently that he missed two dialysis days last week. Corey [name] has been worried because the patient has not answered the phone since yesterday. He has a good general understanding of the patient's chronic conditions.   Updated Corey [name] on the patient's current hospitalization including his likely osteomyelitis, increasing lethargy, and worsening respiratory status. I shared my concern that  the patient's clinical status was worsening. I ask him if he can come in to discuss diagnosis prognosis, GOC, EOL wishes, disposition and options. I shared that it is important for us  to discuss what interventions Mia may or may not want such as intubation.  I shared that during his last PMT meeting in September the patient wanted to pursue aggressive treatments to extend his life. I shared that I wondered if Tharun would make those same decisions at this time considering his current health status. Corey [name] has limited transportation but he will try to come in today to discuss-- he will call PMT with a time to meet for goals of care discussion.  3:00 pm: Met with patient's brother Corey [name] and nephew to discuss goals of care. Patient is now in 2H on BiPAP.   Corey [name] shared the patient is originally from [place] .  He lives with Corey [name] and Corey [name]'s son. He enjoys being at home and being on social media. He has had a decline in function since his amputation last year and he is not able to get out of the house much except for medical appointments. Corey [name] shared the patient has two daughters one in [place]  and one in [place] . We discussed that the patient is currently unable to make his own healthcare decisions. Corey [name] shared the patient has not created a HCPOA document. I shared that without a HCPOA document the patient's children would be proxy decision makers- their information is not in the chart. Corey [name] would not share their contact information but says he has already reached out to them. I encouraged Corey [name] set up a time where the family including his daughters could talk about goals of care- he agrees to do this. Corey [name] shared that he believes the patient would want an attempted resuscitation and aggressive treatment including intubation if it helped lengthen his life. (This is consistent with a previous PMT discussion with the patient in September 2024).  Questions and concerns were addressed.  Emotional support and therapeutic listening provided.  The family was encouraged to call with questions or concerns. PMT will continue to support holistically.  Review of Systems  Unable to perform ROS   Objective:   Primary Diagnoses: Present on Admission:  Leg wound, right  Gangrene (HCC)  Lactic acidosis  Calciphylaxis  Dissection of thoracic aorta (HCC)  Atrial fibrillation, chronic (HCC)  Debility  Chronic respiratory failure with hypoxia (HCC)  Legally blind  Underweight  Cellulitis   Physical Exam Vitals reviewed.  Constitutional:      General: He is not in acute distress.    Appearance: He is ill-appearing.     Interventions: Nasal cannula in place.  HENT:     Head: Normocephalic and atraumatic.   Cardiovascular:     Rate and Rhythm: Bradycardia present.  Pulmonary:     Effort:  Tachypnea present.   Neurological:     Mental Status: He is lethargic.     Comments: Unable to assess orientation    Vital Signs:  BP (!) 98/57 (BP Location: Left Arm)   Pulse (!) 51   Temp 98.5 F (36.9 C) (Oral)   Resp (!) 31   Ht 6' 3 (1.905 m)   Wt 61.4 kg   SpO2 96%   BMI 16.92 kg/m     Advanced Care Planning:   Existing Vynca/ACP Documentation: None  Primary Decision Maker: PATIENT  Code Status/Advance Care Planning: Full code   Assessment & Plan:   SUMMARY OF RECOMMENDATIONS   Full code Full  scope Further GOC discussion Locate daughters if patient's mental status does not improve Continued PMT support   Discussed with: Dr. Regalado and bedside RN  Time Total: 75 minutes    Thank you for allowing us  to participate in the care of Oneil DELENA [name] PMT will continue to support holistically.   Signed by: Stephane Palin, NP Palliative Medicine Team  Team Phone # [phone] (Nights/Weekends)  10/01/2023, 10:12 AM

## 2023-10-01 NOTE — Progress Notes (Addendum)
 [facility] Progress Note  Subjective:  Pt deteriorated this morning w/ worsening SOB Pt placed on bipap and moved to ICU  Vitals:   10/01/23 0930 10/01/23 1049 10/01/23 1129 10/01/23 1132  BP:    (!) 68/49  Pulse:      Resp:   (!) 23 19  Temp:      TempSrc:      SpO2: 100% 95%    Weight:      Height:        Exam: Gen alert, no distress, 4L [place] O2 No jvd or bruits Chest clear bilat to bases RRR no MRG Abd soft ntnd no mass or ascites +bs Ext L AKA, RLE w/ diffuse skin inflammation/ discoloration Neuro is alert, Ox 3 , nf    RIJ TDC intact    CXR 6/26 - IMPRESSION: Cardiomegaly with diffuse increased interstitial and ground-glass opacity probably representing edema. Atelectasis and or scarring at the bases    OP HD: TTS [facility] From sept 2024-> 4h  66kg TDC  No heparin  (allergic)  lock TDC w/ citrate       Assessment/ Plan: R lower extremity wounds: per pmd, starting IV abx vanc/ cefepime .  ESRD: on HD TTS. Had HD 6/27 here. Plan HD tonight or possibly tomorrow.   BP/ chronic hypotension: on midodrine  15 tid. Usual BP's are in the upper 80s at OP unit.  Volume: bilat rales, initial CXR w/ vasc congestion, question early edema. Is on 4 L [place] at home. Resp status declined this am and is now on bipap, repeat CXR looks worse w/ definite pulm edema.  Anemia of esrd: Hb 11, follow FTT: recommend GOC discussions for this patient. He has chronic hypotension which is severe (bp 80's-90's in OP setting on HD) and will continue to limit our ability to get fluid off with dialysis.       Myer Fret MD  [facility] 10/01/2023, 11:37 AM  Recent Labs  Lab 09/29/23 0349 09/30/23 0453 10/01/23 0126 10/01/23 0456  HGB 11.7*  --  12.0*  --   ALBUMIN  3.0* 3.2*  --  2.9*  CALCIUM  9.1 9.5 9.1 8.7*  PHOS 9.7* 8.3*  --  8.6*  CREATININE 10.17* 6.87* 7.65* 7.84*  K 5.3* 4.6 4.8 4.7   No results for input(s): IRON, TIBC, FERRITIN in the last 168 hours. Inpatient  medications:  apixaban   2.5 mg Oral BID   Chlorhexidine  Gluconate Cloth  6 each Topical Q0600   Chlorhexidine  Gluconate Cloth  6 each Topical Q0600   feeding supplement  1 Container Oral TID BM   midodrine   15 mg Oral TID WC   multivitamin  1 tablet Oral QHS   sevelamer  carbonate  3,200 mg Oral TID WC   sodium chloride  flush  3 mL Intravenous Q12H    sodium chloride      amiodarone      amiodarone      dexmedetomidine (PRECEDEX) IV infusion     [START ON 10/02/2023] meropenem (MERREM) IV     norepinephrine (LEVOPHED) Adult infusion     vancomycin      acetaminophen  **OR** acetaminophen , albuterol , amiodarone , dextrose , oxyCODONE -acetaminophen 

## 2023-10-01 NOTE — Progress Notes (Signed)
 OT Cancellation Note  Patient Details Name: Corey [name] MRN: [id number] DOB: [birth year]   Cancelled Treatment:    Reason Eval/Treat Not Completed: Medical issues which prohibited therapy. OT/PT arrived for eval. Pt lethergic, with few vocalizations to answer questions. Destating O2 to 84 while on 6L HFnc, increased to 8L. Hr increasing to 130s. RN notified via secure chat. Will follow up tomorrow.   Corey [name], OT  Acute Rehabilitation Services Office [phone] Secure chat preferred   Corey [name] Savers 10/01/2023, 10:29 AM

## 2023-10-01 NOTE — Progress Notes (Signed)
 [facility] Progress Note  Subjective:  Seen in room Lethargic, not in distress  Vitals:   09/30/23 2341 10/01/23 0239 10/01/23 0338 10/01/23 0431  BP: (!) 112/50  103/68 101/60  Pulse: 76 78 65 83  Resp: 15 (!) 22 14   Temp: (!) 97.5 F (36.4 C)  98.2 F (36.8 C) 97.7 F (36.5 C)  TempSrc:    Oral  SpO2: 96% 98% (!) 68% 100%  Weight:    61.4 kg  Height:        Exam: Gen alert, no distress, 4L [place] O2 No jvd or bruits Chest clear bilat to bases RRR no MRG Abd soft ntnd no mass or ascites +bs Ext L AKA, RLE w/ diffuse skin inflammation/ discoloration Neuro is alert, Ox 3 , nf    RIJ TDC intact    CXR 6/26 - IMPRESSION: Cardiomegaly with diffuse increased interstitial and ground-glass opacity probably representing edema. Atelectasis and or scarring at the bases    OP HD: TTS [facility] From sept 2024-> 4h  66kg TDC  No heparin  (allergic)  lock TDC w/ citrate       Assessment/ Plan: R lower extremity wounds: per pmd, starting IV abx vanc/ cefepime .  ESRD: on HD TTS. Had HD 6/27 here. Plan HD tonight or possibly tomorrow.   BP/ chronic hypotension: on midodrine  15 tid. Usual BP's are in the upper 80s at OP unit.  Volume: lungs clear, initial CXR + IS edema, not symptomatic. 4L [place] here as at home. Cont to lower volume as BP's will tolerate.  Anemia of esrd: Hb 11, follow      Myer Fret MD  [facility] 10/01/2023, 7:39 AM  Recent Labs  Lab 09/29/23 0349 09/30/23 0453 10/01/23 0126 10/01/23 0456  HGB 11.7*  --  12.0*  --   ALBUMIN  3.0* 3.2*  --  2.9*  CALCIUM  9.1 9.5 9.1 8.7*  PHOS 9.7* 8.3*  --  8.6*  CREATININE 10.17* 6.87* 7.65* 7.84*  K 5.3* 4.6 4.8 4.7   No results for input(s): IRON, TIBC, FERRITIN in the last 168 hours. Inpatient medications:  apixaban   2.5 mg Oral BID   Chlorhexidine  Gluconate Cloth  6 each Topical Q0600   Chlorhexidine  Gluconate Cloth  6 each Topical Q0600   feeding supplement  1 Container Oral TID BM   midodrine   15 mg  Oral TID WC   multivitamin  1 tablet Oral QHS   sevelamer  carbonate  3,200 mg Oral TID WC   sodium chloride  flush  3 mL Intravenous Q12H    [START ON 10/02/2023] meropenem (MERREM) IV     vancomycin      acetaminophen  **OR** acetaminophen , albuterol , dextrose , fentaNYL  (SUBLIMAZE ) injection, oxyCODONE -acetaminophen 

## 2023-10-01 NOTE — Progress Notes (Signed)
 Pt was intermittently desating. Around 130 Rhythm change, desat in 70. Continuously lethargic as in am. EKG done, called MD and at the bedside. Called RT, RR. CCM called. Tried to place Bipap but pt refused as before. Labs drawn and sent, 2nd IV placed. CCM at the bedside, decision was made to transfer to ICU for intubation.  Rep[ort was given at the bedside 2H13. Pt belongings with pt

## 2023-10-02 ENCOUNTER — Other Ambulatory Visit (HOSPITAL_COMMUNITY)

## 2023-10-02 ENCOUNTER — Encounter (INDEPENDENT_AMBULATORY_CARE_PROVIDER_SITE_OTHER): Payer: 59

## 2023-10-02 DIAGNOSIS — J9621 Acute and chronic respiratory failure with hypoxia: Secondary | ICD-10-CM | POA: Diagnosis not present

## 2023-10-02 DIAGNOSIS — A419 Sepsis, unspecified organism: Secondary | ICD-10-CM | POA: Diagnosis not present

## 2023-10-02 DIAGNOSIS — Z7189 Other specified counseling: Secondary | ICD-10-CM | POA: Diagnosis not present

## 2023-10-02 DIAGNOSIS — I129 Hypertensive chronic kidney disease with stage 1 through stage 4 chronic kidney disease, or unspecified chronic kidney disease: Secondary | ICD-10-CM | POA: Diagnosis not present

## 2023-10-02 DIAGNOSIS — J9622 Acute and chronic respiratory failure with hypercapnia: Secondary | ICD-10-CM | POA: Diagnosis not present

## 2023-10-02 DIAGNOSIS — I4891 Unspecified atrial fibrillation: Secondary | ICD-10-CM | POA: Diagnosis not present

## 2023-10-02 DIAGNOSIS — Z992 Dependence on renal dialysis: Secondary | ICD-10-CM | POA: Diagnosis not present

## 2023-10-02 DIAGNOSIS — N186 End stage renal disease: Secondary | ICD-10-CM | POA: Diagnosis not present

## 2023-10-02 DIAGNOSIS — Z515 Encounter for palliative care: Secondary | ICD-10-CM | POA: Diagnosis not present

## 2023-10-02 LAB — POCT I-STAT 7, (LYTES, BLD GAS, ICA,H+H)
Acid-base deficit: 2 mmol/L (ref 0.0–2.0)
Bicarbonate: 24.1 mmol/L (ref 20.0–28.0)
Calcium, Ion: 1.21 mmol/L (ref 1.15–1.40)
HCT: 36 % — ABNORMAL LOW (ref 39.0–52.0)
Hemoglobin: 12.2 g/dL — ABNORMAL LOW (ref 13.0–17.0)
O2 Saturation: 98 %
Patient temperature: 36.6
Potassium: 3.5 mmol/L (ref 3.5–5.1)
Sodium: 131 mmol/L — ABNORMAL LOW (ref 135–145)
TCO2: 25 mmol/L (ref 22–32)
pCO2 arterial: 46.3 mmHg (ref 32–48)
pH, Arterial: 7.321 — ABNORMAL LOW (ref 7.35–7.45)
pO2, Arterial: 109 mmHg — ABNORMAL HIGH (ref 83–108)

## 2023-10-02 LAB — GLUCOSE, CAPILLARY
Glucose-Capillary: 104 mg/dL — ABNORMAL HIGH (ref 70–99)
Glucose-Capillary: 126 mg/dL — ABNORMAL HIGH (ref 70–99)
Glucose-Capillary: 42 mg/dL — CL (ref 70–99)
Glucose-Capillary: 44 mg/dL — CL (ref 70–99)
Glucose-Capillary: 45 mg/dL — ABNORMAL LOW (ref 70–99)
Glucose-Capillary: 49 mg/dL — ABNORMAL LOW (ref 70–99)
Glucose-Capillary: 58 mg/dL — ABNORMAL LOW (ref 70–99)
Glucose-Capillary: 61 mg/dL — ABNORMAL LOW (ref 70–99)
Glucose-Capillary: 67 mg/dL — ABNORMAL LOW (ref 70–99)
Glucose-Capillary: 74 mg/dL (ref 70–99)
Glucose-Capillary: 80 mg/dL (ref 70–99)
Glucose-Capillary: 82 mg/dL (ref 70–99)
Glucose-Capillary: 83 mg/dL (ref 70–99)
Glucose-Capillary: 87 mg/dL (ref 70–99)
Glucose-Capillary: 87 mg/dL (ref 70–99)
Glucose-Capillary: 88 mg/dL (ref 70–99)
Glucose-Capillary: 89 mg/dL (ref 70–99)
Glucose-Capillary: 90 mg/dL (ref 70–99)
Glucose-Capillary: 92 mg/dL (ref 70–99)

## 2023-10-02 LAB — MRSA NEXT GEN BY PCR, NASAL: MRSA by PCR Next Gen: NOT DETECTED

## 2023-10-02 MED ORDER — MIDODRINE HCL 5 MG PO TABS
20.0000 mg | ORAL_TABLET | Freq: Once | ORAL | Status: AC
  Administered 2023-10-02: 20 mg via ORAL

## 2023-10-02 MED ORDER — SODIUM CHLORIDE 0.9 % IV SOLN
1.0000 g | INTRAVENOUS | Status: DC
  Administered 2023-10-02 – 2023-10-03 (×2): 1 g via INTRAVENOUS
  Filled 2023-10-02 (×3): qty 1

## 2023-10-02 MED ORDER — GLUCOSE 40 % PO GEL
2.0000 | ORAL | Status: AC
  Filled 2023-10-02: qty 2.42

## 2023-10-02 MED ORDER — MIDODRINE HCL 5 MG PO TABS
20.0000 mg | ORAL_TABLET | Freq: Three times a day (TID) | ORAL | Status: DC
  Administered 2023-10-02 – 2023-10-03 (×5): 20 mg via ORAL
  Filled 2023-10-02 (×5): qty 4

## 2023-10-02 NOTE — Progress Notes (Addendum)
 Palliative Medicine Inpatient Follow Up Note HPI: 53 y.o. male  with past medical history of ESRD on hemodialysis, calciphylaxis, paroxysmal A-fib on midodrine , type IB aortic dissection, peripheral artery disease, chronic lower extremity edema, blindness, nonambulatory, on chronic 4 L of oxygen  admitted on 09/28/2023 with worsening right leg wound over the last couple of weeks   Today's Discussion 10/02/2023  *Please note that this is a verbal dictation therefore any spelling or grammatical errors are due to the Dragon Medical One system interpretation.  Chart reviewed inclusive of vital signs, progress notes, laboratory results, and diagnostic images.   I met with Oneil at bedside this morning though he was in the process of receiving therapy therefore I shared I would return later. __________________________ Addendum:  Informed that family was at bedside and would like to meet with the PMT and CCU team.  I met with Rastus, his brother, Evalene, and his cousin, Shona this afternoon in the company of Dr. Harold.  Dr. Harold provided a medical update. He shares that Berry is on pressor support at this time for unstable blood pressures. He notes that patient has been on iHD for a long time now which the family also acknowledged. He shares that although everything that can he done medically is being and has been pursued. Unfortunately, in the setting of patient ESRD when intolerant of iHD as noted by patients hypotension there is no easy fix and being that this has been recurrent comfort care is recommended as additional dialysis at this juncture would likely not be of benefit to the patient. He shares the reality of fluid re-accumulation now that [facility] cannot tolerate dialysis.   Dr. Harold also shares that addressing the patients RLE from his PAD at this time is not beneficial to operate on nor would this be offered given patient instability.  Dr. Harold shared with patients family that he is at  end of life and we are likely looking at only days of life left.   Brayson is in shock and begins to cry when he hears this. Time was spent at bedside to re-iterate the information.   Patients family share that they need time to digest his condition but plan to reach out once they are ready to discuss a shift to comfort care.  ___________________________________ Addendum:  I went to bedside this late afternoon. We reviewed the concern(s) associated with patients present health and if he were to decompensate what should be done from there in terms of resuscitative efforts. I explained the likely trauma associated with resuscitation and that it would be of little to no benefit in patient present circumstances. He asks that the medical team do everything to give him a chance. I shared this is a very difficult situation indeed and it's the limitations which his body can endure that are worrisome.  Patients brother wants for Geoge to continue to make decisions at this time though I shared it is likely he will be less and less able to speak for himself in the setting of his ESRD and lack of dialysis. Timothy at this time would like to pursue everything he believes his brother would want.   Add time: 43  Questions and concerns addressed/Palliative Support Provided.   Objective Assessment: Vital Signs Vitals:   10/02/23 1100 10/02/23 1201  BP: (!) 79/53   Pulse: 85   Resp: (!) 25   Temp:  97.6 F (36.4 C)  SpO2: 100%     Intake/Output Summary (Last 24 hours) at  10/02/2023 1239 Last data filed at 10/02/2023 0701 Gross per 24 hour  Intake 1583.09 ml  Output 3000 ml  Net -1416.91 ml   Last Weight  Most recent update: 10/01/2023  5:59 AM    Weight  61.4 kg (135 lb 5.8 oz)            Gen:  Middled aged AA M critically ill appearing HEENT: moist mucous membranes CV: Regular rate and rhythm PULM:  On 8LPM , breathing even ABD: soft/nontender  EXT: L AKA, RLE dressing in place Neuro:  Aware of self - intermittently disoriented  SUMMARY OF RECOMMENDATIONS   Full code for the time being - DNR previously recommended  CCM - Dr. Harold has explained to patient and his family that he is critically ill and has a limited life expectancy  Comfort care was strongly recommended - Family share they need time to digest the situation  PMT will continue to support patient and his family during this difficult time ______________________________________________________________________________________ Rosaline Becton [facility] Palliative Medicine Team Team Cell Phone: [phone] Please utilize secure chat with additional questions, if there is no response within 30 minutes please call the above phone number  Time Spent: 50 Billing based on MDM: High  Palliative Medicine Team providers are available by phone from 7am to 7pm daily and can be reached through the team cell phone.  Should this patient require assistance outside of these hours, please call the patient's attending physician.

## 2023-10-02 NOTE — Progress Notes (Signed)
 [facility] Progress Note  Subjective:  Patient remains critically ill in the ICU.  Was able to get through dialysis last night with 3 L removed.  Persistent low blood pressures on low-dose norepinephrine this morning when I saw him.  [place] he is hurting all over.  Having back pain.  Says dialysis went terrible but unable to specify exactly what happened. Questions regarding his goals of care as addressed in IPAL note  Vitals:   10/02/23 0700 10/02/23 0701 10/02/23 0800 10/02/23 0837  BP: (!) 75/52 (!) 77/53 (!) 80/70   Pulse:  87 98   Resp: (!) 24 (!) 22 (!) 25   Temp:    97.9 F (36.6 C)  TempSrc:    Oral  SpO2:  100% 97%   Weight:      Height:        Exam: GEN: Chronically ill-appearing, lying in bed, intermittent pain ENT: no nasal discharge, mmm EYES: no scleral icterus, eomi CV: normal rate, no rub PULM: Bilateral chest rise, mild increased work of breathing ABD: NABS, non-distended SKIN: Lower extremity wound covered, otherwise no rashes or jaundice EXT: no edema, warm and well perfused     CXR 6/26 - IMPRESSION: Cardiomegaly with diffuse increased interstitial and ground-glass opacity probably representing edema. Atelectasis and or scarring at the bases    OP HD: TTS [facility] From sept 2024-> 4h  66kg TDC  No heparin  (allergic)  lock TDC w/ citrate       Assessment/ Plan: R lower extremity wounds: Antibiotics per primary team ESRD: on HD TTS. Had HD 6/27 here.  Urgent dialysis overnight for shortness of breath on 6/29.  Difficulty tolerating dialysis because of hypotension. BP/ chronic hypotension: on midodrine  15 tid. Usual BP's are in the upper 80s at OP unit.  Has intermittently required norepinephrine.  Likely a limiting factor in his care Volume: With pulmonary edema.  Dialysis as tolerated Anemia of esrd: Hb 11, follow FTT: recommend GOC discussions for this patient. He has chronic hypotension which is severe (bp 80's-90's in OP setting on HD) and  will continue to limit our ability to get fluid off with dialysis.  We discussed goals of care on 6/30 per IPAL note.       10/02/2023, 10:33 AM  Recent Labs  Lab 09/30/23 0453 10/01/23 0126 10/01/23 0456 10/01/23 1100 10/02/23 0915  HGB  --    < >  --  11.4* 12.2*  ALBUMIN  3.2*  --  2.9* 2.7*  --   CALCIUM  9.5   < > 8.7* 8.5*  --   PHOS 8.3*  --  8.6*  --   --   CREATININE 6.87*   < > 7.84* 7.53*  --   K 4.6   < > 4.7 4.3 3.5   < > = values in this interval not displayed.   No results for input(s): IRON, TIBC, FERRITIN in the last 168 hours. Inpatient medications:  apixaban   2.5 mg Oral BID   Chlorhexidine  Gluconate Cloth  6 each Topical Q0600   feeding supplement  1 Container Oral TID BM   midodrine   20 mg Oral TID WC   multivitamin  1 tablet Oral QHS   sevelamer  carbonate  3,200 mg Oral TID WC   sodium chloride  flush  3 mL Intravenous Q12H   thiamine (VITAMIN B1) injection  100 mg Intravenous Daily   vancomycin  variable dose per unstable renal function (pharmacist dosing)   Does not apply See admin instructions  sodium chloride      albumin  human Stopped (10/01/23 2110)   amiodarone  60 mg/hr (10/02/23 0905)   anticoagulant sodium citrate      cefTAZidime  (FORTAZ )  IV     norepinephrine (LEVOPHED) Adult infusion 1 mcg/min (10/02/23 0701)   acetaminophen  **OR** acetaminophen , albumin  human, albuterol , alteplase , anticoagulant sodium citrate , dextrose , oxyCODONE -acetaminophen 

## 2023-10-02 NOTE — Clinical Note (Incomplete)
 [email or web address]

## 2023-10-02 NOTE — Progress Notes (Signed)
NAME:  Corey [name], MRN:  [id number], DOB:  01/29/1971, LOS: 4 ADMISSION DATE:  09/28/2023, CONSULTATION DATE:  10/01/23 REFERRING MD:  Dr. Madelyne, CHIEF COMPLAINT:  AMS/ hypoxia   History of Present Illness:  44 yoM with PMH significant for chronic hypoxic resp failure on 4L [place], ESRD, calciphylaxis, PAF, chronic hypotension on midodrine , PAD with previous left AKA, aortic dissection s/p repair 2004, and blindness who presented with worsening RLE wound for several weeks, admitted to [facility] 6/26 with cellulitis with presumed osteomyelitis of 1st through 4th digits, also noted to inguinal and buttocks area.  Seen by vascular, ID, nephrology, and pending PMT consult.  Overnight became more encephalopathic, noted on VBG to be hypercarbic with increased rales.  Pt refused BiPAP and surprisingly repeat VBG improved along with mental status transiently.  CXR c/w pulmonary edema.  Cefepime  was also changed to meropenem given concern for neurotoxicity.  Pt repeated became lethargic with desaturations now requiring 9L with worsening hypotension. RRT initiated. Also noted that rhythm had changed from afib/flutter to [facility], started on amiodarone .  PCCM consulted for further recs.   Pertinent  Medical History   Past Medical History:  Diagnosis Date   Arthritis    hands and shoulders   Blindness and low vision    Stargardt disease   Dissection of aorta (HCC) 2004   a. s/p extensive repeair in 2004 in [place] complicated by ESRD, lower extremity paralysis, coma, and extended hospitalization of 2 years   ESRD (end stage renal disease) (HCC)    a. TTS   Headache    History of cardioversion 2014   Hypertension    Neuropathy    Non-healing non-surgical wound 03/2016   PAF (paroxysmal atrial fibrillation) (HCC)    a. s/p DCCV in 2014; b. on Coumadin ; c. CHADS2VASc => 2 (HTN, vascular disease)   Paralysis (HCC)    due to dissection of aorta in 2004, lower extremities   Pneumonia    Significant Hospital  Events: Including procedures, antibiotic start and stop dates in addition to other pertinent events   6/29 rapid response was called on the floor due to lethargy, hypotension, started on Levophed and was transferred to ICU  Interim History / Subjective:  Patient received hemodialysis last night while being on Levophed at 10 mics Currently on Levophed at 2 Unable to come off of vasopressor support He is awake, came off of BiPAP, currently on high flow nasal cannula oxygen   Objective    Blood pressure (!) 80/70, pulse 98, temperature 97.9 F (36.6 C), temperature source Oral, resp. rate (!) 25, height 6' 3 (1.905 m), weight 61.4 kg, SpO2 97%.    Vent Mode: BIPAP;PSV FiO2 (%):  [70 %-100 %] 70 % Set Rate:  [18 bmp] 18 bmp PEEP:  [8 cmH20] 8 cmH20 Pressure Support:  [12 cmH20] 12 cmH20   Intake/Output Summary (Last 24 hours) at 10/02/2023 0837 Last data filed at 10/02/2023 0701 Gross per 24 hour  Intake 1583.09 ml  Output 3000 ml  Net -1416.91 ml   Filed Weights   09/28/23 1331 09/29/23 0844 10/01/23 0431  Weight: 64 kg 64.7 kg 61.4 kg   Examination: General: Acute on chronically ill-appearing male, lying on the bed HEENT: [place]/AT, eyes anicteric.  moist mucus membranes Neuro: Alert, awake following commands Chest: Coarse breath sounds, no wheezes or rhonchi Heart: Irregularly irregular, tachycardic, no murmurs or gallops Abdomen: Soft, nontender, nondistended, bowel sounds present Extremities: Status post left BKA, right lower extremity with Unna boot  Labs and images reviewed  Patient Lines/Drains/Airways Status     Active Line/Drains/Airways     Name Placement date Placement time Site Days   Peripheral IV 09/28/23 20 G Anterior;Left Forearm 09/28/23  1349  Forearm  4   Peripheral IV 10/01/23 Left Antecubital 10/01/23  --  Antecubital  1   Fistula / Graft Left Upper arm Arteriovenous fistula 12/09/15  1057  Upper arm  2854   Fistula / Graft Right Upper arm --  --  Upper  arm  --   Hemodialysis Catheter Right Subclavian Double-lumen 12/09/15  0900  Subclavian  2854   Hemodialysis Catheter Right Subclavian Double-lumen 03/09/16  0834  Subclavian  2763   Wound / Incision (Open or Dehisced) 04/28/16 Other (Comment) Arm Left 04/28/16  1443  Arm  2713   Wound 09/28/23 1955 Pressure Injury Buttocks Left Stage 2 -  Partial thickness loss of dermis presenting as a shallow open injury with a red, pink wound bed without slough. 09/28/23  1955  Buttocks  4   Wound 09/28/23 2017 Other (Comment) Leg Right 09/28/23  2017  Leg  4         Resolved problem list   Assessment and Plan  Acute on chronic hypoxic and hypercarbic respiratory failure in setting of pulmonary edema and volume overload At baseline patient is on 4 L oxygen  Currently on 8 L oxygen , titrate with O2 sat goal 92% Hypercapnia has improved Came off of BiPAP Trend ABGs  End-stage renal disease on hemodialysis Chronic hypotension on midodrine  Patient received hemodialysis overnight while being on Levophed at 10 mics, unfortunately he is not tolerating hemodialysis as an outpatient due to hypotension He is on midodrine  at 20 mg 3 times daily Nephrology is following, recommend not a long-term candidate for dialysis considering intolerance caused by low blood pressure Continue goals of care discussion  Acute metabolic encephalopathy in the setting of hypercapnia, improved Patient mental status is improved significantly He is awake, following commands Avoid sedation Precedex is off  Paroxysmal A-fib with RVR Demand cardiac ischemia Patient is going in and out of A-fib with RVR, last night his heart rate went up to 160s Received amiodarone  bolus, currently on 60 mg/h On Eliquis  for stroke prophylaxis Troponins are trending down  Cellulitis of RLE, calciphylaxis, and presumed osteomyelitis 1st thru 4th digits in setting of severe PAD Vascular surgery recommend continuing Unna boots On broad-spectrum  antibiotics ID is following Blood cultures has been negative Continue wound care  Anemia of chronic disease Thrombocytopenia critical illness Monitor H&H and platelet count, watch for signs of bleeding  GOC Palliative care is consulted, patient remain full code.  Spoke with him and his brother and explained that he is not a candidate for long-term dialysis and sitting tolerance due to hypotension, his brother will be at bedside tomorrow  Best Practice (right click and Reselect all SmartList Selections daily)   Diet/type: NPO swallow evaluation DVT prophylaxis Eliquis  Pressure ulcer(s): Please see nursing notes GI prophylaxis: N/A Lines: Dialysis Catheter Foley:  N/A Code Status:  full code Last date of multidisciplinary goals of care discussion: 6/30, patient's brother will be here tomorrow, will call palliative care team to have family meeting  Labs   CBC: Recent Labs  Lab 09/28/23 1402 09/29/23 0349 10/01/23 0126 10/01/23 1100  WBC 7.3 8.8 13.0* 13.8*  NEUTROABS 6.1  --   --   --   HGB 11.1* 11.7* 12.0* 11.4*  HCT 38.7* 42.0 41.6 38.9*  MCV 96.8 98.6  96.7 96.8  PLT 127* 105* 118* 118*    Basic Metabolic Panel: Recent Labs  Lab 09/29/23 0349 09/30/23 0453 10/01/23 0126 10/01/23 0456 10/01/23 0830 10/01/23 1100  NA 143 138 136 135  --  138  K 5.3* 4.6 4.8 4.7  --  4.3  CL 101 96* 95* 94*  --  99  CO2 17* 23 21* 23  --  23  GLUCOSE 83 65* 98 147* 194* 99  BUN 70* 43* 56* 58*  --  57*  CREATININE 10.17* 6.87* 7.65* 7.84*  --  7.53*  CALCIUM  9.1 9.5 9.1 8.7*  --  8.5*  MG  --   --   --   --   --  2.4  PHOS 9.7* 8.3*  --  8.6*  --   --    GFR: Estimated Creatinine Clearance: 9.9 mL/min (A) (by C-G formula based on SCr of 7.53 mg/dL (H)). Recent Labs  Lab 09/28/23 1402 09/28/23 1407 09/28/23 1611 09/29/23 0349 10/01/23 0126 10/01/23 1100  WBC 7.3  --   --  8.8 13.0* 13.8*  LATICACIDVEN  --  3.2* 3.0*  --   --   --     Liver Function Tests: Recent  Labs  Lab 09/28/23 1402 09/29/23 0349 09/30/23 0453 10/01/23 0456 10/01/23 1100  AST 14*  --   --   --  23  ALT 9  --   --   --  12  ALKPHOS 230*  --   --   --  195*  BILITOT 1.0  --   --   --  0.9  PROT 7.2  --   --   --  6.0*  ALBUMIN  3.3* 3.0* 3.2* 2.9* 2.7*   No results for input(s): LIPASE, AMYLASE in the last 168 hours. No results for input(s): AMMONIA in the last 168 hours.  ABG    Component Value Date/Time   PHART 7.23 (L) 10/01/2023 0450   PCO2ART 52 (H) 10/01/2023 0450   PO2ART 71 (L) 10/01/2023 0450   HCO3 22.6 10/01/2023 1100   TCO2 27 11/17/2022 0921   ACIDBASEDEF 7.0 (H) 10/01/2023 1100   O2SAT 33.9 10/01/2023 1100     Coagulation Profile: Recent Labs  Lab 09/28/23 1402 09/29/23 0349  INR 1.6* 4.2*    Cardiac Enzymes: No results for input(s): CKTOTAL, CKMB, CKMBINDEX, TROPONINI in the last 168 hours.  HbA1C: No results found for: HGBA1C  CBG: Recent Labs  Lab 10/02/23 0035 10/02/23 0148 10/02/23 0330 10/02/23 0405 10/02/23 0514  GLUCAP 87 104* 74 87 82    The patient is critically ill due to acute on chronic respiratory failure with hypoxia and hypercapnia/exaggerated hypotension/cellulitis/osteomyelitis after right lower extremity.  Critical care was necessary to treat or prevent imminent or life-threatening deterioration.  Critical care was time spent personally by me on the following activities: development of treatment plan with patient and/or surrogate as well as nursing, discussions with consultants, evaluation of patient's response to treatment, examination of patient, obtaining history from patient or surrogate, ordering and performing treatments and interventions, ordering and review of laboratory studies, ordering and review of radiographic studies, pulse oximetry, re-evaluation of patient's condition and participation in multidisciplinary rounds.   During this encounter critical care time was devoted to patient care  services described in this note for 37 minutes.     Valinda Novas, MD [facility] See Amion for pager If no response to pager, please call [phone] until 7pm After 7pm, Please call E-link  [phone]  

## 2023-10-02 NOTE — TOC Initial Note (Signed)
 Transition of Care [facility]) - Initial/Assessment Note    Patient Details  Name: Corey [name] MRN: [id number] Date of Birth: 1970/04/27  Transition of Care [facility] CM/SW Contact:    Justina Delcia Czar, RN Phone Number: [phone] 10/02/2023, 3:41 PM  Clinical Narrative:                  Spoke to pt and brother at bedside. Pt wants time to make a decision about his care. Palliative provider in room speaking to pt and family. Family wants time to make a decision.  Will continue to follow up for dc needs.    Expected Discharge Plan: Skilled Nursing Facility Barriers to Discharge: Continued Medical Work up   Patient Goals and CMS Choice            Expected Discharge Plan and Services   Discharge Planning Services: CM Consult                                          Prior Living Arrangements/Services              Need for Family Participation in Patient Care: Yes (Comment) Care giver support system in place?: Yes (comment)      Activities of Daily Living   ADL Screening (condition at time of admission) Independently performs ADLs?: No Does the patient have a NEW difficulty with bathing/dressing/toileting/self-feeding that is expected to last >3 days?: No Does the patient have a NEW difficulty with getting in/out of bed, walking, or climbing stairs that is expected to last >3 days?: No Does the patient have a NEW difficulty with communication that is expected to last >3 days?: No Is the patient deaf or have difficulty hearing?: No Does the patient have difficulty seeing, even when wearing glasses/contacts?: Yes Does the patient have difficulty concentrating, remembering, or making decisions?: No  Permission Sought/Granted                  Emotional Assessment Appearance:: Appears stated age Attitude/Demeanor/Rapport: Engaged Affect (typically observed): Accepting Orientation: : Oriented to Self, Oriented to Place, Oriented to  Time, Oriented to  Situation      Admission diagnosis:  Perineum pain, male [R10.2] Wound infection [T14.8XXA, L08.9] ESRD (end stage renal disease) on dialysis (HCC) [N18.6, Z99.2] Leg wound, right [S81.801A] Sepsis with acute organ dysfunction without septic shock, due to unspecified organism, unspecified organ dysfunction type (HCC) [A41.9, R65.20] Patient Active Problem List   Diagnosis Date Noted   Osteomyelitis (HCC) 09/30/2023   Leg wound, right 09/28/2023   Lactic acidosis 09/28/2023   Chronic respiratory failure with hypoxia (HCC) 09/28/2023   Legally blind 09/28/2023   Underweight 09/28/2023   Cellulitis 09/28/2023   Arthritis 12/13/2022   Debility 11/29/2022   S/P AKA (above knee amputation), left (HCC) 11/29/2022   Protein-calorie malnutrition, severe 11/16/2022   Acute respiratory failure with hypoxia (HCC) 11/14/2022   Gangrene (HCC) 11/14/2022   Parapneumonic effusion 11/04/2022   Lobar pneumonia (HCC) 11/03/2022   PVD (peripheral vascular disease) (HCC) 11/02/2022   Constipation 11/02/2022   Hyperkalemia 10/27/2022   AAA (abdominal aortic aneurysm) without rupture (HCC) 12/18/2020   Dissection of thoracic aorta (HCC)    Essential hypertension    Chronic diastolic CHF (congestive heart failure) (HCC)    Atrial fibrillation, chronic (HCC)    End-stage renal disease on hemodialysis (HCC)    Thrombocytopenia (HCC)  Peripheral neuropathy    Thoracoabdominal aortic aneurysm (TAAA) without rupture (HCC)    Chest pain 01/18/2017   Long term (current) use of anticoagulants [Z79.01] 12/30/2016   Calciphylaxis 05/06/2016   Arm wound, left, sequela 04/28/2016   Nonhealing surgical wound 04/13/2016   ESRD on dialysis (HCC) 02/19/2016   PCP:  Bari Morgans, NP Pharmacy:   [id number] - [place], [place] - [address] [place] [address] Phone: [phone] Fax: [phone]     Social Drivers of Health (SDOH) Social  History: SDOH Screenings   Food Insecurity: No Food Insecurity (09/28/2023)  Housing: Low Risk  (09/28/2023)  Transportation Needs: No Transportation Needs (09/28/2023)  Utilities: Not At Risk (09/28/2023)  Alcohol Screen: Low Risk  (09/26/2022)  Depression (PHQ2-9): Medium Risk (09/26/2022)  Financial Resource Strain: Medium Risk (09/26/2022)  Physical Activity: Inactive (09/26/2022)  Social Connections: Socially Isolated (09/26/2022)  Stress: No Stress Concern Present (09/26/2022)  Tobacco Use: Low Risk  (09/28/2023)   SDOH Interventions:     Readmission Risk Interventions     No data to display

## 2023-10-02 NOTE — Evaluation (Signed)
 Physical Therapy Evaluation Patient Details Name: Corey [name] MRN: [id number] DOB: [birth year] Today's Date: 10/02/2023  History of Present Illness  53 y.o. male admitted 6/26 with Acute on chronic hypoxic and hypercarbic respiratory failure in setting of pulmonary edema and volume overload. 6/29 rapid response was called on the floor due to lethargy, hypotension, started on Levophed and was transferred to ICU. PMH: hronic hypoxic resp failure on 4L [place], ESRD, calciphylaxis, PAF, chronic hypotension on midodrine , PAD with previous left AKA, aortic dissection s/p repair 2004, and blindness  Clinical Impression  Pt admitted with above diagnosis. PTA pt was mod I with mobility, using his wheelchair, able to bathe and dress himself (bird bath), and uses a sliding board to transfer himself. Today he required min assist for bed mobility, and up to max assist for lateral scooting along bed. Shows some posterior instability at EOB but gradually improved with seated balance techniques. Tolerated LE exercises well. Dropped off a drop arm chair after session for transfer training next visit (suggest +2 and sliding board.) Currently appropriate for SNF however if function starts to improve and prognosis is favorable, may update recs to CIR. Pt is very motivated and hopes to recover to PLOF. Has 24/7 assist at home with brother and nephew - however prior notes indicate brother is physically limited with how much he can assist. Will update as appropriate. Pt currently with functional limitations due to the deficits listed below (see PT Problem List). Pt will benefit from acute skilled PT to increase their independence and safety with mobility to allow discharge.           If plan is discharge home, recommend the following: Two people to help with walking and/or transfers;Two people to help with bathing/dressing/bathroom;Assist for transportation;Help with stairs or ramp for entrance;Supervision due to cognitive  status;Assistance with cooking/housework   Can travel by private vehicle   No    Equipment Recommendations Hospital bed;Hoyer lift  Recommendations for Other Services       Functional Status Assessment Patient has had a recent decline in their functional status and demonstrates the ability to make significant improvements in function in a reasonable and predictable amount of time.     Precautions / Restrictions Precautions Precautions: Fall Recall of Precautions/Restrictions: Impaired Precaution/Restrictions Comments: Monitor O2 and BP Restrictions Weight Bearing Restrictions Per Provider Order: No      Mobility  Bed Mobility Overal bed mobility: Needs Assistance Bed Mobility: Rolling, Sidelying to Sit, Sit to Sidelying Rolling: Min assist Sidelying to sit: Min assist     Sit to sidelying: Min assist General bed mobility comments: Min assist to roll and facilitate LEs out of bed, min assist for patient to pull through UEs holding therapist's hand to rise, and min assist to support LEs back into bed. Cues throughout for technque. pt facilitated movement with VC only, but weakness limiting factor.    Transfers Overall transfer level: Needs assistance                 General transfer comment: Lateral scoot along bed with cues for technique, WB to RLE and pushing through UEs, difficulty keeping weight forward. Max assist to scoot up in bed several times. Deferred sliding board transfer - intermittent posterior instability requiring assist, needs sliding board and drop arm chair which I was able to obtain drop arm chair after session, now in room.    Ambulation/Gait  Stairs            Wheelchair Mobility     Tilt Bed    Modified Rankin (Stroke Patients Only)       Balance Overall balance assessment: Needs assistance Sitting-balance support: Bilateral upper extremity supported Sitting balance-Leahy Scale: Poor Sitting balance -  Comments: Posterior lean intermittently, progressed to CGA later in session. Tolerated >5 min EOB. Postural control: Posterior lean                                   Pertinent Vitals/Pain Pain Assessment Pain Assessment: Faces Faces Pain Scale: Hurts little more Pain Location: all over Pain Descriptors / Indicators: Aching Pain Intervention(s): Limited activity within patient's tolerance, Monitored during session, Repositioned    Home Living Family/patient expects to be discharged to:: Private residence Living Arrangements: Other relatives (brother and nephew) Available Help at Discharge: Family;Available 24 hours/day (From prior admission;  bRother is limited phyiscally (had multiple strokes and doesn't ambulate well)) Type of Home: House Home Access: Ramped entrance       Home Layout: One level Home Equipment: BSC/3in1;Wheelchair - Forensic psychologist (2 wheels);Other (comment) (sLIDING BOARD)      Prior Function Prior Level of Function : Needs assist             Mobility Comments: mod indep w/c level mobility. Uses sliding board for transfers ADLs Comments: sponges bathes at baseline; brother assists with higher-level ADLs and iADLs as needed     Extremity/Trunk Assessment   Upper Extremity Assessment Upper Extremity Assessment: Defer to OT evaluation    Lower Extremity Assessment Lower Extremity Assessment: RLE deficits/detail;LLE deficits/detail RLE Deficits / Details: bandaged, Rt knee flexion contracture LLE Deficits / Details: Hx Lt AKA       Communication   Communication Communication: No apparent difficulties    Cognition Arousal: Lethargic Behavior During Therapy: WFL for tasks assessed/performed   PT - Cognitive impairments: No family/caregiver present to determine baseline, Attention, Awareness, Memory, Sequencing, Safety/Judgement                         Following commands: Impaired Following commands impaired:  Follows one step commands inconsistently, Follows one step commands with increased time     Cueing Cueing Techniques: Verbal cues, Gestural cues, Tactile cues     General Comments General comments (skin integrity, edema, etc.): HR 92, BP 75/64 map 69, SpO2 95% on 12L HFNC. while EOB.    Exercises General Exercises - Lower Extremity Long Arc Quad: Strengthening, Right, 10 reps, Seated Hip ABduction/ADduction: Strengthening, Both, 10 reps, Seated Hip Flexion/Marching: Strengthening, Both, 5 reps, Seated Other Exercises Other Exercises: Rt knee extension, prevent further contracture.   Assessment/Plan    PT Assessment Patient needs continued PT services  PT Problem List Decreased strength;Decreased range of motion;Decreased activity tolerance;Decreased balance;Decreased mobility;Decreased cognition;Decreased knowledge of use of DME;Decreased safety awareness;Decreased knowledge of precautions;Cardiopulmonary status limiting activity;Impaired sensation;Pain;Decreased skin integrity       PT Treatment Interventions DME instruction;Gait training    PT Goals (Current goals can be found in the Care Plan section)  Acute Rehab PT Goals Patient Stated Goal: Get stronger, return home PT Goal Formulation: With patient Time For Goal Achievement: 10/16/23 Potential to Achieve Goals: Fair    Frequency Min 2X/week     Co-evaluation               AM-PAC PT 6  Clicks Mobility  Outcome Measure Help needed turning from your back to your side while in a flat bed without using bedrails?: A Little Help needed moving from lying on your back to sitting on the side of a flat bed without using bedrails?: A Little Help needed moving to and from a bed to a chair (including a wheelchair)?: A Lot Help needed standing up from a chair using your arms (e.g., wheelchair or bedside chair)?: Total Help needed to walk in hospital room?: Total Help needed climbing 3-5 steps with a railing? : Total 6  Click Score: 11    End of Session Equipment Utilized During Treatment: Oxygen  Activity Tolerance: Patient tolerated treatment well;Patient limited by fatigue Patient left: in bed;with call bell/phone within reach Nurse Communication: Mobility status PT Visit Diagnosis: Other abnormalities of gait and mobility (R26.89);Muscle weakness (generalized) (M62.81);Difficulty in walking, not elsewhere classified (R26.2);Other symptoms and signs involving the nervous system (R29.898);Pain Pain - part of body:  (all over)    Time: 0927-0957 PT Time Calculation (min) (ACUTE ONLY): 30 min   Charges:   PT Evaluation $PT Eval Moderate Complexity: 1 Mod PT Treatments $Therapeutic Activity: 8-22 mins PT General Charges $$ ACUTE PT VISIT: 1 Visit         Leontine Roads, PT, DPT [facility]  Rehabilitation Services Physical Therapist Office: [phone] Website: [email or web address]   Leontine GORMAN Roads 10/02/2023, 12:49 PM

## 2023-10-02 NOTE — IPAL (Signed)
 [facility]  Advanced Care Planning Note Advanced care planning was held with the following present: The patient, Corey [name], me, Jayson Player, and his nurse, Bari Ku Consent was given by the patient/family/surrogate for the discussion The conversation lasted 17 minutes The following was discussed: Overall clinical trajectory.  Estimation of life expectancy.  Barriers to completing dialysis adequately.  Ongoing potential barriers and possible suffering.  We discussed options like avoiding dialysis as well as ongoing continuation of care. At the conclusion of the meeting the following decisions were made which were to continue with current level of care and ongoing conversations.  No dialysis today

## 2023-10-02 NOTE — Progress Notes (Signed)
 OT Cancellation Note  Patient Details Name: Corey [name] MRN: [id number] DOB: Aug 18, 1970   Cancelled Treatment:    Reason Eval/Treat Not Completed:  (defer to 7/1) Pt speaking with palliative care consult with information shared the patient may benefit from comfort . OT will re attempt 7/1 to allow the patient some privacy and time.   Ely Molt 10/02/2023, 2:47 PM

## 2023-10-02 NOTE — Progress Notes (Signed)
   10/01/23 2230  BiPAP/CPAP/SIPAP  Reason BIPAP/CPAP not in use Other(comment) (STBY. pt on 8L HFNC)  BiPAP/CPAP /SiPAP Vitals  Pulse Rate 94  Resp 20  BP (!) 96/58  SpO2 100 %  MEWS Score/Color  MEWS Score 2  MEWS Score Color Yellow

## 2023-10-03 DIAGNOSIS — J9622 Acute and chronic respiratory failure with hypercapnia: Secondary | ICD-10-CM | POA: Diagnosis not present

## 2023-10-03 DIAGNOSIS — L8990 Pressure ulcer of unspecified site, unspecified stage: Secondary | ICD-10-CM | POA: Insufficient documentation

## 2023-10-03 DIAGNOSIS — J9621 Acute and chronic respiratory failure with hypoxia: Secondary | ICD-10-CM | POA: Diagnosis not present

## 2023-10-03 DIAGNOSIS — I4891 Unspecified atrial fibrillation: Secondary | ICD-10-CM | POA: Diagnosis not present

## 2023-10-03 DIAGNOSIS — A419 Sepsis, unspecified organism: Secondary | ICD-10-CM | POA: Diagnosis not present

## 2023-10-03 DIAGNOSIS — Z515 Encounter for palliative care: Secondary | ICD-10-CM | POA: Diagnosis not present

## 2023-10-03 DIAGNOSIS — Z7189 Other specified counseling: Secondary | ICD-10-CM | POA: Diagnosis not present

## 2023-10-03 LAB — GLUCOSE, CAPILLARY
Glucose-Capillary: 103 mg/dL — ABNORMAL HIGH (ref 70–99)
Glucose-Capillary: 104 mg/dL — ABNORMAL HIGH (ref 70–99)
Glucose-Capillary: 105 mg/dL — ABNORMAL HIGH (ref 70–99)
Glucose-Capillary: 141 mg/dL — ABNORMAL HIGH (ref 70–99)
Glucose-Capillary: 37 mg/dL — CL (ref 70–99)
Glucose-Capillary: 48 mg/dL — ABNORMAL LOW (ref 70–99)
Glucose-Capillary: 68 mg/dL — ABNORMAL LOW (ref 70–99)
Glucose-Capillary: 69 mg/dL — ABNORMAL LOW (ref 70–99)
Glucose-Capillary: 74 mg/dL (ref 70–99)
Glucose-Capillary: 76 mg/dL (ref 70–99)
Glucose-Capillary: 80 mg/dL (ref 70–99)
Glucose-Capillary: 81 mg/dL (ref 70–99)
Glucose-Capillary: 81 mg/dL (ref 70–99)
Glucose-Capillary: 82 mg/dL (ref 70–99)
Glucose-Capillary: 85 mg/dL (ref 70–99)
Glucose-Capillary: 88 mg/dL (ref 70–99)

## 2023-10-03 LAB — CULTURE, BLOOD (ROUTINE X 2)
Culture: NO GROWTH
Culture: NO GROWTH
Special Requests: ADEQUATE

## 2023-10-03 LAB — RENAL FUNCTION PANEL
Albumin: 3.1 g/dL — ABNORMAL LOW (ref 3.5–5.0)
Anion gap: 23 — ABNORMAL HIGH (ref 5–15)
BUN: 38 mg/dL — ABNORMAL HIGH (ref 6–20)
CO2: 22 mmol/L (ref 22–32)
Calcium: 8.9 mg/dL (ref 8.9–10.3)
Chloride: 89 mmol/L — ABNORMAL LOW (ref 98–111)
Creatinine, Ser: 5.83 mg/dL — ABNORMAL HIGH (ref 0.61–1.24)
GFR, Estimated: 11 mL/min — ABNORMAL LOW (ref >60)
Glucose, Bld: 73 mg/dL (ref 70–99)
Phosphorus: 5.9 mg/dL — ABNORMAL HIGH (ref 2.5–4.6)
Potassium: 4.2 mmol/L (ref 3.5–5.1)
Sodium: 134 mmol/L — ABNORMAL LOW (ref 135–145)

## 2023-10-03 MED ORDER — LINEZOLID 600 MG/300ML IV SOLN
600.0000 mg | Freq: Two times a day (BID) | INTRAVENOUS | Status: DC
  Administered 2023-10-03 (×2): 600 mg via INTRAVENOUS
  Filled 2023-10-03 (×3): qty 300

## 2023-10-03 MED ORDER — ONDANSETRON HCL 4 MG/2ML IJ SOLN: 4.0000 mg | Freq: Three times a day (TID) | INTRAMUSCULAR | Status: DC | PRN

## 2023-10-03 NOTE — Progress Notes (Signed)
 NAME:  Corey [name], MRN:  [id number], DOB:  July 12, 1970, LOS: 5 ADMISSION DATE:  09/28/2023, CONSULTATION DATE:  10/01/23 REFERRING MD:  Dr. Madelyne, CHIEF COMPLAINT:  AMS/ hypoxia   History of Present Illness:  89 yoM with PMH significant for chronic hypoxic resp failure on 4L [place], ESRD, calciphylaxis, PAF, chronic hypotension on midodrine , PAD with previous left AKA, aortic dissection s/p repair 2004, and blindness who presented with worsening RLE wound for several weeks, admitted to [facility] 6/26 with cellulitis with presumed osteomyelitis of 1st through 4th digits, also noted to inguinal and buttocks area.  Seen by vascular, ID, nephrology, and pending PMT consult.  Overnight became more encephalopathic, noted on VBG to be hypercarbic with increased rales.  Pt refused BiPAP and surprisingly repeat VBG improved along with mental status transiently.  CXR c/w pulmonary edema.  Cefepime  was also changed to meropenem given concern for neurotoxicity.  Pt repeated became lethargic with desaturations now requiring 9L with worsening hypotension. RRT initiated. Also noted that rhythm had changed from afib/flutter to [facility], started on amiodarone .  PCCM consulted for further recs.   Pertinent  Medical History   Past Medical History:  Diagnosis Date   Arthritis    hands and shoulders   Blindness and low vision    Stargardt disease   Dissection of aorta (HCC) 2004   a. s/p extensive repeair in 2004 in [place] complicated by ESRD, lower extremity paralysis, coma, and extended hospitalization of 2 years   ESRD (end stage renal disease) (HCC)    a. TTS   Headache    History of cardioversion 2014   Hypertension    Neuropathy    Non-healing non-surgical wound 03/2016   PAF (paroxysmal atrial fibrillation) (HCC)    a. s/p DCCV in 2014; b. on Coumadin ; c. CHADS2VASc => 2 (HTN, vascular disease)   Paralysis (HCC)    due to dissection of aorta in 2004, lower extremities   Pneumonia    Significant Hospital  Events: Including procedures, antibiotic start and stop dates in addition to other pertinent events   6/29 rapid response was called on the floor due to lethargy, hypotension, started on Levophed and was transferred to ICU  Interim History / Subjective:  Remains on pressors levophed at 3  Note palliative care meeting yesterday  Episode of hypoglycemia this morning  Lab attempting blood draw - unable to obtain labs so far this morning No c/o  Has not needed bipap   Objective    Blood pressure 93/69, pulse (!) 116, temperature 98.9 F (37.2 C), temperature source Oral, resp. rate 18, height 6' 3 (1.905 m), weight 61.4 kg, SpO2 96%.        Intake/Output Summary (Last 24 hours) at 10/03/2023 9167 Last data filed at 10/03/2023 0700 Gross per 24 hour  Intake 1123.79 ml  Output 0 ml  Net 1123.79 ml   Filed Weights   09/28/23 1331 09/29/23 0844 10/01/23 0431  Weight: 64 kg 64.7 kg 61.4 kg   Examination: General: Acute on chronically ill-appearing male, lying on the bed HEENT: [place]/AT, eyes anicteric.  moist mucus membranes Neuro: awake, alert, conversant, follows commands, appropriate, MAE, weak Chest: resps even non labored on [place], diminished, clear  Heart: Irregularly irregular, tachycardic, no murmurs or gallops Abdomen: Soft, nontender, nondistended, bowel sounds present Extremities: Status post left BKA, right lower extremity wrapped  Labs and images reviewed   Resolved problem list   Assessment and Plan  Acute on chronic hypoxic and hypercarbic respiratory failure in  setting of pulmonary edema and volume overload At baseline patient is on 4 L oxygen  Currently on 8 L oxygen , titrate with O2 sat goal 92% Trend ABGs as needed   End-stage renal disease on hemodialysis Chronic hypotension on midodrine  Overall not tol HD r/t hypotension. Remains pressor dependent  Continue midodrine   Nephrology is following, recommend not a long-term candidate for dialysis considering  intolerance caused by low blood pressure Continue goals of care discussion - palliative care following   Acute metabolic encephalopathy in the setting of hypercapnia, much improved Avoid sedation  Paroxysmal A-fib with RVR Demand cardiac ischemia Continue amiodarone  gtt - consider transition to PO in next 24-48hr if HR stable  On Eliquis  for stroke prophylaxis Troponins are trending down  Cellulitis of RLE, calciphylaxis, and presumed osteomyelitis 1st thru 4th digits in setting of severe PAD Vascular surgery recommend continuing Unna boots On broad-spectrum antibiotics ID is following Final BC neg  Continue wound care  Anemia of chronic disease Thrombocytopenia critical illness Monitor H&H and platelet count, watch for signs of bleeding  GOC Palliative care is following, ongoing goals of care discussions with pt and brother. Long term prognosis poor. Not candidate for long term dialysis  Pt wishes to remain full code for now   Best Practice (right click and Reselect all SmartList Selections daily)   Diet/type: NPO swallow evaluation DVT prophylaxis Eliquis  Pressure ulcer(s): Please see nursing notes GI prophylaxis: N/A Lines: Dialysis Catheter Foley:  N/A Code Status:  full code Last date of multidisciplinary goals of care discussion: 6/30, patient's brother will be here tomorrow, will call palliative care team to have family meeting  Labs   CBC: Recent Labs  Lab 09/28/23 1402 09/29/23 0349 10/01/23 0126 10/01/23 1100 10/02/23 0915  WBC 7.3 8.8 13.0* 13.8*  --   NEUTROABS 6.1  --   --   --   --   HGB 11.1* 11.7* 12.0* 11.4* 12.2*  HCT 38.7* 42.0 41.6 38.9* 36.0*  MCV 96.8 98.6 96.7 96.8  --   PLT 127* 105* 118* 118*  --     Basic Metabolic Panel: Recent Labs  Lab 09/29/23 0349 09/30/23 0453 10/01/23 0126 10/01/23 0456 10/01/23 0830 10/01/23 1100 10/02/23 0915  NA 143 138 136 135  --  138 131*  K 5.3* 4.6 4.8 4.7  --  4.3 3.5  CL 101 96* 95* 94*   --  99  --   CO2 17* 23 21* 23  --  23  --   GLUCOSE 83 65* 98 147* 194* 99  --   BUN 70* 43* 56* 58*  --  57*  --   CREATININE 10.17* 6.87* 7.65* 7.84*  --  7.53*  --   CALCIUM  9.1 9.5 9.1 8.7*  --  8.5*  --   MG  --   --   --   --   --  2.4  --   PHOS 9.7* 8.3*  --  8.6*  --   --   --    GFR: Estimated Creatinine Clearance: 9.9 mL/min (A) (by C-G formula based on SCr of 7.53 mg/dL (H)). Recent Labs  Lab 09/28/23 1402 09/28/23 1407 09/28/23 1611 09/29/23 0349 10/01/23 0126 10/01/23 1100  WBC 7.3  --   --  8.8 13.0* 13.8*  LATICACIDVEN  --  3.2* 3.0*  --   --   --     Liver Function Tests: Recent Labs  Lab 09/28/23 1402 09/29/23 0349 09/30/23 0453 10/01/23 0456 10/01/23 1100  AST 14*  --   --   --  23  ALT 9  --   --   --  12  ALKPHOS 230*  --   --   --  195*  BILITOT 1.0  --   --   --  0.9  PROT 7.2  --   --   --  6.0*  ALBUMIN  3.3* 3.0* 3.2* 2.9* 2.7*   No results for input(s): LIPASE, AMYLASE in the last 168 hours. No results for input(s): AMMONIA in the last 168 hours.  ABG    Component Value Date/Time   PHART 7.321 (L) 10/02/2023 0915   PCO2ART 46.3 10/02/2023 0915   PO2ART 109 (H) 10/02/2023 0915   HCO3 24.1 10/02/2023 0915   TCO2 25 10/02/2023 0915   ACIDBASEDEF 2.0 10/02/2023 0915   O2SAT 98 10/02/2023 0915     Coagulation Profile: Recent Labs  Lab 09/28/23 1402 09/29/23 0349  INR 1.6* 4.2*    Cardiac Enzymes: No results for input(s): CKTOTAL, CKMB, CKMBINDEX, TROPONINI in the last 168 hours.  HbA1C: No results found for: HGBA1C  CBG: Recent Labs  Lab 10/03/23 0506 10/03/23 0608 10/03/23 0610 10/03/23 0641 10/03/23 0742  GLUCAP 81 48* 69* 104* 103*    The patient is critically ill due to acute on chronic respiratory failure with hypoxia and hypercapnia/exaggerated hypotension/cellulitis/osteomyelitis after right lower extremity.  Critical care was necessary to treat or prevent imminent or life-threatening  deterioration.  Critical care was time spent personally by me on the following activities: development of treatment plan with patient and/or surrogate as well as nursing, discussions with consultants, evaluation of patient's response to treatment, examination of patient, obtaining history from patient or surrogate, ordering and performing treatments and interventions, ordering and review of laboratory studies, ordering and review of radiographic studies, pulse oximetry, re-evaluation of patient's condition and participation in multidisciplinary rounds.   During this encounter critical care time was devoted to patient care services described in this note for 32 minutes.    Rockie Myers, NP Pulmonary/Critical Care Medicine  10/03/2023  8:32 AM   See Tracey for personal pager PCCM on call pager [phone] until 7pm. Please call Elink 7p-7a. [phone]

## 2023-10-03 NOTE — Progress Notes (Signed)
 Occupational Therapy Discharge Patient Details Name: Corey [name] MRN: [id number] DOB: 1970-07-22 Today's Date: 10/03/2023 Time:  -     Patient discharged from OT services secondary to end of life palliative on board. ( Low BP ).  Please see latest therapy progress note for current level of functioning and progress toward goals.    OT spoke with RN regarding OT would be appropriate for equipment needs for d/c or family education to patient home. Pt currently does not have this projected as d/c planning so will sign off  GO     Ely Molt 10/03/2023, 2:11 PM

## 2023-10-03 NOTE — Progress Notes (Signed)
 Palliative Medicine Inpatient Follow Up Note HPI: 53 y.o. male  with past medical history of ESRD on hemodialysis, calciphylaxis, paroxysmal A-fib on midodrine , type IB aortic dissection, peripheral artery disease, chronic lower extremity edema, blindness, nonambulatory, on chronic 4 L of oxygen  admitted on 09/28/2023 with worsening right leg wound over the last couple of weeks   Today's Discussion 10/03/2023  *Please note that this is a verbal dictation therefore any spelling or grammatical errors are due to the Dragon Medical One system interpretation.  Chart reviewed inclusive of vital signs, progress notes, laboratory results, and diagnostic images.   I met with Corey [name] at bedside this morning in the company of two of his cousins.   Corey [name] is lying in bed and shares he threw up this morning as a pill was too big. He shares that he continues to have shortness of breath sometimes. He denies active pain.  I asked Corey [name] what he understood from the conversations he has with Dr. Macel and Dr. Harold yesterday. He shares that he was told he has 15 days to live. I shared that while a day amount is hard to determine it is true that he has had blood pressures which are too low to tolerate dialysis and get fluid off of him. I shared openly and honestly that we are at end of life. We discussed that his time will be short - this message if true.  Corey [name] and I again reviewed cardiopulmonary resuscitation and the burdens of such efforts if a patient has an incurable underlying disease. I shared that these would be efforts that would not improve patients life or quality. We reviewed the idea of alternatively allowing for a natural passing with a DNR. Corey [name] shares that he right now wants to remain a full code he hears what is being said but he wants to live. Allowed him time and space to express this.   Per patients RN, Corey [name] additional family members will be coming in later today. The teams plan to meet with  family again tomorrow at some point. She shares she also spoken to the [employer] for patients son to get military leave.   Questions and concerns addressed/Palliative Support Provided.   Objective Assessment: Vital Signs Vitals:   10/03/23 1230 10/03/23 1245  BP: 95/65 (!) 89/66  Pulse:  88  Resp: (!) 22 (!) 21  Temp:    SpO2:  91%    Intake/Output Summary (Last 24 hours) at 10/03/2023 1302 Last data filed at 10/03/2023 1200 Gross per 24 hour  Intake 1386.51 ml  Output 0 ml  Net 1386.51 ml   Last Weight  Most recent update: 10/01/2023  5:59 AM    Weight  61.4 kg (135 lb 5.8 oz)            Gen:  Middled aged AA M critically ill appearing HEENT: moist mucous membranes CV: Regular rate and rhythm PULM:  On 10LPM [place], breathing even ABD: soft/nontender  EXT: L AKA, RLE dressing in place Neuro: Aware of self - intermittently disoriented  SUMMARY OF RECOMMENDATIONS   Full code for the time being - DNR strongly encouraged  Open and honest conversations held in the setting of [name]'s disease burden and inability to tolerate dialysis anymore  Additional family are coming in to visit - Plan for another meeting in the oncoming day(s)  PMT will continue to support patient and his family during this difficult time ______________________________________________________________________________________ Corey [name] [facility] Palliative Medicine Team Team Cell Phone: [phone]  Please utilize secure chat with additional questions, if there is no response within 30 minutes please call the above phone number  Time Spent: 50 Billing based on MDM: High  Palliative Medicine Team providers are available by phone from 7am to 7pm daily and can be reached through the team cell phone.  Should this patient require assistance outside of these hours, please call the patient's attending physician.

## 2023-10-03 NOTE — Progress Notes (Addendum)
 [employer] called RN for update regarding pt condition in order to help pt family member with military leave  Call back number: [phone] Case number: [id number]

## 2023-10-03 NOTE — Progress Notes (Signed)
 [facility] Progress Note  Subjective:  Patient remains critically ill in the ICU.  Numerous conversations yesterday with multiple teams regarding his level of morbidity. Discussed again today that he likely cannot tolerate significant dialysis (long enough treatment and removal of sufficient UF). He is still processing this information. He wants to continue conversations with the other team members. Says he feels chronically bad but not any worse  Vitals:   10/03/23 0815 10/03/23 0830 10/03/23 0845 10/03/23 0900  BP: 105/70 103/64 94/74 102/73  Pulse:  94 (!) 136 (!) 133  Resp: (!) 25 (!) 25 20 (!) 22  Temp:      TempSrc:      SpO2:  92% 98% 90%  Weight:      Height:        Exam: GEN: Chronically ill-appearing, lying in bed, intermittent pain ENT: no nasal discharge, mmm EYES: no scleral icterus, eomi CV: normal rate, no rub, JVD present PULM: Bilateral chest rise, mild increased work of breathing ABD: NABS, non-distended SKIN: Lower extremity wound covered, otherwise no rashes or jaundice EXT: no edema, warm and well perfused     CXR 6/26 - IMPRESSION: Cardiomegaly with diffuse increased interstitial and ground-glass opacity probably representing edema. Atelectasis and or scarring at the bases    OP HD: TTS [facility] From sept 2024-> 4h  66kg TDC  No heparin  (allergic)  lock TDC w/ citrate       Assessment/ Plan: R lower extremity wounds: Antibiotics and mgmt per primary team ESRD: on HD TTS. Had HD 6/27 here then urgent dialysis overnight for shortness of breath on 6/29.  Difficulty tolerating dialysis because of hypotension. An acute on chronic issue. Follow conversations today and consider some HD tonight. BP/ chronic hypotension: on midodrine  15 tid. Usual BP's are in the upper 80s at OP unit.  Has intermittently required norepinephrine.  Likely a limiting factor in his care. Ongoing discussions Volume: With pulmonary edema.  UF if tolerated Anemia of esrd:  Hb 11, follow FTT: recommend GOC discussions for this patient. He has chronic hypotension which is severe (bp 80's-90's in OP setting on HD) and will continue to limit our ability to get fluid off with dialysis.  We discussed goals of care on 6/30 per IPAL note. Appreciate help from other teams       10/03/2023, 9:54 AM  Recent Labs  Lab 09/30/23 0453 10/01/23 0126 10/01/23 0456 10/01/23 1100 10/02/23 0915  HGB  --    < >  --  11.4* 12.2*  ALBUMIN  3.2*  --  2.9* 2.7*  --   CALCIUM  9.5   < > 8.7* 8.5*  --   PHOS 8.3*  --  8.6*  --   --   CREATININE 6.87*   < > 7.84* 7.53*  --   K 4.6   < > 4.7 4.3 3.5   < > = values in this interval not displayed.   No results for input(s): IRON, TIBC, FERRITIN in the last 168 hours. Inpatient medications:  apixaban   2.5 mg Oral BID   Chlorhexidine  Gluconate Cloth  6 each Topical Q0600   dextrose   2 Tube Oral STAT   feeding supplement  1 Container Oral TID BM   midodrine   20 mg Oral TID WC   multivitamin  1 tablet Oral QHS   sevelamer  carbonate  3,200 mg Oral TID WC   sodium chloride  flush  3 mL Intravenous Q12H   thiamine (VITAMIN B1) injection  100 mg Intravenous  Daily    albumin  human Stopped (10/01/23 2110)   amiodarone  60 mg/hr (10/03/23 0900)   anticoagulant sodium citrate      cefTAZidime  (FORTAZ )  IV Stopped (10/02/23 2036)   linezolid (ZYVOX) IV     norepinephrine (LEVOPHED) Adult infusion 3 mcg/min (10/03/23 0900)   acetaminophen  **OR** acetaminophen , albumin  human, albuterol , alteplase , anticoagulant sodium citrate , dextrose , oxyCODONE -acetaminophen 

## 2023-10-03 DEATH — deceased

## 2023-10-04 DIAGNOSIS — Z7189 Other specified counseling: Secondary | ICD-10-CM | POA: Diagnosis not present

## 2023-10-04 DIAGNOSIS — A419 Sepsis, unspecified organism: Secondary | ICD-10-CM | POA: Diagnosis not present

## 2023-10-04 DIAGNOSIS — J9622 Acute and chronic respiratory failure with hypercapnia: Secondary | ICD-10-CM | POA: Diagnosis not present

## 2023-10-04 DIAGNOSIS — Z66 Do not resuscitate: Secondary | ICD-10-CM

## 2023-10-04 DIAGNOSIS — I4891 Unspecified atrial fibrillation: Secondary | ICD-10-CM | POA: Diagnosis not present

## 2023-10-04 DIAGNOSIS — Z515 Encounter for palliative care: Secondary | ICD-10-CM | POA: Diagnosis not present

## 2023-10-04 DIAGNOSIS — J9621 Acute and chronic respiratory failure with hypoxia: Secondary | ICD-10-CM | POA: Diagnosis not present

## 2023-10-04 LAB — GLUCOSE, CAPILLARY
Glucose-Capillary: 101 mg/dL — ABNORMAL HIGH (ref 70–99)
Glucose-Capillary: 63 mg/dL — ABNORMAL LOW (ref 70–99)
Glucose-Capillary: 91 mg/dL (ref 70–99)
Glucose-Capillary: 73 mg/dL (ref 70–99)

## 2023-10-04 MED ORDER — HYDROMORPHONE BOLUS VIA INFUSION: 1.0000 mg | INTRAVENOUS | Status: DC | PRN

## 2023-10-04 MED ORDER — POLYVINYL ALCOHOL 1.4 % OP SOLN
1.0000 [drp] | Freq: Four times a day (QID) | OPHTHALMIC | Status: DC | PRN
Start: 2023-10-04 — End: 2023-10-04

## 2023-10-04 MED ORDER — SODIUM CHLORIDE 0.9 % IV SOLN
2.0000 mg/h | INTRAVENOUS | Status: DC
  Administered 2023-10-04: 1 mg/h via INTRAVENOUS
  Filled 2023-10-04: qty 5

## 2023-10-04 MED ORDER — HYDROMORPHONE BOLUS VIA INFUSION: 0.5000 mg | INTRAVENOUS | Status: DC | PRN

## 2023-10-04 MED ORDER — MIDAZOLAM BOLUS VIA INFUSION: 1.0000 mg | INTRAVENOUS | Status: DC | PRN

## 2023-10-04 MED ORDER — MIDAZOLAM-SODIUM CHLORIDE 100-0.9 MG/100ML-% IV SOLN
2.0000 mg/h | INTRAVENOUS | Status: DC
  Administered 2023-10-04: 2 mg/h via INTRAVENOUS
  Filled 2023-10-04: qty 100

## 2023-10-04 MED ORDER — GLYCOPYRROLATE 0.2 MG/ML IJ SOLN: 0.4000 mg | INTRAMUSCULAR | Status: DC

## 2023-10-04 MED ORDER — BIOTENE DRY MOUTH MT LIQD
15.0000 mL | OROMUCOSAL | Status: DC | PRN
Start: 2023-10-04 — End: 2023-10-04

## 2023-10-04 MED ORDER — HYDROMORPHONE HCL-NACL 50-0.9 MG/50ML-% IV SOLN
2.0000 mg/h | INTRAVENOUS | Status: DC
  Filled 2023-10-04: qty 50

## 2023-10-13 ENCOUNTER — Encounter (INDEPENDENT_AMBULATORY_CARE_PROVIDER_SITE_OTHER)

## 2023-10-17 ENCOUNTER — Inpatient Hospital Stay: Payer: Self-pay | Admitting: Infectious Diseases

## 2023-10-22 DIAGNOSIS — G8929 Other chronic pain: Secondary | ICD-10-CM | POA: Diagnosis not present

## 2023-10-22 DIAGNOSIS — M19041 Primary osteoarthritis, right hand: Secondary | ICD-10-CM | POA: Diagnosis not present

## 2023-11-03 NOTE — Progress Notes (Signed)
 Late Note Entry- 10-10-2023  Contacted [facility] to be advised of pt's passing.   Randine Mungo Renal Navigator [phone]

## 2023-11-03 NOTE — Progress Notes (Signed)
   15-Oct-2023 0340  Vent Select  $ Ventilator Initial/Subsequent  Subsequent  Invasive or Noninvasive Noninvasive  Adult Vent Y  Adult Ventilator Settings  Vent Type Servo i  Vent Mode PSV;BIPAP  FiO2 (%) 60 %  Pressure Support 12 cmH20  PEEP 8 cmH20  Adult Ventilator Measurements  Peak Airway Pressure 20 L/min  Mean Airway Pressure 10 cmH20  Plateau Pressure 16 cmH20  Resp Rate Spontaneous 22 br/min  Resp Rate Total 22 br/min  Exhaled Vt 542 mL  Spont TV 586 mL  Measured Ve 11.8 L  Auto PEEP 0 cmH20  Total PEEP 8 cmH20  SpO2 100 %  Adult Ventilator Alarms  Alarms On Y  Ve High Alarm 22 L/min  Ve Low Alarm 5 L/min  Resp Rate High Alarm 40 br/min  Resp Rate Low Alarm 10  PEEP Low Alarm 6 cmH2O  Press High Alarm 30 cmH2O  T Apnea 20 sec(s)  VAP Prevention  HOB> 30 Degrees Y  Breath Sounds  Bilateral Breath Sounds Diminished;Coarse crackles  Vent Respiratory Assessment  Patient Effort Fair  Level of Consciousness Responds to Pain  Respiratory Pattern Regular;Unlabored;Symmetrical  Patient Tolerance Tolerated well

## 2023-11-03 NOTE — Progress Notes (Signed)
 [facility] Progress Note  Subjective:  Worsening hypoxia overnight now on BiPAP.  More family present today.  Discussed overall decline.  Unable to tolerate sufficient dialysis.  We discussed barriers and reasons for not recommending continuous renal replacement therapy.  Vitals:   10-06-2023 0630 10-06-23 0645 10-06-2023 0749 October 06, 2023 0904  BP: 98/72 101/72  97/77  Pulse: (!) 103   (!) 103  Resp: (!) 21 (!) 22  (!) 22  Temp:   (!) 97.3 F (36.3 C)   TempSrc:   Axillary   SpO2: 100%   100%  Weight:      Height:        Exam: GEN: Chronically ill-appearing, lying in bed, appears comfortable ENT: no nasal discharge, mmm EYES: no scleral icterus, eomi CV: normal rate, no rub, JVD present PULM: Bilateral chest rise, mild increased work of breathing ABD: NABS, non-distended SKIN: Lower extremity wound covered, otherwise no rashes or jaundice EXT: no edema, warm and well perfused     CXR 6/26 - IMPRESSION: Cardiomegaly with diffuse increased interstitial and ground-glass opacity probably representing edema. Atelectasis and or scarring at the bases    OP HD: TTS [facility] From sept 2024-> 4h  66kg TDC  No heparin  (allergic)  lock TDC w/ citrate       Assessment/ Plan: R lower extremity wounds: Antibiotics and mgmt per primary team ESRD: on HD TTS. Had HD 6/27 here then urgent dialysis overnight for shortness of breath on 6/29.  Difficulty tolerating dialysis because of hypotension. An acute on chronic issue. After conversations with all teams today patient opted for comfort care which is very reasonable. BP/ chronic hypotension: on midodrine  15 tid. Now requiring norepinephrine.  Moving towards comfort care. Volume: With pulmonary edema.  Unable to tolerate hemodialysis at this time Anemia of esrd: Hb 11, follow QUU:jicjwrz conversations today given overall decline.  Plan is for comfort care.  Appreciate team's input.  We will sign off at this time       10/06/23,  11:12 AM  Recent Labs  Lab 10/01/23 0456 10/01/23 1100 10/02/23 0915 10/03/23 1030  HGB  --  11.4* 12.2*  --   ALBUMIN  2.9* 2.7*  --  3.1*  CALCIUM  8.7* 8.5*  --  8.9  PHOS 8.6*  --   --  5.9*  CREATININE 7.84* 7.53*  --  5.83*  K 4.7 4.3 3.5 4.2   No results for input(s): IRON, TIBC, FERRITIN in the last 168 hours. Inpatient medications:  glycopyrrolate  0.4 mg Intravenous Q4H    albumin  human Stopped (10/01/23 2110)   HYDROmorphone  1 mg/hr (2023-10-06 1006)   midazolam  2 mg/hr (2023-10-06 0951)   acetaminophen  **OR** acetaminophen , albumin  human, albuterol , alteplase , antiseptic oral rinse, artificial tears, HYDROmorphone , midazolam , ondansetron  (ZOFRAN ) IV

## 2023-11-03 NOTE — Progress Notes (Signed)
   Palliative Medicine Inpatient Follow Up Note HPI: 53 y.o. male  with past medical history of ESRD on hemodialysis, calciphylaxis, paroxysmal A-fib on midodrine , type IB aortic dissection, peripheral artery disease, chronic lower extremity edema, blindness, nonambulatory, on chronic 4 L of oxygen  admitted on 09/28/2023 with worsening right leg wound over the last couple of weeks   Today's Discussion 10/12/23  *Please note that this is a verbal dictation therefore any spelling or grammatical errors are due to the Dragon Medical One system interpretation.  Chart reviewed inclusive of vital signs, progress notes, laboratory results, and diagnostic images.   A family meeting was held this morning in the company of patients brother, Evalene, cousin Shona, and daughter, [name]. There were three other family members present at bedside.  We discussed the changes which had occurred overnight inclusive of worsening hypercarbic respiratory failure in the setting of volume overload now requiring bipap support. We discussed that Marques is now experiencing more distress. His family notes that they do not want him to suffer and the recognize that we are at the end of his life.  We talked about transition to comfort measures in house and what that would entail inclusive of medications to control pain, dyspnea, agitation, nausea, itching, and hiccups.  We discussed stopping all uneccessary measures such as cardiac monitoring, blood draws, needle sticks, and frequent vital signs.   We reviewed that death is anticipated and when that occurs we will not pursue chest compressions, shocks, or intubation. Family shares understanding of this and the transition to a DNAR/DNI code status.   Acknowledged how difficult of a situation Rashon is in. Utilized reflective listening throughout our time together.   Additional family members are coming in from out of town but family chooses not to delay initiation of comfort care.    Family has declined chaplain support.   Questions and concerns addressed/Palliative Support Provided.   Objective Assessment: Vital Signs Vitals:   Oct 12, 2023 0749 10/12/2023 0904  BP:  97/77  Pulse:  (!) 103  Resp:  (!) 22  Temp: (!) 97.3 F (36.3 C)   SpO2:  100%    Intake/Output Summary (Last 24 hours) at Oct 12, 2023 0951 Last data filed at 10-12-2023 0647 Gross per 24 hour  Intake 1677.62 ml  Output --  Net 1677.62 ml   Last Weight  Most recent update: 10/01/2023  5:59 AM    Weight  61.4 kg (135 lb 5.8 oz)            Gen:  Middled aged AA M critically ill appearing HEENT: moist mucous membranes CV: Irregular rate and rhythm PULM:  On Bipap ABD: soft/nontender  EXT: L AKA, RLE dressing in place Neuro: Aware of self - intermittently disoriented  SUMMARY OF RECOMMENDATIONS   DNAR/DNI  Comfort care  Will initiate dilaudid  and midazolam  gtts  Additional comfort medications per [facility]  Unrestricted visitation  Anticipate in hospital death  PMT will continue to support patient and his family during this difficult time ______________________________________________________________________________________ Rosaline Becton [place] Palliative Medicine Team Team Cell Phone: [phone] Please utilize secure chat with additional questions, if there is no response within 30 minutes please call the above phone number  Time Spent: 50 Billing based on MDM: High  Palliative Medicine Team providers are available by phone from 7am to 7pm daily and can be reached through the team cell phone.  Should this patient require assistance outside of these hours, please call the patient's attending physician.

## 2023-11-03 NOTE — Death Summary Note (Signed)
 DEATH SUMMARY   Patient Details  Name: Corey [name] MRN: [id number] DOB: [birth year]  Admission/Discharge Information   Admit Date:  10/14/23  Date of Death: Date of Death: October 20, 2023  Time of Death: Time of Death: 1045  Length of Stay: 6  Referring Physician: Horton, Lovika, NP   Reason(s) for Hospitalization  Acute on chronic hypoxic and hypercarbic respiratory failure in setting of pulmonary edema and volume overload End-stage renal disease on hemodialysis Chronic hypotension on midodrine   Acute metabolic/uremic encephalopathy in the setting of hypercapnia, much improved Paroxysmal A-fib with RVR Demand cardiac ischemia Sepsis due to cellulitis of RLE, calciphylaxis, and presumed osteomyelitis 1st thru 4th digits in setting of severe PAD, POA Anemia of chronic disease Thrombocytopenia critical illness DNR status  Diagnoses  Preliminary cause of death: Withdrawal of care in the setting of multisystem organ failure and inability to tolerate hemodialysis Secondary Diagnoses (including complications and co-morbidities):  Principal Problem:   Leg wound, right Active Problems:   ESRD (end stage renal disease) on dialysis (HCC)   Calciphylaxis   Long term (current) use of anticoagulants [Z79.01]   Dissection of thoracic aorta (HCC)   Atrial fibrillation, chronic (HCC)   End-stage renal disease on hemodialysis (HCC)   Gangrene (HCC)   Debility   S/P AKA (above knee amputation), left (HCC)   Lactic acidosis   Chronic respiratory failure with hypoxia (HCC)   Legally blind   Underweight   Cellulitis   Osteomyelitis (HCC)   Sepsis with acute organ dysfunction without septic shock (HCC)   Pressure injury of skin   Brief Hospital Course (including significant findings, care, treatment, and services provided and events leading to death)  Corey [name] is a 53 y.o. year old male with PMH significant for chronic hypoxic resp failure on 4L , ESRD, calciphylaxis, PAF, chronic  hypotension on midodrine , PAD with previous left AKA, aortic dissection s/p repair 2004, and blindness who presented with worsening RLE wound for several weeks, admitted to [facility] 14-Oct-2023 with cellulitis with presumed osteomyelitis of 1st through 4th digits, also noted to inguinal and buttocks area. Seen by vascular, ID, nephrology, and pending PMT consult. Overnight became more encephalopathic, noted on VBG to be hypercarbic with increased rales. Pt refused BiPAP and surprisingly repeat VBG improved along with mental status transiently. CXR c/w pulmonary edema. Cefepime  was also changed to meropenem given concern for neurotoxicity. Pt repeated became lethargic with desaturations now requiring 9L with worsening hypotension. RRT initiated. Also noted that rhythm had changed from afib/flutter to [facility], started on amiodarone .  Patient was transferred to ICU, he was continued on IV antibiotics and amiodarone .  He became hypotensive requiring Levophed therapy, urgent dialysis was done on 6/29, patient remained on vasopressors postdialysis, midodrine  was increased to 20 mg times daily.  Due to inability to tolerate hemodialysis in the setting of hypotension, goals of care discussions were carried with patient and his family, patient decided to proceed with comfort care.  Comfort care orders written, patient passed on October 20, 2023 at 10:45 AM.  Patient's family was at bedside   Pertinent Labs and Studies  Significant Diagnostic Studies DG CHEST PORT 1 VIEW Result Date: 10/01/2023 CLINICAL DATA:  53 year old male with altered mental status and abnormal pulmonary auscultation. EXAM: PORTABLE CHEST 1 VIEW COMPARISON:  Portable chest October 14, 2023 and earlier. FINDINGS: Portable AP semi upright view at 0341 hours. Stable cardiomegaly and mediastinal contours. Stable right chest dual lumen vascular catheter. Right axillary vascular stent redemonstrated. Symmetric increased reticulonodular opacity. Chronic but increased streaking  curvilinear lower lung and right minor fissure opacity most resembling atelectasis. No pneumothorax. New veiling opacity also at the right lung base, right hemidiaphragm now obscured. Chronic sclerosis throughout the visible spine, previously thought to be renal osteodystrophy related. Paucity of bowel gas. Diffuse vascular calcified atherosclerosis throughout the chest. IMPRESSION: 1. Symmetrically increased pulmonary interstitial opacity favored to be Acute Interstitial Edema. Viral/atypical respiratory infection felt less likely. 2. Evidence of small new or increased right pleural effusion. Increased bilateral lower lung atelectasis. 3. Underlying chronic cardiomegaly, abnormal aorta, diffuse calcified atherosclerosis, renal osteodystrophy. Electronically Signed   By: VEAR Hurst M.D.   On: 10/01/2023 03:55   CT PELVIS W CONTRAST Result Date: 09/28/2023 CLINICAL DATA:  Soft tissue infection suspected. EXAM: CT PELVIS WITH CONTRAST TECHNIQUE: Multidetector CT imaging of the pelvis was performed using the standard protocol following the bolus administration of intravenous contrast. RADIATION DOSE REDUCTION: This exam was performed according to the departmental dose-optimization program which includes automated exposure control, adjustment of the mA and/or kV according to patient size and/or use of iterative reconstruction technique. CONTRAST:  75mL OMNIPAQUE  IOHEXOL  350 MG/ML SOLN COMPARISON:  CT chest abdomen and pelvis 10/27/2022. FINDINGS: Urinary Tract: Bladder is completely decompressed and not well evaluated. Bowel: Unremarkable visualized pelvic bowel loops. Appendix is within normal limits. Vascular/Lymphatic: There are extensive peripheral vascular calcifications. Aortic calcifications and atherosclerotic plaque are also seen. Info IVC filter is partially visualized. No enlarged lymph nodes are identified. Reproductive:  Prostate gland within normal limits. Other: There is a small amount of free fluid in  the pelvis. There is a fat containing umbilical hernia, small in size. Musculoskeletal: There is skin thickening in the inferior medial left buttocks extending to the level of the anus. No enhancing fluid collection or soft tissue gas. There is also some subcutaneous edema in the inferior mid buttocks bilaterally without fluid collection. Diffuse osseous sclerosis is again seen likely sequelae from chronic renal osteodystrophy. Degenerative changes affect the spine. IMPRESSION: 1. Skin thickening in the inferior medial left buttocks extending to the level of the anus compatible with cellulitis. No enhancing fluid collection or soft tissue gas. 2. Nonspecific subcutaneous edema in the inferior mid buttocks bilaterally without fluid collection. 3. Small amount of free fluid in the pelvis. 4. Aortic atherosclerosis. Aortic Atherosclerosis (ICD10-I70.0). Electronically Signed   By: Greig Pique M.D.   On: 09/28/2023 16:39   DG Tibia/Fibula Right Result Date: 09/28/2023 CLINICAL DATA:  Possible sepsis wound right leg EXAM: RIGHT TIBIA AND FIBULA - 2 VIEW COMPARISON:  None Available. FINDINGS: Advanced vascular calcifications. Diffuse osteopenia limits assessment of osseous structures. Large wound or ulcer along the medial and posterior aspect of the mid to lower leg. Nonspecific mild periosteal reaction at the shafts of the distal tibia and fibula. IMPRESSION: Large wound or ulcer along the medial and posterior aspect of the mid to lower leg. Diffuse osteopenia limits assessment of osseous structures. Nonspecific mild periosteal reaction at the shafts of the distal tibia and fibula. MRI may be obtained if suspicion for osseous infection Electronically Signed   By: Luke Bun M.D.   On: 09/28/2023 15:03   DG Foot Complete Right Result Date: 09/28/2023 CLINICAL DATA:  Wound EXAM: RIGHT FOOT COMPLETE - 3+ VIEW COMPARISON:  None Available. FINDINGS: Extensive vascular calcification. Marked osteopenia limits the  exam. Large dorsal defect/deformity at the distal foot presumably due to wound or ulcer. Probable ulceration at the tip of the first distal phalanx. Suspicion of bony resorptive changes involving the first  distal phalanx, second distal and middle phalanx, third distal and middle phalanx as well as distal portion of third proximal phalanx, and distal phalanx of fourth digit. No definite soft tissue gas. IMPRESSION: Large dorsal defect/deformity at the distal foot presumably due to wound or ulcer. Probable ulceration at the tip of the first distal phalanx. Limited assessment of osseous structures due to marked osteopenia. Bony resorptive/destructive changes involving the first through fourth digits as described above concerning for osteomyelitis. Electronically Signed   By: Luke Bun M.D.   On: 09/28/2023 15:01   DG Chest Port 1 View Result Date: 09/28/2023 CLINICAL DATA:  Possible sepsis EXAM: PORTABLE CHEST 1 VIEW COMPARISON:  12/11/2022, 10/27/2022 FINDINGS: Right-sided central venous catheter tip at the right atrial IVC junction. Cardiomegaly with aortic atherosclerosis. Diffuse increased interstitial and ground-glass opacity probably represents edema. Linear atelectasis or scarring in the lower lungs. No pleural effusion or pneumothorax. IMPRESSION: Cardiomegaly with diffuse increased interstitial and ground-glass opacity probably representing edema. Atelectasis and or scarring at the bases Electronically Signed   By: Luke Bun M.D.   On: 09/28/2023 14:54    Microbiology Recent Results (from the past 240 hours)  Blood Culture (routine x 2)     Status: None   Collection Time: 09/28/23  2:05 PM   Specimen: BLOOD LEFT ARM  Result Value Ref Range Status   Specimen Description BLOOD LEFT ARM  Final   Special Requests   Final    BOTTLES DRAWN AEROBIC AND ANAEROBIC Blood Culture results may not be optimal due to an inadequate volume of blood received in culture bottles   Culture   Final    NO  GROWTH 5 DAYS Performed at [facility], [address] [place], [place] [address]    Report Status 10/03/2023 FINAL  Final  Blood Culture (routine x 2)     Status: None   Collection Time: 09/28/23  2:12 PM   Specimen: BLOOD LEFT FOREARM  Result Value Ref Range Status   Specimen Description BLOOD LEFT FOREARM  Final   Special Requests   Final    BOTTLES DRAWN AEROBIC AND ANAEROBIC Blood Culture adequate volume   Culture   Final    NO GROWTH 5 DAYS Performed at [facility], [address], [place], [place] [address]    Report Status 10/03/2023 FINAL  Final  MRSA Next Gen by PCR, Nasal     Status: None   Collection Time: 10/01/23 10:19 PM   Specimen: Nasal Mucosa; Nasal Swab  Result Value Ref Range Status   MRSA by PCR Next Gen NOT DETECTED NOT DETECTED Final    Comment: (NOTE) The GeneXpert MRSA Assay (FDA approved for NASAL specimens only), is one component of a comprehensive MRSA colonization surveillance program. It is not intended to diagnose MRSA infection nor to guide or monitor treatment for MRSA infections. Test performance is not FDA approved in patients less than 62 years old. Performed at [facility], [address] [place], [place] [address]     Lab Basic Metabolic Panel: Recent Labs  Lab 09/29/23 [phone] 09/30/23 0453 10/01/23 0126 10/01/23 0456 10/01/23 0830 10/01/23 1100 10/02/23 0915 10/03/23 1030  NA 143 138 136 135  --  138 131* 134*  K 5.3* 4.6 4.8 4.7  --  4.3 3.5 4.2  CL 101 96* 95* 94*  --  99  --  89*  CO2 17* 23 21* 23  --  23  --  22  GLUCOSE  83 65* 98 147* 194* 99  --  73  BUN 70* 43* 56* 58*  --  57*  --  38*  CREATININE 10.17* 6.87* 7.65* 7.84*  --  7.53*  --  5.83*  CALCIUM  9.1 9.5 9.1 8.7*  --  8.5*  --  8.9  MG  --   --   --   --   --  2.4  --   --   PHOS 9.7* 8.3*  --  8.6*  --   --   --  5.9*   Liver Function Tests: Recent Labs  Lab 09/28/23 1402 09/29/23 0349 09/30/23 0453 10/01/23 0456 10/01/23 1100  10/03/23 1030  AST 14*  --   --   --  23  --   ALT 9  --   --   --  12  --   ALKPHOS 230*  --   --   --  195*  --   BILITOT 1.0  --   --   --  0.9  --   PROT 7.2  --   --   --  6.0*  --   ALBUMIN  3.3* 3.0* 3.2* 2.9* 2.7* 3.1*   No results for input(s): LIPASE, AMYLASE in the last 168 hours. No results for input(s): AMMONIA in the last 168 hours. CBC: Recent Labs  Lab 09/28/23 1402 09/29/23 0349 10/01/23 0126 10/01/23 1100 10/02/23 0915  WBC 7.3 8.8 13.0* 13.8*  --   NEUTROABS 6.1  --   --   --   --   HGB 11.1* 11.7* 12.0* 11.4* 12.2*  HCT 38.7* 42.0 41.6 38.9* 36.0*  MCV 96.8 98.6 96.7 96.8  --   PLT 127* 105* 118* 118*  --    Cardiac Enzymes: No results for input(s): CKTOTAL, CKMB, CKMBINDEX, TROPONINI in the last 168 hours. Sepsis Labs: Recent Labs  Lab 09/28/23 1402 09/28/23 1407 09/28/23 1611 09/29/23 0349 10/01/23 0126 10/01/23 1100  WBC 7.3  --   --  8.8 13.0* 13.8*  LATICACIDVEN  --  3.2* 3.0*  --   --   --     Procedures/Operations     Karsyn Jamie 11-03-2023, 1:08 PM

## 2023-11-03 NOTE — Progress Notes (Signed)
 eLink Physician-Brief Progress Note Patient Name: Corey [name] DOB: 1970-04-17 MRN: [id number]   Date of Service  10/20/23  HPI/Events of Note  Continues to decline, now requiring BiPAP therapy which was concerning for the family, they came to bedside to be with Oneil.  We again discussed the risk, benefits, and alternatives to pursuing cardiopulmonary resuscitation and I reaffirmed the recommendation of maintaining DNR status.    eICU Interventions  The family seems appreciative of the conversation and the updates, but do not feel ready to change any advance care planning at this time.  Maintain full code, maintain current supportive measures     Intervention Category Minor Interventions: Communication with other healthcare providers and/or family  Ria Hemming 2023-10-20, 4:22 AM

## 2023-11-03 NOTE — Progress Notes (Signed)
 Physical Therapy Discharge Patient Details Name: Corey [name] MRN: [id number] DOB: January 13, 1971 Today's Date: 10-30-2023 Time:  -     Patient discharged from PT services secondary to medical decline - will need to re-order PT to resume therapy services.  Please see latest therapy progress note for current level of functioning and progress toward goals.    Progress and discharge plan discussed with patient and/or caregiver: Patient unable to participate in discharge planning and no caregivers available  GP     [name] 10-30-2023, 8:53 AM  [name] M,PT Acute Rehab Services [phone]

## 2023-11-03 NOTE — Progress Notes (Signed)
 NAME:  Corey [name], MRN:  [id number], DOB:  [birth year], LOS: 6 ADMISSION DATE:  09/28/2023, CONSULTATION DATE:  10/01/23 REFERRING MD:  Dr. Madelyne, CHIEF COMPLAINT:  AMS/ hypoxia   History of Present Illness:  73 yoM with PMH significant for chronic hypoxic resp failure on 4L [place], ESRD, calciphylaxis, PAF, chronic hypotension on midodrine , PAD with previous left AKA, aortic dissection s/p repair 2004, and blindness who presented with worsening RLE wound for several weeks, admitted to [facility] 6/26 with cellulitis with presumed osteomyelitis of 1st through 4th digits, also noted to inguinal and buttocks area.  Seen by vascular, ID, nephrology, and pending PMT consult.  Overnight became more encephalopathic, noted on VBG to be hypercarbic with increased rales.  Pt refused BiPAP and surprisingly repeat VBG improved along with mental status transiently.  CXR c/w pulmonary edema.  Cefepime  was also changed to meropenem given concern for neurotoxicity.  Pt repeated became lethargic with desaturations now requiring 9L with worsening hypotension. RRT initiated. Also noted that rhythm had changed from afib/flutter to [facility], started on amiodarone .  PCCM consulted for further recs.   Pertinent  Medical History   Past Medical History:  Diagnosis Date   Arthritis    hands and shoulders   Blindness and low vision    Stargardt disease   Dissection of aorta (HCC) 2004   a. s/p extensive repeair in 2004 in [place] complicated by ESRD, lower extremity paralysis, coma, and extended hospitalization of 2 years   ESRD (end stage renal disease) (HCC)    a. TTS   Headache    History of cardioversion 2014   Hypertension    Neuropathy    Non-healing non-surgical wound 03/2016   PAF (paroxysmal atrial fibrillation) (HCC)    a. s/p DCCV in 2014; b. on Coumadin ; c. CHADS2VASc => 2 (HTN, vascular disease)   Paralysis (HCC)    due to dissection of aorta in 2004, lower extremities   Pneumonia    Significant Hospital  Events: Including procedures, antibiotic start and stop dates in addition to other pertinent events   6/29 rapid response was called on the floor due to lethargy, hypotension, started on Levophed and was transferred to ICU.  Urgent dialysis Remain on vasopressor support  Interim History / Subjective:  Overnight patient became short of breath, with increasing oxygen  requirement, currently on BiPAP Vasopressors requirement is going up, currently on 6 mics of Levophed  Patient's family is at bedside  Objective    Blood pressure 101/72, pulse (!) 103, temperature (!) 97.3 F (36.3 C), temperature source Axillary, resp. rate (!) 22, height 6' 3 (1.905 m), weight 61.4 kg, SpO2 100%.    Vent Mode: PSV;BIPAP FiO2 (%):  [60 %-100 %] 60 % PEEP:  [8 cmH20] 8 cmH20 Pressure Support:  [12 cmH20] 12 cmH20 Plateau Pressure:  [16 cmH20] 16 cmH20   Intake/Output Summary (Last 24 hours) at 10-26-2023 0806 Last data filed at 10/26/23 9352 Gross per 24 hour  Intake 1721.89 ml  Output --  Net 1721.89 ml   Filed Weights   09/28/23 1331 09/29/23 0844 10/01/23 0431  Weight: 64 kg 64.7 kg 61.4 kg   Examination: General: Acute on chronically ill-appearing middle-aged male, lying on the bed HEENT: [place]/AT, eyes anicteric.  moist mucus membranes.  On BiPAP Neuro: Lethargic, opens eyes with vocal stimuli, follows simple commands Chest: Bilateral basal crackles on no wheezes or rhonchi Heart: Regular rate and rhythm, no murmurs or gallops Abdomen: Soft, nontender, nondistended, bowel sounds present Extremities: Status  post left BKA, right lower extremity wrapped in dressing with Unna boot  Labs and images reviewed  Patient Lines/Drains/Airways Status     Active Line/Drains/Airways     Name Placement date Placement time Site Days   Peripheral IV 09/28/23 20 G Anterior;Left Forearm 09/28/23  1349  Forearm  6   Peripheral IV 10/01/23 Left Antecubital 10/01/23  --  Antecubital  3   Fistula / Graft Left  Upper arm Arteriovenous fistula 12/09/15  1057  Upper arm  2856   Fistula / Graft Right Upper arm --  --  Upper arm  --   Hemodialysis Catheter Right Subclavian Double-lumen 12/09/15  0900  Subclavian  2856   Hemodialysis Catheter Right Subclavian Double-lumen 03/09/16  0834  Subclavian  2765   Wound / Incision (Open or Dehisced) 04/28/16 Other (Comment) Arm Left 04/28/16  1443  Arm  2715   Wound 09/28/23 1955 Pressure Injury Buttocks Left Stage 2 -  Partial thickness loss of dermis presenting as a shallow open injury with a red, pink wound bed without slough. 09/28/23  1955  Buttocks  6   Wound 09/28/23 2017 Other (Comment) Leg Right 09/28/23  2017  Leg  6        Resolved problem list   Assessment and Plan  Acute on chronic hypoxic and hypercarbic respiratory failure in setting of pulmonary edema and volume overload At baseline patient is on 4 L oxygen , currently on BiPAP as patient started getting short of breath and hypoxic overnight Unable to tolerate hemodialysis due to hypotension Continue goals of care discussion  End-stage renal disease on hemodialysis Chronic hypotension on midodrine  Patient is on hemodialysis for over 20 years Now he is not tolerating hemodialysis or volume removal due to hypotension He is on high-dose midodrine  at home which was continued here Requiring vasopressor support with Levophed  Nephrology is following, recommend not a long-term candidate for dialysis considering intolerance caused by low blood pressure   Acute metabolic/uremic encephalopathy in the setting of hypercapnia, much improved Avoid sedation  Paroxysmal A-fib with RVR Demand cardiac ischemia Converted to sinus rhythm Continue amiodarone  for now  Continue Eliquis  for stroke prophylaxis   Cellulitis of RLE, calciphylaxis, and presumed osteomyelitis 1st thru 4th digits in setting of severe PAD Vascular surgery recommend continuing Unna boots On broad-spectrum antibiotics with Zyvox and  Fortaz  Blood cultures have been negative Continue wound care  Anemia of chronic disease Thrombocytopenia critical illness Platelet count and hemoglobin stable Monitor H&H and platelet count, watch for signs of bleeding  GOC Goals of care meeting at 9 AM  Best Practice (right click and Reselect all SmartList Selections daily)   Diet/type: Full liquid diet but currently n.p.o. due to being on BiPAP DVT prophylaxis Eliquis  Pressure ulcer(s): Please see nursing notes GI prophylaxis: N/A Lines: Dialysis Catheter Foley:  N/A Code Status:  full code Last date of multidisciplinary goals of care discussion: Goals of care meeting scheduled for today  Labs   CBC: Recent Labs  Lab 09/28/23 1402 09/29/23 0349 10/01/23 0126 10/01/23 1100 10/02/23 0915  WBC 7.3 8.8 13.0* 13.8*  --   NEUTROABS 6.1  --   --   --   --   HGB 11.1* 11.7* 12.0* 11.4* 12.2*  HCT 38.7* 42.0 41.6 38.9* 36.0*  MCV 96.8 98.6 96.7 96.8  --   PLT 127* 105* 118* 118*  --     Basic Metabolic Panel: Recent Labs  Lab 09/29/23 0349 09/30/23 0453 10/01/23 0126 10/01/23 0456 10/01/23  0830 10/01/23 1100 10/02/23 0915 10/03/23 1030  NA 143 138 136 135  --  138 131* 134*  K 5.3* 4.6 4.8 4.7  --  4.3 3.5 4.2  CL 101 96* 95* 94*  --  99  --  89*  CO2 17* 23 21* 23  --  23  --  22  GLUCOSE 83 65* 98 147* 194* 99  --  73  BUN 70* 43* 56* 58*  --  57*  --  38*  CREATININE 10.17* 6.87* 7.65* 7.84*  --  7.53*  --  5.83*  CALCIUM  9.1 9.5 9.1 8.7*  --  8.5*  --  8.9  MG  --   --   --   --   --  2.4  --   --   PHOS 9.7* 8.3*  --  8.6*  --   --   --  5.9*   GFR: Estimated Creatinine Clearance: 12.7 mL/min (A) (by C-G formula based on SCr of 5.83 mg/dL (H)). Recent Labs  Lab 09/28/23 1402 09/28/23 1407 09/28/23 1611 09/29/23 0349 10/01/23 0126 10/01/23 1100  WBC 7.3  --   --  8.8 13.0* 13.8*  LATICACIDVEN  --  3.2* 3.0*  --   --   --     Liver Function Tests: Recent Labs  Lab 09/28/23 1402  09/29/23 0349 09/30/23 0453 10/01/23 0456 10/01/23 1100 10/03/23 1030  AST 14*  --   --   --  23  --   ALT 9  --   --   --  12  --   ALKPHOS 230*  --   --   --  195*  --   BILITOT 1.0  --   --   --  0.9  --   PROT 7.2  --   --   --  6.0*  --   ALBUMIN  3.3* 3.0* 3.2* 2.9* 2.7* 3.1*   No results for input(s): LIPASE, AMYLASE in the last 168 hours. No results for input(s): AMMONIA in the last 168 hours.  ABG    Component Value Date/Time   PHART 7.321 (L) 10/02/2023 0915   PCO2ART 46.3 10/02/2023 0915   PO2ART 109 (H) 10/02/2023 0915   HCO3 24.1 10/02/2023 0915   TCO2 25 10/02/2023 0915   ACIDBASEDEF 2.0 10/02/2023 0915   O2SAT 98 10/02/2023 0915     Coagulation Profile: Recent Labs  Lab 09/28/23 1402 09/29/23 0349  INR 1.6* 4.2*    Cardiac Enzymes: No results for input(s): CKTOTAL, CKMB, CKMBINDEX, TROPONINI in the last 168 hours.  HbA1C: No results found for: HGBA1C  CBG: Recent Labs  Lab 10/03/23 2330 2023-10-21 0245 2023-10-21 0316 10/21/2023 0659 10-21-2023 0752  GLUCAP 85 63* 101* 91 73    The patient is critically ill due to osteomyelitis/acute on chronic respiratory failure with hypoxia.  Critical care was necessary to treat or prevent imminent or life-threatening deterioration.  Critical care was time spent personally by me on the following activities: development of treatment plan with patient and/or surrogate as well as nursing, discussions with consultants, evaluation of patient's response to treatment, examination of patient, obtaining history from patient or surrogate, ordering and performing treatments and interventions, ordering and review of laboratory studies, ordering and review of radiographic studies, pulse oximetry, re-evaluation of patient's condition and participation in multidisciplinary rounds.   During this encounter critical care time was devoted to patient care services described in this note for 38 minutes.     [place],  MD [facility] See Amion for pager If no response to pager, please call [phone] until 7pm After 7pm, Please call E-link [phone]

## 2023-11-03 NOTE — Progress Notes (Signed)
 Wasted full bag 50 mg of dilaudid  in stericycle with Marjorie Gunner, RN

## 2023-11-03 NOTE — Progress Notes (Signed)
 PT Cancellation Note  Patient Details Name: Corey [name] MRN: [id number] DOB: 05-03-1970   Cancelled Treatment:    Reason Eval/Treat Not Completed: Medical issues which prohibited therapy (Nursing agreed for PT to sign off as pt possibly end of life.  Please reorder if pt status changes.)   Corey [name] 10/19/2023, 8:53 AM Kaynan Klonowski M,PT [facility] [phone]

## 2023-11-03 DEATH — deceased
